# Patient Record
Sex: Male | Born: 1955
Health system: Southern US, Community
[De-identification: ages and names within clinical notes are randomized; demographics above are authoritative.]

## PROBLEM LIST (undated history)

## (undated) ENCOUNTER — Ambulatory Visit: Discharge status: Absent without leave | Payer: 59

## (undated) DIAGNOSIS — G8929 Other chronic pain: Secondary | ICD-10-CM

## (undated) DIAGNOSIS — M5030 Other cervical disc degeneration, unspecified cervical region: Secondary | ICD-10-CM

## (undated) DIAGNOSIS — E119 Type 2 diabetes mellitus without complications: Secondary | ICD-10-CM

## (undated) DIAGNOSIS — F17200 Nicotine dependence, unspecified, uncomplicated: Secondary | ICD-10-CM

## (undated) DIAGNOSIS — Z6831 Body mass index (BMI) 31.0-31.9, adult: Secondary | ICD-10-CM

## (undated) DIAGNOSIS — I509 Heart failure, unspecified: Secondary | ICD-10-CM

## (undated) DIAGNOSIS — I739 Peripheral vascular disease, unspecified: Secondary | ICD-10-CM

## (undated) DIAGNOSIS — I2510 Atherosclerotic heart disease of native coronary artery without angina pectoris: Secondary | ICD-10-CM

## (undated) DIAGNOSIS — E6609 Other obesity due to excess calories: Secondary | ICD-10-CM

## (undated) DIAGNOSIS — E785 Hyperlipidemia, unspecified: Secondary | ICD-10-CM

## (undated) DIAGNOSIS — I10 Essential (primary) hypertension: Secondary | ICD-10-CM

## (undated) DIAGNOSIS — C801 Malignant (primary) neoplasm, unspecified: Secondary | ICD-10-CM

## (undated) HISTORY — PX: BREAST SURGERY: SHX581

## (undated) HISTORY — PX: KNEE SURGERY: SHX244

## (undated) HISTORY — PX: CARDIAC CATHETERIZATION: SHX172

## (undated) HISTORY — DX: Heart failure, unspecified: I50.9

## (undated) HISTORY — DX: Atherosclerotic heart disease of native coronary artery without angina pectoris: I25.10

## (undated) MED FILL — KCl 20 MEQ/L (0.15%) in NaCl 0.9% Inj: INTRAVENOUS | Qty: 1000 | Status: AC

---

## 2004-03-07 ENCOUNTER — Ambulatory Visit (HOSPITAL_COMMUNITY): Admission: RE | Admit: 2004-03-07 | Discharge: 2004-03-07 | Payer: Self-pay | Admitting: Family Medicine

## 2014-11-12 ENCOUNTER — Other Ambulatory Visit (HOSPITAL_COMMUNITY): Payer: Self-pay | Admitting: Physician Assistant

## 2014-11-12 ENCOUNTER — Ambulatory Visit (HOSPITAL_COMMUNITY)
Admission: RE | Admit: 2014-11-12 | Discharge: 2014-11-12 | Disposition: A | Discharge status: Home / Self Care | Payer: 59 | Source: Ambulatory Visit | Attending: Physician Assistant | Admitting: Physician Assistant

## 2014-11-12 DIAGNOSIS — M940 Chondrocostal junction syndrome [Tietze]: Secondary | ICD-10-CM

## 2014-11-12 DIAGNOSIS — Z0001 Encounter for general adult medical examination with abnormal findings: Secondary | ICD-10-CM

## 2015-05-30 ENCOUNTER — Encounter (INDEPENDENT_AMBULATORY_CARE_PROVIDER_SITE_OTHER): Payer: Self-pay | Admitting: *Deleted

## 2016-09-07 DIAGNOSIS — Z0001 Encounter for general adult medical examination with abnormal findings: Secondary | ICD-10-CM | POA: Diagnosis not present

## 2016-09-07 DIAGNOSIS — Z1389 Encounter for screening for other disorder: Secondary | ICD-10-CM | POA: Diagnosis not present

## 2016-09-07 DIAGNOSIS — E782 Mixed hyperlipidemia: Secondary | ICD-10-CM | POA: Diagnosis not present

## 2016-09-07 DIAGNOSIS — Z23 Encounter for immunization: Secondary | ICD-10-CM | POA: Diagnosis not present

## 2016-09-08 DIAGNOSIS — Z0001 Encounter for general adult medical examination with abnormal findings: Secondary | ICD-10-CM | POA: Diagnosis not present

## 2016-09-08 DIAGNOSIS — Z1389 Encounter for screening for other disorder: Secondary | ICD-10-CM | POA: Diagnosis not present

## 2016-09-08 DIAGNOSIS — Z23 Encounter for immunization: Secondary | ICD-10-CM | POA: Diagnosis not present

## 2018-05-12 DIAGNOSIS — J019 Acute sinusitis, unspecified: Secondary | ICD-10-CM | POA: Diagnosis not present

## 2018-05-12 DIAGNOSIS — Z1389 Encounter for screening for other disorder: Secondary | ICD-10-CM | POA: Diagnosis not present

## 2018-05-12 DIAGNOSIS — E663 Overweight: Secondary | ICD-10-CM | POA: Diagnosis not present

## 2018-05-12 DIAGNOSIS — Z6828 Body mass index (BMI) 28.0-28.9, adult: Secondary | ICD-10-CM | POA: Diagnosis not present

## 2018-05-12 DIAGNOSIS — B349 Viral infection, unspecified: Secondary | ICD-10-CM | POA: Diagnosis not present

## 2018-06-09 DIAGNOSIS — E663 Overweight: Secondary | ICD-10-CM | POA: Diagnosis not present

## 2018-06-09 DIAGNOSIS — Z0000 Encounter for general adult medical examination without abnormal findings: Secondary | ICD-10-CM | POA: Diagnosis not present

## 2018-06-09 DIAGNOSIS — R7309 Other abnormal glucose: Secondary | ICD-10-CM | POA: Diagnosis not present

## 2018-06-10 DIAGNOSIS — R7309 Other abnormal glucose: Secondary | ICD-10-CM | POA: Diagnosis not present

## 2018-06-10 DIAGNOSIS — Z6829 Body mass index (BMI) 29.0-29.9, adult: Secondary | ICD-10-CM | POA: Diagnosis not present

## 2018-06-10 DIAGNOSIS — Z0000 Encounter for general adult medical examination without abnormal findings: Secondary | ICD-10-CM | POA: Diagnosis not present

## 2019-09-08 ENCOUNTER — Other Ambulatory Visit: Payer: Self-pay

## 2019-09-08 ENCOUNTER — Ambulatory Visit
Admission: EM | Admit: 2019-09-08 | Discharge: 2019-09-08 | Disposition: A | Discharge status: Home / Self Care | Payer: Commercial Managed Care - PPO | Attending: Emergency Medicine | Admitting: Emergency Medicine

## 2019-09-08 DIAGNOSIS — Z20822 Contact with and (suspected) exposure to covid-19: Secondary | ICD-10-CM

## 2019-09-08 LAB — POC SARS CORONAVIRUS 2 AG -  ED: SARS Coronavirus 2 Ag: NEGATIVE

## 2019-09-08 NOTE — ED Triage Notes (Signed)
Pt presents with headache and body aches that began yesterday  After positive exposure

## 2019-09-08 NOTE — ED Provider Notes (Signed)
[facility]    CSN: [id number] Arrival date & time: 09/08/19  1522      History   Chief Complaint Chief Complaint  Patient presents with  . Nasal Congestion    HPI REMER STANGO is a 64 y.o. male.    presents to the urgent care with a complaint of headache and body ache for the past 2 days.  Reported positive Covid exposure.  Denies sick exposure to COVID, flu or strep.  Denies recent travel.  Denies aggravating or alleviating symptoms.  Denies previous COVID infection.   Denies fever, chills, fatigue, nasal congestion, rhinorrhea, sore throat, cough, SOB, wheezing, chest pain, nausea, vomiting, changes in bowel or bladder habits.      History reviewed. No pertinent past medical history.  There are no problems to display for this patient.   History reviewed. No pertinent surgical history.     Home Medications    Prior to Admission medications   Not on File    Family History Family History  Problem Relation Age of Onset  . CAD Mother     Social History Social History   Tobacco Use  . Smoking status: Current Every Day Smoker    Packs/day: 1.00    Types: Cigarettes  . Smokeless tobacco: Never Used  Substance Use Topics  . Alcohol use: Not Currently  . Drug use: Not Currently     Allergies   Patient has no known allergies.   Review of Systems Review of Systems  Constitutional: Negative.   HENT: Negative.   Respiratory: Negative.   Cardiovascular: Negative.   Gastrointestinal: Negative.   Musculoskeletal:       Body ache  Neurological: Positive for headaches.  All other systems reviewed and are negative.    Physical Exam Triage Vital Signs ED Triage Vitals  Enc Vitals Group     BP      Pulse      Resp      Temp      Temp src      SpO2      Weight      Height      Head Circumference      Peak Flow      Pain Score      Pain Loc      Pain Edu?      Excl. in [place]?    No data found.  Updated Vital Signs BP (!) 164/90    Pulse 90   Temp 98.1 F (36.7 C)   Resp 20   SpO2 93%   Visual Acuity Right Eye Distance:   Left Eye Distance:   Bilateral Distance:    Right Eye Near:   Left Eye Near:    Bilateral Near:     Physical Exam Vitals and nursing note reviewed.  Constitutional:      General: He is not in acute distress.    Appearance: Normal appearance. He is normal weight. He is not ill-appearing or toxic-appearing.  HENT:     Head: Normocephalic.     Right Ear: Tympanic membrane, ear canal and external ear normal. There is no impacted cerumen.     Left Ear: Tympanic membrane, ear canal and external ear normal. There is no impacted cerumen.     Nose: Nose normal. No congestion.     Mouth/Throat:     Mouth: Mucous membranes are moist.     Pharynx: Oropharynx is clear. No oropharyngeal exudate or posterior oropharyngeal erythema.  Cardiovascular:  Rate and Rhythm: Normal rate and regular rhythm.     Pulses: Normal pulses.     Heart sounds: Normal heart sounds. No murmur.  Pulmonary:     Effort: Pulmonary effort is normal. No respiratory distress.     Breath sounds: Normal breath sounds. No wheezing or rhonchi.  Chest:     Chest wall: No tenderness.  Abdominal:     General: Abdomen is flat. Bowel sounds are normal. There is no distension.     Palpations: There is no mass.     Tenderness: There is no abdominal tenderness.  Skin:    Capillary Refill: Capillary refill takes less than 2 seconds.  Neurological:     General: No focal deficit present.     Mental Status: He is alert and oriented to person, place, and time.      UC Treatments / Results  Labs (all labs ordered are listed, but only abnormal results are displayed) Labs Reviewed  POC SARS CORONAVIRUS 2 AG -  ED    EKG   Radiology No results found.  Procedures Procedures (including critical care time)  Medications Ordered in UC Medications - No data to display  Initial Impression / Assessment and Plan / UC Course   I have reviewed the triage vital signs and the nursing notes.  Pertinent labs & imaging results that were available during my care of the patient were reviewed by me and considered in my medical decision making (see chart for details).    POC COVID-19 test was ordered.  Result was negative. [facility] COVID-19 test was ordered Advised patient to quarantine.   To go to ED for worsening of symptoms. Patient verbalized understanding plan of care.  Final Clinical Impressions(s) / UC Diagnoses   Final diagnoses:  Suspected COVID-19 virus infection     Discharge Instructions     COVID testing ordered.  It will take between 2-7 days for test results.  Someone will contact you regarding abnormal results.    In the meantime: You should remain isolated in your home for 10 days from symptom onset AND greater than 72 hours after symptoms resolution (absence of fever without the use of fever-reducing medication and improvement in respiratory symptoms), whichever is longer Get plenty of rest and push fluids Use medications daily for symptom relief Use OTC medications like ibuprofen or tylenol as needed fever or pain Call or go to the ED if you have any new or worsening symptoms such as fever, worsening cough, shortness of breath, chest tightness, chest pain, turning blue, changes in mental status, etc...     ED Prescriptions    None     PDMP not reviewed this encounter.   Emerson Monte, [place] 09/08/19 [phone]

## 2019-09-08 NOTE — Discharge Instructions (Addendum)
POC COVID-19 test was negative COVID testing ordered.  It will take between 2-7 days for test results.  Someone will contact you regarding abnormal results.    In the meantime: You should remain isolated in your home for 10 days from symptom onset AND greater than 72 hours after symptoms resolution (absence of fever without the use of fever-reducing medication and improvement in respiratory symptoms), whichever is longer Get plenty of rest and push fluids Use medications daily for symptom relief Use OTC medications like ibuprofen or tylenol as needed fever or pain Call or go to the ED if you have any new or worsening symptoms such as fever, worsening cough, shortness of breath, chest tightness, chest pain, turning blue, changes in mental status, etc..Marland Kitchen

## 2019-09-09 LAB — NOVEL CORONAVIRUS, NAA: SARS-CoV-2, NAA: NOT DETECTED

## 2019-09-12 ENCOUNTER — Telehealth (HOSPITAL_COMMUNITY): Payer: Self-pay | Admitting: Emergency Medicine

## 2019-09-12 NOTE — Telephone Encounter (Signed)
Pt called requesting covid test results. Results reported as negative.

## 2020-07-08 ENCOUNTER — Ambulatory Visit
Admission: EM | Admit: 2020-07-08 | Discharge: 2020-07-08 | Disposition: A | Discharge status: Home / Self Care | Payer: Commercial Managed Care - PPO

## 2020-07-08 DIAGNOSIS — Z1152 Encounter for screening for COVID-19: Secondary | ICD-10-CM

## 2020-07-08 DIAGNOSIS — J069 Acute upper respiratory infection, unspecified: Secondary | ICD-10-CM

## 2020-07-08 MED ORDER — BENZONATATE 100 MG PO CAPS: 100.0000 mg | ORAL_CAPSULE | Freq: Three times a day (TID) | ORAL | 0 refills | Status: DC | PRN

## 2020-07-08 MED ORDER — CETIRIZINE HCL 10 MG PO TABS: 10.0000 mg | ORAL_TABLET | Freq: Every day | ORAL | 0 refills | Status: DC

## 2020-07-08 MED ORDER — FLUTICASONE PROPIONATE 50 MCG/ACT NA SUSP: 1.0000 | Freq: Every day | NASAL | 0 refills | Status: DC

## 2020-07-08 MED ORDER — DEXAMETHASONE 4 MG PO TABS: 4.0000 mg | ORAL_TABLET | Freq: Every day | ORAL | 0 refills | Status: AC

## 2020-07-08 NOTE — ED Triage Notes (Signed)
Pt presents with cough and nasal congestion , pt also has c/o body aches

## 2020-07-08 NOTE — Discharge Instructions (Addendum)
COVID-19, RSV, flu A/B testing ordered.  It will take between 2-7 days for test results.  Someone will contact you regarding abnormal results.    In the meantime: You should remain isolated in your home for 10 days from symptom onset AND greater than 24 hours after symptoms resolution (absence of fever without the use of fever-reducing medication and improvement in respiratory symptoms), whichever is longer Get plenty of rest and push fluids Tessalon Perles prescribed for cough Zyrtec for nasal congestion, runny nose, and/or sore throat Flonase for nasal congestion and runny nose Use medications daily for symptom relief Use OTC medications like ibuprofen or tylenol as needed fever or pain Call or go to the ED if you have any new or worsening symptoms such as fever, worsening cough, shortness of breath, chest tightness, chest pain, turning blue, changes in mental status, etc..Marland Kitchen

## 2020-07-08 NOTE — ED Provider Notes (Signed)
[place]   [id number] 07/08/20 Arrival Time: 0712   CC: COVID symptoms  SUBJECTIVE: History from: patient and family.  Jonathan Keith is a 64 y.o. male who presents to the urgent care for complaint of cough, nasal congestion and body ache for the past few days.  Denies sick exposure to COVID, flu or strep.  Denies recent travel.  Has tried OTC medication without relief.  Denies aggravating factors.  Denies  previous symptoms in the past.   Denies fever, chills, fatigue, sinus pain, rhinorrhea, sore throat, SOB, wheezing, chest pain, nausea, changes in bowel or bladder habits.    ROS: As per HPI.  All other pertinent ROS negative.     History reviewed. No pertinent past medical history. History reviewed. No pertinent surgical history. No Known Allergies No current facility-administered medications on file prior to encounter.   Current Outpatient Medications on File Prior to Encounter  Medication Sig Dispense Refill   metFORMIN (GLUCOPHAGE-XR) 500 MG 24 hr tablet Take 500 mg by mouth daily.     pravastatin (PRAVACHOL) 20 MG tablet Take 20 mg by mouth daily.     Social History   Socioeconomic History   Marital status: Married    Spouse name: Not on file   Number of children: Not on file   Years of education: Not on file   Highest education level: Not on file  Occupational History   Not on file  Tobacco Use   Smoking status: Current Every Day Smoker    Packs/day: 1.00    Types: Cigarettes   Smokeless tobacco: Never Used  Substance and Sexual Activity   Alcohol use: Not Currently   Drug use: Not Currently   Sexual activity: Not on file  Other Topics Concern   Not on file  Social History Narrative   Not on file   Social Determinants of Health   Financial Resource Strain:    Difficulty of Paying Living Expenses: Not on file  Food Insecurity:    Worried About [place] in the Last Year: Not on file   Ran Out of Food in the  Last Year: Not on file  Transportation Needs:    Lack of Transportation (Medical): Not on file   Lack of Transportation (Non-Medical): Not on file  Physical Activity:    Days of Exercise per Week: Not on file   Minutes of Exercise per Session: Not on file  Stress:    Feeling of Stress : Not on file  Social Connections:    Frequency of Communication with Friends and Family: Not on file   Frequency of Social Gatherings with Friends and Family: Not on file   Attends Religious Services: Not on file   Active Member of Clubs or Organizations: Not on file   Attends Archivist Meetings: Not on file   Marital Status: Not on file  Intimate Partner Violence:    Fear of Current or Ex-Partner: Not on file   Emotionally Abused: Not on file   Physically Abused: Not on file   Sexually Abused: Not on file   Family History  Problem Relation Age of Onset   CAD Mother     OBJECTIVE:  Vitals:   07/08/20 0828  BP: (!) 146/83  Pulse: 84  Resp: 16  Temp: 97.8 F (36.6 C)  SpO2: 90%     General appearance: alert; appears fatigued, but nontoxic; speaking in full sentences and tolerating own secretions HEENT: NCAT; Ears: EACs clear, TMs pearly  gray; Eyes: PERRL.  EOM grossly intact. Sinuses: nontender; Nose: nares patent without rhinorrhea, Throat: oropharynx clear, tonsils non erythematous or enlarged, uvula midline  Neck: supple without LAD Lungs: unlabored respirations, symmetrical air entry; cough: moderate; no respiratory distress; CTAB Heart: regular rate and rhythm.  Radial pulses 2+ symmetrical bilaterally Skin: warm and dry Psychological: alert and cooperative; normal mood and affect  LABS:  No results found for this or any previous visit (from the past 24 hour(s)).   ASSESSMENT & PLAN:  1. Encounter for screening for COVID-19   2. URI with cough and congestion     Meds ordered this encounter  Medications   benzonatate (TESSALON) 100 MG capsule     Sig: Take 1 capsule (100 mg total) by mouth 3 (three) times daily as needed for cough.    Dispense:  30 capsule    Refill:  0   fluticasone (FLONASE) 50 MCG/ACT nasal spray    Sig: Place 1 spray into both nostrils daily for 14 days.    Dispense:  16 g    Refill:  0   cetirizine (ZYRTEC ALLERGY) 10 MG tablet    Sig: Take 1 tablet (10 mg total) by mouth daily.    Dispense:  30 tablet    Refill:  0   dexamethasone (DECADRON) 4 MG tablet    Sig: Take 1 tablet (4 mg total) by mouth daily for 7 days.    Dispense:  7 tablet    Refill:  0    Discharge Instructions    COVID-19, RSV, flu A/B testing ordered.  It will take between 2-7 days for test results.  Someone will contact you regarding abnormal results.    In the meantime: You should remain isolated in your home for 10 days from symptom onset AND greater than 24 hours after symptoms resolution (absence of fever without the use of fever-reducing medication and improvement in respiratory symptoms), whichever is longer Get plenty of rest and push fluids Tessalon Perles prescribed for cough Zyrtec for nasal congestion, runny nose, and/or sore throat Flonase for nasal congestion and runny nose Use medications daily for symptom relief Use OTC medications like ibuprofen or tylenol as needed fever or pain Call or go to the ED if you have any new or worsening symptoms such as fever, worsening cough, shortness of breath, chest tightness, chest pain, turning blue, changes in mental status, etc...   Reviewed expectations re: course of current medical issues. Questions answered. Outlined signs and symptoms indicating need for more acute intervention. Patient verbalized understanding. After Visit Summary given.         Emerson Monte, [place] 07/08/20 [phone]

## 2020-07-10 LAB — COVID-19, FLU A+B AND RSV
Influenza A, NAA: NOT DETECTED
Influenza B, NAA: NOT DETECTED
RSV, NAA: NOT DETECTED
SARS-CoV-2, NAA: NOT DETECTED

## 2020-09-30 ENCOUNTER — Other Ambulatory Visit: Payer: Self-pay | Admitting: Physician Assistant

## 2020-09-30 ENCOUNTER — Other Ambulatory Visit (HOSPITAL_COMMUNITY): Payer: Self-pay | Admitting: Physician Assistant

## 2020-09-30 DIAGNOSIS — M79604 Pain in right leg: Secondary | ICD-10-CM

## 2020-09-30 DIAGNOSIS — M79605 Pain in left leg: Secondary | ICD-10-CM

## 2020-10-10 ENCOUNTER — Ambulatory Visit (HOSPITAL_COMMUNITY)
Admission: RE | Admit: 2020-10-10 | Discharge: 2020-10-10 | Disposition: A | Discharge status: Home / Self Care | Payer: Commercial Managed Care - PPO | Source: Ambulatory Visit | Attending: Physician Assistant | Admitting: Physician Assistant

## 2020-10-10 DIAGNOSIS — M79604 Pain in right leg: Secondary | ICD-10-CM

## 2020-10-10 DIAGNOSIS — M79605 Pain in left leg: Secondary | ICD-10-CM | POA: Diagnosis present

## 2020-10-11 ENCOUNTER — Ambulatory Visit (HOSPITAL_COMMUNITY)
Admission: RE | Admit: 2020-10-11 | Discharge: 2020-10-11 | Disposition: A | Discharge status: Home / Self Care | Payer: Commercial Managed Care - PPO | Source: Ambulatory Visit | Attending: Physician Assistant | Admitting: Physician Assistant

## 2020-10-11 ENCOUNTER — Other Ambulatory Visit: Payer: Self-pay

## 2020-10-11 DIAGNOSIS — M79605 Pain in left leg: Secondary | ICD-10-CM | POA: Diagnosis present

## 2020-10-11 DIAGNOSIS — M79604 Pain in right leg: Secondary | ICD-10-CM | POA: Diagnosis not present

## 2020-10-21 ENCOUNTER — Other Ambulatory Visit: Payer: Self-pay

## 2020-10-21 ENCOUNTER — Ambulatory Visit: Payer: Commercial Managed Care - PPO | Admitting: Vascular Surgery

## 2020-10-21 ENCOUNTER — Encounter: Payer: Self-pay | Admitting: Vascular Surgery

## 2020-10-21 VITALS — BP 173/97 | HR 99 | Temp 97.9°F | Resp 16 | Ht 73.0 in | Wt 206.0 lb

## 2020-10-21 DIAGNOSIS — I70213 Atherosclerosis of native arteries of extremities with intermittent claudication, bilateral legs: Secondary | ICD-10-CM

## 2020-10-21 NOTE — Progress Notes (Signed)
[facility] [place]  Patient name: Jonathan Keith MRN: [id number] DOB: [birth year] Sex: male  REASON FOR CONSULT: Evaluation of limiting intermittent claudication both lower extremities  HPI: Jonathan Keith is a 65 y.o. male, who is here today for evaluation.  He is here today with his wife.  He reports a progressively severe intermittent claudication over the past year.  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  He reports this is become very difficult for him to do his job related to this.  He reports that his legs "lock up" with prolonged walking and he has to stop to get relief and then walk again.  He does not have any lower extremity tissue loss.  He reports that the pain is mainly in his calves but also can extend into his thighs and buttocks if he progressively pushes his walking.  He does report some nocturnal cramping as well and explained that this is not related arterial insufficiency.  He is a long-term cigarette smoker.  He reports that he is attempted to stop on many occasions with multiple modalities but has been unsuccessful  History reviewed. No pertinent past medical history.  Family History  Problem Relation Age of Onset  . CAD Mother     SOCIAL HISTORY: Social History   Socioeconomic History  . Marital status: Married    Spouse name: Not on file  . Number of children: Not on file  . Years of education: Not on file  . Highest education level: Not on file  Occupational History  . Not on file  Tobacco Use  . Smoking status: Current Every Day Smoker    Packs/day: 1.00    Types: Cigarettes  . Smokeless tobacco: Never Used  Substance and Sexual Activity  . Alcohol use: Not Currently  . Drug use: Not Currently  . Sexual activity: Not on file  Other Topics Concern  . Not on file  Social History Narrative  . Not on file   Social Determinants of Health   Financial Resource  Strain: Not on file  Food Insecurity: Not on file  Transportation Needs: Not on file  Physical Activity: Not on file  Stress: Not on file  Social Connections: Not on file  Intimate Partner Violence: Not on file    No Known Allergies  Current Outpatient Medications  Medication Sig Dispense Refill  . Calcium Carb-Cholecalciferol (SUPER CALCIUM 600 + D 400 PO) Take by mouth.    . dextromethorphan (TUSSIN COUGH) 15 MG/5ML syrup Take 10 mLs by mouth daily.    . metFORMIN (GLUCOPHAGE-XR) 500 MG 24 hr tablet Take 500 mg by mouth daily.    . Omega-3 Fatty Acids (FISH OIL) 1000 MG CPDR Take 2 g by mouth in the morning and at bedtime.    . pravastatin (PRAVACHOL) 20 MG tablet Take 20 mg by mouth daily.     No current facility-administered medications for this visit.    REVIEW OF SYSTEMS:  [X]  denotes positive finding, [ ]  denotes negative finding Cardiac  Comments:  Chest pain or chest pressure:    Shortness of breath upon exertion:    Short of breath when lying flat:    Irregular heart rhythm:        Vascular    Pain in calf, thigh, or hip brought on by ambulation: x   Pain in feet at night that wakes you up from your sleep:  x   Blood  clot in your veins:    Leg swelling:         Pulmonary    Oxygen at home:    Productive cough:     Wheezing:         Neurologic    Sudden weakness in arms or legs:     Sudden numbness in arms or legs:     Sudden onset of difficulty speaking or slurred speech:    Temporary loss of vision in one eye:     Problems with dizziness:         Gastrointestinal    Blood in stool:     Vomited blood:         Genitourinary    Burning when urinating:     Blood in urine:        Psychiatric    Major depression:         Hematologic    Bleeding problems:    Problems with blood clotting too easily:        Skin    Rashes or ulcers:        Constitutional    Fever or chills:      PHYSICAL EXAM: Vitals:   10/21/20 0831  BP: (!) 173/97  Pulse:  99  Resp: 16  Temp: 97.9 F (36.6 C)  TempSrc: Other (Comment)  SpO2: 95%  Weight: 206 lb (93.4 kg)  Height: 6\' 1"  (1.854 m)    GENERAL: The patient is a well-nourished male, in no acute distress. The vital signs are documented above. CARDIOVASCULAR: 2+ radial pulses bilaterally.  1+ femoral pulses bilaterally.  Absent popliteal and distal pulses bilaterally. PULMONARY: There is good air exchange  MUSCULOSKELETAL: There are no major deformities or cyanosis. NEUROLOGIC: No focal weakness or paresthesias are detected. SKIN: There are no ulcers or rashes noted. PSYCHIATRIC: The patient has a normal affect.  DATA:  Noninvasive studies from 10/10/2020 revealed the lower extremity duplex showing no evidence of DVT  Studies from 10/11/2020 were reviewed showing ankle arm index of 0.85 on the right and 0.70 on the left  MEDICAL ISSUES I discussed these findings and his history with the patient.  Explained that he does have a very straightforward claudication type symptoms related to his arterial insufficiency.  Explained that his symptoms are worse than we would typically see at his level of ankle arm indices but this is quite variable.  I explained that this is not limb threatening to him.  He is very limited to this both in his work and regular outside of work walking ability.  Explained the neck step would be arteriography for further evaluation.  Explained that if he had aortoiliac occlusive disease we would recommend stenting.  I explained the potential use of stenting for focal disease in his superficial femoral artery but explained the very limited long-term durability for extensive stenting in the superficial femoral artery level.  I did discuss femoral to popliteal bypass but certainly would reserve this for severe claudication or tissue loss.  I explained that blockages are related to his cigarette smoking and that if he was able to stop he would be able to walk further and also reduce his risk  for progressive disease.  He wishes to proceed with outpatient arteriography at [facility] and treatment if appropriate.  If endovascular treatment is not recommended, will discuss bypass surgery but in all likelihood would defer this.  Rosetta Posner, MD FACS [facility] Tel [phone]  [phone] Pager [phone]  Note: Portions of this report may have been transcribed using voice recognition software.  Every effort has been made to ensure accuracy; however, inadvertent computerized transcription errors may still be present.

## 2020-11-19 ENCOUNTER — Telehealth: Payer: Self-pay

## 2020-11-19 NOTE — Telephone Encounter (Signed)
Multiple unsuccessful attempts made to reach pt to schedule for an aortogram with bilateral runoff, possible intervention. Will mail pt an UTR letter to contact office to schedule.

## 2021-02-14 NOTE — Telephone Encounter (Signed)
Left message for patient to return call to inquire on intentions for scheduling procedure.

## 2021-02-24 ENCOUNTER — Encounter: Payer: Self-pay | Admitting: Internal Medicine

## 2021-02-24 ENCOUNTER — Other Ambulatory Visit: Payer: Self-pay

## 2021-02-24 ENCOUNTER — Encounter (HOSPITAL_COMMUNITY): Payer: Self-pay | Admitting: *Deleted

## 2021-02-24 ENCOUNTER — Emergency Department (HOSPITAL_COMMUNITY): Payer: Commercial Managed Care - PPO

## 2021-02-24 ENCOUNTER — Inpatient Hospital Stay (HOSPITAL_COMMUNITY)
Admission: EM | Admit: 2021-02-24 | Discharge: 2021-02-28 | DRG: 435 | Disposition: A | Discharge status: Home / Self Care | Payer: Commercial Managed Care - PPO | Source: Ambulatory Visit | Attending: Internal Medicine | Admitting: Internal Medicine

## 2021-02-24 DIAGNOSIS — E1165 Type 2 diabetes mellitus with hyperglycemia: Secondary | ICD-10-CM | POA: Diagnosis present

## 2021-02-24 DIAGNOSIS — E119 Type 2 diabetes mellitus without complications: Secondary | ICD-10-CM

## 2021-02-24 DIAGNOSIS — K8050 Calculus of bile duct without cholangitis or cholecystitis without obstruction: Secondary | ICD-10-CM

## 2021-02-24 DIAGNOSIS — Z20822 Contact with and (suspected) exposure to covid-19: Secondary | ICD-10-CM | POA: Diagnosis present

## 2021-02-24 DIAGNOSIS — K831 Obstruction of bile duct: Secondary | ICD-10-CM | POA: Diagnosis present

## 2021-02-24 DIAGNOSIS — C221 Intrahepatic bile duct carcinoma: Principal | ICD-10-CM | POA: Diagnosis present

## 2021-02-24 DIAGNOSIS — Z79899 Other long term (current) drug therapy: Secondary | ICD-10-CM | POA: Diagnosis not present

## 2021-02-24 DIAGNOSIS — I10 Essential (primary) hypertension: Secondary | ICD-10-CM | POA: Diagnosis present

## 2021-02-24 DIAGNOSIS — E118 Type 2 diabetes mellitus with unspecified complications: Secondary | ICD-10-CM | POA: Insufficient documentation

## 2021-02-24 DIAGNOSIS — R7989 Other specified abnormal findings of blood chemistry: Secondary | ICD-10-CM | POA: Diagnosis not present

## 2021-02-24 DIAGNOSIS — R17 Unspecified jaundice: Secondary | ICD-10-CM | POA: Diagnosis not present

## 2021-02-24 DIAGNOSIS — Z8249 Family history of ischemic heart disease and other diseases of the circulatory system: Secondary | ICD-10-CM

## 2021-02-24 DIAGNOSIS — K2960 Other gastritis without bleeding: Secondary | ICD-10-CM | POA: Diagnosis present

## 2021-02-24 DIAGNOSIS — F17200 Nicotine dependence, unspecified, uncomplicated: Secondary | ICD-10-CM | POA: Diagnosis present

## 2021-02-24 DIAGNOSIS — F17210 Nicotine dependence, cigarettes, uncomplicated: Secondary | ICD-10-CM | POA: Diagnosis present

## 2021-02-24 DIAGNOSIS — Z7984 Long term (current) use of oral hypoglycemic drugs: Secondary | ICD-10-CM | POA: Diagnosis not present

## 2021-02-24 DIAGNOSIS — E6609 Other obesity due to excess calories: Secondary | ICD-10-CM | POA: Insufficient documentation

## 2021-02-24 DIAGNOSIS — E782 Mixed hyperlipidemia: Secondary | ICD-10-CM | POA: Insufficient documentation

## 2021-02-24 DIAGNOSIS — E785 Hyperlipidemia, unspecified: Secondary | ICD-10-CM | POA: Diagnosis present

## 2021-02-24 DIAGNOSIS — R030 Elevated blood-pressure reading, without diagnosis of hypertension: Secondary | ICD-10-CM | POA: Insufficient documentation

## 2021-02-24 HISTORY — DX: Nicotine dependence, unspecified, uncomplicated: F17.200

## 2021-02-24 HISTORY — DX: Other obesity due to excess calories: Z68.31

## 2021-02-24 HISTORY — DX: Essential (primary) hypertension: I10

## 2021-02-24 HISTORY — DX: Other cervical disc degeneration, unspecified cervical region: M50.30

## 2021-02-24 HISTORY — DX: Hyperlipidemia, unspecified: E78.5

## 2021-02-24 HISTORY — DX: Peripheral vascular disease, unspecified: I73.9

## 2021-02-24 HISTORY — DX: Type 2 diabetes mellitus without complications: E11.9

## 2021-02-24 HISTORY — DX: Other obesity due to excess calories: E66.09

## 2021-02-24 HISTORY — DX: Other chronic pain: G89.29

## 2021-02-24 LAB — COMPREHENSIVE METABOLIC PANEL
ALT: 230 U/L — ABNORMAL HIGH (ref 0–44)
AST: 111 U/L — ABNORMAL HIGH (ref 15–41)
Albumin: 3.2 g/dL — ABNORMAL LOW (ref 3.5–5.0)
Alkaline Phosphatase: 355 U/L — ABNORMAL HIGH (ref 38–126)
Anion gap: 8 (ref 5–15)
BUN: 20 mg/dL (ref 8–23)
CO2: 22 mmol/L (ref 22–32)
Calcium: 9.4 mg/dL (ref 8.9–10.3)
Chloride: 100 mmol/L (ref 98–111)
Creatinine, Ser: 0.57 mg/dL — ABNORMAL LOW (ref 0.61–1.24)
GFR, Estimated: >60 mL/min (ref >60)
Glucose, Bld: 194 mg/dL — ABNORMAL HIGH (ref 70–99)
Potassium: 4.1 mmol/L (ref 3.5–5.1)
Sodium: 130 mmol/L — ABNORMAL LOW (ref 135–145)
Total Bilirubin: 18 mg/dL — ABNORMAL HIGH (ref 0.3–1.2)
Total Protein: 7.1 g/dL (ref 6.5–8.1)

## 2021-02-24 LAB — CBC WITH DIFFERENTIAL/PLATELET
Abs Immature Granulocytes: 0.03 10*3/uL (ref 0.00–0.07)
Basophils Absolute: 0.1 10*3/uL (ref 0.0–0.1)
Basophils Relative: 1 %
Eosinophils Absolute: 0.4 10*3/uL (ref 0.0–0.5)
Eosinophils Relative: 6 %
HCT: 39.5 % (ref 39.0–52.0)
Hemoglobin: 13.3 g/dL (ref 13.0–17.0)
Immature Granulocytes: 0 %
Lymphocytes Relative: 27 %
Lymphs Abs: 2.1 10*3/uL (ref 0.7–4.0)
MCH: 31.6 pg (ref 26.0–34.0)
MCHC: 33.7 g/dL (ref 30.0–36.0)
MCV: 93.8 fL (ref 80.0–100.0)
Monocytes Absolute: 0.8 10*3/uL (ref 0.1–1.0)
Monocytes Relative: 11 %
Neutro Abs: 4.3 10*3/uL (ref 1.7–7.7)
Neutrophils Relative %: 55 %
Platelets: 183 10*3/uL (ref 150–400)
RBC: 4.21 MIL/uL — ABNORMAL LOW (ref 4.22–5.81)
RDW: 14.2 % (ref 11.5–15.5)
WBC: 7.7 10*3/uL (ref 4.0–10.5)
nRBC: 0 % (ref 0.0–0.2)

## 2021-02-24 LAB — I-STAT CHEM 8, ED
BUN: 19 mg/dL (ref 8–23)
Calcium, Ion: 1.22 mmol/L (ref 1.15–1.40)
Chloride: 105 mmol/L (ref 98–111)
Creatinine, Ser: 0.9 mg/dL (ref 0.61–1.24)
Glucose, Bld: 196 mg/dL — ABNORMAL HIGH (ref 70–99)
HCT: 44 % (ref 39.0–52.0)
Hemoglobin: 15 g/dL (ref 13.0–17.0)
Potassium: 4.2 mmol/L (ref 3.5–5.1)
Sodium: 135 mmol/L (ref 135–145)
TCO2: 22 mmol/L (ref 22–32)

## 2021-02-24 LAB — HIV ANTIBODY (ROUTINE TESTING W REFLEX): HIV Screen 4th Generation wRfx: NONREACTIVE

## 2021-02-24 LAB — RESP PANEL BY RT-PCR (FLU A&B, COVID) ARPGX2
Influenza A by PCR: NEGATIVE
Influenza B by PCR: NEGATIVE
SARS Coronavirus 2 by RT PCR: NEGATIVE

## 2021-02-24 LAB — GLUCOSE, CAPILLARY: Glucose-Capillary: 258 mg/dL — ABNORMAL HIGH (ref 70–99)

## 2021-02-24 LAB — LIPASE, BLOOD: Lipase: 34 U/L (ref 11–51)

## 2021-02-24 LAB — HEMOGLOBIN A1C
Hgb A1c MFr Bld: 8.7 % — ABNORMAL HIGH (ref 4.8–5.6)
Mean Plasma Glucose: 202.99 mg/dL

## 2021-02-24 MED ORDER — PIPERACILLIN-TAZOBACTAM 3.375 G IVPB
3.3750 g | Freq: Three times a day (TID) | INTRAVENOUS | Status: DC
  Administered 2021-02-24 – 2021-02-28 (×11): 3.375 g via INTRAVENOUS
  Filled 2021-02-24 (×11): qty 50

## 2021-02-24 MED ORDER — INSULIN ASPART 100 UNIT/ML IJ SOLN
0.0000 [IU] | Freq: Every day | INTRAMUSCULAR | Status: DC
  Administered 2021-02-24 – 2021-02-25 (×2): 3 [IU] via SUBCUTANEOUS
  Administered 2021-02-27: 2 [IU] via SUBCUTANEOUS

## 2021-02-24 MED ORDER — ONDANSETRON HCL 4 MG/2ML IJ SOLN: 4.0000 mg | Freq: Four times a day (QID) | INTRAMUSCULAR | Status: DC | PRN

## 2021-02-24 MED ORDER — INSULIN ASPART 100 UNIT/ML IJ SOLN
0.0000 [IU] | Freq: Three times a day (TID) | INTRAMUSCULAR | Status: DC
  Administered 2021-02-25 – 2021-02-26 (×5): 3 [IU] via SUBCUTANEOUS
  Administered 2021-02-27: 2 [IU] via SUBCUTANEOUS
  Administered 2021-02-27: 4 [IU] via SUBCUTANEOUS
  Administered 2021-02-28: 5 [IU] via SUBCUTANEOUS

## 2021-02-24 MED ORDER — ACETAMINOPHEN 650 MG RE SUPP: 650.0000 mg | Freq: Four times a day (QID) | RECTAL | Status: DC | PRN

## 2021-02-24 MED ORDER — OXYCODONE HCL 5 MG PO TABS: 5.0000 mg | ORAL_TABLET | ORAL | Status: DC | PRN

## 2021-02-24 MED ORDER — SODIUM CHLORIDE 0.9 % IV SOLN
1.0000 g | Freq: Once | INTRAVENOUS | Status: AC
  Administered 2021-02-24: 1 g via INTRAVENOUS
  Filled 2021-02-24: qty 10

## 2021-02-24 MED ORDER — PIPERACILLIN-TAZOBACTAM 3.375 G IVPB: 3.3750 g | Freq: Three times a day (TID) | INTRAVENOUS | Status: DC

## 2021-02-24 MED ORDER — MORPHINE SULFATE (PF) 2 MG/ML IV SOLN: 2.0000 mg | INTRAVENOUS | Status: DC | PRN

## 2021-02-24 MED ORDER — ACETAMINOPHEN 325 MG PO TABS
650.0000 mg | ORAL_TABLET | Freq: Four times a day (QID) | ORAL | Status: DC | PRN
  Administered 2021-02-24: 650 mg via ORAL
  Filled 2021-02-24: qty 2

## 2021-02-24 MED ORDER — LACTATED RINGERS IV SOLN: INTRAVENOUS | Status: DC

## 2021-02-24 MED ORDER — HYDRALAZINE HCL 20 MG/ML IJ SOLN: 10.0000 mg | INTRAMUSCULAR | Status: DC | PRN

## 2021-02-24 MED ORDER — ONDANSETRON 4 MG PO TBDP: 4.0000 mg | ORAL_TABLET | Freq: Four times a day (QID) | ORAL | Status: DC | PRN

## 2021-02-24 MED ORDER — IOHEXOL 300 MG/ML  SOLN
100.0000 mL | Freq: Once | INTRAMUSCULAR | Status: AC | PRN
  Administered 2021-02-24: 100 mL via INTRAVENOUS

## 2021-02-24 NOTE — ED Provider Notes (Signed)
5:35 PM-received call back from GI, Dr. Abbey Chatters.  He states that his service will be able to do an ERCP, tomorrow morning.  He recommends admit to hospitalist.  He recommends starting Rocephin for possible cholecystitis.  5:36 PM-Consult complete with hospitalist. Patient case explained and discussed.  She agrees to admit patient for further evaluation and treatment. Call ended at 6:05 PM   Daleen Bo, MD 02/24/21 1815

## 2021-02-24 NOTE — ED Provider Notes (Signed)
[facility] EMERGENCY DEPARTMENT Provider Note   CSN: [id number] Arrival date & time: 02/24/21  1128     History Chief Complaint  Patient presents with   Jaundice    Jonathan Keith is a 65 y.o. male.  Patient complains of weakness for 2 weeks and   he saw his doctor today who sent him to the emergency department because of jaundice called his family doctor today who stated that he is got jaundice in his eyes  The history is provided by the patient and medical records. No language interpreter was used.  Weakness Severity:  Moderate Onset quality:  Sudden Timing:  Constant Progression:  Worsening Chronicity:  New Context: not alcohol use   Relieved by:  Nothing Worsened by:  Nothing Associated symptoms: no abdominal pain, no chest pain, no cough, no diarrhea, no frequency, no headaches and no seizures       History reviewed. No pertinent past medical history.  There are no problems to display for this patient.   Past Surgical History:  Procedure Laterality Date   BREAST SURGERY Left    KNEE SURGERY Left    x2       Family History  Problem Relation Age of Onset   CAD Mother     Social History   Tobacco Use   Smoking status: Every Day    Packs/day: 1.00    Types: Cigarettes   Smokeless tobacco: Never  Vaping Use   Vaping Use: Never used  Substance Use Topics   Alcohol use: Not Currently    Comment: occasionally   Drug use: Not Currently    Home Medications Prior to Admission medications   Medication Sig Start Date End Date Taking? Authorizing Provider  Calcium Carb-Cholecalciferol (SUPER CALCIUM 600 + D 400 PO) Take by mouth.    [provider]  dextromethorphan (TUSSIN COUGH) 15 MG/5ML syrup Take 10 mLs by mouth daily.    [provider]  metFORMIN (GLUCOPHAGE-XR) 500 MG 24 hr tablet Take 500 mg by mouth daily. 06/16/20   [provider]  Omega-3 Fatty Acids (FISH OIL) 1000 MG CPDR Take 2 g by mouth in the morning and at  bedtime.    [provider]  pravastatin (PRAVACHOL) 20 MG tablet Take 20 mg by mouth daily. 06/28/20   [provider]    Allergies    Patient has no known allergies.  Review of Systems   Review of Systems  Constitutional:  Negative for appetite change and fatigue.  HENT:  Negative for congestion, ear discharge and sinus pressure.   Eyes:  Negative for discharge.  Respiratory:  Negative for cough.   Cardiovascular:  Negative for chest pain.  Gastrointestinal:  Negative for abdominal pain and diarrhea.  Genitourinary:  Negative for frequency and hematuria.  Musculoskeletal:  Negative for back pain.  Skin:  Negative for rash.  Neurological:  Positive for weakness. Negative for seizures and headaches.  Psychiatric/Behavioral:  Negative for hallucinations.    Physical Exam Updated Vital Signs BP 139/85   Pulse 86   Temp 98.3 F (36.8 C) (Oral)   Resp 18   Ht 5' 11.5" (1.816 m)   Wt 104.8 kg   SpO2 100%   BMI 31.77 kg/m   Physical Exam Vitals and nursing note reviewed.  Constitutional:      Appearance: He is well-developed.  HENT:     Head: Normocephalic.     Nose: Nose normal.  Eyes:     General: No scleral icterus.  Comments: Sclera icteric  Neck:     Thyroid: No thyromegaly.  Cardiovascular:     Rate and Rhythm: Normal rate and regular rhythm.     Heart sounds: No murmur heard.   No friction rub. No gallop.  Pulmonary:     Breath sounds: No stridor. No wheezing or rales.  Chest:     Chest wall: No tenderness.  Abdominal:     General: There is no distension.     Tenderness: There is no abdominal tenderness. There is no rebound.  Musculoskeletal:        General: Normal range of motion.     Cervical back: Neck supple.  Lymphadenopathy:     Cervical: No cervical adenopathy.  Skin:    Coloration: Skin is jaundiced.     Findings: No erythema or rash.  Neurological:     Mental Status: He is alert and oriented to person, place, and time.      Motor: No abnormal muscle tone.     Coordination: Coordination normal.  Psychiatric:        Behavior: Behavior normal.    ED Results / Procedures / Treatments   Labs (all labs ordered are listed, but only abnormal results are displayed) Labs Reviewed  CBC WITH DIFFERENTIAL/PLATELET - Abnormal; Notable for the following components:      Result Value   RBC 4.21 (*)    All other components within normal limits  COMPREHENSIVE METABOLIC PANEL - Abnormal; Notable for the following components:   Sodium 130 (*)    Glucose, Bld 194 (*)    Creatinine, Ser 0.57 (*)    Albumin 3.2 (*)    AST 111 (*)    ALT 230 (*)    Alkaline Phosphatase 355 (*)    Total Bilirubin 18.0 (*)    All other components within normal limits  I-STAT CHEM 8, ED - Abnormal; Notable for the following components:   Glucose, Bld 196 (*)    All other components within normal limits  LIPASE, BLOOD    EKG None  Radiology CT ABDOMEN PELVIS W CONTRAST  Result Date: 02/24/2021 CLINICAL DATA:  Abdominal distension, weakness, jaundice EXAM: CT ABDOMEN AND PELVIS WITH CONTRAST TECHNIQUE: Multidetector CT imaging of the abdomen and pelvis was performed using the standard protocol following bolus administration of intravenous contrast. CONTRAST:  147mL OMNIPAQUE IOHEXOL 300 MG/ML  SOLN COMPARISON:  None. FINDINGS: Lower chest: No acute pleural or parenchymal lung disease. Minimal bilateral lower lobe bronchiectasis. Hepatobiliary: There is marked intrahepatic and extrahepatic biliary duct dilation, with common bile duct measuring up to 10 mm in size. There are multiple rounded areas of high attenuation within the common hepatic duct and common bile duct, reference images 22, 23, 26. Largest area measures 6 mm. The gallbladder is moderately distended. Circumferential wall thickening and enhancement is seen within the mid aspect of the gallbladder. This irregular area of gallbladder wall thickening measures up to 7 mm. Differential  includes localized inflammatory change versus neoplasm. Pancreas: The pancreas enhances normally. Mild fatty atrophy of the pancreatic body and tail. No surrounding inflammatory changes. Spleen: Normal in size without focal abnormality. Adrenals/Urinary Tract: Adrenal glands are unremarkable. Kidneys are normal, without renal calculi, focal lesion, or hydronephrosis. Bladder is unremarkable. Stomach/Bowel: No bowel obstruction or ileus. Normal appendix right lower quadrant. Scattered diverticulosis throughout the colon. There is nonspecific wall thickening of the mid sigmoid colon without surrounding inflammation. This may reflect wall thickening due to under distension versus scarring from previous bouts of diverticulitis.  Vascular/Lymphatic: Aortic atherosclerosis. No enlarged abdominal or pelvic lymph nodes. Reproductive: Prostate is unremarkable. Other: No free fluid or free gas. Small fat containing umbilical hernia. No bowel herniation. Musculoskeletal: No acute or destructive bony lesions. Reconstructed images demonstrate no additional findings. IMPRESSION: 1. Intrahepatic and extrahepatic biliary duct dilation, with multiple small high attenuation obstructing foci within the common hepatic and common bile duct as above. The appearance would favor multiple obstructing common bile duct calculi. ERCP may be useful for further evaluation and decompression of the biliary system. 2. Moderate gallbladder distension, with focal circumferential wall thickening and enhancement within the body of the gallbladder. Differential diagnosis includes focal cholecystitis versus neoplasm. If further imaging is desired, dedicated liver MRI with and without contrast could be considered. 3. Diverticulosis of the sigmoid colon without evidence of acute diverticulitis. Nonspecific sigmoid colon wall thickening most consistent with scarring from previous bouts of diverticulitis. 4.  Aortic Atherosclerosis (ICD10-I70.0).  Electronically Signed   By: Randa Ngo M.D.   On: 02/24/2021 15:17    Procedures Procedures   Medications Ordered in ED Medications  iohexol (OMNIPAQUE) 300 MG/ML solution 100 mL (100 mLs Intravenous Contrast Given 02/24/21 1434)    ED Course  I have reviewed the triage vital signs and the nursing notes.  Pertinent labs & imaging results that were available during my care of the patient were reviewed by me and considered in my medical decision making (see chart for details).  Patient with jaundice.  CT scan shows intrahepatic and extrahepatic biliary dilatation.  Patient has multiple common bile duct calculi.  Patient also has distended gallbladder MDM Rules/Calculators/A&P                         Care left with my colleague Dr. Eulis Foster who is going to speak with GI  Final Clinical Impression(s) / ED Diagnoses Final diagnoses:  None    Rx / DC Orders ED Discharge Orders     None        Milton Ferguson, MD 02/27/21 1046

## 2021-02-24 NOTE — ED Notes (Signed)
Patient transported to CT 

## 2021-02-24 NOTE — Plan of Care (Signed)
  Problem: Education: Goal: Knowledge of General Education information will improve Description: Including pain rating scale, medication(s)/side effects and non-pharmacologic comfort measures Outcome: Progressing   Problem: Health Behavior/Discharge Planning: Goal: Ability to manage health-related needs will improve Outcome: Progressing   Problem: Clinical Measurements: Goal: Ability to maintain clinical measurements within normal limits will improve Outcome: Progressing Goal: Will remain free from infection Outcome: Progressing Goal: Diagnostic test results will improve Outcome: Progressing Goal: Respiratory complications will improve Outcome: Progressing Goal: Cardiovascular complication will be avoided Outcome: Progressing   Problem: Activity: Goal: Risk for activity intolerance will decrease Outcome: Progressing   Problem: Nutrition: Goal: Adequate nutrition will be maintained Outcome: Progressing   Problem: Pain Managment: Goal: General experience of comfort will improve Outcome: Progressing   Problem: Skin Integrity: Goal: Risk for impaired skin integrity will decrease Outcome: Progressing

## 2021-02-24 NOTE — Progress Notes (Signed)
Pharmacy Antibiotic Note  Jonathan Keith is a 65 y.o. male admitted on 02/24/2021 with  Intra-abdominal .  Pharmacy has been consulted for Zosyn dosing.  Plan: Zosyn 3.375g IV q8h (4 hour infusion). Monitor clinical progress, cultures/sensitivities, renal function, abx plan  Height: 5\' 11"  (180.3 cm) Weight: 85.9 kg (189 lb 6 oz) IBW/kg (Calculated) : 75.3  Temp (24hrs), Avg:98.2 F (36.8 C), Min:98.1 F (36.7 C), Max:98.3 F (36.8 C)  Recent Labs  Lab 02/24/21 1223 02/24/21 1233  WBC 7.7  --   CREATININE 0.57* 0.90    Estimated Creatinine Clearance: 87.2 mL/min (by C-G formula based on SCr of 0.9 mg/dL).    No Known Allergies  Antimicrobials this admission: 7/18 Rocephin x 1 7/18 Zosyn >>   Dose adjustments this admission:   Microbiology results:    Thank you for allowing [place] to participate in this patients care. Jens Som, PharmD 02/24/2021 8:05 PM  Please check [email or web address] for unit-specific pharmacy phone numbers.

## 2021-02-24 NOTE — H&P (Signed)
History and Physical    [name] BDZ:[id number] DOB: 1956-03-22 DOA: 02/24/2021  PCP: [name], PA-C Consultants:  Early - vascular Patient coming from:  Home - lives with significant other; NOK: Wife, [phone]  Chief Complaint: Jaundice  HPI: Jonathan Keith is a 65 y.o. male with medical history significant of claudication presenting with jaundice.  He started feeling bad and turning yellow.  They made an appointment with PCP on 7/27 and he couldn't wait.  They went to see Dr. Armandina Gemma - concerned about liver failure and sent him to the ER.  His urine didn't look right.  He had abdominal pain Saturday AM and hasn't wanted to eat/drink.  No vomiting.  Diarrhea x 1.    He was supposed to have arteriogram but was waiting for Medicare to start - it recently did but he hasn't followed up.     ED Course: Painless jaundice, ?weakness.  PCP sent him to the ER.  CT with probable gallstones causing biliary obstruction with abnormal-appearing GB.  Will have ERCP with Dr. Laural Golden tomorrow.  Given Rocephin x 1 for possible cholecystitis.  Review of Systems: As per HPI; otherwise review of systems reviewed and negative.   Ambulatory Status:  Ambulates without assistance  COVID Vaccine Status:  None  Past Medical History:  Diagnosis Date   Chronic pain    neck, back, knees   Class 1 obesity due to excess calories with body mass index (BMI) of 31.0 to 31.9 in adult    Claudication (HCC)    Diabetes mellitus (HCC)    Dyslipidemia    Hypertension    Tobacco dependence     Past Surgical History:  Procedure Laterality Date   BREAST SURGERY Left    KNEE SURGERY Left    x2    Social History   Socioeconomic History   Marital status: Married    Spouse name: Not on file   Number of children: Not on file   Years of education: Not on file   Highest education level: Not on file  Occupational History   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  Tobacco Use   Smoking status: Every  Day    Packs/day: 2.00    Years: 56.00    Pack years: 112.00    Types: Cigarettes   Smokeless tobacco: Never  Vaping Use   Vaping Use: Never used  Substance and Sexual Activity   Alcohol use: Yes    Comment: rare - 2 this last week but that was the first time in maybe 18 months   Drug use: Never   Sexual activity: Not on file  Other Topics Concern   Not on file  Social History Narrative   Not on file   Social Determinants of Health   Financial Resource Strain: Not on file  Food Insecurity: Not on file  Transportation Needs: Not on file  Physical Activity: Not on file  Stress: Not on file  Social Connections: Not on file  Intimate Partner Violence: Not on file    No Known Allergies  Family History  Problem Relation Age of Onset   CAD Mother    Cancer Neg Hx     Prior to Admission medications   Medication Sig Start Date End Date Taking? Authorizing Provider  Calcium Carb-Cholecalciferol (SUPER CALCIUM 600 + D 400 PO) Take by mouth.   Yes [provider]  dextromethorphan (TUSSIN COUGH) 15 MG/5ML syrup Take 10 mLs by mouth daily.   Yes [provider]  metFORMIN (GLUCOPHAGE-XR) 500 MG 24 hr tablet Take 500 mg by mouth daily. 06/16/20  Yes [provider]  Omega-3 Fatty Acids (FISH OIL) 1000 MG CPDR Take 2 g by mouth in the morning and at bedtime.   Yes [provider]  pravastatin (PRAVACHOL) 20 MG tablet Take 20 mg by mouth daily. 06/28/20  Yes [provider]    Physical Exam: Vitals:   02/24/21 1730 02/24/21 1800 02/24/21 1830 02/24/21 1905  BP: (!) 144/89 (!) 156/89 (!) 145/75 (!) 152/84  Pulse: 83 84 91 79  Resp: 18 16 16 19   Temp:    98.1 F (36.7 C)  TempSrc:    Oral  SpO2: 95% 95% 94% 98%  Weight:    85.9 kg  Height:    5\' 11"  (1.803 m)     General:  Appears calm and comfortable and is in NAD; he is quite jaundiced Eyes:  PERRL, EOMI, normal lids, iris; marked scleral icterus ENT:  grossly normal hearing,  lips & tongue, mmm; poor/absent dentition Neck:  no LAD, masses or thyromegaly Cardiovascular:  RRR, no m/r/g. 1+ LE edema.  Respiratory:   CTA bilaterally with no wheezes/rales/rhonchi.  Normal respiratory effort. Abdomen:  soft, NT, ND, protuberant with umbilical hernia but patient/family report that this is unchanged Skin:  diffuse and impressive jaundice Musculoskeletal:  grossly normal tone BUE/BLE, good ROM, no bony abnormality Psychiatric:  grossly normal mood and affect, speech fluent and appropriate, AOx3 Neurologic:  CN 2-12 grossly intact, moves all extremities in coordinated fashion    Radiological Exams on Admission: Independently reviewed - see discussion in A/P where applicable  CT ABDOMEN PELVIS W CONTRAST  Result Date: 02/24/2021 CLINICAL DATA:  Abdominal distension, weakness, jaundice EXAM: CT ABDOMEN AND PELVIS WITH CONTRAST TECHNIQUE: Multidetector CT imaging of the abdomen and pelvis was performed using the standard protocol following bolus administration of intravenous contrast. CONTRAST:  15mL OMNIPAQUE IOHEXOL 300 MG/ML  SOLN COMPARISON:  None. FINDINGS: Lower chest: No acute pleural or parenchymal lung disease. Minimal bilateral lower lobe bronchiectasis. Hepatobiliary: There is marked intrahepatic and extrahepatic biliary duct dilation, with common bile duct measuring up to 10 mm in size. There are multiple rounded areas of high attenuation within the common hepatic duct and common bile duct, reference images 22, 23, 26. Largest area measures 6 mm. The gallbladder is moderately distended. Circumferential wall thickening and enhancement is seen within the mid aspect of the gallbladder. This irregular area of gallbladder wall thickening measures up to 7 mm. Differential includes localized inflammatory change versus neoplasm. Pancreas: The pancreas enhances normally. Mild fatty atrophy of the pancreatic body and tail. No surrounding inflammatory changes. Spleen: Normal in  size without focal abnormality. Adrenals/Urinary Tract: Adrenal glands are unremarkable. Kidneys are normal, without renal calculi, focal lesion, or hydronephrosis. Bladder is unremarkable. Stomach/Bowel: No bowel obstruction or ileus. Normal appendix right lower quadrant. Scattered diverticulosis throughout the colon. There is nonspecific wall thickening of the mid sigmoid colon without surrounding inflammation. This may reflect wall thickening due to under distension versus scarring from previous bouts of diverticulitis. Vascular/Lymphatic: Aortic atherosclerosis. No enlarged abdominal or pelvic lymph nodes. Reproductive: Prostate is unremarkable. Other: No free fluid or free gas. Small fat containing umbilical hernia. No bowel herniation. Musculoskeletal: No acute or destructive bony lesions. Reconstructed images demonstrate no additional findings. IMPRESSION: 1. Intrahepatic and extrahepatic biliary duct dilation, with multiple small high attenuation obstructing foci within the common hepatic and common bile duct as above. The appearance would favor multiple obstructing  common bile duct calculi. ERCP may be useful for further evaluation and decompression of the biliary system. 2. Moderate gallbladder distension, with focal circumferential wall thickening and enhancement within the body of the gallbladder. Differential diagnosis includes focal cholecystitis versus neoplasm. If further imaging is desired, dedicated liver MRI with and without contrast could be considered. 3. Diverticulosis of the sigmoid colon without evidence of acute diverticulitis. Nonspecific sigmoid colon wall thickening most consistent with scarring from previous bouts of diverticulitis. 4.  Aortic Atherosclerosis (ICD10-I70.0). Electronically Signed   By: Randa Ngo M.D.   On: 02/24/2021 15:17    EKG: not done   Labs on Admission: I have personally reviewed the available labs and imaging studies at the time of the  admission.  Pertinent labs:   Na++ 130 Glucose 194 AP 355 AST 111/ALT 230/Bili 18 Unremarkable CBC   Assessment/Plan Principal Problem:   Obstructive jaundice Active Problems:   Diabetes mellitus (HCC)   Dyslipidemia   Hypertension   Tobacco dependence   Obstructive jaundice -Patient presenting with relatively acute onset of mostly painless jaundice -Bili is 18 -CT with intra- and extrahepatic ductal dilation which appears to be due to multiple obstructing common bile duct calculi -Also with apparent gallbladder disease - cholecystitis vs. Neoplasm -First step will be ERCP; GI has been notified of the patient and patient will be NPO after MN in anticipation of procedure by Dr. Laural Golden tomorrow -Will admit, as he is likely to remain in the hospital long enough to allow the inflammation to cool prior to surgery/ERCP -IVF hydration  -Pain control with morphine 2 mg q2h prn -Nausea control with Zofran -Will also consult surgery, as he is likely to need cholecystectomy prior to d/c -Empiric coverage with Zosyn for now, in case there is a component of acute cholecystitis contributing  DM -Will check A1c -hold Glucophage -Cover with moderate-scale SSI   HTN -He does not appear to be taking medications for this issue at this time   HLD  -Hold Pravachol in the setting of hepatic dysfunction  Tobacco dependence -Encourage cessation.   -This was discussed with the patient and should be reviewed on an ongoing basis.   -Patch ordered at patient request.     Note: This patient has been tested and is negative for the novel coronavirus COVID-19. The patient has NOT been vaccinated against COVID-19.   Level of care: Med-Surg DVT prophylaxis: SCDs Code Status:  Full - confirmed with patient/family (this was a source of discussion, as the patient felt strongly that he wanted to be DNR but his wife convinced him otherwise) Family Communication: Wife was present throughout  evaluation Disposition Plan:  The patient is from: home  Anticipated d/c is to: home without [facility] services  Anticipated d/c date will depend on clinical response to treatment, likely later this week or early next week  Patient is currently: acutely ill Consults called: GI; Surgery  Admission status:  Admit - It is my clinical opinion that admission to INPATIENT is reasonable and necessary because of the expectation that this patient will require hospital care that crosses at least 2 midnights to treat this condition based on the medical complexity of the problems presented.  Given the aforementioned information, the predictability of an adverse outcome is felt to be significant.    Karmen Bongo MD [facility]   How to contact the [facility] Attending or Consulting provider [place] or covering provider during after hours [place], for this patient?  Check the care  team in [facility] and look for a) attending/consulting [place] provider listed and b) the [facility] team listed Log into [email or web address] and use [place]'s universal password to access. If you do not have the password, please contact the hospital operator. Locate the [facility] provider you are looking for under [facility] and page to a number that you can be directly reached. If you still have difficulty reaching the provider, please page the [facility] (Director on Call) for the Hospitalists listed on amion for assistance.   02/24/2021, 7:55 PM

## 2021-02-24 NOTE — ED Triage Notes (Signed)
Pt states he started feeling weak and noticed his eyes were yellow and bright colored urine x 4 days ago; pt has never had this happen before

## 2021-02-25 ENCOUNTER — Inpatient Hospital Stay (HOSPITAL_COMMUNITY): Payer: Commercial Managed Care - PPO | Admitting: Certified Registered"

## 2021-02-25 ENCOUNTER — Other Ambulatory Visit: Payer: Self-pay

## 2021-02-25 ENCOUNTER — Encounter (HOSPITAL_COMMUNITY): Payer: Self-pay | Admitting: Internal Medicine

## 2021-02-25 ENCOUNTER — Inpatient Hospital Stay (HOSPITAL_COMMUNITY): Payer: Commercial Managed Care - PPO

## 2021-02-25 ENCOUNTER — Encounter (HOSPITAL_COMMUNITY)
Admission: EM | Disposition: A | Discharge status: Home / Self Care | Payer: Self-pay | Source: Home / Self Care | Attending: Internal Medicine

## 2021-02-25 DIAGNOSIS — K8050 Calculus of bile duct without cholangitis or cholecystitis without obstruction: Secondary | ICD-10-CM | POA: Diagnosis not present

## 2021-02-25 DIAGNOSIS — R7989 Other specified abnormal findings of blood chemistry: Secondary | ICD-10-CM

## 2021-02-25 DIAGNOSIS — K831 Obstruction of bile duct: Secondary | ICD-10-CM

## 2021-02-25 DIAGNOSIS — R17 Unspecified jaundice: Secondary | ICD-10-CM

## 2021-02-25 HISTORY — PX: BILIARY BRUSHING: SHX6843

## 2021-02-25 HISTORY — PX: BILIARY STENT PLACEMENT: SHX5538

## 2021-02-25 HISTORY — PX: ERCP: SHX5425

## 2021-02-25 HISTORY — PX: SPHINCTEROTOMY: SHX5544

## 2021-02-25 LAB — COMPREHENSIVE METABOLIC PANEL
ALT: 208 U/L — ABNORMAL HIGH (ref 0–44)
AST: 115 U/L — ABNORMAL HIGH (ref 15–41)
Albumin: 2.8 g/dL — ABNORMAL LOW (ref 3.5–5.0)
Alkaline Phosphatase: 318 U/L — ABNORMAL HIGH (ref 38–126)
Anion gap: 7 (ref 5–15)
BUN: 17 mg/dL (ref 8–23)
CO2: 23 mmol/L (ref 22–32)
Calcium: 9 mg/dL (ref 8.9–10.3)
Chloride: 99 mmol/L (ref 98–111)
Creatinine, Ser: 0.54 mg/dL — ABNORMAL LOW (ref 0.61–1.24)
GFR, Estimated: >60 mL/min (ref >60)
Glucose, Bld: 179 mg/dL — ABNORMAL HIGH (ref 70–99)
Potassium: 3.9 mmol/L (ref 3.5–5.1)
Sodium: 129 mmol/L — ABNORMAL LOW (ref 135–145)
Total Bilirubin: 17.6 mg/dL — ABNORMAL HIGH (ref 0.3–1.2)
Total Protein: 6.3 g/dL — ABNORMAL LOW (ref 6.5–8.1)

## 2021-02-25 LAB — CBC
HCT: 36.5 % — ABNORMAL LOW (ref 39.0–52.0)
Hemoglobin: 12.5 g/dL — ABNORMAL LOW (ref 13.0–17.0)
MCH: 32.6 pg (ref 26.0–34.0)
MCHC: 34.2 g/dL (ref 30.0–36.0)
MCV: 95.3 fL (ref 80.0–100.0)
Platelets: 194 10*3/uL (ref 150–400)
RBC: 3.83 MIL/uL — ABNORMAL LOW (ref 4.22–5.81)
RDW: 14.3 % (ref 11.5–15.5)
WBC: 6.4 10*3/uL (ref 4.0–10.5)
nRBC: 0 % (ref 0.0–0.2)

## 2021-02-25 LAB — GLUCOSE, CAPILLARY
Glucose-Capillary: 140 mg/dL — ABNORMAL HIGH (ref 70–99)
Glucose-Capillary: 195 mg/dL — ABNORMAL HIGH (ref 70–99)
Glucose-Capillary: 274 mg/dL — ABNORMAL HIGH (ref 70–99)

## 2021-02-25 SURGERY — ERCP, WITH INTERVENTION IF INDICATED
Anesthesia: General

## 2021-02-25 MED ORDER — PHENYLEPHRINE 40 MCG/ML (10ML) SYRINGE FOR IV PUSH (FOR BLOOD PRESSURE SUPPORT)
PREFILLED_SYRINGE | INTRAVENOUS | Status: AC
  Filled 2021-02-25: qty 10

## 2021-02-25 MED ORDER — LIDOCAINE HCL (PF) 2 % IJ SOLN
INTRAMUSCULAR | Status: AC
  Filled 2021-02-25: qty 5

## 2021-02-25 MED ORDER — FENTANYL CITRATE (PF) 100 MCG/2ML IJ SOLN
INTRAMUSCULAR | Status: AC
  Filled 2021-02-25: qty 2

## 2021-02-25 MED ORDER — GLUCAGON HCL RDNA (DIAGNOSTIC) 1 MG IJ SOLR
INTRAMUSCULAR | Status: AC
  Filled 2021-02-25: qty 2

## 2021-02-25 MED ORDER — FENTANYL CITRATE (PF) 100 MCG/2ML IJ SOLN
INTRAMUSCULAR | Status: DC | PRN
  Administered 2021-02-25 (×2): 50 ug via INTRAVENOUS

## 2021-02-25 MED ORDER — GLUCAGON HCL RDNA (DIAGNOSTIC) 1 MG IJ SOLR
INTRAMUSCULAR | Status: DC | PRN
  Administered 2021-02-25: .25 mg via INTRAVENOUS

## 2021-02-25 MED ORDER — HYDROMORPHONE HCL 1 MG/ML IJ SOLN: 0.2500 mg | INTRAMUSCULAR | Status: DC | PRN

## 2021-02-25 MED ORDER — MIDAZOLAM HCL 5 MG/5ML IJ SOLN
INTRAMUSCULAR | Status: DC | PRN
  Administered 2021-02-25: 2 mg via INTRAVENOUS

## 2021-02-25 MED ORDER — DEXAMETHASONE SODIUM PHOSPHATE 10 MG/ML IJ SOLN
INTRAMUSCULAR | Status: DC | PRN
  Administered 2021-02-25: 4 mg via INTRAVENOUS

## 2021-02-25 MED ORDER — ONDANSETRON HCL 4 MG/2ML IJ SOLN
INTRAMUSCULAR | Status: AC
  Filled 2021-02-25: qty 2

## 2021-02-25 MED ORDER — SODIUM CHLORIDE 0.9 % IV SOLN
INTRAVENOUS | Status: DC | PRN
  Administered 2021-02-25: 20 mL

## 2021-02-25 MED ORDER — SODIUM CHLORIDE 0.9 % IV SOLN: INTRAVENOUS | Status: DC

## 2021-02-25 MED ORDER — LIDOCAINE 2% (20 MG/ML) 5 ML SYRINGE
INTRAMUSCULAR | Status: DC | PRN
Start: 2021-02-25 — End: 2021-02-25
  Administered 2021-02-25: 80 mg via INTRAVENOUS

## 2021-02-25 MED ORDER — DEXAMETHASONE SODIUM PHOSPHATE 10 MG/ML IJ SOLN
INTRAMUSCULAR | Status: AC
  Filled 2021-02-25: qty 1

## 2021-02-25 MED ORDER — LACTATED RINGERS IV SOLN: INTRAVENOUS | Status: DC

## 2021-02-25 MED ORDER — GLYCOPYRROLATE PF 0.2 MG/ML IJ SOSY
PREFILLED_SYRINGE | INTRAMUSCULAR | Status: AC
  Filled 2021-02-25: qty 1

## 2021-02-25 MED ORDER — ONDANSETRON HCL 4 MG/2ML IJ SOLN
INTRAMUSCULAR | Status: DC | PRN
  Administered 2021-02-25: 4 mg via INTRAVENOUS

## 2021-02-25 MED ORDER — STERILE WATER FOR IRRIGATION IR SOLN
Status: DC | PRN
  Administered 2021-02-25: 1000 mL

## 2021-02-25 MED ORDER — ROCURONIUM BROMIDE 10 MG/ML (PF) SYRINGE
PREFILLED_SYRINGE | INTRAVENOUS | Status: AC
  Filled 2021-02-25: qty 10

## 2021-02-25 MED ORDER — SEVOFLURANE IN SOLN
RESPIRATORY_TRACT | Status: AC
  Filled 2021-02-25: qty 250

## 2021-02-25 MED ORDER — ROCURONIUM BROMIDE 10 MG/ML (PF) SYRINGE
PREFILLED_SYRINGE | INTRAVENOUS | Status: DC | PRN
  Administered 2021-02-25: 10 mg via INTRAVENOUS
  Administered 2021-02-25: 50 mg via INTRAVENOUS

## 2021-02-25 MED ORDER — GADOBUTROL 1 MMOL/ML IV SOLN
8.0000 mL | Freq: Once | INTRAVENOUS | Status: AC | PRN
  Administered 2021-02-25: 8 mL via INTRAVENOUS

## 2021-02-25 MED ORDER — GLYCOPYRROLATE PF 0.2 MG/ML IJ SOSY
PREFILLED_SYRINGE | INTRAMUSCULAR | Status: DC | PRN
  Administered 2021-02-25: .1 mg via INTRAVENOUS

## 2021-02-25 MED ORDER — ORAL CARE MOUTH RINSE: 15.0000 mL | Freq: Once | OROMUCOSAL | Status: DC

## 2021-02-25 MED ORDER — CHLORHEXIDINE GLUCONATE 0.12 % MT SOLN: 15.0000 mL | Freq: Once | OROMUCOSAL | Status: DC

## 2021-02-25 MED ORDER — PHENYLEPHRINE 40 MCG/ML (10ML) SYRINGE FOR IV PUSH (FOR BLOOD PRESSURE SUPPORT)
PREFILLED_SYRINGE | INTRAVENOUS | Status: DC | PRN
  Administered 2021-02-25: 120 ug via INTRAVENOUS
  Administered 2021-02-25 (×3): 80 ug via INTRAVENOUS

## 2021-02-25 MED ORDER — ONDANSETRON HCL 4 MG/2ML IJ SOLN: 4.0000 mg | Freq: Once | INTRAMUSCULAR | Status: DC | PRN

## 2021-02-25 MED ORDER — MIDAZOLAM HCL 2 MG/2ML IJ SOLN
INTRAMUSCULAR | Status: AC
  Filled 2021-02-25: qty 2

## 2021-02-25 MED ORDER — PROPOFOL 10 MG/ML IV BOLUS
INTRAVENOUS | Status: DC | PRN
  Administered 2021-02-25: 150 mg via INTRAVENOUS
  Administered 2021-02-25: 50 mg via INTRAVENOUS

## 2021-02-25 MED ORDER — PROPOFOL 10 MG/ML IV BOLUS
INTRAVENOUS | Status: AC
  Filled 2021-02-25: qty 20

## 2021-02-25 MED ORDER — SODIUM CHLORIDE 0.9 % IV SOLN
INTRAVENOUS | Status: AC
  Filled 2021-02-25: qty 100

## 2021-02-25 MED ORDER — SUGAMMADEX SODIUM 200 MG/2ML IV SOLN
INTRAVENOUS | Status: DC | PRN
  Administered 2021-02-25: 200 mg via INTRAVENOUS

## 2021-02-25 SURGICAL SUPPLY — 22 items
BALLN RETRIEVAL 12X15 (BALLOONS) IMPLANT
BASKET TRAPEZOID 3X6 (MISCELLANEOUS) IMPLANT
BASKET TRAPEZOID LITHO 2.0X5 (MISCELLANEOUS) IMPLANT
DEVICE INFLATION ENCORE 26 (MISCELLANEOUS) IMPLANT
DEVICE LOCKING W-BIOPSY CAP (MISCELLANEOUS) IMPLANT
GUIDEWIRE HYDRA JAGWIRE .35 (WIRE) IMPLANT
GUIDEWIRE JAG HINI 025X260CM (WIRE) IMPLANT
KIT ENDO PROCEDURE PEN (KITS) ×2 IMPLANT
KIT TURNOVER KIT A (KITS) ×2 IMPLANT
LUBRICANT JELLY 4.5OZ STERILE (MISCELLANEOUS) IMPLANT
PAD ARMBOARD 7.5X6 YLW CONV (MISCELLANEOUS) ×2 IMPLANT
POSITIONER HEAD 8X9X4 ADT (SOFTGOODS) IMPLANT
SCOPE SPY DS DISPOSABLE (MISCELLANEOUS) IMPLANT
SNARE ROTATE MED OVAL 20MM (MISCELLANEOUS) IMPLANT
SNARE SHORT THROW 13M SML OVAL (MISCELLANEOUS) IMPLANT
SPHINCTEROTOME AUTOTOME .25 (MISCELLANEOUS) IMPLANT
SPHINCTEROTOME HYDRATOME 44 (MISCELLANEOUS) IMPLANT
SYSTEM CONTINUOUS INJECTION (MISCELLANEOUS) IMPLANT
TUBING INSUFFLATOR CO2MPACT (TUBING) ×2 IMPLANT
WALLSTENT METAL COVERED 10X60 (STENTS) IMPLANT
WALLSTENT METAL COVERED 10X80 (STENTS) IMPLANT
WATER STERILE IRR 1000ML POUR (IV SOLUTION) ×2 IMPLANT

## 2021-02-25 NOTE — Anesthesia Preprocedure Evaluation (Signed)
Anesthesia Evaluation  Patient identified by MRN, date of birth, ID band Patient awake    Reviewed: Allergy & Precautions, NPO status , Patient's Chart, lab work & pertinent test results  History of Anesthesia Complications Negative for: history of anesthetic complications  Airway Mallampati: II  TM Distance: >3 FB Neck ROM: Full   Comment: Neck pain  Dental  (+) Dental Advisory Given, Missing   Pulmonary Current Smoker and Patient abstained from smoking.,    Pulmonary exam normal breath sounds clear to auscultation       Cardiovascular Exercise Tolerance: Good hypertension, Pt. on medications + Peripheral Vascular Disease  Normal cardiovascular exam Rhythm:Regular Rate:Normal     Neuro/Psych negative neurological ROS  negative psych ROS   GI/Hepatic negative GI ROS, (+) Cirrhosis  (obstructive jaundice)    substance abuse  alcohol use,   Endo/Other  diabetes, Well Controlled, Type 2, Oral Hypoglycemic Agents  Renal/GU negative Renal ROS     Musculoskeletal  (+) Arthritis  (chronic pain),   Abdominal   Peds  Hematology negative hematology ROS (+)   Anesthesia Other Findings Hyponatremia   Reproductive/Obstetrics negative OB ROS                           Anesthesia Physical Anesthesia Plan  ASA: 4  Anesthesia Plan: General   Post-op Pain Management:    Induction: Intravenous  PONV Risk Score and Plan: 3 and Ondansetron  Airway Management Planned: Oral ETT  Additional Equipment:   Intra-op Plan:   Post-operative Plan: Extubation in OR  Informed Consent: I have reviewed the patients History and Physical, chart, labs and discussed the procedure including the risks, benefits and alternatives for the proposed anesthesia with the patient or authorized representative who has indicated his/her understanding and acceptance.     Dental advisory given  Plan Discussed with:  CRNA and Surgeon  Anesthesia Plan Comments:         Anesthesia Quick Evaluation

## 2021-02-25 NOTE — Anesthesia Procedure Notes (Signed)
Procedure Name: Intubation Date/Time: 02/25/2021 1:21 PM Performed by: Orlie Dakin, CRNA Pre-anesthesia Checklist: Patient identified, Emergency Drugs available, Suction available and Patient being monitored Patient Re-evaluated:Patient Re-evaluated prior to induction Oxygen Delivery Method: Circle system utilized Preoxygenation: Pre-oxygenation with 100% oxygen Induction Type: IV induction Ventilation: Mask ventilation without difficulty and Oral airway inserted - appropriate to patient size Laryngoscope Size: Sabra Heck and 3 Grade View: Grade II Tube type: Oral Tube size: 7.5 mm Number of attempts: 1 Airway Equipment and Method: Stylet Placement Confirmation: ETT inserted through vocal cords under direct vision, positive ETCO2 and breath sounds checked- equal and bilateral Secured at: 24 cm Tube secured with: Tape Dental Injury: Teeth and Oropharynx as per pre-operative assessment  Comments: Large front upper 2 teeth and many missing, none loose per patient report.

## 2021-02-25 NOTE — Progress Notes (Signed)
Brief ERCP note  Prepyloric gastritis. Normal ampulla of Vater. Normal pancreatogram. Normal distal CBD. 2 strictures noted in proximal bile duct with upstream dilation. No filling defects noted. Biliary sphincterotomy performed. Brushing cytology obtained. Unable to place 10 French 9 cm long plastic stent.  Therefore 8.5 French 9 cm long biliary stent placed for decompression. Proximal margin just above the proximal stricture. Patient tolerated the procedure well.

## 2021-02-25 NOTE — Progress Notes (Signed)
PROGRESS NOTE    Jonathan Keith  CXK:[id number] DOB: 1956/05/23 DOA: 02/24/2021 PCP: [name], PA-C     Brief Narrative:  Jonathan Keith is a 65 y.o. male with medical history significant of claudication presenting with jaundice.  He states that for about a week, patient has continued to feel fatigue and weakness.  Most concerned about the color of his urine, then his coworker started to comment that his skin was turning yellow.  He went to go see his primary care physician, and was instructed to seek care in the emergency department.  Work-up with CT revealed biliary obstruction with abnormal appearing gallbladder.  GI as well as general surgery were consulted.  New events last 24 hours / Subjective: Had a short episode of periumbilical abdominal pain which has now resolved.  Eager to get ERCP completed and to eat afterward.  Assessment & Plan:   Principal Problem:   Obstructive jaundice Active Problems:   Diabetes mellitus (HCC)   Dyslipidemia   Hypertension   Tobacco dependence   Elevated LFTs   Choledocholithiasis   Obstructive jaundice with elevated liver function test and hyperbilirubinemia -CT A/P: Intrahepatic and extrahepatic biliary duct dilation, with multiple small high attenuation obstructing foci within the common hepatic and common bile duct. Moderate gallbladder distension, with focal circumferential wall thickening and enhancement within the body of the gallbladder. Differential diagnosis includes focal cholecystitis versus neoplasm. -GI planning for ERCP today -Empiric IV Zosyn  Diabetes mellitus with hyperglycemia -Hemoglobin A1c 8.7 -Holding metformin -SSI   Hyperlipidemia -Holding Pravachol in setting of elevated liver function  DVT prophylaxis:  SCDs Start: 02/24/21 1907  Code Status:     Code Status Orders  (From admission, onward)           Start     Ordered   02/24/21 1907  Full code  Continuous        02/24/21 1907            Code Status History     This patient has a current code status but no historical code status.      Advance Directive Documentation    Flowsheet Row Most Recent Value  Type of Advance Directive Healthcare Power of Attorney  Pre-existing out of facility DNR order (yellow form or pink MOST form) --  "MOST" Form in Place? --      Family Communication: Spouse at bedside Disposition Plan:  Status is: Inpatient  Remains inpatient appropriate because:Inpatient level of care appropriate due to severity of illness  Dispo: The patient is from: Home              Anticipated d/c is to: Home              Patient currently is not medically stable to d/c.   Difficult to place patient No      Consultants:  GI  Procedures:  ERCP 7/19  Antimicrobials:  Anti-infectives (From admission, onward)    Start     Dose/Rate Route Frequency Ordered Stop   02/24/21 2200  piperacillin-tazobactam (ZOSYN) IVPB 3.375 g  Status:  Discontinued        3.375 g 12.5 mL/hr over 240 Minutes Intravenous Every 8 hours 02/24/21 2003 02/24/21 2004   02/24/21 2100  [MAR Hold]  piperacillin-tazobactam (ZOSYN) IVPB 3.375 g        (MAR Hold since Tue 02/25/2021 at 1130.Hold Reason: Transfer to a Procedural area)   3.375 g 12.5 mL/hr over 240 Minutes Intravenous Every  8 hours 02/24/21 2004     02/24/21 1745  cefTRIAXone (ROCEPHIN) 1 g in sodium chloride 0.9 % 100 mL IVPB        1 g 200 mL/hr over 30 Minutes Intravenous  Once 02/24/21 1736 02/24/21 1815        Objective: Vitals:   02/24/21 2259 02/25/21 0302 02/25/21 0636 02/25/21 1143  BP: 118/78 128/76 133/82 (!) 163/98  Pulse: 78 72 72 82  Resp: 18 16 18 14   Temp: 97.9 F (36.6 C) 98 F (36.7 C) 97.6 F (36.4 C) (!) 97.5 F (36.4 C)  TempSrc: Oral   Oral  SpO2: 100% 96% 98% 95%  Weight:      Height:        Intake/Output Summary (Last 24 hours) at 02/25/2021 1213 Last data filed at 02/25/2021 0500 Gross per 24 hour  Intake 586.36 ml   Output --  Net 586.36 ml   Filed Weights   02/24/21 1211 02/24/21 1905  Weight: 104.8 kg 85.9 kg    Examination:  General exam: Appears calm and comfortable, +jaundiced  Respiratory system: Clear to auscultation. Respiratory effort normal. No respiratory distress. No conversational dyspnea.  Cardiovascular system: S1 & S2 heard, RRR. No murmurs. No pedal edema. Gastrointestinal system: Abdomen is nondistended, soft and nontender. Normal bowel sounds heard. +periumbilical hernia  Central nervous system: Alert and oriented. No focal neurological deficits. Speech clear.  Extremities: Symmetric in appearance  Skin: No rashes, lesions or ulcers on exposed skin  Psychiatry: Judgement and insight appear normal. Mood & affect appropriate.   Data Reviewed: I have personally reviewed following labs and imaging studies  CBC: Recent Labs  Lab 02/24/21 1223 02/24/21 1233 02/25/21 0346  WBC 7.7  --  6.4  NEUTROABS 4.3  --   --   HGB 13.3 15.0 12.5*  HCT 39.5 44.0 36.5*  MCV 93.8  --  95.3  PLT 183  --  194   Basic Metabolic Panel: Recent Labs  Lab 02/24/21 1223 02/24/21 1233 02/25/21 0346  NA 130* 135 129*  K 4.1 4.2 3.9  CL 100 105 99  CO2 22  --  23  GLUCOSE 194* 196* 179*  BUN 20 19 17   CREATININE 0.57* 0.90 0.54*  CALCIUM 9.4  --  9.0   GFR: Estimated Creatinine Clearance: 98 mL/min (A) (by C-G formula based on SCr of 0.54 mg/dL (L)). Liver Function Tests: Recent Labs  Lab 02/24/21 1223 02/25/21 0346  AST 111* 115*  ALT 230* 208*  ALKPHOS 355* 318*  BILITOT 18.0* 17.6*  PROT 7.1 6.3*  ALBUMIN 3.2* 2.8*   Recent Labs  Lab 02/24/21 1223  LIPASE 34   No results for input(s): AMMONIA in the last 168 hours. Coagulation Profile: No results for input(s): INR, PROTIME in the last 168 hours. Cardiac Enzymes: No results for input(s): CKTOTAL, CKMB, CKMBINDEX, TROPONINI in the last 168 hours. BNP (last 3 results) No results for input(s): PROBNP in the last 8760  hours. HbA1C: Recent Labs    02/24/21 1223  HGBA1C 8.7*   CBG: Recent Labs  Lab 02/24/21 2047 02/25/21 1157  GLUCAP 258* 140*   Lipid Profile: No results for input(s): CHOL, HDL, LDLCALC, TRIG, CHOLHDL, LDLDIRECT in the last 72 hours. Thyroid Function Tests: No results for input(s): TSH, T4TOTAL, FREET4, T3FREE, THYROIDAB in the last 72 hours. Anemia Panel: No results for input(s): VITAMINB12, FOLATE, FERRITIN, TIBC, IRON, RETICCTPCT in the last 72 hours. Sepsis Labs: No results for input(s): PROCALCITON, LATICACIDVEN in the  last 168 hours.  Recent Results (from the past 240 hour(s))  Resp Panel by RT-PCR (Flu A&B, Covid) Nasopharyngeal Swab     Status: None   Collection Time: 02/24/21  5:36 PM   Specimen: Nasopharyngeal Swab; Nasopharyngeal(NP) swabs in vial transport medium  Result Value Ref Range Status   SARS Coronavirus 2 by RT PCR NEGATIVE NEGATIVE Final    Comment: (NOTE) SARS-CoV-2 target nucleic acids are NOT DETECTED.  The SARS-CoV-2 RNA is generally detectable in upper respiratory specimens during the acute phase of infection. The lowest concentration of SARS-CoV-2 viral copies this assay can detect is 138 copies/mL. A negative result does not preclude SARS-Cov-2 infection and should not be used as the sole basis for treatment or other patient management decisions. A negative result may occur with  improper specimen collection/handling, submission of specimen other than nasopharyngeal swab, presence of viral mutation(s) within the areas targeted by this assay, and inadequate number of viral copies(<138 copies/mL). A negative result must be combined with clinical observations, patient history, and epidemiological information. The expected result is Negative.  Fact Sheet for Patients:  EntrepreneurPulse.com.au  Fact Sheet for Healthcare Providers:  IncredibleEmployment.be  This test is no t yet approved or cleared by the  [place] FDA and  has been authorized for detection and/or diagnosis of SARS-CoV-2 by FDA under an Emergency Use Authorization (EUA). This EUA will remain  in effect (meaning this test can be used) for the duration of the COVID-19 declaration under Section 564(b)(1) of the Act, 21 U.S.C.section 360bbb-3(b)(1), unless the authorization is terminated  or revoked sooner.       Influenza A by PCR NEGATIVE NEGATIVE Final   Influenza B by PCR NEGATIVE NEGATIVE Final    Comment: (NOTE) The Xpert Xpress SARS-CoV-2/FLU/RSV plus assay is intended as an aid in the diagnosis of influenza from Nasopharyngeal swab specimens and should not be used as a sole basis for treatment. Nasal washings and aspirates are unacceptable for Xpert Xpress SARS-CoV-2/FLU/RSV testing.  Fact Sheet for Patients: EntrepreneurPulse.com.au  Fact Sheet for Healthcare Providers: IncredibleEmployment.be  This test is not yet approved or cleared by the [place] FDA and has been authorized for detection and/or diagnosis of SARS-CoV-2 by FDA under an Emergency Use Authorization (EUA). This EUA will remain in effect (meaning this test can be used) for the duration of the COVID-19 declaration under Section 564(b)(1) of the Act, 21 U.S.C. section 360bbb-3(b)(1), unless the authorization is terminated or revoked.  Performed at [facility], [address], [place], [place] [address]       Radiology Studies: CT ABDOMEN PELVIS W CONTRAST  Result Date: 02/24/2021 CLINICAL DATA:  Abdominal distension, weakness, jaundice EXAM: CT ABDOMEN AND PELVIS WITH CONTRAST TECHNIQUE: Multidetector CT imaging of the abdomen and pelvis was performed using the standard protocol following bolus administration of intravenous contrast. CONTRAST:  131mL OMNIPAQUE IOHEXOL 300 MG/ML  SOLN COMPARISON:  None. FINDINGS: Lower chest: No acute pleural or parenchymal lung disease. Minimal bilateral lower lobe  bronchiectasis. Hepatobiliary: There is marked intrahepatic and extrahepatic biliary duct dilation, with common bile duct measuring up to 10 mm in size. There are multiple rounded areas of high attenuation within the common hepatic duct and common bile duct, reference images 22, 23, 26. Largest area measures 6 mm. The gallbladder is moderately distended. Circumferential wall thickening and enhancement is seen within the mid aspect of the gallbladder. This irregular area of gallbladder wall thickening measures up to 7 mm. Differential includes localized inflammatory change versus neoplasm.  Pancreas: The pancreas enhances normally. Mild fatty atrophy of the pancreatic body and tail. No surrounding inflammatory changes. Spleen: Normal in size without focal abnormality. Adrenals/Urinary Tract: Adrenal glands are unremarkable. Kidneys are normal, without renal calculi, focal lesion, or hydronephrosis. Bladder is unremarkable. Stomach/Bowel: No bowel obstruction or ileus. Normal appendix right lower quadrant. Scattered diverticulosis throughout the colon. There is nonspecific wall thickening of the mid sigmoid colon without surrounding inflammation. This may reflect wall thickening due to under distension versus scarring from previous bouts of diverticulitis. Vascular/Lymphatic: Aortic atherosclerosis. No enlarged abdominal or pelvic lymph nodes. Reproductive: Prostate is unremarkable. Other: No free fluid or free gas. Small fat containing umbilical hernia. No bowel herniation. Musculoskeletal: No acute or destructive bony lesions. Reconstructed images demonstrate no additional findings. IMPRESSION: 1. Intrahepatic and extrahepatic biliary duct dilation, with multiple small high attenuation obstructing foci within the common hepatic and common bile duct as above. The appearance would favor multiple obstructing common bile duct calculi. ERCP may be useful for further evaluation and decompression of the biliary system. 2.  Moderate gallbladder distension, with focal circumferential wall thickening and enhancement within the body of the gallbladder. Differential diagnosis includes focal cholecystitis versus neoplasm. If further imaging is desired, dedicated liver MRI with and without contrast could be considered. 3. Diverticulosis of the sigmoid colon without evidence of acute diverticulitis. Nonspecific sigmoid colon wall thickening most consistent with scarring from previous bouts of diverticulitis. 4.  Aortic Atherosclerosis (ICD10-I70.0). Electronically Signed   By: Randa Ngo M.D.   On: 02/24/2021 15:17      Scheduled Meds:  chlorhexidine  15 mL Mouth/Throat Once   Or   mouth rinse  15 mL Mouth Rinse Once   glucagon (human recombinant)       [MAR Hold] insulin aspart  0-15 Units Subcutaneous TID WC   [MAR Hold] insulin aspart  0-5 Units Subcutaneous QHS   Continuous Infusions:  sodium chloride     lactated ringers 75 mL/hr at 02/24/21 1953   lactated ringers 50 mL/hr at 02/25/21 1156   [MAR Hold] piperacillin-tazobactam (ZOSYN)  IV 3.375 g (02/25/21 0458)   sodium chloride       LOS: 1 day      Time spent: 30 minutes   Dessa Phi, DO [facility] 02/25/2021, 12:13 PM   Available via Epic secure chat 7am-7pm After these hours, please refer to coverage provider listed on [email or web address]

## 2021-02-25 NOTE — Anesthesia Postprocedure Evaluation (Signed)
Anesthesia Post Note  Patient: Jonathan Keith  Procedure(s) Performed: ENDOSCOPIC RETROGRADE CHOLANGIOPANCREATOGRAPHY (ERCP) SPHINCTEROTOMY BILIARY STENT PLACEMENT BILIARY BRUSHING  Patient location during evaluation: PACU Anesthesia Type: General Level of consciousness: awake and alert and oriented Pain management: pain level controlled Vital Signs Assessment: post-procedure vital signs reviewed and stable Respiratory status: spontaneous breathing and respiratory function stable Cardiovascular status: blood pressure returned to baseline and stable Postop Assessment: no apparent nausea or vomiting Anesthetic complications: no   No notable events documented.   Last Vitals:  Vitals:   02/25/21 1434 02/25/21 1445  BP:  113/79  Pulse:  83  Resp:  14  Temp: 36.5 C   SpO2:  98%    Last Pain:  Vitals:   02/25/21 1445  TempSrc:   PainSc: 0-No pain                 Donatello Kleve C Jelena Malicoat

## 2021-02-25 NOTE — Progress Notes (Signed)
Reviewed brief ERCP note with report of 2 strictures in the proximal bile duct with upstream dilation, no filling defects s/p placement of plastic stent for decompression.  Reviewed Dr. Constance Haw note today.  Recommended MRCP for further evaluation due to concerns for underlying neoplasm. They are holding on consult.   I will place orders for MRI/MRCP.   Aliene Altes, PA-C [facility]

## 2021-02-25 NOTE — Transfer of Care (Signed)
Immediate Anesthesia Transfer of Care Note  Patient: Jonathan Keith  Procedure(s) Performed: ENDOSCOPIC RETROGRADE CHOLANGIOPANCREATOGRAPHY (ERCP) SPHINCTEROTOMY BILIARY STENT PLACEMENT BILIARY BRUSHING  Patient Location: PACU  Anesthesia Type:General  Level of Consciousness: awake, alert , oriented and patient cooperative  Airway & Oxygen Therapy: Patient Spontanous Breathing  Post-op Assessment: Report given to RN, Post -op Vital signs reviewed and stable and Patient moving all extremities X 4  Post vital signs: Reviewed and stable  Last Vitals:  Vitals Value Taken Time  BP    Temp    Pulse 91 02/25/21 1435  Resp 23 02/25/21 1435  SpO2 96 % 02/25/21 1435  Vitals shown include unvalidated device data.  Last Pain:  Vitals:   02/25/21 1143  TempSrc: Oral  PainSc:       Patients Stated Pain Goal: 5 (16/10/96 0454)  Complications: No notable events documented.

## 2021-02-25 NOTE — Progress Notes (Signed)
Subjective:  Patient states his symptoms started about a week 2 weeks ago when he noted yellowish discoloration to skin of his legs.  One of his colleagues at work told him his eyes were yellow.  He has felt tired.  He has not experienced fever or chills abdominal or chest pain.  He is not sure if he has lost any weight.  He did have self-limiting bout of diarrhea.  He has had dark yellow urine but stools have not been clay colored.  He states he smokes about a pack of cigarettes daily.  He has not had any alcohol in 2-1/2 years.  Current Medications:  Current Facility-Administered Medications:    0.9 %  sodium chloride infusion, , Intravenous, Continuous, Jodi Mourning Kristen S, PA-C   [MAR Hold] acetaminophen (TYLENOL) tablet 650 mg, 650 mg, Oral, Q6H PRN, 650 mg at 02/24/21 2135 **OR** [MAR Hold] acetaminophen (TYLENOL) suppository 650 mg, 650 mg, Rectal, Q6H PRN, Karmen Bongo, MD   chlorhexidine (PERIDEX) 0.12 % solution 15 mL, 15 mL, Mouth/Throat, Once **OR** MEDLINE mouth rinse, 15 mL, Mouth Rinse, Once, Battula, Rajamani C, MD   glucagon (human recombinant) (GLUCAGEN) 1 MG injection, , , ,    [MAR Hold] hydrALAZINE (APRESOLINE) injection 10 mg, 10 mg, Intravenous, Q2H PRN, Karmen Bongo, MD   [facility] Hold] insulin aspart (novoLOG) injection 0-15 Units, 0-15 Units, Subcutaneous, TID WC, Karmen Bongo, MD, 3 Units at 02/25/21 0853   [MAR Hold] insulin aspart (novoLOG) injection 0-5 Units, 0-5 Units, Subcutaneous, QHS, Karmen Bongo, MD, 3 Units at 02/24/21 2135   lactated ringers infusion, , Intravenous, Continuous, Karmen Bongo, MD, Last Rate: 75 mL/hr at 02/24/21 1953, New Bag at 02/24/21 1953   lactated ringers infusion, , Intravenous, Continuous, Battula, Rajamani C, MD, Last Rate: 50 mL/hr at 02/25/21 1156, Continued from Pre-op at 02/25/21 1156   [MAR Hold] morphine 2 MG/ML injection 2 mg, 2 mg, Intravenous, Q2H PRN, Karmen Bongo, MD   [facility] Hold] ondansetron (ZOFRAN-ODT)  disintegrating tablet 4 mg, 4 mg, Oral, Q6H PRN **OR** [MAR Hold] ondansetron (ZOFRAN) injection 4 mg, 4 mg, Intravenous, Q6H PRN, Karmen Bongo, MD   [facility] Hold] oxyCODONE (Oxy IR/ROXICODONE) immediate release tablet 5-10 mg, 5-10 mg, Oral, Q4H PRN, Karmen Bongo, MD   [facility] Hold] piperacillin-tazobactam (ZOSYN) IVPB 3.375 g, 3.375 g, Intravenous, Q8H, Karmen Bongo, MD, Last Rate: 12.5 mL/hr at 02/25/21 0458, 3.375 g at 02/25/21 0458   sodium chloride 0.9 % infusion, , , ,    Objective: Blood pressure (!) 163/98, pulse 82, temperature (!) 97.5 F (36.4 C), temperature source Oral, resp. rate 14, height 5' 11"  (1.803 m), weight 85.9 kg, SpO2 95 %. Patient is alert and in no acute distress. He is jaundiced Conjunctiva is pink. Sclera is icteric Oropharyngeal mucosa is normal. No neck masses or thyromegaly noted. Cardiac exam with regular rhythm normal S1 and S2. No murmur or gallop noted. Lungs are clear to auscultation. Abdomen he has small umbilical hernia which is soft but not completely reducible.  Abdomen is soft and nontender with organomegaly or masses. No LE edema or clubbing noted.  Labs/studies Results:   CBC Latest Ref Rng & Units 02/25/2021 02/24/2021 02/24/2021  WBC 4.0 - 10.5 K/uL 6.4 - 7.7  Hemoglobin 13.0 - 17.0 g/dL 12.5(L) 15.0 13.3  Hematocrit 39.0 - 52.0 % 36.5(L) 44.0 39.5  Platelets 150 - 400 K/uL 194 - 183    CMP Latest Ref Rng & Units 02/25/2021 02/24/2021 02/24/2021  Glucose 70 - 99 mg/dL 179(H) 196(H)  194(H)  BUN 8 - 23 mg/dL 17 19 20   Creatinine 0.61 - 1.24 mg/dL 0.54(L) 0.90 0.57(L)  Sodium 135 - 145 mmol/L 129(L) 135 130(L)  Potassium 3.5 - 5.1 mmol/L 3.9 4.2 4.1  Chloride 98 - 111 mmol/L 99 105 100  CO2 22 - 32 mmol/L 23 - 22  Calcium 8.9 - 10.3 mg/dL 9.0 - 9.4  Total Protein 6.5 - 8.1 g/dL 6.3(L) - 7.1  Total Bilirubin 0.3 - 1.2 mg/dL 17.6(H) - 18.0(H)  Alkaline Phos 38 - 126 U/L 318(H) - 355(H)  AST 15 - 41 U/L 115(H) - 111(H)  ALT 0 - 44 U/L  208(H) - 230(H)    Hepatic Function Latest Ref Rng & Units 02/25/2021 02/24/2021  Total Protein 6.5 - 8.1 g/dL 6.3(L) 7.1  Albumin 3.5 - 5.0 g/dL 2.8(L) 3.2(L)  AST 15 - 41 U/L 115(H) 111(H)  ALT 0 - 44 U/L 208(H) 230(H)  Alk Phosphatase 38 - 126 U/L 318(H) 355(H)  Total Bilirubin 0.3 - 1.2 mg/dL 17.6(H) 18.0(H)    Abdominal pelvic CT images reviewed with Dr. Thornton Papas. Bile duct is not very prominent measuring 8 mm with filling defect.  Dilated intrahepatic biliary radicles.  Distended gallbladder with with focal circumferential wall thickening within the body.  Pancreas is unremarkable. Sigmoid colon diverticulosis and focal wall thickening to sigmoid colon  Assessment:  #1.  Patient is 65 year old Caucasian male who presents with painless jaundice.  CT suggests choledocholithiasis.  I am concerned that it could be additional process such as Mirizzi syndrome or even a neoplasm.  Gallbladder findings are also concerning. Patient is agreeable to proceed with ERCP with sphincterotomy and stone extraction.  I would also consider placing biliary stent even if process is benign given severe cholestasis. Patient is on IV Zosyn. Procedure and risks reviewed with patient and he is agreeable.  #2. focal wall thickening to sigmoid colon to be evaluated at a later date.

## 2021-02-25 NOTE — Consult Note (Addendum)
@LOGO @   Referring Provider: Daleen Bo, MD Primary Care Physician:  [name], PA-C Primary Gastroenterologist:  Dr. Jenetta Downer (previously unassigned)  Date of Admission: 02/24/21 Date of Consultation: 02/25/21  Reason for Consultation: Elevated LFTs, elevated bilirubin, biliary obstruction/choledocholithiasis   HPI:  Jonathan Keith is a 65 y.o. year old male with history of diabetes, HTN who presented to the emergency room on 7/18 due to new onset jaundice with associated weakness, anorexia,.  ED course:  Hemodynamically stable with hypertension on presentation, BP 164/92.  Otherwise, vitals within normal limits. Labs with total bilirubin 18, alk phos 355, AST 111, ALT 230.  Sodium 130, potassium 4.1, creatinine 0.57.  WBC 7.7, hemoglobin 13.3.  Lipase 34.  Respiratory panel negative for COVID-19 and influenza A/B. CT A/P with contrast with marked intrahepatic and exxtrahepatic biliary duct dilation, CBD measuring up to 10 mm.  Multiple rounded areas of high attenuation within the CBD and common hepatic duct favoring multiple obstructing common bile duct calculi.  Moderate gallbladder distention with focal circumferential wall thickening and enhancement within the body of the gallbladder with differential including focal cholecystitis versus neoplasm.  Also with sigmoid diverticulosis, nonspecific sigmoid wall thickening most consistent with scarring from previous bouts of diverticulitis.  He was given a single dose of ceftriaxone and started on Zosyn.    Today:  Over the last couple of weeks, he noticed progressive fatigue/lack of energy. Noticed pee was turning dark and eyes were turning yellow. Noticed his abdomen wasn't feeling right. Saturday, he had increased bowel frequency and some abdominal pain. Most of the stools were formed. 1 episode of watery stool. Pain was mid to lower abdomen. Sharp. Had a lot of gas. Yesterday, he saw PCP who came to see him in the car and told him  to come top the ED due to jaundice. No nausea or vomiting. Over the weekend, he had lack of appetite. No worsening of abdominal pain with eating. No similar symptoms. Denies confusion.   Little heartburn last week with tomatoes. Otherwise, no history of GERD, dysphagia, brbpr, melena.   No prior EGD or colonoscopy. No known history of diverticulitis.  No known history of liver disease.  No prior issues with his gallbladder.  Tylenol and or Advil 1-2 times a day for chronic knee pain.   Drank 2 beer last week. This is the first time he has drank in years.  No illicit drug use.   No family history of cancer.   Past Medical History:  Diagnosis Date   Chronic pain    neck, back, knees   Class 1 obesity due to excess calories with body mass index (BMI) of 31.0 to 31.9 in adult    Claudication [facility])    DDD (degenerative disc disease), cervical    Diabetes mellitus ([place])    Dyslipidemia    Hypertension    Tobacco dependence     Past Surgical History:  Procedure Laterality Date   BREAST SURGERY Left    benign lump- in his 40s.   KNEE SURGERY Left    x2    Prior to Admission medications   Medication Sig Start Date End Date Taking? Authorizing Provider  Calcium Carb-Cholecalciferol (SUPER CALCIUM 600 + D 400 PO) Take by mouth.   Yes [provider]  dextromethorphan (TUSSIN COUGH) 15 MG/5ML syrup Take 10 mLs by mouth daily.   Yes [provider]  metFORMIN (GLUCOPHAGE-XR) 500 MG 24 hr tablet Take 500 mg by mouth daily. 06/16/20  Yes  [provider]  Omega-3 Fatty Acids (FISH OIL) 1000 MG CPDR Take 2 g by mouth in the morning and at bedtime.   Yes [provider]  pravastatin (PRAVACHOL) 20 MG tablet Take 20 mg by mouth daily. 06/28/20  Yes [provider]    Current Facility-Administered Medications  Medication Dose Route Frequency Provider Last Rate Last Admin   acetaminophen (TYLENOL) tablet 650 mg  650 mg Oral Q6H PRN Karmen Bongo,  MD   650 mg at 02/24/21 2135   Or   acetaminophen (TYLENOL) suppository 650 mg  650 mg Rectal Q6H PRN Karmen Bongo, MD       hydrALAZINE (APRESOLINE) injection 10 mg  10 mg Intravenous Q2H PRN Karmen Bongo, MD       insulin aspart (novoLOG) injection 0-15 Units  0-15 Units Subcutaneous TID WC Karmen Bongo, MD       insulin aspart (novoLOG) injection 0-5 Units  0-5 Units Subcutaneous QHS Karmen Bongo, MD   3 Units at 02/24/21 2135   lactated ringers infusion   Intravenous Continuous Karmen Bongo, MD 75 mL/hr at 02/24/21 1953 New Bag at 02/24/21 1953   morphine 2 MG/ML injection 2 mg  2 mg Intravenous Q2H PRN Karmen Bongo, MD       ondansetron (ZOFRAN-ODT) disintegrating tablet 4 mg  4 mg Oral Q6H PRN Karmen Bongo, MD       Or   ondansetron [facility]) injection 4 mg  4 mg Intravenous Q6H PRN Karmen Bongo, MD       oxyCODONE (Oxy IR/ROXICODONE) immediate release tablet 5-10 mg  5-10 mg Oral Q4H PRN Karmen Bongo, MD       piperacillin-tazobactam (ZOSYN) IVPB 3.375 g  3.375 g Intravenous Lynne Logan, MD 12.5 mL/hr at 02/25/21 0458 3.375 g at 02/25/21 0458    Allergies as of 02/24/2021   (No Known Allergies)    Family History  Problem Relation Age of Onset   CAD Mother    Cancer Neg Hx     Social History   Socioeconomic History   Marital status: Married    Spouse name: Not on file   Number of children: Not on file   Years of education: Not on file   Highest education level: Not on file  Occupational History   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  Tobacco Use   Smoking status: Every Day    Packs/day: 2.00    Years: 56.00    Pack years: 112.00    Types: Cigarettes   Smokeless tobacco: Never  Vaping Use   Vaping Use: Never used  Substance and Sexual Activity   Alcohol use: Yes    Comment: rare - 2 this last week but that was the first time in maybe 18 months   Drug use: Never   Sexual activity: Not on file  Other Topics Concern   Not on file   Social History Narrative   Not on file   Social Determinants of Health   Financial Resource Strain: Not on file  Food Insecurity: Not on file  Transportation Needs: Not on file  Physical Activity: Not on file  Stress: Not on file  Social Connections: Not on file  Intimate Partner Violence: Not on file    Review of Systems: Gen: Denies fever, chills.  Admits to recent sinus congestion.  Denies unintentional weight loss, presyncope, syncope. CV: Denies chest pain, heart palpitations. Resp: Denies shortness of breath.  Intermittent cough. GI: See HPI GU : Denies urinary burning, urinary frequency,  urinary incontinence.  Admits to dark urine. MS: Chronic left knee pain. Derm: Denies rash Psych: Denies depression, anxiety Heme: See HPI  Physical Exam: Vital signs in last 24 hours: Temp:  [97.6 F (36.4 C)-98.3 F (36.8 C)] 97.6 F (36.4 C) (07/19 0636) Pulse Rate:  [72-91] 72 (07/19 0636) Resp:  [16-19] 18 (07/19 0636) BP: (118-164)/(75-93) 133/82 (07/19 0636) SpO2:  [94 %-100 %] 98 % (07/19 0636) Weight:  [85.9 kg-104.8 kg] 85.9 kg (07/18 1905) Last BM Date: 02/24/21 General:   Alert,  Well-developed, well-nourished, pleasant and cooperative in NAD, jaundiced  Head:  Normocephalic and atraumatic. Eyes:  + Scleral icterus.  Ears:  Normal auditory acuity. Lungs:  Clear throughout to auscultation.   No wheezes, crackles, or rhonchi. No acute distress. Heart:  Regular rate and rhythm; no murmurs, clicks, rubs,  or gallops. Abdomen:   Normal bowel sounds. Abdomen is full but non-tense. Minimal soreness in RUQ and LUQ without guarding, and without rebound. Umbilical hernia present.   Rectal:  Deferred  Msk:  Symmetrical without gross deformities. Normal posture. Extremities:  Without edema. Neurologic:  Alert and  oriented x4;  grossly normal neurologically. Skin:  Intact without significant lesions or rashes. Jaundiced.  Psych:  Normal mood and affect.  Intake/Output from  previous day: 07/18 0701 - 07/19 0700 In: 586.4 [P.O.:240; I.V.:286.1; IV Piggyback:60.3] Out: -  Intake/Output this shift: No intake/output data recorded.  Lab Results: Recent Labs    02/24/21 1223 02/24/21 1233 02/25/21 0346  WBC 7.7  --  6.4  HGB 13.3 15.0 12.5*  HCT 39.5 44.0 36.5*  PLT 183  --  194   BMET Recent Labs    02/24/21 1223 02/24/21 1233 02/25/21 0346  NA 130* 135 129*  K 4.1 4.2 3.9  CL 100 105 99  CO2 22  --  23  GLUCOSE 194* 196* 179*  BUN 20 19 17   CREATININE 0.57* 0.90 0.54*  CALCIUM 9.4  --  9.0   LFT Recent Labs    02/24/21 1223 02/25/21 0346  PROT 7.1 6.3*  ALBUMIN 3.2* 2.8*  AST 111* 115*  ALT 230* 208*  ALKPHOS 355* 318*  BILITOT 18.0* 17.6*   Studies/Results: CT ABDOMEN PELVIS W CONTRAST  Result Date: 02/24/2021 CLINICAL DATA:  Abdominal distension, weakness, jaundice EXAM: CT ABDOMEN AND PELVIS WITH CONTRAST TECHNIQUE: Multidetector CT imaging of the abdomen and pelvis was performed using the standard protocol following bolus administration of intravenous contrast. CONTRAST:  174m OMNIPAQUE IOHEXOL 300 MG/ML  SOLN COMPARISON:  None. FINDINGS: Lower chest: No acute pleural or parenchymal lung disease. Minimal bilateral lower lobe bronchiectasis. Hepatobiliary: There is marked intrahepatic and extrahepatic biliary duct dilation, with common bile duct measuring up to 10 mm in size. There are multiple rounded areas of high attenuation within the common hepatic duct and common bile duct, reference images 22, 23, 26. Largest area measures 6 mm. The gallbladder is moderately distended. Circumferential wall thickening and enhancement is seen within the mid aspect of the gallbladder. This irregular area of gallbladder wall thickening measures up to 7 mm. Differential includes localized inflammatory change versus neoplasm. Pancreas: The pancreas enhances normally. Mild fatty atrophy of the pancreatic body and tail. No surrounding inflammatory changes.  Spleen: Normal in size without focal abnormality. Adrenals/Urinary Tract: Adrenal glands are unremarkable. Kidneys are normal, without renal calculi, focal lesion, or hydronephrosis. Bladder is unremarkable. Stomach/Bowel: No bowel obstruction or ileus. Normal appendix right lower quadrant. Scattered diverticulosis throughout the colon. There is nonspecific wall thickening  of the mid sigmoid colon without surrounding inflammation. This may reflect wall thickening due to under distension versus scarring from previous bouts of diverticulitis. Vascular/Lymphatic: Aortic atherosclerosis. No enlarged abdominal or pelvic lymph nodes. Reproductive: Prostate is unremarkable. Other: No free fluid or free gas. Small fat containing umbilical hernia. No bowel herniation. Musculoskeletal: No acute or destructive bony lesions. Reconstructed images demonstrate no additional findings. IMPRESSION: 1. Intrahepatic and extrahepatic biliary duct dilation, with multiple small high attenuation obstructing foci within the common hepatic and common bile duct as above. The appearance would favor multiple obstructing common bile duct calculi. ERCP may be useful for further evaluation and decompression of the biliary system. 2. Moderate gallbladder distension, with focal circumferential wall thickening and enhancement within the body of the gallbladder. Differential diagnosis includes focal cholecystitis versus neoplasm. If further imaging is desired, dedicated liver MRI with and without contrast could be considered. 3. Diverticulosis of the sigmoid colon without evidence of acute diverticulitis. Nonspecific sigmoid colon wall thickening most consistent with scarring from previous bouts of diverticulitis. 4.  Aortic Atherosclerosis (ICD10-I70.0). Electronically Signed   By: Randa Ngo M.D.   On: 02/24/2021 15:17    Impression: 65 year old male with history of diabetes, HTN, HLD who presented to the emergency room due to new onset  jaundice with associated weakness and anorexia.   Patient reports he first started noticing jaundice/weakness/fatigue about 2 weeks ago which has been progressive.  Over the weekend, he developed mid to lower abdominal pain and increase stool frequency without significant diarrhea, nausea, or vomiting.  Pain was short-lived and resolved with bowel movements and passing gas.  Since that time, he has had lack of appetite though denies postprandial abdominal pain.  Denies any significant GI history. No unintentional weight loss. No prior EGD or colonoscopy.  Denies any significant alcohol use.  No history of drug use.  No known family history of cancer.  In the ED, he was found to have total bilirubin 18, alk phos 355, AST 111, ALT 230, lipase 34, WBC 7.7.  CT A/P with contrast with marked intra and extrahepatic biliary dilation, CBD measuring up to 10 mm with multiple rounded areas of high attenuation within the CBD and common hepatic duct favoring choledocholithiasis.  Also with moderate gallbladder distention with focal circumferential wall thickening and enhancement within the body of the gallbladder concerning for focal cholecystitis versus neoplasm.  Also with sigmoid diverticulosis, nonspecific sigmoid wall thickening most consistent with scarring from previous bouts of diverticulitis versus underdistention. Today, WBC remains wnl, LFTs/bilirubin largely stable/slightly improved. No signs/symptoms of ascending cholangitis, currently on IV antibiotics. Minimal TTP in RUQ and LUQ on exam today.   Patient is going to need ERCP for further evaluation and decompression of biliary tree. I have discussed this with patient today, and he is agreeable to proceed. Subsequently, he will also need cholecystectomy. Timing to be determined per surgical evaluation. Doubt need for MRI to further characterize focal gallbladder abnormality, but will await general surgery recommendations.    Sigmoid wall thickening: Nothing  to suggest colitis at this time. No prior colonoscopy. He will need first ever colonoscopy outpatient for further evaluation.   Plan: ERCP with Dr. Laural Golden today. The risks, benefits, and alternatives have been discussed with the patient in detail. The patient states understanding and desires to proceed.  Appreciate general surgery input.  Continue antibiotics.  Continue supportive measures.  Will need first ever colonoscopy outpatient.    LOS: 1 day    02/25/2021, 8:51 AM  Aliene Altes, PA-C [facility]     Gastroenterology attending attestation note: Agree with the below findings as written. The patient was presented to me by the APP (Advanced Practice Provider) I personally reviewed the medical chart and evaluated the patient. I personally evaluated and examined the patient by myself.  I agree with Mrs. Tivis Ringer Harper's H/P and assessment.  Briefly, this is a 65 year old male with past medical history of diabetes, hypertension, who came to the hospital after presenting new onset jaundice, anorexia and an acute episode of abdominal pain in the right upper quadrant 2 weeks ago.  He also noticed he had some choluria and his stools were paler than usual.  Labs in the ER showed presence of marked derangement of his LFTs with total bilirubin of 15, alkaline phosphatase 355, AST 111, ALT 230.  All of a sudden was 7.7.  Lipase was normal 34.  A CT of the abdomen and pelvis with contrast showed marked intrahepatic and exxtrahepatic biliary duct dilation, CBD measuring up to 10 mm.  Multiple rounded areas of high attenuation within the CBD and common hepatic duct favoring multiple obstructing common bile duct calculi.  Moderate gallbladder distention with focal circumferential wall thickening and enhancement within the body of the gallbladder with differential including focal cholecystitis versus neoplasm  Never had an EGD or colonoscopy in the past.  The patient is  presenting with symptomatic choledocholithiasis.  There is no presence of cholangitis at the moment clinically already his blood work-up.  I discussed with the patient the need to proceed with an ERCP to remove the stones from his biliary tree.  Patient understood and agreed.  This will be performed by Dr. Laural Golden today.  He will need an outpatient colonoscopy for screening purposes.  Will also need general surgery evaluation for cholecystectomy to prevent more episodes of cholelithiasis and for management of gallbladder lesion.  Maylon Peppers, MD Gastroenterology and Hepatology [facility]

## 2021-02-25 NOTE — Progress Notes (Signed)
[facility]  Obstructive jaundice, T bili 18+, thickened gallbladder, dilated biliary system. This is cancer until proven otherwise. Needs MRCP and ERCP.  No role for surgery at this time. Will hold on any consult.   Curlene Labrum, MD [facility] [address] [place], [place] [address] [phone] (office)

## 2021-02-25 NOTE — Op Note (Signed)
[facility] Patient Name: Jonathan Keith Procedure Date: 02/25/2021 11:06 AM MRN: [id number] Date of Birth: 04/11/1956 Attending MD: Hildred Laser , MD CSN: [id number] Age: 65 Admit Type: Inpatient Procedure:                ERCP Indications:              Jaundice Providers:                Hildred Laser, MD, Lambert Mody, Randa Spike, Technician Referring MD:             Dessa Phi, DO Medicines:                General Anesthesia Complications:            No immediate complications. Estimated Blood Loss:     Estimated blood loss: none. Procedure:                Pre-Anesthesia Assessment:                           - Prior to the procedure, a History and Physical                            was performed, and patient medications and                            allergies were reviewed. The patient's tolerance of                            previous anesthesia was also reviewed. The risks                            and benefits of the procedure and the sedation                            options and risks were discussed with the patient.                            All questions were answered, and informed consent                            was obtained. Prior Anticoagulants: The patient has                            taken no previous anticoagulant or antiplatelet                            agents. ASA Grade Assessment: II - A patient with                            mild systemic disease. After reviewing the risks                            and benefits, the  patient was deemed in                            satisfactory condition to undergo the procedure.                           After obtaining informed consent, the scope was                            passed under direct vision. Throughout the                            procedure, the patient's blood pressure, pulse, and                            oxygen saturations were monitored continuously. The                             [employer] D single use                            duodenoscope was introduced through the mouth, and                            used to inject contrast into and used to inject                            contrast into the bile duct. The ERCP was                            accomplished without difficulty. The patient                            tolerated the procedure well. Scope In: 1:33:29 PM Scope Out: 2:21:32 PM Total Procedure Duration: 0 hours 48 minutes 3 seconds  Findings:      The scout film was normal. The esophagus was successfully intubated       under direct vision. The scope was advanced to a normal major papilla in       the descending duodenum without detailed examination of the pharynx,       larynx and associated structures, and upper GI tract. The upper GI tract       was grossly normal. The major papilla was normal. The ventral pancreatic       duct was deeply cannulated with the Hydratome sphincterotome. Contrast       was injected. I personally interpreted the pancreatic duct images. There       was brisk flow of contrast through the ducts. Image quality was       excellent. Contrast extended to the pancreatic duct. The entire       pancreatic duct was normal. The bile duct was deeply cannulated with the       Hydratome sphincterotome. Contrast was injected. The lower third of the       main bile duct and middle third of the main bile duct were normal. The       upper  third of the main bile duct and common hepatic duct contained two       severe stenoses. Distal stricture was about 1 cm long and the proximal       stricture was doubled at length and extended to the level below the       bifurcation. Biliary system dilated upstream. A 6 mm biliary       sphincterotomy was made with a braided Hydratome sphincterotome using       ERBE electrocautery. There was no post-sphincterotomy bleeding. Cells       for cytology were  obtained by brushing in the upper third of the main       bile duct. One 10 Fr by 9 cm plastic stent with a single external flap       and a single internal flap was placed 8 cm into the common bile duct.       Fluid flowed through the stent. Position upstream was not satisfactory.       Therefore the stent was removed and 8.5 French 9 cm plastic stent was       placed across both the strictures. As the stent was deployed contrast       and bile was noted to flow into the duodenum. Impression:               - The major papilla appeared normal.                           - Two high-grade biliary strictures were found in                            the upper third of the main bile duct and common                            hepatic duct. Proximal stricture was about 2 cm                            long and the distal stricture was passed at length.                           - The pancreatogram was normal.                           - A biliary sphincterotomy was performed.                           - Cells for cytology obtained from the bile duct                            strictures..                           - One plastic stent was placed into the common bile                            duct.                           Comment;  given CT findings of focal wall thickening                            gallbladder and the strictures are concerning for                            malignancy Moderate Sedation:      Per Anesthesia Care Recommendation:           - Return patient to hospital ward for ongoing care.                           - Clear liquid diet today.                           - LFTs in am.                           - Avoid aspirin and anticoagulants for 3 days.                           - MRCP in am. Procedure Code(s):        --- Professional ---                           [phone], Endoscopic retrograde                            cholangiopancreatography (ERCP); with placement of                             endoscopic stent into biliary or pancreatic duct,                            including pre- and post-dilation and guide wire                            passage, when performed, including sphincterotomy,                            when performed, each stent Diagnosis Code(s):        --- Professional ---                           R17, Unspecified jaundice CPT copyright 2019 American Medical Association. All rights reserved. The codes documented in this report are preliminary and upon coder review may  be revised to meet current compliance requirements. Hildred Laser, MD Hildred Laser, MD 02/25/2021 5:04:08 PM This report has been signed electronically. Number of Addenda: 0

## 2021-02-26 ENCOUNTER — Encounter (HOSPITAL_COMMUNITY): Payer: Self-pay | Admitting: Internal Medicine

## 2021-02-26 DIAGNOSIS — E785 Hyperlipidemia, unspecified: Secondary | ICD-10-CM | POA: Diagnosis not present

## 2021-02-26 DIAGNOSIS — R17 Unspecified jaundice: Secondary | ICD-10-CM | POA: Diagnosis not present

## 2021-02-26 DIAGNOSIS — K831 Obstruction of bile duct: Secondary | ICD-10-CM | POA: Diagnosis not present

## 2021-02-26 DIAGNOSIS — E1165 Type 2 diabetes mellitus with hyperglycemia: Secondary | ICD-10-CM | POA: Diagnosis not present

## 2021-02-26 LAB — COMPREHENSIVE METABOLIC PANEL
ALT: 209 U/L — ABNORMAL HIGH (ref 0–44)
AST: 120 U/L — ABNORMAL HIGH (ref 15–41)
Albumin: 2.8 g/dL — ABNORMAL LOW (ref 3.5–5.0)
Alkaline Phosphatase: 344 U/L — ABNORMAL HIGH (ref 38–126)
Anion gap: 6 (ref 5–15)
BUN: 14 mg/dL (ref 8–23)
CO2: 27 mmol/L (ref 22–32)
Calcium: 9.2 mg/dL (ref 8.9–10.3)
Chloride: 97 mmol/L — ABNORMAL LOW (ref 98–111)
Creatinine, Ser: 0.61 mg/dL (ref 0.61–1.24)
GFR, Estimated: >60 mL/min (ref >60)
Glucose, Bld: 200 mg/dL — ABNORMAL HIGH (ref 70–99)
Potassium: 4.3 mmol/L (ref 3.5–5.1)
Sodium: 130 mmol/L — ABNORMAL LOW (ref 135–145)
Total Bilirubin: 20.9 mg/dL (ref 0.3–1.2)
Total Protein: 6.5 g/dL (ref 6.5–8.1)

## 2021-02-26 LAB — CBC
HCT: 36.9 % — ABNORMAL LOW (ref 39.0–52.0)
Hemoglobin: 12.4 g/dL — ABNORMAL LOW (ref 13.0–17.0)
MCH: 31.9 pg (ref 26.0–34.0)
MCHC: 33.6 g/dL (ref 30.0–36.0)
MCV: 94.9 fL (ref 80.0–100.0)
Platelets: 227 10*3/uL (ref 150–400)
RBC: 3.89 MIL/uL — ABNORMAL LOW (ref 4.22–5.81)
RDW: 14.4 % (ref 11.5–15.5)
WBC: 6 10*3/uL (ref 4.0–10.5)
nRBC: 0 % (ref 0.0–0.2)

## 2021-02-26 LAB — GLUCOSE, CAPILLARY
Glucose-Capillary: 181 mg/dL — ABNORMAL HIGH (ref 70–99)
Glucose-Capillary: 183 mg/dL — ABNORMAL HIGH (ref 70–99)
Glucose-Capillary: 194 mg/dL — ABNORMAL HIGH (ref 70–99)
Glucose-Capillary: 192 mg/dL — ABNORMAL HIGH (ref 70–99)
Glucose-Capillary: 142 mg/dL — ABNORMAL HIGH (ref 70–99)

## 2021-02-26 LAB — CYTOLOGY - NON PAP

## 2021-02-26 NOTE — Progress Notes (Signed)
Inpatient Diabetes Program Recommendations  AACE/ADA: New Consensus Statement on Inpatient Glycemic Control (2015)  Target Ranges:  Prepandial:   less than 140 mg/dL      Peak postprandial:   less than 180 mg/dL (1-2 hours)      Critically ill patients:  140 - 180 mg/dL   Lab Results  Component Value Date   GLUCAP 183 (H) 02/26/2021   HGBA1C 8.7 (H) 02/24/2021    Review of Glycemic Control Results for IVAR, DOMANGUE (MRN [id number]) as of 02/26/2021 09:51  Ref. Range 02/24/2021 20:47 02/25/2021 11:57 02/25/2021 17:05 02/25/2021 21:25 02/26/2021 07:23  Glucose-Capillary Latest Ref Range: 70 - 99 mg/dL 258 (H) 140 (H) 195 (H) 274 (H) 183 (H)   Diabetes history: DM 2 Outpatient Diabetes medications:  Metformin XR 500 mg daily Current orders for Inpatient glycemic control:  Novolog moderate tid with meals and HS  Inpatient Diabetes Program Recommendations:    If appropriate, may consider adding Lantus 5 units daily.    Thanks,  Adah Perl, RN, BC-ADM Inpatient Diabetes Coordinator Pager [phone]  (8a-5p)

## 2021-02-26 NOTE — Progress Notes (Signed)
Subjective:  Patient has no complaints.  He denies nausea vomiting abdominal pain or pruritus.  Current Medications:  Current Facility-Administered Medications:    acetaminophen (TYLENOL) tablet 650 mg, 650 mg, Oral, Q6H PRN, 650 mg at 02/24/21 2135 **OR** acetaminophen (TYLENOL) suppository 650 mg, 650 mg, Rectal, Q6H PRN, Angelee Bahr U, MD   hydrALAZINE (APRESOLINE) injection 10 mg, 10 mg, Intravenous, Q2H PRN, Nashea Chumney U, MD   insulin aspart (novoLOG) injection 0-15 Units, 0-15 Units, Subcutaneous, TID WC, Trudy Kory U, MD, 3 Units at 02/26/21 1319   insulin aspart (novoLOG) injection 0-5 Units, 0-5 Units, Subcutaneous, QHS, Chelse Matas U, MD, 3 Units at 02/25/21 2139   lactated ringers infusion, , Intravenous, Continuous, Barton Dubois, MD, Last Rate: 100 mL/hr at 02/26/21 0911, Rate Change at 02/26/21 0911   morphine 2 MG/ML injection 2 mg, 2 mg, Intravenous, Q2H PRN, Messiah Ahr U, MD   ondansetron (ZOFRAN-ODT) disintegrating tablet 4 mg, 4 mg, Oral, Q6H PRN **OR** ondansetron (ZOFRAN) injection 4 mg, 4 mg, Intravenous, Q6H PRN, Lenea Bywater U, MD   oxyCODONE (Oxy IR/ROXICODONE) immediate release tablet 5-10 mg, 5-10 mg, Oral, Q4H PRN, Correen Bubolz U, MD   piperacillin-tazobactam (ZOSYN) IVPB 3.375 g, 3.375 g, Intravenous, Q8H, Duvid Smalls U, MD, Last Rate: 12.5 mL/hr at 02/26/21 1318, 3.375 g at 02/26/21 1318   Objective: Blood pressure 130/74, pulse 71, temperature 98 F (36.7 C), temperature source Oral, resp. rate 18, height _0  (1.803 m), weight 85.9 kg, SpO2 98 %. Patient is alert and in no acute distress. Patient remains deeply jaundiced. Abdomen.  Umbilical hernia as before.  Abdomen is soft and nontender with organomegaly or masses. No LE edema or clubbing noted.  Labs/studies Results:   CBC Latest Ref Rng & Units 02/26/2021 02/25/2021 02/24/2021  WBC 4.0 - 10.5 K/uL 6.0 6.4 -  Hemoglobin 13.0 - 17.0 g/dL 12.4(L) 12.5(L) 15.0  Hematocrit  39.0 - 52.0 % 36.9(L) 36.5(L) 44.0  Platelets 150 - 400 K/uL 227 194 -    CMP Latest Ref Rng & Units 02/26/2021 02/25/2021 02/24/2021  Glucose 70 - 99 mg/dL 200(H) 179(H) 196(H)  BUN 8 - 23 mg/dL _1 Creatinine 0.61 - 1.24 mg/dL 0.61 0.54(L) 0.90  Sodium 135 - 145 mmol/L 130(L) 129(L) 135  Potassium 3.5 - 5.1 mmol/L 4.3 3.9 4.2  Chloride 98 - 111 mmol/L 97(L) 99 105  CO2 22 - 32 mmol/L 27 23 -  Calcium 8.9 - 10.3 mg/dL 9.2 9.0 -  Total Protein 6.5 - 8.1 g/dL 6.5 6.3(L) -  Total Bilirubin 0.3 - 1.2 mg/dL 20.9(HH) 17.6(H) -  Alkaline Phos 38 - 126 U/L 344(H) 318(H) -  AST 15 - 41 U/L 120(H) 115(H) -  ALT 0 - 44 U/L 209(H) 208(H) -    Hepatic Function Latest Ref Rng & Units 02/26/2021 02/25/2021 02/24/2021  Total Protein 6.5 - 8.1 g/dL 6.5 6.3(L) 7.1  Albumin 3.5 - 5.0 g/dL 2.8(L) 2.8(L) 3.2(L)  AST 15 - 41 U/L 120(H) 115(H) 111(H)  ALT 0 - 44 U/L 209(H) 208(H) 230(H)  Alk Phosphatase 38 - 126 U/L 344(H) 318(H) 355(H)  Total Bilirubin 0.3 - 1.2 mg/dL 20.9(HH) 17.6(H) 18.0(H)    MRCP images reviewed with Dr. Lavonia Dana. 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  Moderate intrahepatic ductal dilation.  Left and right intrahepatic ducts do not communicate. 3.5 cm gallstone with adjacent wall thickening and mid gallbladder and surrounding enhancement felt to be due to  infection rather than neoplasm. CBD 6 mm without stones.  Bile duct stricture brushing cytology  Malignant cells consistent with adenocarcinoma.   Assessment:  #1.  Obstructive jaundice secondary to what appears to be cholangiocarcinoma.  MRCP reveals soft tissue mass.  Brushing cytology obtained at time of ERCP yesterday reveals malignant cells consistent with adenocarcinoma.  Patient's bilirubin has gone up.  Unfortunately plastic stent is not working.  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  He will therefore need stenting to both left and the right system.  Will need  uncovered stents. Diagnosis discussed in detail with patient, his wife and their son Dian Situ who is at bedside.  I have informed them that access to left system may be difficult.  If both sides cannot be stenting he will need percutaneous approach. Once biliary system is decompressed we will arrange for oncologic and surgical consultation.   Plan:  Check CEA and CA 19-9. Repeat ERCP on 02/27/2021 with removal of plastic stent and placement of wallstents in left as well as right hepatic system.

## 2021-02-26 NOTE — Progress Notes (Signed)
Critical Value total bilirubin= 20.9, MD paged.

## 2021-02-26 NOTE — Progress Notes (Signed)
PROGRESS NOTE    [name]  YIR:[id number] DOB: [birth year] DOA: 02/24/2021 PCP: [name], PA-C     Brief Narrative:  Jonathan Keith is a 65 y.o. male with medical history significant of claudication presenting with jaundice.  He states that for about a week, patient has continued to feel fatigue and weakness.  Most concerned about the color of his urine, then his coworker started to comment that his skin was turning yellow.  He went to go see his primary care physician, and was instructed to seek care in the emergency department.  Work-up with CT revealed biliary obstruction with abnormal appearing gallbladder.  GI as well as general surgery were consulted.  New events last 24 hours / Subjective: No new events overnight.  Denies chest pain, no nausea, no vomiting, no abdominal pain.  Positive jaundice and elevated bilirubin appreciated.  Assessment & Plan:   Principal Problem:   Obstructive jaundice Active Problems:   Diabetes mellitus (HCC)   Dyslipidemia   Hypertension   Tobacco dependence   Elevated LFTs   Choledocholithiasis   Obstructive jaundice with elevated liver function test and hyperbilirubinemia -CT A/P: Intrahepatic and extrahepatic biliary duct dilation, with multiple small high attenuation obstructing foci within the common hepatic and common bile duct. Moderate gallbladder distension, with focal circumferential wall thickening and enhancement within the body of the gallbladder. Differential diagnosis includes focal cholecystitis versus neoplasm. -Status post ERCP by GI; cytology brushes suggesting presence of adenocarcinoma. -Plan is for double stenting to be placed 02/27/2021 -Continue to follow GI service recommendations. -Continue the use of empiric IV Zosyn  Diabetes mellitus with hyperglycemia -Hemoglobin A1c 8.7 -Continue holding oral hypoglycemic agents -Continue sliding scale insulin.  Hyperlipidemia -Continue holding Pravachol in setting  of elevated liver function  DVT prophylaxis:  SCDs Start: 02/24/21 1907  Code Status:     Code Status Orders  (From admission, onward)           Start     Ordered   02/24/21 1907  Full code  Continuous        02/24/21 1907           Code Status History     This patient has a current code status but no historical code status.      Advance Directive Documentation    Flowsheet Row Most Recent Value  Type of Advance Directive Healthcare Power of Attorney  Pre-existing out of facility DNR order (yellow form or pink MOST form) --  "MOST" Form in Place? --      Family Communication: Spouse and son at bedside  Disposition Plan:  Status is: Inpatient  Remains inpatient appropriate because:Inpatient level of care appropriate due to severity of illness  Dispo: The patient is from: Home              Anticipated d/c is to: Home              Patient currently is not medically stable to d/c.   Difficult to place patient No   Consultants:  GI  Procedures:  ERCP 7/19  Antimicrobials:  Anti-infectives (From admission, onward)    Start     Dose/Rate Route Frequency Ordered Stop   02/24/21 2200  piperacillin-tazobactam (ZOSYN) IVPB 3.375 g  Status:  Discontinued        3.375 g 12.5 mL/hr over 240 Minutes Intravenous Every 8 hours 02/24/21 2003 02/24/21 2004   02/24/21 2100  piperacillin-tazobactam (ZOSYN) IVPB 3.375 g  3.375 g 12.5 mL/hr over 240 Minutes Intravenous Every 8 hours 02/24/21 2004     02/24/21 1745  cefTRIAXone (ROCEPHIN) 1 g in sodium chloride 0.9 % 100 mL IVPB        1 g 200 mL/hr over 30 Minutes Intravenous  Once 02/24/21 1736 02/24/21 1815        Objective: Vitals:   02/25/21 1959 02/25/21 2121 02/26/21 0535 02/26/21 1300  BP:  118/72 (!) 153/88 130/74  Pulse:  88 67 71  Resp:  19 20 18   Temp:  98.2 F (36.8 C) 97.6 F (36.4 C) 98 F (36.7 C)  TempSrc:  Oral Oral Oral  SpO2: 93% 95% 96% 98%  Weight:      Height:         Intake/Output Summary (Last 24 hours) at 02/26/2021 1529 Last data filed at 02/26/2021 0700 Gross per 24 hour  Intake 1168 ml  Output --  Net 1168 ml   Filed Weights   02/24/21 1211 02/24/21 1905  Weight: 104.8 kg 85.9 kg    Examination:  General exam: Alert, awake, oriented x 3; positive jaundice, no abdominal pain, no nausea, no vomiting. Respiratory system: Clear to auscultation. Respiratory effort normal.  No using accessory muscle.  Good saturation on room air. Cardiovascular system:RRR.  No rubs, no gallops, no JVD. Gastrointestinal system: Abdomen is nondistended, soft, nontender, positive bowel sounds. Central nervous system: Alert and oriented. No focal neurological deficits. Extremities: No cyanosis or clubbing. Skin: No rashes, lesions or ulcers Psychiatry: Judgement and insight appear normal. Mood & affect appropriate.   Data Reviewed: I have personally reviewed following labs and imaging studies  CBC: Recent Labs  Lab 02/24/21 1223 02/24/21 1233 02/25/21 0346 02/26/21 0356  WBC 7.7  --  6.4 6.0  NEUTROABS 4.3  --   --   --   HGB 13.3 15.0 12.5* 12.4*  HCT 39.5 44.0 36.5* 36.9*  MCV 93.8  --  95.3 94.9  PLT 183  --  194 546   Basic Metabolic Panel: Recent Labs  Lab 02/24/21 1223 02/24/21 1233 02/25/21 0346 02/26/21 0356  NA 130* 135 129* 130*  K 4.1 4.2 3.9 4.3  CL 100 105 99 97*  CO2 22  --  23 27  GLUCOSE 194* 196* 179* 200*  BUN 20 19 17 14   CREATININE 0.57* 0.90 0.54* 0.61  CALCIUM 9.4  --  9.0 9.2   GFR: Estimated Creatinine Clearance: 98 mL/min (by C-G formula based on SCr of 0.61 mg/dL).  Liver Function Tests: Recent Labs  Lab 02/24/21 1223 02/25/21 0346 02/26/21 0356  AST 111* 115* 120*  ALT 230* 208* 209*  ALKPHOS 355* 318* 344*  BILITOT 18.0* 17.6* 20.9*  PROT 7.1 6.3* 6.5  ALBUMIN 3.2* 2.8* 2.8*   Recent Labs  Lab 02/24/21 1223  LIPASE 34    HbA1C: Recent Labs    02/24/21 1223  HGBA1C 8.7*   CBG: Recent Labs   Lab 02/25/21 1157 02/25/21 1705 02/25/21 2125 02/26/21 0723 02/26/21 1122  GLUCAP 140* 195* 274* 183* 194*    Recent Results (from the past 240 hour(s))  Resp Panel by RT-PCR (Flu A&B, Covid) Nasopharyngeal Swab     Status: None   Collection Time: 02/24/21  5:36 PM   Specimen: Nasopharyngeal Swab; Nasopharyngeal(NP) swabs in vial transport medium  Result Value Ref Range Status   SARS Coronavirus 2 by RT PCR NEGATIVE NEGATIVE Final    Comment: (NOTE) SARS-CoV-2 target nucleic acids are NOT DETECTED.  The  SARS-CoV-2 RNA is generally detectable in upper respiratory specimens during the acute phase of infection. The lowest concentration of SARS-CoV-2 viral copies this assay can detect is 138 copies/mL. A negative result does not preclude SARS-Cov-2 infection and should not be used as the sole basis for treatment or other patient management decisions. A negative result may occur with  improper specimen collection/handling, submission of specimen other than nasopharyngeal swab, presence of viral mutation(s) within the areas targeted by this assay, and inadequate number of viral copies(<138 copies/mL). A negative result must be combined with clinical observations, patient history, and epidemiological information. The expected result is Negative.  Fact Sheet for Patients:  EntrepreneurPulse.com.au  Fact Sheet for Healthcare Providers:  IncredibleEmployment.be  This test is no t yet approved or cleared by the [place] FDA and  has been authorized for detection and/or diagnosis of SARS-CoV-2 by FDA under an Emergency Use Authorization (EUA). This EUA will remain  in effect (meaning this test can be used) for the duration of the COVID-19 declaration under Section 564(b)(1) of the Act, 21 U.S.C.section 360bbb-3(b)(1), unless the authorization is terminated  or revoked sooner.       Influenza A by PCR NEGATIVE NEGATIVE Final   Influenza B  by PCR NEGATIVE NEGATIVE Final    Comment: (NOTE) The Xpert Xpress SARS-CoV-2/FLU/RSV plus assay is intended as an aid in the diagnosis of influenza from Nasopharyngeal swab specimens and should not be used as a sole basis for treatment. Nasal washings and aspirates are unacceptable for Xpert Xpress SARS-CoV-2/FLU/RSV testing.  Fact Sheet for Patients: EntrepreneurPulse.com.au  Fact Sheet for Healthcare Providers: IncredibleEmployment.be  This test is not yet approved or cleared by the [place] FDA and has been authorized for detection and/or diagnosis of SARS-CoV-2 by FDA under an Emergency Use Authorization (EUA). This EUA will remain in effect (meaning this test can be used) for the duration of the COVID-19 declaration under Section 564(b)(1) of the Act, 21 U.S.C. section 360bbb-3(b)(1), unless the authorization is terminated or revoked.  Performed at [facility], [address], [place], [place] [address]      Radiology Studies: MR 3D Recon At Scanner  Result Date: 02/25/2021 CLINICAL DATA:  Jaundice, focal gallbladder wall thickening on CT EXAM: MRI ABDOMEN WITHOUT AND WITH CONTRAST (INCLUDING MRCP) TECHNIQUE: Multiplanar multisequence MR imaging of the abdomen was performed both before and after the administration of intravenous contrast. Heavily T2-weighted images of the biliary and pancreatic ducts were obtained, and three-dimensional MRCP images were rendered by post processing. CONTRAST:  39mL GADAVIST GADOBUTROL 1 MMOL/ML IV SOLN COMPARISON:  CT abdomen/pelvis dated 02/24/2021 FINDINGS: Lower chest: Lung bases are clear. Hepatobiliary: Liver is within normal limits. No suspicious/enhancing hepatic lesions. However, there is associated moderate intrahepatic ductal dilatation with suspected abnormal enhancing soft tissue at the hilar confluence measuring 12 mm (series 23/image 43), raising the concern for a small hilar  cholangiocarcinoma/Klatskin tumor. Gallbladder is notable for a 3.5 cm gallstone (series 4/image 20). There is associated focal wall thickening with narrowing/stenosis (series 4/image 19) in the mid gallbladder, with enhancement (series 18/image 40) in this region and proximally along the gallstone. While neoplasm remains possible, this overall appearance favors reactive inflammation secondary to the large gallstone. Distal CBD is nondilated, measuring 6 mm. No choledocholithiasis is seen. Pancreas:  Within normal limits. Spleen:  Within normal limits. Adrenals/Urinary Tract:  Adrenal glands are within normal limits. Kidneys are within normal limits.  No hydronephrosis. Stomach/Bowel: Stomach is within normal limits. Visualized bowel is unremarkable. Vascular/Lymphatic:  No evidence of abdominal aortic aneurysm. No suspicious abdominal lymphadenopathy. Other:  No abdominal ascites. Musculoskeletal: No focal osseous lesions. IMPRESSION: Suspected 12 mm enhancing soft tissue lesion at the hilar confluence, raise concern for hilar cholangiocarcinoma/Klatskin tumor. Associated moderate intrahepatic ductal dilatation. 3.5 cm gallstone. Adjacent wall thickening/narrowing of the mid gallbladder, with surrounding enhancement, favored to reflect reactive inflammation. Gallbladder neoplasm remains possible but is considered less likely. Distal CBD is nondilated, measuring 6 mm. No choledocholithiasis is seen. Electronically Signed   By: Julian Hy M.D.   On: 02/25/2021 20:09   DG ERCP  Result Date: 02/25/2021 CLINICAL DATA:  Jaundice EXAM: ERCP TECHNIQUE: Multiple spot images obtained with the fluoroscopic device and submitted for interpretation post-procedure. COMPARISON:  CT 02/24/2021 FINDINGS: Series of fluoroscopic spot images document endoscopic cannulation and opacification of the pancreatic duct and subsequently the biliary tree. There is segmental narrowing of proximal and mid CBD on limited images.  Subsequent images document placement of plastic biliary stent. The intrahepatic biliary tree is incompletely opacified, appearing dilated centrally. IMPRESSION: 1. Segmental CBD narrowing, with endoscopic stent placement. These images were submitted for radiologic interpretation only. Please see the procedural report for the amount of contrast and the fluoroscopy time utilized. Electronically Signed   By: Lucrezia Europe M.D.   On: 02/25/2021 15:02   MR ABDOMEN MRCP W WO CONTAST  Result Date: 02/25/2021 CLINICAL DATA:  Jaundice, focal gallbladder wall thickening on CT EXAM: MRI ABDOMEN WITHOUT AND WITH CONTRAST (INCLUDING MRCP) TECHNIQUE: Multiplanar multisequence MR imaging of the abdomen was performed both before and after the administration of intravenous contrast. Heavily T2-weighted images of the biliary and pancreatic ducts were obtained, and three-dimensional MRCP images were rendered by post processing. CONTRAST:  36mL GADAVIST GADOBUTROL 1 MMOL/ML IV SOLN COMPARISON:  CT abdomen/pelvis dated 02/24/2021 FINDINGS: Lower chest: Lung bases are clear. Hepatobiliary: Liver is within normal limits. No suspicious/enhancing hepatic lesions. However, there is associated moderate intrahepatic ductal dilatation with suspected abnormal enhancing soft tissue at the hilar confluence measuring 12 mm (series 23/image 43), raising the concern for a small hilar cholangiocarcinoma/Klatskin tumor. Gallbladder is notable for a 3.5 cm gallstone (series 4/image 20). There is associated focal wall thickening with narrowing/stenosis (series 4/image 19) in the mid gallbladder, with enhancement (series 18/image 40) in this region and proximally along the gallstone. While neoplasm remains possible, this overall appearance favors reactive inflammation secondary to the large gallstone. Distal CBD is nondilated, measuring 6 mm. No choledocholithiasis is seen. Pancreas:  Within normal limits. Spleen:  Within normal limits. Adrenals/Urinary  Tract:  Adrenal glands are within normal limits. Kidneys are within normal limits.  No hydronephrosis. Stomach/Bowel: Stomach is within normal limits. Visualized bowel is unremarkable. Vascular/Lymphatic:  No evidence of abdominal aortic aneurysm. No suspicious abdominal lymphadenopathy. Other:  No abdominal ascites. Musculoskeletal: No focal osseous lesions. IMPRESSION: Suspected 12 mm enhancing soft tissue lesion at the hilar confluence, raise concern for hilar cholangiocarcinoma/Klatskin tumor. Associated moderate intrahepatic ductal dilatation. 3.5 cm gallstone. Adjacent wall thickening/narrowing of the mid gallbladder, with surrounding enhancement, favored to reflect reactive inflammation. Gallbladder neoplasm remains possible but is considered less likely. Distal CBD is nondilated, measuring 6 mm. No choledocholithiasis is seen. Electronically Signed   By: Julian Hy M.D.   On: 02/25/2021 20:09      Scheduled Meds:  insulin aspart  0-15 Units Subcutaneous TID WC   insulin aspart  0-5 Units Subcutaneous QHS   Continuous Infusions:  lactated ringers 100 mL/hr at 02/26/21 0911   piperacillin-tazobactam (ZOSYN)  IV 3.375 g (02/26/21 1318)     LOS: 2 days    Time spent: 30 minutes   Barton Dubois, MD [facility] 02/26/2021, 3:29 PM   Available via Epic secure chat 7am-7pm After these hours, please refer to coverage provider listed on [email or web address]

## 2021-02-27 ENCOUNTER — Inpatient Hospital Stay (HOSPITAL_COMMUNITY): Payer: Commercial Managed Care - PPO

## 2021-02-27 ENCOUNTER — Encounter (HOSPITAL_COMMUNITY)
Admission: EM | Disposition: A | Discharge status: Home / Self Care | Payer: Self-pay | Source: Home / Self Care | Attending: Internal Medicine

## 2021-02-27 ENCOUNTER — Inpatient Hospital Stay (HOSPITAL_COMMUNITY): Payer: Commercial Managed Care - PPO | Admitting: Anesthesiology

## 2021-02-27 ENCOUNTER — Other Ambulatory Visit: Payer: Self-pay

## 2021-02-27 DIAGNOSIS — E785 Hyperlipidemia, unspecified: Secondary | ICD-10-CM | POA: Diagnosis not present

## 2021-02-27 DIAGNOSIS — K831 Obstruction of bile duct: Secondary | ICD-10-CM | POA: Diagnosis not present

## 2021-02-27 DIAGNOSIS — E1165 Type 2 diabetes mellitus with hyperglycemia: Secondary | ICD-10-CM | POA: Diagnosis not present

## 2021-02-27 DIAGNOSIS — R17 Unspecified jaundice: Secondary | ICD-10-CM | POA: Diagnosis not present

## 2021-02-27 HISTORY — PX: STENT REMOVAL: SHX6421

## 2021-02-27 HISTORY — PX: ERCP: SHX5425

## 2021-02-27 HISTORY — PX: BILIARY STENT PLACEMENT: SHX5538

## 2021-02-27 LAB — COMPREHENSIVE METABOLIC PANEL
ALT: 245 U/L — ABNORMAL HIGH (ref 0–44)
AST: 204 U/L — ABNORMAL HIGH (ref 15–41)
Albumin: 2.5 g/dL — ABNORMAL LOW (ref 3.5–5.0)
Alkaline Phosphatase: 302 U/L — ABNORMAL HIGH (ref 38–126)
Anion gap: 7 (ref 5–15)
BUN: 20 mg/dL (ref 8–23)
CO2: 22 mmol/L (ref 22–32)
Calcium: 8.4 mg/dL — ABNORMAL LOW (ref 8.9–10.3)
Chloride: 100 mmol/L (ref 98–111)
Creatinine, Ser: 0.73 mg/dL (ref 0.61–1.24)
GFR, Estimated: >60 mL/min (ref >60)
Glucose, Bld: 149 mg/dL — ABNORMAL HIGH (ref 70–99)
Potassium: 3.9 mmol/L (ref 3.5–5.1)
Sodium: 129 mmol/L — ABNORMAL LOW (ref 135–145)
Total Bilirubin: 19.3 mg/dL (ref 0.3–1.2)
Total Protein: 5.8 g/dL — ABNORMAL LOW (ref 6.5–8.1)

## 2021-02-27 LAB — PROTIME-INR
INR: 1 (ref 0.8–1.2)
Prothrombin Time: 12.8 seconds (ref 11.4–15.2)

## 2021-02-27 LAB — GLUCOSE, CAPILLARY
Glucose-Capillary: 129 mg/dL — ABNORMAL HIGH (ref 70–99)
Glucose-Capillary: 110 mg/dL — ABNORMAL HIGH (ref 70–99)
Glucose-Capillary: 109 mg/dL — ABNORMAL HIGH (ref 70–99)
Glucose-Capillary: 201 mg/dL — ABNORMAL HIGH (ref 70–99)
Glucose-Capillary: 247 mg/dL — ABNORMAL HIGH (ref 70–99)

## 2021-02-27 SURGERY — ERCP, WITH INTERVENTION IF INDICATED
Anesthesia: General

## 2021-02-27 MED ORDER — ROCURONIUM BROMIDE 10 MG/ML (PF) SYRINGE
PREFILLED_SYRINGE | INTRAVENOUS | Status: DC | PRN
  Administered 2021-02-27: 50 mg via INTRAVENOUS

## 2021-02-27 MED ORDER — ONDANSETRON HCL 4 MG/2ML IJ SOLN: 4.0000 mg | Freq: Once | INTRAMUSCULAR | Status: DC | PRN

## 2021-02-27 MED ORDER — LIDOCAINE HCL (PF) 2 % IJ SOLN
INTRAMUSCULAR | Status: AC
  Filled 2021-02-27: qty 5

## 2021-02-27 MED ORDER — DEXAMETHASONE SODIUM PHOSPHATE 10 MG/ML IJ SOLN
INTRAMUSCULAR | Status: AC
  Filled 2021-02-27: qty 1

## 2021-02-27 MED ORDER — GLUCAGON HCL RDNA (DIAGNOSTIC) 1 MG IJ SOLR
INTRAMUSCULAR | Status: AC
  Filled 2021-02-27: qty 2

## 2021-02-27 MED ORDER — LIDOCAINE HCL (CARDIAC) PF 100 MG/5ML IV SOSY
PREFILLED_SYRINGE | INTRAVENOUS | Status: DC | PRN
  Administered 2021-02-27: 80 mg via INTRATRACHEAL

## 2021-02-27 MED ORDER — SODIUM CHLORIDE 0.9 % IV SOLN
INTRAVENOUS | Status: DC | PRN
  Administered 2021-02-27: 50 mL

## 2021-02-27 MED ORDER — LACTATED RINGERS IV SOLN: INTRAVENOUS | Status: DC

## 2021-02-27 MED ORDER — MEPERIDINE HCL 50 MG/ML IJ SOLN: 6.2500 mg | INTRAMUSCULAR | Status: DC | PRN

## 2021-02-27 MED ORDER — DEXAMETHASONE SODIUM PHOSPHATE 10 MG/ML IJ SOLN
INTRAMUSCULAR | Status: DC | PRN
  Administered 2021-02-27: 5 mg via INTRAVENOUS

## 2021-02-27 MED ORDER — MIDAZOLAM HCL 2 MG/2ML IJ SOLN
INTRAMUSCULAR | Status: AC
  Filled 2021-02-27: qty 2

## 2021-02-27 MED ORDER — CHLORHEXIDINE GLUCONATE 0.12 % MT SOLN: 15.0000 mL | Freq: Once | OROMUCOSAL | Status: DC

## 2021-02-27 MED ORDER — STERILE WATER FOR IRRIGATION IR SOLN
Status: DC | PRN
  Administered 2021-02-27: 1000 mL

## 2021-02-27 MED ORDER — SODIUM CHLORIDE 0.9 % IV SOLN: INTRAVENOUS | Status: DC

## 2021-02-27 MED ORDER — ONDANSETRON HCL 4 MG/2ML IJ SOLN
INTRAMUSCULAR | Status: DC | PRN
  Administered 2021-02-27: 4 mg via INTRAVENOUS

## 2021-02-27 MED ORDER — SIMETHICONE 40 MG/0.6ML PO SUSP
ORAL | Status: AC
  Filled 2021-02-27: qty 0.6

## 2021-02-27 MED ORDER — SODIUM CHLORIDE 0.9 % IV SOLN
INTRAVENOUS | Status: AC
  Administered 2021-02-27: 250 mL
  Filled 2021-02-27: qty 50

## 2021-02-27 MED ORDER — SUGAMMADEX SODIUM 500 MG/5ML IV SOLN
INTRAVENOUS | Status: DC | PRN
  Administered 2021-02-27: 200 mg via INTRAVENOUS

## 2021-02-27 MED ORDER — ROCURONIUM BROMIDE 10 MG/ML (PF) SYRINGE
PREFILLED_SYRINGE | INTRAVENOUS | Status: AC
  Filled 2021-02-27: qty 10

## 2021-02-27 MED ORDER — FENTANYL CITRATE (PF) 100 MCG/2ML IJ SOLN
INTRAMUSCULAR | Status: AC
  Filled 2021-02-27: qty 2

## 2021-02-27 MED ORDER — ORAL CARE MOUTH RINSE: 15.0000 mL | Freq: Once | OROMUCOSAL | Status: DC

## 2021-02-27 MED ORDER — FENTANYL CITRATE (PF) 100 MCG/2ML IJ SOLN
INTRAMUSCULAR | Status: DC | PRN
  Administered 2021-02-27: 50 ug via INTRAVENOUS

## 2021-02-27 MED ORDER — PHENYLEPHRINE HCL (PRESSORS) 10 MG/ML IV SOLN
INTRAVENOUS | Status: DC | PRN
  Administered 2021-02-27 (×8): 80 ug via INTRAVENOUS

## 2021-02-27 MED ORDER — HYDROMORPHONE HCL 1 MG/ML IJ SOLN
0.2500 mg | INTRAMUSCULAR | Status: DC | PRN
  Administered 2021-02-27: 0.5 mg via INTRAVENOUS
  Administered 2021-02-27: 0.25 mg via INTRAVENOUS
  Filled 2021-02-27: qty 0.5

## 2021-02-27 MED ORDER — MIDAZOLAM HCL 2 MG/2ML IJ SOLN
INTRAMUSCULAR | Status: DC | PRN
  Administered 2021-02-27: 2 mg via INTRAVENOUS

## 2021-02-27 MED ORDER — PROPOFOL 10 MG/ML IV BOLUS
INTRAVENOUS | Status: DC | PRN
  Administered 2021-02-27: 200 mg via INTRAVENOUS

## 2021-02-27 MED ORDER — ONDANSETRON HCL 4 MG/2ML IJ SOLN
INTRAMUSCULAR | Status: AC
  Filled 2021-02-27: qty 2

## 2021-02-27 SURGICAL SUPPLY — 22 items
BALLN RETRIEVAL 12X15 (BALLOONS) IMPLANT
BASKET TRAPEZOID 3X6 (MISCELLANEOUS) IMPLANT
BASKET TRAPEZOID LITHO 2.0X5 (MISCELLANEOUS) IMPLANT
DEVICE INFLATION ENCORE 26 (MISCELLANEOUS) IMPLANT
DEVICE LOCKING W-BIOPSY CAP (MISCELLANEOUS) IMPLANT
GUIDEWIRE HYDRA JAGWIRE .35 (WIRE) IMPLANT
GUIDEWIRE JAG HINI 025X260CM (WIRE) IMPLANT
KIT ENDO PROCEDURE PEN (KITS) ×3 IMPLANT
KIT TURNOVER KIT A (KITS) ×3 IMPLANT
LUBRICANT JELLY 4.5OZ STERILE (MISCELLANEOUS) IMPLANT
PAD ARMBOARD 7.5X6 YLW CONV (MISCELLANEOUS) ×3 IMPLANT
POSITIONER HEAD 8X9X4 ADT (SOFTGOODS) IMPLANT
SCOPE SPY DS DISPOSABLE (MISCELLANEOUS) IMPLANT
SNARE ROTATE MED OVAL 20MM (MISCELLANEOUS) IMPLANT
SNARE SHORT THROW 13M SML OVAL (MISCELLANEOUS) IMPLANT
SPHINCTEROTOME AUTOTOME .25 (MISCELLANEOUS) IMPLANT
SPHINCTEROTOME HYDRATOME 44 (MISCELLANEOUS) IMPLANT
SYSTEM CONTINUOUS INJECTION (MISCELLANEOUS) IMPLANT
TUBING INSUFFLATOR CO2MPACT (TUBING) ×3 IMPLANT
WALLSTENT METAL COVERED 10X60 (STENTS) IMPLANT
WALLSTENT METAL COVERED 10X80 (STENTS) IMPLANT
WATER STERILE IRR 1000ML POUR (IV SOLUTION) ×3 IMPLANT

## 2021-02-27 NOTE — Progress Notes (Signed)
Brief ERCP note  Prepyloric gastritis with single erosion Biliary stent in place.  Stent was removed with help of polypectomy snare. Cholangiogram obtained to outline bile duct stricture and intrahepatic system.  Only right side felt pain was dilated.  035 guidewire left in place. Sphincterotome gree loaded with revolution guidewire but unable to access left hepatic duct which is completely occluded. Hilar stricture dilated with HurriCaine balloon to 6 mm. 10 French 9 cm long plastic stent placed across the stricture. Proximal margin of the stent well above the stricture. As stent was deployed and there was free flow of contrast into the duodenum.  Patient tolerated the procedure well.

## 2021-02-27 NOTE — Anesthesia Postprocedure Evaluation (Signed)
Anesthesia Post Note  Patient: DUDLEY MAGES  Procedure(s) Performed: ENDOSCOPIC RETROGRADE CHOLANGIOPANCREATOGRAPHY (ERCP) BILIARY STENT PLACEMENT (10FR x 9cm) IN THE RIGHT SYSTEM STENT REMOVAL (8.5Fr x 9cm) Balloon dilation wire-guided  Patient location during evaluation: PACU Anesthesia Type: General Level of consciousness: awake and alert and oriented Pain management: pain level controlled Vital Signs Assessment: post-procedure vital signs reviewed and stable Respiratory status: spontaneous breathing and respiratory function stable Cardiovascular status: blood pressure returned to baseline and stable Postop Assessment: no apparent nausea or vomiting Anesthetic complications: no Comments: C/O chest tightness, as per patient he had tightness before the procedure, 12 lead ekg done, no acute changes.   No notable events documented.   Last Vitals:  Vitals:   02/27/21 1430 02/27/21 1458  BP: (!) 171/91 (!) 155/81  Pulse: 70 71  Resp: 15   Temp:  36.5 C  SpO2: 99% 99%    Last Pain:  Vitals:   02/27/21 1458  TempSrc: Oral  PainSc:                  Dakhari Zuver C Dalicia Kisner

## 2021-02-27 NOTE — Op Note (Signed)
[facility] Patient Name: Jonathan Keith Procedure Date: 02/27/2021 12:13 PM MRN: [id number] Date of Birth: 09-13-1955 Attending MD: Hildred Laser , MD CSN: [id number] Age: 65 Admit Type: Inpatient Procedure:                ERCP Indications:              Jaundice secondary to cholangiocarcinoma Providers:                Hildred Laser, MD, Janeece Riggers, RN, Lambert Mody, Randa Spike, Technician Referring MD:             Barton Dubois Medicines:                General Anesthesia Complications:            No immediate complications. Estimated Blood Loss:     Estimated blood loss: none. Procedure:                Pre-Anesthesia Assessment:                           - Prior to the procedure, a History and Physical                            was performed, and patient medications and                            allergies were reviewed. The patient's tolerance of                            previous anesthesia was also reviewed. The risks                            and benefits of the procedure and the sedation                            options and risks were discussed with the patient.                            All questions were answered, and informed consent                            was obtained. Prior Anticoagulants: The patient has                            taken no previous anticoagulant or antiplatelet                            agents. ASA Grade Assessment: III - A patient with                            severe systemic disease. After reviewing the risks  and benefits, the patient was deemed in                            satisfactory condition to undergo the procedure.                           After obtaining informed consent, the scope was                            passed under direct vision. Throughout the                            procedure, the patient's blood pressure, pulse, and                             oxygen saturations were monitored continuously. The                            [employer] single use                            duodenoscope was introduced through the mouth, and                            used to inject contrast into and used to inject                            contrast into the bile duct. The ERCP was                            accomplished without difficulty. The patient                            tolerated the procedure well. Scope In: 12:47:09 PM Scope Out: 1:28:59 PM Total Procedure Duration: 0 hours 41 minutes 50 seconds  Findings:      A biliary stent was visible on the scout film. The esophagus was       successfully intubated under direct vision. The scope was advanced to a       normal major papilla in the descending duodenum without detailed       examination of the pharynx, larynx and associated structures, and upper       GI tract. Focal antral gastritis noted with single erosion otherwise       normal limited view of upper GI tract. One plastic stent originating in       the biliary tree was emerging from the major papilla. The major papilla       was congested. One stent was removed from the biliary tree using a       snare. The bile duct was deeply cannulated with the Autotome       sphincterotome. Contrast was injected. I personally interpreted the bile       duct images. There was brisk flow of contrast through the ducts. Image       quality was adequate. Contrast extended to the main bile duct. Contrast  extended to the bifurcation. Contrast extended to the hepatic ducts. 2       strictures were noted. Distal stricture was short and proximal CBD and       second stricture was at hilum. The stricture was estimated to be 20 mm       in length and high-grade.. The right main hepatic duct and right       intrahepatic branches were markedly dilated. Left hepatic system did not       fill with contrast. Site of obstruction was  probed with Hydra Jagwire as       well as revolution wire changing the direction with sphincterotome but       unable to access the left hepatic system.      Hilar stricture was then dilated with HurriCaine dilated to 6 mm. Was       able to advance 10 French 9 cm long plastic stent without any       difficulty. Proximal margin of the stent was well above the proximal       margin of the stricture. As the stent was deployed free flow of contrast       was noted into the duodenum. Impression:               - Biliary stent was removed.                           -Hilar stricture with dilated right hepatic system                            but left system not filled with contrast or                            cannulated with guidewires.                           -Follow stricture was dilated with a balloon to 6                            mm.                           -10 French 9 cm long plastic stent placed across                            the stricture into right hepatic system. Moderate Sedation:      Per Anesthesia Care Recommendation:           - Return patient to hospital ward for ongoing care.                           - Clear liquids today.                           - Continue present medications.                           - Patient will need stenting to decompress left  hepatic system via percutaneous approach.                           - Would recommend patient be transferred to [facility] to be seen by IR and he should also be                            evaluated by oncologist and oncologic surgeon.                           - Findings and recommendations discussed with                            patient's wife and their daughter. Procedure Code(s):        --- Professional ---                           [phone], Endoscopic retrograde                            cholangiopancreatography (ERCP); with removal of                             foreign body(s) or stent(s) from biliary/pancreatic                            duct(s) Diagnosis Code(s):        --- Professional ---                           Z96.89, Presence of other specified functional                            implants                           K83.8, Other specified diseases of biliary tract                           K83.1, Obstruction of bile duct                           Z46.59, Encounter for fitting and adjustment of                            other gastrointestinal appliance and device                           R17, Unspecified jaundice                           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 All rights reserved. The codes documented in this report are preliminary and upon coder review may  be revised to meet current compliance requirements.  Hildred Laser, MD Hildred Laser, MD 02/27/2021 5:02:15 PM This report has been signed electronically. Number of Addenda: 0

## 2021-02-27 NOTE — Progress Notes (Signed)
Pharmacy Antibiotic Note  Jonathan Keith is a 65 y.o. male admitted on 02/24/2021 with  Intra-abdominal .  Pharmacy has been consulted for Zosyn dosing.  Plan: Continue Zosyn 3.375g IV q8h (4 hour infusion). Monitor clinical progress, cultures/sensitivities, renal function, abx plan  Height: 5\' 11"  (180.3 cm) Weight: 85.9 kg (189 lb 6 oz) IBW/kg (Calculated) : 75.3  Temp (24hrs), Avg:98.1 F (36.7 C), Min:98 F (36.7 C), Max:98.2 F (36.8 C)  Recent Labs  Lab 02/24/21 1223 02/24/21 1233 02/25/21 0346 02/26/21 0356 02/27/21 0355  WBC 7.7  --  6.4 6.0  --   CREATININE 0.57* 0.90 0.54* 0.61 0.73     Estimated Creatinine Clearance: 98 mL/min (by C-G formula based on SCr of 0.73 mg/dL).    No Known Allergies  Antimicrobials this admission: 7/18 Rocephin x 1 7/18 Zosyn >>     Thank you for allowing [place] to participate in this patients care.  Margot Ables, PharmD Clinical Pharmacist 02/27/2021 8:52 AM

## 2021-02-27 NOTE — Progress Notes (Signed)
Patient returned from PACU in stable condition . No c/o pain or discomfort. VSS. Able to ambulate to bathroom with supervision to void. Clear Liquid diet ordered. Call bell within reach,. Will continue to monitor\   02/27/21 1458  Vitals  Temp 97.7 F (36.5 C)  Temp Source Oral  BP (!) 155/81  MAP (mmHg) 105  BP Method Automatic  Pulse Rate 71  Pulse Rate Source Monitor  MEWS COLOR  MEWS Score Color Green  Oxygen Therapy  SpO2 99 %  O2 Device Nasal Cannula  O2 Flow Rate (L/min) 3 L/min  MEWS Score  MEWS Temp 0  MEWS Systolic 0  MEWS Pulse 0  MEWS RR 0  MEWS LOC 0  MEWS Score 0

## 2021-02-27 NOTE — Plan of Care (Signed)
  Problem: Education: Goal: Knowledge of General Education information will improve Description: Including pain rating scale, medication(s)/side effects and non-pharmacologic comfort measures Outcome: Progressing   Problem: Health Behavior/Discharge Planning: Goal: Ability to manage health-related needs will improve Outcome: Progressing   Problem: Clinical Measurements: Goal: Ability to maintain clinical measurements within normal limits will improve Outcome: Progressing Goal: Will remain free from infection Outcome: Progressing Goal: Diagnostic test results will improve Outcome: Progressing Goal: Respiratory complications will improve Outcome: Progressing Goal: Cardiovascular complication will be avoided Outcome: Progressing   Problem: Activity: Goal: Risk for activity intolerance will decrease Outcome: Progressing   Problem: Nutrition: Goal: Adequate nutrition will be maintained Outcome: Progressing   Problem: Pain Managment: Goal: General experience of comfort will improve Outcome: Progressing   Problem: Skin Integrity: Goal: Risk for impaired skin integrity will decrease Outcome: Progressing

## 2021-02-27 NOTE — Progress Notes (Signed)
PROGRESS NOTE    Jonathan Keith  JHE:[id number] DOB: 03-11-1956 DOA: 02/24/2021 PCP: [name], PA-C     Brief Narrative:  Jonathan Keith is a 65 y.o. male with medical history significant of claudication presenting with jaundice.  He states that for about a week, patient has continued to feel fatigue and weakness.  Most concerned about the color of his urine, then his coworker started to comment that his skin was turning yellow.  He went to go see his primary care physician, and was instructed to seek care in the emergency department.  Work-up with CT revealed biliary obstruction with abnormal appearing gallbladder.  GI as well as general surgery were consulted.  New events last 24 hours / Subjective: No abdominal pain, no nausea vomiting currently.  Patient is afebrile.  Assessment & Plan:   Principal Problem:   Obstructive jaundice Active Problems:   Diabetes mellitus (HCC)   Dyslipidemia   Hypertension   Tobacco dependence   Elevated LFTs   Choledocholithiasis   Obstructive jaundice with elevated liver function test and hyperbilirubinemia -CT A/P: Intrahepatic and extrahepatic biliary duct dilation, with multiple small high attenuation obstructing foci within the common hepatic and common bile duct. Moderate gallbladder distension, with focal circumferential wall thickening and enhancement within the body of the gallbladder. Differential diagnosis includes focal cholecystitis versus neoplasm. -Status post ERCP by GI; cytology brushes suggesting presence of adenocarcinoma. -S/p repeat ERCP with plan for double stent placement in his hepatic ducts; only right sided able to be place; patient with completely occluded left hepatic duct. -following GI recommendations will transfer to [facility] for IR intervention. -Continue the use of empiric IV Zosyn, for prevention of cholangitis. Patient is afebrile and with normal WBC's. -will need outpatient follow up with oncology service and  dedicated surgery team to further determine treatment options and complete staging.  Diabetes mellitus with hyperglycemia -Hemoglobin A1c 8.7 -Continue holding oral hypoglycemic agents -Continue sliding scale insulin. -Continue to follow CBGs and further adjust hypoglycemic regimen as required.  Hyperlipidemia -Continue holding Pravachol in setting of elevated liver function -Follow LFTs  DVT prophylaxis:  SCDs Start: 02/24/21 1907  Code Status:     Code Status Orders  (From admission, onward)           Start     Ordered   02/24/21 1907  Full code  Continuous        02/24/21 1907           Code Status History     This patient has a current code status but no historical code status.      Advance Directive Documentation    Flowsheet Row Most Recent Value  Type of Advance Directive Healthcare Power of Attorney  Pre-existing out of facility DNR order (yellow form or pink MOST form) --  "MOST" Form in Place? --      Family Communication: Spouse and son at bedside  Disposition Plan:  Status is: Inpatient  Remains inpatient appropriate because:Inpatient level of care appropriate due to severity of illness  Dispo: The patient is from: Home              Anticipated d/c is to: Home              Patient currently is not medically stable to d/c.   Difficult to place patient No   Consultants:  GI IR  Procedures:  ERCP 7/19 and repeated 7/21  Antimicrobials:  Anti-infectives (From admission, onward)  Start     Dose/Rate Route Frequency Ordered Stop   02/24/21 2200  piperacillin-tazobactam (ZOSYN) IVPB 3.375 g  Status:  Discontinued        3.375 g 12.5 mL/hr over 240 Minutes Intravenous Every 8 hours 02/24/21 2003 02/24/21 2004   02/24/21 2100  piperacillin-tazobactam (ZOSYN) IVPB 3.375 g        3.375 g 12.5 mL/hr over 240 Minutes Intravenous Every 8 hours 02/24/21 2004     02/24/21 1745  cefTRIAXone (ROCEPHIN) 1 g in sodium chloride 0.9 % 100 mL IVPB         1 g 200 mL/hr over 30 Minutes Intravenous  Once 02/24/21 1736 02/24/21 1815        Objective: Vitals:   02/27/21 1400 02/27/21 1415 02/27/21 1430 02/27/21 1458  BP: (!) 148/86 (!) 175/91 (!) 171/91 (!) 155/81  Pulse: 74 69 70 71  Resp: 14 12 15    Temp:    97.7 F (36.5 C)  TempSrc:    Oral  SpO2: 95% 100% 99% 99%  Weight:      Height:        Intake/Output Summary (Last 24 hours) at 02/27/2021 1702 Last data filed at 02/27/2021 1340 Gross per 24 hour  Intake 1250 ml  Output 0 ml  Net 1250 ml   Filed Weights   02/24/21 1211 02/24/21 1905  Weight: 104.8 kg 85.9 kg    Examination:  General exam: Alert, awake, oriented x 3; positive jaundice, no nausea, no vomiting.  Expressed overnight having small vomiting event after overeating.  Denies abdominal discomfort at this time. Respiratory system: Clear to auscultation. Respiratory effort normal.  No wheezing or crackles, no using accessory muscle.  Good saturation on room air. Cardiovascular system: No chest pain, RRR. No murmurs, rubs, gallops.  No JVD Gastrointestinal system: Abdomen is nondistended, soft and nontender. No organomegaly or masses felt. Normal bowel sounds heard. Central nervous system: Alert and oriented. No focal neurological deficits. Extremities: No cyanosis clubbing or edema. Skin: No petechiae. Psychiatry: Judgement and insight appear normal. Mood & affect appropriate.    Data Reviewed: I have personally reviewed following labs and imaging studies  CBC: Recent Labs  Lab 02/24/21 1223 02/24/21 1233 02/25/21 0346 02/26/21 0356  WBC 7.7  --  6.4 6.0  NEUTROABS 4.3  --   --   --   HGB 13.3 15.0 12.5* 12.4*  HCT 39.5 44.0 36.5* 36.9*  MCV 93.8  --  95.3 94.9  PLT 183  --  194 397   Basic Metabolic Panel: Recent Labs  Lab 02/24/21 1223 02/24/21 1233 02/25/21 0346 02/26/21 0356 02/27/21 0355  NA 130* 135 129* 130* 129*  K 4.1 4.2 3.9 4.3 3.9  CL 100 105 99 97* 100  CO2 22  --  23 27  22   GLUCOSE 194* 196* 179* 200* 149*  BUN 20 19 17 14 20   CREATININE 0.57* 0.90 0.54* 0.61 0.73  CALCIUM 9.4  --  9.0 9.2 8.4*   GFR: Estimated Creatinine Clearance: 98 mL/min (by C-G formula based on SCr of 0.73 mg/dL).  Liver Function Tests: Recent Labs  Lab 02/24/21 1223 02/25/21 0346 02/26/21 0356 02/27/21 0355  AST 111* 115* 120* 204*  ALT 230* 208* 209* 245*  ALKPHOS 355* 318* 344* 302*  BILITOT 18.0* 17.6* 20.9* 19.3*  PROT 7.1 6.3* 6.5 5.8*  ALBUMIN 3.2* 2.8* 2.8* 2.5*   Recent Labs  Lab 02/24/21 1223  LIPASE 34    HbA1C: No results for input(s):  HGBA1C in the last 72 hours.  CBG: Recent Labs  Lab 02/26/21 2022 02/27/21 0723 02/27/21 1104 02/27/21 1412 02/27/21 1616  GLUCAP 142* 129* 110* 109* 201*    Recent Results (from the past 240 hour(s))  Resp Panel by RT-PCR (Flu A&B, Covid) Nasopharyngeal Swab     Status: None   Collection Time: 02/24/21  5:36 PM   Specimen: Nasopharyngeal Swab; Nasopharyngeal(NP) swabs in vial transport medium  Result Value Ref Range Status   SARS Coronavirus 2 by RT PCR NEGATIVE NEGATIVE Final    Comment: (NOTE) SARS-CoV-2 target nucleic acids are NOT DETECTED.  The SARS-CoV-2 RNA is generally detectable in upper respiratory specimens during the acute phase of infection. The lowest concentration of SARS-CoV-2 viral copies this assay can detect is 138 copies/mL. A negative result does not preclude SARS-Cov-2 infection and should not be used as the sole basis for treatment or other patient management decisions. A negative result may occur with  improper specimen collection/handling, submission of specimen other than nasopharyngeal swab, presence of viral mutation(s) within the areas targeted by this assay, and inadequate number of viral copies(<138 copies/mL). A negative result must be combined with clinical observations, patient history, and epidemiological information. The expected result is Negative.  Fact Sheet for  Patients:  EntrepreneurPulse.com.au  Fact Sheet for Healthcare Providers:  IncredibleEmployment.be  This test is no t yet approved or cleared by the [place] FDA and  has been authorized for detection and/or diagnosis of SARS-CoV-2 by FDA under an Emergency Use Authorization (EUA). This EUA will remain  in effect (meaning this test can be used) for the duration of the COVID-19 declaration under Section 564(b)(1) of the Act, 21 U.S.C.section 360bbb-3(b)(1), unless the authorization is terminated  or revoked sooner.       Influenza A by PCR NEGATIVE NEGATIVE Final   Influenza B by PCR NEGATIVE NEGATIVE Final    Comment: (NOTE) The Xpert Xpress SARS-CoV-2/FLU/RSV plus assay is intended as an aid in the diagnosis of influenza from Nasopharyngeal swab specimens and should not be used as a sole basis for treatment. Nasal washings and aspirates are unacceptable for Xpert Xpress SARS-CoV-2/FLU/RSV testing.  Fact Sheet for Patients: EntrepreneurPulse.com.au  Fact Sheet for Healthcare Providers: IncredibleEmployment.be  This test is not yet approved or cleared by the [place] FDA and has been authorized for detection and/or diagnosis of SARS-CoV-2 by FDA under an Emergency Use Authorization (EUA). This EUA will remain in effect (meaning this test can be used) for the duration of the COVID-19 declaration under Section 564(b)(1) of the Act, 21 U.S.C. section 360bbb-3(b)(1), unless the authorization is terminated or revoked.  Performed at [facility], [address], [place], [place] [address]      Radiology Studies: MR 3D Recon At Scanner  Result Date: 02/25/2021 CLINICAL DATA:  Jaundice, focal gallbladder wall thickening on CT EXAM: MRI ABDOMEN WITHOUT AND WITH CONTRAST (INCLUDING MRCP) TECHNIQUE: Multiplanar multisequence MR imaging of the abdomen was performed both before and after the administration  of intravenous contrast. Heavily T2-weighted images of the biliary and pancreatic ducts were obtained, and three-dimensional MRCP images were rendered by post processing. CONTRAST:  14mL GADAVIST GADOBUTROL 1 MMOL/ML IV SOLN COMPARISON:  CT abdomen/pelvis dated 02/24/2021 FINDINGS: Lower chest: Lung bases are clear. Hepatobiliary: Liver is within normal limits. No suspicious/enhancing hepatic lesions. However, there is associated moderate intrahepatic ductal dilatation with suspected abnormal enhancing soft tissue at the hilar confluence measuring 12 mm (series 23/image 43), raising the concern for a small hilar  cholangiocarcinoma/Klatskin tumor. Gallbladder is notable for a 3.5 cm gallstone (series 4/image 20). There is associated focal wall thickening with narrowing/stenosis (series 4/image 19) in the mid gallbladder, with enhancement (series 18/image 40) in this region and proximally along the gallstone. While neoplasm remains possible, this overall appearance favors reactive inflammation secondary to the large gallstone. Distal CBD is nondilated, measuring 6 mm. No choledocholithiasis is seen. Pancreas:  Within normal limits. Spleen:  Within normal limits. Adrenals/Urinary Tract:  Adrenal glands are within normal limits. Kidneys are within normal limits.  No hydronephrosis. Stomach/Bowel: Stomach is within normal limits. Visualized bowel is unremarkable. Vascular/Lymphatic:  No evidence of abdominal aortic aneurysm. No suspicious abdominal lymphadenopathy. Other:  No abdominal ascites. Musculoskeletal: No focal osseous lesions. IMPRESSION: Suspected 12 mm enhancing soft tissue lesion at the hilar confluence, raise concern for hilar cholangiocarcinoma/Klatskin tumor. Associated moderate intrahepatic ductal dilatation. 3.5 cm gallstone. Adjacent wall thickening/narrowing of the mid gallbladder, with surrounding enhancement, favored to reflect reactive inflammation. Gallbladder neoplasm remains possible but is  considered less likely. Distal CBD is nondilated, measuring 6 mm. No choledocholithiasis is seen. Electronically Signed   By: Julian Hy M.D.   On: 02/25/2021 20:09   DG ERCP  Result Date: 02/27/2021 CLINICAL DATA:  History of cholangio carcinoma with obstructive jaundice, post ERCP. EXAM: ERCP TECHNIQUE: Multiple spot images obtained with the fluoroscopic device and submitted for interpretation post-procedure. COMPARISON:  ERCP-02/25/2021 CT abdomen pelvis-02/24/2021 MRCP-02/25/2021 FLUOROSCOPY TIME:  6 minutes, 9 seconds (141.5 mGy) FINDINGS: Multiple spot fluoroscopic images of the right upper abdominal quadrant during ERCP are provided for review. Initial image demonstrates an ERCP probe overlying the right upper abdominal quadrant. A biliary stent overlies expected location of the CBD. Subsequent images demonstrate removal of the biliary stent with subsequent cannulation and opacification of the CBD which demonstrates tapered irregularity at its central aspect. There is moderate to marked dilatation of the opacified central aspect of the intrahepatic biliary tree. Subsequent images demonstrate apparent biliary plasty at the level the hilum. Completion images demonstrate placement of a new internal biliary stent. IMPRESSION: ERCP with biliary plasty and stent exchange above. These images were submitted for radiologic interpretation only. Please see the procedural report for the amount of contrast and the fluoroscopy time utilized. Electronically Signed   By: Sandi Mariscal M.D.   On: 02/27/2021 14:08   MR ABDOMEN MRCP W WO CONTAST  Result Date: 02/25/2021 CLINICAL DATA:  Jaundice, focal gallbladder wall thickening on CT EXAM: MRI ABDOMEN WITHOUT AND WITH CONTRAST (INCLUDING MRCP) TECHNIQUE: Multiplanar multisequence MR imaging of the abdomen was performed both before and after the administration of intravenous contrast. Heavily T2-weighted images of the biliary and pancreatic ducts were obtained, and  three-dimensional MRCP images were rendered by post processing. CONTRAST:  18mL GADAVIST GADOBUTROL 1 MMOL/ML IV SOLN COMPARISON:  CT abdomen/pelvis dated 02/24/2021 FINDINGS: Lower chest: Lung bases are clear. Hepatobiliary: Liver is within normal limits. No suspicious/enhancing hepatic lesions. However, there is associated moderate intrahepatic ductal dilatation with suspected abnormal enhancing soft tissue at the hilar confluence measuring 12 mm (series 23/image 43), raising the concern for a small hilar cholangiocarcinoma/Klatskin tumor. Gallbladder is notable for a 3.5 cm gallstone (series 4/image 20). There is associated focal wall thickening with narrowing/stenosis (series 4/image 19) in the mid gallbladder, with enhancement (series 18/image 40) in this region and proximally along the gallstone. While neoplasm remains possible, this overall appearance favors reactive inflammation secondary to the large gallstone. Distal CBD is nondilated, measuring 6 mm. No choledocholithiasis is seen.  Pancreas:  Within normal limits. Spleen:  Within normal limits. Adrenals/Urinary Tract:  Adrenal glands are within normal limits. Kidneys are within normal limits.  No hydronephrosis. Stomach/Bowel: Stomach is within normal limits. Visualized bowel is unremarkable. Vascular/Lymphatic:  No evidence of abdominal aortic aneurysm. No suspicious abdominal lymphadenopathy. Other:  No abdominal ascites. Musculoskeletal: No focal osseous lesions. IMPRESSION: Suspected 12 mm enhancing soft tissue lesion at the hilar confluence, raise concern for hilar cholangiocarcinoma/Klatskin tumor. Associated moderate intrahepatic ductal dilatation. 3.5 cm gallstone. Adjacent wall thickening/narrowing of the mid gallbladder, with surrounding enhancement, favored to reflect reactive inflammation. Gallbladder neoplasm remains possible but is considered less likely. Distal CBD is nondilated, measuring 6 mm. No choledocholithiasis is seen. Electronically  Signed   By: Julian Hy M.D.   On: 02/25/2021 20:09      Scheduled Meds:  glucagon (human recombinant)       insulin aspart  0-15 Units Subcutaneous TID WC   insulin aspart  0-5 Units Subcutaneous QHS   simethicone       Continuous Infusions:  lactated ringers 100 mL/hr at 02/27/21 0523   piperacillin-tazobactam (ZOSYN)  IV 3.375 g (02/27/21 0449)   sodium chloride       LOS: 3 days    Time spent: 30 minutes   Barton Dubois, MD [facility] 02/27/2021, 5:02 PM   Available via Epic secure chat 7am-7pm After these hours, please refer to coverage provider listed on [email or web address]

## 2021-02-27 NOTE — Anesthesia Preprocedure Evaluation (Signed)
Anesthesia Evaluation  Patient identified by MRN, date of birth, ID band Patient awake    Reviewed: Allergy & Precautions, NPO status , Patient's Chart, lab work & pertinent test results  History of Anesthesia Complications Negative for: history of anesthetic complications  Airway Mallampati: II  TM Distance: >3 FB Neck ROM: Full   Comment: Neck pain  Dental  (+) Dental Advisory Given, Missing   Pulmonary Current Smoker and Patient abstained from smoking.,    Pulmonary exam normal breath sounds clear to auscultation       Cardiovascular Exercise Tolerance: Good hypertension, Pt. on medications + Peripheral Vascular Disease  Normal cardiovascular exam Rhythm:Regular Rate:Normal     Neuro/Psych negative neurological ROS  negative psych ROS   GI/Hepatic negative GI ROS, (+) Cirrhosis  (obstructive jaundice)    substance abuse  alcohol use,   Endo/Other  diabetes, Well Controlled, Type 2, Oral Hypoglycemic Agents  Renal/GU negative Renal ROS     Musculoskeletal  (+) Arthritis  (chronic pain),   Abdominal   Peds  Hematology negative hematology ROS (+)   Anesthesia Other Findings Hyponatremia   Reproductive/Obstetrics negative OB ROS                             Anesthesia Physical  Anesthesia Plan  ASA: 4  Anesthesia Plan: General   Post-op Pain Management:    Induction: Intravenous  PONV Risk Score and Plan: 3 and Ondansetron  Airway Management Planned: Oral ETT  Additional Equipment:   Intra-op Plan:   Post-operative Plan: Extubation in OR  Informed Consent: I have reviewed the patients History and Physical, chart, labs and discussed the procedure including the risks, benefits and alternatives for the proposed anesthesia with the patient or authorized representative who has indicated his/her understanding and acceptance.     Dental advisory given  Plan Discussed  with: CRNA and Surgeon  Anesthesia Plan Comments:         Anesthesia Quick Evaluation

## 2021-02-27 NOTE — Progress Notes (Signed)
Subjective:  Patient has no complaints.  He denies chest pain shortness of breath or abdominal pain.  His urine remains dark.  Current Medications:  Current Facility-Administered Medications:    0.9 %  sodium chloride infusion, , Intravenous, Continuous, Kire Ferg, Mechele Dawley, MD   [MAR Hold] acetaminophen (TYLENOL) tablet 650 mg, 650 mg, Oral, Q6H PRN, 650 mg at 02/24/21 2135 **OR** [MAR Hold] acetaminophen (TYLENOL) suppository 650 mg, 650 mg, Rectal, Q6H PRN, Merlin Ege U, MD   chlorhexidine (PERIDEX) 0.12 % solution 15 mL, 15 mL, Mouth/Throat, Once **OR** MEDLINE mouth rinse, 15 mL, Mouth Rinse, Once, Battula, Rajamani C, MD   glucagon (human recombinant) (GLUCAGEN) 1 MG injection, , , ,    [MAR Hold] hydrALAZINE (APRESOLINE) injection 10 mg, 10 mg, Intravenous, Q2H PRN, Verna Desrocher, Mechele Dawley, MD   [MAR Hold] insulin aspart (novoLOG) injection 0-15 Units, 0-15 Units, Subcutaneous, TID WC, Shaquala Broeker U, MD, 2 Units at 02/27/21 0812   [facility] Hold] insulin aspart (novoLOG) injection 0-5 Units, 0-5 Units, Subcutaneous, QHS, Kristy Schomburg U, MD, 3 Units at 02/25/21 2139   lactated ringers infusion, , Intravenous, Continuous, Barton Dubois, MD, Last Rate: 100 mL/hr at 02/27/21 0523, New Bag at 02/27/21 0523   lactated ringers infusion, , Intravenous, Continuous, Battula, Rajamani C, MD, Last Rate: 50 mL/hr at 02/27/21 1207, New Bag at 02/27/21 1207   [facility] Hold] morphine 2 MG/ML injection 2 mg, 2 mg, Intravenous, Q2H PRN, Maryori Weide, Mechele Dawley, MD   [MAR Hold] ondansetron (ZOFRAN-ODT) disintegrating tablet 4 mg, 4 mg, Oral, Q6H PRN **OR** [MAR Hold] ondansetron (ZOFRAN) injection 4 mg, 4 mg, Intravenous, Q6H PRN, Sirus Labrie, Mechele Dawley, MD   [MAR Hold] oxyCODONE (Oxy IR/ROXICODONE) immediate release tablet 5-10 mg, 5-10 mg, Oral, Q4H PRN, Izayah Miner U, MD   [MAR Hold] piperacillin-tazobactam (ZOSYN) IVPB 3.375 g, 3.375 g, Intravenous, Q8H, Brandye Inthavong U, MD, Last Rate: 12.5 mL/hr at 02/27/21 0449, 3.375  g at 02/27/21 0449   sodium chloride 0.9 % infusion, , , ,    Objective: Blood pressure (!) 155/76, pulse 72, temperature 97.6 F (36.4 C), temperature source Oral, resp. rate 18, height 5' 11"  (1.803 m), weight 85.9 kg, SpO2 96 %. Patient is alert and in no acute distress. Skin color is yellow. Conjunctiva is pink. Sclera is icteric Oropharyngeal mucosa is normal. No neck masses or thyromegaly noted. Cardiac exam with regular rhythm normal S1 and S2. No murmur or gallop noted. Lungs are clear to auscultation. Abdomen.  He has small umbilical hernia which is soft and partially reducible.  Abdomen is soft and nontender with organomegaly or masses. No LE edema or clubbing noted.  Labs/studies Results:   CBC Latest Ref Rng & Units 02/26/2021 02/25/2021 02/24/2021  WBC 4.0 - 10.5 K/uL 6.0 6.4 -  Hemoglobin 13.0 - 17.0 g/dL 12.4(L) 12.5(L) 15.0  Hematocrit 39.0 - 52.0 % 36.9(L) 36.5(L) 44.0  Platelets 150 - 400 K/uL 227 194 -    CMP Latest Ref Rng & Units 02/27/2021 02/26/2021 02/25/2021  Glucose 70 - 99 mg/dL 149(H) 200(H) 179(H)  BUN 8 - 23 mg/dL 20 14 17   Creatinine 0.61 - 1.24 mg/dL 0.73 0.61 0.54(L)  Sodium 135 - 145 mmol/L 129(L) 130(L) 129(L)  Potassium 3.5 - 5.1 mmol/L 3.9 4.3 3.9  Chloride 98 - 111 mmol/L 100 97(L) 99  CO2 22 - 32 mmol/L 22 27 23   Calcium 8.9 - 10.3 mg/dL 8.4(L) 9.2 9.0  Total Protein 6.5 - 8.1 g/dL 5.8(L) 6.5 6.3(L)  Total Bilirubin 0.3 -  1.2 mg/dL 19.3(HH) 20.9(HH) 17.6(H)  Alkaline Phos 38 - 126 U/L 302(H) 344(H) 318(H)  AST 15 - 41 U/L 204(H) 120(H) 115(H)  ALT 0 - 44 U/L 245(H) 209(H) 208(H)    Hepatic Function Latest Ref Rng & Units 02/27/2021 02/26/2021 02/25/2021  Total Protein 6.5 - 8.1 g/dL 5.8(L) 6.5 6.3(L)  Albumin 3.5 - 5.0 g/dL 2.5(L) 2.8(L) 2.8(L)  AST 15 - 41 U/L 204(H) 120(H) 115(H)  ALT 0 - 44 U/L 245(H) 209(H) 208(H)  Alk Phosphatase 38 - 126 U/L 302(H) 344(H) 318(H)  Total Bilirubin 0.3 - 1.2 mg/dL 19.3(HH) 20.9(HH) 17.6(H)    CEA and  CA 19-9 are pending  Assessment:  #1.  Obstructive jaundice secondary to malignant biliary obstruction.  He underwent ERCP 2 days ago with placement of 8.5 [place] plastic biliary stent with it is not working.  In the meantime cytology has come back positive for adenocarcinoma.  Based on MRCP he has bismuth type II obstruction.  He will need stenting to left as well as right system.  Since patient has not been evaluated by oncology as well as surgery I would plan to put a plastic stents and hold off using metal stents. Plan discussed with patient and he is agreeable. Patient remains on IV Zosyn.  #2.  Diabetes mellitus.  Plan:  ERCP with removal of biliary stent and goal is to place to plastic stents 1 on each side.  He may require stricture dilation.

## 2021-02-27 NOTE — Transfer of Care (Signed)
Immediate Anesthesia Transfer of Care Note  Patient: Jonathan Keith  Procedure(s) Performed: ENDOSCOPIC RETROGRADE CHOLANGIOPANCREATOGRAPHY (ERCP) BILIARY STENT PLACEMENT (10FR x 9cm) IN THE RIGHT SYSTEM STENT REMOVAL (8.5Fr x 9cm) Balloon dilation wire-guided  Patient Location: PACU  Anesthesia Type:General  Level of Consciousness: awake, alert  and oriented  Airway & Oxygen Therapy: Patient Spontanous Breathing and Patient connected to nasal cannula oxygen  Post-op Assessment: Report given to RN and Post -op Vital signs reviewed and stable  Post vital signs: Reviewed and stable  Last Vitals:  Vitals Value Taken Time  BP 131/94   Temp    Pulse 86   Resp    SpO2 98%     Last Pain:  Vitals:   02/27/21 1144  TempSrc: Oral  PainSc: 0-No pain      Patients Stated Pain Goal: 5 (69/50/72 2575)  Complications: No notable events documented.

## 2021-02-27 NOTE — Anesthesia Procedure Notes (Signed)
Procedure Name: Intubation Date/Time: 02/27/2021 12:35 PM Performed by: Karna Dupes, CRNA Pre-anesthesia Checklist: Patient identified, Emergency Drugs available, Suction available and Patient being monitored Patient Re-evaluated:Patient Re-evaluated prior to induction Oxygen Delivery Method: Circle system utilized Preoxygenation: Pre-oxygenation with 100% oxygen Induction Type: IV induction Ventilation: Mask ventilation without difficulty Laryngoscope Size: Mac and 4 Grade View: Grade II Tube type: Oral Tube size: 7.5 mm Number of attempts: 1 Airway Equipment and Method: Stylet Placement Confirmation: ETT inserted through vocal cords under direct vision, positive ETCO2 and breath sounds checked- equal and bilateral Secured at: 22 cm Tube secured with: Tape Dental Injury: Teeth and Oropharynx as per pre-operative assessment

## 2021-02-28 ENCOUNTER — Ambulatory Visit (HOSPITAL_COMMUNITY): Payer: Commercial Managed Care - PPO | Attending: Internal Medicine

## 2021-02-28 ENCOUNTER — Encounter (HOSPITAL_COMMUNITY): Payer: Self-pay | Admitting: Internal Medicine

## 2021-02-28 DIAGNOSIS — F17200 Nicotine dependence, unspecified, uncomplicated: Secondary | ICD-10-CM

## 2021-02-28 DIAGNOSIS — K831 Obstruction of bile duct: Secondary | ICD-10-CM | POA: Diagnosis not present

## 2021-02-28 DIAGNOSIS — E1165 Type 2 diabetes mellitus with hyperglycemia: Secondary | ICD-10-CM | POA: Diagnosis not present

## 2021-02-28 DIAGNOSIS — I10 Essential (primary) hypertension: Secondary | ICD-10-CM

## 2021-02-28 DIAGNOSIS — C221 Intrahepatic bile duct carcinoma: Secondary | ICD-10-CM | POA: Diagnosis not present

## 2021-02-28 DIAGNOSIS — E785 Hyperlipidemia, unspecified: Secondary | ICD-10-CM | POA: Diagnosis not present

## 2021-02-28 LAB — HEPATIC FUNCTION PANEL
ALT: 226 U/L — ABNORMAL HIGH (ref 0–44)
AST: 117 U/L — ABNORMAL HIGH (ref 15–41)
Albumin: 2.6 g/dL — ABNORMAL LOW (ref 3.5–5.0)
Alkaline Phosphatase: 295 U/L — ABNORMAL HIGH (ref 38–126)
Bilirubin, Direct: 6.7 mg/dL — ABNORMAL HIGH (ref 0.0–0.2)
Indirect Bilirubin: 5.7 mg/dL — ABNORMAL HIGH (ref 0.3–0.9)
Total Bilirubin: 12.4 mg/dL — ABNORMAL HIGH (ref 0.3–1.2)
Total Protein: 6 g/dL — ABNORMAL LOW (ref 6.5–8.1)

## 2021-02-28 LAB — CEA: CEA: 4.9 ng/mL — ABNORMAL HIGH (ref 0.0–4.7)

## 2021-02-28 LAB — CANCER ANTIGEN 19-9: CA 19-9: 429 U/mL — ABNORMAL HIGH (ref 0–35)

## 2021-02-28 LAB — GLUCOSE, CAPILLARY
Glucose-Capillary: 179 mg/dL — ABNORMAL HIGH (ref 70–99)
Glucose-Capillary: 226 mg/dL — ABNORMAL HIGH (ref 70–99)
Glucose-Capillary: 165 mg/dL — ABNORMAL HIGH (ref 70–99)

## 2021-02-28 MED ORDER — PANTOPRAZOLE SODIUM 40 MG PO TBEC: 40.0000 mg | DELAYED_RELEASE_TABLET | Freq: Every day | ORAL | 1 refills | Status: DC

## 2021-02-28 MED ORDER — ONDANSETRON 8 MG PO TBDP: 8.0000 mg | ORAL_TABLET | Freq: Three times a day (TID) | ORAL | 0 refills | Status: DC | PRN

## 2021-02-28 MED ORDER — OXYCODONE HCL 5 MG PO TABS: 5.0000 mg | ORAL_TABLET | ORAL | 0 refills | Status: DC | PRN

## 2021-02-28 MED ORDER — AMOXICILLIN-POT CLAVULANATE 875-125 MG PO TABS: 1.0000 | ORAL_TABLET | Freq: Two times a day (BID) | ORAL | 0 refills | Status: DC

## 2021-02-28 MED ORDER — AMOXICILLIN-POT CLAVULANATE 875-125 MG PO TABS
1.0000 | ORAL_TABLET | Freq: Two times a day (BID) | ORAL | Status: DC
  Administered 2021-02-28: 1 via ORAL
  Filled 2021-02-28 (×3): qty 1

## 2021-02-28 NOTE — Sedation Documentation (Signed)
See progress note re procedure cancelled.

## 2021-02-28 NOTE — Sedation Documentation (Signed)
Patient transferred back to [facility] with [facility]

## 2021-02-28 NOTE — Progress Notes (Signed)
Patient procedure cancelled as his Dr wants to do liver transplant evaluation before this procedure is done. Patient was spoken to by A. [place] PA re this and also Dr Anselm Pancoast. A Han PA spoke to his wife who will meet her husband back at [facility]. Forestine Na nurse aware of this, P Dhers RN spoke to her. [facility] is on their way to take him back to [facility]. Patient is upset re this.

## 2021-02-28 NOTE — Sedation Documentation (Signed)
Patient denies pain and is resting comfortably.  

## 2021-02-28 NOTE — Progress Notes (Signed)
Jonathan Keith was brought to [facility] from [facility] via [facility] this morning for image guided biliary drain placement.   Contacted by Miss Neil Crouch, the procedure to be on hold as patient will be evaluated for possible liver transplant at [facility].   Informed Jonathan Keith, both verbalized understanding of the situation.  Patient to be transferred back to [facility].    Armando Gang Aiman Noe PA-C 02/28/2021 9:41 AM

## 2021-02-28 NOTE — Discharge Summary (Signed)
Physician Discharge Summary  Jonathan Keith [id number] DOB: 10-22-1955 DOA: 02/24/2021  PCP: [name], PA-C  Admit date: 02/24/2021 Discharge date: 02/28/2021  Time spent: 35 minutes  Recommendations for Outpatient Follow-up:  Repeat complete metabolic panel to follow electrolytes, renal function and LFTs. Reassess patient's CBGs/A1c and further adjust hypoglycemic regimen as required. Check patient cooperation and determine the need of initiation of antihypertensive regimen. Goals of care discussion and advance directive planning recommended.   Discharge Diagnoses:  Principal Problem:   Obstructive jaundice Active Problems:   Diabetes mellitus ([facility])   Dyslipidemia   Hypertension   Tobacco dependence   Elevated LFTs   Choledocholithiasis   Cholangiocarcinoma [facility])   Discharge Condition: Stable and improved; discharged home with instruction to follow-up with PCP and gastroenterology service.  CODE STATUS: Full code.  Diet recommendation: Heart healthy/modified carbohydrate diet.  Filed Weights   02/24/21 1211 02/24/21 1905  Weight: 104.8 kg 85.9 kg    History of present illness:  Jonathan Keith is a 65 y.o. male with medical history significant of claudication presenting with jaundice.  He states that for about a week, patient has continued to feel fatigue and weakness.  Most concerned about the color of his urine, then his coworker started to comment that his skin was turning yellow.  He went to go see his primary care physician, and was instructed to seek care in the emergency department.  Work-up with CT revealed biliary obstruction with abnormal appearing gallbladder.  GI as well as general surgery were consulted.  Hospital Course:  Obstructive jaundice with elevated liver function test and hyperbilirubinemia -CT A/P: Intrahepatic and extrahepatic biliary duct dilation, with multiple small high attenuation obstructing foci within the common hepatic and common  bile duct. Moderate gallbladder distension, with focal circumferential wall thickening and enhancement within the body of the gallbladder.  -Status post ERCP by GI; cytology brushes suggesting presence of adenocarcinoma/cholangiocarcinoma. -S/p repeat ERCP with plan for double stent placement in his hepatic ducts; only right sided able to be place; patient with completely occluded left hepatic duct. -Patient is less jaundice and his bilirubin trending down appropriately (around 12 at time of discharge). -Outpatient follow-up with gastroenterology service after discharge expected/anticipated. -Patient will complete treatment with oral antibiotic for empiric coverage of cholangitis.  5 days of Augmentin recommended. -will need outpatient follow up with oncology service and dedicated surgery team to further determine treatment options and complete staging.  After discussing with GI service today they will assist securing appointment at the [facility] for a special treatment/available intervention for his condition.   Diabetes mellitus with hyperglycemia -Hemoglobin A1c 8.7 -Resume home oral hypoglycemic regimen. -Continue to follow CBGs with further adjustment to diabetes medications as needed.   Hyperlipidemia -Continue holding Pravachol in setting of elevated liver function -Okay to resume fish oil. -Follow LFTs at follow-up visit.  Tobacco abuse -Cessation counseling provided.  Gastroesophageal flux disease -Patient has been started on PPI.  Procedures: ERCP 7/19 and repeated 7/21   Consultations: Gastroenterology service General surgery  Discharge Exam: Vitals:   02/28/21 1050 02/28/21 1455  BP: (!) 155/84 132/71  Pulse: (!) 58 60  Resp: 19 19  Temp: 98.1 F (36.7 C) 98.5 F (36.9 C)  SpO2: 100% 96%   General exam: Alert, awake, oriented x 3; positive but improved jaundice appreciated; no nausea, no vomiting.  Tolerating diet and reporting no abdominal pain  currently. Respiratory system: Clear to auscultation. Respiratory effort normal.  No wheezing or crackles, no  using accessory muscle.  Good saturation on room air. Cardiovascular system: No chest pain, RRR. No murmurs, rubs, gallops.  No JVD Gastrointestinal system: Abdomen is nondistended, soft and nontender. No organomegaly or masses felt. Normal bowel sounds heard. Central nervous system: Alert and oriented. No focal neurological deficits. Extremities: No cyanosis clubbing or edema. Skin: No petechiae. Psychiatry: Judgement and insight appear normal. Mood & affect appropriate.   Discharge Instructions   Discharge Instructions     Diet - low sodium heart healthy   Complete by: As directed    Discharge instructions   Complete by: As directed    Take medications as prescribed Maintain adequate hydration as discussed Follow heart healthy/modified carbohydrate diet (easy to digest) Follow-up with gastroenterology service      Allergies as of 02/28/2021   No Known Allergies      Medication List     STOP taking these medications    pravastatin 20 MG tablet Commonly known as: PRAVACHOL   Tussin Cough 15 MG/5ML syrup Generic drug: dextromethorphan       TAKE these medications    amoxicillin-clavulanate 875-125 MG tablet Commonly known as: AUGMENTIN Take 1 tablet by mouth every 12 (twelve) hours.   Fish Oil 1000 MG Cpdr Take 2 g by mouth in the morning and at bedtime.   metFORMIN 500 MG 24 hr tablet Commonly known as: GLUCOPHAGE-XR Take 500 mg by mouth daily.   ondansetron 8 MG disintegrating tablet Commonly known as: Zofran ODT Take 1 tablet (8 mg total) by mouth every 8 (eight) hours as needed for nausea or vomiting.   oxyCODONE 5 MG immediate release tablet Commonly known as: Oxy IR/ROXICODONE Take 1 tablet (5 mg total) by mouth every 4 (four) hours as needed for severe pain.   pantoprazole 40 MG tablet Commonly known as: Protonix Take 1 tablet (40 mg  total) by mouth daily.   SUPER CALCIUM 600 + D 400 PO Take by mouth.       No Known Allergies  Follow-up Information     Rehman, Mechele Dawley, MD. Schedule an appointment as soon as possible for a visit in 1 week(s).   Specialty: Gastroenterology Contact information: [address], [address] [place] [address] [phone]         [name], PA-C. Schedule an appointment as soon as possible for a visit in 2 week(s).   Specialties: Physician Assistant, Internal Medicine Contact information: [place] [address] [phone]                 The results of significant diagnostics from this hospitalization (including imaging, microbiology, ancillary and laboratory) are listed below for reference.    Significant Diagnostic Studies: CT ABDOMEN PELVIS W CONTRAST  Result Date: 02/24/2021 CLINICAL DATA:  Abdominal distension, weakness, jaundice EXAM: CT ABDOMEN AND PELVIS WITH CONTRAST TECHNIQUE: Multidetector CT imaging of the abdomen and pelvis was performed using the standard protocol following bolus administration of intravenous contrast. CONTRAST:  185m OMNIPAQUE IOHEXOL 300 MG/ML  SOLN COMPARISON:  None. FINDINGS: Lower chest: No acute pleural or parenchymal lung disease. Minimal bilateral lower lobe bronchiectasis. Hepatobiliary: There is marked intrahepatic and extrahepatic biliary duct dilation, with common bile duct measuring up to 10 mm in size. There are multiple rounded areas of high attenuation within the common hepatic duct and common bile duct, reference images 22, 23, 26. Largest area measures 6 mm. The gallbladder is moderately distended. Circumferential wall thickening and enhancement is seen within the  mid aspect of the gallbladder. This irregular area of gallbladder wall thickening measures up to 7 mm. Differential includes localized inflammatory change versus neoplasm. Pancreas: The pancreas enhances normally. Mild fatty  atrophy of the pancreatic body and tail. No surrounding inflammatory changes. Spleen: Normal in size without focal abnormality. Adrenals/Urinary Tract: Adrenal glands are unremarkable. Kidneys are normal, without renal calculi, focal lesion, or hydronephrosis. Bladder is unremarkable. Stomach/Bowel: No bowel obstruction or ileus. Normal appendix right lower quadrant. Scattered diverticulosis throughout the colon. There is nonspecific wall thickening of the mid sigmoid colon without surrounding inflammation. This may reflect wall thickening due to under distension versus scarring from previous bouts of diverticulitis. Vascular/Lymphatic: Aortic atherosclerosis. No enlarged abdominal or pelvic lymph nodes. Reproductive: Prostate is unremarkable. Other: No free fluid or free gas. Small fat containing umbilical hernia. No bowel herniation. Musculoskeletal: No acute or destructive bony lesions. Reconstructed images demonstrate no additional findings. IMPRESSION: 1. Intrahepatic and extrahepatic biliary duct dilation, with multiple small high attenuation obstructing foci within the common hepatic and common bile duct as above. The appearance would favor multiple obstructing common bile duct calculi. ERCP may be useful for further evaluation and decompression of the biliary system. 2. Moderate gallbladder distension, with focal circumferential wall thickening and enhancement within the body of the gallbladder. Differential diagnosis includes focal cholecystitis versus neoplasm. If further imaging is desired, dedicated liver MRI with and without contrast could be considered. 3. Diverticulosis of the sigmoid colon without evidence of acute diverticulitis. Nonspecific sigmoid colon wall thickening most consistent with scarring from previous bouts of diverticulitis. 4.  Aortic Atherosclerosis (ICD10-I70.0). Electronically Signed   By: Randa Ngo M.D.   On: 02/24/2021 15:17   MR 3D Recon At Scanner  Result Date:  02/25/2021 CLINICAL DATA:  Jaundice, focal gallbladder wall thickening on CT EXAM: MRI ABDOMEN WITHOUT AND WITH CONTRAST (INCLUDING MRCP) TECHNIQUE: Multiplanar multisequence MR imaging of the abdomen was performed both before and after the administration of intravenous contrast. Heavily T2-weighted images of the biliary and pancreatic ducts were obtained, and three-dimensional MRCP images were rendered by post processing. CONTRAST:  54m GADAVIST GADOBUTROL 1 MMOL/ML IV SOLN COMPARISON:  CT abdomen/pelvis dated 02/24/2021 FINDINGS: Lower chest: Lung bases are clear. Hepatobiliary: Liver is within normal limits. No suspicious/enhancing hepatic lesions. However, there is associated moderate intrahepatic ductal dilatation with suspected abnormal enhancing soft tissue at the hilar confluence measuring 12 mm (series 23/image 43), raising the concern for a small hilar cholangiocarcinoma/Klatskin tumor. Gallbladder is notable for a 3.5 cm gallstone (series 4/image 20). There is associated focal wall thickening with narrowing/stenosis (series 4/image 19) in the mid gallbladder, with enhancement (series 18/image 40) in this region and proximally along the gallstone. While neoplasm remains possible, this overall appearance favors reactive inflammation secondary to the large gallstone. Distal CBD is nondilated, measuring 6 mm. No choledocholithiasis is seen. Pancreas:  Within normal limits. Spleen:  Within normal limits. Adrenals/Urinary Tract:  Adrenal glands are within normal limits. Kidneys are within normal limits.  No hydronephrosis. Stomach/Bowel: Stomach is within normal limits. Visualized bowel is unremarkable. Vascular/Lymphatic:  No evidence of abdominal aortic aneurysm. No suspicious abdominal lymphadenopathy. Other:  No abdominal ascites. Musculoskeletal: No focal osseous lesions. IMPRESSION: Suspected 12 mm enhancing soft tissue lesion at the hilar confluence, raise concern for hilar cholangiocarcinoma/Klatskin  tumor. Associated moderate intrahepatic ductal dilatation. 3.5 cm gallstone. Adjacent wall thickening/narrowing of the mid gallbladder, with surrounding enhancement, favored to reflect reactive inflammation. Gallbladder neoplasm remains possible but is considered less likely. Distal CBD is  nondilated, measuring 6 mm. No choledocholithiasis is seen. Electronically Signed   By: Julian Hy M.D.   On: 02/25/2021 20:09   DG ERCP  Result Date: 02/27/2021 CLINICAL DATA:  History of cholangio carcinoma with obstructive jaundice, post ERCP. EXAM: ERCP TECHNIQUE: Multiple spot images obtained with the fluoroscopic device and submitted for interpretation post-procedure. COMPARISON:  ERCP-02/25/2021 CT abdomen pelvis-02/24/2021 MRCP-02/25/2021 FLUOROSCOPY TIME:  6 minutes, 9 seconds (141.5 mGy) FINDINGS: Multiple spot fluoroscopic images of the right upper abdominal quadrant during ERCP are provided for review. Initial image demonstrates an ERCP probe overlying the right upper abdominal quadrant. A biliary stent overlies expected location of the CBD. Subsequent images demonstrate removal of the biliary stent with subsequent cannulation and opacification of the CBD which demonstrates tapered irregularity at its central aspect. There is moderate to marked dilatation of the opacified central aspect of the intrahepatic biliary tree. Subsequent images demonstrate apparent biliary plasty at the level the hilum. Completion images demonstrate placement of a new internal biliary stent. IMPRESSION: ERCP with biliary plasty and stent exchange above. These images were submitted for radiologic interpretation only. Please see the procedural report for the amount of contrast and the fluoroscopy time utilized. Electronically Signed   By: Sandi Mariscal M.D.   On: 02/27/2021 14:08   DG ERCP  Result Date: 02/25/2021 CLINICAL DATA:  Jaundice EXAM: ERCP TECHNIQUE: Multiple spot images obtained with the fluoroscopic device and submitted  for interpretation post-procedure. COMPARISON:  CT 02/24/2021 FINDINGS: Series of fluoroscopic spot images document endoscopic cannulation and opacification of the pancreatic duct and subsequently the biliary tree. There is segmental narrowing of proximal and mid CBD on limited images. Subsequent images document placement of plastic biliary stent. The intrahepatic biliary tree is incompletely opacified, appearing dilated centrally. IMPRESSION: 1. Segmental CBD narrowing, with endoscopic stent placement. These images were submitted for radiologic interpretation only. Please see the procedural report for the amount of contrast and the fluoroscopy time utilized. Electronically Signed   By: Lucrezia Europe M.D.   On: 02/25/2021 15:02   MR ABDOMEN MRCP W WO CONTAST  Result Date: 02/25/2021 CLINICAL DATA:  Jaundice, focal gallbladder wall thickening on CT EXAM: MRI ABDOMEN WITHOUT AND WITH CONTRAST (INCLUDING MRCP) TECHNIQUE: Multiplanar multisequence MR imaging of the abdomen was performed both before and after the administration of intravenous contrast. Heavily T2-weighted images of the biliary and pancreatic ducts were obtained, and three-dimensional MRCP images were rendered by post processing. CONTRAST:  44m GADAVIST GADOBUTROL 1 MMOL/ML IV SOLN COMPARISON:  CT abdomen/pelvis dated 02/24/2021 FINDINGS: Lower chest: Lung bases are clear. Hepatobiliary: Liver is within normal limits. No suspicious/enhancing hepatic lesions. However, there is associated moderate intrahepatic ductal dilatation with suspected abnormal enhancing soft tissue at the hilar confluence measuring 12 mm (series 23/image 43), raising the concern for a small hilar cholangiocarcinoma/Klatskin tumor. Gallbladder is notable for a 3.5 cm gallstone (series 4/image 20). There is associated focal wall thickening with narrowing/stenosis (series 4/image 19) in the mid gallbladder, with enhancement (series 18/image 40) in this region and proximally along the  gallstone. While neoplasm remains possible, this overall appearance favors reactive inflammation secondary to the large gallstone. Distal CBD is nondilated, measuring 6 mm. No choledocholithiasis is seen. Pancreas:  Within normal limits. Spleen:  Within normal limits. Adrenals/Urinary Tract:  Adrenal glands are within normal limits. Kidneys are within normal limits.  No hydronephrosis. Stomach/Bowel: Stomach is within normal limits. Visualized bowel is unremarkable. Vascular/Lymphatic:  No evidence of abdominal aortic aneurysm. No suspicious abdominal lymphadenopathy. Other:  No abdominal ascites. Musculoskeletal: No focal osseous lesions. IMPRESSION: Suspected 12 mm enhancing soft tissue lesion at the hilar confluence, raise concern for hilar cholangiocarcinoma/Klatskin tumor. Associated moderate intrahepatic ductal dilatation. 3.5 cm gallstone. Adjacent wall thickening/narrowing of the mid gallbladder, with surrounding enhancement, favored to reflect reactive inflammation. Gallbladder neoplasm remains possible but is considered less likely. Distal CBD is nondilated, measuring 6 mm. No choledocholithiasis is seen. Electronically Signed   By: Julian Hy M.D.   On: 02/25/2021 20:09    Microbiology: Recent Results (from the past 240 hour(s))  Resp Panel by RT-PCR (Flu A&B, Covid) Nasopharyngeal Swab     Status: None   Collection Time: 02/24/21  5:36 PM   Specimen: Nasopharyngeal Swab; Nasopharyngeal(NP) swabs in vial transport medium  Result Value Ref Range Status   SARS Coronavirus 2 by RT PCR NEGATIVE NEGATIVE Final    Comment: (NOTE) SARS-CoV-2 target nucleic acids are NOT DETECTED.  The SARS-CoV-2 RNA is generally detectable in upper respiratory specimens during the acute phase of infection. The lowest concentration of SARS-CoV-2 viral copies this assay can detect is 138 copies/mL. A negative result does not preclude SARS-Cov-2 infection and should not be used as the sole basis for treatment  or other patient management decisions. A negative result may occur with  improper specimen collection/handling, submission of specimen other than nasopharyngeal swab, presence of viral mutation(s) within the areas targeted by this assay, and inadequate number of viral copies(<138 copies/mL). A negative result must be combined with clinical observations, patient history, and epidemiological information. The expected result is Negative.  Fact Sheet for Patients:  EntrepreneurPulse.com.au  Fact Sheet for Healthcare Providers:  IncredibleEmployment.be  This test is no t yet approved or cleared by the [place] FDA and  has been authorized for detection and/or diagnosis of SARS-CoV-2 by FDA under an Emergency Use Authorization (EUA). This EUA will remain  in effect (meaning this test can be used) for the duration of the COVID-19 declaration under Section 564(b)(1) of the Act, 21 U.S.C.section 360bbb-3(b)(1), unless the authorization is terminated  or revoked sooner.       Influenza A by PCR NEGATIVE NEGATIVE Final   Influenza B by PCR NEGATIVE NEGATIVE Final    Comment: (NOTE) The Xpert Xpress SARS-CoV-2/FLU/RSV plus assay is intended as an aid in the diagnosis of influenza from Nasopharyngeal swab specimens and should not be used as a sole basis for treatment. Nasal washings and aspirates are unacceptable for Xpert Xpress SARS-CoV-2/FLU/RSV testing.  Fact Sheet for Patients: EntrepreneurPulse.com.au  Fact Sheet for Healthcare Providers: IncredibleEmployment.be  This test is not yet approved or cleared by the [place] FDA and has been authorized for detection and/or diagnosis of SARS-CoV-2 by FDA under an Emergency Use Authorization (EUA). This EUA will remain in effect (meaning this test can be used) for the duration of the COVID-19 declaration under Section 564(b)(1) of the Act, 21 U.S.C. section  360bbb-3(b)(1), unless the authorization is terminated or revoked.  Performed at [facility], [address] [place], [place] [address]      Labs: Basic Metabolic Panel: Recent Labs  Lab 02/24/21 1223 02/24/21 1233 02/25/21 0346 02/26/21 0356 02/27/21 0355  NA 130* 135 129* 130* 129*  K 4.1 4.2 3.9 4.3 3.9  CL 100 105 99 97* 100  CO2 22  --  '23 27 22  '$ GLUCOSE 194* 196* 179* 200* 149*  BUN '20 19 17 14 20  '$ CREATININE 0.57* 0.90 0.54* 0.61 0.73  CALCIUM 9.4  --  9.0 9.2  8.4*   Liver Function Tests: Recent Labs  Lab 02/24/21 1223 02/25/21 0346 02/26/21 0356 02/27/21 0355 02/28/21 0355  AST 111* 115* 120* 204* 117*  ALT 230* 208* 209* 245* 226*  ALKPHOS 355* 318* 344* 302* 295*  BILITOT 18.0* 17.6* 20.9* 19.3* 12.4*  PROT 7.1 6.3* 6.5 5.8* 6.0*  ALBUMIN 3.2* 2.8* 2.8* 2.5* 2.6*   Recent Labs  Lab 02/24/21 1223  LIPASE 34   CBC: Recent Labs  Lab 02/24/21 1223 02/24/21 1233 02/25/21 0346 02/26/21 0356  WBC 7.7  --  6.4 6.0  NEUTROABS 4.3  --   --   --   HGB 13.3 15.0 12.5* 12.4*  HCT 39.5 44.0 36.5* 36.9*  MCV 93.8  --  95.3 94.9  PLT 183  --  194 227   CBG: Recent Labs  Lab 02/27/21 1412 02/27/21 1616 02/27/21 2105 02/28/21 0802 02/28/21 1309  GLUCAP 109* 201* 247* 179* 226*    Signed:  Barton Dubois MD.  [facility] 02/28/2021, 5:02 PM

## 2021-02-28 NOTE — Progress Notes (Signed)
Subjective:  Jonathan Keith denies abdominal pain. Feels hungry. No n/v. Understands why procedure for percutaneous drainage of the left hepatic system was cancelled today.   Objective: Vital signs in last 24 hours: Temp:  [97.7 F (36.5 C)-98.3 F (36.8 C)] 98.1 F (36.7 C) (07/22 1050) Pulse Rate:  [58-83] 58 (07/22 1050) Resp:  [12-19] 19 (07/22 1050) BP: (123-175)/(67-91) 155/84 (07/22 1050) SpO2:  [91 %-100 %] 100 % (07/22 1050) FiO2 (%):  [32 %] 32 % (07/21 1359) Last BM Date: 02/24/21 General:   Alert,  Well-developed, well-nourished, pleasant and cooperative in NAD Head:  Normocephalic and atraumatic. Eyes:  Sclera clear, + icterus.  Abdomen:  Soft, nontender and nondistended.  Normal bowel sounds, without guarding, and without rebound.   Extremities:  Without clubbing, deformity or edema. Neurologic:  Alert and  oriented x4;  grossly normal neurologically. Skin:  Intact without significant lesions or rashes.+jaundice Psych:  Alert and cooperative. Normal mood and affect.  Intake/Output from previous day: 07/21 0701 - 07/22 0700 In: 1250 [I.V.:1200; IV Piggyback:50] Out: 0  Intake/Output this shift: No intake/output data recorded.  Lab Results: CBC Recent Labs    02/26/21 0356  WBC 6.0  HGB 12.4*  HCT 36.9*  MCV 94.9  PLT 227   BMET Recent Labs    02/26/21 0356 02/27/21 0355  NA 130* 129*  K 4.3 3.9  CL 97* 100  CO2 27 22  GLUCOSE 200* 149*  BUN 14 20  CREATININE 0.61 0.73  CALCIUM 9.2 8.4*   LFTs Recent Labs    02/26/21 0356 02/27/21 0355 02/28/21 0355  BILITOT 20.9* 19.3* 12.4*  BILIDIR  --   --  6.7*  IBILI  --   --  5.7*  ALKPHOS 344* 302* 295*  AST 120* 204* 117*  ALT 209* 245* 226*  PROT 6.5 5.8* 6.0*  ALBUMIN 2.8* 2.5* 2.6*   No results for input(s): LIPASE in the last 72 hours. PT/INR Recent Labs    02/27/21 1642  LABPROT 12.8  INR 1.0      Imaging Studies: CT ABDOMEN PELVIS W CONTRAST  Result Date: 02/24/2021 CLINICAL  DATA:  Abdominal distension, weakness, jaundice EXAM: CT ABDOMEN AND PELVIS WITH CONTRAST TECHNIQUE: Multidetector CT imaging of the abdomen and pelvis was performed using the standard protocol following bolus administration of intravenous contrast. CONTRAST:  140m OMNIPAQUE IOHEXOL 300 MG/ML  SOLN COMPARISON:  None. FINDINGS: Lower chest: No acute pleural or parenchymal lung disease. Minimal bilateral lower lobe bronchiectasis. Hepatobiliary: There is marked intrahepatic and extrahepatic biliary duct dilation, with common bile duct measuring up to 10 mm in size. There are multiple rounded areas of high attenuation within the common hepatic duct and common bile duct, reference images 22, 23, 26. Largest area measures 6 mm. The gallbladder is moderately distended. Circumferential wall thickening and enhancement is seen within the mid aspect of the gallbladder. This irregular area of gallbladder wall thickening measures up to 7 mm. Differential includes localized inflammatory change versus neoplasm. Pancreas: The pancreas enhances normally. Mild fatty atrophy of the pancreatic body and tail. No surrounding inflammatory changes. Spleen: Normal in size without focal abnormality. Adrenals/Urinary Tract: Adrenal glands are unremarkable. Kidneys are normal, without renal calculi, focal lesion, or hydronephrosis. Bladder is unremarkable. Stomach/Bowel: No bowel obstruction or ileus. Normal appendix right lower quadrant. Scattered diverticulosis throughout the colon. There is nonspecific wall thickening of the mid sigmoid colon without surrounding inflammation. This may reflect wall thickening due to under distension versus scarring from previous bouts of  diverticulitis. Vascular/Lymphatic: Aortic atherosclerosis. No enlarged abdominal or pelvic lymph nodes. Reproductive: Prostate is unremarkable. Other: No free fluid or free gas. Small fat containing umbilical hernia. No bowel herniation. Musculoskeletal: No acute or  destructive bony lesions. Reconstructed images demonstrate no additional findings. IMPRESSION: 1. Intrahepatic and extrahepatic biliary duct dilation, with multiple small high attenuation obstructing foci within the common hepatic and common bile duct as above. The appearance would favor multiple obstructing common bile duct calculi. ERCP may be useful for further evaluation and decompression of the biliary system. 2. Moderate gallbladder distension, with focal circumferential wall thickening and enhancement within the body of the gallbladder. Differential diagnosis includes focal cholecystitis versus neoplasm. If further imaging is desired, dedicated liver MRI with and without contrast could be considered. 3. Diverticulosis of the sigmoid colon without evidence of acute diverticulitis. Nonspecific sigmoid colon wall thickening most consistent with scarring from previous bouts of diverticulitis. 4.  Aortic Atherosclerosis (ICD10-I70.0). Electronically Signed   By: Randa Ngo M.D.   On: 02/24/2021 15:17   MR 3D Recon At Scanner  Result Date: 02/25/2021 CLINICAL DATA:  Jaundice, focal gallbladder wall thickening on CT EXAM: MRI ABDOMEN WITHOUT AND WITH CONTRAST (INCLUDING MRCP) TECHNIQUE: Multiplanar multisequence MR imaging of the abdomen was performed both before and after the administration of intravenous contrast. Heavily T2-weighted images of the biliary and pancreatic ducts were obtained, and three-dimensional MRCP images were rendered by post processing. CONTRAST:  4m GADAVIST GADOBUTROL 1 MMOL/ML IV SOLN COMPARISON:  CT abdomen/pelvis dated 02/24/2021 FINDINGS: Lower chest: Lung bases are clear. Hepatobiliary: Liver is within normal limits. No suspicious/enhancing hepatic lesions. However, there is associated moderate intrahepatic ductal dilatation with suspected abnormal enhancing soft tissue at the hilar confluence measuring 12 mm (series 23/image 43), raising the concern for a small hilar  cholangiocarcinoma/Klatskin tumor. Gallbladder is notable for a 3.5 cm gallstone (series 4/image 20). There is associated focal wall thickening with narrowing/stenosis (series 4/image 19) in the mid gallbladder, with enhancement (series 18/image 40) in this region and proximally along the gallstone. While neoplasm remains possible, this overall appearance favors reactive inflammation secondary to the large gallstone. Distal CBD is nondilated, measuring 6 mm. No choledocholithiasis is seen. Pancreas:  Within normal limits. Spleen:  Within normal limits. Adrenals/Urinary Tract:  Adrenal glands are within normal limits. Kidneys are within normal limits.  No hydronephrosis. Stomach/Bowel: Stomach is within normal limits. Visualized bowel is unremarkable. Vascular/Lymphatic:  No evidence of abdominal aortic aneurysm. No suspicious abdominal lymphadenopathy. Other:  No abdominal ascites. Musculoskeletal: No focal osseous lesions. IMPRESSION: Suspected 12 mm enhancing soft tissue lesion at the hilar confluence, raise concern for hilar cholangiocarcinoma/Klatskin tumor. Associated moderate intrahepatic ductal dilatation. 3.5 cm gallstone. Adjacent wall thickening/narrowing of the mid gallbladder, with surrounding enhancement, favored to reflect reactive inflammation. Gallbladder neoplasm remains possible but is considered less likely. Distal CBD is nondilated, measuring 6 mm. No choledocholithiasis is seen. Electronically Signed   By: SJulian HyM.D.   On: 02/25/2021 20:09   DG ERCP  Result Date: 02/27/2021 CLINICAL DATA:  History of cholangio carcinoma with obstructive jaundice, post ERCP. EXAM: ERCP TECHNIQUE: Multiple spot images obtained with the fluoroscopic device and submitted for interpretation post-procedure. COMPARISON:  ERCP-02/25/2021 CT abdomen pelvis-02/24/2021 MRCP-02/25/2021 FLUOROSCOPY TIME:  6 minutes, 9 seconds (141.5 mGy) FINDINGS: Multiple spot fluoroscopic images of the right upper abdominal  quadrant during ERCP are provided for review. Initial image demonstrates an ERCP probe overlying the right upper abdominal quadrant. A biliary stent overlies expected location of the CBD.  Subsequent images demonstrate removal of the biliary stent with subsequent cannulation and opacification of the CBD which demonstrates tapered irregularity at its central aspect. There is moderate to marked dilatation of the opacified central aspect of the intrahepatic biliary tree. Subsequent images demonstrate apparent biliary plasty at the level the hilum. Completion images demonstrate placement of a new internal biliary stent. IMPRESSION: ERCP with biliary plasty and stent exchange above. These images were submitted for radiologic interpretation only. Please see the procedural report for the amount of contrast and the fluoroscopy time utilized. Electronically Signed   By: Sandi Mariscal M.D.   On: 02/27/2021 14:08   DG ERCP  Result Date: 02/25/2021 CLINICAL DATA:  Jaundice EXAM: ERCP TECHNIQUE: Multiple spot images obtained with the fluoroscopic device and submitted for interpretation post-procedure. COMPARISON:  CT 02/24/2021 FINDINGS: Series of fluoroscopic spot images document endoscopic cannulation and opacification of the pancreatic duct and subsequently the biliary tree. There is segmental narrowing of proximal and mid CBD on limited images. Subsequent images document placement of plastic biliary stent. The intrahepatic biliary tree is incompletely opacified, appearing dilated centrally. IMPRESSION: 1. Segmental CBD narrowing, with endoscopic stent placement. These images were submitted for radiologic interpretation only. Please see the procedural report for the amount of contrast and the fluoroscopy time utilized. Electronically Signed   By: Lucrezia Europe M.D.   On: 02/25/2021 15:02   MR ABDOMEN MRCP W WO CONTAST  Result Date: 02/25/2021 CLINICAL DATA:  Jaundice, focal gallbladder wall thickening on CT EXAM: MRI  ABDOMEN WITHOUT AND WITH CONTRAST (INCLUDING MRCP) TECHNIQUE: Multiplanar multisequence MR imaging of the abdomen was performed both before and after the administration of intravenous contrast. Heavily T2-weighted images of the biliary and pancreatic ducts were obtained, and three-dimensional MRCP images were rendered by post processing. CONTRAST:  52m GADAVIST GADOBUTROL 1 MMOL/ML IV SOLN COMPARISON:  CT abdomen/pelvis dated 02/24/2021 FINDINGS: Lower chest: Lung bases are clear. Hepatobiliary: Liver is within normal limits. No suspicious/enhancing hepatic lesions. However, there is associated moderate intrahepatic ductal dilatation with suspected abnormal enhancing soft tissue at the hilar confluence measuring 12 mm (series 23/image 43), raising the concern for a small hilar cholangiocarcinoma/Klatskin tumor. Gallbladder is notable for a 3.5 cm gallstone (series 4/image 20). There is associated focal wall thickening with narrowing/stenosis (series 4/image 19) in the mid gallbladder, with enhancement (series 18/image 40) in this region and proximally along the gallstone. While neoplasm remains possible, this overall appearance favors reactive inflammation secondary to the large gallstone. Distal CBD is nondilated, measuring 6 mm. No choledocholithiasis is seen. Pancreas:  Within normal limits. Spleen:  Within normal limits. Adrenals/Urinary Tract:  Adrenal glands are within normal limits. Kidneys are within normal limits.  No hydronephrosis. Stomach/Bowel: Stomach is within normal limits. Visualized bowel is unremarkable. Vascular/Lymphatic:  No evidence of abdominal aortic aneurysm. No suspicious abdominal lymphadenopathy. Other:  No abdominal ascites. Musculoskeletal: No focal osseous lesions. IMPRESSION: Suspected 12 mm enhancing soft tissue lesion at the hilar confluence, raise concern for hilar cholangiocarcinoma/Klatskin tumor. Associated moderate intrahepatic ductal dilatation. 3.5 cm gallstone. Adjacent  wall thickening/narrowing of the mid gallbladder, with surrounding enhancement, favored to reflect reactive inflammation. Gallbladder neoplasm remains possible but is considered less likely. Distal CBD is nondilated, measuring 6 mm. No choledocholithiasis is seen. Electronically Signed   By: SJulian HyM.D.   On: 02/25/2021 20:09  [2 weeks]   Assessment:  Obstructive painless jaundice: Initial total bilirubin 18, alk phos 355, AST 111, ALT 230.  CT with intrahepatic and extrahepatic biliary duct  dilation with multiple small hypoattenuation obstructing foci within the common hepatic and common bile duct favor calculi.  Moderate gallbladder distention with focal circumferential wall thickening and enhancement within the gallbladder body.    Initial ERCP July 19, showed 2 high-grade biliary strictures found in the upper third of the main bile duct and common hepatic duct. Proximal stricture was about 2 cm long and the distal stricture was passed at length.The pancreatogram was normal. A biliary sphincterotomy was performed.  Plastic stent placed into the common bile duct.  Cytology returned with cells consistent with adenocarcinoma.    MRCP showed 12 mm enhancing soft tissue lesion at the hilar confluence, raising concern for hilar cholangiocarcinoma/Klatskin tumor.  Associated moderate intrahepatic duct dilatation noted.  Adjacent wall thickening/narrowing at the mid gallbladder with surrounding enhancement, favored to reflect reactive inflammation.  Distal common bile duct nondilated, measuring 6 mm.  No choledocholithiasis.Total bili rose to 20.9 indicating poor drainage through biliary stent.   Second ERCP performed 7/21, Biliary stent was removed.Hilar stricture with dilated right hepatic system but left system not filled with contrast or cannulated with guidewires. Follow stricture was dilated with a balloon to 6 mm.10 French 9 cm long plastic stent placed across the stricture into right  hepatic system.   Total bili down to 12.4 today. Alk phos 295, AST 117, ALT 226. CA 19-9 429, CEA 4.9.   Jonathan Keith went to [facility] today with intentions of percutaneous biliary drain placement, however after discussions between Dr. Laural Golden and Dr. Gayleen Orem regarding Jonathan Keith's case, it was felt that he should be considered for liver transplant as curative method for Jonathan Keith. It was advised to hold off on percutaneous drainage as his bilirubin has improved and such procedures could negatively impact his candidacy for liver transplantation. Jonathan Keith and family aware. We are in the process of arranging outpatient consultation with Dr. Whitman Hero [facility], [place]) for evaluation for transplant for cholangiocarcinoma. Call has been placed, waiting for call back from Dr. Annamary Rummage.    Sigmoid wall thickening: Nothing to suggest colitis at this time. No prior colonoscopy. He will need first ever colonoscopy outpatient for further evaluation.   Plan: Soft diet.  Dr. Jenetta Downer to discuss further with Jonathan Keith and family. Jonathan Keith could be potentially discharged later today after seen by Dr. Jenetta Downer.   Laureen Ochs. Bernarda Caffey [facility] [phone] 7/22/20221:13 PM    LOS: 4 days

## 2021-03-03 ENCOUNTER — Telehealth (INDEPENDENT_AMBULATORY_CARE_PROVIDER_SITE_OTHER): Payer: Self-pay | Admitting: *Deleted

## 2021-03-03 NOTE — Telephone Encounter (Signed)
  Wife Thayer Headings ( on dpr ) calling to see if pt can have extra strength tylenol for pain. Dr Laural Golden did procedure on 7/21. Wife states he is needing a liver transplant and he has had a headache off and on since yesterday and does not like the way the oxycodone '5mg'$  makes him feel.  [phone]

## 2021-03-03 NOTE — Telephone Encounter (Signed)
Per Dr. Laural Golden. Ok to take tylenol ES. No more than 2 g on a given day. Discussed with pt's wife and she verbalized understanding.

## 2021-03-04 NOTE — Telephone Encounter (Signed)
error 

## 2021-03-06 ENCOUNTER — Telehealth (INDEPENDENT_AMBULATORY_CARE_PROVIDER_SITE_OTHER): Payer: Self-pay | Admitting: Internal Medicine

## 2021-03-06 ENCOUNTER — Encounter (INDEPENDENT_AMBULATORY_CARE_PROVIDER_SITE_OTHER): Payer: Self-pay | Admitting: Internal Medicine

## 2021-03-06 NOTE — Telephone Encounter (Signed)
Dr. Whitman Hero return my call from [facility]. I discussed Mr. Rhet with his condition with him. He will contact patient and or his wife and arrange for office visit. In the meantime I will ask imaging studies powerchair to him. Patient called and message left for them to call me in the office.

## 2021-03-07 ENCOUNTER — Inpatient Hospital Stay (HOSPITAL_COMMUNITY): Payer: Commercial Managed Care - PPO | Attending: Hematology and Oncology | Admitting: Hematology and Oncology

## 2021-03-07 ENCOUNTER — Encounter (HOSPITAL_COMMUNITY): Payer: Self-pay | Admitting: Hematology and Oncology

## 2021-03-07 ENCOUNTER — Inpatient Hospital Stay (HOSPITAL_COMMUNITY): Payer: Commercial Managed Care - PPO

## 2021-03-07 ENCOUNTER — Other Ambulatory Visit: Payer: Self-pay

## 2021-03-07 VITALS — BP 140/73 | HR 87 | Temp 98.3°F | Resp 16 | Ht 71.0 in | Wt 191.4 lb

## 2021-03-07 DIAGNOSIS — R7989 Other specified abnormal findings of blood chemistry: Secondary | ICD-10-CM

## 2021-03-07 DIAGNOSIS — F17210 Nicotine dependence, cigarettes, uncomplicated: Secondary | ICD-10-CM | POA: Diagnosis not present

## 2021-03-07 DIAGNOSIS — K5730 Diverticulosis of large intestine without perforation or abscess without bleeding: Secondary | ICD-10-CM

## 2021-03-07 DIAGNOSIS — K429 Umbilical hernia without obstruction or gangrene: Secondary | ICD-10-CM | POA: Insufficient documentation

## 2021-03-07 DIAGNOSIS — E785 Hyperlipidemia, unspecified: Secondary | ICD-10-CM | POA: Diagnosis not present

## 2021-03-07 DIAGNOSIS — J479 Bronchiectasis, uncomplicated: Secondary | ICD-10-CM | POA: Insufficient documentation

## 2021-03-07 DIAGNOSIS — Z79899 Other long term (current) drug therapy: Secondary | ICD-10-CM | POA: Diagnosis not present

## 2021-03-07 DIAGNOSIS — E1165 Type 2 diabetes mellitus with hyperglycemia: Secondary | ICD-10-CM | POA: Insufficient documentation

## 2021-03-07 DIAGNOSIS — C221 Intrahepatic bile duct carcinoma: Secondary | ICD-10-CM

## 2021-03-07 DIAGNOSIS — R531 Weakness: Secondary | ICD-10-CM

## 2021-03-07 DIAGNOSIS — K8021 Calculus of gallbladder without cholecystitis with obstruction: Secondary | ICD-10-CM | POA: Diagnosis not present

## 2021-03-07 DIAGNOSIS — Z8249 Family history of ischemic heart disease and other diseases of the circulatory system: Secondary | ICD-10-CM | POA: Diagnosis not present

## 2021-03-07 DIAGNOSIS — I700 Atherosclerosis of aorta: Secondary | ICD-10-CM | POA: Insufficient documentation

## 2021-03-07 DIAGNOSIS — I10 Essential (primary) hypertension: Secondary | ICD-10-CM | POA: Diagnosis not present

## 2021-03-07 LAB — COMPREHENSIVE METABOLIC PANEL
ALT: 171 U/L — ABNORMAL HIGH (ref 0–44)
AST: 95 U/L — ABNORMAL HIGH (ref 15–41)
Albumin: 3 g/dL — ABNORMAL LOW (ref 3.5–5.0)
Alkaline Phosphatase: 241 U/L — ABNORMAL HIGH (ref 38–126)
Anion gap: 5 (ref 5–15)
BUN: 16 mg/dL (ref 8–23)
CO2: 26 mmol/L (ref 22–32)
Calcium: 9.1 mg/dL (ref 8.9–10.3)
Chloride: 100 mmol/L (ref 98–111)
Creatinine, Ser: 0.82 mg/dL (ref 0.61–1.24)
GFR, Estimated: >60 mL/min (ref >60)
Glucose, Bld: 185 mg/dL — ABNORMAL HIGH (ref 70–99)
Potassium: 4.4 mmol/L (ref 3.5–5.1)
Sodium: 131 mmol/L — ABNORMAL LOW (ref 135–145)
Total Bilirubin: 5.2 mg/dL — ABNORMAL HIGH (ref 0.3–1.2)
Total Protein: 6.5 g/dL (ref 6.5–8.1)

## 2021-03-07 LAB — CBC WITH DIFFERENTIAL/PLATELET
Abs Immature Granulocytes: 0.03 10*3/uL (ref 0.00–0.07)
Basophils Absolute: 0.1 10*3/uL (ref 0.0–0.1)
Basophils Relative: 1 %
Eosinophils Absolute: 0.5 10*3/uL (ref 0.0–0.5)
Eosinophils Relative: 6 %
HCT: 33.7 % — ABNORMAL LOW (ref 39.0–52.0)
Hemoglobin: 11.6 g/dL — ABNORMAL LOW (ref 13.0–17.0)
Immature Granulocytes: 0 %
Lymphocytes Relative: 27 %
Lymphs Abs: 2.6 10*3/uL (ref 0.7–4.0)
MCH: 33.5 pg (ref 26.0–34.0)
MCHC: 34.4 g/dL (ref 30.0–36.0)
MCV: 97.4 fL (ref 80.0–100.0)
Monocytes Absolute: 1.1 10*3/uL — ABNORMAL HIGH (ref 0.1–1.0)
Monocytes Relative: 12 %
Neutro Abs: 5.1 10*3/uL (ref 1.7–7.7)
Neutrophils Relative %: 54 %
Platelets: 256 10*3/uL (ref 150–400)
RBC: 3.46 MIL/uL — ABNORMAL LOW (ref 4.22–5.81)
RDW: 13.6 % (ref 11.5–15.5)
WBC: 9.3 10*3/uL (ref 4.0–10.5)
nRBC: 0 % (ref 0.0–0.2)

## 2021-03-07 NOTE — Progress Notes (Signed)
[place] CONSULT NOTE  Patient Care Team: Ginger Organ as PCP - General (Physician Assistant)  CHIEF COMPLAINTS/PURPOSE OF CONSULTATION:  Intrahepatic cholangiocarcinoma, initial consultation.  ASSESSMENT & PLAN:  This is a very pleasant 65 year old male patient with past medical history significant for diabetes mellitus, dyslipidemia, active smoker newly diagnosed with intrahepatic cholangiocarcinoma establishing with medical oncology.  Baseline CEA of 4.9 and CA 19-9 of 429.  He was recently discharged from the hospital after presenting with obstructive jaundice, he had biliary stent placed in the right side, patient was unable to have left hepatic duct stent placement, completely occluded and pathology suggesting adenocarcinoma.  Apparently he was referred to transplant team at [facility] for consideration of surgical evaluation since he has no clear evidence of metastatic disease.  However he does not have CT chest to complete staging hence this was ordered.  If his CT chest does indeed show no evidence of metastatic disease and he has adenocarcinoma restricted to intrahepatic biliary system, 1 could consider laparoscopy to confirm absence of metastatic disease and if no evidence of metastatic disease, then I agree he should be considered for discussion about surgery. He has appointments at [facility] on August 11 and August 12.  In the interim we will try to complete CT chest staging and we will also try to present him in the GI tumor board however message will be sent to Dr. Delton Coombes if he can present him. He is interested in proceeding with a surgical evaluation in [place] if possible however at this time since he has an appointment with [facility], I encouraged him to keep it.  He should follow-up in about a week or so with medical oncology after imaging for further recommendations. We have also discussed about genetic testing outpatient given his  diagnosis, referral placed. Thank you for consulting [place] in the care of this patient.  Please not hesitate to contact us with any additional questions or concerns.  HISTORY OF PRESENTING ILLNESS:   Jonathan Keith 65 y.o. male is here because of new diagnosis of cholangiocarcinoma  Chronology This is a very pleasant 65 year old healthy male patient with past medical history significant for diabetes mellitus, hypertension and dyslipidemia who presented initially to the ER with chief complaint of fatigue, generalized weakness and jaundice.  He went to see his primary care physician with above-mentioned complaints and was sent to the emergency department for further work-up.  He had CT abdomen pelvis which showed intrahepatic and extrahepatic biliary duct dilatation with multiple small high attenuation obstructing foci within the common hepatic and common bile duct.  There was moderate gallbladder distention with circumferential wall thickening and enhancement within the body of the gallbladder.  He had endoscopic retrograde cholangiopancreatography by gastroenterology and cytology brushings suggested presence of adenocarcinoma consistent with a possible cholangiocarcinoma he had ERCP with plans for double stent placement in his hepatic ducts, only right-sided was able to be placed, patient with completely occluded left hepatic duct.  He is discharged and is now here for follow-up with medical oncology. He was found to have uncontrolled diabetes with hyperglycemia while he was hospitalized with hemoglobin A1c of 8.7.  His pravastatin was held in the setting of elevated liver function.  Apparently he was referred to [facility] for transplant evaluation given no clear evidence of metastatic disease.  He is here with his wife for an initial visit, continues to appear  jaundiced and tired, denies any pain.  No underlying alcohol issues  or hepatic issues.  He is an active smoker, smokes about 1-1 and half pack per  day.  He did not have any chest imaging for complete staging.  While he was in the visit, he got a call from [facility] about a scheduled appointment on August 11 and 12 for a transplant evaluation. 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  Besides the fatigue and jaundice, she does not report any overt weight loss, he may have lost weight during the hospitalization.  No family history of pancreatic cancer or cholangiocarcinoma or ovarian cancer or breast cancer.  Rest of the pertinent 10 point ROS reviewed and negative.   MEDICAL HISTORY:  Past Medical History:  Diagnosis Date   Chronic pain    neck, back, knees   Class 1 obesity due to excess calories with body mass index (BMI) of 31.0 to 31.9 in adult    Claudication [facility])    DDD (degenerative disc disease), cervical    Diabetes mellitus ([place])    Dyslipidemia    Hypertension    Tobacco dependence     SURGICAL HISTORY: Past Surgical History:  Procedure Laterality Date   BILIARY BRUSHING N/A 02/25/2021   Procedure: BILIARY BRUSHING;  Surgeon: Rogene Houston, MD;  Location: [facility];  Service: Gastroenterology;  Laterality: N/A;   BILIARY STENT PLACEMENT N/A 02/25/2021   Procedure: BILIARY STENT PLACEMENT;  Surgeon: Rogene Houston, MD;  Location: [facility];  Service: Gastroenterology;  Laterality: N/A;   BILIARY STENT PLACEMENT  02/27/2021   Procedure: BILIARY STENT PLACEMENT (10FR x 9cm) IN THE RIGHT SYSTEM;  Surgeon: Rogene Houston, MD;  Location: [facility];  Service: Gastroenterology;;   BREAST SURGERY Left    benign lump- in his 48s.   ERCP N/A 02/25/2021   Procedure: ENDOSCOPIC RETROGRADE CHOLANGIOPANCREATOGRAPHY (ERCP);  Surgeon: Rogene Houston, MD;  Location: [facility];  Service: Gastroenterology;  Laterality: N/A;   ERCP N/A 02/27/2021   Procedure: ENDOSCOPIC RETROGRADE  CHOLANGIOPANCREATOGRAPHY (ERCP);  Surgeon: Rogene Houston, MD;  Location: [facility];  Service: Gastroenterology;  Laterality: N/A;   KNEE SURGERY Left    x2   SPHINCTEROTOMY N/A 02/25/2021   Procedure: SPHINCTEROTOMY;  Surgeon: Rogene Houston, MD;  Location: [facility];  Service: Gastroenterology;  Laterality: N/A;   STENT REMOVAL  02/27/2021   Procedure: STENT REMOVAL (8.5Fr x 9cm);  Surgeon: Rogene Houston, MD;  Location: [facility];  Service: Gastroenterology;;    SOCIAL HISTORY: Social History   Socioeconomic History   Marital status: Married    Spouse name: Not on file   Number of children: Not on file   Years of education: Not on file   Highest education level: Not on file  Occupational History   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  Tobacco Use   Smoking status: Every Day    Packs/day: 2.00    Years: 56.00    Pack years: 112.00    Types: Cigarettes   Smokeless tobacco: Never  Vaping Use   Vaping Use: Never used  Substance and Sexual Activity   Alcohol use: Not Currently    Comment: rare - 2 this last week but that was the first time in maybe 18 months   Drug use: Never   Sexual activity: Not on file  Other Topics Concern   Not on file  Social History Narrative  Not on file   Social Determinants of Health   Financial Resource Strain: Medium Risk   Difficulty of Paying Living Expenses: Somewhat hard  Food Insecurity: No Food Insecurity   Worried About Running Out of Food in the Last Year: Never true   Ran Out of Food in the Last Year: Never true  Transportation Needs: No Transportation Needs   Lack of Transportation (Medical): No   Lack of Transportation (Non-Medical): No  Physical Activity: Insufficiently Active   Days of Exercise per Week: 2 days   Minutes of Exercise per Session: 20 min  Stress: No Stress Concern Present   Feeling of Stress : Only a little  Social Connections: Moderately Integrated   Frequency of Communication with Friends and Family: More  than three times a week   Frequency of Social Gatherings with Friends and Family: More than three times a week   Attends Religious Services: More than 4 times per year   Active Member of [employer] or Organizations: No   Attends Music therapist: Never   Marital Status: Married  Human resources officer Violence: Not At Risk   Fear of Current or Ex-Partner: No   Emotionally Abused: No   Physically Abused: No   Sexually Abused: No    FAMILY HISTORY: Family History  Problem Relation Age of Onset   CAD Mother    Cancer Neg Hx     ALLERGIES:  has No Known Allergies.  MEDICATIONS:  Current Outpatient Medications  Medication Sig Dispense Refill   amoxicillin-clavulanate (AUGMENTIN) 875-125 MG tablet Take 1 tablet by mouth every 12 (twelve) hours. 10 tablet 0   Calcium Carb-Cholecalciferol (SUPER CALCIUM 600 + D 400 PO) Take by mouth.     metFORMIN (GLUCOPHAGE-XR) 500 MG 24 hr tablet Take 500 mg by mouth daily.     Omega-3 Fatty Acids (FISH OIL) 1000 MG CPDR Take 2 g by mouth in the morning and at bedtime.     pantoprazole (PROTONIX) 40 MG tablet Take 1 tablet (40 mg total) by mouth daily. 30 tablet 1   ondansetron (ZOFRAN ODT) 8 MG disintegrating tablet Take 1 tablet (8 mg total) by mouth every 8 (eight) hours as needed for nausea or vomiting. (Patient not taking: Reported on 03/07/2021) 20 tablet 0   oxyCODONE (OXY IR/ROXICODONE) 5 MG immediate release tablet Take 1 tablet (5 mg total) by mouth every 4 (four) hours as needed for severe pain. (Patient not taking: Reported on 03/07/2021) 20 tablet 0   No current facility-administered medications for this visit.     PHYSICAL EXAMINATION: ECOG PERFORMANCE STATUS: 0 - Asymptomatic  Vitals:   03/07/21 1313  BP: 140/73  Pulse: 87  Resp: 16  Temp: 98.3 F (36.8 C)  SpO2: 97%   Filed Weights   03/07/21 1313  Weight: 191 lb 6.4 oz (86.8 kg)    GENERAL:alert, no distress and comfortable, appears icteric SKIN: skin color, texture,  turgor are normal, no rashes or significant lesions EYES: Sclera appears icteric. OROPHARYNX:no exudate, no erythema and lips, buccal mucosa, and tongue normal  NECK: supple, thyroid normal size, non-tender, without nodularity LYMPH:  no palpable lymphadenopathy in the cervical, axillary LUNGS: clear to auscultation and percussion with normal breathing effort HEART: regular rate & rhythm and no murmurs and no lower extremity edema ABDOMEN:abdomen soft, non-tender and normal bowel sounds, no palpable mass.  No hepatosplenomegaly Musculoskeletal:no cyanosis of digits and no clubbing  PSYCH: alert & oriented x 3 with fluent speech NEURO: no focal motor/sensory deficits  LABORATORY DATA:  I have reviewed the data as listed Lab Results  Component Value Date   WBC 6.0 02/26/2021   HGB 12.4 (L) 02/26/2021   HCT 36.9 (L) 02/26/2021   MCV 94.9 02/26/2021   PLT 227 02/26/2021     Chemistry      Component Value Date/Time   NA 129 (L) 02/27/2021 0355   K 3.9 02/27/2021 0355   CL 100 02/27/2021 0355   CO2 22 02/27/2021 0355   BUN 20 02/27/2021 0355   CREATININE 0.73 02/27/2021 0355      Component Value Date/Time   CALCIUM 8.4 (L) 02/27/2021 0355   ALKPHOS 295 (H) 02/28/2021 0355   AST 117 (H) 02/28/2021 0355   ALT 226 (H) 02/28/2021 0355   BILITOT 12.4 (H) 02/28/2021 0355       RADIOGRAPHIC STUDIES: I have personally reviewed the radiological images as listed and agreed with the findings in the report. CT ABDOMEN PELVIS W CONTRAST  Result Date: 02/24/2021 CLINICAL DATA:  Abdominal distension, weakness, jaundice EXAM: CT ABDOMEN AND PELVIS WITH CONTRAST TECHNIQUE: Multidetector CT imaging of the abdomen and pelvis was performed using the standard protocol following bolus administration of intravenous contrast. CONTRAST:  166m OMNIPAQUE IOHEXOL 300 MG/ML  SOLN COMPARISON:  None. FINDINGS: Lower chest: No acute pleural or parenchymal lung disease. Minimal bilateral lower lobe  bronchiectasis. Hepatobiliary: There is marked intrahepatic and extrahepatic biliary duct dilation, with common bile duct measuring up to 10 mm in size. There are multiple rounded areas of high attenuation within the common hepatic duct and common bile duct, reference images 22, 23, 26. Largest area measures 6 mm. The gallbladder is moderately distended. Circumferential wall thickening and enhancement is seen within the mid aspect of the gallbladder. This irregular area of gallbladder wall thickening measures up to 7 mm. Differential includes localized inflammatory change versus neoplasm. Pancreas: The pancreas enhances normally. Mild fatty atrophy of the pancreatic body and tail. No surrounding inflammatory changes. Spleen: Normal in size without focal abnormality. Adrenals/Urinary Tract: Adrenal glands are unremarkable. Kidneys are normal, without renal calculi, focal lesion, or hydronephrosis. Bladder is unremarkable. Stomach/Bowel: No bowel obstruction or ileus. Normal appendix right lower quadrant. Scattered diverticulosis throughout the colon. There is nonspecific wall thickening of the mid sigmoid colon without surrounding inflammation. This may reflect wall thickening due to under distension versus scarring from previous bouts of diverticulitis. Vascular/Lymphatic: Aortic atherosclerosis. No enlarged abdominal or pelvic lymph nodes. Reproductive: Prostate is unremarkable. Other: No free fluid or free gas. Small fat containing umbilical hernia. No bowel herniation. Musculoskeletal: No acute or destructive bony lesions. Reconstructed images demonstrate no additional findings. IMPRESSION: 1. Intrahepatic and extrahepatic biliary duct dilation, with multiple small high attenuation obstructing foci within the common hepatic and common bile duct as above. The appearance would favor multiple obstructing common bile duct calculi. ERCP may be useful for further evaluation and decompression of the biliary system. 2.  Moderate gallbladder distension, with focal circumferential wall thickening and enhancement within the body of the gallbladder. Differential diagnosis includes focal cholecystitis versus neoplasm. If further imaging is desired, dedicated liver MRI with and without contrast could be considered. 3. Diverticulosis of the sigmoid colon without evidence of acute diverticulitis. Nonspecific sigmoid colon wall thickening most consistent with scarring from previous bouts of diverticulitis. 4.  Aortic Atherosclerosis (ICD10-I70.0). Electronically Signed   By: MRanda NgoM.D.   On: 02/24/2021 15:17   MR 3D Recon At Scanner  Result Date: 02/25/2021 CLINICAL DATA:  Jaundice, focal gallbladder wall  thickening on CT EXAM: MRI ABDOMEN WITHOUT AND WITH CONTRAST (INCLUDING MRCP) TECHNIQUE: Multiplanar multisequence MR imaging of the abdomen was performed both before and after the administration of intravenous contrast. Heavily T2-weighted images of the biliary and pancreatic ducts were obtained, and three-dimensional MRCP images were rendered by post processing. CONTRAST:  61m GADAVIST GADOBUTROL 1 MMOL/ML IV SOLN COMPARISON:  CT abdomen/pelvis dated 02/24/2021 FINDINGS: Lower chest: Lung bases are clear. Hepatobiliary: Liver is within normal limits. No suspicious/enhancing hepatic lesions. However, there is associated moderate intrahepatic ductal dilatation with suspected abnormal enhancing soft tissue at the hilar confluence measuring 12 mm (series 23/image 43), raising the concern for a small hilar cholangiocarcinoma/Klatskin tumor. Gallbladder is notable for a 3.5 cm gallstone (series 4/image 20). There is associated focal wall thickening with narrowing/stenosis (series 4/image 19) in the mid gallbladder, with enhancement (series 18/image 40) in this region and proximally along the gallstone. While neoplasm remains possible, this overall appearance favors reactive inflammation secondary to the large gallstone. Distal CBD  is nondilated, measuring 6 mm. No choledocholithiasis is seen. Pancreas:  Within normal limits. Spleen:  Within normal limits. Adrenals/Urinary Tract:  Adrenal glands are within normal limits. Kidneys are within normal limits.  No hydronephrosis. Stomach/Bowel: Stomach is within normal limits. Visualized bowel is unremarkable. Vascular/Lymphatic:  No evidence of abdominal aortic aneurysm. No suspicious abdominal lymphadenopathy. Other:  No abdominal ascites. Musculoskeletal: No focal osseous lesions. IMPRESSION: Suspected 12 mm enhancing soft tissue lesion at the hilar confluence, raise concern for hilar cholangiocarcinoma/Klatskin tumor. Associated moderate intrahepatic ductal dilatation. 3.5 cm gallstone. Adjacent wall thickening/narrowing of the mid gallbladder, with surrounding enhancement, favored to reflect reactive inflammation. Gallbladder neoplasm remains possible but is considered less likely. Distal CBD is nondilated, measuring 6 mm. No choledocholithiasis is seen. Electronically Signed   By: SJulian HyM.D.   On: 02/25/2021 20:09   DG ERCP  Result Date: 02/27/2021 CLINICAL DATA:  History of cholangio carcinoma with obstructive jaundice, post ERCP. EXAM: ERCP TECHNIQUE: Multiple spot images obtained with the fluoroscopic device and submitted for interpretation post-procedure. COMPARISON:  ERCP-02/25/2021 CT abdomen pelvis-02/24/2021 MRCP-02/25/2021 FLUOROSCOPY TIME:  6 minutes, 9 seconds (141.5 mGy) FINDINGS: Multiple spot fluoroscopic images of the right upper abdominal quadrant during ERCP are provided for review. Initial image demonstrates an ERCP probe overlying the right upper abdominal quadrant. A biliary stent overlies expected location of the CBD. Subsequent images demonstrate removal of the biliary stent with subsequent cannulation and opacification of the CBD which demonstrates tapered irregularity at its central aspect. There is moderate to marked dilatation of the opacified central  aspect of the intrahepatic biliary tree. Subsequent images demonstrate apparent biliary plasty at the level the hilum. Completion images demonstrate placement of a new internal biliary stent. IMPRESSION: ERCP with biliary plasty and stent exchange above. These images were submitted for radiologic interpretation only. Please see the procedural report for the amount of contrast and the fluoroscopy time utilized. Electronically Signed   By: JSandi MariscalM.D.   On: 02/27/2021 14:08   DG ERCP  Result Date: 02/25/2021 CLINICAL DATA:  Jaundice EXAM: ERCP TECHNIQUE: Multiple spot images obtained with the fluoroscopic device and submitted for interpretation post-procedure. COMPARISON:  CT 02/24/2021 FINDINGS: Series of fluoroscopic spot images document endoscopic cannulation and opacification of the pancreatic duct and subsequently the biliary tree. There is segmental narrowing of proximal and mid CBD on limited images. Subsequent images document placement of plastic biliary stent. The intrahepatic biliary tree is incompletely opacified, appearing dilated centrally. IMPRESSION: 1. Segmental CBD  narrowing, with endoscopic stent placement. These images were submitted for radiologic interpretation only. Please see the procedural report for the amount of contrast and the fluoroscopy time utilized. Electronically Signed   By: Lucrezia Europe M.D.   On: 02/25/2021 15:02   MR ABDOMEN MRCP W WO CONTAST  Result Date: 02/25/2021 CLINICAL DATA:  Jaundice, focal gallbladder wall thickening on CT EXAM: MRI ABDOMEN WITHOUT AND WITH CONTRAST (INCLUDING MRCP) TECHNIQUE: Multiplanar multisequence MR imaging of the abdomen was performed both before and after the administration of intravenous contrast. Heavily T2-weighted images of the biliary and pancreatic ducts were obtained, and three-dimensional MRCP images were rendered by post processing. CONTRAST:  62m GADAVIST GADOBUTROL 1 MMOL/ML IV SOLN COMPARISON:  CT abdomen/pelvis dated  02/24/2021 FINDINGS: Lower chest: Lung bases are clear. Hepatobiliary: Liver is within normal limits. No suspicious/enhancing hepatic lesions. However, there is associated moderate intrahepatic ductal dilatation with suspected abnormal enhancing soft tissue at the hilar confluence measuring 12 mm (series 23/image 43), raising the concern for a small hilar cholangiocarcinoma/Klatskin tumor. Gallbladder is notable for a 3.5 cm gallstone (series 4/image 20). There is associated focal wall thickening with narrowing/stenosis (series 4/image 19) in the mid gallbladder, with enhancement (series 18/image 40) in this region and proximally along the gallstone. While neoplasm remains possible, this overall appearance favors reactive inflammation secondary to the large gallstone. Distal CBD is nondilated, measuring 6 mm. No choledocholithiasis is seen. Pancreas:  Within normal limits. Spleen:  Within normal limits. Adrenals/Urinary Tract:  Adrenal glands are within normal limits. Kidneys are within normal limits.  No hydronephrosis. Stomach/Bowel: Stomach is within normal limits. Visualized bowel is unremarkable. Vascular/Lymphatic:  No evidence of abdominal aortic aneurysm. No suspicious abdominal lymphadenopathy. Other:  No abdominal ascites. Musculoskeletal: No focal osseous lesions. IMPRESSION: Suspected 12 mm enhancing soft tissue lesion at the hilar confluence, raise concern for hilar cholangiocarcinoma/Klatskin tumor. Associated moderate intrahepatic ductal dilatation. 3.5 cm gallstone. Adjacent wall thickening/narrowing of the mid gallbladder, with surrounding enhancement, favored to reflect reactive inflammation. Gallbladder neoplasm remains possible but is considered less likely. Distal CBD is nondilated, measuring 6 mm. No choledocholithiasis is seen. Electronically Signed   By: SJulian HyM.D.   On: 02/25/2021 20:09   I have reviewed pertinent imaging and pathology results. All questions were answered. The  patient knows to call the clinic with any problems, questions or concerns. I spent 60 minutes in the care of this patient including H and P, review of records, counseling and coordination of care.     PBenay Pike MD 03/07/2021 1:56 PM

## 2021-03-07 NOTE — Progress Notes (Signed)
I met with the patient and his wife today prior to the appt with Dr. Chryl Heck. I introduced myself and explained my role in the patient's care. I provided my contact information and encouraged the patient and wife to call with any question or concerns.

## 2021-03-10 ENCOUNTER — Telehealth: Payer: Self-pay | Admitting: Licensed Clinical Social Worker

## 2021-03-10 NOTE — Telephone Encounter (Signed)
Scheduled appt per 8/1 sch msg. Called pt, no answer and no vm available. Will try to call pt again tomorrow.

## 2021-03-11 ENCOUNTER — Emergency Department (HOSPITAL_COMMUNITY): Payer: Commercial Managed Care - PPO

## 2021-03-11 ENCOUNTER — Telehealth (HOSPITAL_COMMUNITY): Payer: Self-pay

## 2021-03-11 ENCOUNTER — Inpatient Hospital Stay (HOSPITAL_COMMUNITY)
Admission: EM | Admit: 2021-03-11 | Discharge: 2021-03-14 | DRG: 392 | Disposition: A | Discharge status: Home / Self Care | Payer: Commercial Managed Care - PPO | Attending: Internal Medicine | Admitting: Internal Medicine

## 2021-03-11 ENCOUNTER — Telehealth (INDEPENDENT_AMBULATORY_CARE_PROVIDER_SITE_OTHER): Payer: Self-pay | Admitting: Internal Medicine

## 2021-03-11 ENCOUNTER — Encounter (HOSPITAL_COMMUNITY): Payer: Self-pay

## 2021-03-11 ENCOUNTER — Telehealth: Payer: Self-pay | Admitting: Hematology and Oncology

## 2021-03-11 ENCOUNTER — Other Ambulatory Visit: Payer: Self-pay

## 2021-03-11 DIAGNOSIS — R61 Generalized hyperhidrosis: Secondary | ICD-10-CM | POA: Diagnosis present

## 2021-03-11 DIAGNOSIS — D649 Anemia, unspecified: Secondary | ICD-10-CM | POA: Diagnosis present

## 2021-03-11 DIAGNOSIS — E871 Hypo-osmolality and hyponatremia: Secondary | ICD-10-CM

## 2021-03-11 DIAGNOSIS — R531 Weakness: Secondary | ICD-10-CM | POA: Diagnosis present

## 2021-03-11 DIAGNOSIS — E1165 Type 2 diabetes mellitus with hyperglycemia: Secondary | ICD-10-CM

## 2021-03-11 DIAGNOSIS — Z8249 Family history of ischemic heart disease and other diseases of the circulatory system: Secondary | ICD-10-CM

## 2021-03-11 DIAGNOSIS — K5732 Diverticulitis of large intestine without perforation or abscess without bleeding: Secondary | ICD-10-CM | POA: Diagnosis present

## 2021-03-11 DIAGNOSIS — K828 Other specified diseases of gallbladder: Secondary | ICD-10-CM | POA: Diagnosis present

## 2021-03-11 DIAGNOSIS — K810 Acute cholecystitis: Secondary | ICD-10-CM

## 2021-03-11 DIAGNOSIS — R509 Fever, unspecified: Secondary | ICD-10-CM | POA: Diagnosis present

## 2021-03-11 DIAGNOSIS — K529 Noninfective gastroenteritis and colitis, unspecified: Principal | ICD-10-CM | POA: Diagnosis present

## 2021-03-11 DIAGNOSIS — C221 Intrahepatic bile duct carcinoma: Secondary | ICD-10-CM | POA: Diagnosis present

## 2021-03-11 DIAGNOSIS — K219 Gastro-esophageal reflux disease without esophagitis: Secondary | ICD-10-CM

## 2021-03-11 DIAGNOSIS — K5792 Diverticulitis of intestine, part unspecified, without perforation or abscess without bleeding: Secondary | ICD-10-CM

## 2021-03-11 DIAGNOSIS — Z20822 Contact with and (suspected) exposure to covid-19: Secondary | ICD-10-CM | POA: Diagnosis present

## 2021-03-11 DIAGNOSIS — K8001 Calculus of gallbladder with acute cholecystitis with obstruction: Secondary | ICD-10-CM | POA: Diagnosis present

## 2021-03-11 DIAGNOSIS — Z79899 Other long term (current) drug therapy: Secondary | ICD-10-CM

## 2021-03-11 DIAGNOSIS — Z6831 Body mass index (BMI) 31.0-31.9, adult: Secondary | ICD-10-CM

## 2021-03-11 DIAGNOSIS — F17210 Nicotine dependence, cigarettes, uncomplicated: Secondary | ICD-10-CM | POA: Diagnosis present

## 2021-03-11 DIAGNOSIS — K819 Cholecystitis, unspecified: Secondary | ICD-10-CM | POA: Diagnosis present

## 2021-03-11 DIAGNOSIS — E6609 Other obesity due to excess calories: Secondary | ICD-10-CM | POA: Diagnosis present

## 2021-03-11 DIAGNOSIS — I10 Essential (primary) hypertension: Secondary | ICD-10-CM | POA: Diagnosis present

## 2021-03-11 DIAGNOSIS — R7401 Elevation of levels of liver transaminase levels: Secondary | ICD-10-CM

## 2021-03-11 DIAGNOSIS — E785 Hyperlipidemia, unspecified: Secondary | ICD-10-CM | POA: Diagnosis present

## 2021-03-11 DIAGNOSIS — I2510 Atherosclerotic heart disease of native coronary artery without angina pectoris: Secondary | ICD-10-CM | POA: Diagnosis present

## 2021-03-11 DIAGNOSIS — E861 Hypovolemia: Secondary | ICD-10-CM | POA: Diagnosis present

## 2021-03-11 DIAGNOSIS — E46 Unspecified protein-calorie malnutrition: Secondary | ICD-10-CM | POA: Diagnosis present

## 2021-03-11 DIAGNOSIS — K8309 Other cholangitis: Secondary | ICD-10-CM

## 2021-03-11 DIAGNOSIS — E1151 Type 2 diabetes mellitus with diabetic peripheral angiopathy without gangrene: Secondary | ICD-10-CM | POA: Diagnosis present

## 2021-03-11 DIAGNOSIS — K429 Umbilical hernia without obstruction or gangrene: Secondary | ICD-10-CM | POA: Diagnosis present

## 2021-03-11 DIAGNOSIS — G8929 Other chronic pain: Secondary | ICD-10-CM | POA: Diagnosis present

## 2021-03-11 DIAGNOSIS — D72829 Elevated white blood cell count, unspecified: Secondary | ICD-10-CM

## 2021-03-11 DIAGNOSIS — Z7984 Long term (current) use of oral hypoglycemic drugs: Secondary | ICD-10-CM

## 2021-03-11 DIAGNOSIS — E8809 Other disorders of plasma-protein metabolism, not elsewhere classified: Secondary | ICD-10-CM

## 2021-03-11 LAB — COMPREHENSIVE METABOLIC PANEL
ALT: 105 U/L — ABNORMAL HIGH (ref 0–44)
AST: 54 U/L — ABNORMAL HIGH (ref 15–41)
Albumin: 2.8 g/dL — ABNORMAL LOW (ref 3.5–5.0)
Alkaline Phosphatase: 209 U/L — ABNORMAL HIGH (ref 38–126)
Anion gap: 8 (ref 5–15)
BUN: 18 mg/dL (ref 8–23)
CO2: 25 mmol/L (ref 22–32)
Calcium: 8.9 mg/dL (ref 8.9–10.3)
Chloride: 97 mmol/L — ABNORMAL LOW (ref 98–111)
Creatinine, Ser: 1.01 mg/dL (ref 0.61–1.24)
GFR, Estimated: >60 mL/min (ref >60)
Glucose, Bld: 144 mg/dL — ABNORMAL HIGH (ref 70–99)
Potassium: 3.8 mmol/L (ref 3.5–5.1)
Sodium: 130 mmol/L — ABNORMAL LOW (ref 135–145)
Total Bilirubin: 4.8 mg/dL — ABNORMAL HIGH (ref 0.3–1.2)
Total Protein: 6.5 g/dL (ref 6.5–8.1)

## 2021-03-11 LAB — CBC WITH DIFFERENTIAL/PLATELET
Abs Immature Granulocytes: 0.07 10*3/uL (ref 0.00–0.07)
Basophils Absolute: 0 10*3/uL (ref 0.0–0.1)
Basophils Relative: 0 %
Eosinophils Absolute: 0.2 10*3/uL (ref 0.0–0.5)
Eosinophils Relative: 2 %
HCT: 29.9 % — ABNORMAL LOW (ref 39.0–52.0)
Hemoglobin: 10.4 g/dL — ABNORMAL LOW (ref 13.0–17.0)
Immature Granulocytes: 1 %
Lymphocytes Relative: 15 %
Lymphs Abs: 1.6 10*3/uL (ref 0.7–4.0)
MCH: 32.5 pg (ref 26.0–34.0)
MCHC: 34.8 g/dL (ref 30.0–36.0)
MCV: 93.4 fL (ref 80.0–100.0)
Monocytes Absolute: 1.5 10*3/uL — ABNORMAL HIGH (ref 0.1–1.0)
Monocytes Relative: 14 %
Neutro Abs: 7.5 10*3/uL (ref 1.7–7.7)
Neutrophils Relative %: 68 %
Platelets: 191 10*3/uL (ref 150–400)
RBC: 3.2 MIL/uL — ABNORMAL LOW (ref 4.22–5.81)
RDW: 13.3 % (ref 11.5–15.5)
WBC: 11 10*3/uL — ABNORMAL HIGH (ref 4.0–10.5)
nRBC: 0 % (ref 0.0–0.2)

## 2021-03-11 LAB — LACTIC ACID, PLASMA
Lactic Acid, Venous: 1 mmol/L (ref 0.5–1.9)
Lactic Acid, Venous: 0.9 mmol/L (ref 0.5–1.9)

## 2021-03-11 MED ORDER — PIPERACILLIN-TAZOBACTAM 3.375 G IVPB
3.3750 g | Freq: Three times a day (TID) | INTRAVENOUS | Status: DC
  Administered 2021-03-12 – 2021-03-14 (×7): 3.375 g via INTRAVENOUS
  Filled 2021-03-11 (×8): qty 50

## 2021-03-11 MED ORDER — SODIUM CHLORIDE 0.9 % IV BOLUS
1000.0000 mL | Freq: Once | INTRAVENOUS | Status: AC
  Administered 2021-03-11: 1000 mL via INTRAVENOUS

## 2021-03-11 MED ORDER — PIPERACILLIN-TAZOBACTAM 3.375 G IVPB 30 MIN
3.3750 g | Freq: Once | INTRAVENOUS | Status: AC
  Administered 2021-03-12: 3.375 g via INTRAVENOUS
  Filled 2021-03-11: qty 50

## 2021-03-11 NOTE — ED Triage Notes (Signed)
Pov from home with cc of fever and chills the last couple days. Had ERCP with Dr Laural Golden on 7/21--- following up on 8/4. States that the last  couple days he has been having a fever mainly at night and has been getting chills. Takes tylenol and it brings it down. 98.8 in triage. Denies any of the symptoms now. They recommended him come here.  Has liver cancer.

## 2021-03-11 NOTE — Telephone Encounter (Signed)
Scheduled appt per 8/1 sch msg. Spoke to pt who said that he would call back to sch if he felt it was necessary.

## 2021-03-11 NOTE — Telephone Encounter (Signed)
Spouse left voice mail stating she wanted to let Dr Laural Golden know patient had a low grade fever and some night sweats last night - she gave him tylenol -

## 2021-03-11 NOTE — Progress Notes (Signed)
Pharmacy Antibiotic Note  Jonathan Keith is a 65 y.o. male admitted on 03/11/2021 with  ?intra-abdominal infection .  Pharmacy has been consulted for Zosyn dosing. WBC mildly elevated. Renal function ok. Recent ERCP. Tachycardic. Reported fever at home, Tmax here is 99.1.   Plan: Zosyn 3.375G IV q8h to be infused over 4 hours Trend WBC, temp, renal function  F/U infectious work-up   Height: '5\' 11"'$  (180.3 cm) Weight: 86.8 kg (191 lb 6.4 oz) IBW/kg (Calculated) : 75.3  Temp (24hrs), Avg:98.7 F (37.1 C), Min:98.1 F (36.7 C), Max:99.1 F (37.3 C)  Recent Labs  Lab 03/07/21 1458 03/11/21 1936 03/11/21 2133  WBC 9.3 11.0*  --   CREATININE 0.82 1.01  --   LATICACIDVEN  --  1.0 0.9    Estimated Creatinine Clearance: 77.7 mL/min (by C-G formula based on SCr of 1.01 mg/dL).    No Known Allergies  Narda Bonds, PharmD, BCPS Clinical Pharmacist Phone: [phone]

## 2021-03-11 NOTE — ED Provider Notes (Signed)
[facility] EMERGENCY DEPARTMENT Provider Note   CSN: [id number] Arrival date & time: 03/11/21  [place]     History Chief Complaint  Patient presents with   Fever    Jonathan Keith is a 65 y.o. male.    Patient with a history of recently diagnosed cholangiocarcinoma here with a 3-day history of generalized weakness, chills, diaphoresis and fever at night.  States during the day he feels okay but for the past 3 nights he has had generalized weakness with chills, fever, sweating and fever at home up to 101.3 tonight.  He was recently discharged from the hospital after presenting with obstructive jaundice, he had biliary stent placed in the right side, patient was unable to have left hepatic duct stent placement, completely occluded and pathology suggesting adenocarcinoma. He called both his oncologist and his GI doctor was told to come to the hospital with these recurrent episodes.  States no fevers during the day.  He has a cough which is not significantly changed.  He is a smoker.  Is nonproductive.  No chest pain or shortness of breath.  No vomiting or diarrhea.  No pain with urination or blood in the urine. Denies any significant abdominal pain. States fever improved with Tylenol at home. He was discharged with several days of Augmentin for cholangitis prophylaxis which she has finished. Currently not receiving any chemotherapy. States he has a decent appetite and has not had any vomiting or diarrhea.  The history is provided by the patient and the spouse.  Fever Associated symptoms: chills   Associated symptoms: no chest pain, no congestion, no diarrhea, no dysuria, no headaches, no myalgias, no rash, no rhinorrhea and no vomiting       Past Medical History:  Diagnosis Date   Chronic pain    neck, back, knees   Class 1 obesity due to excess calories with body mass index (BMI) of 31.0 to 31.9 in adult    Claudication [facility])    DDD (degenerative disc disease), cervical    Diabetes  mellitus ([place])    Dyslipidemia    Hypertension    Tobacco dependence     Patient Active Problem List   Diagnosis Date Noted   Cholangiocarcinoma ([place])    Elevated LFTs    Choledocholithiasis    Obstructive jaundice 02/24/2021   Class 1 obesity due to excess calories with body mass index (BMI) of 31.0 to 31.9 in adult 02/24/2021   Diabetes mellitus ([place]) 02/24/2021   Dyslipidemia 02/24/2021   Hypertension 02/24/2021   Tobacco dependence 02/24/2021    Past Surgical History:  Procedure Laterality Date   BILIARY BRUSHING N/A 02/25/2021   Procedure: BILIARY BRUSHING;  Surgeon: Rogene Houston, MD;  Location: [facility];  Service: Gastroenterology;  Laterality: N/A;   BILIARY STENT PLACEMENT N/A 02/25/2021   Procedure: BILIARY STENT PLACEMENT;  Surgeon: Rogene Houston, MD;  Location: [facility];  Service: Gastroenterology;  Laterality: N/A;   BILIARY STENT PLACEMENT  02/27/2021   Procedure: BILIARY STENT PLACEMENT (10FR x 9cm) IN THE RIGHT SYSTEM;  Surgeon: Rogene Houston, MD;  Location: [facility];  Service: Gastroenterology;;   BREAST SURGERY Left    benign lump- in his 48s.   ERCP N/A 02/25/2021   Procedure: ENDOSCOPIC RETROGRADE CHOLANGIOPANCREATOGRAPHY (ERCP);  Surgeon: Rogene Houston, MD;  Location: [facility];  Service: Gastroenterology;  Laterality: N/A;   ERCP N/A 02/27/2021   Procedure: ENDOSCOPIC RETROGRADE CHOLANGIOPANCREATOGRAPHY (ERCP);  Surgeon: Rogene Houston, MD;  Location: [facility];  Service: Gastroenterology;  Laterality: N/A;   KNEE SURGERY Left    x2   SPHINCTEROTOMY N/A 02/25/2021   Procedure: SPHINCTEROTOMY;  Surgeon: Rogene Houston, MD;  Location: [facility];  Service: Gastroenterology;  Laterality: N/A;   STENT REMOVAL  02/27/2021   Procedure: STENT REMOVAL (8.5Fr x 9cm);  Surgeon: Rogene Houston, MD;  Location: [facility];  Service: Gastroenterology;;       Family History  Problem Relation Age of Onset   CAD Mother    Cancer Neg Hx     Social History   Tobacco Use    Smoking status: Every Day    Packs/day: 0.50    Years: 56.00    Pack years: 28.00    Types: Cigarettes   Smokeless tobacco: Never  Vaping Use   Vaping Use: Never used  Substance Use Topics   Alcohol use: Not Currently    Comment: stopped 2.5 years ago 03/11/21   Drug use: Never    Home Medications Prior to Admission medications   Medication Sig Start Date End Date Taking? Authorizing Provider  amoxicillin-clavulanate (AUGMENTIN) 875-125 MG tablet Take 1 tablet by mouth every 12 (twelve) hours. 02/28/21   Barton Dubois, MD  Calcium Carb-Cholecalciferol (SUPER CALCIUM 600 + D 400 PO) Take by mouth.    [provider]  metFORMIN (GLUCOPHAGE-XR) 500 MG 24 hr tablet Take 500 mg by mouth daily. 06/16/20   [provider]  Omega-3 Fatty Acids (FISH OIL) 1000 MG CPDR Take 2 g by mouth in the morning and at bedtime.    [provider]  ondansetron (ZOFRAN ODT) 8 MG disintegrating tablet Take 1 tablet (8 mg total) by mouth every 8 (eight) hours as needed for nausea or vomiting. Patient not taking: Reported on 03/07/2021 02/28/21   Barton Dubois, MD  oxyCODONE (OXY IR/ROXICODONE) 5 MG immediate release tablet Take 1 tablet (5 mg total) by mouth every 4 (four) hours as needed for severe pain. Patient not taking: Reported on 03/07/2021 02/28/21   Barton Dubois, MD  pantoprazole (PROTONIX) 40 MG tablet Take 1 tablet (40 mg total) by mouth daily. 02/28/21 02/28/22  Barton Dubois, MD    Allergies    Patient has no known allergies.  Review of Systems   Review of Systems  Constitutional:  Positive for activity change, appetite change, chills, diaphoresis, fatigue and fever.  HENT:  Negative for congestion and rhinorrhea.   Respiratory:  Negative for chest tightness.   Cardiovascular:  Negative for chest pain.  Gastrointestinal:  Positive for abdominal pain. Negative for diarrhea and vomiting.  Genitourinary:  Negative for dysuria and hematuria.  Musculoskeletal:  Negative  for arthralgias and myalgias.  Skin:  Negative for rash.  Neurological:  Positive for weakness. Negative for dizziness and headaches.   all other systems are negative except as noted in the HPI and PMH.    Physical Exam Updated Vital Signs BP (!) 154/81   Pulse (!) 108   Temp 99.1 F (37.3 C) (Oral)   Resp 20   Ht '5\' 11"'$  (1.803 m)   Wt 86.8 kg   SpO2 98%   BMI 26.69 kg/m   Physical Exam Vitals and nursing note reviewed.  Constitutional:      General: He is not in acute distress.    Appearance: He is well-developed.     Comments: Jaundice, scleral icterus  HENT:     Head: Normocephalic and atraumatic.     Mouth/Throat:     Pharynx: No oropharyngeal exudate.  Eyes:     Conjunctiva/sclera: Conjunctivae normal.     Pupils: Pupils are equal, round, and reactive to light.  Neck:     Comments: No meningismus. Cardiovascular:     Rate and Rhythm: Normal rate and regular rhythm.     Heart sounds: Normal heart sounds. No murmur heard. Pulmonary:     Effort: Pulmonary effort is normal. No respiratory distress.     Breath sounds: Normal breath sounds.  Abdominal:     General: There is distension.     Palpations: Abdomen is soft.     Tenderness: There is no abdominal tenderness. There is no guarding or rebound.  Musculoskeletal:        General: No tenderness. Normal range of motion.     Cervical back: Normal range of motion and neck supple.  Skin:    General: Skin is warm.     Coloration: Skin is jaundiced.  Neurological:     Mental Status: He is alert and oriented to person, place, and time.     Cranial Nerves: No cranial nerve deficit.     Motor: No abnormal muscle tone.     Coordination: Coordination normal.     Comments:  5/5 strength throughout. CN 2-12 intact.Equal grip strength.   Psychiatric:        Behavior: Behavior normal.    ED Results / Procedures / Treatments   Labs (all labs ordered are listed, but only abnormal results are displayed) Labs Reviewed   COMPREHENSIVE METABOLIC PANEL - Abnormal; Notable for the following components:      Result Value   Sodium 130 (*)    Chloride 97 (*)    Glucose, Bld 144 (*)    Albumin 2.8 (*)    AST 54 (*)    ALT 105 (*)    Alkaline Phosphatase 209 (*)    Total Bilirubin 4.8 (*)    All other components within normal limits  CBC WITH DIFFERENTIAL/PLATELET - Abnormal; Notable for the following components:   WBC 11.0 (*)    RBC 3.20 (*)    Hemoglobin 10.4 (*)    HCT 29.9 (*)    Monocytes Absolute 1.5 (*)    All other components within normal limits  CULTURE, BLOOD (ROUTINE X 2)  CULTURE, BLOOD (ROUTINE X 2)  RESP PANEL BY RT-PCR (FLU A&B, COVID) ARPGX2  LACTIC ACID, PLASMA  LACTIC ACID, PLASMA  URINALYSIS, ROUTINE W REFLEX MICROSCOPIC    EKG None  Radiology DG Chest 2 View  Result Date: 03/11/2021 CLINICAL DATA:  Fever and chills for several days EXAM: CHEST - 2 VIEW COMPARISON:  11/22/2014 FINDINGS: Frontal and lateral views of the chest demonstrate an unremarkable cardiac silhouette. No airspace disease, effusion, or pneumothorax. There are no acute bony abnormalities. IMPRESSION: 1. No acute intrathoracic process. Electronically Signed   By: Randa Ngo M.D.   On: 03/11/2021 19:36   CT ABDOMEN PELVIS W CONTRAST  Result Date: 03/12/2021 CLINICAL DATA:  Fever and chills. EXAM: CT ABDOMEN AND PELVIS WITH CONTRAST TECHNIQUE: Multidetector CT imaging of the abdomen and pelvis was performed using the standard protocol following bolus administration of intravenous contrast. CONTRAST:  192m OMNIPAQUE IOHEXOL 300 MG/ML  SOLN COMPARISON:  February 24, 2021 FINDINGS: Lower chest: No acute abnormality. Hepatobiliary: No focal liver abnormality is seen. There is stable moderate severity intrahepatic and extra hepatic biliary dilatation. Mild pneumobilia is noted which represents a new finding. A common bile duct stent is in place. This represents a new finding when compared  to the prior study. No gallstones  are seen within a moderately distended gallbladder lumen. Stable focal gallbladder wall thickening is noted. Pancreas: Unremarkable. No pancreatic ductal dilatation or surrounding inflammatory changes. Spleen: Normal in size without focal abnormality. Adrenals/Urinary Tract: Adrenal glands are unremarkable. Kidneys are normal, without renal calculi, focal lesion, or hydronephrosis. Bladder is unremarkable. Stomach/Bowel: Stomach is within normal limits. Appendix appears normal. No evidence of dilatation. Multiple diverticula are seen throughout the sigmoid colon. Mild thickening of the proximal to mid sigmoid colon is also noted. Vascular/Lymphatic: Aortic atherosclerosis. No enlarged abdominal or pelvic lymph nodes. Reproductive: The prostate gland is mildly enlarged. Other: A 1.9 cm x 1.6 cm fat containing umbilical hernia is noted. No abdominopelvic ascites. Musculoskeletal: Multilevel degenerative changes are seen throughout the lumbar spine. IMPRESSION: 1. Interval common bile duct stent placement since the prior study, with stable intrahepatic and extrahepatic biliary dilatation. 2. Persistent moderate severity gallbladder distension with stable focal gallbladder wall thickening. Focal cholecystitis versus neoplasm cannot be excluded. 3. Findings suggestive of mild colitis versus diverticulitis within the proximal to mid sigmoid colon. Electronically Signed   By: Virgina Norfolk M.D.   On: 03/12/2021 00:46    Procedures Procedures   Medications Ordered in ED Medications - No data to display  ED Course  I have reviewed the triage vital signs and the nursing notes.  Pertinent labs & imaging results that were available during my care of the patient were reviewed by me and considered in my medical decision making (see chart for details).    MDM Rules/Calculators/A&P                          Patient with cholangiocarcinoma and obstructive jaundice with recent stent placement here with 3 days of  fever at night with chills, diaphoresis and weakness.  Afebrile well currently.  No significant leukocytosis.  LFTs appear to be stable.  Concern for possible cholangitis.  Discussed with Dr. Jenetta Downer of gastroenterology.  He agrees with initiation of antibiotics after cultures are obtained.  Recommends CT scan to assess for patency of the biliary stents. They will consult in the morning.   LFTs are stable, bilirubin is trending down.  Patient given IV Zosyn after cultures were obtained. Did spike temperature here to 100.6  CT scan with stable common bile duct stent placement.  Stable gallbladder inflammation Possible colitis or diverticulitis of sigmoid.  Vitals remained stable.  Plan admission for IV antibiotics and IV fluids.  GI consult in the morning with possible surgery consult as well for his gallbladder though this appears to be unchanged.  Admission discussed with Dr. Josephine Cables.   Jonathan Keith was evaluated in Emergency Department on 03/11/2021 for the symptoms described in the history of present illness. He was evaluated in the context of the global COVID-19 pandemic, which necessitated consideration that the patient might be at risk for infection with the SARS-CoV-2 virus that causes COVID-19. Institutional protocols and algorithms that pertain to the evaluation of patients at risk for COVID-19 are in a state of rapid change based on information released by regulatory bodies including the CDC and federal and state organizations. These policies and algorithms were followed during the patient's care in the ED.  Final Clinical Impression(s) / ED Diagnoses Final diagnoses:  Cholangitis  Fever, unspecified fever cause    Rx / DC Orders ED Discharge Orders     None        Woodruff Skirvin, Annie Main, MD 03/12/21  0214  

## 2021-03-11 NOTE — Telephone Encounter (Signed)
Patients wife called stating patient was having night sweats, fevers, and chills. The wife also stated that there was a change in patients medication for diabetes. Physician has patient taking Metformin 2 times daily instead of just a.m. Relayed message to Dr. Raliegh Ip and recommended that patient check blood sugars at night and offer a bed time snack to ensure sugars didn't drop throughout the night. Also, if the patient's symptoms continue at night to go to ED to be evaluated. Patients wife agreed and understood directions.

## 2021-03-11 NOTE — Telephone Encounter (Signed)
Pt had ERCP on 7/21 and has a follow up with Dr. Laural Golden on 8/4. Wife states for past two nights he has had fever 101 and gave tylenol and it does bring it down to 98. States he has chills and sweats and soaks the bed. She had to change bed twice last night. Saw pcp yesterday and told about the sweats and fever. Called oncologist and was told the sweats may be from sugar dropping but that would not cause fever. Trying to get blood sugar monitor to check sugar today from pcp. Does not currently have one. States it only happens at night and states today he says he feels great and wanted to go mow the yard but oncologist said no he should not be mowing.

## 2021-03-12 ENCOUNTER — Telehealth (INDEPENDENT_AMBULATORY_CARE_PROVIDER_SITE_OTHER): Payer: Self-pay | Admitting: *Deleted

## 2021-03-12 ENCOUNTER — Inpatient Hospital Stay (HOSPITAL_COMMUNITY): Payer: Commercial Managed Care - PPO

## 2021-03-12 DIAGNOSIS — R531 Weakness: Secondary | ICD-10-CM | POA: Diagnosis present

## 2021-03-12 DIAGNOSIS — E46 Unspecified protein-calorie malnutrition: Secondary | ICD-10-CM

## 2021-03-12 DIAGNOSIS — Z20822 Contact with and (suspected) exposure to covid-19: Secondary | ICD-10-CM | POA: Diagnosis present

## 2021-03-12 DIAGNOSIS — K810 Acute cholecystitis: Secondary | ICD-10-CM

## 2021-03-12 DIAGNOSIS — F17210 Nicotine dependence, cigarettes, uncomplicated: Secondary | ICD-10-CM | POA: Diagnosis present

## 2021-03-12 DIAGNOSIS — E1165 Type 2 diabetes mellitus with hyperglycemia: Secondary | ICD-10-CM

## 2021-03-12 DIAGNOSIS — R509 Fever, unspecified: Secondary | ICD-10-CM | POA: Diagnosis present

## 2021-03-12 DIAGNOSIS — K219 Gastro-esophageal reflux disease without esophagitis: Secondary | ICD-10-CM | POA: Diagnosis present

## 2021-03-12 DIAGNOSIS — E1151 Type 2 diabetes mellitus with diabetic peripheral angiopathy without gangrene: Secondary | ICD-10-CM | POA: Diagnosis present

## 2021-03-12 DIAGNOSIS — G8929 Other chronic pain: Secondary | ICD-10-CM | POA: Diagnosis present

## 2021-03-12 DIAGNOSIS — E871 Hypo-osmolality and hyponatremia: Secondary | ICD-10-CM

## 2021-03-12 DIAGNOSIS — Z79899 Other long term (current) drug therapy: Secondary | ICD-10-CM | POA: Diagnosis not present

## 2021-03-12 DIAGNOSIS — D72829 Elevated white blood cell count, unspecified: Secondary | ICD-10-CM

## 2021-03-12 DIAGNOSIS — C221 Intrahepatic bile duct carcinoma: Secondary | ICD-10-CM

## 2021-03-12 DIAGNOSIS — K819 Cholecystitis, unspecified: Secondary | ICD-10-CM | POA: Diagnosis present

## 2021-03-12 DIAGNOSIS — Z6831 Body mass index (BMI) 31.0-31.9, adult: Secondary | ICD-10-CM | POA: Diagnosis not present

## 2021-03-12 DIAGNOSIS — E119 Type 2 diabetes mellitus without complications: Secondary | ICD-10-CM

## 2021-03-12 DIAGNOSIS — Z8249 Family history of ischemic heart disease and other diseases of the circulatory system: Secondary | ICD-10-CM | POA: Diagnosis not present

## 2021-03-12 DIAGNOSIS — K529 Noninfective gastroenteritis and colitis, unspecified: Secondary | ICD-10-CM | POA: Diagnosis present

## 2021-03-12 DIAGNOSIS — K8309 Other cholangitis: Secondary | ICD-10-CM | POA: Diagnosis present

## 2021-03-12 DIAGNOSIS — R61 Generalized hyperhidrosis: Secondary | ICD-10-CM | POA: Diagnosis present

## 2021-03-12 DIAGNOSIS — K5792 Diverticulitis of intestine, part unspecified, without perforation or abscess without bleeding: Secondary | ICD-10-CM | POA: Diagnosis not present

## 2021-03-12 DIAGNOSIS — E785 Hyperlipidemia, unspecified: Secondary | ICD-10-CM | POA: Diagnosis present

## 2021-03-12 DIAGNOSIS — I10 Essential (primary) hypertension: Secondary | ICD-10-CM | POA: Diagnosis present

## 2021-03-12 DIAGNOSIS — K8001 Calculus of gallbladder with acute cholecystitis with obstruction: Secondary | ICD-10-CM | POA: Diagnosis present

## 2021-03-12 DIAGNOSIS — R7401 Elevation of levels of liver transaminase levels: Secondary | ICD-10-CM

## 2021-03-12 DIAGNOSIS — E6609 Other obesity due to excess calories: Secondary | ICD-10-CM | POA: Diagnosis present

## 2021-03-12 DIAGNOSIS — E8809 Other disorders of plasma-protein metabolism, not elsewhere classified: Secondary | ICD-10-CM

## 2021-03-12 DIAGNOSIS — D649 Anemia, unspecified: Secondary | ICD-10-CM | POA: Diagnosis present

## 2021-03-12 DIAGNOSIS — K5732 Diverticulitis of large intestine without perforation or abscess without bleeding: Secondary | ICD-10-CM | POA: Diagnosis present

## 2021-03-12 LAB — COMPREHENSIVE METABOLIC PANEL
ALT: 82 U/L — ABNORMAL HIGH (ref 0–44)
AST: 41 U/L (ref 15–41)
Albumin: 2.4 g/dL — ABNORMAL LOW (ref 3.5–5.0)
Alkaline Phosphatase: 190 U/L — ABNORMAL HIGH (ref 38–126)
Anion gap: 7 (ref 5–15)
BUN: 17 mg/dL (ref 8–23)
CO2: 21 mmol/L — ABNORMAL LOW (ref 22–32)
Calcium: 8 mg/dL — ABNORMAL LOW (ref 8.9–10.3)
Chloride: 101 mmol/L (ref 98–111)
Creatinine, Ser: 0.81 mg/dL (ref 0.61–1.24)
GFR, Estimated: >60 mL/min (ref >60)
Glucose, Bld: 144 mg/dL — ABNORMAL HIGH (ref 70–99)
Potassium: 3.5 mmol/L (ref 3.5–5.1)
Sodium: 129 mmol/L — ABNORMAL LOW (ref 135–145)
Total Bilirubin: 4.7 mg/dL — ABNORMAL HIGH (ref 0.3–1.2)
Total Protein: 5.7 g/dL — ABNORMAL LOW (ref 6.5–8.1)

## 2021-03-12 LAB — PROTIME-INR
INR: 1.2 (ref 0.8–1.2)
Prothrombin Time: 14.7 seconds (ref 11.4–15.2)

## 2021-03-12 LAB — CBC
HCT: 24.9 % — ABNORMAL LOW (ref 39.0–52.0)
Hemoglobin: 8.9 g/dL — ABNORMAL LOW (ref 13.0–17.0)
MCH: 33.2 pg (ref 26.0–34.0)
MCHC: 35.7 g/dL (ref 30.0–36.0)
MCV: 92.9 fL (ref 80.0–100.0)
Platelets: 172 10*3/uL (ref 150–400)
RBC: 2.68 MIL/uL — ABNORMAL LOW (ref 4.22–5.81)
RDW: 13.7 % (ref 11.5–15.5)
WBC: 9.2 10*3/uL (ref 4.0–10.5)
nRBC: 0 % (ref 0.0–0.2)

## 2021-03-12 LAB — URINALYSIS, ROUTINE W REFLEX MICROSCOPIC
Bilirubin Urine: NEGATIVE
Glucose, UA: NEGATIVE mg/dL
Hgb urine dipstick: NEGATIVE
Ketones, ur: 5 mg/dL — AB
Leukocytes,Ua: NEGATIVE
Nitrite: NEGATIVE
Protein, ur: NEGATIVE mg/dL
Specific Gravity, Urine: 1.025 (ref 1.005–1.030)
pH: 5 (ref 5.0–8.0)

## 2021-03-12 LAB — MAGNESIUM: Magnesium: 1.9 mg/dL (ref 1.7–2.4)

## 2021-03-12 LAB — RESP PANEL BY RT-PCR (FLU A&B, COVID) ARPGX2
Influenza A by PCR: NEGATIVE
Influenza B by PCR: NEGATIVE
SARS Coronavirus 2 by RT PCR: NEGATIVE

## 2021-03-12 LAB — GLUCOSE, CAPILLARY
Glucose-Capillary: 149 mg/dL — ABNORMAL HIGH (ref 70–99)
Glucose-Capillary: 129 mg/dL — ABNORMAL HIGH (ref 70–99)

## 2021-03-12 LAB — PROCALCITONIN: Procalcitonin: 2.99 ng/mL

## 2021-03-12 LAB — APTT: aPTT: 33 seconds (ref 24–36)

## 2021-03-12 LAB — PHOSPHORUS: Phosphorus: 2.9 mg/dL (ref 2.5–4.6)

## 2021-03-12 MED ORDER — PANTOPRAZOLE SODIUM 40 MG IV SOLR
40.0000 mg | Freq: Every day | INTRAVENOUS | Status: DC
  Administered 2021-03-12 – 2021-03-14 (×3): 40 mg via INTRAVENOUS
  Filled 2021-03-12 (×4): qty 40

## 2021-03-12 MED ORDER — IOHEXOL 300 MG/ML  SOLN
75.0000 mL | Freq: Once | INTRAMUSCULAR | Status: AC | PRN
  Administered 2021-03-12: 75 mL via INTRAVENOUS

## 2021-03-12 MED ORDER — SODIUM CHLORIDE 0.9 % IV SOLN: Freq: Once | INTRAVENOUS | Status: AC

## 2021-03-12 MED ORDER — IOHEXOL 300 MG/ML  SOLN
100.0000 mL | Freq: Once | INTRAMUSCULAR | Status: AC | PRN
  Administered 2021-03-12: 100 mL via INTRAVENOUS

## 2021-03-12 MED ORDER — SODIUM CHLORIDE 0.9 % IV SOLN: INTRAVENOUS | Status: DC

## 2021-03-12 NOTE — Telephone Encounter (Signed)
Spoke with Dr. Laural Golden yesterday afternoon about this pt and he states he spoke with pt since this call was taken and advised ER if having fevers.

## 2021-03-12 NOTE — Telephone Encounter (Signed)
Pt wife Thayer Headings states a lady from [place] states she needs to know when Dr. Laural Golden thinks pt can return to work so she can fill out temp disability form. Pt is currently in the hospital.   [phone]

## 2021-03-12 NOTE — Plan of Care (Signed)
  Problem: Education: Goal: Knowledge of General Education information will improve Description: Including pain rating scale, medication(s)/side effects and non-pharmacologic comfort measures Outcome: Progressing   Problem: Clinical Measurements: Goal: Respiratory complications will improve Outcome: Not Applicable Goal: Cardiovascular complication will be avoided Outcome: Not Applicable   Problem: Activity: Goal: Risk for activity intolerance will decrease Outcome: Progressing   Problem: Coping: Goal: Level of anxiety will decrease Outcome: Not Applicable

## 2021-03-12 NOTE — H&P (Signed)
History and Physical  Jonathan Keith [id number] DOB: [birth year] DOA: 03/11/2021  Referring physician: Ezequiel Essex, MD PCP: [name], PA-C  Patient coming from: Home  Chief Complaint: Fever  HPI: Jonathan Keith is a 65 y.o. male with medical history significant for cholangiocarcinoma, type II DM, GERD who presents to the emergency department due to 3-day onset of fever, diaphoresis and chills at night.  Patient states that he usually feels okay during the day, he reported a fever of 101.3F tonight.  Patient states that he called his gastroenterologist and oncologist and was asked to go to the ED.  He denied chest pain, shortness of breath, abdominal pain, vomiting or diarrhea.  He was recently admitted from 7/18 to 07/22 due to obstructive jaundice with elevated LFTs and hyperbilirubinemia.  During which ERCP was done with cytology brushings suggesting adenocarcinoma/cholangiocarcinoma.  ED Course:  In the emergency department, he was febrile with a temperature of 100.41F, he was tachycardic, BP 154/81, other vital signs are within normal range.  Work-up in the ED showed mild leukocytosis and normocytic anemia.  Hyponatremia and hyperglycemia.  T bili 4.8, transaminitis, hypoalbuminemia..  Urinalysis was unimpressive for UTI, lactic acid was negative.  Influenza A, B and SARS coronavirus 2 was negative. CT abdomen and pelvis with contrast showed Interval common bile duct stent placement since the prior study, with stable intrahepatic and extrahepatic biliary dilatation. 2. Persistent moderate severity gallbladder distension with stable focal gallbladder wall thickening. Focal cholecystitis versus neoplasm cannot be excluded. 3. Findings suggestive of mild colitis versus diverticulitis within the proximal to mid sigmoid colon. Patient was empirically started on IV Zosyn, IV hydration was provided. Gastroenterology was consulted and will consult with patient in the morning.   Hospitalist was asked to admit patient for further evaluation and management.  Review of Systems: Constitutional: Positive for diaphoresis, chills and fever.  HENT: Negative for ear pain and sore throat.   Eyes: Negative for pain and visual disturbance.  Respiratory: Negative for cough, chest tightness and shortness of breath.   Cardiovascular: Negative for chest pain and palpitations.  Gastrointestinal: Negative for abdominal pain and vomiting.  Endocrine: Negative for polyphagia and polyuria.  Genitourinary: Negative for decreased urine volume, dysuria, enuresis Musculoskeletal: Negative for arthralgias and back pain.  Skin: Negative for color change and rash.  Allergic/Immunologic: Negative for immunocompromised state.  Neurological: Positive for weakness.  Negative for tremors, syncope, speech difficulty, Hematological: Does not bruise/bleed easily.  All other systems reviewed and are negative  Past Medical History:  Diagnosis Date   Chronic pain    neck, back, knees   Class 1 obesity due to excess calories with body mass index (BMI) of 31.0 to 31.9 in adult    Claudication [facility])    DDD (degenerative disc disease), cervical    Diabetes mellitus ([place])    Dyslipidemia    Hypertension    Tobacco dependence    Past Surgical History:  Procedure Laterality Date   BILIARY BRUSHING N/A 02/25/2021   Procedure: BILIARY BRUSHING;  Surgeon: Rogene Houston, MD;  Location: [facility];  Service: Gastroenterology;  Laterality: N/A;   BILIARY STENT PLACEMENT N/A 02/25/2021   Procedure: BILIARY STENT PLACEMENT;  Surgeon: Rogene Houston, MD;  Location: [facility];  Service: Gastroenterology;  Laterality: N/A;   BILIARY STENT PLACEMENT  02/27/2021   Procedure: BILIARY STENT PLACEMENT (10FR x 9cm) IN THE RIGHT SYSTEM;  Surgeon: Rogene Houston, MD;  Location: [facility];  Service: Gastroenterology;;   BREAST SURGERY  Left    benign lump- in his 46s.   ERCP N/A 02/25/2021   Procedure: ENDOSCOPIC  RETROGRADE CHOLANGIOPANCREATOGRAPHY (ERCP);  Surgeon: Rogene Houston, MD;  Location: [facility];  Service: Gastroenterology;  Laterality: N/A;   ERCP N/A 02/27/2021   Procedure: ENDOSCOPIC RETROGRADE CHOLANGIOPANCREATOGRAPHY (ERCP);  Surgeon: Rogene Houston, MD;  Location: [facility];  Service: Gastroenterology;  Laterality: N/A;   KNEE SURGERY Left    x2   SPHINCTEROTOMY N/A 02/25/2021   Procedure: SPHINCTEROTOMY;  Surgeon: Rogene Houston, MD;  Location: [facility];  Service: Gastroenterology;  Laterality: N/A;   STENT REMOVAL  02/27/2021   Procedure: STENT REMOVAL (8.5Fr x 9cm);  Surgeon: Rogene Houston, MD;  Location: [facility];  Service: Gastroenterology;;    Social History:  reports that he has been smoking cigarettes. He has a 28.00 pack-year smoking history. He has never used smokeless tobacco. He reports previous alcohol use. He reports that he does not use drugs.   No Known Allergies  Family History  Problem Relation Age of Onset   CAD Mother    Cancer Neg Hx      Prior to Admission medications   Medication Sig Start Date End Date Taking? Authorizing Provider  amoxicillin-clavulanate (AUGMENTIN) 875-125 MG tablet Take 1 tablet by mouth every 12 (twelve) hours. 02/28/21   Barton Dubois, MD  Calcium Carb-Cholecalciferol (SUPER CALCIUM 600 + D 400 PO) Take by mouth.    [provider]  metFORMIN (GLUCOPHAGE-XR) 500 MG 24 hr tablet Take 500 mg by mouth daily. 06/16/20   [provider]  Omega-3 Fatty Acids (FISH OIL) 1000 MG CPDR Take 2 g by mouth in the morning and at bedtime.    [provider]  ondansetron (ZOFRAN ODT) 8 MG disintegrating tablet Take 1 tablet (8 mg total) by mouth every 8 (eight) hours as needed for nausea or vomiting. Patient not taking: Reported on 03/07/2021 02/28/21   Barton Dubois, MD  oxyCODONE (OXY IR/ROXICODONE) 5 MG immediate release tablet Take 1 tablet (5 mg total) by mouth every 4 (four) hours as needed for severe pain. Patient not  taking: Reported on 03/07/2021 02/28/21   Barton Dubois, MD  pantoprazole (PROTONIX) 40 MG tablet Take 1 tablet (40 mg total) by mouth daily. 02/28/21 02/28/22  Barton Dubois, MD    Physical Exam: BP 120/62 (BP Location: Left Arm)   Pulse (!) 104   Temp 100 F (37.8 C) (Oral)   Resp 18   Ht '5\' 11"'$  (1.803 m)   Wt 84.7 kg   SpO2 96%   BMI 26.04 kg/m   General: 65 y.o. year-old male well developed well nourished in no acute distress.  Alert and oriented x3. HEENT: NCAT, scleral icterus, jaundice Neck: Supple, trachea medial Cardiovascular: Regular rate and rhythm with no rubs or gallops.  No thyromegaly or JVD noted.  No lower extremity edema. 2/4 pulses in all 4 extremities. Respiratory: Clear to auscultation with no wheezes or rales. Good inspiratory effort. Abdomen: Soft, nontender nondistended with normal bowel sounds x4 quadrants. Muskuloskeletal: No cyanosis, clubbing or edema noted bilaterally Neuro: CN II-XII intact, strength 5/5 x 4, sensation, reflexes intact Skin: Jaundiced.  No ulcerative lesions noted or rashes Psychiatry: Judgement and insight appear normal. Mood is appropriate for condition and setting          Labs on Admission:  Basic Metabolic Panel: Recent Labs  Lab 03/07/21 1458 03/11/21 1936  NA 131* 130*  K 4.4 3.8  CL 100 97*  CO2  26 25  GLUCOSE 185* 144*  BUN 16 18  CREATININE 0.82 1.01  CALCIUM 9.1 8.9   Liver Function Tests: Recent Labs  Lab 03/07/21 1458 03/11/21 1936  AST 95* 54*  ALT 171* 105*  ALKPHOS 241* 209*  BILITOT 5.2* 4.8*  PROT 6.5 6.5  ALBUMIN 3.0* 2.8*   No results for input(s): LIPASE, AMYLASE in the last 168 hours. No results for input(s): AMMONIA in the last 168 hours. CBC: Recent Labs  Lab 03/07/21 1458 03/11/21 1936  WBC 9.3 11.0*  NEUTROABS 5.1 7.5  HGB 11.6* 10.4*  HCT 33.7* 29.9*  MCV 97.4 93.4  PLT 256 191   Cardiac Enzymes: No results for input(s): CKTOTAL, CKMB, CKMBINDEX, TROPONINI in the last 168  hours.  BNP (last 3 results) No results for input(s): BNP in the last 8760 hours.  ProBNP (last 3 results) No results for input(s): PROBNP in the last 8760 hours.  CBG: Recent Labs  Lab 03/12/21 0407  GLUCAP 149*    Radiological Exams on Admission: DG Chest 2 View  Result Date: 03/11/2021 CLINICAL DATA:  Fever and chills for several days EXAM: CHEST - 2 VIEW COMPARISON:  11/22/2014 FINDINGS: Frontal and lateral views of the chest demonstrate an unremarkable cardiac silhouette. No airspace disease, effusion, or pneumothorax. There are no acute bony abnormalities. IMPRESSION: 1. No acute intrathoracic process. Electronically Signed   By: Randa Ngo M.D.   On: 03/11/2021 19:36   CT ABDOMEN PELVIS W CONTRAST  Result Date: 03/12/2021 CLINICAL DATA:  Fever and chills. EXAM: CT ABDOMEN AND PELVIS WITH CONTRAST TECHNIQUE: Multidetector CT imaging of the abdomen and pelvis was performed using the standard protocol following bolus administration of intravenous contrast. CONTRAST:  132m OMNIPAQUE IOHEXOL 300 MG/ML  SOLN COMPARISON:  February 24, 2021 FINDINGS: Lower chest: No acute abnormality. Hepatobiliary: No focal liver abnormality is seen. There is stable moderate severity intrahepatic and extra hepatic biliary dilatation. Mild pneumobilia is noted which represents a new finding. A common bile duct stent is in place. This represents a new finding when compared to the prior study. No gallstones are seen within a moderately distended gallbladder lumen. Stable focal gallbladder wall thickening is noted. Pancreas: Unremarkable. No pancreatic ductal dilatation or surrounding inflammatory changes. Spleen: Normal in size without focal abnormality. Adrenals/Urinary Tract: Adrenal glands are unremarkable. Kidneys are normal, without renal calculi, focal lesion, or hydronephrosis. Bladder is unremarkable. Stomach/Bowel: Stomach is within normal limits. Appendix appears normal. No evidence of dilatation.  Multiple diverticula are seen throughout the sigmoid colon. Mild thickening of the proximal to mid sigmoid colon is also noted. Vascular/Lymphatic: Aortic atherosclerosis. No enlarged abdominal or pelvic lymph nodes. Reproductive: The prostate gland is mildly enlarged. Other: A 1.9 cm x 1.6 cm fat containing umbilical hernia is noted. No abdominopelvic ascites. Musculoskeletal: Multilevel degenerative changes are seen throughout the lumbar spine. IMPRESSION: 1. Interval common bile duct stent placement since the prior study, with stable intrahepatic and extrahepatic biliary dilatation. 2. Persistent moderate severity gallbladder distension with stable focal gallbladder wall thickening. Focal cholecystitis versus neoplasm cannot be excluded. 3. Findings suggestive of mild colitis versus diverticulitis within the proximal to mid sigmoid colon. Electronically Signed   By: TVirgina NorfolkM.D.   On: 03/12/2021 00:46    EKG: I independently viewed the EKG done and my findings are as followed: EKG was not done in the ED  Assessment/Plan Present on Admission:  Acute febrile illness  [place]  Principal Problem:   Acute febrile illness Active Problems:  Cholangiocarcinoma ([place])   Acute cholecystitis   Diverticulitis   Transaminitis   Leukocytosis   Hyponatremia   Hyperglycemia due to diabetes mellitus ([facility])   GERD (gastroesophageal reflux disease)   Hypoalbuminemia due to protein-calorie malnutrition ([facility])  Acute febrile illness secondary to multifactorial including focal cholecystitis vs neoplasm vs diverticulitis CT abdomen and pelvis was suspicious for focal cholecystitis vs neoplasm, mild colitis or diverticulitis Patient was empirically started on IV Zosyn, we shall continue with same at this time Procalcitonin will be checked, blood culture pending Patient states that his fever was only nocturnal and that he usually feels okay during the day  Leukocytosis possibly  reactive vs infectious Continue treatment as described above  Cholangiocarcinoma CT abdomen and pelvis showed Interval common bile duct stent placement since the prior study, with stable intrahepatic and extrahepatic biliary dilatation Patient has not started chemotherapy Gastroenterology was consulted by ED physician and will see patient in the morning  Transaminitis possibly due to patient's history of obstructive jaundice possibly due to cholangiocarcinoma AST 54, ALT 105, ALP 209 (these have trended down from 2 weeks ago) Continue to monitor liver enzymes  Elevated total bilirubin Total bilirubin 4.8, this has trended down from 20.9 on 02/26/2021  Pulm anemia possibly secondary to moderate protein calorie malnutrition Albumin 2.8, consider protein supplement when patient resumes oral intake  Hyponatremia Na 130, IV hydration provided Continue IV hydration  Hyperglycemia secondary to T2DM Continue ISS and hypoglycemia protocol  GERD Continue Protonix   DVT prophylaxis: SCDs (consider starting chemoprophylaxis if no intervention by gastroenterology)  Code Status: Full code  Family Communication: None at bedside  Disposition Plan:  Patient is from:                        home Anticipated DC to:                   SNF or family members home Anticipated DC date:               2-3 days Anticipated DC barriers:          Patient requires inpatient management due to acute febrile illness and cholangiocarcinoma pending gastroenterology consult  Consults called: Gastroenterology  Admission status: Inpatient  Bernadette Hoit MD [facility]  03/12/2021, 5:46 AM

## 2021-03-12 NOTE — Consult Note (Signed)
Referring Provider: No ref. provider found Primary Care Physician:  Mann, Benjamin L, PA-C Primary Gastroenterologist:  Dr.Rehman  Date of Admission: 03/11/21 Date of Consultation: 03/12/21  Reason for Consultation:  Cholangiocarcinoma/diverticulitis/colitis  HPI:  Jonathan Keith is a 65 y.o. year old male with PMG significant for recent diagnosis of cholangiocarcinoma, Type II DM, and GERD, who presented to ED yesterday for fever, diaphoresis and chills x3 days. Patient reported fever as high as 101.3 at home. Was advised by his gastroenterologist and oncologist to proceed to ED, given his symptoms. Patient denied chest pain, shob, abd pain, vomiting or diarrhea. He was recently admitted from 7/18-7/22 for obstructive jaundice with elevated LFTs and hyperbilirubinemia. ERCP was performed at that time with cytology brushing that suggested adenocarcinoma/cholangiocarcinoma.   ED Course: patient was febrile in ED with temp of 100.6, tachycardic without hypotension. Mild leukocytosis (11) and normocytic anemia (hgb 10.4) as well as hyponatremia (130) and hyperglycemia (144). T bili 4.8 (also trending down from previous admission), transaminitis ( AST 54, ALT 105, trending down from previous hospital admission), elevated alk phos (209, trending down from previous admission) and hypoalbuminemia (2.8 remains steady between 2.5-3 over last 2 weeks). Lactic acid WNL.   CT abdomen/pelvis with contrast showed interval CBD stent placement with stable intrahepatic and extrahepatic biliary dilatation. Persistent moderate severity gallbladder distention w/stable focal gallbladder wall thickening (consistent with previous imaging in July). Mild colitis vs diverticulitis within proximal to mid sigmoid colon.   IV rehydration and empiric zosyn started in ED.  GI consulted for cholangiocarcinoma/diverticulitis/colitis  Consult:  Patient provided history with wife and daughter at bedside. [facility] evaluation of surgical removal of cholangiocarcinoma that was recently discovered after he was admitted to the hospital on 7/18. Patient came into ED on previous admission with c/o jaundice/weakness/fatigue and found to have elevated LFTs secondary to intra and extrehepatic biliary dilation, suspected to be related to choledocolithiasis. On further evaluation via MRCP, no choledocolithiasis were present, however, intrahepatic ductal dilatation was noted as was lesion that was concerning for cholangiocarcinoma. There was also concern for gallbladder neoplasm that time given adjacent wall thickening/narrowing of mid gallbladder, however, it was suspected this was likely related to inflammation and less likely malignancy.  Patient underwent biliary sphincterotomy and placement of 8.5 French 9cm long biliary stent for decompression. Brushing cytology obtained confirming cholangiocarcinoma.   Patient reports that he began feeling poorly 4 days ago, endorses fevers (mostly at night) as well as chills and diaphoresis. Reports that he felt fine during the day and continued to eat as he normally does. He denies nausea, vomiting, abdominal pain or constipation. He reports that there was some diarrhea, this subsided on 8/1. He denies melena or BRBPR. Patient Spoke with Dr. Rehman yesterday, regarding fever and it was recommended that he proceed to the ED for further evaluation due to concern for cholangitis given recent history.  He denies abdominal pain, nausea vomiting or diarrhea today. LFTs appear to be trending down since admission in July with biliary stent placement. He States that he had a normal bowel movement this morning. He is hungry and was asking when he would be able to eat.    Past Medical History:  Diagnosis Date   Chronic pain    neck, back, knees   Class 1 obesity due to excess calories with body mass index (BMI) of 31.0 to 31.9 in adult    Claudication (HCC)    DDD  (degenerative   disc disease), cervical    Diabetes mellitus (HCC)    Dyslipidemia    Hypertension    Tobacco dependence     Past Surgical History:  Procedure Laterality Date   BILIARY BRUSHING N/A 02/25/2021   Procedure: BILIARY BRUSHING;  Surgeon: Rehman, Najeeb U, MD;  Location: [facility];  Service: Gastroenterology;  Laterality: N/A;   BILIARY STENT PLACEMENT N/A 02/25/2021   Procedure: BILIARY STENT PLACEMENT;  Surgeon: Rehman, Najeeb U, MD;  Location: [facility];  Service: Gastroenterology;  Laterality: N/A;   BILIARY STENT PLACEMENT  02/27/2021   Procedure: BILIARY STENT PLACEMENT (10FR x 9cm) IN THE RIGHT SYSTEM;  Surgeon: Rehman, Najeeb U, MD;  Location: [facility];  Service: Gastroenterology;;   BREAST SURGERY Left    benign lump- in his 20s.   ERCP N/A 02/25/2021   Procedure: ENDOSCOPIC RETROGRADE CHOLANGIOPANCREATOGRAPHY (ERCP);  Surgeon: Rehman, Najeeb U, MD;  Location: [facility];  Service: Gastroenterology;  Laterality: N/A;   ERCP N/A 02/27/2021   Procedure: ENDOSCOPIC RETROGRADE CHOLANGIOPANCREATOGRAPHY (ERCP);  Surgeon: Rehman, Najeeb U, MD;  Location: [facility];  Service: Gastroenterology;  Laterality: N/A;   KNEE SURGERY Left    x2   SPHINCTEROTOMY N/A 02/25/2021   Procedure: SPHINCTEROTOMY;  Surgeon: Rehman, Najeeb U, MD;  Location: [facility];  Service: Gastroenterology;  Laterality: N/A;   STENT REMOVAL  02/27/2021   Procedure: STENT REMOVAL (8.5Fr x 9cm);  Surgeon: Rehman, Najeeb U, MD;  Location: [facility];  Service: Gastroenterology;;    Prior to Admission medications   Medication Sig Start Date End Date Taking? Authorizing Provider  amoxicillin-clavulanate (AUGMENTIN) 875-125 MG tablet Take 1 tablet by mouth every 12 (twelve) hours. 02/28/21   Madera, Carlos, MD  Calcium Carb-Cholecalciferol (SUPER CALCIUM 600 + D 400 PO) Take by mouth.    [provider]  metFORMIN (GLUCOPHAGE-XR) 500 MG 24 hr tablet Take 500 mg by mouth daily. 06/16/20   [provider]  Omega-3 Fatty  Acids (FISH OIL) 1000 MG CPDR Take 2 g by mouth in the morning and at bedtime.    [provider]  ondansetron (ZOFRAN ODT) 8 MG disintegrating tablet Take 1 tablet (8 mg total) by mouth every 8 (eight) hours as needed for nausea or vomiting. Patient not taking: Reported on 03/07/2021 02/28/21   Madera, Carlos, MD  oxyCODONE (OXY IR/ROXICODONE) 5 MG immediate release tablet Take 1 tablet (5 mg total) by mouth every 4 (four) hours as needed for severe pain. Patient not taking: Reported on 03/07/2021 02/28/21   Madera, Carlos, MD  pantoprazole (PROTONIX) 40 MG tablet Take 1 tablet (40 mg total) by mouth daily. 02/28/21 02/28/22  Madera, Carlos, MD    Current Facility-Administered Medications  Medication Dose Route Frequency Provider Last Rate Last Admin   pantoprazole (PROTONIX) injection 40 mg  40 mg Intravenous Daily Adefeso, Oladapo, DO   40 mg at 03/12/21 0600   piperacillin-tazobactam (ZOSYN) IVPB 3.375 g  3.375 g Intravenous Q8H Adefeso, Oladapo, DO 12.5 mL/hr at 03/12/21 0531 3.375 g at 03/12/21 0531    Allergies as of 03/11/2021   (No Known Allergies)    Family History  Problem Relation Age of Onset   CAD Mother    Cancer Neg Hx     Social History   Socioeconomic History   Marital status: Married    Spouse name: Not on file   Number of children: Not on file   Years of education: Not on file   Highest education level: Not on file  Occupational History     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  Tobacco Use   Smoking status: Every Day    Packs/day: 0.50    Years: 56.00    Pack years: 28.00    Types: Cigarettes   Smokeless tobacco: Never  Vaping Use   Vaping Use: Never used  Substance and Sexual Activity   Alcohol use: Not Currently    Comment: stopped 2.5 years ago 03/11/21   Drug use: Never   Sexual activity: Not on file  Other Topics Concern   Not on file  Social History Narrative   Not on file   Social Determinants of Health   Financial Resource Strain:  Medium Risk   Difficulty of Paying Living Expenses: Somewhat hard  Food Insecurity: No Food Insecurity   Worried About Charity fundraiser in the Last Year: Never true   Ran Out of Food in the Last Year: Never true  Transportation Needs: No Transportation Needs   Lack of Transportation (Medical): No   Lack of Transportation (Non-Medical): No  Physical Activity: Insufficiently Active   Days of Exercise per Week: 2 days   Minutes of Exercise per Session: 20 min  Stress: No Stress Concern Present   Feeling of Stress : Only a little  Social Connections: Moderately Integrated   Frequency of Communication with Friends and Family: More than three times a week   Frequency of Social Gatherings with Friends and Family: More than three times a week   Attends Religious Services: More than 4 times per year   Active Member of [employer] or Organizations: No   Attends Music therapist: Never   Marital Status: Married  Human resources officer Violence: Not At Risk   Fear of Current or Ex-Partner: No   Emotionally Abused: No   Physically Abused: No   Sexually Abused: No    Review of Systems: Gen: Denies loss of appetite, change in weight or weight loss, endorses fevers, chills CV: Denies chest pain, heart palpitations, syncope, edema  Resp: Denies shortness of breath with rest, cough, wheezing GI: Denies dysphagia or odynophagia. Denies vomiting blood, jaundice, and fecal incontinence.  GU : Denies urinary burning, urinary frequency, urinary incontinence.  MS: Denies joint pain,swelling, cramping Derm: Denies rash, itching, dry skin Psych: Denies depression, anxiety,confusion, or memory loss Heme: Denies bruising, bleeding, and enlarged lymph nodes.  Physical Exam: Vital signs in last 24 hours: Temp:  [98.1 F (36.7 C)-100.6 F (38.1 C)] 100 F (37.8 C) (08/03 0400) Pulse Rate:  [104-111] 104 (08/03 0415) Resp:  [15-22] 18 (08/03 0400) BP: (117-164)/(58-86) 120/62 (08/03 0400) SpO2:   [94 %-98 %] 96 % (08/03 0400) Weight:  [84.7 kg-86.8 kg] 84.7 kg (08/03 0400) Last BM Date: 03/11/21 General:   Alert,  Well-developed, well-nourished, pleasant and cooperative in NAD Head:  Normocephalic and atraumatic. Eyes:  icterus present Ears:  Normal auditory acuity. Lungs:  Clear throughout to auscultation.   No wheezes, crackles, or rhonchi. No acute distress. Heart:  Regular rate and rhythm; no murmurs, clicks, rubs,  or gallops. Abdomen:  Soft, nontender and nondistended. No masses, hepatosplenomegaly or hernias noted. Normal bowel sounds, without guarding, and without rebound.   Rectal:  Deferred until time of colonoscopy.   Msk:  Symmetrical without gross deformities. Normal posture. Pulses:  Normal pulses noted. Extremities:  Without clubbing or edema. Neurologic:  Alert and  oriented x4;  grossly normal neurologically. Skin:  Intact without significant lesions or rashes, mild jaundice present Psych:  Alert and cooperative. Normal mood and affect.  Intake/Output  from previous day: 08/02 0701 - 08/03 0700 In: 1050 [IV Piggyback:1050] Out: -    Lab Results: Recent Labs    03/11/21 1936 03/12/21 0518  WBC 11.0* 9.2  HGB 10.4* 8.9*  HCT 29.9* 24.9*  PLT 191 172   BMET Recent Labs    03/11/21 1936 03/12/21 0518  NA 130* 129*  K 3.8 3.5  CL 97* 101  CO2 25 21*  GLUCOSE 144* 144*  BUN 18 17  CREATININE 1.01 0.81  CALCIUM 8.9 8.0*   LFT Recent Labs    03/11/21 1936 03/12/21 0518  PROT 6.5 5.7*  ALBUMIN 2.8* 2.4*  AST 54* 41  ALT 105* 82*  ALKPHOS 209* 190*  BILITOT 4.8* 4.7*   PT/INR Recent Labs    03/12/21 0518  LABPROT 14.7  INR 1.2   Hepatitis Panel No results for input(s): HEPBSAG, HCVAB, HEPAIGM, HEPBIGM in the last 72 hours. C-Diff No results for input(s): CDIFFTOX in the last 72 hours.  Studies/Results: DG Chest 2 View  Result Date: 03/11/2021 CLINICAL DATA:  Fever and chills for several days EXAM: CHEST - 2 VIEW COMPARISON:   11/22/2014 FINDINGS: Frontal and lateral views of the chest demonstrate an unremarkable cardiac silhouette. No airspace disease, effusion, or pneumothorax. There are no acute bony abnormalities. IMPRESSION: 1. No acute intrathoracic process. Electronically Signed   By: Randa Ngo M.D.   On: 03/11/2021 19:36   CT ABDOMEN PELVIS W CONTRAST  Result Date: 03/12/2021 CLINICAL DATA:  Fever and chills. EXAM: CT ABDOMEN AND PELVIS WITH CONTRAST TECHNIQUE: Multidetector CT imaging of the abdomen and pelvis was performed using the standard protocol following bolus administration of intravenous contrast. CONTRAST:  161m OMNIPAQUE IOHEXOL 300 MG/ML  SOLN COMPARISON:  February 24, 2021 FINDINGS: Lower chest: No acute abnormality. Hepatobiliary: No focal liver abnormality is seen. There is stable moderate severity intrahepatic and extra hepatic biliary dilatation. Mild pneumobilia is noted which represents a new finding. A common bile duct stent is in place. This represents a new finding when compared to the prior study. No gallstones are seen within a moderately distended gallbladder lumen. Stable focal gallbladder wall thickening is noted. Pancreas: Unremarkable. No pancreatic ductal dilatation or surrounding inflammatory changes. Spleen: Normal in size without focal abnormality. Adrenals/Urinary Tract: Adrenal glands are unremarkable. Kidneys are normal, without renal calculi, focal lesion, or hydronephrosis. Bladder is unremarkable. Stomach/Bowel: Stomach is within normal limits. Appendix appears normal. No evidence of dilatation. Multiple diverticula are seen throughout the sigmoid colon. Mild thickening of the proximal to mid sigmoid colon is also noted. Vascular/Lymphatic: Aortic atherosclerosis. No enlarged abdominal or pelvic lymph nodes. Reproductive: The prostate gland is mildly enlarged. Other: A 1.9 cm x 1.6 cm fat containing umbilical hernia is noted. No abdominopelvic ascites. Musculoskeletal: Multilevel  degenerative changes are seen throughout the lumbar spine. IMPRESSION: 1. Interval common bile duct stent placement since the prior study, with stable intrahepatic and extrahepatic biliary dilatation. 2. Persistent moderate severity gallbladder distension with stable focal gallbladder wall thickening. Focal cholecystitis versus neoplasm cannot be excluded. 3. Findings suggestive of mild colitis versus diverticulitis within the proximal to mid sigmoid colon. Electronically Signed   By: TVirgina NorfolkM.D.   On: 03/12/2021 00:46    Impression: RJUWANN SHERKis a 65year old male with recent diagnosis of cholangiocarcinoma and recent CBD stenting who presented to ED on 8/2 with fever, chills and diaphoresis and diarrhea. Concerns for cholangitis given recent biliary stent placement/cholangiocarcinoma, incidental findings of diverticulitis vs colitis on CT  abd pelvis done in ED.  CT abd/pelvis revealed CBD stent placement that appears stable as does inflammation of gallbladder as compared to previous CT on last admission in July, however, newly noted pneumobilia is now present. T bili 4.8 (also trending down from previous admission), transaminitis ( AST 54, ALT 105, trending down from previous hospital admission), elevated alk phos (209, trending down from previous admission) and hypoalbuminemia (2.8). INR remains WNL at 1.2. mild icterus/jaundice present. Previously mild leukocytosis that has normalized to 9.2 this morning. Pt with only mildly increased temperature this morning (highest 100.3). Given rapid down trending of LFTs, fever likely related to presence of cholangiocarcinoma, however, cholangitis cannot be completely ruled out.   Diverticulitis vs Colitis: patient started on empiric zosyn in ED. Previous CT in July showed diverticulosis with possible previous episodes of diverticulitis, however, patient reports he is unaware of any previous episodes. He denies abdominal pain, nausea, vomiting or  diarrhea at this time. He is inquiring about when he will be able to eat as he is hungry. It would be appropriate to advance patient's diet as tolerated  Hemoglobin 8.9, trending down over the past 2 weeks. Likely multifactorial in setting of iv rehydration and cholangiocarcinoma. no Melena or Hematochezia reported. Should continue to trend H&H to monitor for need of transfusion.   Plan: Continue IV zosyn  Will advance diet at this time Pain medicine and anti emetics as needed per hospitalist orders Continue to trend H&H, transfuse as needed Continue to trend LFTs    LOS: 0 days    03/12/2021, 9:16 AM  Chelsea L. Carlan, MSN, APRN, AGNP-C Adult-Gerontology Nurse Practitioner [facility]  

## 2021-03-12 NOTE — Plan of Care (Signed)
  Problem: Pain Managment: Goal: General experience of comfort will improve Outcome: Progressing   Problem: Safety: Goal: Ability to remain free from injury will improve Outcome: Progressing   

## 2021-03-12 NOTE — Progress Notes (Signed)
Received report from [place], Therapist, sports. Assumed pt care at 0700. Pt resting comfortably in bed in no acute distress. VSS. Denies pain. NPO. Call bell within reach. Will continue to monitor.

## 2021-03-12 NOTE — Progress Notes (Signed)
[facility] PROGRESS NOTE   Jonathan Keith [id number] DOB: March 05, 1956 DOA: 03/11/2021  PCP: [name], PA-C  Brief History/Interval Summary: 65 y.o. male with medical history significant for cholangiocarcinoma, type II DM, GERD who presented to the emergency department due to 3-day onset of fever, diaphoresis and chills at night.  Symptoms occur mainly in the nighttime.  Patient recently diagnosed with cholangiocarcinoma.  He is supposed to go to [facility] next week for second opinion regarding treatment options.  Patient also had a biliary stent placed during previous hospitalization for obstructive jaundice.  Patient was hospitalized for further management.    Consultants: Gastroenterology  Procedures: None  Antibiotics: Anti-infectives (From admission, onward)    Start     Dose/Rate Route Frequency Ordered Stop   03/12/21 0600  piperacillin-tazobactam (ZOSYN) IVPB 3.375 g        3.375 g 12.5 mL/hr over 240 Minutes Intravenous Every 8 hours 03/11/21 2352     03/12/21 0000  piperacillin-tazobactam (ZOSYN) IVPB 3.375 g        3.375 g 100 mL/hr over 30 Minutes Intravenous  Once 03/11/21 2352 03/12/21 0255       Subjective/Interval History: Patient mentions that he does not have any abdominal pain currently.  Denies any nausea vomiting.  No diarrhea.  Concerned about the fever that occurs almost every night.     Assessment/Plan:  Febrile illness WBC was noted to be only 11.  Procalcitonin however noted to be elevated at 2.99.  Lactic acid level was normal.  LFTs were also noted to be elevated.  But they have been trending down compared to levels from previous hospitalization.  His abdomen is benign.  CT scan did not show any abscess.  Gallbladder appeared to be similar in appearance to previous imaging studies.  They did raise concern for colitis versus diverticulitis in the sigmoid colon however patient does not have any tenderness in the lower abdomen.   Follow-up on cultures.  Continue Zosyn for now. Fever could be from his malignancy as well.  UA did not suggest any infection. He did mention a dry cough but his chest x-ray did not show any acute findings.  His influenza and COVID-19 test were negative.  Abnormal LFTs with recent obstructive jaundice Underwent biliary stent placement recently for obstructive jaundice.  His bilirubin has been improving gradually.  Have requested GI input with respect to his fevers.  Cholangiocarcinoma Seen by medical oncology recently.  Plan was to do a CT scan of his chest.  Since he is experiencing dry cough and now has a fever we will proceed with CT scan while he is here in the hospital.  Patient also plans to see providers at [facility] next week regarding treatment options.  Hyponatremia Multifactorial including malignancy as well as mild hypovolemia.  Continue to monitor.  Normocytic anemia No evidence of overt bleeding.  Continue to monitor hemoglobin.  Slight drop is noted compared to last week.  Could be dilutional.  Diabetes mellitus type 2, uncontrolled with hyperglycemia Continue SSI.  History of GERD Continue Protonix.   DVT Prophylaxis: SCDs for now Code Status: Full code Family Communication: Discussed with the patient.  Discussed with his wife Disposition Plan: Hopefully home in 24 to 48 hours  Status is: Inpatient  Remains inpatient appropriate because:Ongoing diagnostic testing needed not appropriate for outpatient work up and IV treatments appropriate due to intensity of illness or inability to take PO  Dispo: The patient is from: Home  Anticipated d/c is to: Home              Patient currently is not medically stable to d/c.   Difficult to place patient No    Medications: Scheduled:  pantoprazole (PROTONIX) IV  40 mg Intravenous Daily   Continuous:  piperacillin-tazobactam (ZOSYN)  IV 3.375 g (03/12/21 0531)   PRN:   Objective:  Vital  Signs  Vitals:   03/12/21 0300 03/12/21 0307 03/12/21 0400 03/12/21 0415  BP:   120/62   Pulse:   (!) 111 (!) 104  Resp:   18   Temp: 100.3 F (37.9 C) 100.3 F (37.9 C) 100 F (37.8 C)   TempSrc: Oral Oral Oral   SpO2:   96%   Weight:   84.7 kg   Height:   '5\' 11"'$  (1.803 m)     Intake/Output Summary (Last 24 hours) at 03/12/2021 1223 Last data filed at 03/12/2021 0255 Gross per 24 hour  Intake 1050 ml  Output --  Net 1050 ml   Filed Weights   03/11/21 1918 03/12/21 0400  Weight: 86.8 kg 84.7 kg    General appearance: Awake alert.  In no distress Resp: Clear to auscultation bilaterally.  Normal effort Cardio: S1-S2 is normal regular.  No S3-S4.  No rubs murmurs or bruit GI: Abdomen is soft.  Nontender nondistended.  Bowel sounds are present normal.  No masses organomegaly Extremities: No edema.  Full range of motion of lower extremities. Neurologic: Alert and oriented x3.  No focal neurological deficits.    Lab Results:  Data Reviewed: I have personally reviewed following labs and imaging studies  CBC: Recent Labs  Lab 03/07/21 1458 03/11/21 1936 03/12/21 0518  WBC 9.3 11.0* 9.2  NEUTROABS 5.1 7.5  --   HGB 11.6* 10.4* 8.9*  HCT 33.7* 29.9* 24.9*  MCV 97.4 93.4 92.9  PLT 256 191 [id number]    Basic Metabolic Panel: Recent Labs  Lab 03/07/21 1458 03/11/21 1936 03/12/21 0518  NA 131* 130* 129*  K 4.4 3.8 3.5  CL 100 97* 101  CO2 26 25 21*  GLUCOSE 185* 144* 144*  BUN '16 18 17  '$ CREATININE 0.82 1.01 0.81  CALCIUM 9.1 8.9 8.0*  MG  --   --  1.9  PHOS  --   --  2.9    GFR: Estimated Creatinine Clearance: 96.8 mL/min (by C-G formula based on SCr of 0.81 mg/dL).  Liver Function Tests: Recent Labs  Lab 03/07/21 1458 03/11/21 1936 03/12/21 0518  AST 95* 54* 41  ALT 171* 105* 82*  ALKPHOS 241* 209* 190*  BILITOT 5.2* 4.8* 4.7*  PROT 6.5 6.5 5.7*  ALBUMIN 3.0* 2.8* 2.4*      Coagulation Profile: Recent Labs  Lab 03/12/21 0518  INR 1.2       CBG: Recent Labs  Lab 03/12/21 0407 03/12/21 1105  GLUCAP 149* 129*     Recent Results (from the past 240 hour(s))  Resp Panel by RT-PCR (Flu A&B, Covid) Nasopharyngeal Swab     Status: None   Collection Time: 03/11/21 11:11 PM   Specimen: Nasopharyngeal Swab; Nasopharyngeal(NP) swabs in vial transport medium  Result Value Ref Range Status   SARS Coronavirus 2 by RT PCR NEGATIVE NEGATIVE Final    Comment: (NOTE) SARS-CoV-2 target nucleic acids are NOT DETECTED.  The SARS-CoV-2 RNA is generally detectable in upper respiratory specimens during the acute phase of infection. The lowest concentration of SARS-CoV-2 viral copies this assay can detect is 138 copies/mL.  A negative result does not preclude SARS-Cov-2 infection and should not be used as the sole basis for treatment or other patient management decisions. A negative result may occur with  improper specimen collection/handling, submission of specimen other than nasopharyngeal swab, presence of viral mutation(s) within the areas targeted by this assay, and inadequate number of viral copies(<138 copies/mL). A negative result must be combined with clinical observations, patient history, and epidemiological information. The expected result is Negative.  Fact Sheet for Patients:  EntrepreneurPulse.com.au  Fact Sheet for Healthcare Providers:  IncredibleEmployment.be  This test is no t yet approved or cleared by the [place] FDA and  has been authorized for detection and/or diagnosis of SARS-CoV-2 by FDA under an Emergency Use Authorization (EUA). This EUA will remain  in effect (meaning this test can be used) for the duration of the COVID-19 declaration under Section 564(b)(1) of the Act, 21 U.S.C.section 360bbb-3(b)(1), unless the authorization is terminated  or revoked sooner.       Influenza A by PCR NEGATIVE NEGATIVE Final   Influenza B by PCR NEGATIVE NEGATIVE Final     Comment: (NOTE) The Xpert Xpress SARS-CoV-2/FLU/RSV plus assay is intended as an aid in the diagnosis of influenza from Nasopharyngeal swab specimens and should not be used as a sole basis for treatment. Nasal washings and aspirates are unacceptable for Xpert Xpress SARS-CoV-2/FLU/RSV testing.  Fact Sheet for Patients: EntrepreneurPulse.com.au  Fact Sheet for Healthcare Providers: IncredibleEmployment.be  This test is not yet approved or cleared by the [place] FDA and has been authorized for detection and/or diagnosis of SARS-CoV-2 by FDA under an Emergency Use Authorization (EUA). This EUA will remain in effect (meaning this test can be used) for the duration of the COVID-19 declaration under Section 564(b)(1) of the Act, 21 U.S.C. section 360bbb-3(b)(1), unless the authorization is terminated or revoked.  Performed at [facility], [address] [place], [place] [address]   Blood culture (routine x 2)     Status: None (Preliminary result)   Collection Time: 03/11/21 11:15 PM   Specimen: Right Antecubital; Blood  Result Value Ref Range Status   Specimen Description RIGHT ANTECUBITAL  Final   Special Requests   Final    BOTTLES DRAWN AEROBIC AND ANAEROBIC Blood Culture adequate volume   Culture   Final    NO GROWTH < 12 HOURS Performed at [facility], [address], [place], [place] [address]    Report Status PENDING  Incomplete  Blood culture (routine x 2)     Status: None (Preliminary result)   Collection Time: 03/11/21 11:19 PM   Specimen: Left Antecubital; Blood  Result Value Ref Range Status   Specimen Description LEFT ANTECUBITAL  Final   Special Requests   Final    BOTTLES DRAWN AEROBIC AND ANAEROBIC Blood Culture adequate volume   Culture   Final    NO GROWTH < 12 HOURS Performed at [facility], [address], [place], [place] [address]    Report Status PENDING  Incomplete      Radiology Studies: DG Chest 2  View  Result Date: 03/11/2021 CLINICAL DATA:  Fever and chills for several days EXAM: CHEST - 2 VIEW COMPARISON:  11/22/2014 FINDINGS: Frontal and lateral views of the chest demonstrate an unremarkable cardiac silhouette. No airspace disease, effusion, or pneumothorax. There are no acute bony abnormalities. IMPRESSION: 1. No acute intrathoracic process. Electronically Signed   By: Randa Ngo M.D.   On: 03/11/2021 19:36   CT ABDOMEN PELVIS W CONTRAST  Result Date: 03/12/2021 CLINICAL DATA:  Fever and chills. EXAM: CT ABDOMEN AND PELVIS WITH CONTRAST TECHNIQUE: Multidetector CT imaging of the abdomen and pelvis was performed using the standard protocol following bolus administration of intravenous contrast. CONTRAST:  139m OMNIPAQUE IOHEXOL 300 MG/ML  SOLN COMPARISON:  February 24, 2021 FINDINGS: Lower chest: No acute abnormality. Hepatobiliary: No focal liver abnormality is seen. There is stable moderate severity intrahepatic and extra hepatic biliary dilatation. Mild pneumobilia is noted which represents a new finding. A common bile duct stent is in place. This represents a new finding when compared to the prior study. No gallstones are seen within a moderately distended gallbladder lumen. Stable focal gallbladder wall thickening is noted. Pancreas: Unremarkable. No pancreatic ductal dilatation or surrounding inflammatory changes. Spleen: Normal in size without focal abnormality. Adrenals/Urinary Tract: Adrenal glands are unremarkable. Kidneys are normal, without renal calculi, focal lesion, or hydronephrosis. Bladder is unremarkable. Stomach/Bowel: Stomach is within normal limits. Appendix appears normal. No evidence of dilatation. Multiple diverticula are seen throughout the sigmoid colon. Mild thickening of the proximal to mid sigmoid colon is also noted. Vascular/Lymphatic: Aortic atherosclerosis. No enlarged abdominal or pelvic lymph nodes. Reproductive: The prostate gland is mildly enlarged. Other: A 1.9  cm x 1.6 cm fat containing umbilical hernia is noted. No abdominopelvic ascites. Musculoskeletal: Multilevel degenerative changes are seen throughout the lumbar spine. IMPRESSION: 1. Interval common bile duct stent placement since the prior study, with stable intrahepatic and extrahepatic biliary dilatation. 2. Persistent moderate severity gallbladder distension with stable focal gallbladder wall thickening. Focal cholecystitis versus neoplasm cannot be excluded. 3. Findings suggestive of mild colitis versus diverticulitis within the proximal to mid sigmoid colon. Electronically Signed   By: TVirgina NorfolkM.D.   On: 03/12/2021 00:46       LOS: 0 days   [facility] Pager on [email or web address]  03/12/2021, 12:23 PM

## 2021-03-13 ENCOUNTER — Ambulatory Visit (INDEPENDENT_AMBULATORY_CARE_PROVIDER_SITE_OTHER): Payer: Commercial Managed Care - PPO | Admitting: Internal Medicine

## 2021-03-13 DIAGNOSIS — R509 Fever, unspecified: Secondary | ICD-10-CM

## 2021-03-13 LAB — CBC WITH DIFFERENTIAL/PLATELET
Abs Immature Granulocytes: 0.05 10*3/uL (ref 0.00–0.07)
Basophils Absolute: 0 10*3/uL (ref 0.0–0.1)
Basophils Relative: 0 %
Eosinophils Absolute: 0.3 10*3/uL (ref 0.0–0.5)
Eosinophils Relative: 4 %
HCT: 24.7 % — ABNORMAL LOW (ref 39.0–52.0)
Hemoglobin: 8.5 g/dL — ABNORMAL LOW (ref 13.0–17.0)
Immature Granulocytes: 1 %
Lymphocytes Relative: 17 %
Lymphs Abs: 1.3 10*3/uL (ref 0.7–4.0)
MCH: 32.8 pg (ref 26.0–34.0)
MCHC: 34.4 g/dL (ref 30.0–36.0)
MCV: 95.4 fL (ref 80.0–100.0)
Monocytes Absolute: 1.4 10*3/uL — ABNORMAL HIGH (ref 0.1–1.0)
Monocytes Relative: 18 %
Neutro Abs: 4.9 10*3/uL (ref 1.7–7.7)
Neutrophils Relative %: 60 %
Platelets: 176 10*3/uL (ref 150–400)
RBC: 2.59 MIL/uL — ABNORMAL LOW (ref 4.22–5.81)
RDW: 13.4 % (ref 11.5–15.5)
WBC: 8 10*3/uL (ref 4.0–10.5)
nRBC: 0 % (ref 0.0–0.2)

## 2021-03-13 LAB — COMPREHENSIVE METABOLIC PANEL
ALT: 77 U/L — ABNORMAL HIGH (ref 0–44)
AST: 45 U/L — ABNORMAL HIGH (ref 15–41)
Albumin: 2.2 g/dL — ABNORMAL LOW (ref 3.5–5.0)
Alkaline Phosphatase: 190 U/L — ABNORMAL HIGH (ref 38–126)
Anion gap: 7 (ref 5–15)
BUN: 13 mg/dL (ref 8–23)
CO2: 25 mmol/L (ref 22–32)
Calcium: 8.2 mg/dL — ABNORMAL LOW (ref 8.9–10.3)
Chloride: 103 mmol/L (ref 98–111)
Creatinine, Ser: 0.79 mg/dL (ref 0.61–1.24)
GFR, Estimated: >60 mL/min (ref >60)
Glucose, Bld: 127 mg/dL — ABNORMAL HIGH (ref 70–99)
Potassium: 3.6 mmol/L (ref 3.5–5.1)
Sodium: 135 mmol/L (ref 135–145)
Total Bilirubin: 3.4 mg/dL — ABNORMAL HIGH (ref 0.3–1.2)
Total Protein: 5.5 g/dL — ABNORMAL LOW (ref 6.5–8.1)

## 2021-03-13 LAB — PROCALCITONIN: Procalcitonin: 2.36 ng/mL

## 2021-03-13 MED ORDER — POTASSIUM CHLORIDE CRYS ER 20 MEQ PO TBCR
40.0000 meq | EXTENDED_RELEASE_TABLET | Freq: Once | ORAL | Status: AC
  Administered 2021-03-13: 40 meq via ORAL
  Filled 2021-03-13: qty 2

## 2021-03-13 NOTE — Progress Notes (Addendum)
Subjective:  Patient denies abdominal pain. Eating well. Wants to go home. Has a lot to do to get ready for trip to [facility], flies out 03/19/21.   Objective: Vital signs in last 24 hours: Temp:  [98.4 F (36.9 C)-100.1 F (37.8 C)] 100.1 F (37.8 C) (08/04 0511) Pulse Rate:  [88-94] 94 (08/04 0511) Resp:  [17-19] 19 (08/04 0511) BP: (109-132)/(65-71) 121/71 (08/04 0511) SpO2:  [95 %-98 %] 95 % (08/04 0511) Last BM Date: 03/13/21 General:   Alert,  jaundiced appearing male in NAD. Daughter and wife at bedside. Head:  Normocephalic and atraumatic. Eyes:  Sclera clear, no icterus.   Abdomen:  Soft, nontender and nondistended.  Normal bowel sounds, without guarding, and without rebound.   Extremities:  Without clubbing, deformity or edema. Neurologic:  Alert and  oriented x4;  grossly normal neurologically. Psych:  Alert and cooperative. Normal mood and affect.  Intake/Output from previous day: 08/03 0701 - 08/04 0700 In: 163.2 [I.V.:2.6; IV Piggyback:160.6] Out: -  Intake/Output this shift: Total I/O In: 240 [P.O.:240] Out: -   Lab Results: CBC Recent Labs    03/11/21 1936 03/12/21 0518 03/13/21 0429  WBC 11.0* 9.2 8.0  HGB 10.4* 8.9* 8.5*  HCT 29.9* 24.9* 24.7*  MCV 93.4 92.9 95.4  PLT 191 172 176   BMET Recent Labs    03/11/21 1936 03/12/21 0518 03/13/21 0429  NA 130* 129* 135  K 3.8 3.5 3.6  CL 97* 101 103  CO2 25 21* 25  GLUCOSE 144* 144* 127*  BUN '18 17 13  '$ CREATININE 1.01 0.81 0.79  CALCIUM 8.9 8.0* 8.2*   LFTs Recent Labs    03/11/21 1936 03/12/21 0518 03/13/21 0429  BILITOT 4.8* 4.7* 3.4*  ALKPHOS 209* 190* 190*  AST 54* 41 45*  ALT 105* 82* 77*  PROT 6.5 5.7* 5.5*  ALBUMIN 2.8* 2.4* 2.2*   No results for input(s): LIPASE in the last 72 hours. PT/INR Recent Labs    03/12/21 0518  LABPROT 14.7  INR 1.2      Imaging Studies: DG Chest 2 View  Result Date: 03/11/2021 CLINICAL DATA:  Fever and chills for several days EXAM: CHEST -  2 VIEW COMPARISON:  11/22/2014 FINDINGS: Frontal and lateral views of the chest demonstrate an unremarkable cardiac silhouette. No airspace disease, effusion, or pneumothorax. There are no acute bony abnormalities. IMPRESSION: 1. No acute intrathoracic process. Electronically Signed   By: Randa Ngo M.D.   On: 03/11/2021 19:36   CT CHEST W CONTRAST  Result Date: 03/12/2021 CLINICAL DATA:  Persistent cough, fever and chills for 3 days. History of gastrointestinal cancer. EXAM: CT CHEST WITH CONTRAST TECHNIQUE: Multidetector CT imaging of the chest was performed during intravenous contrast administration. CONTRAST:  21m OMNIPAQUE IOHEXOL 300 MG/ML  SOLN COMPARISON:  CT abdomen/pelvis, same date. FINDINGS: Cardiovascular: The heart is normal in size. No pericardial effusion. The aorta is normal in caliber. No dissection. Mild atherosclerotic changes. Age advanced Three-vessel coronary artery calcifications are noted. Mediastinum/Nodes: Small scattered mediastinal and hilar lymph nodes but no mass or overt adenopathy. The esophagus is grossly normal. Lungs/Pleura: No acute pulmonary findings. No worrisome pulmonary lesions or pulmonary nodules to suggest metastatic disease. Upper Abdomen: Biliary stent in place with pneumobilia and intrahepatic biliary dilatation. No upper abdominal adenopathy. Musculoskeletal: No significant bony findings. IMPRESSION: 1. No acute pulmonary findings, worrisome pulmonary lesions or pulmonary nodules to suggest metastatic disease. 2. Age advanced Three-vessel coronary artery calcifications. 3. Biliary stent in place with pneumobilia  and intrahepatic biliary dilatation. 4. Aortic atherosclerosis. Aortic Atherosclerosis (ICD10-I70.0). Electronically Signed   By: Marijo Sanes M.D.   On: 03/12/2021 17:03   CT ABDOMEN PELVIS W CONTRAST  Result Date: 03/12/2021 CLINICAL DATA:  Fever and chills. EXAM: CT ABDOMEN AND PELVIS WITH CONTRAST TECHNIQUE: Multidetector CT imaging of the  abdomen and pelvis was performed using the standard protocol following bolus administration of intravenous contrast. CONTRAST:  181m OMNIPAQUE IOHEXOL 300 MG/ML  SOLN COMPARISON:  February 24, 2021 FINDINGS: Lower chest: No acute abnormality. Hepatobiliary: No focal liver abnormality is seen. There is stable moderate severity intrahepatic and extra hepatic biliary dilatation. Mild pneumobilia is noted which represents a new finding. A common bile duct stent is in place. This represents a new finding when compared to the prior study. No gallstones are seen within a moderately distended gallbladder lumen. Stable focal gallbladder wall thickening is noted. Pancreas: Unremarkable. No pancreatic ductal dilatation or surrounding inflammatory changes. Spleen: Normal in size without focal abnormality. Adrenals/Urinary Tract: Adrenal glands are unremarkable. Kidneys are normal, without renal calculi, focal lesion, or hydronephrosis. Bladder is unremarkable. Stomach/Bowel: Stomach is within normal limits. Appendix appears normal. No evidence of dilatation. Multiple diverticula are seen throughout the sigmoid colon. Mild thickening of the proximal to mid sigmoid colon is also noted. Vascular/Lymphatic: Aortic atherosclerosis. No enlarged abdominal or pelvic lymph nodes. Reproductive: The prostate gland is mildly enlarged. Other: A 1.9 cm x 1.6 cm fat containing umbilical hernia is noted. No abdominopelvic ascites. Musculoskeletal: Multilevel degenerative changes are seen throughout the lumbar spine. IMPRESSION: 1. Interval common bile duct stent placement since the prior study, with stable intrahepatic and extrahepatic biliary dilatation. 2. Persistent moderate severity gallbladder distension with stable focal gallbladder wall thickening. Focal cholecystitis versus neoplasm cannot be excluded. 3. Findings suggestive of mild colitis versus diverticulitis within the proximal to mid sigmoid colon. Electronically Signed   By:  TVirgina NorfolkM.D.   On: 03/12/2021 00:46   CT ABDOMEN PELVIS W CONTRAST  Result Date: 02/24/2021 CLINICAL DATA:  Abdominal distension, weakness, jaundice EXAM: CT ABDOMEN AND PELVIS WITH CONTRAST TECHNIQUE: Multidetector CT imaging of the abdomen and pelvis was performed using the standard protocol following bolus administration of intravenous contrast. CONTRAST:  1068mOMNIPAQUE IOHEXOL 300 MG/ML  SOLN COMPARISON:  None. FINDINGS: Lower chest: No acute pleural or parenchymal lung disease. Minimal bilateral lower lobe bronchiectasis. Hepatobiliary: There is marked intrahepatic and extrahepatic biliary duct dilation, with common bile duct measuring up to 10 mm in size. There are multiple rounded areas of high attenuation within the common hepatic duct and common bile duct, reference images 22, 23, 26. Largest area measures 6 mm. The gallbladder is moderately distended. Circumferential wall thickening and enhancement is seen within the mid aspect of the gallbladder. This irregular area of gallbladder wall thickening measures up to 7 mm. Differential includes localized inflammatory change versus neoplasm. Pancreas: The pancreas enhances normally. Mild fatty atrophy of the pancreatic body and tail. No surrounding inflammatory changes. Spleen: Normal in size without focal abnormality. Adrenals/Urinary Tract: Adrenal glands are unremarkable. Kidneys are normal, without renal calculi, focal lesion, or hydronephrosis. Bladder is unremarkable. Stomach/Bowel: No bowel obstruction or ileus. Normal appendix right lower quadrant. Scattered diverticulosis throughout the colon. There is nonspecific wall thickening of the mid sigmoid colon without surrounding inflammation. This may reflect wall thickening due to under distension versus scarring from previous bouts of diverticulitis. Vascular/Lymphatic: Aortic atherosclerosis. No enlarged abdominal or pelvic lymph nodes. Reproductive: Prostate is unremarkable. Other: No  free fluid  or free gas. Small fat containing umbilical hernia. No bowel herniation. Musculoskeletal: No acute or destructive bony lesions. Reconstructed images demonstrate no additional findings. IMPRESSION: 1. Intrahepatic and extrahepatic biliary duct dilation, with multiple small high attenuation obstructing foci within the common hepatic and common bile duct as above. The appearance would favor multiple obstructing common bile duct calculi. ERCP may be useful for further evaluation and decompression of the biliary system. 2. Moderate gallbladder distension, with focal circumferential wall thickening and enhancement within the body of the gallbladder. Differential diagnosis includes focal cholecystitis versus neoplasm. If further imaging is desired, dedicated liver MRI with and without contrast could be considered. 3. Diverticulosis of the sigmoid colon without evidence of acute diverticulitis. Nonspecific sigmoid colon wall thickening most consistent with scarring from previous bouts of diverticulitis. 4.  Aortic Atherosclerosis (ICD10-I70.0). Electronically Signed   By: Randa Ngo M.D.   On: 02/24/2021 15:17   MR 3D Recon At Scanner  Result Date: 02/25/2021 CLINICAL DATA:  Jaundice, focal gallbladder wall thickening on CT EXAM: MRI ABDOMEN WITHOUT AND WITH CONTRAST (INCLUDING MRCP) TECHNIQUE: Multiplanar multisequence MR imaging of the abdomen was performed both before and after the administration of intravenous contrast. Heavily T2-weighted images of the biliary and pancreatic ducts were obtained, and three-dimensional MRCP images were rendered by post processing. CONTRAST:  54m GADAVIST GADOBUTROL 1 MMOL/ML IV SOLN COMPARISON:  CT abdomen/pelvis dated 02/24/2021 FINDINGS: Lower chest: Lung bases are clear. Hepatobiliary: Liver is within normal limits. No suspicious/enhancing hepatic lesions. However, there is associated moderate intrahepatic ductal dilatation with suspected abnormal enhancing soft  tissue at the hilar confluence measuring 12 mm (series 23/image 43), raising the concern for a small hilar cholangiocarcinoma/Klatskin tumor. Gallbladder is notable for a 3.5 cm gallstone (series 4/image 20). There is associated focal wall thickening with narrowing/stenosis (series 4/image 19) in the mid gallbladder, with enhancement (series 18/image 40) in this region and proximally along the gallstone. While neoplasm remains possible, this overall appearance favors reactive inflammation secondary to the large gallstone. Distal CBD is nondilated, measuring 6 mm. No choledocholithiasis is seen. Pancreas:  Within normal limits. Spleen:  Within normal limits. Adrenals/Urinary Tract:  Adrenal glands are within normal limits. Kidneys are within normal limits.  No hydronephrosis. Stomach/Bowel: Stomach is within normal limits. Visualized bowel is unremarkable. Vascular/Lymphatic:  No evidence of abdominal aortic aneurysm. No suspicious abdominal lymphadenopathy. Other:  No abdominal ascites. Musculoskeletal: No focal osseous lesions. IMPRESSION: Suspected 12 mm enhancing soft tissue lesion at the hilar confluence, raise concern for hilar cholangiocarcinoma/Klatskin tumor. Associated moderate intrahepatic ductal dilatation. 3.5 cm gallstone. Adjacent wall thickening/narrowing of the mid gallbladder, with surrounding enhancement, favored to reflect reactive inflammation. Gallbladder neoplasm remains possible but is considered less likely. Distal CBD is nondilated, measuring 6 mm. No choledocholithiasis is seen. Electronically Signed   By: SJulian HyM.D.   On: 02/25/2021 20:09   DG ERCP  Result Date: 02/27/2021 CLINICAL DATA:  History of cholangio carcinoma with obstructive jaundice, post ERCP. EXAM: ERCP TECHNIQUE: Multiple spot images obtained with the fluoroscopic device and submitted for interpretation post-procedure. COMPARISON:  ERCP-02/25/2021 CT abdomen pelvis-02/24/2021 MRCP-02/25/2021 FLUOROSCOPY TIME:   6 minutes, 9 seconds (141.5 mGy) FINDINGS: Multiple spot fluoroscopic images of the right upper abdominal quadrant during ERCP are provided for review. Initial image demonstrates an ERCP probe overlying the right upper abdominal quadrant. A biliary stent overlies expected location of the CBD. Subsequent images demonstrate removal of the biliary stent with subsequent cannulation and opacification of the CBD which demonstrates tapered irregularity  at its central aspect. There is moderate to marked dilatation of the opacified central aspect of the intrahepatic biliary tree. Subsequent images demonstrate apparent biliary plasty at the level the hilum. Completion images demonstrate placement of a new internal biliary stent. IMPRESSION: ERCP with biliary plasty and stent exchange above. These images were submitted for radiologic interpretation only. Please see the procedural report for the amount of contrast and the fluoroscopy time utilized. Electronically Signed   By: Sandi Mariscal M.D.   On: 02/27/2021 14:08   DG ERCP  Result Date: 02/25/2021 CLINICAL DATA:  Jaundice EXAM: ERCP TECHNIQUE: Multiple spot images obtained with the fluoroscopic device and submitted for interpretation post-procedure. COMPARISON:  CT 02/24/2021 FINDINGS: Series of fluoroscopic spot images document endoscopic cannulation and opacification of the pancreatic duct and subsequently the biliary tree. There is segmental narrowing of proximal and mid CBD on limited images. Subsequent images document placement of plastic biliary stent. The intrahepatic biliary tree is incompletely opacified, appearing dilated centrally. IMPRESSION: 1. Segmental CBD narrowing, with endoscopic stent placement. These images were submitted for radiologic interpretation only. Please see the procedural report for the amount of contrast and the fluoroscopy time utilized. Electronically Signed   By: Lucrezia Europe M.D.   On: 02/25/2021 15:02   MR ABDOMEN MRCP W WO  CONTAST  Result Date: 02/25/2021 CLINICAL DATA:  Jaundice, focal gallbladder wall thickening on CT EXAM: MRI ABDOMEN WITHOUT AND WITH CONTRAST (INCLUDING MRCP) TECHNIQUE: Multiplanar multisequence MR imaging of the abdomen was performed both before and after the administration of intravenous contrast. Heavily T2-weighted images of the biliary and pancreatic ducts were obtained, and three-dimensional MRCP images were rendered by post processing. CONTRAST:  49m GADAVIST GADOBUTROL 1 MMOL/ML IV SOLN COMPARISON:  CT abdomen/pelvis dated 02/24/2021 FINDINGS: Lower chest: Lung bases are clear. Hepatobiliary: Liver is within normal limits. No suspicious/enhancing hepatic lesions. However, there is associated moderate intrahepatic ductal dilatation with suspected abnormal enhancing soft tissue at the hilar confluence measuring 12 mm (series 23/image 43), raising the concern for a small hilar cholangiocarcinoma/Klatskin tumor. Gallbladder is notable for a 3.5 cm gallstone (series 4/image 20). There is associated focal wall thickening with narrowing/stenosis (series 4/image 19) in the mid gallbladder, with enhancement (series 18/image 40) in this region and proximally along the gallstone. While neoplasm remains possible, this overall appearance favors reactive inflammation secondary to the large gallstone. Distal CBD is nondilated, measuring 6 mm. No choledocholithiasis is seen. Pancreas:  Within normal limits. Spleen:  Within normal limits. Adrenals/Urinary Tract:  Adrenal glands are within normal limits. Kidneys are within normal limits.  No hydronephrosis. Stomach/Bowel: Stomach is within normal limits. Visualized bowel is unremarkable. Vascular/Lymphatic:  No evidence of abdominal aortic aneurysm. No suspicious abdominal lymphadenopathy. Other:  No abdominal ascites. Musculoskeletal: No focal osseous lesions. IMPRESSION: Suspected 12 mm enhancing soft tissue lesion at the hilar confluence, raise concern for hilar  cholangiocarcinoma/Klatskin tumor. Associated moderate intrahepatic ductal dilatation. 3.5 cm gallstone. Adjacent wall thickening/narrowing of the mid gallbladder, with surrounding enhancement, favored to reflect reactive inflammation. Gallbladder neoplasm remains possible but is considered less likely. Distal CBD is nondilated, measuring 6 mm. No choledocholithiasis is seen. Electronically Signed   By: SJulian HyM.D.   On: 02/25/2021 20:09  [2 weeks]   Assessment: Pleasant 65year old male with recent diagnosis of cholangiocarcinoma and recent CBD stenting who presented to the ED August 2 with fever, chills, diaphoresis and diarrhea.  Concern for cholangitis given recent biliary stent placement/cholangiocarcinoma, incidental findings of diverticulitis versus colitis on CT abdomen  pelvis done in the ED.  CT abdomen pelvis on presentation revealed CBD stent placement that appears stable as does inflammation of the gallbladder as compared to prior CT on last admission in July, however, newly noted pneumobilia present.  Findings suggestive of mild colitis versus diverticulitis within the proximal to mid sigmoid colon also noted.  LFTs noted to be trending downward since last admission with continued improvement today.  Total bilirubin down to 3.4, alkaline phosphatase 190, AST 45, ALT 77.  White blood cell count normal.  Fever may be secondary to presence of a cholangiocarcinoma however cholangitis cannot be completely ruled out.  Blood cultures remain negative at less than 24 hours.  T-max last 24 hours was 100.1. has completed 48 hours of Zosyn as of this evening.   Diverticulitis versus colitis: Patient started on empiric Zosyn in the ED given history of fever. Denies abdominal pain or diarrhea at this time. Likely insignificant finding.   Anemia: Hemoglobin 8.5 today, down from 10.4 on admission.  Hemoglobin 11.6 at time of recent discharge.  Likely multifactorial.  No overt bleeding noted.  We  will continue to follow.  Transfuse if needed.  Plan: Follow-up pending blood cultures. Continue Zosyn. Trend hemoglobin and LFTs. Given coronary artery calcification on chest CT, would likely need cardiac evaluation prior to any major surgical intervention. Patient anxious to be discharged due to trying to take care of short term disability papers and get ready for trip to [facility] [place] 03/19/21 [facility] appt).   Laureen Ochs. Bernarda Caffey [facility] [phone] 8/4/20221:25 PM    LOS: 1 day

## 2021-03-13 NOTE — Progress Notes (Signed)
[facility] PROGRESS NOTE   Jonathan Keith [id number] DOB: 04-29-1956 DOA: 03/11/2021  PCP: [name], PA-C  Brief History/Interval Summary: 65 y.o. male with medical history significant for cholangiocarcinoma, type II DM, GERD who presented to the emergency department due to 3-day onset of fever, diaphoresis and chills at night.  Symptoms occur mainly in the nighttime.  Patient recently diagnosed with cholangiocarcinoma.  He is supposed to go to [facility] next week for second opinion regarding treatment options.  Patient also had a biliary stent placed during previous hospitalization for obstructive jaundice.  Patient was hospitalized for further management.    Consultants: Gastroenterology  Procedures: None  Antibiotics: Anti-infectives (From admission, onward)    Start     Dose/Rate Route Frequency Ordered Stop   03/12/21 0600  piperacillin-tazobactam (ZOSYN) IVPB 3.375 g        3.375 g 12.5 mL/hr over 240 Minutes Intravenous Every 8 hours 03/11/21 2352     03/12/21 0000  piperacillin-tazobactam (ZOSYN) IVPB 3.375 g        3.375 g 100 mL/hr over 30 Minutes Intravenous  Once 03/11/21 2352 03/12/21 0255       Subjective/Interval History: Patient denies any abdominal pain nausea or vomiting.  He wants to go home.       Assessment/Plan:  Febrile illness/colitis versus sigmoid diverticulitis Continues to have low-grade fever.  Procalcitonin was elevated up to 2.99.  Improved to 2.36 today.  WBC is normal.  Patient remains on Zosyn.  The only potential source of infection that was found was an area of concern in the sigmoid colon concerning for colitis or diverticulitis.  Patient's abdomen remains benign.  CT abdomen did not show any abscess.  Gallbladder appeared to be similar in appearance to previous imaging studies. Patient seen by gastroenterology who also recommended continuing Zosyn.  From a symptom standpoint he is stable.  Await further GI  recommendations.  Fever could be from his malignancy as well.  UA did not suggest any infection. He did mention a dry cough but his chest x-ray did not show any acute findings.  His influenza and COVID-19 test were negative.  Abnormal LFTs with recent obstructive jaundice Underwent biliary stent placement recently for obstructive jaundice.  His bilirubin has been improving gradually.    Cholangiocarcinoma Patient plans to see providers at [facility] next week regarding treatment options. Seen by local medical oncology recently.  CT chest did not show any evidence for metastatic disease.  However calcifications were noted in the coronary vessels.  Per gastroenterology patient may need cardiac work-up prior to his cancer surgery.  Will defer this to gastroenterology as they are more familiar with this process.    Hyponatremia Multifactorial including malignancy as well as mild hypovolemia.  Seems to have improved.  Normocytic anemia No evidence of overt bleeding.  Continue to monitor hemoglobin.  Slight drop in hemoglobin is likely dilutional.  Stable this morning.    Diabetes mellitus type 2, uncontrolled with hyperglycemia Continue SSI.  History of GERD Continue Protonix.   DVT Prophylaxis: SCDs for now Code Status: Full code Family Communication: Discussed with patient and his wife. Disposition Plan: Hopefully home in the next 24 to 48 hours.  Status is: Inpatient  Remains inpatient appropriate because:Ongoing diagnostic testing needed not appropriate for outpatient work up and IV treatments appropriate due to intensity of illness or inability to take PO  Dispo: The patient is from: Home  Anticipated d/c is to: Home              Patient currently is not medically stable to d/c.   Difficult to place patient No    Medications: Scheduled:  pantoprazole (PROTONIX) IV  40 mg Intravenous Daily   Continuous:  sodium chloride 75 mL/hr at 03/12/21 1402    piperacillin-tazobactam (ZOSYN)  IV 3.375 g (03/13/21 0534)   PRN:   Objective:  Vital Signs  Vitals:   03/12/21 1313 03/12/21 2105 03/13/21 0511 03/13/21 1307  BP: 109/65 132/69 121/71 138/81  Pulse: 88 93 94 89  Resp: '17 19 19 18  '$ Temp: 98.4 F (36.9 C) 99.1 F (37.3 C) 100.1 F (37.8 C) 98.5 F (36.9 C)  TempSrc: Oral Oral Oral Oral  SpO2: 97% 98% 95% 97%  Weight:      Height:        Intake/Output Summary (Last 24 hours) at 03/13/2021 1321 Last data filed at 03/13/2021 0900 Gross per 24 hour  Intake 403.17 ml  Output --  Net 403.17 ml    Filed Weights   03/11/21 1918 03/12/21 0400  Weight: 86.8 kg 84.7 kg    General appearance: Awake alert.  In no distress Resp: Clear to auscultation bilaterally.  Normal effort Cardio: S1-S2 is normal regular.  No S3-S4.  No rubs murmurs or bruit GI: Abdomen is soft.  Nontender nondistended.  Bowel sounds are present normal.  No masses organomegaly Extremities: No edema.  Full range of motion of lower extremities. Neurologic: Alert and oriented x3.  No focal neurological deficits.     Lab Results:  Data Reviewed: I have personally reviewed following labs and imaging studies  CBC: Recent Labs  Lab 03/07/21 1458 03/11/21 1936 03/12/21 0518 03/13/21 0429  WBC 9.3 11.0* 9.2 8.0  NEUTROABS 5.1 7.5  --  4.9  HGB 11.6* 10.4* 8.9* 8.5*  HCT 33.7* 29.9* 24.9* 24.7*  MCV 97.4 93.4 92.9 95.4  PLT 256 191 172 176     Basic Metabolic Panel: Recent Labs  Lab 03/07/21 1458 03/11/21 1936 03/12/21 0518 03/13/21 0429  NA 131* 130* 129* 135  K 4.4 3.8 3.5 3.6  CL 100 97* 101 103  CO2 26 25 21* 25  GLUCOSE 185* 144* 144* 127*  BUN '16 18 17 13  '$ CREATININE 0.82 1.01 0.81 0.79  CALCIUM 9.1 8.9 8.0* 8.2*  MG  --   --  1.9  --   PHOS  --   --  2.9  --      GFR: Estimated Creatinine Clearance: 98 mL/min (by C-G formula based on SCr of 0.79 mg/dL).  Liver Function Tests: Recent Labs  Lab 03/07/21 1458 03/11/21 1936  03/12/21 0518 03/13/21 0429  AST 95* 54* 41 45*  ALT 171* 105* 82* 77*  ALKPHOS 241* 209* 190* 190*  BILITOT 5.2* 4.8* 4.7* 3.4*  PROT 6.5 6.5 5.7* 5.5*  ALBUMIN 3.0* 2.8* 2.4* 2.2*       Coagulation Profile: Recent Labs  Lab 03/12/21 0518  INR 1.2       CBG: Recent Labs  Lab 03/12/21 0407 03/12/21 1105  GLUCAP 149* 129*      Recent Results (from the past 240 hour(s))  Resp Panel by RT-PCR (Flu A&B, Covid) Nasopharyngeal Swab     Status: None   Collection Time: 03/11/21 11:11 PM   Specimen: Nasopharyngeal Swab; Nasopharyngeal(NP) swabs in vial transport medium  Result Value Ref Range Status   SARS Coronavirus 2 by RT PCR NEGATIVE  NEGATIVE Final    Comment: (NOTE) SARS-CoV-2 target nucleic acids are NOT DETECTED.  The SARS-CoV-2 RNA is generally detectable in upper respiratory specimens during the acute phase of infection. The lowest concentration of SARS-CoV-2 viral copies this assay can detect is 138 copies/mL. A negative result does not preclude SARS-Cov-2 infection and should not be used as the sole basis for treatment or other patient management decisions. A negative result may occur with  improper specimen collection/handling, submission of specimen other than nasopharyngeal swab, presence of viral mutation(s) within the areas targeted by this assay, and inadequate number of viral copies(<138 copies/mL). A negative result must be combined with clinical observations, patient history, and epidemiological information. The expected result is Negative.  Fact Sheet for Patients:  EntrepreneurPulse.com.au  Fact Sheet for Healthcare Providers:  IncredibleEmployment.be  This test is no t yet approved or cleared by the [place] FDA and  has been authorized for detection and/or diagnosis of SARS-CoV-2 by FDA under an Emergency Use Authorization (EUA). This EUA will remain  in effect (meaning this test can be used) for  the duration of the COVID-19 declaration under Section 564(b)(1) of the Act, 21 U.S.C.section 360bbb-3(b)(1), unless the authorization is terminated  or revoked sooner.       Influenza A by PCR NEGATIVE NEGATIVE Final   Influenza B by PCR NEGATIVE NEGATIVE Final    Comment: (NOTE) The Xpert Xpress SARS-CoV-2/FLU/RSV plus assay is intended as an aid in the diagnosis of influenza from Nasopharyngeal swab specimens and should not be used as a sole basis for treatment. Nasal washings and aspirates are unacceptable for Xpert Xpress SARS-CoV-2/FLU/RSV testing.  Fact Sheet for Patients: EntrepreneurPulse.com.au  Fact Sheet for Healthcare Providers: IncredibleEmployment.be  This test is not yet approved or cleared by the [place] FDA and has been authorized for detection and/or diagnosis of SARS-CoV-2 by FDA under an Emergency Use Authorization (EUA). This EUA will remain in effect (meaning this test can be used) for the duration of the COVID-19 declaration under Section 564(b)(1) of the Act, 21 U.S.C. section 360bbb-3(b)(1), unless the authorization is terminated or revoked.  Performed at [facility], [address], [place], [place] [address]   Blood culture (routine x 2)     Status: None (Preliminary result)   Collection Time: 03/11/21 11:15 PM   Specimen: Right Antecubital; Blood  Result Value Ref Range Status   Specimen Description RIGHT ANTECUBITAL  Final   Special Requests   Final    BOTTLES DRAWN AEROBIC AND ANAEROBIC Blood Culture adequate volume   Culture   Final    NO GROWTH < 12 HOURS Performed at [facility] [address], [place], [place] [address]    Report Status PENDING  Incomplete  Blood culture (routine x 2)     Status: None (Preliminary result)   Collection Time: 03/11/21 11:19 PM   Specimen: Left Antecubital; Blood  Result Value Ref Range Status   Specimen Description LEFT ANTECUBITAL  Final   Special Requests    Final    BOTTLES DRAWN AEROBIC AND ANAEROBIC Blood Culture adequate volume   Culture   Final    NO GROWTH < 12 HOURS Performed at [facility], [address], [place], [place] [address]    Report Status PENDING  Incomplete       Radiology Studies: DG Chest 2 View  Result Date: 03/11/2021 CLINICAL DATA:  Fever and chills for several days EXAM: CHEST - 2 VIEW COMPARISON:  11/22/2014 FINDINGS: Frontal and lateral views of  the chest demonstrate an unremarkable cardiac silhouette. No airspace disease, effusion, or pneumothorax. There are no acute bony abnormalities. IMPRESSION: 1. No acute intrathoracic process. Electronically Signed   By: Randa Ngo M.D.   On: 03/11/2021 19:36   CT CHEST W CONTRAST  Result Date: 03/12/2021 CLINICAL DATA:  Persistent cough, fever and chills for 3 days. History of gastrointestinal cancer. EXAM: CT CHEST WITH CONTRAST TECHNIQUE: Multidetector CT imaging of the chest was performed during intravenous contrast administration. CONTRAST:  72m OMNIPAQUE IOHEXOL 300 MG/ML  SOLN COMPARISON:  CT abdomen/pelvis, same date. FINDINGS: Cardiovascular: The heart is normal in size. No pericardial effusion. The aorta is normal in caliber. No dissection. Mild atherosclerotic changes. Age advanced Three-vessel coronary artery calcifications are noted. Mediastinum/Nodes: Small scattered mediastinal and hilar lymph nodes but no mass or overt adenopathy. The esophagus is grossly normal. Lungs/Pleura: No acute pulmonary findings. No worrisome pulmonary lesions or pulmonary nodules to suggest metastatic disease. Upper Abdomen: Biliary stent in place with pneumobilia and intrahepatic biliary dilatation. No upper abdominal adenopathy. Musculoskeletal: No significant bony findings. IMPRESSION: 1. No acute pulmonary findings, worrisome pulmonary lesions or pulmonary nodules to suggest metastatic disease. 2. Age advanced Three-vessel coronary artery calcifications. 3. Biliary stent in place with  pneumobilia and intrahepatic biliary dilatation. 4. Aortic atherosclerosis. Aortic Atherosclerosis (ICD10-I70.0). Electronically Signed   By: PMarijo SanesM.D.   On: 03/12/2021 17:03   CT ABDOMEN PELVIS W CONTRAST  Result Date: 03/12/2021 CLINICAL DATA:  Fever and chills. EXAM: CT ABDOMEN AND PELVIS WITH CONTRAST TECHNIQUE: Multidetector CT imaging of the abdomen and pelvis was performed using the standard protocol following bolus administration of intravenous contrast. CONTRAST:  1061mOMNIPAQUE IOHEXOL 300 MG/ML  SOLN COMPARISON:  February 24, 2021 FINDINGS: Lower chest: No acute abnormality. Hepatobiliary: No focal liver abnormality is seen. There is stable moderate severity intrahepatic and extra hepatic biliary dilatation. Mild pneumobilia is noted which represents a new finding. A common bile duct stent is in place. This represents a new finding when compared to the prior study. No gallstones are seen within a moderately distended gallbladder lumen. Stable focal gallbladder wall thickening is noted. Pancreas: Unremarkable. No pancreatic ductal dilatation or surrounding inflammatory changes. Spleen: Normal in size without focal abnormality. Adrenals/Urinary Tract: Adrenal glands are unremarkable. Kidneys are normal, without renal calculi, focal lesion, or hydronephrosis. Bladder is unremarkable. Stomach/Bowel: Stomach is within normal limits. Appendix appears normal. No evidence of dilatation. Multiple diverticula are seen throughout the sigmoid colon. Mild thickening of the proximal to mid sigmoid colon is also noted. Vascular/Lymphatic: Aortic atherosclerosis. No enlarged abdominal or pelvic lymph nodes. Reproductive: The prostate gland is mildly enlarged. Other: A 1.9 cm x 1.6 cm fat containing umbilical hernia is noted. No abdominopelvic ascites. Musculoskeletal: Multilevel degenerative changes are seen throughout the lumbar spine. IMPRESSION: 1. Interval common bile duct stent placement since the prior  study, with stable intrahepatic and extrahepatic biliary dilatation. 2. Persistent moderate severity gallbladder distension with stable focal gallbladder wall thickening. Focal cholecystitis versus neoplasm cannot be excluded. 3. Findings suggestive of mild colitis versus diverticulitis within the proximal to mid sigmoid colon. Electronically Signed   By: ThVirgina Norfolk.D.   On: 03/12/2021 00:46       LOS: 1 day   [facility] Pager on [email or web address]  03/13/2021, 1:21 PM

## 2021-03-14 ENCOUNTER — Ambulatory Visit (HOSPITAL_BASED_OUTPATIENT_CLINIC_OR_DEPARTMENT_OTHER): Admission: RE | Admit: 2021-03-14 | Payer: Commercial Managed Care - PPO | Source: Ambulatory Visit

## 2021-03-14 DIAGNOSIS — D72829 Elevated white blood cell count, unspecified: Secondary | ICD-10-CM

## 2021-03-14 DIAGNOSIS — C221 Intrahepatic bile duct carcinoma: Secondary | ICD-10-CM

## 2021-03-14 DIAGNOSIS — R7401 Elevation of levels of liver transaminase levels: Secondary | ICD-10-CM

## 2021-03-14 DIAGNOSIS — K219 Gastro-esophageal reflux disease without esophagitis: Secondary | ICD-10-CM

## 2021-03-14 DIAGNOSIS — K810 Acute cholecystitis: Secondary | ICD-10-CM

## 2021-03-14 DIAGNOSIS — K5792 Diverticulitis of intestine, part unspecified, without perforation or abscess without bleeding: Secondary | ICD-10-CM

## 2021-03-14 LAB — FERRITIN: Ferritin: 708 ng/mL — ABNORMAL HIGH (ref 24–336)

## 2021-03-14 LAB — COMPREHENSIVE METABOLIC PANEL
ALT: 70 U/L — ABNORMAL HIGH (ref 0–44)
AST: 41 U/L (ref 15–41)
Albumin: 2.2 g/dL — ABNORMAL LOW (ref 3.5–5.0)
Alkaline Phosphatase: 200 U/L — ABNORMAL HIGH (ref 38–126)
Anion gap: 5 (ref 5–15)
BUN: 11 mg/dL (ref 8–23)
CO2: 26 mmol/L (ref 22–32)
Calcium: 8.3 mg/dL — ABNORMAL LOW (ref 8.9–10.3)
Chloride: 104 mmol/L (ref 98–111)
Creatinine, Ser: 0.75 mg/dL (ref 0.61–1.24)
GFR, Estimated: >60 mL/min (ref >60)
Glucose, Bld: 122 mg/dL — ABNORMAL HIGH (ref 70–99)
Potassium: 3.8 mmol/L (ref 3.5–5.1)
Sodium: 135 mmol/L (ref 135–145)
Total Bilirubin: 3.1 mg/dL — ABNORMAL HIGH (ref 0.3–1.2)
Total Protein: 5.6 g/dL — ABNORMAL LOW (ref 6.5–8.1)

## 2021-03-14 LAB — CBC WITH DIFFERENTIAL/PLATELET
Abs Immature Granulocytes: 0.06 10*3/uL (ref 0.00–0.07)
Basophils Absolute: 0 10*3/uL (ref 0.0–0.1)
Basophils Relative: 0 %
Eosinophils Absolute: 0.5 10*3/uL (ref 0.0–0.5)
Eosinophils Relative: 5 %
HCT: 26.1 % — ABNORMAL LOW (ref 39.0–52.0)
Hemoglobin: 8.7 g/dL — ABNORMAL LOW (ref 13.0–17.0)
Immature Granulocytes: 1 %
Lymphocytes Relative: 19 %
Lymphs Abs: 1.7 10*3/uL (ref 0.7–4.0)
MCH: 32.5 pg (ref 26.0–34.0)
MCHC: 33.3 g/dL (ref 30.0–36.0)
MCV: 97.4 fL (ref 80.0–100.0)
Monocytes Absolute: 1.3 10*3/uL — ABNORMAL HIGH (ref 0.1–1.0)
Monocytes Relative: 14 %
Neutro Abs: 5.5 10*3/uL (ref 1.7–7.7)
Neutrophils Relative %: 61 %
Platelets: 200 10*3/uL (ref 150–400)
RBC: 2.68 MIL/uL — ABNORMAL LOW (ref 4.22–5.81)
RDW: 13.5 % (ref 11.5–15.5)
WBC: 9 10*3/uL (ref 4.0–10.5)
nRBC: 0 % (ref 0.0–0.2)

## 2021-03-14 LAB — PROCALCITONIN: Procalcitonin: 1.3 ng/mL

## 2021-03-14 LAB — IRON AND TIBC
Iron: 27 ug/dL — ABNORMAL LOW (ref 45–182)
Saturation Ratios: 17 % — ABNORMAL LOW (ref 17.9–39.5)
TIBC: 154 ug/dL — ABNORMAL LOW (ref 250–450)
UIBC: 127 ug/dL

## 2021-03-14 LAB — VITAMIN B12: Vitamin B-12: 576 pg/mL (ref 180–914)

## 2021-03-14 MED ORDER — AMOXICILLIN-POT CLAVULANATE 875-125 MG PO TABS: 1.0000 | ORAL_TABLET | Freq: Two times a day (BID) | ORAL | 0 refills | Status: AC

## 2021-03-14 MED ORDER — AMOXICILLIN-POT CLAVULANATE 875-125 MG PO TABS
1.0000 | ORAL_TABLET | Freq: Two times a day (BID) | ORAL | Status: DC
  Administered 2021-03-14: 1 via ORAL
  Filled 2021-03-14: qty 1

## 2021-03-14 NOTE — Telephone Encounter (Signed)
Pt has not yet been discharged.

## 2021-03-14 NOTE — Progress Notes (Signed)
Patient is eager for possible discharge today. He is alert, oriented. No complaints of pain. Wife is at bedside.

## 2021-03-14 NOTE — Progress Notes (Signed)
Subjective:  Patient has no complaints.  He denies abdominal pain nausea vomiting chest pain shortness of breath fever or chills.  His appetite is good but he does not like hospital food.  He is passing formed stool.  He says urine is almost back to normal color.  Current Medications:  Current Facility-Administered Medications:    0.9 %  sodium chloride infusion, , Intravenous, Continuous, Bonnielee Haff, MD, Last Rate: 30 mL/hr at 03/13/21 1423, Rate Change at 03/13/21 1423   pantoprazole (PROTONIX) injection 40 mg, 40 mg, Intravenous, Daily, Adefeso, Oladapo, DO, 40 mg at 03/14/21 0816   piperacillin-tazobactam (ZOSYN) IVPB 3.375 g, 3.375 g, Intravenous, Q8H, Adefeso, Oladapo, DO, Last Rate: 12.5 mL/hr at 03/14/21 0542, 3.375 g at 03/14/21 0542   Objective: Blood pressure 132/77, pulse 86, temperature 98.5 F (36.9 C), temperature source Oral, resp. rate 14, height 5' 11"  (1.803 m), weight 84.7 kg, SpO2 95 %. Patient is alert and in no acute distress. Conjunctiva is pink. Sclera is mildly icteric Oropharyngeal mucosa is normal. No neck masses or thyromegaly noted. Cardiac exam with regular rhythm normal S1 and S2. No murmur or gallop noted. Lungs are clear to auscultation. Abdomen he has small umbilical hernia which is partially reducible.  It is soft.  Abdominal exam is otherwise normal. No LE edema or clubbing noted.  Labs/studies Results:   CBC Latest Ref Rng & Units 03/14/2021 03/13/2021 03/12/2021  WBC 4.0 - 10.5 K/uL 9.0 8.0 9.2  Hemoglobin 13.0 - 17.0 g/dL 8.7(L) 8.5(L) 8.9(L)  Hematocrit 39.0 - 52.0 % 26.1(L) 24.7(L) 24.9(L)  Platelets 150 - 400 K/uL 200 176 172    CMP Latest Ref Rng & Units 03/14/2021 03/13/2021 03/12/2021  Glucose 70 - 99 mg/dL 122(H) 127(H) 144(H)  BUN 8 - 23 mg/dL 11 13 17   Creatinine 0.61 - 1.24 mg/dL 0.75 0.79 0.81  Sodium 135 - 145 mmol/L 135 135 129(L)  Potassium 3.5 - 5.1 mmol/L 3.8 3.6 3.5  Chloride 98 - 111 mmol/L 104 103 101  CO2 22 - 32 mmol/L 26  25 21(L)  Calcium 8.9 - 10.3 mg/dL 8.3(L) 8.2(L) 8.0(L)  Total Protein 6.5 - 8.1 g/dL 5.6(L) 5.5(L) 5.7(L)  Total Bilirubin 0.3 - 1.2 mg/dL 3.1(H) 3.4(H) 4.7(H)  Alkaline Phos 38 - 126 U/L 200(H) 190(H) 190(H)  AST 15 - 41 U/L 41 45(H) 41  ALT 0 - 44 U/L 70(H) 77(H) 82(H)    Hepatic Function Latest Ref Rng & Units 03/14/2021 03/13/2021 03/12/2021  Total Protein 6.5 - 8.1 g/dL 5.6(L) 5.5(L) 5.7(L)  Albumin 3.5 - 5.0 g/dL 2.2(L) 2.2(L) 2.4(L)  AST 15 - 41 U/L 41 45(H) 41  ALT 0 - 44 U/L 70(H) 77(H) 82(H)  Alk Phosphatase 38 - 126 U/L 200(H) 190(H) 190(H)  Total Bilirubin 0.3 - 1.2 mg/dL 3.1(H) 3.4(H) 4.7(H)  Bilirubin, Direct 0.0 - 0.2 mg/dL - - -    Blood cultures remain negative.  Procalcitonin 1.30.  It was 2.99 on admission.    Assessment:  #1.  Fever most likely secondary to cholangitis involving left hepatic system which is completely obstructed.  Blood cultures remain negative.  He does not have any other source of infection.  He will need 1 more week of antibiotic.  Discussed with Dr. Bonnielee Haff.  Augmentin would be appropriate.  #2.  Hilar cholangiocarcinoma obstructing both left and the right system.  Right system has been decompressed.  Cholestasis continue to improve.  Bilirubin was almost 21 and now is down to 3.1.  Patient is  to be seen at [facility] next week. Will provide him with DVD highlighting imaging studies and ERCP.  #3.  Anemia.  No evidence of GI bleed.  Will obtain iron studies B12 and folate levels before his discharge.  #4.  Focal sigmoid colon wall thickening noted on CT.  He has no GI symptoms.  He needs colonoscopy which will be arranged when he returns from [facility].   Recommendations  Agree with Dr. Pricilla Handler recommendation regarding Augmentin for 1 week. Will obtain serum iron TIBC ferritin and B12 levels. Colonoscopy would be arranged after he returns from evaluation at [facility] or if convenient he can have an exam while he is  there.

## 2021-03-14 NOTE — Discharge Summary (Signed)
[facility]  Physician Discharge Summary   Patient ID: Jonathan Keith MRN: [id number] DOB/AGE: 1955-12-14 65 y.o.  Admit date: 03/11/2021 Discharge date: 03/14/2021    PCP: [name], PA-C  DISCHARGE DIAGNOSES:  Colitis versus sigmoid diverticulitis Carcinoma with obstructive jaundice Normocytic anemia Diabetes mellitus type 2, uncontrolled with hyperglycemia Abnormal LFTs  RECOMMENDATIONS FOR OUTPATIENT FOLLOW UP: To go to [facility] next week to discuss treatment options for his cholangiocarcinoma   Home Health: None none Equipment/Devices: None  CODE STATUS: Full code  DISCHARGE CONDITION: fair  Diet recommendation: As before  INITIAL HISTORY: 65 y.o. male with medical history significant for cholangiocarcinoma, type II DM, GERD who presented to the emergency department due to 3-day onset of fever, diaphoresis and chills at night.  Symptoms occur mainly in the nighttime.  Patient recently diagnosed with cholangiocarcinoma.  He is supposed to go to [facility] next week for second opinion regarding treatment options.  Patient also had a biliary stent placed during previous hospitalization for obstructive jaundice.  Patient was hospitalized for further management.      Consultants: Gastroenterology   HOSPITAL COURSE:   Febrile illness/colitis versus sigmoid diverticulitis Patient was admitted due to low-grade fever with recent diagnosis of cancer.  CT scan suggested colitis versus sigmoid diverticulitis.  Patient was placed on Zosyn.  Fevers appear to have subsided.  Patient feels better.  Seen by gastroenterology and has cleared for discharge.  He will be discharged on 7 days of Augmentin.    Abnormal LFTs with recent obstructive jaundice Underwent biliary stent placement recently for obstructive jaundice.  His bilirubin has been improving gradually.     Cholangiocarcinoma Patient plans to see providers at [facility] next week regarding treatment  options. Seen by local medical oncology recently.  CT chest did not show any evidence for metastatic disease.  However calcifications were noted in the coronary vessels.  Per gastroenterology patient may need cardiac work-up prior to his cancer surgery.  Will defer this to gastroenterology as they are more familiar with this process.     Hyponatremia Multifactorial including malignancy as well as mild hypovolemia.  Seems to have improved.  Normocytic anemia No evidence of overt bleeding.  Continue to monitor hemoglobin.  Slight drop in hemoglobin is likely dilutional.     Diabetes mellitus type 2, uncontrolled with hyperglycemia Continue SSI.  History of GERD Continue Protonix.  Overall stable.  Okay for discharge home today.   PERTINENT LABS:  The results of significant diagnostics from this hospitalization (including imaging, microbiology, ancillary and laboratory) are listed below for reference.    Microbiology: Recent Results (from the past 240 hour(s))  Resp Panel by RT-PCR (Flu A&B, Covid) Nasopharyngeal Swab     Status: None   Collection Time: 03/11/21 11:11 PM   Specimen: Nasopharyngeal Swab; Nasopharyngeal(NP) swabs in vial transport medium  Result Value Ref Range Status   SARS Coronavirus 2 by RT PCR NEGATIVE NEGATIVE Final    Comment: (NOTE) SARS-CoV-2 target nucleic acids are NOT DETECTED.  The SARS-CoV-2 RNA is generally detectable in upper respiratory specimens during the acute phase of infection. The lowest concentration of SARS-CoV-2 viral copies this assay can detect is 138 copies/mL. A negative result does not preclude SARS-Cov-2 infection and should not be used as the sole basis for treatment or other patient management decisions. A negative result may occur with  improper specimen collection/handling, submission of specimen other than nasopharyngeal swab, presence of viral mutation(s) within the areas targeted by this assay,  and inadequate number of  viral copies(<138 copies/mL). A negative result must be combined with clinical observations, patient history, and epidemiological information. The expected result is Negative.  Fact Sheet for Patients:  EntrepreneurPulse.com.au  Fact Sheet for Healthcare Providers:  IncredibleEmployment.be  This test is no t yet approved or cleared by the [place] FDA and  has been authorized for detection and/or diagnosis of SARS-CoV-2 by FDA under an Emergency Use Authorization (EUA). This EUA will remain  in effect (meaning this test can be used) for the duration of the COVID-19 declaration under Section 564(b)(1) of the Act, 21 U.S.C.section 360bbb-3(b)(1), unless the authorization is terminated  or revoked sooner.       Influenza A by PCR NEGATIVE NEGATIVE Final   Influenza B by PCR NEGATIVE NEGATIVE Final    Comment: (NOTE) The Xpert Xpress SARS-CoV-2/FLU/RSV plus assay is intended as an aid in the diagnosis of influenza from Nasopharyngeal swab specimens and should not be used as a sole basis for treatment. Nasal washings and aspirates are unacceptable for Xpert Xpress SARS-CoV-2/FLU/RSV testing.  Fact Sheet for Patients: EntrepreneurPulse.com.au  Fact Sheet for Healthcare Providers: IncredibleEmployment.be  This test is not yet approved or cleared by the [place] FDA and has been authorized for detection and/or diagnosis of SARS-CoV-2 by FDA under an Emergency Use Authorization (EUA). This EUA will remain in effect (meaning this test can be used) for the duration of the COVID-19 declaration under Section 564(b)(1) of the Act, 21 U.S.C. section 360bbb-3(b)(1), unless the authorization is terminated or revoked.  Performed at [facility], [address], [place], [place] [address]   Blood culture (routine x 2)     Status: None (Preliminary result)   Collection Time: 03/11/21 11:15 PM   Specimen: Right  Antecubital; Blood  Result Value Ref Range Status   Specimen Description RIGHT ANTECUBITAL  Final   Special Requests   Final    BOTTLES DRAWN AEROBIC AND ANAEROBIC Blood Culture adequate volume   Culture   Final    NO GROWTH 4 DAYS Performed at [facility], [address], [place], [place] [address]    Report Status PENDING  Incomplete  Blood culture (routine x 2)     Status: None (Preliminary result)   Collection Time: 03/11/21 11:19 PM   Specimen: Left Antecubital; Blood  Result Value Ref Range Status   Specimen Description LEFT ANTECUBITAL  Final   Special Requests   Final    BOTTLES DRAWN AEROBIC AND ANAEROBIC Blood Culture adequate volume   Culture   Final    NO GROWTH 4 DAYS Performed at [facility], [address], [place], [place] [address]    Report Status PENDING  Incomplete     Labs:  COVID-19 Labs  Recent Labs    03/13/21 0429  FERRITIN 708*    Lab Results  Component Value Date   SARSCOV2NAA NEGATIVE 03/11/2021   [place] NEGATIVE 02/24/2021   [place] Not Detected 09/08/2019      Basic Metabolic Panel: Recent Labs  Lab 03/11/21 1936 03/12/21 0518 03/13/21 0429 03/14/21 0430  NA 130* 129* 135 135  K 3.8 3.5 3.6 3.8  CL 97* 101 103 104  CO2 25 21* 25 26  GLUCOSE 144* 144* 127* 122*  BUN '18 17 13 11  '$ CREATININE 1.01 0.81 0.79 0.75  CALCIUM 8.9 8.0* 8.2* 8.3*  MG  --  1.9  --   --   PHOS  --  2.9  --   --  Liver Function Tests: Recent Labs  Lab 03/11/21 1936 03/12/21 0518 03/13/21 0429 03/14/21 0430  AST 54* 41 45* 41  ALT 105* 82* 77* 70*  ALKPHOS 209* 190* 190* 200*  BILITOT 4.8* 4.7* 3.4* 3.1*  PROT 6.5 5.7* 5.5* 5.6*  ALBUMIN 2.8* 2.4* 2.2* 2.2*    CBC: Recent Labs  Lab 03/11/21 1936 03/12/21 0518 03/13/21 0429 03/14/21 0430  WBC 11.0* 9.2 8.0 9.0  NEUTROABS 7.5  --  4.9 5.5  HGB 10.4* 8.9* 8.5* 8.7*  HCT 29.9* 24.9* 24.7* 26.1*  MCV 93.4 92.9 95.4 97.4  PLT 191 172 176 200     CBG: Recent Labs  Lab  03/12/21 0407 03/12/21 1105  GLUCAP 149* 129*     IMAGING STUDIES DG Chest 2 View  Result Date: 03/11/2021 CLINICAL DATA:  Fever and chills for several days EXAM: CHEST - 2 VIEW COMPARISON:  11/22/2014 FINDINGS: Frontal and lateral views of the chest demonstrate an unremarkable cardiac silhouette. No airspace disease, effusion, or pneumothorax. There are no acute bony abnormalities. IMPRESSION: 1. No acute intrathoracic process. Electronically Signed   By: Randa Ngo M.D.   On: 03/11/2021 19:36   CT CHEST W CONTRAST  Result Date: 03/12/2021 CLINICAL DATA:  Persistent cough, fever and chills for 3 days. History of gastrointestinal cancer. EXAM: CT CHEST WITH CONTRAST TECHNIQUE: Multidetector CT imaging of the chest was performed during intravenous contrast administration. CONTRAST:  48m OMNIPAQUE IOHEXOL 300 MG/ML  SOLN COMPARISON:  CT abdomen/pelvis, same date. FINDINGS: Cardiovascular: The heart is normal in size. No pericardial effusion. The aorta is normal in caliber. No dissection. Mild atherosclerotic changes. Age advanced Three-vessel coronary artery calcifications are noted. Mediastinum/Nodes: Small scattered mediastinal and hilar lymph nodes but no mass or overt adenopathy. The esophagus is grossly normal. Lungs/Pleura: No acute pulmonary findings. No worrisome pulmonary lesions or pulmonary nodules to suggest metastatic disease. Upper Abdomen: Biliary stent in place with pneumobilia and intrahepatic biliary dilatation. No upper abdominal adenopathy. Musculoskeletal: No significant bony findings. IMPRESSION: 1. No acute pulmonary findings, worrisome pulmonary lesions or pulmonary nodules to suggest metastatic disease. 2. Age advanced Three-vessel coronary artery calcifications. 3. Biliary stent in place with pneumobilia and intrahepatic biliary dilatation. 4. Aortic atherosclerosis. Aortic Atherosclerosis (ICD10-I70.0). Electronically Signed   By: PMarijo SanesM.D.   On: 03/12/2021 17:03    CT ABDOMEN PELVIS W CONTRAST  Result Date: 03/12/2021 CLINICAL DATA:  Fever and chills. EXAM: CT ABDOMEN AND PELVIS WITH CONTRAST TECHNIQUE: Multidetector CT imaging of the abdomen and pelvis was performed using the standard protocol following bolus administration of intravenous contrast. CONTRAST:  1074mOMNIPAQUE IOHEXOL 300 MG/ML  SOLN COMPARISON:  February 24, 2021 FINDINGS: Lower chest: No acute abnormality. Hepatobiliary: No focal liver abnormality is seen. There is stable moderate severity intrahepatic and extra hepatic biliary dilatation. Mild pneumobilia is noted which represents a new finding. A common bile duct stent is in place. This represents a new finding when compared to the prior study. No gallstones are seen within a moderately distended gallbladder lumen. Stable focal gallbladder wall thickening is noted. Pancreas: Unremarkable. No pancreatic ductal dilatation or surrounding inflammatory changes. Spleen: Normal in size without focal abnormality. Adrenals/Urinary Tract: Adrenal glands are unremarkable. Kidneys are normal, without renal calculi, focal lesion, or hydronephrosis. Bladder is unremarkable. Stomach/Bowel: Stomach is within normal limits. Appendix appears normal. No evidence of dilatation. Multiple diverticula are seen throughout the sigmoid colon. Mild thickening of the proximal to mid sigmoid colon is also noted. Vascular/Lymphatic: Aortic atherosclerosis. No enlarged  abdominal or pelvic lymph nodes. Reproductive: The prostate gland is mildly enlarged. Other: A 1.9 cm x 1.6 cm fat containing umbilical hernia is noted. No abdominopelvic ascites. Musculoskeletal: Multilevel degenerative changes are seen throughout the lumbar spine. IMPRESSION: 1. Interval common bile duct stent placement since the prior study, with stable intrahepatic and extrahepatic biliary dilatation. 2. Persistent moderate severity gallbladder distension with stable focal gallbladder wall thickening. Focal  cholecystitis versus neoplasm cannot be excluded. 3. Findings suggestive of mild colitis versus diverticulitis within the proximal to mid sigmoid colon. Electronically Signed   By: Virgina Norfolk M.D.   On: 03/12/2021 00:46   CT ABDOMEN PELVIS W CONTRAST  Result Date: 02/24/2021 CLINICAL DATA:  Abdominal distension, weakness, jaundice EXAM: CT ABDOMEN AND PELVIS WITH CONTRAST TECHNIQUE: Multidetector CT imaging of the abdomen and pelvis was performed using the standard protocol following bolus administration of intravenous contrast. CONTRAST:  182m OMNIPAQUE IOHEXOL 300 MG/ML  SOLN COMPARISON:  None. FINDINGS: Lower chest: No acute pleural or parenchymal lung disease. Minimal bilateral lower lobe bronchiectasis. Hepatobiliary: There is marked intrahepatic and extrahepatic biliary duct dilation, with common bile duct measuring up to 10 mm in size. There are multiple rounded areas of high attenuation within the common hepatic duct and common bile duct, reference images 22, 23, 26. Largest area measures 6 mm. The gallbladder is moderately distended. Circumferential wall thickening and enhancement is seen within the mid aspect of the gallbladder. This irregular area of gallbladder wall thickening measures up to 7 mm. Differential includes localized inflammatory change versus neoplasm. Pancreas: The pancreas enhances normally. Mild fatty atrophy of the pancreatic body and tail. No surrounding inflammatory changes. Spleen: Normal in size without focal abnormality. Adrenals/Urinary Tract: Adrenal glands are unremarkable. Kidneys are normal, without renal calculi, focal lesion, or hydronephrosis. Bladder is unremarkable. Stomach/Bowel: No bowel obstruction or ileus. Normal appendix right lower quadrant. Scattered diverticulosis throughout the colon. There is nonspecific wall thickening of the mid sigmoid colon without surrounding inflammation. This may reflect wall thickening due to under distension versus scarring  from previous bouts of diverticulitis. Vascular/Lymphatic: Aortic atherosclerosis. No enlarged abdominal or pelvic lymph nodes. Reproductive: Prostate is unremarkable. Other: No free fluid or free gas. Small fat containing umbilical hernia. No bowel herniation. Musculoskeletal: No acute or destructive bony lesions. Reconstructed images demonstrate no additional findings. IMPRESSION: 1. Intrahepatic and extrahepatic biliary duct dilation, with multiple small high attenuation obstructing foci within the common hepatic and common bile duct as above. The appearance would favor multiple obstructing common bile duct calculi. ERCP may be useful for further evaluation and decompression of the biliary system. 2. Moderate gallbladder distension, with focal circumferential wall thickening and enhancement within the body of the gallbladder. Differential diagnosis includes focal cholecystitis versus neoplasm. If further imaging is desired, dedicated liver MRI with and without contrast could be considered. 3. Diverticulosis of the sigmoid colon without evidence of acute diverticulitis. Nonspecific sigmoid colon wall thickening most consistent with scarring from previous bouts of diverticulitis. 4.  Aortic Atherosclerosis (ICD10-I70.0). Electronically Signed   By: MRanda NgoM.D.   On: 02/24/2021 15:17   MR 3D Recon At Scanner  Result Date: 02/25/2021 CLINICAL DATA:  Jaundice, focal gallbladder wall thickening on CT EXAM: MRI ABDOMEN WITHOUT AND WITH CONTRAST (INCLUDING MRCP) TECHNIQUE: Multiplanar multisequence MR imaging of the abdomen was performed both before and after the administration of intravenous contrast. Heavily T2-weighted images of the biliary and pancreatic ducts were obtained, and three-dimensional MRCP images were rendered by post processing. CONTRAST:  824m  GADAVIST GADOBUTROL 1 MMOL/ML IV SOLN COMPARISON:  CT abdomen/pelvis dated 02/24/2021 FINDINGS: Lower chest: Lung bases are clear. Hepatobiliary: Liver  is within normal limits. No suspicious/enhancing hepatic lesions. However, there is associated moderate intrahepatic ductal dilatation with suspected abnormal enhancing soft tissue at the hilar confluence measuring 12 mm (series 23/image 43), raising the concern for a small hilar cholangiocarcinoma/Klatskin tumor. Gallbladder is notable for a 3.5 cm gallstone (series 4/image 20). There is associated focal wall thickening with narrowing/stenosis (series 4/image 19) in the mid gallbladder, with enhancement (series 18/image 40) in this region and proximally along the gallstone. While neoplasm remains possible, this overall appearance favors reactive inflammation secondary to the large gallstone. Distal CBD is nondilated, measuring 6 mm. No choledocholithiasis is seen. Pancreas:  Within normal limits. Spleen:  Within normal limits. Adrenals/Urinary Tract:  Adrenal glands are within normal limits. Kidneys are within normal limits.  No hydronephrosis. Stomach/Bowel: Stomach is within normal limits. Visualized bowel is unremarkable. Vascular/Lymphatic:  No evidence of abdominal aortic aneurysm. No suspicious abdominal lymphadenopathy. Other:  No abdominal ascites. Musculoskeletal: No focal osseous lesions. IMPRESSION: Suspected 12 mm enhancing soft tissue lesion at the hilar confluence, raise concern for hilar cholangiocarcinoma/Klatskin tumor. Associated moderate intrahepatic ductal dilatation. 3.5 cm gallstone. Adjacent wall thickening/narrowing of the mid gallbladder, with surrounding enhancement, favored to reflect reactive inflammation. Gallbladder neoplasm remains possible but is considered less likely. Distal CBD is nondilated, measuring 6 mm. No choledocholithiasis is seen. Electronically Signed   By: Julian Hy M.D.   On: 02/25/2021 20:09   DG ERCP  Result Date: 02/27/2021 CLINICAL DATA:  History of cholangio carcinoma with obstructive jaundice, post ERCP. EXAM: ERCP TECHNIQUE: Multiple spot images  obtained with the fluoroscopic device and submitted for interpretation post-procedure. COMPARISON:  ERCP-02/25/2021 CT abdomen pelvis-02/24/2021 MRCP-02/25/2021 FLUOROSCOPY TIME:  6 minutes, 9 seconds (141.5 mGy) FINDINGS: Multiple spot fluoroscopic images of the right upper abdominal quadrant during ERCP are provided for review. Initial image demonstrates an ERCP probe overlying the right upper abdominal quadrant. A biliary stent overlies expected location of the CBD. Subsequent images demonstrate removal of the biliary stent with subsequent cannulation and opacification of the CBD which demonstrates tapered irregularity at its central aspect. There is moderate to marked dilatation of the opacified central aspect of the intrahepatic biliary tree. Subsequent images demonstrate apparent biliary plasty at the level the hilum. Completion images demonstrate placement of a new internal biliary stent. IMPRESSION: ERCP with biliary plasty and stent exchange above. These images were submitted for radiologic interpretation only. Please see the procedural report for the amount of contrast and the fluoroscopy time utilized. Electronically Signed   By: Sandi Mariscal M.D.   On: 02/27/2021 14:08   DG ERCP  Result Date: 02/25/2021 CLINICAL DATA:  Jaundice EXAM: ERCP TECHNIQUE: Multiple spot images obtained with the fluoroscopic device and submitted for interpretation post-procedure. COMPARISON:  CT 02/24/2021 FINDINGS: Series of fluoroscopic spot images document endoscopic cannulation and opacification of the pancreatic duct and subsequently the biliary tree. There is segmental narrowing of proximal and mid CBD on limited images. Subsequent images document placement of plastic biliary stent. The intrahepatic biliary tree is incompletely opacified, appearing dilated centrally. IMPRESSION: 1. Segmental CBD narrowing, with endoscopic stent placement. These images were submitted for radiologic interpretation only. Please see the  procedural report for the amount of contrast and the fluoroscopy time utilized. Electronically Signed   By: Lucrezia Europe M.D.   On: 02/25/2021 15:02   MR ABDOMEN MRCP W WO CONTAST  Result Date:  02/25/2021 CLINICAL DATA:  Jaundice, focal gallbladder wall thickening on CT EXAM: MRI ABDOMEN WITHOUT AND WITH CONTRAST (INCLUDING MRCP) TECHNIQUE: Multiplanar multisequence MR imaging of the abdomen was performed both before and after the administration of intravenous contrast. Heavily T2-weighted images of the biliary and pancreatic ducts were obtained, and three-dimensional MRCP images were rendered by post processing. CONTRAST:  39m GADAVIST GADOBUTROL 1 MMOL/ML IV SOLN COMPARISON:  CT abdomen/pelvis dated 02/24/2021 FINDINGS: Lower chest: Lung bases are clear. Hepatobiliary: Liver is within normal limits. No suspicious/enhancing hepatic lesions. However, there is associated moderate intrahepatic ductal dilatation with suspected abnormal enhancing soft tissue at the hilar confluence measuring 12 mm (series 23/image 43), raising the concern for a small hilar cholangiocarcinoma/Klatskin tumor. Gallbladder is notable for a 3.5 cm gallstone (series 4/image 20). There is associated focal wall thickening with narrowing/stenosis (series 4/image 19) in the mid gallbladder, with enhancement (series 18/image 40) in this region and proximally along the gallstone. While neoplasm remains possible, this overall appearance favors reactive inflammation secondary to the large gallstone. Distal CBD is nondilated, measuring 6 mm. No choledocholithiasis is seen. Pancreas:  Within normal limits. Spleen:  Within normal limits. Adrenals/Urinary Tract:  Adrenal glands are within normal limits. Kidneys are within normal limits.  No hydronephrosis. Stomach/Bowel: Stomach is within normal limits. Visualized bowel is unremarkable. Vascular/Lymphatic:  No evidence of abdominal aortic aneurysm. No suspicious abdominal lymphadenopathy. Other:  No  abdominal ascites. Musculoskeletal: No focal osseous lesions. IMPRESSION: Suspected 12 mm enhancing soft tissue lesion at the hilar confluence, raise concern for hilar cholangiocarcinoma/Klatskin tumor. Associated moderate intrahepatic ductal dilatation. 3.5 cm gallstone. Adjacent wall thickening/narrowing of the mid gallbladder, with surrounding enhancement, favored to reflect reactive inflammation. Gallbladder neoplasm remains possible but is considered less likely. Distal CBD is nondilated, measuring 6 mm. No choledocholithiasis is seen. Electronically Signed   By: SJulian HyM.D.   On: 02/25/2021 20:09    DISCHARGE EXAMINATION: Vitals:   03/13/21 0511 03/13/21 1307 03/13/21 2233 03/14/21 0544  BP: 121/71 138/81 134/72 132/77  Pulse: 94 89 98 86  Resp: '19 18 18 14  '$ Temp: 100.1 F (37.8 C) 98.5 F (36.9 C) 98.4 F (36.9 C) 98.5 F (36.9 C)  TempSrc: Oral Oral Oral Oral  SpO2: 95% 97% 95% 95%  Weight:      Height:       General appearance: Awake alert.  In no distress Resp: Clear to auscultation bilaterally.  Normal effort Cardio: S1-S2 is normal regular.  No S3-S4.  No rubs murmurs or bruit GI: Abdomen is soft.  Nontender nondistended.  Bowel sounds are present normal.  No masses organomegaly Extremities: No edema.  Full range of motion of lower extremities. Neurologic: Alert and oriented x3.  No focal neurological deficits.    DISPOSITION: Home  Discharge Instructions     Call MD for:  difficulty breathing, headache or visual disturbances   Complete by: As directed    Call MD for:  extreme fatigue   Complete by: As directed    Call MD for:  hives   Complete by: As directed    Call MD for:  persistant dizziness or light-headedness   Complete by: As directed    Call MD for:  persistant nausea and vomiting   Complete by: As directed    Call MD for:  severe uncontrolled pain   Complete by: As directed    Call MD for:  temperature >100.4   Complete by: As directed     Diet Carb Modified  Complete by: As directed    Discharge instructions   Complete by: As directed    Please take your medications as prescribed.  Please follow instructions provided by your gastroenterologist.  You were cared for by a hospitalist during your hospital stay. If you have any questions about your discharge medications or the care you received while you were in the hospital after you are discharged, you can call the unit and asked to speak with the hospitalist on call if the hospitalist that took care of you is not available. Once you are discharged, your primary care physician will handle any further medical issues. Please note that NO REFILLS for any discharge medications will be authorized once you are discharged, as it is imperative that you return to your primary care physician (or establish a relationship with a primary care physician if you do not have one) for your aftercare needs so that they can reassess your need for medications and monitor your lab values. If you do not have a primary care physician, you can call [phone] for a physician referral.   Increase activity slowly   Complete by: As directed         Allergies as of 03/14/2021   No Known Allergies      Medication List     STOP taking these medications    ondansetron 8 MG disintegrating tablet Commonly known as: Zofran ODT   oxyCODONE 5 MG immediate release tablet Commonly known as: Oxy IR/ROXICODONE       TAKE these medications    amoxicillin-clavulanate 875-125 MG tablet Commonly known as: AUGMENTIN Take 1 tablet by mouth every 12 (twelve) hours for 7 days.   metFORMIN 500 MG 24 hr tablet Commonly known as: GLUCOPHAGE-XR Take 500 mg by mouth in the morning and at bedtime.   pantoprazole 40 MG tablet Commonly known as: Protonix Take 1 tablet (40 mg total) by mouth daily.          Follow-up Information     Rehman, Mechele Dawley, MD Follow up.   Specialty: Gastroenterology Why: As  needed Contact information: [address], [address] [place] [address] [phone]                 TOTAL DISCHARGE TIME: 51 minutes  [place]  [facility] Pager on [email or web address]  03/15/2021, 2:39 PM

## 2021-03-16 LAB — CULTURE, BLOOD (ROUTINE X 2)
Culture: NO GROWTH
Culture: NO GROWTH
Special Requests: ADEQUATE
Special Requests: ADEQUATE

## 2021-03-17 ENCOUNTER — Other Ambulatory Visit: Payer: Commercial Managed Care - PPO

## 2021-03-17 ENCOUNTER — Encounter: Payer: Commercial Managed Care - PPO | Admitting: Licensed Clinical Social Worker

## 2021-03-17 NOTE — Telephone Encounter (Signed)
Discharged on 8/5

## 2021-03-18 ENCOUNTER — Other Ambulatory Visit (INDEPENDENT_AMBULATORY_CARE_PROVIDER_SITE_OTHER): Payer: Self-pay | Admitting: Internal Medicine

## 2021-03-18 ENCOUNTER — Telehealth (INDEPENDENT_AMBULATORY_CARE_PROVIDER_SITE_OTHER): Payer: Self-pay | Admitting: Internal Medicine

## 2021-03-18 MED ORDER — FERROUS SULFATE 325 (65 FE) MG PO TABS: 325.0000 mg | ORAL_TABLET | Freq: Every day | ORAL | Status: DC

## 2021-03-18 NOTE — Telephone Encounter (Signed)
I contacted patient's wife with results of pending blood work. B12 level is normal Serum iron 27 TIBC 154 and saturation 17%. Serum ferritin 708.Serum 708  Serum ferritin is elevated because of liver injury secondary to obstruction. Low serum iron and TIBC possibly indicate chronic disease anemia but will treat him with ferrous sulfate 325 mg daily with breakfast. Patient's wife indicated that when he has constipation.  He will take polyethylene glycol 17 g by mouth daily.

## 2021-03-19 ENCOUNTER — Ambulatory Visit (HOSPITAL_COMMUNITY): Payer: Commercial Managed Care - PPO | Admitting: Hematology

## 2021-03-21 ENCOUNTER — Ambulatory Visit (HOSPITAL_COMMUNITY): Payer: Commercial Managed Care - PPO | Admitting: Hematology and Oncology

## 2021-03-21 NOTE — Telephone Encounter (Signed)
This encounter was created in error - please disregard.

## 2021-03-27 ENCOUNTER — Ambulatory Visit (HOSPITAL_COMMUNITY): Payer: Commercial Managed Care - PPO | Admitting: Hematology

## 2021-04-10 DIAGNOSIS — C221 Intrahepatic bile duct carcinoma: Secondary | ICD-10-CM

## 2021-04-10 DIAGNOSIS — K8050 Calculus of bile duct without cholangitis or cholecystitis without obstruction: Secondary | ICD-10-CM | POA: Insufficient documentation

## 2021-04-28 ENCOUNTER — Emergency Department (HOSPITAL_COMMUNITY): Payer: Commercial Managed Care - PPO

## 2021-04-28 ENCOUNTER — Encounter (HOSPITAL_COMMUNITY): Payer: Self-pay | Admitting: *Deleted

## 2021-04-28 ENCOUNTER — Other Ambulatory Visit: Payer: Self-pay

## 2021-04-28 ENCOUNTER — Inpatient Hospital Stay (HOSPITAL_COMMUNITY)
Admission: EM | Admit: 2021-04-28 | Discharge: 2021-05-01 | DRG: 872 | Disposition: A | Discharge status: Home / Self Care | Payer: Commercial Managed Care - PPO | Attending: Internal Medicine | Admitting: Internal Medicine

## 2021-04-28 DIAGNOSIS — E1165 Type 2 diabetes mellitus with hyperglycemia: Secondary | ICD-10-CM | POA: Diagnosis present

## 2021-04-28 DIAGNOSIS — I10 Essential (primary) hypertension: Secondary | ICD-10-CM | POA: Diagnosis present

## 2021-04-28 DIAGNOSIS — D649 Anemia, unspecified: Secondary | ICD-10-CM | POA: Diagnosis present

## 2021-04-28 DIAGNOSIS — E871 Hypo-osmolality and hyponatremia: Secondary | ICD-10-CM | POA: Diagnosis present

## 2021-04-28 DIAGNOSIS — R7881 Bacteremia: Secondary | ICD-10-CM | POA: Diagnosis not present

## 2021-04-28 DIAGNOSIS — F17210 Nicotine dependence, cigarettes, uncomplicated: Secondary | ICD-10-CM | POA: Diagnosis present

## 2021-04-28 DIAGNOSIS — R6510 Systemic inflammatory response syndrome (SIRS) of non-infectious origin without acute organ dysfunction: Secondary | ICD-10-CM | POA: Diagnosis present

## 2021-04-28 DIAGNOSIS — M5030 Other cervical disc degeneration, unspecified cervical region: Secondary | ICD-10-CM | POA: Diagnosis present

## 2021-04-28 DIAGNOSIS — E118 Type 2 diabetes mellitus with unspecified complications: Secondary | ICD-10-CM

## 2021-04-28 DIAGNOSIS — C221 Intrahepatic bile duct carcinoma: Secondary | ICD-10-CM | POA: Diagnosis present

## 2021-04-28 DIAGNOSIS — E785 Hyperlipidemia, unspecified: Secondary | ICD-10-CM | POA: Diagnosis present

## 2021-04-28 DIAGNOSIS — Z20822 Contact with and (suspected) exposure to covid-19: Secondary | ICD-10-CM | POA: Diagnosis present

## 2021-04-28 DIAGNOSIS — Z79899 Other long term (current) drug therapy: Secondary | ICD-10-CM

## 2021-04-28 DIAGNOSIS — B965 Pseudomonas (aeruginosa) (mallei) (pseudomallei) as the cause of diseases classified elsewhere: Secondary | ICD-10-CM | POA: Diagnosis not present

## 2021-04-28 DIAGNOSIS — A419 Sepsis, unspecified organism: Secondary | ICD-10-CM

## 2021-04-28 DIAGNOSIS — G8929 Other chronic pain: Secondary | ICD-10-CM | POA: Diagnosis present

## 2021-04-28 DIAGNOSIS — Z7984 Long term (current) use of oral hypoglycemic drugs: Secondary | ICD-10-CM | POA: Diagnosis not present

## 2021-04-28 DIAGNOSIS — A4152 Sepsis due to Pseudomonas: Secondary | ICD-10-CM | POA: Diagnosis not present

## 2021-04-28 DIAGNOSIS — E119 Type 2 diabetes mellitus without complications: Secondary | ICD-10-CM

## 2021-04-28 LAB — URINALYSIS, ROUTINE W REFLEX MICROSCOPIC
Bilirubin Urine: NEGATIVE
Glucose, UA: NEGATIVE mg/dL
Hgb urine dipstick: NEGATIVE
Ketones, ur: NEGATIVE mg/dL
Leukocytes,Ua: NEGATIVE
Nitrite: NEGATIVE
Protein, ur: NEGATIVE mg/dL
Specific Gravity, Urine: 1.045 — ABNORMAL HIGH (ref 1.005–1.030)
pH: 5 (ref 5.0–8.0)

## 2021-04-28 LAB — CBC WITH DIFFERENTIAL/PLATELET
Abs Immature Granulocytes: 0.04 10*3/uL (ref 0.00–0.07)
Basophils Absolute: 0.1 10*3/uL (ref 0.0–0.1)
Basophils Relative: 0 %
Eosinophils Absolute: 0 10*3/uL (ref 0.0–0.5)
Eosinophils Relative: 0 %
HCT: 38.5 % — ABNORMAL LOW (ref 39.0–52.0)
Hemoglobin: 12.6 g/dL — ABNORMAL LOW (ref 13.0–17.0)
Immature Granulocytes: 0 %
Lymphocytes Relative: 10 %
Lymphs Abs: 1.2 10*3/uL (ref 0.7–4.0)
MCH: 30.8 pg (ref 26.0–34.0)
MCHC: 32.7 g/dL (ref 30.0–36.0)
MCV: 94.1 fL (ref 80.0–100.0)
Monocytes Absolute: 1 10*3/uL (ref 0.1–1.0)
Monocytes Relative: 9 %
Neutro Abs: 9.7 10*3/uL — ABNORMAL HIGH (ref 1.7–7.7)
Neutrophils Relative %: 81 %
Platelets: 274 10*3/uL (ref 150–400)
RBC: 4.09 MIL/uL — ABNORMAL LOW (ref 4.22–5.81)
RDW: 13.4 % (ref 11.5–15.5)
WBC: 12.1 10*3/uL — ABNORMAL HIGH (ref 4.0–10.5)
nRBC: 0 % (ref 0.0–0.2)

## 2021-04-28 LAB — LIPASE, BLOOD: Lipase: 54 U/L — ABNORMAL HIGH (ref 11–51)

## 2021-04-28 LAB — COMPREHENSIVE METABOLIC PANEL
ALT: 42 U/L (ref 0–44)
AST: 40 U/L (ref 15–41)
Albumin: 3 g/dL — ABNORMAL LOW (ref 3.5–5.0)
Alkaline Phosphatase: 271 U/L — ABNORMAL HIGH (ref 38–126)
Anion gap: 9 (ref 5–15)
BUN: 16 mg/dL (ref 8–23)
CO2: 19 mmol/L — ABNORMAL LOW (ref 22–32)
Calcium: 8.5 mg/dL — ABNORMAL LOW (ref 8.9–10.3)
Chloride: 101 mmol/L (ref 98–111)
Creatinine, Ser: 0.94 mg/dL (ref 0.61–1.24)
GFR, Estimated: >60 mL/min (ref >60)
Glucose, Bld: 140 mg/dL — ABNORMAL HIGH (ref 70–99)
Potassium: 4.4 mmol/L (ref 3.5–5.1)
Sodium: 129 mmol/L — ABNORMAL LOW (ref 135–145)
Total Bilirubin: 1.1 mg/dL (ref 0.3–1.2)
Total Protein: 7.7 g/dL (ref 6.5–8.1)

## 2021-04-28 LAB — APTT: aPTT: 30 seconds (ref 24–36)

## 2021-04-28 LAB — PROTIME-INR
INR: 1.1 (ref 0.8–1.2)
Prothrombin Time: 13.7 seconds (ref 11.4–15.2)

## 2021-04-28 LAB — RESP PANEL BY RT-PCR (FLU A&B, COVID) ARPGX2
Influenza A by PCR: NEGATIVE
Influenza B by PCR: NEGATIVE
SARS Coronavirus 2 by RT PCR: NEGATIVE

## 2021-04-28 LAB — LACTIC ACID, PLASMA: Lactic Acid, Venous: 1.2 mmol/L (ref 0.5–1.9)

## 2021-04-28 LAB — PROCALCITONIN: Procalcitonin: 0.32 ng/mL

## 2021-04-28 LAB — GLUCOSE, CAPILLARY: Glucose-Capillary: 114 mg/dL — ABNORMAL HIGH (ref 70–99)

## 2021-04-28 MED ORDER — LACTATED RINGERS IV BOLUS (SEPSIS)
500.0000 mL | Freq: Once | INTRAVENOUS | Status: AC
  Administered 2021-04-28: 500 mL via INTRAVENOUS

## 2021-04-28 MED ORDER — INSULIN ASPART 100 UNIT/ML IJ SOLN: 0.0000 [IU] | Freq: Every day | INTRAMUSCULAR | Status: DC

## 2021-04-28 MED ORDER — METRONIDAZOLE 500 MG/100ML IV SOLN
500.0000 mg | Freq: Two times a day (BID) | INTRAVENOUS | Status: DC
  Administered 2021-04-28 – 2021-04-29 (×2): 500 mg via INTRAVENOUS
  Filled 2021-04-28 (×2): qty 100

## 2021-04-28 MED ORDER — VANCOMYCIN HCL IN DEXTROSE 1-5 GM/200ML-% IV SOLN
1000.0000 mg | Freq: Two times a day (BID) | INTRAVENOUS | Status: DC
  Administered 2021-04-29 (×2): 1000 mg via INTRAVENOUS
  Filled 2021-04-28 (×2): qty 200

## 2021-04-28 MED ORDER — SODIUM CHLORIDE 0.9 % IV SOLN
2.0000 g | Freq: Three times a day (TID) | INTRAVENOUS | Status: DC
  Administered 2021-04-28 – 2021-05-01 (×8): 2 g via INTRAVENOUS
  Filled 2021-04-28 (×8): qty 2

## 2021-04-28 MED ORDER — SODIUM CHLORIDE 0.9 % IV SOLN
2.0000 g | Freq: Once | INTRAVENOUS | Status: AC
  Administered 2021-04-28: 2 g via INTRAVENOUS
  Filled 2021-04-28: qty 2

## 2021-04-28 MED ORDER — POLYETHYLENE GLYCOL 3350 17 G PO PACK: 17.0000 g | PACK | Freq: Every day | ORAL | Status: DC | PRN

## 2021-04-28 MED ORDER — ACETAMINOPHEN 325 MG PO TABS
650.0000 mg | ORAL_TABLET | Freq: Once | ORAL | Status: AC
  Administered 2021-04-28: 650 mg via ORAL
  Filled 2021-04-28: qty 2

## 2021-04-28 MED ORDER — METRONIDAZOLE 500 MG/100ML IV SOLN
500.0000 mg | Freq: Once | INTRAVENOUS | Status: AC
  Administered 2021-04-28: 500 mg via INTRAVENOUS
  Filled 2021-04-28: qty 100

## 2021-04-28 MED ORDER — ACETAMINOPHEN 325 MG PO TABS
650.0000 mg | ORAL_TABLET | Freq: Three times a day (TID) | ORAL | Status: DC
  Administered 2021-04-28 – 2021-05-01 (×9): 650 mg via ORAL
  Filled 2021-04-28 (×10): qty 2

## 2021-04-28 MED ORDER — VANCOMYCIN HCL IN DEXTROSE 1-5 GM/200ML-% IV SOLN: 1000.0000 mg | Freq: Once | INTRAVENOUS | Status: DC

## 2021-04-28 MED ORDER — ONDANSETRON HCL 4 MG PO TABS
4.0000 mg | ORAL_TABLET | Freq: Four times a day (QID) | ORAL | Status: DC | PRN
  Administered 2021-04-30: 4 mg via ORAL
  Filled 2021-04-28: qty 1

## 2021-04-28 MED ORDER — PANTOPRAZOLE SODIUM 40 MG PO TBEC
40.0000 mg | DELAYED_RELEASE_TABLET | Freq: Every day | ORAL | Status: DC
  Administered 2021-04-28 – 2021-05-01 (×4): 40 mg via ORAL
  Filled 2021-04-28 (×4): qty 1

## 2021-04-28 MED ORDER — LACTATED RINGERS IV SOLN: INTRAVENOUS | Status: DC

## 2021-04-28 MED ORDER — ACETAMINOPHEN 650 MG RE SUPP: 650.0000 mg | Freq: Three times a day (TID) | RECTAL | Status: DC

## 2021-04-28 MED ORDER — VANCOMYCIN HCL 1500 MG/300ML IV SOLN
1500.0000 mg | Freq: Once | INTRAVENOUS | Status: AC
  Administered 2021-04-28: 1500 mg via INTRAVENOUS
  Filled 2021-04-28: qty 300

## 2021-04-28 MED ORDER — IOHEXOL 350 MG/ML SOLN
100.0000 mL | Freq: Once | INTRAVENOUS | Status: AC | PRN
  Administered 2021-04-28: 80 mL via INTRAVENOUS

## 2021-04-28 MED ORDER — LACTATED RINGERS IV BOLUS (SEPSIS)
1000.0000 mL | Freq: Once | INTRAVENOUS | Status: AC
  Administered 2021-04-28: 1000 mL via INTRAVENOUS

## 2021-04-28 MED ORDER — ONDANSETRON HCL 4 MG/2ML IJ SOLN: 4.0000 mg | Freq: Four times a day (QID) | INTRAMUSCULAR | Status: DC | PRN

## 2021-04-28 MED ORDER — ENOXAPARIN SODIUM 40 MG/0.4ML IJ SOSY
40.0000 mg | PREFILLED_SYRINGE | INTRAMUSCULAR | Status: DC
  Administered 2021-04-28 – 2021-04-30 (×3): 40 mg via SUBCUTANEOUS
  Filled 2021-04-28 (×3): qty 0.4

## 2021-04-28 MED ORDER — SODIUM CHLORIDE 0.9 % IV SOLN: INTRAVENOUS | Status: AC

## 2021-04-28 MED ORDER — ACETAMINOPHEN 500 MG PO TABS
1000.0000 mg | ORAL_TABLET | Freq: Once | ORAL | Status: AC
  Administered 2021-04-28: 1000 mg via ORAL
  Filled 2021-04-28: qty 2

## 2021-04-28 MED ORDER — INSULIN ASPART 100 UNIT/ML IJ SOLN
0.0000 [IU] | Freq: Three times a day (TID) | INTRAMUSCULAR | Status: DC
Start: 2021-04-29 — End: 2021-05-01
  Administered 2021-04-29 (×2): 2 [IU] via SUBCUTANEOUS
  Administered 2021-04-29: 1 [IU] via SUBCUTANEOUS
  Administered 2021-04-30: 2 [IU] via SUBCUTANEOUS
  Administered 2021-04-30 – 2021-05-01 (×2): 1 [IU] via SUBCUTANEOUS

## 2021-04-28 MED ORDER — SODIUM CHLORIDE 0.9 % IV SOLN: 2.0000 g | Freq: Three times a day (TID) | INTRAVENOUS | Status: DC

## 2021-04-28 NOTE — ED Provider Notes (Signed)
[facility] EMERGENCY DEPARTMENT Provider Note  CSN: [id number] Arrival date & time: 04/28/21 1118    History Chief Complaint  Patient presents with   Post-op Problem    Jonathan Keith is a 65 y.o. male with recent history of perihilar cholangiocarcinoma, [facility] clinic for management. He has had several recent procedures there. From IR office visit 9/7: "ERCP done 02/25/2021 with findings of 2 high-grade biliary strictures for which to stent were placed followed by repeat ERCP done 02/27/2021 with a 10 French 9 cm stent placed across the stricture into the right hepatic system. For pre-surgical planning, patient scheduled to undergo right portal vein embolization to be done tomorrow, 04/17/2021 in [facility] per Dr. Raul Del." He had an additional procedure done last which which is not available for review in Epic. He returned home 2 days ago, has had some cough since then but began having chills this morning and noted to have a fever and tachycardia. He reports his wife spoke with his surgeon and recommended he come to the ED for evaluation. He has not had any abdominal pain, nausea, vomiting or diarrhea. No dysuria. No CP or SOB.    Past Medical History:  Diagnosis Date   Chronic pain    neck, back, knees   Class 1 obesity due to excess calories with body mass index (BMI) of 31.0 to 31.9 in adult    Claudication [facility])    DDD (degenerative disc disease), cervical    Diabetes mellitus ([place])    Dyslipidemia    Hypertension    Tobacco dependence     Past Surgical History:  Procedure Laterality Date   BILIARY BRUSHING N/A 02/25/2021   Procedure: BILIARY BRUSHING;  Surgeon: Rogene Houston, MD;  Location: [facility];  Service: Gastroenterology;  Laterality: N/A;   BILIARY STENT PLACEMENT N/A 02/25/2021   Procedure: BILIARY STENT PLACEMENT;  Surgeon: Rogene Houston, MD;  Location: [facility];  Service: Gastroenterology;  Laterality: N/A;   BILIARY STENT PLACEMENT  02/27/2021    Procedure: BILIARY STENT PLACEMENT (10FR x 9cm) IN THE RIGHT SYSTEM;  Surgeon: Rogene Houston, MD;  Location: [facility];  Service: Gastroenterology;;   BREAST SURGERY Left    benign lump- in his 32s.   ERCP N/A 02/25/2021   Procedure: ENDOSCOPIC RETROGRADE CHOLANGIOPANCREATOGRAPHY (ERCP);  Surgeon: Rogene Houston, MD;  Location: [facility];  Service: Gastroenterology;  Laterality: N/A;   ERCP N/A 02/27/2021   Procedure: ENDOSCOPIC RETROGRADE CHOLANGIOPANCREATOGRAPHY (ERCP);  Surgeon: Rogene Houston, MD;  Location: [facility];  Service: Gastroenterology;  Laterality: N/A;   KNEE SURGERY Left    x2   SPHINCTEROTOMY N/A 02/25/2021   Procedure: SPHINCTEROTOMY;  Surgeon: Rogene Houston, MD;  Location: [facility];  Service: Gastroenterology;  Laterality: N/A;   STENT REMOVAL  02/27/2021   Procedure: STENT REMOVAL (8.5Fr x 9cm);  Surgeon: Rogene Houston, MD;  Location: [facility];  Service: Gastroenterology;;    Family History  Problem Relation Age of Onset   CAD Mother    Cancer Neg Hx     Social History   Tobacco Use   Smoking status: Every Day    Packs/day: 0.50    Years: 56.00    Pack years: 28.00    Types: Cigarettes   Smokeless tobacco: Never  Vaping Use   Vaping Use: Never used  Substance Use Topics   Alcohol use: Not Currently    Comment: stopped 2.5 years ago 03/11/21   Drug use: Never  Home Medications Prior to Admission medications   Medication Sig Start Date End Date Taking? Authorizing Provider  doxycycline (ADOXA) 100 MG tablet Take 100 mg by mouth 2 (two) times daily. 04/23/21  Yes [provider]  ferrous sulfate (FERROUSUL) 325 (65 FE) MG tablet Take 1 tablet (325 mg total) by mouth daily with breakfast. 03/18/21  Yes Rehman, Mechele Dawley, MD  metFORMIN (GLUCOPHAGE) 500 MG tablet Take 1 tablet by mouth 2 (two) times daily with a meal. 03/10/21  Yes [provider]  pantoprazole (PROTONIX) 40 MG tablet Take 1 tablet (40 mg total) by mouth daily. 02/28/21 02/28/22 Yes  Barton Dubois, MD     Allergies    Patient has no known allergies.   Review of Systems   Review of Systems A comprehensive review of systems was completed and negative except as noted in HPI.    Physical Exam BP (!) 151/81   Pulse 94   Temp (!) 104.2 F (40.1 C) (Rectal)   Resp (!) 22   Ht 6' (1.829 m)   Wt 80.6 kg   SpO2 94%   BMI 24.10 kg/m   Physical Exam Vitals and nursing note reviewed.  Constitutional:      Appearance: Normal appearance.  HENT:     Head: Normocephalic and atraumatic.     Nose: Nose normal.     Mouth/Throat:     Mouth: Mucous membranes are moist.  Eyes:     Extraocular Movements: Extraocular movements intact.     Conjunctiva/sclera: Conjunctivae normal.  Cardiovascular:     Rate and Rhythm: Tachycardia present.  Pulmonary:     Effort: Pulmonary effort is normal.     Breath sounds: Normal breath sounds.  Abdominal:     General: Abdomen is flat.     Palpations: Abdomen is soft.     Tenderness: There is no abdominal tenderness. There is no guarding.     Comments: Reducible umbilical hernia  Musculoskeletal:        General: No swelling. Normal range of motion.     Cervical back: Neck supple.  Skin:    General: Skin is warm and dry.  Neurological:     General: No focal deficit present.     Mental Status: He is alert.  Psychiatric:        Mood and Affect: Mood normal.     ED Results / Procedures / Treatments   Labs (all labs ordered are listed, but only abnormal results are displayed) Labs Reviewed  COMPREHENSIVE METABOLIC PANEL - Abnormal; Notable for the following components:      Result Value   Sodium 129 (*)    CO2 19 (*)    Glucose, Bld 140 (*)    Calcium 8.5 (*)    Albumin 3.0 (*)    Alkaline Phosphatase 271 (*)    All other components within normal limits  CBC WITH DIFFERENTIAL/PLATELET - Abnormal; Notable for the following components:   WBC 12.1 (*)    RBC 4.09 (*)    Hemoglobin 12.6 (*)    HCT 38.5 (*)    Neutro  Abs 9.7 (*)    All other components within normal limits  LIPASE, BLOOD - Abnormal; Notable for the following components:   Lipase 54 (*)    All other components within normal limits  RESP PANEL BY RT-PCR (FLU A&B, COVID) ARPGX2  CULTURE, BLOOD (ROUTINE X 2)  CULTURE, BLOOD (ROUTINE X 2)  URINE CULTURE  LACTIC ACID, PLASMA  PROTIME-INR  APTT  URINALYSIS, ROUTINE W REFLEX MICROSCOPIC    EKG EKG Interpretation  Date/Time:  Monday April 28 2021 12:46:19 EDT Ventricular Rate:  104 PR Interval:  151 QRS Duration: 86 QT Interval:  331 QTC Calculation: 436 R Axis:   -56 Text Interpretation: Sinus tachycardia LAD, consider left anterior fascicular block Since last tracing Rate faster Confirmed by Calvert Cantor [phone]) on 04/28/2021 12:59:48 PM  Radiology CT Abdomen Pelvis W Contrast  Result Date: 04/28/2021 CLINICAL DATA:  Abdominal pain, fever, post-op EXAM: CT ABDOMEN AND PELVIS WITH CONTRAST TECHNIQUE: Multidetector CT imaging of the abdomen and pelvis was performed using the standard protocol following bolus administration of intravenous contrast. CONTRAST:  64m OMNIPAQUE IOHEXOL 350 MG/ML SOLN COMPARISON:  CT March 12, 2021 FINDINGS: Inferior chest: The lung bases are well-aerated. Hepatobiliary: There is dense metallic artifact which obscures evaluation of most of the hepatic parenchyma. Scattered dilated intrahepatic biliary ducts are again seen, left-greater-than-right. Bilateral biliary stents appear appropriately positioned. Stable distention of the gallbladder without pericholecystic inflammatory changes. Spleen: Normal in size without focal abnormality. Pancreas: No pancreatic ductal dilatation or surrounding inflammatory changes. Adrenals/Urinary Tract: Adrenal glands are unremarkable. Kidneys are normal, without renal calculi, focal lesion, or hydronephrosis. Bladder is unremarkable. Stomach/Bowel: The stomach, small bowel and large bowel are normal in caliber without  abnormal wall thickening or surrounding inflammatory changes. The appendix is normal. Reproductive: Prostate is unremarkable. Lymphatic: No enlarged lymph nodes in the abdomen or pelvis. Vasculature: The abdominal aorta is normal in caliber. Scattered fibrofatty and calcific atherosclerosis. The main and left portal veins are patent. The right portal vein is obscured. Other: No abdominopelvic ascites. Musculoskeletal: Multilevel degenerative changes particularly of the lower lumbar spine. The soft tissues are unremarkable. IMPRESSION: No new acute process identified within the abdomen or pelvis. There is new metallic density material within the right hepatic lobe presumably related to recent surgery, which obscures much of its evaluation. However, no new intrahepatic focal process identified within the limits of the exam. Stable appearance of scattered dilated intrahepatic biliary ducts. Bilateral biliary stents are in place, which appear grossly well positioned. Stable distention of the gallbladder without pericholecystic inflammatory changes. Electronically Signed   By: YAlbin FellingM.D.   On: 04/28/2021 14:49   DG Chest Port 1 View  Result Date: 04/28/2021 CLINICAL DATA:  Weakness, possible sepsis EXAM: PORTABLE CHEST 1 VIEW COMPARISON:  03/11/2021 FINDINGS: The heart size and mediastinal contours are within normal limits. Both lungs are clear. The visualized skeletal structures are unremarkable. IMPRESSION: No active disease. Electronically Signed   By: NDavina PokeD.O.   On: 04/28/2021 13:23    Procedures Procedures  Medications Ordered in the ED Medications  lactated ringers infusion ( Intravenous New Bag/Given 04/28/21 1450)  vancomycin (VANCOCIN) IVPB 1000 mg/200 mL premix (has no administration in time range)  ceFEPIme (MAXIPIME) 2 g in sodium chloride 0.9 % 100 mL IVPB (has no administration in time range)  lactated ringers bolus 1,000 mL (1,000 mLs Intravenous New Bag/Given 04/28/21  1238)    And  lactated ringers bolus 1,000 mL (1,000 mLs Intravenous New Bag/Given 04/28/21 1302)    And  lactated ringers bolus 500 mL (500 mLs Intravenous New Bag/Given 04/28/21 1348)  ceFEPIme (MAXIPIME) 2 g in sodium chloride 0.9 % 100 mL IVPB (0 g Intravenous Stopped 04/28/21 1344)  metroNIDAZOLE (FLAGYL) IVPB 500 mg (0 mg Intravenous Stopped 04/28/21 1345)  acetaminophen (TYLENOL) tablet 650 mg (650 mg Oral Given 04/28/21 1309)  vancomycin (VANCOREADY) IVPB 1500 mg/300 mL (1,500  mg Intravenous New Bag/Given 04/28/21 1327)  iohexol (OMNIPAQUE) 350 MG/ML injection 100 mL (80 mLs Intravenous Contrast Given 04/28/21 1404)     MDM Rules/Calculators/A&P MDM Patient with fever and tachycardia on arrival. No definite source by exam or history but has had several recent procedures on his biliary system. Will initiate sepsis order set, including LR bolus, broad spectrum Abx and Covid swab.   ED Course  I have reviewed the triage vital signs and the nursing notes.  Pertinent labs & imaging results that were available during my care of the patient were reviewed by me and considered in my medical decision making (see chart for details).  Clinical Course as of 04/28/21 1529  Mon Apr 28, 2021  1239 Wife at bedside now clarifies recent history. He has not been back to [facility] since his IR procedure on 9/8. He actually returned home 9 days ago (not two days). He had been having some low grade temps and night sweats at home. She took him to PCP who started him on an unknown oral antibiotic without any additional testing which apparently caused him some chest pains so they switched him to doxycycline which he has been taking for 2 days.  [CS]  1251 Spoke with Dr. Bing Ree, at [facility], who recommends a CT abd/pel to evaluate for liver abscess or cholangitis.  [CS]  [id number] CBC with mildly elevated WBC and anemia. HR is improving.  [CS]  1329 Lactic acid is normal.  [CS]  [id number] CXR is clear [CS]  [id number] CMP with mild  hyponatremia, lipase only mildly elevated.  [CS]  19 Covid is neg.  [CS]  13 Spoke with Dr. Arlyce Dice, Hospitalist who will evaluate for admission. [CS]    Clinical Course User Index [CS] Truddie Hidden, MD    Final Clinical Impression(s) / ED Diagnoses Final diagnoses:  Sepsis without acute organ dysfunction, due to unspecified organism [facility])    Rx / DC Orders ED Discharge Orders     None        Truddie Hidden, MD 04/28/21 1529

## 2021-04-28 NOTE — ED Notes (Signed)
Gave pt urnial, call light, and phone.

## 2021-04-28 NOTE — Sepsis Progress Note (Signed)
Code sepsis protocol being monitored by eLink. 

## 2021-04-28 NOTE — H&P (Signed)
History and Physical    Jonathan Keith [id number] DOB: [birth year] DOA: 04/28/2021  PCP: [name], PA-C   Patient coming from: Home  I have personally briefly reviewed patient's old medical records in [place]  Chief Complaint: Fever  HPI: Jonathan Keith is a 65 y.o. male with medical history significant for cholangiocarcinoma, DM, HTN.  Patient presented to the ED with complaints of fevers and chills for about 1 week duration.  Patient went to see his primary care provider, was prescribed an antibiotic, he did not tolerate because it as it caused some chest pains, he was switched to doxycycline which he had taken for just 2 days.   Patient reports headache, he has chronic unchanged neck pain, no vomiting no loose stools no abdominal pain.  Has had a mild dry cough over the past few months that started after he had EGD done.  No difficulty breathing no wounds or redness of extremity, no pain with urination.  Recent hospitalization 8/2 - 8/5 also with SIRS- thought secondary to colitis versus sigmoid diverticulitis.  Was placed on Zosyn and discharged on Augmentin.  He has a recent diagnosis of cholangiocarcinoma, combined history from epic, Care Everywhere (full documentation of course not available for review ) and patient report-  patient had stents placed here by Dr. Laural Golden, he was subsequently referred to [facility], had another stent placed there. 9/8 patient then had portal vein embolization done to encourage hypertrophy of the left liver lobe, with plans for extended right hepatectomy, proposed with 25th-26 October.  He is supposed to be at [facility] October 28.   Spouse reports that after every procedure patient has had, now for procedures, he has subsequently developed fevers, and requiring antibiotics.  She is worried patient may be reacting to the stents as fever started after the first stent was placed.  Per spouse he has now had 4 procedures.  ED Course:  Febrile to 104.2.  Tachycardic 93-121, respiratory rate 17-28, blood pressure systolic 1 [id number].  O2 sat is good to 93% on room air.  WBC 12.1.  Sodium 129.  Lipase 54.  Elevated ALP at 271 stable, otherwise normal liver enzymes.  COVID test negative EDP talked to [place] at [facility] who recommended CT abdomen and pelvis, to evaluate for liver abscess or cholangitis and if CT unrevealing to treat possible treat any fever.   Blood and urine cultures obtained Broad-spectrum antibiotics Vanco cefepime and metronidazole started.  Review of Systems: As per HPI all other systems reviewed and negative.  Past Medical History:  Diagnosis Date   Chronic pain    neck, back, knees   Class 1 obesity due to excess calories with body mass index (BMI) of 31.0 to 31.9 in adult    Claudication [facility])    DDD (degenerative disc disease), cervical    Diabetes mellitus ([place])    Dyslipidemia    Hypertension    Tobacco dependence     Past Surgical History:  Procedure Laterality Date   BILIARY BRUSHING N/A 02/25/2021   Procedure: BILIARY BRUSHING;  Surgeon: Rogene Houston, MD;  Location: [facility];  Service: Gastroenterology;  Laterality: N/A;   BILIARY STENT PLACEMENT N/A 02/25/2021   Procedure: BILIARY STENT PLACEMENT;  Surgeon: Rogene Houston, MD;  Location: [facility];  Service: Gastroenterology;  Laterality: N/A;   BILIARY STENT PLACEMENT  02/27/2021   Procedure: BILIARY STENT PLACEMENT (10FR x 9cm) IN THE RIGHT SYSTEM;  Surgeon: Rogene Houston, MD;  Location: [facility];  Service: Gastroenterology;;   BREAST SURGERY Left    benign lump- in his 15s.   ERCP N/A 02/25/2021   Procedure: ENDOSCOPIC RETROGRADE CHOLANGIOPANCREATOGRAPHY (ERCP);  Surgeon: Rogene Houston, MD;  Location: [facility];  Service: Gastroenterology;  Laterality: N/A;   ERCP N/A 02/27/2021   Procedure: ENDOSCOPIC RETROGRADE CHOLANGIOPANCREATOGRAPHY (ERCP);  Surgeon: Rogene Houston, MD;  Location: [facility];  Service: Gastroenterology;  Laterality:  N/A;   KNEE SURGERY Left    x2   SPHINCTEROTOMY N/A 02/25/2021   Procedure: SPHINCTEROTOMY;  Surgeon: Rogene Houston, MD;  Location: [facility];  Service: Gastroenterology;  Laterality: N/A;   STENT REMOVAL  02/27/2021   Procedure: STENT REMOVAL (8.5Fr x 9cm);  Surgeon: Rogene Houston, MD;  Location: [facility];  Service: Gastroenterology;;     reports that he has been smoking cigarettes. He has a 28.00 pack-year smoking history. He has never used smokeless tobacco. He reports that he does not currently use alcohol. He reports that he does not use drugs.  No Known Allergies  Family History  Problem Relation Age of Onset   CAD Mother    Cancer Neg Hx    Prior to Admission medications   Medication Sig Start Date End Date Taking? Authorizing Provider  doxycycline (ADOXA) 100 MG tablet Take 100 mg by mouth 2 (two) times daily. 04/23/21  Yes [provider]  ferrous sulfate (FERROUSUL) 325 (65 FE) MG tablet Take 1 tablet (325 mg total) by mouth daily with breakfast. 03/18/21  Yes Rehman, Mechele Dawley, MD  metFORMIN (GLUCOPHAGE) 500 MG tablet Take 1 tablet by mouth 2 (two) times daily with a meal. 03/10/21  Yes [provider]  pantoprazole (PROTONIX) 40 MG tablet Take 1 tablet (40 mg total) by mouth daily. 02/28/21 02/28/22 Yes Barton Dubois, MD    Physical Exam: Vitals:   04/28/21 1330 04/28/21 1407 04/28/21 1430 04/28/21 1500  BP: (!) 160/79 126/75 135/72 (!) 151/81  Pulse: (!) 105 (!) 101 93 94  Resp: (!) '28 18 18 '$ (!) 22  Temp:      TempSrc:      SpO2: 94% 95% 93% 94%  Weight:      Height:        Constitutional: Acutely ill-appearing, shaking chills,  Vitals:   04/28/21 1330 04/28/21 1407 04/28/21 1430 04/28/21 1500  BP: (!) 160/79 126/75 135/72 (!) 151/81  Pulse: (!) 105 (!) 101 93 94  Resp: (!) '28 18 18 '$ (!) 22  Temp:      TempSrc:      SpO2: 94% 95% 93% 94%  Weight:      Height:       Eyes: lids and conjunctivae normal ENMT: Mucous membranes are moist.  Neck:  normal, supple, no masses, no thyromegaly Respiratory: clear to auscultation bilaterally, no wheezing, no crackles. Normal respiratory effort. No accessory muscle use.  Cardiovascular: Tachycardic, regular rate and rhythm, no murmurs / rubs / gallops. No extremity edema. 2+ pedal pulses.  Abdomen: no tenderness, no masses palpated. No hepatosplenomegaly. Bowel sounds positive.  Musculoskeletal: no clubbing / cyanosis. No joint deformity upper and lower extremities. Good ROM, no contractures. Normal muscle tone.  Skin: no rashes, lesions, ulcers. No induration Neurologic: Cranial nerve abnormality, moving extremity spontaneously Psychiatric: Normal judgment and insight. Alert and oriented x 3. Normal mood.   Labs on Admission: I have personally reviewed following labs and imaging studies  CBC: Recent Labs  Lab 04/28/21 1243  WBC 12.1*  NEUTROABS 9.7*  HGB 12.6*  HCT 38.5*  MCV 94.1  PLT [id number]   Basic Metabolic Panel: Recent Labs  Lab 04/28/21 1243  NA 129*  K 4.4  CL 101  CO2 19*  GLUCOSE 140*  BUN 16  CREATININE 0.94  CALCIUM 8.5*   GFR: Estimated Creatinine Clearance: 86 mL/min (by C-G formula based on SCr of 0.94 mg/dL). Liver Function Tests: Recent Labs  Lab 04/28/21 1243  AST 40  ALT 42  ALKPHOS 271*  BILITOT 1.1  PROT 7.7  ALBUMIN 3.0*   Recent Labs  Lab 04/28/21 1243  LIPASE 54*   No results for input(s): AMMONIA in the last 168 hours. Coagulation Profile: Recent Labs  Lab 04/28/21 1243  INR 1.1   Urine analysis:    Component Value Date/Time   COLORURINE AMBER (A) 03/12/2021 0036   APPEARANCEUR CLEAR 03/12/2021 0036   LABSPEC 1.025 03/12/2021 0036   PHURINE 5.0 03/12/2021 0036   GLUCOSEU NEGATIVE 03/12/2021 0036   HGBUR NEGATIVE 03/12/2021 0036   BILIRUBINUR NEGATIVE 03/12/2021 0036   KETONESUR 5 (A) 03/12/2021 0036   PROTEINUR NEGATIVE 03/12/2021 0036   NITRITE NEGATIVE 03/12/2021 0036   LEUKOCYTESUR NEGATIVE 03/12/2021 0036     Radiological Exams on Admission: CT Abdomen Pelvis W Contrast  Result Date: 04/28/2021 CLINICAL DATA:  Abdominal pain, fever, post-op EXAM: CT ABDOMEN AND PELVIS WITH CONTRAST TECHNIQUE: Multidetector CT imaging of the abdomen and pelvis was performed using the standard protocol following bolus administration of intravenous contrast. CONTRAST:  51m OMNIPAQUE IOHEXOL 350 MG/ML SOLN COMPARISON:  CT March 12, 2021 FINDINGS: Inferior chest: The lung bases are well-aerated. Hepatobiliary: There is dense metallic artifact which obscures evaluation of most of the hepatic parenchyma. Scattered dilated intrahepatic biliary ducts are again seen, left-greater-than-right. Bilateral biliary stents appear appropriately positioned. Stable distention of the gallbladder without pericholecystic inflammatory changes. Spleen: Normal in size without focal abnormality. Pancreas: No pancreatic ductal dilatation or surrounding inflammatory changes. Adrenals/Urinary Tract: Adrenal glands are unremarkable. Kidneys are normal, without renal calculi, focal lesion, or hydronephrosis. Bladder is unremarkable. Stomach/Bowel: The stomach, small bowel and large bowel are normal in caliber without abnormal wall thickening or surrounding inflammatory changes. The appendix is normal. Reproductive: Prostate is unremarkable. Lymphatic: No enlarged lymph nodes in the abdomen or pelvis. Vasculature: The abdominal aorta is normal in caliber. Scattered fibrofatty and calcific atherosclerosis. The main and left portal veins are patent. The right portal vein is obscured. Other: No abdominopelvic ascites. Musculoskeletal: Multilevel degenerative changes particularly of the lower lumbar spine. The soft tissues are unremarkable. IMPRESSION: No new acute process identified within the abdomen or pelvis. There is new metallic density material within the right hepatic lobe presumably related to recent surgery, which obscures much of its evaluation.  However, no new intrahepatic focal process identified within the limits of the exam. Stable appearance of scattered dilated intrahepatic biliary ducts. Bilateral biliary stents are in place, which appear grossly well positioned. Stable distention of the gallbladder without pericholecystic inflammatory changes. Electronically Signed   By: YAlbin FellingM.D.   On: 04/28/2021 14:49   DG Chest Port 1 View  Result Date: 04/28/2021 CLINICAL DATA:  Weakness, possible sepsis EXAM: PORTABLE CHEST 1 VIEW COMPARISON:  03/11/2021 FINDINGS: The heart size and mediastinal contours are within normal limits. Both lungs are clear. The visualized skeletal structures are unremarkable. IMPRESSION: No active disease. Electronically Signed   By: NDavina PokeD.O.   On: 04/28/2021 13:23    EKG: Independently reviewed.  Tachycardic, rate 104, sinus.  QTc  436.  LAFB.  No significant change from prior.    Assessment/Plan Principal Problem:   SIRS (systemic inflammatory response syndrome) (HCC) Active Problems:   Diabetes mellitus (HCC)   Hypertension   Cholangiocarcinoma (HCC)   Hyponatremia    SIRS-high fevers to 104.2, tachycardic heart rate 93-131.  Leukocytosis 12.1.  Normal lactic acid 1.2.  At this time no focus of infection identified.  Chest x-ray clear, UA not suggestive, CT abdomen and pelvis with contrast-no new acute process identified, no new intrahepatic focal process identified, biliary stents appear grossly well-positioned.  Stable pericholecystic inflammatory changes. -Hospitalization a month ago for same, thought secondary to colitis versus diverticulitis.   -Continue broad-spectrum antibiotics with Vanco, cefepime and metronidazole -Check procalcitonin -2.5 L RL bolus given, continue N/s 100cc/hr  -CBC, CMP in the morning  Cholangiocarcinoma-stable liver enzymes, CT stable.  S/p stents, and now portal vein embolization to help with left hepatic lobe hypertrophy with plans for extended right  hepatectomy proposed date, October 25/26 at [facility] in [place]. -GI consult in the morning  Hyponatremia-sodium 129, baseline 129-1 35. - Hydrate  Uncontrolled diabetes mellitus-random glucose 140.  A1c 8.7. - SSI- s -Hold Home metformin  Hypertension-stable.  Not on medication.   DVT prophylaxis: Lovenox Code Status: Full code Family Communication: SPouse at bedside Disposition Plan: >/~ 2 days Consults called: GI Admission status: Inpt, tele I certify that at the point of admission it is my clinical judgment that the patient will require inpatient hospital care spanning beyond 2 midnights from the point of admission due to high intensity of service, high risk for further deterioration and high frequency of surveillance required.  Bethena Roys MD [facility]  04/28/2021, 5:03 PM

## 2021-04-28 NOTE — Progress Notes (Signed)
Pharmacy Antibiotic Note  Jonathan Keith is a 65 y.o. male admitted on 04/28/2021 with  unknown source .  Pharmacy has been consulted for Vancomycin and cefepime dosing.  Plan: Vancomycin 1500mg  IV loading dose, then 1000mg  IV q12h for AUC 486 Cefepime 2gm IV q8h F/U cxs and clinical progress Monitor V/S, labs and levels as indicated  Height: 6' (182.9 cm) Weight: 80.6 kg (177 lb 11.1 oz) IBW/kg (Calculated) : 77.6  Temp (24hrs), Avg:101.2 F (38.4 C), Min:99 F (37.2 C), Max:104.2 F (40.1 C)  Recent Labs  Lab 04/28/21 1243  WBC 12.1*  CREATININE 0.94  LATICACIDVEN 1.2    Estimated Creatinine Clearance: 86 mL/min (by C-G formula based on SCr of 0.94 mg/dL).    No Known Allergies  Antimicrobials this admission: Vancomycin 9/19 >>  Cefepime 9/19 >>   Microbiology results: 9/19 BCx: pending 9/19 UCx:    MRSA PCR:   Thank you for allowing pharmacy to be a part of this patient's care.  Isac Sarna, BS Pharm D, BCPS Clinical Pharmacist Pager [phone] 04/28/2021 2:22 PM

## 2021-04-28 NOTE — Plan of Care (Signed)
  Problem: Education: Goal: Knowledge of General Education information will improve Description: Including pain rating scale, medication(s)/side effects and non-pharmacologic comfort measures Outcome: Progressing   Problem: Health Behavior/Discharge Planning: Goal: Ability to manage health-related needs will improve Outcome: Progressing   Problem: Clinical Measurements: Goal: Diagnostic test results will improve Outcome: Progressing   

## 2021-04-28 NOTE — ED Triage Notes (Signed)
States he had surgery 4 days ago in [place], states he started having chills last night and is here for evaluation

## 2021-04-29 DIAGNOSIS — E1165 Type 2 diabetes mellitus with hyperglycemia: Secondary | ICD-10-CM

## 2021-04-29 DIAGNOSIS — I10 Essential (primary) hypertension: Secondary | ICD-10-CM

## 2021-04-29 LAB — BLOOD CULTURE ID PANEL (REFLEXED) - BCID2

## 2021-04-29 LAB — PROCALCITONIN: Procalcitonin: 0.54 ng/mL

## 2021-04-29 LAB — COMPREHENSIVE METABOLIC PANEL
ALT: 33 U/L (ref 0–44)
AST: 30 U/L (ref 15–41)
Albumin: 2.6 g/dL — ABNORMAL LOW (ref 3.5–5.0)
Alkaline Phosphatase: 206 U/L — ABNORMAL HIGH (ref 38–126)
Anion gap: 6 (ref 5–15)
BUN: 12 mg/dL (ref 8–23)
CO2: 24 mmol/L (ref 22–32)
Calcium: 8.5 mg/dL — ABNORMAL LOW (ref 8.9–10.3)
Chloride: 102 mmol/L (ref 98–111)
Creatinine, Ser: 0.72 mg/dL (ref 0.61–1.24)
GFR, Estimated: >60 mL/min (ref >60)
Glucose, Bld: 140 mg/dL — ABNORMAL HIGH (ref 70–99)
Potassium: 3.7 mmol/L (ref 3.5–5.1)
Sodium: 132 mmol/L — ABNORMAL LOW (ref 135–145)
Total Bilirubin: 1.3 mg/dL — ABNORMAL HIGH (ref 0.3–1.2)
Total Protein: 6.7 g/dL (ref 6.5–8.1)

## 2021-04-29 LAB — CBC
HCT: 34.2 % — ABNORMAL LOW (ref 39.0–52.0)
Hemoglobin: 11.1 g/dL — ABNORMAL LOW (ref 13.0–17.0)
MCH: 30.7 pg (ref 26.0–34.0)
MCHC: 32.5 g/dL (ref 30.0–36.0)
MCV: 94.7 fL (ref 80.0–100.0)
Platelets: 213 10*3/uL (ref 150–400)
RBC: 3.61 MIL/uL — ABNORMAL LOW (ref 4.22–5.81)
RDW: 13.3 % (ref 11.5–15.5)
WBC: 8.5 10*3/uL (ref 4.0–10.5)
nRBC: 0 % (ref 0.0–0.2)

## 2021-04-29 LAB — GLUCOSE, CAPILLARY
Glucose-Capillary: 123 mg/dL — ABNORMAL HIGH (ref 70–99)
Glucose-Capillary: 159 mg/dL — ABNORMAL HIGH (ref 70–99)
Glucose-Capillary: 139 mg/dL — ABNORMAL HIGH (ref 70–99)
Glucose-Capillary: 128 mg/dL — ABNORMAL HIGH (ref 70–99)

## 2021-04-29 NOTE — Consult Note (Signed)
@LOGO @   Referring Provider: [facility]  Primary Care Physician:  Ginger Organ Primary Gastroenterologist:  Dr. Laural Golden  Date of Admission: 04/28/21 Date of Consultation: 04/29/21  Reason for Consultation:  Fevers, history of cholangiocarcinoma with biliary stenting, recent portal vein embolization.   HPI:  Jonathan Keith is a 65 y.o. year old male with medical history significant for type 2 diabetes, GERD, HTN, cholangiocarcinoma diagnosed in July 2022 who presented to the emergency room on 9/19 with chief complaint of fever and chills x1 week.   Regarding his recent diagnosis of cholangiocarcinoma, he underwent biliary stenting x2 separate occurrences of right system in July 2022, and was referred to [facility] for consideration of surgical excision of cholangiocarcinoma. He underwent re-stenting of right and left system ion 03/21/21 at [facility], and recently underwent right portal vein embolization on 9/8 to encourage left liver lobe hypertrophy prior to plans for extended right hepatectomy with proposed surgical date 10/26.  Notably, he was also recently hospitalized 8/2-8/5  with SIRS thought to cholangitis and possible component of colitis/diverticulitis.  He was treated inpatient with Zosyn and discharged on 1 week of Augmentin.  Prior to this admission, patient reported he went to see PCP who started him on an antibiotic that he did not tolerate because it caused some chest pains; he was switched to doxycycline which he had taken for just 2 days prior to coming to the ED.    ED Course:  Febrile to 104.2.  Tachycardic 93-121, respiratory rate 17-28, blood pressure systolic 1 51-7 60.  O2 sat is good to 93% on room air.   WBC 12.1.  Sodium 129.  Lipase 54.  Elevated ALP at 271 (fairly stable), otherwise normal liver enzymes/bilirubin.   COVID test negative EDP talked to Dr. Bing Ree at [facility] who recommended CT abdomen and pelvis, to evaluate for liver abscess or  cholangitis and if CT unrevealing to treat any fever.   Blood and urine cultures obtained Broad-spectrum antibiotics Vanco cefepime and metronidazole started.  CT A/P with contrast with no acute process identified within the abdomen or pelvis.  New metallic density material within the right hepatic lobe presumably related to recent surgery which obscures most of its evaluation.  However, no new intrahepatic focal process identified within the limits of the exam.  Stable appearance of scattered dilated intrahepatic biliary ducts.  Bilateral biliary stents in place which appear grossly well-positioned.  Stable distention of the gallbladder without pericholecystic inflammatory changes.  Today: Had a very mild low grade fever starting last Monday. Family doctor prescribed an antibiotic which caused chest pain, so antibiotic was changed to doxycycline.  However, fever, chills, sweats continued last week and began to worsen over the weekend.  Wife is frustrated as he seems to develop an infection/fever every time he has a procedure.  Today he is actually feeling much better and wondering when he will be able to go home.  Denies fever, chills, joint pain, abdominal pain, nausea, vomiting, change in bowel habits, urinary symptoms. No brbpr or melena. On iron which turns stool green.  On Protonix daily. Denies GERD symptoms.  Denies chest pain, heart palpitations, shortness of breath.  Reports intermittent dry cough since July.  Has appt to meet with [facility] clinic on October 21st for reevaluation for surgery tentatively scheduled on 10/26.  Past Medical History:  Diagnosis Date   Chronic pain    neck, back, knees   Class 1 obesity due to excess calories with body mass  index (BMI) of 31.0 to 31.9 in adult    Claudication [facility])    DDD (degenerative disc disease), cervical    Diabetes mellitus ([place])    Dyslipidemia    Hypertension    Tobacco dependence     Past Surgical History:  Procedure Laterality Date    BILIARY BRUSHING N/A 02/25/2021   Procedure: BILIARY BRUSHING;  Surgeon: Rogene Houston, MD;  Location: [facility];  Service: Gastroenterology;  Laterality: N/A;   BILIARY STENT PLACEMENT N/A 02/25/2021   Procedure: BILIARY STENT PLACEMENT;  Surgeon: Rogene Houston, MD;  Location: [facility];  Service: Gastroenterology;  Laterality: N/A;   BILIARY STENT PLACEMENT  02/27/2021   Procedure: BILIARY STENT PLACEMENT (10FR x 9cm) IN THE RIGHT SYSTEM;  Surgeon: Rogene Houston, MD;  Location: [facility];  Service: Gastroenterology;;   BREAST SURGERY Left    benign lump- in his 27s.   ERCP N/A 02/25/2021   Procedure: ENDOSCOPIC RETROGRADE CHOLANGIOPANCREATOGRAPHY (ERCP);  Surgeon: Rogene Houston, MD;  Location: [facility];  Service: Gastroenterology;  Laterality: N/A;   ERCP N/A 02/27/2021   Procedure: ENDOSCOPIC RETROGRADE CHOLANGIOPANCREATOGRAPHY (ERCP);  Surgeon: Rogene Houston, MD;  Location: [facility];  Service: Gastroenterology;  Laterality: N/A;   KNEE SURGERY Left    x2   SPHINCTEROTOMY N/A 02/25/2021   Procedure: SPHINCTEROTOMY;  Surgeon: Rogene Houston, MD;  Location: [facility];  Service: Gastroenterology;  Laterality: N/A;   STENT REMOVAL  02/27/2021   Procedure: STENT REMOVAL (8.5Fr x 9cm);  Surgeon: Rogene Houston, MD;  Location: [facility];  Service: Gastroenterology;;    Prior to Admission medications   Medication Sig Start Date End Date Taking? Authorizing Provider  doxycycline (ADOXA) 100 MG tablet Take 100 mg by mouth 2 (two) times daily. 04/23/21  Yes [provider]  ferrous sulfate (FERROUSUL) 325 (65 FE) MG tablet Take 1 tablet (325 mg total) by mouth daily with breakfast. 03/18/21  Yes Rehman, Mechele Dawley, MD  metFORMIN (GLUCOPHAGE) 500 MG tablet Take 1 tablet by mouth 2 (two) times daily with a meal. 03/10/21  Yes [provider]  pantoprazole (PROTONIX) 40 MG tablet Take 1 tablet (40 mg total) by mouth daily. 02/28/21 02/28/22 Yes Barton Dubois, MD    Current  Facility-Administered Medications  Medication Dose Route Frequency Provider Last Rate Last Admin   0.9 %  sodium chloride infusion   Intravenous Continuous Emokpae, Ejiroghene E, MD 100 mL/hr at 04/28/21 1723 New Bag at 04/28/21 1723   acetaminophen (TYLENOL) tablet 650 mg  650 mg Oral Q8H Emokpae, Ejiroghene E, MD   650 mg at 04/29/21 0553   Or   acetaminophen (TYLENOL) suppository 650 mg  650 mg Rectal Q8H Emokpae, Ejiroghene E, MD       ceFEPIme (MAXIPIME) 2 g in sodium chloride 0.9 % 100 mL IVPB  2 g Intravenous Q8H Emokpae, Ejiroghene E, MD 200 mL/hr at 04/29/21 0553 2 g at 04/29/21 0553   enoxaparin (LOVENOX) injection 40 mg  40 mg Subcutaneous Q24H Emokpae, Ejiroghene E, MD   40 mg at 04/28/21 1815   insulin aspart (novoLOG) injection 0-5 Units  0-5 Units Subcutaneous QHS Emokpae, Ejiroghene E, MD       insulin aspart (novoLOG) injection 0-9 Units  0-9 Units Subcutaneous TID WC Emokpae, Ejiroghene E, MD       metroNIDAZOLE (FLAGYL) IVPB 500 mg  500 mg Intravenous Q12H Emokpae, Ejiroghene E, MD   Stopped at 04/28/21 2227   ondansetron (ZOFRAN) tablet 4 mg  4 mg Oral Q6H PRN Emokpae, Ejiroghene E, MD       Or   ondansetron (ZOFRAN) injection 4 mg  4 mg Intravenous Q6H PRN Emokpae, Ejiroghene E, MD       pantoprazole (PROTONIX) EC tablet 40 mg  40 mg Oral Daily Emokpae, Ejiroghene E, MD   40 mg at 04/28/21 1814   polyethylene glycol (MIRALAX / GLYCOLAX) packet 17 g  17 g Oral Daily PRN Emokpae, Ejiroghene E, MD       vancomycin (VANCOCIN) IVPB 1000 mg/200 mL premix  1,000 mg Intravenous Q12H Truddie Hidden, MD   Stopped at 04/29/21 0256    Allergies as of 04/28/2021   (No Known Allergies)    Family History  Problem Relation Age of Onset   CAD Mother    Cancer Neg Hx     Social History   Socioeconomic History   Marital status: Married    Spouse name: Not on file   Number of children: Not on file   Years of education: Not on file   Highest education level: Not on file   Occupational History   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  Tobacco Use   Smoking status: Every Day    Packs/day: 0.50    Years: 56.00    Pack years: 28.00    Types: Cigarettes   Smokeless tobacco: Never  Vaping Use   Vaping Use: Never used  Substance and Sexual Activity   Alcohol use: Not Currently    Comment: stopped 2.5 years ago 03/11/21   Drug use: Never   Sexual activity: Not on file  Other Topics Concern   Not on file  Social History Narrative   Not on file   Social Determinants of Health   Financial Resource Strain: Medium Risk   Difficulty of Paying Living Expenses: Somewhat hard  Food Insecurity: No Food Insecurity   Worried About Charity fundraiser in the Last Year: Never true   Ran Out of Food in the Last Year: Never true  Transportation Needs: No Transportation Needs   Lack of Transportation (Medical): No   Lack of Transportation (Non-Medical): No  Physical Activity: Insufficiently Active   Days of Exercise per Week: 2 days   Minutes of Exercise per Session: 20 min  Stress: No Stress Concern Present   Feeling of Stress : Only a little  Social Connections: Moderately Integrated   Frequency of Communication with Friends and Family: More than three times a week   Frequency of Social Gatherings with Friends and Family: More than three times a week   Attends Religious Services: More than 4 times per year   Active Member of Clubs or Organizations: No   Attends Archivist Meetings: Never   Marital Status: Married  Human resources officer Violence: Not At Risk   Fear of Current or Ex-Partner: No   Emotionally Abused: No   Physically Abused: No   Sexually Abused: No    Review of Systems: Gen: See HPI CV: See HPI Resp: See HPI GI: See HPI GU : See HPI MS: See HPI Heme: See HPI  Physical Exam: Vital signs in last 24 hours: Temp:  [98 F (36.7 C)-104.2 F (40.1 C)] 98 F (36.7 C) (09/20 0344) Pulse Rate:  [88-131] 95 (09/20 0344) Resp:  [17-28]  20 (09/20 0344) BP: (117-160)/(66-82) 125/73 (09/20 0344) SpO2:  [93 %-96 %] 93 % (09/20 0344) Weight:  [80.6 kg-82.4 kg] 82.4 kg (09/19 1756) Last BM Date: 04/27/21 General:  Alert,  Well-developed, well-nourished, pleasant and cooperative in NAD Head:  Normocephalic and atraumatic. Eyes:  Sclera clear, no icterus.   Conjunctiva pink. Ears:  Normal auditory acuity. Lungs:  Clear throughout to auscultation.   No wheezes, crackles, or rhonchi. No acute distress. Heart:  Regular rate and rhythm; no murmurs, clicks, rubs,  or gallops. Abdomen:  Soft, nontender and nondistended. No masses, hepatosplenomegaly.  Umbilical hernia present.  Normal bowel sounds, without guarding, and without rebound.   Rectal:  Deferred  Msk:  Symmetrical without gross deformities. Normal posture. Extremities:  Without edema. Neurologic:  Alert and  oriented x4;  grossly normal neurologically. Skin:  Intact without significant lesions or rashes. Psych: Normal mood and affect.  Intake/Output from previous day: 09/19 0701 - 09/20 0700 In: 3805.3 [I.V.:405.2; IV Piggyback:3400.1] Out: -  Intake/Output this shift: No intake/output data recorded.  Lab Results: Recent Labs    04/28/21 1243 04/29/21 0510  WBC 12.1* 8.5  HGB 12.6* 11.1*  HCT 38.5* 34.2*  PLT 274 213   BMET Recent Labs    04/28/21 1243 04/29/21 0510  NA 129* 132*  K 4.4 3.7  CL 101 102  CO2 19* 24  GLUCOSE 140* 140*  BUN 16 12  CREATININE 0.94 0.72  CALCIUM 8.5* 8.5*   LFT Recent Labs    04/28/21 1243 04/29/21 0510  PROT 7.7 6.7  ALBUMIN 3.0* 2.6*  AST 40 30  ALT 42 33  ALKPHOS 271* 206*  BILITOT 1.1 1.3*   PT/INR Recent Labs    04/28/21 1243  LABPROT 13.7  INR 1.1   Hepatitis Panel No results for input(s): HEPBSAG, HCVAB, HEPAIGM, HEPBIGM in the last 72 hours. C-Diff No results for input(s): CDIFFTOX in the last 72 hours.  Studies/Results: CT Abdomen Pelvis W Contrast  Result Date: 04/28/2021 CLINICAL  DATA:  Abdominal pain, fever, post-op EXAM: CT ABDOMEN AND PELVIS WITH CONTRAST TECHNIQUE: Multidetector CT imaging of the abdomen and pelvis was performed using the standard protocol following bolus administration of intravenous contrast. CONTRAST:  75m OMNIPAQUE IOHEXOL 350 MG/ML SOLN COMPARISON:  CT March 12, 2021 FINDINGS: Inferior chest: The lung bases are well-aerated. Hepatobiliary: There is dense metallic artifact which obscures evaluation of most of the hepatic parenchyma. Scattered dilated intrahepatic biliary ducts are again seen, left-greater-than-right. Bilateral biliary stents appear appropriately positioned. Stable distention of the gallbladder without pericholecystic inflammatory changes. Spleen: Normal in size without focal abnormality. Pancreas: No pancreatic ductal dilatation or surrounding inflammatory changes. Adrenals/Urinary Tract: Adrenal glands are unremarkable. Kidneys are normal, without renal calculi, focal lesion, or hydronephrosis. Bladder is unremarkable. Stomach/Bowel: The stomach, small bowel and large bowel are normal in caliber without abnormal wall thickening or surrounding inflammatory changes. The appendix is normal. Reproductive: Prostate is unremarkable. Lymphatic: No enlarged lymph nodes in the abdomen or pelvis. Vasculature: The abdominal aorta is normal in caliber. Scattered fibrofatty and calcific atherosclerosis. The main and left portal veins are patent. The right portal vein is obscured. Other: No abdominopelvic ascites. Musculoskeletal: Multilevel degenerative changes particularly of the lower lumbar spine. The soft tissues are unremarkable. IMPRESSION: No new acute process identified within the abdomen or pelvis. There is new metallic density material within the right hepatic lobe presumably related to recent surgery, which obscures much of its evaluation. However, no new intrahepatic focal process identified within the limits of the exam. Stable appearance of  scattered dilated intrahepatic biliary ducts. Bilateral biliary stents are in place, which appear grossly well positioned. Stable distention of the gallbladder without pericholecystic inflammatory  changes. Electronically Signed   By: Albin Felling M.D.   On: 04/28/2021 14:49   DG Chest Port 1 View  Result Date: 04/28/2021 CLINICAL DATA:  Weakness, possible sepsis EXAM: PORTABLE CHEST 1 VIEW COMPARISON:  03/11/2021 FINDINGS: The heart size and mediastinal contours are within normal limits. Both lungs are clear. The visualized skeletal structures are unremarkable. IMPRESSION: No active disease. Electronically Signed   By: Davina Poke D.O.   On: 04/28/2021 13:23    Impression: 65 y.o. year old male with medical history significant for type 2 diabetes, GERD, HTN, cholangiocarcinoma diagnosed in July 2022, and recent hospitalization 8/2-8/5  with SIRS thought to cholangitis and possible component of colitis/diverticulitis who presented to the emergency room on 9/19 with chief complaint of fever and chills x1 week, now admitted with with fever of unknown etiology, meeting SIRS criteria. GI consulted to weigh in due to history of cholangiocarcinoma, history of biliary stenting, history of cholangitis.    Fever/SIRS: High fevers, up to 104.2 in the ED, tachycardia, leukocytosis of 12.1.  LFTs have continued to improve since prior biliary stenting with AST/ALT normal, alk phos mildly elevated at 271, T bili also normal.  UA negative.  Urine culture pending.  Blood cultures pending. CT A/P with contrast with no acute findings. Biliary stents appear well positioned.  Currently on broad-spectrum IV antibiotics and IV fluids and reports feeling improved today. He is afebrile today, WBC normalized, alk phos improved to 206, t bili increased slightly to 1.3. He has no significant GI symptoms.  Etiology unclear.  Suspect acute infection is likely related to recent portal vein embolization on 9/8 at [facility] as  patient began to develop fever just a few days after.  Clinical presentation/laboratory evaluation is not consistent with cholangitis.  No need for endoscopic intervention. Recommend continuing broad-spectrum antibiotics for now, follow-up on blood cultures.  Cholangiocarcinoma: Diagnosed July 2022. Underwent biliary stenting x2 separate occurrences of right system in July 2022, and was referred to [facility] for consideration of surgical excision of cholangiocarcinoma. He underwent re-stenting of right and left system on 03/21/21 at [facility] which seems to be working well and has significant improvement in LFTs and total bilirubin. CT suggest stents are in good position. Also recently underwent right portal vein embolization on 9/8 to encourage left liver lobe hypertrophy prior to plans for extended right hepatectomy with proposed surgical date 10/26. Patient will need to continue to follow with [facility] clinic. No need for any intervention this admission.   History of possible colitis vs diverticulitis in August 2022: CT 03/11/2021 with possible mild colitis versus diverticulitis within the proximal to mid sigmoid colon.  CT this admission with no bowel wall inflammation.  No significant GI symptoms.  He will need follow-up colonoscopy outpatient as previously recommended.  Plan: No need for endoscopic intervention. Continue to broad spectrum antibiotics.  Follow up on blood cultures.  Trend LFTs daily.  Outpatient colonoscopy to follow-up on sigmoid wall thickening noted on CT in August 2022.  Keep appointments with [facility] clinic.     LOS: 1 day    04/29/2021, 7:44 AM   Aliene Altes, PA-C [facility]

## 2021-04-29 NOTE — Progress Notes (Addendum)
PROGRESS NOTE    Jonathan Keith  VPX:[id number] DOB: 1955-08-29 DOA: 04/28/2021 PCP: Ginger Organ   Brief Narrative:  65 year old with history of cholangiocarcinoma, DM2, HTN comes to the hospital with complaints of fever and chills for 1 week.  He was prescribed antibiotics by PCP but did not tolerate this and was switched to doxycycline.  About 6 weeks ago he was admitted for similar reason diagnosed with colitis versus diverticulitis treated with Zosyn and eventually discharged on Augmentin.  For his cholangiocarcinoma, stents were placed by Dr. Melony Overly and referred to [facility], also had portal vein embolization with plans to extended right hepatectomy.  EDP discussed case with Sudhindran who recommended obtaining CT which did not show any acute new process.  Procalcitonin 0.54, UA is negative.  Chest x-ray is negative.  Empiric vancomycin, Flagyl and cefepime started, GI team consulted.   Assessment & Plan:   Principal Problem:   SIRS (systemic inflammatory response syndrome) (HCC) Active Problems:   Diabetes mellitus (HCC)   Hypertension   Cholangiocarcinoma (HCC)   Hyponatremia   SIRS -Fever, tachycardia and mild leukocytosis.  No obvious evidence of infection.  Chest x-ray, UA is negative.  CT of the A/P-negative for acute pathology, biliary stent well-positioned.  Procalcitonin 0.54 - Empiric antibiotics-vancomycin, cefepime and Flagyl - Continue IV fluids - GI consulted -Urine cultures, blood cultures-drawn - COVID & flu-negative  Addendum at 505pm BCID positive for psuedomonas, Susceptibility pending. Likely RUQ is the source. Will dc vanc and flagyl, cont Cefepime for now.   Cholangiocarcinoma - Status post stent and portal vein embolization.  He has been referred to [facility] for further stent placement and hepatectomy.  He has upcoming appointment in October  Uncontrolled diabetes mellitus type 2, hyperglycemia - A1c 8.7.  Insulin sliding scale and  Accu-Chek - Metformin on hold  Essential hypertension - Not on any home meds   DVT prophylaxis: Lovenox Code Status: Full Family Communication:  Spouse Updated.   Status is: Inpatient  Remains inpatient appropriate because:Inpatient level of care appropriate due to severity of illness  Dispo: The patient is from: Home              Anticipated d/c is to: Home              Patient currently is not medically stable to d/c.   Difficult to place patient No   Subjective: Feels ok no complaints.   Review of Systems Otherwise negative except as per HPI, including: General: Denies fever, chills, night sweats or unintended weight loss. Resp: Denies cough, wheezing, shortness of breath. Cardiac: Denies chest pain, palpitations, orthopnea, paroxysmal nocturnal dyspnea. GI: Denies abdominal pain, nausea, vomiting, diarrhea or constipation GU: Denies dysuria, frequency, hesitancy or incontinence MS: Denies muscle aches, joint pain or swelling Neuro: Denies headache, neurologic deficits (focal weakness, numbness, tingling), abnormal gait Psych: Denies anxiety, depression, SI/HI/AVH Skin: Denies new rashes or lesions ID: Denies sick contacts, exotic exposures, travel  Examination:  General exam: Appears calm and comfortable  Respiratory system: Clear to auscultation. Respiratory effort normal. Cardiovascular system: S1 & S2 heard, RRR. No JVD, murmurs, rubs, gallops or clicks. No pedal edema. Gastrointestinal system: Abdomen is nondistended, soft and nontender. No organomegaly or masses felt. Normal bowel sounds heard. Central nervous system: Alert and oriented. No focal neurological deficits. Extremities: Symmetric 5 x 5 power. Skin: No rashes, lesions or ulcers Psychiatry: Judgement and insight appear normal. Mood & affect appropriate.     Objective: Vitals:  04/28/21 1735 04/28/21 1756 04/28/21 2009 04/29/21 0344  BP:  132/67 129/75 125/73  Pulse:  (!) 104 88 95  Resp:  18  20 20   Temp: 98.9 F (37.2 C) 98.8 F (37.1 C) 98.2 F (36.8 C) 98 F (36.7 C)  TempSrc: Oral Oral    SpO2:  93% 95% 93%  Weight:  82.4 kg    Height:  6' (1.829 m)      Intake/Output Summary (Last 24 hours) at 04/29/2021 0757 Last data filed at 04/29/2021 0401 Gross per 24 hour  Intake 3805.28 ml  Output --  Net 3805.28 ml   Filed Weights   04/28/21 1204 04/28/21 1756  Weight: 80.6 kg 82.4 kg     Data Reviewed:   CBC: Recent Labs  Lab 04/28/21 1243 04/29/21 0510  WBC 12.1* 8.5  NEUTROABS 9.7*  --   HGB 12.6* 11.1*  HCT 38.5* 34.2*  MCV 94.1 94.7  PLT 274 956   Basic Metabolic Panel: Recent Labs  Lab 04/28/21 1243 04/29/21 0510  NA 129* 132*  K 4.4 3.7  CL 101 102  CO2 19* 24  GLUCOSE 140* 140*  BUN 16 12  CREATININE 0.94 0.72  CALCIUM 8.5* 8.5*   GFR: Estimated Creatinine Clearance: 101 mL/min (by C-G formula based on SCr of 0.72 mg/dL). Liver Function Tests: Recent Labs  Lab 04/28/21 1243 04/29/21 0510  AST 40 30  ALT 42 33  ALKPHOS 271* 206*  BILITOT 1.1 1.3*  PROT 7.7 6.7  ALBUMIN 3.0* 2.6*   Recent Labs  Lab 04/28/21 1243  LIPASE 54*   No results for input(s): AMMONIA in the last 168 hours. Coagulation Profile: Recent Labs  Lab 04/28/21 1243  INR 1.1   Cardiac Enzymes: No results for input(s): CKTOTAL, CKMB, CKMBINDEX, TROPONINI in the last 168 hours. BNP (last 3 results) No results for input(s): PROBNP in the last 8760 hours. HbA1C: No results for input(s): HGBA1C in the last 72 hours. CBG: Recent Labs  Lab 04/28/21 1837 04/29/21 0742  GLUCAP 114* 123*   Lipid Profile: No results for input(s): CHOL, HDL, LDLCALC, TRIG, CHOLHDL, LDLDIRECT in the last 72 hours. Thyroid Function Tests: No results for input(s): TSH, T4TOTAL, FREET4, T3FREE, THYROIDAB in the last 72 hours. Anemia Panel: No results for input(s): VITAMINB12, FOLATE, FERRITIN, TIBC, IRON, RETICCTPCT in the last 72 hours. Sepsis Labs: Recent Labs  Lab  04/28/21 1243 04/29/21 0510  PROCALCITON 0.32 0.54  LATICACIDVEN 1.2  --     Recent Results (from the past 240 hour(s))  Resp Panel by RT-PCR (Flu A&B, Covid) Nasopharyngeal Swab     Status: None   Collection Time: 04/28/21 12:43 PM   Specimen: Nasopharyngeal Swab; Nasopharyngeal(NP) swabs in vial transport medium  Result Value Ref Range Status   SARS Coronavirus 2 by RT PCR NEGATIVE NEGATIVE Final    Comment: (NOTE) SARS-CoV-2 target nucleic acids are NOT DETECTED.  The SARS-CoV-2 RNA is generally detectable in upper respiratory specimens during the acute phase of infection. The lowest concentration of SARS-CoV-2 viral copies this assay can detect is 138 copies/mL. A negative result does not preclude SARS-Cov-2 infection and should not be used as the sole basis for treatment or other patient management decisions. A negative result may occur with  improper specimen collection/handling, submission of specimen other than nasopharyngeal swab, presence of viral mutation(s) within the areas targeted by this assay, and inadequate number of viral copies(<138 copies/mL). A negative result must be combined with clinical observations, patient history, and epidemiological  information. The expected result is Negative.  Fact Sheet for Patients:  EntrepreneurPulse.com.au  Fact Sheet for Healthcare Providers:  IncredibleEmployment.be  This test is no t yet approved or cleared by the [place] FDA and  has been authorized for detection and/or diagnosis of SARS-CoV-2 by FDA under an Emergency Use Authorization (EUA). This EUA will remain  in effect (meaning this test can be used) for the duration of the COVID-19 declaration under Section 564(b)(1) of the Act, 21 U.S.C.section 360bbb-3(b)(1), unless the authorization is terminated  or revoked sooner.       Influenza A by PCR NEGATIVE NEGATIVE Final   Influenza B by PCR NEGATIVE NEGATIVE Final     Comment: (NOTE) The Xpert Xpress SARS-CoV-2/FLU/RSV plus assay is intended as an aid in the diagnosis of influenza from Nasopharyngeal swab specimens and should not be used as a sole basis for treatment. Nasal washings and aspirates are unacceptable for Xpert Xpress SARS-CoV-2/FLU/RSV testing.  Fact Sheet for Patients: EntrepreneurPulse.com.au  Fact Sheet for Healthcare Providers: IncredibleEmployment.be  This test is not yet approved or cleared by the [place] FDA and has been authorized for detection and/or diagnosis of SARS-CoV-2 by FDA under an Emergency Use Authorization (EUA). This EUA will remain in effect (meaning this test can be used) for the duration of the COVID-19 declaration under Section 564(b)(1) of the Act, 21 U.S.C. section 360bbb-3(b)(1), unless the authorization is terminated or revoked.  Performed at [facility], [address] [place], [place] [address]   Blood Culture (routine x 2)     Status: None (Preliminary result)   Collection Time: 04/28/21 12:43 PM   Specimen: BLOOD RIGHT HAND  Result Value Ref Range Status   Specimen Description BLOOD RIGHT HAND  Final   Special Requests   Final    Blood Culture adequate volume BOTTLES DRAWN AEROBIC AND ANAEROBIC Performed at [facility], [address], [place], [place] [address]    Culture PENDING  Incomplete   Report Status PENDING  Incomplete  Blood Culture (routine x 2)     Status: None (Preliminary result)   Collection Time: 04/28/21 12:43 PM   Specimen: BLOOD LEFT ARM  Result Value Ref Range Status   Specimen Description BLOOD LEFT ARM  Final   Special Requests   Final    Blood Culture results may not be optimal due to an excessive volume of blood received in culture bottles BOTTLES DRAWN AEROBIC AND ANAEROBIC Performed at [facility], [address], [place], [place] [address]    Culture PENDING  Incomplete   Report Status PENDING  Incomplete          Radiology Studies: CT Abdomen Pelvis W Contrast  Result Date: 04/28/2021 CLINICAL DATA:  Abdominal pain, fever, post-op EXAM: CT ABDOMEN AND PELVIS WITH CONTRAST TECHNIQUE: Multidetector CT imaging of the abdomen and pelvis was performed using the standard protocol following bolus administration of intravenous contrast. CONTRAST:  87mL OMNIPAQUE IOHEXOL 350 MG/ML SOLN COMPARISON:  CT March 12, 2021 FINDINGS: Inferior chest: The lung bases are well-aerated. Hepatobiliary: There is dense metallic artifact which obscures evaluation of most of the hepatic parenchyma. Scattered dilated intrahepatic biliary ducts are again seen, left-greater-than-right. Bilateral biliary stents appear appropriately positioned. Stable distention of the gallbladder without pericholecystic inflammatory changes. Spleen: Normal in size without focal abnormality. Pancreas: No pancreatic ductal dilatation or surrounding inflammatory changes. Adrenals/Urinary Tract: Adrenal glands are unremarkable. Kidneys are normal, without renal calculi, focal lesion, or hydronephrosis. Bladder is unremarkable. Stomach/Bowel: The stomach, small bowel and  large bowel are normal in caliber without abnormal wall thickening or surrounding inflammatory changes. The appendix is normal. Reproductive: Prostate is unremarkable. Lymphatic: No enlarged lymph nodes in the abdomen or pelvis. Vasculature: The abdominal aorta is normal in caliber. Scattered fibrofatty and calcific atherosclerosis. The main and left portal veins are patent. The right portal vein is obscured. Other: No abdominopelvic ascites. Musculoskeletal: Multilevel degenerative changes particularly of the lower lumbar spine. The soft tissues are unremarkable. IMPRESSION: No new acute process identified within the abdomen or pelvis. There is new metallic density material within the right hepatic lobe presumably related to recent surgery, which obscures much of its evaluation. However, no new  intrahepatic focal process identified within the limits of the exam. Stable appearance of scattered dilated intrahepatic biliary ducts. Bilateral biliary stents are in place, which appear grossly well positioned. Stable distention of the gallbladder without pericholecystic inflammatory changes. Electronically Signed   By: Albin Felling M.D.   On: 04/28/2021 14:49   DG Chest Port 1 View  Result Date: 04/28/2021 CLINICAL DATA:  Weakness, possible sepsis EXAM: PORTABLE CHEST 1 VIEW COMPARISON:  03/11/2021 FINDINGS: The heart size and mediastinal contours are within normal limits. Both lungs are clear. The visualized skeletal structures are unremarkable. IMPRESSION: No active disease. Electronically Signed   By: Davina Poke D.O.   On: 04/28/2021 13:23        Scheduled Meds:  acetaminophen  650 mg Oral Q8H   Or   acetaminophen  650 mg Rectal Q8H   enoxaparin (LOVENOX) injection  40 mg Subcutaneous Q24H   insulin aspart  0-5 Units Subcutaneous QHS   insulin aspart  0-9 Units Subcutaneous TID WC   pantoprazole  40 mg Oral Daily   Continuous Infusions:  sodium chloride 100 mL/hr at 04/28/21 1723   ceFEPime (MAXIPIME) IV 2 g (04/29/21 0553)   metronidazole Stopped (04/28/21 2227)   vancomycin Stopped (04/29/21 0256)     LOS: 1 day   Time spent= 35 mins    Jarquez Mestre Arsenio Loader, MD [facility]  If 7PM-7AM, please contact night-coverage  04/29/2021, 7:57 AM

## 2021-04-29 NOTE — Progress Notes (Signed)
PHARMACY - PHYSICIAN COMMUNICATION CRITICAL VALUE ALERT - BLOOD CULTURE IDENTIFICATION (BCID)  Jonathan Keith is an 65 y.o. male who presented to [facility] on 04/28/2021 with a chief complaint of fever and chills x 1 week  Assessment:  Lab reports GNR, BCID = pseudomonas aeruginosa  Name of physician (or Provider) Contacted: Dr Reesa Chew  Current antibiotics: Cefepime, Flagyl, Vanc  Changes to prescribed antibiotics recommended: d/w Dr Reesa Chew, continue Cefepime q8h and d/c Vanc and Flagyl Patient is on recommended antibiotics - No changes needed  Results for orders placed or performed during the hospital encounter of 04/28/21  Blood Culture ID Panel (Reflexed) (Collected: 04/28/2021 12:43 PM)  Result Value Ref Range   Enterococcus faecalis NOT DETECTED NOT DETECTED   Enterococcus Faecium NOT DETECTED NOT DETECTED   Listeria monocytogenes NOT DETECTED NOT DETECTED   Staphylococcus species NOT DETECTED NOT DETECTED   Staphylococcus aureus (BCID) NOT DETECTED NOT DETECTED   Staphylococcus epidermidis NOT DETECTED NOT DETECTED   Staphylococcus lugdunensis NOT DETECTED NOT DETECTED   Streptococcus species NOT DETECTED NOT DETECTED   Streptococcus agalactiae NOT DETECTED NOT DETECTED   Streptococcus pneumoniae NOT DETECTED NOT DETECTED   Streptococcus pyogenes NOT DETECTED NOT DETECTED   A.calcoaceticus-baumannii NOT DETECTED NOT DETECTED   Bacteroides fragilis NOT DETECTED NOT DETECTED   Enterobacterales NOT DETECTED NOT DETECTED   Enterobacter cloacae complex NOT DETECTED NOT DETECTED   Escherichia coli NOT DETECTED NOT DETECTED   Klebsiella aerogenes NOT DETECTED NOT DETECTED   Klebsiella oxytoca NOT DETECTED NOT DETECTED   Klebsiella pneumoniae NOT DETECTED NOT DETECTED   Proteus species NOT DETECTED NOT DETECTED   Salmonella species NOT DETECTED NOT DETECTED   Serratia marcescens NOT DETECTED NOT DETECTED   Haemophilus influenzae NOT DETECTED NOT DETECTED   Neisseria meningitidis NOT  DETECTED NOT DETECTED   Pseudomonas aeruginosa DETECTED (A) NOT DETECTED   Stenotrophomonas maltophilia NOT DETECTED NOT DETECTED   Candida albicans NOT DETECTED NOT DETECTED   Candida auris NOT DETECTED NOT DETECTED   Candida glabrata NOT DETECTED NOT DETECTED   Candida krusei NOT DETECTED NOT DETECTED   Candida parapsilosis NOT DETECTED NOT DETECTED   Candida tropicalis NOT DETECTED NOT DETECTED   Cryptococcus neoformans/gattii NOT DETECTED NOT DETECTED   CTX-M ESBL NOT DETECTED NOT DETECTED   Carbapenem resistance IMP NOT DETECTED NOT DETECTED   Carbapenem resistance KPC NOT DETECTED NOT DETECTED   Carbapenem resistance NDM NOT DETECTED NOT DETECTED   Carbapenem resistance VIM NOT DETECTED NOT DETECTED    [name] 04/29/2021  5:04 PM

## 2021-04-30 DIAGNOSIS — R7881 Bacteremia: Secondary | ICD-10-CM

## 2021-04-30 DIAGNOSIS — B965 Pseudomonas (aeruginosa) (mallei) (pseudomallei) as the cause of diseases classified elsewhere: Secondary | ICD-10-CM

## 2021-04-30 LAB — GLUCOSE, CAPILLARY
Glucose-Capillary: 115 mg/dL — ABNORMAL HIGH (ref 70–99)
Glucose-Capillary: 135 mg/dL — ABNORMAL HIGH (ref 70–99)
Glucose-Capillary: 188 mg/dL — ABNORMAL HIGH (ref 70–99)

## 2021-04-30 LAB — PROCALCITONIN: Procalcitonin: 0.59 ng/mL

## 2021-04-30 NOTE — Progress Notes (Signed)
Subjective: Patient denies any abdominal pain, reports he has had some nausea today which is new. No episodes of vomiting. He reports his appetite has also decreased since he came to the hospital due to the nausea he is experiencing. Denies fevers or chills today.   Objective: Vital signs in last 24 hours: Temp:  [98.2 F (36.8 C)-99.2 F (37.3 C)] 98.2 F (36.8 C) (09/21 0428) Pulse Rate:  [75-86] 80 (09/21 0428) Resp:  [20-21] 20 (09/21 0428) BP: (140-156)/(72-80) 140/72 (09/21 0428) SpO2:  [93 %-98 %] 94 % (09/21 0428) FiO2 (%):  [21 %] 21 % (09/20 1214) Last BM Date: 04/29/21 General:   Alert and oriented, pleasant Head:  Normocephalic and atraumatic. Eyes:  No icterus, sclera clear. Conjuctiva pink.  Mouth:  Without lesions, mucosa pink and moist.   Heart:  S1, S2 present, no murmurs noted.  Lungs: Clear to auscultation bilaterally, without wheezing, rales, or rhonchi.  Abdomen:  Bowel sounds present, soft, non-tender, non-distended. No HSM or hernias noted. No rebound or guarding. No masses appreciated  Msk:  Symmetrical without gross deformities. Normal posture. Pulses:  Normal pulses noted. Extremities:  Without clubbing or edema. Neurologic:  Alert and  oriented x4;  grossly normal neurologically. Skin:  Warm and dry, intact without significant lesions.  Psych:  Alert and cooperative. Normal mood and affect.  Intake/Output from previous day: 09/20 0701 - 09/21 0700 In: 1240 [P.O.:840; IV Piggyback:400] Out: -  Intake/Output this shift: No intake/output data recorded.  Lab Results: Recent Labs    04/28/21 1243 04/29/21 0510  WBC 12.1* 8.5  HGB 12.6* 11.1*  HCT 38.5* 34.2*  PLT 274 213   BMET Recent Labs    04/28/21 1243 04/29/21 0510  NA 129* 132*  K 4.4 3.7  CL 101 102  CO2 19* 24  GLUCOSE 140* 140*  BUN 16 12  CREATININE 0.94 0.72  CALCIUM 8.5* 8.5*   LFT Recent Labs    04/28/21 1243 04/29/21 0510  PROT 7.7 6.7  ALBUMIN 3.0* 2.6*  AST  40 30  ALT 42 33  ALKPHOS 271* 206*  BILITOT 1.1 1.3*   PT/INR Recent Labs    04/28/21 1243  LABPROT 13.7  INR 1.1   Studies/Results: CT Abdomen Pelvis W Contrast  Result Date: 04/28/2021 CLINICAL DATA:  Abdominal pain, fever, post-op EXAM: CT ABDOMEN AND PELVIS WITH CONTRAST TECHNIQUE: Multidetector CT imaging of the abdomen and pelvis was performed using the standard protocol following bolus administration of intravenous contrast. CONTRAST:  75m OMNIPAQUE IOHEXOL 350 MG/ML SOLN COMPARISON:  CT March 12, 2021 FINDINGS: Inferior chest: The lung bases are well-aerated. Hepatobiliary: There is dense metallic artifact which obscures evaluation of most of the hepatic parenchyma. Scattered dilated intrahepatic biliary ducts are again seen, left-greater-than-right. Bilateral biliary stents appear appropriately positioned. Stable distention of the gallbladder without pericholecystic inflammatory changes. Spleen: Normal in size without focal abnormality. Pancreas: No pancreatic ductal dilatation or surrounding inflammatory changes. Adrenals/Urinary Tract: Adrenal glands are unremarkable. Kidneys are normal, without renal calculi, focal lesion, or hydronephrosis. Bladder is unremarkable. Stomach/Bowel: The stomach, small bowel and large bowel are normal in caliber without abnormal wall thickening or surrounding inflammatory changes. The appendix is normal. Reproductive: Prostate is unremarkable. Lymphatic: No enlarged lymph nodes in the abdomen or pelvis. Vasculature: The abdominal aorta is normal in caliber. Scattered fibrofatty and calcific atherosclerosis. The main and left portal veins are patent. The right portal vein is obscured. Other: No abdominopelvic ascites. Musculoskeletal: Multilevel degenerative changes particularly of the  lower lumbar spine. The soft tissues are unremarkable. IMPRESSION: No new acute process identified within the abdomen or pelvis. There is new metallic density material within  the right hepatic lobe presumably related to recent surgery, which obscures much of its evaluation. However, no new intrahepatic focal process identified within the limits of the exam. Stable appearance of scattered dilated intrahepatic biliary ducts. Bilateral biliary stents are in place, which appear grossly well positioned. Stable distention of the gallbladder without pericholecystic inflammatory changes. Electronically Signed   By: Albin Felling M.D.   On: 04/28/2021 14:49   DG Chest Port 1 View  Result Date: 04/28/2021 CLINICAL DATA:  Weakness, possible sepsis EXAM: PORTABLE CHEST 1 VIEW COMPARISON:  03/11/2021 FINDINGS: The heart size and mediastinal contours are within normal limits. Both lungs are clear. The visualized skeletal structures are unremarkable. IMPRESSION: No active disease. Electronically Signed   By: Davina Poke D.O.   On: 04/28/2021 13:23    Assessment: Jonathan Keith is a 65 year old male with pmh of DM type 2, GERD, HTN, cholangiocarcinoma diagnosed July 2022 who presnted to the ER on 9/19 with c/o fever and chills x1 week. He underwent biliary stenting x2 separate occurrence of right system in July 2022, referred to [facility] clinic for consideration of surgical excision of cholangiocarcinoma. He underwent re=stenting of R and L system on 03/21/21 at [facility] and recently underwent R portal vein embolization on 9/8 to encourage left liver lobe hypertrophy in preparation of plans for extended R hepatectomy with planned surgical date of 06/04/21. Recently hospitalized 8/2-8/5 with SIRS, through to be cholangitis and possible component of colitis/diverticulitis. He was treated with zosyn inpatient and discharged with 1 week course of Augmentin, thereafter. Started course of Doxycycline from PCP which he took for only 2 days prior to presenting to ED.   Fever/SIRS: fever as high as 104.2 in ED, however, afebrile since yesterday, WBC WNL at 8.5. , LFTs continue to improve since  prior biliary stenting, AST/ALT WNL, alk phos mildly elevated at 206, trending down, T bili elevated slightly to 1.3 yesterday. CT A/P with contrast indicated no acute findings, biliary stents appear well positioned. Blood cultures positive for Pseudomonas aeruginosa, which patient is on cefepime for, suspect this is related to cholangitis secondary to biliary stent placement, given no other source of infection has been identified.   Cholangiocarcinoma: diagnosed July 2022, biliary stenting x2 occurrences of R system in July 2022, following with [facility] for possible surgical excision of cholangiocarcinoma. Re-stenting of R and L system on 03/21/21 at [facility] clinic with improvement in LFTs and T bili. CT suggests stents are in good position at this time. Continue to Follow with [facility], no further intervention of cholangiocarcinoma needed during this admission.   Plan: Antibiotic administration management by hospitalist Trend LFTs daily Outpatient colonoscopy for f/u o sigmoid wall thickening noted on CT Continue to follow with [facility]   LOS: 2 days    04/30/2021, 10:27 AM  Eshan Trupiano L. Alver Sorrow, MSN, APRN, AGNP-C Adult-Gerontology Nurse Practitioner [facility]

## 2021-04-30 NOTE — Progress Notes (Signed)
PROGRESS NOTE    Jonathan Keith  UMP:[id number] DOB: 1956/03/19 DOA: 04/28/2021 PCP: Ginger Organ   Brief Narrative:  65 year old with history of cholangiocarcinoma, DM2, HTN comes to the hospital with complaints of fever and chills for 1 week.  He was prescribed antibiotics by PCP but did not tolerate this and was switched to doxycycline.  About 6 weeks ago he was admitted for similar reason diagnosed with colitis versus diverticulitis treated with Zosyn and eventually discharged on Augmentin.  For his cholangiocarcinoma, stents were placed by Dr. Melony Overly and referred to [facility], also had portal vein embolization with plans to extended right hepatectomy.  EDP discussed case with Sudhindran who recommended obtaining CT which did not show any acute new process.  Procalcitonin 0.54, UA is negative.  Chest x-ray is negative.  Empiric vancomycin, Flagyl and cefepime started, GI team consulted.  Eventually cultures grew Pseudomonas therefore vancomycin and Flagyl was discontinued.   Assessment & Plan:   Principal Problem:   SIRS (systemic inflammatory response syndrome) (HCC) Active Problems:   Diabetes mellitus (HCC)   Hypertension   Cholangiocarcinoma (HCC)   Hyponatremia   Pseudomonas bacteremia, likely GI source -Sepsis physiology slowly improving.  Chest x-ray, UA is negative.  CT of the A/P-negative for acute pathology, biliary stent well-positioned.  Procalcitonin 0.54 -Antibiotics transition to cefepime, further adjust according to susceptibility data. -GI will eventually discuss this case with [place] [facility] once we have all the data.  Cholangiocarcinoma - Status post stent and portal vein embolization.  He has been referred to [facility] for further stent placement and hepatectomy.  He has upcoming appointment in October  Uncontrolled diabetes mellitus type 2, hyperglycemia - A1c 8.7.  Insulin sliding scale and Accu-Chek - Metformin on hold  Essential hypertension -  Not on any home meds   DVT prophylaxis: Lovenox Code Status: Full Family Communication: Family present at bedside  Status is: Inpatient  Remains inpatient appropriate because:Inpatient level of care appropriate due to severity of illness.  Maintain hospital stay until all the culture data is available.  Currently on IV cefepime  Dispo: The patient is from: Home              Anticipated d/c is to: Home              Patient currently is not medically stable to d/c.   Difficult to place patient No   Subjective: Remains afebrile, no complaints besides being uncomfortable in the current bed  Review of Systems Otherwise negative except as per HPI, including: General: Denies fever, chills, night sweats or unintended weight loss. Resp: Denies cough, wheezing, shortness of breath. Cardiac: Denies chest pain, palpitations, orthopnea, paroxysmal nocturnal dyspnea. GI: Denies abdominal pain, nausea, vomiting, diarrhea or constipation GU: Denies dysuria, frequency, hesitancy or incontinence MS: Denies muscle aches, joint pain or swelling Neuro: Denies headache, neurologic deficits (focal weakness, numbness, tingling), abnormal gait Psych: Denies anxiety, depression, SI/HI/AVH Skin: Denies new rashes or lesions ID: Denies sick contacts, exotic exposures, travel  Examination:  Constitutional: Not in acute distress Respiratory: Clear to auscultation bilaterally Cardiovascular: Normal sinus rhythm, no rubs Abdomen: Nontender nondistended good bowel sounds Musculoskeletal: No edema noted Skin: No rashes seen Neurologic: CN 2-12 grossly intact.  And nonfocal Psychiatric: Normal judgment and insight. Alert and oriented x 3. Normal mood.   Objective: Vitals:   04/29/21 0344 04/29/21 1214 04/29/21 2018 04/30/21 0428  BP: 125/73 (!) 155/74 (!) 156/80 140/72  Pulse: 95 75 86 80  Resp:  20 20 (!) 21 20  Temp: 98 F (36.7 C) 98.7 F (37.1 C) 99.2 F (37.3 C) 98.2 F (36.8 C)  TempSrc:   Oral Oral   SpO2: 93% 98% 93% 94%  Weight:      Height:        Intake/Output Summary (Last 24 hours) at 04/30/2021 1137 Last data filed at 04/30/2021 0600 Gross per 24 hour  Intake 1040 ml  Output --  Net 1040 ml   Filed Weights   04/28/21 1204 04/28/21 1756  Weight: 80.6 kg 82.4 kg     Data Reviewed:   CBC: Recent Labs  Lab 04/28/21 1243 04/29/21 0510  WBC 12.1* 8.5  NEUTROABS 9.7*  --   HGB 12.6* 11.1*  HCT 38.5* 34.2*  MCV 94.1 94.7  PLT 274 948   Basic Metabolic Panel: Recent Labs  Lab 04/28/21 1243 04/29/21 0510  NA 129* 132*  K 4.4 3.7  CL 101 102  CO2 19* 24  GLUCOSE 140* 140*  BUN 16 12  CREATININE 0.94 0.72  CALCIUM 8.5* 8.5*   GFR: Estimated Creatinine Clearance: 101 mL/min (by C-G formula based on SCr of 0.72 mg/dL). Liver Function Tests: Recent Labs  Lab 04/28/21 1243 04/29/21 0510  AST 40 30  ALT 42 33  ALKPHOS 271* 206*  BILITOT 1.1 1.3*  PROT 7.7 6.7  ALBUMIN 3.0* 2.6*   Recent Labs  Lab 04/28/21 1243  LIPASE 54*   No results for input(s): AMMONIA in the last 168 hours. Coagulation Profile: Recent Labs  Lab 04/28/21 1243  INR 1.1   Cardiac Enzymes: No results for input(s): CKTOTAL, CKMB, CKMBINDEX, TROPONINI in the last 168 hours. BNP (last 3 results) No results for input(s): PROBNP in the last 8760 hours. HbA1C: No results for input(s): HGBA1C in the last 72 hours. CBG: Recent Labs  Lab 04/29/21 1114 04/29/21 1617 04/29/21 2111 04/30/21 0723 04/30/21 1110  GLUCAP 159* 139* 128* 115* 135*   Lipid Profile: No results for input(s): CHOL, HDL, LDLCALC, TRIG, CHOLHDL, LDLDIRECT in the last 72 hours. Thyroid Function Tests: No results for input(s): TSH, T4TOTAL, FREET4, T3FREE, THYROIDAB in the last 72 hours. Anemia Panel: No results for input(s): VITAMINB12, FOLATE, FERRITIN, TIBC, IRON, RETICCTPCT in the last 72 hours. Sepsis Labs: Recent Labs  Lab 04/28/21 1243 04/29/21 0510 04/30/21 0511  PROCALCITON 0.32  0.54 0.59  LATICACIDVEN 1.2  --   --     Recent Results (from the past 240 hour(s))  Resp Panel by RT-PCR (Flu A&B, Covid) Nasopharyngeal Swab     Status: None   Collection Time: 04/28/21 12:43 PM   Specimen: Nasopharyngeal Swab; Nasopharyngeal(NP) swabs in vial transport medium  Result Value Ref Range Status   SARS Coronavirus 2 by RT PCR NEGATIVE NEGATIVE Final    Comment: (NOTE) SARS-CoV-2 target nucleic acids are NOT DETECTED.  The SARS-CoV-2 RNA is generally detectable in upper respiratory specimens during the acute phase of infection. The lowest concentration of SARS-CoV-2 viral copies this assay can detect is 138 copies/mL. A negative result does not preclude SARS-Cov-2 infection and should not be used as the sole basis for treatment or other patient management decisions. A negative result may occur with  improper specimen collection/handling, submission of specimen other than nasopharyngeal swab, presence of viral mutation(s) within the areas targeted by this assay, and inadequate number of viral copies(<138 copies/mL). A negative result must be combined with clinical observations, patient history, and epidemiological information. The expected result is Negative.  Fact Sheet for  Patients:  EntrepreneurPulse.com.au  Fact Sheet for Healthcare Providers:  IncredibleEmployment.be  This test is no t yet approved or cleared by the [place] FDA and  has been authorized for detection and/or diagnosis of SARS-CoV-2 by FDA under an Emergency Use Authorization (EUA). This EUA will remain  in effect (meaning this test can be used) for the duration of the COVID-19 declaration under Section 564(b)(1) of the Act, 21 U.S.C.section 360bbb-3(b)(1), unless the authorization is terminated  or revoked sooner.       Influenza A by PCR NEGATIVE NEGATIVE Final   Influenza B by PCR NEGATIVE NEGATIVE Final    Comment: (NOTE) The Xpert Xpress  SARS-CoV-2/FLU/RSV plus assay is intended as an aid in the diagnosis of influenza from Nasopharyngeal swab specimens and should not be used as a sole basis for treatment. Nasal washings and aspirates are unacceptable for Xpert Xpress SARS-CoV-2/FLU/RSV testing.  Fact Sheet for Patients: EntrepreneurPulse.com.au  Fact Sheet for Healthcare Providers: IncredibleEmployment.be  This test is not yet approved or cleared by the [place] FDA and has been authorized for detection and/or diagnosis of SARS-CoV-2 by FDA under an Emergency Use Authorization (EUA). This EUA will remain in effect (meaning this test can be used) for the duration of the COVID-19 declaration under Section 564(b)(1) of the Act, 21 U.S.C. section 360bbb-3(b)(1), unless the authorization is terminated or revoked.  Performed at [facility], [address] [place], [place] [address]   Blood Culture (routine x 2)     Status: Abnormal (Preliminary result)   Collection Time: 04/28/21 12:43 PM   Specimen: BLOOD RIGHT HAND  Result Value Ref Range Status   Specimen Description   Final    BLOOD RIGHT HAND Performed at [facility], [address] [place], [place] [address]    Special Requests   Final    Blood Culture adequate volume BOTTLES DRAWN AEROBIC AND ANAEROBIC Performed at [facility], [address], [place], [place] [address]    Culture  Setup Time   Final    GRAM NEGATIVE RODS ANAEROBIC BOTTLE ONLY Organism ID to follow CRITICAL RESULT CALLED TO, READ BACK BY AND VERIFIED WITH: S HALL PHARMD [id number] 04/29/21 A BROWNING    Culture (A)  Final    PSEUDOMONAS AERUGINOSA SUSCEPTIBILITIES TO FOLLOW Performed at [facility], [place] [address] [place], [place] [address]    Report Status PENDING  Incomplete  Blood Culture (routine x 2)     Status: None (Preliminary result)   Collection Time: 04/28/21 12:43 PM   Specimen: BLOOD LEFT ARM  Result Value Ref Range Status    Specimen Description BLOOD LEFT ARM  Final   Special Requests   Final    Blood Culture results may not be optimal due to an excessive volume of blood received in culture bottles BOTTLES DRAWN AEROBIC AND ANAEROBIC   Culture   Final    NO GROWTH 2 DAYS Performed at [facility], [address], [place], [place] [address]    Report Status PENDING  Incomplete  Blood Culture ID Panel (Reflexed)     Status: Abnormal   Collection Time: 04/28/21 12:43 PM  Result Value Ref Range Status   Enterococcus faecalis NOT DETECTED NOT DETECTED Final   Enterococcus Faecium NOT DETECTED NOT DETECTED Final   Listeria monocytogenes NOT DETECTED NOT DETECTED Final   Staphylococcus species NOT DETECTED NOT DETECTED Final   Staphylococcus aureus (BCID) NOT DETECTED NOT DETECTED Final   Staphylococcus epidermidis NOT DETECTED NOT DETECTED Final  Staphylococcus lugdunensis NOT DETECTED NOT DETECTED Final   Streptococcus species NOT DETECTED NOT DETECTED Final   Streptococcus agalactiae NOT DETECTED NOT DETECTED Final   Streptococcus pneumoniae NOT DETECTED NOT DETECTED Final   Streptococcus pyogenes NOT DETECTED NOT DETECTED Final   A.calcoaceticus-baumannii NOT DETECTED NOT DETECTED Final   Bacteroides fragilis NOT DETECTED NOT DETECTED Final   Enterobacterales NOT DETECTED NOT DETECTED Final   Enterobacter cloacae complex NOT DETECTED NOT DETECTED Final   Escherichia coli NOT DETECTED NOT DETECTED Final   Klebsiella aerogenes NOT DETECTED NOT DETECTED Final   Klebsiella oxytoca NOT DETECTED NOT DETECTED Final   Klebsiella pneumoniae NOT DETECTED NOT DETECTED Final   Proteus species NOT DETECTED NOT DETECTED Final   Salmonella species NOT DETECTED NOT DETECTED Final   Serratia marcescens NOT DETECTED NOT DETECTED Final   Haemophilus influenzae NOT DETECTED NOT DETECTED Final   Neisseria meningitidis NOT DETECTED NOT DETECTED Final   Pseudomonas aeruginosa DETECTED (A) NOT DETECTED Final    Comment:  CRITICAL RESULT CALLED TO, READ BACK BY AND VERIFIED WITH: S HALL PHARMD 1645 04/29/21 A BROWNING    Stenotrophomonas maltophilia NOT DETECTED NOT DETECTED Final   Candida albicans NOT DETECTED NOT DETECTED Final   Candida auris NOT DETECTED NOT DETECTED Final   Candida glabrata NOT DETECTED NOT DETECTED Final   Candida krusei NOT DETECTED NOT DETECTED Final   Candida parapsilosis NOT DETECTED NOT DETECTED Final   Candida tropicalis NOT DETECTED NOT DETECTED Final   Cryptococcus neoformans/gattii NOT DETECTED NOT DETECTED Final   CTX-M ESBL NOT DETECTED NOT DETECTED Final   Carbapenem resistance IMP NOT DETECTED NOT DETECTED Final   Carbapenem resistance KPC NOT DETECTED NOT DETECTED Final   Carbapenem resistance NDM NOT DETECTED NOT DETECTED Final   Carbapenem resistance VIM NOT DETECTED NOT DETECTED Final    Comment: Performed at [facility], [address] [place], [place] [address]  Urine Culture     Status: None (Preliminary result)   Collection Time: 04/28/21  2:57 PM   Specimen: In/Out Cath Urine  Result Value Ref Range Status   Specimen Description   Final    IN/OUT CATH URINE Performed at [facility], [address], [place], [place] [address]    Special Requests   Final    NONE Performed at [facility], [address], [place], [place] [address]    Culture   Final    CULTURE REINCUBATED FOR BETTER GROWTH Performed at [facility], [place] [address] [place], [place] [address]    Report Status PENDING  Incomplete         Radiology Studies: CT Abdomen Pelvis W Contrast  Result Date: 04/28/2021 CLINICAL DATA:  Abdominal pain, fever, post-op EXAM: CT ABDOMEN AND PELVIS WITH CONTRAST TECHNIQUE: Multidetector CT imaging of the abdomen and pelvis was performed using the standard protocol following bolus administration of intravenous contrast. CONTRAST:  61mL OMNIPAQUE IOHEXOL 350 MG/ML SOLN COMPARISON:  CT March 12, 2021 FINDINGS: Inferior chest: The lung bases  are well-aerated. Hepatobiliary: There is dense metallic artifact which obscures evaluation of most of the hepatic parenchyma. Scattered dilated intrahepatic biliary ducts are again seen, left-greater-than-right. Bilateral biliary stents appear appropriately positioned. Stable distention of the gallbladder without pericholecystic inflammatory changes. Spleen: Normal in size without focal abnormality. Pancreas: No pancreatic ductal dilatation or surrounding inflammatory changes. Adrenals/Urinary Tract: Adrenal glands are unremarkable. Kidneys are normal, without renal calculi, focal lesion, or hydronephrosis. Bladder is unremarkable. Stomach/Bowel: The stomach, small bowel and  large bowel are normal in caliber without abnormal wall thickening or surrounding inflammatory changes. The appendix is normal. Reproductive: Prostate is unremarkable. Lymphatic: No enlarged lymph nodes in the abdomen or pelvis. Vasculature: The abdominal aorta is normal in caliber. Scattered fibrofatty and calcific atherosclerosis. The main and left portal veins are patent. The right portal vein is obscured. Other: No abdominopelvic ascites. Musculoskeletal: Multilevel degenerative changes particularly of the lower lumbar spine. The soft tissues are unremarkable. IMPRESSION: No new acute process identified within the abdomen or pelvis. There is new metallic density material within the right hepatic lobe presumably related to recent surgery, which obscures much of its evaluation. However, no new intrahepatic focal process identified within the limits of the exam. Stable appearance of scattered dilated intrahepatic biliary ducts. Bilateral biliary stents are in place, which appear grossly well positioned. Stable distention of the gallbladder without pericholecystic inflammatory changes. Electronically Signed   By: Albin Felling M.D.   On: 04/28/2021 14:49   DG Chest Port 1 View  Result Date: 04/28/2021 CLINICAL DATA:  Weakness, possible  sepsis EXAM: PORTABLE CHEST 1 VIEW COMPARISON:  03/11/2021 FINDINGS: The heart size and mediastinal contours are within normal limits. Both lungs are clear. The visualized skeletal structures are unremarkable. IMPRESSION: No active disease. Electronically Signed   By: Davina Poke D.O.   On: 04/28/2021 13:23        Scheduled Meds:  acetaminophen  650 mg Oral Q8H   Or   acetaminophen  650 mg Rectal Q8H   enoxaparin (LOVENOX) injection  40 mg Subcutaneous Q24H   insulin aspart  0-5 Units Subcutaneous QHS   insulin aspart  0-9 Units Subcutaneous TID WC   pantoprazole  40 mg Oral Daily   Continuous Infusions:  ceFEPime (MAXIPIME) IV 2 g (04/30/21 0521)     LOS: 2 days   Time spent= 35 mins    Lemarcus Baggerly Arsenio Loader, MD [facility]  If 7PM-7AM, please contact night-coverage  04/30/2021, 11:37 AM

## 2021-05-01 LAB — GLUCOSE, CAPILLARY
Glucose-Capillary: 121 mg/dL — ABNORMAL HIGH (ref 70–99)
Glucose-Capillary: 130 mg/dL — ABNORMAL HIGH (ref 70–99)

## 2021-05-01 LAB — CULTURE, BLOOD (ROUTINE X 2): Special Requests: ADEQUATE

## 2021-05-01 LAB — URINE CULTURE: Culture: 3000 — AB

## 2021-05-01 MED ORDER — CIPROFLOXACIN HCL 250 MG PO TABS: 750.0000 mg | ORAL_TABLET | Freq: Two times a day (BID) | ORAL | Status: DC

## 2021-05-01 MED ORDER — SACCHAROMYCES BOULARDII 250 MG PO CAPS: 250.0000 mg | ORAL_CAPSULE | Freq: Two times a day (BID) | ORAL | 1 refills | Status: AC

## 2021-05-01 MED ORDER — CIPROFLOXACIN HCL 750 MG PO TABS: 750.0000 mg | ORAL_TABLET | Freq: Two times a day (BID) | ORAL | 0 refills | Status: AC

## 2021-05-01 NOTE — Progress Notes (Signed)
Discharge instructions provided to patient and wife at bedside. Informed of prescription medications being sent over to [facility] and ambulatory referral to GI. Patient verbalized understanding of discharge instructions.

## 2021-05-01 NOTE — Discharge Summary (Signed)
Physician Discharge Summary  WALDEN STATZ MWU:[id number] DOB: May 07, 1956 DOA: 04/28/2021  PCP: [name], PA-C  Admit date: 04/28/2021 Discharge date: 05/01/2021  Admitted From: Home Disposition: Home  Recommendations for Outpatient Follow-up:  Follow up with PCP in 1-2 weeks Please obtain BMP/CBC in one week your next doctors visit.  Outpatient follow-up at [facility] gastroenterology and hepatology on June 04, 2021 Oral Cipro twice daily to be continued until followed up outpatient on 06/04/2021 Probiotics prescribed  Discharge Condition: Stable CODE STATUS: Full Diet recommendation: Diabetic  Brief/Interim Summary: 65 year old with history of cholangiocarcinoma, DM2, HTN comes to the hospital with complaints of fever and chills for 1 week.  He was prescribed antibiotics by PCP but did not tolerate this and was switched to doxycycline.  About 6 weeks ago he was admitted for similar reason diagnosed with colitis versus diverticulitis treated with Zosyn and eventually discharged on Augmentin.  For his cholangiocarcinoma, stents were placed by Dr. Melony Overly and referred to [facility], also had portal vein embolization with plans to extended right hepatectomy.  EDP discussed case with Sud[facility]ran who recommended obtaining CT which did not show any acute new process.  Procalcitonin 0.54, UA is negative.  Chest x-ray is negative.  Empiric vancomycin, Flagyl and cefepime started, GI team consulted.  Eventually cultures grew Pseudomonas therefore vancomycin and Flagyl was discontinued.  Case was discussed by GI team with [facility] who recommended continuing oral antibiotics until seen by them on June 04, 2021.  Sensitivities were reviewed and patient was discharged on oral Cipro with probiotic. Medically stable for discharge today.     Assessment & Plan:   Principal Problem:   SIRS (systemic inflammatory response syndrome) (HCC) Active Problems:   Diabetes mellitus (HCC)    Hypertension   Cholangiocarcinoma (HCC)   Hyponatremia     Pseudomonas bacteremia, likely GI source -Sepsis physiology slowly improving.  Chest x-ray, UA is negative.  CT of the A/P-negative for acute pathology, biliary stent well-positioned.  Procalcitonin 0.54 - Sensitivities reviewed, transition IV cefepime to oral Cipro until follow-up with [facility] on June 04, 2021.  Case discussed by GI team with [facility].   Cholangiocarcinoma - Status post stent and portal vein embolization.  He has been referred to [facility] for further stent placement and hepatectomy.  He has upcoming appointment in October   Uncontrolled diabetes mellitus type 2, hyperglycemia - A1c 8.7.  Insulin sliding scale and Accu-Chek - Resume home metformin   Essential hypertension - Not on any home meds    Body mass index is 24.64 kg/m.         Discharge Diagnoses:  Principal Problem:   SIRS (systemic inflammatory response syndrome) (HCC) Active Problems:   Diabetes mellitus ([place])   Hypertension   Cholangiocarcinoma ([place])   Hyponatremia      Consultations: Gastroenterology  Subjective: Feels okay no complaints.  Ambulating in the hall without any issues  Discharge Exam: Vitals:   04/30/21 2037 05/01/21 0415  BP: (!) 152/88 134/84  Pulse: 84 75  Resp: 20 20  Temp: 98.1 F (36.7 C) 98 F (36.7 C)  SpO2: 95% 93%   Vitals:   04/30/21 0428 04/30/21 1408 04/30/21 2037 05/01/21 0415  BP: 140/72 (!) 166/95 (!) 152/88 134/84  Pulse: 80 80 84 75  Resp: 20  20 20   Temp: 98.2 F (36.8 C) 98.7 F (37.1 C) 98.1 F (36.7 C) 98 F (36.7 C)  TempSrc:  Oral    SpO2: 94% 95% 95%  93%  Weight:      Height:        General: Pt is alert, awake, not in acute distress Cardiovascular: RRR, S1/S2 +, no rubs, no gallops Respiratory: CTA bilaterally, no wheezing, no rhonchi Abdominal: Soft, NT, ND, bowel sounds + Extremities: no edema, no cyanosis  Discharge Instructions   Allergies  as of 05/01/2021   No Known Allergies      Medication List     TAKE these medications    ciprofloxacin 750 MG tablet Commonly known as: CIPRO Take 1 tablet (750 mg total) by mouth 2 (two) times daily.   doxycycline 100 MG tablet Commonly known as: ADOXA Take 100 mg by mouth 2 (two) times daily.   ferrous sulfate 325 (65 FE) MG tablet Commonly known as: FerrouSul Take 1 tablet (325 mg total) by mouth daily with breakfast.   metFORMIN 500 MG tablet Commonly known as: GLUCOPHAGE Take 1 tablet by mouth 2 (two) times daily with a meal.   pantoprazole 40 MG tablet Commonly known as: Protonix Take 1 tablet (40 mg total) by mouth daily.   saccharomyces boulardii 250 MG capsule Commonly known as: Florastor Take 1 capsule (250 mg total) by mouth 2 (two) times daily.        Follow-up Information     [name], PA-C Follow up in 1 week(s).   Specialties: Physician Assistant, Internal Medicine Contact information: [place] [address] [phone]                No Known Allergies  You were cared for by a hospitalist during your hospital stay. If you have any questions about your discharge medications or the care you received while you were in the hospital after you are discharged, you can call the unit and asked to speak with the hospitalist on call if the hospitalist that took care of you is not available. Once you are discharged, your primary care physician will handle any further medical issues. Please note that no refills for any discharge medications will be authorized once you are discharged, as it is imperative that you return to your primary care physician (or establish a relationship with a primary care physician if you do not have one) for your aftercare needs so that they can reassess your need for medications and monitor your lab values.   Procedures/Studies: CT Abdomen Pelvis W Contrast  Result Date: 04/28/2021 CLINICAL  DATA:  Abdominal pain, fever, post-op EXAM: CT ABDOMEN AND PELVIS WITH CONTRAST TECHNIQUE: Multidetector CT imaging of the abdomen and pelvis was performed using the standard protocol following bolus administration of intravenous contrast. CONTRAST:  27mL OMNIPAQUE IOHEXOL 350 MG/ML SOLN COMPARISON:  CT March 12, 2021 FINDINGS: Inferior chest: The lung bases are well-aerated. Hepatobiliary: There is dense metallic artifact which obscures evaluation of most of the hepatic parenchyma. Scattered dilated intrahepatic biliary ducts are again seen, left-greater-than-right. Bilateral biliary stents appear appropriately positioned. Stable distention of the gallbladder without pericholecystic inflammatory changes. Spleen: Normal in size without focal abnormality. Pancreas: No pancreatic ductal dilatation or surrounding inflammatory changes. Adrenals/Urinary Tract: Adrenal glands are unremarkable. Kidneys are normal, without renal calculi, focal lesion, or hydronephrosis. Bladder is unremarkable. Stomach/Bowel: The stomach, small bowel and large bowel are normal in caliber without abnormal wall thickening or surrounding inflammatory changes. The appendix is normal. Reproductive: Prostate is unremarkable. Lymphatic: No enlarged lymph nodes in the abdomen or pelvis. Vasculature: The abdominal aorta is normal in caliber. Scattered fibrofatty and calcific atherosclerosis.  The main and left portal veins are patent. The right portal vein is obscured. Other: No abdominopelvic ascites. Musculoskeletal: Multilevel degenerative changes particularly of the lower lumbar spine. The soft tissues are unremarkable. IMPRESSION: No new acute process identified within the abdomen or pelvis. There is new metallic density material within the right hepatic lobe presumably related to recent surgery, which obscures much of its evaluation. However, no new intrahepatic focal process identified within the limits of the exam. Stable appearance of  scattered dilated intrahepatic biliary ducts. Bilateral biliary stents are in place, which appear grossly well positioned. Stable distention of the gallbladder without pericholecystic inflammatory changes. Electronically Signed   By: Albin Felling M.D.   On: 04/28/2021 14:49   DG Chest Port 1 View  Result Date: 04/28/2021 CLINICAL DATA:  Weakness, possible sepsis EXAM: PORTABLE CHEST 1 VIEW COMPARISON:  03/11/2021 FINDINGS: The heart size and mediastinal contours are within normal limits. Both lungs are clear. The visualized skeletal structures are unremarkable. IMPRESSION: No active disease. Electronically Signed   By: Davina Poke D.O.   On: 04/28/2021 13:23     The results of significant diagnostics from this hospitalization (including imaging, microbiology, ancillary and laboratory) are listed below for reference.     Microbiology: Recent Results (from the past 240 hour(s))  Resp Panel by RT-PCR (Flu A&B, Covid) Nasopharyngeal Swab     Status: None   Collection Time: 04/28/21 12:43 PM   Specimen: Nasopharyngeal Swab; Nasopharyngeal(NP) swabs in vial transport medium  Result Value Ref Range Status   SARS Coronavirus 2 by RT PCR NEGATIVE NEGATIVE Final    Comment: (NOTE) SARS-CoV-2 target nucleic acids are NOT DETECTED.  The SARS-CoV-2 RNA is generally detectable in upper respiratory specimens during the acute phase of infection. The lowest concentration of SARS-CoV-2 viral copies this assay can detect is 138 copies/mL. A negative result does not preclude SARS-Cov-2 infection and should not be used as the sole basis for treatment or other patient management decisions. A negative result may occur with  improper specimen collection/handling, submission of specimen other than nasopharyngeal swab, presence of viral mutation(s) within the areas targeted by this assay, and inadequate number of viral copies(<138 copies/mL). A negative result must be combined with clinical observations,  patient history, and epidemiological information. The expected result is Negative.  Fact Sheet for Patients:  EntrepreneurPulse.com.au  Fact Sheet for Healthcare Providers:  IncredibleEmployment.be  This test is no t yet approved or cleared by the [place] FDA and  has been authorized for detection and/or diagnosis of SARS-CoV-2 by FDA under an Emergency Use Authorization (EUA). This EUA will remain  in effect (meaning this test can be used) for the duration of the COVID-19 declaration under Section 564(b)(1) of the Act, 21 U.S.C.section 360bbb-3(b)(1), unless the authorization is terminated  or revoked sooner.       Influenza A by PCR NEGATIVE NEGATIVE Final   Influenza B by PCR NEGATIVE NEGATIVE Final    Comment: (NOTE) The Xpert Xpress SARS-CoV-2/FLU/RSV plus assay is intended as an aid in the diagnosis of influenza from Nasopharyngeal swab specimens and should not be used as a sole basis for treatment. Nasal washings and aspirates are unacceptable for Xpert Xpress SARS-CoV-2/FLU/RSV testing.  Fact Sheet for Patients: EntrepreneurPulse.com.au  Fact Sheet for Healthcare Providers: IncredibleEmployment.be  This test is not yet approved or cleared by the [place] FDA and has been authorized for detection and/or diagnosis of SARS-CoV-2 by FDA under an Emergency Use Authorization (EUA). This EUA will remain in  effect (meaning this test can be used) for the duration of the COVID-19 declaration under Section 564(b)(1) of the Act, 21 U.S.C. section 360bbb-3(b)(1), unless the authorization is terminated or revoked.  Performed at [facility], [address], [place], [place] [address]   Blood Culture (routine x 2)     Status: Abnormal   Collection Time: 04/28/21 12:43 PM   Specimen: BLOOD RIGHT HAND  Result Value Ref Range Status   Specimen Description   Final    BLOOD RIGHT HAND Performed at  [facility], [address], [place], [place] [id number]    Special Requests   Final    Blood Culture adequate volume BOTTLES DRAWN AEROBIC AND ANAEROBIC Performed at [facility], [address] [place], [place] [address]    Culture  Setup Time   Final    GRAM NEGATIVE RODS ANAEROBIC BOTTLE ONLY Organism ID to follow CRITICAL RESULT CALLED TO, READ BACK BY AND VERIFIED WITHJuel Burrow PHARMD 1645 04/29/21 A BROWNING Performed at [facility], [address] [place], [place] [address]    Culture PSEUDOMONAS AERUGINOSA (A)  Final   Report Status 05/01/2021 FINAL  Final   Organism ID, Bacteria PSEUDOMONAS AERUGINOSA  Final      Susceptibility   Pseudomonas aeruginosa - MIC*    CEFTAZIDIME 4 SENSITIVE Sensitive     CIPROFLOXACIN <=0.25 SENSITIVE Sensitive     GENTAMICIN <=1 SENSITIVE Sensitive     IMIPENEM 2 SENSITIVE Sensitive     PIP/TAZO 8 SENSITIVE Sensitive     CEFEPIME 2 SENSITIVE Sensitive     * PSEUDOMONAS AERUGINOSA  Blood Culture (routine x 2)     Status: None (Preliminary result)   Collection Time: 04/28/21 12:43 PM   Specimen: BLOOD LEFT ARM  Result Value Ref Range Status   Specimen Description BLOOD LEFT ARM  Final   Special Requests   Final    Blood Culture results may not be optimal due to an excessive volume of blood received in culture bottles BOTTLES DRAWN AEROBIC AND ANAEROBIC   Culture   Final    NO GROWTH 3 DAYS Performed at [facility], [address], [place],  [address]    Report Status PENDING  Incomplete  Blood Culture ID Panel (Reflexed)     Status: Abnormal   Collection Time: 04/28/21 12:43 PM  Result Value Ref Range Status   Enterococcus faecalis NOT DETECTED NOT DETECTED Final   Enterococcus Faecium NOT DETECTED NOT DETECTED Final   Listeria monocytogenes NOT DETECTED NOT DETECTED Final   Staphylococcus species NOT DETECTED NOT DETECTED Final   Staphylococcus aureus (BCID) NOT DETECTED NOT DETECTED Final   Staphylococcus epidermidis NOT  DETECTED NOT DETECTED Final   Staphylococcus lugdunensis NOT DETECTED NOT DETECTED Final   Streptococcus species NOT DETECTED NOT DETECTED Final   Streptococcus agalactiae NOT DETECTED NOT DETECTED Final   Streptococcus pneumoniae NOT DETECTED NOT DETECTED Final   Streptococcus pyogenes NOT DETECTED NOT DETECTED Final   A.calcoaceticus-baumannii NOT DETECTED NOT DETECTED Final   Bacteroides fragilis NOT DETECTED NOT DETECTED Final   Enterobacterales NOT DETECTED NOT DETECTED Final   Enterobacter cloacae complex NOT DETECTED NOT DETECTED Final   Escherichia coli NOT DETECTED NOT DETECTED Final   Klebsiella aerogenes NOT DETECTED NOT DETECTED Final   Klebsiella oxytoca NOT DETECTED NOT DETECTED Final   Klebsiella pneumoniae NOT DETECTED NOT DETECTED Final   Proteus species NOT DETECTED NOT DETECTED Final   Salmonella species NOT DETECTED NOT DETECTED Final  Serratia marcescens NOT DETECTED NOT DETECTED Final   Haemophilus influenzae NOT DETECTED NOT DETECTED Final   Neisseria meningitidis NOT DETECTED NOT DETECTED Final   Pseudomonas aeruginosa DETECTED (A) NOT DETECTED Final    Comment: CRITICAL RESULT CALLED TO, READ BACK BY AND VERIFIED WITH: S HALL PHARMD 1645 04/29/21 A BROWNING    Stenotrophomonas maltophilia NOT DETECTED NOT DETECTED Final   Candida albicans NOT DETECTED NOT DETECTED Final   Candida auris NOT DETECTED NOT DETECTED Final   Candida glabrata NOT DETECTED NOT DETECTED Final   Candida krusei NOT DETECTED NOT DETECTED Final   Candida parapsilosis NOT DETECTED NOT DETECTED Final   Candida tropicalis NOT DETECTED NOT DETECTED Final   Cryptococcus neoformans/gattii NOT DETECTED NOT DETECTED Final   CTX-M ESBL NOT DETECTED NOT DETECTED Final   Carbapenem resistance IMP NOT DETECTED NOT DETECTED Final   Carbapenem resistance KPC NOT DETECTED NOT DETECTED Final   Carbapenem resistance NDM NOT DETECTED NOT DETECTED Final   Carbapenem resistance VIM NOT DETECTED NOT  DETECTED Final    Comment: Performed at [facility], [address] [place], [place] [address]  Urine Culture     Status: Abnormal   Collection Time: 04/28/21  2:57 PM   Specimen: In/Out Cath Urine  Result Value Ref Range Status   Specimen Description   Final    IN/OUT CATH URINE Performed at [facility], [address], [place], [place] [address]    Special Requests   Final    NONE Performed at [facility], [address] [place], [place] [address]    Culture 3,000 COLONIES/mL STAPHYLOCOCCUS HAEMOLYTICUS (A)  Final   Report Status 05/01/2021 FINAL  Final   Organism ID, Bacteria STAPHYLOCOCCUS HAEMOLYTICUS (A)  Final      Susceptibility   Staphylococcus haemolyticus - MIC*    CIPROFLOXACIN >=8 RESISTANT Resistant     GENTAMICIN >=16 RESISTANT Resistant     NITROFURANTOIN <=16 SENSITIVE Sensitive     OXACILLIN >=4 RESISTANT Resistant     TETRACYCLINE 2 SENSITIVE Sensitive     VANCOMYCIN 4 SENSITIVE Sensitive     TRIMETH/SULFA >=320 RESISTANT Resistant     CLINDAMYCIN >=8 RESISTANT Resistant     RIFAMPIN >=32 RESISTANT Resistant     Inducible Clindamycin NEGATIVE Sensitive     * 3,000 COLONIES/mL STAPHYLOCOCCUS HAEMOLYTICUS     Labs: BNP (last 3 results) No results for input(s): BNP in the last 8760 hours. Basic Metabolic Panel: Recent Labs  Lab 04/28/21 1243 04/29/21 0510  NA 129* 132*  K 4.4 3.7  CL 101 102  CO2 19* 24  GLUCOSE 140* 140*  BUN 16 12  CREATININE 0.94 0.72  CALCIUM 8.5* 8.5*   Liver Function Tests: Recent Labs  Lab 04/28/21 1243 04/29/21 0510  AST 40 30  ALT 42 33  ALKPHOS 271* 206*  BILITOT 1.1 1.3*  PROT 7.7 6.7  ALBUMIN 3.0* 2.6*   Recent Labs  Lab 04/28/21 1243  LIPASE 54*   No results for input(s): AMMONIA in the last 168 hours. CBC: Recent Labs  Lab 04/28/21 1243 04/29/21 0510  WBC 12.1* 8.5  NEUTROABS 9.7*  --   HGB 12.6* 11.1*  HCT 38.5* 34.2*  MCV 94.1 94.7  PLT 274 213   Cardiac Enzymes: No results for  input(s): CKTOTAL, CKMB, CKMBINDEX, TROPONINI in the last 168 hours. BNP: Invalid input(s): POCBNP CBG: Recent Labs  Lab 04/29/21 2111 04/30/21 0723 04/30/21 1110 04/30/21 1623 05/01/21 0737  GLUCAP 128* 115* 135* 188*  121*   D-Dimer No results for input(s): DDIMER in the last 72 hours. Hgb A1c No results for input(s): HGBA1C in the last 72 hours. Lipid Profile No results for input(s): CHOL, HDL, LDLCALC, TRIG, CHOLHDL, LDLDIRECT in the last 72 hours. Thyroid function studies No results for input(s): TSH, T4TOTAL, T3FREE, THYROIDAB in the last 72 hours.  Invalid input(s): FREET3 Anemia work up No results for input(s): VITAMINB12, FOLATE, FERRITIN, TIBC, IRON, RETICCTPCT in the last 72 hours. Urinalysis    Component Value Date/Time   COLORURINE YELLOW 04/28/2021 1457   APPEARANCEUR CLEAR 04/28/2021 1457   LABSPEC 1.045 (H) 04/28/2021 1457   PHURINE 5.0 04/28/2021 1457   GLUCOSEU NEGATIVE 04/28/2021 1457   HGBUR NEGATIVE 04/28/2021 1457   BILIRUBINUR NEGATIVE 04/28/2021 1457   KETONESUR NEGATIVE 04/28/2021 1457   PROTEINUR NEGATIVE 04/28/2021 1457   NITRITE NEGATIVE 04/28/2021 1457   LEUKOCYTESUR NEGATIVE 04/28/2021 1457   Sepsis Labs Invalid input(s): PROCALCITONIN,  WBC,  LACTICIDVEN Microbiology Recent Results (from the past 240 hour(s))  Resp Panel by RT-PCR (Flu A&B, Covid) Nasopharyngeal Swab     Status: None   Collection Time: 04/28/21 12:43 PM   Specimen: Nasopharyngeal Swab; Nasopharyngeal(NP) swabs in vial transport medium  Result Value Ref Range Status   SARS Coronavirus 2 by RT PCR NEGATIVE NEGATIVE Final    Comment: (NOTE) SARS-CoV-2 target nucleic acids are NOT DETECTED.  The SARS-CoV-2 RNA is generally detectable in upper respiratory specimens during the acute phase of infection. The lowest concentration of SARS-CoV-2 viral copies this assay can detect is 138 copies/mL. A negative result does not preclude SARS-Cov-2 infection and should not be used  as the sole basis for treatment or other patient management decisions. A negative result may occur with  improper specimen collection/handling, submission of specimen other than nasopharyngeal swab, presence of viral mutation(s) within the areas targeted by this assay, and inadequate number of viral copies(<138 copies/mL). A negative result must be combined with clinical observations, patient history, and epidemiological information. The expected result is Negative.  Fact Sheet for Patients:  EntrepreneurPulse.com.au  Fact Sheet for Healthcare Providers:  IncredibleEmployment.be  This test is no t yet approved or cleared by the [place] FDA and  has been authorized for detection and/or diagnosis of SARS-CoV-2 by FDA under an Emergency Use Authorization (EUA). This EUA will remain  in effect (meaning this test can be used) for the duration of the COVID-19 declaration under Section 564(b)(1) of the Act, 21 U.S.C.section 360bbb-3(b)(1), unless the authorization is terminated  or revoked sooner.       Influenza A by PCR NEGATIVE NEGATIVE Final   Influenza B by PCR NEGATIVE NEGATIVE Final    Comment: (NOTE) The Xpert Xpress SARS-CoV-2/FLU/RSV plus assay is intended as an aid in the diagnosis of influenza from Nasopharyngeal swab specimens and should not be used as a sole basis for treatment. Nasal washings and aspirates are unacceptable for Xpert Xpress SARS-CoV-2/FLU/RSV testing.  Fact Sheet for Patients: EntrepreneurPulse.com.au  Fact Sheet for Healthcare Providers: IncredibleEmployment.be  This test is not yet approved or cleared by the [place] FDA and has been authorized for detection and/or diagnosis of SARS-CoV-2 by FDA under an Emergency Use Authorization (EUA). This EUA will remain in effect (meaning this test can be used) for the duration of the COVID-19 declaration under Section 564(b)(1)  of the Act, 21 U.S.C. section 360bbb-3(b)(1), unless the authorization is terminated or revoked.  Performed at [facility] [address], [place], [place] [address]   Blood  Culture (routine x 2)     Status: Abnormal   Collection Time: 04/28/21 12:43 PM   Specimen: BLOOD RIGHT HAND  Result Value Ref Range Status   Specimen Description   Final    BLOOD RIGHT HAND Performed at [facility], [address] [place], [place] [address]    Special Requests   Final    Blood Culture adequate volume BOTTLES DRAWN AEROBIC AND ANAEROBIC Performed at [facility], [address] [place], [place] [address]    Culture  Setup Time   Final    GRAM NEGATIVE RODS ANAEROBIC BOTTLE ONLY Organism ID to follow CRITICAL RESULT CALLED TO, READ BACK BY AND VERIFIED WITHJuel Burrow PHARMD 1645 04/29/21 A BROWNING Performed at [facility], [place] [address] [place], [place] [address]    Culture PSEUDOMONAS AERUGINOSA (A)  Final   Report Status 05/01/2021 FINAL  Final   Organism ID, Bacteria PSEUDOMONAS AERUGINOSA  Final      Susceptibility   Pseudomonas aeruginosa - MIC*    CEFTAZIDIME 4 SENSITIVE Sensitive     CIPROFLOXACIN <=0.25 SENSITIVE Sensitive     GENTAMICIN <=1 SENSITIVE Sensitive     IMIPENEM 2 SENSITIVE Sensitive     PIP/TAZO 8 SENSITIVE Sensitive     CEFEPIME 2 SENSITIVE Sensitive     * PSEUDOMONAS AERUGINOSA  Blood Culture (routine x 2)     Status: None (Preliminary result)   Collection Time: 04/28/21 12:43 PM   Specimen: BLOOD LEFT ARM  Result Value Ref Range Status   Specimen Description BLOOD LEFT ARM  Final   Special Requests   Final    Blood Culture results may not be optimal due to an excessive volume of blood received in culture bottles BOTTLES DRAWN AEROBIC AND ANAEROBIC   Culture   Final    NO GROWTH 3 DAYS Performed at [facility], [address] [place],  [address]    Report Status PENDING  Incomplete  Blood Culture ID Panel (Reflexed)     Status: Abnormal    Collection Time: 04/28/21 12:43 PM  Result Value Ref Range Status   Enterococcus faecalis NOT DETECTED NOT DETECTED Final   Enterococcus Faecium NOT DETECTED NOT DETECTED Final   Listeria monocytogenes NOT DETECTED NOT DETECTED Final   Staphylococcus species NOT DETECTED NOT DETECTED Final   Staphylococcus aureus (BCID) NOT DETECTED NOT DETECTED Final   Staphylococcus epidermidis NOT DETECTED NOT DETECTED Final   Staphylococcus lugdunensis NOT DETECTED NOT DETECTED Final   Streptococcus species NOT DETECTED NOT DETECTED Final   Streptococcus agalactiae NOT DETECTED NOT DETECTED Final   Streptococcus pneumoniae NOT DETECTED NOT DETECTED Final   Streptococcus pyogenes NOT DETECTED NOT DETECTED Final   A.calcoaceticus-baumannii NOT DETECTED NOT DETECTED Final   Bacteroides fragilis NOT DETECTED NOT DETECTED Final   Enterobacterales NOT DETECTED NOT DETECTED Final   Enterobacter cloacae complex NOT DETECTED NOT DETECTED Final   Escherichia coli NOT DETECTED NOT DETECTED Final   Klebsiella aerogenes NOT DETECTED NOT DETECTED Final   Klebsiella oxytoca NOT DETECTED NOT DETECTED Final   Klebsiella pneumoniae NOT DETECTED NOT DETECTED Final   Proteus species NOT DETECTED NOT DETECTED Final   Salmonella species NOT DETECTED NOT DETECTED Final   Serratia marcescens NOT DETECTED NOT DETECTED Final   Haemophilus influenzae NOT DETECTED NOT DETECTED Final   Neisseria meningitidis NOT DETECTED NOT DETECTED Final   Pseudomonas aeruginosa DETECTED (A) NOT DETECTED Final    Comment: CRITICAL RESULT CALLED TO, READ BACK BY AND VERIFIED  WITH: Juel Burrow PHARMD [id number] 04/29/21 A BROWNING    Stenotrophomonas maltophilia NOT DETECTED NOT DETECTED Final   Candida albicans NOT DETECTED NOT DETECTED Final   Candida auris NOT DETECTED NOT DETECTED Final   Candida glabrata NOT DETECTED NOT DETECTED Final   Candida krusei NOT DETECTED NOT DETECTED Final   Candida parapsilosis NOT DETECTED NOT DETECTED Final    Candida tropicalis NOT DETECTED NOT DETECTED Final   Cryptococcus neoformans/gattii NOT DETECTED NOT DETECTED Final   CTX-M ESBL NOT DETECTED NOT DETECTED Final   Carbapenem resistance IMP NOT DETECTED NOT DETECTED Final   Carbapenem resistance KPC NOT DETECTED NOT DETECTED Final   Carbapenem resistance NDM NOT DETECTED NOT DETECTED Final   Carbapenem resistance VIM NOT DETECTED NOT DETECTED Final    Comment: Performed at [facility] [address] [place], [place] [address]  Urine Culture     Status: Abnormal   Collection Time: 04/28/21  2:57 PM   Specimen: In/Out Cath Urine  Result Value Ref Range Status   Specimen Description   Final    IN/OUT CATH URINE Performed at [facility], [address] [place], [place] [address]    Special Requests   Final    NONE Performed at [facility], [address], [place], [place] [address]    Culture 3,000 COLONIES/mL STAPHYLOCOCCUS HAEMOLYTICUS (A)  Final   Report Status 05/01/2021 FINAL  Final   Organism ID, Bacteria STAPHYLOCOCCUS HAEMOLYTICUS (A)  Final      Susceptibility   Staphylococcus haemolyticus - MIC*    CIPROFLOXACIN >=8 RESISTANT Resistant     GENTAMICIN >=16 RESISTANT Resistant     NITROFURANTOIN <=16 SENSITIVE Sensitive     OXACILLIN >=4 RESISTANT Resistant     TETRACYCLINE 2 SENSITIVE Sensitive     VANCOMYCIN 4 SENSITIVE Sensitive     TRIMETH/SULFA >=320 RESISTANT Resistant     CLINDAMYCIN >=8 RESISTANT Resistant     RIFAMPIN >=32 RESISTANT Resistant     Inducible Clindamycin NEGATIVE Sensitive     * 3,000 COLONIES/mL STAPHYLOCOCCUS HAEMOLYTICUS     Time coordinating discharge:  I have spent 35 minutes face to face with the patient and on the ward discussing the patients care, assessment, plan and disposition with other care givers. >50% of the time was devoted counseling the patient about the risks and benefits of treatment/Discharge disposition and coordinating care.   SIGNED:   Damita Lack,  MD  [facility] 05/01/2021, 10:55 AM   If 7PM-7AM, please contact night-coverage

## 2021-05-03 LAB — CULTURE, BLOOD (ROUTINE X 2): Culture: NO GROWTH

## 2021-05-15 ENCOUNTER — Telehealth (INDEPENDENT_AMBULATORY_CARE_PROVIDER_SITE_OTHER): Payer: Self-pay | Admitting: *Deleted

## 2021-05-15 NOTE — Telephone Encounter (Signed)
Wife Jonathan Keith called states pt's doxy was changed to doxy with hyclate and since then he has nausea after eating. Last for about 1-2 hours. Also taking cipro. States he has bile duct cancer. [facility]. Per dr Laural Golden. Stop doxy and call [facility] and see what they recommend. Discussed with pt's wife and she verbalized understanding.

## 2021-05-25 ENCOUNTER — Other Ambulatory Visit: Payer: Self-pay

## 2021-05-25 ENCOUNTER — Encounter (HOSPITAL_COMMUNITY): Payer: Self-pay | Admitting: *Deleted

## 2021-05-25 ENCOUNTER — Emergency Department (HOSPITAL_COMMUNITY): Payer: Medicare Other

## 2021-05-25 ENCOUNTER — Inpatient Hospital Stay (HOSPITAL_COMMUNITY)
Admission: EM | Admit: 2021-05-25 | Discharge: 2021-05-29 | DRG: 871 | Disposition: A | Discharge status: Home / Self Care | Payer: Medicare Other | Attending: Internal Medicine | Admitting: Internal Medicine

## 2021-05-25 DIAGNOSIS — A419 Sepsis, unspecified organism: Secondary | ICD-10-CM | POA: Diagnosis not present

## 2021-05-25 DIAGNOSIS — M549 Dorsalgia, unspecified: Secondary | ICD-10-CM | POA: Diagnosis present

## 2021-05-25 DIAGNOSIS — E6609 Other obesity due to excess calories: Secondary | ICD-10-CM | POA: Diagnosis present

## 2021-05-25 DIAGNOSIS — M5030 Other cervical disc degeneration, unspecified cervical region: Secondary | ICD-10-CM | POA: Diagnosis present

## 2021-05-25 DIAGNOSIS — G8929 Other chronic pain: Secondary | ICD-10-CM | POA: Diagnosis present

## 2021-05-25 DIAGNOSIS — K219 Gastro-esophageal reflux disease without esophagitis: Secondary | ICD-10-CM | POA: Diagnosis present

## 2021-05-25 DIAGNOSIS — Z79899 Other long term (current) drug therapy: Secondary | ICD-10-CM

## 2021-05-25 DIAGNOSIS — E1151 Type 2 diabetes mellitus with diabetic peripheral angiopathy without gangrene: Secondary | ICD-10-CM | POA: Diagnosis present

## 2021-05-25 DIAGNOSIS — E871 Hypo-osmolality and hyponatremia: Secondary | ICD-10-CM | POA: Diagnosis present

## 2021-05-25 DIAGNOSIS — K429 Umbilical hernia without obstruction or gangrene: Secondary | ICD-10-CM | POA: Diagnosis present

## 2021-05-25 DIAGNOSIS — K8309 Other cholangitis: Secondary | ICD-10-CM | POA: Diagnosis present

## 2021-05-25 DIAGNOSIS — I10 Essential (primary) hypertension: Secondary | ICD-10-CM | POA: Diagnosis present

## 2021-05-25 DIAGNOSIS — Z20822 Contact with and (suspected) exposure to covid-19: Secondary | ICD-10-CM | POA: Diagnosis present

## 2021-05-25 DIAGNOSIS — C221 Intrahepatic bile duct carcinoma: Secondary | ICD-10-CM | POA: Diagnosis present

## 2021-05-25 DIAGNOSIS — A4181 Sepsis due to Enterococcus: Secondary | ICD-10-CM | POA: Diagnosis not present

## 2021-05-25 DIAGNOSIS — Z7984 Long term (current) use of oral hypoglycemic drugs: Secondary | ICD-10-CM

## 2021-05-25 DIAGNOSIS — K831 Obstruction of bile duct: Secondary | ICD-10-CM | POA: Diagnosis present

## 2021-05-25 DIAGNOSIS — R7989 Other specified abnormal findings of blood chemistry: Secondary | ICD-10-CM | POA: Diagnosis not present

## 2021-05-25 DIAGNOSIS — M25561 Pain in right knee: Secondary | ICD-10-CM | POA: Diagnosis present

## 2021-05-25 DIAGNOSIS — E785 Hyperlipidemia, unspecified: Secondary | ICD-10-CM | POA: Diagnosis present

## 2021-05-25 DIAGNOSIS — B952 Enterococcus as the cause of diseases classified elsewhere: Secondary | ICD-10-CM | POA: Diagnosis not present

## 2021-05-25 DIAGNOSIS — E119 Type 2 diabetes mellitus without complications: Secondary | ICD-10-CM

## 2021-05-25 DIAGNOSIS — E118 Type 2 diabetes mellitus with unspecified complications: Secondary | ICD-10-CM

## 2021-05-25 DIAGNOSIS — M25562 Pain in left knee: Secondary | ICD-10-CM | POA: Diagnosis present

## 2021-05-25 DIAGNOSIS — Z8249 Family history of ischemic heart disease and other diseases of the circulatory system: Secondary | ICD-10-CM

## 2021-05-25 DIAGNOSIS — Z6831 Body mass index (BMI) 31.0-31.9, adult: Secondary | ICD-10-CM | POA: Diagnosis not present

## 2021-05-25 DIAGNOSIS — R7881 Bacteremia: Secondary | ICD-10-CM | POA: Diagnosis not present

## 2021-05-25 DIAGNOSIS — F17210 Nicotine dependence, cigarettes, uncomplicated: Secondary | ICD-10-CM | POA: Diagnosis present

## 2021-05-25 DIAGNOSIS — E1165 Type 2 diabetes mellitus with hyperglycemia: Secondary | ICD-10-CM | POA: Diagnosis present

## 2021-05-25 DIAGNOSIS — R509 Fever, unspecified: Secondary | ICD-10-CM | POA: Diagnosis not present

## 2021-05-25 LAB — CBC WITH DIFFERENTIAL/PLATELET
Abs Immature Granulocytes: 0.06 10*3/uL (ref 0.00–0.07)
Basophils Absolute: 0.1 10*3/uL (ref 0.0–0.1)
Basophils Relative: 0 %
Eosinophils Absolute: 0.2 10*3/uL (ref 0.0–0.5)
Eosinophils Relative: 1 %
HCT: 38.2 % — ABNORMAL LOW (ref 39.0–52.0)
Hemoglobin: 13 g/dL (ref 13.0–17.0)
Immature Granulocytes: 1 %
Lymphocytes Relative: 5 %
Lymphs Abs: 0.6 10*3/uL — ABNORMAL LOW (ref 0.7–4.0)
MCH: 30.4 pg (ref 26.0–34.0)
MCHC: 34 g/dL (ref 30.0–36.0)
MCV: 89.5 fL (ref 80.0–100.0)
Monocytes Absolute: 0.8 10*3/uL (ref 0.1–1.0)
Monocytes Relative: 7 %
Neutro Abs: 10.2 10*3/uL — ABNORMAL HIGH (ref 1.7–7.7)
Neutrophils Relative %: 86 %
Platelets: 259 10*3/uL (ref 150–400)
RBC: 4.27 MIL/uL (ref 4.22–5.81)
RDW: 14.6 % (ref 11.5–15.5)
WBC: 11.9 10*3/uL — ABNORMAL HIGH (ref 4.0–10.5)
nRBC: 0 % (ref 0.0–0.2)

## 2021-05-25 LAB — COMPREHENSIVE METABOLIC PANEL
ALT: 109 U/L — ABNORMAL HIGH (ref 0–44)
AST: 91 U/L — ABNORMAL HIGH (ref 15–41)
Albumin: 3 g/dL — ABNORMAL LOW (ref 3.5–5.0)
Alkaline Phosphatase: 716 U/L — ABNORMAL HIGH (ref 38–126)
Anion gap: 8 (ref 5–15)
BUN: 12 mg/dL (ref 8–23)
CO2: 22 mmol/L (ref 22–32)
Calcium: 8.7 mg/dL — ABNORMAL LOW (ref 8.9–10.3)
Chloride: 99 mmol/L (ref 98–111)
Creatinine, Ser: 0.87 mg/dL (ref 0.61–1.24)
GFR, Estimated: >60 mL/min (ref >60)
Glucose, Bld: 201 mg/dL — ABNORMAL HIGH (ref 70–99)
Potassium: 4.1 mmol/L (ref 3.5–5.1)
Sodium: 129 mmol/L — ABNORMAL LOW (ref 135–145)
Total Bilirubin: 4.6 mg/dL — ABNORMAL HIGH (ref 0.3–1.2)
Total Protein: 7.4 g/dL (ref 6.5–8.1)

## 2021-05-25 LAB — URINALYSIS, ROUTINE W REFLEX MICROSCOPIC
Bacteria, UA: NONE SEEN
Glucose, UA: NEGATIVE mg/dL
Hgb urine dipstick: NEGATIVE
Ketones, ur: 5 mg/dL — AB
Leukocytes,Ua: NEGATIVE
Nitrite: NEGATIVE
Protein, ur: 100 mg/dL — AB
Specific Gravity, Urine: 1.033 — ABNORMAL HIGH (ref 1.005–1.030)
pH: 5 (ref 5.0–8.0)

## 2021-05-25 LAB — RESP PANEL BY RT-PCR (FLU A&B, COVID) ARPGX2
Influenza A by PCR: NEGATIVE
Influenza B by PCR: NEGATIVE
SARS Coronavirus 2 by RT PCR: NEGATIVE

## 2021-05-25 LAB — LACTIC ACID, PLASMA
Lactic Acid, Venous: 2 mmol/L (ref 0.5–1.9)
Lactic Acid, Venous: 1.8 mmol/L (ref 0.5–1.9)

## 2021-05-25 LAB — CBG MONITORING, ED: Glucose-Capillary: 160 mg/dL — ABNORMAL HIGH (ref 70–99)

## 2021-05-25 LAB — PROTIME-INR
INR: 1 (ref 0.8–1.2)
Prothrombin Time: 12.8 seconds (ref 11.4–15.2)

## 2021-05-25 LAB — APTT: aPTT: 29 seconds (ref 24–36)

## 2021-05-25 MED ORDER — ACETAMINOPHEN 325 MG PO TABS
650.0000 mg | ORAL_TABLET | Freq: Four times a day (QID) | ORAL | Status: DC | PRN
  Administered 2021-05-26: 650 mg via ORAL
  Filled 2021-05-25: qty 2

## 2021-05-25 MED ORDER — LACTATED RINGERS IV SOLN: INTRAVENOUS | Status: DC

## 2021-05-25 MED ORDER — INSULIN ASPART 100 UNIT/ML IJ SOLN
0.0000 [IU] | Freq: Every day | INTRAMUSCULAR | Status: DC
  Administered 2021-05-28: 2 [IU] via SUBCUTANEOUS

## 2021-05-25 MED ORDER — IOHEXOL 300 MG/ML  SOLN
100.0000 mL | Freq: Once | INTRAMUSCULAR | Status: AC | PRN
  Administered 2021-05-25: 100 mL via INTRAVENOUS

## 2021-05-25 MED ORDER — ACETAMINOPHEN 650 MG RE SUPP: 650.0000 mg | Freq: Four times a day (QID) | RECTAL | Status: DC | PRN

## 2021-05-25 MED ORDER — LACTATED RINGERS IV BOLUS (SEPSIS)
1000.0000 mL | Freq: Once | INTRAVENOUS | Status: AC
  Administered 2021-05-25: 1000 mL via INTRAVENOUS

## 2021-05-25 MED ORDER — POLYETHYLENE GLYCOL 3350 17 G PO PACK: 17.0000 g | PACK | Freq: Every day | ORAL | Status: DC | PRN

## 2021-05-25 MED ORDER — ONDANSETRON HCL 4 MG/2ML IJ SOLN: 4.0000 mg | Freq: Four times a day (QID) | INTRAMUSCULAR | Status: DC | PRN

## 2021-05-25 MED ORDER — SODIUM CHLORIDE 0.9 % IV SOLN: INTRAVENOUS | Status: AC

## 2021-05-25 MED ORDER — SODIUM CHLORIDE 0.9 % IV SOLN
2.0000 g | Freq: Once | INTRAVENOUS | Status: AC
  Administered 2021-05-25: 2 g via INTRAVENOUS
  Filled 2021-05-25: qty 2

## 2021-05-25 MED ORDER — INSULIN ASPART 100 UNIT/ML IJ SOLN
0.0000 [IU] | Freq: Three times a day (TID) | INTRAMUSCULAR | Status: DC
Start: 2021-05-26 — End: 2021-05-29
  Administered 2021-05-26 – 2021-05-29 (×4): 1 [IU] via SUBCUTANEOUS

## 2021-05-25 MED ORDER — ONDANSETRON HCL 4 MG PO TABS: 4.0000 mg | ORAL_TABLET | Freq: Four times a day (QID) | ORAL | Status: DC | PRN

## 2021-05-25 MED ORDER — VANCOMYCIN HCL 1500 MG/300ML IV SOLN
1500.0000 mg | Freq: Once | INTRAVENOUS | Status: AC
  Administered 2021-05-25: 1500 mg via INTRAVENOUS
  Filled 2021-05-25: qty 300

## 2021-05-25 MED ORDER — VANCOMYCIN HCL IN DEXTROSE 1-5 GM/200ML-% IV SOLN
1000.0000 mg | Freq: Two times a day (BID) | INTRAVENOUS | Status: DC
  Administered 2021-05-26: 1000 mg via INTRAVENOUS
  Filled 2021-05-25: qty 200

## 2021-05-25 MED ORDER — SODIUM CHLORIDE 0.9 % IV SOLN
2.0000 g | Freq: Three times a day (TID) | INTRAVENOUS | Status: DC
  Administered 2021-05-25 – 2021-05-26 (×3): 2 g via INTRAVENOUS
  Filled 2021-05-25 (×3): qty 2

## 2021-05-25 MED ORDER — VANCOMYCIN HCL IN DEXTROSE 1-5 GM/200ML-% IV SOLN: 1000.0000 mg | Freq: Once | INTRAVENOUS | Status: DC

## 2021-05-25 MED ORDER — METRONIDAZOLE 500 MG/100ML IV SOLN
500.0000 mg | Freq: Once | INTRAVENOUS | Status: AC
  Administered 2021-05-25: 500 mg via INTRAVENOUS
  Filled 2021-05-25: qty 100

## 2021-05-25 MED ORDER — PANTOPRAZOLE SODIUM 40 MG PO TBEC
40.0000 mg | DELAYED_RELEASE_TABLET | Freq: Every day | ORAL | Status: DC
  Administered 2021-05-26 – 2021-05-29 (×3): 40 mg via ORAL
  Filled 2021-05-25 (×4): qty 1

## 2021-05-25 MED ORDER — ACETAMINOPHEN 500 MG PO TABS
500.0000 mg | ORAL_TABLET | Freq: Once | ORAL | Status: AC
  Administered 2021-05-25: 500 mg via ORAL
  Filled 2021-05-25: qty 1

## 2021-05-25 MED ORDER — METRONIDAZOLE 500 MG/100ML IV SOLN
500.0000 mg | Freq: Two times a day (BID) | INTRAVENOUS | Status: DC
  Administered 2021-05-26 (×2): 500 mg via INTRAVENOUS
  Filled 2021-05-25 (×2): qty 100

## 2021-05-25 NOTE — H&P (Addendum)
History and Physical    [name] DPO:[id number] DOB: 05/06/1956 DOA: 05/25/2021  PCP: [name], PA-C   Patient coming from: Home  I have personally briefly reviewed patient's old medical records in [place]  Chief Complaint: Fever  HPI: Jonathan Keith is a 65 y.o. male with medical history significant for cholangiocarcinoma, diabetes mellitus, hypertension.  Patient presented to the ED again today with complaints of fevers that started today.  Patient has had a complicated course over the past few months related to his cholangiocarcinoma and stent placements.  With recent hospitalizations for fever.    Most recent hospitalization 9/19-9/22-for sepsis due to Pseudomonas bacteremia.  Thought to be of GI source.  Per Dr. Olevia Perches note, patient's last hospitalization, this is not a common organism seen with cholangitis, but has been reported with repeated instrumentation. Was treated with IV cefepime while hospitalized and transition to oral ciprofloxacin on discharge.  Patient tells me he was compliant with the medication and took the medication for 10 days after discharge, but I see that 68 tablets were prescribed.  He denies any other symptoms, no abdominal pain no vomiting no loose stools no cough no difficulty breathing, no pain with urination.  Patient was diagnosed with cholangiocarcinoma July 2022, with stents placed involving the right system here, and then he was referred to [facility], where he had repeat ERCP with biliary stenting to the left and right system 03/21/2021.  He underwent right portal vein embolization to accelerate left liver lobe hypertrophy, prior to surgery planned for this October. Patient is supposed to travel back to [place] on the 20th of this month, in 4 days time.  Per last documentation 9/19 per [facility], right extended hepatectomy is planned for 10/26.  ED Course: Febrile to 100.5.  Tachycardic heart rate 102-125.  Respiratory  rate 17-18.  Blood pressure systolic 431-540.  O2 sats greater than 93% on room air.  WBC 11.9.  Lactic acid 2 > 1.8.  Sodium 129.  Elevated liver enzymes AST 91, ALT 109, ALP 716, total bilirubin 4.6. Abdominal CT with contrast without new acute process, colonic diverticulosis without acute diverticulitis, stable bilobar biliary stents, with scattered dilated intrahepatic bile ducts. Broad-spectrum antibiotics Vanco cefepime and metronidazole started.  Hospitalist to admit.   Review of Systems: As per HPI all other systems reviewed and negative.  Past Medical History:  Diagnosis Date   Chronic pain    neck, back, knees   Class 1 obesity due to excess calories with body mass index (BMI) of 31.0 to 31.9 in adult    Claudication [facility])    DDD (degenerative disc disease), cervical    Diabetes mellitus ([place])    Dyslipidemia    Hypertension    Tobacco dependence     Past Surgical History:  Procedure Laterality Date   BILIARY BRUSHING N/A 02/25/2021   Procedure: BILIARY BRUSHING;  Surgeon: Rogene Houston, MD;  Location: [facility];  Service: Gastroenterology;  Laterality: N/A;   BILIARY STENT PLACEMENT N/A 02/25/2021   Procedure: BILIARY STENT PLACEMENT;  Surgeon: Rogene Houston, MD;  Location: [facility];  Service: Gastroenterology;  Laterality: N/A;   BILIARY STENT PLACEMENT  02/27/2021   Procedure: BILIARY STENT PLACEMENT (10FR x 9cm) IN THE RIGHT SYSTEM;  Surgeon: Rogene Houston, MD;  Location: [facility];  Service: Gastroenterology;;   BREAST SURGERY Left    benign lump- in his 36s.   ERCP N/A 02/25/2021   Procedure: ENDOSCOPIC RETROGRADE CHOLANGIOPANCREATOGRAPHY (ERCP);  Surgeon: Rogene Houston, MD;  Location: [facility];  Service: Gastroenterology;  Laterality: N/A;   ERCP N/A 02/27/2021   Procedure: ENDOSCOPIC RETROGRADE CHOLANGIOPANCREATOGRAPHY (ERCP);  Surgeon: Rogene Houston, MD;  Location: [facility];  Service: Gastroenterology;  Laterality: N/A;   KNEE SURGERY Left    x2   SPHINCTEROTOMY  N/A 02/25/2021   Procedure: SPHINCTEROTOMY;  Surgeon: Rogene Houston, MD;  Location: [facility];  Service: Gastroenterology;  Laterality: N/A;   STENT REMOVAL  02/27/2021   Procedure: STENT REMOVAL (8.5Fr x 9cm);  Surgeon: Rogene Houston, MD;  Location: [facility];  Service: Gastroenterology;;     reports that he has been smoking cigarettes. He has a 28.00 pack-year smoking history. He has never used smokeless tobacco. He reports that he does not currently use alcohol. He reports that he does not use drugs.  No Known Allergies  Family History  Problem Relation Age of Onset   CAD Mother    Cancer Neg Hx     Prior to Admission medications   Medication Sig Start Date End Date Taking? Authorizing Provider  ciprofloxacin (CIPRO) 750 MG tablet Take 1 tablet (750 mg total) by mouth 2 (two) times daily. 05/01/21 06/04/21 Yes Amin, Jeanella Flattery, MD  metFORMIN (GLUCOPHAGE) 500 MG tablet Take 1 tablet by mouth daily with breakfast. 03/10/21  Yes [provider]  pantoprazole (PROTONIX) 40 MG tablet Take 1 tablet (40 mg total) by mouth daily. 02/28/21 02/28/22 Yes Barton Dubois, MD  doxycycline (ADOXA) 100 MG tablet Take 100 mg by mouth 2 (two) times daily. Patient not taking: Reported on 05/25/2021 04/23/21   [provider]  ferrous sulfate (FERROUSUL) 325 (65 FE) MG tablet Take 1 tablet (325 mg total) by mouth daily with breakfast. Patient not taking: Reported on 05/25/2021 03/18/21   Rogene Houston, MD  saccharomyces boulardii (FLORASTOR) 250 MG capsule Take 1 capsule (250 mg total) by mouth 2 (two) times daily. Patient not taking: No sig reported 05/01/21 06/30/21  Damita Lack, MD    Physical Exam: Vitals:   05/25/21 1452 05/25/21 1529 05/25/21 1651 05/25/21 1757  BP:  (!) 154/79 (!) 149/79 113/72  Pulse:  (!) 107 (!) 102 (!) 107  Resp:  18 17 17   Temp:   (!) 100.4 F (38 C) 99.9 F (37.7 C)  TempSrc:   Oral Oral  SpO2:  96% 95% 93%  Weight:      Height: 5\' 11"  (1.803  m)       Constitutional: NAD, calm, comfortable Vitals:   05/25/21 1452 05/25/21 1529 05/25/21 1651 05/25/21 1757  BP:  (!) 154/79 (!) 149/79 113/72  Pulse:  (!) 107 (!) 102 (!) 107  Resp:  18 17 17   Temp:   (!) 100.4 F (38 C) 99.9 F (37.7 C)  TempSrc:   Oral Oral  SpO2:  96% 95% 93%  Weight:      Height: 5\' 11"  (1.803 m)      Eyes: PERRL, lids and conjunctivae normal ENMT: Mucous membranes are moist.  Neck: normal, supple, no masses, no thyromegaly Respiratory: clear to auscultation bilaterally, no wheezing, no crackles. Normal respiratory effort. No accessory muscle use.  Cardiovascular: Regular rate and rhythm, no murmurs / rubs / gallops. No extremity edema. 2+ pedal pulses.   Abdomen: no tenderness, no masses palpated. No hepatosplenomegaly. Bowel sounds positive.  Musculoskeletal: no clubbing / cyanosis. No joint deformity upper and lower extremities. Good ROM, no contractures. Normal muscle tone.  Skin: no rashes, lesions,  ulcers. No induration Neurologic: No apparent cranial formality, moving extremities spontaneously. Psychiatric: Normal judgment and insight. Alert and oriented x 3. Normal mood.   Labs on Admission: I have personally reviewed following labs and imaging studies  CBC: Recent Labs  Lab 05/25/21 1412  WBC 11.9*  NEUTROABS 10.2*  HGB 13.0  HCT 38.2*  MCV 89.5  PLT 967   Basic Metabolic Panel: Recent Labs  Lab 05/25/21 1412  NA 129*  K 4.1  CL 99  CO2 22  GLUCOSE 201*  BUN 12  CREATININE 0.87  CALCIUM 8.7*   Liver Function Tests: Recent Labs  Lab 05/25/21 1412  AST 91*  ALT 109*  ALKPHOS 716*  BILITOT 4.6*  PROT 7.4  ALBUMIN 3.0*   Coagulation Profile: Recent Labs  Lab 05/25/21 1412  INR 1.0   Urine analysis:    Component Value Date/Time   COLORURINE AMBER (A) 05/25/2021 1412   APPEARANCEUR HAZY (A) 05/25/2021 1412   LABSPEC 1.033 (H) 05/25/2021 1412   PHURINE 5.0 05/25/2021 1412   GLUCOSEU NEGATIVE 05/25/2021 1412    HGBUR NEGATIVE 05/25/2021 1412   BILIRUBINUR MODERATE (A) 05/25/2021 1412   KETONESUR 5 (A) 05/25/2021 1412   PROTEINUR 100 (A) 05/25/2021 1412   NITRITE NEGATIVE 05/25/2021 1412   LEUKOCYTESUR NEGATIVE 05/25/2021 1412    Radiological Exams on Admission: CT Abdomen Pelvis W Contrast  Result Date: 05/25/2021 CLINICAL DATA:  Abdominal abscess/infection suspected EXAM: CT ABDOMEN AND PELVIS WITH CONTRAST TECHNIQUE: Multidetector CT imaging of the abdomen and pelvis was performed using the standard protocol following bolus administration of intravenous contrast. CONTRAST:  151mL OMNIPAQUE IOHEXOL 300 MG/ML  SOLN COMPARISON:  CT April 28, 2021 FINDINGS: Lower chest: No acute abnormality. Hepatobiliary: Streak artifact from multifocal hypertense metallic material in the right hepatic lobe obscures evaluation. Bilobar biliary stents appear appropriate in positioning. Scattered dilated intrahepatic bile ducts are again seen. Stable distension of the gallbladder without pericholecystic fluid or adjacent inflammatory change. Pancreas: No pancreatic ductal dilation or evidence of acute inflammation. Spleen: Within normal limits. Adrenals/Urinary Tract: Adrenal glands are unremarkable. Kidneys are normal, without renal calculi, solid enhancing lesion, or hydronephrosis. Bladder is unremarkable for degree of distension. Stomach/Bowel: Stomach is predominantly decompressed. No pathologic dilation of small or large bowel. The appendix and terminal ileum appear normal. Colonic diverticulosis without findings of acute diverticulitis. Vascular/Lymphatic: Aortic and branch vessel atherosclerosis without abdominal aortic aneurysm. No pathologically enlarged abdominal or pelvic lymph nodes. Reproductive: Prostate is unremarkable. Other: Small fat containing paraumbilical hernia. No abdominopelvic ascites. No walled off fluid collections. No pneumoperitoneum. Musculoskeletal: Multilevel degenerative changes spine. No  acute osseous abnormality. IMPRESSION: 1. No new acute process in the abdomen or pelvis identified. 2. Streak artifact from hyperdense metallic material in the right hepatic lobes obscures evaluation. Within this context there is no new intrahepatic focal process identified. Colonic diverticulosis without findings of acute diverticulitis. 3. Stable bilobar biliary stents with scattered dilated intrahepatic bile ducts. 4. Aortic Atherosclerosis (ICD10-I70.0). Electronically Signed   By: Dahlia Bailiff M.D.   On: 05/25/2021 15:23   DG Chest Port 1 View  Result Date: 05/25/2021 CLINICAL DATA:  Questionable sepsis EXAM: PORTABLE CHEST 1 VIEW COMPARISON:  04/28/2021 FINDINGS: The heart size and mediastinal contours are within normal limits. Both lungs are clear. The visualized skeletal structures are unremarkable. IMPRESSION: No acute abnormality of the lungs in AP portable projection. Electronically Signed   By: Delanna Ahmadi M.D.   On: 05/25/2021 15:24    EKG: Independently reviewed.  Sinus  tachycardia rate 104.  QTc 444.  No significant change from prior.  Assessment/Plan Principal Problem:   Sepsis ([place]) Active Problems:   Diabetes mellitus ([place])   Hypertension   Elevated LFTs   Cholangiocarcinoma ([place])   Acute febrile illness   Hyponatremia   SIRS-meeting SIRS criteria again without obvious focus of infection identified at this time.  Recent hospitalization with similar presentation, diagnosed with Pseudomonas bacteremia, discharged on ciprofloxacin, he tells me he took this 2ce daily for 10 days, but per chart review 68 tablets were prescribed. -Meeting SIRS criteria with fever to 100.5, tachycardic 102 225, leukocytosis 11.9.  Lactic acid 2 > 1.8.  -  Chest x-ray clear, UA not suggestive, CT abdomen and pelvis with contrast-no new acute process identified, stable biliary stents with scattered dilated intrahepatic bile ducts.. -Continue broad-spectrum antibiotics with Vanco, cefepime and  metronidazole - 3L RL bolus given, continue N/s 100cc/hr x 15hrs -CBC, CMP in the morning -GI consult in the morning -Follow up Blood and urine cultures  Elevated liver enzymes, AST 91, ALT 109, ALP 7116.  Total bilirubin 4.6.  CT findings as above. - GI consult   Cholangiocarcinoma-  S/p stents, and now portal vein embolization to help with left hepatic lobe hypertrophy with plans for extended right hepatectomy proposed date, October 26 at [facility] in [place].  Patient was supposed to travel 10/20 to [place]. -GI consult in the morning -Pending GI eval, patient may need to cancel flight plans   Hyponatremia-sodium 129, baseline 129-135. - Hydrate   Uncontrolled diabetes mellitus-random glucose 201.  A1c 8.7. - SSI- s -Hold Home metformin   Hypertension-stable.  Not on medication.   DVT prophylaxis: SCDS for now pending GI eval Code Status: Full code Family Communication: None at bedside Disposition Plan:  > 2 days Consults called: GI Admission status: Inpt, tele  I certify that at the point of admission it is my clinical judgment that the patient will require inpatient hospital care spanning beyond 2 midnights from the point of admission due to high intensity of service, high risk for further deterioration and high frequency of surveillance required.    Bethena Roys MD [facility]  05/25/2021, 8:56 PM

## 2021-05-25 NOTE — Progress Notes (Signed)
Pharmacy Antibiotic Note  Jonathan Keith is a 65 y.o. male admitted on 05/25/2021 with  unknown source .  Pharmacy has been consulted for Vancomycin and cefepime dosing.  Plan: Cefepime 2gm IV q8h Vancomycin 1500mg  IV loading dose, then 1000mg  IV q12h for AUC of 437 (goal AUC 400-550) F/Ucxs and clinical progress Monitor V/S, labs and levels as indicated  Height: 5\' 11"  (180.3 cm) Weight: 82.1 kg (181 lb) IBW/kg (Calculated) : 75.3  Temp (24hrs), Avg:100.5 F (38.1 C), Min:100.5 F (38.1 C), Max:100.5 F (38.1 C)  Recent Labs  Lab 05/25/21 1412  WBC 11.9*  CREATININE 0.87  LATICACIDVEN 2.0*    Estimated Creatinine Clearance: 90.2 mL/min (by C-G formula based on SCr of 0.87 mg/dL).    No Known Allergies  Antimicrobials this admission: Vancomycin 10/16 >>  cefepime 10/16 >>  Flagyl IV x 1 in ED  Microbiology results: 10/16 BCx: pending 10/16 UCx: pending   MRSA PCR:   Thank you for allowing pharmacy to be a part of this patient's care.  Isac Sarna, BS [place], [place] Clinical Pharmacist Pager [phone] 05/25/2021 2:55 PM

## 2021-05-25 NOTE — ED Notes (Signed)
Patient transported to CT 

## 2021-05-25 NOTE — ED Triage Notes (Signed)
Fever onset today, patient at the [facility] clinic for bile duct cancer

## 2021-05-25 NOTE — ED Provider Notes (Signed)
Care transferred to me.  CT and chest x-ray are overall unremarkable.  Will admit for sepsis and presumed bacteremia like last admission.   Sherwood Gambler, MD 05/25/21 [phone]

## 2021-05-25 NOTE — ED Notes (Signed)
Admitting at bedside 

## 2021-05-25 NOTE — ED Provider Notes (Signed)
[facility] EMERGENCY DEPARTMENT Provider Note   CSN: [id number] Arrival date & time: 05/25/21  1307     History Chief Complaint  Patient presents with   Fever    Jonathan Keith is a 65 y.o. male.  Patient with a history of cholangiocarcinoma with stent.  Originally followed by Dr. Dereck Leep.  But now followed by [facility].  Patient had an admission September 19 through September 22 for very similar presentation that was ongoing today.  It was a sepsis presentation.  Patient's blood cultures ended up growing Pseudomonas.  Everything inside his abdomen at that time from CT scans was stable.  But they did feel that it was probably a GI source.  Patient was feeling fine yesterday.  Patient with onset of fever and chills today.  Arrives here with a temp of 100.5 heart rate 127 blood pressure 112/64 respirations 18 oxygen saturations 95%.  Past medical history also significant for diabetes and hypertension and tobacco dependence.  Patient has follow-up again with the [facility] on October 26.      Past Medical History:  Diagnosis Date   Chronic pain    neck, back, knees   Class 1 obesity due to excess calories with body mass index (BMI) of 31.0 to 31.9 in adult    Claudication [facility])    DDD (degenerative disc disease), cervical    Diabetes mellitus ([place])    Dyslipidemia    Hypertension    Tobacco dependence     Patient Active Problem List   Diagnosis Date Noted   SIRS (systemic inflammatory response syndrome) ([place]) 04/28/2021   Acute febrile illness 03/12/2021   Acute cholecystitis 03/12/2021   Diverticulitis 03/12/2021   Transaminitis 03/12/2021   Leukocytosis 03/12/2021   Hyponatremia 03/12/2021   Hyperglycemia due to diabetes mellitus ([place]) 03/12/2021   GERD (gastroesophageal reflux disease) 03/12/2021   Hypoalbuminemia due to protein-calorie malnutrition ([place]) 03/12/2021   Cholangiocarcinoma ([place])    Elevated LFTs    Choledocholithiasis    Obstructive jaundice  02/24/2021   Class 1 obesity due to excess calories with body mass index (BMI) of 31.0 to 31.9 in adult 02/24/2021   Diabetes mellitus ([place]) 02/24/2021   Dyslipidemia 02/24/2021   Hypertension 02/24/2021   Tobacco dependence 02/24/2021    Past Surgical History:  Procedure Laterality Date   BILIARY BRUSHING N/A 02/25/2021   Procedure: BILIARY BRUSHING;  Surgeon: Rogene Houston, MD;  Location: [facility] ORS;  Service: Gastroenterology;  Laterality: N/A;   BILIARY STENT PLACEMENT N/A 02/25/2021   Procedure: BILIARY STENT PLACEMENT;  Surgeon: Rogene Houston, MD;  Location: [facility] ORS;  Service: Gastroenterology;  Laterality: N/A;   BILIARY STENT PLACEMENT  02/27/2021   Procedure: BILIARY STENT PLACEMENT (10FR x 9cm) IN THE RIGHT SYSTEM;  Surgeon: Rogene Houston, MD;  Location: [facility] ORS;  Service: Gastroenterology;;   BREAST SURGERY Left    benign lump- in his 45s.   ERCP N/A 02/25/2021   Procedure: ENDOSCOPIC RETROGRADE CHOLANGIOPANCREATOGRAPHY (ERCP);  Surgeon: Rogene Houston, MD;  Location: [facility] ORS;  Service: Gastroenterology;  Laterality: N/A;   ERCP N/A 02/27/2021   Procedure: ENDOSCOPIC RETROGRADE CHOLANGIOPANCREATOGRAPHY (ERCP);  Surgeon: Rogene Houston, MD;  Location: [facility] ORS;  Service: Gastroenterology;  Laterality: N/A;   KNEE SURGERY Left    x2   SPHINCTEROTOMY N/A 02/25/2021   Procedure: SPHINCTEROTOMY;  Surgeon: Rogene Houston, MD;  Location: [facility] ORS;  Service: Gastroenterology;  Laterality: N/A;   STENT REMOVAL  02/27/2021   Procedure: STENT REMOVAL (  8.5Fr x 9cm);  Surgeon: Rogene Houston, MD;  Location: [facility] ORS;  Service: Gastroenterology;;       Family History  Problem Relation Age of Onset   CAD Mother    Cancer Neg Hx     Social History   Tobacco Use   Smoking status: Every Day    Packs/day: 0.50    Years: 56.00    Pack years: 28.00    Types: Cigarettes   Smokeless tobacco: Never  Vaping Use   Vaping Use: Never used  Substance Use Topics   Alcohol use: Not  Currently    Comment: stopped 2.5 years ago 03/11/21   Drug use: Never    Home Medications Prior to Admission medications   Medication Sig Start Date End Date Taking? Authorizing Provider  ciprofloxacin (CIPRO) 750 MG tablet Take 1 tablet (750 mg total) by mouth 2 (two) times daily. 05/01/21 06/04/21  Amin, Jeanella Flattery, MD  doxycycline (ADOXA) 100 MG tablet Take 100 mg by mouth 2 (two) times daily. 04/23/21   [provider]  ferrous sulfate (FERROUSUL) 325 (65 FE) MG tablet Take 1 tablet (325 mg total) by mouth daily with breakfast. 03/18/21   Rehman, Mechele Dawley, MD  metFORMIN (GLUCOPHAGE) 500 MG tablet Take 1 tablet by mouth 2 (two) times daily with a meal. 03/10/21   [provider]  pantoprazole (PROTONIX) 40 MG tablet Take 1 tablet (40 mg total) by mouth daily. 02/28/21 02/28/22  Barton Dubois, MD  saccharomyces boulardii (FLORASTOR) 250 MG capsule Take 1 capsule (250 mg total) by mouth 2 (two) times daily. 05/01/21 06/30/21  Damita Lack, MD    Allergies    Patient has no known allergies.  Review of Systems   Review of Systems  Constitutional:  Positive for chills and fever.  HENT:  Negative for ear pain and sore throat.   Eyes:  Negative for pain and visual disturbance.  Respiratory:  Negative for cough and shortness of breath.   Cardiovascular:  Negative for chest pain and palpitations.  Gastrointestinal:  Negative for abdominal distention, abdominal pain, nausea and vomiting.  Genitourinary:  Negative for dysuria and hematuria.  Musculoskeletal:  Negative for arthralgias and back pain.  Skin:  Negative for color change and rash.  Neurological:  Negative for seizures and syncope.  All other systems reviewed and are negative.  Physical Exam Updated Vital Signs BP 112/64 (BP Location: Right Arm)   Pulse (!) 125   Temp (!) 100.5 F (38.1 C) (Oral)   Resp 18   Ht 1.803 m (5\' 11" )   Wt 82.1 kg   SpO2 95%   BMI 25.24 kg/m   Physical Exam Vitals and  nursing note reviewed.  Constitutional:      General: He is not in acute distress.    Appearance: Normal appearance. He is well-developed.  HENT:     Head: Normocephalic and atraumatic.  Eyes:     Extraocular Movements: Extraocular movements intact.     Conjunctiva/sclera: Conjunctivae normal.     Pupils: Pupils are equal, round, and reactive to light.  Cardiovascular:     Rate and Rhythm: Regular rhythm. Tachycardia present.     Heart sounds: No murmur heard. Pulmonary:     Effort: Pulmonary effort is normal. No respiratory distress.     Breath sounds: Normal breath sounds.  Abdominal:     General: There is no distension.     Palpations: Abdomen is soft.     Tenderness: There is no abdominal  tenderness. There is no guarding.  Musculoskeletal:        General: No swelling. Normal range of motion.     Cervical back: Neck supple.  Skin:    General: Skin is warm and dry.  Neurological:     General: No focal deficit present.     Mental Status: He is alert and oriented to person, place, and time.    ED Results / Procedures / Treatments   Labs (all labs ordered are listed, but only abnormal results are displayed) Labs Reviewed  LACTIC ACID, PLASMA - Abnormal; Notable for the following components:      Result Value   Lactic Acid, Venous 2.0 (*)    All other components within normal limits  COMPREHENSIVE METABOLIC PANEL - Abnormal; Notable for the following components:   Sodium 129 (*)    Glucose, Bld 201 (*)    Calcium 8.7 (*)    Albumin 3.0 (*)    AST 91 (*)    ALT 109 (*)    Alkaline Phosphatase 716 (*)    Total Bilirubin 4.6 (*)    All other components within normal limits  CBC WITH DIFFERENTIAL/PLATELET - Abnormal; Notable for the following components:   WBC 11.9 (*)    HCT 38.2 (*)    Neutro Abs 10.2 (*)    Lymphs Abs 0.6 (*)    All other components within normal limits  URINALYSIS, ROUTINE W REFLEX MICROSCOPIC - Abnormal; Notable for the following components:    Color, Urine AMBER (*)    APPearance HAZY (*)    Specific Gravity, Urine 1.033 (*)    Bilirubin Urine MODERATE (*)    Ketones, ur 5 (*)    Protein, ur 100 (*)    Non Squamous Epithelial 0-5 (*)    All other components within normal limits  RESP PANEL BY RT-PCR (FLU A&B, COVID) ARPGX2  CULTURE, BLOOD (ROUTINE X 2)  CULTURE, BLOOD (ROUTINE X 2)  URINE CULTURE  PROTIME-INR  APTT  LACTIC ACID, PLASMA    EKG None  Radiology No results found.  Procedures Procedures   Medications Ordered in ED Medications  lactated ringers infusion (has no administration in time range)  lactated ringers bolus 1,000 mL (1,000 mLs Intravenous New Bag/Given 05/25/21 1449)    And  lactated ringers bolus 1,000 mL (1,000 mLs Intravenous New Bag/Given 05/25/21 1515)    And  lactated ringers bolus 1,000 mL (has no administration in time range)  metroNIDAZOLE (FLAGYL) IVPB 500 mg (500 mg Intravenous New Bag/Given 05/25/21 1451)  vancomycin (VANCOREADY) IVPB 1500 mg/300 mL (1,500 mg Intravenous New Bag/Given 05/25/21 1518)  ceFEPIme (MAXIPIME) 2 g in sodium chloride 0.9 % 100 mL IVPB (has no administration in time range)  vancomycin (VANCOCIN) IVPB 1000 mg/200 mL premix (has no administration in time range)  ceFEPIme (MAXIPIME) 2 g in sodium chloride 0.9 % 100 mL IVPB (2 g Intravenous New Bag/Given 05/25/21 1448)  iohexol (OMNIPAQUE) 300 MG/ML solution 100 mL (100 mLs Intravenous Contrast Given 05/25/21 1501)    ED Course  I have reviewed the triage vital signs and the nursing notes.  Pertinent labs & imaging results that were available during my care of the patient were reviewed by me and considered in my medical decision making (see chart for details).    MDM Rules/Calculators/A&P                         CRITICAL CARE Performed by: Fredia Sorrow Total critical  care time: 45 minutes Critical care time was exclusive of separately billable procedures and treating other patients. Critical care  was necessary to treat or prevent imminent or life-threatening deterioration. Critical care was time spent personally by me on the following activities: development of treatment plan with patient and/or surrogate as well as nursing, discussions with consultants, evaluation of patient's response to treatment, examination of patient, obtaining history from patient or surrogate, ordering and performing treatments and interventions, ordering and review of laboratory studies, ordering and review of radiographic studies, pulse oximetry and re-evaluation of patient's condition.     Patient presenting with vital signs consistent with sepsis.  Fever tachycardic we will go and start the sepsis protocol.  Patient's only symptoms are fever and chills.  With a similar to his presentation back in September when he did grow Pseudomonas from his blood.  We will start sepsis protocol.  We will give 30 cc/kg fluids.  And will start on broad-spectrum antibiotics.  Will get liver function test labs.  And will also get CT of abdomen to evaluate.  Patient will require admission.  Patient's initial lactic acid slightly elevated at 2.  Liver function test with significant abnormalities.  Sodium is slightly low at 129.  Glucose 201 CO2 normal.  Renal function normal.  White blood cell count elevated 11,000.  Hemoglobin 13.  Urinalysis not consistent with urinary tract infection.  X-ray and CT abdomen pelvis pending.  Final Clinical Impression(s) / ED Diagnoses Final diagnoses:  Sepsis, due to unspecified organism, unspecified whether acute organ dysfunction present [facility])  Cholangiocarcinoma [facility])    Rx / DC Orders ED Discharge Orders     None        Fredia Sorrow, MD 05/25/21 1525

## 2021-05-26 ENCOUNTER — Telehealth: Payer: Self-pay | Admitting: Gastroenterology

## 2021-05-26 DIAGNOSIS — A419 Sepsis, unspecified organism: Secondary | ICD-10-CM

## 2021-05-26 DIAGNOSIS — R7989 Other specified abnormal findings of blood chemistry: Secondary | ICD-10-CM | POA: Diagnosis not present

## 2021-05-26 DIAGNOSIS — C221 Intrahepatic bile duct carcinoma: Secondary | ICD-10-CM

## 2021-05-26 DIAGNOSIS — E119 Type 2 diabetes mellitus without complications: Secondary | ICD-10-CM | POA: Diagnosis not present

## 2021-05-26 LAB — URINE CULTURE: Culture: NO GROWTH

## 2021-05-26 LAB — BLOOD CULTURE ID PANEL (REFLEXED) - BCID2

## 2021-05-26 LAB — COMPREHENSIVE METABOLIC PANEL
ALT: 74 U/L — ABNORMAL HIGH (ref 0–44)
AST: 55 U/L — ABNORMAL HIGH (ref 15–41)
Albumin: 2.4 g/dL — ABNORMAL LOW (ref 3.5–5.0)
Alkaline Phosphatase: 525 U/L — ABNORMAL HIGH (ref 38–126)
Anion gap: 4 — ABNORMAL LOW (ref 5–15)
BUN: 9 mg/dL (ref 8–23)
CO2: 27 mmol/L (ref 22–32)
Calcium: 8.4 mg/dL — ABNORMAL LOW (ref 8.9–10.3)
Chloride: 102 mmol/L (ref 98–111)
Creatinine, Ser: 0.65 mg/dL (ref 0.61–1.24)
GFR, Estimated: >60 mL/min (ref >60)
Glucose, Bld: 154 mg/dL — ABNORMAL HIGH (ref 70–99)
Potassium: 3.7 mmol/L (ref 3.5–5.1)
Sodium: 133 mmol/L — ABNORMAL LOW (ref 135–145)
Total Bilirubin: 6.2 mg/dL — ABNORMAL HIGH (ref 0.3–1.2)
Total Protein: 6.3 g/dL — ABNORMAL LOW (ref 6.5–8.1)

## 2021-05-26 LAB — GLUCOSE, CAPILLARY
Glucose-Capillary: 86 mg/dL (ref 70–99)
Glucose-Capillary: 141 mg/dL — ABNORMAL HIGH (ref 70–99)
Glucose-Capillary: 127 mg/dL — ABNORMAL HIGH (ref 70–99)
Glucose-Capillary: 111 mg/dL — ABNORMAL HIGH (ref 70–99)

## 2021-05-26 LAB — CBC
HCT: 33.9 % — ABNORMAL LOW (ref 39.0–52.0)
Hemoglobin: 11.2 g/dL — ABNORMAL LOW (ref 13.0–17.0)
MCH: 30.2 pg (ref 26.0–34.0)
MCHC: 33 g/dL (ref 30.0–36.0)
MCV: 91.4 fL (ref 80.0–100.0)
Platelets: 180 10*3/uL (ref 150–400)
RBC: 3.71 MIL/uL — ABNORMAL LOW (ref 4.22–5.81)
RDW: 15.1 % (ref 11.5–15.5)
WBC: 10 10*3/uL (ref 4.0–10.5)
nRBC: 0 % (ref 0.0–0.2)

## 2021-05-26 MED ORDER — PIPERACILLIN-TAZOBACTAM 3.375 G IVPB
3.3750 g | Freq: Three times a day (TID) | INTRAVENOUS | Status: DC
  Administered 2021-05-26 – 2021-05-29 (×7): 3.375 g via INTRAVENOUS
  Filled 2021-05-26 (×7): qty 50

## 2021-05-26 MED ORDER — ENSURE ENLIVE PO LIQD
237.0000 mL | Freq: Two times a day (BID) | ORAL | Status: DC
  Administered 2021-05-26: 237 mL via ORAL

## 2021-05-26 MED ORDER — SODIUM CHLORIDE 0.9 % IV SOLN
2.0000 g | INTRAVENOUS | Status: DC
  Filled 2021-05-26 (×6): qty 2000

## 2021-05-26 NOTE — Progress Notes (Signed)
Spoke with the [facility]. Surgery has been postponed due to active infection. Tentative date is 11/23. Patient and family are aware per [facility], who has already spoken with them.  Spoke with Dr. Laural Golden. ERCP not feasible here due to stent length needed. Recommend referral to [facility], Dr. Lysle Rubens.   I am sending a referral request that can be started as outpatient to have this completed.   Annitta Needs, PhD, ANP-[facility]

## 2021-05-26 NOTE — Progress Notes (Signed)
  Progress Note    Jonathan Keith   XNA:[id number]  DOB: 1956/01/17  DOA: 05/25/2021     1 Date of Service: 05/26/2021     Assessment and Plan Hyponatremia -improved with IVF -trend  Acute febrile illness meeting SIRS criteria again without obvious focus of infection identified at this time.   Temp to 103 at home- no temperature in hospital, no sick contacts Recent hospitalization with similar presentation, diagnosed with Pseudomonas bacteremia, discharged on ciprofloxacin, he tells me he took this 2 daily for 10 days -Meeting SIRS criteria with fever to 100.5, tachycardic 102 225, leukocytosis 11.9.  Lactic acid 2 > 1.8.  -  Chest x-ray clear, UA not suggestive, CT abdomen and pelvis with contrast-no new acute process identified, stable biliary stents with scattered dilated intrahepatic bile ducts.. -Continue broad-spectrum antibiotics with Vanco, cefepime and metronidazole - 3L RL bolus given, continue N/s 100cc/hr x 15hrs -CBC, CMP in the morning -GI consult in the morning -Follow up Blood and urine cultures  Cholangiocarcinoma (HCC) S/p stents, and now portal vein embolization to help with left hepatic lobe hypertrophy with plans for extended right hepatectomy proposed date, October 26 at [facility] in [place].  Patient was supposed to travel 10/20 to [place]. -GI consult in the morning -Pending GI eval, patient may need to cancel flight plans  Elevated LFTs AST 91, ALT 109, ALP 7116.  Total bilirubin 4.6. - GI consult   Diabetes mellitus (HCC)  A1c 8.7. - SSI- s -Hold Home metformin    Subjective:  No current fever and never had pain  Objective Vitals:   05/25/21 1757 05/25/21 2115 05/26/21 0110 05/26/21 0507  BP: 113/72 (!) 156/78 (!) 147/82 (!) 151/84  Pulse: (!) 107 (!) 106 95 88  Resp: _0 Temp: 99.9 F (37.7 C)  98.6 F (37 C) 98.7 F (37.1 C)  TempSrc: Oral  Oral Oral  SpO2: 93% 95% 94% 95%  Weight:      Height:       82.1  kg   Exam  General: Appearance:     Well developed, well nourished male in no acute distress     Lungs:     respirations unlabored  Heart:    Normal heart rate.   MS:   All extremities are intact.   Neurologic:   Awake, alert, oriented x 3. No apparent focal neurological           defect.      Labs / Other Information Na trending up Liver enzymes elevated esp alk phos     Eulogio Bear DO [facility] 05/26/2021, 8:22 AM

## 2021-05-26 NOTE — Progress Notes (Signed)
Pt arrived to room #202 via [place] from ED @ 0840. Wife at bedside. Pt and wife oriented to room and safety procedures, both state understanding. Pt ambulatory without assist to BR to void. Only c/o is hungry, advised current diet order is NPO. Pt upset, states, "I've been able to eat and drink since I got here." Current blood sugar is 86mg /dl. MD Noemi Chapel for clarification of diet order. IVF infusing without s/s infiltration. Tele applied, NSR. No current c/o pain voiced.

## 2021-05-26 NOTE — Telephone Encounter (Signed)
Jonathan Keith:  Patient of Dr. Olevia Perches here locally. Patient needs referral to Dr. Lysle Rubens with [facility] for ERCP. History of cholangiocarcinoma with stents placed in July and Aug 2022. Now with elevated LFTs, admitted with fever and bacteremia. Currently being treated with IV antibiotics. As long as remains stable, we could potentially pursue this as outpatient. If he becomes unstable, will have to regroup.  Thanks!

## 2021-05-26 NOTE — Assessment & Plan Note (Signed)
-  improved with IVF -trend

## 2021-05-26 NOTE — Consult Note (Signed)
PHARMACY - PHYSICIAN COMMUNICATION CRITICAL VALUE ALERT - BLOOD CULTURE IDENTIFICATION (BCID)  Jonathan Keith is an 65 y.o. male who presented to [facility] on 05/25/2021 with a chief complaint of  fevers that started today.  Assessment:  cholangiocarcinoma and stent placements  ABX's of choice for entercoccus faecalis w/o resistance=Ampicillin 2mg  q4hrs  Name of physician (or Provider) Contacted: Eulogio Bear  Current antibiotics: Cefepime 2gm q8hrs  Vancomycin 1000mg  q12hrs  Metronidazole 500mg  q12hrs  Changes to prescribed antibiotics recommended:  Recommendations accepted by provider  Changed to Ampicillin 2gms q4hrs  Results for orders placed or performed during the hospital encounter of 05/25/21  Blood Culture ID Panel (Reflexed) (Collected: 05/25/2021  2:39 PM)  Result Value Ref Range   Enterococcus faecalis DETECTED (A) NOT DETECTED   Enterococcus Faecium NOT DETECTED NOT DETECTED   Listeria monocytogenes NOT DETECTED NOT DETECTED   Staphylococcus species NOT DETECTED NOT DETECTED   Staphylococcus aureus (BCID) NOT DETECTED NOT DETECTED   Staphylococcus epidermidis NOT DETECTED NOT DETECTED   Staphylococcus lugdunensis NOT DETECTED NOT DETECTED   Streptococcus species NOT DETECTED NOT DETECTED   Streptococcus agalactiae NOT DETECTED NOT DETECTED   Streptococcus pneumoniae NOT DETECTED NOT DETECTED   Streptococcus pyogenes NOT DETECTED NOT DETECTED   A.calcoaceticus-baumannii NOT DETECTED NOT DETECTED   Bacteroides fragilis NOT DETECTED NOT DETECTED   Enterobacterales NOT DETECTED NOT DETECTED   Enterobacter cloacae complex NOT DETECTED NOT DETECTED   Escherichia coli NOT DETECTED NOT DETECTED   Klebsiella aerogenes NOT DETECTED NOT DETECTED   Klebsiella oxytoca NOT DETECTED NOT DETECTED   Klebsiella pneumoniae NOT DETECTED NOT DETECTED   Proteus species NOT DETECTED NOT DETECTED   Salmonella species NOT DETECTED NOT DETECTED   Serratia marcescens NOT DETECTED  NOT DETECTED   Haemophilus influenzae NOT DETECTED NOT DETECTED   Neisseria meningitidis NOT DETECTED NOT DETECTED   Pseudomonas aeruginosa NOT DETECTED NOT DETECTED   Stenotrophomonas maltophilia NOT DETECTED NOT DETECTED   Candida albicans NOT DETECTED NOT DETECTED   Candida auris NOT DETECTED NOT DETECTED   Candida glabrata NOT DETECTED NOT DETECTED   Candida krusei NOT DETECTED NOT DETECTED   Candida parapsilosis NOT DETECTED NOT DETECTED   Candida tropicalis NOT DETECTED NOT DETECTED   Cryptococcus neoformans/gattii NOT DETECTED NOT DETECTED   Vancomycin resistance NOT DETECTED NOT DETECTED    Berta Minor 05/26/2021  2:46 PM

## 2021-05-26 NOTE — Consult Note (Signed)
[facility] for Infectious Disease   Date of Admission:  05/25/2021     Reason for visit: Auto consult Enterococcus bacteremia  Abx this admit: Cefepime 10/16-present Metronidazole 10/16-present Vancomycin 10/16-present  HPI: 65 year old gentleman with a history of cholangiocarcinoma diagnosed in July 2022.  He is status post ERCP with stent placement in July as well as an additional ERCP with biliary stenting to the left and right system on 03/21/2021.  Furthermore, he underwent right portal vein embolization in September 2022 to allow for left liver lobe hypertrophy in anticipation of planned right hepatectomy at the [facility] in [place].  He was admitted in late September with sepsis likely secondary to recent portal vein embolization and blood cultures that were positive for Pseudomonas aeruginosa.  He did not have endoscopic intervention at that time.  He was discharged home with ciprofloxacin which he was supposed to continue taking until his follow-up with the [facility] scheduled for later this month on 06/04/2021.  Per the HPI, he was compliant with this medication for approximately 10 days after discharge but apparently subsequently stopped taking after that.  In the emergency department on admission, he was febrile and tachycardic.  His WBC was 11.9 and liver enzymes were elevated with AST 91, ALT 109, alk phos 716, and T bili 4.6.  He underwent abdominal CT with contrast that did not show any new acute processes.  It showed stable by lobar biliary stents with scattered dilated intrahepatic bile ducts.  He was started on broad-spectrum antibiotics as noted above.  Blood cultures were obtained which are positive in 2 out of 4 bottles for Enterococcus faecalis.  Assessment:  Enterococcus faecalis bacteremia (no resistance detected) Recent PsA bacteremia.  Recommended by [facility] to be maintained on Ciprofloxacin up until his surgery scheduled 06/04/21 Cholangiocarcinoma s/p  biliary stenting and portal vein embolization Cholangitis due to biliary obstruction   Recommendations: --Tailor antibiotics.  Due to biliary source of infection and prior PsA bacteremia will include GNR/anaerobic coverage with Zosyn.  Can stop Vanc, Cefepime, Flagyl. --TTE --Repeat blood cultures in the morning --GI consult pending --Will follow   Raynelle Highland for Infectious [place] [phone] pager 05/26/2021, 3:37 PM

## 2021-05-26 NOTE — Consult Note (Addendum)
Referring Provider: Dr. Denton Brick Primary Care Physician:  [name], PA-C Primary Gastroenterologist:  Dr. Laural Golden  Hepatologist at [facility]: Dr. Whitman Hero Hepatobiliary and Pancreatic Surgeon: Dr. Robby Sermon [phone])  Date of Admission: 05/25/21 Date of Consultation: 05/26/21  Reason for Consultation:  history of cholangiocarcinoma, s/p stent placement in July and Aug 2022. Elevated LFTs.   HPI:  Jonathan Keith is a 65 y.o. year old male with history of cholangiocarcinoma diagnosed in July 2022. He completed an ERCP in July 2022 with stent placement and had additional ERCP with biliary stenting to left and right system 03/21/21. He underwent right portal vein embolization in September 2022 to allow for left liver lobe hypertrophy prior to planned right hepatectomy by Dr. Ronelle Nigh [facility], [place], [place]). He was inpatient late September with SIRS and felt acute infection likely related to recent portal vein embolization. Blood cultures at that time with Pseudomonas aeruginosa. No endoscopic intervention necessary. He presented this admission with fever and elevated LFTs that had previously normalized (with exception of alk phos previously). Admitting Tbili 4.6, Alk Phos 716, AST 91, ALT 109. This morning, Tbili has increased to 6.2, alk phos slightly declined, and transaminases downward trending. CT yesterday with biliary stents appearing in position. Scattered dilated intrahepatic bile ducts seen. WBC count 11.9 on admission, now 10.0. Afebrile today. Empiric antibiotics have been started.  Patient was scheduled to leave this Thursday for [place]. He has a virtual visit tomorrow planned with Dr. Melody Comas at 11 am their time. He states at home, his fever was max 103. Denies any pain, N/V. States he has a good appetite. Surgery is planned for 10/26. Both he and his wife are very concerned regarding timing of flights and plans for this admission.   Blood cultures in  process this admission. Gram positive cocci thus far.   Past Medical History:  Diagnosis Date   Chronic pain    neck, back, knees   Class 1 obesity due to excess calories with body mass index (BMI) of 31.0 to 31.9 in adult    Claudication [facility])    DDD (degenerative disc disease), cervical    Diabetes mellitus ([place])    Dyslipidemia    Hypertension    Tobacco dependence     Past Surgical History:  Procedure Laterality Date   BILIARY BRUSHING N/A 02/25/2021   Procedure: BILIARY BRUSHING;  Surgeon: Rogene Houston, MD;  Location: [facility];  Service: Gastroenterology;  Laterality: N/A;   BILIARY STENT PLACEMENT N/A 02/25/2021   Procedure: BILIARY STENT PLACEMENT;  Surgeon: Rogene Houston, MD;  Location: [facility];  Service: Gastroenterology;  Laterality: N/A;   BILIARY STENT PLACEMENT  02/27/2021   Procedure: BILIARY STENT PLACEMENT (10FR x 9cm) IN THE RIGHT SYSTEM;  Surgeon: Rogene Houston, MD;  Location: [facility];  Service: Gastroenterology;;   BREAST SURGERY Left    benign lump- in his 60s.   ERCP N/A 02/25/2021   Procedure: ENDOSCOPIC RETROGRADE CHOLANGIOPANCREATOGRAPHY (ERCP);  Surgeon: Rogene Houston, MD;  Location: [facility];  Service: Gastroenterology;  Laterality: N/A;   ERCP N/A 02/27/2021   Procedure: ENDOSCOPIC RETROGRADE CHOLANGIOPANCREATOGRAPHY (ERCP);  Surgeon: Rogene Houston, MD;  Location: [facility];  Service: Gastroenterology;  Laterality: N/A;   KNEE SURGERY Left    x2   SPHINCTEROTOMY N/A 02/25/2021   Procedure: SPHINCTEROTOMY;  Surgeon: Rogene Houston, MD;  Location: [facility];  Service: Gastroenterology;  Laterality: N/A;   STENT REMOVAL  02/27/2021   Procedure: Lavell Islam  REMOVAL (8.5Fr x 9cm);  Surgeon: Rogene Houston, MD;  Location: [facility];  Service: Gastroenterology;;    Prior to Admission medications   Medication Sig Start Date End Date Taking? Authorizing Provider  ciprofloxacin (CIPRO) 750 MG tablet Take 1 tablet (750 mg total) by mouth 2 (two) times daily. 05/01/21  06/04/21 Yes Amin, Jeanella Flattery, MD  metFORMIN (GLUCOPHAGE) 500 MG tablet Take 1 tablet by mouth daily with breakfast. 03/10/21  Yes [provider]  pantoprazole (PROTONIX) 40 MG tablet Take 1 tablet (40 mg total) by mouth daily. 02/28/21 02/28/22 Yes Barton Dubois, MD  doxycycline (ADOXA) 100 MG tablet Take 100 mg by mouth 2 (two) times daily. Patient not taking: Reported on 05/25/2021 04/23/21   [provider]  ferrous sulfate (FERROUSUL) 325 (65 FE) MG tablet Take 1 tablet (325 mg total) by mouth daily with breakfast. Patient not taking: Reported on 05/25/2021 03/18/21   Rogene Houston, MD  saccharomyces boulardii (FLORASTOR) 250 MG capsule Take 1 capsule (250 mg total) by mouth 2 (two) times daily. Patient not taking: No sig reported 05/01/21 06/30/21  Damita Lack, MD    Current Facility-Administered Medications  Medication Dose Route Frequency Provider Last Rate Last Admin   acetaminophen (TYLENOL) tablet 650 mg  650 mg Oral Q6H PRN Emokpae, Ejiroghene E, MD       Or   acetaminophen (TYLENOL) suppository 650 mg  650 mg Rectal Q6H PRN Emokpae, Ejiroghene E, MD       ceFEPIme (MAXIPIME) 2 g in sodium chloride 0.9 % 100 mL IVPB  2 g Intravenous Q8H Emokpae, Ejiroghene E, MD   Stopped at 05/26/21 0711   feeding supplement (ENSURE ENLIVE / ENSURE PLUS) liquid 237 mL  237 mL Oral BID BM Vann, Jessica U, DO   237 mL at 05/26/21 1102   insulin aspart (novoLOG) injection 0-5 Units  0-5 Units Subcutaneous QHS Emokpae, Ejiroghene E, MD       insulin aspart (novoLOG) injection 0-9 Units  0-9 Units Subcutaneous TID WC Emokpae, Ejiroghene E, MD       metroNIDAZOLE (FLAGYL) IVPB 500 mg  500 mg Intravenous Q12H Emokpae, Ejiroghene E, MD   Stopped at 05/26/21 0608   ondansetron (ZOFRAN) tablet 4 mg  4 mg Oral Q6H PRN Emokpae, Ejiroghene E, MD       Or   ondansetron (ZOFRAN) injection 4 mg  4 mg Intravenous Q6H PRN Emokpae, Ejiroghene E, MD       pantoprazole (PROTONIX) EC tablet 40  mg  40 mg Oral Daily Emokpae, Ejiroghene E, MD   40 mg at 05/26/21 1101   polyethylene glycol (MIRALAX / GLYCOLAX) packet 17 g  17 g Oral Daily PRN Emokpae, Ejiroghene E, MD       vancomycin (VANCOCIN) IVPB 1000 mg/200 mL premix  1,000 mg Intravenous Q12H Emokpae, Ejiroghene E, MD   Stopped at 05/26/21 0608    Allergies as of 05/25/2021   (No Known Allergies)    Family History  Problem Relation Age of Onset   CAD Mother    Cancer Neg Hx     Social History   Socioeconomic History   Marital status: Married    Spouse name: Not on file   Number of children: Not on file   Years of education: Not on file   Highest education level: Not on file  Occupational History   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  Tobacco Use   Smoking status: Every Day    Packs/day: 0.50  Years: 56.00    Pack years: 28.00    Types: Cigarettes   Smokeless tobacco: Never  Vaping Use   Vaping Use: Never used  Substance and Sexual Activity   Alcohol use: Not Currently    Comment: stopped 2.5 years ago 03/11/21   Drug use: Never   Sexual activity: Not on file  Other Topics Concern   Not on file  Social History Narrative   Not on file   Social Determinants of Health   Financial Resource Strain: Medium Risk   Difficulty of Paying Living Expenses: Somewhat hard  Food Insecurity: No Food Insecurity   Worried About Charity fundraiser in the Last Year: Never true   Ran Out of Food in the Last Year: Never true  Transportation Needs: No Transportation Needs   Lack of Transportation (Medical): No   Lack of Transportation (Non-Medical): No  Physical Activity: Insufficiently Active   Days of Exercise per Week: 2 days   Minutes of Exercise per Session: 20 min  Stress: No Stress Concern Present   Feeling of Stress : Only a little  Social Connections: Moderately Integrated   Frequency of Communication with Friends and Family: More than three times a week   Frequency of Social Gatherings with Friends and  Family: More than three times a week   Attends Religious Services: More than 4 times per year   Active Member of [employer] or Organizations: No   Attends Music therapist: Never   Marital Status: Married  Human resources officer Violence: Not At Risk   Fear of Current or Ex-Partner: No   Emotionally Abused: No   Physically Abused: No   Sexually Abused: No    Review of Systems: Gen: see HPI CV: Denies chest pain, heart palpitations, syncope, edema  Resp: Denies shortness of breath with rest, cough, wheezing GI:see HPI GU : Denies urinary burning, urinary frequency, urinary incontinence.  MS: Denies joint pain,swelling, cramping Derm: Denies rash, itching, dry skin Psych: Denies depression, anxiety,confusion, or memory loss Heme: Denies bruising, bleeding, and enlarged lymph nodes.  Physical Exam: Vital signs in last 24 hours: Temp:  [98.5 F (36.9 C)-100.5 F (38.1 C)] 98.5 F (36.9 C) (10/17 0832) Pulse Rate:  [78-125] 87 (10/17 0838) Resp:  [14-20] 18 (10/17 0838) BP: (112-157)/(61-84) 157/61 (10/17 0838) SpO2:  [93 %-97 %] 97 % (10/17 0838) Weight:  [79.7 kg-82.1 kg] 79.7 kg (10/17 0838) Last BM Date: 05/25/21 General:   Alert,  Well-developed, well-nourished, pleasant and cooperative,jaundiced Head:  Normocephalic and atraumatic. Eyes:  +scleral icterus Ears:  Normal auditory acuity. Lungs:  Clear throughout to auscultation.    Heart:  S1 S2 present without murmurs Abdomen:  Soft, nontender and nondistended. No masses, hepatosplenomegaly or hernias noted. Normal bowel sounds, without guarding, and without rebound.   Rectal:  Deferred  Msk:  Symmetrical without gross deformities. Normal posture. Extremities:  Without edema. Neurologic:  Alert and  oriented x4 Psych:  Alert and cooperative. Normal mood and affect.  Intake/Output from previous day: 10/16 0701 - 10/17 0700 In: 974.3 [I.V.:592.4; IV Piggyback:381.9] Out: -  Intake/Output this shift: Total I/O In:  820 [P.O.:720; IV Piggyback:100] Out: -   Lab Results: Recent Labs    05/25/21 1412 05/26/21 0444  WBC 11.9* 10.0  HGB 13.0 11.2*  HCT 38.2* 33.9*  PLT 259 180   BMET Recent Labs    05/25/21 1412 05/26/21 0444  NA 129* 133*  K 4.1 3.7  CL 99 102  CO2 22 27  GLUCOSE 201* 154*  BUN 12 9  CREATININE 0.87 0.65  CALCIUM 8.7* 8.4*   LFT Recent Labs    05/25/21 1412 05/26/21 0444  PROT 7.4 6.3*  ALBUMIN 3.0* 2.4*  AST 91* 55*  ALT 109* 74*  ALKPHOS 716* 525*  BILITOT 4.6* 6.2*   PT/INR Recent Labs    05/25/21 1412  LABPROT 12.8  INR 1.0     Studies/Results: CT Abdomen Pelvis W Contrast  Result Date: 05/25/2021 CLINICAL DATA:  Abdominal abscess/infection suspected EXAM: CT ABDOMEN AND PELVIS WITH CONTRAST TECHNIQUE: Multidetector CT imaging of the abdomen and pelvis was performed using the standard protocol following bolus administration of intravenous contrast. CONTRAST:  131m OMNIPAQUE IOHEXOL 300 MG/ML  SOLN COMPARISON:  CT April 28, 2021 FINDINGS: Lower chest: No acute abnormality. Hepatobiliary: Streak artifact from multifocal hypertense metallic material in the right hepatic lobe obscures evaluation. Bilobar biliary stents appear appropriate in positioning. Scattered dilated intrahepatic bile ducts are again seen. Stable distension of the gallbladder without pericholecystic fluid or adjacent inflammatory change. Pancreas: No pancreatic ductal dilation or evidence of acute inflammation. Spleen: Within normal limits. Adrenals/Urinary Tract: Adrenal glands are unremarkable. Kidneys are normal, without renal calculi, solid enhancing lesion, or hydronephrosis. Bladder is unremarkable for degree of distension. Stomach/Bowel: Stomach is predominantly decompressed. No pathologic dilation of small or large bowel. The appendix and terminal ileum appear normal. Colonic diverticulosis without findings of acute diverticulitis. Vascular/Lymphatic: Aortic and branch vessel  atherosclerosis without abdominal aortic aneurysm. No pathologically enlarged abdominal or pelvic lymph nodes. Reproductive: Prostate is unremarkable. Other: Small fat containing paraumbilical hernia. No abdominopelvic ascites. No walled off fluid collections. No pneumoperitoneum. Musculoskeletal: Multilevel degenerative changes spine. No acute osseous abnormality. IMPRESSION: 1. No new acute process in the abdomen or pelvis identified. 2. Streak artifact from hyperdense metallic material in the right hepatic lobes obscures evaluation. Within this context there is no new intrahepatic focal process identified. Colonic diverticulosis without findings of acute diverticulitis. 3. Stable bilobar biliary stents with scattered dilated intrahepatic bile ducts. 4. Aortic Atherosclerosis (ICD10-I70.0). Electronically Signed   By: JDahlia BailiffM.D.   On: 05/25/2021 15:23   DG Chest Port 1 View  Result Date: 05/25/2021 CLINICAL DATA:  Questionable sepsis EXAM: PORTABLE CHEST 1 VIEW COMPARISON:  04/28/2021 FINDINGS: The heart size and mediastinal contours are within normal limits. Both lungs are clear. The visualized skeletal structures are unremarkable. IMPRESSION: No acute abnormality of the lungs in AP portable projection. Electronically Signed   By: ADelanna AhmadiM.D.   On: 05/25/2021 15:24    Impression:  65y.o. year old male with history of cholangiocarcinoma diagnosed in July 2022. He completed an ERCP in July 2022 with stent placement and had additional ERCP with biliary stenting to left and right system 03/21/21. He underwent right portal vein embolization in September 2022 to allow for left liver lobe hypertrophy prior to planned [place] [facility] [facility] [place] [place] [place] Inpatient late September with SIRS and felt acute infection likely related to recent portal vein embolization. Blood cultures at that time with Pseudomonas aeruginosa. No endoscopic intervention necessary.   Now  presenting with recurrent fever and elevated LFTs concerning for obstruction; notably these had previously normalized (with exception of alk phos). Admitting Tbili 4.6, Alk Phos 716, AST 91, ALT 109. This morning, Tbili has increased to 6.2, alk phos slightly declined, and transaminases downward trending. CT yesterday with biliary stents appearing in position. Scattered dilated intrahepatic bile ducts seen. WBC count 11.9 on admission, now  10.0. Afebrile today. Empiric antibiotics have been started.  He is without abdominal pain, N/V. I have reached out to Dr. Laural Golden regarding role of ERCP. Once I have spoken with him, will also need to reach out to Dr. Ronelle Nigh at the [facility] in [place]. Patient and wife had planned on flying on Thursday to [place], with right hepatectomy planned for 10/26.   Blood cultures with gram positive cocci thus far. Continue with antibiotic therapy.   Plan: May have soft diet NPO after midnight Discussing with Dr. Laural Golden Will also be reaching out to the [facility] Further recommendations to follow   Annitta Needs, PhD, ANP-BC [facility] Gastroenterology     LOS: 1 day    05/26/2021, 12:29 PM   ADDENDUM: spoke with Dr. Laural Golden. We do not have appropriate length stents to perform what is needed here via ERCP. If ERCP needed, would recommend at [facility] with Dr. Lysle Rubens. I have placed a call to the [facility] to update on patient's status. Once adequately treated with IV antibiotics, could send home on continued oral antibiotics if stable and follow-up either at [facility] or [facility] depending on best course of action per surgical team. Awaiting call back.  Annitta Needs, PhD, ANP-BC [facility] Gastroenterology

## 2021-05-26 NOTE — Assessment & Plan Note (Signed)
A1c 8.7. - SSI- s -HoldHome metformin

## 2021-05-26 NOTE — Assessment & Plan Note (Signed)
AST 91, ALT 109, ALP 7116.  Total bilirubin 4.6. - GI consult

## 2021-05-26 NOTE — Assessment & Plan Note (Signed)
meeting SIRS criteria again without obvious focus of infection identified at this time.   Temp to 103 at home- no temperature in hospital, no sick contacts Recent hospitalization with similar presentation, diagnosed with Pseudomonas bacteremia, discharged on ciprofloxacin, he tells me he took this 2 daily for 10 days -Meeting SIRS criteria with fever to 100.5, tachycardic 102 225, leukocytosis 11.9.  Lactic acid 2 > 1.8.  - Chest x-ray clear, UA not suggestive, CT abdomen and pelvis with contrast-no new acute process identified, stable biliary stents with scattered dilated intrahepatic bile ducts.. -Continue broad-spectrum antibiotics with Vanco,cefepime and metronidazole - 3LRLbolus given, continueN/s 100cc/hr x 15hrs -CBC, CMP in the morning -GI consult in the morning -Follow up Blood and urine cultures

## 2021-05-26 NOTE — Assessment & Plan Note (Signed)
S/pstents,and now portal vein embolization to help with left hepatic lobe hypertrophy with plans for extended right hepatectomy proposeddate,October 26 at [facility] in [place].  Patient was supposed to travel 10/20 to [place]. -GI consult in the morning -Pending GI eval, patient may need to cancel flight plans

## 2021-05-27 ENCOUNTER — Inpatient Hospital Stay (HOSPITAL_COMMUNITY): Payer: Medicare Other

## 2021-05-27 DIAGNOSIS — R7881 Bacteremia: Secondary | ICD-10-CM

## 2021-05-27 DIAGNOSIS — B952 Enterococcus as the cause of diseases classified elsewhere: Secondary | ICD-10-CM

## 2021-05-27 DIAGNOSIS — A419 Sepsis, unspecified organism: Secondary | ICD-10-CM | POA: Diagnosis not present

## 2021-05-27 DIAGNOSIS — C221 Intrahepatic bile duct carcinoma: Secondary | ICD-10-CM | POA: Diagnosis not present

## 2021-05-27 DIAGNOSIS — R7989 Other specified abnormal findings of blood chemistry: Secondary | ICD-10-CM | POA: Diagnosis not present

## 2021-05-27 LAB — CBC WITH DIFFERENTIAL/PLATELET
Abs Immature Granulocytes: 0.02 10*3/uL (ref 0.00–0.07)
Basophils Absolute: 0 10*3/uL (ref 0.0–0.1)
Basophils Relative: 0 %
Eosinophils Absolute: 0.6 10*3/uL — ABNORMAL HIGH (ref 0.0–0.5)
Eosinophils Relative: 9 %
HCT: 34.1 % — ABNORMAL LOW (ref 39.0–52.0)
Hemoglobin: 11.3 g/dL — ABNORMAL LOW (ref 13.0–17.0)
Immature Granulocytes: 0 %
Lymphocytes Relative: 14 %
Lymphs Abs: 1 10*3/uL (ref 0.7–4.0)
MCH: 30.3 pg (ref 26.0–34.0)
MCHC: 33.1 g/dL (ref 30.0–36.0)
MCV: 91.4 fL (ref 80.0–100.0)
Monocytes Absolute: 0.8 10*3/uL (ref 0.1–1.0)
Monocytes Relative: 11 %
Neutro Abs: 4.7 10*3/uL (ref 1.7–7.7)
Neutrophils Relative %: 66 %
Platelets: 203 10*3/uL (ref 150–400)
RBC: 3.73 MIL/uL — ABNORMAL LOW (ref 4.22–5.81)
RDW: 15.3 % (ref 11.5–15.5)
WBC: 7.2 10*3/uL (ref 4.0–10.5)
nRBC: 0 % (ref 0.0–0.2)

## 2021-05-27 LAB — COMPREHENSIVE METABOLIC PANEL
ALT: 68 U/L — ABNORMAL HIGH (ref 0–44)
AST: 65 U/L — ABNORMAL HIGH (ref 15–41)
Albumin: 2.4 g/dL — ABNORMAL LOW (ref 3.5–5.0)
Alkaline Phosphatase: 476 U/L — ABNORMAL HIGH (ref 38–126)
Anion gap: 6 (ref 5–15)
BUN: 11 mg/dL (ref 8–23)
CO2: 24 mmol/L (ref 22–32)
Calcium: 8.4 mg/dL — ABNORMAL LOW (ref 8.9–10.3)
Chloride: 103 mmol/L (ref 98–111)
Creatinine, Ser: 0.68 mg/dL (ref 0.61–1.24)
GFR, Estimated: >60 mL/min (ref >60)
Glucose, Bld: 103 mg/dL — ABNORMAL HIGH (ref 70–99)
Potassium: 3.7 mmol/L (ref 3.5–5.1)
Sodium: 133 mmol/L — ABNORMAL LOW (ref 135–145)
Total Bilirubin: 6.4 mg/dL — ABNORMAL HIGH (ref 0.3–1.2)
Total Protein: 6.4 g/dL — ABNORMAL LOW (ref 6.5–8.1)

## 2021-05-27 LAB — ECHOCARDIOGRAM COMPLETE
Area-P 1/2: 1.65 cm2
Height: 71 in
S' Lateral: 2.7 cm
Weight: 2811.31 oz

## 2021-05-27 LAB — GLUCOSE, CAPILLARY
Glucose-Capillary: 106 mg/dL — ABNORMAL HIGH (ref 70–99)
Glucose-Capillary: 91 mg/dL (ref 70–99)
Glucose-Capillary: 137 mg/dL — ABNORMAL HIGH (ref 70–99)
Glucose-Capillary: 153 mg/dL — ABNORMAL HIGH (ref 70–99)

## 2021-05-27 MED ORDER — BOOST / RESOURCE BREEZE PO LIQD CUSTOM
1.0000 | Freq: Three times a day (TID) | ORAL | Status: DC
  Administered 2021-05-27: 1 via ORAL

## 2021-05-27 MED ORDER — ADULT MULTIVITAMIN W/MINERALS CH
1.0000 | ORAL_TABLET | Freq: Every day | ORAL | Status: DC
  Administered 2021-05-29: 1 via ORAL
  Filled 2021-05-27 (×2): qty 1

## 2021-05-27 NOTE — Progress Notes (Addendum)
Subjective: Feeling well this morning. No complaints Denies abdominal pain, nausea, vomiting, fever, chills, brbpr, melena, change in bowel habits. Wants to eat, has been NPO since midnight.   Objective: Vital signs in last 24 hours: Temp:  [98.2 F (36.8 C)-98.6 F (37 C)] 98.3 F (36.8 C) (10/17 2030) Pulse Rate:  [78-83] 83 (10/17 1500) Resp:  [16-20] 18 (10/17 2030) BP: (112-161)/(65-71) 161/71 (10/17 2030) SpO2:  [95 %-97 %] 97 % (10/17 2030) Last BM Date: 05/25/21 General:   Alert and oriented, pleasant Head:  Normocephalic and atraumatic. Eyes: Scleral icterus present. Abdomen:  Bowel sounds present, soft, non-tender, non-distended. No HSM. Soft, non-tender umbilical hernia present. No rebound or guarding. No masses appreciated  Msk:  Symmetrical without gross deformities. Normal posture. Extremities:  Without edema. Neurologic:  Alert and  oriented x4;  grossly normal neurologically. Skin:  Warm and dry, intact without significant lesions.  Psych:  Normal mood and affect.  Intake/Output from previous day: 10/17 0701 - 10/18 0700 In: 2106.9 [P.O.:720; I.V.:1061.7; IV Piggyback:325.1] Out: -  Intake/Output this shift: No intake/output data recorded.  Lab Results: Recent Labs    05/25/21 1412 05/26/21 0444 05/27/21 0432  WBC 11.9* 10.0 7.2  HGB 13.0 11.2* 11.3*  HCT 38.2* 33.9* 34.1*  PLT 259 180 203   BMET Recent Labs    05/25/21 1412 05/26/21 0444 05/27/21 0432  NA 129* 133* 133*  K 4.1 3.7 3.7  CL 99 102 103  CO2 22 27 24   GLUCOSE 201* 154* 103*  BUN 12 9 11   CREATININE 0.87 0.65 0.68  CALCIUM 8.7* 8.4* 8.4*   LFT Recent Labs    05/25/21 1412 05/26/21 0444 05/27/21 0432  PROT 7.4 6.3* 6.4*  ALBUMIN 3.0* 2.4* 2.4*  AST 91* 55* 65*  ALT 109* 74* 68*  ALKPHOS 716* 525* 476*  BILITOT 4.6* 6.2* 6.4*   PT/INR Recent Labs    05/25/21 1412  LABPROT 12.8  INR 1.0    Studies/Results: CT Abdomen Pelvis W Contrast  Result Date:  05/25/2021 CLINICAL DATA:  Abdominal abscess/infection suspected EXAM: CT ABDOMEN AND PELVIS WITH CONTRAST TECHNIQUE: Multidetector CT imaging of the abdomen and pelvis was performed using the standard protocol following bolus administration of intravenous contrast. CONTRAST:  182m OMNIPAQUE IOHEXOL 300 MG/ML  SOLN COMPARISON:  CT April 28, 2021 FINDINGS: Lower chest: No acute abnormality. Hepatobiliary: Streak artifact from multifocal hypertense metallic material in the right hepatic lobe obscures evaluation. Bilobar biliary stents appear appropriate in positioning. Scattered dilated intrahepatic bile ducts are again seen. Stable distension of the gallbladder without pericholecystic fluid or adjacent inflammatory change. Pancreas: No pancreatic ductal dilation or evidence of acute inflammation. Spleen: Within normal limits. Adrenals/Urinary Tract: Adrenal glands are unremarkable. Kidneys are normal, without renal calculi, solid enhancing lesion, or hydronephrosis. Bladder is unremarkable for degree of distension. Stomach/Bowel: Stomach is predominantly decompressed. No pathologic dilation of small or large bowel. The appendix and terminal ileum appear normal. Colonic diverticulosis without findings of acute diverticulitis. Vascular/Lymphatic: Aortic and branch vessel atherosclerosis without abdominal aortic aneurysm. No pathologically enlarged abdominal or pelvic lymph nodes. Reproductive: Prostate is unremarkable. Other: Small fat containing paraumbilical hernia. No abdominopelvic ascites. No walled off fluid collections. No pneumoperitoneum. Musculoskeletal: Multilevel degenerative changes spine. No acute osseous abnormality. IMPRESSION: 1. No new acute process in the abdomen or pelvis identified. 2. Streak artifact from hyperdense metallic material in the right hepatic lobes obscures evaluation. Within this context there is no new intrahepatic focal process identified. Colonic diverticulosis without  findings of acute diverticulitis. 3. Stable bilobar biliary stents with scattered dilated intrahepatic bile ducts. 4. Aortic Atherosclerosis (ICD10-I70.0). Electronically Signed   By: Dahlia Bailiff M.D.   On: 05/25/2021 15:23   DG Chest Port 1 View  Result Date: 05/25/2021 CLINICAL DATA:  Questionable sepsis EXAM: PORTABLE CHEST 1 VIEW COMPARISON:  04/28/2021 FINDINGS: The heart size and mediastinal contours are within normal limits. Both lungs are clear. The visualized skeletal structures are unremarkable. IMPRESSION: No acute abnormality of the lungs in AP portable projection. Electronically Signed   By: Delanna Ahmadi M.D.   On: 05/25/2021 15:24    Assessment: 65 y.o. year old male with history of cholangiocarcinoma diagnosed in July 2022. He completed an ERCP in July 2022 with stent placement and had additional ERCP with biliary stenting to left and right system 03/21/21. He underwent right portal vein embolization in September 2022 to allow for left liver lobe hypertrophy prior to planned right hepatectomy by Dr. Ronelle Nigh [facility], [place], [place]). Inpatient late September with SIRS and felt acute infection likely related to recent portal vein embolization. Blood cultures at that time with Pseudomonas aeruginosa. No endoscopic intervention necessary.    Now presenting with recurrent fever, bacteremia with blood cultures positive for Enterococcus faecalis, elevated LFTs concerning for obstruction; notably these had previously normalized (with exception of alk phos). Admitting Tbili 4.6, Alk Phos 716, AST 91, ALT 109. This morning, T bili 6.4, AST 65, ALT 68, alk phos 476. CT on admission with biliary stents appearing in position. Scattered dilated intrahepatic bile ducts seen. WBC count 11.9 on admission, now 7.2. Afebrile today. ID on board and patient's empiric antibiotics were transitioned to IV Zosyn with plans to repeat blood cultures this morning.  TTE was also ordered, scheduled for  today.  Clinically, patient is doing well without abdominal pain, nausea, vomiting, mental status changes.  Case was discussed with Dr. Laural Golden.  ERCP not feasible here due to stent length needed.  Recommend referral to [facility], Dr. Lysle Rubens for ERCP with stent exchange while hospitalized.  Dr. Melony Overly spoke with Dr. Lysle Rubens today who stated possible ERCP tomorrow, but would let us know for sure. Waiting on final decision. Case was also discussed with [facility].  Patient's surgery has been postponed due to active infection.  Tentative date is 11/23.  Patient and family have already spoken with [facility] and are aware of plans.  Plan: Continue IV antibiotics per ID. Would best be served by transferring to tertiary care center for ERCP with stent exchange, Dr. Laural Golden suggested [facility], Dr. Lysle Rubens. Dr. Laural Golden spoke with Dr. Lysle Rubens. Waiting on final decision about timing, possible ERCP tomorrow.  Soft diet today.  NPO midnight.   Addendum: Dr. Laural Golden spoke with Dr. Lysle Rubens. Plans were made for ERCP with stent exchange tomorrow at [facility] with the stipulation of patient returning to [facility] after procedure as they have no beds available.  Patient has been updated on this.  We are trying to arrange transportation for him.  This has been quite difficult and is still being worked on by [facility] who is in communication with Dr. Jenetta Downer.    LOS: 2 days    05/27/2021, 10:01 AM   Aliene Altes, [facility]

## 2021-05-27 NOTE — Progress Notes (Signed)
*  PRELIMINARY RESULTS* Echocardiogram 2D Echocardiogram has been performed.  Jonathan Keith 05/27/2021, 4:24 PM

## 2021-05-27 NOTE — Progress Notes (Signed)
Spoke with Dr. Laural Golden. Patient would be best served as having ERCP prior to discharge. Recommending Dr. Gayleen Orem at [facility]. I have updated Aliene Altes, PA-C, who is in hospital today.

## 2021-05-27 NOTE — Telephone Encounter (Signed)
We are holding off on referring as outpatient and working on inpatient. Thanks, everyone!

## 2021-05-27 NOTE — Progress Notes (Signed)
Initial Nutrition Assessment  DOCUMENTATION CODES:   Not applicable  INTERVENTION:   Boost Breeze po TID, each supplement provides 250 kcal and 9 grams of protein -- adjust to Ensure Enlive when able.  Encourage PO intake    NUTRITION DIAGNOSIS:   Increased nutrient needs related to  (sepsis) as evidenced by estimated needs.  GOAL:   Patient will meet greater than or equal to 90% of their needs  MONITOR:   PO intake, Supplement acceptance, Diet advancement  REASON FOR ASSESSMENT:   Malnutrition Screening Tool    ASSESSMENT:   Pt with PMH of HTN, DM, cholangiocarcinoma s/p biliary stenting and R portal vein embolization with surgical intervention planned as outpatient at [facility], recent hospitalization 9/19 - 05/01/2021 for sepsis due to Pseudomonas bacteremia admitted with fever and dx with Enterococcus faecalis bacteremia.    Per GI notes, recommend inpatient ERCP prior to d/c. DM uncontrolled PTA.   Per chart review pt has continued to have a decrease in his weight from 93.4 kg (205 lb) in 3/22 down to 79.7 kg (175 lb) which would be a 15% weight loss x 7 months. Unable to confirm weight hx at this time.   Suspect pt has malnutrition, will need an exam to confirm.  Medications reviewed and include: SSI, protonix  Labs reviewed: Na 133, Alk phos: 476, AST 65, ALT 68 Lab Results  Component Value Date   HGBA1C 8.7 (H) 02/24/2021       NUTRITION - FOCUSED PHYSICAL EXAM:  RD working remotely   Diet Order:   Diet Order             Diet NPO time specified  Diet effective midnight           Diet clear liquid Room service appropriate? Yes; Fluid consistency: Thin  Diet effective now                   EDUCATION NEEDS:   Not appropriate for education at this time  Skin:  Skin Assessment: Reviewed RN Assessment  Last BM:  10/16  Height:   Ht Readings from Last 1 Encounters:  05/26/21 5' 11"  (1.803 m)    Weight:   Wt Readings from Last 1  Encounters:  05/26/21 79.7 kg    Ideal Body Weight:     BMI:  Body mass index is 24.51 kg/m.  Estimated Nutritional Needs:   Kcal:  2200-2500  Protein:  115-130 grams  Fluid:  > 2 L/day  Lockie Pares., RD, LDN, CNSC See AMiON for contact information

## 2021-05-27 NOTE — Progress Notes (Signed)
Patient ID: Jonathan Keith, male   DOB: 15-Jul-1956, 65 y.o.   MRN: [id number]  PROGRESS NOTE    Jonathan Keith  GHW:[id number] DOB: 12-Mar-1956 DOA: 05/25/2021 PCP: Ginger Organ   Brief Narrative:  65 year old male with history of hypertension, diabetes mellitus type 2, cholangiocarcinoma status post biliary stenting and right portal vein embolization with surgical intervention planned as an outpatient in [facility] in [place], recent hospitalization from 04/28/2021-05/01/2021 for sepsis due to Pseudomonas bacteremia treated with IV cefepime followed by oral ciprofloxacin on discharge presented with fever.  On presentation, temperature was 100.5 along with tachycardia and WBC of 11.9; lactic acid of 2 and then 1.8 with sodium 129.  AST 91, ALT 109, ALP 716 and total bili of 4.6.  Abdominal CT with contrast showed no acute process; showed colonic diverticulosis without diverticulitis, stable biliary stents with scattered dilated intrahepatic ducts.  He was started on IV antibiotics.  GI was consulted.  Subsequently ID was also consulted for Enterococcus bacteremia.  Assessment & Plan:   Enterococcus faecalis bacteremia: Present on admission Elevated LFTs concerning for obstruction in a patient with cholangiocarcinoma status post stenting and right portal vein embolization Sepsis: Present on admission -Presented with fever, leukocytosis, elevated lactic acid, elevated LFTs and bacteremia most likely due to biliary obstruction -GI and ID following.  Antibiotics have been changed to Zosyn alone by ID for Enterococcus faecalis bacteremia.  Follow sensitivities.  Follow repeat blood cultures. -Leukocytosis has resolved.  Currently not spiking temperatures and abdominal exam is benign. -Follow GI recommendations: Need for outpatient versus inpatient ERCP -Plans for outpatient surgical procedure at [facility] in [place] -LFTs improving but total bili still significantly  elevated  Hyponatremia -Improving.  Monitor.  Encourage oral intake  Diabetes mellitus type 2 -A1c 8.7.  Metformin on hold.  Continue CBGs with SSI.  Hypertension -Stable.  Not on medication  DVT prophylaxis: SCDs Code Status: Full Family Communication: Wife at bedside Disposition Plan: Status is: Inpatient  Remains inpatient appropriate because: Requiring IV antibiotics for bacteremia.  Awaiting further ID/GI recommendations and final sensitivities.   Consultants: GI/ID  Procedures: None.  2D echo pending  Antimicrobials:  Anti-infectives (From admission, onward)    Start     Dose/Rate Route Frequency Ordered Stop   05/26/21 1800  piperacillin-tazobactam (ZOSYN) IVPB 3.375 g        3.375 g 12.5 mL/hr over 240 Minutes Intravenous Every 8 hours 05/26/21 1621     05/26/21 1645  ampicillin (OMNIPEN) 2 g in sodium chloride 0.9 % 100 mL IVPB  Status:  Discontinued        2 g 300 mL/hr over 20 Minutes Intravenous Every 4 hours 05/26/21 1549 05/26/21 1621   05/26/21 0400  vancomycin (VANCOCIN) IVPB 1000 mg/200 mL premix  Status:  Discontinued        1,000 mg 200 mL/hr over 60 Minutes Intravenous Every 12 hours 05/25/21 1502 05/26/21 1547   05/26/21 0300  metroNIDAZOLE (FLAGYL) IVPB 500 mg  Status:  Discontinued        500 mg 100 mL/hr over 60 Minutes Intravenous Every 12 hours 05/25/21 2057 05/26/21 1546   05/25/21 2200  ceFEPIme (MAXIPIME) 2 g in sodium chloride 0.9 % 100 mL IVPB  Status:  Discontinued        2 g 200 mL/hr over 30 Minutes Intravenous Every 8 hours 05/25/21 1502 05/26/21 1545   05/25/21 1530  vancomycin (VANCOREADY) IVPB 1500 mg/300 mL        1,500  mg 150 mL/hr over 120 Minutes Intravenous  Once 05/25/21 1451 05/25/21 1716   05/25/21 1430  ceFEPIme (MAXIPIME) 2 g in sodium chloride 0.9 % 100 mL IVPB        2 g 200 mL/hr over 30 Minutes Intravenous  Once 05/25/21 1426 05/25/21 1518   05/25/21 1430  metroNIDAZOLE (FLAGYL) IVPB 500 mg        500 mg 100 mL/hr  over 60 Minutes Intravenous  Once 05/25/21 1426 05/25/21 1551   05/25/21 1430  vancomycin (VANCOCIN) IVPB 1000 mg/200 mL premix  Status:  Discontinued        1,000 mg 200 mL/hr over 60 Minutes Intravenous  Once 05/25/21 1426 05/25/21 1451        Subjective: Patient seen and examined at bedside.  States that he is hungry.  Denies worsening abdominal pain or vomiting.  No overnight fever or worsening shortness of breath reported.  Objective: Vitals:   05/26/21 0838 05/26/21 1245 05/26/21 1500 05/26/21 2030  BP: (!) 157/61 (!) 154/65 112/65 (!) 161/71  Pulse: 87 78 83   Resp: 18 20 16 18   Temp:  98.2 F (36.8 C) 98.6 F (37 C) 98.3 F (36.8 C)  TempSrc:  Oral Oral Oral  SpO2: 97% 96% 95% 97%  Weight: 79.7 kg     Height: 5\' 11"  (1.803 m)       Intake/Output Summary (Last 24 hours) at 05/27/2021 1144 Last data filed at 05/27/2021 0900 Gross per 24 hour  Intake 1286.87 ml  Output --  Net 1286.87 ml   Filed Weights   05/25/21 1400 05/26/21 0838  Weight: 82.1 kg 79.7 kg    Examination:  General exam: Appears calm and comfortable.  Currently on room air. Respiratory system: Bilateral decreased breath sounds at bases with some scattered crackles Cardiovascular system: S1 & S2 heard, Rate controlled Gastrointestinal system: Abdomen is slightly distended, soft and nontender. Normal bowel sounds heard. Extremities: No cyanosis, clubbing, edema  Central nervous system: Alert and oriented. No focal neurological deficits. Moving extremities Skin: No rashes, lesions or ulcers Psychiatry: Judgement and insight appear normal. Mood & affect appropriate.     Data Reviewed: I have personally reviewed following labs and imaging studies  CBC: Recent Labs  Lab 05/25/21 1412 05/26/21 0444 05/27/21 0432  WBC 11.9* 10.0 7.2  NEUTROABS 10.2*  --  4.7  HGB 13.0 11.2* 11.3*  HCT 38.2* 33.9* 34.1*  MCV 89.5 91.4 91.4  PLT 259 180 259   Basic Metabolic Panel: Recent Labs  Lab  05/25/21 1412 05/26/21 0444 05/27/21 0432  NA 129* 133* 133*  K 4.1 3.7 3.7  CL 99 102 103  CO2 22 27 24   GLUCOSE 201* 154* 103*  BUN 12 9 11   CREATININE 0.87 0.65 0.68  CALCIUM 8.7* 8.4* 8.4*   GFR: Estimated Creatinine Clearance: 98 mL/min (by C-G formula based on SCr of 0.68 mg/dL). Liver Function Tests: Recent Labs  Lab 05/25/21 1412 05/26/21 0444 05/27/21 0432  AST 91* 55* 65*  ALT 109* 74* 68*  ALKPHOS 716* 525* 476*  BILITOT 4.6* 6.2* 6.4*  PROT 7.4 6.3* 6.4*  ALBUMIN 3.0* 2.4* 2.4*   No results for input(s): LIPASE, AMYLASE in the last 168 hours. No results for input(s): AMMONIA in the last 168 hours. Coagulation Profile: Recent Labs  Lab 05/25/21 1412  INR 1.0   Cardiac Enzymes: No results for input(s): CKTOTAL, CKMB, CKMBINDEX, TROPONINI in the last 168 hours. BNP (last 3 results) No results for input(s): PROBNP  in the last 8760 hours. HbA1C: No results for input(s): HGBA1C in the last 72 hours. CBG: Recent Labs  Lab 05/26/21 0853 05/26/21 1134 05/26/21 1524 05/26/21 1630 05/27/21 0748  GLUCAP 86 141* 127* 111* 91   Lipid Profile: No results for input(s): CHOL, HDL, LDLCALC, TRIG, CHOLHDL, LDLDIRECT in the last 72 hours. Thyroid Function Tests: No results for input(s): TSH, T4TOTAL, FREET4, T3FREE, THYROIDAB in the last 72 hours. Anemia Panel: No results for input(s): VITAMINB12, FOLATE, FERRITIN, TIBC, IRON, RETICCTPCT in the last 72 hours. Sepsis Labs: Recent Labs  Lab 05/25/21 1412 05/25/21 1554  LATICACIDVEN 2.0* 1.8    Recent Results (from the past 240 hour(s))  Blood Culture (routine x 2)     Status: None (Preliminary result)   Collection Time: 05/25/21  2:12 PM   Specimen: BLOOD LEFT FOREARM  Result Value Ref Range Status   Specimen Description   Final    BLOOD LEFT FOREARM BOTTLES DRAWN AEROBIC AND ANAEROBIC   Special Requests Blood Culture adequate volume  Final   Culture   Final    NO GROWTH 2 DAYS Performed at [facility], [address], [place], [place] [address]    Report Status PENDING  Incomplete  Resp Panel by RT-PCR (Flu A&B, Covid) Nasopharyngeal Swab     Status: None   Collection Time: 05/25/21  2:25 PM   Specimen: Nasopharyngeal Swab; Nasopharyngeal(NP) swabs in vial transport medium  Result Value Ref Range Status   SARS Coronavirus 2 by RT PCR NEGATIVE NEGATIVE Final    Comment: (NOTE) SARS-CoV-2 target nucleic acids are NOT DETECTED.  The SARS-CoV-2 RNA is generally detectable in upper respiratory specimens during the acute phase of infection. The lowest concentration of SARS-CoV-2 viral copies this assay can detect is 138 copies/mL. A negative result does not preclude SARS-Cov-2 infection and should not be used as the sole basis for treatment or other patient management decisions. A negative result may occur with  improper specimen collection/handling, submission of specimen other than nasopharyngeal swab, presence of viral mutation(s) within the areas targeted by this assay, and inadequate number of viral copies(<138 copies/mL). A negative result must be combined with clinical observations, patient history, and epidemiological information. The expected result is Negative.  Fact Sheet for Patients:  EntrepreneurPulse.com.au  Fact Sheet for Healthcare Providers:  IncredibleEmployment.be  This test is no t yet approved or cleared by the [place] FDA and  has been authorized for detection and/or diagnosis of SARS-CoV-2 by FDA under an Emergency Use Authorization (EUA). This EUA will remain  in effect (meaning this test can be used) for the duration of the COVID-19 declaration under Section 564(b)(1) of the Act, 21 U.S.C.section 360bbb-3(b)(1), unless the authorization is terminated  or revoked sooner.       Influenza A by PCR NEGATIVE NEGATIVE Final   Influenza B by PCR NEGATIVE NEGATIVE Final    Comment: (NOTE) The Xpert Xpress  SARS-CoV-2/FLU/RSV plus assay is intended as an aid in the diagnosis of influenza from Nasopharyngeal swab specimens and should not be used as a sole basis for treatment. Nasal washings and aspirates are unacceptable for Xpert Xpress SARS-CoV-2/FLU/RSV testing.  Fact Sheet for Patients: EntrepreneurPulse.com.au  Fact Sheet for Healthcare Providers: IncredibleEmployment.be  This test is not yet approved or cleared by the [place] FDA and has been authorized for detection and/or diagnosis of SARS-CoV-2 by FDA under an Emergency Use Authorization (EUA). This EUA will remain in effect (meaning this test can be used) for the duration  of the COVID-19 declaration under Section 564(b)(1) of the Act, 21 U.S.C. section 360bbb-3(b)(1), unless the authorization is terminated or revoked.  Performed at [facility], [address] [place], [place] [address]   Urine Culture     Status: None   Collection Time: 05/25/21  2:25 PM   Specimen: Urine, Clean Catch  Result Value Ref Range Status   Specimen Description   Final    URINE, CLEAN CATCH Performed at [facility], [address], [place], [place] [address]    Special Requests   Final    NONE Performed at [facility], [address], [place], [place] [address]    Culture   Final    NO GROWTH Performed at [facility], [place] [address] [place], [place] [address]    Report Status 05/26/2021 FINAL  Final  Blood Culture (routine x 2)     Status: Abnormal (Preliminary result)   Collection Time: 05/25/21  2:39 PM   Specimen: Left Antecubital; Blood  Result Value Ref Range Status   Specimen Description   Final    LEFT ANTECUBITAL BOTTLES DRAWN AEROBIC AND ANAEROBIC Performed at [facility], [address] [place], [place] [address]    Special Requests   Final    Blood Culture adequate volume Performed at [facility], [address] [place], [place] [address]    Culture  Setup Time   Final     GRAM POSITIVE COCCI IN BOTH AEROBIC AND ANAEROBIC BOTTLES Gram Stain Report Called to,Read Back By and Verified With: BLACKWELL,S AT 0921 ON 10.17.22 BY RUCINSKI,B Organism ID to follow CRITICAL RESULT CALLED TO, READ BACK BY AND VERIFIED WITHPhilip Aspen PHARMD 1444 05/26/21 A BROWNING    Culture (A)  Final    ENTEROCOCCUS FAECALIS SUSCEPTIBILITIES TO FOLLOW Performed at [facility], [place] [address], [place], [place] [address]    Report Status PENDING  Incomplete  Blood Culture ID Panel (Reflexed)     Status: Abnormal   Collection Time: 05/25/21  2:39 PM  Result Value Ref Range Status   Enterococcus faecalis DETECTED (A) NOT DETECTED Final    Comment: CRITICAL RESULT CALLED TO, READ BACK BY AND VERIFIED WITH: Philip Aspen PHARMD 1444 05/26/21 A BROWNING    Enterococcus Faecium NOT DETECTED NOT DETECTED Final   Listeria monocytogenes NOT DETECTED NOT DETECTED Final   Staphylococcus species NOT DETECTED NOT DETECTED Final   Staphylococcus aureus (BCID) NOT DETECTED NOT DETECTED Final   Staphylococcus epidermidis NOT DETECTED NOT DETECTED Final   Staphylococcus lugdunensis NOT DETECTED NOT DETECTED Final   Streptococcus species NOT DETECTED NOT DETECTED Final   Streptococcus agalactiae NOT DETECTED NOT DETECTED Final   Streptococcus pneumoniae NOT DETECTED NOT DETECTED Final   Streptococcus pyogenes NOT DETECTED NOT DETECTED Final   A.calcoaceticus-baumannii NOT DETECTED NOT DETECTED Final   Bacteroides fragilis NOT DETECTED NOT DETECTED Final   Enterobacterales NOT DETECTED NOT DETECTED Final   Enterobacter cloacae complex NOT DETECTED NOT DETECTED Final   Escherichia coli NOT DETECTED NOT DETECTED Final   Klebsiella aerogenes NOT DETECTED NOT DETECTED Final   Klebsiella oxytoca NOT DETECTED NOT DETECTED Final   Klebsiella pneumoniae NOT DETECTED NOT DETECTED Final   Proteus species NOT DETECTED NOT DETECTED Final   Salmonella species NOT DETECTED NOT DETECTED Final   Serratia  marcescens NOT DETECTED NOT DETECTED Final   Haemophilus influenzae NOT DETECTED NOT DETECTED Final   Neisseria meningitidis NOT DETECTED NOT DETECTED Final   Pseudomonas aeruginosa NOT DETECTED  NOT DETECTED Final   Stenotrophomonas maltophilia NOT DETECTED NOT DETECTED Final   Candida albicans NOT DETECTED NOT DETECTED Final   Candida auris NOT DETECTED NOT DETECTED Final   Candida glabrata NOT DETECTED NOT DETECTED Final   Candida krusei NOT DETECTED NOT DETECTED Final   Candida parapsilosis NOT DETECTED NOT DETECTED Final   Candida tropicalis NOT DETECTED NOT DETECTED Final   Cryptococcus neoformans/gattii NOT DETECTED NOT DETECTED Final   Vancomycin resistance NOT DETECTED NOT DETECTED Final    Comment: Performed at [facility], [place] [address], [place], [place] [address]  Culture, blood (routine x 2)     Status: None (Preliminary result)   Collection Time: 05/27/21  4:32 AM   Specimen: BLOOD  Result Value Ref Range Status   Specimen Description BLOOD RIGHT ANTECUBITAL  Final   Special Requests   Final    BOTTLES DRAWN AEROBIC AND ANAEROBIC Blood Culture results may not be optimal due to an excessive volume of blood received in culture bottles   Culture   Final    NO GROWTH < 12 HOURS Performed at [facility], [address], [place],  [place] [address]    Report Status PENDING  Incomplete  Culture, blood (routine x 2)     Status: None (Preliminary result)   Collection Time: 05/27/21  4:36 AM   Specimen: BLOOD  Result Value Ref Range Status   Specimen Description BLOOD BLOOD RIGHT HAND  Final   Special Requests   Final    BOTTLES DRAWN AEROBIC AND ANAEROBIC Blood Culture results may not be optimal due to an excessive volume of blood received in culture bottles   Culture   Final    NO GROWTH < 12 HOURS Performed at [facility], [address] [place], [place] [address]    Report Status PENDING  Incomplete         Radiology Studies: CT Abdomen Pelvis W  Contrast  Result Date: 05/25/2021 CLINICAL DATA:  Abdominal abscess/infection suspected EXAM: CT ABDOMEN AND PELVIS WITH CONTRAST TECHNIQUE: Multidetector CT imaging of the abdomen and pelvis was performed using the standard protocol following bolus administration of intravenous contrast. CONTRAST:  168mL OMNIPAQUE IOHEXOL 300 MG/ML  SOLN COMPARISON:  CT April 28, 2021 FINDINGS: Lower chest: No acute abnormality. Hepatobiliary: Streak artifact from multifocal hypertense metallic material in the right hepatic lobe obscures evaluation. Bilobar biliary stents appear appropriate in positioning. Scattered dilated intrahepatic bile ducts are again seen. Stable distension of the gallbladder without pericholecystic fluid or adjacent inflammatory change. Pancreas: No pancreatic ductal dilation or evidence of acute inflammation. Spleen: Within normal limits. Adrenals/Urinary Tract: Adrenal glands are unremarkable. Kidneys are normal, without renal calculi, solid enhancing lesion, or hydronephrosis. Bladder is unremarkable for degree of distension. Stomach/Bowel: Stomach is predominantly decompressed. No pathologic dilation of small or large bowel. The appendix and terminal ileum appear normal. Colonic diverticulosis without findings of acute diverticulitis. Vascular/Lymphatic: Aortic and branch vessel atherosclerosis without abdominal aortic aneurysm. No pathologically enlarged abdominal or pelvic lymph nodes. Reproductive: Prostate is unremarkable. Other: Small fat containing paraumbilical hernia. No abdominopelvic ascites. No walled off fluid collections. No pneumoperitoneum. Musculoskeletal: Multilevel degenerative changes spine. No acute osseous abnormality. IMPRESSION: 1. No new acute process in the abdomen or pelvis identified. 2. Streak artifact from hyperdense metallic material in the right hepatic lobes obscures evaluation. Within this context there is no new intrahepatic focal process identified. Colonic  diverticulosis without findings of acute diverticulitis. 3. Stable bilobar biliary stents with scattered dilated intrahepatic bile  ducts. 4. Aortic Atherosclerosis (ICD10-I70.0). Electronically Signed   By: Dahlia Bailiff M.D.   On: 05/25/2021 15:23   DG Chest Port 1 View  Result Date: 05/25/2021 CLINICAL DATA:  Questionable sepsis EXAM: PORTABLE CHEST 1 VIEW COMPARISON:  04/28/2021 FINDINGS: The heart size and mediastinal contours are within normal limits. Both lungs are clear. The visualized skeletal structures are unremarkable. IMPRESSION: No acute abnormality of the lungs in AP portable projection. Electronically Signed   By: Delanna Ahmadi M.D.   On: 05/25/2021 15:24        Scheduled Meds:  feeding supplement  237 mL Oral BID BM   insulin aspart  0-5 Units Subcutaneous QHS   insulin aspart  0-9 Units Subcutaneous TID WC   pantoprazole  40 mg Oral Daily   Continuous Infusions:  piperacillin-tazobactam (ZOSYN)  IV 3.375 g (05/27/21 0515)          Aline August, MD [facility] 05/27/2021, 11:44 AM

## 2021-05-27 NOTE — TOC Initial Note (Signed)
Transition of Care [facility]) - Initial/Assessment Note    Patient Details  Name: Jonathan Keith MRN: [id number] Date of Birth: 1955-12-19  Transition of [facility] CM/SW Contact:    Iona Beard, Painter Phone Number: 05/27/2021, 12:07 PM  Clinical Narrative:                 Pt is high risk for readmission. CSW spoke with pt to complete assessment. Pt states that he lives with his wife. Pt is normally independent in completing his ADLs but his wife will assist if needed. Pt states that he is able to provide his own transportation when needed. Pt has not had [place] services and he does not have or use any DME. TOC to follow for possible d/c needs.   Expected Discharge Plan: Home/Self Care Barriers to Discharge: Continued Medical Work up   Patient Goals and CMS Choice Patient states their goals for this hospitalization and ongoing recovery are:: Return home CMS Medicare.gov Compare Post Acute Care list provided to:: Patient Choice offered to / list presented to : Patient  Expected Discharge Plan and Services Expected Discharge Plan: Home/Self Care In-house Referral: Clinical Social Work Discharge Planning Services: CM Consult   Living arrangements for the past 2 months: Single Family Home                                      Prior Living Arrangements/Services Living arrangements for the past 2 months: Single Family Home Lives with:: Self Patient language and need for interpreter reviewed:: Yes Do you feel safe going back to the place where you live?: Yes      Need for Family Participation in Patient Care: Yes (Comment) Care giver support system in place?: Yes (comment)   Criminal Activity/Legal Involvement Pertinent to Current Situation/Hospitalization: No - Comment as needed  Activities of Daily Living Home Assistive Devices/Equipment: None ADL Screening (condition at time of admission) Patient's cognitive ability adequate to safely complete daily activities?: Yes Is  the patient deaf or have difficulty hearing?: No Does the patient have difficulty seeing, even when wearing glasses/contacts?: No Does the patient have difficulty concentrating, remembering, or making decisions?: No Patient able to express need for assistance with ADLs?: Yes Does the patient have difficulty dressing or bathing?: No Independently performs ADLs?: Yes (appropriate for developmental age) Does the patient have difficulty walking or climbing stairs?: No Weakness of Legs: Both (at times) Weakness of Arms/Hands: Both (at times)  Permission Sought/Granted                  Emotional Assessment Appearance:: Appears stated age Attitude/Demeanor/Rapport: Engaged Affect (typically observed): Accepting Orientation: : Oriented to Self, Oriented to Place, Oriented to  Time, Oriented to Situation Alcohol / Substance Use: Not Applicable Psych Involvement: No (comment)  Admission diagnosis:  Cholangiocarcinoma ([place]) [C22.1] Sepsis ([place]) [A41.9] Sepsis, due to unspecified organism, unspecified whether acute organ dysfunction present [facility]) [A41.9] Patient Active Problem List   Diagnosis Date Noted   Sepsis ([place]) 05/25/2021   SIRS (systemic inflammatory response syndrome) ([place]) 04/28/2021   Acute febrile illness 03/12/2021   Acute cholecystitis 03/12/2021   Diverticulitis 03/12/2021   Transaminitis 03/12/2021   Leukocytosis 03/12/2021   Hyponatremia 03/12/2021   Hyperglycemia due to diabetes mellitus ([place]) 03/12/2021   GERD (gastroesophageal reflux disease) 03/12/2021   Hypoalbuminemia due to protein-calorie malnutrition ([place]) 03/12/2021   Cholangiocarcinoma (HCC)    Elevated LFTs  Choledocholithiasis    Obstructive jaundice 02/24/2021   Class 1 obesity due to excess calories with body mass index (BMI) of 31.0 to 31.9 in adult 02/24/2021   Diabetes mellitus ([place]) 02/24/2021   Dyslipidemia 02/24/2021   Hypertension 02/24/2021   Tobacco dependence 02/24/2021   PCP:   [name], PA-C Pharmacy:   [id number] [place], [place] AT [address] [address] Phone: [phone] Fax: [phone]     Social Determinants of Health (SDOH) Interventions    Readmission Risk Interventions Readmission Risk Prevention Plan 05/27/2021  Transportation Screening Complete  Home Care Screening Complete  Medication Review (RN CM) Complete  Some recent data might be hidden

## 2021-05-28 DIAGNOSIS — C221 Intrahepatic bile duct carcinoma: Secondary | ICD-10-CM

## 2021-05-28 DIAGNOSIS — A419 Sepsis, unspecified organism: Secondary | ICD-10-CM

## 2021-05-28 LAB — CBC WITH DIFFERENTIAL/PLATELET
Abs Immature Granulocytes: 0.02 10*3/uL (ref 0.00–0.07)
Basophils Absolute: 0 10*3/uL (ref 0.0–0.1)
Basophils Relative: 1 %
Eosinophils Absolute: 0.7 10*3/uL — ABNORMAL HIGH (ref 0.0–0.5)
Eosinophils Relative: 9 %
HCT: 33.4 % — ABNORMAL LOW (ref 39.0–52.0)
Hemoglobin: 11.2 g/dL — ABNORMAL LOW (ref 13.0–17.0)
Immature Granulocytes: 0 %
Lymphocytes Relative: 18 %
Lymphs Abs: 1.3 10*3/uL (ref 0.7–4.0)
MCH: 29.8 pg (ref 26.0–34.0)
MCHC: 33.5 g/dL (ref 30.0–36.0)
MCV: 88.8 fL (ref 80.0–100.0)
Monocytes Absolute: 0.8 10*3/uL (ref 0.1–1.0)
Monocytes Relative: 11 %
Neutro Abs: 4.4 10*3/uL (ref 1.7–7.7)
Neutrophils Relative %: 61 %
Platelets: 217 10*3/uL (ref 150–400)
RBC: 3.76 MIL/uL — ABNORMAL LOW (ref 4.22–5.81)
RDW: 15.3 % (ref 11.5–15.5)
WBC: 7.3 10*3/uL (ref 4.0–10.5)
nRBC: 0 % (ref 0.0–0.2)

## 2021-05-28 LAB — COMPREHENSIVE METABOLIC PANEL
ALT: 85 U/L — ABNORMAL HIGH (ref 0–44)
AST: 103 U/L — ABNORMAL HIGH (ref 15–41)
Albumin: 2.3 g/dL — ABNORMAL LOW (ref 3.5–5.0)
Alkaline Phosphatase: 524 U/L — ABNORMAL HIGH (ref 38–126)
Anion gap: 6 (ref 5–15)
BUN: 10 mg/dL (ref 8–23)
CO2: 24 mmol/L (ref 22–32)
Calcium: 8.6 mg/dL — ABNORMAL LOW (ref 8.9–10.3)
Chloride: 101 mmol/L (ref 98–111)
Creatinine, Ser: 0.65 mg/dL (ref 0.61–1.24)
GFR, Estimated: >60 mL/min (ref >60)
Glucose, Bld: 110 mg/dL — ABNORMAL HIGH (ref 70–99)
Potassium: 3.7 mmol/L (ref 3.5–5.1)
Sodium: 131 mmol/L — ABNORMAL LOW (ref 135–145)
Total Bilirubin: 6.7 mg/dL — ABNORMAL HIGH (ref 0.3–1.2)
Total Protein: 6.5 g/dL (ref 6.5–8.1)

## 2021-05-28 LAB — CULTURE, BLOOD (ROUTINE X 2): Special Requests: ADEQUATE

## 2021-05-28 LAB — MAGNESIUM: Magnesium: 2.1 mg/dL (ref 1.7–2.4)

## 2021-05-28 LAB — GLUCOSE, CAPILLARY
Glucose-Capillary: 93 mg/dL (ref 70–99)
Glucose-Capillary: 117 mg/dL — ABNORMAL HIGH (ref 70–99)
Glucose-Capillary: 126 mg/dL — ABNORMAL HIGH (ref 70–99)
Glucose-Capillary: 250 mg/dL — ABNORMAL HIGH (ref 70–99)

## 2021-05-28 NOTE — Progress Notes (Signed)
Pt transported by [facility] via stretcher to [facility] for procedure.

## 2021-05-28 NOTE — Progress Notes (Signed)
Patient being transferred to [place] this morning for ERCP with stent exchange by Dr. Gayleen Orem. States he is feeling okay today, denies any pain, chills, BRBPR, melena,  nausea or vomiting. No fevers over night, continuing to receive IV zosyn 3.375g Q8H for Enterococcus faecalis bacteremia per ID's recommendation. He remains NPO.  WBC 7.2 this morning, LFTs continue to trend up, VSS with T max of 99.2 today.  Patient will be transferred back to [facility] s/p ERCP with biliary stent exchange, with plans for partial hepatectomy at [facility] clinic 07/02/21, GI will continue to follow.

## 2021-05-28 NOTE — Progress Notes (Signed)
PROGRESS NOTE    DAION GINSBERG  ZHG:[id number] DOB: 10/16/1955 DOA: 05/25/2021 PCP: [name], PA-C     Brief Narrative:  DASHTON Keith is 65 year old male with history of hypertension, diabetes mellitus type 2, cholangiocarcinoma status post biliary stenting and right portal vein embolization with surgical intervention planned as an outpatient in [facility] in [place], recent hospitalization from 04/28/2021-05/01/2021 for sepsis due to Pseudomonas bacteremia treated with IV cefepime followed by oral ciprofloxacin on discharge presented with fever.  On presentation, temperature was 100.5 along with tachycardia and WBC of 11.9; lactic acid of 2 and then 1.8 with sodium 129.  AST 91, ALT 109, ALP 716 and total bili of 4.6.  Abdominal CT with contrast showed no acute process; showed colonic diverticulosis without diverticulitis, stable biliary stents with scattered dilated intrahepatic ducts.  He was started on IV antibiotics.  GI was consulted.  Subsequently ID was also consulted for Enterococcus bacteremia.   New events last 24 hours / Subjective: Patient laying in bed without complaints of abdominal pain or nausea or vomiting.  He is awaiting transfer to [facility] for ERCP today.  Assessment & Plan:   Principal Problem:   Sepsis ([place]) Active Problems:   Diabetes mellitus ([place])   Hypertension   Elevated LFTs   Cholangiocarcinoma ([place])   Acute febrile illness   Hyponatremia   Hyperbilirubinemia   Sepsis secondary to Enterococcus faecalis bacteremia -Sepsis present on admission with concern for obstruction with history of cholangiocarcinoma status post stenting and right portal vein embolization -Appreciate GI, infectious disease -Planning for ERCP at [facility] today -Remains on IV Zosyn -Repeat blood cultures are negative today -Has plans for partial hepatectomy at [facility] 07/02/2021  Diabetes mellitus type 2 -A1c 8.7 -SSI   Elevated LFT -Continue to trend   DVT  prophylaxis:  SCDs Start: 05/25/21 2058  Code Status:     Code Status Orders  (From admission, onward)           Start     Ordered   05/25/21 2058  Full code  Continuous        05/25/21 2057           Code Status History     Date Active Date Inactive Code Status Order ID Comments User Context   04/28/2021 1715 05/01/2021 2037 Full Code [id number]  Bethena Roys, MD ED   03/12/2021 0348 03/14/2021 1753 Full Code [id number]  Bernadette Hoit, DO ED   02/24/2021 1907 03/01/2021 0005 Full Code [id number]  Karmen Bongo, MD Inpatient      Family Communication: At bedside  Disposition Plan:  Status is: Inpatient  Remains inpatient appropriate because: ERCP today and rising LFTs      Consultants:  GI  ID   Procedures:  ERCP at [facility] 10/19  Antimicrobials:  Anti-infectives (From admission, onward)    Start     Dose/Rate Route Frequency Ordered Stop   05/26/21 1800  piperacillin-tazobactam (ZOSYN) IVPB 3.375 g        3.375 g 12.5 mL/hr over 240 Minutes Intravenous Every 8 hours 05/26/21 1621     05/26/21 1645  ampicillin (OMNIPEN) 2 g in sodium chloride 0.9 % 100 mL IVPB  Status:  Discontinued        2 g 300 mL/hr over 20 Minutes Intravenous Every 4 hours 05/26/21 1549 05/26/21 1621   05/26/21 0400  vancomycin (VANCOCIN) IVPB 1000 mg/200 mL premix  Status:  Discontinued        1,000 mg  200 mL/hr over 60 Minutes Intravenous Every 12 hours 05/25/21 1502 05/26/21 1547   05/26/21 0300  metroNIDAZOLE (FLAGYL) IVPB 500 mg  Status:  Discontinued        500 mg 100 mL/hr over 60 Minutes Intravenous Every 12 hours 05/25/21 2057 05/26/21 1546   05/25/21 2200  ceFEPIme (MAXIPIME) 2 g in sodium chloride 0.9 % 100 mL IVPB  Status:  Discontinued        2 g 200 mL/hr over 30 Minutes Intravenous Every 8 hours 05/25/21 1502 05/26/21 1545   05/25/21 1530  vancomycin (VANCOREADY) IVPB 1500 mg/300 mL        1,500 mg 150 mL/hr over 120 Minutes Intravenous  Once 05/25/21 1451  05/25/21 1716   05/25/21 1430  ceFEPIme (MAXIPIME) 2 g in sodium chloride 0.9 % 100 mL IVPB        2 g 200 mL/hr over 30 Minutes Intravenous  Once 05/25/21 1426 05/25/21 1518   05/25/21 1430  metroNIDAZOLE (FLAGYL) IVPB 500 mg        500 mg 100 mL/hr over 60 Minutes Intravenous  Once 05/25/21 1426 05/25/21 1551   05/25/21 1430  vancomycin (VANCOCIN) IVPB 1000 mg/200 mL premix  Status:  Discontinued        1,000 mg 200 mL/hr over 60 Minutes Intravenous  Once 05/25/21 1426 05/25/21 1451        Objective: Vitals:   05/27/21 2300 05/28/21 0631 05/28/21 0920 05/28/21 0932  BP: (!) 158/82 (!) 159/91 (!) 147/87 (!) 147/87  Pulse: 83 77 79 79  Resp:  19 18 18   Temp: 98.5 F (36.9 C) 97.6 F (36.4 C) 99.2 F (37.3 C) 99.2 F (37.3 C)  TempSrc: Oral   Oral  SpO2: 93% 96% 96% 96%  Weight:      Height:        Intake/Output Summary (Last 24 hours) at 05/28/2021 1145 Last data filed at 05/28/2021 0700 Gross per 24 hour  Intake 369.17 ml  Output --  Net 369.17 ml   Filed Weights   05/25/21 1400 05/26/21 0838  Weight: 82.1 kg 79.7 kg    Examination:  General exam: Appears calm and comfortable  Respiratory system: Clear to auscultation. Respiratory effort normal. No respiratory distress. No conversational dyspnea.  Cardiovascular system: S1 & S2 heard, RRR. No murmurs. No pedal edema. Gastrointestinal system: Abdomen is nondistended, soft and nontender. Normal bowel sounds heard. Central nervous system: Alert and oriented. No focal neurological deficits. Speech clear.  Extremities: Symmetric in appearance  Skin: No rashes, lesions or ulcers on exposed skin  Psychiatry: Judgement and insight appear normal. Mood & affect appropriate.   Data Reviewed: I have personally reviewed following labs and imaging studies  CBC: Recent Labs  Lab 05/25/21 1412 05/26/21 0444 05/27/21 0432 05/28/21 0427  WBC 11.9* 10.0 7.2 7.3  NEUTROABS 10.2*  --  4.7 4.4  HGB 13.0 11.2* 11.3* 11.2*   HCT 38.2* 33.9* 34.1* 33.4*  MCV 89.5 91.4 91.4 88.8  PLT 259 180 203 468   Basic Metabolic Panel: Recent Labs  Lab 05/25/21 1412 05/26/21 0444 05/27/21 0432 05/28/21 0427  NA 129* 133* 133* 131*  K 4.1 3.7 3.7 3.7  CL 99 102 103 101  CO2 22 27 24 24   GLUCOSE 201* 154* 103* 110*  BUN 12 9 11 10   CREATININE 0.87 0.65 0.68 0.65  CALCIUM 8.7* 8.4* 8.4* 8.6*  MG  --   --   --  2.1   GFR: Estimated Creatinine Clearance:  98 mL/min (by C-G formula based on SCr of 0.65 mg/dL). Liver Function Tests: Recent Labs  Lab 05/25/21 1412 05/26/21 0444 05/27/21 0432 05/28/21 0427  AST 91* 55* 65* 103*  ALT 109* 74* 68* 85*  ALKPHOS 716* 525* 476* 524*  BILITOT 4.6* 6.2* 6.4* 6.7*  PROT 7.4 6.3* 6.4* 6.5  ALBUMIN 3.0* 2.4* 2.4* 2.3*   No results for input(s): LIPASE, AMYLASE in the last 168 hours. No results for input(s): AMMONIA in the last 168 hours. Coagulation Profile: Recent Labs  Lab 05/25/21 1412  INR 1.0   Cardiac Enzymes: No results for input(s): CKTOTAL, CKMB, CKMBINDEX, TROPONINI in the last 168 hours. BNP (last 3 results) No results for input(s): PROBNP in the last 8760 hours. HbA1C: No results for input(s): HGBA1C in the last 72 hours. CBG: Recent Labs  Lab 05/26/21 2117 05/27/21 0748 05/27/21 1324 05/27/21 1653 05/28/21 0909  GLUCAP 106* 91 137* 153* 117*   Lipid Profile: No results for input(s): CHOL, HDL, LDLCALC, TRIG, CHOLHDL, LDLDIRECT in the last 72 hours. Thyroid Function Tests: No results for input(s): TSH, T4TOTAL, FREET4, T3FREE, THYROIDAB in the last 72 hours. Anemia Panel: No results for input(s): VITAMINB12, FOLATE, FERRITIN, TIBC, IRON, RETICCTPCT in the last 72 hours. Sepsis Labs: Recent Labs  Lab 05/25/21 1412 05/25/21 1554  LATICACIDVEN 2.0* 1.8    Recent Results (from the past 240 hour(s))  Blood Culture (routine x 2)     Status: None (Preliminary result)   Collection Time: 05/25/21  2:12 PM   Specimen: BLOOD LEFT FOREARM   Result Value Ref Range Status   Specimen Description   Final    BLOOD LEFT FOREARM BOTTLES DRAWN AEROBIC AND ANAEROBIC   Special Requests Blood Culture adequate volume  Final   Culture   Final    NO GROWTH 3 DAYS Performed at [facility], [address] [place], [place] [address]    Report Status PENDING  Incomplete  Resp Panel by RT-PCR (Flu A&B, Covid) Nasopharyngeal Swab     Status: None   Collection Time: 05/25/21  2:25 PM   Specimen: Nasopharyngeal Swab; Nasopharyngeal(NP) swabs in vial transport medium  Result Value Ref Range Status   SARS Coronavirus 2 by RT PCR NEGATIVE NEGATIVE Final    Comment: (NOTE) SARS-CoV-2 target nucleic acids are NOT DETECTED.  The SARS-CoV-2 RNA is generally detectable in upper respiratory specimens during the acute phase of infection. The lowest concentration of SARS-CoV-2 viral copies this assay can detect is 138 copies/mL. A negative result does not preclude SARS-Cov-2 infection and should not be used as the sole basis for treatment or other patient management decisions. A negative result may occur with  improper specimen collection/handling, submission of specimen other than nasopharyngeal swab, presence of viral mutation(s) within the areas targeted by this assay, and inadequate number of viral copies(<138 copies/mL). A negative result must be combined with clinical observations, patient history, and epidemiological information. The expected result is Negative.  Fact Sheet for Patients:  EntrepreneurPulse.com.au  Fact Sheet for Healthcare Providers:  IncredibleEmployment.be  This test is no t yet approved or cleared by the [place] FDA and  has been authorized for detection and/or diagnosis of SARS-CoV-2 by FDA under an Emergency Use Authorization (EUA). This EUA will remain  in effect (meaning this test can be used) for the duration of the COVID-19 declaration under Section 564(b)(1) of the  Act, 21 U.S.C.section 360bbb-3(b)(1), unless the authorization is terminated  or revoked sooner.       Influenza  A by PCR NEGATIVE NEGATIVE Final   Influenza B by PCR NEGATIVE NEGATIVE Final    Comment: (NOTE) The Xpert Xpress SARS-CoV-2/FLU/RSV plus assay is intended as an aid in the diagnosis of influenza from Nasopharyngeal swab specimens and should not be used as a sole basis for treatment. Nasal washings and aspirates are unacceptable for Xpert Xpress SARS-CoV-2/FLU/RSV testing.  Fact Sheet for Patients: EntrepreneurPulse.com.au  Fact Sheet for Healthcare Providers: IncredibleEmployment.be  This test is not yet approved or cleared by the [place] FDA and has been authorized for detection and/or diagnosis of SARS-CoV-2 by FDA under an Emergency Use Authorization (EUA). This EUA will remain in effect (meaning this test can be used) for the duration of the COVID-19 declaration under Section 564(b)(1) of the Act, 21 U.S.C. section 360bbb-3(b)(1), unless the authorization is terminated or revoked.  Performed at [facility], [address] [place], [place] [address]   Urine Culture     Status: None   Collection Time: 05/25/21  2:25 PM   Specimen: Urine, Clean Catch  Result Value Ref Range Status   Specimen Description   Final    URINE, CLEAN CATCH Performed at [facility], [address] [place], [place] [address]    Special Requests   Final    NONE Performed at [facility], [address] [place], [place] [address]    Culture   Final    NO GROWTH Performed at [facility], [place] [address], [place], [place] [address]    Report Status 05/26/2021 FINAL  Final  Blood Culture (routine x 2)     Status: Abnormal   Collection Time: 05/25/21  2:39 PM   Specimen: Left Antecubital; Blood  Result Value Ref Range Status   Specimen Description   Final    LEFT ANTECUBITAL BOTTLES DRAWN AEROBIC AND ANAEROBIC Performed at [facility], [address], [place], [place] [address]    Special Requests   Final    Blood Culture adequate volume Performed at [facility], [address], [place], [place] [address]    Culture  Setup Time   Final    GRAM POSITIVE COCCI IN BOTH AEROBIC AND ANAEROBIC BOTTLES Gram Stain Report Called to,Read Back By and Verified With: BLACKWELL,S AT 4163 ON 10.17.22 BY RUCINSKI,B CRITICAL RESULT CALLED TO, READ BACK BY AND VERIFIED WITH: [place] 8453 05/26/21 A BROWNING Performed at [facility], [place] [address] [place], [place] [address]    Culture ENTEROCOCCUS FAECALIS (A)  Final   Report Status 05/28/2021 FINAL  Final   Organism ID, Bacteria ENTEROCOCCUS FAECALIS  Final      Susceptibility   Enterococcus faecalis - MIC*    AMPICILLIN <=2 SENSITIVE Sensitive     VANCOMYCIN 1 SENSITIVE Sensitive     GENTAMICIN SYNERGY SENSITIVE Sensitive     * ENTEROCOCCUS FAECALIS  Blood Culture ID Panel (Reflexed)     Status: Abnormal   Collection Time: 05/25/21  2:39 PM  Result Value Ref Range Status   Enterococcus faecalis DETECTED (A) NOT DETECTED Final    Comment: CRITICAL RESULT CALLED TO, READ BACK BY AND VERIFIED WITH: Philip Aspen PHARMD [id number] 05/26/21 A BROWNING    Enterococcus Faecium NOT DETECTED NOT DETECTED Final   Listeria monocytogenes NOT DETECTED NOT DETECTED Final   Staphylococcus species NOT DETECTED NOT DETECTED Final   Staphylococcus aureus (BCID) NOT DETECTED NOT DETECTED Final   Staphylococcus epidermidis NOT DETECTED NOT DETECTED Final   Staphylococcus lugdunensis NOT DETECTED NOT  DETECTED Final   Streptococcus species NOT DETECTED NOT DETECTED Final   Streptococcus agalactiae NOT DETECTED NOT DETECTED Final   Streptococcus pneumoniae NOT DETECTED NOT DETECTED Final   Streptococcus pyogenes NOT DETECTED NOT DETECTED Final   A.calcoaceticus-baumannii NOT DETECTED NOT DETECTED Final   Bacteroides fragilis NOT DETECTED NOT DETECTED Final   Enterobacterales NOT DETECTED  NOT DETECTED Final   Enterobacter cloacae complex NOT DETECTED NOT DETECTED Final   Escherichia coli NOT DETECTED NOT DETECTED Final   Klebsiella aerogenes NOT DETECTED NOT DETECTED Final   Klebsiella oxytoca NOT DETECTED NOT DETECTED Final   Klebsiella pneumoniae NOT DETECTED NOT DETECTED Final   Proteus species NOT DETECTED NOT DETECTED Final   Salmonella species NOT DETECTED NOT DETECTED Final   Serratia marcescens NOT DETECTED NOT DETECTED Final   Haemophilus influenzae NOT DETECTED NOT DETECTED Final   Neisseria meningitidis NOT DETECTED NOT DETECTED Final   Pseudomonas aeruginosa NOT DETECTED NOT DETECTED Final   Stenotrophomonas maltophilia NOT DETECTED NOT DETECTED Final   Candida albicans NOT DETECTED NOT DETECTED Final   Candida auris NOT DETECTED NOT DETECTED Final   Candida glabrata NOT DETECTED NOT DETECTED Final   Candida krusei NOT DETECTED NOT DETECTED Final   Candida parapsilosis NOT DETECTED NOT DETECTED Final   Candida tropicalis NOT DETECTED NOT DETECTED Final   Cryptococcus neoformans/gattii NOT DETECTED NOT DETECTED Final   Vancomycin resistance NOT DETECTED NOT DETECTED Final    Comment: Performed at [facility], [address] [place], [place] [address]  Culture, blood (routine x 2)     Status: None (Preliminary result)   Collection Time: 05/27/21  4:32 AM   Specimen: BLOOD  Result Value Ref Range Status   Specimen Description BLOOD RIGHT ANTECUBITAL  Final   Special Requests   Final    BOTTLES DRAWN AEROBIC AND ANAEROBIC Blood Culture results may not be optimal due to an excessive volume of blood received in culture bottles   Culture   Final    NO GROWTH 1 DAY Performed at [facility], [address] [place], [place] [address]    Report Status PENDING  Incomplete  Culture, blood (routine x 2)     Status: None (Preliminary result)   Collection Time: 05/27/21  4:36 AM   Specimen: BLOOD  Result Value Ref Range Status   Specimen Description BLOOD  BLOOD RIGHT HAND  Final   Special Requests   Final    BOTTLES DRAWN AEROBIC AND ANAEROBIC Blood Culture results may not be optimal due to an excessive volume of blood received in culture bottles   Culture   Final    NO GROWTH 1 DAY Performed at [facility], [address], [place], [place] [address]    Report Status PENDING  Incomplete      Radiology Studies: ECHOCARDIOGRAM COMPLETE  Result Date: 05/27/2021    ECHOCARDIOGRAM REPORT   Patient Name:   [name] Date of Exam: 05/27/2021 Medical Rec #:  [id number]       Height:       71.0 in Accession #:    [id number]      Weight:       175.7 lb Date of Birth:  [birth year]       BSA:          1.996 m Patient Age:    56 years        BP:           161/71 mmHg Patient Gender: M  HR:           83 bpm. Exam Location:  [facility] Procedure: 2D Echo, Cardiac Doppler and Color Doppler Indications:    Bacteremia R78.81  History:        Patient has no prior history of Echocardiogram examinations.                 Risk Factors:Hypertension, Diabetes, Dyslipidemia and Current                 Smoker.  Sonographer:    Alvino Chapel RCS Referring Phys: [id number] [place]  1. Left ventricular ejection fraction, by estimation, is 55 to 60%. The left ventricle has normal function. The left ventricle has no regional wall motion abnormalities. Left ventricular diastolic parameters were normal.  2. Right ventricular systolic function is normal. The right ventricular size is normal. Tricuspid regurgitation signal is inadequate for assessing PA pressure.  3. The mitral valve is grossly normal. Trivial mitral valve regurgitation.  4. The aortic valve is tricuspid. There is mild calcification of the aortic valve. Aortic valve regurgitation is not visualized.  5. The inferior vena cava is normal in size with greater than 50% respiratory variability, suggesting right atrial pressure of 3 mmHg.  6. No obvious valvular vegetations. Comparison(s): No  prior Echocardiogram. FINDINGS  Left Ventricle: Left ventricular ejection fraction, by estimation, is 55 to 60%. The left ventricle has normal function. The left ventricle has no regional wall motion abnormalities. The left ventricular internal cavity size was normal in size. There is  no left ventricular hypertrophy. Left ventricular diastolic parameters were normal. Right Ventricle: The right ventricular size is normal. No increase in right ventricular wall thickness. Right ventricular systolic function is normal. Tricuspid regurgitation signal is inadequate for assessing PA pressure. Left Atrium: Left atrial size was normal in size. Right Atrium: Right atrial size was normal in size. Pericardium: There is no evidence of pericardial effusion. Mitral Valve: The mitral valve is grossly normal. Trivial mitral valve regurgitation. Tricuspid Valve: The tricuspid valve is grossly normal. Tricuspid valve regurgitation is trivial. Aortic Valve: The aortic valve is tricuspid. There is mild calcification of the aortic valve. There is mild aortic valve annular calcification. Aortic valve regurgitation is not visualized. Pulmonic Valve: The pulmonic valve was grossly normal. Pulmonic valve regurgitation is trivial. Aorta: The aortic root is normal in size and structure. Venous: The inferior vena cava is normal in size with greater than 50% respiratory variability, suggesting right atrial pressure of 3 mmHg. IAS/Shunts: No atrial level shunt detected by color flow Doppler.  LEFT VENTRICLE PLAX 2D LVIDd:         3.90 cm   Diastology LVIDs:         2.70 cm   LV e' medial:    10.40 cm/s LV PW:         0.90 cm   LV E/e' medial:  4.7 LV IVS:        1.00 cm   LV e' lateral:   9.79 cm/s LVOT diam:     2.00 cm   LV E/e' lateral: 5.0 LV SV:         45 LV SV Index:   23 LVOT Area:     3.14 cm  RIGHT VENTRICLE RV S prime:     11.30 cm/s TAPSE (M-mode): 1.9 cm LEFT ATRIUM           Index        RIGHT ATRIUM  Index LA diam:       3.20 cm 1.60 cm/m   RA Area:     16.20 cm LA Vol (A2C): 37.5 ml 18.79 ml/m  RA Volume:   42.50 ml  21.30 ml/m LA Vol (A4C): 32.5 ml 16.29 ml/m  AORTIC VALVE LVOT Vmax:   88.80 cm/s LVOT Vmean:  58.900 cm/s LVOT VTI:    0.144 m  AORTA Ao Root diam: 3.80 cm MITRAL VALVE MV Area (PHT): 1.65 cm    SHUNTS MV Decel Time: 461 msec    Systemic VTI:  0.14 m MV E velocity: 48.70 cm/s  Systemic Diam: 2.00 cm MV A velocity: 62.40 cm/s MV E/A ratio:  0.78 Rozann Lesches MD Electronically signed by Rozann Lesches MD Signature Date/Time: 05/27/2021/8:15:12 PM    Final       Scheduled Meds:  feeding supplement  1 Container Oral TID BM   insulin aspart  0-5 Units Subcutaneous QHS   insulin aspart  0-9 Units Subcutaneous TID WC   multivitamin with minerals  1 tablet Oral Daily   pantoprazole  40 mg Oral Daily   Continuous Infusions:  piperacillin-tazobactam (ZOSYN)  IV 3.375 g (05/28/21 0644)     LOS: 3 days      Time spent: 30 minutes   Dessa Phi, DO [facility] 05/28/2021, 11:45 AM   Available via Epic secure chat 7am-7pm After these hours, please refer to coverage provider listed on [email or web address]

## 2021-05-28 NOTE — Progress Notes (Signed)
Patient report called to Emmit Pomfret, RN at [facility]. [phone]

## 2021-05-28 NOTE — Progress Notes (Signed)
Patient arrived back from procedure. VSS and patient resting comfortably in bed.

## 2021-05-29 DIAGNOSIS — A419 Sepsis, unspecified organism: Secondary | ICD-10-CM | POA: Diagnosis not present

## 2021-05-29 LAB — CBC
HCT: 35.6 % — ABNORMAL LOW (ref 39.0–52.0)
Hemoglobin: 11.7 g/dL — ABNORMAL LOW (ref 13.0–17.0)
MCH: 29.3 pg (ref 26.0–34.0)
MCHC: 32.9 g/dL (ref 30.0–36.0)
MCV: 89 fL (ref 80.0–100.0)
Platelets: 278 10*3/uL (ref 150–400)
RBC: 4 MIL/uL — ABNORMAL LOW (ref 4.22–5.81)
RDW: 15.5 % (ref 11.5–15.5)
WBC: 6.3 10*3/uL (ref 4.0–10.5)
nRBC: 0 % (ref 0.0–0.2)

## 2021-05-29 LAB — COMPREHENSIVE METABOLIC PANEL
ALT: 88 U/L — ABNORMAL HIGH (ref 0–44)
AST: 85 U/L — ABNORMAL HIGH (ref 15–41)
Albumin: 2.3 g/dL — ABNORMAL LOW (ref 3.5–5.0)
Alkaline Phosphatase: 607 U/L — ABNORMAL HIGH (ref 38–126)
Anion gap: 7 (ref 5–15)
BUN: 15 mg/dL (ref 8–23)
CO2: 23 mmol/L (ref 22–32)
Calcium: 8.9 mg/dL (ref 8.9–10.3)
Chloride: 103 mmol/L (ref 98–111)
Creatinine, Ser: 0.63 mg/dL (ref 0.61–1.24)
GFR, Estimated: >60 mL/min (ref >60)
Glucose, Bld: 148 mg/dL — ABNORMAL HIGH (ref 70–99)
Potassium: 4.4 mmol/L (ref 3.5–5.1)
Sodium: 133 mmol/L — ABNORMAL LOW (ref 135–145)
Total Bilirubin: 5.4 mg/dL — ABNORMAL HIGH (ref 0.3–1.2)
Total Protein: 6.7 g/dL (ref 6.5–8.1)

## 2021-05-29 LAB — GLUCOSE, CAPILLARY: Glucose-Capillary: 121 mg/dL — ABNORMAL HIGH (ref 70–99)

## 2021-05-29 MED ORDER — METFORMIN HCL 500 MG PO TABS: 500.0000 mg | ORAL_TABLET | Freq: Two times a day (BID) | ORAL | 0 refills | Status: DC

## 2021-05-29 MED ORDER — PANTOPRAZOLE SODIUM 40 MG PO TBEC: 40.0000 mg | DELAYED_RELEASE_TABLET | Freq: Every day | ORAL | 2 refills | Status: DC

## 2021-05-29 MED ORDER — AMOXICILLIN-POT CLAVULANATE 875-125 MG PO TABS: 1.0000 | ORAL_TABLET | Freq: Two times a day (BID) | ORAL | 0 refills | Status: AC

## 2021-05-29 NOTE — Progress Notes (Signed)
Brief ID Note:  Patient s/p ERCP yesterday at [facility] with stent exchange.  CUrrently on zosyn for E faecalis bacteremia and was recommended by team at [facility] clinic to be on cipro suppression due to PsA bacteremia last month until his upcoming surgery.    Repeat blood cultures are NGTD and Echo was negative for vegetations.  Denova score is < 3 making endocarditis unlikely.  LFTs and T bili this morning are improved after procedure.   Recommend discharge home on continued ciprofloxacin as previously recommended by his team at [facility] clinic until his upcoming surgery.  Would also transition to Augmentin and continue this as well until his upcoming surgery due to high risk of recurrence.  Please call as needed.    Raynelle Highland for [facility]ious [facility] 05/29/2021, 9:16 AM

## 2021-05-29 NOTE — Discharge Summary (Addendum)
Physician Discharge Summary  Jonathan Keith YNW:[id number] DOB: 01/09/1956 DOA: 05/25/2021  PCP: [name], PA-C  Admit date: 05/25/2021 Discharge date: 05/29/2021  Admitted From: Home Disposition:  Home  Recommendations for Outpatient Follow-up:  Follow up with PCP in 1 week to obtain repeat LFT, and for diabetes Follow-up with [facility] for surgery as scheduled  Discharge Condition: Stable CODE STATUS: Full  Diet recommendation: Carb modified   Brief/Interim Summary: Jonathan Keith is 65 year old male with history of hypertension, diabetes mellitus type 2, cholangiocarcinoma status post biliary stenting and right portal vein embolization with surgical intervention planned as an outpatient in [facility] in [place], recent hospitalization from 04/28/2021-05/01/2021 for sepsis due to Pseudomonas bacteremia treated with IV cefepime followed by oral ciprofloxacin on discharge presented with fever.  On presentation, temperature was 100.5 along with tachycardia and WBC of 11.9; lactic acid of 2 and then 1.8 with sodium 129.  AST 91, ALT 109, ALP 716 and total bili of 4.6.  Abdominal CT with contrast showed no acute process; showed colonic diverticulosis without diverticulitis, stable biliary stents with scattered dilated intrahepatic ducts.  He was started on IV antibiotics.  GI was consulted.  Subsequently ID was also consulted for Enterococcus bacteremia.  Patient ultimately underwent ERCP at [facility] with stent exchange.  LFT showed improvement.  Discharge Diagnoses:  Principal Problem:   Sepsis ([place]) Active Problems:   Diabetes mellitus ([place])   Hypertension   Elevated LFTs   Cholangiocarcinoma ([place])   Acute febrile illness   Hyponatremia   Hyperbilirubinemia   Sepsis secondary to Enterococcus faecalis bacteremia -Sepsis present on admission with concern for obstruction with history of cholangiocarcinoma status post stenting and right portal vein embolization -Appreciate GI,  infectious disease -Status post ERCP at [facility] 10/19 -Repeat blood cultures remain negative  -Has plans for partial hepatectomy at [facility] 07/02/2021 -Recommend to continue Augmentin and Cipro until surgery next month due to high risk of recurrence   Diabetes mellitus type 2 -A1c 8.7 -Dose of metformin increased. Blood sugar has been well controlled during hospitalization. Recommend outpatient follow up    Elevated LFT -Improved overnight, follow-up with repeat lab work as outpatient     Discharge Instructions  Discharge Instructions     Call MD for:  difficulty breathing, headache or visual disturbances   Complete by: As directed    Call MD for:  extreme fatigue   Complete by: As directed    Call MD for:  hives   Complete by: As directed    Call MD for:  persistant dizziness or light-headedness   Complete by: As directed    Call MD for:  persistant nausea and vomiting   Complete by: As directed    Call MD for:  severe uncontrolled pain   Complete by: As directed    Call MD for:  temperature >100.4   Complete by: As directed    Diet Carb Modified   Complete by: As directed    Discharge instructions   Complete by: As directed    Take cipro and augmentin until follow up with [facility] for surgery.   Follow up with PCP for repeat liver function lab work and to follow up regarding blood sugar/diabetes.   You were cared for by a hospitalist during your hospital stay. If you have any questions about your discharge medications or the care you received while you were in the hospital after you are discharged, you can call the unit and ask to speak with  the hospitalist on call if the hospitalist that took care of you is not available. Once you are discharged, your primary care physician will handle any further medical issues. Please note that NO REFILLS for any discharge medications will be authorized once you are discharged, as it is imperative that you return to your primary care  physician (or establish a relationship with a primary care physician if you do not have one) for your aftercare needs so that they can reassess your need for medications and monitor your lab values.   Increase activity slowly   Complete by: As directed       Allergies as of 05/29/2021   No Known Allergies      Medication List     STOP taking these medications    doxycycline 100 MG tablet Commonly known as: ADOXA       TAKE these medications    amoxicillin-clavulanate 875-125 MG tablet Commonly known as: Augmentin Take 1 tablet by mouth 2 (two) times daily.   ciprofloxacin 750 MG tablet Commonly known as: CIPRO Take 1 tablet (750 mg total) by mouth 2 (two) times daily.   ferrous sulfate 325 (65 FE) MG tablet Commonly known as: FerrouSul Take 1 tablet (325 mg total) by mouth daily with breakfast.   metFORMIN 500 MG tablet Commonly known as: GLUCOPHAGE Take 1 tablet (500 mg total) by mouth 2 (two) times daily with a meal. What changed: when to take this   pantoprazole 40 MG tablet Commonly known as: Protonix Take 1 tablet (40 mg total) by mouth daily.   saccharomyces boulardii 250 MG capsule Commonly known as: Florastor Take 1 capsule (250 mg total) by mouth 2 (two) times daily.        Follow-up Information     [name], PA-C. Schedule an appointment as soon as possible for a visit in 1 week(s).   Specialties: Physician Assistant, Internal Medicine Why: Follow up for repeat lab work and diabetes Contact information: [place] [address] [phone]                No Known Allergies  Consultations: GI ID    Procedures/Studies: CT Abdomen Pelvis W Contrast  Result Date: 05/25/2021 CLINICAL DATA:  Abdominal abscess/infection suspected EXAM: CT ABDOMEN AND PELVIS WITH CONTRAST TECHNIQUE: Multidetector CT imaging of the abdomen and pelvis was performed using the standard protocol following bolus  administration of intravenous contrast. CONTRAST:  130mL OMNIPAQUE IOHEXOL 300 MG/ML  SOLN COMPARISON:  CT April 28, 2021 FINDINGS: Lower chest: No acute abnormality. Hepatobiliary: Streak artifact from multifocal hypertense metallic material in the right hepatic lobe obscures evaluation. Bilobar biliary stents appear appropriate in positioning. Scattered dilated intrahepatic bile ducts are again seen. Stable distension of the gallbladder without pericholecystic fluid or adjacent inflammatory change. Pancreas: No pancreatic ductal dilation or evidence of acute inflammation. Spleen: Within normal limits. Adrenals/Urinary Tract: Adrenal glands are unremarkable. Kidneys are normal, without renal calculi, solid enhancing lesion, or hydronephrosis. Bladder is unremarkable for degree of distension. Stomach/Bowel: Stomach is predominantly decompressed. No pathologic dilation of small or large bowel. The appendix and terminal ileum appear normal. Colonic diverticulosis without findings of acute diverticulitis. Vascular/Lymphatic: Aortic and branch vessel atherosclerosis without abdominal aortic aneurysm. No pathologically enlarged abdominal or pelvic lymph nodes. Reproductive: Prostate is unremarkable. Other: Small fat containing paraumbilical hernia. No abdominopelvic ascites. No walled off fluid collections. No pneumoperitoneum. Musculoskeletal: Multilevel degenerative changes spine. No acute osseous abnormality. IMPRESSION: 1. No new acute process in  the abdomen or pelvis identified. 2. Streak artifact from hyperdense metallic material in the right hepatic lobes obscures evaluation. Within this context there is no new intrahepatic focal process identified. Colonic diverticulosis without findings of acute diverticulitis. 3. Stable bilobar biliary stents with scattered dilated intrahepatic bile ducts. 4. Aortic Atherosclerosis (ICD10-I70.0). Electronically Signed   By: Dahlia Bailiff M.D.   On: 05/25/2021 15:23   DG  Chest Port 1 View  Result Date: 05/25/2021 CLINICAL DATA:  Questionable sepsis EXAM: PORTABLE CHEST 1 VIEW COMPARISON:  04/28/2021 FINDINGS: The heart size and mediastinal contours are within normal limits. Both lungs are clear. The visualized skeletal structures are unremarkable. IMPRESSION: No acute abnormality of the lungs in AP portable projection. Electronically Signed   By: Delanna Ahmadi M.D.   On: 05/25/2021 15:24   ECHOCARDIOGRAM COMPLETE  Result Date: 05/27/2021    ECHOCARDIOGRAM REPORT   Patient Name:   Jonathan Keith Date of Exam: 05/27/2021 Medical Rec #:  [id number]       Height:       71.0 in Accession #:    [id number]      Weight:       175.7 lb Date of Birth:  11-06-1955       BSA:          1.996 m Patient Age:    65 years        BP:           161/71 mmHg Patient Gender: M               HR:           83 bpm. Exam Location:  [facility] Procedure: 2D Echo, Cardiac Doppler and Color Doppler Indications:    Bacteremia R78.81  History:        Patient has no prior history of Echocardiogram examinations.                 Risk Factors:Hypertension, Diabetes, Dyslipidemia and Current                 Smoker.  Sonographer:    Alvino Chapel RCS Referring Phys: [id number] [place]  1. Left ventricular ejection fraction, by estimation, is 55 to 60%. The left ventricle has normal function. The left ventricle has no regional wall motion abnormalities. Left ventricular diastolic parameters were normal.  2. Right ventricular systolic function is normal. The right ventricular size is normal. Tricuspid regurgitation signal is inadequate for assessing PA pressure.  3. The mitral valve is grossly normal. Trivial mitral valve regurgitation.  4. The aortic valve is tricuspid. There is mild calcification of the aortic valve. Aortic valve regurgitation is not visualized.  5. The inferior vena cava is normal in size with greater than 50% respiratory variability, suggesting right atrial pressure of 3  mmHg.  6. No obvious valvular vegetations. Comparison(s): No prior Echocardiogram. FINDINGS  Left Ventricle: Left ventricular ejection fraction, by estimation, is 55 to 60%. The left ventricle has normal function. The left ventricle has no regional wall motion abnormalities. The left ventricular internal cavity size was normal in size. There is  no left ventricular hypertrophy. Left ventricular diastolic parameters were normal. Right Ventricle: The right ventricular size is normal. No increase in right ventricular wall thickness. Right ventricular systolic function is normal. Tricuspid regurgitation signal is inadequate for assessing PA pressure. Left Atrium: Left atrial size was normal in size. Right Atrium: Right atrial size was normal in size. Pericardium: There is no  evidence of pericardial effusion. Mitral Valve: The mitral valve is grossly normal. Trivial mitral valve regurgitation. Tricuspid Valve: The tricuspid valve is grossly normal. Tricuspid valve regurgitation is trivial. Aortic Valve: The aortic valve is tricuspid. There is mild calcification of the aortic valve. There is mild aortic valve annular calcification. Aortic valve regurgitation is not visualized. Pulmonic Valve: The pulmonic valve was grossly normal. Pulmonic valve regurgitation is trivial. Aorta: The aortic root is normal in size and structure. Venous: The inferior vena cava is normal in size with greater than 50% respiratory variability, suggesting right atrial pressure of 3 mmHg. IAS/Shunts: No atrial level shunt detected by color flow Doppler.  LEFT VENTRICLE PLAX 2D LVIDd:         3.90 cm   Diastology LVIDs:         2.70 cm   LV e' medial:    10.40 cm/s LV PW:         0.90 cm   LV E/e' medial:  4.7 LV IVS:        1.00 cm   LV e' lateral:   9.79 cm/s LVOT diam:     2.00 cm   LV E/e' lateral: 5.0 LV SV:         45 LV SV Index:   23 LVOT Area:     3.14 cm  RIGHT VENTRICLE RV S prime:     11.30 cm/s TAPSE (M-mode): 1.9 cm LEFT ATRIUM            Index        RIGHT ATRIUM           Index LA diam:      3.20 cm 1.60 cm/m   RA Area:     16.20 cm LA Vol (A2C): 37.5 ml 18.79 ml/m  RA Volume:   42.50 ml  21.30 ml/m LA Vol (A4C): 32.5 ml 16.29 ml/m  AORTIC VALVE LVOT Vmax:   88.80 cm/s LVOT Vmean:  58.900 cm/s LVOT VTI:    0.144 m  AORTA Ao Root diam: 3.80 cm MITRAL VALVE MV Area (PHT): 1.65 cm    SHUNTS MV Decel Time: 461 msec    Systemic VTI:  0.14 m MV E velocity: 48.70 cm/s  Systemic Diam: 2.00 cm MV A velocity: 62.40 cm/s MV E/A ratio:  0.78 Rozann Lesches MD Electronically signed by Rozann Lesches MD Signature Date/Time: 05/27/2021/8:15:12 PM    Final     ERCP at [facility] 10/19    Discharge Exam: Vitals:   05/28/21 2044 05/29/21 0440  BP: 135/83 (!) 141/85  Pulse: 79 67  Resp: 18 18  Temp: 97.9 F (36.6 C) (!) 97.5 F (36.4 C)  SpO2: 95% 98%    General: Pt is alert, awake, not in acute distress Cardiovascular: RRR, S1/S2 +, no edema Respiratory: CTA bilaterally, no wheezing, no rhonchi, no respiratory distress, no conversational dyspnea  Abdominal: Soft, NT, ND, bowel sounds + Extremities: no edema, no cyanosis Psych: Normal mood and affect, stable judgement and insight     The results of significant diagnostics from this hospitalization (including imaging, microbiology, ancillary and laboratory) are listed below for reference.     Microbiology: Recent Results (from the past 240 hour(s))  Blood Culture (routine x 2)     Status: None (Preliminary result)   Collection Time: 05/25/21  2:12 PM   Specimen: BLOOD LEFT FOREARM  Result Value Ref Range Status   Specimen Description   Final    BLOOD LEFT FOREARM BOTTLES DRAWN AEROBIC  AND ANAEROBIC   Special Requests Blood Culture adequate volume  Final   Culture   Final    NO GROWTH 4 DAYS Performed at [facility], [address] [place], [place] [address]    Report Status PENDING  Incomplete  Resp Panel by RT-PCR (Flu A&B, Covid) Nasopharyngeal Swab     Status: None    Collection Time: 05/25/21  2:25 PM   Specimen: Nasopharyngeal Swab; Nasopharyngeal(NP) swabs in vial transport medium  Result Value Ref Range Status   SARS Coronavirus 2 by RT PCR NEGATIVE NEGATIVE Final    Comment: (NOTE) SARS-CoV-2 target nucleic acids are NOT DETECTED.  The SARS-CoV-2 RNA is generally detectable in upper respiratory specimens during the acute phase of infection. The lowest concentration of SARS-CoV-2 viral copies this assay can detect is 138 copies/mL. A negative result does not preclude SARS-Cov-2 infection and should not be used as the sole basis for treatment or other patient management decisions. A negative result may occur with  improper specimen collection/handling, submission of specimen other than nasopharyngeal swab, presence of viral mutation(s) within the areas targeted by this assay, and inadequate number of viral copies(<138 copies/mL). A negative result must be combined with clinical observations, patient history, and epidemiological information. The expected result is Negative.  Fact Sheet for Patients:  EntrepreneurPulse.com.au  Fact Sheet for Healthcare Providers:  IncredibleEmployment.be  This test is no t yet approved or cleared by the [place] FDA and  has been authorized for detection and/or diagnosis of SARS-CoV-2 by FDA under an Emergency Use Authorization (EUA). This EUA will remain  in effect (meaning this test can be used) for the duration of the COVID-19 declaration under Section 564(b)(1) of the Act, 21 U.S.C.section 360bbb-3(b)(1), unless the authorization is terminated  or revoked sooner.       Influenza A by PCR NEGATIVE NEGATIVE Final   Influenza B by PCR NEGATIVE NEGATIVE Final    Comment: (NOTE) The Xpert Xpress SARS-CoV-2/FLU/RSV plus assay is intended as an aid in the diagnosis of influenza from Nasopharyngeal swab specimens and should not be used as a sole basis for treatment.  Nasal washings and aspirates are unacceptable for Xpert Xpress SARS-CoV-2/FLU/RSV testing.  Fact Sheet for Patients: EntrepreneurPulse.com.au  Fact Sheet for Healthcare Providers: IncredibleEmployment.be  This test is not yet approved or cleared by the [place] FDA and has been authorized for detection and/or diagnosis of SARS-CoV-2 by FDA under an Emergency Use Authorization (EUA). This EUA will remain in effect (meaning this test can be used) for the duration of the COVID-19 declaration under Section 564(b)(1) of the Act, 21 U.S.C. section 360bbb-3(b)(1), unless the authorization is terminated or revoked.  Performed at [facility], [address] [place], [place] [address]   Urine Culture     Status: None   Collection Time: 05/25/21  2:25 PM   Specimen: Urine, Clean Catch  Result Value Ref Range Status   Specimen Description   Final    URINE, CLEAN CATCH Performed at [facility], [address], [place], [place] [address]    Special Requests   Final    NONE Performed at [facility], [address], [place],  [address]    Culture   Final    NO GROWTH Performed at [facility], [place] [address] [place],  [address]    Report Status 05/26/2021 FINAL  Final  Blood Culture (routine x 2)     Status: Abnormal   Collection Time: 05/25/21  2:39 PM  Specimen: Left Antecubital; Blood  Result Value Ref Range Status   Specimen Description   Final    LEFT ANTECUBITAL BOTTLES DRAWN AEROBIC AND ANAEROBIC Performed at [facility], [address], [place], [place] [address]    Special Requests   Final    Blood Culture adequate volume Performed at [facility], [address], [place], [place] [address]    Culture  Setup Time   Final    GRAM POSITIVE COCCI IN BOTH AEROBIC AND ANAEROBIC BOTTLES Gram Stain Report Called to,Read Back By and Verified With: BLACKWELL,S AT 9622 ON 10.17.22 BY RUCINSKI,B CRITICAL RESULT CALLED  TO, READ BACK BY AND VERIFIED WITHPhilip Aspen PHARMD 1444 05/26/21 A BROWNING Performed at [facility], [place] [address] [place], [place] [address]    Culture ENTEROCOCCUS FAECALIS (A)  Final   Report Status 05/28/2021 FINAL  Final   Organism ID, Bacteria ENTEROCOCCUS FAECALIS  Final      Susceptibility   Enterococcus faecalis - MIC*    AMPICILLIN <=2 SENSITIVE Sensitive     VANCOMYCIN 1 SENSITIVE Sensitive     GENTAMICIN SYNERGY SENSITIVE Sensitive     * ENTEROCOCCUS FAECALIS  Blood Culture ID Panel (Reflexed)     Status: Abnormal   Collection Time: 05/25/21  2:39 PM  Result Value Ref Range Status   Enterococcus faecalis DETECTED (A) NOT DETECTED Final    Comment: CRITICAL RESULT CALLED TO, READ BACK BY AND VERIFIED WITH: Philip Aspen PHARMD 1444 05/26/21 A BROWNING    Enterococcus Faecium NOT DETECTED NOT DETECTED Final   Listeria monocytogenes NOT DETECTED NOT DETECTED Final   Staphylococcus species NOT DETECTED NOT DETECTED Final   Staphylococcus aureus (BCID) NOT DETECTED NOT DETECTED Final   Staphylococcus epidermidis NOT DETECTED NOT DETECTED Final   Staphylococcus lugdunensis NOT DETECTED NOT DETECTED Final   Streptococcus species NOT DETECTED NOT DETECTED Final   Streptococcus agalactiae NOT DETECTED NOT DETECTED Final   Streptococcus pneumoniae NOT DETECTED NOT DETECTED Final   Streptococcus pyogenes NOT DETECTED NOT DETECTED Final   A.calcoaceticus-baumannii NOT DETECTED NOT DETECTED Final   Bacteroides fragilis NOT DETECTED NOT DETECTED Final   Enterobacterales NOT DETECTED NOT DETECTED Final   Enterobacter cloacae complex NOT DETECTED NOT DETECTED Final   Escherichia coli NOT DETECTED NOT DETECTED Final   Klebsiella aerogenes NOT DETECTED NOT DETECTED Final   Klebsiella oxytoca NOT DETECTED NOT DETECTED Final   Klebsiella pneumoniae NOT DETECTED NOT DETECTED Final   Proteus species NOT DETECTED NOT DETECTED Final   Salmonella species NOT DETECTED NOT DETECTED Final    Serratia marcescens NOT DETECTED NOT DETECTED Final   Haemophilus influenzae NOT DETECTED NOT DETECTED Final   Neisseria meningitidis NOT DETECTED NOT DETECTED Final   Pseudomonas aeruginosa NOT DETECTED NOT DETECTED Final   Stenotrophomonas maltophilia NOT DETECTED NOT DETECTED Final   Candida albicans NOT DETECTED NOT DETECTED Final   Candida auris NOT DETECTED NOT DETECTED Final   Candida glabrata NOT DETECTED NOT DETECTED Final   Candida krusei NOT DETECTED NOT DETECTED Final   Candida parapsilosis NOT DETECTED NOT DETECTED Final   Candida tropicalis NOT DETECTED NOT DETECTED Final   Cryptococcus neoformans/gattii NOT DETECTED NOT DETECTED Final   Vancomycin resistance NOT DETECTED NOT DETECTED Final    Comment: Performed at [facility], [place]. [address] [place],  [address]  Culture, blood (routine x 2)     Status: None (Preliminary result)   Collection Time: 05/27/21  4:32 AM   Specimen: BLOOD  Result Value Ref Range Status   Specimen Description BLOOD RIGHT ANTECUBITAL  Final   Special Requests   Final    BOTTLES DRAWN AEROBIC AND ANAEROBIC Blood Culture results may not be optimal due to an excessive volume of blood received in culture bottles   Culture   Final    NO GROWTH 2 DAYS Performed at [facility], [address] [place], [place] [address]    Report Status PENDING  Incomplete  Culture, blood (routine x 2)     Status: None (Preliminary result)   Collection Time: 05/27/21  4:36 AM   Specimen: BLOOD  Result Value Ref Range Status   Specimen Description BLOOD BLOOD RIGHT HAND  Final   Special Requests   Final    BOTTLES DRAWN AEROBIC AND ANAEROBIC Blood Culture results may not be optimal due to an excessive volume of blood received in culture bottles   Culture   Final    NO GROWTH 2 DAYS Performed at [facility], [address], [place], [place] [address]    Report Status PENDING  Incomplete     Labs: BNP (last 3 results) No results for input(s):  BNP in the last 8760 hours. Basic Metabolic Panel: Recent Labs  Lab 05/25/21 1412 05/26/21 0444 05/27/21 0432 05/28/21 0427 05/29/21 0518  NA 129* 133* 133* 131* 133*  K 4.1 3.7 3.7 3.7 4.4  CL 99 102 103 101 103  CO2 22 27 24 24 23   GLUCOSE 201* 154* 103* 110* 148*  BUN 12 9 11 10 15   CREATININE 0.87 0.65 0.68 0.65 0.63  CALCIUM 8.7* 8.4* 8.4* 8.6* 8.9  MG  --   --   --  2.1  --    Liver Function Tests: Recent Labs  Lab 05/25/21 1412 05/26/21 0444 05/27/21 0432 05/28/21 0427 05/29/21 0518  AST 91* 55* 65* 103* 85*  ALT 109* 74* 68* 85* 88*  ALKPHOS 716* 525* 476* 524* 607*  BILITOT 4.6* 6.2* 6.4* 6.7* 5.4*  PROT 7.4 6.3* 6.4* 6.5 6.7  ALBUMIN 3.0* 2.4* 2.4* 2.3* 2.3*   No results for input(s): LIPASE, AMYLASE in the last 168 hours. No results for input(s): AMMONIA in the last 168 hours. CBC: Recent Labs  Lab 05/25/21 1412 05/26/21 0444 05/27/21 0432 05/28/21 0427 05/29/21 0518  WBC 11.9* 10.0 7.2 7.3 6.3  NEUTROABS 10.2*  --  4.7 4.4  --   HGB 13.0 11.2* 11.3* 11.2* 11.7*  HCT 38.2* 33.9* 34.1* 33.4* 35.6*  MCV 89.5 91.4 91.4 88.8 89.0  PLT 259 180 203 217 278   Cardiac Enzymes: No results for input(s): CKTOTAL, CKMB, CKMBINDEX, TROPONINI in the last 168 hours. BNP: Invalid input(s): POCBNP CBG: Recent Labs  Lab 05/27/21 2213 05/28/21 0909 05/28/21 1614 05/28/21 2047 05/29/21 0807  GLUCAP 93 117* 126* 250* 121*   D-Dimer No results for input(s): DDIMER in the last 72 hours. Hgb A1c No results for input(s): HGBA1C in the last 72 hours. Lipid Profile No results for input(s): CHOL, HDL, LDLCALC, TRIG, CHOLHDL, LDLDIRECT in the last 72 hours. Thyroid function studies No results for input(s): TSH, T4TOTAL, T3FREE, THYROIDAB in the last 72 hours.  Invalid input(s): FREET3 Anemia work up No results for input(s): VITAMINB12, FOLATE, FERRITIN, TIBC, IRON, RETICCTPCT in the last 72 hours. Urinalysis    Component Value Date/Time   COLORURINE AMBER  (A) 05/25/2021 1412   APPEARANCEUR HAZY (A) 05/25/2021 1412   LABSPEC 1.033 (H) 05/25/2021 1412   PHURINE 5.0 05/25/2021 1412   GLUCOSEU  NEGATIVE 05/25/2021 [place] 05/25/2021 1412   [place] (A) 05/25/2021 1412   KETONESUR 5 (A) 05/25/2021 1412   PROTEINUR 100 (A) 05/25/2021 1412   NITRITE NEGATIVE 05/25/2021 1412   LEUKOCYTESUR NEGATIVE 05/25/2021 1412   Sepsis Labs Invalid input(s): PROCALCITONIN,  WBC,  LACTICIDVEN Microbiology Recent Results (from the past 240 hour(s))  Blood Culture (routine x 2)     Status: None (Preliminary result)   Collection Time: 05/25/21  2:12 PM   Specimen: BLOOD LEFT FOREARM  Result Value Ref Range Status   Specimen Description   Final    BLOOD LEFT FOREARM BOTTLES DRAWN AEROBIC AND ANAEROBIC   Special Requests Blood Culture adequate volume  Final   Culture   Final    NO GROWTH 4 DAYS Performed at [facility], [address] [place], [place] [address]    Report Status PENDING  Incomplete  Resp Panel by RT-PCR (Flu A&B, Covid) Nasopharyngeal Swab     Status: None   Collection Time: 05/25/21  2:25 PM   Specimen: Nasopharyngeal Swab; Nasopharyngeal(NP) swabs in vial transport medium  Result Value Ref Range Status   SARS Coronavirus 2 by RT PCR NEGATIVE NEGATIVE Final    Comment: (NOTE) SARS-CoV-2 target nucleic acids are NOT DETECTED.  The SARS-CoV-2 RNA is generally detectable in upper respiratory specimens during the acute phase of infection. The lowest concentration of SARS-CoV-2 viral copies this assay can detect is 138 copies/mL. A negative result does not preclude SARS-Cov-2 infection and should not be used as the sole basis for treatment or other patient management decisions. A negative result may occur with  improper specimen collection/handling, submission of specimen other than nasopharyngeal swab, presence of viral mutation(s) within the areas targeted by this assay, and inadequate number of  viral copies(<138 copies/mL). A negative result must be combined with clinical observations, patient history, and epidemiological information. The expected result is Negative.  Fact Sheet for Patients:  EntrepreneurPulse.com.au  Fact Sheet for Healthcare Providers:  IncredibleEmployment.be  This test is no t yet approved or cleared by the [place] FDA and  has been authorized for detection and/or diagnosis of SARS-CoV-2 by FDA under an Emergency Use Authorization (EUA). This EUA will remain  in effect (meaning this test can be used) for the duration of the COVID-19 declaration under Section 564(b)(1) of the Act, 21 U.S.C.section 360bbb-3(b)(1), unless the authorization is terminated  or revoked sooner.       Influenza A by PCR NEGATIVE NEGATIVE Final   Influenza B by PCR NEGATIVE NEGATIVE Final    Comment: (NOTE) The Xpert Xpress SARS-CoV-2/FLU/RSV plus assay is intended as an aid in the diagnosis of influenza from Nasopharyngeal swab specimens and should not be used as a sole basis for treatment. Nasal washings and aspirates are unacceptable for Xpert Xpress SARS-CoV-2/FLU/RSV testing.  Fact Sheet for Patients: EntrepreneurPulse.com.au  Fact Sheet for Healthcare Providers: IncredibleEmployment.be  This test is not yet approved or cleared by the [place] FDA and has been authorized for detection and/or diagnosis of SARS-CoV-2 by FDA under an Emergency Use Authorization (EUA). This EUA will remain in effect (meaning this test can be used) for the duration of the COVID-19 declaration under Section 564(b)(1) of the Act, 21 U.S.C. section 360bbb-3(b)(1), unless the authorization is terminated or revoked.  Performed at [facility], [address], [place], [place] [address]   Urine Culture     Status: None   Collection Time: 05/25/21  2:25 PM   Specimen: Urine,  Clean Catch  Result Value Ref  Range Status   Specimen Description   Final    URINE, CLEAN CATCH Performed at [facility], [address], [place], [place] [address]    Special Requests   Final    NONE Performed at [facility], [address] [place], [place] [address]    Culture   Final    NO GROWTH Performed at [facility], [place] [address], [place], [place] [address]    Report Status 05/26/2021 FINAL  Final  Blood Culture (routine x 2)     Status: Abnormal   Collection Time: 05/25/21  2:39 PM   Specimen: Left Antecubital; Blood  Result Value Ref Range Status   Specimen Description   Final    LEFT ANTECUBITAL BOTTLES DRAWN AEROBIC AND ANAEROBIC Performed at [facility], [address], [place], [place] [address]    Special Requests   Final    Blood Culture adequate volume Performed at [facility], [address] [place], [place] [address]    Culture  Setup Time   Final    GRAM POSITIVE COCCI IN BOTH AEROBIC AND ANAEROBIC BOTTLES Gram Stain Report Called to,Read Back By and Verified With: BLACKWELL,S AT 3149 ON 10.17.22 BY RUCINSKI,B CRITICAL RESULT CALLED TO, READ BACK BY AND VERIFIED WITH: [place] 7026 05/26/21 A BROWNING Performed at [facility], [place] [address], [place], [place] [address]    Culture ENTEROCOCCUS FAECALIS (A)  Final   Report Status 05/28/2021 FINAL  Final   Organism ID, Bacteria ENTEROCOCCUS FAECALIS  Final      Susceptibility   Enterococcus faecalis - MIC*    AMPICILLIN <=2 SENSITIVE Sensitive     VANCOMYCIN 1 SENSITIVE Sensitive     GENTAMICIN SYNERGY SENSITIVE Sensitive     * ENTEROCOCCUS FAECALIS  Blood Culture ID Panel (Reflexed)     Status: Abnormal   Collection Time: 05/25/21  2:39 PM  Result Value Ref Range Status   Enterococcus faecalis DETECTED (A) NOT DETECTED Final    Comment: CRITICAL RESULT CALLED TO, READ BACK BY AND VERIFIED WITH: Philip Aspen PHARMD [id number] 05/26/21 A BROWNING    Enterococcus Faecium NOT DETECTED NOT DETECTED Final   Listeria  monocytogenes NOT DETECTED NOT DETECTED Final   Staphylococcus species NOT DETECTED NOT DETECTED Final   Staphylococcus aureus (BCID) NOT DETECTED NOT DETECTED Final   Staphylococcus epidermidis NOT DETECTED NOT DETECTED Final   Staphylococcus lugdunensis NOT DETECTED NOT DETECTED Final   Streptococcus species NOT DETECTED NOT DETECTED Final   Streptococcus agalactiae NOT DETECTED NOT DETECTED Final   Streptococcus pneumoniae NOT DETECTED NOT DETECTED Final   Streptococcus pyogenes NOT DETECTED NOT DETECTED Final   A.calcoaceticus-baumannii NOT DETECTED NOT DETECTED Final   Bacteroides fragilis NOT DETECTED NOT DETECTED Final   Enterobacterales NOT DETECTED NOT DETECTED Final   Enterobacter cloacae complex NOT DETECTED NOT DETECTED Final   Escherichia coli NOT DETECTED NOT DETECTED Final   Klebsiella aerogenes NOT DETECTED NOT DETECTED Final   Klebsiella oxytoca NOT DETECTED NOT DETECTED Final   Klebsiella pneumoniae NOT DETECTED NOT DETECTED Final   Proteus species NOT DETECTED NOT DETECTED Final   Salmonella species NOT DETECTED NOT DETECTED Final   Serratia marcescens NOT DETECTED NOT DETECTED Final   Haemophilus influenzae NOT DETECTED NOT DETECTED Final   Neisseria meningitidis NOT DETECTED NOT DETECTED Final   Pseudomonas aeruginosa NOT DETECTED NOT DETECTED Final   Stenotrophomonas maltophilia NOT DETECTED NOT DETECTED Final   Candida albicans  NOT DETECTED NOT DETECTED Final   Candida auris NOT DETECTED NOT DETECTED Final   Candida glabrata NOT DETECTED NOT DETECTED Final   Candida krusei NOT DETECTED NOT DETECTED Final   Candida parapsilosis NOT DETECTED NOT DETECTED Final   Candida tropicalis NOT DETECTED NOT DETECTED Final   Cryptococcus neoformans/gattii NOT DETECTED NOT DETECTED Final   Vancomycin resistance NOT DETECTED NOT DETECTED Final    Comment: Performed at [facility] [address], [place], [place] [address]  Culture, blood (routine x 2)     Status: None  (Preliminary result)   Collection Time: 05/27/21  4:32 AM   Specimen: BLOOD  Result Value Ref Range Status   Specimen Description BLOOD RIGHT ANTECUBITAL  Final   Special Requests   Final    BOTTLES DRAWN AEROBIC AND ANAEROBIC Blood Culture results may not be optimal due to an excessive volume of blood received in culture bottles   Culture   Final    NO GROWTH 2 DAYS Performed at [facility], [address] [place], [place] [address]    Report Status PENDING  Incomplete  Culture, blood (routine x 2)     Status: None (Preliminary result)   Collection Time: 05/27/21  4:36 AM   Specimen: BLOOD  Result Value Ref Range Status   Specimen Description BLOOD BLOOD RIGHT HAND  Final   Special Requests   Final    BOTTLES DRAWN AEROBIC AND ANAEROBIC Blood Culture results may not be optimal due to an excessive volume of blood received in culture bottles   Culture   Final    NO GROWTH 2 DAYS Performed at [facility], [address], [place], [place] [address]    Report Status PENDING  Incomplete     Patient was seen and examined on the day of discharge and was found to be in stable condition. Time coordinating discharge: 25 minutes including assessment and coordination of care, as well as examination of the patient.   SIGNED:  Dessa Phi, DO [facility] 05/29/2021, 10:31 AM

## 2021-05-29 NOTE — Progress Notes (Signed)
Patient being D/C'd. Appreciate ID input for ongoing outpatient discharge. Patient aware to reach out to providers at [facility] after discharge and make aware of change in meds. He will need updated LFTs in one week. I spoke to Dr. Laural Golden who will make arrangements for next week at [place]. Patient aware.   Laureen Ochs. Bernarda Caffey [facility] [phone] 10/20/202212:00 PM

## 2021-05-30 LAB — CULTURE, BLOOD (ROUTINE X 2)
Culture: NO GROWTH
Special Requests: ADEQUATE

## 2021-06-01 LAB — CULTURE, BLOOD (ROUTINE X 2)
Culture: NO GROWTH
Culture: NO GROWTH

## 2021-06-02 ENCOUNTER — Other Ambulatory Visit (INDEPENDENT_AMBULATORY_CARE_PROVIDER_SITE_OTHER): Payer: Self-pay | Admitting: *Deleted

## 2021-06-02 ENCOUNTER — Telehealth (INDEPENDENT_AMBULATORY_CARE_PROVIDER_SITE_OTHER): Payer: Self-pay | Admitting: *Deleted

## 2021-06-02 DIAGNOSIS — R7989 Other specified abnormal findings of blood chemistry: Secondary | ICD-10-CM

## 2021-06-02 NOTE — Telephone Encounter (Signed)
Received voicemail from [place] that pt was in office with no lab order. Asked for it to be faxed to [phone]. No order was put in so I looked through notes and saw message I copied below from [place] on 05/29/21.  He will need updated LFTs in one week. I spoke to Dr. Laural Golden who will make arrangements for next week at [place]. Patient aware.  I put in order for LFT and faxed to [facility]. I tried to call lab to let them know and no answer.

## 2021-06-04 NOTE — Telephone Encounter (Signed)
error 

## 2021-06-05 LAB — HEPATIC FUNCTION PANEL
ALT: 101 IU/L — ABNORMAL HIGH (ref 0–44)
AST: 66 IU/L — ABNORMAL HIGH (ref 0–40)
Albumin: 3.7 g/dL — ABNORMAL LOW (ref 3.8–4.8)
Alkaline Phosphatase: 764 IU/L — ABNORMAL HIGH (ref 44–121)
Bilirubin Total: 3.3 mg/dL — ABNORMAL HIGH (ref 0.0–1.2)
Bilirubin, Direct: 2.29 mg/dL — ABNORMAL HIGH (ref 0.00–0.40)
Total Protein: 6.9 g/dL (ref 6.0–8.5)

## 2021-06-09 ENCOUNTER — Telehealth (INDEPENDENT_AMBULATORY_CARE_PROVIDER_SITE_OTHER): Payer: Self-pay | Admitting: *Deleted

## 2021-06-09 NOTE — Telephone Encounter (Signed)
Wife called and wanted to see if dr Laural Golden had heard back from [facility] - dr Ronelle Nigh to see if he needed bw.  Also states she stopped giving protonix because it seemed to make his stomach hurt worse. He takes it around 9 am int he mornings 2 hours after taking his other meds and eating breaksfast. States he has not tried anything else for reflux. States he is still losing weight and he is eating well. Weight on 10/31 was 165. Last weight I could find in epic was 03/07/21 191lbs. States he eats a bowl of cereal for breaksfast then a few hours later he will eat an egg sandwich, then sandwich for lunch then a snack and ice cream, then dinner and another ice cream after dinner.   [facility] [address]  [phone] - pt's wife Thayer Headings.

## 2021-06-11 ENCOUNTER — Other Ambulatory Visit (INDEPENDENT_AMBULATORY_CARE_PROVIDER_SITE_OTHER): Payer: Self-pay | Admitting: *Deleted

## 2021-06-11 NOTE — Telephone Encounter (Signed)
Per dr Laural Golden - stop protonix and may take pepcid otc one bid prn. Has not heard back from [facility] clinic about bw but order LFT's. I called and discussed with pt's wife. She states he is having right side pain off and on and sweats at night off and on. He has been this way. Wanted to know if he also needed bw to see if infection is cleared up. Was prescribed cipro 750 mg bid and augmentin bid in hospital ( discharged 10/20) and was told to take til surgery. Will run out of both meds in 6 days. Asking for refills. Uses [facility]. Next visit with [facility] is nov 21st and surgery is set for nov 23rd.

## 2021-06-12 ENCOUNTER — Ambulatory Visit (INDEPENDENT_AMBULATORY_CARE_PROVIDER_SITE_OTHER): Payer: Medicare Other | Admitting: Gastroenterology

## 2021-06-12 ENCOUNTER — Encounter (INDEPENDENT_AMBULATORY_CARE_PROVIDER_SITE_OTHER): Payer: Self-pay | Admitting: Gastroenterology

## 2021-06-12 ENCOUNTER — Other Ambulatory Visit: Payer: Self-pay

## 2021-06-12 VITALS — BP 131/83 | HR 112 | Temp 98.1°F | Ht 70.0 in | Wt 170.0 lb

## 2021-06-12 DIAGNOSIS — R1011 Right upper quadrant pain: Secondary | ICD-10-CM | POA: Diagnosis not present

## 2021-06-12 DIAGNOSIS — G8929 Other chronic pain: Secondary | ICD-10-CM

## 2021-06-12 DIAGNOSIS — C249 Malignant neoplasm of biliary tract, unspecified: Secondary | ICD-10-CM | POA: Diagnosis not present

## 2021-06-12 DIAGNOSIS — C221 Intrahepatic bile duct carcinoma: Secondary | ICD-10-CM

## 2021-06-12 MED ORDER — CIPROFLOXACIN HCL 750 MG PO TABS: 750.0000 mg | ORAL_TABLET | Freq: Two times a day (BID) | ORAL | 0 refills | Status: AC

## 2021-06-12 MED ORDER — HYOSCYAMINE SULFATE 0.125 MG PO TABS: 0.1250 mg | ORAL_TABLET | Freq: Four times a day (QID) | ORAL | 0 refills | Status: DC | PRN

## 2021-06-12 NOTE — Progress Notes (Signed)
Maylon Peppers, M.D. Gastroenterology & Hepatology [facility] [address] [place], [place] [address]  Primary Care Physician: [name], [place] [address]  I will communicate my assessment and recommendations to the referring MD via EMR.  Problems: Cholangiocarcinoma status post biliary stenting and portal vein embolization with surgical intervention planned as an outpatient in [facility] in [place] in November 2423, complicated by recurrent Pseudomonas bacteremia and Enterococcus bacteremia  History of Present Illness: Jonathan Keith is 65 year old male with a complicated history of hypertension, diabetes mellitus type 2, cholangiocarcinoma status post biliary stenting and portal vein embolization with surgical intervention planned as an outpatient in [facility] in [place] in November 5361, complicated by recurrent Pseudomonas bacteremia and Enterococcus bacteremia requiring IV antibiotic and recent exchange of the stents at [facility] on 05/27/2021, who comes for follow-up after recent hospitalization.  The patient was last seen on the patient was recently hospitalized on 05/25/2021 after presenting recurrent fevers presenting abdominal pain.  He stopped taking his antibiotics as requested by the [facility] staff.  The patient presented with findings concerning for cholangitis as he had elevated white blood cell count of 11.9 and lactic acid of 2 with elevation of his liver enzymes with AST of 91, ALT of 109, alkaline phosphatase of 716 and total bilirubin of 4.6.  CT of the abdomen and pelvis with IV contrast did not show any acute abnormalities or any new intraparenchymal alterations with stable dilated intrahepatic biliary ducts.  He was started on IV Zosyn and he was transferred for an ERCP by Dr. Gayleen Orem at [facility] where he underwent exchange of his biliary stents and was  transferred back to [facility].  He was subsequently discharged on Augmentin and ciprofloxacin 750 mg every day.  The patient has presented persistent pain in his right flank after he underwent the portal vein embolization.  Both his wife and the patient report that he started having the symptoms after he underwent this procedure.  States that the pain fluctuates during the day but some days he gets very severe, especially during the night.  He is not taking too many medications but sometimes he takes 1 extra strength Tylenol when the pain is very severe and causes significant diaphoresis.  He may take 1 pill every week as he does not want his liver to get affected by the medication.  He denies having any other concomitant symptoms such as nausea, vomiting, fever, chills, hematochezia, melena, hematemesis, diarrhea, jaundice, pruritus.  Notably, he has been losing weight progressively for the last few months in though he is eating as usual.  He did not tolerate taking protein shakes in the past.  Past Medical History: Past Medical History:  Diagnosis Date   Chronic pain    neck, back, knees   Class 1 obesity due to excess calories with body mass index (BMI) of 31.0 to 31.9 in adult    Claudication [facility])    DDD (degenerative disc disease), cervical    Diabetes mellitus ([place])    Dyslipidemia    Hypertension    Tobacco dependence     Past Surgical History: Past Surgical History:  Procedure Laterality Date   BILIARY BRUSHING N/A 02/25/2021   Procedure: BILIARY BRUSHING;  Surgeon: Rogene Houston, MD;  Location: [facility];  Service: Gastroenterology;  Laterality: N/A;   BILIARY STENT PLACEMENT N/A 02/25/2021   Procedure: BILIARY STENT PLACEMENT;  Surgeon: Rogene Houston, MD;  Location: [facility];  Service: Gastroenterology;  Laterality: N/A;   BILIARY STENT PLACEMENT  02/27/2021   Procedure: BILIARY STENT PLACEMENT (10FR x 9cm) IN THE RIGHT SYSTEM;  Surgeon: Rogene Houston, MD;  Location:  [facility];  Service: Gastroenterology;;   BREAST SURGERY Left    benign lump- in his 6s.   ERCP N/A 02/25/2021   Procedure: ENDOSCOPIC RETROGRADE CHOLANGIOPANCREATOGRAPHY (ERCP);  Surgeon: Rogene Houston, MD;  Location: [facility];  Service: Gastroenterology;  Laterality: N/A;   ERCP N/A 02/27/2021   Procedure: ENDOSCOPIC RETROGRADE CHOLANGIOPANCREATOGRAPHY (ERCP);  Surgeon: Rogene Houston, MD;  Location: [facility];  Service: Gastroenterology;  Laterality: N/A;   KNEE SURGERY Left    x2   SPHINCTEROTOMY N/A 02/25/2021   Procedure: SPHINCTEROTOMY;  Surgeon: Rogene Houston, MD;  Location: [facility];  Service: Gastroenterology;  Laterality: N/A;   STENT REMOVAL  02/27/2021   Procedure: STENT REMOVAL (8.5Fr x 9cm);  Surgeon: Rogene Houston, MD;  Location: [facility];  Service: Gastroenterology;;    Family History: Family History  Problem Relation Age of Onset   CAD Mother    Cancer Neg Hx     Social History: Social History   Tobacco Use  Smoking Status Every Day   Packs/day: 0.50   Years: 56.00   Pack years: 28.00   Types: Cigarettes  Smokeless Tobacco Never   Social History   Substance and Sexual Activity  Alcohol Use Not Currently   Comment: stopped 2.5 years ago 03/11/21   Social History   Substance and Sexual Activity  Drug Use Never    Allergies: No Known Allergies  Medications: Current Outpatient Medications  Medication Sig Dispense Refill   acetaminophen (TYLENOL) 500 MG tablet Take 500 mg by mouth every 6 (six) hours as needed.     amoxicillin-clavulanate (AUGMENTIN) 875-125 MG tablet Take 1 tablet by mouth 2 (two) times daily. 68 tablet 0   famotidine (PEPCID) 20 MG tablet Take 20 mg by mouth 2 (two) times daily.     ferrous sulfate (FERROUSUL) 325 (65 FE) MG tablet Take 1 tablet (325 mg total) by mouth daily with breakfast.     metFORMIN (GLUCOPHAGE) 500 MG tablet Take 1 tablet (500 mg total) by mouth 2 (two) times daily with a meal. 60 tablet 0   pantoprazole  (PROTONIX) 40 MG tablet Take 1 tablet (40 mg total) by mouth daily. (Patient not taking: Reported on 06/12/2021) 30 tablet 2   saccharomyces boulardii (FLORASTOR) 250 MG capsule Take 1 capsule (250 mg total) by mouth 2 (two) times daily. (Patient not taking: No sig reported) 60 capsule 1   No current facility-administered medications for this visit.    Review of Systems: GENERAL: negative for malaise, night sweats HEENT: No changes in hearing or vision, no nose bleeds or other nasal problems. NECK: Negative for lumps, goiter, pain and significant neck swelling RESPIRATORY: Negative for cough, wheezing CARDIOVASCULAR: Negative for chest pain, leg swelling, palpitations, orthopnea GI: SEE HPI MUSCULOSKELETAL: Negative for joint pain or swelling, back pain, and muscle pain. SKIN: Negative for lesions, rash PSYCH: Negative for sleep disturbance, mood disorder and recent psychosocial stressors. HEMATOLOGY Negative for prolonged bleeding, bruising easily, and swollen nodes. ENDOCRINE: Negative for cold or heat intolerance, polyuria, polydipsia and goiter. NEURO: negative for tremor, gait imbalance, syncope and seizures. The remainder of the review of systems is noncontributory.   Physical Exam: BP 131/83 (BP Location: Left Arm, Patient Position: Sitting, Cuff Size: Small)  Pulse (!) 112   Temp 98.1 F (36.7 C) (Oral)   Ht 5\' 10"  (1.778 m)   Wt 170 lb (77.1 kg)   BMI 24.39 kg/m  GENERAL: The patient is AO x3, in no acute distress. HEENT: Head is normocephalic and atraumatic. EOMI are intact. Mouth is well hydrated and without lesions. NECK: Supple. No masses LUNGS: Clear to auscultation. No presence of rhonchi/wheezing/rales. Adequate chest expansion HEART: RRR, normal s1 and s2. ABDOMEN: tender upon palpation of the right flank, no guarding, no peritoneal signs, and nondistended. BS +. No masses. EXTREMITIES: Without any cyanosis, clubbing, rash, lesions or edema. NEUROLOGIC: AOx3, no  focal motor deficit. SKIN: no jaundice, no rashes  Imaging/Labs: as above  I personally reviewed and interpreted the available labs, imaging and endoscopic files.  Impression and Plan: Jonathan Keith is 65 year old male with a complicated history of hypertension, diabetes mellitus type 2, cholangiocarcinoma status post biliary stenting and portal vein embolization with surgical intervention planned as an outpatient in [facility] in [place] in November 3202, complicated by recurrent Pseudomonas bacteremia and Enterococcus bacteremia requiring IV antibiotic and recent exchange of the stents at [facility] on 05/27/2021, who comes for follow-up after recent hospitalization.  The patient had presence of recurrent cholangitis in the setting of recurrent obstruction due to his known biliary malignancy.  He fortunately underwent repeat ERCP at [facility], his stents were changed and his liver enzymes improved afterwards.  He responded to the treatment with antibiotics, and is currently on continues oral antibiotics until he undergoes the right hepatectomy at [facility].  I agree that he should continue with these medications until he is seen for his surgical procedure.  His most recent LFTs performed on the 26th showed improvement of his total bilirubin but mild increase of his alkaline phosphatase.  I do not consider that he is presenting any alterations that are concerning for recurrent obstruction at this point, but it is likely his abdominal pain is related to decreased blood flow to his liver after his embolization.  I explained to them that he can take 1 extra strength Tylenol every day at night when his symptoms are worse but he can also take an antispasmodic for now which I will prescribe to decrease the severity of his symptoms.  I will recheck a repeat CBC and CMP next week to make sure his liver enzymes and he has no presence of worsening leukocytosis.  The patien and his wife will like  to follow-up with Dr. Laural Golden in January after he undergoes his surgery.  - Repeat CBC and CMP on Monday - Continue oral antibiotics (ciprofloxacin and Augmentin) until seen at [facility] for right hepatectomy - Start Levsin 1 tablet q6h as needed for abdominal pain - Can take one extra strength Tylenol at night for pain  All questions were answered.      Harvel Quale, MD Gastroenterology and Hepatology [facility]

## 2021-06-12 NOTE — Patient Instructions (Addendum)
Perform blood workup On Monday Continue oral antibiotics until seen at [facility] 1 tablet q6h as needed for abdominal pain Can take one extra strength Tylenol at night for pain

## 2021-06-12 NOTE — Telephone Encounter (Signed)
Pt is being seen today by Dr. Jenetta Downer.

## 2021-06-17 LAB — CBC WITH DIFFERENTIAL/PLATELET
Basophils Absolute: 0.1 10*3/uL (ref 0.0–0.2)
Basos: 1 %
EOS (ABSOLUTE): 0.5 10*3/uL — ABNORMAL HIGH (ref 0.0–0.4)
Eos: 6 %
Hematocrit: 36.6 % — ABNORMAL LOW (ref 37.5–51.0)
Hemoglobin: 12.1 g/dL — ABNORMAL LOW (ref 13.0–17.7)
Immature Grans (Abs): 0 10*3/uL (ref 0.0–0.1)
Immature Granulocytes: 0 %
Lymphocytes Absolute: 2 10*3/uL (ref 0.7–3.1)
Lymphs: 24 %
MCH: 29.2 pg (ref 26.6–33.0)
MCHC: 33.1 g/dL (ref 31.5–35.7)
MCV: 88 fL (ref 79–97)
Monocytes Absolute: 0.9 10*3/uL (ref 0.1–0.9)
Monocytes: 11 %
Neutrophils Absolute: 4.8 10*3/uL (ref 1.4–7.0)
Neutrophils: 58 %
Platelets: 261 10*3/uL (ref 150–450)
RBC: 4.14 x10E6/uL (ref 4.14–5.80)
RDW: 14.2 % (ref 11.6–15.4)
WBC: 8.3 10*3/uL (ref 3.4–10.8)

## 2021-06-17 LAB — COMPREHENSIVE METABOLIC PANEL
ALT: 61 IU/L — ABNORMAL HIGH (ref 0–44)
AST: 68 IU/L — ABNORMAL HIGH (ref 0–40)
Albumin/Globulin Ratio: 1.2 (ref 1.2–2.2)
Albumin: 3.7 g/dL — ABNORMAL LOW (ref 3.8–4.8)
Alkaline Phosphatase: 454 IU/L — ABNORMAL HIGH (ref 44–121)
BUN/Creatinine Ratio: 17 (ref 10–24)
BUN: 12 mg/dL (ref 8–27)
Bilirubin Total: 1.7 mg/dL — ABNORMAL HIGH (ref 0.0–1.2)
CO2: 24 mmol/L (ref 20–29)
Calcium: 9.9 mg/dL (ref 8.6–10.2)
Chloride: 98 mmol/L (ref 96–106)
Creatinine, Ser: 0.69 mg/dL — ABNORMAL LOW (ref 0.76–1.27)
Globulin, Total: 3.1 g/dL (ref 1.5–4.5)
Glucose: 136 mg/dL — ABNORMAL HIGH (ref 70–99)
Potassium: 4.4 mmol/L (ref 3.5–5.2)
Sodium: 136 mmol/L (ref 134–144)
Total Protein: 6.8 g/dL (ref 6.0–8.5)
eGFR: 103 mL/min/{1.73_m2} (ref >59)

## 2021-06-30 DIAGNOSIS — F17210 Nicotine dependence, cigarettes, uncomplicated: Secondary | ICD-10-CM | POA: Insufficient documentation

## 2021-06-30 DIAGNOSIS — I70213 Atherosclerosis of native arteries of extremities with intermittent claudication, bilateral legs: Secondary | ICD-10-CM | POA: Diagnosis present

## 2021-06-30 DIAGNOSIS — R748 Abnormal levels of other serum enzymes: Secondary | ICD-10-CM | POA: Insufficient documentation

## 2021-07-16 NOTE — Progress Notes (Signed)
Jonathan Keith, [place] [address]   CLINIC:  Medical Oncology/Hematology  PCP:  [name], [place] / [place] [address] [phone]   REASON FOR VISIT:  Follow-up for intrahepatic cholangiocarcinoma  PRIOR THERAPY: none  NGS Results: not done  CURRENT THERAPY: surveillance  BRIEF ONCOLOGIC HISTORY:  Oncology History  Cholangiocarcinoma ([place])  02/28/2021 Initial Diagnosis   Cholangiocarcinoma ([place])   07/17/2021 Cancer Staging   Staging form: Perihilar Bile Ducts, AJCC 8th Edition - Clinical stage from 07/17/2021: Stage IVB (cTX, cNX, pM1) - Signed by Derek Jack, MD on 07/17/2021 Stage prefix: Initial diagnosis      CANCER STAGING:  Cancer Staging  Cholangiocarcinoma ([place]) Staging form: Perihilar Bile Ducts, AJCC 8th Edition - Clinical stage from 07/17/2021: Stage IVB (cTX, cNX, pM1) - Signed by Derek Jack, MD on 07/17/2021  INTERVAL HISTORY:  [name], a 65 y.o. male, returns for routine follow-up of his intrahepatic cholangiocarcinoma. Jonathan Keith was last seen on 03/07/2021 by Dr. Benay Pike.   Today he reports feeling fair, and he is accompanied by his wife. His wife reports he has lost 35 pounds in the past 5 months. His has no appetite as he reports everything tastes bad to him although he still will eat grapes. He reports headaches for which he takes tylenol. His energy is low. He denies SOB and tingling/numbness. He reports chronic reduced hearing bilaterally due to his history of working with loud machinery. He denies nausea. He denies history of autoimmune problems.    Prior to retirement, he worked with heavy machinery. He smokes 1-1.5 ppd, and has smoked for 52 years. His maternal grandmother had unspecified type of cancer, and his maternal uncle had brain cancer.   REVIEW OF SYSTEMS:  Review of Systems  Constitutional:  Positive for appetite change (25%) and fatigue  (depleted).  HENT:   Positive for hearing loss.   Respiratory:  Negative for shortness of breath.   Genitourinary:         Dark urine  Neurological:  Positive for dizziness and headaches. Negative for numbness.  Hematological:  Bruises/bleeds easily.  Psychiatric/Behavioral:  Positive for sleep disturbance.   All other systems reviewed and are negative.  PAST MEDICAL/SURGICAL HISTORY:  Past Medical History:  Diagnosis Date   Chronic pain    neck, back, knees   Class 1 obesity due to excess calories with body mass index (BMI) of 31.0 to 31.9 in adult    Claudication [facility])    DDD (degenerative disc disease), cervical    Diabetes mellitus ([place])    Dyslipidemia    Hypertension    Tobacco dependence    Past Surgical History:  Procedure Laterality Date   BILIARY BRUSHING N/A 02/25/2021   Procedure: BILIARY BRUSHING;  Surgeon: Rogene Houston, MD;  Location: [facility];  Service: Gastroenterology;  Laterality: N/A;   BILIARY STENT PLACEMENT N/A 02/25/2021   Procedure: BILIARY STENT PLACEMENT;  Surgeon: Rogene Houston, MD;  Location: [facility];  Service: Gastroenterology;  Laterality: N/A;   BILIARY STENT PLACEMENT  02/27/2021   Procedure: BILIARY STENT PLACEMENT (10FR x 9cm) IN THE RIGHT SYSTEM;  Surgeon: Rogene Houston, MD;  Location: [facility];  Service: Gastroenterology;;   BREAST SURGERY Left    benign lump- in his 50s.   ERCP N/A 02/25/2021   Procedure: ENDOSCOPIC RETROGRADE CHOLANGIOPANCREATOGRAPHY (ERCP);  Surgeon: Rogene Houston, MD;  Location: [facility];  Service: Gastroenterology;  Laterality: N/A;  ERCP N/A 02/27/2021   Procedure: ENDOSCOPIC RETROGRADE CHOLANGIOPANCREATOGRAPHY (ERCP);  Surgeon: Rogene Houston, MD;  Location: [facility];  Service: Gastroenterology;  Laterality: N/A;   KNEE SURGERY Left    x2   SPHINCTEROTOMY N/A 02/25/2021   Procedure: SPHINCTEROTOMY;  Surgeon: Rogene Houston, MD;  Location: [facility];  Service: Gastroenterology;  Laterality: N/A;   STENT REMOVAL   02/27/2021   Procedure: STENT REMOVAL (8.5Fr x 9cm);  Surgeon: Rogene Houston, MD;  Location: [facility];  Service: Gastroenterology;;    SOCIAL HISTORY:  Social History   Socioeconomic History   Marital status: Married    Spouse name: Not on file   Number of children: Not on file   Years of education: Not on file   Highest education level: Not on file  Occupational History   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  Tobacco Use   Smoking status: Every Day    Packs/day: 0.50    Years: 56.00    Pack years: 28.00    Types: Cigarettes   Smokeless tobacco: Never  Vaping Use   Vaping Use: Never used  Substance and Sexual Activity   Alcohol use: Not Currently    Comment: stopped 2.5 years ago 03/11/21   Drug use: Never   Sexual activity: Not on file  Other Topics Concern   Not on file  Social History Narrative   Not on file   Social Determinants of Health   Financial Resource Strain: Medium Risk   Difficulty of Paying Living Expenses: Somewhat hard  Food Insecurity: No Food Insecurity   Worried About Charity fundraiser in the Last Year: Never true   [place] in the Last Year: Never true  Transportation Needs: No Transportation Needs   Lack of Transportation (Medical): No   Lack of Transportation (Non-Medical): No  Physical Activity: Insufficiently Active   Days of Exercise per Week: 2 days   Minutes of Exercise per Session: 20 min  Stress: No Stress Concern Present   Feeling of Stress : Only a little  Social Connections: Moderately Integrated   Frequency of Communication with Friends and Family: More than three times a week   Frequency of Social Gatherings with Friends and Family: More than three times a week   Attends Religious Services: More than 4 times per year   Active Member of [employer] or Organizations: No   Attends Music therapist: Never   Marital Status: Married  Human resources officer Violence: Not At Risk   Fear of Current or Ex-Partner: No    Emotionally Abused: No   Physically Abused: No   Sexually Abused: No    FAMILY HISTORY:  Family History  Problem Relation Age of Onset   CAD Mother    Cancer Neg Hx     CURRENT MEDICATIONS:  Current Outpatient Medications  Medication Sig Dispense Refill   acetaminophen (TYLENOL) 500 MG tablet Take 1,000 mg by mouth every 6 (six) hours as needed.     enoxaparin (LOVENOX) 40 MG/0.4ML injection Inject 40 mg into the skin at bedtime.     famotidine (PEPCID) 20 MG tablet Take 20 mg by mouth 2 (two) times daily.     ferrous sulfate (FERROUSUL) 325 (65 FE) MG tablet Take 1 tablet (325 mg total) by mouth daily with breakfast.     metFORMIN (GLUCOPHAGE) 500 MG tablet Take 1 tablet (500 mg total) by mouth 2 (two) times daily with a meal. 60 tablet 0   oxyCODONE (OXY IR/ROXICODONE) 5  MG immediate release tablet Take 5 mg by mouth every 4 (four) hours while awake.     amoxicillin-clavulanate (AUGMENTIN) 875-125 MG tablet Take 1 tablet by mouth 2 (two) times daily. (Patient not taking: Reported on 07/17/2021)     No current facility-administered medications for this visit.    ALLERGIES:  No Known Allergies  PHYSICAL EXAM:  Performance status (ECOG): 0 - Asymptomatic  Vitals:   07/17/21 1302  BP: (!) 138/94  Pulse: (!) 112  Resp: 18  Temp: 98.4 F (36.9 C)  SpO2: 100%   Wt Readings from Last 3 Encounters:  07/17/21 162 lb (73.5 kg)  06/12/21 170 lb (77.1 kg)  05/26/21 175 lb 11.3 oz (79.7 kg)   Physical Exam Vitals reviewed.  Constitutional:      Appearance: Normal appearance.  Eyes:     General: Scleral icterus present.  Cardiovascular:     Rate and Rhythm: Normal rate and regular rhythm.     Pulses: Normal pulses.     Heart sounds: Normal heart sounds.  Pulmonary:     Effort: Pulmonary effort is normal.     Breath sounds: Normal breath sounds.  Abdominal:     General: A surgical scar is present.     Palpations: Abdomen is soft. There is hepatomegaly (R liver margin  palpable 2 fingerbreadths below costal margin). There is no splenomegaly or mass.     Tenderness: There is no abdominal tenderness.     Comments: Midline scar healed well  Musculoskeletal:     Right lower leg: No edema.     Left lower leg: No edema.  Neurological:     General: No focal deficit present.     Mental Status: He is alert and oriented to person, place, and time.  Psychiatric:        Mood and Affect: Mood normal.        Behavior: Behavior normal.     LABORATORY DATA:  I have reviewed the labs as listed.  CBC Latest Ref Rng & Units 06/16/2021 05/29/2021 05/28/2021  WBC 3.4 - 10.8 x10E3/uL 8.3 6.3 7.3  Hemoglobin 13.0 - 17.7 g/dL 12.1(L) 11.7(L) 11.2(L)  Hematocrit 37.5 - 51.0 % 36.6(L) 35.6(L) 33.4(L)  Platelets 150 - 450 x10E3/uL 261 278 217   CMP Latest Ref Rng & Units 06/16/2021 06/04/2021 05/29/2021  Glucose 70 - 99 mg/dL 136(H) - 148(H)  BUN 8 - 27 mg/dL 12 - 15  Creatinine 0.76 - 1.27 mg/dL 0.69(L) - 0.63  Sodium 134 - 144 mmol/L 136 - 133(L)  Potassium 3.5 - 5.2 mmol/L 4.4 - 4.4  Chloride 96 - 106 mmol/L 98 - 103  CO2 20 - 29 mmol/L 24 - 23  Calcium 8.6 - 10.2 mg/dL 9.9 - 8.9  Total Protein 6.0 - 8.5 g/dL 6.8 6.9 6.7  Total Bilirubin 0.0 - 1.2 mg/dL 1.7(H) 3.3(H) 5.4(H)  Alkaline Phos 44 - 121 IU/L 454(H) 764(H) 607(H)  AST 0 - 40 IU/L 68(H) 66(H) 85(H)  ALT 0 - 44 IU/L 61(H) 101(H) 88(H)    DIAGNOSTIC IMAGING:  I have independently reviewed the scans and discussed with the patient. No results found.   ASSESSMENT:  Metastatic cholangiocarcinoma: - 02/2021 initial diagnosis presentation with painless jaundice - ERCP with sphincterotomy, subsequent ERCP with plastic biliary stent placement. - MRI-2 cm mass involving the common hepatic duct with upstream biliary dilatation. - 03/2021 consult at [facility] with GI and surgery-confirm diagnosis of perihilar cholangiocarcinoma - 03/21/2021-biopsy/pathology, ERCP-common hepatic duct cytology and brushings performed.  EUS-1 enlarged lymph node versus tumor implant in the gastrohepatic ligament-negative for malignancy.  Single biliary plastic stent removed and replaced with the plastic stent in the right and left ducts. - 04/17/2021-right portal vein embolization to promote left hepatic hypertrophy. - 07/02/2021-diagnostic laparoscopy with multiple areas concerning for metastatic blocks identified in the abdomen and peritoneal surfaces.  6 separate biopsies obtained. - Pathology consistent with metastatic carcinoma. - 37 pound weight loss in the last 5 months.  2.  Social history/family: - Lives at home with his wife.  He worked as a Development worker, community. - Current active smoker, 1 pack/day since age 4. - Maternal grandmother had cancer.  Maternal uncle had brain tumor.    PLAN:  Metastatic cholangiocarcinoma: - We have discussed natural history, prognosis of metastatic cholangiocarcinoma. - We have discussed palliative chemotherapy with gemcitabine, cisplatin and durvalumab. - We discussed side effects in detail. - We will follow-up on [facility] results sent at [facility]. - I have recommended port placement. - He appears jaundiced today.  We will check LFTs, and tumor markers including CA 19-9, CEA as baseline. - He will start his first cycle around 07/22/2021 provided his labs are adequate. - I will see him back on day 8 of cycle 1.  2.  Weight loss: - He lost about 37 pounds in the last 5 months. - Reports decreased appetite and loss of taste. - We will start him on Megace 400 mg twice daily.     Orders placed this encounter:  Orders Placed This Encounter  Procedures   CBC with Differential   Comprehensive metabolic panel   Cancer antigen 19-9   CEA   Bilirubin, direct   AFP tumor marker      Derek Jack, MD [place] [phone]   I, Thana Ates, am acting as a scribe for Dr. Derek Jack.  I, Derek Jack MD, have reviewed the above  documentation for accuracy and completeness, and I agree with the above.

## 2021-07-17 ENCOUNTER — Other Ambulatory Visit (HOSPITAL_COMMUNITY): Payer: Self-pay | Admitting: Hematology

## 2021-07-17 ENCOUNTER — Other Ambulatory Visit: Payer: Self-pay | Admitting: Radiology

## 2021-07-17 ENCOUNTER — Inpatient Hospital Stay (HOSPITAL_COMMUNITY): Payer: Medicare Other | Attending: Hematology | Admitting: Hematology

## 2021-07-17 ENCOUNTER — Encounter (HOSPITAL_COMMUNITY): Payer: Self-pay | Admitting: Hematology

## 2021-07-17 ENCOUNTER — Encounter (HOSPITAL_COMMUNITY): Payer: Self-pay

## 2021-07-17 ENCOUNTER — Inpatient Hospital Stay (HOSPITAL_COMMUNITY): Payer: Medicare Other

## 2021-07-17 ENCOUNTER — Other Ambulatory Visit: Payer: Self-pay

## 2021-07-17 VITALS — BP 138/94 | HR 112 | Temp 98.4°F | Resp 18 | Ht 70.0 in | Wt 162.0 lb

## 2021-07-17 DIAGNOSIS — R978 Other abnormal tumor markers: Secondary | ICD-10-CM

## 2021-07-17 DIAGNOSIS — C799 Secondary malignant neoplasm of unspecified site: Secondary | ICD-10-CM | POA: Diagnosis not present

## 2021-07-17 DIAGNOSIS — Z5112 Encounter for antineoplastic immunotherapy: Secondary | ICD-10-CM | POA: Diagnosis not present

## 2021-07-17 DIAGNOSIS — E785 Hyperlipidemia, unspecified: Secondary | ICD-10-CM | POA: Diagnosis not present

## 2021-07-17 DIAGNOSIS — R519 Headache, unspecified: Secondary | ICD-10-CM | POA: Diagnosis not present

## 2021-07-17 DIAGNOSIS — Z79899 Other long term (current) drug therapy: Secondary | ICD-10-CM | POA: Diagnosis not present

## 2021-07-17 DIAGNOSIS — G479 Sleep disorder, unspecified: Secondary | ICD-10-CM | POA: Diagnosis not present

## 2021-07-17 DIAGNOSIS — H9190 Unspecified hearing loss, unspecified ear: Secondary | ICD-10-CM

## 2021-07-17 DIAGNOSIS — R5383 Other fatigue: Secondary | ICD-10-CM | POA: Diagnosis not present

## 2021-07-17 DIAGNOSIS — E119 Type 2 diabetes mellitus without complications: Secondary | ICD-10-CM | POA: Diagnosis not present

## 2021-07-17 DIAGNOSIS — I10 Essential (primary) hypertension: Secondary | ICD-10-CM

## 2021-07-17 DIAGNOSIS — C221 Intrahepatic bile duct carcinoma: Secondary | ICD-10-CM

## 2021-07-17 DIAGNOSIS — F17210 Nicotine dependence, cigarettes, uncomplicated: Secondary | ICD-10-CM

## 2021-07-17 DIAGNOSIS — Z5189 Encounter for other specified aftercare: Secondary | ICD-10-CM | POA: Diagnosis not present

## 2021-07-17 DIAGNOSIS — Z5111 Encounter for antineoplastic chemotherapy: Secondary | ICD-10-CM | POA: Insufficient documentation

## 2021-07-17 DIAGNOSIS — Z8249 Family history of ischemic heart disease and other diseases of the circulatory system: Secondary | ICD-10-CM | POA: Diagnosis not present

## 2021-07-17 DIAGNOSIS — R109 Unspecified abdominal pain: Secondary | ICD-10-CM | POA: Insufficient documentation

## 2021-07-17 DIAGNOSIS — R634 Abnormal weight loss: Secondary | ICD-10-CM | POA: Diagnosis not present

## 2021-07-17 LAB — COMPREHENSIVE METABOLIC PANEL
ALT: 112 U/L — ABNORMAL HIGH (ref 0–44)
AST: 74 U/L — ABNORMAL HIGH (ref 15–41)
Albumin: 3.2 g/dL — ABNORMAL LOW (ref 3.5–5.0)
Alkaline Phosphatase: 651 U/L — ABNORMAL HIGH (ref 38–126)
Anion gap: 9 (ref 5–15)
BUN: 22 mg/dL (ref 8–23)
CO2: 23 mmol/L (ref 22–32)
Calcium: 9.9 mg/dL (ref 8.9–10.3)
Chloride: 102 mmol/L (ref 98–111)
Creatinine, Ser: 0.99 mg/dL (ref 0.61–1.24)
GFR, Estimated: >60 mL/min (ref >60)
Glucose, Bld: 139 mg/dL — ABNORMAL HIGH (ref 70–99)
Potassium: 4.9 mmol/L (ref 3.5–5.1)
Sodium: 134 mmol/L — ABNORMAL LOW (ref 135–145)
Total Bilirubin: 4.3 mg/dL — ABNORMAL HIGH (ref 0.3–1.2)
Total Protein: 8 g/dL (ref 6.5–8.1)

## 2021-07-17 LAB — CBC WITH DIFFERENTIAL/PLATELET
Abs Immature Granulocytes: 0.06 10*3/uL (ref 0.00–0.07)
Basophils Absolute: 0.1 10*3/uL (ref 0.0–0.1)
Basophils Relative: 1 %
Eosinophils Absolute: 0.3 10*3/uL (ref 0.0–0.5)
Eosinophils Relative: 3 %
HCT: 39 % (ref 39.0–52.0)
Hemoglobin: 12.8 g/dL — ABNORMAL LOW (ref 13.0–17.0)
Immature Granulocytes: 1 %
Lymphocytes Relative: 18 %
Lymphs Abs: 2.2 10*3/uL (ref 0.7–4.0)
MCH: 31.8 pg (ref 26.0–34.0)
MCHC: 32.8 g/dL (ref 30.0–36.0)
MCV: 97 fL (ref 80.0–100.0)
Monocytes Absolute: 0.9 10*3/uL (ref 0.1–1.0)
Monocytes Relative: 8 %
Neutro Abs: 8.7 10*3/uL — ABNORMAL HIGH (ref 1.7–7.7)
Neutrophils Relative %: 69 %
Platelets: 390 10*3/uL (ref 150–400)
RBC: 4.02 MIL/uL — ABNORMAL LOW (ref 4.22–5.81)
RDW: 14.9 % (ref 11.5–15.5)
WBC: 12.2 10*3/uL — ABNORMAL HIGH (ref 4.0–10.5)
nRBC: 0 % (ref 0.0–0.2)

## 2021-07-17 LAB — BILIRUBIN, DIRECT: Bilirubin, Direct: 2.6 mg/dL — ABNORMAL HIGH (ref 0.0–0.2)

## 2021-07-17 MED ORDER — MEGESTROL ACETATE 400 MG/10ML PO SUSP: 400.0000 mg | Freq: Two times a day (BID) | ORAL | 0 refills | Status: DC

## 2021-07-17 NOTE — Patient Instructions (Addendum)
[place] at [facility] Discharge Instructions  You were seen and examined today by Dr. Delton Coombes. Dr. Delton Coombes discussed your cancer diagnosis.  You have been diagnosed with Stage IV cholangiocarcinoma. This means that the cancer cannot be cured but it can be controlled with chemotherapy and immunotherapy treatments. This is not a very common cancer and it can be difficult to treat, Dr. Delton Coombes has recommended starting with the standard of care.  The safest way to administer chemotherapy administration is via a Port-A-Cath placement. A Port-A-Cath can be placed in interventional radiology in [place].  During treatment, you will need to maintain adequate nutrition of at least 1800 calories today. You will need to drink at least 80 to 100oz of fluids daily.  Follow-up as scheduled.   Thank you for choosing [place] at [facility] to provide your oncology and hematology care.  To afford each patient quality time with our provider, please arrive at least 15 minutes before your scheduled appointment time.   If you have a lab appointment with the [facility] please come in thru the Main Entrance and check in at the main information desk.  You need to re-schedule your appointment should you arrive 10 or more minutes late.  We strive to give you quality time with our providers, and arriving late affects you and other patients whose appointments are after yours.  Also, if you no show three or more times for appointments you may be dismissed from the clinic at the providers discretion.     Again, thank you for choosing [facility].  Our hope is that these requests will decrease the amount of time that you wait before being seen by our physicians.       _____________________________________________________________  Should you have questions after your visit to [facility], please contact our office at [phone]  and follow the prompts.  Our office hours are 8:00 a.m. and 4:30 p.m. Monday - Friday.  Please note that voicemails left after 4:00 p.m. may not be returned until the following business day.  We are closed weekends and major holidays.  You do have access to a nurse 24-7, just call the main number to the clinic [phone] and do not press any options, hold on the line and a nurse will answer the phone.    For prescription refill requests, have your pharmacy contact our office and allow 72 hours.    Due to Covid, you will need to wear a mask upon entering the hospital. If you do not have a mask, a mask will be given to you at the Main Entrance upon arrival. For doctor visits, patients may have 1 support person age 20 or older with them. For treatment visits, patients can not have anyone with them due to social distancing guidelines and our immunocompromised population.

## 2021-07-17 NOTE — Progress Notes (Signed)
START OFF PATHWAY REGIMEN - Other ° ° °[id number]:Cisplatin IV D1,8 + Durvalumab 1,500 mg IV D1 + Gemcitabine IV D1,8 q21 Days for up to 8 Cycles Followed by Durvalumab 1,500 mg IV D1 q28 Days: °  Cycles 1 through up to 8: A cycle is every 21 days: °    Durvalumab  °    Gemcitabine  °    Cisplatin  °  Cycles 9 and beyond: A cycle is every 28 days: °    Durvalumab  ° °**Always confirm dose/schedule in your pharmacy ordering system** ° °Patient Characteristics: °Intent of Therapy: °Non-Curative / Palliative Intent, Discussed with Patient °

## 2021-07-17 NOTE — Progress Notes (Signed)
I met with the patient and his wife today during and after visit with Dr. Delton Coombes. I provided written information on Gemcitabine, Cisplatin and Imfinzi as discussed by Dr. Delton Coombes. I provided my contact information and encouraged the patient and wife to call with questions or concerns.

## 2021-07-18 ENCOUNTER — Encounter (HOSPITAL_COMMUNITY): Payer: Self-pay | Admitting: Hematology

## 2021-07-18 ENCOUNTER — Other Ambulatory Visit: Payer: Self-pay | Admitting: Radiology

## 2021-07-19 LAB — CANCER ANTIGEN 19-9: CA 19-9: 752 U/mL — ABNORMAL HIGH (ref 0–35)

## 2021-07-19 LAB — CEA: CEA: 3.9 ng/mL (ref 0.0–4.7)

## 2021-07-19 LAB — AFP TUMOR MARKER: AFP, Serum, Tumor Marker: 2 ng/mL (ref 0.0–8.4)

## 2021-07-20 ENCOUNTER — Encounter (HOSPITAL_COMMUNITY): Payer: Self-pay

## 2021-07-20 ENCOUNTER — Encounter (HOSPITAL_COMMUNITY): Payer: Self-pay | Admitting: Hematology

## 2021-07-20 DIAGNOSIS — Z95828 Presence of other vascular implants and grafts: Secondary | ICD-10-CM | POA: Insufficient documentation

## 2021-07-20 HISTORY — DX: Presence of other vascular implants and grafts: Z95.828

## 2021-07-20 MED ORDER — PROCHLORPERAZINE MALEATE 10 MG PO TABS: 10.0000 mg | ORAL_TABLET | Freq: Four times a day (QID) | ORAL | 3 refills | Status: DC | PRN

## 2021-07-20 MED ORDER — LIDOCAINE-PRILOCAINE 2.5-2.5 % EX CREA: TOPICAL_CREAM | CUTANEOUS | 3 refills | Status: DC

## 2021-07-20 NOTE — Patient Instructions (Addendum)
[facility] Chemotherapy Teaching   You are diagnosed with metastatic (Stage IV) cholangiocarcinoma.  You will receive treatment in the clinic with a combination of chemotherapy drugs and an immunotherapy drug.  Those drugs are cisplatin, gemcitabine (Gemzar), and durvalumab (Imfinzi).  You will come to the clinic weekly x 2 weeks for your infusions, then you will have a week off before you return for your next cycle of treatment, at which time the process will repeat (2 weekly treatments with one week off prior to starting the next cycle of treatment).  On the first weekly treatment, you will receive all three drugs.  On the second week of treatment, you will receive only cisplatin and Gemzar.  After the second weekly treatment, we will have you return to the clinic 1 to 2 days after receiving chemotherapy to receive a white blood cell booster shot as well.  The intent of treatment is to control this cancer, prevent it from spreading further, and to alleviate any symptoms you may be having related to this disease.  You will see the doctor regularly throughout treatment. We will obtain blood work from you prior to every treatment and monitor your results to make sure it is safe to give your treatment. The doctor monitors your response to treatment by the way you are feeling, your blood work, and by obtaining scans periodically.  There will be wait times while you are here for treatment. It will take about 30 minutes to 1 hour for your lab work to result.  Then there will be wait times while pharmacy mixes your medications.    Medications you will receive in the clinic prior to your chemotherapy medications:  Aloxi:  ALOXI is used in adults to help prevent nausea and vomiting that happens with certain chemotherapy drugs.  Aloxi is a long acting medication, and will remain in your system for about two days.   Emend:  This is an anti-nausea medication that is used with Aloxi to help prevent  nausea and vomiting caused by chemotherapy.  Dexamethasone:  This is a steroid given prior to chemotherapy to help prevent allergic reactions; it may also help prevent and control nausea and diarrhea.   Medication you will receive after treatment:  Neulasta (or similar) - this medication is not chemotherapy but is being given because you have had chemo. It is usually given 24-48 hours after the completion of chemotherapy. This medication works by boosting your bone marrow's supply of white blood cells. White blood cells are what protect our bodies against infection. The medication is given in the form of a subcutaneous injection. It is given in the fatty tissue of your abdomen, or in the skin on the back of your arm . It is a short needle. The major side effect of this medication is bone or muscle pain. The drug of choice to relieve or lessen the pain is Aleve or Ibuprofen. If a physician has ever told you not to take Aleve or Ibuprofen - then don't take it. You should then take Tylenol/acetaminophen. Take either medication as the bottle directs you to.  The level of pain you experience as a result of this injection can range from none, to mild or moderate, or severe. Please let us know if you develop moderate or severe bone pain.   You can take Claritin 10 mg over the counter for a few days before and after receiving Neulasta to help with the bone aches and pains.  CISPLATIN  About This Drug Cisplatin is a drug used to treat cancer. This drug is given in the vein (IV).  This will take 1 hour to infuse.  With this drug you will receive 2 hours of IV fluid hydration prior to administration, and 1 hour of IV hydration after administration.  This is to help protect your kidneys.  You will have to urinate 200 mL prior to receiving this medication.  We will give you something to measure your urine in.   Possible Side Effects (More Common)  This drug may affect how your kidneys work. Your kidney function  will be checked as needed.   Electrolyte changes. Your blood will be checked for electrolyte changes as needed.   High-frequency hearing loss may occur. You will get IV fluids before and during the Cisplatin infusion to help prevent this. You may also get ringing in the ears.   Bone marrow depression. This is a decrease in the number of white blood cells, red blood cells, and platelets. This may raise your risk of infection, make you tired and weak (fatigue), and raise your risk of bleeding.   Nausea and throwing up (vomiting). These symptoms may happen within a few hours after your treatment and may last for a few days to a week. Medicines are available to stop or lessen these side effects.  Possible Side Effects (Less Common)  Effects on the nerves are called peripheral neuropathy. You may feel numbness or pain in your hands and feet. It may be hard for you to button your clothes, open jars, or walk as usual. The effect on the nerves may get worse with more doses of the drug. These effects get better in some people after the drug is stopped, but it does not get better in all people.   Blurred vision or other changes in eyesight.   Soreness of the mouth and throat. You may have red areas, white patches, or sores that hurt.   Hair loss. You may notice your hair getting thin. Some patients lose their hair. Your hair often grows back when treatment is done.  Allergic Reactions Allergic reactions to this drug are rare, but may happen in some patients. Signs of allergic reactions to this drug may be a rash, fever, chills, feeling dizzy, trouble breathing, and/or feeling that your heart is beating in a fast or not normal way.  Treating Side Effects  Drink 6-8 cups of fluids each day unless your doctor has told you to limit your fluid intake due to some other health problem. A cup is 8 ounces of fluid. If you throw up or have loose bowel movements you should drink more fluids so that you do not  become dehydrated (lack water in the body due to losing too much fluid).   If you have numbness and tingling in your hands and feet, be careful when cooking, walking, and handling sharp objects and hot liquids.   Mouth care is very important. Your mouth care should consist of routine, gentle cleaning of your teeth or dentures and rinsing your mouth with a mixture of 1/2 teaspoon of salt in 8 ounces of water or  teaspoon of baking soda in 8 ounces of water. This should be done at least after each meal and at bedtime.   If you have mouth sores, avoid mouthwash that has alcohol. Also avoid alcohol and smoking because they can bother your mouth and throat.   Talk with your nurse about getting a wig before  you lose your hair. Also, call the [employer] at 800-ACS-2345 to find out information about the "Look Good, Feel Better" program close to where you live. It is a free program where women getting chemotherapy can learn about wigs, turbans and scarves as well as makeup techniques and skin and nail care.  Food and Drug Interactions  There are no known interactions of Cisplatin with food. This drug may interact with other medicines. Tell your doctor and pharmacist about all the medicines and dietary supplements (vitamins, minerals, herbs and others) that you are taking at this time. The safety and use of dietary supplements and alternative diets are often not known. Using these might affect your cancer or interfere with your treatment. Until more is known, you should not use dietary supplements or alternative diets without your cancer doctor's help.  When to Call the Doctor  Call your doctor or nurse right away if you have any of these symptoms:  Rash or itching   Feeling dizzy or lightheaded   Wheezing or trouble breathing   Swelling of the face   Fever of 100.4 F (38 C) or above   Chills   Easy bleeding or bruising   Decreased urine   Weight gain of 5 pounds in one week  (fluid retention)   Nausea that stops you from eating or drinking   Throwing up more than 3 times a day  Call your doctor or nurse as soon as possible if you have these symptoms:  Numbness, tingling, decreased feeling or weakness in fingers, toes, arms, or legs   Trouble walking or changes in the way you walk, feeling clumsy when buttoning clothes, opening jars, or other routine hand motions   Blurred vision or other changes in eyesight   Changes in hearing, ringing in the ears   Pain in your mouth or throat that makes it hard to eat or drink   Fatigue that interferes with your daily activities  Sexual Problems and Reproductive Concerns   Infertility warning: Sexual problems and reproduction concerns may occur. In both men and women, this drug may affect your ability to have children. This cannot be determined before your treatment. Speak with your doctor or nurse if you plan to have children. Ask for information on sperm or egg banking.   In men, this drug may interfere with your ability to make sperm, but it should not change your ability to have sexual relations.   In women, menstrual bleeding may become irregular or stop while you are receiving this drug. Do not assume that you cannot become pregnant if you do not have a menstrual period.   Women may experience signs of menopause like vaginal dryness, itching, and pain during sexual relations   Genetic counseling is available for you to talk about the effects of this drug therapy on future pregnancies. Also, a genetic counselor can look at the possible risk of problems in the unborn baby due to this medicine if an exposure happens during pregnancy.   Gemcitabine (Gemzar)  About This Drug Gemcitabine is used to treat cancer. It is given in the vein (IV).  It will take 30 minutes to infuse.  Possible Side Effects  Bone marrow suppression. This is a decrease in the number of white blood cells, red blood cells, and platelets.  This may raise your risk of infection, make you tired and weak (fatigue), and raise your risk of bleeding.   Fever   Trouble breathing   Nausea and throwing  up (vomiting)   Changes in your liver function   Increased protein in your urine, which can affect how your kidneys work   Blood in your urine   Rash   Swelling of your legs, ankles and/or feet  Note: Each of the side effects above was reported in 20% or greater of patients treated with Gemcitabine. Not all possible side effects are included above.  Warnings and Precautions  Severe bone marrow suppression   Inflammation (swelling) of the lungs and/or thickening of the lung tissues, which may be lifethreatening. You may have a dry cough or trouble breathing.   Changes in your kidney function, which can cause kidney failure   Changes in your liver function, which can cause liver failure and may be life-threatening   If you have received radiation treatments, your skin may become red and/or you may develop soreness of the mouth and throat after gemcitabine. This reaction is called "recall." Your body is recalling, or remembering, that it had radiation therapy.   A syndrome where fluid from your veins can leak into your tissues and cause a decrease in your blood pressure and fluid to accumulate in your tissues and/or lungs.   A syndrome can occur that causes changes to kidney and liver function in combination with a decrease in red blood cells. Kidney failure may result which may be life-threatening.   Changes in your central nervous system can happen. The central nervous system is made up of your brain and spinal cord. You could feel extreme tiredness, agitation, confusion, hallucinations (see or hear things that are not there), have trouble understanding or speaking, loss of control of your bowels or bladder, eyesight changes, numbness or lack of strength to your arms, legs, face, or body, seizures or coma. If you start to have  any of these symptoms let your doctor know right away.  Note: Some of the side effects above are very rare. If you have concerns and/or questions, please discuss them with your medical team.  Important Information  This drug may be present in the saliva, tears, sweat, urine, stool, vomit, semen, and vaginal secretions. Talk to your doctor and/or your nurse about the necessary precautions to take during this time.  Treating Side Effects  Manage tiredness by pacing your activities for the day.   Be sure to include periods of rest between energy-draining activities.   To decrease the risk of infection, wash your hands regularly.   Avoid close contact with people who have a cold, the flu, or other infections.   Take your temperature as your doctor or nurse tells you, and whenever you feel like you may have a fever.   To help decrease bleeding, use a soft toothbrush. Check with your nurse before using dental floss.   Be very careful when using knives or tools.   Use an electric shaver instead of a razor.   Drink plenty of fluids (a minimum of eight glasses per day is recommended).   If you throw up or have loose bowel movements, you should drink more fluids so that you do not become dehydrated (lack of water in the body from losing too much fluid).   To help with nausea and vomiting, eat small, frequent meals instead of three large meals a day. Choose foods and drinks that are at room temperature. Ask your nurse or doctor about other helpful tips and medicine that is available to help stop or lessen these symptoms.   If you get a rash do  not put anything on it unless your doctor or nurse says you may. Keep the area around the rash clean and dry. Ask your doctor for medicine if your rash bothers you.   If you received radiation, and your skin becomes red or irritated again, or you develop soreness of the mouth and throat, follow the same care instructions you did during radiation treatment.  Be sure to tell the nurse or doctor administering your chemotherapy about your skin changes.  Food and Drug Interactions  There are no known interactions of gemcitabine with food.   This drug may interact with other medicines. Tell your doctor and pharmacist about all the prescription and over-the-counter medicines and dietary supplements (vitamins, minerals, herbs and others) that you are taking at this time. Also, check with your doctor or pharmacist before starting any new prescription or over-the-counter medicines, or dietary supplements to make sure that there are no interactions.  When to Call the Doctor Call your doctor or nurse if you have any of these symptoms and/or any new or unusual symptoms:   Fever of 100.4 F (38 C) or higher   Chills   Tiredness that interferes with your daily activities   Feeling dizzy or lightheaded   Pain in your chest   Dry cough   Wheezing and/or trouble breathing   Confusion and/or agitation   Symptoms of a seizure such as confusion, blacking out, passing out, loss of hearing or vision, blurred vision, unusual smells or tastes (such as burning rubber), trouble talking, tremors or shaking in parts or all of the body, repeated body movements, tense muscles that do not relax, and loss of control of urine and bowels. If you or your family member suspects you are having a seizure, call 911 right away.   Hallucinations   Trouble understanding or speaking   Blurry vision or changes in your eyesight   Numbness or lack of strength to your arms, legs, face, or body   Easy bleeding or bruising   Nausea that stops you from eating or drinking and/or is not relieved by prescribed medicines   Throwing up    Swelling of legs, ankles, or feet   Weight gain of 5 pounds in one week (fluid retention)   Blood in urine   Decreased urine or very dark urine   Foamy or bubbly-looking urine   A new rash/itching or a rash that is not relieved by  prescribed medicines   Signs of possible liver problems: dark urine, pale bowel movements, bad stomach pain, feeling very tired and weak, unusual itching, or yellowing of the eyes or skin   If you think you may be pregnant or may have impregnated your partner  Reproduction Warnings   Pregnancy warning: This drug can have harmful effects on the unborn baby. Women of childbearing potential should use effective methods of birth control during your cancer treatment and for 6 months after treatment. Men with male partners of childbearing potential should use effective methods of birth control during your cancer treatment and for 3 months after your cancer treatment. Let your doctor know right away if you think you may be pregnant or may have impregnated your partner.   Breastfeeding warning: Women should not breastfeed during treatment and for 1 week after treatment because this drug could enter the breast milk and cause harm to a breastfeeding baby.   Fertility warning: In men, this drug may affect your ability to have children in the future. Talk with your doctor or  nurse if you plan to have children. Ask for information on sperm banking.   Durvalumab (Imfinzi)    About This Drug  Durvalumab is used to treat cancer. This drug is given in the vein (IV). It will take 1 hour to infuse.    Possible Side Effects   Nausea   Tiredness   Weakness   Inflammation (swelling) of the lungs. You may have a dry cough or trouble breathing.   Cough   Trouble breathing   Upper respiratory tract infection   Rash   Hair loss. Hair loss is often temporary, although with certain medicine, hair loss can sometimes be permanent. Hair loss may happen suddenly or gradually. If you lose hair, you may lose it from your head, face, armpits, pubic area, chest, and/or legs. You may also notice your hair getting thin.   Note: Each of the side effects above was reported in 20% or greater of patients treated with  durvalumab. Your side effects may be different depending on your specific condition. Not all possible side effects are included above.   Warnings and Precautions   This drug works with your immune system and can cause inflammation (swelling) in any of your organs and tissues and can change how they work. This may put you at risk for developing serious medical problems, which can be life-threatening. These side effects may require treatment with steroids at the discretion of your doctor.    Severe inflammation of the lungs, which can be life-threatening. You may have a dry cough or trouble breathing.    Colitis, which is swelling (inflammation) in the colon. The symptoms are diarrhea (loose bowel movements), stomach cramping, and sometimes blood in the bowel movements.    Changes in your central nervous system can happen. The central nervous system is made up of your brain and spinal cord. You could feel extreme tiredness, agitation, confusion, hallucinations (see or hear things that are not there), trouble understanding or speaking, loss of control of your bowels or bladder, eyesight changes, numbness, or lack of strength to your arms, legs, face, or body, and coma. If you start to have any of these symptoms let your doctor know right away.    Severe changes in your liver function, which can cause liver failure and be life-threatening.    This drug may affect some of your hormone glands (especially the thyroid, adrenals, pituitary, and pancreas).    Blood sugar levels may change, and you may develop diabetes. If you already have diabetes, changes may need to be made to your diabetes medication.    Changes in your kidney function    Allergic skin reaction, which can be life-threatening. You may develop blisters on your skin that are filled with fluid or a severe red rash all over your body that may be painful.    While you are getting this drug in your vein (IV), you may have a reaction to the  drug which can be life-threatening. Sometimes you may be given medication to stop or lessen these side effects. Your nurse will check you closely for these signs: fever or shaking chills, flushing, facial swelling, feeling dizzy, headache, trouble breathing, rash, itching, chest tightness, or chest pain. If this happens, call 911 for emergency care.    Increased risk of serious complications which can be life-threatening such as graft versus host disease (GVHD) in patients who undergo a stem cell transplant before or after receiving durvalumab.    Increased risk of organ rejection in patients  who have received donor organs. Note: Some of the side effects above are very rare. If you have concerns and/or questions, please discuss them with your medical team. Important Information    This drug may be present in the saliva, tears, sweat, urine, stool, vomit, semen, and vaginal secretions. Talk to your doctor and/or your nurse about the necessary precautions to take during this time.   Treating Side Effects   Manage tiredness by pacing your activities for the day.    Be sure to include periods of rest between energy-draining activities.    To help decrease the risk of bleeding, use a soft toothbrush. Check with your nurse before using dental floss.  Be very careful when using knives or tools.    Use an electric shaver instead of a razor.    Drink plenty of fluids (a minimum of eight glasses per day is recommended).    To help with nausea, eat small, frequent meals instead of three large meals a day. Choose foods and drinks that are at room temperature.    If you have diarrhea, eat low-fiber foods that are high in protein and calories, and avoid foods that can irritate your digestive tracts or lead to cramping. You should also drink more fluids so that you do not become dehydrated (lack of water in the body from losing too much fluid).    Ask your doctor or nurse about medicine that is available to  help stop or lessen diarrhea, and/or nausea.    If you have diabetes, keep good control of your blood sugar level. Tell your nurse or your doctor if your glucose levels are higher or lower than normal.    Keeping your pain under control is important to your well-being. Please tell your doctor or nurse if you are experiencing pain.    If you get a rash do not put anything on it unless your doctor or nurse says you may. Keep the area around the rash clean and dry. Ask your doctor for medicine if your rash bothers you.    To help with hair loss, wash with a mild shampoo and avoid washing your hair every day. Avoid coloring your hair.    Avoid rubbing your scalp, pat your hair or scalp dry.    Limit your use of hair spray, electric curlers, blow dryers, and curling irons.    If you are interested in getting a wig, talk to your nurse and they can help you get in touch with programs in your local area.    If you have numbness and tingling in your hands and feet, be careful when cooking, walking, and handling sharp objects and hot liquids.    Infusion reactions may happen after your infusion. If this happens, call 911 for emergency care.   Food and Drug Interactions   There are no known interactions of durvalumab with food.    This drug may interact with other medicines. Tell your doctor and pharmacist about all the prescription and over-the-counter medicines and dietary supplements (vitamins, minerals, herbs, and others) that you are taking at this time. Also, check with your doctor or pharmacist before starting any new prescription or over-the-counter medicines, or dietary supplements to make sure that there are no interactions.   When to Call the Doctor  Not all possible side effects are included. Some of these side effects, although rare, can be lifethreatening.   Lung problems:   Inflammation of the lungs   Cough   Trouble breathing  Upper respiratory tract infection Call your doctor  or nurse if you have any of these symptoms:   Wheezing and/or trouble breathing   New or worsening cough   Coughing up yellow, green, or bloody mucus   Chest pain   Stomach problems:   Decreased appetite (decreased hunger)   Nausea and vomiting (throwing up)   Diarrhea (loose bowel movements)   Constipation (unable to move bowels)   Pain in your abdomen   Inflammation of your colon   Blood in your stool   Call your doctor or nurse if you have any of these symptoms:   Nausea that stops you from eating or drinking or is not relieved by prescribed medicine    Throwing up more than 3 times a day    Lasting loss of appetite or rapid weight loss of five pounds in a week    Diarrhea, 4 times in one day or diarrhea with lack of strength or a feeling of being dizzy    No bowel movement for 3 days or when you feel uncomfortable    Pain in your abdomen that does not go away    Blood in your stool (bright red, or black/tarry)   Liver problems:   Changes in your liver function   Call your doctor or nurse if you have any of these symptoms:   Yellowing of the eyes or skin    Dark urine    Pale bowel movements    Pain on the right side of your abdomen that does not go away    Feeling very tired and weak    Unusual itching     Easy bleeding or bruising   Hormone gland problems:   Changes in some of your hormone glands (especially the thyroid, adrenals, pituitary and pancreas)   Blood sugar levels may change, and you may develop diabetes   Call your doctor or nurse if you have any of these symptoms:   Headache that does not go away    Tiredness that interferes with your daily activities    Feeling dizzy or lightheaded    Changes in mood or behavior such as irritability and/or feeling forgetful    Shakiness    Weight loss or weight gain    Nausea    Abnormal blood sugar    Unusual thirst or passing urine often    Feeling cold   Kidney problems:   Changes in your  kidney function   Urinary tract infection   Call your doctor or nurse if you have any of these symptoms:   Decreased or very dark urine    Cloudy urine and/or urine that smells bad    Difficulty urinating    Pain or burning when you pass urine    Feeling like you have to pass urine often, but not much comes out when you do    Tender or heavy feeling in your lower abdomen    Pain on one side of your back under your ribs   Skin problems:   Rash and itching   Soreness of the mouth and throat   Allergic skin reaction   Call your doctor or nurse if you have any of these symptoms:   New rash and/or itching    Fluid-filled bumps/blisters    Rash that is not relieved by prescribed medicines    Red areas, white patches, or sores in your mouth that hurt   Inflammation of the brain:   Changes in your brain  and spinal cord   Headache   Effects on the nerves   Call your doctor or nurse if you have any of these symptoms:   Headache that does not go away    Extreme tiredness, agitation, or confusion    Seizures    Hallucinations    Trouble understanding or speaking    Loss of control of bowels or bladder    Numbness or lack of strength to your arms, legs, face, or body    Numbness, tingling, pins, and needles, or pain in your arms, hands, legs, or feet   Other problems:   Low red blood cells, and platelets   Fever   Inflammation of your eye and/or other changes in vision   Allergic reaction to the drug   Heart problems   Electrolyte changes   Muscle, bone, and joint pain   Call your doctor or nurse if you have any of these symptoms:   Fever of 100.4 F (38 C) or higher    Chills, flushing    Easy bleeding or bruising    Blurred vision or other changes in eyesight    Sensitivity to light    Feeling that your heart is beating fast or in a not normal way (palpitations)    Signs of infusion reaction: fever or shaking chills, flushing, facial swelling, feeling  dizzy, headache, trouble breathing, rash, itching, chest tightness, or chest pain. If this happens, call 911 for emergency care.    Pain that does not go away, or is not relieved by prescribed medicines    Extreme muscle weakness   Reproduction Warnings   Pregnancy warning: This drug can have harmful effects on the unborn baby. Women of childbearing potential should use effective methods of birth control during your cancer treatment and for at least 3 months after stopping treatment. Let your doctor know right away if you think you may be pregnant.    Breastfeeding warning: Women should not breastfeed during treatment and for at least 3 months after stopping treatment because this drug could enter the breast milk and cause harm to a breastfeeding baby.    Fertility warning: Fertility studies have not been done with this drug. Talk with your doctor or nurse if you plan to have children. Ask for information on sperm or egg banking.     SELF CARE ACTIVITIES WHILE RECEIVING CHEMOTHERAPY/IMMUNOTHERAPY:  Hydration Increase your fluid intake 48 hours prior to treatment and drink at least 8 to 12 cups (64 ounces) of water/decaffeinated beverages per day after treatment. You can still have your cup of coffee or soda but these beverages do not count as part of your 8 to 12 cups that you need to drink daily. No alcohol intake.  Medications Continue taking your normal prescription medication as prescribed.  If you start any new herbal or new supplements please let us know first to make sure it is safe.  Mouth Care Have teeth cleaned professionally before starting treatment. Keep dentures and partial plates clean. Use soft toothbrush and do not use mouthwashes that contain alcohol. Biotene is a good mouthwash that is available at most pharmacies or may be ordered by calling [phone]. Use warm salt water gargles (1 teaspoon salt per 1 quart warm water) before and after meals and at bedtime. If you  need dental work, please let the doctor know before you go for your appointment so that we can coordinate the best possible time for you in regards to your chemo regimen. You  need to also let your dentist know that you are actively taking chemo. We may need to do labs prior to your dental appointment.  Skin Care Always use sunscreen that has not expired and with SPF (Sun Protection Factor) of 50 or higher. Wear hats to protect your head from the sun. Remember to use sunscreen on your hands, ears, face, & feet.  Use good moisturizing lotions such as udder cream, eucerin, or even Vaseline. Some chemotherapies can cause dry skin, color changes in your skin and nails.    Avoid long, hot showers or baths. Use gentle, fragrance-free soaps and laundry detergent. Use moisturizers, preferably creams or ointments rather than lotions because the thicker consistency is better at preventing skin dehydration. Apply the cream or ointment within 15 minutes of showering. Reapply moisturizer at night, and moisturize your hands every time after you wash them.  Hair Loss (if your doctor says your hair will fall out)  If your doctor says that your hair is likely to fall out, decide before you begin chemo whether you want to wear a wig. You may want to shop before treatment to match your hair color. Hats, turbans, and scarves can also camouflage hair loss, although some people prefer to leave their heads uncovered. If you go bare-headed outdoors, be sure to use sunscreen on your scalp. Cut your hair short. It eases the inconvenience of shedding lots of hair, but it also can reduce the emotional impact of watching your hair fall out. Don't perm or color your hair during chemotherapy. Those chemical treatments are already damaging to hair and can enhance hair loss. Once your chemo treatments are done and your hair has grown back, it's OK to resume dyeing or perming hair.  With chemotherapy, hair loss is almost always  temporary. But when it grows back, it may be a different color or texture. In older adults who still had hair color before chemotherapy, the new growth may be completely gray.  Often, new hair is very fine and soft.  Infection Prevention Please wash your hands for at least 30 seconds using warm soapy water. Handwashing is the #1 way to prevent the spread of germs. Stay away from sick people or people who are getting over a cold. If you develop respiratory systems such as green/yellow mucus production or productive cough or persistent cough let us know and we will see if you need an antibiotic. It is a good idea to keep a pair of gloves on when going into grocery stores/[employer] to decrease your risk of coming into contact with germs on the carts, etc. Carry alcohol hand gel with you at all times and use it frequently if out in public. If your temperature reaches 100.4 or higher please call the clinic and let us know.  If it is after hours or on the weekend please go to the ER if your temperature is over 100.4.  Please have your own personal thermometer at home to use.    Sex and bodily fluids If you are going to have sex, a condom must be used to protect the person that isn't taking chemotherapy. Chemo can decrease your libido (sex drive). For a few days after chemotherapy, chemotherapy can be excreted through your bodily fluids.  When using the toilet please close the lid and flush the toilet twice.  Do this for a few day after you have had chemotherapy.   Effects of chemotherapy on your sex life Some changes are simple and won't last long.  They won't affect your sex life permanently.  Sometimes you may feel: too tired not strong enough to be very active sick or sore  not in the mood anxious or low  Your anxiety might not seem related to sex. For example, you may be worried about the cancer and how your treatment is going. Or you may be worried about money, or about how you family are coping with  your illness.  These things can cause stress, which can affect your interest in sex. It's important to talk to your partner about how you feel.  Remember - the changes to your sex life don't usually last long. There's usually no medical reason to stop having sex during chemo. The drugs won't have any long term physical effects on your performance or enjoyment of sex. Cancer can't be passed on to your partner during sex  Contraception It's important to use reliable contraception during treatment. Avoid getting pregnant while you or your partner are having chemotherapy. This is because the drugs may harm the baby. Sometimes chemotherapy drugs can leave a man or woman infertile.  This means you would not be able to have children in the future. You might want to talk to someone about permanent infertility. It can be very difficult to learn that you may no longer be able to have children. Some people find counselling helpful. There might be ways to preserve your fertility, although this is easier for men than for women. You may want to speak to a fertility expert. You can talk about sperm banking or harvesting your eggs. You can also ask about other fertility options, such as donor eggs. If you have or have had breast cancer, your doctor might advise you not to take the contraceptive pill. This is because the hormones in it might affect the cancer. It is not known for sure whether or not chemotherapy drugs can be passed on through semen or secretions from the vagina. Because of this some doctors advise people to use a barrier method if you have sex during treatment. This applies to vaginal, anal or oral sex. Generally, doctors advise a barrier method only for the time you are actually having the treatment and for about a week after your treatment. Advice like this can be worrying, but this does not mean that you have to avoid being intimate with your partner. You can still have close contact with your partner and  continue to enjoy sex.  Animals If you have cats or birds we just ask that you not change the litter or change the cage.  Please have someone else do this for you while you are on chemotherapy.   Food Safety During and After Cancer Treatment Food safety is important for people both during and after cancer treatment. Cancer and cancer treatments, such as chemotherapy, radiation therapy, and stem cell/bone marrow transplantation, often weaken the immune system. This makes it harder for your body to protect itself from foodborne illness, also called food poisoning. Foodborne illness is caused by eating food that contains harmful bacteria, parasites, or viruses.  Foods to avoid Some foods have a higher risk of becoming tainted with bacteria. These include: Unwashed fresh fruit and vegetables, especially leafy vegetables that can hide dirt and other contaminants Raw sprouts, such as alfalfa sprouts Raw or undercooked beef, especially ground beef, or other raw or undercooked meat and poultry Fatty, fried, or spicy foods immediately before or after treatment.  These can sit heavy on your stomach and make you  feel nauseous. Raw or undercooked shellfish, such as oysters. Sushi and sashimi, which often contain raw fish.  Unpasteurized beverages, such as unpasteurized fruit juices, raw milk, raw yogurt, or cider Undercooked eggs, such as soft boiled, over easy, and poached; raw, unpasteurized eggs; or foods made with raw egg, such as homemade raw cookie dough and homemade mayonnaise  Simple steps for food safety  Shop smart. Do not buy food stored or displayed in an unclean area. Do not buy bruised or damaged fruits or vegetables. Do not buy cans that have cracks, dents, or bulges. Pick up foods that can spoil at the end of your shopping trip and store them in a cooler on the way home.  Prepare and clean up foods carefully. Rinse all fresh fruits and vegetables under running water, and dry them with  a clean towel or paper towel. Clean the top of cans before opening them. After preparing food, wash your hands for 20 seconds with hot water and soap. Pay special attention to areas between fingers and under nails. Clean your utensils and dishes with hot water and soap. Disinfect your kitchen and cutting boards using 1 teaspoon of liquid, unscented bleach mixed into 1 quart of water.    Dispose of old food. Eat canned and packaged food before its expiration date (the "use by" or "best before" date). Consume refrigerated leftovers within 3 to 4 days. After that time, throw out the food. Even if the food does not smell or look spoiled, it still may be unsafe. Some bacteria, such as Listeria, can grow even on foods stored in the refrigerator if they are kept for too long.  Take precautions when eating out. At restaurants, avoid buffets and salad bars where food sits out for a long time and comes in contact with many people. Food can become contaminated when someone with a virus, often a norovirus, or another "bug" handles it. Put any leftover food in a "to-go" container yourself, rather than having the server do it. And, refrigerate leftovers as soon as you get home. Choose restaurants that are clean and that are willing to prepare your food as you order it cooked.   AT HOME MEDICATIONS:                                                                                                                                                                Compazine/Prochlorperazine 10mg  tablet. Take 1 tablet every 6 hours as needed for nausea/vomiting. (This can make you sleepy)   EMLA cream. Apply a quarter size amount to port site 1 hour prior to chemo. Do not rub in. Cover with plastic wrap.    Diarrhea Sheet   If you are having loose stools/diarrhea, please purchase Imodium and begin taking as outlined:  At the first sign of poorly formed or loose stools you should begin taking Imodium (loperamide)  2 mg capsules.  Take two tablets (4mg ) followed by one tablet (2mg ) every 2 hours - DO NOT EXCEED 8 tablets in 24 hours.  If it is bedtime and you are having loose stools, take 2 tablets at bedtime, then 2 tablets every 4 hours until morning.   Always call the [facility] if you are having loose stools/diarrhea that you can't get under control.  Loose stools/diarrhea leads to dehydration (loss of water) in your body.  We have other options of trying to get the loose stools/diarrhea to stop but you must let us know!   Constipation Sheet  Colace - 100 mg capsules - take 2 capsules daily.  If this doesn't help then you can increase to 2 capsules twice daily.  Please call if the above does not work for you. Do not go more than 2 days without a bowel movement.  It is very important that you do not become constipated.  It will make you feel sick to your stomach (nausea) and can cause abdominal pain and vomiting.  Nausea Sheet   Compazine/Prochlorperazine 10mg  tablet. Take 1 tablet every 6 hours as needed for nausea/vomiting (This can make you drowsy).  If you are having persistent nausea (nausea that does not stop) please call the [facility] and let us know the amount of nausea that you are experiencing.  If you begin to vomit, you need to call the [facility] and if it is the weekend and you have vomited more than one time and can't get it to stop-go to the Emergency Room.  Persistent nausea/vomiting can lead to dehydration (loss of fluid in your body) and will make you feel very weak and unwell. Ice chips, sips of clear liquids, foods that are at room temperature, crackers, and toast tend to be better tolerated.   SYMPTOMS TO REPORT AS SOON AS POSSIBLE AFTER TREATMENT:  FEVER GREATER THAN 100.4 F  CHILLS WITH OR WITHOUT FEVER  NAUSEA AND VOMITING THAT IS NOT CONTROLLED WITH YOUR NAUSEA MEDICATION  UNUSUAL SHORTNESS OF BREATH  UNUSUAL BRUISING OR BLEEDING  TENDERNESS IN MOUTH AND THROAT  WITH OR WITHOUT PRESENCE OF ULCERS  URINARY PROBLEMS  BOWEL PROBLEMS  UNUSUAL RASH      Wear comfortable clothing and clothing appropriate for easy access to any Portacath or PICC line. Let us know if there is anything that we can do to make your therapy better!    What to do if you need assistance after hours or on the weekends: CALL [phone].  HOLD on the line, do not hang up.  You will hear multiple messages but at the end you will be connected with a nurse triage line.  They will contact the doctor if necessary.  Most of the time they will be able to assist you.  Do not call the hospital operator.      I have been informed and understand all of the instructions given to me and have received a copy. I have been instructed to call the clinic [phone] or my family physician as soon as possible for continued medical care, if indicated. I do not have any more questions at this time but understand that I may call the [facility] or the Patient Navigator at [phone] during office hours should I have questions or need assistance in obtaining follow-up care.

## 2021-07-21 ENCOUNTER — Ambulatory Visit (HOSPITAL_COMMUNITY)
Admission: RE | Admit: 2021-07-21 | Discharge: 2021-07-21 | Disposition: A | Discharge status: Home / Self Care | Payer: Medicare Other | Source: Ambulatory Visit | Attending: Hematology | Admitting: Hematology

## 2021-07-21 ENCOUNTER — Other Ambulatory Visit: Payer: Self-pay

## 2021-07-21 ENCOUNTER — Inpatient Hospital Stay (HOSPITAL_COMMUNITY): Payer: Medicare Other

## 2021-07-21 ENCOUNTER — Other Ambulatory Visit: Payer: Self-pay | Admitting: Radiology

## 2021-07-21 ENCOUNTER — Encounter (HOSPITAL_COMMUNITY): Payer: Self-pay

## 2021-07-21 DIAGNOSIS — Z08 Encounter for follow-up examination after completed treatment for malignant neoplasm: Secondary | ICD-10-CM

## 2021-07-21 DIAGNOSIS — C221 Intrahepatic bile duct carcinoma: Secondary | ICD-10-CM | POA: Diagnosis not present

## 2021-07-21 DIAGNOSIS — E119 Type 2 diabetes mellitus without complications: Secondary | ICD-10-CM | POA: Insufficient documentation

## 2021-07-21 DIAGNOSIS — F17210 Nicotine dependence, cigarettes, uncomplicated: Secondary | ICD-10-CM | POA: Insufficient documentation

## 2021-07-21 DIAGNOSIS — Z95828 Presence of other vascular implants and grafts: Secondary | ICD-10-CM

## 2021-07-21 HISTORY — PX: IR IMAGING GUIDED PORT INSERTION: IMG5740

## 2021-07-21 LAB — GLUCOSE, CAPILLARY: Glucose-Capillary: 125 mg/dL — ABNORMAL HIGH (ref 70–99)

## 2021-07-21 MED ORDER — PROCHLORPERAZINE MALEATE 10 MG PO TABS: 10.0000 mg | ORAL_TABLET | Freq: Four times a day (QID) | ORAL | 3 refills | Status: DC | PRN

## 2021-07-21 MED ORDER — MIDAZOLAM HCL 2 MG/2ML IJ SOLN
INTRAMUSCULAR | Status: AC
  Filled 2021-07-21: qty 2

## 2021-07-21 MED ORDER — LIDOCAINE-EPINEPHRINE 1 %-1:100000 IJ SOLN
INTRAMUSCULAR | Status: AC
  Filled 2021-07-21: qty 1

## 2021-07-21 MED ORDER — LIDOCAINE-PRILOCAINE 2.5-2.5 % EX CREA: TOPICAL_CREAM | CUTANEOUS | 3 refills | Status: DC

## 2021-07-21 MED ORDER — SODIUM CHLORIDE 0.9 % IV SOLN: INTRAVENOUS | Status: DC

## 2021-07-21 MED ORDER — FENTANYL CITRATE (PF) 100 MCG/2ML IJ SOLN
INTRAMUSCULAR | Status: DC | PRN
  Administered 2021-07-21 (×2): 50 ug via INTRAVENOUS

## 2021-07-21 MED ORDER — LIDOCAINE-EPINEPHRINE 1 %-1:100000 IJ SOLN
INTRAMUSCULAR | Status: DC | PRN
  Administered 2021-07-21: 10 mL
  Administered 2021-07-21: 15 mL

## 2021-07-21 MED ORDER — MIDAZOLAM HCL 2 MG/2ML IJ SOLN
INTRAMUSCULAR | Status: DC | PRN
  Administered 2021-07-21 (×2): 1 mg via INTRAVENOUS

## 2021-07-21 MED ORDER — HEPARIN SOD (PORK) LOCK FLUSH 100 UNIT/ML IV SOLN
INTRAVENOUS | Status: AC
  Filled 2021-07-21: qty 5

## 2021-07-21 MED ORDER — HEPARIN SOD (PORK) LOCK FLUSH 100 UNIT/ML IV SOLN
INTRAVENOUS | Status: DC | PRN
  Administered 2021-07-21: 500 [IU] via INTRAVENOUS

## 2021-07-21 MED ORDER — FENTANYL CITRATE (PF) 100 MCG/2ML IJ SOLN
INTRAMUSCULAR | Status: AC
  Filled 2021-07-21: qty 2

## 2021-07-21 NOTE — Procedures (Signed)
Interventional Radiology Procedure Note  Procedure: Chest port  Indication: Cholangiocarcinoma  Findings: Please refer to procedural dictation for full description.  Complications: None  EBL: < 10 mL  Miachel Roux, MD [phone]

## 2021-07-21 NOTE — Progress Notes (Signed)
Per Wynelle Link, PA she stated that patient can resume Lovenox tonight. Patient and family made aware.

## 2021-07-21 NOTE — Progress Notes (Signed)

## 2021-07-21 NOTE — H&P (Signed)
Chief Complaint: Patient was seen in consultation today for port a cath placement at the request of Katragadda,Sreedhar  Referring Physician(s): Katragadda,Sreedhar  Supervising Physician: Mir, Biochemist, clinical  [facility] Status: [facility] - [facility]  History of Present Illness: [facility] is a 65 y.o. male   New dx cholangiocarcinoma 02/2021 Follows with Dr Wayne Sever loss Appetite is low; energy failing Smoker  Dr Raliegh Ip note: Metastatic cholangiocarcinoma: - 02/2021 initial diagnosis presentation with painless jaundice - ERCP with sphincterotomy, subsequent ERCP with plastic biliary stent placement. - MRI-2 cm mass involving the common hepatic duct with upstream biliary dilatation. - 03/2021 consult at [facility] with GI and surgery-confirm diagnosis of perihilar cholangiocarcinoma - 03/21/2021-biopsy/pathology, ERCP-common hepatic duct cytology and brushings performed.  EUS-1 enlarged lymph node versus tumor implant in the gastrohepatic ligament-negative for malignancy.  Single biliary plastic stent removed and replaced with the plastic stent in the right and left ducts. - 04/17/2021-right portal vein embolization to promote left hepatic hypertrophy. - 07/02/2021-diagnostic laparoscopy with multiple areas concerning for metastatic blocks identified in the abdomen and peritoneal surfaces.  6 separate biopsies obtained. - Pathology consistent with metastatic carcinoma. - 37 pound weight loss in the last 5 months.  Scheduled today for PAC placement To begin chemo tomorrow   Past Medical History:  Diagnosis Date   Chronic pain    neck, back, knees   Class 1 obesity due to excess calories with body mass index (BMI) of 31.0 to 31.9 in adult    Claudication [facility])    DDD (degenerative disc disease), cervical    Diabetes mellitus ([place])    Dyslipidemia    Hypertension    Port-A-Cath in place 07/20/2021   Tobacco dependence     Past Surgical History:  Procedure Laterality Date   BILIARY  BRUSHING N/A 02/25/2021   Procedure: BILIARY BRUSHING;  Surgeon: Rogene Houston, MD;  Location: [facility];  Service: Gastroenterology;  Laterality: N/A;   BILIARY STENT PLACEMENT N/A 02/25/2021   Procedure: BILIARY STENT PLACEMENT;  Surgeon: Rogene Houston, MD;  Location: [facility];  Service: Gastroenterology;  Laterality: N/A;   BILIARY STENT PLACEMENT  02/27/2021   Procedure: BILIARY STENT PLACEMENT (10FR x 9cm) IN THE RIGHT SYSTEM;  Surgeon: Rogene Houston, MD;  Location: [facility];  Service: Gastroenterology;;   BREAST SURGERY Left    benign lump- in his 46s.   ERCP N/A 02/25/2021   Procedure: ENDOSCOPIC RETROGRADE CHOLANGIOPANCREATOGRAPHY (ERCP);  Surgeon: Rogene Houston, MD;  Location: [facility];  Service: Gastroenterology;  Laterality: N/A;   ERCP N/A 02/27/2021   Procedure: ENDOSCOPIC RETROGRADE CHOLANGIOPANCREATOGRAPHY (ERCP);  Surgeon: Rogene Houston, MD;  Location: [facility];  Service: Gastroenterology;  Laterality: N/A;   KNEE SURGERY Left    x2   SPHINCTEROTOMY N/A 02/25/2021   Procedure: SPHINCTEROTOMY;  Surgeon: Rogene Houston, MD;  Location: [facility];  Service: Gastroenterology;  Laterality: N/A;   STENT REMOVAL  02/27/2021   Procedure: STENT REMOVAL (8.5Fr x 9cm);  Surgeon: Rogene Houston, MD;  Location: [facility];  Service: Gastroenterology;;    Allergies: Patient has no known allergies.  Medications: Prior to Admission medications   Medication Sig Start Date End Date Taking? Authorizing Provider  acetaminophen (TYLENOL) 500 MG tablet Take 1,000 mg by mouth every 6 (six) hours as needed for moderate pain or headache.   Yes [provider]  enoxaparin (LOVENOX) 40 MG/0.4ML injection Inject 40 mg into the skin at bedtime. 07/05/21 08/02/21 Yes [provider]  famotidine (PEPCID) 20  MG tablet Take 20 mg by mouth daily.   Yes [provider]  ferrous sulfate (FERROUSUL) 325 (65 FE) MG tablet Take 1 tablet (325 mg total) by mouth daily with breakfast. 03/18/21   Yes Rehman, Mechele Dawley, MD  metFORMIN (GLUCOPHAGE) 500 MG tablet Take 1 tablet (500 mg total) by mouth 2 (two) times daily with a meal. 05/29/21 07/21/21 Yes Dessa Phi, DO  oxyCODONE (OXY IR/ROXICODONE) 5 MG immediate release tablet Take 5 mg by mouth every 4 (four) hours as needed for severe pain. 07/05/21  Yes [provider]  CISPLATIN IV Inject into the vein once a week. Days 1, 8 every 21 days 07/22/21   [provider]  Durvalumab (IMFINZI IV) Inject into the vein every 21 ( twenty-one) days. 07/22/21   [provider]  Gemcitabine HCl (GEMZAR IV) Inject into the vein once a week. Days 1, 8 every 21 days 07/22/21   [provider]  lidocaine-prilocaine (EMLA) cream Apply a small amount to port a cath site and cover with plastic wrap 1 hour prior to chemotherapy appointments 07/21/21   Derek Jack, MD  megestrol (MEGACE) 400 MG/10ML suspension Take 10 mLs (400 mg total) by mouth 2 (two) times daily. 07/17/21   Derek Jack, MD  prochlorperazine (COMPAZINE) 10 MG tablet Take 1 tablet (10 mg total) by mouth every 6 (six) hours as needed (Nausea or vomiting). 07/21/21   Derek Jack, MD     Family History  Problem Relation Age of Onset   CAD Mother    Cancer Neg Hx     Social History   Socioeconomic History   Marital status: Married    Spouse name: Not on file   Number of children: Not on file   Years of education: Not on file   Highest education level: Not on file  Occupational History   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  Tobacco Use   Smoking status: Every Day    Packs/day: 0.50    Years: 56.00    Pack years: 28.00    Types: Cigarettes   Smokeless tobacco: Never  Vaping Use   Vaping Use: Never used  Substance and Sexual Activity   Alcohol use: Not Currently    Comment: stopped 2.5 years ago 03/11/21   Drug use: Never   Sexual activity: Not on file  Other Topics Concern   Not on file  Social History  Narrative   Not on file   Social Determinants of Health   Financial Resource Strain: Medium Risk   Difficulty of Paying Living Expenses: Somewhat hard  Food Insecurity: No Food Insecurity   Worried About Charity fundraiser in the Last Year: Never true   Ran Out of Food in the Last Year: Never true  Transportation Needs: No Transportation Needs   Lack of Transportation (Medical): No   Lack of Transportation (Non-Medical): No  Physical Activity: Insufficiently Active   Days of Exercise per Week: 2 days   Minutes of Exercise per Session: 20 min  Stress: No Stress Concern Present   Feeling of Stress : Only a little  Social Connections: Moderately Integrated   Frequency of Communication with Friends and Family: More than three times a week   Frequency of Social Gatherings with Friends and Family: More than three times a week   Attends Religious Services: More than 4 times per year   Active Member of [employer] or Organizations: No   Attends Archivist Meetings: Never   Marital Status: Married  Review of Systems: A 12 point ROS discussed and pertinent positives are indicated in the HPI above.  All other systems are negative.  Review of Systems  Constitutional:  Positive for activity change, fatigue and unexpected weight change.  Respiratory:  Negative for cough and shortness of breath.   Cardiovascular:  Negative for chest pain.  Gastrointestinal:  Positive for abdominal pain.  Musculoskeletal:  Positive for back pain.  Neurological:  Positive for weakness.  Psychiatric/Behavioral:  Negative for behavioral problems and confusion.    Vital Signs: BP 140/87 (BP Location: Right Arm)   Pulse (!) 108   Temp 98.1 F (36.7 C) (Oral)   Ht 5\' 11"  (1.803 m)   Wt 162 lb (73.5 kg)   SpO2 100%   BMI 22.59 kg/m   Physical Exam Constitutional:      Appearance: He is ill-appearing.  HENT:     Mouth/Throat:     Mouth: Mucous membranes are moist.  Cardiovascular:     Rate and  Rhythm: Normal rate and regular rhythm.     Heart sounds: Normal heart sounds.  Pulmonary:     Effort: Pulmonary effort is normal.     Breath sounds: Normal breath sounds.  Abdominal:     Tenderness: There is no abdominal tenderness.  Musculoskeletal:        General: Normal range of motion.  Skin:    General: Skin is warm.  Neurological:     Mental Status: He is alert and oriented to person, place, and time.  Psychiatric:        Behavior: Behavior normal.    Imaging: No results found.  Labs:  CBC: Recent Labs    05/28/21 0427 05/29/21 0518 06/16/21 0925 07/17/21 1432  WBC 7.3 6.3 8.3 12.2*  HGB 11.2* 11.7* 12.1* 12.8*  HCT 33.4* 35.6* 36.6* 39.0  PLT 217 278 261 390    COAGS: Recent Labs    02/27/21 1642 03/12/21 0518 04/28/21 1243 05/25/21 1412  INR 1.0 1.2 1.1 1.0  APTT  --  33 30 29    BMP: Recent Labs    05/27/21 0432 05/28/21 0427 05/29/21 0518 06/16/21 0925 07/17/21 1432  NA 133* 131* 133* 136 134*  K 3.7 3.7 4.4 4.4 4.9  CL 103 101 103 98 102  CO2 24 24 23 24 23   GLUCOSE 103* 110* 148* 136* 139*  BUN 11 10 15 12 22   CALCIUM 8.4* 8.6* 8.9 9.9 9.9  CREATININE 0.68 0.65 0.63 0.69* 0.99  GFRNONAA >60 >60 >60  --  >60    LIVER FUNCTION TESTS: Recent Labs    05/29/21 0518 06/04/21 1000 06/16/21 0925 07/17/21 1432  BILITOT 5.4* 3.3* 1.7* 4.3*  AST 85* 66* 68* 74*  ALT 88* 101* 61* 112*  ALKPHOS 607* 764* 454* 651*  PROT 6.7 6.9 6.8 8.0  ALBUMIN 2.3* 3.7* 3.7* 3.2*    TUMOR MARKERS: No results for input(s): AFPTM, CEA, CA199, CHROMGRNA in the last 8760 hours.  Assessment and Plan:  Cholangiocarcinoma For chemo to start tomorrow PAC placement today Risks and benefits of image guided port-a-catheter placement was discussed with the patient including, but not limited to bleeding, infection, pneumothorax, or fibrin sheath development and need for additional procedures.  All of the patient's questions were answered, patient is  agreeable to proceed. Consent signed and in chart.   Thank you for this interesting consult.  I greatly enjoyed meeting BRAXEN DOBEK and look forward to participating in their care.  A copy of this  report was sent to the requesting provider on this date.  Electronically Signed: Lavonia Drafts, PA-C 07/21/2021, 1:29 PM   I spent a total of  30 Minutes   in face to face in clinical consultation, greater than 50% of which was counseling/coordinating care for [facility]

## 2021-07-22 ENCOUNTER — Inpatient Hospital Stay (HOSPITAL_COMMUNITY): Payer: Medicare Other

## 2021-07-22 ENCOUNTER — Inpatient Hospital Stay (HOSPITAL_COMMUNITY): Payer: Medicare Other | Admitting: General Practice

## 2021-07-22 ENCOUNTER — Encounter (HOSPITAL_COMMUNITY): Payer: Self-pay | Admitting: Hematology

## 2021-07-22 VITALS — BP 139/81 | HR 100 | Temp 96.8°F | Resp 18 | Ht 71.0 in | Wt 158.7 lb

## 2021-07-22 DIAGNOSIS — Z5112 Encounter for antineoplastic immunotherapy: Secondary | ICD-10-CM | POA: Diagnosis not present

## 2021-07-22 DIAGNOSIS — C221 Intrahepatic bile duct carcinoma: Secondary | ICD-10-CM

## 2021-07-22 DIAGNOSIS — Z95828 Presence of other vascular implants and grafts: Secondary | ICD-10-CM

## 2021-07-22 LAB — COMPREHENSIVE METABOLIC PANEL
ALT: 98 U/L — ABNORMAL HIGH (ref 0–44)
AST: 81 U/L — ABNORMAL HIGH (ref 15–41)
Albumin: 3.1 g/dL — ABNORMAL LOW (ref 3.5–5.0)
Alkaline Phosphatase: 623 U/L — ABNORMAL HIGH (ref 38–126)
Anion gap: 10 (ref 5–15)
BUN: 21 mg/dL (ref 8–23)
CO2: 24 mmol/L (ref 22–32)
Calcium: 9.6 mg/dL (ref 8.9–10.3)
Chloride: 98 mmol/L (ref 98–111)
Creatinine, Ser: 1.05 mg/dL (ref 0.61–1.24)
GFR, Estimated: >60 mL/min (ref >60)
Glucose, Bld: 152 mg/dL — ABNORMAL HIGH (ref 70–99)
Potassium: 4.3 mmol/L (ref 3.5–5.1)
Sodium: 132 mmol/L — ABNORMAL LOW (ref 135–145)
Total Bilirubin: 3.2 mg/dL — ABNORMAL HIGH (ref 0.3–1.2)
Total Protein: 7.6 g/dL (ref 6.5–8.1)

## 2021-07-22 LAB — CBC WITH DIFFERENTIAL/PLATELET
Abs Immature Granulocytes: 0.02 10*3/uL (ref 0.00–0.07)
Basophils Absolute: 0.1 10*3/uL (ref 0.0–0.1)
Basophils Relative: 1 %
Eosinophils Absolute: 0.2 10*3/uL (ref 0.0–0.5)
Eosinophils Relative: 3 %
HCT: 36.6 % — ABNORMAL LOW (ref 39.0–52.0)
Hemoglobin: 12.3 g/dL — ABNORMAL LOW (ref 13.0–17.0)
Immature Granulocytes: 0 %
Lymphocytes Relative: 21 %
Lymphs Abs: 1.7 10*3/uL (ref 0.7–4.0)
MCH: 32 pg (ref 26.0–34.0)
MCHC: 33.6 g/dL (ref 30.0–36.0)
MCV: 95.3 fL (ref 80.0–100.0)
Monocytes Absolute: 0.8 10*3/uL (ref 0.1–1.0)
Monocytes Relative: 10 %
Neutro Abs: 5.4 10*3/uL (ref 1.7–7.7)
Neutrophils Relative %: 65 %
Platelets: 277 10*3/uL (ref 150–400)
RBC: 3.84 MIL/uL — ABNORMAL LOW (ref 4.22–5.81)
RDW: 14.4 % (ref 11.5–15.5)
WBC: 8.3 10*3/uL (ref 4.0–10.5)
nRBC: 0 % (ref 0.0–0.2)

## 2021-07-22 LAB — MAGNESIUM: Magnesium: 2.1 mg/dL (ref 1.7–2.4)

## 2021-07-22 MED ORDER — MAGNESIUM SULFATE 2 GM/50ML IV SOLN
2.0000 g | Freq: Once | INTRAVENOUS | Status: AC
  Administered 2021-07-22: 2 g via INTRAVENOUS
  Filled 2021-07-22: qty 50

## 2021-07-22 MED ORDER — SODIUM CHLORIDE 0.9 % IV SOLN
500.0000 mg/m2 | Freq: Once | INTRAVENOUS | Status: AC
  Administered 2021-07-22: 950 mg via INTRAVENOUS
  Filled 2021-07-22: qty 24.98

## 2021-07-22 MED ORDER — SODIUM CHLORIDE 0.9% FLUSH
10.0000 mL | INTRAVENOUS | Status: DC | PRN
  Administered 2021-07-22: 10 mL

## 2021-07-22 MED ORDER — HEPARIN SOD (PORK) LOCK FLUSH 100 UNIT/ML IV SOLN
500.0000 [IU] | Freq: Once | INTRAVENOUS | Status: AC | PRN
  Administered 2021-07-22: 500 [IU]

## 2021-07-22 MED ORDER — SODIUM CHLORIDE 0.9 % IV SOLN
1500.0000 mg | Freq: Once | INTRAVENOUS | Status: AC
  Administered 2021-07-22: 1500 mg via INTRAVENOUS
  Filled 2021-07-22: qty 30

## 2021-07-22 MED ORDER — SODIUM CHLORIDE 0.9 % IV SOLN
25.0000 mg/m2 | Freq: Once | INTRAVENOUS | Status: AC
  Administered 2021-07-22: 48 mg via INTRAVENOUS
  Filled 2021-07-22: qty 48

## 2021-07-22 MED ORDER — SODIUM CHLORIDE 0.9 % IV SOLN
10.0000 mg | Freq: Once | INTRAVENOUS | Status: AC
  Administered 2021-07-22: 10 mg via INTRAVENOUS
  Filled 2021-07-22: qty 10

## 2021-07-22 MED ORDER — POTASSIUM CHLORIDE IN NACL 20-0.9 MEQ/L-% IV SOLN
Freq: Once | INTRAVENOUS | Status: AC
  Filled 2021-07-22: qty 1000

## 2021-07-22 MED ORDER — SODIUM CHLORIDE 0.9 % IV SOLN: Freq: Once | INTRAVENOUS | Status: AC

## 2021-07-22 MED ORDER — OXYCODONE HCL 5 MG PO TABS
5.0000 mg | ORAL_TABLET | Freq: Once | ORAL | Status: AC
  Administered 2021-07-22: 5 mg via ORAL
  Filled 2021-07-22: qty 1

## 2021-07-22 MED ORDER — SODIUM CHLORIDE 0.9 % IV SOLN
150.0000 mg | Freq: Once | INTRAVENOUS | Status: AC
  Administered 2021-07-22: 150 mg via INTRAVENOUS
  Filled 2021-07-22: qty 150

## 2021-07-22 MED ORDER — PALONOSETRON HCL INJECTION 0.25 MG/5ML
0.2500 mg | Freq: Once | INTRAVENOUS | Status: AC
  Administered 2021-07-22: 0.25 mg via INTRAVENOUS
  Filled 2021-07-22: qty 5

## 2021-07-22 NOTE — Progress Notes (Signed)
Patients port flushed without difficulty.  Good blood return noted with no bruising or swelling noted at site.  PORT was placed 07/21/21 and patient is having a lot of pain around this site.  MD notified and pain medication ordered and given.  Stable during access and blood draw.  Patient to remain accessed for treatment.

## 2021-07-22 NOTE — Progress Notes (Signed)
[facility]  Initial Assessment   Jonathan Keith is a 65 y.o. year old male presenting alone. Clinical Social Keith was referred by medical oncology for assessment of psychosocial needs.   SDOH (Social Determinants of Health) assessments performed: Yes   Distress Screen completed: No No flowsheet data found.    Family/Social Information:  Housing Arrangement: patient lives with wife in own home Family members/support persons in your life? Family -wife is primary caregiver, also has support from son and daughter and grandchildren.   Transportation concerns: no  Employment: Retired and Disabled. Had no plans to retire - became unable to Keith due to fatigue and cancer diagnosis.  Loved his Keith, forced retirement due to illness has been challenging Income source: Paediatric nurse concerns: No Type of concern: None; wife handles all financial matters, he is not aware of any issues related to finances.   Food access concerns: no Religious or spiritual practice: did not assess, did not mention Medication Concerns: no  Services Currently in place:  none  Coping/ Adjustment to diagnosis: Patient understands treatment plan and what happens next? yes, diagnosed with Stage IV intrahepatic cholangiocarcinoma.  Has undergone surgery, first chemotherapy today.  Major concern is fatigue/lack of energy to do  much of anything.  Hopes chemotherapy will alleviate some of this.  Concerns about diagnosis and/or treatment:  fatigue, inability to do things he likes to do (house projects and similar) Patient reported stressors: Physical issues Hopes and priorities: increase in energy Patient enjoys time with family/ friends and house projects.   Current coping skills/ strengths: Supportive family/friends     SUMMARY: Current SDOH Barriers:  None noted  Interventions: Discussed common feeling and emotions when being diagnosed with cancer, and the importance of  support during treatment Informed patient of the support team roles and support services at [facility] Provided CSW contact information and encouraged patient to call with any questions or concerns Provided patient with information about Patient and [facility], encouraged him/wife to reach out as needed.     Follow Up Plan: Patient will contact CSW with any support or resource needs Patient verbalizes understanding of plan: Yes    Hobucken Keith, [place], [place] Social Worker Phone:  [phone]

## 2021-07-22 NOTE — Patient Instructions (Signed)
[place]  Discharge Instructions: Thank you for choosing [facility] to provide your oncology and hematology care.  If you have a lab appointment with the [facility], please come in thru the Main Entrance and check in at the main information desk.  Wear comfortable clothing and clothing appropriate for easy access to any Portacath or PICC line.   We strive to give you quality time with your provider. You may need to reschedule your appointment if you arrive late (15 or more minutes).  Arriving late affects you and other patients whose appointments are after yours.  Also, if you miss three or more appointments without notifying the office, you may be dismissed from the clinic at the providers discretion.      For prescription refill requests, have your pharmacy contact our office and allow 72 hours for refills to be completed.    Today you received the following chemotherapy and/or immunotherapy agents Imfinzi, Gemzar, and Cisplatin      To help prevent nausea and vomiting after your treatment, we encourage you to take your nausea medication as directed.  BELOW ARE SYMPTOMS THAT SHOULD BE REPORTED IMMEDIATELY: *FEVER GREATER THAN 100.4 F (38 C) OR HIGHER *CHILLS OR SWEATING *NAUSEA AND VOMITING THAT IS NOT CONTROLLED WITH YOUR NAUSEA MEDICATION *UNUSUAL SHORTNESS OF BREATH *UNUSUAL BRUISING OR BLEEDING *URINARY PROBLEMS (pain or burning when urinating, or frequent urination) *BOWEL PROBLEMS (unusual diarrhea, constipation, pain near the anus) TENDERNESS IN MOUTH AND THROAT WITH OR WITHOUT PRESENCE OF ULCERS (sore throat, sores in mouth, or a toothache) UNUSUAL RASH, SWELLING OR PAIN  UNUSUAL VAGINAL DISCHARGE OR ITCHING   Items with * indicate a potential emergency and should be followed up as soon as possible or go to the Emergency Department if any problems should occur.  Please show the CHEMOTHERAPY ALERT CARD or IMMUNOTHERAPY ALERT CARD at check-in to  the Emergency Department and triage nurse.  Should you have questions after your visit or need to cancel or reschedule your appointment, please contact [facility] [phone]  and follow the prompts.  Office hours are 8:00 a.m. to 4:30 p.m. Monday - Friday. Please note that voicemails left after 4:00 p.m. may not be returned until the following business day.  We are closed weekends and major holidays. You have access to a nurse at all times for urgent questions. Please call the main number to the clinic [phone] and follow the prompts.  For any non-urgent questions, you may also contact your provider using MyChart. We now offer e-Visits for anyone 34 and older to request care online for non-urgent symptoms. For details visit [email or web address].   Also download the MyChart app! Go to the app store, search "MyChart", open the app, select [facility], and log in with your MyChart username and password.  Due to Covid, a mask is required upon entering the hospital/clinic. If you do not have a mask, one will be given to you upon arrival. For doctor visits, patients may have 1 support person aged 55 or older with them. For treatment visits, patients cannot have anyone with them due to current Covid guidelines and our immunocompromised population.

## 2021-07-22 NOTE — Progress Notes (Signed)
Patient presents today for Imfinzi Cisplatin and Gemzar infusions per providers order.  Vital signs within parameters for treatment.  Labs pending.  Patients only complaint is pain at the PORT site where it was placed 07/21/21.    AST 81, ALT 98, and total bili 3.2, notified Katragadda, okay to proceed with treatment, Gemzar will be dose reduced.  Patient has voided 300 cc of urine.  Patient will return tomorrow for one liter of house fluids over over two hours.  Imfinzi, Cisplatin, Gemzar infusions given today per MD orders.  Stable during infusion without adverse affects.  Vital signs stable.  No complaints at this time.  Discharge from clinic via wheelchair in stable condition.  Alert and oriented X 3.  Follow up with [facility] as scheduled.

## 2021-07-22 NOTE — Addendum Note (Signed)
Addended by: Derek Jack on: 07/22/2021 09:26 AM   Modules accepted: Orders

## 2021-07-23 ENCOUNTER — Encounter (HOSPITAL_COMMUNITY): Payer: Self-pay

## 2021-07-23 ENCOUNTER — Inpatient Hospital Stay (HOSPITAL_COMMUNITY): Payer: Medicare Other

## 2021-07-23 ENCOUNTER — Other Ambulatory Visit (HOSPITAL_COMMUNITY): Payer: Self-pay | Admitting: *Deleted

## 2021-07-23 VITALS — BP 143/78 | HR 75 | Temp 97.1°F | Resp 18

## 2021-07-23 DIAGNOSIS — Z5112 Encounter for antineoplastic immunotherapy: Secondary | ICD-10-CM | POA: Diagnosis not present

## 2021-07-23 DIAGNOSIS — C249 Malignant neoplasm of biliary tract, unspecified: Secondary | ICD-10-CM

## 2021-07-23 DIAGNOSIS — E860 Dehydration: Secondary | ICD-10-CM

## 2021-07-23 MED ORDER — HEPARIN SOD (PORK) LOCK FLUSH 100 UNIT/ML IV SOLN
500.0000 [IU] | Freq: Once | INTRAVENOUS | Status: AC | PRN
  Administered 2021-07-23: 11:00:00 500 [IU]

## 2021-07-23 MED ORDER — POTASSIUM CHLORIDE IN NACL 20-0.9 MEQ/L-% IV SOLN
Freq: Once | INTRAVENOUS | Status: AC
  Filled 2021-07-23: qty 1000

## 2021-07-23 MED ORDER — SODIUM CHLORIDE 0.9% FLUSH
10.0000 mL | Freq: Once | INTRAVENOUS | Status: AC | PRN
  Administered 2021-07-23: 11:00:00 10 mL

## 2021-07-23 MED ORDER — METHYLPHENIDATE HCL 5 MG PO TABS: 5.0000 mg | ORAL_TABLET | Freq: Every day | ORAL | 0 refills | Status: DC

## 2021-07-23 MED ORDER — MAGNESIUM SULFATE 2 GM/50ML IV SOLN
2.0000 g | Freq: Once | INTRAVENOUS | Status: AC
  Administered 2021-07-23: 09:00:00 2 g via INTRAVENOUS

## 2021-07-23 NOTE — Progress Notes (Signed)
Patient presents today for house fluids. Vital signs stable. 24 hour follow up after D1C1 . Patient denies any side effects from his treatment yesterday. Patient has complaints of port site being sore. No abnormal bruising, redness, or drainage noted. Patient informed that Ritalin script was sent per A Anderson RN/ Dr. Delton Coombes for energy. Understanding verbalized and patient teaching performed to take one pill daily in the morning.   IV fluids given today per MD orders. Tolerated infusion without adverse affects. Vital signs stable. No complaints at this time. Discharged from clinic ambulatory in stable condition. Alert and oriented x 3. F/U with [facility] as scheduled.

## 2021-07-23 NOTE — Patient Instructions (Signed)
[facility]  Discharge Instructions: Thank you for choosing [facility] to provide your oncology and hematology care.  If you have a lab appointment with the [facility], please come in thru the Main Entrance and check in at the main information desk.  Wear comfortable clothing and clothing appropriate for easy access to any Portacath or PICC line.   We strive to give you quality time with your provider. You may need to reschedule your appointment if you arrive late (15 or more minutes).  Arriving late affects you and other patients whose appointments are after yours.  Also, if you miss three or more appointments without notifying the office, you may be dismissed from the clinic at the providers discretion.      For prescription refill requests, have your pharmacy contact our office and allow 72 hours for refills to be completed.    Today you received the following : House fluids.       To help prevent nausea and vomiting after your treatment, we encourage you to take your nausea medication as directed.  BELOW ARE SYMPTOMS THAT SHOULD BE REPORTED IMMEDIATELY: *FEVER GREATER THAN 100.4 F (38 C) OR HIGHER *CHILLS OR SWEATING *NAUSEA AND VOMITING THAT IS NOT CONTROLLED WITH YOUR NAUSEA MEDICATION *UNUSUAL SHORTNESS OF BREATH *UNUSUAL BRUISING OR BLEEDING *URINARY PROBLEMS (pain or burning when urinating, or frequent urination) *BOWEL PROBLEMS (unusual diarrhea, constipation, pain near the anus) TENDERNESS IN MOUTH AND THROAT WITH OR WITHOUT PRESENCE OF ULCERS (sore throat, sores in mouth, or a toothache) UNUSUAL RASH, SWELLING OR PAIN  UNUSUAL VAGINAL DISCHARGE OR ITCHING   Items with * indicate a potential emergency and should be followed up as soon as possible or go to the Emergency Department if any problems should occur.  Please show the CHEMOTHERAPY ALERT CARD or IMMUNOTHERAPY ALERT CARD at check-in to the Emergency Department and triage nurse.  Should  you have questions after your visit or need to cancel or reschedule your appointment, please contact [facility] [phone]  and follow the prompts.  Office hours are 8:00 a.m. to 4:30 p.m. Monday - Friday. Please note that voicemails left after 4:00 p.m. may not be returned until the following business day.  We are closed weekends and major holidays. You have access to a nurse at all times for urgent questions. Please call the main number to the clinic [phone] and follow the prompts.  For any non-urgent questions, you may also contact your provider using MyChart. We now offer e-Visits for anyone 16 and older to request care online for non-urgent symptoms. For details visit [email or web address].   Also download the MyChart app! Go to the app store, search "MyChart", open the app, select [facility], and log in with your MyChart username and password.  Due to Covid, a mask is required upon entering the hospital/clinic. If you do not have a mask, one will be given to you upon arrival. For doctor visits, patients may have 1 support person aged 69 or older with them. For treatment visits, patients cannot have anyone with them due to current Covid guidelines and our immunocompromised population.

## 2021-07-28 NOTE — Progress Notes (Signed)
[place] [place], [place] [address]   CLINIC:  Medical Oncology/Hematology  PCP:  [name], [place] / [place] [address] [phone]   REASON FOR VISIT:  Follow-up for intrahepatic cholangiocarcinoma  PRIOR THERAPY: none  NGS Results: not done  CURRENT THERAPY: surveillance  BRIEF ONCOLOGIC HISTORY:  Oncology History  Cholangiocarcinoma ([place])  02/28/2021 Initial Diagnosis   Cholangiocarcinoma ([place])   07/17/2021 Cancer Staging   Staging form: Perihilar Bile Ducts, AJCC 8th Edition - Clinical stage from 07/17/2021: Stage IVB (cTX, cNX, pM1) - Signed by Derek Jack, MD on 07/17/2021 Stage prefix: Initial diagnosis    07/22/2021 -  Chemotherapy   Patient is on Treatment Plan : BILIARY TRACT Cisplatin + Gemcitabine D1,8 q21d       CANCER STAGING:  Cancer Staging  Cholangiocarcinoma ([place]) Staging form: Perihilar Bile Ducts, AJCC 8th Edition - Clinical stage from 07/17/2021: Stage IVB (cTX, cNX, pM1) - Signed by Derek Jack, MD on 07/17/2021   INTERVAL HISTORY:  [name], a 65 y.o. male, returns for routine follow-up and consideration for next cycle of chemotherapy. [name] was last seen on 07/17/2021.  Due for day #8 cycle #1 of Cisplatin + Gemcitabine today.   Overall, he tells me he has been feeling pretty well. He reports cough, rhinorrhea, and fatigue. He drinks 48 ounces of water daily. His appetite has improved. He denies vomiting, diarrhea, and he reports weakness in his legs. He reports slight nausea when eating.   Overall, he feels ready for next cycle of chemo today.    REVIEW OF SYSTEMS:  Review of Systems  Constitutional:  Positive for fatigue (depleted). Negative for appetite change (80%).  Respiratory:  Positive for cough.   Gastrointestinal:  Positive for abdominal pain. Negative for diarrhea, nausea and vomiting.  Genitourinary:  Positive for difficulty  urinating (decreased flow).   Neurological:  Positive for extremity weakness (legs).  All other systems reviewed and are negative.  PAST MEDICAL/SURGICAL HISTORY:  Past Medical History:  Diagnosis Date   Chronic pain    neck, back, knees   Class 1 obesity due to excess calories with body mass index (BMI) of 31.0 to 31.9 in adult    Claudication [facility])    DDD (degenerative disc disease), cervical    Diabetes mellitus ([place])    Dyslipidemia    Hypertension    Port-A-Cath in place 07/20/2021   Tobacco dependence    Past Surgical History:  Procedure Laterality Date   BILIARY BRUSHING N/A 02/25/2021   Procedure: BILIARY BRUSHING;  Surgeon: Rogene Houston, MD;  Location: [facility];  Service: Gastroenterology;  Laterality: N/A;   BILIARY STENT PLACEMENT N/A 02/25/2021   Procedure: BILIARY STENT PLACEMENT;  Surgeon: Rogene Houston, MD;  Location: [facility];  Service: Gastroenterology;  Laterality: N/A;   BILIARY STENT PLACEMENT  02/27/2021   Procedure: BILIARY STENT PLACEMENT (10FR x 9cm) IN THE RIGHT SYSTEM;  Surgeon: Rogene Houston, MD;  Location: [facility];  Service: Gastroenterology;;   BREAST SURGERY Left    benign lump- in his 67s.   ERCP N/A 02/25/2021   Procedure: ENDOSCOPIC RETROGRADE CHOLANGIOPANCREATOGRAPHY (ERCP);  Surgeon: Rogene Houston, MD;  Location: [facility];  Service: Gastroenterology;  Laterality: N/A;   ERCP N/A 02/27/2021   Procedure: ENDOSCOPIC RETROGRADE CHOLANGIOPANCREATOGRAPHY (ERCP);  Surgeon: Rogene Houston, MD;  Location: [facility];  Service: Gastroenterology;  Laterality: N/A;   IR IMAGING GUIDED PORT INSERTION  07/21/2021  KNEE SURGERY Left    x2   SPHINCTEROTOMY N/A 02/25/2021   Procedure: SPHINCTEROTOMY;  Surgeon: Rogene Houston, MD;  Location: [facility];  Service: Gastroenterology;  Laterality: N/A;   STENT REMOVAL  02/27/2021   Procedure: STENT REMOVAL (8.5Fr x 9cm);  Surgeon: Rogene Houston, MD;  Location: [facility];  Service: Gastroenterology;;    SOCIAL  HISTORY:  Social History   Socioeconomic History   Marital status: Married    Spouse name: Not on file   Number of children: Not on file   Years of education: Not on file   Highest education level: Not on file  Occupational History   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  Tobacco Use   Smoking status: Every Day    Packs/day: 0.50    Years: 56.00    Pack years: 28.00    Types: Cigarettes   Smokeless tobacco: Never  Vaping Use   Vaping Use: Never used  Substance and Sexual Activity   Alcohol use: Not Currently    Comment: stopped 2.5 years ago 03/11/21   Drug use: Never   Sexual activity: Not on file  Other Topics Concern   Not on file  Social History Narrative   Not on file   Social Determinants of Health   Financial Resource Strain: Medium Risk   Difficulty of Paying Living Expenses: Somewhat hard  Food Insecurity: No Food Insecurity   Worried About Charity fundraiser in the Last Year: Never true   [place] in the Last Year: Never true  Transportation Needs: No Transportation Needs   Lack of Transportation (Medical): No   Lack of Transportation (Non-Medical): No  Physical Activity: Insufficiently Active   Days of Exercise per Week: 2 days   Minutes of Exercise per Session: 20 min  Stress: No Stress Concern Present   Feeling of Stress : Only a little  Social Connections: Moderately Integrated   Frequency of Communication with Friends and Family: More than three times a week   Frequency of Social Gatherings with Friends and Family: More than three times a week   Attends Religious Services: More than 4 times per year   Active Member of [employer] or Organizations: No   Attends Music therapist: Never   Marital Status: Married  Human resources officer Violence: Not At Risk   Fear of Current or Ex-Partner: No   Emotionally Abused: No   Physically Abused: No   Sexually Abused: No    FAMILY HISTORY:  Family History  Problem Relation Age of Onset   CAD  Mother    Cancer Neg Hx     CURRENT MEDICATIONS:  Current Outpatient Medications  Medication Sig Dispense Refill   CISPLATIN IV Inject into the vein once a week. Days 1, 8 every 21 days     Durvalumab (IMFINZI IV) Inject into the vein every 21 ( twenty-one) days.     enoxaparin (LOVENOX) 40 MG/0.4ML injection Inject 40 mg into the skin at bedtime.     ferrous sulfate (FERROUSUL) 325 (65 FE) MG tablet Take 1 tablet (325 mg total) by mouth daily with breakfast.     Gemcitabine HCl (GEMZAR IV) Inject into the vein once a week. Days 1, 8 every 21 days     megestrol (MEGACE) 400 MG/10ML suspension Take 10 mLs (400 mg total) by mouth 2 (two) times daily. 480 mL 0   oxyCODONE (OXY IR/ROXICODONE) 5 MG immediate release tablet Take 5 mg by mouth every 4 (four) hours  as needed for severe pain.     acetaminophen (TYLENOL) 500 MG tablet Take 1,000 mg by mouth every 6 (six) hours as needed for moderate pain or headache. (Patient not taking: Reported on 07/29/2021)     famotidine (PEPCID) 20 MG tablet Take 20 mg by mouth daily. (Patient not taking: Reported on 07/29/2021)     lidocaine-prilocaine (EMLA) cream Apply a small amount to port a cath site and cover with plastic wrap 1 hour prior to chemotherapy appointments (Patient not taking: Reported on 07/29/2021) 30 g 3   metFORMIN (GLUCOPHAGE) 500 MG tablet Take 1 tablet (500 mg total) by mouth 2 (two) times daily with a meal. 60 tablet 0   methylphenidate (RITALIN) 5 MG tablet Take 1 tablet (5 mg total) by mouth daily as needed. 30 tablet 0   prochlorperazine (COMPAZINE) 10 MG tablet Take 1 tablet (10 mg total) by mouth every 6 (six) hours as needed (Nausea or vomiting). (Patient not taking: Reported on 07/29/2021) 60 tablet 3   No current facility-administered medications for this visit.    ALLERGIES:  No Known Allergies  PHYSICAL EXAM:  Performance status (ECOG): 0 - Asymptomatic  Vitals:   07/29/21 0959  BP: 121/87  Pulse: (!) 115  Resp: 19   Temp: (!) 96.7 F (35.9 C)  SpO2: 100%   Wt Readings from Last 3 Encounters:  07/29/21 157 lb 6.4 oz (71.4 kg)  07/22/21 158 lb 11.7 oz (72 kg)  07/21/21 162 lb (73.5 kg)   Physical Exam Vitals reviewed.  Constitutional:      Appearance: Normal appearance.  Cardiovascular:     Rate and Rhythm: Normal rate and regular rhythm.     Pulses: Normal pulses.     Heart sounds: Normal heart sounds.  Pulmonary:     Effort: Pulmonary effort is normal.     Breath sounds: Normal breath sounds.  Abdominal:     Palpations: Abdomen is soft. There is no hepatomegaly, splenomegaly or mass.     Tenderness: There is no abdominal tenderness.  Musculoskeletal:     Right lower leg: No edema.     Left lower leg: No edema.  Neurological:     General: No focal deficit present.     Mental Status: He is alert and oriented to person, place, and time.  Psychiatric:        Mood and Affect: Mood normal.        Behavior: Behavior normal.    LABORATORY DATA:  I have reviewed the labs as listed.  CBC Latest Ref Rng & Units 07/29/2021 07/22/2021 07/17/2021  WBC 4.0 - 10.5 K/uL 5.8 8.3 12.2(H)  Hemoglobin 13.0 - 17.0 g/dL 12.1(L) 12.3(L) 12.8(L)  Hematocrit 39.0 - 52.0 % 36.6(L) 36.6(L) 39.0  Platelets 150 - 400 K/uL 143(L) 277 390   CMP Latest Ref Rng & Units 07/29/2021 07/22/2021 07/17/2021  Glucose 70 - 99 mg/dL 141(H) 152(H) 139(H)  BUN 8 - 23 mg/dL _0 Creatinine 0.61 - 1.24 mg/dL 0.90 1.05 0.99  Sodium 135 - 145 mmol/L 131(L) 132(L) 134(L)  Potassium 3.5 - 5.1 mmol/L 4.4 4.3 4.9  Chloride 98 - 111 mmol/L 97(L) 98 102  CO2 22 - 32 mmol/L _1 Calcium 8.9 - 10.3 mg/dL 9.3 9.6 9.9  Total Protein 6.5 - 8.1 g/dL 7.3 7.6 8.0  Total Bilirubin 0.3 - 1.2 mg/dL 2.7(H) 3.2(H) 4.3(H)  Alkaline Phos 38 - 126 U/L 630(H) 623(H) 651(H)  AST 15 - 41 U/L 85(H) 81(H)  74(H)  ALT 0 - 44 U/L 147(H) 98(H) 112(H)    DIAGNOSTIC IMAGING:  I have independently reviewed the scans and discussed with the  patient. IR IMAGING GUIDED PORT INSERTION  Result Date: 07/21/2021 INDICATION: Cholangiocarcinoma EXAM: IMPLANTED PORT A CATH PLACEMENT WITH ULTRASOUND AND FLUOROSCOPIC GUIDANCE MEDICATIONS: None ANESTHESIA/SEDATION: Moderate (conscious) sedation was employed during this procedure. A total of Versed 2 mg and Fentanyl 100 mcg was administered intravenously. Moderate Sedation Time: 15 minutes. The patient's level of consciousness and vital signs were monitored continuously by radiology nursing throughout the procedure under my direct supervision. FLUOROSCOPY TIME:  0.4 minutes (1 mGy) COMPLICATIONS: None immediate. PROCEDURE: The procedure, risks, benefits, and alternatives were explained to the patient. Questions regarding the procedure were encouraged and answered. The patient understands and consents to the procedure. A timeout was performed prior to the initiation of the procedure. Patient positioned supine on the angiography table. Right neck and anterior upper chest prepped and draped in the usual sterile fashion. All elements of maximal sterile barrier were utilized including, cap, mask, sterile gown, sterile gloves, large sterile drape, hand scrubbing and 2% Chlorhexidine for skin cleaning. The right internal jugular vein was evaluated with ultrasound and shown to be patent. A permanent ultrasound image was obtained and placed in the patient's medical record. Local anesthesia was provided with 1% lidocaine with epinephrine. Using sterile gel and a sterile probe cover, the right internal jugular vein was entered with a 21 ga needle during real time ultrasound guidance. 0.018 inch guidewire placed and 21 ga needle exchanged for transitional dilator set. Utilizing fluoroscopy, 0.035 inch guidewire advanced through the needle without difficulty. Attention then turned to the right anterior upper chest. Following local lidocaine administration, a port pocket was created. The catheter was connected to the port  and brought from the pocket to the venotomy site through a subcutaneous tunnel. The catheter was cut to size and inserted through the peel-away sheath. The catheter tip was positioned at the cavoatrial junction using fluoroscopic guidance. The port aspirated and flushed well. The port pocket was closed with deep and superficial absorbable suture. The port pocket incision and venotomy sites were also sealed with Dermabond. IMPRESSION: Successful placement of a right internal jugular approach power injectable Port-A-Cath. The catheter is ready for immediate use. Electronically Signed   By: Miachel Roux M.D.   On: 07/21/2021 16:34     ASSESSMENT:  Metastatic cholangiocarcinoma: - 02/2021 initial diagnosis presentation with painless jaundice - ERCP with sphincterotomy, subsequent ERCP with plastic biliary stent placement. - MRI-2 cm mass involving the common hepatic duct with upstream biliary dilatation. - 03/2021 consult at [facility] with GI and surgery-confirm diagnosis of perihilar cholangiocarcinoma - 03/21/2021-biopsy/pathology, ERCP-common hepatic duct cytology and brushings performed.  EUS-1 enlarged lymph node versus tumor implant in the gastrohepatic ligament-negative for malignancy.  Single biliary plastic stent removed and replaced with the plastic stent in the right and left ducts. - 04/17/2021-right portal vein embolization to promote left hepatic hypertrophy. - 07/02/2021-diagnostic laparoscopy with multiple areas concerning for metastatic blocks identified in the abdomen and peritoneal surfaces.  6 separate biopsies obtained. - Pathology consistent with metastatic carcinoma. - 37 pound weight loss in the last 5 months.  2.  Social history/family: - Lives at home with his wife.  He worked as a Development worker, community. - Current active smoker, 1 pack/day since age 19. - Maternal grandmother had cancer.  Maternal uncle had brain tumor.   PLAN:  Metastatic cholangiocarcinoma: - First cycle of  cisplatin,  gemcitabine and durvalumab started on 07/22/2021. - He had nausea for 1 day after chemo but denied any vomiting. - He complained of tiredness and legs feeling weak. - We will start him on Ritalin 5 mg in the mornings as needed for severe fatigue. - He was told to increase his water intake. - We reviewed his labs today.  AST and ALT are elevated at 85 and 140 781 alk phos 630.  Total bilirubin improved to 2.7 from 3.2. - We will continue dose reduced gemcitabine at 500 mg/m2 for elevated liver enzymes. - RTC 2 weeks with labs and treatment.  We will check CA 19-9 at that time.  2.  Weight loss: - Continue Megace 400 mg twice daily.  His appetite has improved. - Weight stable since last week.   Orders placed this encounter:  No orders of the defined types were placed in this encounter.    Derek Jack, MD [place] [phone]   I, Thana Ates, am acting as a scribe for Dr. Derek Jack.  I, Derek Jack MD, have reviewed the above documentation for accuracy and completeness, and I agree with the above.

## 2021-07-29 ENCOUNTER — Other Ambulatory Visit: Payer: Self-pay

## 2021-07-29 ENCOUNTER — Inpatient Hospital Stay (HOSPITAL_BASED_OUTPATIENT_CLINIC_OR_DEPARTMENT_OTHER): Payer: Medicare Other | Admitting: Hematology

## 2021-07-29 ENCOUNTER — Inpatient Hospital Stay (HOSPITAL_COMMUNITY): Payer: Medicare Other

## 2021-07-29 VITALS — BP 136/82 | HR 89 | Temp 98.3°F | Resp 18

## 2021-07-29 VITALS — BP 121/87 | HR 115 | Temp 96.7°F | Resp 19 | Ht 71.0 in | Wt 157.4 lb

## 2021-07-29 DIAGNOSIS — C221 Intrahepatic bile duct carcinoma: Secondary | ICD-10-CM

## 2021-07-29 DIAGNOSIS — Z5112 Encounter for antineoplastic immunotherapy: Secondary | ICD-10-CM | POA: Diagnosis not present

## 2021-07-29 DIAGNOSIS — R978 Other abnormal tumor markers: Secondary | ICD-10-CM

## 2021-07-29 DIAGNOSIS — Z95828 Presence of other vascular implants and grafts: Secondary | ICD-10-CM

## 2021-07-29 LAB — COMPREHENSIVE METABOLIC PANEL
ALT: 147 U/L — ABNORMAL HIGH (ref 0–44)
AST: 85 U/L — ABNORMAL HIGH (ref 15–41)
Albumin: 3.2 g/dL — ABNORMAL LOW (ref 3.5–5.0)
Alkaline Phosphatase: 630 U/L — ABNORMAL HIGH (ref 38–126)
Anion gap: 9 (ref 5–15)
BUN: 18 mg/dL (ref 8–23)
CO2: 25 mmol/L (ref 22–32)
Calcium: 9.3 mg/dL (ref 8.9–10.3)
Chloride: 97 mmol/L — ABNORMAL LOW (ref 98–111)
Creatinine, Ser: 0.9 mg/dL (ref 0.61–1.24)
GFR, Estimated: >60 mL/min (ref >60)
Glucose, Bld: 141 mg/dL — ABNORMAL HIGH (ref 70–99)
Potassium: 4.4 mmol/L (ref 3.5–5.1)
Sodium: 131 mmol/L — ABNORMAL LOW (ref 135–145)
Total Bilirubin: 2.7 mg/dL — ABNORMAL HIGH (ref 0.3–1.2)
Total Protein: 7.3 g/dL (ref 6.5–8.1)

## 2021-07-29 LAB — CBC WITH DIFFERENTIAL/PLATELET
Abs Immature Granulocytes: 0.02 10*3/uL (ref 0.00–0.07)
Basophils Absolute: 0.1 10*3/uL (ref 0.0–0.1)
Basophils Relative: 1 %
Eosinophils Absolute: 0.1 10*3/uL (ref 0.0–0.5)
Eosinophils Relative: 2 %
HCT: 36.6 % — ABNORMAL LOW (ref 39.0–52.0)
Hemoglobin: 12.1 g/dL — ABNORMAL LOW (ref 13.0–17.0)
Immature Granulocytes: 0 %
Lymphocytes Relative: 27 %
Lymphs Abs: 1.6 10*3/uL (ref 0.7–4.0)
MCH: 31.3 pg (ref 26.0–34.0)
MCHC: 33.1 g/dL (ref 30.0–36.0)
MCV: 94.6 fL (ref 80.0–100.0)
Monocytes Absolute: 0.7 10*3/uL (ref 0.1–1.0)
Monocytes Relative: 12 %
Neutro Abs: 3.3 10*3/uL (ref 1.7–7.7)
Neutrophils Relative %: 58 %
Platelets: 143 10*3/uL — ABNORMAL LOW (ref 150–400)
RBC: 3.87 MIL/uL — ABNORMAL LOW (ref 4.22–5.81)
RDW: 13.7 % (ref 11.5–15.5)
WBC: 5.8 10*3/uL (ref 4.0–10.5)
nRBC: 0 % (ref 0.0–0.2)

## 2021-07-29 LAB — MAGNESIUM: Magnesium: 2 mg/dL (ref 1.7–2.4)

## 2021-07-29 MED ORDER — MAGNESIUM SULFATE 2 GM/50ML IV SOLN
2.0000 g | Freq: Once | INTRAVENOUS | Status: AC
  Administered 2021-07-29: 12:00:00 2 g via INTRAVENOUS
  Filled 2021-07-29: qty 50

## 2021-07-29 MED ORDER — POTASSIUM CHLORIDE IN NACL 20-0.9 MEQ/L-% IV SOLN
Freq: Once | INTRAVENOUS | Status: AC
  Filled 2021-07-29: qty 1000

## 2021-07-29 MED ORDER — SODIUM CHLORIDE 0.9 % IV SOLN
25.0000 mg/m2 | Freq: Once | INTRAVENOUS | Status: AC
  Administered 2021-07-29: 13:00:00 48 mg via INTRAVENOUS
  Filled 2021-07-29: qty 48

## 2021-07-29 MED ORDER — SODIUM CHLORIDE 0.9 % IV SOLN
150.0000 mg | Freq: Once | INTRAVENOUS | Status: AC
  Administered 2021-07-29: 11:00:00 150 mg via INTRAVENOUS
  Filled 2021-07-29: qty 150

## 2021-07-29 MED ORDER — METHYLPHENIDATE HCL 5 MG PO TABS: 5.0000 mg | ORAL_TABLET | Freq: Every day | ORAL | 0 refills | Status: DC | PRN

## 2021-07-29 MED ORDER — SODIUM CHLORIDE 0.9% FLUSH
10.0000 mL | INTRAVENOUS | Status: DC | PRN
  Administered 2021-07-29: 16:00:00 10 mL

## 2021-07-29 MED ORDER — HEPARIN SOD (PORK) LOCK FLUSH 100 UNIT/ML IV SOLN
500.0000 [IU] | Freq: Once | INTRAVENOUS | Status: AC | PRN
  Administered 2021-07-29: 16:00:00 500 [IU]

## 2021-07-29 MED ORDER — SODIUM CHLORIDE 0.9 % IV SOLN: INTRAVENOUS | Status: AC

## 2021-07-29 MED ORDER — SODIUM CHLORIDE 0.9 % IV SOLN
10.0000 mg | Freq: Once | INTRAVENOUS | Status: AC
  Administered 2021-07-29: 11:00:00 10 mg via INTRAVENOUS
  Filled 2021-07-29: qty 10

## 2021-07-29 MED ORDER — SODIUM CHLORIDE 0.9 % IV SOLN: Freq: Once | INTRAVENOUS | Status: AC

## 2021-07-29 MED ORDER — SODIUM CHLORIDE 0.9 % IV SOLN
500.0000 mg/m2 | Freq: Once | INTRAVENOUS | Status: AC
  Administered 2021-07-29: 12:00:00 950 mg via INTRAVENOUS
  Filled 2021-07-29: qty 24.98

## 2021-07-29 MED ORDER — PALONOSETRON HCL INJECTION 0.25 MG/5ML
0.2500 mg | Freq: Once | INTRAVENOUS | Status: AC
  Administered 2021-07-29: 11:00:00 0.25 mg via INTRAVENOUS
  Filled 2021-07-29: qty 5

## 2021-07-29 NOTE — Progress Notes (Signed)
Patients port flushed without difficulty.  Good blood return noted with no bruising or swelling noted at site.  Stable during access and blood draw.  Patient to remain accessed for treatment. 

## 2021-07-29 NOTE — Progress Notes (Signed)
Pt presents today for treatment per provider's order. Vital signs and labs WNL for treatment today. Okay for treatment today per Dr.K. Pt voiced no new complaints at this time.  Cisplatin and Gemzar given today per MD orders. Tolerated infusion without adverse affects. Vital signs stable. No complaints at this time. Discharged from clinic via wheelchair in stable condition. Alert and oriented x 3. F/U with [facility] as scheduled.

## 2021-07-29 NOTE — Patient Instructions (Signed)
[place] at [facility] Discharge Instructions   You were seen and examined today by Dr. Delton Coombes. We will proceed with you treatment today. Try supplementing your diet with Premier Protein drinks. We will send prescription for Ritalin to help with energy.  Return as scheduled in 2 weeks.    Thank you for choosing [place] at [facility] to provide your oncology and hematology care.  To afford each patient quality time with our provider, please arrive at least 15 minutes before your scheduled appointment time.   If you have a lab appointment with the [facility] please come in thru the Main Entrance and check in at the main information desk.  You need to re-schedule your appointment should you arrive 10 or more minutes late.  We strive to give you quality time with our providers, and arriving late affects you and other patients whose appointments are after yours.  Also, if you no show three or more times for appointments you may be dismissed from the clinic at the providers discretion.     Again, thank you for choosing [facility].  Our hope is that these requests will decrease the amount of time that you wait before being seen by our physicians.       _____________________________________________________________  Should you have questions after your visit to [facility], please contact our office at [phone] and follow the prompts.  Our office hours are 8:00 a.m. and 4:30 p.m. Monday - Friday.  Please note that voicemails left after 4:00 p.m. may not be returned until the following business day.  We are closed weekends and major holidays.  You do have access to a nurse 24-7, just call the main number to the clinic [phone] and do not press any options, hold on the line and a nurse will answer the phone.    For prescription refill requests, have your pharmacy contact our office and allow 72 hours.    Due  to Covid, you will need to wear a mask upon entering the hospital. If you do not have a mask, a mask will be given to you at the Main Entrance upon arrival. For doctor visits, patients may have 1 support person age 2 or older with them. For treatment visits, patients can not have anyone with them due to social distancing guidelines and our immunocompromised population.

## 2021-07-29 NOTE — Patient Instructions (Signed)
[place]  Discharge Instructions: Thank you for choosing [place] to provide your oncology and hematology care.  If you have a lab appointment with the [facility], please come in thru the Main Entrance and check in at the main information desk.  Wear comfortable clothing and clothing appropriate for easy access to any Portacath or PICC line.   We strive to give you quality time with your provider. You may need to reschedule your appointment if you arrive late (15 or more minutes).  Arriving late affects you and other patients whose appointments are after yours.  Also, if you miss three or more appointments without notifying the office, you may be dismissed from the clinic at the providers discretion.      For prescription refill requests, have your pharmacy contact our office and allow 72 hours for refills to be completed.    Today you received the following chemotherapy and/or immunotherapy agents Cisplatin and Gemzar.   To help prevent nausea and vomiting after your treatment, we encourage you to take your nausea medication as directed.  BELOW ARE SYMPTOMS THAT SHOULD BE REPORTED IMMEDIATELY: *FEVER GREATER THAN 100.4 F (38 C) OR HIGHER *CHILLS OR SWEATING *NAUSEA AND VOMITING THAT IS NOT CONTROLLED WITH YOUR NAUSEA MEDICATION *UNUSUAL SHORTNESS OF BREATH *UNUSUAL BRUISING OR BLEEDING *URINARY PROBLEMS (pain or burning when urinating, or frequent urination) *BOWEL PROBLEMS (unusual diarrhea, constipation, pain near the anus) TENDERNESS IN MOUTH AND THROAT WITH OR WITHOUT PRESENCE OF ULCERS (sore throat, sores in mouth, or a toothache) UNUSUAL RASH, SWELLING OR PAIN  UNUSUAL VAGINAL DISCHARGE OR ITCHING   Items with * indicate a potential emergency and should be followed up as soon as possible or go to the Emergency Department if any problems should occur.  Please show the CHEMOTHERAPY ALERT CARD or IMMUNOTHERAPY ALERT CARD at check-in to the  Emergency Department and triage nurse.  Should you have questions after your visit or need to cancel or reschedule your appointment, please contact [facility] [phone]  and follow the prompts.  Office hours are 8:00 a.m. to 4:30 p.m. Monday - Friday. Please note that voicemails left after 4:00 p.m. may not be returned until the following business day.  We are closed weekends and major holidays. You have access to a nurse at all times for urgent questions. Please call the main number to the clinic [phone] and follow the prompts.  For any non-urgent questions, you may also contact your provider using MyChart. We now offer e-Visits for anyone 27 and older to request care online for non-urgent symptoms. For details visit [email or web address].   Also download the MyChart app! Go to the app store, search "MyChart", open the app, select [place], and log in with your MyChart username and password.  Due to Covid, a mask is required upon entering the hospital/clinic. If you do not have a mask, one will be given to you upon arrival. For doctor visits, patients may have 1 support person aged 11 or older with them. For treatment visits, patients cannot have anyone with them due to current Covid guidelines and our immunocompromised population.

## 2021-07-30 ENCOUNTER — Inpatient Hospital Stay (HOSPITAL_COMMUNITY): Payer: Medicare Other

## 2021-07-30 ENCOUNTER — Encounter (HOSPITAL_COMMUNITY): Payer: Self-pay

## 2021-07-30 VITALS — BP 135/90 | HR 99 | Temp 97.2°F | Resp 20

## 2021-07-30 DIAGNOSIS — Z95828 Presence of other vascular implants and grafts: Secondary | ICD-10-CM

## 2021-07-30 DIAGNOSIS — C221 Intrahepatic bile duct carcinoma: Secondary | ICD-10-CM

## 2021-07-30 DIAGNOSIS — Z5112 Encounter for antineoplastic immunotherapy: Secondary | ICD-10-CM | POA: Diagnosis not present

## 2021-07-30 MED ORDER — PEGFILGRASTIM-CBQV 6 MG/0.6ML ~~LOC~~ SOSY
6.0000 mg | PREFILLED_SYRINGE | Freq: Once | SUBCUTANEOUS | Status: AC
  Administered 2021-07-30: 13:00:00 6 mg via SUBCUTANEOUS
  Filled 2021-07-30: qty 0.6

## 2021-07-30 NOTE — Patient Instructions (Signed)
[place]  Discharge Instructions: Thank you for choosing [facility] to provide your oncology and hematology care.  If you have a lab appointment with the [facility], please come in thru the Main Entrance and check in at the main information desk.  Wear comfortable clothing and clothing appropriate for easy access to any Portacath or PICC line.   We strive to give you quality time with your provider. You may need to reschedule your appointment if you arrive late (15 or more minutes).  Arriving late affects you and other patients whose appointments are after yours.  Also, if you miss three or more appointments without notifying the office, you may be dismissed from the clinic at the providers discretion.      For prescription refill requests, have your pharmacy contact our office and allow 72 hours for refills to be completed.       To help prevent nausea and vomiting after your treatment, we encourage you to take your nausea medication as directed.  BELOW ARE SYMPTOMS THAT SHOULD BE REPORTED IMMEDIATELY: *FEVER GREATER THAN 100.4 F (38 C) OR HIGHER *CHILLS OR SWEATING *NAUSEA AND VOMITING THAT IS NOT CONTROLLED WITH YOUR NAUSEA MEDICATION *UNUSUAL SHORTNESS OF BREATH *UNUSUAL BRUISING OR BLEEDING *URINARY PROBLEMS (pain or burning when urinating, or frequent urination) *BOWEL PROBLEMS (unusual diarrhea, constipation, pain near the anus) TENDERNESS IN MOUTH AND THROAT WITH OR WITHOUT PRESENCE OF ULCERS (sore throat, sores in mouth, or a toothache) UNUSUAL RASH, SWELLING OR PAIN  UNUSUAL VAGINAL DISCHARGE OR ITCHING   Items with * indicate a potential emergency and should be followed up as soon as possible or go to the Emergency Department if any problems should occur.  Please show the CHEMOTHERAPY ALERT CARD or IMMUNOTHERAPY ALERT CARD at check-in to the Emergency Department and triage nurse.  Should you have questions after your visit or need to cancel  or reschedule your appointment, please contact [facility] [phone]  and follow the prompts.  Office hours are 8:00 a.m. to 4:30 p.m. Monday - Friday. Please note that voicemails left after 4:00 p.m. may not be returned until the following business day.  We are closed weekends and major holidays. You have access to a nurse at all times for urgent questions. Please call the main number to the clinic [phone] and follow the prompts.  For any non-urgent questions, you may also contact your provider using MyChart. We now offer e-Visits for anyone 40 and older to request care online for non-urgent symptoms. For details visit [email or web address].   Also download the MyChart app! Go to the app store, search "MyChart", open the app, select [facility], and log in with your MyChart username and password.  Due to Covid, a mask is required upon entering the hospital/clinic. If you do not have a mask, one will be given to you upon arrival. For doctor visits, patients may have 1 support person aged 69 or older with them. For treatment visits, patients cannot have anyone with them due to current Covid guidelines and our immunocompromised population.   Pegfilgrastim Injection What is this medication? PEGFILGRASTIM (PEG fil gra stim) lowers the risk of infection in people who are receiving chemotherapy. It works by Building control surveyor make more white blood cells, which protects your body from infection. It may also be used to help people who have been exposed to high doses of radiation. This medicine may be used for other purposes; ask your health care provider or  pharmacist if you have questions. COMMON BRAND NAME(S): Rexene Edison, Ziextenzo What should I tell my care team before I take this medication? They need to know if you have any of these conditions: Kidney disease Latex allergy Ongoing radiation therapy Sickle cell disease Skin reactions to acrylic adhesives  (On-Body Injector only) An unusual or allergic reaction to pegfilgrastim, filgrastim, other medications, foods, dyes, or preservatives Pregnant or trying to get pregnant Breast-feeding How should I use this medication? This medication is for injection under the skin. If you get this medication at home, you will be taught how to prepare and give the pre-filled syringe or how to use the On-body Injector. Refer to the patient Instructions for Use for detailed instructions. Use exactly as directed. Tell your care team immediately if you suspect that the On-body Injector may not have performed as intended or if you suspect the use of the On-body Injector resulted in a missed or partial dose. It is important that you put your used needles and syringes in a special sharps container. Do not put them in a trash can. If you do not have a sharps container, call your pharmacist or care team to get one. Talk to your care team about the use of this medication in children. While this medication may be prescribed for selected conditions, precautions do apply. Overdosage: If you think you have taken too much of this medicine contact a poison control center or emergency room at once. NOTE: This medicine is only for you. Do not share this medicine with others. What if I miss a dose? It is important not to miss your dose. Call your care team if you miss your dose. If you miss a dose due to an On-body Injector failure or leakage, a new dose should be administered as soon as possible using a single prefilled syringe for manual use. What may interact with this medication? Interactions have not been studied. This list may not describe all possible interactions. Give your health care provider a list of all the medicines, herbs, non-prescription drugs, or dietary supplements you use. Also tell them if you smoke, drink alcohol, or use illegal drugs. Some items may interact with your medicine. What should I watch for while using  this medication? Your condition will be monitored carefully while you are receiving this medication. You may need blood work done while you are taking this medication. Talk to your care team about your risk of cancer. You may be more at risk for certain types of cancer if you take this medication. If you are going to need a MRI, CT scan, or other procedure, tell your care team that you are using this medication (On-Body Injector only). What side effects may I notice from receiving this medication? Side effects that you should report to your care team as soon as possible: Allergic reactions--skin rash, itching, hives, swelling of the face, lips, tongue, or throat Capillary leak syndrome--stomach or muscle pain, unusual weakness or fatigue, feeling faint or lightheaded, decrease in the amount of urine, swelling of the ankles, hands, or feet, trouble breathing High white blood cell level--fever, fatigue, trouble breathing, night sweats, change in vision, weight loss Inflammation of the aorta--fever, fatigue, back, chest, or stomach pain, severe headache Kidney injury (glomerulonephritis)--decrease in the amount of urine, red or dark brown urine, foamy or bubbly urine, swelling of the ankles, hands, or feet Shortness of breath or trouble breathing Spleen injury--pain in upper left stomach or shoulder Unusual bruising or bleeding Side  effects that usually do not require medical attention (report to your care team if they continue or are bothersome): Bone pain Pain in the hands or feet This list may not describe all possible side effects. Call your doctor for medical advice about side effects. You may report side effects to FDA at 1-800-FDA-1088. Where should I keep my medication? Keep out of the reach of children. If you are using this medication at home, you will be instructed on how to store it. Throw away any unused medication after the expiration date on the label. NOTE: This sheet is a summary.  It may not cover all possible information. If you have questions about this medicine, talk to your doctor, pharmacist, or health care provider.  2022 Elsevier/Gold Standard (2021-04-15 00:00:00)

## 2021-07-30 NOTE — Progress Notes (Signed)
Patient tolerated injection with no complaints voiced.  Site clean and dry with no bruising or swelling noted at site.  See MAR for details.  Band aid applied.  Patient stable during and after injection.  Vss with discharge and left in satisfactory condition with no s/s of distress noted.  

## 2021-08-06 ENCOUNTER — Ambulatory Visit (HOSPITAL_COMMUNITY): Payer: Medicare Other

## 2021-08-06 ENCOUNTER — Other Ambulatory Visit (HOSPITAL_COMMUNITY): Payer: Medicare Other

## 2021-08-11 NOTE — Progress Notes (Signed)
[name], [place] [address]   CLINIC:  Medical Oncology/Hematology  PCP:  [name], PA-C [place] / [place] [address] [phone]   REASON FOR VISIT:  Follow-up for intrahepatic cholangiocarcinoma  PRIOR THERAPY: none  NGS Results: not done  CURRENT THERAPY: Cisplatin + Gemcitabine D1,8 q21d  BRIEF ONCOLOGIC HISTORY:  Oncology History  Cholangiocarcinoma ([place])  02/28/2021 Initial Diagnosis   Cholangiocarcinoma ([place])   07/17/2021 Cancer Staging   Staging form: Perihilar Bile Ducts, AJCC 8th Edition - Clinical stage from 07/17/2021: Stage IVB (cTX, cNX, pM1) - Signed by Derek Jack, MD on 07/17/2021 Stage prefix: Initial diagnosis    07/22/2021 -  Chemotherapy   Patient is on Treatment Plan : BILIARY TRACT Cisplatin + Gemcitabine D1,8 q21d       CANCER STAGING:  Cancer Staging  Cholangiocarcinoma ([place]) Staging form: Perihilar Bile Ducts, AJCC 8th Edition - Clinical stage from 07/17/2021: Stage IVB (cTX, cNX, pM1) - Signed by Derek Jack, MD on 07/17/2021   INTERVAL HISTORY:  [name], a 66 y.o. male, returns for routine follow-up and consideration for next cycle of chemotherapy. Jonathan Keith was last seen on 07/29/2021.  Due for cycle #2 of Cisplatin + Gemcitabine today.   Overall, he tells me he has been feeling pretty well. He reports fatigue. He takes 1 Compazine tablet every morning. He denies n/v/d. He has gained 3 lbs. He reports increased frequency of urination at night. He denies skin rash, tinnitus, and tingling/numbness. He started drinking 1 protein shake mixed with ice cream starting yesterday. He is not currently taking Megace. His energy has improved with Ritalin. He reports occasional abdominal pain at his surgical site.  Overall, he feels ready for next cycle of chemo today.   REVIEW OF SYSTEMS:  Review of Systems  Constitutional:  Negative for appetite change  (70%), fatigue (60%) and unexpected weight change (+3 lbs).  HENT:   Negative for tinnitus.   Gastrointestinal:  Positive for abdominal pain (@ surgical site). Negative for diarrhea, nausea and vomiting.  Genitourinary:  Positive for frequency and nocturia.   Skin:  Negative for rash.  Neurological:  Negative for numbness.  All other systems reviewed and are negative.  PAST MEDICAL/SURGICAL HISTORY:  Past Medical History:  Diagnosis Date   Chronic pain    neck, back, knees   Class 1 obesity due to excess calories with body mass index (BMI) of 31.0 to 31.9 in adult    Claudication [facility])    DDD (degenerative disc disease), cervical    Diabetes mellitus ([place])    Dyslipidemia    Hypertension    Port-A-Cath in place 07/20/2021   Tobacco dependence    Past Surgical History:  Procedure Laterality Date   BILIARY BRUSHING N/A 02/25/2021   Procedure: BILIARY BRUSHING;  Surgeon: Rogene [place], MD;  Location: [facility];  Service: Gastroenterology;  Laterality: N/A;   BILIARY STENT PLACEMENT N/A 02/25/2021   Procedure: BILIARY STENT PLACEMENT;  Surgeon: Rogene [place], MD;  Location: [facility];  Service: Gastroenterology;  Laterality: N/A;   BILIARY STENT PLACEMENT  02/27/2021   Procedure: BILIARY STENT PLACEMENT (10FR x 9cm) IN THE RIGHT SYSTEM;  Surgeon: Rogene [place], MD;  Location: [facility];  Service: Gastroenterology;;   BREAST SURGERY Left    benign lump- in his 44s.   ERCP N/A 02/25/2021   Procedure: ENDOSCOPIC RETROGRADE CHOLANGIOPANCREATOGRAPHY (ERCP);  Surgeon: Rogene [place], MD;  Location: [facility];  Service: Gastroenterology;  Laterality: N/A;   ERCP N/A 02/27/2021   Procedure: ENDOSCOPIC RETROGRADE CHOLANGIOPANCREATOGRAPHY (ERCP);  Surgeon: Rogene [place], MD;  Location: [facility];  Service: Gastroenterology;  Laterality: N/A;   IR IMAGING GUIDED PORT INSERTION  07/21/2021   KNEE SURGERY Left    x2   SPHINCTEROTOMY N/A 02/25/2021   Procedure: SPHINCTEROTOMY;  Surgeon: Rogene [place], MD;  Location: [facility];  Service: Gastroenterology;  Laterality: N/A;   STENT REMOVAL  02/27/2021   Procedure: STENT REMOVAL (8.5Fr x 9cm);  Surgeon: Rogene [place], MD;  Location: [facility];  Service: Gastroenterology;;    SOCIAL HISTORY:  Social History   Socioeconomic History   Marital status: Married    Spouse name: Not on file   Number of children: Not on file   Years of education: Not on file   Highest education level: Not on file  Occupational History   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  Tobacco Use   Smoking status: Every Day    Packs/day: 0.50    Years: 56.00    Pack years: 28.00    Types: Cigarettes   Smokeless tobacco: Never  Vaping Use   Vaping Use: Never used  Substance and Sexual Activity   Alcohol use: Not Currently    Comment: stopped 2.5 years ago 03/11/21   Drug use: Never   Sexual activity: Not on file  Other Topics Concern   Not on file  Social History Narrative   Not on file   Social Determinants of Health   Financial Resource Strain: Medium Risk   Difficulty of Paying Living Expenses: Somewhat hard  Food Insecurity: No Food Insecurity   Worried About Charity fundraiser in the Last Year: Never true   [place] in the Last Year: Never true  Transportation Needs: No Transportation Needs   Lack of Transportation (Medical): No   Lack of Transportation (Non-Medical): No  Physical Activity: Insufficiently Active   Days of Exercise per Week: 2 days   Minutes of Exercise per Session: 20 min  Stress: No Stress Concern Present   Feeling of Stress : Only a little  Social Connections: Moderately Integrated   Frequency of Communication with Friends and Family: More than three times a week   Frequency of Social Gatherings with Friends and Family: More than three times a week   Attends Religious Services: More than 4 times per year   Active Member of [employer] or Organizations: No   Attends Music therapist: Never   Marital Status:  Married  Human resources officer Violence: Not At Risk   Fear of Current or Ex-Partner: No   Emotionally Abused: No   Physically Abused: No   Sexually Abused: No    FAMILY HISTORY:  Family History  Problem Relation Age of Onset   CAD Mother    Cancer Neg Hx     CURRENT MEDICATIONS:  Current Outpatient Medications  Medication Sig Dispense Refill   CISPLATIN IV Inject into the vein once a week. Days 1, 8 every 21 days     Durvalumab (IMFINZI IV) Inject into the vein every 21 ( twenty-one) days.     famotidine (PEPCID) 20 MG tablet Take 20 mg by mouth daily.     ferrous sulfate (FERROUSUL) 325 (65 FE) MG tablet Take 1 tablet (325 mg total) by mouth daily with breakfast.     Gemcitabine HCl (GEMZAR IV) Inject into the vein once a week. Days 1, 8 every 21 days  lidocaine-prilocaine (EMLA) cream Apply a small amount to port a cath site and cover with plastic wrap 1 hour prior to chemotherapy appointments 30 g 3   megestrol (MEGACE) 400 MG/10ML suspension Take 10 mLs (400 mg total) by mouth 2 (two) times daily. 480 mL 0   methylphenidate (RITALIN) 5 MG tablet Take 1 tablet (5 mg total) by mouth daily as needed. 30 tablet 0   acetaminophen (TYLENOL) 500 MG tablet Take 1,000 mg by mouth every 6 (six) hours as needed for moderate pain or headache. (Patient not taking: Reported on 08/12/2021)     metFORMIN (GLUCOPHAGE) 500 MG tablet Take 1 tablet (500 mg total) by mouth 2 (two) times daily with a meal. 60 tablet 0   oxyCODONE (OXY IR/ROXICODONE) 5 MG immediate release tablet Take 5 mg by mouth every 4 (four) hours as needed for severe pain. (Patient not taking: Reported on 08/12/2021)     prochlorperazine (COMPAZINE) 10 MG tablet Take 1 tablet (10 mg total) by mouth every 6 (six) hours as needed (Nausea or vomiting). (Patient not taking: Reported on 08/12/2021) 60 tablet 3   No current facility-administered medications for this visit.    ALLERGIES:  No Known Allergies  PHYSICAL EXAM:  Performance  status (ECOG): 0 - Asymptomatic  Vitals:   08/12/21 0805  BP: (!) 151/79  Pulse: (!) 106  Resp: 18  Temp: 98.1 F (36.7 C)  SpO2: 100%   Wt Readings from Last 3 Encounters:  08/12/21 160 lb 12.8 oz (72.9 kg)  07/29/21 157 lb 6.4 oz (71.4 kg)  07/22/21 158 lb 11.7 oz (72 kg)   Physical Exam Vitals reviewed.  Constitutional:      Appearance: Normal appearance.  Cardiovascular:     Rate and Rhythm: Normal rate and regular rhythm.     Pulses: Normal pulses.     Heart sounds: Normal heart sounds.  Pulmonary:     Effort: Pulmonary effort is normal.     Breath sounds: Normal breath sounds.  Musculoskeletal:     Right lower leg: No edema.     Left lower leg: No edema.  Neurological:     General: No focal deficit present.     Mental Status: He is alert and oriented to person, place, and time.  Psychiatric:        Mood and Affect: Mood normal.        Behavior: Behavior normal.    LABORATORY DATA:  I have reviewed the labs as listed.  CBC Latest Ref Rng & Units 08/12/2021 07/29/2021 07/22/2021  WBC 4.0 - 10.5 K/uL 16.0(H) 5.8 8.3  Hemoglobin 13.0 - 17.0 g/dL 10.8(L) 12.1(L) 12.3(L)  Hematocrit 39.0 - 52.0 % 32.3(L) 36.6(L) 36.6(L)  Platelets 150 - 400 K/uL 330 143(L) 277   CMP Latest Ref Rng & Units 08/12/2021 07/29/2021 07/22/2021  Glucose 70 - 99 mg/dL 201(H) 141(H) 152(H)  BUN 8 - 23 mg/dL 10 18 21   Creatinine 0.61 - 1.24 mg/dL 0.78 0.90 1.05  Sodium 135 - 145 mmol/L 135 131(L) 132(L)  Potassium 3.5 - 5.1 mmol/L 4.0 4.4 4.3  Chloride 98 - 111 mmol/L 102 97(L) 98  CO2 22 - 32 mmol/L 25 25 24   Calcium 8.9 - 10.3 mg/dL 8.7(L) 9.3 9.6  Total Protein 6.5 - 8.1 g/dL 6.5 7.3 7.6  Total Bilirubin 0.3 - 1.2 mg/dL 1.3(H) 2.7(H) 3.2(H)  Alkaline Phos 38 - 126 U/L 613(H) 630(H) 623(H)  AST 15 - 41 U/L 51(H) 85(H) 81(H)  ALT 0 - 44 U/L  64(H) 147(H) 98(H)    DIAGNOSTIC IMAGING:  I have independently reviewed the scans and discussed with the patient. IR IMAGING GUIDED PORT  INSERTION  Result Date: 07/21/2021 INDICATION: Cholangiocarcinoma EXAM: IMPLANTED PORT A CATH PLACEMENT WITH ULTRASOUND AND FLUOROSCOPIC GUIDANCE MEDICATIONS: None ANESTHESIA/SEDATION: Moderate (conscious) sedation was employed during this procedure. A total of Versed 2 mg and Fentanyl 100 mcg was administered intravenously. Moderate Sedation Time: 15 minutes. The patient's level of consciousness and vital signs were monitored continuously by radiology nursing throughout the procedure under my direct supervision. FLUOROSCOPY TIME:  0.4 minutes (1 mGy) COMPLICATIONS: None immediate. PROCEDURE: The procedure, risks, benefits, and alternatives were explained to the patient. Questions regarding the procedure were encouraged and answered. The patient understands and consents to the procedure. A timeout was performed prior to the initiation of the procedure. Patient positioned supine on the angiography table. Right neck and anterior upper chest prepped and draped in the usual sterile fashion. All elements of maximal sterile barrier were utilized including, cap, mask, sterile gown, sterile gloves, large sterile drape, hand scrubbing and 2% Chlorhexidine for skin cleaning. The right internal jugular vein was evaluated with ultrasound and shown to be patent. A permanent ultrasound image was obtained and placed in the patient's medical record. Local anesthesia was provided with 1% lidocaine with epinephrine. Using sterile gel and a sterile probe cover, the right internal jugular vein was entered with a 21 ga needle during real time ultrasound guidance. 0.018 inch guidewire placed and 21 ga needle exchanged for transitional dilator set. Utilizing fluoroscopy, 0.035 inch guidewire advanced through the needle without difficulty. Attention then turned to the right anterior upper chest. Following local lidocaine administration, a port pocket was created. The catheter was connected to the port and brought from the pocket to the  venotomy site through a subcutaneous tunnel. The catheter was cut to size and inserted through the peel-away sheath. The catheter tip was positioned at the cavoatrial junction using fluoroscopic guidance. The port aspirated and flushed well. The port pocket was closed with deep and superficial absorbable suture. The port pocket incision and venotomy sites were also sealed with Dermabond. IMPRESSION: Successful placement of a right internal jugular approach power injectable Port-A-Cath. The catheter is ready for immediate use. Electronically Signed   By: Miachel Roux M.D.   On: 07/21/2021 16:34     ASSESSMENT:  Metastatic cholangiocarcinoma: - 02/2021 initial diagnosis presentation with painless jaundice - ERCP with sphincterotomy, subsequent ERCP with plastic biliary stent placement. - MRI-2 cm mass involving the common hepatic duct with upstream biliary dilatation. - 03/2021 consult at [facility] with GI and surgery-confirm diagnosis of perihilar cholangiocarcinoma - 03/21/2021-biopsy/pathology, ERCP-common hepatic duct cytology and brushings performed.  EUS-1 enlarged lymph node versus tumor implant in the gastrohepatic ligament-negative for malignancy.  Single biliary plastic stent removed and replaced with the plastic stent in the right and left ducts. - 04/17/2021-right portal vein embolization to promote left hepatic hypertrophy. - 07/02/2021-diagnostic laparoscopy with multiple areas concerning for metastatic blocks identified in the abdomen and peritoneal surfaces.  6 separate biopsies obtained. - Pathology consistent with metastatic carcinoma. - 37 pound weight loss in the last 5 months. - Cycle 1 of gemcitabine, cisplatin, durvalumab on 07/22/2021.  2.  Social history/family: - Lives at home with his wife.  He worked as a Development worker, community. - Current active smoker, 1 pack/day since age 41. - Maternal grandmother had cancer.  Maternal uncle had brain tumor.   PLAN:  Metastatic  cholangiocarcinoma: - He has  tolerated first cycle reasonably well. - We have reviewed his labs.  His AST and ALT have improved.  Bilirubin also improved to 1.3 today.  CBC shows elevated white count consistent with growth factor.  Anemia 10.8 with normal MCV consistent with myelosuppression. - He will proceed with cycle 2 today.  We will increase gemcitabine dose to 700 mg per metered square. - RTC 3 weeks for follow-up.  2.  Weight loss: - He did not start taking Megace.  However he is eating better. - He gained about 3 pounds.  3.  Cancer fatigue: - Continue Ritalin 5 mg in the morning which is helping with energy.  4.  Urinary problems: - He reports incomplete evacuation.  We will start him on Flomax 0.4 mg at bedtime.   Orders placed this encounter:  No orders of the defined types were placed in this encounter.    Derek Jack, MD [place] [phone]   I, Thana Ates, am acting as a scribe for Dr. Derek Jack.  I, Derek Jack MD, have reviewed the above documentation for accuracy and completeness, and I agree with the above.

## 2021-08-12 ENCOUNTER — Inpatient Hospital Stay (HOSPITAL_COMMUNITY): Payer: Medicare Other | Attending: Hematology

## 2021-08-12 ENCOUNTER — Inpatient Hospital Stay (HOSPITAL_BASED_OUTPATIENT_CLINIC_OR_DEPARTMENT_OTHER): Payer: Medicare Other | Admitting: Hematology

## 2021-08-12 ENCOUNTER — Inpatient Hospital Stay (HOSPITAL_COMMUNITY): Payer: Medicare Other

## 2021-08-12 ENCOUNTER — Other Ambulatory Visit: Payer: Self-pay

## 2021-08-12 VITALS — BP 133/75 | HR 88 | Temp 97.5°F | Resp 18

## 2021-08-12 VITALS — BP 151/79 | HR 106 | Temp 98.1°F | Resp 18 | Ht 71.0 in | Wt 160.8 lb

## 2021-08-12 DIAGNOSIS — R5383 Other fatigue: Secondary | ICD-10-CM | POA: Diagnosis not present

## 2021-08-12 DIAGNOSIS — C221 Intrahepatic bile duct carcinoma: Secondary | ICD-10-CM

## 2021-08-12 DIAGNOSIS — R350 Frequency of micturition: Secondary | ICD-10-CM | POA: Diagnosis not present

## 2021-08-12 DIAGNOSIS — C778 Secondary and unspecified malignant neoplasm of lymph nodes of multiple regions: Secondary | ICD-10-CM | POA: Diagnosis not present

## 2021-08-12 DIAGNOSIS — E785 Hyperlipidemia, unspecified: Secondary | ICD-10-CM | POA: Diagnosis not present

## 2021-08-12 DIAGNOSIS — R634 Abnormal weight loss: Secondary | ICD-10-CM | POA: Diagnosis not present

## 2021-08-12 DIAGNOSIS — R530 Neoplastic (malignant) related fatigue: Secondary | ICD-10-CM | POA: Diagnosis not present

## 2021-08-12 DIAGNOSIS — Z5189 Encounter for other specified aftercare: Secondary | ICD-10-CM | POA: Insufficient documentation

## 2021-08-12 DIAGNOSIS — F17210 Nicotine dependence, cigarettes, uncomplicated: Secondary | ICD-10-CM | POA: Diagnosis not present

## 2021-08-12 DIAGNOSIS — I10 Essential (primary) hypertension: Secondary | ICD-10-CM | POA: Insufficient documentation

## 2021-08-12 DIAGNOSIS — Z5112 Encounter for antineoplastic immunotherapy: Secondary | ICD-10-CM | POA: Insufficient documentation

## 2021-08-12 DIAGNOSIS — E119 Type 2 diabetes mellitus without complications: Secondary | ICD-10-CM | POA: Insufficient documentation

## 2021-08-12 DIAGNOSIS — R42 Dizziness and giddiness: Secondary | ICD-10-CM | POA: Insufficient documentation

## 2021-08-12 DIAGNOSIS — R109 Unspecified abdominal pain: Secondary | ICD-10-CM | POA: Diagnosis not present

## 2021-08-12 DIAGNOSIS — Z5111 Encounter for antineoplastic chemotherapy: Secondary | ICD-10-CM | POA: Diagnosis present

## 2021-08-12 DIAGNOSIS — Z79899 Other long term (current) drug therapy: Secondary | ICD-10-CM | POA: Insufficient documentation

## 2021-08-12 DIAGNOSIS — Z8249 Family history of ischemic heart disease and other diseases of the circulatory system: Secondary | ICD-10-CM | POA: Diagnosis not present

## 2021-08-12 DIAGNOSIS — R351 Nocturia: Secondary | ICD-10-CM | POA: Insufficient documentation

## 2021-08-12 DIAGNOSIS — R978 Other abnormal tumor markers: Secondary | ICD-10-CM

## 2021-08-12 DIAGNOSIS — G479 Sleep disorder, unspecified: Secondary | ICD-10-CM | POA: Insufficient documentation

## 2021-08-12 DIAGNOSIS — Z95828 Presence of other vascular implants and grafts: Secondary | ICD-10-CM

## 2021-08-12 LAB — COMPREHENSIVE METABOLIC PANEL
ALT: 64 U/L — ABNORMAL HIGH (ref 0–44)
AST: 51 U/L — ABNORMAL HIGH (ref 15–41)
Albumin: 2.9 g/dL — ABNORMAL LOW (ref 3.5–5.0)
Alkaline Phosphatase: 613 U/L — ABNORMAL HIGH (ref 38–126)
Anion gap: 8 (ref 5–15)
BUN: 10 mg/dL (ref 8–23)
CO2: 25 mmol/L (ref 22–32)
Calcium: 8.7 mg/dL — ABNORMAL LOW (ref 8.9–10.3)
Chloride: 102 mmol/L (ref 98–111)
Creatinine, Ser: 0.78 mg/dL (ref 0.61–1.24)
GFR, Estimated: >60 mL/min (ref >60)
Glucose, Bld: 201 mg/dL — ABNORMAL HIGH (ref 70–99)
Potassium: 4 mmol/L (ref 3.5–5.1)
Sodium: 135 mmol/L (ref 135–145)
Total Bilirubin: 1.3 mg/dL — ABNORMAL HIGH (ref 0.3–1.2)
Total Protein: 6.5 g/dL (ref 6.5–8.1)

## 2021-08-12 LAB — CBC WITH DIFFERENTIAL/PLATELET
Abs Immature Granulocytes: 0.32 10*3/uL — ABNORMAL HIGH (ref 0.00–0.07)
Basophils Absolute: 0.1 10*3/uL (ref 0.0–0.1)
Basophils Relative: 1 %
Eosinophils Absolute: 0.2 10*3/uL (ref 0.0–0.5)
Eosinophils Relative: 1 %
HCT: 32.3 % — ABNORMAL LOW (ref 39.0–52.0)
Hemoglobin: 10.8 g/dL — ABNORMAL LOW (ref 13.0–17.0)
Immature Granulocytes: 2 %
Lymphocytes Relative: 13 %
Lymphs Abs: 2.1 10*3/uL (ref 0.7–4.0)
MCH: 32.2 pg (ref 26.0–34.0)
MCHC: 33.4 g/dL (ref 30.0–36.0)
MCV: 96.4 fL (ref 80.0–100.0)
Monocytes Absolute: 1.6 10*3/uL — ABNORMAL HIGH (ref 0.1–1.0)
Monocytes Relative: 10 %
Neutro Abs: 11.7 10*3/uL — ABNORMAL HIGH (ref 1.7–7.7)
Neutrophils Relative %: 73 %
Platelets: 330 10*3/uL (ref 150–400)
RBC: 3.35 MIL/uL — ABNORMAL LOW (ref 4.22–5.81)
RDW: 15.6 % — ABNORMAL HIGH (ref 11.5–15.5)
WBC: 16 10*3/uL — ABNORMAL HIGH (ref 4.0–10.5)
nRBC: 0 % (ref 0.0–0.2)

## 2021-08-12 LAB — MAGNESIUM: Magnesium: 1.8 mg/dL (ref 1.7–2.4)

## 2021-08-12 MED ORDER — HEPARIN SOD (PORK) LOCK FLUSH 100 UNIT/ML IV SOLN
500.0000 [IU] | Freq: Once | INTRAVENOUS | Status: AC | PRN
  Administered 2021-08-12: 500 [IU]

## 2021-08-12 MED ORDER — MAGNESIUM SULFATE 2 GM/50ML IV SOLN
2.0000 g | Freq: Once | INTRAVENOUS | Status: AC
  Administered 2021-08-12: 2 g via INTRAVENOUS
  Filled 2021-08-12: qty 50

## 2021-08-12 MED ORDER — SODIUM CHLORIDE 0.9 % IV SOLN
150.0000 mg | Freq: Once | INTRAVENOUS | Status: AC
  Administered 2021-08-12: 150 mg via INTRAVENOUS
  Filled 2021-08-12: qty 150

## 2021-08-12 MED ORDER — SODIUM CHLORIDE 0.9 % IV SOLN
700.0000 mg/m2 | Freq: Once | INTRAVENOUS | Status: AC
  Administered 2021-08-12: 1330 mg via INTRAVENOUS
  Filled 2021-08-12: qty 10.52

## 2021-08-12 MED ORDER — PALONOSETRON HCL INJECTION 0.25 MG/5ML
0.2500 mg | Freq: Once | INTRAVENOUS | Status: AC
  Administered 2021-08-12: 0.25 mg via INTRAVENOUS
  Filled 2021-08-12: qty 5

## 2021-08-12 MED ORDER — TAMSULOSIN HCL 0.4 MG PO CAPS: 0.4000 mg | ORAL_CAPSULE | Freq: Every day | ORAL | 3 refills | Status: DC

## 2021-08-12 MED ORDER — SODIUM CHLORIDE 0.9 % IV SOLN
10.0000 mg | Freq: Once | INTRAVENOUS | Status: AC
  Administered 2021-08-12: 10 mg via INTRAVENOUS
  Filled 2021-08-12: qty 10

## 2021-08-12 MED ORDER — SODIUM CHLORIDE 0.9 % IV SOLN: Freq: Once | INTRAVENOUS | Status: AC

## 2021-08-12 MED ORDER — SODIUM CHLORIDE 0.9% FLUSH
10.0000 mL | INTRAVENOUS | Status: DC | PRN
  Administered 2021-08-12: 10 mL

## 2021-08-12 MED ORDER — POTASSIUM CHLORIDE IN NACL 20-0.9 MEQ/L-% IV SOLN
Freq: Once | INTRAVENOUS | Status: AC
  Filled 2021-08-12: qty 1000

## 2021-08-12 MED ORDER — SODIUM CHLORIDE 0.9 % IV SOLN
1500.0000 mg | Freq: Once | INTRAVENOUS | Status: AC
  Administered 2021-08-12: 1500 mg via INTRAVENOUS
  Filled 2021-08-12: qty 30

## 2021-08-12 MED ORDER — SODIUM CHLORIDE 0.9 % IV SOLN
25.0000 mg/m2 | Freq: Once | INTRAVENOUS | Status: AC
  Administered 2021-08-12: 48 mg via INTRAVENOUS
  Filled 2021-08-12: qty 48

## 2021-08-12 NOTE — Progress Notes (Signed)
Patients port flushed without difficulty.  Good blood return noted with no bruising or swelling noted at site.  Stable during access and blood draw.  Patient to remain accessed for treatment. 

## 2021-08-12 NOTE — Progress Notes (Signed)
Patient presents today for Cisplatin/Imfinzi/Gemzar infusion per providers order.  Message received from Anastasio Champion RN/Dr. Delton Coombes patient okay for treatment.  Patient has voided 200 cc urine.  Cisplatin/Imfinzi/Gemzar infusion given today per MD orders.  Stable during infusion without adverse affects.  Vital signs stable.  No complaints at this time.  Discharge from clinic ambulatory in stable condition.  Alert and oriented X 3.  Follow up with [facility] as scheduled.

## 2021-08-12 NOTE — Patient Instructions (Signed)
[place] at [facility] Discharge Instructions   You were seen and examined today by Dr. Delton Coombes. He reviewed your lab work which is stable/improved. We will proceed with your treatment today. Return as scheduled for lab work, office visit, and treatment.    Thank you for choosing [place] at [facility] to provide your oncology and hematology care.  To afford each patient quality time with our provider, please arrive at least 15 minutes before your scheduled appointment time.   If you have a lab appointment with the [facility] please come in thru the Main Entrance and check in at the main information desk.  You need to re-schedule your appointment should you arrive 10 or more minutes late.  We strive to give you quality time with our providers, and arriving late affects you and other patients whose appointments are after yours.  Also, if you no show three or more times for appointments you may be dismissed from the clinic at the providers discretion.     Again, thank you for choosing [facility].  Our hope is that these requests will decrease the amount of time that you wait before being seen by our physicians.       _____________________________________________________________  Should you have questions after your visit to [facility], please contact our office at [phone] and follow the prompts.  Our office hours are 8:00 a.m. and 4:30 p.m. Monday - Friday.  Please note that voicemails left after 4:00 p.m. may not be returned until the following business day.  We are closed weekends and major holidays.  You do have access to a nurse 24-7, just call the main number to the clinic [phone] and do not press any options, hold on the line and a nurse will answer the phone.    For prescription refill requests, have your pharmacy contact our office and allow 72 hours.    Due to Covid, you will need to wear a  mask upon entering the hospital. If you do not have a mask, a mask will be given to you at the Main Entrance upon arrival. For doctor visits, patients may have 1 support person age 43 or older with them. For treatment visits, patients can not have anyone with them due to social distancing guidelines and our immunocompromised population.

## 2021-08-12 NOTE — Patient Instructions (Addendum)
[place]  Discharge Instructions: Thank you for choosing [place] to provide your oncology and hematology care.  If you have a lab appointment with the [facility], please come in thru the Main Entrance and check in at the main information desk.  Wear comfortable clothing and clothing appropriate for easy access to any Portacath or PICC line.   We strive to give you quality time with your provider. You may need to reschedule your appointment if you arrive late (15 or more minutes).  Arriving late affects you and other patients whose appointments are after yours.  Also, if you miss three or more appointments without notifying the office, you may be dismissed from the clinic at the providers discretion.      For prescription refill requests, have your pharmacy contact our office and allow 72 hours for refills to be completed.    Today you received the following chemotherapy and/or immunotherapy agents Imfinzi/Gemzar/Cisplatin      To help prevent nausea and vomiting after your treatment, we encourage you to take your nausea medication as directed.  BELOW ARE SYMPTOMS THAT SHOULD BE REPORTED IMMEDIATELY: *FEVER GREATER THAN 100.4 F (38 C) OR HIGHER *CHILLS OR SWEATING *NAUSEA AND VOMITING THAT IS NOT CONTROLLED WITH YOUR NAUSEA MEDICATION *UNUSUAL SHORTNESS OF BREATH *UNUSUAL BRUISING OR BLEEDING *URINARY PROBLEMS (pain or burning when urinating, or frequent urination) *BOWEL PROBLEMS (unusual diarrhea, constipation, pain near the anus) TENDERNESS IN MOUTH AND THROAT WITH OR WITHOUT PRESENCE OF ULCERS (sore throat, sores in mouth, or a toothache) UNUSUAL RASH, SWELLING OR PAIN  UNUSUAL VAGINAL DISCHARGE OR ITCHING   Items with * indicate a potential emergency and should be followed up as soon as possible or go to the Emergency Department if any problems should occur.  Please show the CHEMOTHERAPY ALERT CARD or IMMUNOTHERAPY ALERT CARD at check-in to the  Emergency Department and triage nurse.  Should you have questions after your visit or need to cancel or reschedule your appointment, please contact [facility] [phone]  and follow the prompts.  Office hours are 8:00 a.m. to 4:30 p.m. Monday - Friday. Please note that voicemails left after 4:00 p.m. may not be returned until the following business day.  We are closed weekends and major holidays. You have access to a nurse at all times for urgent questions. Please call the main number to the clinic [phone] and follow the prompts.  For any non-urgent questions, you may also contact your provider using MyChart. We now offer e-Visits for anyone 18 and older to request care online for non-urgent symptoms. For details visit [email or web address].   Also download the MyChart app! Go to the app store, search "MyChart", open the app, select [place], and log in with your MyChart username and password.  Due to Covid, a mask is required upon entering the hospital/clinic. If you do not have a mask, one will be given to you upon arrival. For doctor visits, patients may have 1 support person aged 62 or older with them. For treatment visits, patients cannot have anyone with them due to current Covid guidelines and our immunocompromised population. [place]  Discharge Instructions: Thank you for choosing [place] to provide your oncology and hematology care.  If you have a lab appointment with the [facility], please come in thru the Main Entrance and check in at the main information desk.  Wear comfortable clothing and clothing appropriate for easy access to any  Portacath or PICC line.   We strive to give you quality time with your provider. You may need to reschedule your appointment if you arrive late (15 or more minutes).  Arriving late affects you and other patients whose appointments are after yours.  Also, if you miss three or more appointments  without notifying the office, you may be dismissed from the clinic at the providers discretion.      For prescription refill requests, have your pharmacy contact our office and allow 72 hours for refills to be completed.    Today you received the following chemotherapy and/or immunotherapy agents cisplatin/imfinzi/gemzar      To help prevent nausea and vomiting after your treatment, we encourage you to take your nausea medication as directed.  BELOW ARE SYMPTOMS THAT SHOULD BE REPORTED IMMEDIATELY: *FEVER GREATER THAN 100.4 F (38 C) OR HIGHER *CHILLS OR SWEATING *NAUSEA AND VOMITING THAT IS NOT CONTROLLED WITH YOUR NAUSEA MEDICATION *UNUSUAL SHORTNESS OF BREATH *UNUSUAL BRUISING OR BLEEDING *URINARY PROBLEMS (pain or burning when urinating, or frequent urination) *BOWEL PROBLEMS (unusual diarrhea, constipation, pain near the anus) TENDERNESS IN MOUTH AND THROAT WITH OR WITHOUT PRESENCE OF ULCERS (sore throat, sores in mouth, or a toothache) UNUSUAL RASH, SWELLING OR PAIN  UNUSUAL VAGINAL DISCHARGE OR ITCHING   Items with * indicate a potential emergency and should be followed up as soon as possible or go to the Emergency Department if any problems should occur.  Please show the CHEMOTHERAPY ALERT CARD or IMMUNOTHERAPY ALERT CARD at check-in to the Emergency Department and triage nurse.  Should you have questions after your visit or need to cancel or reschedule your appointment, please contact [facility] [phone]  and follow the prompts.  Office hours are 8:00 a.m. to 4:30 p.m. Monday - Friday. Please note that voicemails left after 4:00 p.m. may not be returned until the following business day.  We are closed weekends and major holidays. You have access to a nurse at all times for urgent questions. Please call the main number to the clinic [phone] and follow the prompts.  For any non-urgent questions, you may also contact your provider using MyChart. We now offer  e-Visits for anyone 53 and older to request care online for non-urgent symptoms. For details visit [email or web address].   Also download the MyChart app! Go to the app store, search "MyChart", open the app, select [place], and log in with your MyChart username and password.  Due to Covid, a mask is required upon entering the hospital/clinic. If you do not have a mask, one will be given to you upon arrival. For doctor visits, patients may have 1 support person aged 75 or older with them. For treatment visits, patients cannot have anyone with them due to current Covid guidelines and our immunocompromised population.

## 2021-08-12 NOTE — Progress Notes (Signed)
Patient has been examined by Dr. Katragadda, and vital signs and labs have been reviewed. ANC, Creatinine, LFTs, hemoglobin, and platelets are within treatment parameters per M.D. - pt may proceed with treatment.    °

## 2021-08-13 LAB — CANCER ANTIGEN 19-9: CA 19-9: 444 U/mL — ABNORMAL HIGH (ref 0–35)

## 2021-08-19 ENCOUNTER — Inpatient Hospital Stay (HOSPITAL_COMMUNITY): Payer: Medicare Other

## 2021-08-19 ENCOUNTER — Other Ambulatory Visit: Payer: Self-pay

## 2021-08-19 ENCOUNTER — Encounter (HOSPITAL_COMMUNITY): Payer: Self-pay

## 2021-08-19 VITALS — BP 138/76 | HR 94 | Temp 98.0°F | Resp 18

## 2021-08-19 DIAGNOSIS — C221 Intrahepatic bile duct carcinoma: Secondary | ICD-10-CM

## 2021-08-19 DIAGNOSIS — Z5112 Encounter for antineoplastic immunotherapy: Secondary | ICD-10-CM | POA: Diagnosis not present

## 2021-08-19 DIAGNOSIS — Z95828 Presence of other vascular implants and grafts: Secondary | ICD-10-CM

## 2021-08-19 LAB — COMPREHENSIVE METABOLIC PANEL
ALT: 94 U/L — ABNORMAL HIGH (ref 0–44)
AST: 68 U/L — ABNORMAL HIGH (ref 15–41)
Albumin: 3.1 g/dL — ABNORMAL LOW (ref 3.5–5.0)
Alkaline Phosphatase: 668 U/L — ABNORMAL HIGH (ref 38–126)
Anion gap: 8 (ref 5–15)
BUN: 16 mg/dL (ref 8–23)
CO2: 24 mmol/L (ref 22–32)
Calcium: 9 mg/dL (ref 8.9–10.3)
Chloride: 99 mmol/L (ref 98–111)
Creatinine, Ser: 0.87 mg/dL (ref 0.61–1.24)
GFR, Estimated: >60 mL/min (ref >60)
Glucose, Bld: 200 mg/dL — ABNORMAL HIGH (ref 70–99)
Potassium: 4.3 mmol/L (ref 3.5–5.1)
Sodium: 131 mmol/L — ABNORMAL LOW (ref 135–145)
Total Bilirubin: 1.8 mg/dL — ABNORMAL HIGH (ref 0.3–1.2)
Total Protein: 6.9 g/dL (ref 6.5–8.1)

## 2021-08-19 LAB — CBC WITH DIFFERENTIAL/PLATELET
Abs Immature Granulocytes: 0.07 10*3/uL (ref 0.00–0.07)
Basophils Absolute: 0.1 10*3/uL (ref 0.0–0.1)
Basophils Relative: 1 %
Eosinophils Absolute: 0.1 10*3/uL (ref 0.0–0.5)
Eosinophils Relative: 1 %
HCT: 32.9 % — ABNORMAL LOW (ref 39.0–52.0)
Hemoglobin: 11 g/dL — ABNORMAL LOW (ref 13.0–17.0)
Immature Granulocytes: 1 %
Lymphocytes Relative: 16 %
Lymphs Abs: 1.6 10*3/uL (ref 0.7–4.0)
MCH: 31.9 pg (ref 26.0–34.0)
MCHC: 33.4 g/dL (ref 30.0–36.0)
MCV: 95.4 fL (ref 80.0–100.0)
Monocytes Absolute: 1.3 10*3/uL — ABNORMAL HIGH (ref 0.1–1.0)
Monocytes Relative: 13 %
Neutro Abs: 6.9 10*3/uL (ref 1.7–7.7)
Neutrophils Relative %: 68 %
Platelets: 218 10*3/uL (ref 150–400)
RBC: 3.45 MIL/uL — ABNORMAL LOW (ref 4.22–5.81)
RDW: 14.9 % (ref 11.5–15.5)
WBC: 10 10*3/uL (ref 4.0–10.5)
nRBC: 0 % (ref 0.0–0.2)

## 2021-08-19 LAB — MAGNESIUM: Magnesium: 2 mg/dL (ref 1.7–2.4)

## 2021-08-19 MED ORDER — SODIUM CHLORIDE 0.9 % IV SOLN
150.0000 mg | Freq: Once | INTRAVENOUS | Status: AC
  Administered 2021-08-19: 150 mg via INTRAVENOUS
  Filled 2021-08-19: qty 150

## 2021-08-19 MED ORDER — SODIUM CHLORIDE 0.9 % IV SOLN
500.0000 mg/m2 | Freq: Once | INTRAVENOUS | Status: AC
  Administered 2021-08-19: 950 mg via INTRAVENOUS
  Filled 2021-08-19: qty 24.98

## 2021-08-19 MED ORDER — PALONOSETRON HCL INJECTION 0.25 MG/5ML
0.2500 mg | Freq: Once | INTRAVENOUS | Status: AC
  Administered 2021-08-19: 0.25 mg via INTRAVENOUS
  Filled 2021-08-19: qty 5

## 2021-08-19 MED ORDER — SODIUM CHLORIDE 0.9 % IV SOLN
10.0000 mg | Freq: Once | INTRAVENOUS | Status: AC
  Administered 2021-08-19: 10 mg via INTRAVENOUS
  Filled 2021-08-19: qty 10

## 2021-08-19 MED ORDER — MAGNESIUM SULFATE 2 GM/50ML IV SOLN
2.0000 g | Freq: Once | INTRAVENOUS | Status: AC
  Administered 2021-08-19: 2 g via INTRAVENOUS
  Filled 2021-08-19: qty 50

## 2021-08-19 MED ORDER — HEPARIN SOD (PORK) LOCK FLUSH 100 UNIT/ML IV SOLN
500.0000 [IU] | Freq: Once | INTRAVENOUS | Status: AC | PRN
  Administered 2021-08-19: 500 [IU]

## 2021-08-19 MED ORDER — SODIUM CHLORIDE 0.9 % IV SOLN: Freq: Once | INTRAVENOUS | Status: AC

## 2021-08-19 MED ORDER — SODIUM CHLORIDE 0.9% FLUSH
10.0000 mL | INTRAVENOUS | Status: DC | PRN
  Administered 2021-08-19: 10 mL

## 2021-08-19 MED ORDER — POTASSIUM CHLORIDE IN NACL 20-0.9 MEQ/L-% IV SOLN
Freq: Once | INTRAVENOUS | Status: AC
  Filled 2021-08-19: qty 1000

## 2021-08-19 MED ORDER — SODIUM CHLORIDE 0.9 % IV SOLN
25.0000 mg/m2 | Freq: Once | INTRAVENOUS | Status: AC
  Administered 2021-08-19: 48 mg via INTRAVENOUS
  Filled 2021-08-19: qty 48

## 2021-08-19 NOTE — Progress Notes (Signed)
Patient presents today for Gemzar/Cisplatin, Bilirubin is 1.8. Patient has lost 4 lbs since last visit, fatigue is about the same and he says appetite is "so, so" , patient has had runny nose and sinus congestion since Sunday is taking Robitussin, denies  fever or chills. Patient okay for treatment today per Dr. Delton Coombes with a reduced dose of Gemzar.  Patient tolerated chemotherapy with no complaints voiced. Side effects with management reviewed understanding verbalized. Port site clean and dry with no bruising or swelling noted at site. Good blood return noted before and after administration of chemotherapy. Band aid applied. Patient left in satisfactory condition with VSS and no s/s of distress noted.

## 2021-08-19 NOTE — Progress Notes (Signed)
Patient voided 275 mls .

## 2021-08-19 NOTE — Patient Instructions (Signed)
[place]  Discharge Instructions: Thank you for choosing [facility] to provide your oncology and hematology care.  If you have a lab appointment with the [facility], please come in thru the Main Entrance and check in at the main information desk.  Wear comfortable clothing and clothing appropriate for easy access to any Portacath or PICC line.   We strive to give you quality time with your provider. You may need to reschedule your appointment if you arrive late (15 or more minutes).  Arriving late affects you and other patients whose appointments are after yours.  Also, if you miss three or more appointments without notifying the office, you may be dismissed from the clinic at the providers discretion.      For prescription refill requests, have your pharmacy contact our office and allow 72 hours for refills to be completed.    Today you received the following chemotherapy and/or immunotherapy agents Cisplatin and Gemcitabine, return as scheduled.   To help prevent nausea and vomiting after your treatment, we encourage you to take your nausea medication as directed.  BELOW ARE SYMPTOMS THAT SHOULD BE REPORTED IMMEDIATELY: *FEVER GREATER THAN 100.4 F (38 C) OR HIGHER *CHILLS OR SWEATING *NAUSEA AND VOMITING THAT IS NOT CONTROLLED WITH YOUR NAUSEA MEDICATION *UNUSUAL SHORTNESS OF BREATH *UNUSUAL BRUISING OR BLEEDING *URINARY PROBLEMS (pain or burning when urinating, or frequent urination) *BOWEL PROBLEMS (unusual diarrhea, constipation, pain near the anus) TENDERNESS IN MOUTH AND THROAT WITH OR WITHOUT PRESENCE OF ULCERS (sore throat, sores in mouth, or a toothache) UNUSUAL RASH, SWELLING OR PAIN  UNUSUAL VAGINAL DISCHARGE OR ITCHING   Items with * indicate a potential emergency and should be followed up as soon as possible or go to the Emergency Department if any problems should occur.  Please show the CHEMOTHERAPY ALERT CARD or IMMUNOTHERAPY ALERT CARD  at check-in to the Emergency Department and triage nurse.  Should you have questions after your visit or need to cancel or reschedule your appointment, please contact [facility] [phone]  and follow the prompts.  Office hours are 8:00 a.m. to 4:30 p.m. Monday - Friday. Please note that voicemails left after 4:00 p.m. may not be returned until the following business day.  We are closed weekends and major holidays. You have access to a nurse at all times for urgent questions. Please call the main number to the clinic [phone] and follow the prompts.  For any non-urgent questions, you may also contact your provider using MyChart. We now offer e-Visits for anyone 45 and older to request care online for non-urgent symptoms. For details visit [email or web address].   Also download the MyChart app! Go to the app store, search "MyChart", open the app, select [place], and log in with your MyChart username and password.  Due to Covid, a mask is required upon entering the hospital/clinic. If you do not have a mask, one will be given to you upon arrival. For doctor visits, patients may have 1 support person aged 90 or older with them. For treatment visits, patients cannot have anyone with them due to current Covid guidelines and our immunocompromised population.

## 2021-08-20 ENCOUNTER — Inpatient Hospital Stay (HOSPITAL_COMMUNITY): Payer: Medicare Other

## 2021-08-20 ENCOUNTER — Encounter (HOSPITAL_COMMUNITY): Payer: Self-pay

## 2021-08-20 VITALS — BP 152/81 | HR 92 | Temp 97.4°F | Resp 20 | Wt 159.2 lb

## 2021-08-20 DIAGNOSIS — C221 Intrahepatic bile duct carcinoma: Secondary | ICD-10-CM

## 2021-08-20 DIAGNOSIS — Z95828 Presence of other vascular implants and grafts: Secondary | ICD-10-CM

## 2021-08-20 DIAGNOSIS — Z5112 Encounter for antineoplastic immunotherapy: Secondary | ICD-10-CM | POA: Diagnosis not present

## 2021-08-20 MED ORDER — PEGFILGRASTIM-CBQV 6 MG/0.6ML ~~LOC~~ SOSY
6.0000 mg | PREFILLED_SYRINGE | Freq: Once | SUBCUTANEOUS | Status: AC
  Administered 2021-08-20: 6 mg via SUBCUTANEOUS
  Filled 2021-08-20: qty 0.6

## 2021-08-20 NOTE — Progress Notes (Signed)
[name] presents today for injection per the provider's orders.  Udenyca administration without incident; injection site WNL; see MAR for injection details.  Patient tolerated procedure well and without incident.  No questions or complaints noted at this time.Tolerated without adverse affects. Vital signs stable. No complaints at this time. Discharged from clinic ambulatory in stable condition. Alert and oriented x 3. F/U with [facility] as scheduled.

## 2021-08-20 NOTE — Patient Instructions (Signed)
[facility]  Discharge Instructions: Thank you for choosing [facility] to provide your oncology and hematology care.  If you have a lab appointment with the Cancer Center, please come in thru the Main Entrance and check in at the main information desk.  Wear comfortable clothing and clothing appropriate for easy access to any Portacath or PICC line.   We strive to give you quality time with your provider. You may need to reschedule your appointment if you arrive late (15 or more minutes).  Arriving late affects you and other patients whose appointments are after yours.  Also, if you miss three or more appointments without notifying the office, you may be dismissed from the clinic at the provider's discretion.      For prescription refill requests, have your pharmacy contact our office and allow 72 hours for refills to be completed.    Today you received the following:  Udenyca injection      To help prevent nausea and vomiting after your treatment, we encourage you to take your nausea medication as directed.  BELOW ARE SYMPTOMS THAT SHOULD BE REPORTED IMMEDIATELY: *FEVER GREATER THAN 100.4 F (38 C) OR HIGHER *CHILLS OR SWEATING *NAUSEA AND VOMITING THAT IS NOT CONTROLLED WITH YOUR NAUSEA MEDICATION *UNUSUAL SHORTNESS OF BREATH *UNUSUAL BRUISING OR BLEEDING *URINARY PROBLEMS (pain or burning when urinating, or frequent urination) *BOWEL PROBLEMS (unusual diarrhea, constipation, pain near the anus) TENDERNESS IN MOUTH AND THROAT WITH OR WITHOUT PRESENCE OF ULCERS (sore throat, sores in mouth, or a toothache) UNUSUAL RASH, SWELLING OR PAIN  UNUSUAL VAGINAL DISCHARGE OR ITCHING   Items with * indicate a potential emergency and should be followed up as soon as possible or go to the Emergency Department if any problems should occur.  Please show the CHEMOTHERAPY ALERT CARD or IMMUNOTHERAPY ALERT CARD at check-in to the Emergency Department and triage  nurse.  Should you have questions after your visit or need to cancel or reschedule your appointment, please contact [facility] [phone]  and follow the prompts.  Office hours are 8:00 a.m. to 4:30 p.m. Monday - Friday. Please note that voicemails left after 4:00 p.m. may not be returned until the following business day.  We are closed weekends and major holidays. You have access to a nurse at all times for urgent questions. Please call the main number to the clinic [phone] and follow the prompts.  For any non-urgent questions, you may also contact your provider using MyChart. We now offer e-Visits for anyone 18 and older to request care online for non-urgent symptoms. For details visit [email or web address].   Also download the MyChart app! Go to the app store, search "MyChart", open the app, select [place], and log in with your MyChart username and password.  Due to Covid, a mask is required upon entering the hospital/clinic. If you do not have a mask, one will be given to you upon arrival. For doctor visits, patients may have 1 support person aged 18 or older with them. For treatment visits, patients cannot have anyone with them due to current Covid guidelines and our immunocompromised population.  

## 2021-08-26 ENCOUNTER — Other Ambulatory Visit (HOSPITAL_COMMUNITY): Payer: Medicare Other

## 2021-08-26 ENCOUNTER — Ambulatory Visit (HOSPITAL_COMMUNITY): Payer: Medicare Other

## 2021-09-01 ENCOUNTER — Encounter (INDEPENDENT_AMBULATORY_CARE_PROVIDER_SITE_OTHER): Payer: Self-pay | Admitting: Gastroenterology

## 2021-09-01 ENCOUNTER — Other Ambulatory Visit: Payer: Self-pay

## 2021-09-01 ENCOUNTER — Ambulatory Visit (INDEPENDENT_AMBULATORY_CARE_PROVIDER_SITE_OTHER): Payer: Medicare Other | Admitting: Gastroenterology

## 2021-09-01 VITALS — Ht 71.0 in | Wt 159.0 lb

## 2021-09-01 DIAGNOSIS — C221 Intrahepatic bile duct carcinoma: Secondary | ICD-10-CM | POA: Diagnosis not present

## 2021-09-01 DIAGNOSIS — R110 Nausea: Secondary | ICD-10-CM | POA: Insufficient documentation

## 2021-09-01 DIAGNOSIS — R112 Nausea with vomiting, unspecified: Secondary | ICD-10-CM | POA: Insufficient documentation

## 2021-09-01 NOTE — Patient Instructions (Addendum)
I am glad that you seem to be doing fairly well considering all you are going through. Please discuss continued nausea with Dr. Raliegh Ip as this is likely a side effect of your chemo. As for you a1c, your blood sugars appear to have been in the mid to high 100s-200 range recently which is not terrible considering you are on steroids, I would reach out to PCP and see if they can order the a1c for you to get drawn when you have labs done at the cancer center next time.  Please continue to stay well hydrated and eat what you can tolerate.  At this time, we will plan to follow up with you on as needed basis, if something changes or you develop any new GI symptoms, you can give [place] a call.

## 2021-09-01 NOTE — Progress Notes (Signed)
° °Primary Care Physician:  Mann, Benjamin L, PA-C  °Primary GI: Dr. Castaneda ° °Patient Location: Home °  °Provider Location: [facility] °  °Reason for Visit: follow up of cholangiocarcinoma °  °Persons present on the virtual encounter, with roles: Jonathan Keith, patient, Jonathan Keith, patient's wife, Jonathan Keith, provider °  °Total time (minutes) spent on medical discussion: 15 minutes °  °visit was conducted using virtual method.  Visit was requested by patient. ° °Virtual Visit via telephone ° visit is conducted virtually and was requested by patient.  ° °I connected with Jonathan Keith on 09/01/21 at  3:30 PM EST by telephone and verified that I am speaking with the correct person using two identifiers. °  °I discussed the limitations, risks, security and privacy concerns of performing an evaluation and management service by telephone and the availability of in person appointments. I also discussed with the patient that there may be a patient responsible charge related to this service. The patient expressed understanding and agreed to proceed. ° °Chief Complaint  °Patient presents with  ° Nausea  °  Patient doing a phone visit for a 6 month followup. States she has some nausea and no appetite.   ° °History of Present Illness: °Jonathan Keith is a 65 year old male with complicated history of HTN, DM, Cholangiocarcinoma s/p biliary stenting and portal vein embolization with surgical intervention planned as an outpatient in [facility] in [place] in Nov 2022, complicated by recurrent Pseudomonas bacteremia and Enterococcus bacteremia requiring IV abx and recent exchange of biliary stents at [facility] on 05/27/21, who comes in for follow up.  ° °Most recent hospitalization 05/25/21 for fevers and abd pain, had stopped taking abx as [facility], findings at that time concerning for cholangitis as he had elevated WBC of 11.9, lactic acid of 2 with elevation of LFTs with AST 91, ALT  109, Alk Phos of 716 and T bili 4.6. CT A/P with contrast did not show any acute abnormalities. Started on iv zosyn and transferred for an ERCP with Dr. Todd Baron at [facility], he underwent biliary stent exchange and was transferred back to [facility], discharged on Augmenting and Cipro.  ° °Last clinic visit was Jun 12, 2021, CBC and CMP checked thereafter ( T bili 1.7, ALk Phos 454, AST 68, ALT 61, albumin 3.7 and WBC 8.3) with advisement to continue on cipro and augmentin until he was seen back at [facility] for R hepatectomy. He was also prescribed levins Q6H for abdominal pain. He began palliative chemo with gemcitabine, cisplastin and duvalumab on 07/22/21 and is currently following with Dr. Katragadda at the cancer center, last visit was 08/12/21 ° °Today,  he states that he went back to [facility] in November and was told that they could not proceed with hepatectomy as his cholangiocarcinoma had progressed too much for surgical intervention, wife reports they did change out his biliary stent to a metal stent and he has not had any issues with stent since then, states that he was discontinued off of antibiotics thereafter. He started following with oncology where he does two weeks of chemo and then has one week off then repeats. He is also receiving injections to boost his WBC. Has follow up appt with Dr K tomorrow. Last labs on 08/19/21 with T bili 1.8, AST 68, ALT 94, Alk phos 668, albumin 3.1, WBC 10, Plt 218k, CA19-9 444 (down from 752 one month ago).  ° °  Wife states that he continues to have a lot of ongoing nausea which he takes compazine for, he is unsure if this helps. Though he has been able to tolerate more foods with recent weight gain of about 3 pounds, previously lost about 37 pounds over the past 6 months prior to starting chemo. He was previously prescribed Megace 400mg BID for his appetite but did not start taking it as he started having spontaneous improvement in his appetite.   He reports that he feels that foods taste/smell different since starting chemo. Wife states that he seems to have worsening nausea during times of injection for WBC. He is eating better than previously, wife states that she gets him whatever food he feels like eating. He has not experienced any episodes of emesis. Denies any abdominal pain or fevers, no chills. He denies any rectal bleeding, does have some green stools related to his iron pills, no constipation. Overall he is doing relatively well. Still continues to have fatigue since starting chemo, is on ritalin 5mg daily for this which has helped some. His wife does report they are concerned about his blood sugars as he is diabetic and also on steroids with his chemo, blood sugars have been in the high 100s/low 200s, she inquired about checking A1C.  ° °Past Medical History:  °Diagnosis Date  ° Chronic pain   ° neck, back, knees  ° Class 1 obesity due to excess calories with body mass index (BMI) of 31.0 to 31.9 in adult   ° Claudication (HCC)   ° DDD (degenerative disc disease), cervical   ° Diabetes mellitus (HCC)   ° Dyslipidemia   ° Hypertension   ° Port-A-Cath in place 07/20/2021  ° Tobacco dependence   ° ° ° °Past Surgical History:  °Procedure Laterality Date  ° BILIARY BRUSHING N/A 02/25/2021  ° Procedure: BILIARY BRUSHING;  Surgeon: Rehman, Najeeb U, MD;  Location: [facility];  Service: Gastroenterology;  Laterality: N/A;  ° BILIARY STENT PLACEMENT N/A 02/25/2021  ° Procedure: BILIARY STENT PLACEMENT;  Surgeon: Rehman, Najeeb U, MD;  Location: [facility];  Service: Gastroenterology;  Laterality: N/A;  ° BILIARY STENT PLACEMENT  02/27/2021  ° Procedure: BILIARY STENT PLACEMENT (10FR x 9cm) IN THE RIGHT SYSTEM;  Surgeon: Rehman, Najeeb U, MD;  Location: [facility];  Service: Gastroenterology;;  ° BREAST SURGERY Left   ° benign lump- in his 20s.  ° ERCP N/A 02/25/2021  ° Procedure: ENDOSCOPIC RETROGRADE CHOLANGIOPANCREATOGRAPHY (ERCP);  Surgeon: Rehman, Najeeb U, MD;   Location: [facility];  Service: Gastroenterology;  Laterality: N/A;  ° ERCP N/A 02/27/2021  ° Procedure: ENDOSCOPIC RETROGRADE CHOLANGIOPANCREATOGRAPHY (ERCP);  Surgeon: Rehman, Najeeb U, MD;  Location: [facility];  Service: Gastroenterology;  Laterality: N/A;  ° IR IMAGING GUIDED PORT INSERTION  07/21/2021  ° KNEE SURGERY Left   ° x2  ° SPHINCTEROTOMY N/A 02/25/2021  ° Procedure: SPHINCTEROTOMY;  Surgeon: Rehman, Najeeb U, MD;  Location: [facility];  Service: Gastroenterology;  Laterality: N/A;  ° STENT REMOVAL  02/27/2021  ° Procedure: STENT REMOVAL (8.5Fr x 9cm);  Surgeon: Rehman, Najeeb U, MD;  Location: [facility];  Service: Gastroenterology;;  ° ° °Current Meds  °Medication Sig  ° acetaminophen (TYLENOL) 500 MG tablet Take 1,000 mg by mouth every 6 (six) hours as needed for moderate pain or headache.  ° Dextromethorphan-guaiFENesin (ROBITUSSIN DM PO) Take by mouth. 2 tsp at night as needed  ° famotidine (PEPCID) 20 MG tablet Take 20 mg by mouth daily.  ° ferrous sulfate (  FERROUSUL) 325 (65 FE) MG tablet Take 1 tablet (325 mg total) by mouth daily with breakfast.   lidocaine-prilocaine (EMLA) cream Apply a small amount to port a cath site and cover with plastic wrap 1 hour prior to chemotherapy appointments   loratadine (CLARITIN) 10 MG tablet Take 10 mg by mouth daily. Takes for a few days after shot to boost WBC. About every 2nd treatment   metFORMIN (GLUCOPHAGE) 500 MG tablet Take 1 tablet (500 mg total) by mouth 2 (two) times daily with a meal.   methylphenidate (RITALIN) 5 MG tablet Take 1 tablet (5 mg total) by mouth daily as needed.   prochlorperazine (COMPAZINE) 10 MG tablet Take 1 tablet (10 mg total) by mouth every 6 (six) hours as needed (Nausea or vomiting).   tamsulosin (FLOMAX) 0.4 MG CAPS capsule Take 1 capsule (0.4 mg total) by mouth at bedtime.     Family History  Problem Relation Age of Onset   CAD Mother    Cancer Neg Hx     Social History   Socioeconomic History   Marital status: Married     Spouse name: Not on file   Number of children: Not on file   Years of education: Not on file   Highest education level: Not on file  Occupational History   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  Tobacco Use   Smoking status: Every Day    Packs/day: 0.50    Years: 56.00    Pack years: 28.00    Types: Cigarettes   Smokeless tobacco: Never  Vaping Use   Vaping Use: Never used  Substance and Sexual Activity   Alcohol use: Not Currently    Comment: stopped 2.5 years ago 03/11/21   Drug use: Never   Sexual activity: Not on file  Other Topics Concern   Not on file  Social History Narrative   Not on file   Social Determinants of Health   Financial Resource Strain: Medium Risk   Difficulty of Paying Living Expenses: Somewhat hard  Food Insecurity: No Food Insecurity   Worried About Charity fundraiser in the Last Year: Never true   Ran Out of Food in the Last Year: Never true  Transportation Needs: No Transportation Needs   Lack of Transportation (Medical): No   Lack of Transportation (Non-Medical): No  Physical Activity: Insufficiently Active   Days of Exercise per Week: 2 days   Minutes of Exercise per Session: 20 min  Stress: No Stress Concern Present   Feeling of Stress : Only a little  Social Connections: Moderately Integrated   Frequency of Communication with Friends and Family: More than three times a week   Frequency of Social Gatherings with Friends and Family: More than three times a week   Attends Religious Services: More than 4 times per year   Active Member of [employer] or Organizations: No   Attends Music therapist: Never   Marital Status: Married   Review of Systems: Gen: Denies fever, chills, anorexia. +fatigue  CV: Denies chest pain, palpitations, syncope, peripheral edema, and claudication. Resp: Denies dyspnea at rest, cough, wheezing, coughing up blood, and pleurisy. GI: see HPI Derm: Denies rash, itching, dry skin Psych: Denies depression,  anxiety, memory loss, confusion. No homicidal or suicidal ideation.  Heme: Denies bruising, bleeding, and enlarged lymph nodes.  Observations/Objective: No distress. Unable to perform physical exam due to telephone encounter. No video available.   Assessment and Plan: SHANTA DORVIL is a 66 year old with  past medical history complicated by cholangiocarcinoma with multiple biliary stent replacements r/t cholangitis with Psuedomonoas and Enterococcus, currently on palliative Chemotherapy, followed by Dr Katragadda as hepatectomy with [facility] could not be performed as initially planned, due to progression of disease.  ° °Today, patient states that he is doing relatively well considering, LFTs continue to fluctuate but are being routinely checked by Heme/Onc. He has had no recent abdominal pain, diarrhea, constipation, jaundice, vomiting, or fevers. Appetite is decent and he has gained 3 pounds back recently. He does still experience nausea and is unsure if compazine is working for him, though he feels that chemo has changed his smell/taste as foods tend to smell and taste different than they previously did. I have advised him to discuss ongoing nausea with Hematology as this is likely a side effect of his chemo and they may have further recommendations for which anti emetics would be best in this regard.  ° °Patient and wife are concerned about patient's blood sugars as he is diabetic, on metformin and also on steroids with his chemo. Blood sugars appear to be in the mid to high 100s and low 200s over the past few months, though no recent A1C available for review. I discussed with patient and wife that I am happy to check his A1C, however, I cannot guarantee that insurance will cover me checking it and if it were elevated, I would need to defer to his PCP for further management. I recommended that they see if PCP would be willing to order a1c and see if he could take requisition for lab test with him to the  cancer center and possibly have this drawn with routine labs prior to chemo.  ° °As patient is follow with hematology for further management of cholangiocarcinoma, we will plan to see him on as needed basis at this time.  ° °Follow Up Instructions: °Follow up as needed ° °I discussed the assessment and treatment plan with the patient. The patient was provided an opportunity to ask questions and all were answered. The patient agreed with the plan and demonstrated an understanding of the instructions. °  °The patient was advised to call back or seek an in-person evaluation if the symptoms worsen or if the condition fails to improve as anticipated. ° °I provided 15 minutes of telephone time ° °Jonathan L. Carlan, MSN, APRN, AGNP-C °Adult-Gerontology Nurse Practitioner °[facility] ° °

## 2021-09-02 ENCOUNTER — Inpatient Hospital Stay (HOSPITAL_COMMUNITY): Payer: Medicare Other

## 2021-09-02 ENCOUNTER — Inpatient Hospital Stay (HOSPITAL_BASED_OUTPATIENT_CLINIC_OR_DEPARTMENT_OTHER): Payer: Medicare Other | Admitting: Hematology

## 2021-09-02 VITALS — BP 135/75 | HR 82 | Temp 97.2°F | Resp 18 | Wt 154.8 lb

## 2021-09-02 VITALS — BP 130/80 | HR 102 | Temp 96.5°F | Resp 18

## 2021-09-02 DIAGNOSIS — R978 Other abnormal tumor markers: Secondary | ICD-10-CM

## 2021-09-02 DIAGNOSIS — Z95828 Presence of other vascular implants and grafts: Secondary | ICD-10-CM

## 2021-09-02 DIAGNOSIS — C221 Intrahepatic bile duct carcinoma: Secondary | ICD-10-CM

## 2021-09-02 DIAGNOSIS — Z5112 Encounter for antineoplastic immunotherapy: Secondary | ICD-10-CM | POA: Diagnosis not present

## 2021-09-02 LAB — CBC WITH DIFFERENTIAL/PLATELET
Abs Immature Granulocytes: 0.19 10*3/uL — ABNORMAL HIGH (ref 0.00–0.07)
Basophils Absolute: 0.1 10*3/uL (ref 0.0–0.1)
Basophils Relative: 1 %
Eosinophils Absolute: 0.2 10*3/uL (ref 0.0–0.5)
Eosinophils Relative: 1 %
HCT: 32.8 % — ABNORMAL LOW (ref 39.0–52.0)
Hemoglobin: 11.1 g/dL — ABNORMAL LOW (ref 13.0–17.0)
Immature Granulocytes: 1 %
Lymphocytes Relative: 13 %
Lymphs Abs: 1.8 10*3/uL (ref 0.7–4.0)
MCH: 32.6 pg (ref 26.0–34.0)
MCHC: 33.8 g/dL (ref 30.0–36.0)
MCV: 96.5 fL (ref 80.0–100.0)
Monocytes Absolute: 1.3 10*3/uL — ABNORMAL HIGH (ref 0.1–1.0)
Monocytes Relative: 9 %
Neutro Abs: 10.2 10*3/uL — ABNORMAL HIGH (ref 1.7–7.7)
Neutrophils Relative %: 75 %
Platelets: 290 10*3/uL (ref 150–400)
RBC: 3.4 MIL/uL — ABNORMAL LOW (ref 4.22–5.81)
RDW: 17 % — ABNORMAL HIGH (ref 11.5–15.5)
WBC: 13.7 10*3/uL — ABNORMAL HIGH (ref 4.0–10.5)
nRBC: 0 % (ref 0.0–0.2)

## 2021-09-02 LAB — COMPREHENSIVE METABOLIC PANEL
ALT: 58 U/L — ABNORMAL HIGH (ref 0–44)
AST: 52 U/L — ABNORMAL HIGH (ref 15–41)
Albumin: 3.1 g/dL — ABNORMAL LOW (ref 3.5–5.0)
Alkaline Phosphatase: 623 U/L — ABNORMAL HIGH (ref 38–126)
Anion gap: 6 (ref 5–15)
BUN: 10 mg/dL (ref 8–23)
CO2: 24 mmol/L (ref 22–32)
Calcium: 8.8 mg/dL — ABNORMAL LOW (ref 8.9–10.3)
Chloride: 101 mmol/L (ref 98–111)
Creatinine, Ser: 0.84 mg/dL (ref 0.61–1.24)
GFR, Estimated: >60 mL/min (ref >60)
Glucose, Bld: 203 mg/dL — ABNORMAL HIGH (ref 70–99)
Potassium: 3.8 mmol/L (ref 3.5–5.1)
Sodium: 131 mmol/L — ABNORMAL LOW (ref 135–145)
Total Bilirubin: 1.1 mg/dL (ref 0.3–1.2)
Total Protein: 6.7 g/dL (ref 6.5–8.1)

## 2021-09-02 LAB — MAGNESIUM: Magnesium: 1.8 mg/dL (ref 1.7–2.4)

## 2021-09-02 MED ORDER — SODIUM CHLORIDE 0.9 % IV SOLN
1500.0000 mg | Freq: Once | INTRAVENOUS | Status: AC
  Administered 2021-09-02: 12:00:00 1500 mg via INTRAVENOUS
  Filled 2021-09-02: qty 30

## 2021-09-02 MED ORDER — SODIUM CHLORIDE 0.9 % IV SOLN
150.0000 mg | Freq: Once | INTRAVENOUS | Status: AC
  Administered 2021-09-02: 10:00:00 150 mg via INTRAVENOUS
  Filled 2021-09-02: qty 150

## 2021-09-02 MED ORDER — HEPARIN SOD (PORK) LOCK FLUSH 100 UNIT/ML IV SOLN
500.0000 [IU] | Freq: Once | INTRAVENOUS | Status: AC | PRN
  Administered 2021-09-02: 16:00:00 500 [IU]

## 2021-09-02 MED ORDER — SODIUM CHLORIDE 0.9 % IV SOLN
25.0000 mg/m2 | Freq: Once | INTRAVENOUS | Status: AC
  Administered 2021-09-02: 14:00:00 48 mg via INTRAVENOUS
  Filled 2021-09-02: qty 48

## 2021-09-02 MED ORDER — MAGNESIUM SULFATE 2 GM/50ML IV SOLN
2.0000 g | Freq: Once | INTRAVENOUS | Status: AC
  Administered 2021-09-02: 11:00:00 2 g via INTRAVENOUS
  Filled 2021-09-02: qty 50

## 2021-09-02 MED ORDER — SODIUM CHLORIDE 0.9 % IV SOLN
700.0000 mg/m2 | Freq: Once | INTRAVENOUS | Status: AC
  Administered 2021-09-02: 13:00:00 1330 mg via INTRAVENOUS
  Filled 2021-09-02: qty 10.52

## 2021-09-02 MED ORDER — SODIUM CHLORIDE 0.9 % IV SOLN: Freq: Once | INTRAVENOUS | Status: AC

## 2021-09-02 MED ORDER — SODIUM CHLORIDE 0.9 % IV SOLN
10.0000 mg | Freq: Once | INTRAVENOUS | Status: AC
  Administered 2021-09-02: 10:00:00 10 mg via INTRAVENOUS
  Filled 2021-09-02: qty 1

## 2021-09-02 MED ORDER — PALONOSETRON HCL INJECTION 0.25 MG/5ML
0.2500 mg | Freq: Once | INTRAVENOUS | Status: AC
  Administered 2021-09-02: 10:00:00 0.25 mg via INTRAVENOUS
  Filled 2021-09-02: qty 5

## 2021-09-02 MED ORDER — SODIUM CHLORIDE 0.9% FLUSH
10.0000 mL | INTRAVENOUS | Status: DC | PRN
  Administered 2021-09-02 (×2): 10 mL

## 2021-09-02 MED ORDER — POTASSIUM CHLORIDE IN NACL 20-0.9 MEQ/L-% IV SOLN
Freq: Once | INTRAVENOUS | Status: AC
  Filled 2021-09-02: qty 1000

## 2021-09-02 NOTE — Patient Instructions (Signed)
[facility]  Discharge Instructions: Thank you for choosing [facility] to provide your oncology and hematology care.  If you have a lab appointment with the Cancer Center, please come in thru the Main Entrance and check in at the main information desk.  Wear comfortable clothing and clothing appropriate for easy access to any Portacath or PICC line.   We strive to give you quality time with your provider. You may need to reschedule your appointment if you arrive late (15 or more minutes).  Arriving late affects you and other patients whose appointments are after yours.  Also, if you miss three or more appointments without notifying the office, you may be dismissed from the clinic at the provider's discretion.      For prescription refill requests, have your pharmacy contact our office and allow 72 hours for refills to be completed.        To help prevent nausea and vomiting after your treatment, we encourage you to take your nausea medication as directed.  BELOW ARE SYMPTOMS THAT SHOULD BE REPORTED IMMEDIATELY: *FEVER GREATER THAN 100.4 F (38 C) OR HIGHER *CHILLS OR SWEATING *NAUSEA AND VOMITING THAT IS NOT CONTROLLED WITH YOUR NAUSEA MEDICATION *UNUSUAL SHORTNESS OF BREATH *UNUSUAL BRUISING OR BLEEDING *URINARY PROBLEMS (pain or burning when urinating, or frequent urination) *BOWEL PROBLEMS (unusual diarrhea, constipation, pain near the anus) TENDERNESS IN MOUTH AND THROAT WITH OR WITHOUT PRESENCE OF ULCERS (sore throat, sores in mouth, or a toothache) UNUSUAL RASH, SWELLING OR PAIN  UNUSUAL VAGINAL DISCHARGE OR ITCHING   Items with * indicate a potential emergency and should be followed up as soon as possible or go to the Emergency Department if any problems should occur.  Please show the CHEMOTHERAPY ALERT CARD or IMMUNOTHERAPY ALERT CARD at check-in to the Emergency Department and triage nurse.  Should you have questions after your visit or need to cancel  or reschedule your appointment, please contact [facility] [phone]  and follow the prompts.  Office hours are 8:00 a.m. to 4:30 p.m. Monday - Friday. Please note that voicemails left after 4:00 p.m. may not be returned until the following business day.  We are closed weekends and major holidays. You have access to a nurse at all times for urgent questions. Please call the main number to the clinic [phone] and follow the prompts.  For any non-urgent questions, you may also contact your provider using MyChart. We now offer e-Visits for anyone 18 and older to request care online for non-urgent symptoms. For details visit [email or web address].   Also download the MyChart app! Go to the app store, search "MyChart", open the app, select [facility], and log in with your MyChart username and password.  Due to Covid, a mask is required upon entering the hospital/clinic. If you do not have a mask, one will be given to you upon arrival. For doctor visits, patients may have 1 support person aged 18 or older with them. For treatment visits, patients cannot have anyone with them due to current Covid guidelines and our immunocompromised population.  

## 2021-09-02 NOTE — Progress Notes (Signed)
Patient tolerated chemotherapy with no complaints voiced.  Side effects with management reviewed with understanding verbalized.  Port site clean and dry with no bruising or swelling noted at site.  Good blood return noted before and after administration of chemotherapy.  Band aid applied.  Patient left in satisfactory condition with VSS and no s/s of distress noted.   

## 2021-09-02 NOTE — Progress Notes (Signed)
Jonathan Keith, [place] [id number]   CLINIC:  Medical Oncology/Hematology  PCP:  [name], PA-C [place] / [place] [address] [phone]   REASON FOR VISIT:  Follow-up for intrahepatic cholangiocarcinoma  PRIOR THERAPY: none  NGS Results: not done  CURRENT THERAPY: Cisplatin + Gemcitabine D1,8 q21d  BRIEF ONCOLOGIC HISTORY:  Oncology History  Cholangiocarcinoma ([facility])  02/28/2021 Initial Diagnosis   Cholangiocarcinoma ([facility])   07/17/2021 Cancer Staging   Staging form: Perihilar Bile Ducts, AJCC 8th Edition - Clinical stage from 07/17/2021: Stage IVB (cTX, cNX, pM1) - Signed by Derek Jack, MD on 07/17/2021 Stage prefix: Initial diagnosis    07/22/2021 -  Chemotherapy   Patient is on Treatment Plan : BILIARY TRACT Cisplatin + Gemcitabine D1,8 q21d       CANCER STAGING:  Cancer Staging  Cholangiocarcinoma ([facility]) Staging form: Perihilar Bile Ducts, AJCC 8th Edition - Clinical stage from 07/17/2021: Stage IVB (cTX, cNX, pM1) - Signed by Derek Jack, MD on 07/17/2021   INTERVAL HISTORY:  Jonathan Keith, a 66 y.o. male, returns for routine follow-up and consideration for next cycle of chemotherapy. [name] was last seen on 08/12/2021.  Due for cycle #3 of Cisplatin + Gemcitabine today.   Overall, he tells me he has been feeling pretty well. He reports intermittent nausea and denies vomiting. He reports his smell has changed and "nothing smells right" which has caused a decrease in appetite. He denies tingling/numbness and tinnitus. He denies skin rash, SOB, abdominal pain, new pain, and diarrhea. He has not been drinking Boost as he reports he does not like the taste. He has lost 5 lbs since his last visit.   Overall, he feels ready for next cycle of chemo today.    REVIEW OF SYSTEMS:  Review of Systems  Constitutional:  Positive for appetite change, fatigue and unexpected weight change (-5  lbs).  HENT:   Negative for tinnitus.   Respiratory:  Negative for shortness of breath.   Gastrointestinal:  Positive for nausea. Negative for abdominal pain and diarrhea.  Genitourinary:  Positive for frequency.   Musculoskeletal:  Negative for arthralgias and myalgias.  Skin:  Negative for rash.  Neurological:  Positive for dizziness. Negative for numbness.  Psychiatric/Behavioral:  Positive for sleep disturbance.   All other systems reviewed and are negative.  PAST MEDICAL/SURGICAL HISTORY:  Past Medical History:  Diagnosis Date   Chronic pain    neck, back, knees   Class 1 obesity due to excess calories with body mass index (BMI) of 31.0 to 31.9 in adult    Claudication [facility])    DDD (degenerative disc disease), cervical    Diabetes mellitus ([place])    Dyslipidemia    Hypertension    Port-A-Cath in place 07/20/2021   Tobacco dependence    Past Surgical History:  Procedure Laterality Date   BILIARY BRUSHING N/A 02/25/2021   Procedure: BILIARY BRUSHING;  Surgeon: Rogene Houston, MD;  Location: [facility];  Service: Gastroenterology;  Laterality: N/A;   BILIARY STENT PLACEMENT N/A 02/25/2021   Procedure: BILIARY STENT PLACEMENT;  Surgeon: Rogene Houston, MD;  Location: [facility];  Service: Gastroenterology;  Laterality: N/A;   BILIARY STENT PLACEMENT  02/27/2021   Procedure: BILIARY STENT PLACEMENT (10FR x 9cm) IN THE RIGHT SYSTEM;  Surgeon: Rogene Houston, MD;  Location: [facility];  Service: Gastroenterology;;   BREAST SURGERY Left    benign lump- in his 74s.  ERCP N/A 02/25/2021   Procedure: ENDOSCOPIC RETROGRADE CHOLANGIOPANCREATOGRAPHY (ERCP);  Surgeon: Rogene Houston, MD;  Location: [facility];  Service: Gastroenterology;  Laterality: N/A;   ERCP N/A 02/27/2021   Procedure: ENDOSCOPIC RETROGRADE CHOLANGIOPANCREATOGRAPHY (ERCP);  Surgeon: Rogene Houston, MD;  Location: [facility];  Service: Gastroenterology;  Laterality: N/A;   IR IMAGING GUIDED PORT INSERTION  07/21/2021   KNEE SURGERY  Left    x2   SPHINCTEROTOMY N/A 02/25/2021   Procedure: SPHINCTEROTOMY;  Surgeon: Rogene Houston, MD;  Location: [facility];  Service: Gastroenterology;  Laterality: N/A;   STENT REMOVAL  02/27/2021   Procedure: STENT REMOVAL (8.5Fr x 9cm);  Surgeon: Rogene Houston, MD;  Location: [facility];  Service: Gastroenterology;;    SOCIAL HISTORY:  Social History   Socioeconomic History   Marital status: Married    Spouse name: Not on file   Number of children: Not on file   Years of education: Not on file   Highest education level: Not on file  Occupational History   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  Tobacco Use   Smoking status: Every Day    Packs/day: 0.50    Years: 56.00    Pack years: 28.00    Types: Cigarettes   Smokeless tobacco: Never  Vaping Use   Vaping Use: Never used  Substance and Sexual Activity   Alcohol use: Not Currently    Comment: stopped 2.5 years ago 03/11/21   Drug use: Never   Sexual activity: Not on file  Other Topics Concern   Not on file  Social History Narrative   Not on file   Social Determinants of Health   Financial Resource Strain: Medium Risk   Difficulty of Paying Living Expenses: Somewhat hard  Food Insecurity: No Food Insecurity   Worried About Charity fundraiser in the Last Year: Never true   [place] in the Last Year: Never true  Transportation Needs: No Transportation Needs   Lack of Transportation (Medical): No   Lack of Transportation (Non-Medical): No  Physical Activity: Insufficiently Active   Days of Exercise per Week: 2 days   Minutes of Exercise per Session: 20 min  Stress: No Stress Concern Present   Feeling of Stress : Only a little  Social Connections: Moderately Integrated   Frequency of Communication with Friends and Family: More than three times a week   Frequency of Social Gatherings with Friends and Family: More than three times a week   Attends Religious Services: More than 4 times per year   Active Member of  [employer] or Organizations: No   Attends Music therapist: Never   Marital Status: Married  Human resources officer Violence: Not At Risk   Fear of Current or Ex-Partner: No   Emotionally Abused: No   Physically Abused: No   Sexually Abused: No    FAMILY HISTORY:  Family History  Problem Relation Age of Onset   CAD Mother    Cancer Neg Hx     CURRENT MEDICATIONS:  Current Outpatient Medications  Medication Sig Dispense Refill   CISPLATIN IV Inject into the vein once a week. Days 1, 8 every 21 days     Dextromethorphan-guaiFENesin (ROBITUSSIN DM PO) Take by mouth. 2 tsp at night as needed     Durvalumab (IMFINZI IV) Inject into the vein every 21 ( twenty-one) days.     famotidine (PEPCID) 20 MG tablet Take 20 mg by mouth daily.     ferrous sulfate (FERROUSUL)  325 (65 FE) MG tablet Take 1 tablet (325 mg total) by mouth daily with breakfast.     Gemcitabine HCl (GEMZAR IV) Inject into the vein once a week. Days 1, 8 every 21 days     lidocaine-prilocaine (EMLA) cream Apply a small amount to port a cath site and cover with plastic wrap 1 hour prior to chemotherapy appointments 30 g 3   loratadine (CLARITIN) 10 MG tablet Take 10 mg by mouth daily. Takes for a few days after shot to boost WBC. About every 2nd treatment     megestrol (MEGACE) 400 MG/10ML suspension Take 10 mLs (400 mg total) by mouth 2 (two) times daily. 480 mL 0   tamsulosin (FLOMAX) 0.4 MG CAPS capsule Take 1 capsule (0.4 mg total) by mouth at bedtime. 30 capsule 3   acetaminophen (TYLENOL) 500 MG tablet Take 1,000 mg by mouth every 6 (six) hours as needed for moderate pain or headache. (Patient not taking: Reported on 09/02/2021)     metFORMIN (GLUCOPHAGE) 500 MG tablet Take 1 tablet (500 mg total) by mouth 2 (two) times daily with a meal. 60 tablet 0   methylphenidate (RITALIN) 5 MG tablet Take 1 tablet (5 mg total) by mouth daily as needed. (Patient not taking: Reported on 09/02/2021) 30 tablet 0   oxyCODONE (OXY  IR/ROXICODONE) 5 MG immediate release tablet Take 5 mg by mouth every 4 (four) hours as needed for severe pain. (Patient not taking: Reported on 09/02/2021)     prochlorperazine (COMPAZINE) 10 MG tablet Take 1 tablet (10 mg total) by mouth every 6 (six) hours as needed (Nausea or vomiting). (Patient not taking: Reported on 09/02/2021) 60 tablet 3   No current facility-administered medications for this visit.    ALLERGIES:  No Known Allergies  PHYSICAL EXAM:  Performance status (ECOG): 0 - Asymptomatic  Vitals:   09/02/21 0815  BP: 130/80  Pulse: (!) 102  Resp: 18  Temp: (!) 96.5 F (35.8 C)  SpO2: 98%   Wt Readings from Last 3 Encounters:  09/01/21 159 lb (72.1 kg)  08/20/21 159 lb 3.2 oz (72.2 kg)  08/19/21 156 lb (70.8 kg)   Physical Exam Vitals reviewed.  Constitutional:      Appearance: Normal appearance.  Cardiovascular:     Rate and Rhythm: Normal rate and regular rhythm.     Pulses: Normal pulses.     Heart sounds: Normal heart sounds.  Pulmonary:     Effort: Pulmonary effort is normal.     Breath sounds: Normal breath sounds.  Abdominal:     Palpations: Abdomen is soft. There is no mass.     Tenderness: There is no abdominal tenderness.  Musculoskeletal:     Right lower leg: No edema.     Left lower leg: No edema.  Neurological:     General: No focal deficit present.     Mental Status: He is alert and oriented to person, place, and time.  Psychiatric:        Mood and Affect: Mood normal.        Behavior: Behavior normal.    LABORATORY DATA:  I have reviewed the labs as listed.  CBC Latest Ref Rng & Units 09/02/2021 08/19/2021 08/12/2021  WBC 4.0 - 10.5 K/uL 13.7(H) 10.0 16.0(H)  Hemoglobin 13.0 - 17.0 g/dL 11.1(L) 11.0(L) 10.8(L)  Hematocrit 39.0 - 52.0 % 32.8(L) 32.9(L) 32.3(L)  Platelets 150 - 400 K/uL 290 218 330   CMP Latest Ref Rng & Units 09/02/2021 08/19/2021 08/12/2021  Glucose 70 - 99 mg/dL 203(H) 200(H) 201(H)  BUN 8 - 23 mg/dL 10 16 10    Creatinine 0.61 - 1.24 mg/dL 0.84 0.87 0.78  Sodium 135 - 145 mmol/L 131(L) 131(L) 135  Potassium 3.5 - 5.1 mmol/L 3.8 4.3 4.0  Chloride 98 - 111 mmol/L 101 99 102  CO2 22 - 32 mmol/L 24 24 25   Calcium 8.9 - 10.3 mg/dL 8.8(L) 9.0 8.7(L)  Total Protein 6.5 - 8.1 g/dL 6.7 6.9 6.5  Total Bilirubin 0.3 - 1.2 mg/dL 1.1 1.8(H) 1.3(H)  Alkaline Phos 38 - 126 U/L 623(H) 668(H) 613(H)  AST 15 - 41 U/L 52(H) 68(H) 51(H)  ALT 0 - 44 U/L 58(H) 94(H) 64(H)    DIAGNOSTIC IMAGING:  I have independently reviewed the scans and discussed with the patient. No results found.   ASSESSMENT:  Metastatic cholangiocarcinoma: - 02/2021 initial diagnosis presentation with painless jaundice - ERCP with sphincterotomy, subsequent ERCP with plastic biliary stent placement. - MRI-2 cm mass involving the common hepatic duct with upstream biliary dilatation. - 03/2021 consult at [facility] with GI and surgery-confirm diagnosis of perihilar cholangiocarcinoma - 03/21/2021-biopsy/pathology, ERCP-common hepatic duct cytology and brushings performed.  EUS-1 enlarged lymph node versus tumor implant in the gastrohepatic ligament-negative for malignancy.  Single biliary plastic stent removed and replaced with the plastic stent in the right and left ducts. - 04/17/2021-right portal vein embolization to promote left hepatic hypertrophy. - 07/02/2021-diagnostic laparoscopy with multiple areas concerning for metastatic blocks identified in the abdomen and peritoneal surfaces.  6 separate biopsies obtained. - Pathology consistent with metastatic carcinoma. - 37 pound weight loss in the last 5 months. - Cycle 1 of gemcitabine, cisplatin, durvalumab on 07/22/2021.  2.  Social history/family: - Lives at home with his wife.  He worked as a Development worker, community. - Current active smoker, 1 pack/day since age 65. - Maternal grandmother had cancer.  Maternal uncle had brain tumor.   PLAN:  Metastatic cholangiocarcinoma: -He has  tolerated last cycle very well. - He did have nausea which happens in bouts.  Denied any vomiting.  No diarrhea reported. - Reviewed labs today which showed AST and ALT improved to 52 and 58 respectively.  Total bilirubin is 1.1.  CBC was grossly normal.  Last CA 19-9 has improved to 444 from 700 range. - Proceed with cycle 3 today.  We will use gemcitabine 700 mg per metered squared dose today.  RTC 3 weeks for follow-up.  We will consider scans after cycle 4.  2.  Weight loss: -He lost 5 pounds since last visit.  He reports that he has appetite. - However the smells of foods turn him off and cause nausea. - I have recommended him to take Compazine 10 mg 3 times daily as a standing order.  If it does not help, will consider either Zofran or scopolamine patch.  3.  Cancer fatigue: -He is taking Ritalin 5 mg in the mornings but did not notice any improvement in energy.  I have told him not to take it every day.   4.  Urinary problems: -Continue Flomax 0.4 mg at bedtime.   Orders placed this encounter:  No orders of the defined types were placed in this encounter.    Derek Jack, MD [place] [phone]   I, Thana Ates, am acting as a scribe for Dr. Derek Jack.  I, Derek Jack MD, have reviewed the above documentation for accuracy and completeness, and I agree with the above.

## 2021-09-02 NOTE — Patient Instructions (Addendum)
[facility] at [facility] Discharge Instructions   You were seen and examined today by Dr. Delton Coombes.  He reviewed your labs which are normal/stable.  Start taking Compazine (nausea medication) three times a day, regardless of whether or not you feel nauseated.   Start taking Megace to help stimulate your appetite - you have lost 5 lbs since your last visit.   Return as scheduled for lab work, office visit, and treatment.    Thank you for choosing [place] at [facility] to provide your oncology and hematology care.  To afford each patient quality time with our provider, please arrive at least 15 minutes before your scheduled appointment time.   If you have a lab appointment with the [facility] please come in thru the Main Entrance and check in at the main information desk.  You need to re-schedule your appointment should you arrive 10 or more minutes late.  We strive to give you quality time with our providers, and arriving late affects you and other patients whose appointments are after yours.  Also, if you no show three or more times for appointments you may be dismissed from the clinic at the providers discretion.     Again, thank you for choosing [facility].  Our hope is that these requests will decrease the amount of time that you wait before being seen by our physicians.       _____________________________________________________________  Should you have questions after your visit to [facility], please contact our office at [phone] and follow the prompts.  Our office hours are 8:00 a.m. and 4:30 p.m. Monday - Friday.  Please note that voicemails left after 4:00 p.m. may not be returned until the following business day.  We are closed weekends and major holidays.  You do have access to a nurse 24-7, just call the main number to the clinic [phone] and do not press any options, hold on the line and  a nurse will answer the phone.    For prescription refill requests, have your pharmacy contact our office and allow 72 hours.    Due to Covid, you will need to wear a mask upon entering the hospital. If you do not have a mask, a mask will be given to you at the Main Entrance upon arrival. For doctor visits, patients may have 1 support person age 109 or older with them. For treatment visits, patients can not have anyone with them due to social distancing guidelines and our immunocompromised population.

## 2021-09-03 LAB — CANCER ANTIGEN 19-9: CA 19-9: 426 U/mL — ABNORMAL HIGH (ref 0–35)

## 2021-09-04 ENCOUNTER — Ambulatory Visit (INDEPENDENT_AMBULATORY_CARE_PROVIDER_SITE_OTHER): Payer: Medicare Other | Admitting: Internal Medicine

## 2021-09-04 ENCOUNTER — Ambulatory Visit (INDEPENDENT_AMBULATORY_CARE_PROVIDER_SITE_OTHER): Payer: Commercial Managed Care - PPO | Admitting: Gastroenterology

## 2021-09-08 ENCOUNTER — Emergency Department (HOSPITAL_COMMUNITY): Payer: Medicare Other

## 2021-09-08 ENCOUNTER — Telehealth (HOSPITAL_COMMUNITY): Payer: Self-pay | Admitting: *Deleted

## 2021-09-08 ENCOUNTER — Encounter (HOSPITAL_COMMUNITY): Payer: Self-pay | Admitting: *Deleted

## 2021-09-08 ENCOUNTER — Inpatient Hospital Stay (HOSPITAL_COMMUNITY)
Admission: EM | Admit: 2021-09-08 | Discharge: 2021-09-12 | DRG: 281 | Disposition: A | Discharge status: Home / Self Care | Payer: Medicare Other | Attending: Internal Medicine | Admitting: Internal Medicine

## 2021-09-08 ENCOUNTER — Other Ambulatory Visit: Payer: Self-pay

## 2021-09-08 ENCOUNTER — Encounter (HOSPITAL_COMMUNITY)
Admission: EM | Disposition: A | Discharge status: Home / Self Care | Payer: Self-pay | Source: Home / Self Care | Attending: Internal Medicine

## 2021-09-08 DIAGNOSIS — Z7982 Long term (current) use of aspirin: Secondary | ICD-10-CM

## 2021-09-08 DIAGNOSIS — Z8249 Family history of ischemic heart disease and other diseases of the circulatory system: Secondary | ICD-10-CM

## 2021-09-08 DIAGNOSIS — I214 Non-ST elevation (NSTEMI) myocardial infarction: Principal | ICD-10-CM

## 2021-09-08 DIAGNOSIS — E871 Hypo-osmolality and hyponatremia: Secondary | ICD-10-CM | POA: Diagnosis present

## 2021-09-08 DIAGNOSIS — I70213 Atherosclerosis of native arteries of extremities with intermittent claudication, bilateral legs: Secondary | ICD-10-CM | POA: Diagnosis present

## 2021-09-08 DIAGNOSIS — K5900 Constipation, unspecified: Secondary | ICD-10-CM | POA: Diagnosis not present

## 2021-09-08 DIAGNOSIS — Z720 Tobacco use: Secondary | ICD-10-CM

## 2021-09-08 DIAGNOSIS — I951 Orthostatic hypotension: Secondary | ICD-10-CM | POA: Diagnosis not present

## 2021-09-08 DIAGNOSIS — I249 Acute ischemic heart disease, unspecified: Secondary | ICD-10-CM | POA: Diagnosis present

## 2021-09-08 DIAGNOSIS — E119 Type 2 diabetes mellitus without complications: Secondary | ICD-10-CM

## 2021-09-08 DIAGNOSIS — I2510 Atherosclerotic heart disease of native coronary artery without angina pectoris: Secondary | ICD-10-CM | POA: Diagnosis not present

## 2021-09-08 DIAGNOSIS — C799 Secondary malignant neoplasm of unspecified site: Secondary | ICD-10-CM | POA: Diagnosis present

## 2021-09-08 DIAGNOSIS — I441 Atrioventricular block, second degree: Secondary | ICD-10-CM | POA: Diagnosis present

## 2021-09-08 DIAGNOSIS — G8929 Other chronic pain: Secondary | ICD-10-CM | POA: Diagnosis present

## 2021-09-08 DIAGNOSIS — J449 Chronic obstructive pulmonary disease, unspecified: Secondary | ICD-10-CM | POA: Diagnosis present

## 2021-09-08 DIAGNOSIS — I480 Paroxysmal atrial fibrillation: Secondary | ICD-10-CM | POA: Diagnosis present

## 2021-09-08 DIAGNOSIS — R001 Bradycardia, unspecified: Secondary | ICD-10-CM | POA: Diagnosis not present

## 2021-09-08 DIAGNOSIS — F17210 Nicotine dependence, cigarettes, uncomplicated: Secondary | ICD-10-CM | POA: Diagnosis present

## 2021-09-08 DIAGNOSIS — E1151 Type 2 diabetes mellitus with diabetic peripheral angiopathy without gangrene: Secondary | ICD-10-CM | POA: Diagnosis present

## 2021-09-08 DIAGNOSIS — Z79899 Other long term (current) drug therapy: Secondary | ICD-10-CM

## 2021-09-08 DIAGNOSIS — E1165 Type 2 diabetes mellitus with hyperglycemia: Secondary | ICD-10-CM | POA: Diagnosis present

## 2021-09-08 DIAGNOSIS — E118 Type 2 diabetes mellitus with unspecified complications: Secondary | ICD-10-CM

## 2021-09-08 DIAGNOSIS — E785 Hyperlipidemia, unspecified: Secondary | ICD-10-CM | POA: Diagnosis present

## 2021-09-08 DIAGNOSIS — Z6831 Body mass index (BMI) 31.0-31.9, adult: Secondary | ICD-10-CM

## 2021-09-08 DIAGNOSIS — C221 Intrahepatic bile duct carcinoma: Secondary | ICD-10-CM | POA: Diagnosis present

## 2021-09-08 DIAGNOSIS — K831 Obstruction of bile duct: Secondary | ICD-10-CM | POA: Diagnosis present

## 2021-09-08 DIAGNOSIS — D649 Anemia, unspecified: Secondary | ICD-10-CM | POA: Diagnosis present

## 2021-09-08 DIAGNOSIS — I110 Hypertensive heart disease with heart failure: Secondary | ICD-10-CM | POA: Diagnosis present

## 2021-09-08 DIAGNOSIS — I5022 Chronic systolic (congestive) heart failure: Secondary | ICD-10-CM | POA: Diagnosis present

## 2021-09-08 DIAGNOSIS — E669 Obesity, unspecified: Secondary | ICD-10-CM | POA: Diagnosis present

## 2021-09-08 DIAGNOSIS — M5030 Other cervical disc degeneration, unspecified cervical region: Secondary | ICD-10-CM | POA: Diagnosis present

## 2021-09-08 DIAGNOSIS — Z95828 Presence of other vascular implants and grafts: Secondary | ICD-10-CM

## 2021-09-08 DIAGNOSIS — F17200 Nicotine dependence, unspecified, uncomplicated: Secondary | ICD-10-CM | POA: Diagnosis not present

## 2021-09-08 DIAGNOSIS — I4892 Unspecified atrial flutter: Secondary | ICD-10-CM | POA: Diagnosis present

## 2021-09-08 DIAGNOSIS — Z7984 Long term (current) use of oral hypoglycemic drugs: Secondary | ICD-10-CM

## 2021-09-08 DIAGNOSIS — I10 Essential (primary) hypertension: Secondary | ICD-10-CM | POA: Diagnosis present

## 2021-09-08 DIAGNOSIS — Z20822 Contact with and (suspected) exposure to covid-19: Secondary | ICD-10-CM | POA: Diagnosis present

## 2021-09-08 DIAGNOSIS — C249 Malignant neoplasm of biliary tract, unspecified: Secondary | ICD-10-CM | POA: Diagnosis present

## 2021-09-08 HISTORY — PX: LEFT HEART CATH AND CORONARY ANGIOGRAPHY: CATH118249

## 2021-09-08 HISTORY — DX: Malignant (primary) neoplasm, unspecified: C80.1

## 2021-09-08 LAB — COMPREHENSIVE METABOLIC PANEL
ALT: 83 U/L — ABNORMAL HIGH (ref 0–44)
AST: 101 U/L — ABNORMAL HIGH (ref 15–41)
Albumin: 2.9 g/dL — ABNORMAL LOW (ref 3.5–5.0)
Alkaline Phosphatase: 381 U/L — ABNORMAL HIGH (ref 38–126)
Anion gap: 6 (ref 5–15)
BUN: 18 mg/dL (ref 8–23)
CO2: 23 mmol/L (ref 22–32)
Calcium: 8.6 mg/dL — ABNORMAL LOW (ref 8.9–10.3)
Chloride: 102 mmol/L (ref 98–111)
Creatinine, Ser: 0.85 mg/dL (ref 0.61–1.24)
GFR, Estimated: >60 mL/min (ref >60)
Glucose, Bld: 130 mg/dL — ABNORMAL HIGH (ref 70–99)
Potassium: 4.6 mmol/L (ref 3.5–5.1)
Sodium: 131 mmol/L — ABNORMAL LOW (ref 135–145)
Total Bilirubin: 2.2 mg/dL — ABNORMAL HIGH (ref 0.3–1.2)
Total Protein: 7 g/dL (ref 6.5–8.1)

## 2021-09-08 LAB — CBC WITH DIFFERENTIAL/PLATELET
Abs Immature Granulocytes: 0.04 10*3/uL (ref 0.00–0.07)
Basophils Absolute: 0 10*3/uL (ref 0.0–0.1)
Basophils Relative: 0 %
Eosinophils Absolute: 0 10*3/uL (ref 0.0–0.5)
Eosinophils Relative: 0 %
HCT: 29.3 % — ABNORMAL LOW (ref 39.0–52.0)
Hemoglobin: 10 g/dL — ABNORMAL LOW (ref 13.0–17.0)
Immature Granulocytes: 0 %
Lymphocytes Relative: 7 %
Lymphs Abs: 0.9 10*3/uL (ref 0.7–4.0)
MCH: 33.9 pg (ref 26.0–34.0)
MCHC: 34.1 g/dL (ref 30.0–36.0)
MCV: 99.3 fL (ref 80.0–100.0)
Monocytes Absolute: 1.4 10*3/uL — ABNORMAL HIGH (ref 0.1–1.0)
Monocytes Relative: 12 %
Neutro Abs: 10 10*3/uL — ABNORMAL HIGH (ref 1.7–7.7)
Neutrophils Relative %: 81 %
Platelets: 182 10*3/uL (ref 150–400)
RBC: 2.95 MIL/uL — ABNORMAL LOW (ref 4.22–5.81)
RDW: 16.7 % — ABNORMAL HIGH (ref 11.5–15.5)
WBC: 12.5 10*3/uL — ABNORMAL HIGH (ref 4.0–10.5)
nRBC: 0 % (ref 0.0–0.2)

## 2021-09-08 LAB — GLUCOSE, CAPILLARY
Glucose-Capillary: 107 mg/dL — ABNORMAL HIGH (ref 70–99)
Glucose-Capillary: 191 mg/dL — ABNORMAL HIGH (ref 70–99)

## 2021-09-08 LAB — RESP PANEL BY RT-PCR (FLU A&B, COVID) ARPGX2
Influenza A by PCR: NEGATIVE
Influenza B by PCR: NEGATIVE
SARS Coronavirus 2 by RT PCR: NEGATIVE

## 2021-09-08 LAB — D-DIMER, QUANTITATIVE: D-Dimer, Quant: 1.77 ug/mL-FEU — ABNORMAL HIGH (ref 0.00–0.50)

## 2021-09-08 LAB — HEMOGLOBIN A1C
Hgb A1c MFr Bld: 5.8 % — ABNORMAL HIGH (ref 4.8–5.6)
Mean Plasma Glucose: 119.76 mg/dL

## 2021-09-08 LAB — TROPONIN I (HIGH SENSITIVITY)
Troponin I (High Sensitivity): 17119 ng/L (ref <18)
Troponin I (High Sensitivity): 14964 ng/L (ref <18)

## 2021-09-08 LAB — LIPASE, BLOOD: Lipase: 32 U/L (ref 11–51)

## 2021-09-08 SURGERY — LEFT HEART CATH AND CORONARY ANGIOGRAPHY
Anesthesia: LOCAL

## 2021-09-08 MED ORDER — ONDANSETRON HCL 4 MG/2ML IJ SOLN
4.0000 mg | Freq: Four times a day (QID) | INTRAMUSCULAR | Status: DC | PRN
  Administered 2021-09-10: 4 mg via INTRAVENOUS
  Filled 2021-09-08: qty 2

## 2021-09-08 MED ORDER — SODIUM CHLORIDE 0.9 % IV SOLN: 250.0000 mL | INTRAVENOUS | Status: DC | PRN

## 2021-09-08 MED ORDER — IOHEXOL 350 MG/ML SOLN
INTRAVENOUS | Status: DC | PRN
  Administered 2021-09-08: 70 mL via INTRA_ARTERIAL

## 2021-09-08 MED ORDER — MORPHINE SULFATE (PF) 2 MG/ML IV SOLN
INTRAVENOUS | Status: AC
  Filled 2021-09-08: qty 1

## 2021-09-08 MED ORDER — ADULT MULTIVITAMIN W/MINERALS CH
1.0000 | ORAL_TABLET | Freq: Every day | ORAL | Status: DC
  Administered 2021-09-08 – 2021-09-12 (×5): 1 via ORAL
  Filled 2021-09-08 (×5): qty 1

## 2021-09-08 MED ORDER — BISACODYL 10 MG RE SUPP: 10.0000 mg | Freq: Every day | RECTAL | Status: DC | PRN

## 2021-09-08 MED ORDER — INSULIN ASPART 100 UNIT/ML IJ SOLN
0.0000 [IU] | Freq: Three times a day (TID) | INTRAMUSCULAR | Status: DC
  Administered 2021-09-09 – 2021-09-11 (×4): 1 [IU] via SUBCUTANEOUS

## 2021-09-08 MED ORDER — PANTOPRAZOLE SODIUM 40 MG PO TBEC
40.0000 mg | DELAYED_RELEASE_TABLET | Freq: Every day | ORAL | Status: DC
  Administered 2021-09-08 – 2021-09-12 (×5): 40 mg via ORAL
  Filled 2021-09-08 (×5): qty 1

## 2021-09-08 MED ORDER — ATORVASTATIN CALCIUM 40 MG PO TABS
40.0000 mg | ORAL_TABLET | Freq: Every day | ORAL | Status: DC
  Administered 2021-09-08 – 2021-09-11 (×4): 40 mg via ORAL
  Filled 2021-09-08 (×4): qty 1

## 2021-09-08 MED ORDER — ACETAMINOPHEN 325 MG PO TABS
650.0000 mg | ORAL_TABLET | Freq: Four times a day (QID) | ORAL | Status: DC | PRN
  Administered 2021-09-10 – 2021-09-11 (×3): 650 mg via ORAL
  Filled 2021-09-08 (×3): qty 2

## 2021-09-08 MED ORDER — ASPIRIN 81 MG PO CHEW
243.0000 mg | CHEWABLE_TABLET | Freq: Once | ORAL | Status: AC
  Administered 2021-09-08: 243 mg via ORAL
  Filled 2021-09-08: qty 3

## 2021-09-08 MED ORDER — CLOPIDOGREL BISULFATE 300 MG PO TABS
ORAL_TABLET | ORAL | Status: DC | PRN
  Administered 2021-09-08: 600 mg via ORAL

## 2021-09-08 MED ORDER — NITROGLYCERIN IN D5W 200-5 MCG/ML-% IV SOLN
5.0000 ug/min | INTRAVENOUS | Status: DC
  Administered 2021-09-08: 10 ug/min via INTRAVENOUS
  Administered 2021-09-08: 5 ug/min via INTRAVENOUS
  Filled 2021-09-08 (×2): qty 250

## 2021-09-08 MED ORDER — HEPARIN (PORCINE) 25000 UT/250ML-% IV SOLN
850.0000 [IU]/h | INTRAVENOUS | Status: DC
  Administered 2021-09-08: 850 [IU]/h via INTRAVENOUS
  Filled 2021-09-08: qty 250

## 2021-09-08 MED ORDER — ONDANSETRON HCL 4 MG PO TABS
4.0000 mg | ORAL_TABLET | Freq: Four times a day (QID) | ORAL | Status: DC | PRN
  Administered 2021-09-11: 4 mg via ORAL
  Filled 2021-09-08: qty 1

## 2021-09-08 MED ORDER — ACETAMINOPHEN 650 MG RE SUPP: 650.0000 mg | Freq: Four times a day (QID) | RECTAL | Status: DC | PRN

## 2021-09-08 MED ORDER — TAMSULOSIN HCL 0.4 MG PO CAPS
0.4000 mg | ORAL_CAPSULE | Freq: Every day | ORAL | Status: DC
  Administered 2021-09-08 – 2021-09-10 (×3): 0.4 mg via ORAL
  Filled 2021-09-08 (×3): qty 1

## 2021-09-08 MED ORDER — SODIUM CHLORIDE 0.9% FLUSH
3.0000 mL | INTRAVENOUS | Status: DC | PRN
Start: 2021-09-08 — End: 2021-09-12

## 2021-09-08 MED ORDER — FENTANYL CITRATE (PF) 100 MCG/2ML IJ SOLN
INTRAMUSCULAR | Status: AC
  Filled 2021-09-08: qty 2

## 2021-09-08 MED ORDER — ASPIRIN 81 MG PO CHEW
81.0000 mg | CHEWABLE_TABLET | ORAL | Status: AC
  Filled 2021-09-08: qty 1

## 2021-09-08 MED ORDER — INSULIN ASPART 100 UNIT/ML IJ SOLN: 0.0000 [IU] | Freq: Every day | INTRAMUSCULAR | Status: DC

## 2021-09-08 MED ORDER — SODIUM CHLORIDE 0.9% FLUSH: 3.0000 mL | INTRAVENOUS | Status: DC | PRN

## 2021-09-08 MED ORDER — SODIUM CHLORIDE 0.9 % WEIGHT BASED INFUSION: 3.0000 mL/kg/h | INTRAVENOUS | Status: AC

## 2021-09-08 MED ORDER — HEPARIN (PORCINE) IN NACL 1000-0.9 UT/500ML-% IV SOLN
INTRAVENOUS | Status: AC
  Filled 2021-09-08: qty 1000

## 2021-09-08 MED ORDER — HYDRALAZINE HCL 20 MG/ML IJ SOLN: 10.0000 mg | INTRAMUSCULAR | Status: AC | PRN

## 2021-09-08 MED ORDER — CLOPIDOGREL BISULFATE 75 MG PO TABS
75.0000 mg | ORAL_TABLET | Freq: Every day | ORAL | Status: DC
  Administered 2021-09-09 – 2021-09-12 (×4): 75 mg via ORAL
  Filled 2021-09-08 (×4): qty 1

## 2021-09-08 MED ORDER — ASPIRIN 81 MG PO CHEW
81.0000 mg | CHEWABLE_TABLET | Freq: Every day | ORAL | Status: DC
  Administered 2021-09-10 – 2021-09-12 (×3): 81 mg via ORAL
  Filled 2021-09-08 (×3): qty 1

## 2021-09-08 MED ORDER — FENTANYL CITRATE (PF) 100 MCG/2ML IJ SOLN
INTRAMUSCULAR | Status: DC | PRN
  Administered 2021-09-08: 25 ug via INTRAVENOUS

## 2021-09-08 MED ORDER — HEPARIN BOLUS VIA INFUSION
4000.0000 [IU] | Freq: Once | INTRAVENOUS | Status: AC
  Administered 2021-09-08: 4000 [IU] via INTRAVENOUS

## 2021-09-08 MED ORDER — MORPHINE SULFATE (PF) 4 MG/ML IV SOLN
4.0000 mg | Freq: Once | INTRAVENOUS | Status: AC
  Administered 2021-09-08: 4 mg via INTRAVENOUS
  Filled 2021-09-08: qty 1

## 2021-09-08 MED ORDER — HEPARIN SODIUM (PORCINE) 1000 UNIT/ML IJ SOLN
INTRAMUSCULAR | Status: AC
  Filled 2021-09-08: qty 10

## 2021-09-08 MED ORDER — SODIUM CHLORIDE 0.9% FLUSH
3.0000 mL | Freq: Two times a day (BID) | INTRAVENOUS | Status: DC
  Administered 2021-09-08 – 2021-09-10 (×5): 3 mL via INTRAVENOUS

## 2021-09-08 MED ORDER — ONDANSETRON HCL 4 MG/2ML IJ SOLN: 4.0000 mg | Freq: Four times a day (QID) | INTRAMUSCULAR | Status: DC | PRN

## 2021-09-08 MED ORDER — SODIUM CHLORIDE 0.9% FLUSH
3.0000 mL | Freq: Two times a day (BID) | INTRAVENOUS | Status: DC
  Administered 2021-09-09: 3 mL via INTRAVENOUS

## 2021-09-08 MED ORDER — POLYETHYLENE GLYCOL 3350 17 G PO PACK: 17.0000 g | PACK | Freq: Every day | ORAL | Status: DC | PRN

## 2021-09-08 MED ORDER — LIDOCAINE HCL (PF) 1 % IJ SOLN
INTRAMUSCULAR | Status: DC | PRN
  Administered 2021-09-08: 2 mL

## 2021-09-08 MED ORDER — VERAPAMIL HCL 2.5 MG/ML IV SOLN
INTRAVENOUS | Status: AC
  Filled 2021-09-08: qty 2

## 2021-09-08 MED ORDER — ACETAMINOPHEN 325 MG PO TABS: 650.0000 mg | ORAL_TABLET | ORAL | Status: DC | PRN

## 2021-09-08 MED ORDER — SODIUM CHLORIDE 0.9 % IV SOLN
INTRAVENOUS | Status: AC | PRN
  Administered 2021-09-08: 50 mL/h via INTRAVENOUS
  Administered 2021-09-08: 250 mL via INTRAVENOUS

## 2021-09-08 MED ORDER — VERAPAMIL HCL 2.5 MG/ML IV SOLN: INTRAVENOUS | Status: DC | PRN

## 2021-09-08 MED ORDER — IOHEXOL 350 MG/ML SOLN
100.0000 mL | Freq: Once | INTRAVENOUS | Status: AC | PRN
  Administered 2021-09-08: 80 mL via INTRAVENOUS

## 2021-09-08 MED ORDER — TRAZODONE HCL 50 MG PO TABS: 50.0000 mg | ORAL_TABLET | Freq: Every evening | ORAL | Status: DC | PRN

## 2021-09-08 MED ORDER — HEPARIN (PORCINE) IN NACL 1000-0.9 UT/500ML-% IV SOLN
INTRAVENOUS | Status: DC | PRN
  Administered 2021-09-08 (×2): 500 mL

## 2021-09-08 MED ORDER — HYDRALAZINE HCL 20 MG/ML IJ SOLN: 10.0000 mg | Freq: Four times a day (QID) | INTRAMUSCULAR | Status: DC | PRN

## 2021-09-08 MED ORDER — HEPARIN SODIUM (PORCINE) 1000 UNIT/ML IJ SOLN
INTRAMUSCULAR | Status: DC | PRN
  Administered 2021-09-08: 5000 [IU] via INTRAVENOUS

## 2021-09-08 MED ORDER — FERROUS SULFATE 325 (65 FE) MG PO TABS
325.0000 mg | ORAL_TABLET | Freq: Every day | ORAL | Status: DC
  Administered 2021-09-09 – 2021-09-12 (×4): 325 mg via ORAL
  Filled 2021-09-08 (×4): qty 1

## 2021-09-08 MED ORDER — SODIUM CHLORIDE 0.9 % IV SOLN: INTRAVENOUS | Status: AC

## 2021-09-08 MED ORDER — LIDOCAINE HCL (PF) 1 % IJ SOLN
INTRAMUSCULAR | Status: AC
  Filled 2021-09-08: qty 30

## 2021-09-08 MED ORDER — SODIUM CHLORIDE 0.9% FLUSH
3.0000 mL | Freq: Two times a day (BID) | INTRAVENOUS | Status: DC
  Administered 2021-09-09 – 2021-09-12 (×2): 3 mL via INTRAVENOUS

## 2021-09-08 MED ORDER — SODIUM CHLORIDE 0.9% FLUSH
3.0000 mL | Freq: Two times a day (BID) | INTRAVENOUS | Status: DC
  Administered 2021-09-08 – 2021-09-12 (×4): 3 mL via INTRAVENOUS

## 2021-09-08 MED ORDER — ALUM & MAG HYDROXIDE-SIMETH 200-200-20 MG/5ML PO SUSP
30.0000 mL | Freq: Once | ORAL | Status: AC
  Administered 2021-09-08: 30 mL via ORAL
  Filled 2021-09-08: qty 30

## 2021-09-08 MED ORDER — CLOPIDOGREL BISULFATE 300 MG PO TABS
ORAL_TABLET | ORAL | Status: AC
  Filled 2021-09-08: qty 2

## 2021-09-08 MED ORDER — SODIUM CHLORIDE 0.9 % WEIGHT BASED INFUSION
1.0000 mL/kg/h | INTRAVENOUS | Status: DC
  Administered 2021-09-09: 1 mL/kg/h via INTRAVENOUS

## 2021-09-08 MED ORDER — MORPHINE SULFATE (PF) 2 MG/ML IV SOLN
INTRAVENOUS | Status: DC | PRN
  Administered 2021-09-08: 1 mg via INTRAVENOUS

## 2021-09-08 MED ORDER — LABETALOL HCL 5 MG/ML IV SOLN: 10.0000 mg | INTRAVENOUS | Status: AC | PRN

## 2021-09-08 SURGICAL SUPPLY — 12 items
CATH INFINITI 6F ANG MULTIPACK (CATHETERS) ×1 IMPLANT
CATH INFINITI 6F FL3.5 (CATHETERS) ×1 IMPLANT
DEVICE RAD TR BAND REGULAR (VASCULAR PRODUCTS) ×1 IMPLANT
ELECT DEFIB PAD ADLT CADENCE (PAD) ×1 IMPLANT
GLIDESHEATH SLEND SS 6F .021 (SHEATH) ×1 IMPLANT
GUIDEWIRE INQWIRE 1.5J.035X260 (WIRE) IMPLANT
INQWIRE 1.5J .035X260CM (WIRE) ×2
KIT HEART LEFT (KITS) ×2 IMPLANT
PACK CARDIAC CATHETERIZATION (CUSTOM PROCEDURE TRAY) ×2 IMPLANT
SHEATH PROBE COVER 6X72 (BAG) ×1 IMPLANT
TRANSDUCER W/STOPCOCK (MISCELLANEOUS) ×2 IMPLANT
TUBING CIL FLEX 10 FLL-RA (TUBING) ×2 IMPLANT

## 2021-09-08 NOTE — ED Notes (Signed)
Wife taking all pt belongings. [facility] at bedside.

## 2021-09-08 NOTE — Telephone Encounter (Signed)
Patient's wife called to advise that patient has been complaining of 7/10 mid sternal chest pain since yesterday.  States it seems to be worse with inspiration, at times.  Denies radiation to arms or jaw.  No nausea or fever.  Received chemo on 1/24 last week and has had no symptoms until yesterday.  States lightheadedness upon standing.  Advised to seek medical attention in the ER.  Verbalized understanding and will do so.

## 2021-09-08 NOTE — H&P (View-Only) (Signed)
Cardiology Consultation:   Patient ID: Jonathan Keith MRN: [id number]; DOB: 20-Aug-1955  Admit date: 09/08/2021 Date of Consult: 09/08/2021  PCP:  [name], PA-C   [facility] Providers Cardiologist:  None   {   Patient Profile:   Jonathan Keith is a 66 y.o. male with a hx of intrahepatic metastatic cholangiocarcinoma status post biliary stenting and right portal vein embolization, HLD, tobacco abuse, HTN, PAD, and DMII who is being seen 09/08/2021 for the evaluation of NSTEMI at the request of Dr. Denton Brick.  History of Present Illness:   Mr. Telfair 66 year old male with history as detailed above with no known cardiac history who presented to [facility] with substernal chest pressure that began on the evening of admission found to have troponin [place] [address] for which Cardiology has been consulted.  The patient states that he was doing overall okay until yesterday evening when he developed substernal chest pressure that awoke him from sleep. He tried to just rest and see if it would go away, however, his symptoms persisted prompting him to go to the [facility]. Prior to the onset of this episode, the patient was feeling progressively fatigued and weak which he attributed to his chemotherapy, but no exertional chest pain or prior episodes of chest pain. He does have chronic dyspnea on exertion from underlying COPD which was not worsened from baseline.  In the ER, trop 17,000. ECG with very subtle STE in inferior leads. Initially STEMI doc was paged, but was deemed not to be a STEMI. CTA negative for PE but with coronary calcification. He was placed on heparin gtt and nitro gtt.   On my interview, the patient was continuing to have 6/10 chest pain. Given symptoms and troponin, the decision was made to transfer to [facility] for urgent coronary angiography.   Past Medical History:  Diagnosis Date   Cancer ([place])    Chronic pain    neck, back, knees   Class 1 obesity due to excess calories with body mass  index (BMI) of 31.0 to 31.9 in adult    Claudication [facility])    DDD (degenerative disc disease), cervical    Diabetes mellitus ([place])    Dyslipidemia    Hypertension    Port-A-Cath in place 07/20/2021   Tobacco dependence     Past Surgical History:  Procedure Laterality Date   BILIARY BRUSHING N/A 02/25/2021   Procedure: BILIARY BRUSHING;  Surgeon: Rogene Houston, MD;  Location: [facility];  Service: Gastroenterology;  Laterality: N/A;   BILIARY STENT PLACEMENT N/A 02/25/2021   Procedure: BILIARY STENT PLACEMENT;  Surgeon: Rogene Houston, MD;  Location: [facility];  Service: Gastroenterology;  Laterality: N/A;   BILIARY STENT PLACEMENT  02/27/2021   Procedure: BILIARY STENT PLACEMENT (10FR x 9cm) IN THE RIGHT SYSTEM;  Surgeon: Rogene Houston, MD;  Location: [facility];  Service: Gastroenterology;;   BREAST SURGERY Left    benign lump- in his 65s.   ERCP N/A 02/25/2021   Procedure: ENDOSCOPIC RETROGRADE CHOLANGIOPANCREATOGRAPHY (ERCP);  Surgeon: Rogene Houston, MD;  Location: [facility];  Service: Gastroenterology;  Laterality: N/A;   ERCP N/A 02/27/2021   Procedure: ENDOSCOPIC RETROGRADE CHOLANGIOPANCREATOGRAPHY (ERCP);  Surgeon: Rogene Houston, MD;  Location: [facility];  Service: Gastroenterology;  Laterality: N/A;   IR IMAGING GUIDED PORT INSERTION  07/21/2021   KNEE SURGERY Left    x2   SPHINCTEROTOMY N/A 02/25/2021   Procedure: SPHINCTEROTOMY;  Surgeon: Rogene Houston, MD;  Location: [facility];  Service: Gastroenterology;  Laterality: N/A;   STENT REMOVAL  02/27/2021   Procedure: STENT REMOVAL (8.5Fr x 9cm);  Surgeon: Rogene Houston, MD;  Location: [facility];  Service: Gastroenterology;;     Home Medications:  Prior to Admission medications   Medication Sig Start Date End Date Taking? Authorizing Provider  acetaminophen (TYLENOL) 500 MG tablet Take 1,000 mg by mouth every 6 (six) hours as needed for moderate pain or headache.   Yes [provider]  aspirin EC 81 MG tablet Take 81 mg by  mouth as needed (chest pain). Swallow whole.   Yes [provider]  CISPLATIN IV Inject into the vein once a week. Days 1, 8 every 21 days 07/22/21  Yes [provider]  Dextromethorphan-guaiFENesin (ROBITUSSIN DM PO) Take 1 Dose by mouth at bedtime as needed (allergy flare up). 2 tsp at night as needed for allergy flare ups   Yes [provider]  Durvalumab (IMFINZI IV) Inject into the vein every 21 ( twenty-one) days. 07/22/21  Yes [provider]  famotidine (PEPCID) 20 MG tablet Take 20 mg by mouth daily.   Yes [provider]  ferrous sulfate (FERROUSUL) 325 (65 FE) MG tablet Take 1 tablet (325 mg total) by mouth daily with breakfast. 03/18/21  Yes Rehman, Mechele Dawley, MD  Gemcitabine HCl (GEMZAR IV) Inject into the vein once a week. Days 1, 8 every 21 days 07/22/21  Yes [provider]  lidocaine-prilocaine (EMLA) cream Apply a small amount to port a cath site and cover with plastic wrap 1 hour prior to chemotherapy appointments 07/21/21  Yes Derek Jack, MD  loratadine (CLARITIN) 10 MG tablet Take 10 mg by mouth daily. Takes for a few days after shot to boost WBC. About every 2nd treatment   Yes [provider]  metFORMIN (GLUCOPHAGE) 500 MG tablet Take 1 tablet (500 mg total) by mouth 2 (two) times daily with a meal. 05/29/21 09/08/21 Yes Dessa Phi, DO  prochlorperazine (COMPAZINE) 10 MG tablet Take 1 tablet (10 mg total) by mouth every 6 (six) hours as needed (Nausea or vomiting). Patient taking differently: Take 10 mg by mouth every 8 (eight) hours as needed for nausea or vomiting (Nausea or vomiting). 07/21/21  Yes Derek Jack, MD  tamsulosin (FLOMAX) 0.4 MG CAPS capsule Take 1 capsule (0.4 mg total) by mouth at bedtime. 08/12/21  Yes Derek Jack, MD  megestrol (MEGACE) 400 MG/10ML suspension Take 10 mLs (400 mg total) by mouth 2 (two) times daily. Patient not taking: Reported on 09/08/2021 07/17/21    Derek Jack, MD  methylphenidate (RITALIN) 5 MG tablet Take 1 tablet (5 mg total) by mouth daily as needed. Patient not taking: Reported on 09/02/2021 07/29/21   Derek Jack, MD    Inpatient Medications: Scheduled Meds:  [MAR Hold] atorvastatin  40 mg Oral q1800   [MAR Hold] insulin aspart  0-5 Units Subcutaneous QHS   [MAR Hold] insulin aspart  0-6 Units Subcutaneous TID WC   Continuous Infusions:  heparin 850 Units/hr (09/08/21 1320)   nitroGLYCERIN 10 mcg/min (09/08/21 1432)   PRN Meds:   Allergies:   No Known Allergies  Social History:   Social History   Socioeconomic History   Marital status: Married    Spouse name: Not on file   Number of children: Not on file   Years of education: Not on file   Highest education level: Not on file  Occupational History   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  Tobacco Use  Smoking status: Every Day    Packs/day: 0.50    Years: 56.00    Pack years: 28.00    Types: Cigarettes   Smokeless tobacco: Never  Vaping Use   Vaping Use: Never used  Substance and Sexual Activity   Alcohol use: Not Currently    Comment: stopped 2.5 years ago 03/11/21   Drug use: Never   Sexual activity: Not on file  Other Topics Concern   Not on file  Social History Narrative   Not on file   Social Determinants of Health   Financial Resource Strain: Medium Risk   Difficulty of Paying Living Expenses: Somewhat hard  Food Insecurity: No Food Insecurity   Worried About Charity fundraiser in the Last Year: Never true   Ran Out of Food in the Last Year: Never true  Transportation Needs: No Transportation Needs   Lack of Transportation (Medical): No   Lack of Transportation (Non-Medical): No  Physical Activity: Insufficiently Active   Days of Exercise per Week: 2 days   Minutes of Exercise per Session: 20 min  Stress: No Stress Concern Present   Feeling of Stress : Only a little  Social Connections: Moderately Integrated    Frequency of Communication with Friends and Family: More than three times a week   Frequency of Social Gatherings with Friends and Family: More than three times a week   Attends Religious Services: More than 4 times per year   Active Member of [employer] or Organizations: No   Attends Music therapist: Never   Marital Status: Married  Human resources officer Violence: Not At Risk   Fear of Current or Ex-Partner: No   Emotionally Abused: No   Physically Abused: No   Sexually Abused: No    Family History:    Family History  Problem Relation Age of Onset   CAD Mother    Cancer Neg Hx      ROS:  Please see the history of present illness.  Review of Systems  Constitutional:  Positive for malaise/fatigue and weight loss.  Respiratory:  Positive for shortness of breath.   Cardiovascular:  Positive for chest pain. Negative for palpitations, orthopnea, claudication, leg swelling and PND.  Gastrointestinal:  Negative for blood in stool and melena.  Genitourinary:  Negative for hematuria.  Musculoskeletal:  Positive for joint pain.  Neurological:  Negative for dizziness and loss of consciousness.    Physical Exam/Data:   Vitals:   09/08/21 1300 09/08/21 1315 09/08/21 1432 09/08/21 1529  BP: 108/60 109/67 (!) 91/59   Pulse: 95 88 85   Resp: 19 17 17    Temp:   99.6 F (37.6 C)   TempSrc:   Oral   SpO2: 97% 100% 100% 97%  Weight:      Height:       No intake or output data in the 24 hours ending 09/08/21 1529 Last 3 Weights 09/08/2021 09/02/2021 09/01/2021  Weight (lbs) 154 lb 154 lb 12.8 oz 159 lb  Weight (kg) 69.854 kg 70.217 kg 72.122 kg     Body mass index is 21.48 kg/m.  General:  Mildly uncomfortable appearing HEENT: normal Neck: no JVD Vascular: No carotid bruits; Distal pulses 2+ bilaterally Cardiac:  RR, no murmurs Lungs:  Diminished throughout. Faint expiratory wheezing Abd: soft, nontender, no hepatomegaly  Ext: no edema Musculoskeletal:  No deformities, BUE and  BLE strength normal and equal Skin: warm and dry  Neuro:  CNs 2-12 intact, no focal abnormalities noted Psych:  Normal affect   EKG:  The EKG was personally reviewed and demonstrates:  NSR, <4mm STE in inferior leads Telemetry:  Telemetry was personally reviewed and demonstrates:  NSR  Relevant CV Studies: TTE 09/08/21: IMPRESSIONS     1. Left ventricular ejection fraction, by estimation, is 55 to 60%. The  left ventricle has normal function. The left ventricle has no regional  wall motion abnormalities. Left ventricular diastolic parameters were  normal.   2. Right ventricular systolic function is normal. The right ventricular  size is normal. Tricuspid regurgitation signal is inadequate for assessing  PA pressure.   3. The mitral valve is grossly normal. Trivial mitral valve  regurgitation.   4. The aortic valve is tricuspid. There is mild calcification of the  aortic valve. Aortic valve regurgitation is not visualized.   5. The inferior vena cava is normal in size with greater than 50%  respiratory variability, suggesting right atrial pressure of 3 mmHg.   6. No obvious valvular vegetations.   Comparison(s): No prior Echocardiogram.  Laboratory Data:  High Sensitivity Troponin:   Recent Labs  Lab 09/08/21 1219 09/08/21 1419  TROPONINIHS 17,119* 14,964*     Chemistry Recent Labs  Lab 09/02/21 0805 09/08/21 1219  NA 131* 131*  K 3.8 4.6  CL 101 102  CO2 24 23  GLUCOSE 203* 130*  BUN 10 18  CREATININE 0.84 0.85  CALCIUM 8.8* 8.6*  MG 1.8  --   GFRNONAA >60 >60  ANIONGAP 6 6    Recent Labs  Lab 09/02/21 0805 09/08/21 1219  PROT 6.7 7.0  ALBUMIN 3.1* 2.9*  AST 52* 101*  ALT 58* 83*  ALKPHOS 623* 381*  BILITOT 1.1 2.2*   Lipids No results for input(s): CHOL, TRIG, HDL, LABVLDL, LDLCALC, CHOLHDL in the last 168 hours.  Hematology Recent Labs  Lab 09/02/21 0805 09/08/21 1219  WBC 13.7* 12.5*  RBC 3.40* 2.95*  HGB 11.1* 10.0*  HCT 32.8* 29.3*  MCV  96.5 99.3  MCH 32.6 33.9  MCHC 33.8 34.1  RDW 17.0* 16.7*  PLT 290 182   Thyroid No results for input(s): TSH, FREET4 in the last 168 hours.  BNPNo results for input(s): BNP, PROBNP in the last 168 hours.  DDimer  Recent Labs  Lab 09/08/21 1219  DDIMER 1.77*     Radiology/Studies:  DG Chest 2 View  Result Date: 09/08/2021 CLINICAL DATA:  Provided history: Chest pain. Additional history provided: Patient reports history of bile duct cancer currently on chemotherapy. EXAM: CHEST - 2 VIEW COMPARISON:  Prior chest radiographs 05/25/2021 and earlier. Chest CT 03/12/2021. FINDINGS: A right chest infusion port catheter is present with tip projecting at the level of the lower SVC. Heart size within normal limits. No appreciable airspace consolidation or pulmonary edema. No evidence of pleural effusion or pneumothorax. No acute bony abnormality identified. Degenerative changes of the spine. Redemonstrated metallic coils within the right upper abdomen. IMPRESSION: No evidence of acute cardiopulmonary abnormality. Electronically Signed   By: Kellie Simmering D.O.   On: 09/08/2021 12:21   CT Angio Chest PE W and/or Wo Contrast  Result Date: 09/08/2021 CLINICAL DATA:  Chest pain last night with elevated D-dimer levels. Concern for pulmonary embolism. History of metastatic [place] carcinoma, hypertension and diabetes. EXAM: CT ANGIOGRAPHY CHEST WITH CONTRAST TECHNIQUE: Multidetector CT imaging of the chest was performed using the standard protocol during bolus administration of intravenous contrast. Multiplanar CT image reconstructions and MIPs were obtained to evaluate the vascular anatomy. RADIATION DOSE REDUCTION:  This exam was performed according to the departmental dose-optimization program which includes automated exposure control, adjustment of the mA and/or kV according to patient size and/or use of iterative reconstruction technique. CONTRAST:  65mL OMNIPAQUE IOHEXOL 350 MG/ML SOLN COMPARISON:  Chest CT  03/12/2021. Abdominal CT 05/25/2021. FINDINGS: Cardiovascular: The pulmonary arteries are well opacified with contrast to the level of the subsegmental branches. There is no evidence of acute pulmonary embolism. Right IJ Port-A-Cath extends to the upper right atrium. Extensive coronary artery atherosclerosis and intimal irregularity of the thoracic aorta are noted. No acute systemic vascular findings are seen. The heart size is normal. There is no pericardial effusion. Mediastinum/Nodes: There are no enlarged mediastinal, hilar or axillary lymph nodes.Small mediastinal lymph nodes are unchanged. The thyroid gland, trachea and esophagus demonstrate no significant findings. Lungs/Pleura: No pleural effusion or pneumothorax. Mild diffuse central airway thickening with scattered linear scarring in the right lower lobe and lingula. No suspicious pulmonary nodules. Probable dependent secretions within the right mainstem bronchus. Upper abdomen: The visualized upper abdomen demonstrates no acute findings. There is intrahepatic biliary dilatation which appears similar to the prior study. No visualized pneumobilia within the superior aspect of the liver. Musculoskeletal/Chest wall: There is no chest wall mass or suspicious osseous finding. Thoracic spondylosis. Review of the MIP images confirms the above findings. IMPRESSION: 1. No evidence of acute pulmonary embolism or other acute chest findings. 2. Coronary and Aortic Atherosclerosis (ICD10-I70.0). 3. Central airway thickening with probable dependent secretions in the right mainstem bronchus. 4. No evidence of thoracic metastatic disease. 5. Grossly stable intrahepatic biliary dilatation, incompletely visualized. Electronically Signed   By: Richardean Sale M.D.   On: 09/08/2021 13:56     Assessment and Plan:   #NSTEMI: Patient presents with substernal chest pressure that awoke him from sleep found to have trop 17000 and subtle ST changes in the inferior leads  consistent with NSTEMI. Last TTE in 05/2021 obtained for bacteremia showed normal EF, normal RV, and no significant valve disease. Given ongoing chest pain and significantly elevated trop, will transfer to [facility] for urgent catheterization. -Transfer to [facility] for urgent cath -S/p ASA 325mg ; will start ASA 81mg  daily tomorrow -Lipitor 40mg  daily -Heparin gtt -Nitro gtt -Will start BB, ARB at [facility] -Check TTE   #Intrahepatic Metastatic Cholangiocarcinoma:  S/p biliary stenting and right portal vein embolization with surgical intervention. Has port in place and currently undergoing chemo. -Follow-up with oncologist Dr. Delton Coombes currently on Cisplatin + Gemcitabine D1,8 q21d  #Tobacco Abuse: Continues to smoke.  -Cessation recommended  #PAD: Saw Dr. Sherren Mocha and was recommended for peripheral angiography. Will need follow-up as out-patient. -ASA and statin as above  #DMII: -Management per primary  #Chronic HypoNa: Stable. -Management per primary  #HTN: -Start ARB/BB as able  INFORMED CONSENT: I have reviewed the risks, indications, and alternatives to cardiac catheterization, possible angioplasty, and stenting with the patient. Risks include but are not limited to bleeding, infection, vascular injury, stroke, myocardial infection, arrhythmia, kidney injury, radiation-related injury in the case of prolonged fluoroscopy use, emergency cardiac surgery, and death. The patient understands the risks of serious complication is 1-2 in 1093 with diagnostic cardiac cath and 1-2% or less with angioplasty/stenting.     Risk Assessment/Risk Scores:     TIMI Risk Score for Unstable Angina or Non-ST Elevation MI:   The patient's TIMI risk score is 7, which indicates a 41% risk of all cause mortality, new or recurrent myocardial infarction or need for urgent revascularization in the next 14  days.{           For questions or updates, please contact [place] Please consult [email or web address] for contact  info under    Signed, Freada Bergeron, MD  09/08/2021 3:29 PM

## 2021-09-08 NOTE — Progress Notes (Signed)
ANTICOAGULATION CONSULT NOTE - Follow Up Consult  Pharmacy Consult for IV Heparin Indication: chest pain/ACS  No Known Allergies  Patient Measurements: Height: 5\' 11"  (180.3 cm) Weight: 69.9 kg (154 lb) IBW/kg (Calculated) : 75.3 HEPARIN DW (KG): 69.9   Vital Signs: Temp: 99.6 F (37.6 C) (01/30 1432) Temp Source: Oral (01/30 1432) BP: 105/59 (01/30 1655) Pulse Rate: 83 (01/30 1655)  Labs: Recent Labs    09/08/21 1219 09/08/21 1419  HGB 10.0*  --   HCT 29.3*  --   PLT 182  --   CREATININE 0.85  --   TROPONINIHS 17,119* 14,964*    Estimated Creatinine Clearance: 85.7 mL/min (by C-G formula based on SCr of 0.85 mg/dL).  Medical History: Past Medical History:  Diagnosis Date   Cancer ([place])    Chronic pain    neck, back, knees   Class 1 obesity due to excess calories with body mass index (BMI) of 31.0 to 31.9 in adult    Claudication [facility])    DDD (degenerative disc disease), cervical    Diabetes mellitus ([place])    Dyslipidemia    Hypertension    Port-A-Cath in place 07/20/2021   Tobacco dependence     Medications:  See med rec  Assessment: 66 yr old man with hx of intrahepatic metastatic cholangiocarcinoma (S/P biliary stenting and R portal vein embolization), HLD, tobacco use, HTN, PAD, DMII presented to [facility] with CP, elevated troponins (17,119 >14,964). Pharmacy was consulted to dose IV heparin for ACS; per med rec, pt was not on anticoagulant PTA.   Pt was transferred to [facility] for cardiac cath. Prior to transfer, pt rec'd heparin 4000 units IV bolus X 1, followed by heparin infusion at 850 units/hr. Pharmacy was consulted to resume heparin infusion 8 hrs after sheath removal (per cardiac cath procedure log, sheath was removed at 1601 PM this afternoon). H/H 10.0/29.3, plt 182.  Goal of Therapy:  Heparin level 0.3-0.7 units/ml Monitor platelets by anticoagulation protocol: Yes   Plan:  Resume heparin infusion at 850 units/hr at  midnight tonight (~8 hrs after sheath removal) Check heparin level 6 hrs after resuming heparin Monitor daily heparin level, CBC Monitor for bleeding  Gillermina Hu, PharmD, BCPS, [facility] Clinical Pharmacist 09/08/2021,4:58 PM

## 2021-09-08 NOTE — Progress Notes (Signed)
ANTICOAGULATION CONSULT NOTE - Initial Consult  Pharmacy Consult for heparin Indication: chest pain/ACS  No Known Allergies  Patient Measurements: Height: 5\' 11"  (180.3 cm) Weight: 69.9 kg (154 lb) IBW/kg (Calculated) : 75.3 HEPARIN DW (KG): 69.9   Vital Signs: Temp: 98.9 F (37.2 C) (01/30 1057) Temp Source: Oral (01/30 1057) BP: 120/70 (01/30 1230) Pulse Rate: 78 (01/30 1230)  Labs: Recent Labs    09/08/21 1219  HGB 10.0*  HCT 29.3*  PLT 182  CREATININE 0.85  TROPONINIHS 17,119*    Estimated Creatinine Clearance: 85.7 mL/min (by C-G formula based on SCr of 0.85 mg/dL).   Medical History: Past Medical History:  Diagnosis Date   Cancer ([place])    Chronic pain    neck, back, knees   Class 1 obesity due to excess calories with body mass index (BMI) of 31.0 to 31.9 in adult    Claudication [facility])    DDD (degenerative disc disease), cervical    Diabetes mellitus ([place])    Dyslipidemia    Hypertension    Port-A-Cath in place 07/20/2021   Tobacco dependence     Medications:  See med rec  Assessment: Patient with chest pain last night. No nausea and no SOB. Patient is not on oral anticoagulants. Troponins are elevated. Pharmacy asked to start heparin.  Goal of Therapy:  Heparin level 0.3-0.7 units/ml Monitor platelets by anticoagulation protocol: Yes   Plan:  Give 4000 units bolus x 1 Start heparin infusion at 850 units/hr Check anti-Xa level in ~6 hours and daily while on heparin Continue to monitor H&H and platelets  Isac Sarna, BS Vena [place], BCPS Clinical Pharmacist Pager [phone] 09/08/2021,12:57 PM

## 2021-09-08 NOTE — Consult Note (Signed)
Cardiology Consultation:   Patient ID: MATYAS BAISLEY MRN: [id number]; DOB: March 06, 1956  Admit date: 09/08/2021 Date of Consult: 09/08/2021  PCP:  [name], PA-C   [facility] Providers Cardiologist:  None   {   Patient Profile:   EBERARDO DEMELLO is a 66 y.o. male with a hx of intrahepatic metastatic cholangiocarcinoma status post biliary stenting and right portal vein embolization, HLD, tobacco abuse, HTN, PAD, and DMII who is being seen 09/08/2021 for the evaluation of NSTEMI at the request of Dr. Denton Brick.  History of Present Illness:   Mr. Noon 66 year old male with history as detailed above with no known cardiac history who presented to [facility] with substernal chest pressure that began on the evening of admission found to have troponin of [address] for which Cardiology has been consulted.  The patient states that he was doing overall okay until yesterday evening when he developed substernal chest pressure that awoke him from sleep. He tried to just rest and see if it would go away, however, his symptoms persisted prompting him to go to the [facility]. Prior to the onset of this episode, the patient was feeling progressively fatigued and weak which he attributed to his chemotherapy, but no exertional chest pain or prior episodes of chest pain. He does have chronic dyspnea on exertion from underlying COPD which was not worsened from baseline.  In the ER, trop 17,000. ECG with very subtle STE in inferior leads. Initially STEMI doc was paged, but was deemed not to be a STEMI. CTA negative for PE but with coronary calcification. He was placed on heparin gtt and nitro gtt.   On my interview, the patient was continuing to have 6/10 chest pain. Given symptoms and troponin, the decision was made to transfer to [facility] for urgent coronary angiography.   Past Medical History:  Diagnosis Date   Cancer ([place])    Chronic pain    neck, back, knees   Class 1 obesity due to excess calories with body mass  index (BMI) of 31.0 to 31.9 in adult    Claudication [facility])    DDD (degenerative disc disease), cervical    Diabetes mellitus ([place])    Dyslipidemia    Hypertension    Port-A-Cath in place 07/20/2021   Tobacco dependence     Past Surgical History:  Procedure Laterality Date   BILIARY BRUSHING N/A 02/25/2021   Procedure: BILIARY BRUSHING;  Surgeon: Rogene Houston, MD;  Location: [facility];  Service: Gastroenterology;  Laterality: N/A;   BILIARY STENT PLACEMENT N/A 02/25/2021   Procedure: BILIARY STENT PLACEMENT;  Surgeon: Rogene Houston, MD;  Location: [facility];  Service: Gastroenterology;  Laterality: N/A;   BILIARY STENT PLACEMENT  02/27/2021   Procedure: BILIARY STENT PLACEMENT (10FR x 9cm) IN THE RIGHT SYSTEM;  Surgeon: Rogene Houston, MD;  Location: [facility];  Service: Gastroenterology;;   BREAST SURGERY Left    benign lump- in his 66s.   ERCP N/A 02/25/2021   Procedure: ENDOSCOPIC RETROGRADE CHOLANGIOPANCREATOGRAPHY (ERCP);  Surgeon: Rogene Houston, MD;  Location: [facility];  Service: Gastroenterology;  Laterality: N/A;   ERCP N/A 02/27/2021   Procedure: ENDOSCOPIC RETROGRADE CHOLANGIOPANCREATOGRAPHY (ERCP);  Surgeon: Rogene Houston, MD;  Location: [facility];  Service: Gastroenterology;  Laterality: N/A;   IR IMAGING GUIDED PORT INSERTION  07/21/2021   KNEE SURGERY Left    x2   SPHINCTEROTOMY N/A 02/25/2021   Procedure: SPHINCTEROTOMY;  Surgeon: Rogene Houston, MD;  Location: [facility];  Service: Gastroenterology;  Laterality: N/A;   STENT REMOVAL  02/27/2021   Procedure: STENT REMOVAL (8.5Fr x 9cm);  Surgeon: Rogene Houston, MD;  Location: [facility];  Service: Gastroenterology;;     Home Medications:  Prior to Admission medications   Medication Sig Start Date End Date Taking? Authorizing Provider  acetaminophen (TYLENOL) 500 MG tablet Take 1,000 mg by mouth every 6 (six) hours as needed for moderate pain or headache.   Yes [provider]  aspirin EC 81 MG tablet Take 81 mg by  mouth as needed (chest pain). Swallow whole.   Yes [provider]  CISPLATIN IV Inject into the vein once a week. Days 1, 8 every 21 days 07/22/21  Yes [provider]  Dextromethorphan-guaiFENesin (ROBITUSSIN DM PO) Take 1 Dose by mouth at bedtime as needed (allergy flare up). 2 tsp at night as needed for allergy flare ups   Yes [provider]  Durvalumab (IMFINZI IV) Inject into the vein every 21 ( twenty-one) days. 07/22/21  Yes [provider]  famotidine (PEPCID) 20 MG tablet Take 20 mg by mouth daily.   Yes [provider]  ferrous sulfate (FERROUSUL) 325 (65 FE) MG tablet Take 1 tablet (325 mg total) by mouth daily with breakfast. 03/18/21  Yes Rehman, Mechele Dawley, MD  Gemcitabine HCl (GEMZAR IV) Inject into the vein once a week. Days 1, 8 every 21 days 07/22/21  Yes [provider]  lidocaine-prilocaine (EMLA) cream Apply a small amount to port a cath site and cover with plastic wrap 1 hour prior to chemotherapy appointments 07/21/21  Yes Derek Jack, MD  loratadine (CLARITIN) 10 MG tablet Take 10 mg by mouth daily. Takes for a few days after shot to boost WBC. About every 2nd treatment   Yes [provider]  metFORMIN (GLUCOPHAGE) 500 MG tablet Take 1 tablet (500 mg total) by mouth 2 (two) times daily with a meal. 05/29/21 09/08/21 Yes Dessa Phi, DO  prochlorperazine (COMPAZINE) 10 MG tablet Take 1 tablet (10 mg total) by mouth every 6 (six) hours as needed (Nausea or vomiting). Patient taking differently: Take 10 mg by mouth every 8 (eight) hours as needed for nausea or vomiting (Nausea or vomiting). 07/21/21  Yes Derek Jack, MD  tamsulosin (FLOMAX) 0.4 MG CAPS capsule Take 1 capsule (0.4 mg total) by mouth at bedtime. 08/12/21  Yes Derek Jack, MD  megestrol (MEGACE) 400 MG/10ML suspension Take 10 mLs (400 mg total) by mouth 2 (two) times daily. Patient not taking: Reported on 09/08/2021 07/17/21    Derek Jack, MD  methylphenidate (RITALIN) 5 MG tablet Take 1 tablet (5 mg total) by mouth daily as needed. Patient not taking: Reported on 09/02/2021 07/29/21   Derek Jack, MD    Inpatient Medications: Scheduled Meds:  [MAR Hold] atorvastatin  40 mg Oral q1800   [MAR Hold] insulin aspart  0-5 Units Subcutaneous QHS   [MAR Hold] insulin aspart  0-6 Units Subcutaneous TID WC   Continuous Infusions:  heparin 850 Units/hr (09/08/21 1320)   nitroGLYCERIN 10 mcg/min (09/08/21 1432)   PRN Meds:   Allergies:   No Known Allergies  Social History:   Social History   Socioeconomic History   Marital status: Married    Spouse name: Not on file   Number of children: Not on file   Years of education: Not on file   Highest education level: Not on file  Occupational History   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  Tobacco Use  Smoking status: Every Day    Packs/day: 0.50    Years: 56.00    Pack years: 28.00    Types: Cigarettes   Smokeless tobacco: Never  Vaping Use   Vaping Use: Never used  Substance and Sexual Activity   Alcohol use: Not Currently    Comment: stopped 2.5 years ago 03/11/21   Drug use: Never   Sexual activity: Not on file  Other Topics Concern   Not on file  Social History Narrative   Not on file   Social Determinants of Health   Financial Resource Strain: Medium Risk   Difficulty of Paying Living Expenses: Somewhat hard  Food Insecurity: No Food Insecurity   Worried About Charity fundraiser in the Last Year: Never true   Ran Out of Food in the Last Year: Never true  Transportation Needs: No Transportation Needs   Lack of Transportation (Medical): No   Lack of Transportation (Non-Medical): No  Physical Activity: Insufficiently Active   Days of Exercise per Week: 2 days   Minutes of Exercise per Session: 20 min  Stress: No Stress Concern Present   Feeling of Stress : Only a little  Social Connections: Moderately Integrated    Frequency of Communication with Friends and Family: More than three times a week   Frequency of Social Gatherings with Friends and Family: More than three times a week   Attends Religious Services: More than 4 times per year   Active Member of [employer] or Organizations: No   Attends Music therapist: Never   Marital Status: Married  Human resources officer Violence: Not At Risk   Fear of Current or Ex-Partner: No   Emotionally Abused: No   Physically Abused: No   Sexually Abused: No    Family History:    Family History  Problem Relation Age of Onset   CAD Mother    Cancer Neg Hx      ROS:  Please see the history of present illness.  Review of Systems  Constitutional:  Positive for malaise/fatigue and weight loss.  Respiratory:  Positive for shortness of breath.   Cardiovascular:  Positive for chest pain. Negative for palpitations, orthopnea, claudication, leg swelling and PND.  Gastrointestinal:  Negative for blood in stool and melena.  Genitourinary:  Negative for hematuria.  Musculoskeletal:  Positive for joint pain.  Neurological:  Negative for dizziness and loss of consciousness.    Physical Exam/Data:   Vitals:   09/08/21 1300 09/08/21 1315 09/08/21 1432 09/08/21 1529  BP: 108/60 109/67 (!) 91/59   Pulse: 95 88 85   Resp: 19 17 17    Temp:   99.6 F (37.6 C)   TempSrc:   Oral   SpO2: 97% 100% 100% 97%  Weight:      Height:       No intake or output data in the 24 hours ending 09/08/21 1529 Last 3 Weights 09/08/2021 09/02/2021 09/01/2021  Weight (lbs) 154 lb 154 lb 12.8 oz 159 lb  Weight (kg) 69.854 kg 70.217 kg 72.122 kg     Body mass index is 21.48 kg/m.  General:  Mildly uncomfortable appearing HEENT: normal Neck: no JVD Vascular: No carotid bruits; Distal pulses 2+ bilaterally Cardiac:  RR, no murmurs Lungs:  Diminished throughout. Faint expiratory wheezing Abd: soft, nontender, no hepatomegaly  Ext: no edema Musculoskeletal:  No deformities, BUE and  BLE strength normal and equal Skin: warm and dry  Neuro:  CNs 2-12 intact, no focal abnormalities noted Psych:  Normal affect   EKG:  The EKG was personally reviewed and demonstrates:  NSR, <3mm STE in inferior leads Telemetry:  Telemetry was personally reviewed and demonstrates:  NSR  Relevant CV Studies: TTE 09/08/21: IMPRESSIONS     1. Left ventricular ejection fraction, by estimation, is 55 to 60%. The  left ventricle has normal function. The left ventricle has no regional  wall motion abnormalities. Left ventricular diastolic parameters were  normal.   2. Right ventricular systolic function is normal. The right ventricular  size is normal. Tricuspid regurgitation signal is inadequate for assessing  PA pressure.   3. The mitral valve is grossly normal. Trivial mitral valve  regurgitation.   4. The aortic valve is tricuspid. There is mild calcification of the  aortic valve. Aortic valve regurgitation is not visualized.   5. The inferior vena cava is normal in size with greater than 50%  respiratory variability, suggesting right atrial pressure of 3 mmHg.   6. No obvious valvular vegetations.   Comparison(s): No prior Echocardiogram.  Laboratory Data:  High Sensitivity Troponin:   Recent Labs  Lab 09/08/21 1219 09/08/21 1419  TROPONINIHS 17,119* 14,964*     Chemistry Recent Labs  Lab 09/02/21 0805 09/08/21 1219  NA 131* 131*  K 3.8 4.6  CL 101 102  CO2 24 23  GLUCOSE 203* 130*  BUN 10 18  CREATININE 0.84 0.85  CALCIUM 8.8* 8.6*  MG 1.8  --   GFRNONAA >60 >60  ANIONGAP 6 6    Recent Labs  Lab 09/02/21 0805 09/08/21 1219  PROT 6.7 7.0  ALBUMIN 3.1* 2.9*  AST 52* 101*  ALT 58* 83*  ALKPHOS 623* 381*  BILITOT 1.1 2.2*   Lipids No results for input(s): CHOL, TRIG, HDL, LABVLDL, LDLCALC, CHOLHDL in the last 168 hours.  Hematology Recent Labs  Lab 09/02/21 0805 09/08/21 1219  WBC 13.7* 12.5*  RBC 3.40* 2.95*  HGB 11.1* 10.0*  HCT 32.8* 29.3*  MCV  96.5 99.3  MCH 32.6 33.9  MCHC 33.8 34.1  RDW 17.0* 16.7*  PLT 290 182   Thyroid No results for input(s): TSH, FREET4 in the last 168 hours.  BNPNo results for input(s): BNP, PROBNP in the last 168 hours.  DDimer  Recent Labs  Lab 09/08/21 1219  DDIMER 1.77*     Radiology/Studies:  DG Chest 2 View  Result Date: 09/08/2021 CLINICAL DATA:  Provided history: Chest pain. Additional history provided: Patient reports history of bile duct cancer currently on chemotherapy. EXAM: CHEST - 2 VIEW COMPARISON:  Prior chest radiographs 05/25/2021 and earlier. Chest CT 03/12/2021. FINDINGS: A right chest infusion port catheter is present with tip projecting at the level of the lower SVC. Heart size within normal limits. No appreciable airspace consolidation or pulmonary edema. No evidence of pleural effusion or pneumothorax. No acute bony abnormality identified. Degenerative changes of the spine. Redemonstrated metallic coils within the right upper abdomen. IMPRESSION: No evidence of acute cardiopulmonary abnormality. Electronically Signed   By: Kellie Simmering D.O.   On: 09/08/2021 12:21   CT Angio Chest PE W and/or Wo Contrast  Result Date: 09/08/2021 CLINICAL DATA:  Chest pain last night with elevated D-dimer levels. Concern for pulmonary embolism. History of metastatic Linda carcinoma, hypertension and diabetes. EXAM: CT ANGIOGRAPHY CHEST WITH CONTRAST TECHNIQUE: Multidetector CT imaging of the chest was performed using the standard protocol during bolus administration of intravenous contrast. Multiplanar CT image reconstructions and MIPs were obtained to evaluate the vascular anatomy. RADIATION DOSE REDUCTION:  This exam was performed according to the departmental dose-optimization program which includes automated exposure control, adjustment of the mA and/or kV according to patient size and/or use of iterative reconstruction technique. CONTRAST:  54mL OMNIPAQUE IOHEXOL 350 MG/ML SOLN COMPARISON:  Chest CT  03/12/2021. Abdominal CT 05/25/2021. FINDINGS: Cardiovascular: The pulmonary arteries are well opacified with contrast to the level of the subsegmental branches. There is no evidence of acute pulmonary embolism. Right IJ Port-A-Cath extends to the upper right atrium. Extensive coronary artery atherosclerosis and intimal irregularity of the thoracic aorta are noted. No acute systemic vascular findings are seen. The heart size is normal. There is no pericardial effusion. Mediastinum/Nodes: There are no enlarged mediastinal, hilar or axillary lymph nodes.Small mediastinal lymph nodes are unchanged. The thyroid gland, trachea and esophagus demonstrate no significant findings. Lungs/Pleura: No pleural effusion or pneumothorax. Mild diffuse central airway thickening with scattered linear scarring in the right lower lobe and lingula. No suspicious pulmonary nodules. Probable dependent secretions within the right mainstem bronchus. Upper abdomen: The visualized upper abdomen demonstrates no acute findings. There is intrahepatic biliary dilatation which appears similar to the prior study. No visualized pneumobilia within the superior aspect of the liver. Musculoskeletal/Chest wall: There is no chest wall mass or suspicious osseous finding. Thoracic spondylosis. Review of the MIP images confirms the above findings. IMPRESSION: 1. No evidence of acute pulmonary embolism or other acute chest findings. 2. Coronary and Aortic Atherosclerosis (ICD10-I70.0). 3. Central airway thickening with probable dependent secretions in the right mainstem bronchus. 4. No evidence of thoracic metastatic disease. 5. Grossly stable intrahepatic biliary dilatation, incompletely visualized. Electronically Signed   By: Richardean Sale M.D.   On: 09/08/2021 13:56     Assessment and Plan:   #NSTEMI: Patient presents with substernal chest pressure that awoke him from sleep found to have trop 17000 and subtle ST changes in the inferior leads  consistent with NSTEMI. Last TTE in 05/2021 obtained for bacteremia showed normal EF, normal RV, and no significant valve disease. Given ongoing chest pain and significantly elevated trop, will transfer to [facility] for urgent catheterization. -Transfer to [facility] for urgent cath -S/p ASA 325mg ; will start ASA 81mg  daily tomorrow -Lipitor 40mg  daily -Heparin gtt -Nitro gtt -Will start BB, ARB at [facility] -Check TTE   #Intrahepatic Metastatic Cholangiocarcinoma:  S/p biliary stenting and right portal vein embolization with surgical intervention. Has port in place and currently undergoing chemo. -Follow-up with oncologist Dr. Delton Coombes currently on Cisplatin + Gemcitabine D1,8 q21d  #Tobacco Abuse: Continues to smoke.  -Cessation recommended  #PAD: Saw Dr. Sherren Mocha and was recommended for peripheral angiography. Will need follow-up as out-patient. -ASA and statin as above  #DMII: -Management per primary  #Chronic HypoNa: Stable. -Management per primary  #HTN: -Start ARB/BB as able  INFORMED CONSENT: I have reviewed the risks, indications, and alternatives to cardiac catheterization, possible angioplasty, and stenting with the patient. Risks include but are not limited to bleeding, infection, vascular injury, stroke, myocardial infection, arrhythmia, kidney injury, radiation-related injury in the case of prolonged fluoroscopy use, emergency cardiac surgery, and death. The patient understands the risks of serious complication is 1-2 in 3500 with diagnostic cardiac cath and 1-2% or less with angioplasty/stenting.     Risk Assessment/Risk Scores:     TIMI Risk Score for Unstable Angina or Non-ST Elevation MI:   The patient's TIMI risk score is 7, which indicates a 41% risk of all cause mortality, new or recurrent myocardial infarction or need for urgent revascularization in the next 14  days.{           For questions or updates, please contact [place] Please consult [email or web address] for contact  info under    Signed, Freada Bergeron, MD  09/08/2021 3:29 PM

## 2021-09-08 NOTE — H&P (Signed)
Patient Demographics:    Jonathan Keith, is a 66 y.o. male  MRN: [id number]   DOB - 1956/02/16  Admit Date - 09/08/2021  Outpatient Primary MD for the patient is [name], PA-C   Assessment & Plan:    Principal Problem:   NSTEMI (non-ST elevated myocardial infarction) [facility]) Active Problems:   Obstructive jaundice   Diabetes mellitus ([place])   Hypertension   Tobacco dependence   Cholangiocarcinoma ([place])   Biliary tract cancer ([place])   Port-A-Cath in place   Atherosclerosis of native arteries of extremities with intermittent claudication, bilateral legs (HCC)   Type 2 diabetes mellitus without complications (HCC)  1)NSTEMI-chest pains with elevated troponins and nonspecific EKG changes -D-dimer 1.77, CTA chest without PE, or other acute cardiopulmonary findings -Patient was seen by cardiology in the ED at [facility], IV nitro and IV heparin initiated with plans for [facility] at [facility] on 09/08/2021 -EKG reviewed -Recent echo from 05/29/2021 with EF of 55 to 60% without regional wall motion abnormalities or diastolic dysfunction--we will defer to cardiology service regarding timing of repeat echocardiogram Troponin 17,119, repeat troponin pending -Patient received aspirin, -Fasting lipid profile and Lipitor as ordered -Defer decision on beta-blocker to cardiology service  2)intrahepatic metastatic cholangiocarcinoma status post biliary stenting and right portal vein embolization with surgical intervention --- status post prior ERCP with enterotomy with subsequent plastic biliary stent placement- -elevated LFTs noted with AST of 101 ALT of 83 alk phos of 381 and T bili of 2.2 -Patient sees oncologist Dr. Delton Coombes currently on Cisplatin + Gemcitabine D1,8 q21d   3)Tobacco abuse--- smoking cessation  advised  4) chronic hyponatremia--- sodium is 131 suspect due to underlying malignancy -sodium is at baseline -Avoid dehydration  5) chronic anemia--- suspect related to underlying malignancy and chemotherapy treatments -Continue PTA iron tablet -Hgb 10.0 which is close to patient's baseline -No evidence of bleeding at this time, platelets WNL - 6)DM2-A1c was previously 8.7 reflecting uncontrolled DM with hyperglycemia PTA -Repeat A1c pending -Hold metformin due to contrast exposure Use Novolog/Humalog Sliding scale insulin with Accu-Cheks/Fingersticks as ordered   7)HTN--currently on IV nitro drip for chest pain/ACS - may use IV Hydralazine 10 mg  Every 4 hours Prn for systolic blood pressure over 170 mmhg   Disposition/Need for in-Hospital Stay- patient unable to be discharged at this time due to NSTEMI requiring IV heparin/nitro drip-- LHC at [facility] on 09/08/2021  Status is: Inpatient  Remains inpatient appropriate because:   Dispo: The patient is from: Home              Anticipated d/c is to: Home              Anticipated d/c date is: 2 days              Patient currently is not medically stable to d/c. Barriers: Not Clinically Stable-    With History of - Reviewed by me  Past  Medical History:  Diagnosis Date   Cancer [facility])    Chronic pain    neck, back, knees   Class 1 obesity due to excess calories with body mass index (BMI) of 31.0 to 31.9 in adult    Claudication [facility])    DDD (degenerative disc disease), cervical    Diabetes mellitus ([place])    Dyslipidemia    Hypertension    Port-A-Cath in place 07/20/2021   Tobacco dependence       Past Surgical History:  Procedure Laterality Date   BILIARY BRUSHING N/A 02/25/2021   Procedure: BILIARY BRUSHING;  Surgeon: Rogene Houston, MD;  Location: [facility];  Service: Gastroenterology;  Laterality: N/A;   BILIARY STENT PLACEMENT N/A 02/25/2021   Procedure: BILIARY STENT PLACEMENT;  Surgeon: Rogene Houston, MD;   Location: [facility] ORS;  Service: Gastroenterology;  Laterality: N/A;   BILIARY STENT PLACEMENT  02/27/2021   Procedure: BILIARY STENT PLACEMENT (10FR x 9cm) IN THE RIGHT SYSTEM;  Surgeon: Rogene Houston, MD;  Location: [facility] ORS;  Service: Gastroenterology;;   BREAST SURGERY Left    benign lump- in his 92s.   ERCP N/A 02/25/2021   Procedure: ENDOSCOPIC RETROGRADE CHOLANGIOPANCREATOGRAPHY (ERCP);  Surgeon: Rogene Houston, MD;  Location: [facility] ORS;  Service: Gastroenterology;  Laterality: N/A;   ERCP N/A 02/27/2021   Procedure: ENDOSCOPIC RETROGRADE CHOLANGIOPANCREATOGRAPHY (ERCP);  Surgeon: Rogene Houston, MD;  Location: [facility];  Service: Gastroenterology;  Laterality: N/A;   IR IMAGING GUIDED PORT INSERTION  07/21/2021   KNEE SURGERY Left    x2   SPHINCTEROTOMY N/A 02/25/2021   Procedure: SPHINCTEROTOMY;  Surgeon: Rogene Houston, MD;  Location: [facility];  Service: Gastroenterology;  Laterality: N/A;   STENT REMOVAL  02/27/2021   Procedure: STENT REMOVAL (8.5Fr x 9cm);  Surgeon: Rogene Houston, MD;  Location: [facility];  Service: Gastroenterology;;      Chief Complaint  Patient presents with   Chest Pain    dizziness      HPI:    Jonathan Keith  is a 66 y.o. male  with history of hypertension, diabetes mellitus type 2, intrahepatic metastatic cholangiocarcinoma status post biliary stenting and right portal vein embolization with surgical intervention and tobacco abuse presents to the ED with complaints of 7 out of 10 chest pain since 09/07/2020, associated with dizziness especially orthostatic dizziness and fatigue -Chest pains at times exertional, also worse with deep inspiration, chest pain was pretty persistent and only improved after IV nitro in the ED -No fever  Or chills   No Nausea, Vomiting or Diarrhea - Additional history obtained from patient's wife at bedside -Patient was seen by cardiology in the ED at [facility], IV nitro and IV heparin initiated with plans for [facility] on  09/08/2021 -EKG reviewed -Recent echo from 05/29/2021 with EF of 55 to 60% without regional wall motion abnormalities or diastolic dysfunction--we will defer to cardiology service regarding timing of repeat echocardiogram Troponin 17,119 -- COVID and flu negative -D-dimer 1.77, CTA chest without PE, or other acute cardiopulmonary findings -WBC 12.5, hemoglobin 10.0, platelets 182 -Sodium is 131, creatinine 0.85, elevated LFTs noted with AST of 101 ALT of 83 alk phos of 381 and T bili of 2.2 - Patient has been transferred to [facility] for [facility] and further cardiology evaluation   Review of systems:    In addition to the HPI above,   A full Review of  Systems was done, all other systems reviewed are negative  except as noted above in HPI , .    Social History:  Reviewed by me    Social History   Tobacco Use   Smoking status: Every Day    Packs/day: 0.50    Years: 56.00    Pack years: 28.00    Types: Cigarettes   Smokeless tobacco: Never  Substance Use Topics   Alcohol use: Not Currently    Comment: stopped 2.5 years ago 03/11/21       Family History :  Reviewed by me    Family History  Problem Relation Age of Onset   CAD Mother    Cancer Neg Hx      Home Medications:   Prior to Admission medications   Medication Sig Start Date End Date Taking? Authorizing Provider  acetaminophen (TYLENOL) 500 MG tablet Take 1,000 mg by mouth every 6 (six) hours as needed for moderate pain or headache.   Yes [provider]  aspirin EC 81 MG tablet Take 81 mg by mouth as needed (chest pain). Swallow whole.   Yes [provider]  CISPLATIN IV Inject into the vein once a week. Days 1, 8 every 21 days 07/22/21  Yes [provider]  Dextromethorphan-guaiFENesin (ROBITUSSIN DM PO) Take 1 Dose by mouth at bedtime as needed (allergy flare up). 2 tsp at night as needed for allergy flare ups   Yes [provider]  Durvalumab (IMFINZI IV) Inject into the vein  every 21 ( twenty-one) days. 07/22/21  Yes [provider]  famotidine (PEPCID) 20 MG tablet Take 20 mg by mouth daily.   Yes [provider]  ferrous sulfate (FERROUSUL) 325 (65 FE) MG tablet Take 1 tablet (325 mg total) by mouth daily with breakfast. 03/18/21  Yes Rehman, Mechele Dawley, MD  Gemcitabine HCl (GEMZAR IV) Inject into the vein once a week. Days 1, 8 every 21 days 07/22/21  Yes [provider]  lidocaine-prilocaine (EMLA) cream Apply a small amount to port a cath site and cover with plastic wrap 1 hour prior to chemotherapy appointments 07/21/21  Yes Derek Jack, MD  loratadine (CLARITIN) 10 MG tablet Take 10 mg by mouth daily. Takes for a few days after shot to boost WBC. About every 2nd treatment   Yes [provider]  metFORMIN (GLUCOPHAGE) 500 MG tablet Take 1 tablet (500 mg total) by mouth 2 (two) times daily with a meal. 05/29/21 09/08/21 Yes Dessa Phi, DO  prochlorperazine (COMPAZINE) 10 MG tablet Take 1 tablet (10 mg total) by mouth every 6 (six) hours as needed (Nausea or vomiting). Patient taking differently: Take 10 mg by mouth every 8 (eight) hours as needed for nausea or vomiting (Nausea or vomiting). 07/21/21  Yes Derek Jack, MD  tamsulosin (FLOMAX) 0.4 MG CAPS capsule Take 1 capsule (0.4 mg total) by mouth at bedtime. 08/12/21  Yes Derek Jack, MD  megestrol (MEGACE) 400 MG/10ML suspension Take 10 mLs (400 mg total) by mouth 2 (two) times daily. Patient not taking: Reported on 09/08/2021 07/17/21   Derek Jack, MD  methylphenidate (RITALIN) 5 MG tablet Take 1 tablet (5 mg total) by mouth daily as needed. Patient not taking: Reported on 09/02/2021 07/29/21   Derek Jack, MD     Allergies:    No Known Allergies   Physical Exam:   Vitals  Blood pressure (!) 91/59, pulse 85, temperature 99.6 F (37.6 C), temperature source Oral, resp. rate 17, height _0  (1.803 m), weight 69.9 kg, SpO2 100  %.  Physical Examination: General appearance - alert, and in no distress  Mental status - alert, oriented to person, place, and time,  Eyes - sclera anicteric Neck - supple, no JVD elevation , Chest - clear  to auscultation bilaterally, symmetrical air movement,  Heart - S1 and S2 normal, regular, right-sided Port-A-Cath in situ Abdomen - soft, nontender, nondistended, no masses or organomegaly Neurological - screening mental status exam normal, neck supple without rigidity, cranial nerves II through XII intact, DTR's normal and symmetric Extremities - no pedal edema noted, intact peripheral pulses  Skin - warm, dry   Data Review:    CBC Recent Labs  Lab 09/02/21 0805 09/08/21 1219  WBC 13.7* 12.5*  HGB 11.1* 10.0*  HCT 32.8* 29.3*  PLT 290 182  MCV 96.5 99.3  MCH 32.6 33.9  MCHC 33.8 34.1  RDW 17.0* 16.7*  LYMPHSABS 1.8 0.9  MONOABS 1.3* 1.4*  EOSABS 0.2 0.0  BASOSABS 0.1 0.0    Chemistries  Recent Labs  Lab 09/02/21 0805 09/08/21 1219  NA 131* 131*  K 3.8 4.6  CL 101 102  CO2 24 23  GLUCOSE 203* 130*  BUN 10 18  CREATININE 0.84 0.85  CALCIUM 8.8* 8.6*  MG 1.8  --   AST 52* 101*  ALT 58* 83*  ALKPHOS 623* 381*  BILITOT 1.1 2.2*   ------------------------------------------------------------------------------------------------------------------ estimated creatinine clearance is 85.7 mL/min (by C-G formula based on SCr of 0.85 mg/dL). ------------------------------------------------------------------------------------------------------------------ No results for input(s): TSH, T4TOTAL, T3FREE, THYROIDAB in the last 72 hours.  Invalid input(s): FREET3   Coagulation profile No results for input(s): INR, PROTIME in the last 168 hours. ------------------------------------------------------------------------------------------------------------------- Recent Labs    09/08/21 1219  DDIMER 1.77*   Urinalysis    Component Value Date/Time   COLORURINE AMBER  (A) 05/25/2021 1412   APPEARANCEUR HAZY (A) 05/25/2021 1412   LABSPEC 1.033 (H) 05/25/2021 1412   PHURINE 5.0 05/25/2021 1412   GLUCOSEU NEGATIVE 05/25/2021 1412   HGBUR NEGATIVE 05/25/2021 1412   BILIRUBINUR MODERATE (A) 05/25/2021 1412   KETONESUR 5 (A) 05/25/2021 1412   PROTEINUR 100 (A) 05/25/2021 1412   NITRITE NEGATIVE 05/25/2021 1412   LEUKOCYTESUR NEGATIVE 05/25/2021 1412     Imaging Results:    DG Chest 2 View  Result Date: 09/08/2021 CLINICAL DATA:  Provided history: Chest pain. Additional history provided: Patient reports history of bile duct cancer currently on chemotherapy. EXAM: CHEST - 2 VIEW COMPARISON:  Prior chest radiographs 05/25/2021 and earlier. Chest CT 03/12/2021. FINDINGS: A right chest infusion port catheter is present with tip projecting at the level of the lower SVC. Heart size within normal limits. No appreciable airspace consolidation or pulmonary edema. No evidence of pleural effusion or pneumothorax. No acute bony abnormality identified. Degenerative changes of the spine. Redemonstrated metallic coils within the right upper abdomen. IMPRESSION: No evidence of acute cardiopulmonary abnormality. Electronically Signed   By: Kellie Simmering D.O.   On: 09/08/2021 12:21   CT Angio Chest PE W and/or Wo Contrast  Result Date: 09/08/2021 CLINICAL DATA:  Chest pain last night with elevated D-dimer levels. Concern for pulmonary embolism. History of metastatic Linda carcinoma, hypertension and diabetes. EXAM: CT ANGIOGRAPHY CHEST WITH CONTRAST TECHNIQUE: Multidetector CT imaging of the chest was performed using the standard protocol during bolus administration of intravenous contrast. Multiplanar CT image reconstructions and MIPs were obtained to evaluate the vascular anatomy. RADIATION DOSE REDUCTION: This exam was performed according to the departmental dose-optimization program which includes automated exposure control, adjustment of the  mA and/or kV according to patient size  and/or use of iterative reconstruction technique. CONTRAST:  54m OMNIPAQUE IOHEXOL 350 MG/ML SOLN COMPARISON:  Chest CT 03/12/2021. Abdominal CT 05/25/2021. FINDINGS: Cardiovascular: The pulmonary arteries are well opacified with contrast to the level of the subsegmental branches. There is no evidence of acute pulmonary embolism. Right IJ Port-A-Cath extends to the upper right atrium. Extensive coronary artery atherosclerosis and intimal irregularity of the thoracic aorta are noted. No acute systemic vascular findings are seen. The heart size is normal. There is no pericardial effusion. Mediastinum/Nodes: There are no enlarged mediastinal, hilar or axillary lymph nodes.Small mediastinal lymph nodes are unchanged. The thyroid gland, trachea and esophagus demonstrate no significant findings. Lungs/Pleura: No pleural effusion or pneumothorax. Mild diffuse central airway thickening with scattered linear scarring in the right lower lobe and lingula. No suspicious pulmonary nodules. Probable dependent secretions within the right mainstem bronchus. Upper abdomen: The visualized upper abdomen demonstrates no acute findings. There is intrahepatic biliary dilatation which appears similar to the prior study. No visualized pneumobilia within the superior aspect of the liver. Musculoskeletal/Chest wall: There is no chest wall mass or suspicious osseous finding. Thoracic spondylosis. Review of the MIP images confirms the above findings. IMPRESSION: 1. No evidence of acute pulmonary embolism or other acute chest findings. 2. Coronary and Aortic Atherosclerosis (ICD10-I70.0). 3. Central airway thickening with probable dependent secretions in the right mainstem bronchus. 4. No evidence of thoracic metastatic disease. 5. Grossly stable intrahepatic biliary dilatation, incompletely visualized. Electronically Signed   By: WRichardean SaleM.D.   On: 09/08/2021 13:56    Radiological Exams on Admission: DG Chest 2 View  Result Date:  09/08/2021 CLINICAL DATA:  Provided history: Chest pain. Additional history provided: Patient reports history of bile duct cancer currently on chemotherapy. EXAM: CHEST - 2 VIEW COMPARISON:  Prior chest radiographs 05/25/2021 and earlier. Chest CT 03/12/2021. FINDINGS: A right chest infusion port catheter is present with tip projecting at the level of the lower SVC. Heart size within normal limits. No appreciable airspace consolidation or pulmonary edema. No evidence of pleural effusion or pneumothorax. No acute bony abnormality identified. Degenerative changes of the spine. Redemonstrated metallic coils within the right upper abdomen. IMPRESSION: No evidence of acute cardiopulmonary abnormality. Electronically Signed   By: KKellie SimmeringD.O.   On: 09/08/2021 12:21   CT Angio Chest PE W and/or Wo Contrast  Result Date: 09/08/2021 CLINICAL DATA:  Chest pain last night with elevated D-dimer levels. Concern for pulmonary embolism. History of metastatic Linda carcinoma, hypertension and diabetes. EXAM: CT ANGIOGRAPHY CHEST WITH CONTRAST TECHNIQUE: Multidetector CT imaging of the chest was performed using the standard protocol during bolus administration of intravenous contrast. Multiplanar CT image reconstructions and MIPs were obtained to evaluate the vascular anatomy. RADIATION DOSE REDUCTION: This exam was performed according to the departmental dose-optimization program which includes automated exposure control, adjustment of the mA and/or kV according to patient size and/or use of iterative reconstruction technique. CONTRAST:  83mOMNIPAQUE IOHEXOL 350 MG/ML SOLN COMPARISON:  Chest CT 03/12/2021. Abdominal CT 05/25/2021. FINDINGS: Cardiovascular: The pulmonary arteries are well opacified with contrast to the level of the subsegmental branches. There is no evidence of acute pulmonary embolism. Right IJ Port-A-Cath extends to the upper right atrium. Extensive coronary artery atherosclerosis and intimal  irregularity of the thoracic aorta are noted. No acute systemic vascular findings are seen. The heart size is normal. There is no pericardial effusion. Mediastinum/Nodes: There are no enlarged mediastinal, hilar or axillary lymph nodes.Small  mediastinal lymph nodes are unchanged. The thyroid gland, trachea and esophagus demonstrate no significant findings. Lungs/Pleura: No pleural effusion or pneumothorax. Mild diffuse central airway thickening with scattered linear scarring in the right lower lobe and lingula. No suspicious pulmonary nodules. Probable dependent secretions within the right mainstem bronchus. Upper abdomen: The visualized upper abdomen demonstrates no acute findings. There is intrahepatic biliary dilatation which appears similar to the prior study. No visualized pneumobilia within the superior aspect of the liver. Musculoskeletal/Chest wall: There is no chest wall mass or suspicious osseous finding. Thoracic spondylosis. Review of the MIP images confirms the above findings. IMPRESSION: 1. No evidence of acute pulmonary embolism or other acute chest findings. 2. Coronary and Aortic Atherosclerosis (ICD10-I70.0). 3. Central airway thickening with probable dependent secretions in the right mainstem bronchus. 4. No evidence of thoracic metastatic disease. 5. Grossly stable intrahepatic biliary dilatation, incompletely visualized. Electronically Signed   By: Richardean Sale M.D.   On: 09/08/2021 13:56    DVT Prophylaxis -SCD/iv heparin AM Labs Ordered, also please review Full Orders  Family Communication: Admission, patients condition and plan of care including tests being ordered have been discussed with the patient and wife at bedside who indicate understanding and agree with the plan   Code Status - Full Code  Likely DC to  home   Condition   stable  Roxan Hockey M.D on 09/08/2021 at 2:56 PM Go to [email or web address] -  for contact info  [facility] - Office  [phone]

## 2021-09-08 NOTE — ED Triage Notes (Signed)
Pt with mid CP since last night.  Denies any nausea or SOB Pepcid Sunday night and again today at 0530 C/o dizziness off and on since November per family member.  Pt has bile duct CA, currently on chemo-last chemo was last week.

## 2021-09-08 NOTE — ED Notes (Signed)
CRITICAL VALUE STICKER  CRITICAL VALUE: Troponin 17,119  RECEIVER (on-site recipient of call): Meli Faley RN  DATE & TIME NOTIFIED: 09/08/2021  MD NOTIFIED: Godfrey Pick, MD  TIME OF NOTIFICATION: 1250  RESPONSE:  N/A

## 2021-09-08 NOTE — ED Provider Notes (Signed)
[facility] EMERGENCY DEPARTMENT Provider Note   CSN: [id number] Arrival date & time: 09/08/21  1031     History  Chief Complaint  Patient presents with   Chest Pain    dizziness    Jonathan Keith is a 66 y.o. male.  HPI  Patient with medical history including diabetes, hypertension, metastatic Choleanginrcinoma receiving active chemotherapy presents to the ED with complaint of chest pain, chest pain started last night, came on while he was laying down pain is in the middle of his chest does not radiate describes as a pressure-like sensation worsened when he moves admits to pleuritic chest pain but denies shortness of breath denies him diaphoretic, nausea vomiting lightheaded or dizziness.  Denies any orthopnea or worsening peripheral edema, no significant cardiac history, no history of PEs or DVTs currently not on hormone therapy, admits to smoking, has hypertension and diabetes well controlled.  Reviewed patient's chart followed by Dr. Delton Coombes of oncology has received 3-4 cycles of chemotherapy scheduled to receive his next cycle tomorrow.  Home Medications Prior to Admission medications   Medication Sig Start Date End Date Taking? Authorizing Provider  acetaminophen (TYLENOL) 500 MG tablet Take 1,000 mg by mouth every 6 (six) hours as needed for moderate pain or headache.   Yes [provider]  aspirin EC 81 MG tablet Take 81 mg by mouth as needed (chest pain). Swallow whole.   Yes [provider]  CISPLATIN IV Inject into the vein once a week. Days 1, 8 every 21 days 07/22/21  Yes [provider]  Dextromethorphan-guaiFENesin (ROBITUSSIN DM PO) Take 1 Dose by mouth at bedtime as needed (allergy flare up). 2 tsp at night as needed for allergy flare ups   Yes [provider]  Durvalumab (IMFINZI IV) Inject into the vein every 21 ( twenty-one) days. 07/22/21  Yes [provider]  famotidine (PEPCID) 20 MG tablet Take 20 mg by mouth  daily.   Yes [provider]  ferrous sulfate (FERROUSUL) 325 (65 FE) MG tablet Take 1 tablet (325 mg total) by mouth daily with breakfast. 03/18/21  Yes Rehman, Mechele Dawley, MD  Gemcitabine HCl (GEMZAR IV) Inject into the vein once a week. Days 1, 8 every 21 days 07/22/21  Yes [provider]  lidocaine-prilocaine (EMLA) cream Apply a small amount to port a cath site and cover with plastic wrap 1 hour prior to chemotherapy appointments 07/21/21  Yes Derek Jack, MD  loratadine (CLARITIN) 10 MG tablet Take 10 mg by mouth daily. Takes for a few days after shot to boost WBC. About every 2nd treatment   Yes [provider]  metFORMIN (GLUCOPHAGE) 500 MG tablet Take 1 tablet (500 mg total) by mouth 2 (two) times daily with a meal. 05/29/21 09/08/21 Yes Dessa Phi, DO  prochlorperazine (COMPAZINE) 10 MG tablet Take 1 tablet (10 mg total) by mouth every 6 (six) hours as needed (Nausea or vomiting). Patient taking differently: Take 10 mg by mouth every 8 (eight) hours as needed for nausea or vomiting (Nausea or vomiting). 07/21/21  Yes Derek Jack, MD  tamsulosin (FLOMAX) 0.4 MG CAPS capsule Take 1 capsule (0.4 mg total) by mouth at bedtime. 08/12/21  Yes Derek Jack, MD  megestrol (MEGACE) 400 MG/10ML suspension Take 10 mLs (400 mg total) by mouth 2 (two) times daily. Patient not taking: Reported on 09/08/2021 07/17/21   Derek Jack, MD  methylphenidate (RITALIN) 5 MG tablet Take 1 tablet (5 mg total) by mouth daily as needed. Patient  not taking: Reported on 09/02/2021 07/29/21   Derek Jack, MD      Allergies    Patient has no known allergies.    Review of Systems   Review of Systems  Constitutional:  Negative for chills and fever.  Respiratory:  Positive for chest tightness. Negative for shortness of breath.   Cardiovascular:  Positive for chest pain. Negative for palpitations and leg swelling.  Gastrointestinal:  Negative for  abdominal pain, diarrhea, nausea and vomiting.  Neurological:  Negative for headaches.   Physical Exam Updated Vital Signs BP 109/67    Pulse 88    Temp 98.9 F (37.2 C) (Oral)    Resp 17    Ht _0  (1.803 m)    Wt 69.9 kg    SpO2 100%    BMI 21.48 kg/m  Physical Exam Vitals and nursing note reviewed.  Constitutional:      General: He is not in acute distress.    Appearance: He is not ill-appearing.  HENT:     Head: Normocephalic and atraumatic.     Nose: No congestion.  Eyes:     Conjunctiva/sclera: Conjunctivae normal.  Cardiovascular:     Rate and Rhythm: Normal rate and regular rhythm.     Pulses: Normal pulses.     Heart sounds: No murmur heard.   No friction rub. No gallop.  Pulmonary:     Effort: No respiratory distress.     Breath sounds: No wheezing, rhonchi or rales.  Abdominal:     Palpations: Abdomen is soft.     Tenderness: There is abdominal tenderness. There is no right CVA tenderness or left CVA tenderness.     Comments: Surgical scar present on abdomen, nondistended normal bowel sounds, dull to percussion, has slight tenderness to palpation epigastric region no guarding rebound tenderness or peritoneal sign.  Musculoskeletal:     Right lower leg: No edema.     Left lower leg: No edema.  Skin:    General: Skin is warm and dry.  Neurological:     Mental Status: He is alert.  Psychiatric:        Mood and Affect: Mood normal.    ED Results / Procedures / Treatments   Labs (all labs ordered are listed, but only abnormal results are displayed) Labs Reviewed  CBC WITH DIFFERENTIAL/PLATELET - Abnormal; Notable for the following components:      Result Value   WBC 12.5 (*)    RBC 2.95 (*)    Hemoglobin 10.0 (*)    HCT 29.3 (*)    RDW 16.7 (*)    Neutro Abs 10.0 (*)    Monocytes Absolute 1.4 (*)    All other components within normal limits  D-DIMER, QUANTITATIVE - Abnormal; Notable for the following components:   D-Dimer, Quant 1.77 (*)    All other  components within normal limits  COMPREHENSIVE METABOLIC PANEL - Abnormal; Notable for the following components:   Sodium 131 (*)    Glucose, Bld 130 (*)    Calcium 8.6 (*)    Albumin 2.9 (*)    AST 101 (*)    ALT 83 (*)    Alkaline Phosphatase 381 (*)    Total Bilirubin 2.2 (*)    All other components within normal limits  TROPONIN I (HIGH SENSITIVITY) - Abnormal; Notable for the following components:   Troponin I (High Sensitivity) 17,119 (*)    All other components within normal limits  RESP PANEL BY RT-PCR (FLU A&B, COVID) ARPGX2  LIPASE, BLOOD  HEPARIN LEVEL (UNFRACTIONATED)  TROPONIN I (HIGH SENSITIVITY)    EKG EKG Interpretation  Date/Time:  Monday September 08 2021 12:10:12 EST Ventricular Rate:  80 PR Interval:  165 QRS Duration: 84 QT Interval:  354 QTC Calculation: 409 R Axis:   -41 Text Interpretation: Sinus rhythm Confirmed by Godfrey Pick [id number] on 09/08/2021 12:45:11 PM  Radiology DG Chest 2 View  Result Date: 09/08/2021 CLINICAL DATA:  Provided history: Chest pain. Additional history provided: Patient reports history of bile duct cancer currently on chemotherapy. EXAM: CHEST - 2 VIEW COMPARISON:  Prior chest radiographs 05/25/2021 and earlier. Chest CT 03/12/2021. FINDINGS: A right chest infusion port catheter is present with tip projecting at the level of the lower SVC. Heart size within normal limits. No appreciable airspace consolidation or pulmonary edema. No evidence of pleural effusion or pneumothorax. No acute bony abnormality identified. Degenerative changes of the spine. Redemonstrated metallic coils within the right upper abdomen. IMPRESSION: No evidence of acute cardiopulmonary abnormality. Electronically Signed   By: Kellie Simmering D.O.   On: 09/08/2021 12:21    Procedures .Critical Care Performed by: Marcello Fennel, PA-C Authorized by: Marcello Fennel, PA-C   Critical care provider statement:    Critical care time (minutes):  30   Critical  care time was exclusive of:  Separately billable procedures and treating other patients   Critical care was time spent personally by me on the following activities:  Discussions with consultants, evaluation of patient's response to treatment, examination of patient, ordering and review of laboratory studies, ordering and review of radiographic studies, ordering and performing treatments and interventions, pulse oximetry, re-evaluation of patient's condition and review of old charts   I assumed direction of critical care for this patient from another provider in my specialty: no     Care discussed with: admitting provider      Medications Ordered in ED Medications  nitroGLYCERIN 50 mg in dextrose 5 % 250 mL (0.2 mg/mL) infusion (10 mcg/min Intravenous Rate/Dose Change 09/08/21 1326)  heparin bolus via infusion 4,000 Units (4,000 Units Intravenous Bolus from Bag 09/08/21 1320)    Followed by  heparin ADULT infusion 100 units/mL (25000 units/274mL) (850 Units/hr Intravenous New Bag/Given 09/08/21 1320)  aspirin chewable tablet 243 mg (243 mg Oral Given 09/08/21 1213)  alum & mag hydroxide-simeth (MAALOX/MYLANTA) 200-200-20 MG/5ML suspension 30 mL (30 mLs Oral Given 09/08/21 1213)  morphine 4 MG/ML injection 4 mg (4 mg Intravenous Given 09/08/21 1304)    ED Course/ Medical Decision Making/ A&P                           Medical Decision Making Amount and/or Complexity of Data Reviewed Labs: ordered. Radiology: ordered.  Risk OTC drugs. Prescription drug management.   This patient presents to the ED for concern of chest pain, this involves an extensive number of treatment options, and is a complaint that carries with it a high risk of complications and morbidity.  The differential diagnosis includes dissection, PE, ACS, GERD, pancreatitis    Additional history obtained:  Additional history obtained from electronic medical record, patient's wife at bedside External records from outside source  obtained and reviewed including previous oncology note please see HPI   Co morbidities that complicate the patient evaluation  Diabetes, hypertension, metastatic disease, immunocompromise  Social Determinants of Health:  N/A    Lab Tests:  I Ordered, and personally interpreted labs.  The pertinent results include: CBC  shows slight leukocytosis of 12.5 normocytic anemia hemoglobin of 10 appears to be his baseline CMP shows sodium 131 glucose 130 calcium 8.6 albumin 2.9 liver enzymes elevated 101 AST 83 ALT alk phos 381T bili 2.2 lipase is 32 D-dimer 1.77, first troponin is 17,000   Imaging Studies ordered:  I ordered imaging studies including chest x-ray I independently visualized and interpreted imaging which showed unremarkable I agree with the radiologist interpretation   Cardiac Monitoring:  The patient was maintained on a cardiac monitor.  I personally viewed and interpreted the cardiac monitored which showed an underlying rhythm of: EKG sinus rhythm does show deepening Q waves   Medicines ordered and prescription drug management:  I ordered medication including GI cocktail, aspirin for chest pain I have reviewed the patients home medicines and have made adjustments as needed  Critical Interventions:  Heparin, nitro drip, morphine treat for likely NSTEMI   Reevaluation:  It was noted patient had an abnormal EKG spoke with Dr. Doren Custard the attending recommends speaking with the STEMI doctor on-call  Noted that patient had a troponin of 17,000 and still has active chest pain 6 out of 10 despite GI cocktail, will start him on a nitro drip, heparin, provide him with morphine and consult with cardiology  Patient is reassessed resting comfortably vital signs remained stable he is in agreement with cardiology's plan.  Consultations Obtained:  Dr. Doren Custard spoke with Dr. Curly [place] STEMI doctor on-call who reviewed patient's EKG,  unclear etiology of chest pain recommend continue   work-up Spoke with Dr. Johney Frame who recommends hospital admission manage patient's active cancer and transfer to [facility] for catheterization Spoke with Dr. Joesph Fillers of the hospitalist team he will admit the patient will be transferred to [facility] via [place].    Rule out I have low suspicion for ACS, EKG is negative for signs of ischemia, he does have an elevated troponin likely patient suffering from an NSTEMI.  Low suspicion for PE presentation is a atypical of etiology he has an extremely elevated troponin which is likely cause of his pain, he does have an elevated D-dimer but this is possibly due to NSTEMI, active cancer, if the patient continues to have chest pain would recommend further evaluation.  Low suspicion for AAA or aortic dissection as history is atypical, patient has low risk factors.  Low suspicion for systemic infection as patient is nontoxic-appearing, vital signs reassuring, no obvious source infection noted on exam.     Dispostion and problem list  After consideration of the diagnostic results and the patients response to treatment, I feel that the patent would benefit from transfer to [facility] for catheterization  Chest pain-has elevated troponins likely suffering from an NSTEMI, patient will be admitted to the hospitalist team manage his active chemotherapy and will proceed to the Cath Lab.             Final Clinical Impression(s) / ED Diagnoses Final diagnoses:  NSTEMI (non-ST elevated myocardial infarction) [facility])    Rx / [place] Orders ED Discharge Orders     None         Marcello Fennel, PA-C 09/08/21 1333    Godfrey Pick, MD 09/10/21 [phone]

## 2021-09-08 NOTE — Interval H&P Note (Signed)
History and Physical Interval Note:  09/08/2021 3:30 PM  WYNN ALLDREDGE  has presented today for surgery, with the diagnosis of nstemi.  The various methods of treatment have been discussed with the patient and family. After consideration of risks, benefits and other options for treatment, the patient has consented to  Procedure(s): LEFT HEART CATH AND CORONARY ANGIOGRAPHY (N/A) as a surgical intervention.  The patient's history has been reviewed, patient examined, no change in status, stable for surgery.  I have reviewed the patient's chart and labs.  Questions were answered to the patient's satisfaction.    Cath Lab Visit (complete for each Cath Lab visit)  Clinical Evaluation Leading to the Procedure:   ACS: Yes.    Non-ACS:    Anginal Classification: CCS IV  Anti-ischemic medical therapy: Minimal Therapy (1 class of medications)  Non-Invasive Test Results: No non-invasive testing performed  Prior CABG: No previous CABG        Early [place]

## 2021-09-09 ENCOUNTER — Other Ambulatory Visit: Payer: Self-pay

## 2021-09-09 ENCOUNTER — Ambulatory Visit (HOSPITAL_COMMUNITY): Payer: Medicare Other

## 2021-09-09 ENCOUNTER — Other Ambulatory Visit (HOSPITAL_COMMUNITY): Payer: Medicare Other

## 2021-09-09 ENCOUNTER — Encounter (HOSPITAL_COMMUNITY): Payer: Self-pay | Admitting: Internal Medicine

## 2021-09-09 DIAGNOSIS — C221 Intrahepatic bile duct carcinoma: Secondary | ICD-10-CM

## 2021-09-09 DIAGNOSIS — I70213 Atherosclerosis of native arteries of extremities with intermittent claudication, bilateral legs: Secondary | ICD-10-CM

## 2021-09-09 DIAGNOSIS — F17200 Nicotine dependence, unspecified, uncomplicated: Secondary | ICD-10-CM | POA: Diagnosis not present

## 2021-09-09 DIAGNOSIS — I214 Non-ST elevation (NSTEMI) myocardial infarction: Secondary | ICD-10-CM | POA: Diagnosis not present

## 2021-09-09 LAB — LIPID PANEL
Cholesterol: 167 mg/dL (ref 0–200)
HDL: 27 mg/dL — ABNORMAL LOW (ref >40)
LDL Cholesterol: 123 mg/dL — ABNORMAL HIGH (ref 0–99)
Total CHOL/HDL Ratio: 6.2 RATIO
Triglycerides: 86 mg/dL (ref <150)
VLDL: 17 mg/dL (ref 0–40)

## 2021-09-09 LAB — CBC
HCT: 21.6 % — ABNORMAL LOW (ref 39.0–52.0)
HCT: 21.8 % — ABNORMAL LOW (ref 39.0–52.0)
HCT: 25.3 % — ABNORMAL LOW (ref 39.0–52.0)
Hemoglobin: 7.7 g/dL — ABNORMAL LOW (ref 13.0–17.0)
Hemoglobin: 7.5 g/dL — ABNORMAL LOW (ref 13.0–17.0)
Hemoglobin: 8.6 g/dL — ABNORMAL LOW (ref 13.0–17.0)
MCH: 33.6 pg (ref 26.0–34.0)
MCH: 32.9 pg (ref 26.0–34.0)
MCH: 32.1 pg (ref 26.0–34.0)
MCHC: 35.6 g/dL (ref 30.0–36.0)
MCHC: 34.4 g/dL (ref 30.0–36.0)
MCHC: 34 g/dL (ref 30.0–36.0)
MCV: 94.3 fL (ref 80.0–100.0)
MCV: 95.6 fL (ref 80.0–100.0)
MCV: 94.4 fL (ref 80.0–100.0)
Platelets: 138 10*3/uL — ABNORMAL LOW (ref 150–400)
Platelets: 123 10*3/uL — ABNORMAL LOW (ref 150–400)
Platelets: 116 10*3/uL — ABNORMAL LOW (ref 150–400)
RBC: 2.29 MIL/uL — ABNORMAL LOW (ref 4.22–5.81)
RBC: 2.28 MIL/uL — ABNORMAL LOW (ref 4.22–5.81)
RBC: 2.68 MIL/uL — ABNORMAL LOW (ref 4.22–5.81)
RDW: 16.3 % — ABNORMAL HIGH (ref 11.5–15.5)
RDW: 16.4 % — ABNORMAL HIGH (ref 11.5–15.5)
RDW: 18.6 % — ABNORMAL HIGH (ref 11.5–15.5)
WBC: 10.6 10*3/uL — ABNORMAL HIGH (ref 4.0–10.5)
WBC: 11.3 10*3/uL — ABNORMAL HIGH (ref 4.0–10.5)
WBC: 13.5 10*3/uL — ABNORMAL HIGH (ref 4.0–10.5)
nRBC: 0 % (ref 0.0–0.2)
nRBC: 0 % (ref 0.0–0.2)
nRBC: 0 % (ref 0.0–0.2)

## 2021-09-09 LAB — GLUCOSE, CAPILLARY
Glucose-Capillary: 109 mg/dL — ABNORMAL HIGH (ref 70–99)
Glucose-Capillary: 200 mg/dL — ABNORMAL HIGH (ref 70–99)
Glucose-Capillary: 119 mg/dL — ABNORMAL HIGH (ref 70–99)
Glucose-Capillary: 117 mg/dL — ABNORMAL HIGH (ref 70–99)
Glucose-Capillary: 106 mg/dL — ABNORMAL HIGH (ref 70–99)
Glucose-Capillary: 98 mg/dL (ref 70–99)

## 2021-09-09 LAB — ABO/RH: ABO/RH(D): O POS

## 2021-09-09 LAB — BASIC METABOLIC PANEL
Anion gap: 8 (ref 5–15)
BUN: 18 mg/dL (ref 8–23)
CO2: 20 mmol/L — ABNORMAL LOW (ref 22–32)
Calcium: 8.4 mg/dL — ABNORMAL LOW (ref 8.9–10.3)
Chloride: 105 mmol/L (ref 98–111)
Creatinine, Ser: 0.97 mg/dL (ref 0.61–1.24)
GFR, Estimated: >60 mL/min (ref >60)
Glucose, Bld: 98 mg/dL (ref 70–99)
Potassium: 4.2 mmol/L (ref 3.5–5.1)
Sodium: 133 mmol/L — ABNORMAL LOW (ref 135–145)

## 2021-09-09 LAB — PREPARE RBC (CROSSMATCH)

## 2021-09-09 LAB — MRSA NEXT GEN BY PCR, NASAL: MRSA by PCR Next Gen: NOT DETECTED

## 2021-09-09 LAB — HEPARIN LEVEL (UNFRACTIONATED): Heparin Unfractionated: <0.1 IU/mL — ABNORMAL LOW (ref 0.30–0.70)

## 2021-09-09 LAB — TSH: TSH: 0.317 u[IU]/mL — ABNORMAL LOW (ref 0.350–4.500)

## 2021-09-09 MED ORDER — SODIUM CHLORIDE 0.9% IV SOLUTION: Freq: Once | INTRAVENOUS | Status: AC

## 2021-09-09 MED ORDER — METOPROLOL SUCCINATE ER 25 MG PO TB24
25.0000 mg | ORAL_TABLET | Freq: Every day | ORAL | Status: DC
  Administered 2021-09-09: 25 mg via ORAL
  Filled 2021-09-09: qty 1

## 2021-09-09 MED ORDER — ATROPINE SULFATE 1 MG/10ML IJ SOSY
PREFILLED_SYRINGE | INTRAMUSCULAR | Status: AC
  Administered 2021-09-09: 1 mg
  Filled 2021-09-09: qty 10

## 2021-09-09 MED ORDER — CHLORHEXIDINE GLUCONATE CLOTH 2 % EX PADS
6.0000 | MEDICATED_PAD | Freq: Every day | CUTANEOUS | Status: DC
  Administered 2021-09-09 – 2021-09-11 (×3): 6 via TOPICAL

## 2021-09-09 NOTE — Progress Notes (Signed)
Patient was assisted to bathroom by RN and wife. Wife stayed in the bathroom with the patient. After patient turned from the toilet to the sink, he felt lightheaded. Wife called for assistance and nearby nurses assisted patient to a seated position on the bedside commode. After lightheadedness resolved, patient was assisted back to bed by RN, wife, and son. Night shift RN made aware.

## 2021-09-09 NOTE — Progress Notes (Signed)
Patients blood pressures have been systolic 11-46'W, MAP in the 60's. Nitroglycerin has been titrated down from 20 to 10, and Heparin gtt is at 850 units/hr. Hemoglobin has dropped from 10 to 7.7. Patient states chest pain is a 1/10. Paged Cardiology fellow at [place]. MD at bedside to assess patient. Ordered CBC to recheck. Continuing to monitor.

## 2021-09-09 NOTE — Progress Notes (Signed)
Progress Note  Patient Name: Jonathan Keith Date of Encounter: 09/09/2021  [facility] Cardiologist: None new. Dr Johney Frame  Subjective   Reports chest pain resolved completely at 5:30 am. Feels fine now. No chest pain or dyspnea. Denies any bleeding. Last BM the day before yesterday.   Inpatient Medications    Scheduled Meds:  aspirin  81 mg Oral Pre-Cath   [START ON 09/10/2021] aspirin  81 mg Oral Daily   atorvastatin  40 mg Oral q1800   Chlorhexidine Gluconate Cloth  6 each Topical Daily   clopidogrel  75 mg Oral Q breakfast   ferrous sulfate  325 mg Oral Q breakfast   insulin aspart  0-5 Units Subcutaneous QHS   insulin aspart  0-6 Units Subcutaneous TID WC   multivitamin with minerals  1 tablet Oral Daily   pantoprazole  40 mg Oral Daily   sodium chloride flush  3 mL Intravenous Q12H   sodium chloride flush  3 mL Intravenous Q12H   sodium chloride flush  3 mL Intravenous Q12H   sodium chloride flush  3 mL Intravenous Q12H   tamsulosin  0.4 mg Oral QHS   Continuous Infusions:  sodium chloride     sodium chloride     sodium chloride     sodium chloride 1 mL/kg/hr (09/09/21 0015)   heparin 850 Units/hr (09/09/21 0600)   nitroGLYCERIN 10 mcg/min (09/09/21 0600)   PRN Meds: sodium chloride, sodium chloride, sodium chloride, acetaminophen **OR** acetaminophen, bisacodyl, hydrALAZINE, ondansetron **OR** ondansetron (ZOFRAN) IV, ondansetron (ZOFRAN) IV, polyethylene glycol, sodium chloride flush, sodium chloride flush, sodium chloride flush, traZODone   Vital Signs    Vitals:   09/09/21 0645 09/09/21 0700 09/09/21 0759 09/09/21 0800  BP: (!) 93/55 (!) 104/57  96/62  Pulse: 74 98  79  Resp: 16 18  19   Temp:   97.6 F (36.4 C)   TempSrc:   Oral   SpO2: 96% 96%  95%  Weight:      Height:        Intake/Output Summary (Last 24 hours) at 09/09/2021 0932 Last data filed at 09/09/2021 0600 Gross per 24 hour  Intake 288.75 ml  Output --  Net 288.75 ml   Last 3  Weights 09/09/2021 09/08/2021 09/08/2021  Weight (lbs) 157 lb 10.1 oz 159 lb 2.8 oz 154 lb  Weight (kg) 71.5 kg 72.2 kg 69.854 kg      Telemetry    NSR - Personally Reviewed  ECG    NSR with mild ST elevation inferior and anterior - Personally Reviewed  Physical Exam   GEN: pale chronically ill appearing WM in No acute distress.   Neck: No JVD Cardiac: RRR, no murmurs, rubs, or gallops. Port a cath in place Respiratory: Clear to auscultation bilaterally. GI: Soft, nontender, non-distended  MS: No edema; No deformity. No radial site hematoma. Neuro:  Nonfocal  Psych: Normal affect   Labs    High Sensitivity Troponin:   Recent Labs  Lab 09/08/21 1219 09/08/21 1419  TROPONINIHS 17,119* 14,964*     Chemistry Recent Labs  Lab 09/08/21 1219 09/09/21 0111  NA 131* 133*  K 4.6 4.2  CL 102 105  CO2 23 20*  GLUCOSE 130* 98  BUN 18 18  CREATININE 0.85 0.97  CALCIUM 8.6* 8.4*  PROT 7.0  --   ALBUMIN 2.9*  --   AST 101*  --   ALT 83*  --   ALKPHOS 381*  --   BILITOT 2.2*  --  GFRNONAA >60 >60  ANIONGAP 6 8    Lipids  Recent Labs  Lab 09/09/21 0111  CHOL 167  TRIG 86  HDL 27*  LDLCALC 123*  CHOLHDL 6.2    Hematology Recent Labs  Lab 09/08/21 1219 09/09/21 0111 09/09/21 0550  WBC 12.5* 10.6* 11.3*  RBC 2.95* 2.29* 2.28*  HGB 10.0* 7.7* 7.5*  HCT 29.3* 21.6* 21.8*  MCV 99.3 94.3 95.6  MCH 33.9 33.6 32.9  MCHC 34.1 35.6 34.4  RDW 16.7* 16.3* 16.4*  PLT 182 138* 123*   Thyroid No results for input(s): TSH, FREET4 in the last 168 hours.  BNPNo results for input(s): BNP, PROBNP in the last 168 hours.  DDimer  Recent Labs  Lab 09/08/21 1219  DDIMER 1.77*     Radiology    DG Chest 2 View  Result Date: 09/08/2021 CLINICAL DATA:  Provided history: Chest pain. Additional history provided: Patient reports history of bile duct cancer currently on chemotherapy. EXAM: CHEST - 2 VIEW COMPARISON:  Prior chest radiographs 05/25/2021 and earlier. Chest CT  03/12/2021. FINDINGS: A right chest infusion port catheter is present with tip projecting at the level of the lower SVC. Heart size within normal limits. No appreciable airspace consolidation or pulmonary edema. No evidence of pleural effusion or pneumothorax. No acute bony abnormality identified. Degenerative changes of the spine. Redemonstrated metallic coils within the right upper abdomen. IMPRESSION: No evidence of acute cardiopulmonary abnormality. Electronically Signed   By: Kellie Simmering D.O.   On: 09/08/2021 12:21   CT Angio Chest PE W and/or Wo Contrast  Result Date: 09/08/2021 CLINICAL DATA:  Chest pain last night with elevated D-dimer levels. Concern for pulmonary embolism. History of metastatic Linda carcinoma, hypertension and diabetes. EXAM: CT ANGIOGRAPHY CHEST WITH CONTRAST TECHNIQUE: Multidetector CT imaging of the chest was performed using the standard protocol during bolus administration of intravenous contrast. Multiplanar CT image reconstructions and MIPs were obtained to evaluate the vascular anatomy. RADIATION DOSE REDUCTION: This exam was performed according to the departmental dose-optimization program which includes automated exposure control, adjustment of the mA and/or kV according to patient size and/or use of iterative reconstruction technique. CONTRAST:  1mL OMNIPAQUE IOHEXOL 350 MG/ML SOLN COMPARISON:  Chest CT 03/12/2021. Abdominal CT 05/25/2021. FINDINGS: Cardiovascular: The pulmonary arteries are well opacified with contrast to the level of the subsegmental branches. There is no evidence of acute pulmonary embolism. Right IJ Port-A-Cath extends to the upper right atrium. Extensive coronary artery atherosclerosis and intimal irregularity of the thoracic aorta are noted. No acute systemic vascular findings are seen. The heart size is normal. There is no pericardial effusion. Mediastinum/Nodes: There are no enlarged mediastinal, hilar or axillary lymph nodes.Small mediastinal  lymph nodes are unchanged. The thyroid gland, trachea and esophagus demonstrate no significant findings. Lungs/Pleura: No pleural effusion or pneumothorax. Mild diffuse central airway thickening with scattered linear scarring in the right lower lobe and lingula. No suspicious pulmonary nodules. Probable dependent secretions within the right mainstem bronchus. Upper abdomen: The visualized upper abdomen demonstrates no acute findings. There is intrahepatic biliary dilatation which appears similar to the prior study. No visualized pneumobilia within the superior aspect of the liver. Musculoskeletal/Chest wall: There is no chest wall mass or suspicious osseous finding. Thoracic spondylosis. Review of the MIP images confirms the above findings. IMPRESSION: 1. No evidence of acute pulmonary embolism or other acute chest findings. 2. Coronary and Aortic Atherosclerosis (ICD10-I70.0). 3. Central airway thickening with probable dependent secretions in the right mainstem bronchus. 4. No evidence  of thoracic metastatic disease. 5. Grossly stable intrahepatic biliary dilatation, incompletely visualized. Electronically Signed   By: Richardean Sale M.D.   On: 09/08/2021 13:56   CARDIAC CATHETERIZATION  Result Date: 09/08/2021   Prox RCA lesion is 80% stenosed.   Mid LAD lesion is 60% stenosed.   Dist LAD-1 lesion is 99% stenosed.   Dist LAD-2 lesion is 99% stenosed.   Ramus lesion is 90% stenosed.   2nd Mrg lesion is 80% stenosed.   LV end diastolic pressure is normal.   The left ventricular ejection fraction is 45-50% by visual estimate. 1.  Moderate to severe diffuse disease with subtotally occluded serial distal LAD lesions.  Given the burden of disease and the patient's comorbidities medical management will be pursued.  The results were reviewed with the team C attending Dr. Martinique. 2.  Ejection fraction approximately 40 to 45% with LVEDP of 14 mmHg. Recommendation: Medical management for acute coronary syndrome.     Cardiac Studies   LEFT HEART CATH AND CORONARY ANGIOGRAPHY   Conclusion      Prox RCA lesion is 80% stenosed.   Mid LAD lesion is 60% stenosed.   Dist LAD-1 lesion is 99% stenosed.   Dist LAD-2 lesion is 99% stenosed.   Ramus lesion is 90% stenosed.   2nd Mrg lesion is 80% stenosed.   LV end diastolic pressure is normal.   The left ventricular ejection fraction is 45-50% by visual estimate.   1.  Moderate to severe diffuse disease with subtotally occluded serial distal LAD lesions.  Given the burden of disease and the patient's comorbidities medical management will be pursued.  The results were reviewed with the team C attending Dr. Martinique. 2.  Ejection fraction approximately 40 to 45% with LVEDP of 14 mmHg.  Recommendation: Medical management for acute coronary syndrome. Diagnostic Dominance: Right Intervention   Echo 05/27/21: IMPRESSIONS     1. Left ventricular ejection fraction, by estimation, is 55 to 60%. The  left ventricle has normal function. The left ventricle has no regional  wall motion abnormalities. Left ventricular diastolic parameters were  normal.   2. Right ventricular systolic function is normal. The right ventricular  size is normal. Tricuspid regurgitation signal is inadequate for assessing  PA pressure.   3. The mitral valve is grossly normal. Trivial mitral valve  regurgitation.   4. The aortic valve is tricuspid. There is mild calcification of the  aortic valve. Aortic valve regurgitation is not visualized.   5. The inferior vena cava is normal in size with greater than 50%  respiratory variability, suggesting right atrial pressure of 3 mmHg.   6. No obvious valvular vegetations.   Comparison(s): No prior Echocardiogram.   Patient Profile     66 y.o. male with a history of metastatic cholangiocarcinoma with a life expectancy of 12 to 18 months who presented to [facility] with 24 hours worth of chest pain.  EKG was not diagnostic for  ST elevation myocardial infarction however his troponin was elevated consistent with an acute coronary syndrome.   Assessment & Plan    NSTEMI. Severe 3 vessel disease on cardiac cath. Culprit lesion is distal LAD occlusion. Anatomy is not suitable for PCI. Not a candidate for CABG due to poor prognosis from cholangiocarcinoma. Will manage medically. Try and wean off IV Ntg. Will DC IV heparin given concern for bleeding with low Hgb. Start low dose beta blocker. On ASA and Plavix as well as statin Metastatic cholangiocarcinoma. Poor prognosis. On  chemo. HLD LDL 123. Now on statin Severe anemia. Etiology unclear. No clear source of bleeding. Will hold IV heparin. Transfuse 1 unit PRBCs given severe ischemic heart disease.  Tobacco abuse. Encouraged cessation. DM metformin on hold.      For questions or updates, please contact [place] Please consult [email or web address] for contact info under        Signed, Aljean Horiuchi [place], MD  09/09/2021, 9:32 AM

## 2021-09-10 ENCOUNTER — Ambulatory Visit (HOSPITAL_COMMUNITY): Payer: Medicare Other

## 2021-09-10 DIAGNOSIS — F17200 Nicotine dependence, unspecified, uncomplicated: Secondary | ICD-10-CM | POA: Diagnosis not present

## 2021-09-10 DIAGNOSIS — C221 Intrahepatic bile duct carcinoma: Secondary | ICD-10-CM | POA: Diagnosis not present

## 2021-09-10 DIAGNOSIS — I214 Non-ST elevation (NSTEMI) myocardial infarction: Secondary | ICD-10-CM | POA: Diagnosis not present

## 2021-09-10 DIAGNOSIS — I70213 Atherosclerosis of native arteries of extremities with intermittent claudication, bilateral legs: Secondary | ICD-10-CM | POA: Diagnosis not present

## 2021-09-10 LAB — BASIC METABOLIC PANEL
Anion gap: 5 (ref 5–15)
BUN: 18 mg/dL (ref 8–23)
CO2: 20 mmol/L — ABNORMAL LOW (ref 22–32)
Calcium: 7.8 mg/dL — ABNORMAL LOW (ref 8.9–10.3)
Chloride: 108 mmol/L (ref 98–111)
Creatinine, Ser: 1 mg/dL (ref 0.61–1.24)
GFR, Estimated: >60 mL/min (ref >60)
Glucose, Bld: 94 mg/dL (ref 70–99)
Potassium: 3.8 mmol/L (ref 3.5–5.1)
Sodium: 133 mmol/L — ABNORMAL LOW (ref 135–145)

## 2021-09-10 LAB — CBC
HCT: 27.7 % — ABNORMAL LOW (ref 39.0–52.0)
Hemoglobin: 9.1 g/dL — ABNORMAL LOW (ref 13.0–17.0)
MCH: 31 pg (ref 26.0–34.0)
MCHC: 32.9 g/dL (ref 30.0–36.0)
MCV: 94.2 fL (ref 80.0–100.0)
Platelets: 123 10*3/uL — ABNORMAL LOW (ref 150–400)
RBC: 2.94 MIL/uL — ABNORMAL LOW (ref 4.22–5.81)
RDW: 18.7 % — ABNORMAL HIGH (ref 11.5–15.5)
WBC: 16.4 10*3/uL — ABNORMAL HIGH (ref 4.0–10.5)
nRBC: 0 % (ref 0.0–0.2)

## 2021-09-10 LAB — TYPE AND SCREEN
ABO/RH(D): O POS
Antibody Screen: NEGATIVE
Unit division: 0

## 2021-09-10 LAB — GLUCOSE, CAPILLARY
Glucose-Capillary: 145 mg/dL — ABNORMAL HIGH (ref 70–99)
Glucose-Capillary: 154 mg/dL — ABNORMAL HIGH (ref 70–99)
Glucose-Capillary: 158 mg/dL — ABNORMAL HIGH (ref 70–99)
Glucose-Capillary: 166 mg/dL — ABNORMAL HIGH (ref 70–99)

## 2021-09-10 LAB — BPAM RBC
Blood Product Expiration Date: 202302252359
ISSUE DATE / TIME: 202301311238
Unit Type and Rh: 5100

## 2021-09-10 LAB — MAGNESIUM: Magnesium: 1.7 mg/dL (ref 1.7–2.4)

## 2021-09-10 MED ORDER — SODIUM CHLORIDE 0.9 % IV BOLUS
300.0000 mL | Freq: Once | INTRAVENOUS | Status: AC
  Administered 2021-09-10: 300 mL via INTRAVENOUS

## 2021-09-10 MED ORDER — DOPAMINE-DEXTROSE 3.2-5 MG/ML-% IV SOLN: 0.0000 ug/kg/min | INTRAVENOUS | Status: DC

## 2021-09-10 MED ORDER — DOPAMINE-DEXTROSE 3.2-5 MG/ML-% IV SOLN
0.0000 ug/kg/min | INTRAVENOUS | Status: DC
  Administered 2021-09-10: 4 ug/kg/min via INTRAVENOUS

## 2021-09-10 MED ORDER — PROCHLORPERAZINE MALEATE 10 MG PO TABS
10.0000 mg | ORAL_TABLET | Freq: Three times a day (TID) | ORAL | Status: DC
  Administered 2021-09-10 (×2): 10 mg via ORAL
  Filled 2021-09-10 (×5): qty 1

## 2021-09-10 MED ORDER — MAGNESIUM OXIDE -MG SUPPLEMENT 400 (240 MG) MG PO TABS
400.0000 mg | ORAL_TABLET | Freq: Once | ORAL | Status: AC
  Administered 2021-09-10: 400 mg via ORAL
  Filled 2021-09-10: qty 1

## 2021-09-10 MED ORDER — MAGNESIUM SULFATE 2 GM/50ML IV SOLN
2.0000 g | Freq: Once | INTRAVENOUS | Status: AC
  Administered 2021-09-10: 2 g via INTRAVENOUS
  Filled 2021-09-10: qty 50

## 2021-09-10 MED ORDER — DOCUSATE SODIUM 100 MG PO CAPS
100.0000 mg | ORAL_CAPSULE | Freq: Every day | ORAL | Status: DC
  Administered 2021-09-10 – 2021-09-11 (×2): 100 mg via ORAL
  Filled 2021-09-10 (×2): qty 1

## 2021-09-10 MED ORDER — ENOXAPARIN SODIUM 40 MG/0.4ML IJ SOSY
40.0000 mg | PREFILLED_SYRINGE | INTRAMUSCULAR | Status: DC
  Administered 2021-09-10 – 2021-09-12 (×3): 40 mg via SUBCUTANEOUS
  Filled 2021-09-10 (×3): qty 0.4

## 2021-09-10 MED ORDER — SODIUM CHLORIDE 0.9 % IV SOLN: INTRAVENOUS | Status: DC

## 2021-09-10 MED ORDER — SODIUM CHLORIDE 0.9 % IV SOLN
500.0000 mL | Freq: Once | INTRAVENOUS | Status: AC
  Administered 2021-09-10: 500 mL via INTRAVENOUS

## 2021-09-10 MED ORDER — DOPAMINE-DEXTROSE 3.2-5 MG/ML-% IV SOLN
INTRAVENOUS | Status: AC
  Administered 2021-09-10: 5 ug/kg/min via INTRAVENOUS
  Filled 2021-09-10: qty 250

## 2021-09-10 NOTE — Progress Notes (Signed)
Pt had a pause in heart rhythm, lasting 4.6 secs at 2326. RN paged Cards fellow and pulled zoll pads from Pyxis to place on patient. Pt went from NSR to A. Fib with slow rate (40s bpm) and hypotensive (MAP 50s) at 2341. Pt symptomatic including confusion and diaphoretic. RN on the phone with Cards fellow as it happened. Zoll pads were placed. 1 amp of Atropine was given. Cards fellow came to bedside. Verbal order to start Dopamine gtt with a MAP goal >89 and systolic BP >84. BMP/Mag labs sent, now pending. Pt on zoll pacing mode at backup rate of 40 bpm.    Since then, patient has been in and out of A. Fib, went into A. Flutter at 0139 for 1-2 mins then back to A. Fib. Pt now in NSR with HR 70s-100).

## 2021-09-10 NOTE — Significant Event (Addendum)
Notified by Apolonio Schneiders, RN for symptomatic bradycardia.   Mr. Basaldua has a history of metastatic cholangiocarcinoma on chemo, admitted for NSTEMI with severe multivessel disease, being medically managed. Received first dose of metoprolol this AM.   Tonight he began having PACs followed by brief AF associated with bradycardia/pauses up to about 4 seconds. In sinus rhythm rates normal. This progressed to more persistent AF associated with symptomatic bradycardia with ventricular rates in the 40s and significant hypotension. He was given atropine by RN with no response. On my arrival he appeared relatively well despite persistent hypotension with MAP in low 50s. Rapid fluid bolus infusing.   Started dopamine 79mcg/kg/min with good effect. HR improved to 60s and BP normalized - made titratable with BP goal. BMP/Mg sent - Mg 1.7, will replete. NPO for potential DCCV if needed. He can discuss anticoagulation with primary team in the morning given metastatic cancer diagnosis. Given poor tolerance of AF with bradycardic response and significant CAD, dofetilide may be a reasonable option for rhythm control long-term, but will defer to EP.

## 2021-09-10 NOTE — Progress Notes (Addendum)
Progress Note  Patient Name: HAMED DEBELLA Date of Encounter: 09/10/2021  [facility] Cardiologist: None new. Dr Johney Frame  Subjective   Patient reports poor appetite. Was taking compazine prior to meals at home with improvement. No BM yet. Arrhythmia noted last  night  with Afib/flutter with slow response and 4.6 second pause. Converted to NSR. On Dopamine now.   Inpatient Medications    Scheduled Meds:  aspirin  81 mg Oral Daily   atorvastatin  40 mg Oral q1800   Chlorhexidine Gluconate Cloth  6 each Topical Daily   clopidogrel  75 mg Oral Q breakfast   ferrous sulfate  325 mg Oral Q breakfast   insulin aspart  0-5 Units Subcutaneous QHS   insulin aspart  0-6 Units Subcutaneous TID WC   multivitamin with minerals  1 tablet Oral Daily   pantoprazole  40 mg Oral Daily   sodium chloride flush  3 mL Intravenous Q12H   sodium chloride flush  3 mL Intravenous Q12H   sodium chloride flush  3 mL Intravenous Q12H   sodium chloride flush  3 mL Intravenous Q12H   tamsulosin  0.4 mg Oral QHS   Continuous Infusions:  sodium chloride     sodium chloride     sodium chloride     sodium chloride 10 mL/hr at 09/10/21 0631   sodium chloride 1 mL/kg/hr (09/09/21 0015)   DOPamine 5 mcg/kg/min (09/10/21 0600)   PRN Meds: sodium chloride, sodium chloride, sodium chloride, acetaminophen **OR** acetaminophen, bisacodyl, ondansetron **OR** ondansetron (ZOFRAN) IV, ondansetron (ZOFRAN) IV, polyethylene glycol, sodium chloride flush, sodium chloride flush, sodium chloride flush, traZODone   Vital Signs    Vitals:   09/10/21 0610 09/10/21 0620 09/10/21 0630 09/10/21 0753  BP: 125/64 115/65 109/71   Pulse: 66 64 62   Resp: 18 15 17    Temp:    97.9 F (36.6 C)  TempSrc:    Oral  SpO2: 94% 95% 95%   Weight:      Height:        Intake/Output Summary (Last 24 hours) at 09/10/2021 0840 Last data filed at 09/10/2021 0600 Gross per 24 hour  Intake 1324.07 ml  Output --  Net 1324.07 ml     Last 3 Weights 09/10/2021 09/09/2021 09/08/2021  Weight (lbs) 158 lb 11.7 oz 157 lb 10.1 oz 159 lb 2.8 oz  Weight (kg) 72 kg 71.5 kg 72.2 kg      Telemetry    NSR - Personally Reviewed  ECG    NSR with mild ST elevation inferior and anterior - Personally Reviewed  Physical Exam   GEN: pale chronically ill appearing WM in No acute distress.   Neck: No JVD Cardiac: RRR, no murmurs, rubs, or gallops. Port a cath in place Respiratory: Clear to auscultation bilaterally. GI: Soft, nontender, non-distended  MS: No edema; No deformity. No radial site hematoma. Neuro:  Nonfocal  Psych: Normal affect   Labs    High Sensitivity Troponin:   Recent Labs  Lab 09/08/21 1219 09/08/21 1419  TROPONINIHS 17,119* 14,964*      Chemistry Recent Labs  Lab 09/08/21 1219 09/09/21 0111 09/10/21 0004  NA 131* 133* 133*  K 4.6 4.2 3.8  CL 102 105 108  CO2 23 20* 20*  GLUCOSE 130* 98 94  BUN 18 18 18   CREATININE 0.85 0.97 1.00  CALCIUM 8.6* 8.4* 7.8*  MG  --   --  1.7  PROT 7.0  --   --  ALBUMIN 2.9*  --   --   AST 101*  --   --   ALT 83*  --   --   ALKPHOS 381*  --   --   BILITOT 2.2*  --   --   GFRNONAA >60 >60 >60  ANIONGAP 6 8 5      Lipids  Recent Labs  Lab 09/09/21 0111  CHOL 167  TRIG 86  HDL 27*  LDLCALC 123*  CHOLHDL 6.2     Hematology Recent Labs  Lab 09/09/21 0550 09/09/21 1630 09/10/21 0210  WBC 11.3* 13.5* 16.4*  RBC 2.28* 2.68* 2.94*  HGB 7.5* 8.6* 9.1*  HCT 21.8* 25.3* 27.7*  MCV 95.6 94.4 94.2  MCH 32.9 32.1 31.0  MCHC 34.4 34.0 32.9  RDW 16.4* 18.6* 18.7*  PLT 123* 116* 123*    Thyroid  Recent Labs  Lab 09/09/21 0111  TSH 0.317*     BNPNo results for input(s): BNP, PROBNP in the last 168 hours.  DDimer  Recent Labs  Lab 09/08/21 1219  DDIMER 1.77*      Radiology    DG Chest 2 View  Result Date: 09/08/2021 CLINICAL DATA:  Provided history: Chest pain. Additional history provided: Patient reports history of bile duct  cancer currently on chemotherapy. EXAM: CHEST - 2 VIEW COMPARISON:  Prior chest radiographs 05/25/2021 and earlier. Chest CT 03/12/2021. FINDINGS: A right chest infusion port catheter is present with tip projecting at the level of the lower SVC. Heart size within normal limits. No appreciable airspace consolidation or pulmonary edema. No evidence of pleural effusion or pneumothorax. No acute bony abnormality identified. Degenerative changes of the spine. Redemonstrated metallic coils within the right upper abdomen. IMPRESSION: No evidence of acute cardiopulmonary abnormality. Electronically Signed   By: Kellie Simmering D.O.   On: 09/08/2021 12:21   CT Angio Chest PE W and/or Wo Contrast  Result Date: 09/08/2021 CLINICAL DATA:  Chest pain last night with elevated D-dimer levels. Concern for pulmonary embolism. History of metastatic Linda carcinoma, hypertension and diabetes. EXAM: CT ANGIOGRAPHY CHEST WITH CONTRAST TECHNIQUE: Multidetector CT imaging of the chest was performed using the standard protocol during bolus administration of intravenous contrast. Multiplanar CT image reconstructions and MIPs were obtained to evaluate the vascular anatomy. RADIATION DOSE REDUCTION: This exam was performed according to the departmental dose-optimization program which includes automated exposure control, adjustment of the mA and/or kV according to patient size and/or use of iterative reconstruction technique. CONTRAST:  19mL OMNIPAQUE IOHEXOL 350 MG/ML SOLN COMPARISON:  Chest CT 03/12/2021. Abdominal CT 05/25/2021. FINDINGS: Cardiovascular: The pulmonary arteries are well opacified with contrast to the level of the subsegmental branches. There is no evidence of acute pulmonary embolism. Right IJ Port-A-Cath extends to the upper right atrium. Extensive coronary artery atherosclerosis and intimal irregularity of the thoracic aorta are noted. No acute systemic vascular findings are seen. The heart size is normal. There is no  pericardial effusion. Mediastinum/Nodes: There are no enlarged mediastinal, hilar or axillary lymph nodes.Small mediastinal lymph nodes are unchanged. The thyroid gland, trachea and esophagus demonstrate no significant findings. Lungs/Pleura: No pleural effusion or pneumothorax. Mild diffuse central airway thickening with scattered linear scarring in the right lower lobe and lingula. No suspicious pulmonary nodules. Probable dependent secretions within the right mainstem bronchus. Upper abdomen: The visualized upper abdomen demonstrates no acute findings. There is intrahepatic biliary dilatation which appears similar to the prior study. No visualized pneumobilia within the superior aspect of the liver. Musculoskeletal/Chest wall: There is no  chest wall mass or suspicious osseous finding. Thoracic spondylosis. Review of the MIP images confirms the above findings. IMPRESSION: 1. No evidence of acute pulmonary embolism or other acute chest findings. 2. Coronary and Aortic Atherosclerosis (ICD10-I70.0). 3. Central airway thickening with probable dependent secretions in the right mainstem bronchus. 4. No evidence of thoracic metastatic disease. 5. Grossly stable intrahepatic biliary dilatation, incompletely visualized. Electronically Signed   By: Richardean Sale M.D.   On: 09/08/2021 13:56   CARDIAC CATHETERIZATION  Result Date: 09/08/2021   Prox RCA lesion is 80% stenosed.   Mid LAD lesion is 60% stenosed.   Dist LAD-1 lesion is 99% stenosed.   Dist LAD-2 lesion is 99% stenosed.   Ramus lesion is 90% stenosed.   2nd Mrg lesion is 80% stenosed.   LV end diastolic pressure is normal.   The left ventricular ejection fraction is 45-50% by visual estimate. 1.  Moderate to severe diffuse disease with subtotally occluded serial distal LAD lesions.  Given the burden of disease and the patient's comorbidities medical management will be pursued.  The results were reviewed with the team C attending Dr. Martinique. 2.  Ejection  fraction approximately 40 to 45% with LVEDP of 14 mmHg. Recommendation: Medical management for acute coronary syndrome.    Cardiac Studies   LEFT HEART CATH AND CORONARY ANGIOGRAPHY   Conclusion      Prox RCA lesion is 80% stenosed.   Mid LAD lesion is 60% stenosed.   Dist LAD-1 lesion is 99% stenosed.   Dist LAD-2 lesion is 99% stenosed.   Ramus lesion is 90% stenosed.   2nd Mrg lesion is 80% stenosed.   LV end diastolic pressure is normal.   The left ventricular ejection fraction is 45-50% by visual estimate.   1.  Moderate to severe diffuse disease with subtotally occluded serial distal LAD lesions.  Given the burden of disease and the patient's comorbidities medical management will be pursued.  The results were reviewed with the team C attending Dr. Martinique. 2.  Ejection fraction approximately 40 to 45% with LVEDP of 14 mmHg.  Recommendation: Medical management for acute coronary syndrome. Diagnostic Dominance: Right Intervention   Echo 05/27/21: IMPRESSIONS     1. Left ventricular ejection fraction, by estimation, is 55 to 60%. The  left ventricle has normal function. The left ventricle has no regional  wall motion abnormalities. Left ventricular diastolic parameters were  normal.   2. Right ventricular systolic function is normal. The right ventricular  size is normal. Tricuspid regurgitation signal is inadequate for assessing  PA pressure.   3. The mitral valve is grossly normal. Trivial mitral valve  regurgitation.   4. The aortic valve is tricuspid. There is mild calcification of the  aortic valve. Aortic valve regurgitation is not visualized.   5. The inferior vena cava is normal in size with greater than 50%  respiratory variability, suggesting right atrial pressure of 3 mmHg.   6. No obvious valvular vegetations.   Comparison(s): No prior Echocardiogram.   Patient Profile     66 y.o. male with a history of metastatic cholangiocarcinoma with a life expectancy  of 12 to 5 months who presented to [facility] with 24 hours worth of chest pain.  EKG was not diagnostic for ST elevation myocardial infarction however his troponin was elevated consistent with an acute coronary syndrome.   Assessment & Plan    NSTEMI. Severe 3 vessel disease on cardiac cath. Culprit lesion is distal LAD occlusion. Anatomy is  not suitable for PCI. Not a candidate for CABG due to poor prognosis from cholangiocarcinoma. Will manage medically. No recurrent angina. Unable to tolerate beta blocker with hypotension and bradycardia. On DAPT with ASA and Plavix.   Afib/flutter with slow VR. Later converted to NSR with Wenkebach block. Now in NSR. Will hold beta blocker. DC dopamine today and monitor.  Metastatic cholangiocarcinoma. Poor prognosis. On chemo. HLD LDL 123. Now on statin Severe anemia. Etiology unclear. No clear source of bleeding. Heparin stopped. Transfuse 1 unit PRBCs yesterday with improvement in Hgb from 7.5 to 9.1.   Tobacco abuse. Encouraged cessation. DM metformin on hold.  Constipation. Add stool softener.      For questions or updates, please contact [place] Please consult [email or web address] for contact info under    Addendum: after patient seen on rounds nursing got him up to bathroom. Patient became unresponsive. BP dropped to 77 systolic. Helped back to bed and became responsive again. BP improved. Only after episode he had second degree AV block- Wenkebach but this was not the precipitating issue. Will give IV fluid bolus and infusion. Continue Dopamine for now- may wean later if improves.     Signed, Janell Keeling [place], MD  09/10/2021, 8:40 AM

## 2021-09-10 NOTE — Progress Notes (Signed)
Pt went into Afib with slow ventricular rate (40s bpm) at 2341 while RN at bedside. Pt became acutely hypotensive (MAP 50s) and symptomatic (pale, diaphoretic, HA, lethargic). Given 1 amp atropine, no response. Zoll pads applied, NS bolus started. Pt's s/sx improved, more responsive as BP improved. HR still in 59s. Cards fellow notified, came to bedside. Per MD, started pt on Dopamine gtt to keep MAP>65. BMet/Mag sent, pending.  MD said he'd return to bedside within the hour to see how pt is tolerating interventions.

## 2021-09-11 ENCOUNTER — Ambulatory Visit (HOSPITAL_COMMUNITY): Payer: Medicare Other

## 2021-09-11 LAB — CBC
HCT: 25.3 % — ABNORMAL LOW (ref 39.0–52.0)
Hemoglobin: 8.5 g/dL — ABNORMAL LOW (ref 13.0–17.0)
MCH: 31.4 pg (ref 26.0–34.0)
MCHC: 33.6 g/dL (ref 30.0–36.0)
MCV: 93.4 fL (ref 80.0–100.0)
Platelets: 124 10*3/uL — ABNORMAL LOW (ref 150–400)
RBC: 2.71 MIL/uL — ABNORMAL LOW (ref 4.22–5.81)
RDW: 18.6 % — ABNORMAL HIGH (ref 11.5–15.5)
WBC: 19.1 10*3/uL — ABNORMAL HIGH (ref 4.0–10.5)
nRBC: 0 % (ref 0.0–0.2)

## 2021-09-11 LAB — GLUCOSE, CAPILLARY
Glucose-Capillary: 120 mg/dL — ABNORMAL HIGH (ref 70–99)
Glucose-Capillary: 174 mg/dL — ABNORMAL HIGH (ref 70–99)
Glucose-Capillary: 90 mg/dL (ref 70–99)
Glucose-Capillary: 104 mg/dL — ABNORMAL HIGH (ref 70–99)

## 2021-09-11 MED ORDER — PROCHLORPERAZINE MALEATE 10 MG PO TABS
10.0000 mg | ORAL_TABLET | Freq: Three times a day (TID) | ORAL | Status: DC | PRN
  Filled 2021-09-11: qty 1

## 2021-09-11 MED ORDER — MIDODRINE HCL 5 MG PO TABS
5.0000 mg | ORAL_TABLET | Freq: Three times a day (TID) | ORAL | Status: DC
  Administered 2021-09-11 – 2021-09-12 (×3): 5 mg via ORAL
  Filled 2021-09-11 (×3): qty 1

## 2021-09-11 NOTE — Progress Notes (Signed)
Progress Note  Patient Name: Jonathan Keith Date of Encounter: 09/11/2021  [facility] Cardiologist: None new. Dr Johney Frame  Subjective   Doing better today. Still on low dose Dopamine. Up in chair more without syncope.   Inpatient Medications    Scheduled Meds:  aspirin  81 mg Oral Daily   atorvastatin  40 mg Oral q1800   Chlorhexidine Gluconate Cloth  6 each Topical Daily   clopidogrel  75 mg Oral Q breakfast   docusate sodium  100 mg Oral Daily   enoxaparin (LOVENOX) injection  40 mg Subcutaneous Q24H   ferrous sulfate  325 mg Oral Q breakfast   insulin aspart  0-5 Units Subcutaneous QHS   insulin aspart  0-6 Units Subcutaneous TID WC   multivitamin with minerals  1 tablet Oral Daily   pantoprazole  40 mg Oral Daily   sodium chloride flush  3 mL Intravenous Q12H   sodium chloride flush  3 mL Intravenous Q12H   sodium chloride flush  3 mL Intravenous Q12H   sodium chloride flush  3 mL Intravenous Q12H   Continuous Infusions:  sodium chloride     sodium chloride     sodium chloride     sodium chloride 300 mL/hr at 09/10/21 0900   sodium chloride 1 mL/kg/hr (09/09/21 0015)   DOPamine 4 mcg/kg/min (09/11/21 0500)   PRN Meds: sodium chloride, sodium chloride, sodium chloride, acetaminophen **OR** acetaminophen, bisacodyl, ondansetron **OR** ondansetron (ZOFRAN) IV, ondansetron (ZOFRAN) IV, polyethylene glycol, prochlorperazine, sodium chloride flush, sodium chloride flush, sodium chloride flush, traZODone   Vital Signs    Vitals:   09/11/21 0440 09/11/21 0445 09/11/21 0450 09/11/21 0500  BP: 115/62  115/60 (!) 131/58  Pulse: (!) 56 60 (!) 52 (!) 54  Resp: 17 20 15 17   Temp:      TempSrc:      SpO2: 90% 96% 97% 94%  Weight:      Height:        Intake/Output Summary (Last 24 hours) at 09/11/2021 0832 Last data filed at 09/11/2021 0500 Gross per 24 hour  Intake 2226.24 ml  Output 550 ml  Net 1676.24 ml    Last 3 Weights 09/10/2021 09/09/2021 09/08/2021  Weight  (lbs) 158 lb 11.7 oz 157 lb 10.1 oz 159 lb 2.8 oz  Weight (kg) 72 kg 71.5 kg 72.2 kg      Telemetry    NSR with second degree Mobitz type 1 AV block. HR OK- Personally Reviewed  ECG    NSR with mild ST elevation inferior and anterior - Personally Reviewed  Physical Exam   GEN: pale chronically ill appearing WM in No acute distress.   Neck: No JVD Cardiac: RRR, no murmurs, rubs, or gallops. Port a cath in place Respiratory: Clear to auscultation bilaterally. GI: Soft, nontender, non-distended  MS: No edema; No deformity. No radial site hematoma. Neuro:  Nonfocal  Psych: Normal affect   Labs    High Sensitivity Troponin:   Recent Labs  Lab 09/08/21 1219 09/08/21 1419  TROPONINIHS 17,119* 14,964*      Chemistry Recent Labs  Lab 09/08/21 1219 09/09/21 0111 09/10/21 0004  NA 131* 133* 133*  K 4.6 4.2 3.8  CL 102 105 108  CO2 23 20* 20*  GLUCOSE 130* 98 94  BUN 18 18 18   CREATININE 0.85 0.97 1.00  CALCIUM 8.6* 8.4* 7.8*  MG  --   --  1.7  PROT 7.0  --   --   ALBUMIN  2.9*  --   --   AST 101*  --   --   ALT 83*  --   --   ALKPHOS 381*  --   --   BILITOT 2.2*  --   --   GFRNONAA >60 >60 >60  ANIONGAP 6 8 5      Lipids  Recent Labs  Lab 09/09/21 0111  CHOL 167  TRIG 86  HDL 27*  LDLCALC 123*  CHOLHDL 6.2     Hematology Recent Labs  Lab 09/09/21 1630 09/10/21 0210 09/11/21 0113  WBC 13.5* 16.4* 19.1*  RBC 2.68* 2.94* 2.71*  HGB 8.6* 9.1* 8.5*  HCT 25.3* 27.7* 25.3*  MCV 94.4 94.2 93.4  MCH 32.1 31.0 31.4  MCHC 34.0 32.9 33.6  RDW 18.6* 18.7* 18.6*  PLT 116* 123* 124*    Thyroid  Recent Labs  Lab 09/09/21 0111  TSH 0.317*     BNPNo results for input(s): BNP, PROBNP in the last 168 hours.  DDimer  Recent Labs  Lab 09/08/21 1219  DDIMER 1.77*      Radiology    No results found.  Cardiac Studies   LEFT HEART CATH AND CORONARY ANGIOGRAPHY   Conclusion      Prox RCA lesion is 80% stenosed.   Mid LAD lesion is 60%  stenosed.   Dist LAD-1 lesion is 99% stenosed.   Dist LAD-2 lesion is 99% stenosed.   Ramus lesion is 90% stenosed.   2nd Mrg lesion is 80% stenosed.   LV end diastolic pressure is normal.   The left ventricular ejection fraction is 45-50% by visual estimate.   1.  Moderate to severe diffuse disease with subtotally occluded serial distal LAD lesions.  Given the burden of disease and the patient's comorbidities medical management will be pursued.  The results were reviewed with the team C attending Dr. Martinique. 2.  Ejection fraction approximately 40 to 45% with LVEDP of 14 mmHg.  Recommendation: Medical management for acute coronary syndrome. Diagnostic Dominance: Right Intervention   Echo 05/27/21: IMPRESSIONS     1. Left ventricular ejection fraction, by estimation, is 55 to 60%. The  left ventricle has normal function. The left ventricle has no regional  wall motion abnormalities. Left ventricular diastolic parameters were  normal.   2. Right ventricular systolic function is normal. The right ventricular  size is normal. Tricuspid regurgitation signal is inadequate for assessing  PA pressure.   3. The mitral valve is grossly normal. Trivial mitral valve  regurgitation.   4. The aortic valve is tricuspid. There is mild calcification of the  aortic valve. Aortic valve regurgitation is not visualized.   5. The inferior vena cava is normal in size with greater than 50%  respiratory variability, suggesting right atrial pressure of 3 mmHg.   6. No obvious valvular vegetations.   Comparison(s): No prior Echocardiogram.   Patient Profile     66 y.o. male with a history of metastatic cholangiocarcinoma with a life expectancy of 12 to 74 months who presented to [facility] with 24 hours worth of chest pain.  EKG was not diagnostic for ST elevation myocardial infarction however his troponin was elevated consistent with an acute coronary syndrome.   Assessment & Plan    NSTEMI.  Severe 3 vessel disease on cardiac cath. Culprit lesion is distal LAD occlusion. Anatomy is not suitable for PCI. Not a candidate for CABG due to poor prognosis from cholangiocarcinoma. Will manage medically. No recurrent angina. Unable to tolerate  beta blocker with hypotension and bradycardia. On DAPT with ASA and Plavix.   Afib/flutter with slow VR. Later converted to NSR with Wenkebach block. Now in NSR with Wenkebach- HR OK. No beta blocker.  Metastatic cholangiocarcinoma. Poor prognosis. On chemo. HLD LDL 123. Now on statin Severe anemia. Etiology unclear. No clear source of bleeding. Heparin stopped. Transfuse 1 unit PRBCs yesterday with improvement in Hgb from 7.5 to 9.1.  now back to 8.5 Tobacco abuse. Encouraged cessation. DM metformin on hold.  Constipation. Add stool softener.  Orthostatic hypotension. Hydrated last 24 hours. Will DC Flomax. Switch compazine to Zofran. Compression hose. Will try and get up to chair more today and wean Dopamine. Could consider midodrine if no improvement     For questions or updates, please contact [place] Please consult [email or web address] for contact info under       Signed, Noam Franzen [place], MD  09/11/2021, 8:32 AM

## 2021-09-12 ENCOUNTER — Other Ambulatory Visit (HOSPITAL_COMMUNITY): Payer: Self-pay

## 2021-09-12 ENCOUNTER — Encounter (HOSPITAL_COMMUNITY): Payer: Self-pay | Admitting: Hematology

## 2021-09-12 LAB — IRON AND TIBC
Iron: 22 ug/dL — ABNORMAL LOW (ref 45–182)
Saturation Ratios: 16 % — ABNORMAL LOW (ref 17.9–39.5)
TIBC: 139 ug/dL — ABNORMAL LOW (ref 250–450)
UIBC: 117 ug/dL

## 2021-09-12 LAB — RETICULOCYTES
Immature Retic Fract: 12.9 % (ref 2.3–15.9)
RBC.: 2.68 MIL/uL — ABNORMAL LOW (ref 4.22–5.81)
Retic Count, Absolute: 62.7 10*3/uL (ref 19.0–186.0)
Retic Ct Pct: 2.3 % (ref 0.4–3.1)

## 2021-09-12 LAB — CBC
HCT: 24.3 % — ABNORMAL LOW (ref 39.0–52.0)
Hemoglobin: 8.1 g/dL — ABNORMAL LOW (ref 13.0–17.0)
MCH: 31.3 pg (ref 26.0–34.0)
MCHC: 33.3 g/dL (ref 30.0–36.0)
MCV: 93.8 fL (ref 80.0–100.0)
Platelets: 118 10*3/uL — ABNORMAL LOW (ref 150–400)
RBC: 2.59 MIL/uL — ABNORMAL LOW (ref 4.22–5.81)
RDW: 18.6 % — ABNORMAL HIGH (ref 11.5–15.5)
WBC: 12 10*3/uL — ABNORMAL HIGH (ref 4.0–10.5)
nRBC: 0 % (ref 0.0–0.2)

## 2021-09-12 LAB — FOLATE: Folate: 8.3 ng/mL (ref >5.9)

## 2021-09-12 LAB — GLUCOSE, CAPILLARY: Glucose-Capillary: 89 mg/dL (ref 70–99)

## 2021-09-12 LAB — FERRITIN: Ferritin: 1132 ng/mL — ABNORMAL HIGH (ref 24–336)

## 2021-09-12 LAB — VITAMIN B12: Vitamin B-12: 973 pg/mL — ABNORMAL HIGH (ref 180–914)

## 2021-09-12 MED ORDER — MIDODRINE HCL 5 MG PO TABS
5.0000 mg | ORAL_TABLET | Freq: Three times a day (TID) | ORAL | 3 refills | Status: DC
  Filled 2021-09-12: qty 270, 90d supply, fill #0

## 2021-09-12 MED ORDER — HEPARIN SOD (PORK) LOCK FLUSH 100 UNIT/ML IV SOLN
500.0000 [IU] | INTRAVENOUS | Status: AC | PRN
  Administered 2021-09-12: 500 [IU]
  Filled 2021-09-12: qty 5

## 2021-09-12 MED ORDER — CLOPIDOGREL BISULFATE 75 MG PO TABS
75.0000 mg | ORAL_TABLET | Freq: Every day | ORAL | 3 refills | Status: DC
  Filled 2021-09-12: qty 90, 90d supply, fill #0

## 2021-09-12 MED ORDER — NITROGLYCERIN 0.4 MG SL SUBL
0.4000 mg | SUBLINGUAL_TABLET | SUBLINGUAL | 3 refills | Status: DC | PRN
  Filled 2021-09-12: qty 25, 7d supply, fill #0

## 2021-09-12 MED ORDER — PANTOPRAZOLE SODIUM 40 MG PO TBEC
40.0000 mg | DELAYED_RELEASE_TABLET | Freq: Every day | ORAL | 3 refills | Status: DC
  Filled 2021-09-12: qty 90, 90d supply, fill #0

## 2021-09-12 MED ORDER — ATORVASTATIN CALCIUM 40 MG PO TABS
40.0000 mg | ORAL_TABLET | Freq: Every day | ORAL | 3 refills | Status: DC
  Filled 2021-09-12: qty 90, 90d supply, fill #0

## 2021-09-12 NOTE — TOC Initial Note (Signed)
Transition of Care [facility]) - Initial/Assessment Note    Patient Details  Name: Jonathan Keith MRN: [id number] Date of Birth: June 27, 1956  [facility] CM/SW Contact:    Verdell Carmine, RN Phone Number: 09/12/2021, 8:53 AM  Clinical Narrative:                  The Transition of Care Department [facility]) has reviewed patient and no TOC needs have been identified at this time. We will continue to monitor patient advancement through interdisciplinary progression rounds. If new patient transition needs arise, please place a TOC consult        Patient Goals and CMS Choice        Expected Discharge Plan and Services                                                Prior Living Arrangements/Services                       Activities of Daily Living Home Assistive Devices/Equipment: None ADL Screening (condition at time of admission) Patient's cognitive ability adequate to safely complete daily activities?: Yes Is the patient deaf or have difficulty hearing?: No Does the patient have difficulty seeing, even when wearing glasses/contacts?: No Does the patient have difficulty concentrating, remembering, or making decisions?: No Patient able to express need for assistance with ADLs?: Yes Does the patient have difficulty dressing or bathing?: No Independently performs ADLs?: Yes (appropriate for developmental age) Does the patient have difficulty walking or climbing stairs?: No Weakness of Legs: None Weakness of Arms/Hands: None  Permission Sought/Granted                  Emotional Assessment              Admission diagnosis:  NSTEMI (non-ST elevated myocardial infarction) ([place]) [I21.4] ACS (acute coronary syndrome) ([place]) [I24.9] Patient Active Problem List   Diagnosis Date Noted   NSTEMI (non-ST elevated myocardial infarction) ([place]) 09/08/2021   ACS (acute coronary syndrome) ([place]) 09/08/2021   Nausea without vomiting 09/01/2021    Dehydration 07/23/2021   Port-A-Cath in place 07/20/2021   Atherosclerosis of native arteries of extremities with intermittent claudication, bilateral legs ([place]) 06/30/2021   Abnormal levels of other serum enzymes 06/30/2021   Nicotine dependence, cigarettes, uncomplicated 56/38/9373   Biliary tract cancer ([place]) 06/12/2021   Hyperbilirubinemia    Sepsis ([place]) 05/25/2021   SIRS (systemic inflammatory response syndrome) ([place]) 04/28/2021   Cholangiocarcinoma ([place]) 04/10/2021   Calculus of bile duct without cholangitis or cholecystitis without obstruction 04/10/2021   Acute febrile illness 03/12/2021   Acute cholecystitis 03/12/2021   Diverticulitis 03/12/2021   Transaminitis 03/12/2021   Leukocytosis 03/12/2021   Hyponatremia 03/12/2021   Hyperglycemia due to diabetes mellitus ([place]) 03/12/2021   GERD (gastroesophageal reflux disease) 03/12/2021   Hypoalbuminemia due to protein-calorie malnutrition ([place]) 03/12/2021   Type 2 diabetes mellitus without complications ([place]) 42/87/6811   Gastroesophageal reflux disease 03/12/2021   Elevated LFTs    Choledocholithiasis    Obstructive jaundice 02/24/2021   Class 1 obesity due to excess calories with body mass index (BMI) of 31.0 to 31.9 in adult 02/24/2021   Diabetes mellitus ([place]) 02/24/2021   Dyslipidemia 02/24/2021   Hypertension 02/24/2021   Tobacco dependence 02/24/2021   Elevated blood-pressure reading, without diagnosis of hypertension 02/24/2021  PCP:  [name], PA-C Pharmacy:   [id number] - [place], [place] [address] Phone: [phone] Fax: [phone]  [facility] [phone] - [place], [place] [address] [place] [address] Phone: [phone] Fax: [place], [place] [address] Phone: [phone] Fax:  [phone]  [facility] [address] [place] [address] Phone: [phone] Fax: [phone]     Social Determinants of Health (SDOH) Interventions    Readmission Risk Interventions Readmission Risk Prevention Plan 05/27/2021  Transportation Screening Complete  Home Care Screening Complete  Medication Review (RN CM) Complete  Some recent data might be hidden

## 2021-09-12 NOTE — Discharge Summary (Signed)
Discharge Summary    Patient ID: Jonathan Keith MRN: [id number]; DOB: February 26, 1956  Admit date: 09/08/2021 Discharge date: 09/12/2021  PCP:  [name], PA-C   [facility] Providers Cardiologist:  Freada Bergeron, MD :1}     Discharge Diagnoses    Principal Problem:   NSTEMI (non-ST elevated myocardial infarction) [facility]) Active Problems:   Obstructive jaundice   Diabetes mellitus ([place])   Hypertension   Tobacco dependence   Cholangiocarcinoma ([place])   Biliary tract cancer ([place])   Port-A-Cath in place   Atherosclerosis of native arteries of extremities with intermittent claudication, bilateral legs ([place])   Type 2 diabetes mellitus without complications ([place])   ACS (acute coronary syndrome) ([place])    Diagnostic Studies/Procedures    Left heart cath 09/08/21:   Prox RCA lesion is 80% stenosed.   Mid LAD lesion is 60% stenosed.   Dist LAD-1 lesion is 99% stenosed.   Dist LAD-2 lesion is 99% stenosed.   Ramus lesion is 90% stenosed.   2nd Mrg lesion is 80% stenosed.   LV end diastolic pressure is normal.   The left ventricular ejection fraction is 45-50% by visual estimate.   1.  Moderate to severe diffuse disease with subtotally occluded serial distal LAD lesions.  Given the burden of disease and the patient's comorbidities medical management will be pursued.  The results were reviewed with the team C attending Dr. Martinique. 2.  Ejection fraction approximately 40 to 45% with LVEDP of 14 mmHg.  Recommendation: Medical management for acute coronary syndrome.  Diagnostic Dominance: Right  _____________   Echo 05/27/21:  1. Left ventricular ejection fraction, by estimation, is 55 to 60%. The  left ventricle has normal function. The left ventricle has no regional  wall motion abnormalities. Left ventricular diastolic parameters were  normal.   2. Right ventricular systolic function is normal. The right ventricular  size is normal. Tricuspid regurgitation signal  is inadequate for assessing  PA pressure.   3. The mitral valve is grossly normal. Trivial mitral valve  regurgitation.   4. The aortic valve is tricuspid. There is mild calcification of the  aortic valve. Aortic valve regurgitation is not visualized.   5. The inferior vena cava is normal in size with greater than 50%  respiratory variability, suggesting right atrial pressure of 3 mmHg.   6. No obvious valvular vegetations.    History of Present Illness     Jonathan Keith is a 66 y.o. male with a hx of intrahepatic metastatic cholangiocarcinoma status post biliary stenting and right portal vein embolization, HLD, tobacco abuse, HTN, PAD, and DMII who is being seen 09/08/2021 for the evaluation of NSTEMI.  Jonathan Keith 66 year old male with history as detailed above with no known cardiac history who presented to [facility] with substernal chest pressure that began on the evening of admission found to have troponin of [id number] for which Cardiology has been consulted.  The patient states that he was doing overall okay until yesterday evening when he developed substernal chest pressure that awoke him from sleep. He tried to just rest and see if it would go away, however, his symptoms persisted prompting him to go to the [facility]. Prior to the onset of this episode, the patient was feeling progressively fatigued and weak which he attributed to his chemotherapy, but no exertional chest pain or prior episodes of chest pain. He does have chronic dyspnea on exertion from underlying COPD which was not worsened from baseline.  In the ER, trop 17,000. ECG with very subtle STE in inferior leads. Initially STEMI doc was paged, but was deemed not to be a STEMI. CTA negative for PE but with coronary calcification. He was placed on heparin gtt and nitro gtt.    On my interview, the patient was continuing to have 6/10 chest pain. Given symptoms and troponin, the decision was made to transfer to [facility] for urgent coronary  angiography.  Hospital Course     Consultants: none  NSTEMI CAD Hyperlipidemia Initial EKG with minimal ST elevation in inferior leads. STEMI MD was consulted and deemed not a STEMI. HST trended to >17000. Heart cath showed severe three vessel disease with subtotally occluded serial distal LAD lesions. Anatomy was not suitable for PCI and he is is not a CABG candidate. Given burden of disease and comorbidities, medical management was recommended. No BB due to bradycardia and hypotension. Continue DAPT with ASA and plavix. Started on statin.   Systolic heart failure LVEF estimated at 40-45% with LVEDP 14 mmHg during cath - down from 55-60% on echo in 05/2021. Does not appear volume up. GDMT limited by BP.    PAF On 09/10/21, he converted to Afib associated with bradycardia and pauses up to about 4 sec. Eh also had hypotension treated with atropine without response. She was given IVF bolus and dopamine 10 mcg/kg/min started. Converted to sinus with Wenckebach. Hold BB for bradycardia and hypotension. No anticoagulation given cancer.    Orthostatic hypotension Doing well off DA and on midodrine.    DM Restart metformin   Anemia Question if this is related to cancer/chemotherapy Transfused 1U PRBC, Hb now back down to 8.1. Iron studies pending. No active bleeding.    Metastatic cholangiocarcinoma Chemo per oncology Poor prognosis   Pt seen and examined by Dr. Martinique and deemed stable for discharge. Follow up has been arranged.    Did the patient have an acute coronary syndrome (MI, NSTEMI, STEMI, etc) this admission?:  Yes                               AHA/ACC Clinical Performance & Quality Measures: Aspirin prescribed? - Yes ADP Receptor Inhibitor (Plavix/Clopidogrel, Brilinta/Ticagrelor or Effient/Prasugrel) prescribed (includes medically managed patients)? - Yes Beta Blocker prescribed? - No - bradycardia High Intensity Statin (Lipitor 40-80mg  or Crestor 20-40mg )  prescribed? - Yes EF assessed during THIS hospitalization? - Yes For EF <40%, was ACEI/ARB prescribed? - No - Reason:  hypotension For EF <40%, Aldosterone Antagonist (Spironolactone or Eplerenone) prescribed? - No - Reason:  hypotension Cardiac Rehab Phase II ordered (including medically managed patients)? - Yes       The patient will be scheduled for a TOC follow up appointment in 7-14 days.  A message has been sent to the [facility] and Scheduling Pool at the office where the patient should be seen for follow up.  _____________  Discharge Vitals Blood pressure 126/75, pulse 92, temperature 98.5 F (36.9 C), temperature source Oral, resp. rate 18, height 5\' 11"  (1.803 m), weight 71.8 kg, SpO2 95 %.  Filed Weights   09/09/21 0600 09/10/21 0300 09/12/21 0500  Weight: 71.5 kg 72 kg 71.8 kg    Labs & Radiologic Studies    CBC Recent Labs    09/11/21 0113 09/12/21 0420  WBC 19.1* 12.0*  HGB 8.5* 8.1*  HCT 25.3* 24.3*  MCV 93.4 93.8  PLT 124* 542*   Basic Metabolic Panel  Recent Labs    09/10/21 0004  NA 133*  K 3.8  CL 108  CO2 20*  GLUCOSE 94  BUN 18  CREATININE 1.00  CALCIUM 7.8*  MG 1.7   Liver Function Tests No results for input(s): AST, ALT, ALKPHOS, BILITOT, PROT, ALBUMIN in the last 72 hours. No results for input(s): LIPASE, AMYLASE in the last 72 hours. High Sensitivity Troponin:   Recent Labs  Lab 09/08/21 1219 09/08/21 1419  TROPONINIHS 17,119* 14,964*    BNP Invalid input(s): POCBNP D-Dimer No results for input(s): DDIMER in the last 72 hours. Hemoglobin A1C No results for input(s): HGBA1C in the last 72 hours. Fasting Lipid Panel No results for input(s): CHOL, HDL, LDLCALC, TRIG, CHOLHDL, LDLDIRECT in the last 72 hours. Thyroid Function Tests No results for input(s): TSH, T4TOTAL, T3FREE, THYROIDAB in the last 72 hours.  Invalid input(s): FREET3 _____________  DG Chest 2 View  Result Date: 09/08/2021 CLINICAL DATA:  Provided history: Chest  pain. Additional history provided: Patient reports history of bile duct cancer currently on chemotherapy. EXAM: CHEST - 2 VIEW COMPARISON:  Prior chest radiographs 05/25/2021 and earlier. Chest CT 03/12/2021. FINDINGS: A right chest infusion port catheter is present with tip projecting at the level of the lower SVC. Heart size within normal limits. No appreciable airspace consolidation or pulmonary edema. No evidence of pleural effusion or pneumothorax. No acute bony abnormality identified. Degenerative changes of the spine. Redemonstrated metallic coils within the right upper abdomen. IMPRESSION: No evidence of acute cardiopulmonary abnormality. Electronically Signed   By: Kellie Simmering D.O.   On: 09/08/2021 12:21   CT Angio Chest PE W and/or Wo Contrast  Result Date: 09/08/2021 CLINICAL DATA:  Chest pain last night with elevated D-dimer levels. Concern for pulmonary embolism. History of metastatic Linda carcinoma, hypertension and diabetes. EXAM: CT ANGIOGRAPHY CHEST WITH CONTRAST TECHNIQUE: Multidetector CT imaging of the chest was performed using the standard protocol during bolus administration of intravenous contrast. Multiplanar CT image reconstructions and MIPs were obtained to evaluate the vascular anatomy. RADIATION DOSE REDUCTION: This exam was performed according to the departmental dose-optimization program which includes automated exposure control, adjustment of the mA and/or kV according to patient size and/or use of iterative reconstruction technique. CONTRAST:  6mL OMNIPAQUE IOHEXOL 350 MG/ML SOLN COMPARISON:  Chest CT 03/12/2021. Abdominal CT 05/25/2021. FINDINGS: Cardiovascular: The pulmonary arteries are well opacified with contrast to the level of the subsegmental branches. There is no evidence of acute pulmonary embolism. Right IJ Port-A-Cath extends to the upper right atrium. Extensive coronary artery atherosclerosis and intimal irregularity of the thoracic aorta are noted. No acute  systemic vascular findings are seen. The heart size is normal. There is no pericardial effusion. Mediastinum/Nodes: There are no enlarged mediastinal, hilar or axillary lymph nodes.Small mediastinal lymph nodes are unchanged. The thyroid gland, trachea and esophagus demonstrate no significant findings. Lungs/Pleura: No pleural effusion or pneumothorax. Mild diffuse central airway thickening with scattered linear scarring in the right lower lobe and lingula. No suspicious pulmonary nodules. Probable dependent secretions within the right mainstem bronchus. Upper abdomen: The visualized upper abdomen demonstrates no acute findings. There is intrahepatic biliary dilatation which appears similar to the prior study. No visualized pneumobilia within the superior aspect of the liver. Musculoskeletal/Chest wall: There is no chest wall mass or suspicious osseous finding. Thoracic spondylosis. Review of the MIP images confirms the above findings. IMPRESSION: 1. No evidence of acute pulmonary embolism or other acute chest findings. 2. Coronary and Aortic Atherosclerosis (ICD10-I70.0). 3. Central  airway thickening with probable dependent secretions in the right mainstem bronchus. 4. No evidence of thoracic metastatic disease. 5. Grossly stable intrahepatic biliary dilatation, incompletely visualized. Electronically Signed   By: Richardean Sale M.D.   On: 09/08/2021 13:56   CARDIAC CATHETERIZATION  Result Date: 09/08/2021   Prox RCA lesion is 80% stenosed.   Mid LAD lesion is 60% stenosed.   Dist LAD-1 lesion is 99% stenosed.   Dist LAD-2 lesion is 99% stenosed.   Ramus lesion is 90% stenosed.   2nd Mrg lesion is 80% stenosed.   LV end diastolic pressure is normal.   The left ventricular ejection fraction is 45-50% by visual estimate. 1.  Moderate to severe diffuse disease with subtotally occluded serial distal LAD lesions.  Given the burden of disease and the patient's comorbidities medical management will be pursued.  The  results were reviewed with the team C attending Dr. Martinique. 2.  Ejection fraction approximately 40 to 45% with LVEDP of 14 mmHg. Recommendation: Medical management for acute coronary syndrome.   Disposition   Pt is being discharged home today in good condition.  Follow-up Plans & Appointments     Follow-up Information     Freada Bergeron, MD Follow up on 10/08/2021.   Specialties: Cardiology, Radiology Why: 11:40 am Contact information: [address] [place] [address] [phone]                Discharge Instructions     Diet - low sodium heart healthy   Complete by: As directed    Discharge instructions   Complete by: As directed    No driving for 2 days. No lifting over 5 lbs for 1 week. No sexual activity for 1 week. Keep procedure site clean & dry. If you notice increased pain, swelling, bleeding or pus, call/return!  You may shower, but no soaking baths/hot tubs/pools for 1 week.   Increase activity slowly   Complete by: As directed        Discharge Medications   Allergies as of 09/12/2021   No Known Allergies      Medication List     STOP taking these medications    methylphenidate 5 MG tablet Commonly known as: Ritalin       TAKE these medications    acetaminophen 500 MG tablet Commonly known as: TYLENOL Take 1,000 mg by mouth every 6 (six) hours as needed for moderate pain or headache.   aspirin EC 81 MG tablet Take 81 mg by mouth as needed (chest pain). Swallow whole.   atorvastatin 40 MG tablet Commonly known as: LIPITOR Take 1 tablet (40 mg total) by mouth daily at 6 PM.   CISPLATIN IV Inject into the vein once a week. Days 1, 8 every 21 days   clopidogrel 75 MG tablet Commonly known as: PLAVIX Take 1 tablet (75 mg total) by mouth daily with breakfast. Start taking on: September 13, 2021   famotidine 20 MG tablet Commonly known as: PEPCID Take 20 mg by mouth daily.   ferrous sulfate 325 (65 FE) MG  tablet Commonly known as: FerrouSul Take 1 tablet (325 mg total) by mouth daily with breakfast.   GEMZAR IV Inject into the vein once a week. Days 1, 8 every 21 days   IMFINZI IV Inject into the vein every 21 ( twenty-one) days.   lidocaine-prilocaine cream Commonly known as: EMLA Apply a small amount to port a cath site and cover with plastic wrap 1 hour  prior to chemotherapy appointments   loratadine 10 MG tablet Commonly known as: CLARITIN Take 10 mg by mouth daily. Takes for a few days after shot to boost WBC. About every 2nd treatment   megestrol 400 MG/10ML suspension Commonly known as: MEGACE Take 10 mLs (400 mg total) by mouth 2 (two) times daily.   metFORMIN 500 MG tablet Commonly known as: GLUCOPHAGE Take 1 tablet (500 mg total) by mouth 2 (two) times daily with a meal.   midodrine 5 MG tablet Commonly known as: PROAMATINE Take 1 tablet (5 mg total) by mouth 3 (three) times daily with meals.   nitroGLYCERIN 0.4 MG SL tablet Commonly known as: Nitrostat Place 1 tablet (0.4 mg total) under the tongue every 5 (five) minutes as needed for chest pain.   pantoprazole 40 MG tablet Commonly known as: PROTONIX Take 1 tablet (40 mg total) by mouth daily.   prochlorperazine 10 MG tablet Commonly known as: COMPAZINE Take 1 tablet (10 mg total) by mouth every 6 (six) hours as needed (Nausea or vomiting). What changed:  when to take this reasons to take this   ROBITUSSIN DM PO Take 1 Dose by mouth at bedtime as needed (allergy flare up). 2 tsp at night as needed for allergy flare ups   tamsulosin 0.4 MG Caps capsule Commonly known as: FLOMAX Take 1 capsule (0.4 mg total) by mouth at bedtime.           Outstanding Labs/Studies     Duration of Discharge Encounter   Greater than 30 minutes including physician time.  Signed, Ledora Bottcher, PA 09/12/2021, 9:36 AM

## 2021-09-12 NOTE — Progress Notes (Signed)
Progress Note  Patient Name: Jonathan Keith Date of Encounter: 09/12/2021  [facility] Cardiologist: None new. Dr Johney Frame  Subjective   Doing better today. Off dopamine. BP good on midodrine. Ambulated in halls.   Inpatient Medications    Scheduled Meds:  aspirin  81 mg Oral Daily   atorvastatin  40 mg Oral q1800   Chlorhexidine Gluconate Cloth  6 each Topical Daily   clopidogrel  75 mg Oral Q breakfast   docusate sodium  100 mg Oral Daily   enoxaparin (LOVENOX) injection  40 mg Subcutaneous Q24H   ferrous sulfate  325 mg Oral Q breakfast   insulin aspart  0-5 Units Subcutaneous QHS   insulin aspart  0-6 Units Subcutaneous TID WC   midodrine  5 mg Oral TID WC   multivitamin with minerals  1 tablet Oral Daily   pantoprazole  40 mg Oral Daily   sodium chloride flush  3 mL Intravenous Q12H   sodium chloride flush  3 mL Intravenous Q12H   Continuous Infusions:  sodium chloride     DOPamine Stopped (09/11/21 1726)   PRN Meds: sodium chloride, acetaminophen **OR** acetaminophen, bisacodyl, ondansetron **OR** ondansetron (ZOFRAN) IV, ondansetron (ZOFRAN) IV, polyethylene glycol, prochlorperazine, sodium chloride flush, traZODone   Vital Signs    Vitals:   09/12/21 0400 09/12/21 0500 09/12/21 0600 09/12/21 0700  BP: 126/73 123/75 137/72 126/75  Pulse: 76 72 73 92  Resp: (!) 21 19 (!) 21 18  Temp:    98.5 F (36.9 C)  TempSrc:    Oral  SpO2: 96% 93% 96% 95%  Weight:  71.8 kg    Height:        Intake/Output Summary (Last 24 hours) at 09/12/2021 0811 Last data filed at 09/12/2021 0700 Gross per 24 hour  Intake 769.99 ml  Output 1 ml  Net 768.99 ml    Last 3 Weights 09/12/2021 09/10/2021 09/09/2021  Weight (lbs) 158 lb 4.6 oz 158 lb 11.7 oz 157 lb 10.1 oz  Weight (kg) 71.8 kg 72 kg 71.5 kg      Telemetry    NSR with intermittent second degree Mobitz type 1 AV block. HR OK- much less than before. Personally Reviewed  ECG    NSR with mild ST elevation inferior  and anterior - Personally Reviewed  Physical Exam   GEN: pale chronically ill appearing WM in No acute distress.   Neck: No JVD Cardiac: RRR, no murmurs, rubs, or gallops. Port a cath in place Respiratory: Clear to auscultation bilaterally. GI: Soft, nontender, non-distended  MS: No edema; No deformity. No radial site hematoma. Neuro:  Nonfocal  Psych: Normal affect   Labs    High Sensitivity Troponin:   Recent Labs  Lab 09/08/21 1219 09/08/21 1419  TROPONINIHS 17,119* 14,964*      Chemistry Recent Labs  Lab 09/08/21 1219 09/09/21 0111 09/10/21 0004  NA 131* 133* 133*  K 4.6 4.2 3.8  CL 102 105 108  CO2 23 20* 20*  GLUCOSE 130* 98 94  BUN 18 18 18   CREATININE 0.85 0.97 1.00  CALCIUM 8.6* 8.4* 7.8*  MG  --   --  1.7  PROT 7.0  --   --   ALBUMIN 2.9*  --   --   AST 101*  --   --   ALT 83*  --   --   ALKPHOS 381*  --   --   BILITOT 2.2*  --   --   GFRNONAA >60 >  60 >60  ANIONGAP 6 8 5      Lipids  Recent Labs  Lab 09/09/21 0111  CHOL 167  TRIG 86  HDL 27*  LDLCALC 123*  CHOLHDL 6.2     Hematology Recent Labs  Lab 09/10/21 0210 09/11/21 0113 09/12/21 0420  WBC 16.4* 19.1* 12.0*  RBC 2.94* 2.71* 2.59*  HGB 9.1* 8.5* 8.1*  HCT 27.7* 25.3* 24.3*  MCV 94.2 93.4 93.8  MCH 31.0 31.4 31.3  MCHC 32.9 33.6 33.3  RDW 18.7* 18.6* 18.6*  PLT 123* 124* 118*    Thyroid  Recent Labs  Lab 09/09/21 0111  TSH 0.317*     BNPNo results for input(s): BNP, PROBNP in the last 168 hours.  DDimer  Recent Labs  Lab 09/08/21 1219  DDIMER 1.77*      Radiology    No results found.  Cardiac Studies   LEFT HEART CATH AND CORONARY ANGIOGRAPHY   Conclusion      Prox RCA lesion is 80% stenosed.   Mid LAD lesion is 60% stenosed.   Dist LAD-1 lesion is 99% stenosed.   Dist LAD-2 lesion is 99% stenosed.   Ramus lesion is 90% stenosed.   2nd Mrg lesion is 80% stenosed.   LV end diastolic pressure is normal.   The left ventricular ejection fraction is  45-50% by visual estimate.   1.  Moderate to severe diffuse disease with subtotally occluded serial distal LAD lesions.  Given the burden of disease and the patient's comorbidities medical management will be pursued.  The results were reviewed with the team C attending Dr. Martinique. 2.  Ejection fraction approximately 40 to 45% with LVEDP of 14 mmHg.  Recommendation: Medical management for acute coronary syndrome. Diagnostic Dominance: Right Intervention   Echo 05/27/21: IMPRESSIONS     1. Left ventricular ejection fraction, by estimation, is 55 to 60%. The  left ventricle has normal function. The left ventricle has no regional  wall motion abnormalities. Left ventricular diastolic parameters were  normal.   2. Right ventricular systolic function is normal. The right ventricular  size is normal. Tricuspid regurgitation signal is inadequate for assessing  PA pressure.   3. The mitral valve is grossly normal. Trivial mitral valve  regurgitation.   4. The aortic valve is tricuspid. There is mild calcification of the  aortic valve. Aortic valve regurgitation is not visualized.   5. The inferior vena cava is normal in size with greater than 50%  respiratory variability, suggesting right atrial pressure of 3 mmHg.   6. No obvious valvular vegetations.   Comparison(s): No prior Echocardiogram.   Patient Profile     66 y.o. male with a history of metastatic cholangiocarcinoma with a life expectancy of 12 to 86 months who presented to [facility] with 24 hours worth of chest pain.  EKG was not diagnostic for ST elevation myocardial infarction however his troponin was elevated consistent with an acute coronary syndrome.   Assessment & Plan    NSTEMI. Severe 3 vessel disease on cardiac cath. Culprit lesion is distal LAD occlusion. Anatomy is not suitable for PCI. Not a candidate for CABG due to poor prognosis from cholangiocarcinoma. Will manage medically. No recurrent angina. Unable  to tolerate beta blocker with hypotension and bradycardia. On DAPT with ASA and Plavix.   Afib/flutter with slow VR. Later converted to NSR with Wenkebach block. Now in NSR with Wenkebach- HR OK. No beta blocker. Not a good candidate for anticoagulation. Metastatic cholangiocarcinoma. Poor prognosis. On  chemo. HLD LDL 123. Now on statin Severe anemia. Etiology unclear. No clear source of bleeding. ? Chemo related.  Transfuse 1 unit PRBCswith improvement in Hgb from 7.5 to 9.1.  now back to 8.1. had iron stores checked last August. Will repeat. Has follow up with Heme/onc on 2/14. On iron supplement. Tobacco abuse. Encouraged cessation. DM metformin on hold.  Constipation. Add stool softener.  Orthostatic hypotension. Hydrated last 24 hours. Will DC Flomax. Switch compazine to Zofran. Compression hose. BP much better today on midodrine. Off Dopamine  Plan: patient is stable for DC home today.      For questions or updates, please contact [place] Please consult [email or web address] for contact info under       Signed, Aundria Bitterman Martinique, MD  09/12/2021, 8:11 AM

## 2021-09-15 ENCOUNTER — Telehealth: Payer: Self-pay | Admitting: Cardiology

## 2021-09-15 ENCOUNTER — Encounter (HOSPITAL_COMMUNITY): Payer: Self-pay | Admitting: *Deleted

## 2021-09-15 NOTE — Progress Notes (Signed)
Gavan Mano's wife Thayer Headings was contacted by telephone to verify understanding of discharge instructions status post their most recent discharge from the hospital on the date:  09/12/21.  Inpatient discharge AVS was re-reviewed with patient, along with cancer center appointments.  Verification of understanding for oncology specific follow-up was validated using the Teach Back method.  Confirmed with Dr. Delton Coombes that he would like for him to keep appointments on 2/14 and not sooner, due to recent illness.  Transportation to appointments were confirmed for the patient as being self/caregiver.  Julio Alm wife's questions were addressed to their satisfaction upon completion of this post discharge follow-up call for outpatient oncology.

## 2021-09-15 NOTE — Telephone Encounter (Signed)
Okay with me 

## 2021-09-15 NOTE — Telephone Encounter (Signed)
Patient requesting to switch from Dr. Johney Frame to Dr. Johnsie Cancel in [place].

## 2021-09-22 ENCOUNTER — Telehealth: Payer: Self-pay | Admitting: Physician Assistant

## 2021-09-22 ENCOUNTER — Telehealth: Payer: Self-pay | Admitting: Cardiology

## 2021-09-22 NOTE — Telephone Encounter (Signed)
error 

## 2021-09-22 NOTE — Telephone Encounter (Signed)
Pt c/o medication issue:   1. Name of Medication: midodrine (PROAMATINE) 5 MG tablet   2. How are you currently taking this medication (dosage and times per day)?  5 mg 3 times daily   3. Are you having a reaction (difficulty breathing--STAT)?    4. What is your medication issue? Wife of patient wanted to know if it would be OK for the patient to be propped up to sleep. The patient gets tired and sleeps frequently as a result of the cancer but the medication instructions state that the patient needs to sit up for 4 hours after each dose

## 2021-09-22 NOTE — Telephone Encounter (Signed)
Pt c/o medication issue:  1. Name of Medication: midodrine (PROAMATINE) 5 MG tablet  2. How are you currently taking this medication (dosage and times per day)?  5 mg 3 times daily  3. Are you having a reaction (difficulty breathing--STAT)?   4. What is your medication issue? Wife of patient wanted to know if it would be OK for the patient to be propped up to sleep. The patient gets tired and sleeps frequently as a result of the cancer but the medication instructions state that the patient needs to sit up for 4 hours after each dose

## 2021-09-23 ENCOUNTER — Encounter (HOSPITAL_COMMUNITY): Payer: Self-pay | Admitting: Hematology

## 2021-09-23 ENCOUNTER — Inpatient Hospital Stay (HOSPITAL_COMMUNITY): Payer: Medicare Other | Attending: Hematology | Admitting: Hematology

## 2021-09-23 ENCOUNTER — Inpatient Hospital Stay (HOSPITAL_COMMUNITY): Payer: Medicare Other

## 2021-09-23 ENCOUNTER — Other Ambulatory Visit: Payer: Self-pay

## 2021-09-23 VITALS — BP 137/82 | HR 83 | Temp 97.6°F | Resp 18

## 2021-09-23 DIAGNOSIS — M549 Dorsalgia, unspecified: Secondary | ICD-10-CM | POA: Diagnosis not present

## 2021-09-23 DIAGNOSIS — C221 Intrahepatic bile duct carcinoma: Secondary | ICD-10-CM | POA: Diagnosis present

## 2021-09-23 DIAGNOSIS — E119 Type 2 diabetes mellitus without complications: Secondary | ICD-10-CM | POA: Diagnosis not present

## 2021-09-23 DIAGNOSIS — R109 Unspecified abdominal pain: Secondary | ICD-10-CM | POA: Insufficient documentation

## 2021-09-23 DIAGNOSIS — Z596 Low income: Secondary | ICD-10-CM | POA: Diagnosis not present

## 2021-09-23 DIAGNOSIS — Z95828 Presence of other vascular implants and grafts: Secondary | ICD-10-CM

## 2021-09-23 DIAGNOSIS — R978 Other abnormal tumor markers: Secondary | ICD-10-CM

## 2021-09-23 DIAGNOSIS — F17210 Nicotine dependence, cigarettes, uncomplicated: Secondary | ICD-10-CM | POA: Insufficient documentation

## 2021-09-23 DIAGNOSIS — Z8249 Family history of ischemic heart disease and other diseases of the circulatory system: Secondary | ICD-10-CM | POA: Insufficient documentation

## 2021-09-23 DIAGNOSIS — R5383 Other fatigue: Secondary | ICD-10-CM | POA: Insufficient documentation

## 2021-09-23 DIAGNOSIS — Z5189 Encounter for other specified aftercare: Secondary | ICD-10-CM | POA: Insufficient documentation

## 2021-09-23 DIAGNOSIS — R0602 Shortness of breath: Secondary | ICD-10-CM | POA: Diagnosis not present

## 2021-09-23 DIAGNOSIS — Z5111 Encounter for antineoplastic chemotherapy: Secondary | ICD-10-CM | POA: Insufficient documentation

## 2021-09-23 DIAGNOSIS — I10 Essential (primary) hypertension: Secondary | ICD-10-CM | POA: Diagnosis not present

## 2021-09-23 DIAGNOSIS — Z5112 Encounter for antineoplastic immunotherapy: Secondary | ICD-10-CM | POA: Diagnosis present

## 2021-09-23 DIAGNOSIS — C772 Secondary and unspecified malignant neoplasm of intra-abdominal lymph nodes: Secondary | ICD-10-CM | POA: Insufficient documentation

## 2021-09-23 DIAGNOSIS — Z79899 Other long term (current) drug therapy: Secondary | ICD-10-CM | POA: Diagnosis not present

## 2021-09-23 LAB — COMPREHENSIVE METABOLIC PANEL
ALT: 53 U/L — ABNORMAL HIGH (ref 0–44)
AST: 46 U/L — ABNORMAL HIGH (ref 15–41)
Albumin: 2.6 g/dL — ABNORMAL LOW (ref 3.5–5.0)
Alkaline Phosphatase: 450 U/L — ABNORMAL HIGH (ref 38–126)
Anion gap: 12 (ref 5–15)
BUN: 16 mg/dL (ref 8–23)
CO2: 20 mmol/L — ABNORMAL LOW (ref 22–32)
Calcium: 9 mg/dL (ref 8.9–10.3)
Chloride: 100 mmol/L (ref 98–111)
Creatinine, Ser: 0.89 mg/dL (ref 0.61–1.24)
GFR, Estimated: >60 mL/min (ref >60)
Glucose, Bld: 156 mg/dL — ABNORMAL HIGH (ref 70–99)
Potassium: 4 mmol/L (ref 3.5–5.1)
Sodium: 132 mmol/L — ABNORMAL LOW (ref 135–145)
Total Bilirubin: 1 mg/dL (ref 0.3–1.2)
Total Protein: 7 g/dL (ref 6.5–8.1)

## 2021-09-23 LAB — CBC WITH DIFFERENTIAL/PLATELET
Abs Immature Granulocytes: 0.04 10*3/uL (ref 0.00–0.07)
Basophils Absolute: 0.1 10*3/uL (ref 0.0–0.1)
Basophils Relative: 1 %
Eosinophils Absolute: 0.6 10*3/uL — ABNORMAL HIGH (ref 0.0–0.5)
Eosinophils Relative: 6 %
HCT: 31.6 % — ABNORMAL LOW (ref 39.0–52.0)
Hemoglobin: 10.5 g/dL — ABNORMAL LOW (ref 13.0–17.0)
Immature Granulocytes: 0 %
Lymphocytes Relative: 19 %
Lymphs Abs: 1.8 10*3/uL (ref 0.7–4.0)
MCH: 31.8 pg (ref 26.0–34.0)
MCHC: 33.2 g/dL (ref 30.0–36.0)
MCV: 95.8 fL (ref 80.0–100.0)
Monocytes Absolute: 1.3 10*3/uL — ABNORMAL HIGH (ref 0.1–1.0)
Monocytes Relative: 13 %
Neutro Abs: 5.7 10*3/uL (ref 1.7–7.7)
Neutrophils Relative %: 61 %
Platelets: 341 10*3/uL (ref 150–400)
RBC: 3.3 MIL/uL — ABNORMAL LOW (ref 4.22–5.81)
RDW: 17.2 % — ABNORMAL HIGH (ref 11.5–15.5)
WBC: 9.5 10*3/uL (ref 4.0–10.5)
nRBC: 0 % (ref 0.0–0.2)

## 2021-09-23 LAB — MAGNESIUM: Magnesium: 1.6 mg/dL — ABNORMAL LOW (ref 1.7–2.4)

## 2021-09-23 MED ORDER — HEPARIN SOD (PORK) LOCK FLUSH 100 UNIT/ML IV SOLN
500.0000 [IU] | Freq: Once | INTRAVENOUS | Status: AC | PRN
  Administered 2021-09-23: 500 [IU]

## 2021-09-23 MED ORDER — SODIUM CHLORIDE 0.9 % IV SOLN
150.0000 mg | Freq: Once | INTRAVENOUS | Status: AC
  Administered 2021-09-23: 150 mg via INTRAVENOUS
  Filled 2021-09-23: qty 150

## 2021-09-23 MED ORDER — PALONOSETRON HCL INJECTION 0.25 MG/5ML
0.2500 mg | Freq: Once | INTRAVENOUS | Status: AC
  Administered 2021-09-23: 0.25 mg via INTRAVENOUS
  Filled 2021-09-23: qty 5

## 2021-09-23 MED ORDER — SODIUM CHLORIDE 0.9 % IV SOLN
1500.0000 mg | Freq: Once | INTRAVENOUS | Status: AC
  Administered 2021-09-23: 1500 mg via INTRAVENOUS
  Filled 2021-09-23: qty 30

## 2021-09-23 MED ORDER — SODIUM CHLORIDE 0.9 % IV SOLN: Freq: Once | INTRAVENOUS | Status: AC

## 2021-09-23 MED ORDER — MAGNESIUM SULFATE 2 GM/50ML IV SOLN
2.0000 g | Freq: Once | INTRAVENOUS | Status: AC
  Administered 2021-09-23: 2 g via INTRAVENOUS
  Filled 2021-09-23: qty 50

## 2021-09-23 MED ORDER — SODIUM CHLORIDE 0.9 % IV SOLN
10.0000 mg | Freq: Once | INTRAVENOUS | Status: AC
  Administered 2021-09-23: 10 mg via INTRAVENOUS
  Filled 2021-09-23: qty 10

## 2021-09-23 MED ORDER — SODIUM CHLORIDE 0.9 % IV SOLN
25.0000 mg/m2 | Freq: Once | INTRAVENOUS | Status: AC
  Administered 2021-09-23: 48 mg via INTRAVENOUS
  Filled 2021-09-23: qty 48

## 2021-09-23 MED ORDER — POTASSIUM CHLORIDE IN NACL 20-0.9 MEQ/L-% IV SOLN
Freq: Once | INTRAVENOUS | Status: AC
  Filled 2021-09-23: qty 1000

## 2021-09-23 MED ORDER — SODIUM CHLORIDE 0.9 % IV SOLN
500.0000 mg/m2 | Freq: Once | INTRAVENOUS | Status: AC
  Administered 2021-09-23: 950 mg via INTRAVENOUS
  Filled 2021-09-23: qty 24.98

## 2021-09-23 MED ORDER — SODIUM CHLORIDE 0.9% FLUSH
10.0000 mL | INTRAVENOUS | Status: DC | PRN
  Administered 2021-09-23: 10 mL

## 2021-09-23 NOTE — Patient Instructions (Addendum)
[place] at [facility] Discharge Instructions   You were seen and examined today by Dr. Delton Coombes.  He reviewed the results of your lab work which is normal/stable.  We will proceed with your treatment today.  We will reduce the dose of one of the drugs you receive (Gemzar) to help prevent anemia in the setting of his recent heart attack.   Return as scheduled for lab work, office visit, and treatments.    Thank you for choosing [place] at [facility] to provide your oncology and hematology care.  To afford each patient quality time with our provider, please arrive at least 15 minutes before your scheduled appointment time.   If you have a lab appointment with the [facility] please come in thru the Main Entrance and check in at the main information desk.  You need to re-schedule your appointment should you arrive 10 or more minutes late.  We strive to give you quality time with our providers, and arriving late affects you and other patients whose appointments are after yours.  Also, if you no show three or more times for appointments you may be dismissed from the clinic at the providers discretion.     Again, thank you for choosing [facility].  Our hope is that these requests will decrease the amount of time that you wait before being seen by our physicians.       _____________________________________________________________  Should you have questions after your visit to [facility], please contact our office at [phone] and follow the prompts.  Our office hours are 8:00 a.m. and 4:30 p.m. Monday - Friday.  Please note that voicemails left after 4:00 p.m. may not be returned until the following business day.  We are closed weekends and major holidays.  You do have access to a nurse 24-7, just call the main number to the clinic [phone] and do not press any options, hold on the line and a nurse will  answer the phone.    For prescription refill requests, have your pharmacy contact our office and allow 72 hours.    Due to Covid, you will need to wear a mask upon entering the hospital. If you do not have a mask, a mask will be given to you at the Main Entrance upon arrival. For doctor visits, patients may have 1 support person age 50 or older with them. For treatment visits, patients can not have anyone with them due to social distancing guidelines and our immunocompromised population.

## 2021-09-23 NOTE — Progress Notes (Signed)
Pt presents today for Imfinzi/Gemzar/Cisplatin per provider's order. Vital signs and other labs WNL for treatment.  Dr. Raliegh Ip is going to dose reduce Gemzar to 500 mg/m2. Okay to proceed treatment with treatment per Dr.K  Pt urinated 359mL's prior to Cisplatin administration.  Imfinzi/Gemzar/Cisplatin given today per MD orders. Tolerated infusion without adverse affects. Vital signs stable. No complaints at this time. Discharged from clinic via wheelchair in stable condition. Alert and oriented x 3. F/U with [facility] as scheduled.

## 2021-09-23 NOTE — Progress Notes (Signed)
Patients port flushed without difficulty.  Good blood return noted with no bruising or swelling noted at site. Stable during access and blood draw.  Patient to remain accessed for possible treatment.  

## 2021-09-23 NOTE — Progress Notes (Signed)
Patient has been examined by Dr. Delton Coombes, and vital signs and labs have been reviewed. ANC, Creatinine, LFTs, hemoglobin, and platelets are within treatment parameters per M.D. - pt may proceed with treatment.   Gemzar will be dose reduced to 500 mg/m2 per MD.

## 2021-09-23 NOTE — Patient Instructions (Signed)
[place]  Discharge Instructions: Thank you for choosing [place] to provide your oncology and hematology care.  If you have a lab appointment with the [facility], please come in thru the Main Entrance and check in at the main information desk.  Wear comfortable clothing and clothing appropriate for easy access to any Portacath or PICC line.   We strive to give you quality time with your provider. You may need to reschedule your appointment if you arrive late (15 or more minutes).  Arriving late affects you and other patients whose appointments are after yours.  Also, if you miss three or more appointments without notifying the office, you may be dismissed from the clinic at the providers discretion.      For prescription refill requests, have your pharmacy contact our office and allow 72 hours for refills to be completed.    Today you received the following chemotherapy and/or immunotherapy agents Imfinzi,Gemzar,Cisplatin   To help prevent nausea and vomiting after your treatment, we encourage you to take your nausea medication as directed.  BELOW ARE SYMPTOMS THAT SHOULD BE REPORTED IMMEDIATELY: *FEVER GREATER THAN 100.4 F (38 C) OR HIGHER *CHILLS OR SWEATING *NAUSEA AND VOMITING THAT IS NOT CONTROLLED WITH YOUR NAUSEA MEDICATION *UNUSUAL SHORTNESS OF BREATH *UNUSUAL BRUISING OR BLEEDING *URINARY PROBLEMS (pain or burning when urinating, or frequent urination) *BOWEL PROBLEMS (unusual diarrhea, constipation, pain near the anus) TENDERNESS IN MOUTH AND THROAT WITH OR WITHOUT PRESENCE OF ULCERS (sore throat, sores in mouth, or a toothache) UNUSUAL RASH, SWELLING OR PAIN  UNUSUAL VAGINAL DISCHARGE OR ITCHING   Items with * indicate a potential emergency and should be followed up as soon as possible or go to the Emergency Department if any problems should occur.  Please show the CHEMOTHERAPY ALERT CARD or IMMUNOTHERAPY ALERT CARD at check-in to the  Emergency Department and triage nurse.  Should you have questions after your visit or need to cancel or reschedule your appointment, please contact [facility] [phone]  and follow the prompts.  Office hours are 8:00 a.m. to 4:30 p.m. Monday - Friday. Please note that voicemails left after 4:00 p.m. may not be returned until the following business day.  We are closed weekends and major holidays. You have access to a nurse at all times for urgent questions. Please call the main number to the clinic [phone] and follow the prompts.  For any non-urgent questions, you may also contact your provider using MyChart. We now offer e-Visits for anyone 48 and older to request care online for non-urgent symptoms. For details visit [email or web address].   Also download the MyChart app! Go to the app store, search "MyChart", open the app, select [place], and log in with your MyChart username and password.  Due to Covid, a mask is required upon entering the hospital/clinic. If you do not have a mask, one will be given to you upon arrival. For doctor visits, patients may have 1 support person aged 61 or older with them. For treatment visits, patients cannot have anyone with them due to current Covid guidelines and our immunocompromised population.

## 2021-09-23 NOTE — Progress Notes (Signed)
[place], [name] [id number]   CLINIC:  Medical Oncology/Hematology  PCP:  [name], PA-C [place] / [place] [address] [phone]   REASON FOR VISIT:  Follow-up for intrahepatic cholangiocarcinoma  PRIOR THERAPY: none  NGS Results: not done  CURRENT THERAPY: Cisplatin + Gemcitabine D1,8 q21d  BRIEF ONCOLOGIC HISTORY:  Oncology History  Cholangiocarcinoma ([place])  02/28/2021 Initial Diagnosis   Cholangiocarcinoma ([place])   07/17/2021 Cancer Staging   Staging form: Perihilar Bile Ducts, AJCC 8th Edition - Clinical stage from 07/17/2021: Stage IVB (cTX, cNX, pM1) - Signed by Derek Jack, MD on 07/17/2021 Stage prefix: Initial diagnosis    07/22/2021 -  Chemotherapy   Patient is on Treatment Plan : BILIARY TRACT Cisplatin + Gemcitabine D1,8 q21d       CANCER STAGING:  Cancer Staging  Cholangiocarcinoma ([place]) Staging form: Perihilar Bile Ducts, AJCC 8th Edition - Clinical stage from 07/17/2021: Stage IVB (cTX, cNX, pM1) - Signed by Derek Jack, MD on 07/17/2021   INTERVAL HISTORY:  [name], a 66 y.o. male, returns for routine follow-up and consideration for next cycle of chemotherapy. Jaizon was last seen on 09/02/2021.  Due for cycle #4 of Cisplatin + Gemcitabine today.   Overall, he tells me he has been feeling pretty well. He reports SOB with exertion and back pain. He has lost 6 lbs since his last visit. He denies n/v/d and good appetite following his last treatment. He has not required Megace. He reports mild abdominal pain at his surgical site. He denies leg swellings. He reports fatigue.   Overall, he feels ready for next cycle of chemo today.    REVIEW OF SYSTEMS:  Review of Systems  Constitutional:  Positive for fatigue and unexpected weight change (-6 lbs). Negative for appetite change.  Respiratory:  Positive for shortness of breath.   Gastrointestinal:  Positive  for abdominal pain (@ surgical site). Negative for diarrhea, nausea and vomiting.  Musculoskeletal:  Positive for back pain (4/10).  All other systems reviewed and are negative.  PAST MEDICAL/SURGICAL HISTORY:  Past Medical History:  Diagnosis Date   Cancer ([place])    Chronic pain    neck, back, knees   Class 1 obesity due to excess calories with body mass index (BMI) of 31.0 to 31.9 in adult    Claudication [facility])    DDD (degenerative disc disease), cervical    Diabetes mellitus ([place])    Dyslipidemia    Hypertension    Port-A-Cath in place 07/20/2021   Tobacco dependence    Past Surgical History:  Procedure Laterality Date   BILIARY BRUSHING N/A 02/25/2021   Procedure: BILIARY BRUSHING;  Surgeon: Rogene Houston, MD;  Location: [facility];  Service: Gastroenterology;  Laterality: N/A;   BILIARY STENT PLACEMENT N/A 02/25/2021   Procedure: BILIARY STENT PLACEMENT;  Surgeon: Rogene Houston, MD;  Location: [facility];  Service: Gastroenterology;  Laterality: N/A;   BILIARY STENT PLACEMENT  02/27/2021   Procedure: BILIARY STENT PLACEMENT (10FR x 9cm) IN THE RIGHT SYSTEM;  Surgeon: Rogene Houston, MD;  Location: [facility];  Service: Gastroenterology;;   BREAST SURGERY Left    benign lump- in his 52s.   ERCP N/A 02/25/2021   Procedure: ENDOSCOPIC RETROGRADE CHOLANGIOPANCREATOGRAPHY (ERCP);  Surgeon: Rogene Houston, MD;  Location: [facility];  Service: Gastroenterology;  Laterality: N/A;   ERCP N/A 02/27/2021   Procedure: ENDOSCOPIC RETROGRADE CHOLANGIOPANCREATOGRAPHY (ERCP);  Surgeon: Rogene Houston,  MD;  Location: [facility];  Service: Gastroenterology;  Laterality: N/A;   IR IMAGING GUIDED PORT INSERTION  07/21/2021   KNEE SURGERY Left    x2   LEFT HEART CATH AND CORONARY ANGIOGRAPHY N/A 09/08/2021   Procedure: LEFT HEART CATH AND CORONARY ANGIOGRAPHY;  Surgeon: Early Osmond, MD;  Location: [facility];  Service: Cardiovascular;  Laterality: N/A;   SPHINCTEROTOMY N/A 02/25/2021   Procedure:  SPHINCTEROTOMY;  Surgeon: Rogene Houston, MD;  Location: [facility];  Service: Gastroenterology;  Laterality: N/A;   STENT REMOVAL  02/27/2021   Procedure: STENT REMOVAL (8.5Fr x 9cm);  Surgeon: Rogene Houston, MD;  Location: [facility];  Service: Gastroenterology;;    SOCIAL HISTORY:  Social History   Socioeconomic History   Marital status: Married    Spouse name: Not on file   Number of children: Not on file   Years of education: Not on file   Highest education level: Not on file  Occupational History   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  Tobacco Use   Smoking status: Every Day    Packs/day: 0.50    Years: 56.00    Pack years: 28.00    Types: Cigarettes   Smokeless tobacco: Never  Vaping Use   Vaping Use: Never used  Substance and Sexual Activity   Alcohol use: Not Currently    Comment: stopped 2.5 years ago 03/11/21   Drug use: Never   Sexual activity: Not on file  Other Topics Concern   Not on file  Social History Narrative   Not on file   Social Determinants of Health   Financial Resource Strain: Medium Risk   Difficulty of Paying Living Expenses: Somewhat hard  Food Insecurity: No Food Insecurity   Worried About Charity fundraiser in the Last Year: Never true   [place] in the Last Year: Never true  Transportation Needs: No Transportation Needs   Lack of Transportation (Medical): No   Lack of Transportation (Non-Medical): No  Physical Activity: Insufficiently Active   Days of Exercise per Week: 2 days   Minutes of Exercise per Session: 20 min  Stress: No Stress Concern Present   Feeling of Stress : Only a little  Social Connections: Moderately Integrated   Frequency of Communication with Friends and Family: More than three times a week   Frequency of Social Gatherings with Friends and Family: More than three times a week   Attends Religious Services: More than 4 times per year   Active Member of [employer] or Organizations: No   Attends Arts administrator: Never   Marital Status: Married  Human resources officer Violence: Not At Risk   Fear of Current or Ex-Partner: No   Emotionally Abused: No   Physically Abused: No   Sexually Abused: No    FAMILY HISTORY:  Family History  Problem Relation Age of Onset   CAD Mother    Cancer Neg Hx     CURRENT MEDICATIONS:  Current Outpatient Medications  Medication Sig Dispense Refill   acetaminophen (TYLENOL) 500 MG tablet Take 1,000 mg by mouth every 6 (six) hours as needed for moderate pain or headache.     aspirin EC 81 MG tablet Take 81 mg by mouth as needed (chest pain). Swallow whole.     atorvastatin (LIPITOR) 40 MG tablet Take 1 tablet (40 mg total) by mouth daily at 6 PM. 90 tablet 3   CISPLATIN IV Inject into the vein once a week.  Days 1, 8 every 21 days     clopidogrel (PLAVIX) 75 MG tablet Take 1 tablet (75 mg total) by mouth daily with breakfast. 90 tablet 3   Dextromethorphan-guaiFENesin (ROBITUSSIN DM PO) Take 1 Dose by mouth at bedtime as needed (allergy flare up). 2 tsp at night as needed for allergy flare ups     Durvalumab (IMFINZI IV) Inject into the vein every 21 ( twenty-one) days.     famotidine (PEPCID) 20 MG tablet Take 20 mg by mouth daily.     ferrous sulfate (FERROUSUL) 325 (65 FE) MG tablet Take 1 tablet (325 mg total) by mouth daily with breakfast.     Gemcitabine HCl (GEMZAR IV) Inject into the vein once a week. Days 1, 8 every 21 days     lidocaine-prilocaine (EMLA) cream Apply a small amount to port a cath site and cover with plastic wrap 1 hour prior to chemotherapy appointments 30 g 3   loratadine (CLARITIN) 10 MG tablet Take 10 mg by mouth daily. Takes for a few days after shot to boost WBC. About every 2nd treatment     megestrol (MEGACE) 400 MG/10ML suspension Take 10 mLs (400 mg total) by mouth 2 (two) times daily. (Patient not taking: Reported on 09/08/2021) 480 mL 0   metFORMIN (GLUCOPHAGE) 500 MG tablet Take 1 tablet (500 mg total) by mouth 2 (two) times  daily with a meal. 60 tablet 0   midodrine (PROAMATINE) 5 MG tablet Take 1 tablet (5 mg total) by mouth 3 (three) times daily with meals. 270 tablet 3   nitroGLYCERIN (NITROSTAT) 0.4 MG SL tablet Place 1 tablet (0.4 mg total) under the tongue every 5 (five) minutes as needed for chest pain. 25 tablet 3   pantoprazole (PROTONIX) 40 MG tablet Take 1 tablet (40 mg total) by mouth daily. 90 tablet 3   prochlorperazine (COMPAZINE) 10 MG tablet Take 1 tablet (10 mg total) by mouth every 6 (six) hours as needed (Nausea or vomiting). (Patient taking differently: Take 10 mg by mouth every 8 (eight) hours as needed for nausea or vomiting (Nausea or vomiting).) 60 tablet 3   tamsulosin (FLOMAX) 0.4 MG CAPS capsule Take 1 capsule (0.4 mg total) by mouth at bedtime. 30 capsule 3   No current facility-administered medications for this visit.    ALLERGIES:  No Known Allergies  PHYSICAL EXAM:  Performance status (ECOG): 0 - Asymptomatic  There were no vitals filed for this visit. Wt Readings from Last 3 Encounters:  09/23/21 153 lb (69.4 kg)  09/12/21 158 lb 4.6 oz (71.8 kg)  09/02/21 154 lb 12.8 oz (70.2 kg)   Physical Exam Vitals reviewed.  Constitutional:      Appearance: Normal appearance.  Cardiovascular:     Rate and Rhythm: Normal rate and regular rhythm.     Pulses: Normal pulses.     Heart sounds: Normal heart sounds.  Pulmonary:     Effort: Pulmonary effort is normal.     Breath sounds: Normal breath sounds.  Musculoskeletal:     Right lower leg: No edema.     Left lower leg: No edema.  Neurological:     General: No focal deficit present.     Mental Status: He is alert and oriented to person, place, and time.  Psychiatric:        Mood and Affect: Mood normal.        Behavior: Behavior normal.    LABORATORY DATA:  I have reviewed the labs as listed.  CBC Latest Ref Rng & Units 09/12/2021 09/11/2021 09/10/2021  WBC 4.0 - 10.5 K/uL 12.0(H) 19.1(H) 16.4(H)  Hemoglobin 13.0 - 17.0 g/dL  8.1(L) 8.5(L) 9.1(L)  Hematocrit 39.0 - 52.0 % 24.3(L) 25.3(L) 27.7(L)  Platelets 150 - 400 K/uL 118(L) 124(L) 123(L)   CMP Latest Ref Rng & Units 09/10/2021 09/09/2021 09/08/2021  Glucose 70 - 99 mg/dL 94 98 130(H)  BUN 8 - 23 mg/dL _0 Creatinine 0.61 - 1.24 mg/dL 1.00 0.97 0.85  Sodium 135 - 145 mmol/L 133(L) 133(L) 131(L)  Potassium 3.5 - 5.1 mmol/L 3.8 4.2 4.6  Chloride 98 - 111 mmol/L 108 105 102  CO2 22 - 32 mmol/L 20(L) 20(L) 23  Calcium 8.9 - 10.3 mg/dL 7.8(L) 8.4(L) 8.6(L)  Total Protein 6.5 - 8.1 g/dL - - 7.0  Total Bilirubin 0.3 - 1.2 mg/dL - - 2.2(H)  Alkaline Phos 38 - 126 U/L - - 381(H)  AST 15 - 41 U/L - - 101(H)  ALT 0 - 44 U/L - - 83(H)    DIAGNOSTIC IMAGING:  I have independently reviewed the scans and discussed with the patient. DG Chest 2 View  Result Date: 09/08/2021 CLINICAL DATA:  Provided history: Chest pain. Additional history provided: Patient reports history of bile duct cancer currently on chemotherapy. EXAM: CHEST - 2 VIEW COMPARISON:  Prior chest radiographs 05/25/2021 and earlier. Chest CT 03/12/2021. FINDINGS: A right chest infusion port catheter is present with tip projecting at the level of the lower SVC. Heart size within normal limits. No appreciable airspace consolidation or pulmonary edema. No evidence of pleural effusion or pneumothorax. No acute bony abnormality identified. Degenerative changes of the spine. Redemonstrated metallic coils within the right upper abdomen. IMPRESSION: No evidence of acute cardiopulmonary abnormality. Electronically Signed   By: Kellie Simmering D.O.   On: 09/08/2021 12:21   CT Angio Chest PE W and/or Wo Contrast  Result Date: 09/08/2021 CLINICAL DATA:  Chest pain last night with elevated D-dimer levels. Concern for pulmonary embolism. History of metastatic Linda carcinoma, hypertension and diabetes. EXAM: CT ANGIOGRAPHY CHEST WITH CONTRAST TECHNIQUE: Multidetector CT imaging of the chest was performed using the standard  protocol during bolus administration of intravenous contrast. Multiplanar CT image reconstructions and MIPs were obtained to evaluate the vascular anatomy. RADIATION DOSE REDUCTION: This exam was performed according to the departmental dose-optimization program which includes automated exposure control, adjustment of the mA and/or kV according to patient size and/or use of iterative reconstruction technique. CONTRAST:  4m OMNIPAQUE IOHEXOL 350 MG/ML SOLN COMPARISON:  Chest CT 03/12/2021. Abdominal CT 05/25/2021. FINDINGS: Cardiovascular: The pulmonary arteries are well opacified with contrast to the level of the subsegmental branches. There is no evidence of acute pulmonary embolism. Right IJ Port-A-Cath extends to the upper right atrium. Extensive coronary artery atherosclerosis and intimal irregularity of the thoracic aorta are noted. No acute systemic vascular findings are seen. The heart size is normal. There is no pericardial effusion. Mediastinum/Nodes: There are no enlarged mediastinal, hilar or axillary lymph nodes.Small mediastinal lymph nodes are unchanged. The thyroid gland, trachea and esophagus demonstrate no significant findings. Lungs/Pleura: No pleural effusion or pneumothorax. Mild diffuse central airway thickening with scattered linear scarring in the right lower lobe and lingula. No suspicious pulmonary nodules. Probable dependent secretions within the right mainstem bronchus. Upper abdomen: The visualized upper abdomen demonstrates no acute findings. There is intrahepatic biliary dilatation which appears similar to the prior study. No visualized pneumobilia within the superior aspect of the liver. Musculoskeletal/Chest wall: There  is no chest wall mass or suspicious osseous finding. Thoracic spondylosis. Review of the MIP images confirms the above findings. IMPRESSION: 1. No evidence of acute pulmonary embolism or other acute chest findings. 2. Coronary and Aortic Atherosclerosis (ICD10-I70.0).  3. Central airway thickening with probable dependent secretions in the right mainstem bronchus. 4. No evidence of thoracic metastatic disease. 5. Grossly stable intrahepatic biliary dilatation, incompletely visualized. Electronically Signed   By: Richardean Sale M.D.   On: 09/08/2021 13:56   CARDIAC CATHETERIZATION  Result Date: 09/08/2021   Prox RCA lesion is 80% stenosed.   Mid LAD lesion is 60% stenosed.   Dist LAD-1 lesion is 99% stenosed.   Dist LAD-2 lesion is 99% stenosed.   Ramus lesion is 90% stenosed.   2nd Mrg lesion is 80% stenosed.   LV end diastolic pressure is normal.   The left ventricular ejection fraction is 45-50% by visual estimate. 1.  Moderate to severe diffuse disease with subtotally occluded serial distal LAD lesions.  Given the burden of disease and the patient's comorbidities medical management will be pursued.  The results were reviewed with the team C attending Dr. Martinique. 2.  Ejection fraction approximately 40 to 45% with LVEDP of 14 mmHg. Recommendation: Medical management for acute coronary syndrome.     ASSESSMENT:  Metastatic cholangiocarcinoma: - 02/2021 initial diagnosis presentation with painless jaundice - ERCP with sphincterotomy, subsequent ERCP with plastic biliary stent placement. - MRI-2 cm mass involving the common hepatic duct with upstream biliary dilatation. - 03/2021 consult at [facility] with GI and surgery-confirm diagnosis of perihilar cholangiocarcinoma - 03/21/2021-biopsy/pathology, ERCP-common hepatic duct cytology and brushings performed.  EUS-1 enlarged lymph node versus tumor implant in the gastrohepatic ligament-negative for malignancy.  Single biliary plastic stent removed and replaced with the plastic stent in the right and left ducts. - 04/17/2021-right portal vein embolization to promote left hepatic hypertrophy. - 07/02/2021-diagnostic laparoscopy with multiple areas concerning for metastatic blocks identified in the abdomen and peritoneal surfaces.   6 separate biopsies obtained. - Pathology consistent with metastatic carcinoma. - 37 pound weight loss in the last 5 months. - Cycle 1 of gemcitabine, cisplatin, durvalumab on 07/22/2021.  2.  Social history/family: - Lives at home with his wife.  He worked as a Development worker, community. - Current active smoker, 1 pack/day since age 46. - Maternal grandmother had cancer.  Maternal uncle had brain tumor.   PLAN:  Metastatic cholangiocarcinoma: - He had NSTEMI recently.  Stents could not be placed.  He was not considered a candidate for CABG due to his terminal cancer.  I have reviewed his hospitalization records. - He does not report any chest pains at this time. - He is on midodrine 5 mg 3 times daily.  Blood pressure systolic is 093. - He tolerated last cycle very well.  He missed day 8 due to MI. - Reviewed labs today which showed elevated AST and ALT and alk phos.  CBC was grossly normal with hemoglobin improved to 10.5.  Recent ferritin, A35 and folic acid were normal. - We have discussed continuing treatment can worsen his anemia and may cause chest pain.  I have recommended decreasing dose of gemcitabine to 500 mg per metered square and watch his CBC closely. - He will proceed with cycle 4-day 1 with dose reduced gemcitabine. - RTC 3 weeks for follow-up.  I plan to repeat CT CAP and CA 19-9.  2.  Weight loss: - Continue Compazine 3 times daily.  It is controlling the  nausea.  Weight is stable.  3.  Cancer fatigue: - Ritalin was discontinued after his heart attack.  4.  Urinary problems: - Continue Flomax 0.4 mg at bedtime.   Orders placed this encounter:  No orders of the defined types were placed in this encounter.    Derek Jack, MD [facility] [phone]   I, Thana Ates, am acting as a scribe for Dr. Derek Jack.  I, Derek Jack MD, have reviewed the above documentation for accuracy and completeness, and I agree with the  above.

## 2021-09-24 ENCOUNTER — Other Ambulatory Visit (HOSPITAL_COMMUNITY): Payer: Self-pay

## 2021-09-24 ENCOUNTER — Telehealth (HOSPITAL_COMMUNITY): Payer: Self-pay

## 2021-09-24 LAB — CANCER ANTIGEN 19-9: CA 19-9: 218 U/mL — ABNORMAL HIGH (ref 0–35)

## 2021-09-24 NOTE — Telephone Encounter (Signed)
LVM informing patient.

## 2021-09-24 NOTE — Telephone Encounter (Signed)
Transitions of Care Pharmacy   Call attempted for a pharmacy transitions of care follow-up. HIPAA appropriate voicemail was left with call back information provided.   Call attempt #1 09/24/21. Will follow-up in 2-3 days.

## 2021-09-24 NOTE — Telephone Encounter (Signed)
Spoke with wife.  Explained how midodrine works and that patient can safely lie down after taking medication.  Reviewed that he may not need evening dose if he is not moving around much after dinner.  Wife voiced understanding and appreciated call.

## 2021-09-25 ENCOUNTER — Telehealth (HOSPITAL_COMMUNITY): Payer: Self-pay | Admitting: Pharmacist

## 2021-09-25 ENCOUNTER — Other Ambulatory Visit (HOSPITAL_COMMUNITY): Payer: Self-pay

## 2021-09-25 NOTE — Telephone Encounter (Signed)
Pharmacy Transitions of Care Follow-up Telephone Call  Date of discharge: 09/12/21  Discharge Diagnosis: NSTEMI  Spoke with Thayer Headings, pts wife.  Medication changes made at discharge:  - START:  atorvastatin (LIPITOR)  clopidogrel (PLAVIX)  midodrine (PROAMATINE)  nitroGLYCERIN (Nitrostat)  pantoprazole (PROTONIX)   - STOPPED: methylphenidate  - CHANGED: n/a  Medication changes verified by the patient? yes    Medication Accessibility:  Home Pharmacy: [facility]   Was the patient provided with refills on discharged medications? yes   Have all prescriptions been transferred from [facility] to home pharmacy? yes   Is the patient able to afford medications? PartD plan Notable copays: all generic Eligible patient assistance: n/a    Medication Review:   CLOPIDOGREL (PLAVIX) Clopidogrel 75 mg once daily.  - Educated patient on expected duration of therapy of clopidogrel.  - Pts wife is under the impression that he should take ASA only as needed for chest pain.  I have asked her to reach out to cardiology office to confirm if the ASA should be daily or prn.  She was comfortable doing this. - Reviewed potential DDIs with patient  - Advised patient of medications to avoid (NSAIDs, ASA)  - Educated that Tylenol (acetaminophen) will be the preferred analgesic to prevent risk of bleeding  - Emphasized importance of monitoring for signs and symptoms of bleeding (abnormal bruising, prolonged bleeding, nose bleeds, bleeding from gums, discolored urine, black tarry stools)  - Advised patient to alert all providers of anticoagulation therapy prior to starting a new medication or having a procedure    Follow-up Appointments:  PCP Hospital f/u appt confirmed? none   [facility] f/u appt confirmed? Was scheduled on 3/1 with Gwyndolyn Kaufman, cardiology.  Pt needs to reschedule and will do so today.    If their condition worsens, is the pt aware to call PCP or go to the Emergency  Dept.? yes  Final Patient Assessment: Pts wife cares for his medications.  She reports compliance.  We reviewed each medication to clarify what the medications are being used for. Denies any issues with bleeding or bruising.

## 2021-09-30 ENCOUNTER — Encounter (HOSPITAL_COMMUNITY): Payer: Self-pay

## 2021-09-30 ENCOUNTER — Inpatient Hospital Stay (HOSPITAL_COMMUNITY): Payer: Medicare Other

## 2021-09-30 ENCOUNTER — Other Ambulatory Visit: Payer: Self-pay

## 2021-09-30 VITALS — BP 122/76 | HR 85 | Temp 97.9°F | Resp 18

## 2021-09-30 DIAGNOSIS — Z5112 Encounter for antineoplastic immunotherapy: Secondary | ICD-10-CM | POA: Diagnosis not present

## 2021-09-30 DIAGNOSIS — C221 Intrahepatic bile duct carcinoma: Secondary | ICD-10-CM

## 2021-09-30 DIAGNOSIS — Z95828 Presence of other vascular implants and grafts: Secondary | ICD-10-CM

## 2021-09-30 LAB — COMPREHENSIVE METABOLIC PANEL
ALT: 59 U/L — ABNORMAL HIGH (ref 0–44)
AST: 48 U/L — ABNORMAL HIGH (ref 15–41)
Albumin: 2.6 g/dL — ABNORMAL LOW (ref 3.5–5.0)
Alkaline Phosphatase: 428 U/L — ABNORMAL HIGH (ref 38–126)
Anion gap: 7 (ref 5–15)
BUN: 17 mg/dL (ref 8–23)
CO2: 25 mmol/L (ref 22–32)
Calcium: 8.9 mg/dL (ref 8.9–10.3)
Chloride: 98 mmol/L (ref 98–111)
Creatinine, Ser: 0.89 mg/dL (ref 0.61–1.24)
GFR, Estimated: >60 mL/min (ref >60)
Glucose, Bld: 162 mg/dL — ABNORMAL HIGH (ref 70–99)
Potassium: 4.2 mmol/L (ref 3.5–5.1)
Sodium: 130 mmol/L — ABNORMAL LOW (ref 135–145)
Total Bilirubin: 1 mg/dL (ref 0.3–1.2)
Total Protein: 6.7 g/dL (ref 6.5–8.1)

## 2021-09-30 LAB — CBC WITH DIFFERENTIAL/PLATELET
Abs Immature Granulocytes: 0.01 10*3/uL (ref 0.00–0.07)
Basophils Absolute: 0.1 10*3/uL (ref 0.0–0.1)
Basophils Relative: 1 %
Eosinophils Absolute: 0.1 10*3/uL (ref 0.0–0.5)
Eosinophils Relative: 2 %
HCT: 29.7 % — ABNORMAL LOW (ref 39.0–52.0)
Hemoglobin: 9.9 g/dL — ABNORMAL LOW (ref 13.0–17.0)
Immature Granulocytes: 0 %
Lymphocytes Relative: 25 %
Lymphs Abs: 1.4 10*3/uL (ref 0.7–4.0)
MCH: 31.9 pg (ref 26.0–34.0)
MCHC: 33.3 g/dL (ref 30.0–36.0)
MCV: 95.8 fL (ref 80.0–100.0)
Monocytes Absolute: 0.9 10*3/uL (ref 0.1–1.0)
Monocytes Relative: 15 %
Neutro Abs: 3.1 10*3/uL (ref 1.7–7.7)
Neutrophils Relative %: 57 %
Platelets: 141 10*3/uL — ABNORMAL LOW (ref 150–400)
RBC: 3.1 MIL/uL — ABNORMAL LOW (ref 4.22–5.81)
RDW: 16.6 % — ABNORMAL HIGH (ref 11.5–15.5)
WBC: 5.6 10*3/uL (ref 4.0–10.5)
nRBC: 0 % (ref 0.0–0.2)

## 2021-09-30 LAB — MAGNESIUM: Magnesium: 1.7 mg/dL (ref 1.7–2.4)

## 2021-09-30 MED ORDER — SODIUM CHLORIDE 0.9 % IV SOLN: Freq: Once | INTRAVENOUS | Status: AC

## 2021-09-30 MED ORDER — SODIUM CHLORIDE 0.9 % IV SOLN
10.0000 mg | Freq: Once | INTRAVENOUS | Status: AC
  Administered 2021-09-30: 10 mg via INTRAVENOUS
  Filled 2021-09-30: qty 10

## 2021-09-30 MED ORDER — HEPARIN SOD (PORK) LOCK FLUSH 100 UNIT/ML IV SOLN
500.0000 [IU] | Freq: Once | INTRAVENOUS | Status: AC | PRN
  Administered 2021-09-30: 500 [IU]

## 2021-09-30 MED ORDER — PALONOSETRON HCL INJECTION 0.25 MG/5ML
0.2500 mg | Freq: Once | INTRAVENOUS | Status: AC
  Administered 2021-09-30: 0.25 mg via INTRAVENOUS
  Filled 2021-09-30: qty 5

## 2021-09-30 MED ORDER — SODIUM CHLORIDE 0.9 % IV SOLN
26.0000 mg/m2 | Freq: Once | INTRAVENOUS | Status: AC
  Administered 2021-09-30: 50 mg via INTRAVENOUS
  Filled 2021-09-30: qty 50

## 2021-09-30 MED ORDER — POTASSIUM CHLORIDE IN NACL 20-0.9 MEQ/L-% IV SOLN
Freq: Once | INTRAVENOUS | Status: AC
  Filled 2021-09-30: qty 1000

## 2021-09-30 MED ORDER — MAGNESIUM SULFATE 2 GM/50ML IV SOLN
2.0000 g | Freq: Once | INTRAVENOUS | Status: AC
  Administered 2021-09-30: 2 g via INTRAVENOUS
  Filled 2021-09-30: qty 50

## 2021-09-30 MED ORDER — SODIUM CHLORIDE 0.9 % IV SOLN
150.0000 mg | Freq: Once | INTRAVENOUS | Status: AC
  Administered 2021-09-30: 150 mg via INTRAVENOUS
  Filled 2021-09-30: qty 150

## 2021-09-30 MED ORDER — SODIUM CHLORIDE 0.9 % IV SOLN
500.0000 mg/m2 | Freq: Once | INTRAVENOUS | Status: AC
  Administered 2021-09-30: 950 mg via INTRAVENOUS
  Filled 2021-09-30: qty 24.98

## 2021-09-30 MED ORDER — SODIUM CHLORIDE 0.9% FLUSH
10.0000 mL | INTRAVENOUS | Status: DC | PRN
  Administered 2021-09-30: 10 mL

## 2021-09-30 NOTE — Patient Instructions (Signed)
[facility]  Discharge Instructions: Thank you for choosing [facility] to provide your oncology and hematology care.  If you have a lab appointment with the [facility], please come in thru the Main Entrance and check in at the main information desk.  Wear comfortable clothing and clothing appropriate for easy access to any Portacath or PICC line.   We strive to give you quality time with your provider. You may need to reschedule your appointment if you arrive late (15 or more minutes).  Arriving late affects you and other patients whose appointments are after yours.  Also, if you miss three or more appointments without notifying the office, you may be dismissed from the clinic at the providers discretion.      For prescription refill requests, have your pharmacy contact our office and allow 72 hours for refills to be completed.    Today you received the following chemotherapy and/or immunotherapy agents Cisplatin/Gemzar, return as scheduled.   To help prevent nausea and vomiting after your treatment, we encourage you to take your nausea medication as directed.  BELOW ARE SYMPTOMS THAT SHOULD BE REPORTED IMMEDIATELY: *FEVER GREATER THAN 100.4 F (38 C) OR HIGHER *CHILLS OR SWEATING *NAUSEA AND VOMITING THAT IS NOT CONTROLLED WITH YOUR NAUSEA MEDICATION *UNUSUAL SHORTNESS OF BREATH *UNUSUAL BRUISING OR BLEEDING *URINARY PROBLEMS (pain or burning when urinating, or frequent urination) *BOWEL PROBLEMS (unusual diarrhea, constipation, pain near the anus) TENDERNESS IN MOUTH AND THROAT WITH OR WITHOUT PRESENCE OF ULCERS (sore throat, sores in mouth, or a toothache) UNUSUAL RASH, SWELLING OR PAIN  UNUSUAL VAGINAL DISCHARGE OR ITCHING   Items with * indicate a potential emergency and should be followed up as soon as possible or go to the Emergency Department if any problems should occur.  Please show the CHEMOTHERAPY ALERT CARD or IMMUNOTHERAPY ALERT CARD at  check-in to the Emergency Department and triage nurse.  Should you have questions after your visit or need to cancel or reschedule your appointment, please contact [facility] [phone]  and follow the prompts.  Office hours are 8:00 a.m. to 4:30 p.m. Monday - Friday. Please note that voicemails left after 4:00 p.m. may not be returned until the following business day.  We are closed weekends and major holidays. You have access to a nurse at all times for urgent questions. Please call the main number to the clinic [phone] and follow the prompts.  For any non-urgent questions, you may also contact your provider using MyChart. We now offer e-Visits for anyone 91 and older to request care online for non-urgent symptoms. For details visit [email or web address].   Also download the MyChart app! Go to the app store, search "MyChart", open the app, select [facility], and log in with your MyChart username and password.  Due to Covid, a mask is required upon entering the hospital/clinic. If you do not have a mask, one will be given to you upon arrival. For doctor visits, patients may have 1 support person aged 33 or older with them. For treatment visits, patients cannot have anyone with them due to current Covid guidelines and our immunocompromised population.

## 2021-09-30 NOTE — Progress Notes (Signed)
Patient presents today for Gemzar/Cisplatin. Labs within treatment parameters. Patient tolerated chemotherapy with no complaints voiced. Side effects with management reviewed understanding verbalized. Port site clean and dry with no bruising or swelling noted at site. Good blood return noted before and after administration of chemotherapy. Band aid applied. Patient left in satisfactory condition with VSS and no s/s of distress noted.

## 2021-09-30 NOTE — Progress Notes (Signed)
Patients port flushed without difficulty.  Good blood return noted with no bruising or swelling noted at site.  Stable during access and blood draw.  Patient to remain accessed for treatment. 

## 2021-10-01 ENCOUNTER — Ambulatory Visit (HOSPITAL_COMMUNITY): Payer: Medicare Other

## 2021-10-02 ENCOUNTER — Encounter (HOSPITAL_COMMUNITY): Payer: Self-pay

## 2021-10-02 ENCOUNTER — Inpatient Hospital Stay (HOSPITAL_BASED_OUTPATIENT_CLINIC_OR_DEPARTMENT_OTHER): Payer: Medicare Other

## 2021-10-02 ENCOUNTER — Other Ambulatory Visit: Payer: Self-pay

## 2021-10-02 VITALS — BP 109/70 | HR 95 | Temp 97.5°F | Resp 18

## 2021-10-02 DIAGNOSIS — Z95828 Presence of other vascular implants and grafts: Secondary | ICD-10-CM

## 2021-10-02 DIAGNOSIS — C221 Intrahepatic bile duct carcinoma: Secondary | ICD-10-CM

## 2021-10-02 DIAGNOSIS — Z5112 Encounter for antineoplastic immunotherapy: Secondary | ICD-10-CM | POA: Diagnosis not present

## 2021-10-02 MED ORDER — PEGFILGRASTIM-CBQV 6 MG/0.6ML ~~LOC~~ SOSY
6.0000 mg | PREFILLED_SYRINGE | Freq: Once | SUBCUTANEOUS | Status: AC
  Administered 2021-10-02: 6 mg via SUBCUTANEOUS
  Filled 2021-10-02: qty 0.6

## 2021-10-02 NOTE — Progress Notes (Signed)
Patient tolerated injection with no complaints voiced.  Site clean and dry with no bruising or swelling noted at site.  See MAR for details.  Band aid applied.  Patient stable during and after injection.  Vss with discharge and left in satisfactory condition with no s/s of distress noted.  

## 2021-10-02 NOTE — Patient Instructions (Signed)
[facility]  Discharge Instructions: Thank you for choosing [facility] to provide your oncology and hematology care.  If you have a lab appointment with the Cancer Center, please come in thru the Main Entrance and check in at the main information desk.  Wear comfortable clothing and clothing appropriate for easy access to any Portacath or PICC line.   We strive to give you quality time with your provider. You may need to reschedule your appointment if you arrive late (15 or more minutes).  Arriving late affects you and other patients whose appointments are after yours.  Also, if you miss three or more appointments without notifying the office, you may be dismissed from the clinic at the provider's discretion.      For prescription refill requests, have your pharmacy contact our office and allow 72 hours for refills to be completed.        To help prevent nausea and vomiting after your treatment, we encourage you to take your nausea medication as directed.  BELOW ARE SYMPTOMS THAT SHOULD BE REPORTED IMMEDIATELY: *FEVER GREATER THAN 100.4 F (38 C) OR HIGHER *CHILLS OR SWEATING *NAUSEA AND VOMITING THAT IS NOT CONTROLLED WITH YOUR NAUSEA MEDICATION *UNUSUAL SHORTNESS OF BREATH *UNUSUAL BRUISING OR BLEEDING *URINARY PROBLEMS (pain or burning when urinating, or frequent urination) *BOWEL PROBLEMS (unusual diarrhea, constipation, pain near the anus) TENDERNESS IN MOUTH AND THROAT WITH OR WITHOUT PRESENCE OF ULCERS (sore throat, sores in mouth, or a toothache) UNUSUAL RASH, SWELLING OR PAIN  UNUSUAL VAGINAL DISCHARGE OR ITCHING   Items with * indicate a potential emergency and should be followed up as soon as possible or go to the Emergency Department if any problems should occur.  Please show the CHEMOTHERAPY ALERT CARD or IMMUNOTHERAPY ALERT CARD at check-in to the Emergency Department and triage nurse.  Should you have questions after your visit or need to cancel  or reschedule your appointment, please contact [facility] [phone]  and follow the prompts.  Office hours are 8:00 a.m. to 4:30 p.m. Monday - Friday. Please note that voicemails left after 4:00 p.m. may not be returned until the following business day.  We are closed weekends and major holidays. You have access to a nurse at all times for urgent questions. Please call the main number to the clinic [phone] and follow the prompts.  For any non-urgent questions, you may also contact your provider using MyChart. We now offer e-Visits for anyone 18 and older to request care online for non-urgent symptoms. For details visit [email or web address].   Also download the MyChart app! Go to the app store, search "MyChart", open the app, select [facility], and log in with your MyChart username and password.  Due to Covid, a mask is required upon entering the hospital/clinic. If you do not have a mask, one will be given to you upon arrival. For doctor visits, patients may have 1 support person aged 18 or older with them. For treatment visits, patients cannot have anyone with them due to current Covid guidelines and our immunocompromised population.  

## 2021-10-06 ENCOUNTER — Inpatient Hospital Stay (HOSPITAL_COMMUNITY): Payer: Medicare Other | Admitting: Dietician

## 2021-10-06 NOTE — Progress Notes (Signed)
Patient did not show for scheduled nutrition appointment. Will plan to see patient during Udenyca injection on 3/16.

## 2021-10-08 ENCOUNTER — Ambulatory Visit: Payer: Medicare Other | Admitting: Cardiology

## 2021-10-09 ENCOUNTER — Ambulatory Visit (HOSPITAL_COMMUNITY)
Admission: RE | Admit: 2021-10-09 | Discharge: 2021-10-09 | Disposition: A | Discharge status: Home / Self Care | Payer: Medicare Other | Source: Ambulatory Visit | Attending: Hematology | Admitting: Hematology

## 2021-10-09 ENCOUNTER — Other Ambulatory Visit: Payer: Self-pay

## 2021-10-09 DIAGNOSIS — C221 Intrahepatic bile duct carcinoma: Secondary | ICD-10-CM | POA: Diagnosis present

## 2021-10-09 MED ORDER — IOHEXOL 300 MG/ML  SOLN
100.0000 mL | Freq: Once | INTRAMUSCULAR | Status: AC | PRN
  Administered 2021-10-09: 100 mL via INTRAVENOUS

## 2021-10-13 ENCOUNTER — Encounter (HOSPITAL_COMMUNITY): Payer: Medicare Other | Admitting: Dietician

## 2021-10-14 ENCOUNTER — Inpatient Hospital Stay (HOSPITAL_COMMUNITY): Payer: Medicare Other | Attending: Hematology

## 2021-10-14 ENCOUNTER — Inpatient Hospital Stay (HOSPITAL_BASED_OUTPATIENT_CLINIC_OR_DEPARTMENT_OTHER): Payer: Medicare Other | Admitting: Hematology

## 2021-10-14 ENCOUNTER — Inpatient Hospital Stay (HOSPITAL_COMMUNITY): Payer: Medicare Other

## 2021-10-14 ENCOUNTER — Other Ambulatory Visit: Payer: Self-pay

## 2021-10-14 VITALS — BP 124/74 | HR 88 | Temp 97.0°F | Resp 18

## 2021-10-14 VITALS — BP 121/68 | HR 110 | Temp 98.9°F | Resp 18 | Ht 69.0 in | Wt 150.4 lb

## 2021-10-14 DIAGNOSIS — Z95828 Presence of other vascular implants and grafts: Secondary | ICD-10-CM

## 2021-10-14 DIAGNOSIS — C221 Intrahepatic bile duct carcinoma: Secondary | ICD-10-CM | POA: Diagnosis present

## 2021-10-14 DIAGNOSIS — C779 Secondary and unspecified malignant neoplasm of lymph node, unspecified: Secondary | ICD-10-CM | POA: Insufficient documentation

## 2021-10-14 DIAGNOSIS — I700 Atherosclerosis of aorta: Secondary | ICD-10-CM | POA: Diagnosis not present

## 2021-10-14 DIAGNOSIS — Z5189 Encounter for other specified aftercare: Secondary | ICD-10-CM | POA: Insufficient documentation

## 2021-10-14 DIAGNOSIS — R978 Other abnormal tumor markers: Secondary | ICD-10-CM | POA: Diagnosis not present

## 2021-10-14 DIAGNOSIS — R0602 Shortness of breath: Secondary | ICD-10-CM | POA: Insufficient documentation

## 2021-10-14 DIAGNOSIS — R5383 Other fatigue: Secondary | ICD-10-CM | POA: Diagnosis not present

## 2021-10-14 DIAGNOSIS — I10 Essential (primary) hypertension: Secondary | ICD-10-CM | POA: Diagnosis not present

## 2021-10-14 DIAGNOSIS — E119 Type 2 diabetes mellitus without complications: Secondary | ICD-10-CM | POA: Insufficient documentation

## 2021-10-14 DIAGNOSIS — F17210 Nicotine dependence, cigarettes, uncomplicated: Secondary | ICD-10-CM | POA: Insufficient documentation

## 2021-10-14 DIAGNOSIS — J439 Emphysema, unspecified: Secondary | ICD-10-CM | POA: Diagnosis not present

## 2021-10-14 DIAGNOSIS — Z79899 Other long term (current) drug therapy: Secondary | ICD-10-CM | POA: Insufficient documentation

## 2021-10-14 DIAGNOSIS — Z8249 Family history of ischemic heart disease and other diseases of the circulatory system: Secondary | ICD-10-CM | POA: Insufficient documentation

## 2021-10-14 DIAGNOSIS — Z5111 Encounter for antineoplastic chemotherapy: Secondary | ICD-10-CM | POA: Diagnosis present

## 2021-10-14 DIAGNOSIS — Z5112 Encounter for antineoplastic immunotherapy: Secondary | ICD-10-CM | POA: Insufficient documentation

## 2021-10-14 DIAGNOSIS — E785 Hyperlipidemia, unspecified: Secondary | ICD-10-CM | POA: Diagnosis not present

## 2021-10-14 LAB — COMPREHENSIVE METABOLIC PANEL
ALT: 42 U/L (ref 0–44)
AST: 47 U/L — ABNORMAL HIGH (ref 15–41)
Albumin: 2.8 g/dL — ABNORMAL LOW (ref 3.5–5.0)
Alkaline Phosphatase: 499 U/L — ABNORMAL HIGH (ref 38–126)
Anion gap: 8 (ref 5–15)
BUN: 16 mg/dL (ref 8–23)
CO2: 24 mmol/L (ref 22–32)
Calcium: 8.7 mg/dL — ABNORMAL LOW (ref 8.9–10.3)
Chloride: 100 mmol/L (ref 98–111)
Creatinine, Ser: 0.97 mg/dL (ref 0.61–1.24)
GFR, Estimated: >60 mL/min (ref >60)
Glucose, Bld: 190 mg/dL — ABNORMAL HIGH (ref 70–99)
Potassium: 4.3 mmol/L (ref 3.5–5.1)
Sodium: 132 mmol/L — ABNORMAL LOW (ref 135–145)
Total Bilirubin: 1.1 mg/dL (ref 0.3–1.2)
Total Protein: 6.4 g/dL — ABNORMAL LOW (ref 6.5–8.1)

## 2021-10-14 LAB — CBC WITH DIFFERENTIAL/PLATELET
Abs Immature Granulocytes: 0.18 10*3/uL — ABNORMAL HIGH (ref 0.00–0.07)
Basophils Absolute: 0.1 10*3/uL (ref 0.0–0.1)
Basophils Relative: 1 %
Eosinophils Absolute: 0.1 10*3/uL (ref 0.0–0.5)
Eosinophils Relative: 1 %
HCT: 30.3 % — ABNORMAL LOW (ref 39.0–52.0)
Hemoglobin: 9.9 g/dL — ABNORMAL LOW (ref 13.0–17.0)
Immature Granulocytes: 1 %
Lymphocytes Relative: 11 %
Lymphs Abs: 1.5 10*3/uL (ref 0.7–4.0)
MCH: 32.2 pg (ref 26.0–34.0)
MCHC: 32.7 g/dL (ref 30.0–36.0)
MCV: 98.7 fL (ref 80.0–100.0)
Monocytes Absolute: 1.1 10*3/uL — ABNORMAL HIGH (ref 0.1–1.0)
Monocytes Relative: 8 %
Neutro Abs: 11.1 10*3/uL — ABNORMAL HIGH (ref 1.7–7.7)
Neutrophils Relative %: 78 %
Platelets: 212 10*3/uL (ref 150–400)
RBC: 3.07 MIL/uL — ABNORMAL LOW (ref 4.22–5.81)
RDW: 18.6 % — ABNORMAL HIGH (ref 11.5–15.5)
WBC: 14 10*3/uL — ABNORMAL HIGH (ref 4.0–10.5)
nRBC: 0 % (ref 0.0–0.2)

## 2021-10-14 LAB — MAGNESIUM: Magnesium: 1.6 mg/dL — ABNORMAL LOW (ref 1.7–2.4)

## 2021-10-14 MED ORDER — SODIUM CHLORIDE 0.9 % IV SOLN: Freq: Once | INTRAVENOUS | Status: AC

## 2021-10-14 MED ORDER — PALONOSETRON HCL INJECTION 0.25 MG/5ML
0.2500 mg | Freq: Once | INTRAVENOUS | Status: AC
  Administered 2021-10-14: 0.25 mg via INTRAVENOUS
  Filled 2021-10-14: qty 5

## 2021-10-14 MED ORDER — HEPARIN SOD (PORK) LOCK FLUSH 100 UNIT/ML IV SOLN
500.0000 [IU] | Freq: Once | INTRAVENOUS | Status: AC | PRN
  Administered 2021-10-14: 500 [IU]

## 2021-10-14 MED ORDER — SODIUM CHLORIDE 0.9% FLUSH
10.0000 mL | INTRAVENOUS | Status: DC | PRN
  Administered 2021-10-14: 10 mL

## 2021-10-14 MED ORDER — SODIUM CHLORIDE 0.9 % IV SOLN
1500.0000 mg | Freq: Once | INTRAVENOUS | Status: AC
  Administered 2021-10-14: 1500 mg via INTRAVENOUS
  Filled 2021-10-14: qty 30

## 2021-10-14 MED ORDER — SODIUM CHLORIDE 0.9 % IV SOLN
150.0000 mg | Freq: Once | INTRAVENOUS | Status: AC
  Administered 2021-10-14: 150 mg via INTRAVENOUS
  Filled 2021-10-14: qty 150

## 2021-10-14 MED ORDER — SODIUM CHLORIDE 0.9 % IV SOLN: INTRAVENOUS | Status: DC

## 2021-10-14 MED ORDER — SODIUM CHLORIDE 0.9 % IV SOLN
500.0000 mg/m2 | Freq: Once | INTRAVENOUS | Status: AC
  Administered 2021-10-14: 950 mg via INTRAVENOUS
  Filled 2021-10-14: qty 24.98

## 2021-10-14 MED ORDER — SODIUM CHLORIDE 0.9 % IV SOLN
10.0000 mg | Freq: Once | INTRAVENOUS | Status: AC
  Administered 2021-10-14: 10 mg via INTRAVENOUS
  Filled 2021-10-14: qty 10

## 2021-10-14 MED ORDER — POTASSIUM CHLORIDE IN NACL 20-0.9 MEQ/L-% IV SOLN
Freq: Once | INTRAVENOUS | Status: AC
  Filled 2021-10-14: qty 1000

## 2021-10-14 MED ORDER — MAGNESIUM SULFATE 2 GM/50ML IV SOLN
2.0000 g | Freq: Once | INTRAVENOUS | Status: AC
  Administered 2021-10-14: 2 g via INTRAVENOUS
  Filled 2021-10-14: qty 50

## 2021-10-14 MED ORDER — SODIUM CHLORIDE 0.9 % IV SOLN
25.0000 mg/m2 | Freq: Once | INTRAVENOUS | Status: AC
  Administered 2021-10-14: 48 mg via INTRAVENOUS
  Filled 2021-10-14: qty 48

## 2021-10-14 NOTE — Progress Notes (Signed)
Patient has been examined by Dr. Katragadda, and vital signs and labs have been reviewed. ANC, Creatinine, LFTs, hemoglobin, and platelets are within treatment parameters per M.D. - pt may proceed with treatment.    °

## 2021-10-14 NOTE — Patient Instructions (Signed)
[place] at [facility] ?Discharge Instructions ? ? ?You were seen and examined today by Dr. Delton Coombes. ? ?He reviewed the results of your lab work and CT scan.  All results are normal/stable. ? ?You can try Ginseng (over the counter herbal supplement) to see if it helps improve your energy. ? ?We will proceed with your treatment today. ? ?Return as scheduled.  ? ? ?Thank you for choosing [place] at [facility] to provide your oncology and hematology care.  To afford each patient quality time with our provider, please arrive at least 15 minutes before your scheduled appointment time.  ? ?If you have a lab appointment with the [facility] please come in thru the Main Entrance and check in at the main information desk. ? ?You need to re-schedule your appointment should you arrive 10 or more minutes late.  We strive to give you quality time with our providers, and arriving late affects you and other patients whose appointments are after yours.  Also, if you no show three or more times for appointments you may be dismissed from the clinic at the providers discretion.     ?Again, thank you for choosing [facility].  Our hope is that these requests will decrease the amount of time that you wait before being seen by our physicians.       ?_____________________________________________________________ ? ?Should you have questions after your visit to [facility], please contact our office at [phone] and follow the prompts.  Our office hours are 8:00 a.m. and 4:30 p.m. Monday - Friday.  Please note that voicemails left after 4:00 p.m. may not be returned until the following business day.  We are closed weekends and major holidays.  You do have access to a nurse 24-7, just call the main number to the clinic [phone] and do not press any options, hold on the line and a nurse will answer the phone.   ? ?For prescription refill requests,  have your pharmacy contact our office and allow 72 hours.   ? ?Due to Covid, you will need to wear a mask upon entering the hospital. If you do not have a mask, a mask will be given to you at the Main Entrance upon arrival. For doctor visits, patients may have 1 support person age 85 or older with them. For treatment visits, patients can not have anyone with them due to social distancing guidelines and our immunocompromised population.  ? ?   ?

## 2021-10-14 NOTE — Patient Instructions (Signed)
[facility]  Discharge Instructions: ?Thank you for choosing [facility] to provide your oncology and hematology care.  ?If you have a lab appointment with the [facility], please come in thru the Main Entrance and check in at the main information desk. ? ?Wear comfortable clothing and clothing appropriate for easy access to any Portacath or PICC line.  ? ?We strive to give you quality time with your provider. You may need to reschedule your appointment if you arrive late (15 or more minutes).  Arriving late affects you and other patients whose appointments are after yours.  Also, if you miss three or more appointments without notifying the office, you may be dismissed from the clinic at the provider?s discretion.    ?  ?For prescription refill requests, have your pharmacy contact our office and allow 72 hours for refills to be completed.   ? ?Today you received the following chemotherapy and/or immunotherapy agents Imfinzi/Gemzar/Cisplatin    ?  ?To help prevent nausea and vomiting after your treatment, we encourage you to take your nausea medication as directed. ? ?BELOW ARE SYMPTOMS THAT SHOULD BE REPORTED IMMEDIATELY: ?*FEVER GREATER THAN 100.4 F (38 ?C) OR HIGHER ?*CHILLS OR SWEATING ?*NAUSEA AND VOMITING THAT IS NOT CONTROLLED WITH YOUR NAUSEA MEDICATION ?*UNUSUAL SHORTNESS OF BREATH ?*UNUSUAL BRUISING OR BLEEDING ?*URINARY PROBLEMS (pain or burning when urinating, or frequent urination) ?*BOWEL PROBLEMS (unusual diarrhea, constipation, pain near the anus) ?TENDERNESS IN MOUTH AND THROAT WITH OR WITHOUT PRESENCE OF ULCERS (sore throat, sores in mouth, or a toothache) ?UNUSUAL RASH, SWELLING OR PAIN  ?UNUSUAL VAGINAL DISCHARGE OR ITCHING  ? ?Items with * indicate a potential emergency and should be followed up as soon as possible or go to the Emergency Department if any problems should occur. ? ?Please show the CHEMOTHERAPY ALERT CARD or IMMUNOTHERAPY ALERT CARD at check-in to the  Emergency Department and triage nurse. ? ?Should you have questions after your visit or need to cancel or reschedule your appointment, please contact [facility] [phone]  and follow the prompts.  Office hours are 8:00 a.m. to 4:30 p.m. Monday - Friday. Please note that voicemails left after 4:00 p.m. may not be returned until the following business day.  We are closed weekends and major holidays. You have access to a nurse at all times for urgent questions. Please call the main number to the clinic [phone] and follow the prompts. ? ?For any non-urgent questions, you may also contact your provider using MyChart. We now offer e-Visits for anyone 68 and older to request care online for non-urgent symptoms. For details visit [email or web address]. ?  ?Also download the MyChart app! Go to the app store, search "MyChart", open the app, select [facility], and log in with your MyChart username and password. ? ?Due to Covid, a mask is required upon entering the hospital/clinic. If you do not have a mask, one will be given to you upon arrival. For doctor visits, patients may have 1 support person aged 102 or older with them. For treatment visits, patients cannot have anyone with them due to current Covid guidelines and our immunocompromised population.  ?

## 2021-10-14 NOTE — Progress Notes (Signed)
Patient presents today for Imfinzi/Gemzar/Cisplatin per providers order.  Vital signs and labs within parameters for treatment.   ?Message received from Anastasio Champion RN/Dr. Delton Coombes patient okay for treatment. ? ?Patient ha voided 250 cc. ? ?Imfinzi/Gemzar/Cisplatin given today per MD orders.  Tolerated infusion without adverse affects.  Vital signs stable.  No complaints at this time.  Discharge from clinic via wheelchair in stable condition.  Alert and oriented X 3.  Follow up with [facility] as scheduled.  ? ?

## 2021-10-14 NOTE — Progress Notes (Signed)
[place], [place] [address]   CLINIC:  Medical Oncology/Hematology  PCP:  [name], PA-C [place] / [place] [address] [phone]   REASON FOR VISIT:  Follow-up for intrahepatic cholangiocarcinoma  PRIOR THERAPY: none  NGS Results: not done  CURRENT THERAPY: Cisplatin + Gemcitabine D1,8 q21d  BRIEF ONCOLOGIC HISTORY:  Oncology History  Cholangiocarcinoma ([place])  02/28/2021 Initial Diagnosis   Cholangiocarcinoma ([place])   07/17/2021 Cancer Staging   Staging form: Perihilar Bile Ducts, AJCC 8th Edition - Clinical stage from 07/17/2021: Stage IVB (cTX, cNX, pM1) - Signed by Jonathan Jack, MD on 07/17/2021 Stage prefix: Initial diagnosis    07/22/2021 -  Chemotherapy   Patient is on Treatment Plan : BILIARY TRACT Cisplatin + Gemcitabine D1,8 q21d       CANCER STAGING:  Cancer Staging  Cholangiocarcinoma ([place]) Staging form: Perihilar Bile Ducts, AJCC 8th Edition - Clinical stage from 07/17/2021: Stage IVB (cTX, cNX, pM1) - Signed by Jonathan Jack, MD on 07/17/2021   INTERVAL HISTORY:  [name], a 66 y.o. male, returns for routine follow-up and consideration for next cycle of chemotherapy. Jonathan Keith was last seen on 09/23/2021.  Due for cycle #5 of Cisplatin + Gemcitabine today.   Overall, he tells me he has been feeling pretty well. He reports fatigue. He is eating well. He denies CP, and he reports SOB. He has lost 3 lbs since 2/14. He denies tingling/numbness and tinnitus.   Overall, he feels ready for next cycle of chemo today.   REVIEW OF SYSTEMS:  Review of Systems  Constitutional:  Positive for fatigue and unexpected weight change (-3 lbs). Negative for appetite change.  HENT:   Negative for tinnitus.   Respiratory:  Positive for shortness of breath.   Cardiovascular:  Negative for chest pain.  Neurological:  Negative for numbness.  All other systems reviewed and are  negative.  PAST MEDICAL/SURGICAL HISTORY:  Past Medical History:  Diagnosis Date   Cancer ([place])    Chronic pain    neck, back, knees   Class 1 obesity due to excess calories with body mass index (BMI) of 31.0 to 31.9 in adult    Claudication [facility])    DDD (degenerative disc disease), cervical    Diabetes mellitus ([place])    Dyslipidemia    Hypertension    Port-A-Cath in place 07/20/2021   Tobacco dependence    Past Surgical History:  Procedure Laterality Date   BILIARY BRUSHING N/A 02/25/2021   Procedure: BILIARY BRUSHING;  Surgeon: Rogene Houston, MD;  Location: [facility];  Service: Gastroenterology;  Laterality: N/A;   BILIARY STENT PLACEMENT N/A 02/25/2021   Procedure: BILIARY STENT PLACEMENT;  Surgeon: Rogene Houston, MD;  Location: [facility];  Service: Gastroenterology;  Laterality: N/A;   BILIARY STENT PLACEMENT  02/27/2021   Procedure: BILIARY STENT PLACEMENT (10FR x 9cm) IN THE RIGHT SYSTEM;  Surgeon: Rogene Houston, MD;  Location: [facility];  Service: Gastroenterology;;   BREAST SURGERY Left    benign lump- in his 26s.   ERCP N/A 02/25/2021   Procedure: ENDOSCOPIC RETROGRADE CHOLANGIOPANCREATOGRAPHY (ERCP);  Surgeon: Rogene Houston, MD;  Location: [facility];  Service: Gastroenterology;  Laterality: N/A;   ERCP N/A 02/27/2021   Procedure: ENDOSCOPIC RETROGRADE CHOLANGIOPANCREATOGRAPHY (ERCP);  Surgeon: Rogene Houston, MD;  Location: [facility];  Service: Gastroenterology;  Laterality: N/A;   IR IMAGING GUIDED PORT INSERTION  07/21/2021   KNEE SURGERY Left  x2   LEFT HEART CATH AND CORONARY ANGIOGRAPHY N/A 09/08/2021   Procedure: LEFT HEART CATH AND CORONARY ANGIOGRAPHY;  Surgeon: Early Osmond, MD;  Location: [facility];  Service: Cardiovascular;  Laterality: N/A;   SPHINCTEROTOMY N/A 02/25/2021   Procedure: SPHINCTEROTOMY;  Surgeon: Rogene Houston, MD;  Location: [facility];  Service: Gastroenterology;  Laterality: N/A;   STENT REMOVAL  02/27/2021   Procedure: STENT REMOVAL  (8.5Fr x 9cm);  Surgeon: Rogene Houston, MD;  Location: [facility];  Service: Gastroenterology;;    SOCIAL HISTORY:  Social History   Socioeconomic History   Marital status: Married    Spouse name: Not on file   Number of children: Not on file   Years of education: Not on file   Highest education level: Not on file  Occupational History   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  Tobacco Use   Smoking status: Every Day    Packs/day: 0.50    Years: 56.00    Pack years: 28.00    Types: Cigarettes   Smokeless tobacco: Never  Vaping Use   Vaping Use: Never used  Substance and Sexual Activity   Alcohol use: Not Currently    Comment: stopped 2.5 years ago 03/11/21   Drug use: Never   Sexual activity: Not on file  Other Topics Concern   Not on file  Social History Narrative   Not on file   Social Determinants of Health   Financial Resource Strain: Medium Risk   Difficulty of Paying Living Expenses: Somewhat hard  Food Insecurity: No Food Insecurity   Worried About Charity fundraiser in the Last Year: Never true   [place] in the Last Year: Never true  Transportation Needs: No Transportation Needs   Lack of Transportation (Medical): No   Lack of Transportation (Non-Medical): No  Physical Activity: Insufficiently Active   Days of Exercise per Week: 2 days   Minutes of Exercise per Session: 20 min  Stress: No Stress Concern Present   Feeling of Stress : Only a little  Social Connections: Moderately Integrated   Frequency of Communication with Friends and Family: More than three times a week   Frequency of Social Gatherings with Friends and Family: More than three times a week   Attends Religious Services: More than 4 times per year   Active Member of [employer] or Organizations: No   Attends Music therapist: Never   Marital Status: Married  Human resources officer Violence: Not At Risk   Fear of Current or Ex-Partner: No   Emotionally Abused: No   Physically Abused:  No   Sexually Abused: No    FAMILY HISTORY:  Family History  Problem Relation Age of Onset   CAD Mother    Cancer Neg Hx     CURRENT MEDICATIONS:  Current Outpatient Medications  Medication Sig Dispense Refill   acetaminophen (TYLENOL) 500 MG tablet Take 1,000 mg by mouth every 6 (six) hours as needed for moderate pain or headache.     aspirin EC 81 MG tablet Take 81 mg by mouth as needed (chest pain). Swallow whole.     atorvastatin (LIPITOR) 40 MG tablet Take 1 tablet (40 mg total) by mouth daily at 6 PM. 90 tablet 3   CISPLATIN IV Inject into the vein once a week. Days 1, 8 every 21 days     clopidogrel (PLAVIX) 75 MG tablet Take 1 tablet (75 mg total) by mouth daily with breakfast. 90 tablet  3   Dextromethorphan-guaiFENesin (ROBITUSSIN DM PO) Take 1 Dose by mouth at bedtime as needed (allergy flare up). 2 tsp at night as needed for allergy flare ups     Durvalumab (IMFINZI IV) Inject into the vein every 21 ( twenty-one) days.     famotidine (PEPCID) 20 MG tablet Take 20 mg by mouth daily.     ferrous sulfate (FERROUSUL) 325 (65 FE) MG tablet Take 1 tablet (325 mg total) by mouth daily with breakfast.     Gemcitabine HCl (GEMZAR IV) Inject into the vein once a week. Days 1, 8 every 21 days     lidocaine-prilocaine (EMLA) cream Apply a small amount to port a cath site and cover with plastic wrap 1 hour prior to chemotherapy appointments 30 g 3   loratadine (CLARITIN) 10 MG tablet Take 10 mg by mouth daily. Takes for a few days after shot to boost WBC. About every 2nd treatment     megestrol (MEGACE) 400 MG/10ML suspension Take 10 mLs (400 mg total) by mouth 2 (two) times daily. 480 mL 0   midodrine (PROAMATINE) 5 MG tablet Take 1 tablet (5 mg total) by mouth 3 (three) times daily with meals. 270 tablet 3   nitroGLYCERIN (NITROSTAT) 0.4 MG SL tablet Place 1 tablet (0.4 mg total) under the tongue every 5 (five) minutes as needed for chest pain. 25 tablet 3   pantoprazole (PROTONIX) 40 MG  tablet Take 1 tablet (40 mg total) by mouth daily. 90 tablet 3   prochlorperazine (COMPAZINE) 10 MG tablet Take 1 tablet (10 mg total) by mouth every 6 (six) hours as needed (Nausea or vomiting). (Patient taking differently: Take 10 mg by mouth every 8 (eight) hours as needed for nausea or vomiting (Nausea or vomiting).) 60 tablet 3   tamsulosin (FLOMAX) 0.4 MG CAPS capsule Take 1 capsule (0.4 mg total) by mouth at bedtime. 30 capsule 3   metFORMIN (GLUCOPHAGE) 500 MG tablet Take 1 tablet (500 mg total) by mouth 2 (two) times daily with a meal. 60 tablet 0   No current facility-administered medications for this visit.    ALLERGIES:  No Known Allergies  PHYSICAL EXAM:  Performance status (ECOG): 0 - Asymptomatic  Vitals:   10/14/21 0806  BP: 121/68  Pulse: (!) 110  Resp: 18  Temp: 98.9 F (37.2 C)  SpO2: 100%   Wt Readings from Last 3 Encounters:  10/14/21 150 lb 6.4 oz (68.2 kg)  09/30/21 151 lb 12.8 oz (68.9 kg)  09/23/21 153 lb (69.4 kg)   Physical Exam Vitals reviewed.  Constitutional:      Appearance: Normal appearance.  Cardiovascular:     Rate and Rhythm: Normal rate and regular rhythm.     Pulses: Normal pulses.     Heart sounds: Normal heart sounds.  Pulmonary:     Effort: Pulmonary effort is normal.     Breath sounds: Normal breath sounds.  Musculoskeletal:     Right lower leg: No edema.     Left lower leg: No edema.  Neurological:     General: No focal deficit present.     Mental Status: He is alert and oriented to person, place, and time.  Psychiatric:        Mood and Affect: Mood normal.        Behavior: Behavior normal.    LABORATORY DATA:  I have reviewed the labs as listed.  CBC Latest Ref Rng & Units 10/14/2021 09/30/2021 09/23/2021  WBC 4.0 - 10.5 K/uL  14.0(H) 5.6 9.5  Hemoglobin 13.0 - 17.0 g/dL 9.9(L) 9.9(L) 10.5(L)  Hematocrit 39.0 - 52.0 % 30.3(L) 29.7(L) 31.6(L)  Platelets 150 - 400 K/uL 212 141(L) 341   CMP Latest Ref Rng & Units 10/14/2021  09/30/2021 09/23/2021  Glucose 70 - 99 mg/dL 190(H) 162(H) 156(H)  BUN 8 - 23 mg/dL _0 Creatinine 0.61 - 1.24 mg/dL 0.97 0.89 0.89  Sodium 135 - 145 mmol/L 132(L) 130(L) 132(L)  Potassium 3.5 - 5.1 mmol/L 4.3 4.2 4.0  Chloride 98 - 111 mmol/L 100 98 100  CO2 22 - 32 mmol/L 24 25 20(L)  Calcium 8.9 - 10.3 mg/dL 8.7(L) 8.9 9.0  Total Protein 6.5 - 8.1 g/dL 6.4(L) 6.7 7.0  Total Bilirubin 0.3 - 1.2 mg/dL 1.1 1.0 1.0  Alkaline Phos 38 - 126 U/L 499(H) 428(H) 450(H)  AST 15 - 41 U/L 47(H) 48(H) 46(H)  ALT 0 - 44 U/L 42 59(H) 53(H)    DIAGNOSTIC IMAGING:  I have independently reviewed the scans and discussed with the patient. CT CHEST ABDOMEN PELVIS W CONTRAST  Result Date: 10/10/2021 CLINICAL DATA:  Cholangiocarcinoma staging, chemotherapy initiated 1 month ago EXAM: CT CHEST, ABDOMEN, AND PELVIS WITH CONTRAST TECHNIQUE: Multidetector CT imaging of the chest, abdomen and pelvis was performed following the standard protocol during bolus administration of intravenous contrast. RADIATION DOSE REDUCTION: This exam was performed according to the departmental dose-optimization program which includes automated exposure control, adjustment of the mA and/or kV according to patient size and/or use of iterative reconstruction technique. CONTRAST:  152m OMNIPAQUE IOHEXOL 300 MG/ML SOLN, additional oral enteric contrast COMPARISON:  CT chest angiogram, 09/08/2021, CT abdomen pelvis, 05/27/2021 FINDINGS: CT CHEST FINDINGS Cardiovascular: Right chest port catheter. Aortic atherosclerosis. New, aneurysmal appearance of the inferior base of the left ventricle (series 2, image 48, series 4, image 69). Three-vessel coronary artery calcifications. No pericardial effusion. Mediastinum/Nodes: No enlarged mediastinal, hilar, or axillary lymph nodes. Thyroid gland, trachea, and esophagus demonstrate no significant findings. Lungs/Pleura: Mild centrilobular emphysema. Background of very fine centrilobular pulmonary  nodules, concentrated in the lung apices. Diffuse bilateral bronchial wall thickening. Unchanged clustered small pulmonary nodules of the superior segment right lower lobe measuring no greater than 0.3 cm, benign and incidental (series 3, image 90, 94). No pleural effusion or pneumothorax. Musculoskeletal: No chest wall mass or suspicious osseous lesions identified. CT ABDOMEN PELVIS FINDINGS Hepatobiliary: Redemonstrated dense metallic coil and or cement within the right lobe of the liver, streak artifact related to which obscures evaluation of the liver parenchyma. Interval placement of common bile duct stent. Unchanged intrahepatic biliary ductal dilatation, with multiple foci of segmental biliary ductal dilatation, most conspicuously in the left lobe (series 2, image 58). Unchanged configuration of the gallbladder. Pancreas: Unremarkable. No pancreatic ductal dilatation or surrounding inflammatory changes. Spleen: Normal in size without significant abnormality. Adrenals/Urinary Tract: Adrenal glands are unremarkable. Kidneys are normal, without renal calculi, solid lesion, or hydronephrosis. 0.8 cm calculus in the posterior right bladder (series 2, image 113). Stomach/Bowel: Stomach is within normal limits. Appendix appears normal. No evidence of bowel wall thickening, distention, or inflammatory changes. Sigmoid diverticula. Vascular/Lymphatic: Aortic atherosclerosis. No enlarged abdominal or pelvic lymph nodes. Reproductive: Mild prostatomegaly with median lobe hypertrophy. Other: No abdominal wall hernia or abnormality. No ascites. Musculoskeletal: No acute osseous findings. IMPRESSION: 1. Unchanged intrahepatic biliary ductal dilatation, with multiple foci of segmental biliary ductal dilatation. Patient's reported cholangiocarcinoma is not directly visualized. 2. Interval placement of common bile duct stent. 3. Redemonstrated dense metallic coil  and or cement within the right lobe of the liver, streak  artifact related to which obscures evaluation of the liver parenchyma. 4. No evidence of lymphadenopathy or metastatic disease in the chest, abdomen, or pelvis. 5. Mild emphysema and diffuse bilateral bronchial wall thickening. Background of fine centrilobular nodularity, concentrated in the lung apices, most commonly seen in smoking-related respiratory bronchiolitis. 6. Incidental note of new, aneurysmal appearance of the inferior base of the left ventricle. Correlate with echocardiographic findings. 7. Coronary artery disease. 8. Bladder calculus measuring 0.8 cm. These results will be called to the ordering clinician or representative by the Radiologist Assistant, and communication documented in the PACS or [employer]. Aortic Atherosclerosis (ICD10-I70.0) and Emphysema (ICD10-J43.9). Electronically Signed   By: Delanna Ahmadi M.D.   On: 10/10/2021 15:44     ASSESSMENT:  Metastatic cholangiocarcinoma: - 02/2021 initial diagnosis presentation with painless jaundice - ERCP with sphincterotomy, subsequent ERCP with plastic biliary stent placement. - MRI-2 cm mass involving the common hepatic duct with upstream biliary dilatation. - 03/2021 consult at [facility] with GI and surgery-confirm diagnosis of perihilar cholangiocarcinoma - 03/21/2021-biopsy/pathology, ERCP-common hepatic duct cytology and brushings performed.  EUS-1 enlarged lymph node versus tumor implant in the gastrohepatic ligament-negative for malignancy.  Single biliary plastic stent removed and replaced with the plastic stent in the right and left ducts. - 04/17/2021-right portal vein embolization to promote left hepatic hypertrophy. - 07/02/2021-diagnostic laparoscopy with multiple areas concerning for metastatic blocks identified in the abdomen and peritoneal surfaces.  6 separate biopsies obtained. - Pathology consistent with metastatic carcinoma. - 37 pound weight loss in the last 5 months. - Cycle 1 of gemcitabine, cisplatin, durvalumab  on 07/22/2021.  2.  Social history/family: - Lives at home with his wife.  He worked as a Development worker, community. - Current active smoker, 1 pack/day since age 45. - Maternal grandmother had cancer.  Maternal uncle had brain tumor.     PLAN:  Metastatic cholangiocarcinoma: - He has tolerated dose reduced cycle 4 very well. - Denies any tingling or numbness or ringing in the ears.  He is eating well.  No chest pains reported. - Reviewed CT CAP from 10/09/2021.  Unchanged intrahepatic biliary ductal dilatation.  No evidence of lymphadenopathy or metastatic disease in the chest, abdomen or pelvis. - Tumor marker CA 19-9 has improved to 218. - Overall the findings indicate he is having response. - Reviewed labs today which showed normal platelet count.  Hemoglobin is 9.9 and white count is 14.  LFTs show mildly elevated AST of 47 and alk phos of 499.  Creatinine is normal. - Proceed with cycle 5 today with dose reduction of gemcitabine to 500 mg/m2.  RTC follow-up in 3 weeks.  2.  Weight loss: - Continue Compazine 3 times daily.  Nausea is well controlled.  3.  Cancer fatigue: - Ritalin was discontinued after heart attack.  I have encouraged him to be physically active.  4.  Urinary problems: - Continue Flomax 0.4 mg at bedtime.  5.  Hypotension: - Continue midodrine 5 mg 3 times daily.  Systolic blood pressure is 120.   Orders placed this encounter:  No orders of the defined types were placed in this encounter.    Jonathan Jack, MD [place] [phone]   I, Thana Ates, am acting as a scribe for Dr. Derek Keith.  I, Jonathan Jack MD, have reviewed the above documentation for accuracy and completeness, and I agree with the above.

## 2021-10-15 LAB — CANCER ANTIGEN 19-9: CA 19-9: 382 U/mL — ABNORMAL HIGH (ref 0–35)

## 2021-10-21 ENCOUNTER — Inpatient Hospital Stay (HOSPITAL_COMMUNITY): Payer: Medicare Other

## 2021-10-21 VITALS — BP 123/75 | HR 81 | Temp 97.2°F | Resp 18

## 2021-10-21 DIAGNOSIS — C221 Intrahepatic bile duct carcinoma: Secondary | ICD-10-CM

## 2021-10-21 DIAGNOSIS — Z5112 Encounter for antineoplastic immunotherapy: Secondary | ICD-10-CM | POA: Diagnosis not present

## 2021-10-21 DIAGNOSIS — Z95828 Presence of other vascular implants and grafts: Secondary | ICD-10-CM

## 2021-10-21 LAB — MAGNESIUM: Magnesium: 1.7 mg/dL (ref 1.7–2.4)

## 2021-10-21 LAB — COMPREHENSIVE METABOLIC PANEL
ALT: 68 U/L — ABNORMAL HIGH (ref 0–44)
AST: 61 U/L — ABNORMAL HIGH (ref 15–41)
Albumin: 2.9 g/dL — ABNORMAL LOW (ref 3.5–5.0)
Alkaline Phosphatase: 461 U/L — ABNORMAL HIGH (ref 38–126)
Anion gap: 9 (ref 5–15)
BUN: 15 mg/dL (ref 8–23)
CO2: 23 mmol/L (ref 22–32)
Calcium: 8.6 mg/dL — ABNORMAL LOW (ref 8.9–10.3)
Chloride: 99 mmol/L (ref 98–111)
Creatinine, Ser: 0.88 mg/dL (ref 0.61–1.24)
GFR, Estimated: >60 mL/min (ref >60)
Glucose, Bld: 207 mg/dL — ABNORMAL HIGH (ref 70–99)
Potassium: 4.4 mmol/L (ref 3.5–5.1)
Sodium: 131 mmol/L — ABNORMAL LOW (ref 135–145)
Total Bilirubin: 1.4 mg/dL — ABNORMAL HIGH (ref 0.3–1.2)
Total Protein: 6.6 g/dL (ref 6.5–8.1)

## 2021-10-21 LAB — CBC WITH DIFFERENTIAL/PLATELET
Abs Immature Granulocytes: 0.04 10*3/uL (ref 0.00–0.07)
Basophils Absolute: 0.1 10*3/uL (ref 0.0–0.1)
Basophils Relative: 1 %
Eosinophils Absolute: 0 10*3/uL (ref 0.0–0.5)
Eosinophils Relative: 0 %
HCT: 28.4 % — ABNORMAL LOW (ref 39.0–52.0)
Hemoglobin: 9.3 g/dL — ABNORMAL LOW (ref 13.0–17.0)
Immature Granulocytes: 1 %
Lymphocytes Relative: 11 %
Lymphs Abs: 0.8 10*3/uL (ref 0.7–4.0)
MCH: 32.5 pg (ref 26.0–34.0)
MCHC: 32.7 g/dL (ref 30.0–36.0)
MCV: 99.3 fL (ref 80.0–100.0)
Monocytes Absolute: 0.8 10*3/uL (ref 0.1–1.0)
Monocytes Relative: 11 %
Neutro Abs: 6.2 10*3/uL (ref 1.7–7.7)
Neutrophils Relative %: 76 %
Platelets: 126 10*3/uL — ABNORMAL LOW (ref 150–400)
RBC: 2.86 MIL/uL — ABNORMAL LOW (ref 4.22–5.81)
RDW: 18 % — ABNORMAL HIGH (ref 11.5–15.5)
WBC: 8 10*3/uL (ref 4.0–10.5)
nRBC: 0 % (ref 0.0–0.2)

## 2021-10-21 MED ORDER — SODIUM CHLORIDE 0.9% FLUSH
10.0000 mL | INTRAVENOUS | Status: DC | PRN
  Administered 2021-10-21: 10 mL

## 2021-10-21 MED ORDER — SODIUM CHLORIDE 0.9 % IV SOLN
25.0000 mg/m2 | Freq: Once | INTRAVENOUS | Status: AC
  Administered 2021-10-21: 48 mg via INTRAVENOUS
  Filled 2021-10-21: qty 48

## 2021-10-21 MED ORDER — PALONOSETRON HCL INJECTION 0.25 MG/5ML
0.2500 mg | Freq: Once | INTRAVENOUS | Status: AC
  Administered 2021-10-21: 0.25 mg via INTRAVENOUS
  Filled 2021-10-21: qty 5

## 2021-10-21 MED ORDER — HEPARIN SOD (PORK) LOCK FLUSH 100 UNIT/ML IV SOLN
500.0000 [IU] | Freq: Once | INTRAVENOUS | Status: AC | PRN
  Administered 2021-10-21: 500 [IU]

## 2021-10-21 MED ORDER — SODIUM CHLORIDE 0.9 % IV SOLN: Freq: Once | INTRAVENOUS | Status: AC

## 2021-10-21 MED ORDER — SODIUM CHLORIDE 0.9 % IV SOLN
150.0000 mg | Freq: Once | INTRAVENOUS | Status: AC
  Administered 2021-10-21: 150 mg via INTRAVENOUS
  Filled 2021-10-21: qty 5

## 2021-10-21 MED ORDER — MAGNESIUM SULFATE 2 GM/50ML IV SOLN
2.0000 g | Freq: Once | INTRAVENOUS | Status: AC
  Administered 2021-10-21: 2 g via INTRAVENOUS
  Filled 2021-10-21: qty 50

## 2021-10-21 MED ORDER — SODIUM CHLORIDE 0.9 % IV SOLN
10.0000 mg | Freq: Once | INTRAVENOUS | Status: AC
  Administered 2021-10-21: 10 mg via INTRAVENOUS
  Filled 2021-10-21: qty 10

## 2021-10-21 MED ORDER — SODIUM CHLORIDE 0.9 % IV SOLN
500.0000 mg/m2 | Freq: Once | INTRAVENOUS | Status: AC
  Administered 2021-10-21: 950 mg via INTRAVENOUS
  Filled 2021-10-21: qty 24.98

## 2021-10-21 MED ORDER — POTASSIUM CHLORIDE IN NACL 20-0.9 MEQ/L-% IV SOLN
Freq: Once | INTRAVENOUS | Status: AC
  Filled 2021-10-21: qty 1000

## 2021-10-21 NOTE — Patient Instructions (Signed)
[facility]  Discharge Instructions: Thank you for choosing [facility] to provide your oncology and hematology care.  If you have a lab appointment with the Cancer Center, please come in thru the Main Entrance and check in at the main information desk.  Wear comfortable clothing and clothing appropriate for easy access to any Portacath or PICC line.   We strive to give you quality time with your provider. You may need to reschedule your appointment if you arrive late (15 or more minutes).  Arriving late affects you and other patients whose appointments are after yours.  Also, if you miss three or more appointments without notifying the office, you may be dismissed from the clinic at the provider's discretion.      For prescription refill requests, have your pharmacy contact our office and allow 72 hours for refills to be completed.        To help prevent nausea and vomiting after your treatment, we encourage you to take your nausea medication as directed.  BELOW ARE SYMPTOMS THAT SHOULD BE REPORTED IMMEDIATELY: *FEVER GREATER THAN 100.4 F (38 C) OR HIGHER *CHILLS OR SWEATING *NAUSEA AND VOMITING THAT IS NOT CONTROLLED WITH YOUR NAUSEA MEDICATION *UNUSUAL SHORTNESS OF BREATH *UNUSUAL BRUISING OR BLEEDING *URINARY PROBLEMS (pain or burning when urinating, or frequent urination) *BOWEL PROBLEMS (unusual diarrhea, constipation, pain near the anus) TENDERNESS IN MOUTH AND THROAT WITH OR WITHOUT PRESENCE OF ULCERS (sore throat, sores in mouth, or a toothache) UNUSUAL RASH, SWELLING OR PAIN  UNUSUAL VAGINAL DISCHARGE OR ITCHING   Items with * indicate a potential emergency and should be followed up as soon as possible or go to the Emergency Department if any problems should occur.  Please show the CHEMOTHERAPY ALERT CARD or IMMUNOTHERAPY ALERT CARD at check-in to the Emergency Department and triage nurse.  Should you have questions after your visit or need to cancel  or reschedule your appointment, please contact [facility] [phone]  and follow the prompts.  Office hours are 8:00 a.m. to 4:30 p.m. Monday - Friday. Please note that voicemails left after 4:00 p.m. may not be returned until the following business day.  We are closed weekends and major holidays. You have access to a nurse at all times for urgent questions. Please call the main number to the clinic [phone] and follow the prompts.  For any non-urgent questions, you may also contact your provider using MyChart. We now offer e-Visits for anyone 18 and older to request care online for non-urgent symptoms. For details visit [email or web address].   Also download the MyChart app! Go to the app store, search "MyChart", open the app, select [facility], and log in with your MyChart username and password.  Due to Covid, a mask is required upon entering the hospital/clinic. If you do not have a mask, one will be given to you upon arrival. For doctor visits, patients may have 1 support person aged 18 or older with them. For treatment visits, patients cannot have anyone with them due to current Covid guidelines and our immunocompromised population.  

## 2021-10-21 NOTE — Progress Notes (Signed)
Labs reviewed with MD today. Will proceed with treatment today per MD.   Treatment given per orders. Patient tolerated it well without problems. Vitals stable and discharged home from clinic ambulatory. Follow up as scheduled.  

## 2021-10-22 ENCOUNTER — Ambulatory Visit (HOSPITAL_COMMUNITY): Payer: Medicare Other

## 2021-10-23 ENCOUNTER — Inpatient Hospital Stay (HOSPITAL_COMMUNITY): Payer: Medicare Other

## 2021-10-23 ENCOUNTER — Ambulatory Visit (HOSPITAL_COMMUNITY): Payer: Medicare Other

## 2021-10-23 ENCOUNTER — Encounter (HOSPITAL_COMMUNITY): Payer: Self-pay

## 2021-10-23 ENCOUNTER — Other Ambulatory Visit: Payer: Self-pay

## 2021-10-23 ENCOUNTER — Inpatient Hospital Stay (HOSPITAL_COMMUNITY): Payer: Medicare Other | Admitting: Dietician

## 2021-10-23 VITALS — BP 111/66 | HR 100 | Temp 98.3°F | Resp 18 | Wt 158.0 lb

## 2021-10-23 DIAGNOSIS — Z5112 Encounter for antineoplastic immunotherapy: Secondary | ICD-10-CM | POA: Diagnosis not present

## 2021-10-23 MED ORDER — PEGFILGRASTIM-CBQV 6 MG/0.6ML ~~LOC~~ SOSY
6.0000 mg | PREFILLED_SYRINGE | Freq: Once | SUBCUTANEOUS | Status: AC
  Administered 2021-10-23: 6 mg via SUBCUTANEOUS
  Filled 2021-10-23: qty 0.6

## 2021-10-23 NOTE — Progress Notes (Signed)
Nutrition Assessment ? ? ?Reason for Assessment: poor appetite, altered taste ? ? ?ASSESSMENT: 66 year old male with intrahepatic cholangiocarcinoma. He is currently receiving cisplatin + gemcitabine + durvalumab q21d. Patient is under the care of Dr. Delton Coombes.  ? ?Past medical history includes HTN, DM2, ACS, NSTEMI, GERD, dyslipidemia, diverticulitis ? ?Met with patient and wife in clinic. Wife reports he has good days and bad days with appetite. He attempts 3 meals + snacks. Patient reports wife puts too much food on his plate. Yesterday was a good day. Patient ate 2 pieces of pepperoni pizza, 2 fried apple pies, bowl of cereal with 2% milk, strawberries. On a bad day patient does not want to eat at all, but usually will at least have a bowl of cereal with strawberries and snack on apples. He snacks on strawberries and apples everyday in between meals. Patient reports altered taste. He no longer cares for bread, potatoes, tomatoes, peanut butter, eggs, cheese (occasionally he will want cheese) Patient eating little amounts of meat. He is a "picky eater" at baseline. Patient does not like any pastas, oatmeal, grits, yogurt, creamy soups. Patient will not drink nutrition supplements, he does not like these. Patient denies nausea, vomiting, diarrhea, constipation. He is brushing his teeth once daily.  ? ?Nutrition Focused Physical Exam: deferred ? ? ?Medications: lipitor, ferrous sulfate, plavix, megace, metformin, protonix, compazine, proamatine ? ? ?Labs: 3/14 - Na 131, Glucose 207 ?Last HgbA1c on 09/08/21 - 5.8 ? ?Anthropometrics:  ? ?Height: 5'10" ?Weight: 158 lb  ?UBW: 189 lb 6 oz (02/24/21) ?BMI: 22.62 ? ? ? ?NUTRITION DIAGNOSIS: Unintentional weight loss related to intrahepatic cholangiocarcinoma and associated side effects of chemotherapy as evidenced by reported altered taste of foods, dietary recall low in calories and protein, 16% (31 lb) weight loss in 8 months which is significant for time frame.   ? ? ?INTERVENTION:  ?Educated on strategies for poor appetite, encouraged eating small amounts more frequently vs larger plates at meal times ?Suggested pt try having fruit on same plate with other foods vs just as a snack to encourage intake of high calorie/protein foods - handout with tips for altered taste provided ?Offered ideas on high calorie/protein snacks and provided handout with recipes ?Educated on ways to add calories and protein to foods (switching to whole milk/fortified milk, adding cheese, cooking with butter, adding gravy, using sweet sauces on meats) ?Discussed importance of oral care and recommend pt try baking soda salt water rinses several times daily - handout with recipe provided  ?Suggested pt try CIB mixed with whole milk as oral nutrition supplement - sample packets provided  ? ? ?MONITORING, EVALUATION, GOAL: Patient will tolerate increased calories and protein to minimize further weight loss  ? ? ?Next Visit: Thursday April 6 during injection ? ? ? ? ? ? ?

## 2021-10-23 NOTE — Progress Notes (Signed)
Patient tolerated injection with no complaints voiced. Site clean and dry with no bruising or swelling noted at site. See MAR for details. Band aid applied.  Patient stable during and after injection. VSS with discharge and left in satisfactory condition with no s/s of distress noted.  

## 2021-10-23 NOTE — Patient Instructions (Signed)
[place]  Discharge Instructions: ?Thank you for choosing [facility] to provide your oncology and hematology care.  ?If you have a lab appointment with the [facility], please come in thru the Main Entrance and check in at the main information desk. ? ?Wear comfortable clothing and clothing appropriate for easy access to any Portacath or PICC line.  ? ?We strive to give you quality time with your provider. You may need to reschedule your appointment if you arrive late (15 or more minutes).  Arriving late affects you and other patients whose appointments are after yours.  Also, if you miss three or more appointments without notifying the office, you may be dismissed from the clinic at the provider?s discretion.    ?  ?For prescription refill requests, have your pharmacy contact our office and allow 72 hours for refills to be completed.   ? ?Today you received the following Udenyca injection, return as scheduled. ?  ?To help prevent nausea and vomiting after your treatment, we encourage you to take your nausea medication as directed. ? ?BELOW ARE SYMPTOMS THAT SHOULD BE REPORTED IMMEDIATELY: ?*FEVER GREATER THAN 100.4 F (38 ?C) OR HIGHER ?*CHILLS OR SWEATING ?*NAUSEA AND VOMITING THAT IS NOT CONTROLLED WITH YOUR NAUSEA MEDICATION ?*UNUSUAL SHORTNESS OF BREATH ?*UNUSUAL BRUISING OR BLEEDING ?*URINARY PROBLEMS (pain or burning when urinating, or frequent urination) ?*BOWEL PROBLEMS (unusual diarrhea, constipation, pain near the anus) ?TENDERNESS IN MOUTH AND THROAT WITH OR WITHOUT PRESENCE OF ULCERS (sore throat, sores in mouth, or a toothache) ?UNUSUAL RASH, SWELLING OR PAIN  ?UNUSUAL VAGINAL DISCHARGE OR ITCHING  ? ?Items with * indicate a potential emergency and should be followed up as soon as possible or go to the Emergency Department if any problems should occur. ? ?Please show the CHEMOTHERAPY ALERT CARD or IMMUNOTHERAPY ALERT CARD at check-in to the Emergency Department and  triage nurse. ? ?Should you have questions after your visit or need to cancel or reschedule your appointment, please contact [facility] [phone]  and follow the prompts.  Office hours are 8:00 a.m. to 4:30 p.m. Monday - Friday. Please note that voicemails left after 4:00 p.m. may not be returned until the following business day.  We are closed weekends and major holidays. You have access to a nurse at all times for urgent questions. Please call the main number to the clinic [phone] and follow the prompts. ? ?For any non-urgent questions, you may also contact your provider using MyChart. We now offer e-Visits for anyone 24 and older to request care online for non-urgent symptoms. For details visit [email or web address]. ?  ?Also download the MyChart app! Go to the app store, search "MyChart", open the app, select [facility], and log in with your MyChart username and password. ? ?Due to Covid, a mask is required upon entering the hospital/clinic. If you do not have a mask, one will be given to you upon arrival. For doctor visits, patients may have 1 support person aged 66 or older with them. For treatment visits, patients cannot have anyone with them due to current Covid guidelines and our immunocompromised population.  ?

## 2021-11-03 NOTE — Progress Notes (Signed)
?Cardiology Office Note:   ?Date:  11/05/2021  ?NAME:  Jonathan Keith    ?MRN: [id number] ?DOB:  1956/04/07  ? ?PCP:  [name], PA-C  ?Cardiologist:  Freada Bergeron, MD  ?Electrophysiologist:  None  ? ?Referring MD: [name], PA-C  ? ?Chief Complaint  ?Patient presents with  ? Follow-up  ? ?History of Present Illness:   ?Jonathan Keith is a 66 y.o. male with a hx of systolic heart failure, three-vessel CAD managed medically in the setting of advanced malignancy, metastatic cholangiocarcinoma, diabetes, anemia who presents for follow-up.  Admitted to the hospital in February 2023 with non-STEMI.  Found to have severe three-vessel CAD.  Not a candidate for revascularization given advanced malignancy. ? ?He reports he is doing well since leaving the hospital.  Denies any chest pain or trouble breathing.  Only taking Plavix.  He is on a statin.  Blood pressure is improved.  Apparently when he was in the hospital he was having hypotensive episodes.  This seems to have resolved.  He has metastatic cholangiocarcinoma.  Currently on palliative chemotherapy.  Suffers from chronic anemia.  Seems to be doing well.  Returned to work.  Works 2 days/week.  Works at a [employer]. ? ?No symptoms of angina.  We discussed the results of his recent testing including heart catheterization.  I have recommended medical therapy.  They are in agreement.  I do not believe aggressive intervention is indicated.  Surprisingly he continues to do well.  He is wearing compression stockings.  Describes no weight gain or lower extremity edema. ? ?Problem List ?Systolic HF ?-EF 24-23% ?CAD ?-NSTEMI 09/2021 ?-3v CAD managed medically in setting of advanced cancer  ?3. Metastatic cholangiocarcinoma  ?4. HLD ?5. DM ?-A1c 5.8 ?6. Anemia  ? ?Past Medical History: ?Past Medical History:  ?Diagnosis Date  ? Cancer [facility])   ? CHF (congestive heart failure) ([place])   ? Chronic pain   ? neck, back, knees  ? Class 1 obesity due to excess  calories with body mass index (BMI) of 31.0 to 31.9 in adult   ? Claudication [facility])   ? Coronary artery disease   ? DDD (degenerative disc disease), cervical   ? Diabetes mellitus ([place])   ? Dyslipidemia   ? Hypertension   ? Port-A-Cath in place 07/20/2021  ? Tobacco dependence   ? ? ?Past Surgical History: ?Past Surgical History:  ?Procedure Laterality Date  ? BILIARY BRUSHING N/A 02/25/2021  ? Procedure: BILIARY BRUSHING;  Surgeon: Rogene Houston, MD;  Location: [facility];  Service: Gastroenterology;  Laterality: N/A;  ? BILIARY STENT PLACEMENT N/A 02/25/2021  ? Procedure: BILIARY STENT PLACEMENT;  Surgeon: Rogene Houston, MD;  Location: [facility]S;  Service: Gastroenterology;  Laterality: N/A;  ? BILIARY STENT PLACEMENT  02/27/2021  ? Procedure: BILIARY STENT PLACEMENT (10FR x 9cm) IN THE RIGHT SYSTEM;  Surgeon: Rogene Houston, MD;  Location: [facility]S;  Service: Gastroenterology;;  ? BREAST SURGERY Left   ? benign lump- in his 45s.  ? CARDIAC CATHETERIZATION    ? ERCP N/A 02/25/2021  ? Procedure: ENDOSCOPIC RETROGRADE CHOLANGIOPANCREATOGRAPHY (ERCP);  Surgeon: Rogene Houston, MD;  Location: [facility];  Service: Gastroenterology;  Laterality: N/A;  ? ERCP N/A 02/27/2021  ? Procedure: ENDOSCOPIC RETROGRADE CHOLANGIOPANCREATOGRAPHY (ERCP);  Surgeon: Rogene Houston, MD;  Location: [facility];  Service: Gastroenterology;  Laterality: N/A;  ? IR IMAGING GUIDED PORT INSERTION  07/21/2021  ? KNEE SURGERY Left   ?  x2  ? LEFT HEART CATH AND CORONARY ANGIOGRAPHY N/A 09/08/2021  ? Procedure: LEFT HEART CATH AND CORONARY ANGIOGRAPHY;  Surgeon: Early Osmond, MD;  Location: [facility] CV LAB;  Service: Cardiovascular;  Laterality: N/A;  ? SPHINCTEROTOMY N/A 02/25/2021  ? Procedure: SPHINCTEROTOMY;  Surgeon: Rogene Houston, MD;  Location: [facility];  Service: Gastroenterology;  Laterality: N/A;  ? STENT REMOVAL  02/27/2021  ? Procedure: STENT REMOVAL (8.5Fr x 9cm);  Surgeon: Rogene Houston, MD;  Location: [facility];  Service:  Gastroenterology;;  ? ? ?Current Medications: ?Current Meds  ?Medication Sig  ? acetaminophen (TYLENOL) 500 MG tablet Take 1,000 mg by mouth every 6 (six) hours as needed for moderate pain or headache.  ? atorvastatin (LIPITOR) 40 MG tablet Take 1 tablet (40 mg total) by mouth daily at 6 PM.  ? CISPLATIN IV Inject into the vein once a week. Days 1, 8 every 21 days  ? clopidogrel (PLAVIX) 75 MG tablet Take 1 tablet (75 mg total) by mouth daily with breakfast.  ? Durvalumab (IMFINZI IV) Inject into the vein every 21 ( twenty-one) days.  ? famotidine (PEPCID) 20 MG tablet Take 20 mg by mouth daily.  ? ferrous sulfate (FERROUSUL) 325 (65 FE) MG tablet Take 1 tablet (325 mg total) by mouth daily with breakfast.  ? furosemide (LASIX) 20 MG tablet Take 20 mg daily as needed for swelling  ? Gemcitabine HCl (GEMZAR IV) Inject into the vein once a week. Days 1, 8 every 21 days  ? lidocaine-prilocaine (EMLA) cream Apply a small amount to port a cath site and cover with plastic wrap 1 hour prior to chemotherapy appointments  ? loratadine (CLARITIN) 10 MG tablet Take 10 mg by mouth daily. Takes for a few days after shot to boost WBC. About every 2nd treatment  ? metFORMIN (GLUCOPHAGE) 500 MG tablet Take 1 tablet (500 mg total) by mouth 2 (two) times daily with a meal.  ? midodrine (PROAMATINE) 5 MG tablet Take 1 tablet (5 mg total) by mouth 3 (three) times daily with meals.  ? nitroGLYCERIN (NITROSTAT) 0.4 MG SL tablet Place 1 tablet (0.4 mg total) under the tongue every 5 (five) minutes as needed for chest pain.  ? pantoprazole (PROTONIX) 40 MG tablet Take 1 tablet (40 mg total) by mouth daily.  ? prochlorperazine (COMPAZINE) 10 MG tablet Take 1 tablet (10 mg total) by mouth every 6 (six) hours as needed (Nausea or vomiting).  ? tamsulosin (FLOMAX) 0.4 MG CAPS capsule Take 1 capsule (0.4 mg total) by mouth at bedtime.  ?  ? ?Allergies:    ?Patient has no known allergies.  ? ?Social History: ?Social History  ? ?Socioeconomic  History  ? Marital status: Married  ?  Spouse name: Not on file  ? Number of children: Not on file  ? Years of education: Not on file  ? Highest education level: Not on file  ?Occupational History  ? 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 Smoking status: Every Day  ?  Packs/day: 0.50  ?  Years: 56.00  ?  Pack years: 28.00  ?  Types: Cigarettes  ? Smokeless tobacco: Never  ?Vaping Use  ? Vaping Use: Never used  ?Substance and Sexual Activity  ? Alcohol use: Not Currently  ?  Comment: stopped 2.5 years ago 03/11/21  ? Drug use: Never  ? Sexual activity: Not on file  ?Other Topics Concern  ? Not on file  ?Social History Narrative  ?  Not on file  ? ?Social Determinants of Health  ? ?Financial Resource Strain: Medium Risk  ? Difficulty of Paying Living Expenses: Somewhat hard  ?Food Insecurity: No Food Insecurity  ? Worried About Charity fundraiser in the Last Year: Never true  ? Ran Out of Food in the Last Year: Never true  ?Transportation Needs: No Transportation Needs  ? Lack of Transportation (Medical): No  ? Lack of Transportation (Non-Medical): No  ?Physical Activity: Insufficiently Active  ? Days of Exercise per Week: 2 days  ? Minutes of Exercise per Session: 20 min  ?Stress: No Stress Concern Present  ? Feeling of Stress : Only a little  ?Social Connections: Moderately Integrated  ? Frequency of Communication with Friends and Family: More than three times a week  ? Frequency of Social Gatherings with Friends and Family: More than three times a week  ? Attends Religious Services: More than 4 times per year  ? Active Member of Clubs or Organizations: No  ? Attends Archivist Meetings: Never  ? Marital Status: Married  ?  ? ?Family History: ?The patient's family history includes CAD in his mother. There is no history of Cancer. ? ?ROS:   ?All other ROS reviewed and negative. Pertinent positives noted in the HPI.    ? ?EKGs/Labs/Other Studies Reviewed:   ?The following studies were personally  reviewed by me today: ? ?LHC 09/08/2021 ?  Prox RCA lesion is 80% stenosed. ?  Mid LAD lesion is 60% stenosed. ?  Dist LAD-1 lesion is 99% stenosed. ?  Dist LAD-2 lesion is 99% stenosed. ?  [place] lesion is 90% ste

## 2021-11-04 ENCOUNTER — Inpatient Hospital Stay (HOSPITAL_BASED_OUTPATIENT_CLINIC_OR_DEPARTMENT_OTHER): Payer: Medicare Other | Admitting: Hematology

## 2021-11-04 ENCOUNTER — Other Ambulatory Visit: Payer: Self-pay

## 2021-11-04 ENCOUNTER — Inpatient Hospital Stay (HOSPITAL_COMMUNITY): Payer: Medicare Other

## 2021-11-04 VITALS — BP 108/68 | HR 90 | Temp 96.9°F | Resp 18

## 2021-11-04 DIAGNOSIS — Z5112 Encounter for antineoplastic immunotherapy: Secondary | ICD-10-CM | POA: Diagnosis not present

## 2021-11-04 DIAGNOSIS — Z95828 Presence of other vascular implants and grafts: Secondary | ICD-10-CM

## 2021-11-04 DIAGNOSIS — C221 Intrahepatic bile duct carcinoma: Secondary | ICD-10-CM | POA: Diagnosis not present

## 2021-11-04 DIAGNOSIS — R978 Other abnormal tumor markers: Secondary | ICD-10-CM

## 2021-11-04 DIAGNOSIS — Z79899 Other long term (current) drug therapy: Secondary | ICD-10-CM | POA: Diagnosis not present

## 2021-11-04 LAB — COMPREHENSIVE METABOLIC PANEL
ALT: 47 U/L — ABNORMAL HIGH (ref 0–44)
AST: 59 U/L — ABNORMAL HIGH (ref 15–41)
Albumin: 2.9 g/dL — ABNORMAL LOW (ref 3.5–5.0)
Alkaline Phosphatase: 471 U/L — ABNORMAL HIGH (ref 38–126)
Anion gap: 6 (ref 5–15)
BUN: 14 mg/dL (ref 8–23)
CO2: 25 mmol/L (ref 22–32)
Calcium: 8.7 mg/dL — ABNORMAL LOW (ref 8.9–10.3)
Chloride: 103 mmol/L (ref 98–111)
Creatinine, Ser: 1.01 mg/dL (ref 0.61–1.24)
GFR, Estimated: >60 mL/min (ref >60)
Glucose, Bld: 158 mg/dL — ABNORMAL HIGH (ref 70–99)
Potassium: 4.2 mmol/L (ref 3.5–5.1)
Sodium: 134 mmol/L — ABNORMAL LOW (ref 135–145)
Total Bilirubin: 0.9 mg/dL (ref 0.3–1.2)
Total Protein: 6.6 g/dL (ref 6.5–8.1)

## 2021-11-04 LAB — CBC WITH DIFFERENTIAL/PLATELET
Abs Immature Granulocytes: 0.28 10*3/uL — ABNORMAL HIGH (ref 0.00–0.07)
Basophils Absolute: 0.1 10*3/uL (ref 0.0–0.1)
Basophils Relative: 1 %
Eosinophils Absolute: 0.1 10*3/uL (ref 0.0–0.5)
Eosinophils Relative: 1 %
HCT: 28.7 % — ABNORMAL LOW (ref 39.0–52.0)
Hemoglobin: 9.2 g/dL — ABNORMAL LOW (ref 13.0–17.0)
Immature Granulocytes: 2 %
Lymphocytes Relative: 10 %
Lymphs Abs: 1.4 10*3/uL (ref 0.7–4.0)
MCH: 33.1 pg (ref 26.0–34.0)
MCHC: 32.1 g/dL (ref 30.0–36.0)
MCV: 103.2 fL — ABNORMAL HIGH (ref 80.0–100.0)
Monocytes Absolute: 1.3 10*3/uL — ABNORMAL HIGH (ref 0.1–1.0)
Monocytes Relative: 9 %
Neutro Abs: 10.8 10*3/uL — ABNORMAL HIGH (ref 1.7–7.7)
Neutrophils Relative %: 77 %
Platelets: 180 10*3/uL (ref 150–400)
RBC: 2.78 MIL/uL — ABNORMAL LOW (ref 4.22–5.81)
RDW: 20.3 % — ABNORMAL HIGH (ref 11.5–15.5)
WBC: 14 10*3/uL — ABNORMAL HIGH (ref 4.0–10.5)
nRBC: 0 % (ref 0.0–0.2)

## 2021-11-04 LAB — MAGNESIUM: Magnesium: 1.7 mg/dL (ref 1.7–2.4)

## 2021-11-04 MED ORDER — SODIUM CHLORIDE 0.9 % IV SOLN: Freq: Once | INTRAVENOUS | Status: AC

## 2021-11-04 MED ORDER — SODIUM CHLORIDE 0.9 % IV SOLN
10.0000 mg | Freq: Once | INTRAVENOUS | Status: AC
  Administered 2021-11-04: 10 mg via INTRAVENOUS
  Filled 2021-11-04: qty 10

## 2021-11-04 MED ORDER — SODIUM CHLORIDE 0.9 % IV SOLN
25.0000 mg/m2 | Freq: Once | INTRAVENOUS | Status: AC
  Administered 2021-11-04: 48 mg via INTRAVENOUS
  Filled 2021-11-04: qty 48

## 2021-11-04 MED ORDER — PALONOSETRON HCL INJECTION 0.25 MG/5ML
0.2500 mg | Freq: Once | INTRAVENOUS | Status: AC
  Administered 2021-11-04: 0.25 mg via INTRAVENOUS
  Filled 2021-11-04: qty 5

## 2021-11-04 MED ORDER — MAGNESIUM SULFATE 2 GM/50ML IV SOLN
2.0000 g | Freq: Once | INTRAVENOUS | Status: AC
  Administered 2021-11-04: 2 g via INTRAVENOUS
  Filled 2021-11-04: qty 50

## 2021-11-04 MED ORDER — POTASSIUM CHLORIDE IN NACL 20-0.9 MEQ/L-% IV SOLN
Freq: Once | INTRAVENOUS | Status: AC
  Filled 2021-11-04: qty 1000

## 2021-11-04 MED ORDER — SODIUM CHLORIDE 0.9 % IV SOLN
500.0000 mg/m2 | Freq: Once | INTRAVENOUS | Status: AC
  Administered 2021-11-04: 950 mg via INTRAVENOUS
  Filled 2021-11-04: qty 24.98

## 2021-11-04 MED ORDER — HEPARIN SOD (PORK) LOCK FLUSH 100 UNIT/ML IV SOLN
500.0000 [IU] | Freq: Once | INTRAVENOUS | Status: AC | PRN
  Administered 2021-11-04: 500 [IU]

## 2021-11-04 MED ORDER — SODIUM CHLORIDE 0.9 % IV SOLN
1500.0000 mg | Freq: Once | INTRAVENOUS | Status: AC
  Administered 2021-11-04: 1500 mg via INTRAVENOUS
  Filled 2021-11-04: qty 30

## 2021-11-04 MED ORDER — SODIUM CHLORIDE 0.9% FLUSH
10.0000 mL | INTRAVENOUS | Status: DC | PRN
  Administered 2021-11-04: 10 mL

## 2021-11-04 MED ORDER — SODIUM CHLORIDE 0.9 % IV SOLN
150.0000 mg | Freq: Once | INTRAVENOUS | Status: AC
  Administered 2021-11-04: 150 mg via INTRAVENOUS
  Filled 2021-11-04: qty 150

## 2021-11-04 NOTE — Progress Notes (Signed)
? ?[facility] ?[address] ?[place], [place] [address] ? ? ?CLINIC:  ?Medical Oncology/Hematology ? ?PCP:  ?[name], PA-C ?[address] [place] [address] ?[phone] ? ? ?REASON FOR VISIT:  ?Follow-up for intrahepatic cholangiocarcinoma ? ?PRIOR THERAPY: none ? ?NGS Results: not done ? ?CURRENT THERAPY: Cisplatin + Gemcitabine D1,8 q21d ? ?BRIEF ONCOLOGIC HISTORY:  ?Oncology History  ?Cholangiocarcinoma ([place])  ?02/28/2021 Initial Diagnosis  ? Cholangiocarcinoma [facility]) ?  ?07/17/2021 Cancer Staging  ? Staging form: Perihilar Bile Ducts, AJCC 8th Edition ?- Clinical stage from 07/17/2021: Stage IVB (cTX, cNX, pM1) - Signed by Derek Jack, MD on 07/17/2021 ?Stage prefix: Initial diagnosis ? ?  ?07/22/2021 -  Chemotherapy  ? Patient is on Treatment Plan : BILIARY TRACT Cisplatin + Gemcitabine D1,8 q21d  ?   ? ? ?CANCER STAGING: ? Cancer Staging  ?Cholangiocarcinoma ([place]) ?Staging form: Perihilar Bile Ducts, AJCC 8th Edition ?- Clinical stage from 07/17/2021: Stage IVB (cTX, cNX, pM1) - Signed by Derek Jack, MD on 07/17/2021 ? ? ?INTERVAL HISTORY:  ?[name], a 66 y.o. male, returns for routine follow-up and consideration for next cycle of chemotherapy. Jonathan Keith was last seen on 10/14/2021. ? ?Due for cycle #6 of Cisplatin + Gemcitabine today.  ? ?Overall, he tells me he has been feeling pretty well. His appetite has improved, and he has gained 4 lbs since hist last visit. His activity levels have improved although he still reports fatigue with exertion. His SOB has improved. He denies abdominal pain, ankle swellings, and n/v/d.  ? ?Overall, he feels ready for next cycle of chemo today.  ? ? ?REVIEW OF SYSTEMS:  ?Review of Systems  ?Constitutional:  Positive for fatigue. Negative for appetite change and unexpected weight change (+4 lbs).  ?Respiratory:  Positive for shortness of breath (improved).   ?Cardiovascular:  Negative for leg swelling.   ?Gastrointestinal:  Negative for abdominal pain, diarrhea, nausea and vomiting.  ?Psychiatric/Behavioral:  Positive for sleep disturbance.   ?All other systems reviewed and are negative. ? ?PAST MEDICAL/SURGICAL HISTORY:  ?Past Medical History:  ?Diagnosis Date  ? Cancer [facility])   ? Chronic pain   ? neck, back, knees  ? Class 1 obesity due to excess calories with body mass index (BMI) of 31.0 to 31.9 in adult   ? Claudication [facility])   ? DDD (degenerative disc disease), cervical   ? Diabetes mellitus ([place])   ? Dyslipidemia   ? Hypertension   ? Port-A-Cath in place 07/20/2021  ? Tobacco dependence   ? ?Past Surgical History:  ?Procedure Laterality Date  ? BILIARY BRUSHING N/A 02/25/2021  ? Procedure: BILIARY BRUSHING;  Surgeon: Rogene Houston, MD;  Location: [facility];  Service: Gastroenterology;  Laterality: N/A;  ? BILIARY STENT PLACEMENT N/A 02/25/2021  ? Procedure: BILIARY STENT PLACEMENT;  Surgeon: Rogene Houston, MD;  Location: [facility];  Service: Gastroenterology;  Laterality: N/A;  ? BILIARY STENT PLACEMENT  02/27/2021  ? Procedure: BILIARY STENT PLACEMENT (10FR x 9cm) IN THE RIGHT SYSTEM;  Surgeon: Rogene Houston, MD;  Location: [facility];  Service: Gastroenterology;;  ? BREAST SURGERY Left   ? benign lump- in his 64s.  ? ERCP N/A 02/25/2021  ? Procedure: ENDOSCOPIC RETROGRADE CHOLANGIOPANCREATOGRAPHY (ERCP);  Surgeon: Rogene Houston, MD;  Location: [facility];  Service: Gastroenterology;  Laterality: N/A;  ? ERCP N/A 02/27/2021  ? Procedure: ENDOSCOPIC RETROGRADE CHOLANGIOPANCREATOGRAPHY (ERCP);  Surgeon: Rogene Houston, MD;  Location: [facility];  Service:  Gastroenterology;  Laterality: N/A;  ? IR IMAGING GUIDED PORT INSERTION  07/21/2021  ? KNEE SURGERY Left   ? x2  ? LEFT HEART CATH AND CORONARY ANGIOGRAPHY N/A 09/08/2021  ? Procedure: LEFT HEART CATH AND CORONARY ANGIOGRAPHY;  Surgeon: Early Osmond, MD;  Location: [facility] CV LAB;  Service: Cardiovascular;  Laterality: N/A;  ? SPHINCTEROTOMY N/A 02/25/2021  ?  Procedure: SPHINCTEROTOMY;  Surgeon: Rogene Houston, MD;  Location: [facility];  Service: Gastroenterology;  Laterality: N/A;  ? STENT REMOVAL  02/27/2021  ? Procedure: STENT REMOVAL (8.5Fr x 9cm);  Surgeon: Rogene Houston, MD;  Location: [facility];  Service: Gastroenterology;;  ? ? ?SOCIAL HISTORY:  ?Social History  ? ?Socioeconomic History  ? Marital status: Married  ?  Spouse name: Not on file  ? Number of children: Not on file  ? Years of education: Not on file  ? Highest education level: Not on file  ?Occupational History  ? 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 Smoking status: Every Day  ?  Packs/day: 0.50  ?  Years: 56.00  ?  Pack years: 28.00  ?  Types: Cigarettes  ? Smokeless tobacco: Never  ?Vaping Use  ? Vaping Use: Never used  ?Substance and Sexual Activity  ? Alcohol use: Not Currently  ?  Comment: stopped 2.5 years ago 03/11/21  ? Drug use: Never  ? Sexual activity: Not on file  ?Other Topics Concern  ? Not on file  ?Social History Narrative  ? Not on file  ? ?Social Determinants of Health  ? ?Financial Resource Strain: Medium Risk  ? Difficulty of Paying Living Expenses: Somewhat hard  ?Food Insecurity: No Food Insecurity  ? Worried About Charity fundraiser in the Last Year: Never true  ? Ran Out of Food in the Last Year: Never true  ?Transportation Needs: No Transportation Needs  ? Lack of Transportation (Medical): No  ? Lack of Transportation (Non-Medical): No  ?Physical Activity: Insufficiently Active  ? Days of Exercise per Week: 2 days  ? Minutes of Exercise per Session: 20 min  ?Stress: No Stress Concern Present  ? Feeling of Stress : Only a little  ?Social Connections: Moderately Integrated  ? Frequency of Communication with Friends and Family: More than three times a week  ? Frequency of Social Gatherings with Friends and Family: More than three times a week  ? Attends Religious Services: More than 4 times per year  ? Active Member of Clubs or Organizations: No  ? Attends Theatre manager Meetings: Never  ? Marital Status: Married  ?Intimate Partner Violence: Not At Risk  ? Fear of Current or Ex-Partner: No  ? Emotionally Abused: No  ? Physically Abused: No  ? Sexually Abused: No  ? ? ?FAMILY HISTORY:  ?Family History  ?Problem Relation Age of Onset  ? CAD Mother   ? Cancer Neg Hx   ? ? ?CURRENT MEDICATIONS:  ?Current Outpatient Medications  ?Medication Sig Dispense Refill  ? acetaminophen (TYLENOL) 500 MG tablet Take 1,000 mg by mouth every 6 (six) hours as needed for moderate pain or headache.    ? aspirin EC 81 MG tablet Take 81 mg by mouth as needed (chest pain). Swallow whole.    ? atorvastatin (LIPITOR) 40 MG tablet Take 1 tablet (40 mg total) by mouth daily at 6 PM. 90 tablet 3  ? CISPLATIN IV Inject into the vein once a week. Days 1, 8 every 21 days    ?  clopidogrel (PLAVIX) 75 MG tablet Take 1 tablet (75 mg total) by mouth daily with breakfast. 90 tablet 3  ? Dextromethorphan-guaiFENesin (ROBITUSSIN DM PO) Take 1 Dose by mouth at bedtime as needed (allergy flare up). 2 tsp at night as needed for allergy flare ups    ? Durvalumab (IMFINZI IV) Inject into the vein every 21 ( twenty-one) days.    ? famotidine (PEPCID) 20 MG tablet Take 20 mg by mouth daily.    ? ferrous sulfate (FERROUSUL) 325 (65 FE) MG tablet Take 1 tablet (325 mg total) by mouth daily with breakfast.    ? Gemcitabine HCl (GEMZAR IV) Inject into the vein once a week. Days 1, 8 every 21 days    ? loratadine (CLARITIN) 10 MG tablet Take 10 mg by mouth daily. Takes for a few days after shot to boost WBC. About every 2nd treatment    ? megestrol (MEGACE) 400 MG/10ML suspension Take 10 mLs (400 mg total) by mouth 2 (two) times daily. 480 mL 0  ? midodrine (PROAMATINE) 5 MG tablet Take 1 tablet (5 mg total) by mouth 3 (three) times daily with meals. 270 tablet 3  ? pantoprazole (PROTONIX) 40 MG tablet Take 1 tablet (40 mg total) by mouth daily. 90 tablet 3  ? tamsulosin (FLOMAX) 0.4 MG CAPS capsule Take 1 capsule (0.4  mg total) by mouth at bedtime. 30 capsule 3  ? lidocaine-prilocaine (EMLA) cream Apply a small amount to port a cath site and cover with plastic wrap 1 hour prior to chemotherapy appointments (Patient not taking: Report

## 2021-11-04 NOTE — Progress Notes (Signed)
Pt presents today for Imfinzi, Gemzar, and Cisplatin per provider's order. Labs and vital signs WNL for treatment. Okay to proceed with treatment today per Dr.K. ? ?Pt urinated 498m prior to Cisplatin administration. ? ?Imfinzi,Gemzar, and Cisplatin given today per MD orders. Tolerated infusion without adverse affects. Vital signs stable. No complaints at this time. Discharged from clinic via wheelchair in stable condition. Alert and oriented x 3. [facility]   ?

## 2021-11-04 NOTE — Patient Instructions (Signed)
[facility]  Discharge Instructions: ?Thank you for choosing [facility] to provide your oncology and hematology care.  ?If you have a lab appointment with the [facility], please come in thru the Main Entrance and check in at the main information desk. ? ?Wear comfortable clothing and clothing appropriate for easy access to any Portacath or PICC line.  ? ?We strive to give you quality time with your provider. You may need to reschedule your appointment if you arrive late (15 or more minutes).  Arriving late affects you and other patients whose appointments are after yours.  Also, if you miss three or more appointments without notifying the office, you may be dismissed from the clinic at the provider?s discretion.    ?  ?For prescription refill requests, have your pharmacy contact our office and allow 72 hours for refills to be completed.   ? ?Today you received the following chemotherapy and/or immunotherapy Imfinzi,Gemzar, and Cisplatin  ?  ? ? ?BELOW ARE SYMPTOMS THAT SHOULD BE REPORTED IMMEDIATELY: ?*FEVER GREATER THAN 100.4 F (38 ?C) OR HIGHER ?*CHILLS OR SWEATING ?*NAUSEA AND VOMITING THAT IS NOT CONTROLLED WITH YOUR NAUSEA MEDICATION ?*UNUSUAL SHORTNESS OF BREATH ?*UNUSUAL BRUISING OR BLEEDING ?*URINARY PROBLEMS (pain or burning when urinating, or frequent urination) ?*BOWEL PROBLEMS (unusual diarrhea, constipation, pain near the anus) ?TENDERNESS IN MOUTH AND THROAT WITH OR WITHOUT PRESENCE OF ULCERS (sore throat, sores in mouth, or a toothache) ?UNUSUAL RASH, SWELLING OR PAIN  ?UNUSUAL VAGINAL DISCHARGE OR ITCHING  ? ?Items with * indicate a potential emergency and should be followed up as soon as possible or go to the Emergency Department if any problems should occur. ? ?Please show the CHEMOTHERAPY ALERT CARD or IMMUNOTHERAPY ALERT CARD at check-in to the Emergency Department and triage nurse. ? ?Should you have questions after your visit or need to cancel or reschedule your  appointment, please contact [facility] [phone]  and follow the prompts.  Office hours are 8:00 a.m. to 4:30 p.m. Monday - Friday. Please note that voicemails left after 4:00 p.m. may not be returned until the following business day.  We are closed weekends and major holidays. You have access to a nurse at all times for urgent questions. Please call the main number to the clinic [phone] and follow the prompts. ? ?For any non-urgent questions, you may also contact your provider using MyChart. We now offer e-Visits for anyone 31 and older to request care online for non-urgent symptoms. For details visit [email or web address]. ?  ?Also download the MyChart app! Go to the app store, search "MyChart", open the app, select [facility], and log in with your MyChart username and password. ? ?Due to Covid, a mask is required upon entering the hospital/clinic. If you do not have a mask, one will be given to you upon arrival. For doctor visits, patients may have 1 support person aged 35 or older with them. For treatment visits, patients cannot have anyone with them due to current Covid guidelines and our immunocompromised population.  ?

## 2021-11-04 NOTE — Patient Instructions (Addendum)
[place] at [facility] ?Discharge Instructions ? ? ?You were seen and examined today by Dr. Delton Coombes. ? ?He reviewed the results of your lab work which is normal/stable. ? ?We will proceed with your treatment today. ? ?Continue to remain active to maintain your energy level.  ? ?Return as scheduled for lab work, treatment, and office visit.  ? ? ?Thank you for choosing [place] at [facility] to provide your oncology and hematology care.  To afford each patient quality time with our provider, please arrive at least 15 minutes before your scheduled appointment time.  ? ?If you have a lab appointment with the [place] please come in thru the Main Entrance and check in at the main information desk. ? ?You need to re-schedule your appointment should you arrive 10 or more minutes late.  We strive to give you quality time with our providers, and arriving late affects you and other patients whose appointments are after yours.  Also, if you no show three or more times for appointments you may be dismissed from the clinic at the providers discretion.     ?Again, thank you for choosing [facility].  Our hope is that these requests will decrease the amount of time that you wait before being seen by our physicians.       ?_____________________________________________________________ ? ?Should you have questions after your visit to [facility], please contact our office at [phone] and follow the prompts.  Our office hours are 8:00 a.m. and 4:30 p.m. Monday - Friday.  Please note that voicemails left after 4:00 p.m. may not be returned until the following business day.  We are closed weekends and major holidays.  You do have access to a nurse 24-7, just call the main number to the clinic [phone] and do not press any options, hold on the line and a nurse will answer the phone.   ? ?For prescription refill requests, have your pharmacy  contact our office and allow 72 hours.   ? ?Due to Covid, you will need to wear a mask upon entering the hospital. If you do not have a mask, a mask will be given to you at the Main Entrance upon arrival. For doctor visits, patients may have 1 support person age 65 or older with them. For treatment visits, patients can not have anyone with them due to social distancing guidelines and our immunocompromised population.  ? ?   ?

## 2021-11-04 NOTE — Progress Notes (Signed)
Patient has been examined by Dr. Katragadda, and vital signs and labs have been reviewed. ANC, Creatinine, LFTs, hemoglobin, and platelets are within treatment parameters per M.D. - pt may proceed with treatment.    °

## 2021-11-05 ENCOUNTER — Encounter: Payer: Self-pay | Admitting: Cardiovascular Disease

## 2021-11-05 ENCOUNTER — Ambulatory Visit (INDEPENDENT_AMBULATORY_CARE_PROVIDER_SITE_OTHER): Payer: Medicare Other | Admitting: Cardiovascular Disease

## 2021-11-05 VITALS — BP 122/68 | HR 102 | Ht 71.0 in | Wt 160.8 lb

## 2021-11-05 DIAGNOSIS — I5022 Chronic systolic (congestive) heart failure: Secondary | ICD-10-CM

## 2021-11-05 DIAGNOSIS — I2510 Atherosclerotic heart disease of native coronary artery without angina pectoris: Secondary | ICD-10-CM

## 2021-11-05 DIAGNOSIS — I10 Essential (primary) hypertension: Secondary | ICD-10-CM

## 2021-11-05 DIAGNOSIS — E782 Mixed hyperlipidemia: Secondary | ICD-10-CM | POA: Diagnosis not present

## 2021-11-05 LAB — CANCER ANTIGEN 19-9: CA 19-9: 475 U/mL — ABNORMAL HIGH (ref 0–35)

## 2021-11-05 MED ORDER — FUROSEMIDE 20 MG PO TABS: ORAL_TABLET | ORAL | 1 refills | Status: DC

## 2021-11-05 NOTE — Patient Instructions (Signed)
Medication Instructions: ?STOP Aspirin ? ? ?Take Lasix 20 mg daily as needed for swelling ? ?Labwork: ?None today ? ?Testing/Procedures: ?None today ? ?Follow-Up: ?4 months ? ?Any Other Special Instructions Will Be Listed Below (If Applicable). ? ?If you need a refill on your cardiac medications before your next appointment, please call your pharmacy. ? ?

## 2021-11-11 ENCOUNTER — Encounter (HOSPITAL_COMMUNITY): Payer: Self-pay

## 2021-11-11 ENCOUNTER — Inpatient Hospital Stay (HOSPITAL_COMMUNITY): Payer: Medicare Other | Attending: Hematology

## 2021-11-11 ENCOUNTER — Inpatient Hospital Stay (HOSPITAL_COMMUNITY): Payer: Medicare Other

## 2021-11-11 VITALS — BP 116/72 | HR 95 | Temp 97.5°F | Resp 18

## 2021-11-11 DIAGNOSIS — C221 Intrahepatic bile duct carcinoma: Secondary | ICD-10-CM

## 2021-11-11 DIAGNOSIS — R634 Abnormal weight loss: Secondary | ICD-10-CM | POA: Insufficient documentation

## 2021-11-11 DIAGNOSIS — Z79899 Other long term (current) drug therapy: Secondary | ICD-10-CM | POA: Insufficient documentation

## 2021-11-11 DIAGNOSIS — E43 Unspecified severe protein-calorie malnutrition: Secondary | ICD-10-CM | POA: Diagnosis not present

## 2021-11-11 DIAGNOSIS — Z5111 Encounter for antineoplastic chemotherapy: Secondary | ICD-10-CM | POA: Diagnosis present

## 2021-11-11 DIAGNOSIS — Z5189 Encounter for other specified aftercare: Secondary | ICD-10-CM | POA: Diagnosis not present

## 2021-11-11 DIAGNOSIS — Z5112 Encounter for antineoplastic immunotherapy: Secondary | ICD-10-CM | POA: Diagnosis present

## 2021-11-11 DIAGNOSIS — Z95828 Presence of other vascular implants and grafts: Secondary | ICD-10-CM

## 2021-11-11 LAB — COMPREHENSIVE METABOLIC PANEL
ALT: 64 U/L — ABNORMAL HIGH (ref 0–44)
AST: 65 U/L — ABNORMAL HIGH (ref 15–41)
Albumin: 3 g/dL — ABNORMAL LOW (ref 3.5–5.0)
Alkaline Phosphatase: 398 U/L — ABNORMAL HIGH (ref 38–126)
Anion gap: 8 (ref 5–15)
BUN: 18 mg/dL (ref 8–23)
CO2: 24 mmol/L (ref 22–32)
Calcium: 8.7 mg/dL — ABNORMAL LOW (ref 8.9–10.3)
Chloride: 101 mmol/L (ref 98–111)
Creatinine, Ser: 0.9 mg/dL (ref 0.61–1.24)
GFR, Estimated: >60 mL/min (ref >60)
Glucose, Bld: 177 mg/dL — ABNORMAL HIGH (ref 70–99)
Potassium: 4.4 mmol/L (ref 3.5–5.1)
Sodium: 133 mmol/L — ABNORMAL LOW (ref 135–145)
Total Bilirubin: 1.3 mg/dL — ABNORMAL HIGH (ref 0.3–1.2)
Total Protein: 6.6 g/dL (ref 6.5–8.1)

## 2021-11-11 LAB — CBC WITH DIFFERENTIAL/PLATELET
Abs Immature Granulocytes: 0.02 10*3/uL (ref 0.00–0.07)
Basophils Absolute: 0 10*3/uL (ref 0.0–0.1)
Basophils Relative: 1 %
Eosinophils Absolute: 0 10*3/uL (ref 0.0–0.5)
Eosinophils Relative: 0 %
HCT: 26.4 % — ABNORMAL LOW (ref 39.0–52.0)
Hemoglobin: 8.6 g/dL — ABNORMAL LOW (ref 13.0–17.0)
Immature Granulocytes: 0 %
Lymphocytes Relative: 14 %
Lymphs Abs: 0.9 10*3/uL (ref 0.7–4.0)
MCH: 33.1 pg (ref 26.0–34.0)
MCHC: 32.6 g/dL (ref 30.0–36.0)
MCV: 101.5 fL — ABNORMAL HIGH (ref 80.0–100.0)
Monocytes Absolute: 0.9 10*3/uL (ref 0.1–1.0)
Monocytes Relative: 14 %
Neutro Abs: 4.4 10*3/uL (ref 1.7–7.7)
Neutrophils Relative %: 71 %
Platelets: 117 10*3/uL — ABNORMAL LOW (ref 150–400)
RBC: 2.6 MIL/uL — ABNORMAL LOW (ref 4.22–5.81)
RDW: 18.8 % — ABNORMAL HIGH (ref 11.5–15.5)
WBC: 6.2 10*3/uL (ref 4.0–10.5)
nRBC: 0 % (ref 0.0–0.2)

## 2021-11-11 LAB — TSH: TSH: 0.4 u[IU]/mL (ref 0.350–4.500)

## 2021-11-11 LAB — MAGNESIUM: Magnesium: 1.7 mg/dL (ref 1.7–2.4)

## 2021-11-11 MED ORDER — SODIUM CHLORIDE 0.9% FLUSH
10.0000 mL | INTRAVENOUS | Status: DC | PRN
  Administered 2021-11-11: 10 mL

## 2021-11-11 MED ORDER — SODIUM CHLORIDE 0.9 % IV SOLN: Freq: Once | INTRAVENOUS | Status: AC

## 2021-11-11 MED ORDER — SODIUM CHLORIDE 0.9 % IV SOLN
25.0000 mg/m2 | Freq: Once | INTRAVENOUS | Status: AC
  Administered 2021-11-11: 48 mg via INTRAVENOUS
  Filled 2021-11-11: qty 48

## 2021-11-11 MED ORDER — SODIUM CHLORIDE 0.9 % IV SOLN
500.0000 mg/m2 | Freq: Once | INTRAVENOUS | Status: AC
  Administered 2021-11-11: 950 mg via INTRAVENOUS
  Filled 2021-11-11: qty 24.98

## 2021-11-11 MED ORDER — HEPARIN SOD (PORK) LOCK FLUSH 100 UNIT/ML IV SOLN
500.0000 [IU] | Freq: Once | INTRAVENOUS | Status: AC | PRN
  Administered 2021-11-11: 500 [IU]

## 2021-11-11 MED ORDER — SODIUM CHLORIDE 0.9 % IV SOLN
150.0000 mg | Freq: Once | INTRAVENOUS | Status: AC
  Administered 2021-11-11: 150 mg via INTRAVENOUS
  Filled 2021-11-11: qty 5

## 2021-11-11 MED ORDER — SODIUM CHLORIDE 0.9 % IV SOLN
10.0000 mg | Freq: Once | INTRAVENOUS | Status: AC
  Administered 2021-11-11: 10 mg via INTRAVENOUS
  Filled 2021-11-11: qty 1

## 2021-11-11 MED ORDER — MAGNESIUM SULFATE 2 GM/50ML IV SOLN
2.0000 g | Freq: Once | INTRAVENOUS | Status: AC
  Administered 2021-11-11: 2 g via INTRAVENOUS
  Filled 2021-11-11: qty 50

## 2021-11-11 MED ORDER — POTASSIUM CHLORIDE IN NACL 20-0.9 MEQ/L-% IV SOLN
Freq: Once | INTRAVENOUS | Status: AC
  Filled 2021-11-11: qty 1000

## 2021-11-11 MED ORDER — PALONOSETRON HCL INJECTION 0.25 MG/5ML
0.2500 mg | Freq: Once | INTRAVENOUS | Status: AC
  Administered 2021-11-11: 0.25 mg via INTRAVENOUS
  Filled 2021-11-11: qty 5

## 2021-11-11 NOTE — Patient Instructions (Signed)
[place]  Discharge Instructions: ?Thank you for choosing [place] to provide your oncology and hematology care.  ?If you have a lab appointment with the [place], please come in thru the Main Entrance and check in at the main information desk. ? ?Wear comfortable clothing and clothing appropriate for easy access to any Portacath or PICC line.  ? ?We strive to give you quality time with your provider. You may need to reschedule your appointment if you arrive late (15 or more minutes).  Arriving late affects you and other patients whose appointments are after yours.  Also, if you miss three or more appointments without notifying the office, you may be dismissed from the clinic at the provider?s discretion.    ?  ?For prescription refill requests, have your pharmacy contact our office and allow 72 hours for refills to be completed.   ? ?Today you received the following chemotherapy and/or immunotherapy agents Gemzar and Cisplatin, return as scheduled. ?  ?To help prevent nausea and vomiting after your treatment, we encourage you to take your nausea medication as directed. ? ?BELOW ARE SYMPTOMS THAT SHOULD BE REPORTED IMMEDIATELY: ?*FEVER GREATER THAN 100.4 F (38 ?C) OR HIGHER ?*CHILLS OR SWEATING ?*NAUSEA AND VOMITING THAT IS NOT CONTROLLED WITH YOUR NAUSEA MEDICATION ?*UNUSUAL SHORTNESS OF BREATH ?*UNUSUAL BRUISING OR BLEEDING ?*URINARY PROBLEMS (pain or burning when urinating, or frequent urination) ?*BOWEL PROBLEMS (unusual diarrhea, constipation, pain near the anus) ?TENDERNESS IN MOUTH AND THROAT WITH OR WITHOUT PRESENCE OF ULCERS (sore throat, sores in mouth, or a toothache) ?UNUSUAL RASH, SWELLING OR PAIN  ?UNUSUAL VAGINAL DISCHARGE OR ITCHING  ? ?Items with * indicate a potential emergency and should be followed up as soon as possible or go to the Emergency Department if any problems should occur. ? ?Please show the CHEMOTHERAPY ALERT CARD or IMMUNOTHERAPY ALERT CARD at  check-in to the Emergency Department and triage nurse. ? ?Should you have questions after your visit or need to cancel or reschedule your appointment, please contact [facility] [phone]  and follow the prompts.  Office hours are 8:00 a.m. to 4:30 p.m. Monday - Friday. Please note that voicemails left after 4:00 p.m. may not be returned until the following business day.  We are closed weekends and major holidays. You have access to a nurse at all times for urgent questions. Please call the main number to the clinic [phone] and follow the prompts. ? ?For any non-urgent questions, you may also contact your provider using MyChart. We now offer e-Visits for anyone 36 and older to request care online for non-urgent symptoms. For details visit [email or web address]. ?  ?Also download the MyChart app! Go to the app store, search "MyChart", open the app, select [place], and log in with your MyChart username and password. ? ?Due to Covid, a mask is required upon entering the hospital/clinic. If you do not have a mask, one will be given to you upon arrival. For doctor visits, patients may have 1 support person aged 78 or older with them. For treatment visits, patients cannot have anyone with them due to current Covid guidelines and our immunocompromised population.  ?

## 2021-11-11 NOTE — Progress Notes (Signed)
Patient tolerated chemotherapy with no complaints voiced. Side effects with management reviewed understanding verbalized. Port site clean and dry with no bruising or swelling noted at site. Good blood return noted before and after administration of chemotherapy. Band aid applied. Patient left in satisfactory condition with VSS and no s/s of distress noted. 

## 2021-11-12 ENCOUNTER — Other Ambulatory Visit (HOSPITAL_COMMUNITY): Payer: Self-pay | Admitting: Hematology

## 2021-11-12 ENCOUNTER — Ambulatory Visit (HOSPITAL_COMMUNITY): Payer: Medicare Other

## 2021-11-13 ENCOUNTER — Inpatient Hospital Stay (HOSPITAL_COMMUNITY): Payer: Medicare Other

## 2021-11-13 ENCOUNTER — Inpatient Hospital Stay (HOSPITAL_COMMUNITY): Payer: Medicare Other | Admitting: Dietician

## 2021-11-13 VITALS — BP 104/65 | HR 103 | Temp 97.9°F | Resp 18

## 2021-11-13 DIAGNOSIS — C221 Intrahepatic bile duct carcinoma: Secondary | ICD-10-CM

## 2021-11-13 DIAGNOSIS — Z95828 Presence of other vascular implants and grafts: Secondary | ICD-10-CM

## 2021-11-13 DIAGNOSIS — Z5111 Encounter for antineoplastic chemotherapy: Secondary | ICD-10-CM | POA: Diagnosis not present

## 2021-11-13 MED ORDER — PEGFILGRASTIM-CBQV 6 MG/0.6ML ~~LOC~~ SOSY
6.0000 mg | PREFILLED_SYRINGE | Freq: Once | SUBCUTANEOUS | Status: AC
  Administered 2021-11-13: 6 mg via SUBCUTANEOUS
  Filled 2021-11-13: qty 0.6

## 2021-11-13 NOTE — Progress Notes (Signed)
Nutrition Follow-up: ? ?Patient with intrahepatic cholangiocarcinoma. He is recurrently receiving cisplatin/gemcitabine + durvalumab q21d.  ? ?Met with patient and wife during Udencya injection. Patient is not feeling well today s/p chemotherapy on 4/4. Wife reports ongoing decreased appetite. They tried CIB as suggested and he likes these. Patient is drinking 1-2 packets mixed with whole milk. Patient reports smells are increasingly bothersome. Yesterday he ate bowl of cheerios with half a banana, few bites of "taco chips" (taco seasoning, hamburger meat, cheese poured on top of tortilla chips), 1/2 tar salad (lettuce, eggs, bologna), snacked on sliced apples and cantaloupe, and 2 CIB. Patient denies nausea, vomiting, diarrhea, constipation.  ? ?   ?Medications: Lasix ? ?Labs: Na 133, Glucose 177 ? ?Anthropometrics: Last weight 152 lb 6.4 on 4/04 decreased  ? ?3/28 - 154 lb 12.8 oz  ?3/14 - 151 lb 3.2 oz ?3/7 - 150 lb 6.4 oz  ? ?Nutrition Focused Physical Exam: ?Orbital Region: severe ?Buccal Region: severe ?Upper Arm Region: severe ?Thoracic and Lumbar Region: moderate ?Temple Region: moderate ?Clavicle Bone Region: moderate ?Shoulder and Acromion Bone Region: severe ?Dorsal Hand: moderate ?Eyes: reviewed ?Skin: reviewed ?Nails: reviewed ? ? ?NUTRITION DIAGNOSIS: Unintentional weight loss ongoing ? ? ?MALNUTRITION DIAGNOSIS: Patient meets criteria for severe malnutrition in the context of chronic illness (liver cancer) as evidenced by moderate/severe fat and muscle depletion, intake meeting </=75% of needs for >1 month, 19.6% wt loss in 9 months which is significant for time frame.  ? ? ?INTERVENTION:  ?Continue strategies for increasing calories and protein with small frequent meals and snacks ?Continue drinking CIB with whole milk, recommend three times daily in between meals ?Reviewed strategies for altered taste/smell, recommend placing lids on cups and choosing cold/room temperature foods as these typically  have less smell ?  ? ?MONITORING, EVALUATION, GOAL: weight trends, intake ? ? ?NEXT VISIT: To be scheduled  ? ? ? ?

## 2021-11-13 NOTE — Progress Notes (Signed)
[name] presents today for Udenyca injection per the provider's orders.  Stable during administration without incident; injection site WNL; see MAR for injection details.  Patient tolerated procedure well and without incident.  No questions or complaints noted at this time.  ?

## 2021-11-13 NOTE — Patient Instructions (Signed)
[facility]  Discharge Instructions: Thank you for choosing [facility] to provide your oncology and hematology care.  If you have a lab appointment with the Cancer Center, please come in thru the Main Entrance and check in at the main information desk.  Wear comfortable clothing and clothing appropriate for easy access to any Portacath or PICC line.   We strive to give you quality time with your provider. You may need to reschedule your appointment if you arrive late (15 or more minutes).  Arriving late affects you and other patients whose appointments are after yours.  Also, if you miss three or more appointments without notifying the office, you may be dismissed from the clinic at the provider's discretion.      For prescription refill requests, have your pharmacy contact our office and allow 72 hours for refills to be completed.    Today you received the following chemotherapy and/or immunotherapy agents Udenyca      To help prevent nausea and vomiting after your treatment, we encourage you to take your nausea medication as directed.  BELOW ARE SYMPTOMS THAT SHOULD BE REPORTED IMMEDIATELY: *FEVER GREATER THAN 100.4 F (38 C) OR HIGHER *CHILLS OR SWEATING *NAUSEA AND VOMITING THAT IS NOT CONTROLLED WITH YOUR NAUSEA MEDICATION *UNUSUAL SHORTNESS OF BREATH *UNUSUAL BRUISING OR BLEEDING *URINARY PROBLEMS (pain or burning when urinating, or frequent urination) *BOWEL PROBLEMS (unusual diarrhea, constipation, pain near the anus) TENDERNESS IN MOUTH AND THROAT WITH OR WITHOUT PRESENCE OF ULCERS (sore throat, sores in mouth, or a toothache) UNUSUAL RASH, SWELLING OR PAIN  UNUSUAL VAGINAL DISCHARGE OR ITCHING   Items with * indicate a potential emergency and should be followed up as soon as possible or go to the Emergency Department if any problems should occur.  Please show the CHEMOTHERAPY ALERT CARD or IMMUNOTHERAPY ALERT CARD at check-in to the Emergency  Department and triage nurse.  Should you have questions after your visit or need to cancel or reschedule your appointment, please contact [facility] [phone]  and follow the prompts.  Office hours are 8:00 a.m. to 4:30 p.m. Monday - Friday. Please note that voicemails left after 4:00 p.m. may not be returned until the following business day.  We are closed weekends and major holidays. You have access to a nurse at all times for urgent questions. Please call the main number to the clinic [phone] and follow the prompts.  For any non-urgent questions, you may also contact your provider using MyChart. We now offer e-Visits for anyone 18 and older to request care online for non-urgent symptoms. For details visit [email or web address].   Also download the MyChart app! Go to the app store, search "MyChart", open the app, select [place], and log in with your MyChart username and password.  Due to Covid, a mask is required upon entering the hospital/clinic. If you do not have a mask, one will be given to you upon arrival. For doctor visits, patients may have 1 support person aged 18 or older with them. For treatment visits, patients cannot have anyone with them due to current Covid guidelines and our immunocompromised population.  

## 2021-11-17 ENCOUNTER — Ambulatory Visit: Payer: Medicare Other | Admitting: Physician Assistant

## 2021-11-25 ENCOUNTER — Inpatient Hospital Stay (HOSPITAL_COMMUNITY): Payer: Medicare Other

## 2021-11-25 ENCOUNTER — Inpatient Hospital Stay (HOSPITAL_BASED_OUTPATIENT_CLINIC_OR_DEPARTMENT_OTHER): Payer: Medicare Other | Admitting: Hematology

## 2021-11-25 VITALS — BP 110/68 | HR 87 | Temp 97.2°F | Resp 18

## 2021-11-25 DIAGNOSIS — C249 Malignant neoplasm of biliary tract, unspecified: Secondary | ICD-10-CM

## 2021-11-25 DIAGNOSIS — C221 Intrahepatic bile duct carcinoma: Secondary | ICD-10-CM

## 2021-11-25 DIAGNOSIS — R0602 Shortness of breath: Secondary | ICD-10-CM | POA: Diagnosis not present

## 2021-11-25 DIAGNOSIS — Z95828 Presence of other vascular implants and grafts: Secondary | ICD-10-CM

## 2021-11-25 DIAGNOSIS — I110 Hypertensive heart disease with heart failure: Secondary | ICD-10-CM | POA: Diagnosis not present

## 2021-11-25 LAB — CBC WITH DIFFERENTIAL/PLATELET
Abs Immature Granulocytes: 0.07 10*3/uL (ref 0.00–0.07)
Basophils Absolute: 0 10*3/uL (ref 0.0–0.1)
Basophils Relative: 0 %
Eosinophils Absolute: 0.1 10*3/uL (ref 0.0–0.5)
Eosinophils Relative: 1 %
HCT: 28.5 % — ABNORMAL LOW (ref 39.0–52.0)
Hemoglobin: 9.1 g/dL — ABNORMAL LOW (ref 13.0–17.0)
Immature Granulocytes: 1 %
Lymphocytes Relative: 12 %
Lymphs Abs: 1.1 10*3/uL (ref 0.7–4.0)
MCH: 33.6 pg (ref 26.0–34.0)
MCHC: 31.9 g/dL (ref 30.0–36.0)
MCV: 105.2 fL — ABNORMAL HIGH (ref 80.0–100.0)
Monocytes Absolute: 0.9 10*3/uL (ref 0.1–1.0)
Monocytes Relative: 11 %
Neutro Abs: 6.7 10*3/uL (ref 1.7–7.7)
Neutrophils Relative %: 75 %
Platelets: 146 10*3/uL — ABNORMAL LOW (ref 150–400)
RBC: 2.71 MIL/uL — ABNORMAL LOW (ref 4.22–5.81)
RDW: 20.5 % — ABNORMAL HIGH (ref 11.5–15.5)
WBC: 8.9 10*3/uL (ref 4.0–10.5)
nRBC: 0 % (ref 0.0–0.2)

## 2021-11-25 LAB — COMPREHENSIVE METABOLIC PANEL
ALT: 42 U/L (ref 0–44)
AST: 49 U/L — ABNORMAL HIGH (ref 15–41)
Albumin: 2.9 g/dL — ABNORMAL LOW (ref 3.5–5.0)
Alkaline Phosphatase: 412 U/L — ABNORMAL HIGH (ref 38–126)
Anion gap: 7 (ref 5–15)
BUN: 16 mg/dL (ref 8–23)
CO2: 24 mmol/L (ref 22–32)
Calcium: 8.7 mg/dL — ABNORMAL LOW (ref 8.9–10.3)
Chloride: 103 mmol/L (ref 98–111)
Creatinine, Ser: 0.97 mg/dL (ref 0.61–1.24)
GFR, Estimated: >60 mL/min (ref >60)
Glucose, Bld: 184 mg/dL — ABNORMAL HIGH (ref 70–99)
Potassium: 4.4 mmol/L (ref 3.5–5.1)
Sodium: 134 mmol/L — ABNORMAL LOW (ref 135–145)
Total Bilirubin: 1.1 mg/dL (ref 0.3–1.2)
Total Protein: 6.4 g/dL — ABNORMAL LOW (ref 6.5–8.1)

## 2021-11-25 LAB — MAGNESIUM: Magnesium: 1.6 mg/dL — ABNORMAL LOW (ref 1.7–2.4)

## 2021-11-25 MED ORDER — PALONOSETRON HCL INJECTION 0.25 MG/5ML
0.2500 mg | Freq: Once | INTRAVENOUS | Status: AC
  Administered 2021-11-25: 0.25 mg via INTRAVENOUS
  Filled 2021-11-25: qty 5

## 2021-11-25 MED ORDER — TAMSULOSIN HCL 0.4 MG PO CAPS: ORAL_CAPSULE | ORAL | 2 refills | Status: DC

## 2021-11-25 MED ORDER — POTASSIUM CHLORIDE IN NACL 20-0.9 MEQ/L-% IV SOLN
Freq: Once | INTRAVENOUS | Status: AC
  Filled 2021-11-25: qty 1000

## 2021-11-25 MED ORDER — SODIUM CHLORIDE 0.9 % IV SOLN
10.0000 mg | Freq: Once | INTRAVENOUS | Status: AC
  Administered 2021-11-25: 10 mg via INTRAVENOUS
  Filled 2021-11-25: qty 10

## 2021-11-25 MED ORDER — SODIUM CHLORIDE 0.9 % IV SOLN: Freq: Once | INTRAVENOUS | Status: AC

## 2021-11-25 MED ORDER — SODIUM CHLORIDE 0.9 % IV SOLN
150.0000 mg | Freq: Once | INTRAVENOUS | Status: AC
  Administered 2021-11-25: 150 mg via INTRAVENOUS
  Filled 2021-11-25: qty 150

## 2021-11-25 MED ORDER — SODIUM CHLORIDE 0.9 % IV SOLN
1500.0000 mg | Freq: Once | INTRAVENOUS | Status: AC
  Administered 2021-11-25: 1500 mg via INTRAVENOUS
  Filled 2021-11-25: qty 30

## 2021-11-25 MED ORDER — SODIUM CHLORIDE 0.9 % IV SOLN
500.0000 mg/m2 | Freq: Once | INTRAVENOUS | Status: AC
  Administered 2021-11-25: 950 mg via INTRAVENOUS
  Filled 2021-11-25: qty 24.98

## 2021-11-25 MED ORDER — HEPARIN SOD (PORK) LOCK FLUSH 100 UNIT/ML IV SOLN
500.0000 [IU] | Freq: Once | INTRAVENOUS | Status: AC | PRN
  Administered 2021-11-25: 500 [IU]

## 2021-11-25 MED ORDER — MAGNESIUM SULFATE 2 GM/50ML IV SOLN
2.0000 g | Freq: Once | INTRAVENOUS | Status: AC
  Administered 2021-11-25: 2 g via INTRAVENOUS
  Filled 2021-11-25: qty 50

## 2021-11-25 MED ORDER — SODIUM CHLORIDE 0.9 % IV SOLN
25.0000 mg/m2 | Freq: Once | INTRAVENOUS | Status: AC
  Administered 2021-11-25: 48 mg via INTRAVENOUS
  Filled 2021-11-25: qty 48

## 2021-11-25 MED ORDER — SODIUM CHLORIDE 0.9% FLUSH
10.0000 mL | INTRAVENOUS | Status: DC | PRN
  Administered 2021-11-25 (×2): 10 mL

## 2021-11-25 NOTE — Progress Notes (Signed)
Patients port flushed without difficulty.  Good blood return noted with no bruising or swelling noted at site.  Stable during access and blood draw.  Patient to remain accessed for treatment. 

## 2021-11-25 NOTE — Patient Instructions (Signed)
[place] at [facility] ?Discharge Instructions ? ?You were seen and examined today by Dr. Delton Coombes.  ? ?Proceed with treatment today. There is no change needed based on your lab work from today. ? ?Follow-up as scheduled with a CT scan prior to your next cycle of treatment. ? ? ?Thank you for choosing [place] at [facility] to provide your oncology and hematology care.  To afford each patient quality time with our provider, please arrive at least 15 minutes before your scheduled appointment time.  ? ?If you have a lab appointment with the [place] please come in thru the Main Entrance and check in at the main information desk. ? ?You need to re-schedule your appointment should you arrive 10 or more minutes late.  We strive to give you quality time with our providers, and arriving late affects you and other patients whose appointments are after yours.  Also, if you no show three or more times for appointments you may be dismissed from the clinic at the providers discretion.     ?Again, thank you for choosing [facility].  Our hope is that these requests will decrease the amount of time that you wait before being seen by our physicians.       ?_____________________________________________________________ ? ?Should you have questions after your visit to [facility], please contact our office at [phone] and follow the prompts.  Our office hours are 8:00 a.m. and 4:30 p.m. Monday - Friday.  Please note that voicemails left after 4:00 p.m. may not be returned until the following business day.  We are closed weekends and major holidays.  You do have access to a nurse 24-7, just call the main number to the clinic [phone] and do not press any options, hold on the line and a nurse will answer the phone.   ? ?For prescription refill requests, have your pharmacy contact our office and allow 72 hours.   ? ?Due to Covid, you will need  to wear a mask upon entering the hospital. If you do not have a mask, a mask will be given to you at the Main Entrance upon arrival. For doctor visits, patients may have 1 support person age 28 or older with them. For treatment visits, patients can not have anyone with them due to social distancing guidelines and our immunocompromised population.  ? ? ? ?

## 2021-11-25 NOTE — Patient Instructions (Signed)
[place]  Discharge Instructions: ?Thank you for choosing [place] to provide your oncology and hematology care.  ?If you have a lab appointment with the [place], please come in thru the Main Entrance and check in at the main information desk. ? ?Wear comfortable clothing and clothing appropriate for easy access to any Portacath or PICC line.  ? ?We strive to give you quality time with your provider. You may need to reschedule your appointment if you arrive late (15 or more minutes).  Arriving late affects you and other patients whose appointments are after yours.  Also, if you miss three or more appointments without notifying the office, you may be dismissed from the clinic at the provider?s discretion.    ?  ?For prescription refill requests, have your pharmacy contact our office and allow 72 hours for refills to be completed.   ? ?Today you received the following chemotherapy and/or immunotherapy agents Imfinzi, Gemzar, and Cisplatin, return as scheduled. ?  ?To help prevent nausea and vomiting after your treatment, we encourage you to take your nausea medication as directed. ? ?BELOW ARE SYMPTOMS THAT SHOULD BE REPORTED IMMEDIATELY: ?*FEVER GREATER THAN 100.4 F (38 ?C) OR HIGHER ?*CHILLS OR SWEATING ?*NAUSEA AND VOMITING THAT IS NOT CONTROLLED WITH YOUR NAUSEA MEDICATION ?*UNUSUAL SHORTNESS OF BREATH ?*UNUSUAL BRUISING OR BLEEDING ?*URINARY PROBLEMS (pain or burning when urinating, or frequent urination) ?*BOWEL PROBLEMS (unusual diarrhea, constipation, pain near the anus) ?TENDERNESS IN MOUTH AND THROAT WITH OR WITHOUT PRESENCE OF ULCERS (sore throat, sores in mouth, or a toothache) ?UNUSUAL RASH, SWELLING OR PAIN  ?UNUSUAL VAGINAL DISCHARGE OR ITCHING  ? ?Items with * indicate a potential emergency and should be followed up as soon as possible or go to the Emergency Department if any problems should occur. ? ?Please show the CHEMOTHERAPY ALERT CARD or IMMUNOTHERAPY ALERT  CARD at check-in to the Emergency Department and triage nurse. ? ?Should you have questions after your visit or need to cancel or reschedule your appointment, please contact [facility] [phone]  and follow the prompts.  Office hours are 8:00 a.m. to 4:30 p.m. Monday - Friday. Please note that voicemails left after 4:00 p.m. may not be returned until the following business day.  We are closed weekends and major holidays. You have access to a nurse at all times for urgent questions. Please call the main number to the clinic [phone] and follow the prompts. ? ?For any non-urgent questions, you may also contact your provider using MyChart. We now offer e-Visits for anyone 41 and older to request care online for non-urgent symptoms. For details visit [email or web address]. ?  ?Also download the MyChart app! Go to the app store, search "MyChart", open the app, select [place], and log in with your MyChart username and password. ? ?Due to Covid, a mask is required upon entering the hospital/clinic. If you do not have a mask, one will be given to you upon arrival. For doctor visits, patients may have 1 support person aged 27 or older with them. For treatment visits, patients cannot have anyone with them due to current Covid guidelines and our immunocompromised population.  ?

## 2021-11-25 NOTE — Progress Notes (Signed)
Patient and labs assessed by Dr. Delton Coombes today, patient okay to proceed with treatment. ?Patient tolerated chemotherapy with no complaints voiced. Side effects with management reviewed understanding verbalized. Port site clean and dry with no bruising or swelling noted at site. Good blood return noted before and after administration of chemotherapy. Band aid applied. Patient left in satisfactory condition with VSS and no s/s of distress noted.  ?

## 2021-11-25 NOTE — Progress Notes (Signed)
Chaplain engaged in an initial visit with Dawit and his wife.  They shared about being married over 24 years and the ways they have learned to communicate with other.  Lavel's wife also shared about how Savoy's personality has changed significantly through his healthcare journey.  She voiced that he was once very outspoken and extroverted but is now more quiet and inward thinking.  Jashun talked about how his life has changed a lot and how it just makes him think a lot more.  Chaplain checked in on how Demarian has been feeling with taking chemo and he stated that he has no energy and no strength.  Mrs. Palma voiced that he has lost a lot of weight.   ? ?The Williard's declared that they depend on each other and their faith throughout this journey.  They recognize that life is not promised and are doing what they can while they can.  Chaplain assesses that there is a lot on Oluwaseyi's mind concerning his health, life, and death.  He has a birthday coming up next Tuesday and Chaplain believes it will be good to celebrate Demosthenes and ensure he feels special and loved on his day.  Mrs. Lesli Albee is planning a party for him on Sunday that will take place outside to less any burden of [place] becoming sick or exposed to any germs by family and friends.  She is hoping that will uplift him.  ? ?Chaplain offered reflective listening, support, and presence. ? ? ? 11/25/21 1000  ?Clinical Encounter Type  ?Visited With Patient and family together  ?Visit Type Initial;Spiritual support  ? ? ?

## 2021-11-25 NOTE — Progress Notes (Signed)
? ?[facility] ?[address] ?[place], [place] [address] ? ? ?CLINIC:  ?Medical Oncology/Hematology ? ?PCP:  ?[name], PA-C ?[address] [place] [address] ?[phone] ? ? ?REASON FOR VISIT:  ?Follow-up for intrahepatic cholangiocarcinoma ? ?PRIOR THERAPY: none ? ?NGS Results: not done ? ?CURRENT THERAPY: Cisplatin + Gemcitabine D1,8 q21d ? ?BRIEF ONCOLOGIC HISTORY:  ?Oncology History  ?Cholangiocarcinoma ([place])  ?02/28/2021 Initial Diagnosis  ? Cholangiocarcinoma ([place]) ? ?  ?07/17/2021 Cancer Staging  ? Staging form: Perihilar Bile Ducts, AJCC 8th Edition ?- Clinical stage from 07/17/2021: Stage IVB (cTX, cNX, pM1) - Signed by Derek Jack, MD on 07/17/2021 ?Stage prefix: Initial diagnosis ? ?  ?07/22/2021 -  Chemotherapy  ? Patient is on Treatment Plan : BILIARY TRACT Cisplatin + Gemcitabine D1,8 q21d  ? ?  ?  ? ? ?CANCER STAGING: ? Cancer Staging  ?Cholangiocarcinoma ([place]) ?Staging form: Perihilar Bile Ducts, AJCC 8th Edition ?- Clinical stage from 07/17/2021: Stage IVB (cTX, cNX, pM1) - Signed by Derek Jack, MD on 07/17/2021 ? ? ?INTERVAL HISTORY:  ?[name], a 66 y.o. male, returns for routine follow-up and consideration for next cycle of chemotherapy. [name] was last seen on 11/04/2021. ? ?Due for cycle #7 of Cisplatin + Gemcitabine today.  ? ?Overall, he tells me he has been feeling pretty well. He has gained 3 lbs. He reports dizziness upon standing up in the mornings. He reports slow urination for which he has been taking Flomax which has not helped. He reports fatigue. His appetite has improved. He denies n/v/d, new pain, numbness/tingling, and abdominal pain. He denies dry cough and SOB.  ? ?Overall, he feels ready for next cycle of chemo today.  ? ? ?REVIEW OF SYSTEMS:  ?Review of Systems  ?Constitutional:  Positive for fatigue. Negative for appetite change and unexpected weight change.  ?Respiratory:  Negative for cough and shortness of  breath.   ?Gastrointestinal:  Negative for abdominal pain, diarrhea, nausea and vomiting.  ?Genitourinary:  Positive for difficulty urinating (slow) and frequency.   ?Neurological:  Positive for dizziness. Negative for numbness.  ?Psychiatric/Behavioral:  Positive for sleep disturbance.   ?All other systems reviewed and are negative. ? ?PAST MEDICAL/SURGICAL HISTORY:  ?Past Medical History:  ?Diagnosis Date  ? Cancer [facility])   ? CHF (congestive heart failure) ([place])   ? Chronic pain   ? neck, back, knees  ? Class 1 obesity due to excess calories with body mass index (BMI) of 31.0 to 31.9 in adult   ? Claudication [facility])   ? Coronary artery disease   ? DDD (degenerative disc disease), cervical   ? Diabetes mellitus ([place])   ? Dyslipidemia   ? Hypertension   ? Port-A-Cath in place 07/20/2021  ? Tobacco dependence   ? ?Past Surgical History:  ?Procedure Laterality Date  ? BILIARY BRUSHING N/A 02/25/2021  ? Procedure: BILIARY BRUSHING;  Surgeon: Rogene Houston, MD;  Location: [facility];  Service: Gastroenterology;  Laterality: N/A;  ? BILIARY STENT PLACEMENT N/A 02/25/2021  ? Procedure: BILIARY STENT PLACEMENT;  Surgeon: Rogene Houston, MD;  Location: [facility];  Service: Gastroenterology;  Laterality: N/A;  ? BILIARY STENT PLACEMENT  02/27/2021  ? Procedure: BILIARY STENT PLACEMENT (10FR x 9cm) IN THE RIGHT SYSTEM;  Surgeon: Rogene Houston, MD;  Location: [facility];  Service: Gastroenterology;;  ? BREAST SURGERY Left   ? benign lump- in his 68s.  ? CARDIAC CATHETERIZATION    ? ERCP N/A  02/25/2021  ? Procedure: ENDOSCOPIC RETROGRADE CHOLANGIOPANCREATOGRAPHY (ERCP);  Surgeon: Rogene Houston, MD;  Location: [facility];  Service: Gastroenterology;  Laterality: N/A;  ? ERCP N/A 02/27/2021  ? Procedure: ENDOSCOPIC RETROGRADE CHOLANGIOPANCREATOGRAPHY (ERCP);  Surgeon: Rogene Houston, MD;  Location: [facility];  Service: Gastroenterology;  Laterality: N/A;  ? IR IMAGING GUIDED PORT INSERTION  07/21/2021  ? KNEE SURGERY Left   ? x2  ? LEFT  HEART CATH AND CORONARY ANGIOGRAPHY N/A 09/08/2021  ? Procedure: LEFT HEART CATH AND CORONARY ANGIOGRAPHY;  Surgeon: Early Osmond, MD;  Location: [facility];  Service: Cardiovascular;  Laterality: N/A;  ? SPHINCTEROTOMY N/A 02/25/2021  ? Procedure: SPHINCTEROTOMY;  Surgeon: Rogene Houston, MD;  Location: [facility];  Service: Gastroenterology;  Laterality: N/A;  ? STENT REMOVAL  02/27/2021  ? Procedure: STENT REMOVAL (8.5Fr x 9cm);  Surgeon: Rogene Houston, MD;  Location: [facility];  Service: Gastroenterology;;  ? ? ?SOCIAL HISTORY:  ?Social History  ? ?Socioeconomic History  ? Marital status: Married  ?  Spouse name: Not on file  ? Number of children: Not on file  ? Years of education: Not on file  ? Highest education level: Not on file  ?Occupational History  ? 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 Smoking status: Every Day  ?  Packs/day: 0.50  ?  Years: 56.00  ?  Pack years: 28.00  ?  Types: Cigarettes  ? Smokeless tobacco: Never  ?Vaping Use  ? Vaping Use: Never used  ?Substance and Sexual Activity  ? Alcohol use: Not Currently  ?  Comment: stopped 2.5 years ago 03/11/21  ? Drug use: Never  ? Sexual activity: Not on file  ?Other Topics Concern  ? Not on file  ?Social History Narrative  ? Not on file  ? ?Social Determinants of Health  ? ?Financial Resource Strain: Medium Risk  ? Difficulty of Paying Living Expenses: Somewhat hard  ?Food Insecurity: No Food Insecurity  ? Worried About Charity fundraiser in the Last Year: Never true  ? Ran Out of Food in the Last Year: Never true  ?Transportation Needs: No Transportation Needs  ? Lack of Transportation (Medical): No  ? Lack of Transportation (Non-Medical): No  ?Physical Activity: Insufficiently Active  ? Days of Exercise per Week: 2 days  ? Minutes of Exercise per Session: 20 min  ?Stress: No Stress Concern Present  ? Feeling of Stress : Only a little  ?Social Connections: Moderately Integrated  ? Frequency of Communication with Friends and  Family: More than three times a week  ? Frequency of Social Gatherings with Friends and Family: More than three times a week  ? Attends Religious Services: More than 4 times per year  ? Active Member of Clubs or Organizations: No  ? Attends Archivist Meetings: Never  ? Marital Status: Married  ?Intimate Partner Violence: Not At Risk  ? Fear of Current or Ex-Partner: No  ? Emotionally Abused: No  ? Physically Abused: No  ? Sexually Abused: No  ? ? ?FAMILY HISTORY:  ?Family History  ?Problem Relation Age of Onset  ? CAD Mother   ? Cancer Neg Hx   ? ? ?CURRENT MEDICATIONS:  ?Current Outpatient Medications  ?Medication Sig Dispense Refill  ? acetaminophen (TYLENOL) 500 MG tablet Take 1,000 mg by mouth every 6 (six) hours as needed for moderate pain or headache.    ? atorvastatin (LIPITOR) 40 MG tablet Take 1 tablet (40 mg  total) by mouth daily at 6 PM. 90 tablet 3  ? CISPLATIN IV Inject into the vein once a week. Days 1, 8 every 21 days    ? clopidogrel (PLAVIX) 75 MG tablet Take 1 tablet (75 mg total) by mouth daily with breakfast. 90 tablet 3  ? Dextromethorphan-guaiFENesin (ROBITUSSIN DM PO) Take 1 Dose by mouth at bedtime as needed (allergy flare up). 2 tsp at night as needed for allergy flare ups    ? Durvalumab (IMFINZI IV) Inject into the vein every 21 ( twenty-one) days.    ? famotidine (PEPCID) 20 MG tablet Take 20 mg by mouth daily.    ? ferrous sulfate (FERROUSUL) 325 (65 FE) MG tablet Take 1 tablet (325 mg total) by mouth daily with breakfast.    ? furosemide (LASIX) 20 MG tablet Take 20 mg daily as needed for swelling 90 tablet 1  ? Gemcitabine HCl (GEMZAR IV) Inject into the vein once a week. Days 1, 8 every 21 days    ? lidocaine-prilocaine (EMLA) cream Apply a small amount to port a cath site and cover with plastic wrap 1 hour prior to chemotherapy appointments 30 g 3  ? loratadine (CLARITIN) 10 MG tablet Take 10 mg by mouth daily. Takes for a few days after shot to boost WBC. About every 2nd  treatment    ? megestrol (MEGACE) 400 MG/10ML suspension Take 10 mLs (400 mg total) by mouth 2 (two) times daily. 480 mL 0  ? metFORMIN (GLUCOPHAGE) 500 MG tablet Take 1 tablet (500 mg total) by mouth 2 (

## 2021-11-26 ENCOUNTER — Other Ambulatory Visit: Payer: Self-pay

## 2021-11-26 ENCOUNTER — Emergency Department (HOSPITAL_COMMUNITY): Payer: Medicare Other

## 2021-11-26 ENCOUNTER — Encounter (HOSPITAL_COMMUNITY): Payer: Self-pay | Admitting: Emergency Medicine

## 2021-11-26 ENCOUNTER — Inpatient Hospital Stay (HOSPITAL_COMMUNITY)
Admission: EM | Admit: 2021-11-26 | Discharge: 2021-11-29 | DRG: 291 | Disposition: A | Discharge status: Home / Self Care | Payer: Medicare Other | Attending: Internal Medicine | Admitting: Internal Medicine

## 2021-11-26 DIAGNOSIS — F17210 Nicotine dependence, cigarettes, uncomplicated: Secondary | ICD-10-CM | POA: Diagnosis present

## 2021-11-26 DIAGNOSIS — R778 Other specified abnormalities of plasma proteins: Secondary | ICD-10-CM

## 2021-11-26 DIAGNOSIS — E1151 Type 2 diabetes mellitus with diabetic peripheral angiopathy without gangrene: Secondary | ICD-10-CM | POA: Diagnosis present

## 2021-11-26 DIAGNOSIS — Z7982 Long term (current) use of aspirin: Secondary | ICD-10-CM

## 2021-11-26 DIAGNOSIS — I951 Orthostatic hypotension: Secondary | ICD-10-CM | POA: Diagnosis present

## 2021-11-26 DIAGNOSIS — J181 Lobar pneumonia, unspecified organism: Secondary | ICD-10-CM

## 2021-11-26 DIAGNOSIS — E782 Mixed hyperlipidemia: Secondary | ICD-10-CM

## 2021-11-26 DIAGNOSIS — C221 Intrahepatic bile duct carcinoma: Secondary | ICD-10-CM | POA: Diagnosis present

## 2021-11-26 DIAGNOSIS — Z720 Tobacco use: Secondary | ICD-10-CM

## 2021-11-26 DIAGNOSIS — Z79899 Other long term (current) drug therapy: Secondary | ICD-10-CM

## 2021-11-26 DIAGNOSIS — C799 Secondary malignant neoplasm of unspecified site: Secondary | ICD-10-CM | POA: Diagnosis present

## 2021-11-26 DIAGNOSIS — E871 Hypo-osmolality and hyponatremia: Secondary | ICD-10-CM | POA: Diagnosis present

## 2021-11-26 DIAGNOSIS — R0602 Shortness of breath: Principal | ICD-10-CM

## 2021-11-26 DIAGNOSIS — Z7902 Long term (current) use of antithrombotics/antiplatelets: Secondary | ICD-10-CM

## 2021-11-26 DIAGNOSIS — I2510 Atherosclerotic heart disease of native coronary artery without angina pectoris: Secondary | ICD-10-CM

## 2021-11-26 DIAGNOSIS — R7989 Other specified abnormal findings of blood chemistry: Secondary | ICD-10-CM | POA: Diagnosis present

## 2021-11-26 DIAGNOSIS — I5023 Acute on chronic systolic (congestive) heart failure: Secondary | ICD-10-CM | POA: Diagnosis present

## 2021-11-26 DIAGNOSIS — I110 Hypertensive heart disease with heart failure: Principal | ICD-10-CM | POA: Diagnosis present

## 2021-11-26 DIAGNOSIS — E118 Type 2 diabetes mellitus with unspecified complications: Secondary | ICD-10-CM

## 2021-11-26 DIAGNOSIS — Z7984 Long term (current) use of oral hypoglycemic drugs: Secondary | ICD-10-CM

## 2021-11-26 DIAGNOSIS — I252 Old myocardial infarction: Secondary | ICD-10-CM

## 2021-11-26 DIAGNOSIS — J9601 Acute respiratory failure with hypoxia: Secondary | ICD-10-CM

## 2021-11-26 DIAGNOSIS — J9811 Atelectasis: Secondary | ICD-10-CM | POA: Diagnosis present

## 2021-11-26 DIAGNOSIS — E785 Hyperlipidemia, unspecified: Secondary | ICD-10-CM | POA: Diagnosis present

## 2021-11-26 DIAGNOSIS — Z20822 Contact with and (suspected) exposure to covid-19: Secondary | ICD-10-CM | POA: Diagnosis present

## 2021-11-26 DIAGNOSIS — Z8249 Family history of ischemic heart disease and other diseases of the circulatory system: Secondary | ICD-10-CM

## 2021-11-26 DIAGNOSIS — I214 Non-ST elevation (NSTEMI) myocardial infarction: Secondary | ICD-10-CM

## 2021-11-26 DIAGNOSIS — I5022 Chronic systolic (congestive) heart failure: Secondary | ICD-10-CM

## 2021-11-26 LAB — BASIC METABOLIC PANEL
Anion gap: 8 (ref 5–15)
BUN: 22 mg/dL (ref 8–23)
CO2: 21 mmol/L — ABNORMAL LOW (ref 22–32)
Calcium: 8.7 mg/dL — ABNORMAL LOW (ref 8.9–10.3)
Chloride: 104 mmol/L (ref 98–111)
Creatinine, Ser: 1 mg/dL (ref 0.61–1.24)
GFR, Estimated: >60 mL/min (ref >60)
Glucose, Bld: 145 mg/dL — ABNORMAL HIGH (ref 70–99)
Potassium: 4.3 mmol/L (ref 3.5–5.1)
Sodium: 133 mmol/L — ABNORMAL LOW (ref 135–145)

## 2021-11-26 LAB — CBC
HCT: 26.9 % — ABNORMAL LOW (ref 39.0–52.0)
Hemoglobin: 9 g/dL — ABNORMAL LOW (ref 13.0–17.0)
MCH: 35.3 pg — ABNORMAL HIGH (ref 26.0–34.0)
MCHC: 33.5 g/dL (ref 30.0–36.0)
MCV: 105.5 fL — ABNORMAL HIGH (ref 80.0–100.0)
Platelets: 167 10*3/uL (ref 150–400)
RBC: 2.55 MIL/uL — ABNORMAL LOW (ref 4.22–5.81)
RDW: 20 % — ABNORMAL HIGH (ref 11.5–15.5)
WBC: 19 10*3/uL — ABNORMAL HIGH (ref 4.0–10.5)
nRBC: 0 % (ref 0.0–0.2)

## 2021-11-26 LAB — HEPATIC FUNCTION PANEL
ALT: 55 U/L — ABNORMAL HIGH (ref 0–44)
AST: 76 U/L — ABNORMAL HIGH (ref 15–41)
Albumin: 3.1 g/dL — ABNORMAL LOW (ref 3.5–5.0)
Alkaline Phosphatase: 360 U/L — ABNORMAL HIGH (ref 38–126)
Bilirubin, Direct: 0.7 mg/dL — ABNORMAL HIGH (ref 0.0–0.2)
Indirect Bilirubin: 0.6 mg/dL (ref 0.3–0.9)
Total Bilirubin: 1.3 mg/dL — ABNORMAL HIGH (ref 0.3–1.2)
Total Protein: 6.6 g/dL (ref 6.5–8.1)

## 2021-11-26 LAB — DIFFERENTIAL
Abs Immature Granulocytes: 0.2 10*3/uL — ABNORMAL HIGH (ref 0.00–0.07)
Basophils Absolute: 0 10*3/uL (ref 0.0–0.1)
Basophils Relative: 0 %
Eosinophils Absolute: 0 10*3/uL (ref 0.0–0.5)
Eosinophils Relative: 0 %
Immature Granulocytes: 1 %
Lymphocytes Relative: 5 %
Lymphs Abs: 1 10*3/uL (ref 0.7–4.0)
Monocytes Absolute: 1 10*3/uL (ref 0.1–1.0)
Monocytes Relative: 5 %
Neutro Abs: 16.8 10*3/uL — ABNORMAL HIGH (ref 1.7–7.7)
Neutrophils Relative %: 89 %

## 2021-11-26 LAB — CANCER ANTIGEN 19-9: CA 19-9: 406 U/mL — ABNORMAL HIGH (ref 0–35)

## 2021-11-26 LAB — RESP PANEL BY RT-PCR (FLU A&B, COVID) ARPGX2
Influenza A by PCR: NEGATIVE
Influenza B by PCR: NEGATIVE
SARS Coronavirus 2 by RT PCR: NEGATIVE

## 2021-11-26 LAB — BRAIN NATRIURETIC PEPTIDE: B Natriuretic Peptide: 518 pg/mL — ABNORMAL HIGH (ref 0.0–100.0)

## 2021-11-26 LAB — LIPASE, BLOOD: Lipase: 67 U/L — ABNORMAL HIGH (ref 11–51)

## 2021-11-26 LAB — TROPONIN I (HIGH SENSITIVITY): Troponin I (High Sensitivity): 60 ng/L — ABNORMAL HIGH (ref <18)

## 2021-11-26 MED ORDER — METHYLPREDNISOLONE SODIUM SUCC 125 MG IJ SOLR
125.0000 mg | Freq: Once | INTRAMUSCULAR | Status: AC
  Administered 2021-11-26: 125 mg via INTRAVENOUS
  Filled 2021-11-26: qty 2

## 2021-11-26 MED ORDER — IPRATROPIUM-ALBUTEROL 0.5-2.5 (3) MG/3ML IN SOLN
3.0000 mL | Freq: Once | RESPIRATORY_TRACT | Status: AC
  Administered 2021-11-26: 3 mL via RESPIRATORY_TRACT

## 2021-11-26 MED ORDER — SODIUM CHLORIDE 0.9 % IV SOLN: INTRAVENOUS | Status: DC

## 2021-11-26 MED ORDER — IPRATROPIUM-ALBUTEROL 0.5-2.5 (3) MG/3ML IN SOLN
RESPIRATORY_TRACT | Status: AC
  Filled 2021-11-26: qty 3

## 2021-11-26 NOTE — ED Triage Notes (Signed)
Pt c/o sudden onset of sob and cough. Pt states he mowed today and also fell off a cooler injuring the right side.  ?

## 2021-11-26 NOTE — Progress Notes (Signed)
Rt called to assess patient to spo2 88% on 4lpm cann pt hr 137-140 rr 25-28 pt rhonchus and respiratory distress at rest. Rt order Duoneb x 1 pt spo2 94 after treatment hr still increased and still rhonchus. Pt has congested cough and significant smoking history ?

## 2021-11-26 NOTE — ED Notes (Signed)
Pt taken off oxygen for a few minutes to assess if oxygen dropped- O2 levels decreased to 87% on room air- pt placed back on 2L O2 via [place] ?

## 2021-11-26 NOTE — ED Notes (Signed)
Inquired with DR Rogene Houston that he did want IV fluids '@100ml'$ /hr on this pt as he has hx of CHF- instructed to give fluids as ordered. ?

## 2021-11-26 NOTE — ED Notes (Signed)
Pt was prescribed Lasix by heart Dr per spouse- said they should only give it "as needed when his ankles swell"   ?

## 2021-11-26 NOTE — ED Provider Notes (Signed)
?[facility] ?Provider Note ? ? ?CSN: [id number] ?Arrival date & time: 11/26/21  2037 ? ?  ? ?History ? ?Chief Complaint  ?Patient presents with  ? Shortness of Breath  ? ? ?Jonathan Keith is a 66 y.o. male. ? ?Patient with acute on sat shortness of breath cough and family says sound like he was wheezing just tonight right before he came in.  Patient was mowing in the fields today and there also was some plowing going on so there was a lot of dust.  Patient here placed on 4 L of oxygen.  Patient not normally on any oxygen.  Patient's heart rate was around 140.  Initial oxygen sats were 82%.  On 4 L he is 94%.  Respiratory rate up no fever.  EKG and cardiac monitor shows sinus tachycardia.  Patient does not have a history of COPD or asthma or emphysema but he does have a history of congestive heart failure.  No leg swelling.  States he felt fine earlier in the day.  He is also had a productive cough since this started.  Denies any chest pain does have a little bit of right-sided anterior chest pain when he coughs.  No leg swelling.  No abdominal pain no nausea or vomiting. ? ?Past medical history is significant for cholangiocarcinoma diagnosed in December 2022 being followed by Dr. Raliegh Ip up at hematology oncology here at [facility] he is on there is due for cycle #7 of cisplatin and Gemcitabine which was given yesterday.  Past medical history significant for chronic pain hypertension diabetes tobacco dependence still an everyday smoker has a Port-A-Cath in place for the treatments coronary artery disease congestive heart failure.  In addition patient's past medical history significant for a non-STEMI in January.  Had cardiac cath.  Did have significant disease but they opted for medical management because of his overall condition. ? ? ?  ? ?Home Medications ?Prior to Admission medications   ?Medication Sig Start Date End Date Taking? Authorizing Provider  ?acetaminophen (TYLENOL) 500 MG tablet Take  500 mg by mouth every 6 (six) hours as needed for moderate pain or headache.   Yes [provider]  ?aspirin EC 81 MG tablet Take 81 mg by mouth daily. Swallow whole.   Yes [provider]  ?atorvastatin (LIPITOR) 40 MG tablet Take 1 tablet (40 mg total) by mouth daily at 6 PM. 09/12/21  Yes Duke, Tami Lin, [place]  ?CISPLATIN IV Inject into the vein once a week. Days 1, 8 every 21 days 07/22/21  Yes [provider]  ?clopidogrel (PLAVIX) 75 MG tablet Take 1 tablet (75 mg total) by mouth daily with breakfast. 09/13/21  Yes Duke, Tami Lin, PA  ?famotidine (PEPCID) 20 MG tablet Take 20 mg by mouth daily.   Yes [provider]  ?ferrous sulfate (FERROUSUL) 325 (65 FE) MG tablet Take 1 tablet (325 mg total) by mouth daily with breakfast. 03/18/21  Yes Rehman, Mechele Dawley, MD  ?furosemide (LASIX) 20 MG tablet Take 20 mg daily as needed for swelling 11/05/21  Yes Pemberton, Greer Ee, MD  ?Gemcitabine HCl (GEMZAR IV) Inject into the vein once a week. Days 1, 8 every 21 days 07/22/21  Yes [provider]  ?Ginseng 100 MG CAPS Take 1 capsule by mouth daily.   Yes [provider]  ?lidocaine-prilocaine (EMLA) cream Apply a small amount to port a cath site and cover with plastic wrap 1 hour prior to chemotherapy appointments 07/21/21  Yes Derek Jack, MD  ?loratadine (CLARITIN) 10 MG tablet Take 10 mg by mouth daily. Takes for a few days after shot to boost WBC. About every 2nd treatment   Yes [provider]  ?metFORMIN (GLUCOPHAGE) 500 MG tablet Take 1 tablet (500 mg total) by mouth 2 (two) times daily with a meal. 05/29/21 11/26/21 Yes Dessa Phi, DO  ?midodrine (PROAMATINE) 5 MG tablet Take 1 tablet (5 mg total) by mouth 3 (three) times daily with meals. 09/12/21  Yes Duke, Tami Lin, PA  ?nitroGLYCERIN (NITROSTAT) 0.4 MG SL tablet Place 1 tablet (0.4 mg total) under the tongue every 5 (five) minutes as needed for chest pain. 09/12/21 09/12/22 Yes [facility],  Tami Lin, PA  ?pantoprazole (PROTONIX) 40 MG tablet Take 1 tablet (40 mg total) by mouth daily. 09/12/21  Yes [facility], Tami Lin, PA  ?prochlorperazine (COMPAZINE) 10 MG tablet Take 1 tablet (10 mg total) by mouth every 6 (six) hours as needed (Nausea or vomiting). 07/21/21  Yes Derek Jack, MD  ?tamsulosin (FLOMAX) 0.4 MG CAPS capsule TAKE (2) CAPSULES BY MOUTH AT BEDTIME. ?Patient taking differently: 0.4 mg at bedtime. If needed can take additional tablet 11/25/21  Yes Derek Jack, MD  ?Durvalumab (IMFINZI IV) Inject into the vein every 21 ( twenty-one) days. 07/22/21   [provider]  ?megestrol (MEGACE) 400 MG/10ML suspension Take 10 mLs (400 mg total) by mouth 2 (two) times daily. ?Patient not taking: Reported on 11/26/2021 07/17/21   Derek Jack, MD  ?   ? ?Allergies    ?Patient has no known allergies.   ? ?Review of Systems   ?Review of Systems  ?Constitutional:  Negative for chills and fever.  ?HENT:  Negative for ear pain and sore throat.   ?Eyes:  Negative for pain and visual disturbance.  ?Respiratory:  Positive for cough, shortness of breath and wheezing.   ?Cardiovascular:  Positive for chest pain. Negative for palpitations and leg swelling.  ?Gastrointestinal:  Negative for abdominal pain and vomiting.  ?Genitourinary:  Negative for dysuria and hematuria.  ?Musculoskeletal:  Negative for arthralgias and back pain.  ?Skin:  Negative for color change and rash.  ?Neurological:  Negative for seizures and syncope.  ?All other systems reviewed and are negative. ? ?Physical Exam ?Updated Vital Signs ?BP 106/69   Pulse (!) 112   Temp 97.7 ?F (36.5 ?C)   Resp 18   Ht 1.753 m ('5\' 9"'$ )   Wt 70 kg   SpO2 90%   BMI 22.79 kg/m?  ?Physical Exam ?Vitals and nursing note reviewed.  ?Constitutional:   ?   General: He is not in acute distress. ?   Appearance: Normal appearance. He is well-developed.  ?HENT:  ?   Head: Normocephalic and atraumatic.  ?Eyes:  ?   Extraocular  Movements: Extraocular movements intact.  ?   Conjunctiva/sclera: Conjunctivae normal.  ?   Pupils: Pupils are equal, round, and reactive to light.  ?Cardiovascular:  ?   Rate and Rhythm: Regular rhythm. Tachycardia present.  ?   Heart sounds: No murmur heard. ?Pulmonary:  ?   Effort: Respiratory distress present.  ?   Breath sounds: Rhonchi present. No wheezing or rales.  ?Abdominal:  ?   General: There is no distension.  ?   Palpations: Abdomen is soft.  ?   Tenderness: There is no abdominal tenderness.  ?   Comments: Well-healed midline abdominal incision  ?Musculoskeletal:     ?   General: No swelling.  ?  Cervical back: Normal range of motion and neck supple.  ?   Right lower leg: No edema.  ?   Left lower leg: No edema.  ?Skin: ?   General: Skin is warm and dry.  ?   Capillary Refill: Capillary refill takes less than 2 seconds.  ?Neurological:  ?   General: No focal deficit present.  ?   Mental Status: He is alert and oriented to person, place, and time.  ?   Cranial Nerves: No cranial nerve deficit.  ?   Sensory: No sensory deficit.  ?   Motor: No weakness.  ?Psychiatric:     ?   Mood and Affect: Mood normal.  ? ? ?ED Results / Procedures / Treatments   ?Labs ?(all labs ordered are listed, but only abnormal results are displayed) ?Labs Reviewed  ?CBC - Abnormal; Notable for the following components:  ?    Result Value  ? WBC 19.0 (*)   ? RBC 2.55 (*)   ? Hemoglobin 9.0 (*)   ? HCT 26.9 (*)   ? MCV 105.5 (*)   ? MCH 35.3 (*)   ? RDW 20.0 (*)   ? All other components within normal limits  ?BASIC METABOLIC PANEL - Abnormal; Notable for the following components:  ? Sodium 133 (*)   ? CO2 21 (*)   ? Glucose, Bld 145 (*)   ? Calcium 8.7 (*)   ? All other components within normal limits  ?BRAIN NATRIURETIC PEPTIDE - Abnormal; Notable for the following components:  ? B Natriuretic Peptide 518.0 (*)   ? All other components within normal limits  ?LIPASE, BLOOD - Abnormal; Notable for the following components:  ?  Lipase 67 (*)   ? All other components within normal limits  ?HEPATIC FUNCTION PANEL - Abnormal; Notable for the following components:  ? Albumin 3.1 (*)   ? AST 76 (*)   ? ALT 55 (*)   ? Alkaline Phosphatase 360 (*)

## 2021-11-27 ENCOUNTER — Encounter (HOSPITAL_COMMUNITY): Payer: Self-pay | Admitting: Internal Medicine

## 2021-11-27 ENCOUNTER — Emergency Department (HOSPITAL_COMMUNITY): Payer: Medicare Other

## 2021-11-27 ENCOUNTER — Inpatient Hospital Stay (HOSPITAL_COMMUNITY): Payer: Medicare Other

## 2021-11-27 DIAGNOSIS — I5023 Acute on chronic systolic (congestive) heart failure: Secondary | ICD-10-CM | POA: Diagnosis present

## 2021-11-27 DIAGNOSIS — J9601 Acute respiratory failure with hypoxia: Secondary | ICD-10-CM

## 2021-11-27 DIAGNOSIS — E871 Hypo-osmolality and hyponatremia: Secondary | ICD-10-CM | POA: Diagnosis present

## 2021-11-27 DIAGNOSIS — E118 Type 2 diabetes mellitus with unspecified complications: Secondary | ICD-10-CM | POA: Diagnosis not present

## 2021-11-27 DIAGNOSIS — J9811 Atelectasis: Secondary | ICD-10-CM | POA: Diagnosis present

## 2021-11-27 DIAGNOSIS — R0602 Shortness of breath: Secondary | ICD-10-CM | POA: Diagnosis present

## 2021-11-27 DIAGNOSIS — I5043 Acute on chronic combined systolic (congestive) and diastolic (congestive) heart failure: Secondary | ICD-10-CM

## 2021-11-27 DIAGNOSIS — I2510 Atherosclerotic heart disease of native coronary artery without angina pectoris: Secondary | ICD-10-CM

## 2021-11-27 DIAGNOSIS — C799 Secondary malignant neoplasm of unspecified site: Secondary | ICD-10-CM | POA: Diagnosis present

## 2021-11-27 DIAGNOSIS — Z7984 Long term (current) use of oral hypoglycemic drugs: Secondary | ICD-10-CM | POA: Diagnosis not present

## 2021-11-27 DIAGNOSIS — Z8249 Family history of ischemic heart disease and other diseases of the circulatory system: Secondary | ICD-10-CM | POA: Diagnosis not present

## 2021-11-27 DIAGNOSIS — R7989 Other specified abnormal findings of blood chemistry: Secondary | ICD-10-CM

## 2021-11-27 DIAGNOSIS — I5022 Chronic systolic (congestive) heart failure: Secondary | ICD-10-CM

## 2021-11-27 DIAGNOSIS — R778 Other specified abnormalities of plasma proteins: Secondary | ICD-10-CM | POA: Diagnosis not present

## 2021-11-27 DIAGNOSIS — Z720 Tobacco use: Secondary | ICD-10-CM

## 2021-11-27 DIAGNOSIS — J181 Lobar pneumonia, unspecified organism: Secondary | ICD-10-CM | POA: Diagnosis present

## 2021-11-27 DIAGNOSIS — Z7902 Long term (current) use of antithrombotics/antiplatelets: Secondary | ICD-10-CM | POA: Diagnosis not present

## 2021-11-27 DIAGNOSIS — I252 Old myocardial infarction: Secondary | ICD-10-CM | POA: Diagnosis not present

## 2021-11-27 DIAGNOSIS — I951 Orthostatic hypotension: Secondary | ICD-10-CM | POA: Diagnosis present

## 2021-11-27 DIAGNOSIS — Z7982 Long term (current) use of aspirin: Secondary | ICD-10-CM | POA: Diagnosis not present

## 2021-11-27 DIAGNOSIS — C221 Intrahepatic bile duct carcinoma: Secondary | ICD-10-CM

## 2021-11-27 DIAGNOSIS — E1151 Type 2 diabetes mellitus with diabetic peripheral angiopathy without gangrene: Secondary | ICD-10-CM | POA: Diagnosis present

## 2021-11-27 DIAGNOSIS — E785 Hyperlipidemia, unspecified: Secondary | ICD-10-CM | POA: Diagnosis present

## 2021-11-27 DIAGNOSIS — I110 Hypertensive heart disease with heart failure: Secondary | ICD-10-CM | POA: Diagnosis present

## 2021-11-27 DIAGNOSIS — Z79899 Other long term (current) drug therapy: Secondary | ICD-10-CM | POA: Diagnosis not present

## 2021-11-27 DIAGNOSIS — Z20822 Contact with and (suspected) exposure to covid-19: Secondary | ICD-10-CM | POA: Diagnosis present

## 2021-11-27 DIAGNOSIS — F17210 Nicotine dependence, cigarettes, uncomplicated: Secondary | ICD-10-CM | POA: Diagnosis present

## 2021-11-27 LAB — ECHOCARDIOGRAM LIMITED
Calc EF: 37.8 %
Height: 69 in
S' Lateral: 4.2 cm
Single Plane A2C EF: 22.6 %
Single Plane A4C EF: 48.2 %
Weight: 2469.15 oz

## 2021-11-27 LAB — HEPARIN LEVEL (UNFRACTIONATED): Heparin Unfractionated: 0.23 IU/mL — ABNORMAL LOW (ref 0.30–0.70)

## 2021-11-27 LAB — GLUCOSE, CAPILLARY: Glucose-Capillary: 123 mg/dL — ABNORMAL HIGH (ref 70–99)

## 2021-11-27 LAB — TROPONIN I (HIGH SENSITIVITY): Troponin I (High Sensitivity): 232 ng/L (ref <18)

## 2021-11-27 LAB — PROCALCITONIN: Procalcitonin: 0.69 ng/mL

## 2021-11-27 MED ORDER — FERROUS SULFATE 325 (65 FE) MG PO TABS
325.0000 mg | ORAL_TABLET | Freq: Every day | ORAL | Status: DC
  Administered 2021-11-28 – 2021-11-29 (×2): 325 mg via ORAL
  Filled 2021-11-27 (×2): qty 1

## 2021-11-27 MED ORDER — ACETAMINOPHEN 325 MG PO TABS: 650.0000 mg | ORAL_TABLET | ORAL | Status: DC | PRN

## 2021-11-27 MED ORDER — FUROSEMIDE 10 MG/ML IJ SOLN
40.0000 mg | Freq: Once | INTRAMUSCULAR | Status: AC
  Administered 2021-11-27: 40 mg via INTRAVENOUS
  Filled 2021-11-27: qty 4

## 2021-11-27 MED ORDER — LORATADINE 10 MG PO TABS
10.0000 mg | ORAL_TABLET | Freq: Every day | ORAL | Status: DC
  Administered 2021-11-27 – 2021-11-29 (×3): 10 mg via ORAL
  Filled 2021-11-27 (×3): qty 1

## 2021-11-27 MED ORDER — SODIUM CHLORIDE 0.9 % IV SOLN
250.0000 mL | INTRAVENOUS | Status: DC | PRN
  Administered 2021-11-27 – 2021-11-28 (×2): 250 mL via INTRAVENOUS

## 2021-11-27 MED ORDER — IOHEXOL 350 MG/ML SOLN
100.0000 mL | Freq: Once | INTRAVENOUS | Status: AC | PRN
  Administered 2021-11-27: 100 mL via INTRAVENOUS

## 2021-11-27 MED ORDER — ATORVASTATIN CALCIUM 40 MG PO TABS
40.0000 mg | ORAL_TABLET | Freq: Every day | ORAL | Status: DC
  Administered 2021-11-27 – 2021-11-28 (×2): 40 mg via ORAL
  Filled 2021-11-27 (×2): qty 1

## 2021-11-27 MED ORDER — HEPARIN (PORCINE) 25000 UT/250ML-% IV SOLN: 14.0000 [IU]/kg/h | INTRAVENOUS | Status: DC

## 2021-11-27 MED ORDER — HEPARIN (PORCINE) 25000 UT/250ML-% IV SOLN
1000.0000 [IU]/h | INTRAVENOUS | Status: DC
  Administered 2021-11-27: 850 [IU]/h via INTRAVENOUS
  Filled 2021-11-27: qty 250

## 2021-11-27 MED ORDER — SODIUM CHLORIDE 0.9% FLUSH: 3.0000 mL | INTRAVENOUS | Status: DC | PRN

## 2021-11-27 MED ORDER — SODIUM CHLORIDE 0.9 % IV SOLN
1.0000 g | INTRAVENOUS | Status: DC
  Administered 2021-11-28: 1 g via INTRAVENOUS
  Filled 2021-11-27 (×2): qty 10

## 2021-11-27 MED ORDER — HEPARIN BOLUS VIA INFUSION: 4000.0000 [IU] | Freq: Once | INTRAVENOUS | Status: DC

## 2021-11-27 MED ORDER — ONDANSETRON HCL 4 MG/2ML IJ SOLN: 4.0000 mg | Freq: Four times a day (QID) | INTRAMUSCULAR | Status: DC | PRN

## 2021-11-27 MED ORDER — HEPARIN BOLUS VIA INFUSION
1000.0000 [IU] | Freq: Once | INTRAVENOUS | Status: AC
  Administered 2021-11-27: 1000 [IU] via INTRAVENOUS

## 2021-11-27 MED ORDER — ENOXAPARIN SODIUM 80 MG/0.8ML IJ SOSY
70.0000 mg | PREFILLED_SYRINGE | Freq: Once | INTRAMUSCULAR | Status: AC
  Administered 2021-11-27: 70 mg via SUBCUTANEOUS
  Filled 2021-11-27: qty 0.8

## 2021-11-27 MED ORDER — SODIUM CHLORIDE 0.9% FLUSH
3.0000 mL | Freq: Two times a day (BID) | INTRAVENOUS | Status: DC
  Administered 2021-11-27 – 2021-11-28 (×4): 3 mL via INTRAVENOUS

## 2021-11-27 MED ORDER — TAMSULOSIN HCL 0.4 MG PO CAPS
0.8000 mg | ORAL_CAPSULE | Freq: Every day | ORAL | Status: DC
  Administered 2021-11-27 – 2021-11-28 (×2): 0.8 mg via ORAL
  Filled 2021-11-27 (×2): qty 2

## 2021-11-27 MED ORDER — TAMSULOSIN HCL 0.4 MG PO CAPS: 0.4000 mg | ORAL_CAPSULE | Freq: Every day | ORAL | Status: DC

## 2021-11-27 MED ORDER — ENOXAPARIN SODIUM 80 MG/0.8ML IJ SOSY
70.0000 mg | PREFILLED_SYRINGE | Freq: Two times a day (BID) | INTRAMUSCULAR | Status: DC
  Administered 2021-11-27 – 2021-11-28 (×3): 70 mg via SUBCUTANEOUS
  Filled 2021-11-27 (×4): qty 0.8

## 2021-11-27 MED ORDER — PANTOPRAZOLE SODIUM 40 MG PO TBEC
40.0000 mg | DELAYED_RELEASE_TABLET | Freq: Every day | ORAL | Status: DC
  Administered 2021-11-27 – 2021-11-29 (×3): 40 mg via ORAL
  Filled 2021-11-27 (×3): qty 1

## 2021-11-27 MED ORDER — HEPARIN SODIUM (PORCINE) 5000 UNIT/ML IJ SOLN
4000.0000 [IU] | Freq: Once | INTRAMUSCULAR | Status: AC
  Administered 2021-11-27: 4000 [IU] via INTRAVENOUS

## 2021-11-27 MED ORDER — FUROSEMIDE 10 MG/ML IJ SOLN
20.0000 mg | Freq: Every day | INTRAMUSCULAR | Status: DC
  Administered 2021-11-27 – 2021-11-28 (×2): 20 mg via INTRAVENOUS
  Filled 2021-11-27 (×3): qty 2

## 2021-11-27 MED ORDER — CLOPIDOGREL BISULFATE 75 MG PO TABS
75.0000 mg | ORAL_TABLET | Freq: Every day | ORAL | Status: DC
  Administered 2021-11-28 – 2021-11-29 (×2): 75 mg via ORAL
  Filled 2021-11-27 (×2): qty 1

## 2021-11-27 MED ORDER — AZITHROMYCIN 250 MG PO TABS
500.0000 mg | ORAL_TABLET | Freq: Every day | ORAL | Status: DC
  Administered 2021-11-27 – 2021-11-28 (×2): 500 mg via ORAL
  Filled 2021-11-27 (×4): qty 2

## 2021-11-27 MED ORDER — MIDODRINE HCL 5 MG PO TABS
5.0000 mg | ORAL_TABLET | Freq: Three times a day (TID) | ORAL | Status: DC
  Administered 2021-11-27 – 2021-11-29 (×6): 5 mg via ORAL
  Filled 2021-11-27 (×6): qty 1

## 2021-11-27 MED ORDER — SODIUM CHLORIDE 0.9 % IV SOLN: 1.0000 g | INTRAVENOUS | Status: DC

## 2021-11-27 NOTE — ED Notes (Signed)
Pt given urinal.

## 2021-11-27 NOTE — Assessment & Plan Note (Addendum)
Likely secondary to demand ischemia ?No chest pain present ?Finished empiric lovenox x 48 hr ?Troponin peaked 4130 ?09/08/2021 LHC--moderate to severe diffuse disease--with subtotally occluded serial distal LAD  ?Seen by cardiology>>not a candidate for invasive intervention, continue plavix ?

## 2021-11-27 NOTE — Assessment & Plan Note (Addendum)
Secondary to fluid overload ?Improved with diuresis ?

## 2021-11-27 NOTE — Progress Notes (Signed)
*  PRELIMINARY RESULTS* ?Echocardiogram ?Limited 2D Echocardiogram has been performed. ? ?Jonathan Keith ?11/27/2021, 2:36 PM ?

## 2021-11-27 NOTE — Assessment & Plan Note (Addendum)
Secondary to CHF/pulmonary edema ?Also concerned about possible underlying pneumonia with leukocytosis and GGO on CTA chest ?Stable on 2 L nasal cannula>>RA ?Presented with oxygen saturation 92% on room air with tachypnea ?Wean oxygen for saturation greater 92% ?

## 2021-11-27 NOTE — ED Notes (Signed)
Pt in CT.

## 2021-11-27 NOTE — Assessment & Plan Note (Signed)
No chest pain presently ?09/08/21 LHC --Moderate to severe diffuse disease with subtotally occluded serial distal LAD lesions. ?Given the burden of disease and the patient's comorbidities medical management pursued ?-continue plavix ?

## 2021-11-27 NOTE — H&P (Signed)
?History and Physical  ? ? ?Patient: Jonathan Keith YQI:[id number] DOB: Mar 03, 1956 ?DOA: 11/26/2021 ?DOS: the patient was seen and examined on 11/27/2021 ?PCP: [name], PA-C  ?Patient coming from: Home ? ?Chief Complaint:  ?Chief Complaint  ?Patient presents with  ? Shortness of Breath  ? ?HPI: Jonathan Keith is a 66 year old male with a history of cholangiocarcinoma, coronary artery disease with NSTEMI Feb 3875, systolic CHF, COPD, tobacco abuse, diabetes mellitus type 2, hyperlipidemia presenting with shortness of breath and coughing of 1 day duration.  The patient denies any fevers, chills, headache, neck pain, chest pain, hemoptysis, nausea, vomiting, diarrhea, abdominal pain.  He has not had any hematochezia or melena.  He continues to smoke 1 pack/day since the age of 89.  He denies any recent weight gain, worsening lower extremity edema or increasing abdominal girth.  There is no orthopnea type symptoms. ?In the ED, the patient was afebrile with soft blood pressures.  Initial oxygen saturation was 82% on room air.  He was placed on 4 L with saturation up to 100%.  WBC 19.0, hemoglobin 9.0, platelets 167,000.  WBC 133, potassium 4.3, bicarb 21, serum creatinine 1.00.  Chest x-ray showed chronic interstitial markings.  CTA chest was negative for PE but showed diffuse interstitial and interlobular septal prominence insistent with edema.  There is also patchy bilateral GGO.  There is signs of cirrhosis with perihepatic ascites.  The patient was given furosemide 40 mg IV.  Cardiology was consulted to assist with management. ? ?Review of Systems: As mentioned in the history of present illness. All other systems reviewed and are negative. ?Past Medical History:  ?Diagnosis Date  ? Cancer [facility])   ? CHF (congestive heart failure) ([place])   ? Chronic pain   ? neck, back, knees  ? Class 1 obesity due to excess calories with body mass index (BMI) of 31.0 to 31.9 in adult   ? Claudication [facility])   ? Coronary artery  disease   ? DDD (degenerative disc disease), cervical   ? Diabetes mellitus ([place])   ? Dyslipidemia   ? Hypertension   ? Port-A-Cath in place 07/20/2021  ? Tobacco dependence   ? ?Past Surgical History:  ?Procedure Laterality Date  ? BILIARY BRUSHING N/A 02/25/2021  ? Procedure: BILIARY BRUSHING;  Surgeon: Rogene Houston, MD;  Location: [facility];  Service: Gastroenterology;  Laterality: N/A;  ? BILIARY STENT PLACEMENT N/A 02/25/2021  ? Procedure: BILIARY STENT PLACEMENT;  Surgeon: Rogene Houston, MD;  Location: [facility];  Service: Gastroenterology;  Laterality: N/A;  ? BILIARY STENT PLACEMENT  02/27/2021  ? Procedure: BILIARY STENT PLACEMENT (10FR x 9cm) IN THE RIGHT SYSTEM;  Surgeon: Rogene Houston, MD;  Location: [facility] OR[facility];  Service: Gastroenterology;;  ? BREAST SURGERY Left   ? benign lump- in his 74s.  ? CARDIAC CATHETERIZATION    ? ERCP N/A 02/25/2021  ? Procedure: ENDOSCOPIC RETROGRADE CHOLANGIOPANCREATOGRAPHY (ERCP);  Surgeon: Rogene Houston, MD;  Location: [facility];  Service: Gastroenterology;  Laterality: N/A;  ? ERCP N/A 02/27/2021  ? Procedure: ENDOSCOPIC RETROGRADE CHOLANGIOPANCREATOGRAPHY (ERCP);  Surgeon: Rogene Houston, MD;  Location: [facility];  Service: Gastroenterology;  Laterality: N/A;  ? IR IMAGING GUIDED PORT INSERTION  07/21/2021  ? KNEE SURGERY Left   ? x2  ? LEFT HEART CATH AND CORONARY ANGIOGRAPHY N/A 09/08/2021  ? Procedure: LEFT HEART CATH AND CORONARY ANGIOGRAPHY;  Surgeon: Early Osmond, MD;  Location: [place] CV [facility];  Service: Cardiovascular;  Laterality: N/A;  ? SPHINCTEROTOMY N/A 02/25/2021  ? Procedure: SPHINCTEROTOMY;  Surgeon: Rogene Houston, MD;  Location: [facility];  Service: Gastroenterology;  Laterality: N/A;  ? STENT REMOVAL  02/27/2021  ? Procedure: STENT REMOVAL (8.5Fr x 9cm);  Surgeon: Rogene Houston, MD;  Location: [facility];  Service: Gastroenterology;;  ? ?Social History:  reports that he has been smoking cigarettes. He has a 28.00 pack-year smoking history. He has  never used smokeless tobacco. He reports that he does not currently use alcohol. He reports that he does not use drugs. ? ?No Known Allergies ? ?Family History  ?Problem Relation Age of Onset  ? CAD Mother   ? Cancer Neg Hx   ? ? ?Prior to Admission medications   ?Medication Sig Start Date End Date Taking? Authorizing Provider  ?acetaminophen (TYLENOL) 500 MG tablet Take 500 mg by mouth every 6 (six) hours as needed for moderate pain or headache.   Yes [provider]  ?aspirin EC 81 MG tablet Take 81 mg by mouth daily. Swallow whole.   Yes [provider]  ?atorvastatin (LIPITOR) 40 MG tablet Take 1 tablet (40 mg total) by mouth daily at 6 PM. 09/12/21  Yes Duke, Tami Lin, [place]  ?CISPLATIN IV Inject into the vein once a week. Days 1, 8 every 21 days 07/22/21  Yes [provider]  ?clopidogrel (PLAVIX) 75 MG tablet Take 1 tablet (75 mg total) by mouth daily with breakfast. 09/13/21  Yes Duke, Tami Lin, PA  ?famotidine (PEPCID) 20 MG tablet Take 20 mg by mouth daily.   Yes [provider]  ?ferrous sulfate (FERROUSUL) 325 (65 FE) MG tablet Take 1 tablet (325 mg total) by mouth daily with breakfast. 03/18/21  Yes Rehman, Mechele Dawley, MD  ?furosemide (LASIX) 20 MG tablet Take 20 mg daily as needed for swelling 11/05/21  Yes Pemberton, Greer Ee, MD  ?Gemcitabine HCl (GEMZAR IV) Inject into the vein once a week. Days 1, 8 every 21 days 07/22/21  Yes [provider]  ?Ginseng 100 MG CAPS Take 1 capsule by mouth daily.   Yes [provider]  ?lidocaine-prilocaine (EMLA) cream Apply a small amount to port a cath site and cover with plastic wrap 1 hour prior to chemotherapy appointments 07/21/21  Yes Derek Jack, MD  ?loratadine (CLARITIN) 10 MG tablet Take 10 mg by mouth daily. Takes for a few days after shot to boost WBC. About every 2nd treatment   Yes [provider]  ?metFORMIN (GLUCOPHAGE) 500 MG tablet Take 1 tablet (500 mg total) by mouth 2 (two)  times daily with a meal. 05/29/21 11/26/21 Yes Dessa Phi, DO  ?midodrine (PROAMATINE) 5 MG tablet Take 1 tablet (5 mg total) by mouth 3 (three) times daily with meals. 09/12/21  Yes Duke, Tami Lin, PA  ?nitroGLYCERIN (NITROSTAT) 0.4 MG SL tablet Place 1 tablet (0.4 mg total) under the tongue every 5 (five) minutes as needed for chest pain. 09/12/21 09/12/22 Yes Duke, Tami Lin, PA  ?pantoprazole (PROTONIX) 40 MG tablet Take 1 tablet (40 mg total) by mouth daily. 09/12/21  Yes Duke, Tami Lin, PA  ?prochlorperazine (COMPAZINE) 10 MG tablet Take 1 tablet (10 mg total) by mouth every 6 (six) hours as needed (Nausea or vomiting). 07/21/21  Yes Derek Jack, MD  ?tamsulosin (FLOMAX) 0.4 MG CAPS capsule TAKE (2) CAPSULES BY MOUTH AT BEDTIME. ?Patient taking differently: 0.4 mg at bedtime. If needed can take additional tablet 11/25/21  Yes Derek Jack, MD  ?[place] (  IMFINZI IV) Inject into the vein every 21 ( twenty-one) days. 07/22/21   [provider]  ?megestrol (MEGACE) 400 MG/10ML suspension Take 10 mLs (400 mg total) by mouth 2 (two) times daily. ?Patient not taking: Reported on 11/26/2021 07/17/21   Derek Jack, MD  ? ? ?Physical Exam: ?Vitals:  ? 11/27/21 0430 11/27/21 0600 11/27/21 0630 11/27/21 0730  ?BP: 100/77 101/70 97/64 104/72  ?Pulse: (!) 105 95 95 100  ?Resp: '20 13 13 20  '$ ?Temp:      ?SpO2: 100% 100% 100% 96%  ?Weight:      ?Height:      ? ?GENERAL:  A&O x 3, NAD, well developed, cooperative, follows commands ?HEENT: [place]/AT, No thrush, No icterus, No oral ulcers ?Neck:  No neck mass, No meningismus, soft, supple ?CV: RRR, no S3, no S4, no rub, no JVD ?Lungs: Diminished breath sounds.  Bibasilar crackles. ?Abd: soft/NT +BS, nondistended ?Ext: trace LE edema, no lymphangitis, no cyanosis, no rashes ?Neuro:  CN II-XII intact, strength 4/5 in RUE, RLE, strength 4/5 LUE, LLE; sensation intact bilateral; no dysmetria; babinski equivocal ? ?Data Reviewed: ?{Data reviewed  and the history ? ?Assessment and Plan: ?* Acute respiratory failure with hypoxia (HCC) ?Secondary to CHF/pulmonary edema ?Also concerned about possible underlying pneumonia with leukocytosis and GGO on

## 2021-11-27 NOTE — Progress Notes (Signed)
ANTICOAGULATION CONSULT NOTE - Initial Consult ? ?Pharmacy Consult for Heparin ?Indication: chest pain/ACS ? ?No Known Allergies ? ?Patient Measurements: ?Height: '5\' 9"'$  (175.3 cm) ?Weight: 70 kg (154 lb 5.2 oz) ?IBW/kg (Calculated) : 70.7 ? ?Vital Signs: ?Temp: 97.7 ?F (36.5 ?C) (04/19 2051) ?BP: 106/69 (04/20 0000) ?Pulse Rate: 112 (04/20 0000) ? ?Labs: ?Recent Labs  ?  11/25/21 ?0867 11/26/21 ?2132 11/26/21 ?2309  ?HGB 9.1* 9.0*  --   ?HCT 28.5* 26.9*  --   ?PLT 146* 167  --   ?CREATININE 0.97 1.00  --   ?TROPONINIHS  --  60* 232*  ? ? ?Estimated Creatinine Clearance: 72.9 mL/min (by C-G formula based on SCr of 1 mg/dL). ? ? ?Medical History: ?Past Medical History:  ?Diagnosis Date  ? Cancer [facility])   ? CHF (congestive heart failure) ([place])   ? Chronic pain   ? neck, back, knees  ? Class 1 obesity due to excess calories with body mass index (BMI) of 31.0 to 31.9 in adult   ? Claudication [facility])   ? Coronary artery disease   ? DDD (degenerative disc disease), cervical   ? Diabetes mellitus ([place])   ? Dyslipidemia   ? Hypertension   ? Port-A-Cath in place 07/20/2021  ? Tobacco dependence   ? ? ?Medications:  ?No current facility-administered medications on file prior to encounter.  ? ?Current Outpatient Medications on File Prior to Encounter  ?Medication Sig Dispense Refill  ? acetaminophen (TYLENOL) 500 MG tablet Take 500 mg by mouth every 6 (six) hours as needed for moderate pain or headache.    ? aspirin EC 81 MG tablet Take 81 mg by mouth daily. Swallow whole.    ? atorvastatin (LIPITOR) 40 MG tablet Take 1 tablet (40 mg total) by mouth daily at 6 PM. 90 tablet 3  ? CISPLATIN IV Inject into the vein once a week. Days 1, 8 every 21 days    ? clopidogrel (PLAVIX) 75 MG tablet Take 1 tablet (75 mg total) by mouth daily with breakfast. 90 tablet 3  ? famotidine (PEPCID) 20 MG tablet Take 20 mg by mouth daily.    ? ferrous sulfate (FERROUSUL) 325 (65 FE) MG tablet Take 1 tablet (325 mg total) by mouth daily with  breakfast.    ? furosemide (LASIX) 20 MG tablet Take 20 mg daily as needed for swelling 90 tablet 1  ? Gemcitabine HCl (GEMZAR IV) Inject into the vein once a week. Days 1, 8 every 21 days    ? Ginseng 100 MG CAPS Take 1 capsule by mouth daily.    ? lidocaine-prilocaine (EMLA) cream Apply a small amount to port a cath site and cover with plastic wrap 1 hour prior to chemotherapy appointments 30 g 3  ? loratadine (CLARITIN) 10 MG tablet Take 10 mg by mouth daily. Takes for a few days after shot to boost WBC. About every 2nd treatment    ? metFORMIN (GLUCOPHAGE) 500 MG tablet Take 1 tablet (500 mg total) by mouth 2 (two) times daily with a meal. 60 tablet 0  ? midodrine (PROAMATINE) 5 MG tablet Take 1 tablet (5 mg total) by mouth 3 (three) times daily with meals. 270 tablet 3  ? nitroGLYCERIN (NITROSTAT) 0.4 MG SL tablet Place 1 tablet (0.4 mg total) under the tongue every 5 (five) minutes as needed for chest pain. 25 tablet 3  ? pantoprazole (PROTONIX) 40 MG tablet Take 1 tablet (40 mg total) by mouth daily. 90 tablet 3  ?  prochlorperazine (COMPAZINE) 10 MG tablet Take 1 tablet (10 mg total) by mouth every 6 (six) hours as needed (Nausea or vomiting). 60 tablet 3  ? tamsulosin (FLOMAX) 0.4 MG CAPS capsule TAKE (2) CAPSULES BY MOUTH AT BEDTIME. (Patient taking differently: 0.4 mg at bedtime. If needed can take additional tablet) 60 capsule 2  ? Durvalumab (IMFINZI IV) Inject into the vein every 21 ( twenty-one) days.    ? megestrol (MEGACE) 400 MG/10ML suspension Take 10 mLs (400 mg total) by mouth 2 (two) times daily. (Patient not taking: Reported on 11/26/2021) 480 mL 0  ?  ? ?Assessment: ?66 y.o. male with elevated troponin, possible ACS, for heparin ? ?Goal of Therapy:  ?Heparin level 0.3-0.7 units/ml ?Monitor platelets by anticoagulation protocol: Yes ?  ?Plan:  ?Heparin 4000 units IV bolus, then start heparin 850 units/hr ?Check heparin level in 8 hours.  ? ?Jonathan Keith, Jonathan Keith ?11/27/2021,12:11 AM ? ? ?

## 2021-11-27 NOTE — Assessment & Plan Note (Addendum)
Tobacco cessation discussed ?Over 50-pack-year history ?Prn albuterol prescribed at time of d/c ?

## 2021-11-27 NOTE — Assessment & Plan Note (Signed)
Continue statin. 

## 2021-11-27 NOTE — ED Notes (Addendum)
Date and time results received: 11/27/21 12:04 AM   ?(use smartphrase ".now" to insert current time) ? ?Test: troponin   ?Critical Value: 232, also informed me at the same time that the first trop was 60 ? ?Name of Provider Notified: Dr Rogene Houston ? ?Orders Received? Or Actions Taken?: see chart ?

## 2021-11-27 NOTE — Hospital Course (Signed)
66 year old male with a history of cholangiocarcinoma, coronary artery disease with NSTEMI Feb 7989, systolic CHF, COPD, tobacco abuse, diabetes mellitus type 2, hyperlipidemia presenting with shortness of breath and coughing of 1 day duration.  The patient denies any fevers, chills, headache, neck pain, chest pain, hemoptysis, nausea, vomiting, diarrhea, abdominal pain.  He has not had any hematochezia or melena.  He continues to smoke 1 pack/day since the age of 51.  He denies any recent weight gain, worsening lower extremity edema or increasing abdominal girth.  There is no orthopnea type symptoms. ?In the ED, the patient was afebrile with soft blood pressures.  Initial oxygen saturation was 82% on room air.  He was placed on 4 L with saturation up to 100%.  WBC 19.0, hemoglobin 9.0, platelets 167,000.  WBC 133, potassium 4.3, bicarb 21, serum creatinine 1.00.  Chest x-ray showed chronic interstitial markings.  CTA chest was negative for PE but showed diffuse interstitial and interlobular septal prominence insistent with edema.  There is also patchy bilateral GGO.  There is signs of cirrhosis with perihepatic ascites.  The patient was given furosemide 40 mg IV.  Cardiology was consulted to assist with management. ?

## 2021-11-27 NOTE — Consult Note (Addendum)
?Cardiology Consultation:  ? ?Patient ID: Jonathan Keith ?MRN: [id number]; DOB: 1955/08/27 ? ?Admit date: 11/26/2021 ?Date of Consult: 11/27/2021 ? ?PCP:  [name], PA-C ?  ?[facility] Providers ?Cardiologist:  Evalina Field, MD      ? ? ?Patient Profile:  ? ?Jonathan Keith is a 66 y.o. male with a hx of CAD (s/p NSTEMI in 08/2021 with cath showing moderate to severe diffuse disease with subtotally occluded distal LAD and medical management was recommended given his comorbidities), intrahepatic metastatic cholangiocarcinoma (s/p biliary stenting and right portal vein embolization - currently receiving chemotherapy), HTN, HLD, Type II DM and PAD who is being seen 11/27/2021 for the evaluation of elevated troponin values at the request of Dr. Josephine Cables. ? ?History of Present Illness:  ? ?Mr. Larch was admitted to [facility] in 08/2021 for an NSTEMI with [facility] [place] [address]. He underwent cardiac catheterization which showed severe three-vessel CAD with subtotally occluded distal LAD and anatomy was not suitable for PCI and not felt to be a CABG candidate due to his poor prognosis in the setting of cholangiocarcinoma. He did have episodes of atrial fibrillation/flutter during admission and converted back to normal sinus rhythm. He was not felt to be a candidate for anticoagulation given his severe anemia as he also required transfusion during admission. Was discharged on ASA 81 mg daily, Atorvastatin 40 mg daily, Plavix 75 mg daily and Midodrine 5 mg 3 times daily. He did follow-up with Dr. Audie Box in 10/2021 and denied any recent anginal symptoms. He was continued only on Plavix 75 mg daily as it was felt to be high risk for bleeding with DAPT. BP did not allow for initiation of GDMT and he was continued on Midodrine. ? ?He presented to [facility] ED on 11/26/2021 for evaluation of worsening dyspnea and coughing. In talking with the patient and his wife today, he reports he had overall been  doing well until yesterday when he was mowing the yard. He was outside on his riding mower for over 2 and half hours and developed worsening coughing and dyspnea afterwards.  Reports that his breathing had been stable prior to this. No specific orthopnea, PND or pitting edema. They have not had to utilize Lasix recently. They do report his weight has been variable as his PO intake varies from day-to-day given his current chemotherapy treatments. They do report that he receives a significant amount of fluid on the days that he has chemotherapy. He denies any recent chest pain or palpitations. ? ?Initial labs show WBC 19.0, Hgb 9.0, platelets 167, +133, K+ 4.3 and creatinine 1.00. BNP 518. Initial Hs Troponin 60 with repeat of 232. Negative for COVID and Influenza. CXR showing chronic appearing increased lung markings with mild to moderate bibasilar atelectasis.  CTA showed no evidence of PE but was noted to have aneurysmal dilatation of the base of the left ventricle along with suggestion of CHF and a small right pleural effusion and patchy areas of groundglass opacity which may represent edema or pneumonia. EKG showing sinus tachycardia, heart rate 138 with ST depression along the lateral leads. ? ?He initially received IV fluids and these have been discontinued and he received IV Lasix 40 mg once his CT resulted.  He has been started on IV Heparin and also received a DuoNeb and IV steroids. ? ? ?Past Medical History:  ?Diagnosis Date  ? Cancer [facility])   ? CHF (congestive heart failure) ([place])   ? Chronic pain   ?  neck, back, knees  ? Class 1 obesity due to excess calories with body mass index (BMI) of 31.0 to 31.9 in adult   ? Claudication [facility])   ? Coronary artery disease   ? DDD (degenerative disc disease), cervical   ? Diabetes mellitus ([place])   ? Dyslipidemia   ? Hypertension   ? Port-A-Cath in place 07/20/2021  ? Tobacco dependence   ? ? ?Past Surgical History:  ?Procedure Laterality Date  ? BILIARY BRUSHING N/A  02/25/2021  ? Procedure: BILIARY BRUSHING;  Surgeon: Rogene Houston, MD;  Location: [facility];  Service: Gastroenterology;  Laterality: N/A;  ? BILIARY STENT PLACEMENT N/A 02/25/2021  ? Procedure: BILIARY STENT PLACEMENT;  Surgeon: Rogene Houston, MD;  Location: [facility];  Service: Gastroenterology;  Laterality: N/A;  ? BILIARY STENT PLACEMENT  02/27/2021  ? Procedure: BILIARY STENT PLACEMENT (10FR x 9cm) IN THE RIGHT SYSTEM;  Surgeon: Rogene Houston, MD;  Location: [facility];  Service: Gastroenterology;;  ? BREAST SURGERY Left   ? benign lump- in his 11s.  ? CARDIAC CATHETERIZATION    ? ERCP N/A 02/25/2021  ? Procedure: ENDOSCOPIC RETROGRADE CHOLANGIOPANCREATOGRAPHY (ERCP);  Surgeon: Rogene Houston, MD;  Location: [facility];  Service: Gastroenterology;  Laterality: N/A;  ? ERCP N/A 02/27/2021  ? Procedure: ENDOSCOPIC RETROGRADE CHOLANGIOPANCREATOGRAPHY (ERCP);  Surgeon: Rogene Houston, MD;  Location: [facility];  Service: Gastroenterology;  Laterality: N/A;  ? IR IMAGING GUIDED PORT INSERTION  07/21/2021  ? KNEE SURGERY Left   ? x2  ? LEFT HEART CATH AND CORONARY ANGIOGRAPHY N/A 09/08/2021  ? Procedure: LEFT HEART CATH AND CORONARY ANGIOGRAPHY;  Surgeon: Early Osmond, MD;  Location: [place] CV LAB;  Service: Cardiovascular;  Laterality: N/A;  ? SPHINCTEROTOMY N/A 02/25/2021  ? Procedure: SPHINCTEROTOMY;  Surgeon: Rogene Houston, MD;  Location: [facility];  Service: Gastroenterology;  Laterality: N/A;  ? STENT REMOVAL  02/27/2021  ? Procedure: STENT REMOVAL (8.5Fr x 9cm);  Surgeon: Rogene Houston, MD;  Location: [facility];  Service: Gastroenterology;;  ?  ? ?Home Medications:  ?Prior to Admission medications   ?Medication Sig Start Date End Date Taking? Authorizing Provider  ?acetaminophen (TYLENOL) 500 MG tablet Take 500 mg by mouth every 6 (six) hours as needed for moderate pain or headache.   Yes [provider]  ?aspirin EC 81 MG tablet Take 81 mg by mouth daily. Swallow whole.   Yes [provider]  ?atorvastatin (LIPITOR) 40 MG tablet Take 1 tablet (40 mg total) by mouth daily at 6 PM. 09/12/21  Yes Duke, Tami Lin, [place]  ?CISPLATIN IV Inject into the vein once a week. Days 1, 8 every 21 days 07/22/21  Yes [provider]  ?clopidogrel (PLAVIX) 75 MG tablet Take 1 tablet (75 mg total) by mouth daily with breakfast. 09/13/21  Yes Duke, Tami Lin, PA  ?famotidine (PEPCID) 20 MG tablet Take 20 mg by mouth daily.   Yes [provider]  ?ferrous sulfate (FERROUSUL) 325 (65 FE) MG tablet Take 1 tablet (325 mg total) by mouth daily with breakfast. 03/18/21  Yes Rehman, Mechele Dawley, MD  ?furosemide (LASIX) 20 MG tablet Take 20 mg daily as needed for swelling 11/05/21  Yes Pemberton, Greer Ee, MD  ?Gemcitabine HCl (GEMZAR IV) Inject into the vein once a week. Days 1, 8 every 21 days 07/22/21  Yes [provider]  ?Ginseng 100 MG CAPS Take 1 capsule by mouth daily.   Yes [provider]  ?  lidocaine-prilocaine (EMLA) cream Apply a small amount to port a cath site and cover with plastic wrap 1 hour prior to chemotherapy appointments 07/21/21  Yes Derek Jack, MD  ?loratadine (CLARITIN) 10 MG tablet Take 10 mg by mouth daily. Takes for a few days after shot to boost WBC. About every 2nd treatment   Yes [provider]  ?metFORMIN (GLUCOPHAGE) 500 MG tablet Take 1 tablet (500 mg total) by mouth 2 (two) times daily with a meal. 05/29/21 11/26/21 Yes Dessa Phi, DO  ?midodrine (PROAMATINE) 5 MG tablet Take 1 tablet (5 mg total) by mouth 3 (three) times daily with meals. 09/12/21  Yes Duke, Tami Lin, PA  ?nitroGLYCERIN (NITROSTAT) 0.4 MG SL tablet Place 1 tablet (0.4 mg total) under the tongue every 5 (five) minutes as needed for chest pain. 09/12/21 09/12/22 Yes Duke, Tami Lin, PA  ?pantoprazole (PROTONIX) 40 MG tablet Take 1 tablet (40 mg total) by mouth daily. 09/12/21  Yes Duke, Tami Lin, PA  ?prochlorperazine (COMPAZINE) 10 MG tablet Take 1  tablet (10 mg total) by mouth every 6 (six) hours as needed (Nausea or vomiting). 07/21/21  Yes Derek Jack, MD  ?tamsulosin (FLOMAX) 0.4 MG CAPS capsule TAKE (2) CAPSULES BY MOUTH AT BEDTIME. ?Patient taking

## 2021-11-27 NOTE — Assessment & Plan Note (Addendum)
Last chemotherapy 11/25/2021 with cisplatin and gemcitabine-cycle #7 ?Follow-up Dr. Delton Coombes ?

## 2021-11-27 NOTE — Assessment & Plan Note (Addendum)
Continue IV furosemide>>neg 2.9L ?Cardiology consult appreciated ?Daily weights--D/C weight 148 lbs ?Accurate I's and O's ?11/27/21 echo-EF 40-45% +WMA, mild MR ?05/28/2021 echo EF 55 to 60%, no WMA, trivial MR ?09/08/21 LHC EF 45-50% ?Pt given instructions to weigh himself daily and take prn lasix if gains >2 lbs  ?

## 2021-11-27 NOTE — Assessment & Plan Note (Addendum)
Holding metformin>>restart after d/c ?09/08/2021 hemoglobin A1c 5.8 ?Allow for liberal glycemic control at this point ?

## 2021-11-27 NOTE — TOC Progression Note (Addendum)
?  Transition of Care (TOC) Screening Note ? ? ?Patient Details  ?Name: Jonathan Keith ?Date of Birth: 06-10-1956 ? ? ?Transition of Care (TOC) CM/SW Contact:    ?Boneta Lucks, RN ?Phone Number: ?11/27/2021, 3:27 PM ? ?Patient still in ED waiting on a bed. TOC will follow to assess. ? ?4/21 multiple attempts to call patient and wife. No return calls. DC possibly on Sunday. Cardiology following, No needs per MD.  ? ?Transition of Care Department [facility]) has reviewed patient and no TOC needs have been identified at this time. We will continue to monitor patient advancement through interdisciplinary progression rounds. If new patient transition needs arise, please place a TOC consult. ? ?  ?Barriers to Discharge: Continued Medical Work up ? ?

## 2021-11-27 NOTE — Progress Notes (Addendum)
ANTICOAGULATION CONSULT NOTE - Initial Consult ? ?Pharmacy Consult for Heparin>>Lovenox ?Indication: chest pain/ACS ? ?No Known Allergies ? ?Patient Measurements: ?Height: '5\' 9"'$  (175.3 cm) ?Weight: 70 kg (154 lb 5.2 oz) ?IBW/kg (Calculated) : 70.7 ? ?Vital Signs: ?BP: 103/71 (04/20 1030) ?Pulse Rate: 95 (04/20 1030) ? ?Labs: ?Recent Labs  ?  11/25/21 ?0749 11/26/21 ?2132 11/26/21 ?2309 11/27/21 ?0750  ?HGB 9.1* 9.0*  --   --   ?HCT 28.5* 26.9*  --   --   ?PLT 146* 167  --   --   ?HEPARINUNFRC  --   --   --  0.23*  ?CREATININE 0.97 1.00  --   --   ?TROPONINIHS  --  60* 232*  --   ? ? ? ?Estimated Creatinine Clearance: 72.9 mL/min (by C-G formula based on SCr of 1 mg/dL). ? ? ?Medical History: ?Past Medical History:  ?Diagnosis Date  ? Cancer [facility])   ? CHF (congestive heart failure) ([place])   ? Chronic pain   ? neck, back, knees  ? Class 1 obesity due to excess calories with body mass index (BMI) of 31.0 to 31.9 in adult   ? [facility])   ? Coronary artery disease   ? DDD (degenerative disc disease), cervical   ? Diabetes mellitus ([place])   ? Dyslipidemia   ? Hypertension   ? Port-A-Cath in place 07/20/2021  ? Tobacco dependence   ? ? ?Medications:  ?No current facility-administered medications on file prior to encounter.  ? ?Current Outpatient Medications on File Prior to Encounter  ?Medication Sig Dispense Refill  ? acetaminophen (TYLENOL) 500 MG tablet Take 500 mg by mouth every 6 (six) hours as needed for moderate pain or headache.    ? aspirin EC 81 MG tablet Take 81 mg by mouth daily. Swallow whole.    ? atorvastatin (LIPITOR) 40 MG tablet Take 1 tablet (40 mg total) by mouth daily at 6 PM. 90 tablet 3  ? CISPLATIN IV Inject into the vein once a week. Days 1, 8 every 21 days    ? clopidogrel (PLAVIX) 75 MG tablet Take 1 tablet (75 mg total) by mouth daily with breakfast. 90 tablet 3  ? famotidine (PEPCID) 20 MG tablet Take 20 mg by mouth daily.    ? ferrous sulfate (FERROUSUL) 325 (65 FE) MG tablet Take 1  tablet (325 mg total) by mouth daily with breakfast.    ? furosemide (LASIX) 20 MG tablet Take 20 mg daily as needed for swelling 90 tablet 1  ? Gemcitabine HCl (GEMZAR IV) Inject into the vein once a week. Days 1, 8 every 21 days    ? Ginseng 100 MG CAPS Take 1 capsule by mouth daily.    ? lidocaine-prilocaine (EMLA) cream Apply a small amount to port a cath site and cover with plastic wrap 1 hour prior to chemotherapy appointments 30 g 3  ? loratadine (CLARITIN) 10 MG tablet Take 10 mg by mouth daily. Takes for a few days after shot to boost WBC. About every 2nd treatment    ? metFORMIN (GLUCOPHAGE) 500 MG tablet Take 1 tablet (500 mg total) by mouth 2 (two) times daily with a meal. 60 tablet 0  ? midodrine (PROAMATINE) 5 MG tablet Take 1 tablet (5 mg total) by mouth 3 (three) times daily with meals. 270 tablet 3  ? nitroGLYCERIN (NITROSTAT) 0.4 MG SL tablet Place 1 tablet (0.4 mg total) under the tongue every 5 (five) minutes as needed for chest pain. 25  tablet 3  ? pantoprazole (PROTONIX) 40 MG tablet Take 1 tablet (40 mg total) by mouth daily. 90 tablet 3  ? prochlorperazine (COMPAZINE) 10 MG tablet Take 1 tablet (10 mg total) by mouth every 6 (six) hours as needed (Nausea or vomiting). 60 tablet 3  ? tamsulosin (FLOMAX) 0.4 MG CAPS capsule TAKE (2) CAPSULES BY MOUTH AT BEDTIME. (Patient taking differently: 0.4 mg at bedtime. If needed can take additional tablet) 60 capsule 2  ? Durvalumab (IMFINZI IV) Inject into the vein every 21 ( twenty-one) days.    ? megestrol (MEGACE) 400 MG/10ML suspension Take 10 mLs (400 mg total) by mouth 2 (two) times daily. (Patient not taking: Reported on 11/26/2021) 480 mL 0  ?  ? ?Assessment: ?66 y.o. male with elevated troponin, possible ACS, for heparin. ?CTA negative for PE, suggested CHF. INtrahepatic metastatic cholangiocarcinoma. Issues with anemia recently, stable. ? ?HL 0.23, below goal, no issues with infusion ? ? ?Addendum ? ?Since no intervention anticipated, ok to  change heparin to Lovenox per Dr. Harl Bowie.  ? ?Goal of Therapy:  ?Anti-Xa 0.6-1 ?Monitor platelets by anticoagulation protocol: Yes ?  ?Plan:  ?Dc heparin ?Lovenox '70mg'$  SQ BID ?F/u dc lovenox ? ?Onnie Boer, PharmD, BCIDP, AAHIVP, CPP ?Infectious Disease Pharmacist ?11/27/2021 1:51 PM ? ? ? ? ?

## 2021-11-27 NOTE — Assessment & Plan Note (Signed)
Has been chronic secondary to cholangiocarcinoma ?-Monitor clinically ?

## 2021-11-27 NOTE — ED Provider Notes (Signed)
Patient signed out to me by Dr. Rogene Houston.  CT angio pending.  Patient seen with shortness of breath, hypoxia.  Patient has a history of severe CAD.  He has not been experiencing any chest pain with his shortness of breath.  He does have a history of cancer, currently being treated for cholangiocarcinoma.  PE was therefore considered.  CT scan, however, does not show evidence of PE, lung findings are more consistent with congestive heart failure. ? ?Discussed with Dr. Alfred Levins, on-call for cardiology.  Patient significant multivessel CAD with significant comorbidities preventing intervention have been reviewed.  Agrees with medical management at this time.  Patient does not require transfer to [facility] at this time.  Get [facility].  Patient had initially been started on heparin for presumed NSTEMI versus PE.  Can continue this.  No active chest pain.  Initiate diuresis. ?  ?Orpah Greek, MD ?11/27/21 [phone] ? ?

## 2021-11-28 DIAGNOSIS — R7989 Other specified abnormal findings of blood chemistry: Secondary | ICD-10-CM | POA: Diagnosis not present

## 2021-11-28 DIAGNOSIS — C221 Intrahepatic bile duct carcinoma: Secondary | ICD-10-CM | POA: Diagnosis not present

## 2021-11-28 DIAGNOSIS — J9601 Acute respiratory failure with hypoxia: Secondary | ICD-10-CM | POA: Diagnosis not present

## 2021-11-28 DIAGNOSIS — E871 Hypo-osmolality and hyponatremia: Secondary | ICD-10-CM

## 2021-11-28 DIAGNOSIS — I5023 Acute on chronic systolic (congestive) heart failure: Secondary | ICD-10-CM | POA: Diagnosis not present

## 2021-11-28 DIAGNOSIS — J181 Lobar pneumonia, unspecified organism: Secondary | ICD-10-CM

## 2021-11-28 LAB — BASIC METABOLIC PANEL
Anion gap: 7 (ref 5–15)
BUN: 31 mg/dL — ABNORMAL HIGH (ref 8–23)
CO2: 28 mmol/L (ref 22–32)
Calcium: 8.5 mg/dL — ABNORMAL LOW (ref 8.9–10.3)
Chloride: 102 mmol/L (ref 98–111)
Creatinine, Ser: 1.03 mg/dL (ref 0.61–1.24)
GFR, Estimated: >60 mL/min (ref >60)
Glucose, Bld: 86 mg/dL (ref 70–99)
Potassium: 4.1 mmol/L (ref 3.5–5.1)
Sodium: 137 mmol/L (ref 135–145)

## 2021-11-28 LAB — CBC
HCT: 24.8 % — ABNORMAL LOW (ref 39.0–52.0)
Hemoglobin: 7.9 g/dL — ABNORMAL LOW (ref 13.0–17.0)
MCH: 33.8 pg (ref 26.0–34.0)
MCHC: 31.9 g/dL (ref 30.0–36.0)
MCV: 106 fL — ABNORMAL HIGH (ref 80.0–100.0)
Platelets: 147 10*3/uL — ABNORMAL LOW (ref 150–400)
RBC: 2.34 MIL/uL — ABNORMAL LOW (ref 4.22–5.81)
RDW: 20.4 % — ABNORMAL HIGH (ref 11.5–15.5)
WBC: 7.6 10*3/uL (ref 4.0–10.5)
nRBC: 0 % (ref 0.0–0.2)

## 2021-11-28 LAB — TROPONIN I (HIGH SENSITIVITY)
Troponin I (High Sensitivity): 4130 ng/L (ref <18)
Troponin I (High Sensitivity): 3585 ng/L (ref <18)

## 2021-11-28 LAB — MAGNESIUM: Magnesium: 2.1 mg/dL (ref 1.7–2.4)

## 2021-11-28 LAB — GLUCOSE, CAPILLARY
Glucose-Capillary: 92 mg/dL (ref 70–99)
Glucose-Capillary: 136 mg/dL — ABNORMAL HIGH (ref 70–99)
Glucose-Capillary: 152 mg/dL — ABNORMAL HIGH (ref 70–99)

## 2021-11-28 NOTE — Progress Notes (Signed)
? ?Progress Note ? ?Patient Name: Jonathan Keith ?Date of Encounter: 11/28/2021 ? ?[facility] Cardiologist: Evalina Field, MD  ? ?Subjective  ? ?"I feel good   No CP   No SOB" ? ?Inpatient Medications  ?  ?Scheduled Meds: ? atorvastatin  40 mg Oral q1800  ? azithromycin  500 mg Oral Daily  ? clopidogrel  75 mg Oral Q breakfast  ? enoxaparin (LOVENOX) injection  70 mg Subcutaneous BID  ? ferrous sulfate  325 mg Oral Q breakfast  ? furosemide  20 mg Intravenous Daily  ? loratadine  10 mg Oral Daily  ? midodrine  5 mg Oral TID WC  ? pantoprazole  40 mg Oral Daily  ? sodium chloride flush  3 mL Intravenous Q12H  ? tamsulosin  0.8 mg Oral QHS  ? ?Continuous Infusions: ? sodium chloride 250 mL (11/28/21 0748)  ? cefTRIAXone (ROCEPHIN)  IV 1 g (11/28/21 0751)  ? ?PRN Meds: ?sodium chloride, acetaminophen, ondansetron (ZOFRAN) IV, sodium chloride flush  ? ?Vital Signs  ?  ?Vitals:  ? 11/28/21 0127 11/28/21 0545 11/28/21 3244 11/28/21 0948  ?BP: 92/60 98/66  99/62  ?Pulse: 94 89  94  ?Resp: 18 20    ?Temp: (!) 97.1 ?F (36.2 ?C) 98 ?F (36.7 ?C)  98.5 ?F (36.9 ?C)  ?TempSrc:    Oral  ?SpO2: 92% 92%  97%  ?Weight:   73.9 kg   ?Height:      ? ? ?Intake/Output Summary (Last 24 hours) at 11/28/2021 1158 ?Last data filed at 11/28/2021 0102 ?Gross per 24 hour  ?Intake 197.7 ml  ?Output 500 ml  ?Net -302.3 ml  ? ? ?  11/28/2021  ?  6:11 AM 11/26/2021  ?  8:45 PM 11/25/2021  ?  7:56 AM  ?Last 3 Weights  ?Weight (lbs) 163 lb 154 lb 5.2 oz 155 lb 3.2 oz  ?Weight (kg) 73.936 kg 70 kg 70.398 kg  ?   ? ?Telemetry  ?  ?SR    - Personally Reviewed ? ?ECG  ?  ?No new  - Personally Reviewed ? ?Physical Exam  ? ?GEN: No acute distress.   ?Neck: No JVD ?Cardiac: RRR, no murmurs ?Respiratory: Clear to auscultation bilaterally. ?GI: Soft, nontender, non-distended  ?MS: No edema; No deformity. ?Neuro:  Nonfocal  ?Psych: Normal affect  ? ?Labs  ?  ?High Sensitivity Troponin:   ?Recent Labs  ?Lab 11/26/21 ?2132 11/26/21 ?2309 11/28/21 ?0433   ?TROPONINIHS 60* 232* 4,130*  ?   ?Chemistry ?Recent Labs  ?Lab 11/25/21 ?0749 11/26/21 ?2132 11/28/21 ?0433  ?NA 134* 133* 137  ?K 4.4 4.3 4.1  ?CL 103 104 102  ?CO2 24 21* 28  ?GLUCOSE 184* 145* 86  ?BUN 16 22 31*  ?CREATININE 0.97 1.00 1.03  ?CALCIUM 8.7* 8.7* 8.5*  ?MG 1.6*  --  2.1  ?PROT 6.4* 6.6  --   ?ALBUMIN 2.9* 3.1*  --   ?AST 49* 76*  --   ?ALT 42 55*  --   ?ALKPHOS 412* 360*  --   ?BILITOT 1.1 1.3*  --   ?GFRNONAA >60 >60 >60  ?ANIONGAP '7 8 7  '$ ?  ?Lipids No results for input(s): CHOL, TRIG, HDL, LABVLDL, LDLCALC, CHOLHDL in the last 168 hours.  ?Hematology ?Recent Labs  ?Lab 11/25/21 ?0749 11/26/21 ?2132 11/28/21 ?0433  ?WBC 8.9 19.0* 7.6  ?RBC 2.71* 2.55* 2.34*  ?HGB 9.1* 9.0* 7.9*  ?HCT 28.5* 26.9* 24.8*  ?MCV 105.2* 105.5* 106.0*  ?MCH 33.6  35.3* 33.8  ?MCHC 31.9 33.5 31.9  ?RDW 20.5* 20.0* 20.4*  ?PLT 146* 167 147*  ? ?Thyroid No results for input(s): TSH, FREET4 in the last 168 hours.  ?BNP ?Recent Labs  ?Lab 11/26/21 ?2132  ?BNP 518.0*  ?  ?DDimer No results for input(s): DDIMER in the last 168 hours.  ? ?Radiology  ?  ?CT Angio Chest PE W/Cm &/Or Wo Cm ? ?Result Date: 11/27/2021 ?CLINICAL DATA:  Concern for pulmonary embolism. EXAM: CT ANGIOGRAPHY CHEST WITH CONTRAST TECHNIQUE: Multidetector CT imaging of the chest was performed using the standard protocol during bolus administration of intravenous contrast. Multiplanar CT image reconstructions and MIPs were obtained to evaluate the vascular anatomy. RADIATION DOSE REDUCTION: This exam was performed according to the departmental dose-optimization program which includes automated exposure control, adjustment of the mA and/or kV according to patient size and/or use of iterative reconstruction technique. CONTRAST:  121m OMNIPAQUE IOHEXOL 350 MG/ML SOLN COMPARISON:  Chest CT dated 10/09/2021. FINDINGS: Cardiovascular: No cardiomegaly. 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 Further evaluation with echocardiogram if not  previously performed is advised. No pericardial effusion. Three-vessel coronary vascular calcification. There is moderate calcified and noncalcified plaque of the thoracic aorta. No aneurysmal dilatation. No pulmonary artery embolus identified. Mediastinum/Nodes: No hilar or mediastinal adenopathy. The esophagus is grossly unremarkable. No mediastinal fluid collection. Lungs/Pleura: There is background of centrilobular emphysema. There is diffuse interstitial and interlobular septal prominence consistent with edema. Scattered clusters of ground-glass opacity throughout the lungs may represent edema or developing infiltrate. There is a small right pleural effusion. The bibasilar subsegmental consolidation may represent atelectasis or infiltrate. There is no pneumothorax. The central airways are patent. There is peribronchial thickening. Upper Abdomen: Metallic coil with associated streak artifact in the right lobe of the liver. There is morphologic changes of cirrhosis. Small perihepatic ascites. Musculoskeletal: Osteopenia with degenerative changes of the spine. No acute osseous pathology. Right-sided Port-A-Cath with tip in the central SVC. Review of the MIP images confirms the above findings. IMPRESSION: 1. No CT evidence of pulmonary artery embolus. 2. 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 Further evaluation with echocardiogram, if not previously performed, recommended. 3. Findings of CHF and small right pleural effusion. 4. Patchy areas of ground-glass opacity throughout the lungs may represent edema or pneumonia. 5. Cirrhosis with small perihepatic ascites. 6. Aortic Atherosclerosis (ICD10-I70.0) and Emphysema (ICD10-J43.9). Electronically Signed   By: AAnner CreteM.D.   On: 11/27/2021 01:48  ? ?DG Chest Portable 1 View ? ?Result Date: 11/26/2021 ?CLINICAL DATA:  Shortness of breath and cough. EXAM: PORTABLE CHEST 1 VIEW COMPARISON:  September 08, 2021 FINDINGS: There  is stable right-sided venous Port-A-Cath positioning. Chronic appearing increased lung markings are seen with mild to moderate severity areas of atelectasis noted within the bilateral lung bases. There is no evidence of a pleural effusion or pneumothorax. The heart size and mediastinal contours are within normal limits. Degenerative changes seen within the thoracic spine. IMPRESSION: Chronic appearing increased lung markings with mild to moderate severity bibasilar atelectasis. Electronically Signed   By: TVirgina NorfolkM.D.   On: 11/26/2021 21:59  ? ?ECHOCARDIOGRAM LIMITED ? ?Result Date: 11/27/2021 ?   ECHOCARDIOGRAM LIMITED REPORT   Patient Name:   Jonathan DEELEYDate of Exam: 11/27/2021 Medical Rec #:  [id number]      Height:       69.0 in Accession #:    [id number]  Weight:       154.3 lb Date of Birth:  10/19/1955       BSA:          1.850 m? Patient Age:    15 years        BP:           101/67 mmHg Patient Gender: M               HR:           94 bpm. Exam Location:  [facility] Procedure: Limited Echo and Limited Color Doppler Indications:    CHF  History:        Patient has prior history of Echocardiogram examinations, most                 recent 05/27/2021. CHF, CAD and Previous Myocardial Infarction,                 PAD; Risk Factors:Hypertension, Diabetes, Dyslipidemia and                 Current Smoker.  Sonographer:    Wenda Low Referring Phys: [id number] [place]  1. Apex is akinetic. Distal anteroseptal, anterolateral, inferoseptal walls are hypokinetic. The basal inferior wall is aneurysmal.. Left ventricular ejection fraction, by estimation, is 40 to 45%. The left ventricle has mildly decreased function. The left ventricle demonstrates regional wall motion abnormalities (see scoring diagram/findings for description).  2. Right ventricular systolic function is normal. The right ventricular size is normal.  3. The mitral valve is abnormal. Mild mitral valve  regurgitation. No evidence of mitral stenosis.  4. The aortic valve has an indeterminant number of cusps. There is moderate calcification of the aortic valve. There is moderate thickening of the aortic valve. FINDINGS  L

## 2021-11-28 NOTE — Progress Notes (Signed)
?  ?       ?PROGRESS NOTE ? ?Jonathan Keith:[id number] DOB: 07-Jun-1956 DOA: 11/26/2021 ?PCP: [name], PA-C ? ?Brief History:  ?66 year old male with a history of cholangiocarcinoma, coronary artery disease with NSTEMI Feb 6063, systolic CHF, COPD, tobacco abuse, diabetes mellitus type 2, hyperlipidemia presenting with shortness of breath and coughing of 1 day duration.  The patient denies any fevers, chills, headache, neck pain, chest pain, hemoptysis, nausea, vomiting, diarrhea, abdominal pain.  He has not had any hematochezia or melena.  He continues to smoke 1 pack/day since the age of 73.  He denies any recent weight gain, worsening lower extremity edema or increasing abdominal girth.  There is no orthopnea type symptoms. ?In the ED, the patient was afebrile with soft blood pressures.  Initial oxygen saturation was 82% on room air.  He was placed on 4 L with saturation up to 100%.  WBC 19.0, hemoglobin 9.0, platelets 167,000.  WBC 133, potassium 4.3, bicarb 21, serum creatinine 1.00.  Chest x-ray showed chronic interstitial markings.  CTA chest was negative for PE but showed diffuse interstitial and interlobular septal prominence insistent with edema.  There is also patchy bilateral GGO.  There is signs of cirrhosis with perihepatic ascites.  The patient was given furosemide 40 mg IV.  Cardiology was consulted to assist with management.  ? ? ? ?Assessment and Plan: ?* Acute respiratory failure with hypoxia (HCC) ?Secondary to CHF/pulmonary edema ?Also concerned about possible underlying pneumonia with leukocytosis and GGO on CTA chest ?Stable on 2 L nasal cannula ?Presented with oxygen saturation 92% on room air with tachypnea ?Wean oxygen for saturation greater 92% ? ?Acute on chronic systolic CHF (congestive heart failure) ([place]) ?Continue IV furosemide>>neg 2.9L ?Cardiology consult appreciated ?Daily weights ?Accurate I's and O's ?11/27/21 echo-EF 40-45% +WMA, mild MR ?05/28/2021 echo EF 55 to 60%,  no WMA, trivial MR ?09/08/21 LHC EF 45-50% ? ?Lobar pneumonia ([place]) ?PCT 0.69 ?Continue ceftriaxone and azithro ? ?Elevated troponin ?Likely secondary to demand ischemia ?No chest pain present ?Continue empiric lovenox x 48 hr ?Troponin peaked 4130 ?09/08/2021 LHC--moderate to severe diffuse disease--with subtotally occluded serial distal LAD  ? ?Tobacco abuse ?Tobacco cessation discussed ?Over 50-pack-year history ? ?Coronary artery disease ?No chest pain presently ?09/08/21 LHC --Moderate to severe diffuse disease with subtotally occluded serial distal LAD lesions.  Given the burden of disease and the patient's comorbidities medical management pursued ?-continue plavix ? ?Hyponatremia ?Secondary to fluid overload ?Improved with diuresis ? ?Cholangiocarcinoma ([place]) ?Last chemotherapy 11/25/2021 with cisplatin and gemcitabine-cycle #7 ?Follow-up Dr. Delton Coombes ? ?Elevated LFTs ?Has been chronic secondary to cholangiocarcinoma ?-Monitor clinically ? ?Hyperlipidemia ?Continue statin ? ?Controlled diabetes mellitus type 2 with complications ([place]) ?Holding metformin ?09/08/2021 hemoglobin A1c 5.8 ?Allow for liberal glycemic control at this point ? ? ? ? ? ?Family Communication:   spouse updated at bedside 4/21 ? ?Consultants:  cardiology ? ?Code Status:  FULL ? ?DVT Prophylaxis:  [place] Lovenox ? ? ?Procedures: ?As Listed in Progress Note Above ? ?Antibiotics: ?Ceftriaxone 4/20>> ?Azithro 4/20>> ? ? ? ?Subjective: ?Patient denies fevers, chills, headache, chest pain, dyspnea, nausea, vomiting, diarrhea, abdominal pain, dysuria, hematuria, hematochezia, and melena. ? ? ?Objective: ?Vitals:  ? 11/28/21 0127 11/28/21 0545 11/28/21 0160 11/28/21 0948  ?BP: 92/60 98/66  99/62  ?Pulse: 94 89  94  ?Resp: 18 20    ?Temp: (!) 97.1 ?F (36.2 ?C) 98 ?F (36.7 ?C)  98.5 ?F (36.9 ?C)  ?TempSrc:    Oral  ?  SpO2: 92% 92%  97%  ?Weight:   73.9 kg   ?Height:      ? ? ?Intake/Output Summary (Last 24 hours) at 11/28/2021 1809 ?Last data filed at  11/28/2021 6546 ?Gross per 24 hour  ?Intake 120 ml  ?Output 200 ml  ?Net -80 ml  ? ?Weight change: 3.936 kg ?Exam: ? ?General:  Pt is alert, follows commands appropriately, not in acute distress ?HEENT: No icterus, No thrush, No neck mass, [place]/AT ?Cardiovascular: RRR, S1/S2, no rubs, no gallops ?Respiratory: bibasilar rales R>L ?Abdomen: Soft/+BS, non tender, non distended, no guarding ?Extremities: No edema, No lymphangitis, No petechiae, No rashes, no synovitis ? ? ?Data Reviewed: ?I have personally reviewed following labs and imaging studies ?Basic Metabolic Panel: ?Recent Labs  ?Lab 11/25/21 ?0749 11/26/21 ?2132 11/28/21 ?0433  ?NA 134* 133* 137  ?K 4.4 4.3 4.1  ?CL 103 104 102  ?CO2 24 21* 28  ?GLUCOSE 184* 145* 86  ?BUN 16 22 31*  ?CREATININE 0.97 1.00 1.03  ?CALCIUM 8.7* 8.7* 8.5*  ?MG 1.6*  --  2.1  ? ?Liver Function Tests: ?Recent Labs  ?Lab 11/25/21 ?0749 11/26/21 ?2132  ?AST 49* 76*  ?ALT 42 55*  ?ALKPHOS 412* 360*  ?BILITOT 1.1 1.3*  ?PROT 6.4* 6.6  ?ALBUMIN 2.9* 3.1*  ? ?Recent Labs  ?Lab 11/26/21 ?2132  ?LIPASE 67*  ? ?No results for input(s): AMMONIA in the last 168 hours. ?Coagulation Profile: ?No results for input(s): INR, PROTIME in the last 168 hours. ?CBC: ?Recent Labs  ?Lab 11/25/21 ?0749 11/26/21 ?2132 11/28/21 ?0433  ?WBC 8.9 19.0* 7.6  ?NEUTROABS 6.7 16.8*  --   ?HGB 9.1* 9.0* 7.9*  ?HCT 28.5* 26.9* 24.8*  ?MCV 105.2* 105.5* 106.0*  ?PLT 146* 167 147*  ? ?Cardiac Enzymes: ?No results for input(s): CKTOTAL, CKMB, CKMBINDEX, TROPONINI in the last 168 hours. ?BNP: ?Invalid input(s): POCBNP ?CBG: ?Recent Labs  ?Lab 11/27/21 ?2143 11/28/21 ?0804 11/28/21 ?1053 11/28/21 ?1637  ?GLUCAP 123* 92 136* 152*  ? ?HbA1C: ?No results for input(s): HGBA1C in the last 72 hours. ?Urine analysis: ?   ?Component Value Date/Time  ? COLORURINE AMBER (A) 05/25/2021 1412  ? APPEARANCEUR HAZY (A) 05/25/2021 1412  ? LABSPEC 1.033 (H) 05/25/2021 1412  ? PHURINE 5.0 05/25/2021 1412  ? GLUCOSEU NEGATIVE 05/25/2021 1412  ?  HGBUR NEGATIVE 05/25/2021 1412  ? BILIRUBINUR MODERATE (A) 05/25/2021 1412  ? KETONESUR 5 (A) 05/25/2021 1412  ? PROTEINUR 100 (A) 05/25/2021 1412  ? NITRITE NEGATIVE 05/25/2021 1412  ? LEUKOCYTESUR NEGATIVE 05/25/2021 1412  ? ?Sepsis Labs: ?'@LABRCNTIP'$ (procalcitonin:4,lacticidven:4) ?) ?Recent Results (from the past 240 hour(s))  ?Resp Panel by RT-PCR (Flu A&B, Covid) Nasopharyngeal Swab     Status: None  ? Collection Time: 11/26/21 10:00 PM  ? Specimen: Nasopharyngeal Swab; Nasopharyngeal(NP) swabs in vial transport medium  ?Result Value Ref Range Status  ? SARS Coronavirus 2 by RT PCR NEGATIVE NEGATIVE Final  ?  Comment: (NOTE) ?SARS-CoV-2 target nucleic acids are NOT DETECTED. ? ?The SARS-CoV-2 RNA is generally detectable in upper respiratory ?specimens during the acute phase of infection. The lowest ?concentration of SARS-CoV-2 viral copies this assay can detect is ?138 copies/mL. A negative result does not preclude SARS-Cov-2 ?infection and should not be used as the sole basis for treatment or ?other patient management decisions. A negative result may occur with  ?improper specimen collection/handling, submission of specimen other ?than nasopharyngeal swab, presence of viral mutation(s) within the ?areas targeted by this assay, and inadequate number of viral ?copies(<138 copies/mL).  A negative result must be combined with ?clinical observations, patient history, and epidemiological ?information. The expected result is Negative. ? ?Fact Sheet for Patients:  ?EntrepreneurPulse.com.au ? ?Fact Sheet for Healthcare Providers:  ?IncredibleEmployment.be ? ?This test is no t yet approved or cleared by the [place] FDA and  ?has been authorized for detection and/or diagnosis of SARS-CoV-2 by ?FDA under an Emergency Use Authorization (EUA). This EUA will remain  ?in effect (meaning this test can be used) for the duration of the ?COVID-19 declaration under Section 564(b)(1) of the  Act, 21 ?U.S.C.section 360bbb-3(b)(1), unless the authorization is terminated  ?or revoked sooner.  ? ? ?  ? Influenza A by PCR NEGATIVE NEGATIVE Final  ? Influenza B by PCR NEGATIVE NEGATIVE Final  ?  Comm

## 2021-11-28 NOTE — TOC Progression Note (Signed)
Transition of Care (TOC) - Progression Note  ? ? ?Patient Details  ?Name: Jonathan Keith ?MRN: [id number] ?Date of Birth: January 10, 1956 ? ?Transition of Care (TOC) CM/SW Contact  ?Boneta Lucks, RN ?Phone Number: ?11/28/2021, 2:07 PM ? ?Clinical Narrative:   Wife called back, waiting on cardiology and possibly discharge home. We discussed DME and Home health. She states they have no needs at this time.  ?  ?Barriers to Discharge: Continued Medical Work up ? ?Expected Discharge Plan and Services ?   ?   ?Readmission Risk Interventions ? ?  11/28/2021  ?  2:06 PM 05/27/2021  ? 12:06 PM  ?Readmission Risk Prevention Plan  ?Transportation Screening Complete Complete  ?Home Care Screening  Complete  ?Medication Review (RN CM)  Complete  ?Medication Review Press photographer) Complete   ?PCP or Specialist appointment within 3-5 days of discharge Not Complete   ?[place] or Home Care Consult Complete   ?SW Recovery Care/Counseling Consult Complete   ?Palliative Care Screening Not Complete   ?[place] Not Complete   ? ? ?

## 2021-11-28 NOTE — Progress Notes (Signed)
This morning lab called with troponin or 4130.  Patient resting comfortably with no chest pain.  Contacted Dr. Carles Collet who consulted with cardiology and decision was made to continue with lovenox and not start heparin dosing. Patient had skin tear to right arm and dressing replaced.  Port site has bandaid and is not accessed. Wife has been at bedside all day ?

## 2021-11-28 NOTE — Care Management Important Message (Signed)
Important Message ? ?Patient Details  ?Name: Jonathan Keith ?MRN: [id number] ?Date of Birth: [birth year] ? ? ?Medicare Important Message Given:  Yes ? ? ? ? ?Tommy Medal ?11/28/2021, 11:48 AM ?

## 2021-11-28 NOTE — Assessment & Plan Note (Addendum)
PCT 0.69 ?Continue ceftriaxone and azithro ?D/c home with cefdinir/azithro x 5 more days ?

## 2021-11-28 NOTE — Progress Notes (Deleted)
ANTICOAGULATION CONSULT NOTE -  ? ?Pharmacy Consult for Lovenox>> Heparin ?Indication: chest pain/ACS ? ?No Known Allergies ? ?Patient Measurements: ?Height: '5\' 9"'$  (175.3 cm) ?Weight: 73.9 kg (163 lb) ?IBW/kg (Calculated) : 70.7 ? ?Vital Signs: ?Temp: 98 ?F (36.7 ?C) (04/21 0545) ?BP: 98/66 (04/21 0545) ?Pulse Rate: 89 (04/21 0545) ? ?Labs: ?Recent Labs  ?  11/25/21 ?0749 11/26/21 ?2132 11/26/21 ?2309 11/27/21 ?0750 11/28/21 ?0433  ?HGB 9.1* 9.0*  --   --  7.9*  ?HCT 28.5* 26.9*  --   --  24.8*  ?PLT 146* 167  --   --  147*  ?HEPARINUNFRC  --   --   --  0.23*  --   ?CREATININE 0.97 1.00  --   --  1.03  ?TROPONINIHS  --  60* 232*  --  4,130*  ? ? ? ?Estimated Creatinine Clearance: 71.5 mL/min (by C-G formula based on SCr of 1.03 mg/dL). ? ? ?Medical History: ?Past Medical History:  ?Diagnosis Date  ? Cancer [facility])   ? CHF (congestive heart failure) ([place])   ? Chronic pain   ? neck, back, knees  ? Class 1 obesity due to excess calories with body mass index (BMI) of 31.0 to 31.9 in adult   ? [facility])   ? Coronary artery disease   ? DDD (degenerative disc disease), cervical   ? Diabetes mellitus ([place])   ? Dyslipidemia   ? Hypertension   ? Port-A-Cath in place 07/20/2021  ? Tobacco dependence   ? ? ?Medications:  ?No current facility-administered medications on file prior to encounter.  ? ?Current Outpatient Medications on File Prior to Encounter  ?Medication Sig Dispense Refill  ? acetaminophen (TYLENOL) 500 MG tablet Take 500 mg by mouth every 6 (six) hours as needed for moderate pain or headache.    ? aspirin EC 81 MG tablet Take 81 mg by mouth daily. Swallow whole.    ? atorvastatin (LIPITOR) 40 MG tablet Take 1 tablet (40 mg total) by mouth daily at 6 PM. 90 tablet 3  ? CISPLATIN IV Inject into the vein once a week. Days 1, 8 every 21 days    ? clopidogrel (PLAVIX) 75 MG tablet Take 1 tablet (75 mg total) by mouth daily with breakfast. 90 tablet 3  ? famotidine (PEPCID) 20 MG tablet Take 20 mg by mouth  daily.    ? ferrous sulfate (FERROUSUL) 325 (65 FE) MG tablet Take 1 tablet (325 mg total) by mouth daily with breakfast.    ? furosemide (LASIX) 20 MG tablet Take 20 mg daily as needed for swelling 90 tablet 1  ? Gemcitabine HCl (GEMZAR IV) Inject into the vein once a week. Days 1, 8 every 21 days    ? Ginseng 100 MG CAPS Take 1 capsule by mouth daily.    ? lidocaine-prilocaine (EMLA) cream Apply a small amount to port a cath site and cover with plastic wrap 1 hour prior to chemotherapy appointments 30 g 3  ? loratadine (CLARITIN) 10 MG tablet Take 10 mg by mouth daily. Takes for a few days after shot to boost WBC. About every 2nd treatment    ? metFORMIN (GLUCOPHAGE) 500 MG tablet Take 1 tablet (500 mg total) by mouth 2 (two) times daily with a meal. 60 tablet 0  ? midodrine (PROAMATINE) 5 MG tablet Take 1 tablet (5 mg total) by mouth 3 (three) times daily with meals. 270 tablet 3  ? nitroGLYCERIN (NITROSTAT) 0.4 MG SL tablet Place 1 tablet (0.4  mg total) under the tongue every 5 (five) minutes as needed for chest pain. 25 tablet 3  ? pantoprazole (PROTONIX) 40 MG tablet Take 1 tablet (40 mg total) by mouth daily. 90 tablet 3  ? prochlorperazine (COMPAZINE) 10 MG tablet Take 1 tablet (10 mg total) by mouth every 6 (six) hours as needed (Nausea or vomiting). 60 tablet 3  ? tamsulosin (FLOMAX) 0.4 MG CAPS capsule TAKE (2) CAPSULES BY MOUTH AT BEDTIME. (Patient taking differently: 0.4 mg at bedtime. If needed can take additional tablet) 60 capsule 2  ? Durvalumab (IMFINZI IV) Inject into the vein every 21 ( twenty-one) days.    ? megestrol (MEGACE) 400 MG/10ML suspension Take 10 mLs (400 mg total) by mouth 2 (two) times daily. (Patient not taking: Reported on 11/26/2021) 480 mL 0  ?  ? ?Assessment: ?66 y.o. male with elevated troponin, possible ACS, for heparin. ?CTA negative for PE, suggested CHF. INtrahepatic metastatic cholangiocarcinoma. Issues with anemia recently, stable. no intervention anticipated. ?MD  requested transition back to heparin. Will restart heparin at 1000 units/hr ? ?Goal of Therapy:  ?Heparin level 0.3-0.7 units/ml ?Monitor platelets by anticoagulation protocol: Yes ?  ?Plan:  ?Dc lovenox ?Restart heparin at 1000 units/hr at 1100 ?Check anti-Xa level ~6 hours after restart heparin and [facility] ?Monitor for S/S of bleeding ? ?Isac Sarna, BS Pharm D, BCPS ?Clinical Pharmacist ?11/28/2021 7:32 AM ? ? ? ? ?

## 2021-11-29 DIAGNOSIS — E118 Type 2 diabetes mellitus with unspecified complications: Secondary | ICD-10-CM | POA: Diagnosis not present

## 2021-11-29 DIAGNOSIS — J9601 Acute respiratory failure with hypoxia: Secondary | ICD-10-CM | POA: Diagnosis not present

## 2021-11-29 DIAGNOSIS — Z720 Tobacco use: Secondary | ICD-10-CM

## 2021-11-29 DIAGNOSIS — C221 Intrahepatic bile duct carcinoma: Secondary | ICD-10-CM | POA: Diagnosis not present

## 2021-11-29 DIAGNOSIS — I5023 Acute on chronic systolic (congestive) heart failure: Secondary | ICD-10-CM | POA: Diagnosis not present

## 2021-11-29 LAB — CBC
HCT: 25.5 % — ABNORMAL LOW (ref 39.0–52.0)
Hemoglobin: 8.1 g/dL — ABNORMAL LOW (ref 13.0–17.0)
MCH: 33.5 pg (ref 26.0–34.0)
MCHC: 31.8 g/dL (ref 30.0–36.0)
MCV: 105.4 fL — ABNORMAL HIGH (ref 80.0–100.0)
Platelets: 133 10*3/uL — ABNORMAL LOW (ref 150–400)
RBC: 2.42 MIL/uL — ABNORMAL LOW (ref 4.22–5.81)
RDW: 19.9 % — ABNORMAL HIGH (ref 11.5–15.5)
WBC: 5.1 10*3/uL (ref 4.0–10.5)
nRBC: 0 % (ref 0.0–0.2)

## 2021-11-29 LAB — BASIC METABOLIC PANEL
Anion gap: 6 (ref 5–15)
BUN: 32 mg/dL — ABNORMAL HIGH (ref 8–23)
CO2: 27 mmol/L (ref 22–32)
Calcium: 8.6 mg/dL — ABNORMAL LOW (ref 8.9–10.3)
Chloride: 104 mmol/L (ref 98–111)
Creatinine, Ser: 0.94 mg/dL (ref 0.61–1.24)
GFR, Estimated: >60 mL/min (ref >60)
Glucose, Bld: 92 mg/dL (ref 70–99)
Potassium: 4.3 mmol/L (ref 3.5–5.1)
Sodium: 137 mmol/L (ref 135–145)

## 2021-11-29 LAB — MAGNESIUM: Magnesium: 2.1 mg/dL (ref 1.7–2.4)

## 2021-11-29 MED ORDER — ALBUTEROL SULFATE HFA 108 (90 BASE) MCG/ACT IN AERS: 2.0000 | INHALATION_SPRAY | Freq: Four times a day (QID) | RESPIRATORY_TRACT | 1 refills | Status: DC | PRN

## 2021-11-29 MED ORDER — FUROSEMIDE 20 MG PO TABS
20.0000 mg | ORAL_TABLET | Freq: Every day | ORAL | Status: DC
Start: 2021-11-29 — End: 2021-11-29
  Administered 2021-11-29: 20 mg via ORAL
  Filled 2021-11-29: qty 1

## 2021-11-29 MED ORDER — ALBUTEROL SULFATE (2.5 MG/3ML) 0.083% IN NEBU: 3.0000 mL | INHALATION_SOLUTION | Freq: Four times a day (QID) | RESPIRATORY_TRACT | Status: DC | PRN

## 2021-11-29 MED ORDER — CEFDINIR 300 MG PO CAPS: 300.0000 mg | ORAL_CAPSULE | Freq: Two times a day (BID) | ORAL | 0 refills | Status: DC

## 2021-11-29 MED ORDER — AZITHROMYCIN 500 MG PO TABS: 500.0000 mg | ORAL_TABLET | Freq: Every day | ORAL | 0 refills | Status: DC

## 2021-11-29 MED ORDER — CEFDINIR 300 MG PO CAPS
300.0000 mg | ORAL_CAPSULE | Freq: Two times a day (BID) | ORAL | Status: DC
  Administered 2021-11-29: 300 mg via ORAL
  Filled 2021-11-29: qty 1

## 2021-11-29 NOTE — Progress Notes (Signed)
Was discharged earlier this morning.  Was resting comfortably with no chest pain and on room air.  IV removed and discharge paperwork reviewed with patient and wife.  Scripts sent to pharmacy. Transported by WC to entrance and wife driving home ?

## 2021-11-29 NOTE — Discharge Summary (Signed)
?Physician Discharge Summary ?  ?Patient: Jonathan Keith MRN: [id number] DOB: September 21, 1955  ?Admit date:     11/26/2021  ?Discharge date: 11/29/21  ?Discharge Physician: Shanon Brow Caterine Mcmeans  ? ?PCP: [name], PA-C  ? ?Recommendations at discharge:  ? Please follow up with primary care provider within 1-2 weeks ? Please repeat BMP and CBC in one week ? ? ? ? ?Hospital Course: ?66 year old male with a history of cholangiocarcinoma, coronary artery disease with NSTEMI Feb 9833, systolic CHF, COPD, tobacco abuse, diabetes mellitus type 2, hyperlipidemia presenting with shortness of breath and coughing of 1 day duration.  The patient denies any fevers, chills, headache, neck pain, chest pain, hemoptysis, nausea, vomiting, diarrhea, abdominal pain.  He has not had any hematochezia or melena.  He continues to smoke 1 pack/day since the age of 58.  He denies any recent weight gain, worsening lower extremity edema or increasing abdominal girth.  There is no orthopnea type symptoms. ?In the ED, the patient was afebrile with soft blood pressures.  Initial oxygen saturation was 82% on room air.  He was placed on 4 L with saturation up to 100%.  WBC 19.0, hemoglobin 9.0, platelets 167,000.  WBC 133, potassium 4.3, bicarb 21, serum creatinine 1.00.  Chest x-ray showed chronic interstitial markings.  CTA chest was negative for PE but showed diffuse interstitial and interlobular septal prominence insistent with edema.  There is also patchy bilateral GGO.  There is signs of cirrhosis with perihepatic ascites.  The patient was given furosemide 40 mg IV.  Cardiology was consulted to assist with management. ? ?Assessment and Plan: ?* Acute respiratory failure with hypoxia ([place]) ?Secondary to CHF/pulmonary edema ?Also concerned about possible underlying pneumonia with leukocytosis and GGO on CTA chest ?Stable on 2 L nasal cannula>>RA ?Presented with oxygen saturation 92% on room air with tachypnea ?Wean oxygen for saturation greater  92% ? ?Acute on chronic systolic CHF (congestive heart failure) ([place]) ?Continue IV furosemide>>neg 2.9L ?Cardiology consult appreciated ?Daily weights--D/C weight 148 lbs ?Accurate I's and O's ?11/27/21 echo-EF 40-45% +WMA, mild MR ?05/28/2021 echo EF 55 to 60%, no WMA, trivial MR ?09/08/21 [facility] EF 45-50% ?Pt given instructions to weigh himself daily and take prn lasix if gains >2 lbs  ? ?Lobar pneumonia ([place]) ?PCT 0.69 ?Continue ceftriaxone and azithro ?D/c home with cefdinir/azithro x 5 more days ? ?Elevated troponin ?Likely secondary to demand ischemia ?No chest pain present ?Finished empiric lovenox x 48 hr ?Troponin peaked 4130 ?09/08/2021 LHC--moderate to severe diffuse disease--with subtotally occluded serial distal LAD  ?Seen by cardiology>>not a candidate for invasive intervention, continue plavix ? ?Tobacco abuse ?Tobacco cessation discussed ?Over 50-pack-year history ?Prn albuterol prescribed at time of d/c ? ?Coronary artery disease ?No chest pain presently ?09/08/21 LHC --Moderate to severe diffuse disease with subtotally occluded serial distal LAD lesions.  Given the burden of disease and the patient's comorbidities medical management pursued ?-continue plavix ? ?Hyponatremia ?Secondary to fluid overload ?Improved with diuresis ? ?Cholangiocarcinoma ([place]) ?Last chemotherapy 11/25/2021 with cisplatin and gemcitabine-cycle #7 ?Follow-up Dr. Delton Coombes ? ?Elevated LFTs ?Has been chronic secondary to cholangiocarcinoma ?-Monitor clinically ? ?Hyperlipidemia ?Continue statin ? ?Controlled diabetes mellitus type 2 with complications ([place]) ?Holding metformin>>restart after d/c ?09/08/2021 hemoglobin A1c 5.8 ?Allow for liberal glycemic control at this point ? ? ? ? ?  ? ? ?Consultants: cardiology ?Procedures performed: none  ?Disposition: Home ?Diet recommendation:  ?Cardiac diet ?DISCHARGE MEDICATION: ?Allergies as of 11/29/2021   ?No Known Allergies ?  ? ?  ?Medication  List  ?  ? ?STOP taking these medications    ? ?megestrol 400 MG/10ML suspension ?Commonly known as: MEGACE ?  ? ?  ? ?TAKE these medications   ? ?acetaminophen 500 MG tablet ?Commonly known as: TYLENOL ?Take 500 mg by mouth every 6 (six) hours as needed for moderate pain or headache. ?  ?albuterol 108 (90 Base) MCG/ACT inhaler ?Commonly known as: VENTOLIN HFA ?Inhale 2 puffs into the lungs every 6 (six) hours as needed for wheezing or shortness of breath. ?  ?aspirin EC 81 MG tablet ?Take 81 mg by mouth daily. Swallow whole. ?  ?atorvastatin 40 MG tablet ?Commonly known as: LIPITOR ?Take 1 tablet (40 mg total) by mouth daily at 6 PM. ?  ?azithromycin 500 MG tablet ?Commonly known as: ZITHROMAX ?Take 1 tablet (500 mg total) by mouth daily. ?  ?cefdinir 300 MG capsule ?Commonly known as: OMNICEF ?Take 1 capsule (300 mg total) by mouth every 12 (twelve) hours. ?  ?CISPLATIN IV ?Inject into the vein once a week. Days 1, 8 every 21 days ?  ?clopidogrel 75 MG tablet ?Commonly known as: PLAVIX ?Take 1 tablet (75 mg total) by mouth daily with breakfast. ?  ?famotidine 20 MG tablet ?Commonly known as: PEPCID ?Take 20 mg by mouth daily. ?  ?ferrous sulfate 325 (65 FE) MG tablet ?Commonly known as: FerrouSul ?Take 1 tablet (325 mg total) by mouth daily with breakfast. ?  ?furosemide 20 MG tablet ?Commonly known as: LASIX ?Take 20 mg daily as needed for swelling ?  ?GEMZAR IV ?Inject into the vein once a week. Days 1, 8 every 21 days ?  ?Ginseng 100 MG Caps ?Take 1 capsule by mouth daily. ?  ?IMFINZI IV ?Inject into the vein every 21 ( twenty-one) days. ?  ?lidocaine-prilocaine cream ?Commonly known as: EMLA ?Apply a small amount to port a cath site and cover with plastic wrap 1 hour prior to chemotherapy appointments ?  ?loratadine 10 MG tablet ?Commonly known as: CLARITIN ?Take 10 mg by mouth daily. Takes for a few days after shot to boost WBC. About every 2nd treatment ?  ?metFORMIN 500 MG tablet ?Commonly known as: GLUCOPHAGE ?Take 1 tablet (500 mg total) by mouth 2  (two) times daily with a meal. ?  ?midodrine 5 MG tablet ?Commonly known as: PROAMATINE ?Take 1 tablet (5 mg total) by mouth 3 (three) times daily with meals. ?  ?nitroGLYCERIN 0.4 MG SL tablet ?Commonly known as: Nitrostat ?Place 1 tablet (0.4 mg total) under the tongue every 5 (five) minutes as needed for chest pain. ?  ?pantoprazole 40 MG tablet ?Commonly known as: PROTONIX ?Take 1 tablet (40 mg total) by mouth daily. ?  ?prochlorperazine 10 MG tablet ?Commonly known as: COMPAZINE ?Take 1 tablet (10 mg total) by mouth every 6 (six) hours as needed (Nausea or vomiting). ?  ?tamsulosin 0.4 MG Caps capsule ?Commonly known as: FLOMAX ?TAKE (2) CAPSULES BY MOUTH AT BEDTIME. ?What changed:  ?how much to take ?when to take this ?additional instructions ?  ? ?  ? ? ?Discharge Exam: ?Filed Weights  ? 11/26/21 2045 11/28/21 8115 11/29/21 7262  ?Weight: 70 kg 73.9 kg 67.1 kg  ? ?HEENT:  [place]/AT, No thrush, no icterus ?CV:  RRR, no rub, no S3, no S4 ?Lung:  CTA, no wheeze, no rhonchi ?Abd:  soft/+BS, NT ?Ext:  No edema, no lymphangitis, no synovitis, no rash ? ? ?Condition at discharge: stable ? ?The results of significant diagnostics from this hospitalization (including  imaging, microbiology, ancillary and laboratory) are listed below for reference.  ? ?Imaging Studies: ?CT Angio Chest PE W/Cm &/Or Wo Cm ? ?Result Date: 11/27/2021 ?CLINICAL DATA:  Concern for pulmonary embolism. EXAM: CT ANGIOGRAPHY CHEST WITH CONTRAST TECHNIQUE: Multidetector CT imaging of the chest was performed using the standard protocol during bolus administration of intravenous contrast. Multiplanar CT image reconstructions and MIPs were obtained to evaluate the vascular anatomy. RADIATION DOSE REDUCTION: This exam was performed according to the departmental dose-optimization program which includes automated exposure control, adjustment of the mA and/or kV according to patient size and/or use of iterative reconstruction technique. CONTRAST:  160m  OMNIPAQUE IOHEXOL 350 MG/ML SOLN COMPARISON:  Chest CT dated 10/09/2021. FINDINGS: Cardiovascular: No cardiomegaly. 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 Further evaluation with echocar

## 2021-12-01 ENCOUNTER — Telehealth (HOSPITAL_COMMUNITY): Payer: Self-pay | Admitting: *Deleted

## 2021-12-01 NOTE — Telephone Encounter (Signed)
Received call from wife advising that he has been released from the hospital with diagnosis of pneumonia.  Treatment cancelled for this week and will resume as scheduled next week.  Made her aware and to call if they have any needs prior to follow up.  Verbalized understanding. ?

## 2021-12-02 ENCOUNTER — Other Ambulatory Visit (HOSPITAL_COMMUNITY): Payer: Medicare Other

## 2021-12-02 ENCOUNTER — Ambulatory Visit (HOSPITAL_COMMUNITY): Payer: Medicare Other

## 2021-12-04 ENCOUNTER — Ambulatory Visit (HOSPITAL_COMMUNITY): Payer: Medicare Other

## 2021-12-05 ENCOUNTER — Telehealth: Payer: Self-pay | Admitting: Cardiovascular Disease

## 2021-12-05 MED ORDER — PANTOPRAZOLE SODIUM 40 MG PO TBEC: 40.0000 mg | DELAYED_RELEASE_TABLET | Freq: Every day | ORAL | 3 refills | Status: DC

## 2021-12-05 MED ORDER — CLOPIDOGREL BISULFATE 75 MG PO TABS: 75.0000 mg | ORAL_TABLET | Freq: Every day | ORAL | 3 refills | Status: DC

## 2021-12-05 MED ORDER — ATORVASTATIN CALCIUM 40 MG PO TABS: 40.0000 mg | ORAL_TABLET | Freq: Every day | ORAL | 3 refills | Status: DC

## 2021-12-05 NOTE — Telephone Encounter (Signed)
Completed.

## 2021-12-05 NOTE — Telephone Encounter (Signed)
? ?*  STAT* If patient is at the pharmacy, call can be transferred to refill team. ? ? ?1. Which medications need to be refilled? (please list name of each medication and dose if known) atorvastatin (LIPITOR) 40 MG tablet ? clopidogrel (PLAVIX) 75 MG tablet  ? ? pantoprazole (PROTONIX) 40 MG tablet  ? ?2. Which pharmacy/location (including street and city if local pharmacy) is medication to be sent to?[place], [place] ? ?3. Do they need a 30 day or 90 day supply? 90 days ?

## 2021-12-09 ENCOUNTER — Other Ambulatory Visit (HOSPITAL_COMMUNITY): Payer: Medicare Other

## 2021-12-09 ENCOUNTER — Ambulatory Visit (HOSPITAL_COMMUNITY): Payer: Medicare Other

## 2021-12-11 ENCOUNTER — Ambulatory Visit (HOSPITAL_COMMUNITY): Payer: Medicare Other

## 2021-12-15 ENCOUNTER — Ambulatory Visit (HOSPITAL_BASED_OUTPATIENT_CLINIC_OR_DEPARTMENT_OTHER): Payer: Medicare Other

## 2021-12-15 ENCOUNTER — Telehealth: Payer: Self-pay | Admitting: Cardiovascular Disease

## 2021-12-15 MED ORDER — MIDODRINE HCL 5 MG PO TABS: 5.0000 mg | ORAL_TABLET | Freq: Three times a day (TID) | ORAL | 3 refills | Status: DC

## 2021-12-15 NOTE — Telephone Encounter (Signed)
Completed- refill sent to [facility] per pt's request. ?

## 2021-12-15 NOTE — Telephone Encounter (Signed)
? ?*  STAT* If patient is at the pharmacy, call can be transferred to refill team. ? ? ?1. Which medications need to be refilled? (please list name of each medication and dose if known) midodrine (PROAMATINE) 5 MG tablet ? ?2. Which pharmacy/location (including street and city if local pharmacy) is medication to be sent to? [place], [place] ? ?3. Do they need a 30 day or 90 day supply? 90 days ? ? ?Pt is out of meds needs refill asap ?

## 2021-12-16 ENCOUNTER — Ambulatory Visit (HOSPITAL_BASED_OUTPATIENT_CLINIC_OR_DEPARTMENT_OTHER)
Admission: RE | Admit: 2021-12-16 | Discharge: 2021-12-16 | Disposition: A | Discharge status: Home / Self Care | Payer: Medicare Other | Source: Ambulatory Visit | Attending: Hematology | Admitting: Hematology

## 2021-12-16 ENCOUNTER — Encounter (HOSPITAL_COMMUNITY): Payer: Self-pay | Admitting: *Deleted

## 2021-12-16 DIAGNOSIS — C221 Intrahepatic bile duct carcinoma: Secondary | ICD-10-CM | POA: Insufficient documentation

## 2021-12-16 MED ORDER — IOHEXOL 300 MG/ML  SOLN
75.0000 mL | Freq: Once | INTRAMUSCULAR | Status: AC | PRN
  Administered 2021-12-16: 75 mL via INTRAVENOUS

## 2021-12-16 NOTE — Progress Notes (Signed)
Patient's wife called asking if patient can resume his compazine.  He has been off his antibiotics now for 2 days.  After speaking with Tarri Abernethy, PA, patient is okay to resume compazine for nausea.  I have returned call to patients wife and she verbalized understanding.  She will call us should they have any additional questions.  ?

## 2021-12-18 ENCOUNTER — Telehealth: Payer: Self-pay | Admitting: Cardiovascular Disease

## 2021-12-18 NOTE — Telephone Encounter (Signed)
Spoke with pt's wife who states that pt has gained 1 pound per week over the last 3 weeks. She reports that pt is eating better but has decreased exercise. Informed pt's wife to call back if pt has symptoms, gains 3 pounds overnight or 5 pounds in a week.  Wife voiced understanding.  ?

## 2021-12-18 NOTE — Telephone Encounter (Signed)
Pt c/o swelling: STAT is pt has developed SOB within 24 hours ? ?If swelling, where is the swelling located? No swelling ? ?How much weight have you gained and in what time span?  Patient have gained a pound a week in the last 3 weeks ? ?Have you gained 3 pounds in a day or 5 pounds in a week?  ? ?Do you have a log of your daily weights (if so, list)?  ? ?Are you currently taking a fluid pill?  Have one, but not taking it, it said as he needs it ? ?Are you currently SOB? no ? ?Have you traveled recently? no ? ?

## 2021-12-23 ENCOUNTER — Ambulatory Visit (HOSPITAL_COMMUNITY): Payer: Medicare Other | Admitting: Hematology

## 2021-12-23 ENCOUNTER — Ambulatory Visit (HOSPITAL_COMMUNITY): Payer: Medicare Other

## 2021-12-23 ENCOUNTER — Other Ambulatory Visit (HOSPITAL_COMMUNITY): Payer: Medicare Other

## 2021-12-26 ENCOUNTER — Other Ambulatory Visit (HOSPITAL_COMMUNITY): Payer: Self-pay | Admitting: Hematology

## 2021-12-26 DIAGNOSIS — C221 Intrahepatic bile duct carcinoma: Secondary | ICD-10-CM

## 2021-12-26 DIAGNOSIS — Z95828 Presence of other vascular implants and grafts: Secondary | ICD-10-CM

## 2021-12-29 ENCOUNTER — Telehealth: Payer: Self-pay | Admitting: Cardiovascular Disease

## 2021-12-29 NOTE — Telephone Encounter (Signed)
Pt c/o medication issue:  1. Name of Medication:  clopidogrel (PLAVIX) 75 MG tablet  2. How are you currently taking this medication (dosage and times per day)?  As prescribed  3. Are you having a reaction (difficulty breathing--STAT)?   4. What is your medication issue?   Patient's wife states the patient has had a tooth ache and will need to have a tooth pulled. Per wife, the dentist office performing the extraction has not requested any clearance, but she would like to know if he needs to hold Plavix. Please advise as able.

## 2021-12-29 NOTE — Telephone Encounter (Signed)
Returned call Jonathan Keith Jonathan Keith's wife(EC) she states that Jonathan Keith needs to have a tooth pulled. Informed her that dentist office will have to fax clearance request, fax number given.

## 2021-12-30 ENCOUNTER — Inpatient Hospital Stay (HOSPITAL_COMMUNITY): Payer: Medicare Other

## 2021-12-30 ENCOUNTER — Inpatient Hospital Stay (HOSPITAL_BASED_OUTPATIENT_CLINIC_OR_DEPARTMENT_OTHER): Payer: Medicare Other | Admitting: Hematology

## 2021-12-30 ENCOUNTER — Inpatient Hospital Stay (HOSPITAL_COMMUNITY): Payer: Medicare Other | Attending: Hematology

## 2021-12-30 ENCOUNTER — Other Ambulatory Visit (HOSPITAL_COMMUNITY): Payer: Medicare Other

## 2021-12-30 ENCOUNTER — Ambulatory Visit (HOSPITAL_COMMUNITY): Payer: Medicare Other

## 2021-12-30 VITALS — BP 126/74 | HR 72 | Temp 96.7°F | Resp 18 | Wt 146.9 lb

## 2021-12-30 DIAGNOSIS — Z5111 Encounter for antineoplastic chemotherapy: Secondary | ICD-10-CM | POA: Diagnosis present

## 2021-12-30 DIAGNOSIS — R42 Dizziness and giddiness: Secondary | ICD-10-CM | POA: Diagnosis not present

## 2021-12-30 DIAGNOSIS — E785 Hyperlipidemia, unspecified: Secondary | ICD-10-CM | POA: Diagnosis not present

## 2021-12-30 DIAGNOSIS — I2510 Atherosclerotic heart disease of native coronary artery without angina pectoris: Secondary | ICD-10-CM | POA: Diagnosis not present

## 2021-12-30 DIAGNOSIS — R1110 Vomiting, unspecified: Secondary | ICD-10-CM | POA: Insufficient documentation

## 2021-12-30 DIAGNOSIS — C221 Intrahepatic bile duct carcinoma: Secondary | ICD-10-CM

## 2021-12-30 DIAGNOSIS — E119 Type 2 diabetes mellitus without complications: Secondary | ICD-10-CM | POA: Diagnosis not present

## 2021-12-30 DIAGNOSIS — Z5986 Financial insecurity: Secondary | ICD-10-CM | POA: Diagnosis not present

## 2021-12-30 DIAGNOSIS — Z5112 Encounter for antineoplastic immunotherapy: Secondary | ICD-10-CM | POA: Insufficient documentation

## 2021-12-30 DIAGNOSIS — R0602 Shortness of breath: Secondary | ICD-10-CM | POA: Diagnosis not present

## 2021-12-30 DIAGNOSIS — F17210 Nicotine dependence, cigarettes, uncomplicated: Secondary | ICD-10-CM | POA: Diagnosis not present

## 2021-12-30 DIAGNOSIS — R5383 Other fatigue: Secondary | ICD-10-CM | POA: Insufficient documentation

## 2021-12-30 DIAGNOSIS — I700 Atherosclerosis of aorta: Secondary | ICD-10-CM | POA: Insufficient documentation

## 2021-12-30 DIAGNOSIS — I509 Heart failure, unspecified: Secondary | ICD-10-CM | POA: Insufficient documentation

## 2021-12-30 DIAGNOSIS — I110 Hypertensive heart disease with heart failure: Secondary | ICD-10-CM | POA: Diagnosis not present

## 2021-12-30 DIAGNOSIS — Z79899 Other long term (current) drug therapy: Secondary | ICD-10-CM | POA: Insufficient documentation

## 2021-12-30 DIAGNOSIS — Z95828 Presence of other vascular implants and grafts: Secondary | ICD-10-CM

## 2021-12-30 DIAGNOSIS — Z8249 Family history of ischemic heart disease and other diseases of the circulatory system: Secondary | ICD-10-CM | POA: Diagnosis not present

## 2021-12-30 DIAGNOSIS — R634 Abnormal weight loss: Secondary | ICD-10-CM | POA: Diagnosis not present

## 2021-12-30 LAB — COMPREHENSIVE METABOLIC PANEL
ALT: 49 U/L — ABNORMAL HIGH (ref 0–44)
AST: 58 U/L — ABNORMAL HIGH (ref 15–41)
Albumin: 3.1 g/dL — ABNORMAL LOW (ref 3.5–5.0)
Alkaline Phosphatase: 426 U/L — ABNORMAL HIGH (ref 38–126)
Anion gap: 6 (ref 5–15)
BUN: 17 mg/dL (ref 8–23)
CO2: 23 mmol/L (ref 22–32)
Calcium: 9.1 mg/dL (ref 8.9–10.3)
Chloride: 105 mmol/L (ref 98–111)
Creatinine, Ser: 1.03 mg/dL (ref 0.61–1.24)
GFR, Estimated: >60 mL/min (ref >60)
Glucose, Bld: 170 mg/dL — ABNORMAL HIGH (ref 70–99)
Potassium: 3.8 mmol/L (ref 3.5–5.1)
Sodium: 134 mmol/L — ABNORMAL LOW (ref 135–145)
Total Bilirubin: 1.4 mg/dL — ABNORMAL HIGH (ref 0.3–1.2)
Total Protein: 6.8 g/dL (ref 6.5–8.1)

## 2021-12-30 LAB — CBC WITH DIFFERENTIAL/PLATELET
Abs Immature Granulocytes: 0.01 10*3/uL (ref 0.00–0.07)
Basophils Absolute: 0 10*3/uL (ref 0.0–0.1)
Basophils Relative: 1 %
Eosinophils Absolute: 0.2 10*3/uL (ref 0.0–0.5)
Eosinophils Relative: 3 %
HCT: 33.7 % — ABNORMAL LOW (ref 39.0–52.0)
Hemoglobin: 11.1 g/dL — ABNORMAL LOW (ref 13.0–17.0)
Immature Granulocytes: 0 %
Lymphocytes Relative: 22 %
Lymphs Abs: 1.2 10*3/uL (ref 0.7–4.0)
MCH: 34 pg (ref 26.0–34.0)
MCHC: 32.9 g/dL (ref 30.0–36.0)
MCV: 103.4 fL — ABNORMAL HIGH (ref 80.0–100.0)
Monocytes Absolute: 0.6 10*3/uL (ref 0.1–1.0)
Monocytes Relative: 12 %
Neutro Abs: 3.3 10*3/uL (ref 1.7–7.7)
Neutrophils Relative %: 62 %
Platelets: 96 10*3/uL — ABNORMAL LOW (ref 150–400)
RBC: 3.26 MIL/uL — ABNORMAL LOW (ref 4.22–5.81)
RDW: 13.7 % (ref 11.5–15.5)
WBC: 5.4 10*3/uL (ref 4.0–10.5)
nRBC: 0 % (ref 0.0–0.2)

## 2021-12-30 LAB — TSH: TSH: 0.456 u[IU]/mL (ref 0.350–4.500)

## 2021-12-30 LAB — MAGNESIUM: Magnesium: 1.8 mg/dL (ref 1.7–2.4)

## 2021-12-30 MED ORDER — HEPARIN SOD (PORK) LOCK FLUSH 100 UNIT/ML IV SOLN
500.0000 [IU] | Freq: Once | INTRAVENOUS | Status: AC | PRN
  Administered 2021-12-30: 500 [IU]

## 2021-12-30 MED ORDER — SODIUM CHLORIDE 0.9 % IV SOLN: Freq: Once | INTRAVENOUS | Status: AC

## 2021-12-30 MED ORDER — PALONOSETRON HCL INJECTION 0.25 MG/5ML
0.2500 mg | Freq: Once | INTRAVENOUS | Status: AC
  Administered 2021-12-30: 0.25 mg via INTRAVENOUS
  Filled 2021-12-30: qty 5

## 2021-12-30 MED ORDER — POTASSIUM CHLORIDE IN NACL 20-0.9 MEQ/L-% IV SOLN
Freq: Once | INTRAVENOUS | Status: AC
  Filled 2021-12-30: qty 1000

## 2021-12-30 MED ORDER — SODIUM CHLORIDE 0.9 % IV SOLN
150.0000 mg | Freq: Once | INTRAVENOUS | Status: AC
  Administered 2021-12-30: 150 mg via INTRAVENOUS
  Filled 2021-12-30: qty 5

## 2021-12-30 MED ORDER — SODIUM CHLORIDE 0.9 % IV SOLN
500.0000 mg/m2 | Freq: Once | INTRAVENOUS | Status: AC
  Administered 2021-12-30: 950 mg via INTRAVENOUS
  Filled 2021-12-30: qty 24.98

## 2021-12-30 MED ORDER — SODIUM CHLORIDE 0.9 % IV SOLN
1500.0000 mg | Freq: Once | INTRAVENOUS | Status: AC
  Administered 2021-12-30: 1500 mg via INTRAVENOUS
  Filled 2021-12-30: qty 30

## 2021-12-30 MED ORDER — SODIUM CHLORIDE 0.9 % IV SOLN
10.0000 mg | Freq: Once | INTRAVENOUS | Status: AC
  Administered 2021-12-30: 10 mg via INTRAVENOUS
  Filled 2021-12-30: qty 1

## 2021-12-30 MED ORDER — SODIUM CHLORIDE 0.9 % IV SOLN
25.0000 mg/m2 | Freq: Once | INTRAVENOUS | Status: AC
  Administered 2021-12-30: 48 mg via INTRAVENOUS
  Filled 2021-12-30: qty 48

## 2021-12-30 MED ORDER — MAGNESIUM SULFATE 2 GM/50ML IV SOLN
2.0000 g | Freq: Once | INTRAVENOUS | Status: AC
  Administered 2021-12-30: 2 g via INTRAVENOUS
  Filled 2021-12-30: qty 50

## 2021-12-30 MED ORDER — SODIUM CHLORIDE 0.9% FLUSH
10.0000 mL | INTRAVENOUS | Status: DC | PRN
  Administered 2021-12-30: 10 mL

## 2021-12-30 NOTE — Patient Instructions (Signed)
[facility]  Discharge Instructions: Thank you for choosing [facility] to provide your oncology and hematology care.  If you have a lab appointment with the [facility], please come in thru the Main Entrance and check in at the main information desk.  Wear comfortable clothing and clothing appropriate for easy access to any Portacath or PICC line.   We strive to give you quality time with your provider. You may need to reschedule your appointment if you arrive late (15 or more minutes).  Arriving late affects you and other patients whose appointments are after yours.  Also, if you miss three or more appointments without notifying the office, you may be dismissed from the clinic at the provider's discretion.      For prescription refill requests, have your pharmacy contact our office and allow 72 hours for refills to be completed.    Today you received the following chemotherapy and/or immunotherapy agents Imfinzi/Gemzar/Cisplatin      To help prevent nausea and vomiting after your treatment, we encourage you to take your nausea medication as directed.  BELOW ARE SYMPTOMS THAT SHOULD BE REPORTED IMMEDIATELY: *FEVER GREATER THAN 100.4 F (38 C) OR HIGHER *CHILLS OR SWEATING *NAUSEA AND VOMITING THAT IS NOT CONTROLLED WITH YOUR NAUSEA MEDICATION *UNUSUAL SHORTNESS OF BREATH *UNUSUAL BRUISING OR BLEEDING *URINARY PROBLEMS (pain or burning when urinating, or frequent urination) *BOWEL PROBLEMS (unusual diarrhea, constipation, pain near the anus) TENDERNESS IN MOUTH AND THROAT WITH OR WITHOUT PRESENCE OF ULCERS (sore throat, sores in mouth, or a toothache) UNUSUAL RASH, SWELLING OR PAIN  UNUSUAL VAGINAL DISCHARGE OR ITCHING   Items with * indicate a potential emergency and should be followed up as soon as possible or go to the Emergency Department if any problems should occur.  Please show the CHEMOTHERAPY ALERT CARD or IMMUNOTHERAPY ALERT CARD at check-in to the  Emergency Department and triage nurse.  Should you have questions after your visit or need to cancel or reschedule your appointment, please contact [facility] [phone]  and follow the prompts.  Office hours are 8:00 a.m. to 4:30 p.m. Monday - Friday. Please note that voicemails left after 4:00 p.m. may not be returned until the following business day.  We are closed weekends and major holidays. You have access to a nurse at all times for urgent questions. Please call the main number to the clinic [phone] and follow the prompts.  For any non-urgent questions, you may also contact your provider using MyChart. We now offer e-Visits for anyone 20 and older to request care online for non-urgent symptoms. For details visit [email or web address].   Also download the MyChart app! Go to the app store, search "MyChart", open the app, select [facility], and log in with your MyChart username and password.  Due to Covid, a mask is required upon entering the hospital/clinic. If you do not have a mask, one will be given to you upon arrival. For doctor visits, patients may have 1 support person aged 9 or older with them. For treatment visits, patients cannot have anyone with them due to current Covid guidelines and our immunocompromised population.

## 2021-12-30 NOTE — Progress Notes (Signed)
Patients port flushed without difficulty.  Good blood return noted with no bruising or swelling noted at site.  Stable during access and blood draw.  Patient to remain accessed for treatment. 

## 2021-12-30 NOTE — Progress Notes (Signed)
[place], [place] [address]   CLINIC:  Medical Oncology/Hematology  PCP:  Ginger Organ (Inactive) [address] / [place] [address] [phone]   REASON FOR VISIT:  Follow-up for intrahepatic cholangiocarcinoma  PRIOR THERAPY: none  NGS Results: not done  CURRENT THERAPY: Cisplatin + Gemcitabine D1,8 q21d  BRIEF ONCOLOGIC HISTORY:  Oncology History  Cholangiocarcinoma ([place])  02/28/2021 Initial Diagnosis   Cholangiocarcinoma ([place])    07/17/2021 Cancer Staging   Staging form: Perihilar Bile Ducts, AJCC 8th Edition - Clinical stage from 07/17/2021: Stage IVB (cTX, cNX, pM1) - Signed by Derek Jack, MD on 07/17/2021 Stage prefix: Initial diagnosis    07/22/2021 -  Chemotherapy   Patient is on Treatment Plan : BILIARY TRACT Cisplatin + Gemcitabine D1,8 q21d        CANCER STAGING: Cancer Staging  Cholangiocarcinoma ([place]) Staging form: Perihilar Bile Ducts, AJCC 8th Edition - Clinical stage from 07/17/2021: Stage IVB (cTX, cNX, pM1) - Signed by Derek Jack, MD on 07/17/2021   INTERVAL HISTORY:  [name], a 66 y.o. male, returns for routine follow-up of his intrahepatic cholangiocarcinoma. Jonathan Keith was last seen on 11/25/2021.   Today he reports feeling good. He has been monitoring and keeping a log of his weight at home, and his weight is stable. He was admitted to the hospital on 4/19 for pneumonia at which time he reports he had severe SOB, but he denies having fevers, chills, or cough at that time. He reports a toothache for which he has been taking tylenol and robitussin.   REVIEW OF SYSTEMS:  Review of Systems  Constitutional:  Positive for fatigue. Negative for appetite change, chills, fever and unexpected weight change.  Respiratory:  Negative for cough.   Gastrointestinal:  Positive for vomiting.  Neurological:  Positive for dizziness.  All other systems reviewed and are  negative.  PAST MEDICAL/SURGICAL HISTORY:  Past Medical History:  Diagnosis Date   Cancer ([place])    CHF (congestive heart failure) (HCC)    Chronic pain    neck, back, knees   Class 1 obesity due to excess calories with body mass index (BMI) of 31.0 to 31.9 in adult    Claudication [facility])    Coronary artery disease    DDD (degenerative disc disease), cervical    Diabetes mellitus ([place])    Dyslipidemia    Hypertension    Port-A-Cath in place 07/20/2021   Tobacco dependence    Past Surgical History:  Procedure Laterality Date   BILIARY BRUSHING N/A 02/25/2021   Procedure: BILIARY BRUSHING;  Surgeon: Rogene Houston, MD;  Location: [facility];  Service: Gastroenterology;  Laterality: N/A;   BILIARY STENT PLACEMENT N/A 02/25/2021   Procedure: BILIARY STENT PLACEMENT;  Surgeon: Rogene Houston, MD;  Location: [facility];  Service: Gastroenterology;  Laterality: N/A;   BILIARY STENT PLACEMENT  02/27/2021   Procedure: BILIARY STENT PLACEMENT (10FR x 9cm) IN THE RIGHT SYSTEM;  Surgeon: Rogene Houston, MD;  Location: [facility];  Service: Gastroenterology;;   BREAST SURGERY Left    benign lump- in his 87s.   CARDIAC CATHETERIZATION     ERCP N/A 02/25/2021   Procedure: ENDOSCOPIC RETROGRADE CHOLANGIOPANCREATOGRAPHY (ERCP);  Surgeon: Rogene Houston, MD;  Location: [facility];  Service: Gastroenterology;  Laterality: N/A;   ERCP N/A 02/27/2021   Procedure: ENDOSCOPIC RETROGRADE CHOLANGIOPANCREATOGRAPHY (ERCP);  Surgeon: Rogene Houston, MD;  Location: [facility];  Service: Gastroenterology;  Laterality: N/A;  IR IMAGING GUIDED PORT INSERTION  07/21/2021   KNEE SURGERY Left    x2   LEFT HEART CATH AND CORONARY ANGIOGRAPHY N/A 09/08/2021   Procedure: LEFT HEART CATH AND CORONARY ANGIOGRAPHY;  Surgeon: Early Osmond, MD;  Location: [facility];  Service: Cardiovascular;  Laterality: N/A;   SPHINCTEROTOMY N/A 02/25/2021   Procedure: SPHINCTEROTOMY;  Surgeon: Rogene Houston, MD;  Location: [facility];  Service: Gastroenterology;  Laterality: N/A;   STENT REMOVAL  02/27/2021   Procedure: STENT REMOVAL (8.5Fr x 9cm);  Surgeon: Rogene Houston, MD;  Location: [facility];  Service: Gastroenterology;;    SOCIAL HISTORY:  Social History   Socioeconomic History   Marital status: Married    Spouse name: Not on file   Number of children: Not on file   Years of education: Not on file   Highest education level: Not on file  Occupational History   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  Tobacco Use   Smoking status: Every Day    Packs/day: 0.50    Years: 56.00    Pack years: 28.00    Types: Cigarettes   Smokeless tobacco: Never  Vaping Use   Vaping Use: Never used  Substance and Sexual Activity   Alcohol use: Not Currently    Comment: stopped 2.5 years ago 03/11/21   Drug use: Never   Sexual activity: Not on file  Other Topics Concern   Not on file  Social History Narrative   Not on file   Social Determinants of Health   Financial Resource Strain: Medium Risk   Difficulty of Paying Living Expenses: Somewhat hard  Food Insecurity: No Food Insecurity   Worried About Charity fundraiser in the Last Year: Never true   [place] in the Last Year: Never true  Transportation Needs: No Transportation Needs   Lack of Transportation (Medical): No   Lack of Transportation (Non-Medical): No  Physical Activity: Insufficiently Active   Days of Exercise per Week: 2 days   Minutes of Exercise per Session: 20 min  Stress: No Stress Concern Present   Feeling of Stress : Only a little  Social Connections: Moderately Integrated   Frequency of Communication with Friends and Family: More than three times a week   Frequency of Social Gatherings with Friends and Family: More than three times a week   Attends Religious Services: More than 4 times per year   Active Member of [employer] or Organizations: No   Attends Music therapist: Never   Marital Status: Married  Human resources officer  Violence: Not At Risk   Fear of Current or Ex-Partner: No   Emotionally Abused: No   Physically Abused: No   Sexually Abused: No    FAMILY HISTORY:  Family History  Problem Relation Age of Onset   CAD Mother    Cancer Neg Hx     CURRENT MEDICATIONS:  Current Outpatient Medications  Medication Sig Dispense Refill   acetaminophen (TYLENOL) 500 MG tablet Take 500 mg by mouth every 6 (six) hours as needed for moderate pain or headache.     albuterol (VENTOLIN HFA) 108 (90 Base) MCG/ACT inhaler Inhale 2 puffs into the lungs every 6 (six) hours as needed for wheezing or shortness of breath. 6.7 g 1   aspirin EC 81 MG tablet Take 81 mg by mouth daily. Swallow whole.     atorvastatin (LIPITOR) 40 MG tablet Take 1 tablet (40 mg total) by mouth daily at 6  PM. 90 tablet 3   azithromycin (ZITHROMAX) 500 MG tablet Take 1 tablet (500 mg total) by mouth daily. 4 tablet 0   cefdinir (OMNICEF) 300 MG capsule Take 1 capsule (300 mg total) by mouth every 12 (twelve) hours. 10 capsule 0   CISPLATIN IV Inject into the vein once a week. Days 1, 8 every 21 days     clopidogrel (PLAVIX) 75 MG tablet Take 1 tablet (75 mg total) by mouth daily with breakfast. 90 tablet 3   Durvalumab (IMFINZI IV) Inject into the vein every 21 ( twenty-one) days.     famotidine (PEPCID) 20 MG tablet Take 20 mg by mouth daily.     ferrous sulfate (FERROUSUL) 325 (65 FE) MG tablet Take 1 tablet (325 mg total) by mouth daily with breakfast.     furosemide (LASIX) 20 MG tablet Take 20 mg daily as needed for swelling 90 tablet 1   Gemcitabine HCl (GEMZAR IV) Inject into the vein once a week. Days 1, 8 every 21 days     Ginseng 100 MG CAPS Take 1 capsule by mouth daily.     lidocaine-prilocaine (EMLA) cream Apply a small amount to port a cath site and cover with plastic wrap 1 hour prior to chemotherapy appointments 30 g 3   loratadine (CLARITIN) 10 MG tablet Take 10 mg by mouth daily. Takes for a few days after shot to boost WBC.  About every 2nd treatment     metFORMIN (GLUCOPHAGE) 500 MG tablet Take 1 tablet (500 mg total) by mouth 2 (two) times daily with a meal. 60 tablet 0   midodrine (PROAMATINE) 5 MG tablet Take 1 tablet (5 mg total) by mouth 3 (three) times daily with meals. 270 tablet 3   nitroGLYCERIN (NITROSTAT) 0.4 MG SL tablet Place 1 tablet (0.4 mg total) under the tongue every 5 (five) minutes as needed for chest pain. 25 tablet 3   pantoprazole (PROTONIX) 40 MG tablet Take 1 tablet (40 mg total) by mouth daily. 90 tablet 3   prochlorperazine (COMPAZINE) 10 MG tablet TAKE (1) TABLET BY MOUTH EVERY (6) HOURS AS NEEDED. 60 tablet 0   tamsulosin (FLOMAX) 0.4 MG CAPS capsule TAKE (2) CAPSULES BY MOUTH AT BEDTIME. (Patient taking differently: 0.4 mg at bedtime. If needed can take additional tablet) 60 capsule 2   No current facility-administered medications for this visit.    ALLERGIES:  No Known Allergies  PHYSICAL EXAM:  Performance status (ECOG): 0 - Asymptomatic  There were no vitals filed for this visit. Wt Readings from Last 3 Encounters:  11/29/21 147 lb 14.9 oz (67.1 kg)  11/25/21 155 lb 3.2 oz (70.4 kg)  11/11/21 152 lb 6.4 oz (69.1 kg)   Physical Exam Vitals reviewed.  Constitutional:      Appearance: Normal appearance.  HENT:     Mouth/Throat:     Mouth: No oral lesions.  Cardiovascular:     Rate and Rhythm: Normal rate and regular rhythm.     Pulses: Normal pulses.     Heart sounds: Normal heart sounds.  Pulmonary:     Effort: Pulmonary effort is normal.     Breath sounds: Normal breath sounds.  Abdominal:     Palpations: Abdomen is soft. There is no mass.     Tenderness: There is no abdominal tenderness.  Neurological:     General: No focal deficit present.     Mental Status: He is alert and oriented to person, place, and time.  Psychiatric:  Mood and Affect: Mood normal.        Behavior: Behavior normal.     LABORATORY DATA:  I have reviewed the labs as listed.      Latest Ref Rng & Units 11/29/2021    6:47 AM 11/28/2021    4:33 AM 11/26/2021    9:32 PM  CBC  WBC 4.0 - 10.5 K/uL 5.1   7.6   19.0    Hemoglobin 13.0 - 17.0 g/dL 8.1   7.9   9.0    Hematocrit 39.0 - 52.0 % 25.5   24.8   26.9    Platelets 150 - 400 K/uL 133   147   167        Latest Ref Rng & Units 11/29/2021    6:47 AM 11/28/2021    4:33 AM 11/26/2021    9:32 PM  CMP  Glucose 70 - 99 mg/dL 92   86   145    BUN 8 - 23 mg/dL 32   31   22    Creatinine 0.61 - 1.24 mg/dL 0.94   1.03   1.00    Sodium 135 - 145 mmol/L 137   137   133    Potassium 3.5 - 5.1 mmol/L 4.3   4.1   4.3    Chloride 98 - 111 mmol/L 104   102   104    CO2 22 - 32 mmol/L '27   28   21    '$ Calcium 8.9 - 10.3 mg/dL 8.6   8.5   8.7    Total Protein 6.5 - 8.1 g/dL   6.6    Total Bilirubin 0.3 - 1.2 mg/dL   1.3    Alkaline Phos 38 - 126 U/L   360    AST 15 - 41 U/L   76    ALT 0 - 44 U/L   55      DIAGNOSTIC IMAGING:  I have independently reviewed the scans and discussed with the patient. CT CHEST ABDOMEN PELVIS W CONTRAST  Result Date: 12/18/2021 CLINICAL DATA:  66 year old male with history of cholangiocarcinoma. Follow-up study. EXAM: CT CHEST, ABDOMEN, AND PELVIS WITH CONTRAST TECHNIQUE: Multidetector CT imaging of the chest, abdomen and pelvis was performed following the standard protocol during bolus administration of intravenous contrast. RADIATION DOSE REDUCTION: This exam was performed according to the departmental dose-optimization program which includes automated exposure control, adjustment of the mA and/or kV according to patient size and/or use of iterative reconstruction technique. CONTRAST:  38m OMNIPAQUE IOHEXOL 300 MG/ML  SOLN COMPARISON:  Chest CTA 11/27/2021. CT the chest, abdomen and pelvis 10/09/2021. FINDINGS: CT CHEST FINDINGS Cardiovascular: Heart size is normal. There is no significant pericardial fluid, thickening or pericardial calcification. There is aortic atherosclerosis, as well as  atherosclerosis of the great vessels of the mediastinum and the coronary arteries, including calcified atherosclerotic plaque in the left main, left anterior descending, left circumflex and right coronary arteries. Myocardial thinning and focal dilatation of the inferior and inferolateral wall segments in the base of the left ventricle, compatible with extensive scarring from prior inferior wall myocardial infarction(s). Right internal jugular single-lumen porta cath with tip terminating in the distal superior vena cava. Mediastinum/Nodes: No pathologically enlarged mediastinal or hilar lymph nodes. Esophagus is unremarkable in appearance. No axillary lymphadenopathy. Lungs/Pleura: Tiny 2-4 mm pulmonary nodules in the lungs stable dating back to 03/12/2021, favored to be benign. No larger more suspicious appearing pulmonary nodules or masses are noted. No acute consolidative airspace  disease. No pleural effusions. Scattered areas of septal thickening and thickening of the peribronchovascular interstitium are noted in the lung bases bilaterally, likely reflective of areas of chronic post infectious or inflammatory scarring. Musculoskeletal: There are no aggressive appearing lytic or blastic lesions noted in the visualized portions of the skeleton. CT ABDOMEN PELVIS FINDINGS Hepatobiliary: Numerous embolization coils are noted in the right lobe of the liver resulting in extensive beam hardening artifact which obscures portions of the adjacent hepatic parenchyma, limiting the diagnostic sensitivity and specificity of today's examination. With these limitations in mind, no discrete hepatic mass is confidently identified. There continues to be extensive intrahepatic biliary ductal dilatation which appears grossly similar to the prior examination. Gallbladder is moderately distended. Common bile duct stent again noted, stable in position. Pancreas: No pancreatic mass. No pancreatic ductal dilatation. No pancreatic or  peripancreatic fluid collections or inflammatory changes. Spleen: Unremarkable. Adrenals/Urinary Tract: Bilateral kidneys and adrenal glands are normal in appearance. No hydroureteronephrosis. 11 x 6 mm calculus in the urinary bladder on the right side lying dependently. Urinary bladder is otherwise unremarkable in appearance. Stomach/Bowel: The appearance of the stomach is normal. There is no pathologic dilatation of small bowel or colon. A few scattered colonic diverticulae are noted, particularly in the sigmoid colon, without surrounding inflammatory changes to suggest an acute diverticulitis at this time. Normal appendix. Vascular/Lymphatic: Extensive aortic atherosclerosis, without evidence of aneurysm or dissection noted in the abdominal or pelvic vasculature. No lymphadenopathy noted in the abdomen or pelvis. Reproductive: Median lobe hypertrophy in the prostate gland. Seminal vesicles are unremarkable in appearance. Other: No significant volume of ascites.  No pneumoperitoneum. Musculoskeletal: There are no aggressive appearing lytic or blastic lesions noted in the visualized portions of the skeleton. IMPRESSION: 1. Stable examination demonstrating chronic intrahepatic biliary ductal dilatation and post embolization changes throughout the right lobe of the liver. No discrete hepatic or biliary mass identified on today's examination which is limited by the presence of extensive beam hardening artifact from the embolization coils. 2. Common bile duct stent is stable in position. Degree of intrahepatic biliary ductal dilatation is also stable. 3. No definite signs of metastatic disease confidently identified outside of the liver within the chest, abdomen or pelvis. 4. 11 x 6 mm calculus in the urinary bladder. 5. Aortic atherosclerosis, in addition to left main and three-vessel coronary artery disease. Old inferior wall myocardial infarction, as detailed above. 6. Colonic diverticulosis without evidence of  acute diverticulitis at this time. 7. Additional incidental findings, as above. Electronically Signed   By: Vinnie Langton M.D.   On: 12/18/2021 09:28     ASSESSMENT:  Metastatic cholangiocarcinoma: - 02/2021 initial diagnosis presentation with painless jaundice - ERCP with sphincterotomy, subsequent ERCP with plastic biliary stent placement. - MRI-2 cm mass involving the common hepatic duct with upstream biliary dilatation. - 03/2021 consult at [facility] with GI and surgery-confirm diagnosis of perihilar cholangiocarcinoma - 03/21/2021-biopsy/pathology, ERCP-common hepatic duct cytology and brushings performed.  EUS-1 enlarged lymph node versus tumor implant in the gastrohepatic ligament-negative for malignancy.  Single biliary plastic stent removed and replaced with the plastic stent in the right and left ducts. - 04/17/2021-right portal vein embolization to promote left hepatic hypertrophy. - 07/02/2021-diagnostic laparoscopy with multiple areas concerning for metastatic blocks identified in the abdomen and peritoneal surfaces.  6 separate biopsies obtained. - Pathology consistent with metastatic carcinoma. - 37 pound weight loss in the last 5 months. - Cycle 1 of gemcitabine, cisplatin, durvalumab on 07/22/2021.  2.  Social history/family: -  Lives at home with his wife.  He worked as a Development worker, community. - Current active smoker, 1 pack/day since age 17. - Maternal grandmother had cancer.  Maternal uncle had brain tumor.   PLAN:  Metastatic cholangiocarcinoma: - He was recently hospitalized with pneumonia and was found to have elevated troponin.  He was also in CHF. - Reviewed CT CAP with contrast from 12/16/2021: Stable chronic intrahepatic biliary ductal dilatation with no new changes.  No metastatic disease outside liver.  CBD stent stable. - Reviewed labs today which showed elevated AST and ALT and total bilirubin 1.4 which are stable.  Creatinine is normal.  CBC was grossly normal with  platelet count 96. - For better tolerability, we will switch his treatment to gemcitabine and cisplatin day 1 and day 15 with Durvalumab on day 1 of every 28 days. - Proceed with treatment today.  RTC 2 weeks for follow-up.  2.  Weight loss: - His weight has been stable.  3.  Urinary problems: - Continue Flomax 0.8 mg daily.  4.  Hypotension: - Continue midodrine 5 mg 3 times daily.   Orders placed this encounter:  No orders of the defined types were placed in this encounter.    Derek Jack, MD [facility] [phone]   I, Thana Ates, am acting as a scribe for Dr. Derek Jack.  I, Derek Jack MD, have reviewed the above documentation for accuracy and completeness, and I agree with the above.

## 2021-12-30 NOTE — Progress Notes (Signed)
Patient presents today for Cisplatin/Gemzar per providers order.  Vital signs within parameters for treatment.  Labs pending.    Platelets noted to be 96,  Dr. Delton Coombes notified.  Message received from Anastasio Champion RN/Dr. Delton Coombes okay to proceed with treatment.  Patient has voided 200 cc urine.   Imfinzi/Gemzar/Cisplatin given today per MD orders.  Stable during infusion without adverse affects.  Vital signs stable.  No complaints at this time.  Discharge from clinic ambulatory in stable condition.  Alert and oriented X 3.  Follow up with [facility] as scheduled.

## 2021-12-30 NOTE — Patient Instructions (Signed)
[facility] at [facility] Discharge Instructions   You were seen and examined today by Dr. Delton Coombes.  He reviewed the results of your lab work which is normal/stable.   He reviewed the results of your scans which are stable.   We will proceed with treatment today. We are changing your treatment to every other week. You will no longer need the white blood cell booster.   Return as scheduled in 2 weeks.    Thank you for choosing [facility] to provide your oncology and hematology care.  To afford each patient quality time with our provider, please arrive at least 15 minutes before your scheduled appointment time.   If you have a lab appointment with the [facility] please come in thru the Main Entrance and check in at the main information desk.  You need to re-schedule your appointment should you arrive 10 or more minutes late.  We strive to give you quality time with our providers, and arriving late affects you and other patients whose appointments are after yours.  Also, if you no show three or more times for appointments you may be dismissed from the clinic at the providers discretion.     Again, thank you for choosing [facility].  Our hope is that these requests will decrease the amount of time that you wait before being seen by our physicians.       _____________________________________________________________  Should you have questions after your visit to [facility], please contact our office at [phone] and follow the prompts.  Our office hours are 8:00 a.m. and 4:30 p.m. Monday - Friday.  Please note that voicemails left after 4:00 p.m. may not be returned until the following business day.  We are closed weekends and major holidays.  You do have access to a nurse 24-7, just call the main number to the clinic [phone] and do not press any options, hold on the line and a nurse will answer  the phone.    For prescription refill requests, have your pharmacy contact our office and allow 72 hours.    Due to Covid, you will need to wear a mask upon entering the hospital. If you do not have a mask, a mask will be given to you at the Main Entrance upon arrival. For doctor visits, patients may have 1 support person age 36 or older with them. For treatment visits, patients can not have anyone with them due to social distancing guidelines and our immunocompromised population.

## 2021-12-30 NOTE — Progress Notes (Signed)
Patient has been examined by Dr. Katragadda, and vital signs and labs have been reviewed. ANC, Creatinine, LFTs, hemoglobin, and platelets are within treatment parameters per M.D. - pt may proceed with treatment.    °

## 2021-12-31 ENCOUNTER — Telehealth: Payer: Self-pay | Admitting: Cardiovascular Disease

## 2021-12-31 LAB — CANCER ANTIGEN 19-9: CA 19-9: 323 U/mL — ABNORMAL HIGH (ref 0–35)

## 2021-12-31 NOTE — Telephone Encounter (Signed)
Pt c/o medication issue:  1. Name of Medication: furosemide (LASIX) 20 MG tablet  2. How are you currently taking this medication (dosage and times per day)?   3. Are you having a reaction (difficulty breathing--STAT)?   4. What is your medication issue?  Pt's wife needs advice in regards to this med. States pt was told to take med if pt gained 3 lbs. States pt had gained 4 lbs this morning and she gave him 1 tab and is now requesting info on how to proceed with watching his weight and if she will have to give another pill. Please advise.

## 2021-12-31 NOTE — Telephone Encounter (Signed)
Chronic systolic heart failure (HCC) -Ischemic cardiomyopathy.  No symptoms.  Lasix 20 mg daily given as needed.  He can continue with compression stockings.     I spoke with wife and reassured her that Mr.Magar may use Lasix daily prn. She is going to weigh him tomorrow morning and also encourage him to were compression stockings.

## 2022-01-01 ENCOUNTER — Ambulatory Visit (HOSPITAL_COMMUNITY): Payer: Medicare Other

## 2022-01-06 ENCOUNTER — Encounter (HOSPITAL_COMMUNITY): Payer: Self-pay | Admitting: *Deleted

## 2022-01-06 ENCOUNTER — Telehealth: Payer: Self-pay | Admitting: Cardiovascular Disease

## 2022-01-06 ENCOUNTER — Ambulatory Visit (HOSPITAL_COMMUNITY): Payer: Medicare Other

## 2022-01-06 ENCOUNTER — Other Ambulatory Visit (HOSPITAL_COMMUNITY): Payer: Medicare Other

## 2022-01-06 NOTE — Telephone Encounter (Signed)
Dr. Audie Box to review, the upcoming dental extraction is considered a very low risk procedure, however patient does have severe triple-vessel disease, not a candidate for revascularization given her advanced malignancy.  He is on chemotherapy for metastatic cholangiocarcinoma.  Since he was last seen by Dr. Davina Poke in March, he was admitted in April 2023 with shortness of breath.  Echocardiogram obtained during this admission showed EF is down to 40 to 45% when compared to previous echocardiogram in October 2022.  Serial hs troponin peaked at 4130.   He is on aspirin and Plavix, we typically do not hold blood thinner for 1-2 teeth extraction.  He does not need SBE prophylaxis.  Dr. Audie Box, typically we automatically clears this type of low risk procedure, however given recent hospitalization and high risk comorbidities, would you be okay with him proceeding with dental procedure?  I checked with his dentist office, they plan to use local anesthesia only.

## 2022-01-06 NOTE — Telephone Encounter (Signed)
   Pre-operative Risk Assessment    Patient Name: Jonathan Keith  DOB: 07-12-1956 MRN: [id number]     Request for Surgical Clearance    Procedure:  Dental Extraction - Amount of Teeth to be Pulled:  2 ( # 16 and # 21)  Date of Surgery:  Clearance TBD                                 Surgeon:  Dr. Marcy Salvo Group or Practice Name:  [facility]  Phone number:  [phone] Fax number:  [phone]   Type of Clearance Requested:   - Medical  - Pharmacy:  Meds to be held TBD by Cardiology   Type of Anesthesia:  Not Indicated   Additional requests/questions:  Dentist office needs clearance before procedure can be scheduled   Signed, Johnna Acosta   01/06/2022, 10:30 AM

## 2022-01-07 NOTE — Telephone Encounter (Signed)
   Patient Name: Jonathan Keith  DOB: 1955-08-27 MRN: [id number]  Primary Cardiologist: Evalina Field, MD  Chart reviewed as part of pre-operative protocol coverage.   Simple dental extractions (i.e. 1-2 teeth) are considered low risk procedures per guidelines and generally do not require any specific cardiac clearance. It is also generally accepted that for simple extractions and dental cleanings, there is no need to interrupt blood thinner therapy.  Case discussed with Dr. Audie Box, okay to proceed.  We typically do not recommend holding Plavix for 1-2 teeth extraction, however per Dr. Audie Box, if bleeding risk is excessive, "can hold Plavix if needed".  Typical Plavix holding time is 5 days.  Again, unless absolutely necessary, we do not recommend holding Plavix for 2 teeth extraction given his significant cardiac history.  SBE prophylaxis is not required for the patient from a cardiac standpoint.  I will route this recommendation to the requesting party via Epic fax function and remove from pre-op pool.  Please call with questions.  Almyra Deforest, [place] 01/07/2022, 8:56 AM

## 2022-01-08 ENCOUNTER — Ambulatory Visit (HOSPITAL_COMMUNITY): Payer: Medicare Other

## 2022-01-12 MED FILL — Dexamethasone Sodium Phosphate Inj 100 MG/10ML: INTRAMUSCULAR | Qty: 1 | Status: AC

## 2022-01-12 MED FILL — Fosaprepitant Dimeglumine For IV Infusion 150 MG (Base Eq): INTRAVENOUS | Qty: 5 | Status: AC

## 2022-01-13 ENCOUNTER — Encounter (HOSPITAL_COMMUNITY): Payer: Self-pay | Admitting: Hematology

## 2022-01-13 ENCOUNTER — Inpatient Hospital Stay (HOSPITAL_BASED_OUTPATIENT_CLINIC_OR_DEPARTMENT_OTHER): Payer: Medicare Other | Admitting: Hematology

## 2022-01-13 ENCOUNTER — Inpatient Hospital Stay (HOSPITAL_COMMUNITY): Payer: Medicare Other | Attending: Hematology

## 2022-01-13 ENCOUNTER — Inpatient Hospital Stay (HOSPITAL_COMMUNITY): Payer: Medicare Other

## 2022-01-13 VITALS — BP 137/79 | HR 79 | Temp 98.1°F | Resp 18

## 2022-01-13 VITALS — BP 133/65 | HR 75 | Temp 98.3°F | Resp 18 | Ht 69.0 in | Wt 151.0 lb

## 2022-01-13 DIAGNOSIS — R42 Dizziness and giddiness: Secondary | ICD-10-CM | POA: Diagnosis not present

## 2022-01-13 DIAGNOSIS — N400 Enlarged prostate without lower urinary tract symptoms: Secondary | ICD-10-CM | POA: Insufficient documentation

## 2022-01-13 DIAGNOSIS — N210 Calculus in bladder: Secondary | ICD-10-CM | POA: Diagnosis not present

## 2022-01-13 DIAGNOSIS — R634 Abnormal weight loss: Secondary | ICD-10-CM | POA: Insufficient documentation

## 2022-01-13 DIAGNOSIS — E119 Type 2 diabetes mellitus without complications: Secondary | ICD-10-CM | POA: Insufficient documentation

## 2022-01-13 DIAGNOSIS — Z5112 Encounter for antineoplastic immunotherapy: Secondary | ICD-10-CM | POA: Insufficient documentation

## 2022-01-13 DIAGNOSIS — Z5111 Encounter for antineoplastic chemotherapy: Secondary | ICD-10-CM | POA: Insufficient documentation

## 2022-01-13 DIAGNOSIS — E785 Hyperlipidemia, unspecified: Secondary | ICD-10-CM | POA: Diagnosis not present

## 2022-01-13 DIAGNOSIS — F17210 Nicotine dependence, cigarettes, uncomplicated: Secondary | ICD-10-CM | POA: Diagnosis not present

## 2022-01-13 DIAGNOSIS — C221 Intrahepatic bile duct carcinoma: Secondary | ICD-10-CM

## 2022-01-13 DIAGNOSIS — I110 Hypertensive heart disease with heart failure: Secondary | ICD-10-CM | POA: Insufficient documentation

## 2022-01-13 DIAGNOSIS — R5383 Other fatigue: Secondary | ICD-10-CM | POA: Diagnosis not present

## 2022-01-13 DIAGNOSIS — Z95828 Presence of other vascular implants and grafts: Secondary | ICD-10-CM

## 2022-01-13 DIAGNOSIS — I700 Atherosclerosis of aorta: Secondary | ICD-10-CM | POA: Diagnosis not present

## 2022-01-13 DIAGNOSIS — I509 Heart failure, unspecified: Secondary | ICD-10-CM | POA: Insufficient documentation

## 2022-01-13 DIAGNOSIS — Z79899 Other long term (current) drug therapy: Secondary | ICD-10-CM | POA: Diagnosis not present

## 2022-01-13 DIAGNOSIS — I2510 Atherosclerotic heart disease of native coronary artery without angina pectoris: Secondary | ICD-10-CM | POA: Insufficient documentation

## 2022-01-13 DIAGNOSIS — Z5986 Financial insecurity: Secondary | ICD-10-CM | POA: Diagnosis not present

## 2022-01-13 DIAGNOSIS — G8929 Other chronic pain: Secondary | ICD-10-CM | POA: Diagnosis not present

## 2022-01-13 DIAGNOSIS — I252 Old myocardial infarction: Secondary | ICD-10-CM | POA: Insufficient documentation

## 2022-01-13 DIAGNOSIS — K5730 Diverticulosis of large intestine without perforation or abscess without bleeding: Secondary | ICD-10-CM | POA: Diagnosis not present

## 2022-01-13 LAB — CBC WITH DIFFERENTIAL/PLATELET
Abs Immature Granulocytes: 0.01 10*3/uL (ref 0.00–0.07)
Basophils Absolute: 0 10*3/uL (ref 0.0–0.1)
Basophils Relative: 0 %
Eosinophils Absolute: 0.1 10*3/uL (ref 0.0–0.5)
Eosinophils Relative: 2 %
HCT: 32.1 % — ABNORMAL LOW (ref 39.0–52.0)
Hemoglobin: 10.7 g/dL — ABNORMAL LOW (ref 13.0–17.0)
Immature Granulocytes: 0 %
Lymphocytes Relative: 24 %
Lymphs Abs: 1.1 10*3/uL (ref 0.7–4.0)
MCH: 34.2 pg — ABNORMAL HIGH (ref 26.0–34.0)
MCHC: 33.3 g/dL (ref 30.0–36.0)
MCV: 102.6 fL — ABNORMAL HIGH (ref 80.0–100.0)
Monocytes Absolute: 0.5 10*3/uL (ref 0.1–1.0)
Monocytes Relative: 11 %
Neutro Abs: 2.8 10*3/uL (ref 1.7–7.7)
Neutrophils Relative %: 63 %
Platelets: 85 10*3/uL — ABNORMAL LOW (ref 150–400)
RBC: 3.13 MIL/uL — ABNORMAL LOW (ref 4.22–5.81)
RDW: 13.7 % (ref 11.5–15.5)
WBC: 4.5 10*3/uL (ref 4.0–10.5)
nRBC: 0 % (ref 0.0–0.2)

## 2022-01-13 LAB — COMPREHENSIVE METABOLIC PANEL
ALT: 46 U/L — ABNORMAL HIGH (ref 0–44)
AST: 49 U/L — ABNORMAL HIGH (ref 15–41)
Albumin: 3.1 g/dL — ABNORMAL LOW (ref 3.5–5.0)
Alkaline Phosphatase: 371 U/L — ABNORMAL HIGH (ref 38–126)
Anion gap: 6 (ref 5–15)
BUN: 17 mg/dL (ref 8–23)
CO2: 25 mmol/L (ref 22–32)
Calcium: 9 mg/dL (ref 8.9–10.3)
Chloride: 103 mmol/L (ref 98–111)
Creatinine, Ser: 0.97 mg/dL (ref 0.61–1.24)
GFR, Estimated: >60 mL/min (ref >60)
Glucose, Bld: 163 mg/dL — ABNORMAL HIGH (ref 70–99)
Potassium: 3.9 mmol/L (ref 3.5–5.1)
Sodium: 134 mmol/L — ABNORMAL LOW (ref 135–145)
Total Bilirubin: 1.2 mg/dL (ref 0.3–1.2)
Total Protein: 6.8 g/dL (ref 6.5–8.1)

## 2022-01-13 LAB — MAGNESIUM: Magnesium: 1.8 mg/dL (ref 1.7–2.4)

## 2022-01-13 MED ORDER — SODIUM CHLORIDE 0.9 % IV SOLN: Freq: Once | INTRAVENOUS | Status: AC

## 2022-01-13 MED ORDER — SODIUM CHLORIDE 0.9 % IV SOLN
500.0000 mg/m2 | Freq: Once | INTRAVENOUS | Status: AC
  Administered 2022-01-13: 950 mg via INTRAVENOUS
  Filled 2022-01-13: qty 24.98

## 2022-01-13 MED ORDER — PALONOSETRON HCL INJECTION 0.25 MG/5ML
0.2500 mg | Freq: Once | INTRAVENOUS | Status: AC
  Administered 2022-01-13: 0.25 mg via INTRAVENOUS
  Filled 2022-01-13: qty 5

## 2022-01-13 MED ORDER — SODIUM CHLORIDE 0.9% FLUSH
10.0000 mL | INTRAVENOUS | Status: DC | PRN
  Administered 2022-01-13: 10 mL

## 2022-01-13 MED ORDER — POTASSIUM CHLORIDE IN NACL 20-0.9 MEQ/L-% IV SOLN
Freq: Once | INTRAVENOUS | Status: AC
  Filled 2022-01-13: qty 1000

## 2022-01-13 MED ORDER — MAGNESIUM SULFATE 2 GM/50ML IV SOLN
2.0000 g | Freq: Once | INTRAVENOUS | Status: AC
  Administered 2022-01-13: 2 g via INTRAVENOUS
  Filled 2022-01-13: qty 50

## 2022-01-13 MED ORDER — HEPARIN SOD (PORK) LOCK FLUSH 100 UNIT/ML IV SOLN
500.0000 [IU] | Freq: Once | INTRAVENOUS | Status: AC | PRN
  Administered 2022-01-13: 500 [IU]

## 2022-01-13 MED ORDER — SODIUM CHLORIDE 0.9 % IV SOLN
150.0000 mg | Freq: Once | INTRAVENOUS | Status: AC
  Administered 2022-01-13: 150 mg via INTRAVENOUS
  Filled 2022-01-13: qty 150

## 2022-01-13 MED ORDER — SODIUM CHLORIDE 0.9 % IV SOLN
10.0000 mg | Freq: Once | INTRAVENOUS | Status: AC
  Administered 2022-01-13: 10 mg via INTRAVENOUS
  Filled 2022-01-13: qty 10

## 2022-01-13 MED ORDER — SODIUM CHLORIDE 0.9 % IV SOLN
25.0000 mg/m2 | Freq: Once | INTRAVENOUS | Status: AC
  Administered 2022-01-13: 48 mg via INTRAVENOUS
  Filled 2022-01-13: qty 48

## 2022-01-13 NOTE — Progress Notes (Signed)
[place], [place] [address]   CLINIC:  Medical Oncology/Hematology  PCP:  [name], PA-C [address] / [place] [address] [phone]   REASON FOR VISIT:  Follow-up for intrahepatic cholangiocarcinoma  PRIOR THERAPY: none  NGS Results: not done  CURRENT THERAPY: Cisplatin + Gemcitabine D1,8 q21d  BRIEF ONCOLOGIC HISTORY:  Oncology History  Cholangiocarcinoma ([facility])  02/28/2021 Initial Diagnosis   Cholangiocarcinoma ([facility])    07/17/2021 Cancer Staging   Staging form: Perihilar Bile Ducts, AJCC 8th Edition - Clinical stage from 07/17/2021: Stage IVB (cTX, cNX, pM1) - Signed by Derek Jack, MD on 07/17/2021 Stage prefix: Initial diagnosis    07/22/2021 -  Chemotherapy   Patient is on Treatment Plan : BILIARY TRACT Cisplatin + Gemcitabine D1,8 q21d        CANCER STAGING:  Cancer Staging  Cholangiocarcinoma ([facility]) Staging form: Perihilar Bile Ducts, AJCC 8th Edition - Clinical stage from 07/17/2021: Stage IVB (cTX, cNX, pM1) - Signed by Derek Jack, MD on 07/17/2021   INTERVAL HISTORY:  Jonathan Keith, a 66 y.o. male, returns for routine follow-up and consideration for next cycle of chemotherapy. Giuliano was last seen on 12/30/2021.  Due for day #15 cycle #8 of Cisplatin + Gemcitabine today.   Overall, he tells me he has been feeling pretty well. He has a tooth extracted on 5/31. He denies SOB, and he reports dizziness upon standing. He denies tinging/numbness and tinnitus. His eating has improved. He has gained 5 lbs since his last visit. He denies diarrhea and cough. He denies skin rash, and he reports bruising on his arms.   Overall, he feels ready for next cycle of chemo today.    REVIEW OF SYSTEMS:  Review of Systems  Constitutional:  Positive for fatigue. Negative for appetite change and unexpected weight change.  HENT:   Negative for tinnitus.   Respiratory:  Negative for cough and  shortness of breath.   Gastrointestinal:  Negative for diarrhea.  Skin:  Negative for rash.  Neurological:  Positive for dizziness. Negative for numbness.  Hematological:  Bruises/bleeds easily.  All other systems reviewed and are negative.  PAST MEDICAL/SURGICAL HISTORY:  Past Medical History:  Diagnosis Date   Cancer ([facility])    CHF (congestive heart failure) ([facility])    Chronic pain    neck, back, knees   Class 1 obesity due to excess calories with body mass index (BMI) of 31.0 to 31.9 in adult    Claudication [facility])    Coronary artery disease    DDD (degenerative disc disease), cervical    Diabetes mellitus ([facility])    Dyslipidemia    Hypertension    Port-A-Cath in place 07/20/2021   Tobacco dependence    Past Surgical History:  Procedure Laterality Date   BILIARY BRUSHING N/A 02/25/2021   Procedure: BILIARY BRUSHING;  Surgeon: Rogene Houston, MD;  Location: [facility];  Service: Gastroenterology;  Laterality: N/A;   BILIARY STENT PLACEMENT N/A 02/25/2021   Procedure: BILIARY STENT PLACEMENT;  Surgeon: Rogene Houston, MD;  Location: [facility];  Service: Gastroenterology;  Laterality: N/A;   BILIARY STENT PLACEMENT  02/27/2021   Procedure: BILIARY STENT PLACEMENT (10FR x 9cm) IN THE RIGHT SYSTEM;  Surgeon: Rogene Houston, MD;  Location: [facility];  Service: Gastroenterology;;   BREAST SURGERY Left    benign lump- in his 19s.   CARDIAC CATHETERIZATION     ERCP N/A 02/25/2021   Procedure: ENDOSCOPIC RETROGRADE CHOLANGIOPANCREATOGRAPHY (  ERCP);  Surgeon: Rogene Houston, MD;  Location: [facility];  Service: Gastroenterology;  Laterality: N/A;   ERCP N/A 02/27/2021   Procedure: ENDOSCOPIC RETROGRADE CHOLANGIOPANCREATOGRAPHY (ERCP);  Surgeon: Rogene Houston, MD;  Location: [facility];  Service: Gastroenterology;  Laterality: N/A;   IR IMAGING GUIDED PORT INSERTION  07/21/2021   KNEE SURGERY Left    x2   LEFT HEART CATH AND CORONARY ANGIOGRAPHY N/A 09/08/2021   Procedure: LEFT HEART CATH AND  CORONARY ANGIOGRAPHY;  Surgeon: Early Osmond, MD;  Location: [facility];  Service: Cardiovascular;  Laterality: N/A;   SPHINCTEROTOMY N/A 02/25/2021   Procedure: SPHINCTEROTOMY;  Surgeon: Rogene Houston, MD;  Location: [facility];  Service: Gastroenterology;  Laterality: N/A;   STENT REMOVAL  02/27/2021   Procedure: STENT REMOVAL (8.5Fr x 9cm);  Surgeon: Rogene Houston, MD;  Location: [facility];  Service: Gastroenterology;;    SOCIAL HISTORY:  Social History   Socioeconomic History   Marital status: Married    Spouse name: Not on file   Number of children: Not on file   Years of education: Not on file   Highest education level: Not on file  Occupational History   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  Tobacco Use   Smoking status: Every Day    Packs/day: 0.50    Years: 56.00    Pack years: 28.00    Types: Cigarettes   Smokeless tobacco: Never  Vaping Use   Vaping Use: Never used  Substance and Sexual Activity   Alcohol use: Not Currently    Comment: stopped 2.5 years ago 03/11/21   Drug use: Never   Sexual activity: Not on file  Other Topics Concern   Not on file  Social History Narrative   Not on file   Social Determinants of Health   Financial Resource Strain: Medium Risk   Difficulty of Paying Living Expenses: Somewhat hard  Food Insecurity: No Food Insecurity   Worried About Charity fundraiser in the Last Year: Never true   [place] in the Last Year: Never true  Transportation Needs: No Transportation Needs   Lack of Transportation (Medical): No   Lack of Transportation (Non-Medical): No  Physical Activity: Insufficiently Active   Days of Exercise per Week: 2 days   Minutes of Exercise per Session: 20 min  Stress: No Stress Concern Present   Feeling of Stress : Only a little  Social Connections: Moderately Integrated   Frequency of Communication with Friends and Family: More than three times a week   Frequency of Social Gatherings with Friends  and Family: More than three times a week   Attends Religious Services: More than 4 times per year   Active Member of [employer] or Organizations: No   Attends Music therapist: Never   Marital Status: Married  Human resources officer Violence: Not At Risk   Fear of Current or Ex-Partner: No   Emotionally Abused: No   Physically Abused: No   Sexually Abused: No    FAMILY HISTORY:  Family History  Problem Relation Age of Onset   CAD Mother    Cancer Neg Hx     CURRENT MEDICATIONS:  Current Outpatient Medications  Medication Sig Dispense Refill   acetaminophen (TYLENOL) 500 MG tablet Take 500 mg by mouth every 6 (six) hours as needed for moderate pain or headache.     albuterol (VENTOLIN HFA) 108 (90 Base) MCG/ACT inhaler Inhale 2 puffs into the lungs every 6 (six)  hours as needed for wheezing or shortness of breath. 6.7 g 1   aspirin EC 81 MG tablet Take 81 mg by mouth daily. Swallow whole.     atorvastatin (LIPITOR) 40 MG tablet Take 1 tablet (40 mg total) by mouth daily at 6 PM. 90 tablet 3   CISPLATIN IV Inject into the vein once a week. Days 1, 8 every 21 days     clopidogrel (PLAVIX) 75 MG tablet Take 1 tablet (75 mg total) by mouth daily with breakfast. 90 tablet 3   Durvalumab (IMFINZI IV) Inject into the vein every 21 ( twenty-one) days.     famotidine (PEPCID) 20 MG tablet Take 20 mg by mouth daily.     ferrous sulfate (FERROUSUL) 325 (65 FE) MG tablet Take 1 tablet (325 mg total) by mouth daily with breakfast.     furosemide (LASIX) 20 MG tablet Take 20 mg daily as needed for swelling 90 tablet 1   Gemcitabine HCl (GEMZAR IV) Inject into the vein once a week. Days 1, 8 every 21 days     Ginseng 100 MG CAPS Take 1 capsule by mouth daily.     lidocaine-prilocaine (EMLA) cream Apply a small amount to port a cath site and cover with plastic wrap 1 hour prior to chemotherapy appointments 30 g 3   loratadine (CLARITIN) 10 MG tablet Take 10 mg by mouth daily. Takes for a few days  after shot to boost WBC. About every 2nd treatment     metFORMIN (GLUCOPHAGE) 500 MG tablet Take 1 tablet (500 mg total) by mouth 2 (two) times daily with a meal. 60 tablet 0   midodrine (PROAMATINE) 5 MG tablet Take 1 tablet (5 mg total) by mouth 3 (three) times daily with meals. 270 tablet 3   nitroGLYCERIN (NITROSTAT) 0.4 MG SL tablet Place 1 tablet (0.4 mg total) under the tongue every 5 (five) minutes as needed for chest pain. 25 tablet 3   pantoprazole (PROTONIX) 40 MG tablet Take 1 tablet (40 mg total) by mouth daily. 90 tablet 3   prochlorperazine (COMPAZINE) 10 MG tablet TAKE (1) TABLET BY MOUTH EVERY (6) HOURS AS NEEDED. 60 tablet 0   tamsulosin (FLOMAX) 0.4 MG CAPS capsule TAKE (2) CAPSULES BY MOUTH AT BEDTIME. (Patient taking differently: 0.4 mg at bedtime. If needed can take additional tablet) 60 capsule 2   No current facility-administered medications for this visit.    ALLERGIES:  No Known Allergies  PHYSICAL EXAM:  Performance status (ECOG): 0 - Asymptomatic  Vitals:   01/13/22 0822  BP: 133/65  Pulse: 75  Resp: 18  Temp: 98.3 F (36.8 C)  SpO2: 98%   Wt Readings from Last 3 Encounters:  01/13/22 151 lb (68.5 kg)  01/13/22 151 lb 6.4 oz (68.7 kg)  12/30/21 146 lb 14.4 oz (66.6 kg)   Physical Exam Vitals reviewed.  Constitutional:      Appearance: Normal appearance.  Cardiovascular:     Rate and Rhythm: Normal rate and regular rhythm.     Pulses: Normal pulses.     Heart sounds: Normal heart sounds.  Pulmonary:     Effort: Pulmonary effort is normal.     Breath sounds: Normal breath sounds.  Neurological:     General: No focal deficit present.     Mental Status: He is alert and oriented to person, place, and time.  Psychiatric:        Mood and Affect: Mood normal.        Behavior:  Behavior normal.    LABORATORY DATA:  I have reviewed the labs as listed.     Latest Ref Rng & Units 12/30/2021    7:21 AM 11/29/2021    6:47 AM 11/28/2021    4:33 AM   CBC  WBC 4.0 - 10.5 K/uL 5.4   5.1   7.6    Hemoglobin 13.0 - 17.0 g/dL 11.1   8.1   7.9    Hematocrit 39.0 - 52.0 % 33.7   25.5   24.8    Platelets 150 - 400 K/uL 96   133   147        Latest Ref Rng & Units 12/30/2021    7:21 AM 11/29/2021    6:47 AM 11/28/2021    4:33 AM  CMP  Glucose 70 - 99 mg/dL 170   92   86    BUN 8 - 23 mg/dL 17   32   31    Creatinine 0.61 - 1.24 mg/dL 1.03   0.94   1.03    Sodium 135 - 145 mmol/L 134   137   137    Potassium 3.5 - 5.1 mmol/L 3.8   4.3   4.1    Chloride 98 - 111 mmol/L 105   104   102    CO2 22 - 32 mmol/L '23   27   28    '$ Calcium 8.9 - 10.3 mg/dL 9.1   8.6   8.5    Total Protein 6.5 - 8.1 g/dL 6.8      Total Bilirubin 0.3 - 1.2 mg/dL 1.4      Alkaline Phos 38 - 126 U/L 426      AST 15 - 41 U/L 58      ALT 0 - 44 U/L 49        DIAGNOSTIC IMAGING:  I have independently reviewed the scans and discussed with the patient. CT CHEST ABDOMEN PELVIS W CONTRAST  Result Date: 12/18/2021 CLINICAL DATA:  66 year old male with history of cholangiocarcinoma. Follow-up study. EXAM: CT CHEST, ABDOMEN, AND PELVIS WITH CONTRAST TECHNIQUE: Multidetector CT imaging of the chest, abdomen and pelvis was performed following the standard protocol during bolus administration of intravenous contrast. RADIATION DOSE REDUCTION: This exam was performed according to the departmental dose-optimization program which includes automated exposure control, adjustment of the mA and/or kV according to patient size and/or use of iterative reconstruction technique. CONTRAST:  66m OMNIPAQUE IOHEXOL 300 MG/ML  SOLN COMPARISON:  Chest CTA 11/27/2021. CT the chest, abdomen and pelvis 10/09/2021. FINDINGS: CT CHEST FINDINGS Cardiovascular: Heart size is normal. There is no significant pericardial fluid, thickening or pericardial calcification. There is aortic atherosclerosis, as well as atherosclerosis of the great vessels of the mediastinum and the coronary arteries, including calcified  atherosclerotic plaque in the left main, left anterior descending, left circumflex and right coronary arteries. Myocardial thinning and focal dilatation of the inferior and inferolateral wall segments in the base of the left ventricle, compatible with extensive scarring from prior inferior wall myocardial infarction(s). Right internal jugular single-lumen porta cath with tip terminating in the distal superior vena cava. Mediastinum/Nodes: No pathologically enlarged mediastinal or hilar lymph nodes. Esophagus is unremarkable in appearance. No axillary lymphadenopathy. Lungs/Pleura: Tiny 2-4 mm pulmonary nodules in the lungs stable dating back to 03/12/2021, favored to be benign. No larger more suspicious appearing pulmonary nodules or masses are noted. No acute consolidative airspace disease. No pleural effusions. Scattered areas of septal thickening and thickening of the peribronchovascular  interstitium are noted in the lung bases bilaterally, likely reflective of areas of chronic post infectious or inflammatory scarring. Musculoskeletal: There are no aggressive appearing lytic or blastic lesions noted in the visualized portions of the skeleton. CT ABDOMEN PELVIS FINDINGS Hepatobiliary: Numerous embolization coils are noted in the right lobe of the liver resulting in extensive beam hardening artifact which obscures portions of the adjacent hepatic parenchyma, limiting the diagnostic sensitivity and specificity of today's examination. With these limitations in mind, no discrete hepatic mass is confidently identified. There continues to be extensive intrahepatic biliary ductal dilatation which appears grossly similar to the prior examination. Gallbladder is moderately distended. Common bile duct stent again noted, stable in position. Pancreas: No pancreatic mass. No pancreatic ductal dilatation. No pancreatic or peripancreatic fluid collections or inflammatory changes. Spleen: Unremarkable. Adrenals/Urinary Tract:  Bilateral kidneys and adrenal glands are normal in appearance. No hydroureteronephrosis. 11 x 6 mm calculus in the urinary bladder on the right side lying dependently. Urinary bladder is otherwise unremarkable in appearance. Stomach/Bowel: The appearance of the stomach is normal. There is no pathologic dilatation of small bowel or colon. A few scattered colonic diverticulae are noted, particularly in the sigmoid colon, without surrounding inflammatory changes to suggest an acute diverticulitis at this time. Normal appendix. Vascular/Lymphatic: Extensive aortic atherosclerosis, without evidence of aneurysm or dissection noted in the abdominal or pelvic vasculature. No lymphadenopathy noted in the abdomen or pelvis. Reproductive: Median lobe hypertrophy in the prostate gland. Seminal vesicles are unremarkable in appearance. Other: No significant volume of ascites.  No pneumoperitoneum. Musculoskeletal: There are no aggressive appearing lytic or blastic lesions noted in the visualized portions of the skeleton. IMPRESSION: 1. Stable examination demonstrating chronic intrahepatic biliary ductal dilatation and post embolization changes throughout the right lobe of the liver. No discrete hepatic or biliary mass identified on today's examination which is limited by the presence of extensive beam hardening artifact from the embolization coils. 2. Common bile duct stent is stable in position. Degree of intrahepatic biliary ductal dilatation is also stable. 3. No definite signs of metastatic disease confidently identified outside of the liver within the chest, abdomen or pelvis. 4. 11 x 6 mm calculus in the urinary bladder. 5. Aortic atherosclerosis, in addition to left main and three-vessel coronary artery disease. Old inferior wall myocardial infarction, as detailed above. 6. Colonic diverticulosis without evidence of acute diverticulitis at this time. 7. Additional incidental findings, as above. Electronically Signed   By:  Vinnie Langton M.D.   On: 12/18/2021 09:28     ASSESSMENT:  Metastatic cholangiocarcinoma: - 02/2021 initial diagnosis presentation with painless jaundice - ERCP with sphincterotomy, subsequent ERCP with plastic biliary stent placement. - MRI-2 cm mass involving the common hepatic duct with upstream biliary dilatation. - 03/2021 consult at [facility] with GI and surgery-confirm diagnosis of perihilar cholangiocarcinoma - 03/21/2021-biopsy/pathology, ERCP-common hepatic duct cytology and brushings performed.  EUS-1 enlarged lymph node versus tumor implant in the gastrohepatic ligament-negative for malignancy.  Single biliary plastic stent removed and replaced with the plastic stent in the right and left ducts. - 04/17/2021-right portal vein embolization to promote left hepatic hypertrophy. - 07/02/2021-diagnostic laparoscopy with multiple areas concerning for metastatic blocks identified in the abdomen and peritoneal surfaces.  6 separate biopsies obtained. - Pathology consistent with metastatic carcinoma. - 37 pound weight loss in the last 5 months. - Cycle 1 of gemcitabine, cisplatin, durvalumab on 07/22/2021.  2.  Social history/family: - Lives at home with his wife.  He worked as a Administrator, arts  operator. - Current active smoker, 1 pack/day since age 42. - Maternal grandmother had cancer.  Maternal uncle had brain tumor.   PLAN:  Metastatic cholangiocarcinoma: - CT CAP with contrast (12/16/2021): Stable chronic intrahepatic biliary ductal dilatation with no new changes.  No metastatic disease outside the liver.  CBD stent is stable. - At last visit we have switched his chemotherapy to day 1 and day 15 every 28 days. - He does not report any GI symptoms.  No tingling or numbness or ringing in the ears.  He does report occasional dizziness when he stands up.  He had recent tooth extractions. - Reviewed labs today which showed improvement in AST and ALT which are elevated at 49 and 46.  CBC shows  platelet count 85,000 with normal white count. - CA 19-9 has trended down to 323 from 406 previously. - Proceed with cycle 8-day 15 today.  Continue treatment every 2 weeks.  RTC 4 weeks with repeat tumor marker.  2.  Weight loss: - He has gained 4 pounds since last visit.  He is eating well.  3.  Urinary problems: - Continue Flomax 0.8 mg daily.  4.  Hypotension: - Continue midodrine 5 mg 3 times daily.  Blood pressure is 133/65.   Orders placed this encounter:  No orders of the defined types were placed in this encounter.    Derek Jack, MD [place] [phone]   I, Thana Ates, am acting as a scribe for Dr. Derek Jack.  I, Derek Jack MD, have reviewed the above documentation for accuracy and completeness, and I agree with the above.

## 2022-01-13 NOTE — Patient Instructions (Signed)
[facility]  Discharge Instructions: Thank you for choosing [facility] to provide your oncology and hematology care.  If you have a lab appointment with the [facility], please come in thru the Main Entrance and check in at the main information desk.  Wear comfortable clothing and clothing appropriate for easy access to any Portacath or PICC line.   We strive to give you quality time with your provider. You may need to reschedule your appointment if you arrive late (15 or more minutes).  Arriving late affects you and other patients whose appointments are after yours.  Also, if you miss three or more appointments without notifying the office, you may be dismissed from the clinic at the provider's discretion.      For prescription refill requests, have your pharmacy contact our office and allow 72 hours for refills to be completed.    Today you received the following chemotherapy and/or immunotherapy agents gemzar, cisplatin Cisplatin injection What is this medication? CISPLATIN (SIS pla tin) is a chemotherapy drug. It targets fast dividing cells, like cancer cells, and causes these cells to die. This medicine is used to treat many types of cancer like bladder, ovarian, and testicular cancers. This medicine may be used for other purposes; ask your health care provider or pharmacist if you have questions. COMMON BRAND NAME(S): Platinol, Platinol -AQ What should I tell my care team before I take this medication? They need to know if you have any of these conditions: eye disease, vision problems hearing problems kidney disease low blood counts, like white cells, platelets, or red blood cells tingling of the fingers or toes, or other nerve disorder an unusual or allergic reaction to cisplatin, carboplatin, oxaliplatin, other medicines, foods, dyes, or preservatives pregnant or trying to get pregnant breast-feeding How should I use this medication? This drug is given  as an infusion into a vein. It is administered in a hospital or clinic by a specially trained health care professional. Talk to your pediatrician regarding the use of this medicine in children. Special care may be needed. Overdosage: If you think you have taken too much of this medicine contact a poison control center or emergency room at once. NOTE: This medicine is only for you. Do not share this medicine with others. What if I miss a dose? It is important not to miss a dose. Call your doctor or health care professional if you are unable to keep an appointment. What may interact with this medication? This medicine may interact with the following medications: foscarnet certain antibiotics like amikacin, gentamicin, neomycin, polymyxin B, streptomycin, tobramycin, vancomycin This list may not describe all possible interactions. Give your health care provider a list of all the medicines, herbs, non-prescription drugs, or dietary supplements you use. Also tell them if you smoke, drink alcohol, or use illegal drugs. Some items may interact with your medicine. What should I watch for while using this medication? Your condition will be monitored carefully while you are receiving this medicine. You will need important blood work done while you are taking this medicine. This drug may make you feel generally unwell. This is not uncommon, as chemotherapy can affect healthy cells as well as cancer cells. Report any side effects. Continue your course of treatment even though you feel ill unless your doctor tells you to stop. This medicine may increase your risk of getting an infection. Call your healthcare professional for advice if you get a fever, chills, or sore throat, or other  symptoms of a cold or flu. Do not treat yourself. Try to avoid being around people who are sick. Avoid taking medicines that contain aspirin, acetaminophen, ibuprofen, naproxen, or ketoprofen unless instructed by your healthcare  professional. These medicines may hide a fever. This medicine may increase your risk to bruise or bleed. Call your doctor or health care professional if you notice any unusual bleeding. Be careful brushing and flossing your teeth or using a toothpick because you may get an infection or bleed more easily. If you have any dental work done, tell your dentist you are receiving this medicine. Do not become pregnant while taking this medicine or for 14 months after stopping it. Women should inform their healthcare professional if they wish to become pregnant or think they might be pregnant. Men should not father a child while taking this medicine and for 11 months after stopping it. There is potential for serious side effects to an unborn child. Talk to your healthcare professional for more information. Do not breast-feed an infant while taking this medicine. This medicine has caused ovarian failure in some women. This medicine may make it more difficult to get pregnant. Talk to your healthcare professional if you are concerned about your fertility. This medicine has caused decreased sperm counts in some men. This may make it more difficult to father a child. Talk to your healthcare professional if you are concerned about your fertility. Drink fluids as directed while you are taking this medicine. This will help protect your kidneys. Call your doctor or health care professional if you get diarrhea. Do not treat yourself. What side effects may I notice from receiving this medication? Side effects that you should report to your doctor or health care professional as soon as possible: allergic reactions like skin rash, itching or hives, swelling of the face, lips, or tongue blurred vision changes in vision decreased hearing or ringing of the ears nausea, vomiting pain, redness, or irritation at site where injected pain, tingling, numbness in the hands or feet signs and symptoms of bleeding such as bloody or  black, tarry stools; red or dark brown urine; spitting up blood or brown material that looks like coffee grounds; red spots on the skin; unusual bruising or bleeding from the eyes, gums, or nose signs and symptoms of infection like fever; chills; cough; sore throat; pain or trouble passing urine signs and symptoms of kidney injury like trouble passing urine or change in the amount of urine signs and symptoms of low red blood cells or anemia such as unusually weak or tired; feeling faint or lightheaded; falls; breathing problems Side effects that usually do not require medical attention (report to your doctor or health care professional if they continue or are bothersome): loss of appetite mouth sores muscle cramps This list may not describe all possible side effects. Call your doctor for medical advice about side effects. You may report side effects to FDA at 1-800-FDA-1088. Where should I keep my medication? This drug is given in a hospital or clinic and will not be stored at home. NOTE: This sheet is a summary. It may not cover all possible information. If you have questions about this medicine, talk to your doctor, pharmacist, or health care provider.  2023 Elsevier/Gold Standard (2021-06-27 00:00:00) Gemcitabine injection What is this medication? GEMCITABINE (jem SYE ta been) is a chemotherapy drug. This medicine is used to treat many types of cancer like breast cancer, lung cancer, pancreatic cancer, and ovarian cancer. This medicine may be used  for other purposes; ask your health care provider or pharmacist if you have questions. COMMON BRAND NAME(S): Gemzar, Infugem What should I tell my care team before I take this medication? They need to know if you have any of these conditions: blood disorders infection kidney disease liver disease lung or breathing disease, like asthma recent or ongoing radiation therapy an unusual or allergic reaction to gemcitabine, other chemotherapy, other  medicines, foods, dyes, or preservatives pregnant or trying to get pregnant breast-feeding How should I use this medication? This drug is given as an infusion into a vein. It is administered in a hospital or clinic by a specially trained health care professional. Talk to your pediatrician regarding the use of this medicine in children. Special care may be needed. Overdosage: If you think you have taken too much of this medicine contact a poison control center or emergency room at once. NOTE: This medicine is only for you. Do not share this medicine with others. What if I miss a dose? It is important not to miss your dose. Call your doctor or health care professional if you are unable to keep an appointment. What may interact with this medication? medicines to increase blood counts like filgrastim, pegfilgrastim, sargramostim some other chemotherapy drugs like cisplatin vaccines Talk to your doctor or health care professional before taking any of these medicines: acetaminophen aspirin ibuprofen ketoprofen naproxen This list may not describe all possible interactions. Give your health care provider a list of all the medicines, herbs, non-prescription drugs, or dietary supplements you use. Also tell them if you smoke, drink alcohol, or use illegal drugs. Some items may interact with your medicine. What should I watch for while using this medication? Visit your doctor for checks on your progress. This drug may make you feel generally unwell. This is not uncommon, as chemotherapy can affect healthy cells as well as cancer cells. Report any side effects. Continue your course of treatment even though you feel ill unless your doctor tells you to stop. In some cases, you may be given additional medicines to help with side effects. Follow all directions for their use. Call your doctor or health care professional for advice if you get a fever, chills or sore throat, or other symptoms of a cold or flu.  Do not treat yourself. This drug decreases your body's ability to fight infections. Try to avoid being around people who are sick. This medicine may increase your risk to bruise or bleed. Call your doctor or health care professional if you notice any unusual bleeding. Be careful brushing and flossing your teeth or using a toothpick because you may get an infection or bleed more easily. If you have any dental work done, tell your dentist you are receiving this medicine. Avoid taking products that contain aspirin, acetaminophen, ibuprofen, naproxen, or ketoprofen unless instructed by your doctor. These medicines may hide a fever. Do not become pregnant while taking this medicine or for 6 months after stopping it. Women should inform their doctor if they wish to become pregnant or think they might be pregnant. Men should not father a child while taking this medicine and for 3 months after stopping it. There is a potential for serious side effects to an unborn child. Talk to your health care professional or pharmacist for more information. Do not breast-feed an infant while taking this medicine or for at least 1 week after stopping it. Men should inform their doctors if they wish to father a child. This medicine may lower  sperm counts. Talk with your doctor or health care professional if you are concerned about your fertility. What side effects may I notice from receiving this medication? Side effects that you should report to your doctor or health care professional as soon as possible: allergic reactions like skin rash, itching or hives, swelling of the face, lips, or tongue breathing problems pain, redness, or irritation at site where injected signs and symptoms of a dangerous change in heartbeat or heart rhythm like chest pain; dizziness; fast or irregular heartbeat; palpitations; feeling faint or lightheaded, falls; breathing problems signs of decreased platelets or bleeding - bruising, pinpoint red  spots on the skin, black, tarry stools, blood in the urine signs of decreased red blood cells - unusually weak or tired, feeling faint or lightheaded, falls signs of infection - fever or chills, cough, sore throat, pain or difficulty passing urine signs and symptoms of kidney injury like trouble passing urine or change in the amount of urine signs and symptoms of liver injury like dark yellow or brown urine; general ill feeling or flu-like symptoms; light-colored stools; loss of appetite; nausea; right upper belly pain; unusually weak or tired; yellowing of the eyes or skin swelling of ankles, feet, hands Side effects that usually do not require medical attention (report to your doctor or health care professional if they continue or are bothersome): constipation diarrhea hair loss loss of appetite nausea rash vomiting This list may not describe all possible side effects. Call your doctor for medical advice about side effects. You may report side effects to FDA at 1-800-FDA-1088. Where should I keep my medication? This drug is given in a hospital or clinic and will not be stored at home. NOTE: This sheet is a summary. It may not cover all possible information. If you have questions about this medicine, talk to your doctor, pharmacist, or health care provider.  2023 Elsevier/Gold Standard (2017-10-20 00:00:00)       To help prevent nausea and vomiting after your treatment, we encourage you to take your nausea medication as directed.  BELOW ARE SYMPTOMS THAT SHOULD BE REPORTED IMMEDIATELY: *FEVER GREATER THAN 100.4 F (38 C) OR HIGHER *CHILLS OR SWEATING *NAUSEA AND VOMITING THAT IS NOT CONTROLLED WITH YOUR NAUSEA MEDICATION *UNUSUAL SHORTNESS OF BREATH *UNUSUAL BRUISING OR BLEEDING *URINARY PROBLEMS (pain or burning when urinating, or frequent urination) *BOWEL PROBLEMS (unusual diarrhea, constipation, pain near the anus) TENDERNESS IN MOUTH AND THROAT WITH OR WITHOUT PRESENCE OF ULCERS  (sore throat, sores in mouth, or a toothache) UNUSUAL RASH, SWELLING OR PAIN  UNUSUAL VAGINAL DISCHARGE OR ITCHING   Items with * indicate a potential emergency and should be followed up as soon as possible or go to the Emergency Department if any problems should occur.  Please show the CHEMOTHERAPY ALERT CARD or IMMUNOTHERAPY ALERT CARD at check-in to the Emergency Department and triage nurse.  Should you have questions after your visit or need to cancel or reschedule your appointment, please contact [facility] [phone]  and follow the prompts.  Office hours are 8:00 a.m. to 4:30 p.m. Monday - Friday. Please note that voicemails left after 4:00 p.m. may not be returned until the following business day.  We are closed weekends and major holidays. You have access to a nurse at all times for urgent questions. Please call the main number to the clinic [phone] and follow the prompts.  For any non-urgent questions, you may also contact your provider using MyChart. We now offer e-Visits for anyone  18 and older to request care online for non-urgent symptoms. For details visit [email or web address].   Also download the MyChart app! Go to the app store, search "MyChart", open the app, select [facility], and log in with your MyChart username and password.  Due to Covid, a mask is required upon entering the hospital/clinic. If you do not have a mask, one will be given to you upon arrival. For doctor visits, patients may have 1 support person aged 95 or older with them. For treatment visits, patients cannot have anyone with them due to current Covid guidelines and our immunocompromised population.

## 2022-01-13 NOTE — Progress Notes (Signed)
Platelets 85 today with verbal order ok to treat Dr. Delton Coombes.   Patient tolerated chemotherapy with no complaints voiced.  Side effects with management reviewed with understanding verbalized.  Port site clean and dry with no bruising or swelling noted at site.  Good blood return noted before and after administration of chemotherapy.  Band aid applied.  Patient left in satisfactory condition with VSS and no s/s of distress noted.

## 2022-01-13 NOTE — Patient Instructions (Addendum)
[facility] at [facility] Discharge Instructions   You were seen and examined today by Dr. Delton Coombes.  He reviewed your lab work which is normal/stable.  We will proceed with your treatment today and every 2 weeks.   Return as scheduled.    Thank you for choosing [facility] at [facility] to provide your oncology and hematology care.  To afford each patient quality time with our provider, please arrive at least 15 minutes before your scheduled appointment time.   If you have a lab appointment with the [facility] please come in thru the Main Entrance and check in at the main information desk.  You need to re-schedule your appointment should you arrive 10 or more minutes late.  We strive to give you quality time with our providers, and arriving late affects you and other patients whose appointments are after yours.  Also, if you no show three or more times for appointments you may be dismissed from the clinic at the providers discretion.     Again, thank you for choosing [facility].  Our hope is that these requests will decrease the amount of time that you wait before being seen by our physicians.       _____________________________________________________________  Should you have questions after your visit to [facility], please contact our office at [phone] and follow the prompts.  Our office hours are 8:00 a.m. and 4:30 p.m. Monday - Friday.  Please note that voicemails left after 4:00 p.m. may not be returned until the following business day.  We are closed weekends and major holidays.  You do have access to a nurse 24-7, just call the main number to the clinic [phone] and do not press any options, hold on the line and a nurse will answer the phone.    For prescription refill requests, have your pharmacy contact our office and allow 72 hours.    Due to Covid, you will need to wear a mask upon entering  the hospital. If you do not have a mask, a mask will be given to you at the Main Entrance upon arrival. For doctor visits, patients may have 1 support person age 66 or older with them. For treatment visits, patients can not have anyone with them due to social distancing guidelines and our immunocompromised population.

## 2022-01-13 NOTE — Progress Notes (Signed)
Chaplain engaged in a follow-up visit with Damante who voiced he was doing well.  Chaplain continues to offer presence and support.    01/13/22 1100  Clinical Encounter Type  Visited With Patient  Visit Type Follow-up

## 2022-01-21 ENCOUNTER — Other Ambulatory Visit (HOSPITAL_COMMUNITY): Payer: Self-pay | Admitting: Hematology

## 2022-01-21 DIAGNOSIS — C221 Intrahepatic bile duct carcinoma: Secondary | ICD-10-CM

## 2022-01-21 DIAGNOSIS — Z95828 Presence of other vascular implants and grafts: Secondary | ICD-10-CM

## 2022-01-27 ENCOUNTER — Encounter (HOSPITAL_COMMUNITY): Payer: Self-pay

## 2022-01-27 ENCOUNTER — Telehealth: Payer: Self-pay | Admitting: Cardiovascular Disease

## 2022-01-27 ENCOUNTER — Inpatient Hospital Stay (HOSPITAL_COMMUNITY): Payer: Medicare Other

## 2022-01-27 ENCOUNTER — Inpatient Hospital Stay (HOSPITAL_COMMUNITY): Payer: Medicare Other | Admitting: Hematology

## 2022-01-27 VITALS — BP 135/74 | HR 78 | Temp 96.7°F | Resp 18

## 2022-01-27 DIAGNOSIS — C221 Intrahepatic bile duct carcinoma: Secondary | ICD-10-CM

## 2022-01-27 DIAGNOSIS — Z95828 Presence of other vascular implants and grafts: Secondary | ICD-10-CM

## 2022-01-27 DIAGNOSIS — Z5112 Encounter for antineoplastic immunotherapy: Secondary | ICD-10-CM | POA: Diagnosis not present

## 2022-01-27 LAB — COMPREHENSIVE METABOLIC PANEL
ALT: 43 U/L (ref 0–44)
AST: 48 U/L — ABNORMAL HIGH (ref 15–41)
Albumin: 3.2 g/dL — ABNORMAL LOW (ref 3.5–5.0)
Alkaline Phosphatase: 298 U/L — ABNORMAL HIGH (ref 38–126)
Anion gap: 6 (ref 5–15)
BUN: 16 mg/dL (ref 8–23)
CO2: 24 mmol/L (ref 22–32)
Calcium: 9 mg/dL (ref 8.9–10.3)
Chloride: 105 mmol/L (ref 98–111)
Creatinine, Ser: 1.05 mg/dL (ref 0.61–1.24)
GFR, Estimated: >60 mL/min (ref >60)
Glucose, Bld: 169 mg/dL — ABNORMAL HIGH (ref 70–99)
Potassium: 4.2 mmol/L (ref 3.5–5.1)
Sodium: 135 mmol/L (ref 135–145)
Total Bilirubin: 1.1 mg/dL (ref 0.3–1.2)
Total Protein: 6.7 g/dL (ref 6.5–8.1)

## 2022-01-27 LAB — CBC WITH DIFFERENTIAL/PLATELET
Abs Immature Granulocytes: 0.01 10*3/uL (ref 0.00–0.07)
Basophils Absolute: 0 10*3/uL (ref 0.0–0.1)
Basophils Relative: 1 %
Eosinophils Absolute: 0.1 10*3/uL (ref 0.0–0.5)
Eosinophils Relative: 1 %
HCT: 33.3 % — ABNORMAL LOW (ref 39.0–52.0)
Hemoglobin: 11.2 g/dL — ABNORMAL LOW (ref 13.0–17.0)
Immature Granulocytes: 0 %
Lymphocytes Relative: 23 %
Lymphs Abs: 1 10*3/uL (ref 0.7–4.0)
MCH: 34 pg (ref 26.0–34.0)
MCHC: 33.6 g/dL (ref 30.0–36.0)
MCV: 101.2 fL — ABNORMAL HIGH (ref 80.0–100.0)
Monocytes Absolute: 0.6 10*3/uL (ref 0.1–1.0)
Monocytes Relative: 14 %
Neutro Abs: 2.6 10*3/uL (ref 1.7–7.7)
Neutrophils Relative %: 61 %
Platelets: 88 10*3/uL — ABNORMAL LOW (ref 150–400)
RBC: 3.29 MIL/uL — ABNORMAL LOW (ref 4.22–5.81)
RDW: 14.2 % (ref 11.5–15.5)
WBC: 4.3 10*3/uL (ref 4.0–10.5)
nRBC: 0 % (ref 0.0–0.2)

## 2022-01-27 LAB — MAGNESIUM: Magnesium: 1.6 mg/dL — ABNORMAL LOW (ref 1.7–2.4)

## 2022-01-27 MED ORDER — SODIUM CHLORIDE 0.9 % IV SOLN
25.0000 mg/m2 | Freq: Once | INTRAVENOUS | Status: AC
  Administered 2022-01-27: 48 mg via INTRAVENOUS
  Filled 2022-01-27: qty 48

## 2022-01-27 MED ORDER — POTASSIUM CHLORIDE IN NACL 20-0.9 MEQ/L-% IV SOLN
Freq: Once | INTRAVENOUS | Status: AC
  Filled 2022-01-27: qty 1000

## 2022-01-27 MED ORDER — SODIUM CHLORIDE 0.9 % IV SOLN
500.0000 mg/m2 | Freq: Once | INTRAVENOUS | Status: AC
  Administered 2022-01-27: 950 mg via INTRAVENOUS
  Filled 2022-01-27: qty 24.98

## 2022-01-27 MED ORDER — PALONOSETRON HCL INJECTION 0.25 MG/5ML
0.2500 mg | Freq: Once | INTRAVENOUS | Status: AC
  Administered 2022-01-27: 0.25 mg via INTRAVENOUS
  Filled 2022-01-27: qty 5

## 2022-01-27 MED ORDER — SODIUM CHLORIDE 0.9 % IV SOLN
150.0000 mg | Freq: Once | INTRAVENOUS | Status: AC
  Administered 2022-01-27: 150 mg via INTRAVENOUS
  Filled 2022-01-27: qty 150

## 2022-01-27 MED ORDER — SODIUM CHLORIDE 0.9 % IV SOLN: Freq: Once | INTRAVENOUS | Status: AC

## 2022-01-27 MED ORDER — HEPARIN SOD (PORK) LOCK FLUSH 100 UNIT/ML IV SOLN
500.0000 [IU] | Freq: Once | INTRAVENOUS | Status: AC | PRN
  Administered 2022-01-27: 500 [IU]

## 2022-01-27 MED ORDER — SODIUM CHLORIDE 0.9 % IV SOLN
1500.0000 mg | Freq: Once | INTRAVENOUS | Status: AC
  Administered 2022-01-27: 1500 mg via INTRAVENOUS
  Filled 2022-01-27: qty 30

## 2022-01-27 MED ORDER — SODIUM CHLORIDE 0.9 % IV SOLN
10.0000 mg | Freq: Once | INTRAVENOUS | Status: AC
  Administered 2022-01-27: 10 mg via INTRAVENOUS
  Filled 2022-01-27: qty 10

## 2022-01-27 MED ORDER — SODIUM CHLORIDE 0.9% FLUSH
10.0000 mL | INTRAVENOUS | Status: DC | PRN
  Administered 2022-01-27: 10 mL

## 2022-01-27 MED ORDER — MAGNESIUM SULFATE 2 GM/50ML IV SOLN
2.0000 g | Freq: Once | INTRAVENOUS | Status: AC
  Administered 2022-01-27: 2 g via INTRAVENOUS
  Filled 2022-01-27: qty 50

## 2022-01-27 NOTE — Telephone Encounter (Signed)
Pt is returning a call about medication

## 2022-01-27 NOTE — Telephone Encounter (Signed)
Please hang up and try again later.

## 2022-01-27 NOTE — Telephone Encounter (Signed)
Pt c/o medication issue:  1. Name of Medication: furosemide (LASIX) 20 MG tablet  2. How are you currently taking this medication (dosage and times per day)? Take 20 mg daily as needed for swelling  3. Are you having a reaction (difficulty breathing--STAT)? no  4. What is your medication issue? Wife calling to see if he can take the pill even though he had chemo today. She states patient has gain 7lbs. Please advise

## 2022-01-27 NOTE — Patient Instructions (Signed)
[facility]  Discharge Instructions: Thank you for choosing [facility] to provide your oncology and hematology care.  If you have a lab appointment with the [facility], please come in thru the Main Entrance and check in at the main information desk.  Wear comfortable clothing and clothing appropriate for easy access to any Portacath or PICC line.   We strive to give you quality time with your provider. You may need to reschedule your appointment if you arrive late (15 or more minutes).  Arriving late affects you and other patients whose appointments are after yours.  Also, if you miss three or more appointments without notifying the office, you may be dismissed from the clinic at the provider's discretion.      For prescription refill requests, have your pharmacy contact our office and allow 72 hours for refills to be completed.    Today you received the following chemotherapy and/or immunotherapy agents imfinzi, gemzar, cisplatin.  Cisplatin injection What is this medication? CISPLATIN (SIS pla tin) is a chemotherapy drug. It targets fast dividing cells, like cancer cells, and causes these cells to die. This medicine is used to treat many types of cancer like bladder, ovarian, and testicular cancers. This medicine may be used for other purposes; ask your health care provider or pharmacist if you have questions. COMMON BRAND NAME(S): Platinol, Platinol -AQ What should I tell my care team before I take this medication? They need to know if you have any of these conditions: eye disease, vision problems hearing problems kidney disease low blood counts, like white cells, platelets, or red blood cells tingling of the fingers or toes, or other nerve disorder an unusual or allergic reaction to cisplatin, carboplatin, oxaliplatin, other medicines, foods, dyes, or preservatives pregnant or trying to get pregnant breast-feeding How should I use this medication? This  drug is given as an infusion into a vein. It is administered in a hospital or clinic by a specially trained health care professional. Talk to your pediatrician regarding the use of this medicine in children. Special care may be needed. Overdosage: If you think you have taken too much of this medicine contact a poison control center or emergency room at once. NOTE: This medicine is only for you. Do not share this medicine with others. What if I miss a dose? It is important not to miss a dose. Call your doctor or health care professional if you are unable to keep an appointment. What may interact with this medication? This medicine may interact with the following medications: foscarnet certain antibiotics like amikacin, gentamicin, neomycin, polymyxin B, streptomycin, tobramycin, vancomycin This list may not describe all possible interactions. Give your health care provider a list of all the medicines, herbs, non-prescription drugs, or dietary supplements you use. Also tell them if you smoke, drink alcohol, or use illegal drugs. Some items may interact with your medicine. What should I watch for while using this medication? Your condition will be monitored carefully while you are receiving this medicine. You will need important blood work done while you are taking this medicine. This drug may make you feel generally unwell. This is not uncommon, as chemotherapy can affect healthy cells as well as cancer cells. Report any side effects. Continue your course of treatment even though you feel ill unless your doctor tells you to stop. This medicine may increase your risk of getting an infection. Call your healthcare professional for advice if you get a fever, chills, or sore throat,  or other symptoms of a cold or flu. Do not treat yourself. Try to avoid being around people who are sick. Avoid taking medicines that contain aspirin, acetaminophen, ibuprofen, naproxen, or ketoprofen unless instructed by your  healthcare professional. These medicines may hide a fever. This medicine may increase your risk to bruise or bleed. Call your doctor or health care professional if you notice any unusual bleeding. Be careful brushing and flossing your teeth or using a toothpick because you may get an infection or bleed more easily. If you have any dental work done, tell your dentist you are receiving this medicine. Do not become pregnant while taking this medicine or for 14 months after stopping it. Women should inform their healthcare professional if they wish to become pregnant or think they might be pregnant. Men should not father a child while taking this medicine and for 11 months after stopping it. There is potential for serious side effects to an unborn child. Talk to your healthcare professional for more information. Do not breast-feed an infant while taking this medicine. This medicine has caused ovarian failure in some women. This medicine may make it more difficult to get pregnant. Talk to your healthcare professional if you are concerned about your fertility. This medicine has caused decreased sperm counts in some men. This may make it more difficult to father a child. Talk to your healthcare professional if you are concerned about your fertility. Drink fluids as directed while you are taking this medicine. This will help protect your kidneys. Call your doctor or health care professional if you get diarrhea. Do not treat yourself. What side effects may I notice from receiving this medication? Side effects that you should report to your doctor or health care professional as soon as possible: allergic reactions like skin rash, itching or hives, swelling of the face, lips, or tongue blurred vision changes in vision decreased hearing or ringing of the ears nausea, vomiting pain, redness, or irritation at site where injected pain, tingling, numbness in the hands or feet signs and symptoms of bleeding such as  bloody or black, tarry stools; red or dark brown urine; spitting up blood or brown material that looks like coffee grounds; red spots on the skin; unusual bruising or bleeding from the eyes, gums, or nose signs and symptoms of infection like fever; chills; cough; sore throat; pain or trouble passing urine signs and symptoms of kidney injury like trouble passing urine or change in the amount of urine signs and symptoms of low red blood cells or anemia such as unusually weak or tired; feeling faint or lightheaded; falls; breathing problems Side effects that usually do not require medical attention (report to your doctor or health care professional if they continue or are bothersome): loss of appetite mouth sores muscle cramps This list may not describe all possible side effects. Call your doctor for medical advice about side effects. You may report side effects to FDA at 1-800-FDA-1088. Where should I keep my medication? This drug is given in a hospital or clinic and will not be stored at home. NOTE: This sheet is a summary. It may not cover all possible information. If you have questions about this medicine, talk to your doctor, pharmacist, or health care provider.  2023 Elsevier/Gold Standard (2021-06-27 00:00:00) Gemcitabine injection What is this medication? GEMCITABINE (jem SYE ta been) is a chemotherapy drug. This medicine is used to treat many types of cancer like breast cancer, lung cancer, pancreatic cancer, and ovarian cancer. This medicine may  be used for other purposes; ask your health care provider or pharmacist if you have questions. COMMON BRAND NAME(S): Gemzar, Infugem What should I tell my care team before I take this medication? They need to know if you have any of these conditions: blood disorders infection kidney disease liver disease lung or breathing disease, like asthma recent or ongoing radiation therapy an unusual or allergic reaction to gemcitabine, other  chemotherapy, other medicines, foods, dyes, or preservatives pregnant or trying to get pregnant breast-feeding How should I use this medication? This drug is given as an infusion into a vein. It is administered in a hospital or clinic by a specially trained health care professional. Talk to your pediatrician regarding the use of this medicine in children. Special care may be needed. Overdosage: If you think you have taken too much of this medicine contact a poison control center or emergency room at once. NOTE: This medicine is only for you. Do not share this medicine with others. What if I miss a dose? It is important not to miss your dose. Call your doctor or health care professional if you are unable to keep an appointment. What may interact with this medication? medicines to increase blood counts like filgrastim, pegfilgrastim, sargramostim some other chemotherapy drugs like cisplatin vaccines Talk to your doctor or health care professional before taking any of these medicines: acetaminophen aspirin ibuprofen ketoprofen naproxen This list may not describe all possible interactions. Give your health care provider a list of all the medicines, herbs, non-prescription drugs, or dietary supplements you use. Also tell them if you smoke, drink alcohol, or use illegal drugs. Some items may interact with your medicine. What should I watch for while using this medication? Visit your doctor for checks on your progress. This drug may make you feel generally unwell. This is not uncommon, as chemotherapy can affect healthy cells as well as cancer cells. Report any side effects. Continue your course of treatment even though you feel ill unless your doctor tells you to stop. In some cases, you may be given additional medicines to help with side effects. Follow all directions for their use. Call your doctor or health care professional for advice if you get a fever, chills or sore throat, or other symptoms  of a cold or flu. Do not treat yourself. This drug decreases your body's ability to fight infections. Try to avoid being around people who are sick. This medicine may increase your risk to bruise or bleed. Call your doctor or health care professional if you notice any unusual bleeding. Be careful brushing and flossing your teeth or using a toothpick because you may get an infection or bleed more easily. If you have any dental work done, tell your dentist you are receiving this medicine. Avoid taking products that contain aspirin, acetaminophen, ibuprofen, naproxen, or ketoprofen unless instructed by your doctor. These medicines may hide a fever. Do not become pregnant while taking this medicine or for 6 months after stopping it. Women should inform their doctor if they wish to become pregnant or think they might be pregnant. Men should not father a child while taking this medicine and for 3 months after stopping it. There is a potential for serious side effects to an unborn child. Talk to your health care professional or pharmacist for more information. Do not breast-feed an infant while taking this medicine or for at least 1 week after stopping it. Men should inform their doctors if they wish to father a child. This medicine  may lower sperm counts. Talk with your doctor or health care professional if you are concerned about your fertility. What side effects may I notice from receiving this medication? Side effects that you should report to your doctor or health care professional as soon as possible: allergic reactions like skin rash, itching or hives, swelling of the face, lips, or tongue breathing problems pain, redness, or irritation at site where injected signs and symptoms of a dangerous change in heartbeat or heart rhythm like chest pain; dizziness; fast or irregular heartbeat; palpitations; feeling faint or lightheaded, falls; breathing problems signs of decreased platelets or bleeding - bruising,  pinpoint red spots on the skin, black, tarry stools, blood in the urine signs of decreased red blood cells - unusually weak or tired, feeling faint or lightheaded, falls signs of infection - fever or chills, cough, sore throat, pain or difficulty passing urine signs and symptoms of kidney injury like trouble passing urine or change in the amount of urine signs and symptoms of liver injury like dark yellow or brown urine; general ill feeling or flu-like symptoms; light-colored stools; loss of appetite; nausea; right upper belly pain; unusually weak or tired; yellowing of the eyes or skin swelling of ankles, feet, hands Side effects that usually do not require medical attention (report to your doctor or health care professional if they continue or are bothersome): constipation diarrhea hair loss loss of appetite nausea rash vomiting This list may not describe all possible side effects. Call your doctor for medical advice about side effects. You may report side effects to FDA at 1-800-FDA-1088. Where should I keep my medication? This drug is given in a hospital or clinic and will not be stored at home. NOTE: This sheet is a summary. It may not cover all possible information. If you have questions about this medicine, talk to your doctor, pharmacist, or health care provider.  2023 Elsevier/Gold Standard (2017-10-20 00:00:00) Durvalumab injection What is this medication? DURVALUMAB (dur VAL ue mab) is a monoclonal antibody. It is used to treat lung cancer. This medicine may be used for other purposes; ask your health care provider or pharmacist if you have questions. COMMON BRAND NAME(S): IMFINZI What should I tell my care team before I take this medication? They need to know if you have any of these conditions: autoimmune diseases like Crohn's disease, ulcerative colitis, or lupus have had or planning to have an allogeneic stem cell transplant (uses someone else's stem cells) history of  organ transplant history of radiation to the chest nervous system problems like myasthenia gravis or Guillain-Barre syndrome an unusual or allergic reaction to durvalumab, other medicines, foods, dyes, or preservatives pregnant or trying to get pregnant breast-feeding How should I use this medication? This medicine is for infusion into a vein. It is given by a health care professional in a hospital or clinic setting. A special MedGuide will be given to you before each treatment. Be sure to read this information carefully each time. Talk to your pediatrician regarding the use of this medicine in children. Special care may be needed. Overdosage: If you think you have taken too much of this medicine contact a poison control center or emergency room at once. NOTE: This medicine is only for you. Do not share this medicine with others. What if I miss a dose? It is important not to miss your dose. Call your doctor or health care professional if you are unable to keep an appointment. What may interact with this medication? Interactions have  not been studied. This list may not describe all possible interactions. Give your health care provider a list of all the medicines, herbs, non-prescription drugs, or dietary supplements you use. Also tell them if you smoke, drink alcohol, or use illegal drugs. Some items may interact with your medicine. What should I watch for while using this medication? This medication may make you feel generally unwell. Continue your course of treatment even though you feel ill unless your care team tells you to stop. You may need blood work done while you are taking this medication. Do not become pregnant while taking this medication or for 3 months after stopping it. Women should inform their care team if they wish to become pregnant or think they might be pregnant. There is a potential for serious side effects to an unborn child. Talk to your care team or pharmacist for more  information. Do not breast-feed an infant while taking this medication or for 3 months after stopping it. What side effects may I notice from receiving this medication? Side effects that you should report to your care team as soon as possible: Allergic reactions--skin rash, itching, hives, swelling of the face, lips, tongue, or throat Bloody or watery diarrhea Dizziness, loss of balance or coordination, confusion or trouble speaking Dry cough, shortness of breath or trouble breathing Flushing, mostly over the face, neck, and chest, during injection High blood sugar (hyperglycemia)--increased thirst or amount of urine, unusual weakness or fatigue, blurry vision High thyroid levels (hyperthyroidism)--fast or irregular heartbeat, weight loss, excessive sweating or sensitivity to heat, tremors or shaking, anxiety, nervousness, irregular menstrual cycle or spotting Infection--fever, chills, cough, or sore throat Liver injury--right upper belly pain, loss of appetite, nausea, light-colored stool, dark yellow or brown urine, yellowing skin or eyes, unusual weakness or fatigue Low adrenal gland function--nausea, vomiting, loss of appetite, unusual weakness or fatigue, dizziness, low blood pressure Low thyroid levels (hypothyroidism)--unusual weakness or fatigue, increased sensitivity to cold, constipation, hair loss, dry skin, weight gain, feelings of depression Pancreatitis--severe stomach pain that spreads to your back or gets worse after eating or when touched, fever, nausea, vomiting Rash, fever, and swollen lymph nodes Redness, blistering, peeling or loosening of the skin, including inside the mouth Wheezing--trouble breathing with loud or whistling sounds Side effects that usually do not require medical attention (report these to your care team if they continue or are bothersome): Fatigue Hair loss This list may not describe all possible side effects. Call your doctor for medical advice about side  effects. You may report side effects to FDA at 1-800-FDA-1088. Where should I keep my medication? This medication is given in a hospital or clinic. It will not be stored at home. NOTE: This sheet is a summary. It may not cover all possible information. If you have questions about this medicine, talk to your doctor, pharmacist, or health care provider.  2023 Elsevier/Gold Standard (2021-06-27 00:00:00)       To help prevent nausea and vomiting after your treatment, we encourage you to take your nausea medication as directed.  BELOW ARE SYMPTOMS THAT SHOULD BE REPORTED IMMEDIATELY: *FEVER GREATER THAN 100.4 F (38 C) OR HIGHER *CHILLS OR SWEATING *NAUSEA AND VOMITING THAT IS NOT CONTROLLED WITH YOUR NAUSEA MEDICATION *UNUSUAL SHORTNESS OF BREATH *UNUSUAL BRUISING OR BLEEDING *URINARY PROBLEMS (pain or burning when urinating, or frequent urination) *BOWEL PROBLEMS (unusual diarrhea, constipation, pain near the anus) TENDERNESS IN MOUTH AND THROAT WITH OR WITHOUT PRESENCE OF ULCERS (sore throat, sores in mouth, or a toothache)  UNUSUAL RASH, SWELLING OR PAIN  UNUSUAL VAGINAL DISCHARGE OR ITCHING   Items with * indicate a potential emergency and should be followed up as soon as possible or go to the Emergency Department if any problems should occur.  Please show the CHEMOTHERAPY ALERT CARD or IMMUNOTHERAPY ALERT CARD at check-in to the Emergency Department and triage nurse.  Should you have questions after your visit or need to cancel or reschedule your appointment, please contact [facility] [phone]  and follow the prompts.  Office hours are 8:00 a.m. to 4:30 p.m. Monday - Friday. Please note that voicemails left after 4:00 p.m. may not be returned until the following business day.  We are closed weekends and major holidays. You have access to a nurse at all times for urgent questions. Please call the main number to the clinic [phone] and follow the prompts.  For any  non-urgent questions, you may also contact your provider using MyChart. We now offer e-Visits for anyone 89 and older to request care online for non-urgent symptoms. For details visit [email or web address].   Also download the MyChart app! Go to the app store, search "MyChart", open the app, select [facility], and log in with your MyChart username and password.  Masks are optional in the cancer centers. If you would like for your care team to wear a mask while they are taking care of you, please let them know. For doctor visits, patients may have with them one support person who is at least 66 years old. At this time, visitors are not allowed in the infusion area.

## 2022-01-27 NOTE — Progress Notes (Signed)
Platelets 88 today.  Dr. Delton Coombes notified with ok to treat. Pharmacy aware.   Patient tolerated chemotherapy with no complaints voiced.  Side effects with management reviewed with understanding verbalized.  Port site clean and dry with no bruising or swelling noted at site.  Good blood return noted before and after administration of chemotherapy.  Band aid applied.  Patient left in satisfactory condition with VSS and no s/s of distress noted.

## 2022-01-27 NOTE — Telephone Encounter (Signed)
Wife stated patient had 3 rounds of chemo today. His weight this AM was 150.4 and now 157.2. He has a productive cough of clear sputum. Wife wanted to know if patient could take his prn lasix 20 mg. I told her yes if this is the only one today. I did check with pharmacist Pavero about lasix with chemo. I recommended that if patient's cough doesn't improve within 2 hours or he doesn't void, to take patient to the ED as she had in the past. Wife voiced understanding.

## 2022-01-28 NOTE — Telephone Encounter (Signed)
Called patient, advised of note from MD.  Patient wife did state they gave him lasix last night, will give him one today, and one tomorrow.  Aware to weigh daily and let us know of any changes.   Patient wife verbalized understanding

## 2022-02-11 ENCOUNTER — Inpatient Hospital Stay (HOSPITAL_COMMUNITY): Payer: Medicare Other

## 2022-02-11 ENCOUNTER — Inpatient Hospital Stay (HOSPITAL_COMMUNITY): Payer: Medicare Other | Attending: Hematology

## 2022-02-11 ENCOUNTER — Inpatient Hospital Stay (HOSPITAL_BASED_OUTPATIENT_CLINIC_OR_DEPARTMENT_OTHER): Payer: Medicare Other | Admitting: Hematology

## 2022-02-11 VITALS — BP 130/88 | HR 85 | Temp 97.6°F

## 2022-02-11 DIAGNOSIS — F17210 Nicotine dependence, cigarettes, uncomplicated: Secondary | ICD-10-CM | POA: Diagnosis not present

## 2022-02-11 DIAGNOSIS — I2510 Atherosclerotic heart disease of native coronary artery without angina pectoris: Secondary | ICD-10-CM | POA: Diagnosis not present

## 2022-02-11 DIAGNOSIS — Z5111 Encounter for antineoplastic chemotherapy: Secondary | ICD-10-CM | POA: Diagnosis present

## 2022-02-11 DIAGNOSIS — D696 Thrombocytopenia, unspecified: Secondary | ICD-10-CM | POA: Insufficient documentation

## 2022-02-11 DIAGNOSIS — Z5986 Financial insecurity: Secondary | ICD-10-CM | POA: Diagnosis not present

## 2022-02-11 DIAGNOSIS — G8929 Other chronic pain: Secondary | ICD-10-CM | POA: Insufficient documentation

## 2022-02-11 DIAGNOSIS — R634 Abnormal weight loss: Secondary | ICD-10-CM | POA: Diagnosis not present

## 2022-02-11 DIAGNOSIS — E785 Hyperlipidemia, unspecified: Secondary | ICD-10-CM | POA: Diagnosis not present

## 2022-02-11 DIAGNOSIS — R5383 Other fatigue: Secondary | ICD-10-CM | POA: Insufficient documentation

## 2022-02-11 DIAGNOSIS — I10 Essential (primary) hypertension: Secondary | ICD-10-CM | POA: Diagnosis not present

## 2022-02-11 DIAGNOSIS — Z8249 Family history of ischemic heart disease and other diseases of the circulatory system: Secondary | ICD-10-CM | POA: Insufficient documentation

## 2022-02-11 DIAGNOSIS — Z95828 Presence of other vascular implants and grafts: Secondary | ICD-10-CM

## 2022-02-11 DIAGNOSIS — R42 Dizziness and giddiness: Secondary | ICD-10-CM | POA: Insufficient documentation

## 2022-02-11 DIAGNOSIS — C221 Intrahepatic bile duct carcinoma: Secondary | ICD-10-CM | POA: Diagnosis present

## 2022-02-11 DIAGNOSIS — Z5112 Encounter for antineoplastic immunotherapy: Secondary | ICD-10-CM | POA: Diagnosis not present

## 2022-02-11 DIAGNOSIS — Z7902 Long term (current) use of antithrombotics/antiplatelets: Secondary | ICD-10-CM | POA: Insufficient documentation

## 2022-02-11 DIAGNOSIS — Z79899 Other long term (current) drug therapy: Secondary | ICD-10-CM | POA: Diagnosis not present

## 2022-02-11 LAB — COMPREHENSIVE METABOLIC PANEL
ALT: 46 U/L — ABNORMAL HIGH (ref 0–44)
AST: 49 U/L — ABNORMAL HIGH (ref 15–41)
Albumin: 3.2 g/dL — ABNORMAL LOW (ref 3.5–5.0)
Alkaline Phosphatase: 299 U/L — ABNORMAL HIGH (ref 38–126)
Anion gap: 8 (ref 5–15)
BUN: 13 mg/dL (ref 8–23)
CO2: 24 mmol/L (ref 22–32)
Calcium: 9 mg/dL (ref 8.9–10.3)
Chloride: 101 mmol/L (ref 98–111)
Creatinine, Ser: 1.03 mg/dL (ref 0.61–1.24)
GFR, Estimated: >60 mL/min (ref >60)
Glucose, Bld: 178 mg/dL — ABNORMAL HIGH (ref 70–99)
Potassium: 4.3 mmol/L (ref 3.5–5.1)
Sodium: 133 mmol/L — ABNORMAL LOW (ref 135–145)
Total Bilirubin: 1.1 mg/dL (ref 0.3–1.2)
Total Protein: 7 g/dL (ref 6.5–8.1)

## 2022-02-11 LAB — CBC WITH DIFFERENTIAL/PLATELET
Abs Immature Granulocytes: 0.01 10*3/uL (ref 0.00–0.07)
Basophils Absolute: 0 10*3/uL (ref 0.0–0.1)
Basophils Relative: 1 %
Eosinophils Absolute: 0.1 10*3/uL (ref 0.0–0.5)
Eosinophils Relative: 1 %
HCT: 32.7 % — ABNORMAL LOW (ref 39.0–52.0)
Hemoglobin: 11.3 g/dL — ABNORMAL LOW (ref 13.0–17.0)
Immature Granulocytes: 0 %
Lymphocytes Relative: 25 %
Lymphs Abs: 1.2 10*3/uL (ref 0.7–4.0)
MCH: 34.5 pg — ABNORMAL HIGH (ref 26.0–34.0)
MCHC: 34.6 g/dL (ref 30.0–36.0)
MCV: 99.7 fL (ref 80.0–100.0)
Monocytes Absolute: 0.6 10*3/uL (ref 0.1–1.0)
Monocytes Relative: 13 %
Neutro Abs: 2.8 10*3/uL (ref 1.7–7.7)
Neutrophils Relative %: 60 %
Platelets: 110 10*3/uL — ABNORMAL LOW (ref 150–400)
RBC: 3.28 MIL/uL — ABNORMAL LOW (ref 4.22–5.81)
RDW: 15 % (ref 11.5–15.5)
WBC: 4.7 10*3/uL (ref 4.0–10.5)
nRBC: 0 % (ref 0.0–0.2)

## 2022-02-11 LAB — MAGNESIUM: Magnesium: 1.8 mg/dL (ref 1.7–2.4)

## 2022-02-11 MED ORDER — HEPARIN SOD (PORK) LOCK FLUSH 100 UNIT/ML IV SOLN
500.0000 [IU] | Freq: Once | INTRAVENOUS | Status: AC | PRN
  Administered 2022-02-11: 500 [IU]

## 2022-02-11 MED ORDER — SODIUM CHLORIDE 0.9 % IV SOLN
150.0000 mg | Freq: Once | INTRAVENOUS | Status: AC
  Administered 2022-02-11: 150 mg via INTRAVENOUS
  Filled 2022-02-11: qty 5

## 2022-02-11 MED ORDER — SODIUM CHLORIDE 0.9 % IV SOLN: Freq: Once | INTRAVENOUS | Status: AC

## 2022-02-11 MED ORDER — SODIUM CHLORIDE 0.9 % IV SOLN
10.0000 mg | Freq: Once | INTRAVENOUS | Status: AC
  Administered 2022-02-11: 10 mg via INTRAVENOUS
  Filled 2022-02-11: qty 10

## 2022-02-11 MED ORDER — MAGNESIUM SULFATE 2 GM/50ML IV SOLN
2.0000 g | Freq: Once | INTRAVENOUS | Status: AC
  Administered 2022-02-11: 2 g via INTRAVENOUS
  Filled 2022-02-11: qty 50

## 2022-02-11 MED ORDER — PROCHLORPERAZINE MALEATE 10 MG PO TABS: ORAL_TABLET | ORAL | 3 refills | Status: DC

## 2022-02-11 MED ORDER — SODIUM CHLORIDE 0.9% FLUSH
10.0000 mL | INTRAVENOUS | Status: DC | PRN
  Administered 2022-02-11: 10 mL

## 2022-02-11 MED ORDER — SODIUM CHLORIDE 0.9 % IV SOLN
25.0000 mg/m2 | Freq: Once | INTRAVENOUS | Status: AC
  Administered 2022-02-11: 48 mg via INTRAVENOUS
  Filled 2022-02-11: qty 48

## 2022-02-11 MED ORDER — PALONOSETRON HCL INJECTION 0.25 MG/5ML
0.2500 mg | Freq: Once | INTRAVENOUS | Status: AC
  Administered 2022-02-11: 0.25 mg via INTRAVENOUS
  Filled 2022-02-11: qty 5

## 2022-02-11 MED ORDER — SODIUM CHLORIDE 0.9 % IV SOLN
500.0000 mg/m2 | Freq: Once | INTRAVENOUS | Status: AC
  Administered 2022-02-11: 950 mg via INTRAVENOUS
  Filled 2022-02-11: qty 24.98

## 2022-02-11 MED ORDER — POTASSIUM CHLORIDE IN NACL 20-0.9 MEQ/L-% IV SOLN
Freq: Once | INTRAVENOUS | Status: AC
  Filled 2022-02-11: qty 1000

## 2022-02-11 NOTE — Patient Instructions (Signed)
[facility] at [facility] Discharge Instructions  You were seen and examined today by Dr. Delton Coombes.  Proceed with treatment today. You will have a CT scan prior to your next cycle of chemotherapy.   Thank you for choosing [facility] at [facility] to provide your oncology and hematology care.  To afford each patient quality time with our provider, please arrive at least 15 minutes before your scheduled appointment time.   If you have a lab appointment with the [facility] please come in thru the Main Entrance and check in at the main information desk.  You need to re-schedule your appointment should you arrive 10 or more minutes late.  We strive to give you quality time with our providers, and arriving late affects you and other patients whose appointments are after yours.  Also, if you no show three or more times for appointments you may be dismissed from the clinic at the providers discretion.     Again, thank you for choosing [facility].  Our hope is that these requests will decrease the amount of time that you wait before being seen by our physicians.       _____________________________________________________________  Should you have questions after your visit to [facility], please contact our office at [phone] and follow the prompts.  Our office hours are 8:00 a.m. and 4:30 p.m. Monday - Friday.  Please note that voicemails left after 4:00 p.m. may not be returned until the following business day.  We are closed weekends and major holidays.  You do have access to a nurse 24-7, just call the main number to the clinic [phone] and do not press any options, hold on the line and a nurse will answer the phone.    For prescription refill requests, have your pharmacy contact our office and allow 72 hours.

## 2022-02-11 NOTE — Progress Notes (Signed)
Patient presents today for Gemzar and Cisplatin per providers order.  Vital signs and Labs reviewed by MD.  Message received from Adonis Huguenin RN/Dr. Delton Coombes patient okay for treatment.  Patient has voided 500cc.  Gemzar and Cisplatin given today per MD orders.  Stable during infusion without adverse affects.  Vital signs stable.  No complaints at this time.  Discharge from clinic ambulatory in stable condition.  Alert and oriented X 3.  Follow up with [facility] as scheduled.

## 2022-02-11 NOTE — Progress Notes (Signed)
Jonathan Keith, Jonathan Keith [id number]   CLINIC:  Medical Oncology/Hematology  PCP:  [name], PA-C [address] / [name] [address] [phone]   REASON FOR VISIT:  Follow-up for intrahepatic cholangiocarcinoma  PRIOR THERAPY: none  NGS Results: not done  CURRENT THERAPY: Cisplatin + Gemcitabine D1,8 q21d  BRIEF ONCOLOGIC HISTORY:  Oncology History  Cholangiocarcinoma ([place])  02/28/2021 Initial Diagnosis   Cholangiocarcinoma ([place])   07/17/2021 Cancer Staging   Staging form: Perihilar Bile Ducts, AJCC 8th Edition - Clinical stage from 07/17/2021: Stage IVB (cTX, cNX, pM1) - Signed by Derek Jack, MD on 07/17/2021 Stage prefix: Initial diagnosis   07/22/2021 -  Chemotherapy   Patient is on Treatment Plan : BILIARY TRACT Cisplatin + Gemcitabine D1,15 + Imfinzi day 1 q28d       CANCER STAGING:  Cancer Staging  Cholangiocarcinoma ([place]) Staging form: Perihilar Bile Ducts, AJCC 8th Edition - Clinical stage from 07/17/2021: Stage IVB (cTX, cNX, pM1) - Signed by Derek Jack, MD on 07/17/2021   INTERVAL HISTORY:  [name], a 66 y.o. male, returns for routine follow-up and consideration for next cycle of chemotherapy. Jonathan Keith was last seen on 01/13/2022.  Due for day #15, cycle #9 of Cisplatin + Gemcitabine today.   Overall, he tells me he has been feeling pretty well. His appetite had improved, and he is eating 3 meals with snack in between meal. His energy continues to be low, but his activity level has improved slightly. He denies tinnitus, diarrhea, skin rash, and tingling/numbness. He denies any changes to his medications. He takes Compazine TID. He has gained 2 lbs since his last visit. He denies ankle swellings.   Overall, he feels ready for next cycle of chemo today.    REVIEW OF SYSTEMS:  Review of Systems  Constitutional:  Positive for fatigue (improved). Negative for appetite change  (improved) and unexpected weight change (+2 lbs).  HENT:   Negative for tinnitus.   Cardiovascular:  Negative for leg swelling.  Gastrointestinal:  Negative for diarrhea.  Skin:  Negative for rash.  Neurological:  Positive for dizziness. Negative for numbness.  All other systems reviewed and are negative.   PAST MEDICAL/SURGICAL HISTORY:  Past Medical History:  Diagnosis Date   Cancer ([place])    CHF (congestive heart failure) (HCC)    Chronic pain    neck, back, knees   Class 1 obesity due to excess calories with body mass index (BMI) of 31.0 to 31.9 in adult    Claudication [facility])    Coronary artery disease    DDD (degenerative disc disease), cervical    Diabetes mellitus ([place])    Dyslipidemia    Hypertension    Port-A-Cath in place 07/20/2021   Tobacco dependence    Past Surgical History:  Procedure Laterality Date   BILIARY BRUSHING N/A 02/25/2021   Procedure: BILIARY BRUSHING;  Surgeon: Rogene Houston, MD;  Location: [facility];  Service: Gastroenterology;  Laterality: N/A;   BILIARY STENT PLACEMENT N/A 02/25/2021   Procedure: BILIARY STENT PLACEMENT;  Surgeon: Rogene Houston, MD;  Location: [facility];  Service: Gastroenterology;  Laterality: N/A;   BILIARY STENT PLACEMENT  02/27/2021   Procedure: BILIARY STENT PLACEMENT (10FR x 9cm) IN THE RIGHT SYSTEM;  Surgeon: Rogene Houston, MD;  Location: [facility];  Service: Gastroenterology;;   BREAST SURGERY Left    benign lump- in his 110s.   CARDIAC CATHETERIZATION     ERCP N/A  02/25/2021   Procedure: ENDOSCOPIC RETROGRADE CHOLANGIOPANCREATOGRAPHY (ERCP);  Surgeon: Rogene Houston, MD;  Location: [facility];  Service: Gastroenterology;  Laterality: N/A;   ERCP N/A 02/27/2021   Procedure: ENDOSCOPIC RETROGRADE CHOLANGIOPANCREATOGRAPHY (ERCP);  Surgeon: Rogene Houston, MD;  Location: [facility];  Service: Gastroenterology;  Laterality: N/A;   IR IMAGING GUIDED PORT INSERTION  07/21/2021   KNEE SURGERY Left    x2   LEFT HEART CATH AND  CORONARY ANGIOGRAPHY N/A 09/08/2021   Procedure: LEFT HEART CATH AND CORONARY ANGIOGRAPHY;  Surgeon: Early Osmond, MD;  Location: [facility] CV LAB;  Service: Cardiovascular;  Laterality: N/A;   SPHINCTEROTOMY N/A 02/25/2021   Procedure: SPHINCTEROTOMY;  Surgeon: Rogene Houston, MD;  Location: [facility];  Service: Gastroenterology;  Laterality: N/A;   STENT REMOVAL  02/27/2021   Procedure: STENT REMOVAL (8.5Fr x 9cm);  Surgeon: Rogene Houston, MD;  Location: [facility];  Service: Gastroenterology;;    SOCIAL HISTORY:  Social History   Socioeconomic History   Marital status: Married    Spouse name: Not on file   Number of children: Not on file   Years of education: Not on file   Highest education level: Not on file  Occupational History   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  Tobacco Use   Smoking status: Every Day    Packs/day: 0.50    Years: 56.00    Total pack years: 28.00    Types: Cigarettes   Smokeless tobacco: Never  Vaping Use   Vaping Use: Never used  Substance and Sexual Activity   Alcohol use: Not Currently    Comment: stopped 2.5 years ago 03/11/21   Drug use: Never   Sexual activity: Not on file  Other Topics Concern   Not on file  Social History Narrative   Not on file   Social Determinants of Health   Financial Resource Strain: Medium Risk (03/07/2021)   Overall Financial Resource Strain (CARDIA)    Difficulty of Paying Living Expenses: Somewhat hard  Food Insecurity: No Food Insecurity (03/07/2021)   Hunger Vital Sign    Worried About Running Out of Food in the Last Year: Never true    [place] in the Last Year: Never true  Transportation Needs: No Transportation Needs (03/07/2021)   PRAPARE - Hydrologist (Medical): No    Lack of Transportation (Non-Medical): No  Physical Activity: Insufficiently Active (03/07/2021)   Exercise Vital Sign    Days of Exercise per Week: 2 days    Minutes of Exercise per Session: 20 min   Stress: No Stress Concern Present (03/07/2021)   [place]    Feeling of Stress : Only a little  Social Connections: Moderately Integrated (03/07/2021)   Social Connection and Isolation Panel [NHANES]    Frequency of Communication with Friends and Family: More than three times a week    Frequency of Social Gatherings with Friends and Family: More than three times a week    Attends Religious Services: More than 4 times per year    Active Member of [employer] or Organizations: No    Attends Archivist Meetings: Never    Marital Status: Married  Human resources officer Violence: Not At Risk (03/07/2021)   Humiliation, Afraid, Rape, and Kick questionnaire    Fear of Current or Ex-Partner: No    Emotionally Abused: No    Physically Abused: No    Sexually Abused: No  FAMILY HISTORY:  Family History  Problem Relation Age of Onset   CAD Mother    Cancer Neg Hx     CURRENT MEDICATIONS:  Current Outpatient Medications  Medication Sig Dispense Refill   acetaminophen (TYLENOL) 500 MG tablet Take 500 mg by mouth every 6 (six) hours as needed for moderate pain or headache.     albuterol (VENTOLIN HFA) 108 (90 Base) MCG/ACT inhaler Inhale 2 puffs into the lungs every 6 (six) hours as needed for wheezing or shortness of breath. 6.7 g 1   aspirin EC 81 MG tablet Take 81 mg by mouth daily. Swallow whole.     atorvastatin (LIPITOR) 40 MG tablet Take 1 tablet (40 mg total) by mouth daily at 6 PM. 90 tablet 3   CISPLATIN IV Inject into the vein once a week. Days 1, 8 every 21 days     clopidogrel (PLAVIX) 75 MG tablet Take 1 tablet (75 mg total) by mouth daily with breakfast. 90 tablet 3   Durvalumab (IMFINZI IV) Inject into the vein every 21 ( twenty-one) days.     famotidine (PEPCID) 20 MG tablet Take 20 mg by mouth daily.     ferrous sulfate (FERROUSUL) 325 (65 FE) MG tablet Take 1 tablet (325 mg total) by mouth daily with  breakfast.     furosemide (LASIX) 20 MG tablet Take 20 mg daily as needed for swelling 90 tablet 1   Gemcitabine HCl (GEMZAR IV) Inject into the vein once a week. Days 1, 8 every 21 days     Ginseng 100 MG CAPS Take 1 capsule by mouth daily.     lidocaine-prilocaine (EMLA) cream Apply a small amount to port a cath site and cover with plastic wrap 1 hour prior to chemotherapy appointments 30 g 3   loratadine (CLARITIN) 10 MG tablet Take 10 mg by mouth daily. Takes for a few days after shot to boost WBC. About every 2nd treatment     metFORMIN (GLUCOPHAGE) 500 MG tablet Take 1 tablet (500 mg total) by mouth 2 (two) times daily with a meal. 60 tablet 0   midodrine (PROAMATINE) 5 MG tablet Take 1 tablet (5 mg total) by mouth 3 (three) times daily with meals. 270 tablet 3   nitroGLYCERIN (NITROSTAT) 0.4 MG SL tablet Place 1 tablet (0.4 mg total) under the tongue every 5 (five) minutes as needed for chest pain. 25 tablet 3   pantoprazole (PROTONIX) 40 MG tablet Take 1 tablet (40 mg total) by mouth daily. 90 tablet 3   prochlorperazine (COMPAZINE) 10 MG tablet TAKE (1) TABLET BY MOUTH EVERY (6) HOURS AS NEEDED. 60 tablet 0   tamsulosin (FLOMAX) 0.4 MG CAPS capsule TAKE (2) CAPSULES BY MOUTH AT BEDTIME. (Patient taking differently: 0.4 mg at bedtime. If needed can take additional tablet) 60 capsule 2   No current facility-administered medications for this visit.    ALLERGIES:  No Known Allergies  PHYSICAL EXAM:  Performance status (ECOG): 0 - Asymptomatic  There were no vitals filed for this visit. Wt Readings from Last 3 Encounters:  01/27/22 153 lb 6.4 oz (69.6 kg)  01/13/22 151 lb (68.5 kg)  01/13/22 151 lb 6.4 oz (68.7 kg)   Physical Exam Vitals reviewed.  Constitutional:      Appearance: Normal appearance.  Cardiovascular:     Rate and Rhythm: Normal rate and regular rhythm.     Pulses: Normal pulses.     Heart sounds: Normal heart sounds.  Pulmonary:  Effort: Pulmonary effort is  normal.     Breath sounds: Normal breath sounds.  Abdominal:     Palpations: Abdomen is soft. There is no mass.     Tenderness: There is no abdominal tenderness.  Musculoskeletal:     Right lower leg: No edema.     Left lower leg: No edema.  Neurological:     General: No focal deficit present.     Mental Status: He is alert and oriented to person, place, and time.  Psychiatric:        Mood and Affect: Mood normal.        Behavior: Behavior normal.     LABORATORY DATA:  I have reviewed the labs as listed.     Latest Ref Rng & Units 01/27/2022    7:42 AM 01/13/2022    7:53 AM 12/30/2021    7:21 AM  CBC  WBC 4.0 - 10.5 K/uL 4.3  4.5  5.4   Hemoglobin 13.0 - 17.0 g/dL 11.2  10.7  11.1   Hematocrit 39.0 - 52.0 % 33.3  32.1  33.7   Platelets 150 - 400 K/uL 88  85  96       Latest Ref Rng & Units 01/27/2022    7:42 AM 01/13/2022    7:53 AM 12/30/2021    7:21 AM  CMP  Glucose 70 - 99 mg/dL 169  163  170   BUN 8 - 23 mg/dL '16  17  17   '$ Creatinine 0.61 - 1.24 mg/dL 1.05  0.97  1.03   Sodium 135 - 145 mmol/L 135  134  134   Potassium 3.5 - 5.1 mmol/L 4.2  3.9  3.8   Chloride 98 - 111 mmol/L 105  103  105   CO2 22 - 32 mmol/L '24  25  23   '$ Calcium 8.9 - 10.3 mg/dL 9.0  9.0  9.1   Total Protein 6.5 - 8.1 g/dL 6.7  6.8  6.8   Total Bilirubin 0.3 - 1.2 mg/dL 1.1  1.2  1.4   Alkaline Phos 38 - 126 U/L 298  371  426   AST 15 - 41 U/L 48  49  58   ALT 0 - 44 U/L 43  46  49     DIAGNOSTIC IMAGING:  I have independently reviewed the scans and discussed with the patient. No results found.   ASSESSMENT:  Metastatic cholangiocarcinoma: - 02/2021 initial diagnosis presentation with painless jaundice - ERCP with sphincterotomy, subsequent ERCP with plastic biliary stent placement. - MRI-2 cm mass involving the common hepatic duct with upstream biliary dilatation. - 03/2021 consult at [facility] with GI and surgery-confirm diagnosis of perihilar cholangiocarcinoma - 03/21/2021-biopsy/pathology,  ERCP-common hepatic duct cytology and brushings performed.  EUS-1 enlarged lymph node versus tumor implant in the gastrohepatic ligament-negative for malignancy.  Single biliary plastic stent removed and replaced with the plastic stent in the right and left ducts. - 04/17/2021-right portal vein embolization to promote left hepatic hypertrophy. - 07/02/2021-diagnostic laparoscopy with multiple areas concerning for metastatic blocks identified in the abdomen and peritoneal surfaces.  6 separate biopsies obtained. - Pathology consistent with metastatic carcinoma. - 37 pound weight loss in the last 5 months. - Cycle 1 of gemcitabine, cisplatin, durvalumab on 07/22/2021.  2.  Social history/family: - Lives at home with his wife.  He worked as a Development worker, community. - Current active smoker, 1 pack/day since age 71. - Maternal grandmother had cancer.  Maternal uncle had brain tumor.  PLAN:  Metastatic cholangiocarcinoma: - CT CAP (12/16/2021): Stable chronic intrahepatic biliary ductal dilatation with no new changes.  No metastatic disease outside the liver.  Stent is stable. - He has tolerated last cycle very well.  No ringing in the ears.  No skin rashes or other immunotherapy related side effects. - Reviewed labs today which showed mild elevation of AST and ALT which is stable.  Electrolytes and creatinine was normal.  CBC shows mild thrombocytopenia from myelosuppression.  Last CEA 19-9 was 323 and downtrending. - Recommend proceed with cycle 9-day 15 today and cycle 10-day 1 in 2 weeks.  RTC 4 weeks for follow-up with repeat tumor marker.  I plan to repeat CT CAP with contrast prior to next visit.  2.  Weight loss: - He has gained another 2.8 pounds since last visit.  He is also eating well.  3.  Urinary problems: - Continue Flomax 0.8 mg daily.  4.  Hypotension: - Continue midodrine 5 mg 3 times daily.  Blood pressure today is 136/83.   Orders placed this encounter:  No orders of the  defined types were placed in this encounter.    Derek Jack, MD [place] [phone]   I, Thana Ates, am acting as a scribe for Dr. Derek Jack.  I, Derek Jack MD, have reviewed the above documentation for accuracy and completeness, and I agree with the above.

## 2022-02-11 NOTE — Patient Instructions (Signed)
[facility]  Discharge Instructions: Thank you for choosing [facility] to provide your oncology and hematology care.  If you have a lab appointment with the [facility], please come in thru the Main Entrance and check in at the main information desk.  Wear comfortable clothing and clothing appropriate for easy access to any Portacath or PICC line.   We strive to give you quality time with your provider. You may need to reschedule your appointment if you arrive late (15 or more minutes).  Arriving late affects you and other patients whose appointments are after yours.  Also, if you miss three or more appointments without notifying the office, you may be dismissed from the clinic at the provider's discretion.      For prescription refill requests, have your pharmacy contact our office and allow 72 hours for refills to be completed.    Today you received the following chemotherapy and/or immunotherapy agents Cisplatin Gemzar      To help prevent nausea and vomiting after your treatment, we encourage you to take your nausea medication as directed.  BELOW ARE SYMPTOMS THAT SHOULD BE REPORTED IMMEDIATELY: *FEVER GREATER THAN 100.4 F (38 C) OR HIGHER *CHILLS OR SWEATING *NAUSEA AND VOMITING THAT IS NOT CONTROLLED WITH YOUR NAUSEA MEDICATION *UNUSUAL SHORTNESS OF BREATH *UNUSUAL BRUISING OR BLEEDING *URINARY PROBLEMS (pain or burning when urinating, or frequent urination) *BOWEL PROBLEMS (unusual diarrhea, constipation, pain near the anus) TENDERNESS IN MOUTH AND THROAT WITH OR WITHOUT PRESENCE OF ULCERS (sore throat, sores in mouth, or a toothache) UNUSUAL RASH, SWELLING OR PAIN  UNUSUAL VAGINAL DISCHARGE OR ITCHING   Items with * indicate a potential emergency and should be followed up as soon as possible or go to the Emergency Department if any problems should occur.  Please show the CHEMOTHERAPY ALERT CARD or IMMUNOTHERAPY ALERT CARD at check-in to the Emergency  Department and triage nurse.  Should you have questions after your visit or need to cancel or reschedule your appointment, please contact [facility] [phone]  and follow the prompts.  Office hours are 8:00 a.m. to 4:30 p.m. Monday - Friday. Please note that voicemails left after 4:00 p.m. may not be returned until the following business day.  We are closed weekends and major holidays. You have access to a nurse at all times for urgent questions. Please call the main number to the clinic [phone] and follow the prompts.  For any non-urgent questions, you may also contact your provider using MyChart. We now offer e-Visits for anyone 49 and older to request care online for non-urgent symptoms. For details visit [email or web address].   Also download the MyChart app! Go to the app store, search "MyChart", open the app, select [facility], and log in with your MyChart username and password.  Masks are optional in the cancer centers. If you would like for your care team to wear a mask while they are taking care of you, please let them know. For doctor visits, patients may have with them one support person who is at least 66 years old. At this time, visitors are not allowed in the infusion area.

## 2022-02-12 LAB — CANCER ANTIGEN 19-9: CA 19-9: 275 U/mL — ABNORMAL HIGH (ref 0–35)

## 2022-02-24 ENCOUNTER — Inpatient Hospital Stay (HOSPITAL_COMMUNITY): Payer: Medicare Other

## 2022-02-24 ENCOUNTER — Inpatient Hospital Stay (HOSPITAL_COMMUNITY): Payer: Medicare Other | Admitting: Hematology

## 2022-03-02 ENCOUNTER — Other Ambulatory Visit: Payer: Self-pay

## 2022-03-03 ENCOUNTER — Inpatient Hospital Stay (HOSPITAL_COMMUNITY): Payer: Medicare Other

## 2022-03-03 VITALS — BP 139/86 | HR 79 | Temp 97.7°F | Resp 18

## 2022-03-03 DIAGNOSIS — Z79899 Other long term (current) drug therapy: Secondary | ICD-10-CM

## 2022-03-03 DIAGNOSIS — C221 Intrahepatic bile duct carcinoma: Secondary | ICD-10-CM

## 2022-03-03 DIAGNOSIS — Z95828 Presence of other vascular implants and grafts: Secondary | ICD-10-CM

## 2022-03-03 DIAGNOSIS — Z5112 Encounter for antineoplastic immunotherapy: Secondary | ICD-10-CM | POA: Diagnosis not present

## 2022-03-03 LAB — CBC WITH DIFFERENTIAL/PLATELET
Abs Immature Granulocytes: 0.01 10*3/uL (ref 0.00–0.07)
Basophils Absolute: 0 10*3/uL (ref 0.0–0.1)
Basophils Relative: 1 %
Eosinophils Absolute: 0.1 10*3/uL (ref 0.0–0.5)
Eosinophils Relative: 1 %
HCT: 33.2 % — ABNORMAL LOW (ref 39.0–52.0)
Hemoglobin: 11.1 g/dL — ABNORMAL LOW (ref 13.0–17.0)
Immature Granulocytes: 0 %
Lymphocytes Relative: 24 %
Lymphs Abs: 1 10*3/uL (ref 0.7–4.0)
MCH: 33.2 pg (ref 26.0–34.0)
MCHC: 33.4 g/dL (ref 30.0–36.0)
MCV: 99.4 fL (ref 80.0–100.0)
Monocytes Absolute: 0.7 10*3/uL (ref 0.1–1.0)
Monocytes Relative: 17 %
Neutro Abs: 2.4 10*3/uL (ref 1.7–7.7)
Neutrophils Relative %: 57 %
Platelets: 114 10*3/uL — ABNORMAL LOW (ref 150–400)
RBC: 3.34 MIL/uL — ABNORMAL LOW (ref 4.22–5.81)
RDW: 15.8 % — ABNORMAL HIGH (ref 11.5–15.5)
WBC: 4.2 10*3/uL (ref 4.0–10.5)
nRBC: 0 % (ref 0.0–0.2)

## 2022-03-03 LAB — COMPREHENSIVE METABOLIC PANEL
ALT: 49 U/L — ABNORMAL HIGH (ref 0–44)
AST: 58 U/L — ABNORMAL HIGH (ref 15–41)
Albumin: 2.9 g/dL — ABNORMAL LOW (ref 3.5–5.0)
Alkaline Phosphatase: 274 U/L — ABNORMAL HIGH (ref 38–126)
Anion gap: 6 (ref 5–15)
BUN: 13 mg/dL (ref 8–23)
CO2: 22 mmol/L (ref 22–32)
Calcium: 8.8 mg/dL — ABNORMAL LOW (ref 8.9–10.3)
Chloride: 105 mmol/L (ref 98–111)
Creatinine, Ser: 1.07 mg/dL (ref 0.61–1.24)
GFR, Estimated: >60 mL/min (ref >60)
Glucose, Bld: 201 mg/dL — ABNORMAL HIGH (ref 70–99)
Potassium: 3.8 mmol/L (ref 3.5–5.1)
Sodium: 133 mmol/L — ABNORMAL LOW (ref 135–145)
Total Bilirubin: 1.3 mg/dL — ABNORMAL HIGH (ref 0.3–1.2)
Total Protein: 6.7 g/dL (ref 6.5–8.1)

## 2022-03-03 LAB — MAGNESIUM: Magnesium: 1.5 mg/dL — ABNORMAL LOW (ref 1.7–2.4)

## 2022-03-03 LAB — TSH: TSH: 0.616 u[IU]/mL (ref 0.350–4.500)

## 2022-03-03 MED ORDER — SODIUM CHLORIDE 0.9 % IV SOLN
150.0000 mg | Freq: Once | INTRAVENOUS | Status: AC
  Administered 2022-03-03: 150 mg via INTRAVENOUS
  Filled 2022-03-03: qty 150

## 2022-03-03 MED ORDER — POTASSIUM CHLORIDE IN NACL 20-0.9 MEQ/L-% IV SOLN
Freq: Once | INTRAVENOUS | Status: AC
  Filled 2022-03-03: qty 1000

## 2022-03-03 MED ORDER — SODIUM CHLORIDE 0.9 % IV SOLN: Freq: Once | INTRAVENOUS | Status: AC

## 2022-03-03 MED ORDER — MAGNESIUM SULFATE 2 GM/50ML IV SOLN
2.0000 g | Freq: Once | INTRAVENOUS | Status: AC
  Administered 2022-03-03: 2 g via INTRAVENOUS
  Filled 2022-03-03: qty 50

## 2022-03-03 MED ORDER — SODIUM CHLORIDE 0.9 % IV SOLN
10.0000 mg | Freq: Once | INTRAVENOUS | Status: AC
  Administered 2022-03-03: 10 mg via INTRAVENOUS
  Filled 2022-03-03: qty 10

## 2022-03-03 MED ORDER — SODIUM CHLORIDE 0.9% FLUSH
10.0000 mL | INTRAVENOUS | Status: DC | PRN
  Administered 2022-03-03: 10 mL

## 2022-03-03 MED ORDER — SODIUM CHLORIDE 0.9 % IV SOLN
500.0000 mg/m2 | Freq: Once | INTRAVENOUS | Status: AC
  Administered 2022-03-03: 950 mg via INTRAVENOUS
  Filled 2022-03-03: qty 24.98

## 2022-03-03 MED ORDER — HEPARIN SOD (PORK) LOCK FLUSH 100 UNIT/ML IV SOLN
500.0000 [IU] | Freq: Once | INTRAVENOUS | Status: AC | PRN
  Administered 2022-03-03: 500 [IU]

## 2022-03-03 MED ORDER — SODIUM CHLORIDE 0.9 % IV SOLN
25.0000 mg/m2 | Freq: Once | INTRAVENOUS | Status: AC
  Administered 2022-03-03: 48 mg via INTRAVENOUS
  Filled 2022-03-03: qty 48

## 2022-03-03 MED ORDER — PALONOSETRON HCL INJECTION 0.25 MG/5ML
0.2500 mg | Freq: Once | INTRAVENOUS | Status: AC
  Administered 2022-03-03: 0.25 mg via INTRAVENOUS
  Filled 2022-03-03: qty 5

## 2022-03-03 MED ORDER — SODIUM CHLORIDE 0.9 % IV SOLN
1500.0000 mg | Freq: Once | INTRAVENOUS | Status: AC
  Administered 2022-03-03: 1500 mg via INTRAVENOUS
  Filled 2022-03-03: qty 30

## 2022-03-03 NOTE — Patient Instructions (Signed)
[facility]  Discharge Instructions: Thank you for choosing [facility] to provide your oncology and hematology care.  If you have a lab appointment with the [facility], please come in thru the Main Entrance and check in at the main information desk.  Wear comfortable clothing and clothing appropriate for easy access to any Portacath or PICC line.   We strive to give you quality time with your provider. You may need to reschedule your appointment if you arrive late (15 or more minutes).  Arriving late affects you and other patients whose appointments are after yours.  Also, if you miss three or more appointments without notifying the office, you may be dismissed from the clinic at the provider's discretion.      For prescription refill requests, have your pharmacy contact our office and allow 72 hours for refills to be completed.    Today you received the following chemotherapy and/or immunotherapy agents Imfinzi/Gemzar/Cisplatin      To help prevent nausea and vomiting after your treatment, we encourage you to take your nausea medication as directed.  BELOW ARE SYMPTOMS THAT SHOULD BE REPORTED IMMEDIATELY: *FEVER GREATER THAN 100.4 F (38 C) OR HIGHER *CHILLS OR SWEATING *NAUSEA AND VOMITING THAT IS NOT CONTROLLED WITH YOUR NAUSEA MEDICATION *UNUSUAL SHORTNESS OF BREATH *UNUSUAL BRUISING OR BLEEDING *URINARY PROBLEMS (pain or burning when urinating, or frequent urination) *BOWEL PROBLEMS (unusual diarrhea, constipation, pain near the anus) TENDERNESS IN MOUTH AND THROAT WITH OR WITHOUT PRESENCE OF ULCERS (sore throat, sores in mouth, or a toothache) UNUSUAL RASH, SWELLING OR PAIN  UNUSUAL VAGINAL DISCHARGE OR ITCHING   Items with * indicate a potential emergency and should be followed up as soon as possible or go to the Emergency Department if any problems should occur.  Please show the CHEMOTHERAPY ALERT CARD or IMMUNOTHERAPY ALERT CARD at check-in to the  Emergency Department and triage nurse.  Should you have questions after your visit or need to cancel or reschedule your appointment, please contact [facility] [phone]  and follow the prompts.  Office hours are 8:00 a.m. to 4:30 p.m. Monday - Friday. Please note that voicemails left after 4:00 p.m. may not be returned until the following business day.  We are closed weekends and major holidays. You have access to a nurse at all times for urgent questions. Please call the main number to the clinic [phone] and follow the prompts.  For any non-urgent questions, you may also contact your provider using MyChart. We now offer e-Visits for anyone 66 and older to request care online for non-urgent symptoms. For details visit [email or web address].   Also download the MyChart app! Go to the app store, search "MyChart", open the app, select [facility], and log in with your MyChart username and password.  Masks are optional in the cancer centers. If you would like for your care team to wear a mask while they are taking care of you, please let them know. For doctor visits, patients may have with them one support person who is at least 66 years old. At this time, visitors are not allowed in the infusion area.

## 2022-03-03 NOTE — Progress Notes (Signed)
Patient presents today for Imfinzi/Gemzar/Cisplatin per providers order.  Vital signs within parameters for treatment.  Labs pending.  Patients only complaint is of fatigue.  Labs within parameters for treatment.  Imfinzi/Gemzar/Cisplatin given today per MD orders.  Stable during infusion without adverse affects.  Vital signs stable.  No complaints at this time.  Discharge from clinic ambulatory in stable condition.  Alert and oriented X 3.  Follow up with [facility] as scheduled.

## 2022-03-05 ENCOUNTER — Other Ambulatory Visit (HOSPITAL_COMMUNITY): Payer: Self-pay | Admitting: Hematology

## 2022-03-05 LAB — CANCER ANTIGEN 19-9: CA 19-9: 299 U/mL — ABNORMAL HIGH (ref 0–35)

## 2022-03-10 ENCOUNTER — Inpatient Hospital Stay (HOSPITAL_COMMUNITY): Payer: Medicare Other

## 2022-03-10 ENCOUNTER — Inpatient Hospital Stay (HOSPITAL_COMMUNITY): Payer: Medicare Other | Admitting: Hematology

## 2022-03-13 NOTE — Progress Notes (Signed)
Cardiology Office Note:   Date:  03/18/2022  NAME:  BRAHM BARBEAU    MRN: [id number] DOB:  1956-04-10   PCP:  Redmond School, MD  Cardiologist:  Evalina Field, MD  Electrophysiologist:  None   Referring MD: [name], PA-C   Chief Complaint  Patient presents with   Follow-up        History of Present Illness:   NIRAJ KUDRNA is a 67 y.o. male with a hx of three-vessel CAD, ischemic cardiomyopathy, metastatic cholangiocarcinoma who presents for follow-up.  Known history of severe three-vessel CAD with ischemic cardiomyopathy.  Being managed medically due to metastatic cancer. He reports he is doing well.  His cancer apparently is controlled.  It would never be cured but seems to be controlled per his report and review of records.  His anemia seems to be improving.  He reports no bleeding.  He is not on aspirin or Plavix.  Did not tolerate this in the past.  Denies any chest pain.  No evidence of volume overload.  He is taking Lasix as needed.  He reports weight gain after chemotherapy.  I suspect he gets fluids with this.  He has not had any recent lipids checked.  Needs to have this done.  Remains on midodrine.  Blood pressure is stable.  Has had issues with low blood pressure in the past.  No evidence of volume overload.  No chest pain reported.  Overall seems to be doing well.  He is really only on Lipitor for medical therapy of his CAD.  Problem List Systolic HF -EF 70-96% CAD -NSTEMI 09/2021 -3v CAD managed medically in setting of advanced cancer  3. Metastatic cholangiocarcinoma  4. HLD -T chol 167, HDL 27, LDL 123, triglycerides 86 5. DM -A1c 5.8 6. Anemia  7. Tobacco abuse  -1ppd x 50 years   Past Medical History: Past Medical History:  Diagnosis Date   Cancer ([place])    CHF (congestive heart failure) (HCC)    Chronic pain    neck, back, knees   Class 1 obesity due to excess calories with body mass index (BMI) of 31.0 to 31.9 in adult    Claudication [facility])     Coronary artery disease    DDD (degenerative disc disease), cervical    Diabetes mellitus ([place])    Dyslipidemia    Hypertension    Port-A-Cath in place 07/20/2021   Tobacco dependence     Past Surgical History: Past Surgical History:  Procedure Laterality Date   BILIARY BRUSHING N/A 02/25/2021   Procedure: BILIARY BRUSHING;  Surgeon: Rogene Houston, MD;  Location: [facility];  Service: Gastroenterology;  Laterality: N/A;   BILIARY STENT PLACEMENT N/A 02/25/2021   Procedure: BILIARY STENT PLACEMENT;  Surgeon: Rogene Houston, MD;  Location: [facility];  Service: Gastroenterology;  Laterality: N/A;   BILIARY STENT PLACEMENT  02/27/2021   Procedure: BILIARY STENT PLACEMENT (10FR x 9cm) IN THE RIGHT SYSTEM;  Surgeon: Rogene Houston, MD;  Location: [facility];  Service: Gastroenterology;;   BREAST SURGERY Left    benign lump- in his 33s.   CARDIAC CATHETERIZATION     ERCP N/A 02/25/2021   Procedure: ENDOSCOPIC RETROGRADE CHOLANGIOPANCREATOGRAPHY (ERCP);  Surgeon: Rogene Houston, MD;  Location: [facility];  Service: Gastroenterology;  Laterality: N/A;   ERCP N/A 02/27/2021   Procedure: ENDOSCOPIC RETROGRADE CHOLANGIOPANCREATOGRAPHY (ERCP);  Surgeon: Rogene Houston, MD;  Location: [facility];  Service: Gastroenterology;  Laterality: N/A;   IR IMAGING  GUIDED PORT INSERTION  07/21/2021   KNEE SURGERY Left    x2   LEFT HEART CATH AND CORONARY ANGIOGRAPHY N/A 09/08/2021   Procedure: LEFT HEART CATH AND CORONARY ANGIOGRAPHY;  Surgeon: Early Osmond, MD;  Location: [facility];  Service: Cardiovascular;  Laterality: N/A;   SPHINCTEROTOMY N/A 02/25/2021   Procedure: SPHINCTEROTOMY;  Surgeon: Rogene Houston, MD;  Location: [facility];  Service: Gastroenterology;  Laterality: N/A;   STENT REMOVAL  02/27/2021   Procedure: STENT REMOVAL (8.5Fr x 9cm);  Surgeon: Rogene Houston, MD;  Location: [facility];  Service: Gastroenterology;;    Current Medications: Current Meds  Medication Sig    acetaminophen (TYLENOL) 500 MG tablet Take 500 mg by mouth every 6 (six) hours as needed for moderate pain or headache.   albuterol (VENTOLIN HFA) 108 (90 Base) MCG/ACT inhaler Inhale 2 puffs into the lungs every 6 (six) hours as needed for wheezing or shortness of breath.   atorvastatin (LIPITOR) 40 MG tablet Take 1 tablet (40 mg total) by mouth daily at 6 PM.   CISPLATIN IV Inject into the vein once a week. Days 1, 8 every 21 days   Durvalumab (IMFINZI IV) Inject into the vein every 21 ( twenty-one) days.   famotidine (PEPCID) 20 MG tablet Take 20 mg by mouth daily.   ferrous sulfate (FERROUSUL) 325 (65 FE) MG tablet Take 1 tablet (325 mg total) by mouth daily with breakfast.   furosemide (LASIX) 20 MG tablet Take 20 mg daily as needed for swelling   Gemcitabine HCl (GEMZAR IV) Inject into the vein once a week. Days 1, 8 every 21 days   Ginseng 100 MG CAPS Take 1 capsule by mouth daily.   lidocaine-prilocaine (EMLA) cream Apply a small amount to port a cath site and cover with plastic wrap 1 hour prior to chemotherapy appointments   loratadine (CLARITIN) 10 MG tablet Take 10 mg by mouth daily. Takes for a few days after shot to boost WBC. About every 2nd treatment   midodrine (PROAMATINE) 5 MG tablet Take 1 tablet (5 mg total) by mouth 3 (three) times daily with meals.   nitroGLYCERIN (NITROSTAT) 0.4 MG SL tablet Place 1 tablet (0.4 mg total) under the tongue every 5 (five) minutes as needed for chest pain.   pantoprazole (PROTONIX) 40 MG tablet Take 1 tablet (40 mg total) by mouth daily.   prochlorperazine (COMPAZINE) 10 MG tablet TAKE (1) TABLET BY MOUTH EVERY (6) HOURS AS NEEDED.   tamsulosin (FLOMAX) 0.4 MG CAPS capsule TAKE (2) CAPSULES BY MOUTH AT BEDTIME.     Allergies:    Patient has no known allergies.   Social History: Social History   Socioeconomic History   Marital status: Married    Spouse name: Not on file   Number of children: Not on file   Years of education: Not on  file   Highest education level: Not on file  Occupational History   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  Tobacco Use   Smoking status: Every Day    Packs/day: 0.50    Years: 56.00    Total pack years: 28.00    Types: Cigarettes   Smokeless tobacco: Never  Vaping Use   Vaping Use: Never used  Substance and Sexual Activity   Alcohol use: Not Currently    Comment: stopped 2.5 years ago 03/11/21   Drug use: Never   Sexual activity: Not on file  Other Topics Concern   Not on file  Social  History Narrative   Not on file   Social Determinants of Health   Financial Resource Strain: Medium Risk (03/07/2021)   Overall Financial Resource Strain (CARDIA)    Difficulty of Paying Living Expenses: Somewhat hard  Food Insecurity: No Food Insecurity (03/07/2021)   Hunger Vital Sign    Worried About Running Out of Food in the Last Year: Never true    Ran Out of Food in the Last Year: Never true  Transportation Needs: No Transportation Needs (03/07/2021)   PRAPARE - Hydrologist (Medical): No    Lack of Transportation (Non-Medical): No  Physical Activity: Insufficiently Active (03/07/2021)   Exercise Vital Sign    Days of Exercise per Week: 2 days    Minutes of Exercise per Session: 20 min  Stress: No Stress Concern Present (03/07/2021)   [place]    Feeling of Stress : Only a little  Social Connections: Moderately Integrated (03/07/2021)   Social Connection and Isolation Panel [NHANES]    Frequency of Communication with Friends and Family: More than three times a week    Frequency of Social Gatherings with Friends and Family: More than three times a week    Attends Religious Services: More than 4 times per year    Active Member of [employer] or Organizations: No    Attends Archivist Meetings: Never    Marital Status: Married     Family History: The patient's family history includes CAD  in his mother. There is no history of Cancer.  ROS:   All other ROS reviewed and negative. Pertinent positives noted in the HPI.     EKGs/Labs/Other Studies Reviewed:   The following studies were personally reviewed by me today:  Recent Labs: 11/26/2021: B Natriuretic Peptide 518.0 03/03/2022: TSH 0.616 03/17/2022: ALT 47; BUN 15; Creatinine, Ser 1.22; Hemoglobin 10.9; Magnesium 1.7; Platelets 100; Potassium 4.2; Sodium 134   Recent Lipid Panel    Component Value Date/Time   CHOL 167 09/09/2021 0111   TRIG 86 09/09/2021 0111   HDL 27 (L) 09/09/2021 0111   CHOLHDL 6.2 09/09/2021 0111   VLDL 17 09/09/2021 0111   LDLCALC 123 (H) 09/09/2021 0111    Physical Exam:   VS:  BP 130/74   Pulse 84   Ht '5\' 11"'$  (1.803 m)   Wt 159 lb (72.1 kg)   SpO2 96%   BMI 22.18 kg/m    Wt Readings from Last 3 Encounters:  03/18/22 159 lb (72.1 kg)  03/17/22 155 lb (70.3 kg)  03/03/22 155 lb (70.3 kg)    General: Well nourished, well developed, in no acute distress Head: Atraumatic, normal size  Eyes: PEERLA, EOMI  Neck: Supple, no JVD Endocrine: No thryomegaly Cardiac: Normal S1, S2; RRR; no murmurs, rubs, or gallops Lungs: Clear to auscultation bilaterally, no wheezing, rhonchi or rales  Abd: Soft, nontender, no hepatomegaly  Ext: No edema, pulses 2+ Musculoskeletal: No deformities, BUE and BLE strength normal and equal Skin: Warm and dry, no rashes   Neuro: Alert and oriented to person, place, time, and situation, CNII-XII grossly intact, no focal deficits  Psych: Normal mood and affect   ASSESSMENT:   ZADOK HOLAWAY is a 66 y.o. male who presents for the following: 1. Coronary artery disease involving native coronary artery of native heart without angina pectoris   2. Chronic systolic heart failure ([place])   3. Mixed hyperlipidemia   4. Tobacco abuse  PLAN:   1. Coronary artery disease involving native coronary artery of native heart without angina pectoris 2. Chronic systolic  heart failure ([place]) 3. Mixed hyperlipidemia -History of ischemic cardiomyopathy due to severe three-vessel CAD.  This was diagnosed in February of this year.  He has been managed medically due to metastatic cholangiocarcinoma.  His cancer is not curable.  He suffers from recurrent anemia.  He is currently on palliative chemotherapy. -His approach for his cardiovascular care is palliative.  Reports cardiomyopathy he really has limited options.  He suffers from chronic hypotension on midodrine.  We will continue this for now.  Intolerant of other medical therapy for heart failure. -Due to recurrent anemia he is not on aspirin or Plavix.  Will just continue with holding these for now.  He is on Lipitor 40 mg daily.  We need to set him up for recheck lipid profile. -He is taking Lasix as needed.  No signs of volume overload.  Weights are stable. -He will see me back in 4 months.  4. Tobacco abuse -Still smoking 1 pack/day.  3 minutes of smoking cessation counseling was provided in office today.  His wife smokes as well.  They both need to quit.  Disposition: Return in about 4 months (around 07/18/2022).  Medication Adjustments/Labs and Tests Ordered: Current medicines are reviewed at length with the patient today.  Concerns regarding medicines are outlined above.  Orders Placed This Encounter  Procedures   Lipid panel   No orders of the defined types were placed in this encounter.   Patient Instructions  Medication Instructions:  Your physician recommends that you continue on your current medications as directed. Please refer to the Current Medication list given to you today.  *If you need a refill on your cardiac medications before your next appointment, please call your pharmacy*   Lab Work: Your physician recommends that you return for lab work in: Fasting   If you have labs (blood work) drawn today and your tests are completely normal, you will receive your results only by: [place] (if you have [place]) OR A paper copy in the mail If you have any lab test that is abnormal or we need to change your treatment, we will call you to review the results.   Testing/Procedures: NONE    Follow-Up: At [facility], you and your health needs are our priority.  As part of our continuing mission to provide you with exceptional heart care, we have created designated Provider Care Teams.  These Care Teams include your primary Cardiologist (physician) and Advanced Practice Providers (APPs -  Physician Assistants and Nurse Practitioners) who all work together to provide you with the care you need, when you need it.  We recommend signing up for the patient portal called "MyChart".  Sign up information is provided on this After Visit Summary.  MyChart is used to connect with patients for Virtual Visits (Telemedicine).  Patients are able to view lab/test results, encounter notes, upcoming appointments, etc.  Non-urgent messages can be sent to your provider as well.   To learn more about what you can do with MyChart, go to [email or web address].    Your next appointment:   4 month(s)  The format for your next appointment:   In Person  Provider:   Dr. Audie Box      Other Instructions Thank you for choosing [facility]!    Your physician has requested that you regularly monitor and record your blood pressure readings at home.  Please use the same machine at the same time of day to check your readings and record them to bring to your follow-up visit.   Important Information About Sugar         Time Spent with Patient: I have spent a total of 25 minutes with patient reviewing hospital notes, telemetry, EKGs, labs and examining the patient as well as establishing an assessment and plan that was discussed with the patient.  > 50% of time was spent in direct patient care.  Signed, Addison Naegeli. Audie Box, MD, [facility]  [address],  [place], [place] [address] [phone]  03/18/2022 2:26 PM

## 2022-03-16 ENCOUNTER — Ambulatory Visit (HOSPITAL_COMMUNITY)
Admission: RE | Admit: 2022-03-16 | Discharge: 2022-03-16 | Disposition: A | Discharge status: Home / Self Care | Payer: Medicare Other | Source: Ambulatory Visit | Attending: Hematology | Admitting: Hematology

## 2022-03-16 DIAGNOSIS — C221 Intrahepatic bile duct carcinoma: Secondary | ICD-10-CM | POA: Insufficient documentation

## 2022-03-16 MED ORDER — IOHEXOL 300 MG/ML  SOLN
100.0000 mL | Freq: Once | INTRAMUSCULAR | Status: AC | PRN
  Administered 2022-03-16: 100 mL via INTRAVENOUS

## 2022-03-17 ENCOUNTER — Inpatient Hospital Stay (HOSPITAL_BASED_OUTPATIENT_CLINIC_OR_DEPARTMENT_OTHER): Payer: Medicare Other | Admitting: Hematology

## 2022-03-17 ENCOUNTER — Inpatient Hospital Stay: Payer: Medicare Other | Attending: Hematology

## 2022-03-17 ENCOUNTER — Inpatient Hospital Stay: Payer: Medicare Other

## 2022-03-17 VITALS — BP 156/81 | HR 80 | Temp 97.3°F | Resp 18

## 2022-03-17 DIAGNOSIS — Z5112 Encounter for antineoplastic immunotherapy: Secondary | ICD-10-CM | POA: Insufficient documentation

## 2022-03-17 DIAGNOSIS — I110 Hypertensive heart disease with heart failure: Secondary | ICD-10-CM | POA: Diagnosis not present

## 2022-03-17 DIAGNOSIS — Z95828 Presence of other vascular implants and grafts: Secondary | ICD-10-CM

## 2022-03-17 DIAGNOSIS — E119 Type 2 diabetes mellitus without complications: Secondary | ICD-10-CM | POA: Diagnosis not present

## 2022-03-17 DIAGNOSIS — C221 Intrahepatic bile duct carcinoma: Secondary | ICD-10-CM

## 2022-03-17 DIAGNOSIS — R42 Dizziness and giddiness: Secondary | ICD-10-CM | POA: Diagnosis not present

## 2022-03-17 DIAGNOSIS — Z7902 Long term (current) use of antithrombotics/antiplatelets: Secondary | ICD-10-CM | POA: Insufficient documentation

## 2022-03-17 DIAGNOSIS — I2510 Atherosclerotic heart disease of native coronary artery without angina pectoris: Secondary | ICD-10-CM | POA: Insufficient documentation

## 2022-03-17 DIAGNOSIS — I509 Heart failure, unspecified: Secondary | ICD-10-CM | POA: Insufficient documentation

## 2022-03-17 DIAGNOSIS — F17210 Nicotine dependence, cigarettes, uncomplicated: Secondary | ICD-10-CM | POA: Diagnosis not present

## 2022-03-17 DIAGNOSIS — R3911 Hesitancy of micturition: Secondary | ICD-10-CM | POA: Diagnosis not present

## 2022-03-17 DIAGNOSIS — R350 Frequency of micturition: Secondary | ICD-10-CM | POA: Diagnosis not present

## 2022-03-17 DIAGNOSIS — Z5111 Encounter for antineoplastic chemotherapy: Secondary | ICD-10-CM | POA: Insufficient documentation

## 2022-03-17 DIAGNOSIS — E785 Hyperlipidemia, unspecified: Secondary | ICD-10-CM | POA: Insufficient documentation

## 2022-03-17 DIAGNOSIS — G8929 Other chronic pain: Secondary | ICD-10-CM | POA: Insufficient documentation

## 2022-03-17 DIAGNOSIS — Z8249 Family history of ischemic heart disease and other diseases of the circulatory system: Secondary | ICD-10-CM | POA: Insufficient documentation

## 2022-03-17 DIAGNOSIS — Z79899 Other long term (current) drug therapy: Secondary | ICD-10-CM | POA: Insufficient documentation

## 2022-03-17 LAB — CBC WITH DIFFERENTIAL/PLATELET
Abs Immature Granulocytes: 0.03 10*3/uL (ref 0.00–0.07)
Basophils Absolute: 0 10*3/uL (ref 0.0–0.1)
Basophils Relative: 1 %
Eosinophils Absolute: 0.1 10*3/uL (ref 0.0–0.5)
Eosinophils Relative: 1 %
HCT: 32.3 % — ABNORMAL LOW (ref 39.0–52.0)
Hemoglobin: 10.9 g/dL — ABNORMAL LOW (ref 13.0–17.0)
Immature Granulocytes: 1 %
Lymphocytes Relative: 17 %
Lymphs Abs: 1 10*3/uL (ref 0.7–4.0)
MCH: 33.7 pg (ref 26.0–34.0)
MCHC: 33.7 g/dL (ref 30.0–36.0)
MCV: 100 fL (ref 80.0–100.0)
Monocytes Absolute: 0.8 10*3/uL (ref 0.1–1.0)
Monocytes Relative: 13 %
Neutro Abs: 4.1 10*3/uL (ref 1.7–7.7)
Neutrophils Relative %: 67 %
Platelets: 100 10*3/uL — ABNORMAL LOW (ref 150–400)
RBC: 3.23 MIL/uL — ABNORMAL LOW (ref 4.22–5.81)
RDW: 16.5 % — ABNORMAL HIGH (ref 11.5–15.5)
WBC: 6 10*3/uL (ref 4.0–10.5)
nRBC: 0 % (ref 0.0–0.2)

## 2022-03-17 LAB — COMPREHENSIVE METABOLIC PANEL
ALT: 47 U/L — ABNORMAL HIGH (ref 0–44)
AST: 52 U/L — ABNORMAL HIGH (ref 15–41)
Albumin: 3 g/dL — ABNORMAL LOW (ref 3.5–5.0)
Alkaline Phosphatase: 270 U/L — ABNORMAL HIGH (ref 38–126)
Anion gap: 7 (ref 5–15)
BUN: 15 mg/dL (ref 8–23)
CO2: 23 mmol/L (ref 22–32)
Calcium: 8.9 mg/dL (ref 8.9–10.3)
Chloride: 104 mmol/L (ref 98–111)
Creatinine, Ser: 1.22 mg/dL (ref 0.61–1.24)
GFR, Estimated: >60 mL/min (ref >60)
Glucose, Bld: 150 mg/dL — ABNORMAL HIGH (ref 70–99)
Potassium: 4.2 mmol/L (ref 3.5–5.1)
Sodium: 134 mmol/L — ABNORMAL LOW (ref 135–145)
Total Bilirubin: 1.7 mg/dL — ABNORMAL HIGH (ref 0.3–1.2)
Total Protein: 7 g/dL (ref 6.5–8.1)

## 2022-03-17 LAB — MAGNESIUM: Magnesium: 1.7 mg/dL (ref 1.7–2.4)

## 2022-03-17 MED ORDER — SODIUM CHLORIDE 0.9 % IV SOLN: Freq: Once | INTRAVENOUS | Status: AC

## 2022-03-17 MED ORDER — PALONOSETRON HCL INJECTION 0.25 MG/5ML
0.2500 mg | Freq: Once | INTRAVENOUS | Status: AC
  Administered 2022-03-17: 0.25 mg via INTRAVENOUS
  Filled 2022-03-17: qty 5

## 2022-03-17 MED ORDER — SODIUM CHLORIDE 0.9 % IV SOLN
400.0000 mg/m2 | Freq: Once | INTRAVENOUS | Status: AC
  Administered 2022-03-17: 760 mg via INTRAVENOUS
  Filled 2022-03-17: qty 19.99

## 2022-03-17 MED ORDER — SODIUM CHLORIDE 0.9% FLUSH
10.0000 mL | INTRAVENOUS | Status: DC | PRN
  Administered 2022-03-17: 10 mL

## 2022-03-17 MED ORDER — MAGNESIUM SULFATE 2 GM/50ML IV SOLN
2.0000 g | Freq: Once | INTRAVENOUS | Status: AC
  Administered 2022-03-17: 2 g via INTRAVENOUS
  Filled 2022-03-17: qty 50

## 2022-03-17 MED ORDER — SODIUM CHLORIDE 0.9 % IV SOLN
25.0000 mg/m2 | Freq: Once | INTRAVENOUS | Status: AC
  Administered 2022-03-17: 48 mg via INTRAVENOUS
  Filled 2022-03-17: qty 48

## 2022-03-17 MED ORDER — POTASSIUM CHLORIDE IN NACL 20-0.9 MEQ/L-% IV SOLN
Freq: Once | INTRAVENOUS | Status: AC
  Filled 2022-03-17: qty 1000

## 2022-03-17 MED ORDER — HEPARIN SOD (PORK) LOCK FLUSH 100 UNIT/ML IV SOLN
500.0000 [IU] | Freq: Once | INTRAVENOUS | Status: AC | PRN
  Administered 2022-03-17: 500 [IU]

## 2022-03-17 MED ORDER — SODIUM CHLORIDE 0.9 % IV SOLN
150.0000 mg | Freq: Once | INTRAVENOUS | Status: AC
  Administered 2022-03-17: 150 mg via INTRAVENOUS
  Filled 2022-03-17: qty 150

## 2022-03-17 MED ORDER — SODIUM CHLORIDE 0.9 % IV SOLN
10.0000 mg | Freq: Once | INTRAVENOUS | Status: AC
  Administered 2022-03-17: 10 mg via INTRAVENOUS
  Filled 2022-03-17: qty 10

## 2022-03-17 NOTE — Patient Instructions (Signed)
[facility] [place]  Discharge Instructions: Thank you for choosing [facility] to provide your oncology and hematology care.  If you have a lab appointment with the [facility], please come in thru the Main Entrance and check in at the main information desk.  Wear comfortable clothing and clothing appropriate for easy access to any Portacath or PICC line.   We strive to give you quality time with your provider. You may need to reschedule your appointment if you arrive late (15 or more minutes).  Arriving late affects you and other patients whose appointments are after yours.  Also, if you miss three or more appointments without notifying the office, you may be dismissed from the clinic at the provider's discretion.      For prescription refill requests, have your pharmacy contact our office and allow 72 hours for refills to be completed.    Today you received the following chemotherapy and/or immunotherapy agents gemzar and cisplatin.  Cisplatin Injection What is this medication? CISPLATIN (SIS pla tin) treats some types of cancer. It works by slowing down the growth of cancer cells. This medicine may be used for other purposes; ask your health care provider or pharmacist if you have questions. COMMON BRAND NAME(S): Platinol, Platinol -AQ What should I tell my care team before I take this medication? They need to know if you have any of these conditions: Eye disease, vision problems Hearing problems Kidney disease Low blood counts, such as low white cells, platelets, or red blood cells Tingling of the fingers or toes, or other nerve disorder An unusual or allergic reaction to cisplatin, carboplatin, oxaliplatin, other medications, foods, dyes, or preservatives If you or your partner are pregnant or trying to get pregnant Breast-feeding How should I use this medication? This medication is injected into a vein. It is given by your care team in a hospital or  clinic setting. Talk to your care team about the use of this medication in children. Special care may be needed. Overdosage: If you think you have taken too much of this medicine contact a poison control center or emergency room at once. NOTE: This medicine is only for you. Do not share this medicine with others. What if I miss a dose? Keep appointments for follow-up doses. It is important not to miss your dose. Call your care team if you are unable to keep an appointment. What may interact with this medication? Do not take this medication with any of the following: Live virus vaccines This medication may also interact with the following: Certain antibiotics, such as amikacin, gentamicin, neomycin, polymyxin B, streptomycin, tobramycin, vancomycin Foscarnet This list may not describe all possible interactions. Give your health care provider a list of all the medicines, herbs, non-prescription drugs, or dietary supplements you use. Also tell them if you smoke, drink alcohol, or use illegal drugs. Some items may interact with your medicine. What should I watch for while using this medication? Your condition will be monitored carefully while you are receiving this medication. You may need blood work done while taking this medication. This medication may make you feel generally unwell. This is not uncommon, as chemotherapy can affect healthy cells as well as cancer cells. Report any side effects. Continue your course of treatment even though you feel ill unless your care team tells you to stop. This medication may increase your risk of getting an infection. Call your care team for advice if you get a fever, chills, sore throat, or  other symptoms of a cold or flu. Do not treat yourself. Try to avoid being around people who are sick. Avoid taking medications that contain aspirin, acetaminophen, ibuprofen, naproxen, or ketoprofen unless instructed by your care team. These medications may hide a  fever. This medication may increase your risk to bruise or bleed. Call your care team if you notice any unusual bleeding. Be careful brushing or flossing your teeth or using a toothpick because you may get an infection or bleed more easily. If you have any dental work done, tell your dentist you are receiving this medication. Drink fluids as directed while you are taking this medication. This will help protect your kidneys. Call your care team if you get diarrhea. Do not treat yourself. Talk to your care team if you or your partner wish to become pregnant or think you might be pregnant. This medication can cause serious birth defects if taken during pregnancy and for 14 months after the last dose. A negative pregnancy test is required before starting this medication. A reliable form of contraception is recommended while taking this medication and for 14 months after the last dose. Talk to your care team about effective forms of contraception. Do not father a child while taking this medication and for 11 months after the last dose. Use a condom during sex during this time period. Do not breast-feed while taking this medication. This medication may cause infertility. Talk to your care team if you are concerned about your fertility. What side effects may I notice from receiving this medication? Side effects that you should report to your care team as soon as possible: Allergic reactions--skin rash, itching, hives, swelling of the face, lips, tongue, or throat Eye pain, change in vision, vision loss Hearing loss, ringing in ears Infection--fever, chills, cough, sore throat, wounds that don't heal, pain or trouble when passing urine, general feeling of discomfort or being unwell Kidney injury--decrease in the amount of urine, swelling of the ankles, hands, or feet Low red blood cell level--unusual weakness or fatigue, dizziness, headache, trouble breathing Painful swelling, warmth, or redness of the skin,  blisters or sores at the infusion site Pain, tingling, or numbness in the hands or feet Unusual bruising or bleeding Side effects that usually do not require medical attention (report to your care team if they continue or are bothersome): Hair loss Nausea Vomiting This list may not describe all possible side effects. Call your doctor for medical advice about side effects. You may report side effects to FDA at 1-800-FDA-1088. Where should I keep my medication? This medication is given in a hospital or clinic. It will not be stored at home. NOTE: This sheet is a summary. It may not cover all possible information. If you have questions about this medicine, talk to your doctor, pharmacist, or health care provider.  2023 Elsevier/Gold Standard (2021-11-24 00:00:00) Gemcitabine Injection What is this medication? GEMCITABINE (jem SYE ta been) treats some types of cancer. It works by slowing down the growth of cancer cells. This medicine may be used for other purposes; ask your health care provider or pharmacist if you have questions. COMMON BRAND NAME(S): Gemzar, Infugem What should I tell my care team before I take this medication? They need to know if you have any of these conditions: Blood disorders Infection Kidney disease Liver disease Lung or breathing disease, such as asthma or COPD Recent or ongoing radiation therapy An unusual or allergic reaction to gemcitabine, other medications, foods, dyes, or preservatives If you or  your partner are pregnant or trying to get pregnant Breast-feeding How should I use this medication? This medication is injected into a vein. It is given by your care team in a hospital or clinic setting. Talk to your care team about the use of this medication in children. Special care may be needed. Overdosage: If you think you have taken too much of this medicine contact a poison control center or emergency room at once. NOTE: This medicine is only for you. Do  not share this medicine with others. What if I miss a dose? Keep appointments for follow-up doses. It is important not to miss your dose. Call your care team if you are unable to keep an appointment. What may interact with this medication? Interactions have not been studied. This list may not describe all possible interactions. Give your health care provider a list of all the medicines, herbs, non-prescription drugs, or dietary supplements you use. Also tell them if you smoke, drink alcohol, or use illegal drugs. Some items may interact with your medicine. What should I watch for while using this medication? Your condition will be monitored carefully while you are receiving this medication. This medication may make you feel generally unwell. This is not uncommon, as chemotherapy can affect healthy cells as well as cancer cells. Report any side effects. Continue your course of treatment even though you feel ill unless your care team tells you to stop. In some cases, you may be given additional medications to help with side effects. Follow all directions for their use. This medication may increase your risk of getting an infection. Call your care team for advice if you get a fever, chills, sore throat, or other symptoms of a cold or flu. Do not treat yourself. Try to avoid being around people who are sick. This medication may increase your risk to bruise or bleed. Call your care team if you notice any unusual bleeding. Be careful brushing or flossing your teeth or using a toothpick because you may get an infection or bleed more easily. If you have any dental work done, tell your dentist you are receiving this medication. Avoid taking medications that contain aspirin, acetaminophen, ibuprofen, naproxen, or ketoprofen unless instructed by your care team. These medications may hide a fever. Talk to your care team if you or your partner wish to become pregnant or think you might be pregnant. This medication  can cause serious birth defects if taken during pregnancy and for 6 months after the last dose. A negative pregnancy test is required before starting this medication. A reliable form of contraception is recommended while taking this medication and for 6 months after the last dose. Talk to your care team about effective forms of contraception. Do not father a child while taking this medication and for 3 months after the last dose. Use a condom while having sex during this time period. Do not breastfeed while taking this medication and for at least 1 week after the last dose. This medication may cause infertility. Talk to your care team if you are concerned about your fertility. What side effects may I notice from receiving this medication? Side effects that you should report to your care team as soon as possible: Allergic reactions--skin rash, itching, hives, swelling of the face, lips, tongue, or throat Capillary leak syndrome--stomach or muscle pain, unusual weakness or fatigue, feeling faint or lightheaded, decrease in the amount of urine, swelling of the ankles, hands, or feet, trouble breathing Infection--fever, chills, cough, sore throat,  wounds that don't heal, pain or trouble when passing urine, general feeling of discomfort or being unwell Liver injury--right upper belly pain, loss of appetite, nausea, light-colored stool, dark yellow or brown urine, yellowing skin or eyes, unusual weakness or fatigue Low red blood cell level--unusual weakness or fatigue, dizziness, headache, trouble breathing Lung injury--shortness of breath or trouble breathing, cough, spitting up blood, chest pain, fever Stomach pain, bloody diarrhea, pale skin, unusual weakness or fatigue, decrease in the amount of urine, which may be signs of hemolytic uremic syndrome Sudden and severe headache, confusion, change in vision, seizures, which may be signs of posterior reversible encephalopathy syndrome (PRES) Unusual bruising  or bleeding Side effects that usually do not require medical attention (report to your care team if they continue or are bothersome): Diarrhea Drowsiness Hair loss Nausea Pain, redness, or swelling with sores inside the mouth or throat Vomiting This list may not describe all possible side effects. Call your doctor for medical advice about side effects. You may report side effects to FDA at 1-800-FDA-1088. Where should I keep my medication? This medication is given in a hospital or clinic. It will not be stored at home. NOTE: This sheet is a summary. It may not cover all possible information. If you have questions about this medicine, talk to your doctor, pharmacist, or health care provider.  2023 Elsevier/Gold Standard (2021-11-26 00:00:00)       To help prevent nausea and vomiting after your treatment, we encourage you to take your nausea medication as directed.  BELOW ARE SYMPTOMS THAT SHOULD BE REPORTED IMMEDIATELY: *FEVER GREATER THAN 100.4 F (38 C) OR HIGHER *CHILLS OR SWEATING *NAUSEA AND VOMITING THAT IS NOT CONTROLLED WITH YOUR NAUSEA MEDICATION *UNUSUAL SHORTNESS OF BREATH *UNUSUAL BRUISING OR BLEEDING *URINARY PROBLEMS (pain or burning when urinating, or frequent urination) *BOWEL PROBLEMS (unusual diarrhea, constipation, pain near the anus) TENDERNESS IN MOUTH AND THROAT WITH OR WITHOUT PRESENCE OF ULCERS (sore throat, sores in mouth, or a toothache) UNUSUAL RASH, SWELLING OR PAIN  UNUSUAL VAGINAL DISCHARGE OR ITCHING   Items with * indicate a potential emergency and should be followed up as soon as possible or go to the Emergency Department if any problems should occur.  Please show the CHEMOTHERAPY ALERT CARD or IMMUNOTHERAPY ALERT CARD at check-in to the Emergency Department and triage nurse.  Should you have questions after your visit or need to cancel or reschedule your appointment, please contact [facility] [phone]  and follow the  prompts.  Office hours are 8:00 a.m. to 4:30 p.m. Monday - Friday. Please note that voicemails left after 4:00 p.m. may not be returned until the following business day.  We are closed weekends and major holidays. You have access to a nurse at all times for urgent questions. Please call the main number to the clinic [phone] and follow the prompts.  For any non-urgent questions, you may also contact your provider using MyChart. We now offer e-Visits for anyone 41 and older to request care online for non-urgent symptoms. For details visit [email or web address].   Also download the MyChart app! Go to the app store, search "MyChart", open the app, select [facility], and log in with your MyChart username and password.  Masks are optional in the cancer centers. If you would like for your care team to wear a mask while they are taking care of you, please let them know. For doctor visits, patients may have with them one support person who is at least 66  years old. At this time, visitors are not allowed in the infusion area.

## 2022-03-17 NOTE — Patient Instructions (Addendum)
[facility] at [facility] Discharge Instructions   You were seen and examined today by Dr. Delton Coombes.  He reviewed the results of your lab work which are normal/stable.   He reviewed the results of your CT scan which is stable.   We will proceed with your treatment today.   Return as scheduled in 2 weeks.    Thank you for choosing [facility] at [facility] to provide your oncology and hematology care.  To afford each patient quality time with our provider, please arrive at least 15 minutes before your scheduled appointment time.   If you have a lab appointment with the [facility] please come in thru the Main Entrance and check in at the main information desk.  You need to re-schedule your appointment should you arrive 10 or more minutes late.  We strive to give you quality time with our providers, and arriving late affects you and other patients whose appointments are after yours.  Also, if you no show three or more times for appointments you may be dismissed from the clinic at the providers discretion.     Again, thank you for choosing [facility].  Our hope is that these requests will decrease the amount of time that you wait before being seen by our physicians.       _____________________________________________________________  Should you have questions after your visit to [facility], please contact our office at [phone] and follow the prompts.  Our office hours are 8:00 a.m. and 4:30 p.m. Monday - Friday.  Please note that voicemails left after 4:00 p.m. may not be returned until the following business day.  We are closed weekends and major holidays.  You do have access to a nurse 24-7, just call the main number to the clinic [phone] and do not press any options, hold on the line and a nurse will answer the phone.    For prescription refill requests, have your pharmacy contact our office and allow  72 hours.    Due to Covid, you will need to wear a mask upon entering the hospital. If you do not have a mask, a mask will be given to you at the Main Entrance upon arrival. For doctor visits, patients may have 1 support person age 57 or older with them. For treatment visits, patients can not have anyone with them due to social distancing guidelines and our immunocompromised population.

## 2022-03-17 NOTE — Progress Notes (Signed)
Ok to treat with elevated bilirubin.  Gemzar dose reduced.  T.O. Dr Rhys Martini, PharmD

## 2022-03-17 NOTE — Progress Notes (Signed)
Patient tolerated chemotherapy with no complaints voiced.  Side effects with management reviewed with understanding verbalized.  Port site clean and dry with no bruising or swelling noted at site.  Good blood return noted before and after administration of chemotherapy.  Band aid applied.  Patient left in satisfactory condition with VSS and no s/s of distress noted.   

## 2022-03-17 NOTE — Progress Notes (Signed)
Jonathan Keith, [place] [address]   CLINIC:  Medical Oncology/Hematology  PCP:  [name], PA-C [address] / [place] [address] [phone]   REASON FOR VISIT:  Follow-up for intrahepatic cholangiocarcinoma  PRIOR THERAPY: none  NGS Results: not done  CURRENT THERAPY: Cisplatin + Gemcitabine D1, D15 q 28d  BRIEF ONCOLOGIC HISTORY:  Oncology History  Cholangiocarcinoma ([facility])  02/28/2021 Initial Diagnosis   Cholangiocarcinoma ([facility])   07/17/2021 Cancer Staging   Staging form: Perihilar Bile Ducts, AJCC 8th Edition - Clinical stage from 07/17/2021: Stage IVB (cTX, cNX, pM1) - Signed by Derek Jack, MD on 07/17/2021 Stage prefix: Initial diagnosis   07/22/2021 -  Chemotherapy   Patient is on Treatment Plan : BILIARY TRACT Cisplatin + Gemcitabine D1,15 + Imfinzi day 1 q28d       CANCER STAGING:  Cancer Staging  Cholangiocarcinoma ([facility]) Staging form: Perihilar Bile Ducts, AJCC 8th Edition - Clinical stage from 07/17/2021: Stage IVB (cTX, cNX, pM1) - Signed by Derek Jack, MD on 07/17/2021   INTERVAL HISTORY:  Jonathan Keith, a 66 y.o. male, is seen prior to next cycle of chemotherapy.  He reported some urinary frequency and hesitancy.  He also reported occasional dizziness when he stands up suddenly.  Appetite is 75%.  Energy levels are 25%.  No major GI side effects noted.   REVIEW OF SYSTEMS:  Review of Systems  Constitutional:  Negative for appetite change (improved), fatigue (improved) and unexpected weight change (+2 lbs).  HENT:   Negative for tinnitus.   Cardiovascular:  Negative for leg swelling.  Gastrointestinal:  Negative for diarrhea.  Genitourinary:  Positive for frequency.   Skin:  Negative for rash.  Neurological:  Positive for dizziness. Negative for numbness.  All other systems reviewed and are negative.   PAST MEDICAL/SURGICAL HISTORY:  Past Medical History:  Diagnosis Date    Cancer ([facility])    CHF (congestive heart failure) (HCC)    Chronic pain    neck, back, knees   Class 1 obesity due to excess calories with body mass index (BMI) of 31.0 to 31.9 in adult    Claudication [facility])    Coronary artery disease    DDD (degenerative disc disease), cervical    Diabetes mellitus ([facility])    Dyslipidemia    Hypertension    Port-A-Cath in place 07/20/2021   Tobacco dependence    Past Surgical History:  Procedure Laterality Date   BILIARY BRUSHING N/A 02/25/2021   Procedure: BILIARY BRUSHING;  Surgeon: Rogene Houston, MD;  Location: [facility];  Service: Gastroenterology;  Laterality: N/A;   BILIARY STENT PLACEMENT N/A 02/25/2021   Procedure: BILIARY STENT PLACEMENT;  Surgeon: Rogene Houston, MD;  Location: [facility];  Service: Gastroenterology;  Laterality: N/A;   BILIARY STENT PLACEMENT  02/27/2021   Procedure: BILIARY STENT PLACEMENT (10FR x 9cm) IN THE RIGHT SYSTEM;  Surgeon: Rogene Houston, MD;  Location: [facility];  Service: Gastroenterology;;   BREAST SURGERY Left    benign lump- in his 47s.   CARDIAC CATHETERIZATION     ERCP N/A 02/25/2021   Procedure: ENDOSCOPIC RETROGRADE CHOLANGIOPANCREATOGRAPHY (ERCP);  Surgeon: Rogene Houston, MD;  Location: [facility];  Service: Gastroenterology;  Laterality: N/A;   ERCP N/A 02/27/2021   Procedure: ENDOSCOPIC RETROGRADE CHOLANGIOPANCREATOGRAPHY (ERCP);  Surgeon: Rogene Houston, MD;  Location: [facility];  Service: Gastroenterology;  Laterality: N/A;   IR IMAGING GUIDED PORT INSERTION  07/21/2021   KNEE  SURGERY Left    x2   LEFT HEART CATH AND CORONARY ANGIOGRAPHY N/A 09/08/2021   Procedure: LEFT HEART CATH AND CORONARY ANGIOGRAPHY;  Surgeon: Early Osmond, MD;  Location: [place] [facility] CV LAB;  Service: Cardiovascular;  Laterality: N/A;   SPHINCTEROTOMY N/A 02/25/2021   Procedure: SPHINCTEROTOMY;  Surgeon: Rogene Houston, MD;  Location: [facility];  Service: Gastroenterology;  Laterality: N/A;   STENT REMOVAL  02/27/2021    Procedure: STENT REMOVAL (8.5Fr x 9cm);  Surgeon: Rogene Houston, MD;  Location: [facility];  Service: Gastroenterology;;    SOCIAL HISTORY:  Social History   Socioeconomic History   Marital status: Married    Spouse name: Not on file   Number of children: Not on file   Years of education: Not on file   Highest education level: Not on file  Occupational History   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  Tobacco Use   Smoking status: Every Day    Packs/day: 0.50    Years: 56.00    Total pack years: 28.00    Types: Cigarettes   Smokeless tobacco: Never  Vaping Use   Vaping Use: Never used  Substance and Sexual Activity   Alcohol use: Not Currently    Comment: stopped 2.5 years ago 03/11/21   Drug use: Never   Sexual activity: Not on file  Other Topics Concern   Not on file  Social History Narrative   Not on file   Social Determinants of Health   Financial Resource Strain: Medium Risk (03/07/2021)   Overall Financial Resource Strain (CARDIA)    Difficulty of Paying Living Expenses: Somewhat hard  Food Insecurity: No Food Insecurity (03/07/2021)   Hunger Vital Sign    Worried About Running Out of Food in the Last Year: Never true    [place] in the Last Year: Never true  Transportation Needs: No Transportation Needs (03/07/2021)   PRAPARE - Hydrologist (Medical): No    Lack of Transportation (Non-Medical): No  Physical Activity: Insufficiently Active (03/07/2021)   Exercise Vital Sign    Days of Exercise per Week: 2 days    Minutes of Exercise per Session: 20 min  Stress: No Stress Concern Present (03/07/2021)   [place]    Feeling of Stress : Only a little  Social Connections: Moderately Integrated (03/07/2021)   Social Connection and Isolation Panel [NHANES]    Frequency of Communication with Friends and Family: More than three times a week    Frequency of Social Gatherings  with Friends and Family: More than three times a week    Attends Religious Services: More than 4 times per year    Active Member of [employer] or Organizations: No    Attends Archivist Meetings: Never    Marital Status: Married  Human resources officer Violence: Not At Risk (03/07/2021)   Humiliation, Afraid, Rape, and Kick questionnaire    Fear of Current or Ex-Partner: No    Emotionally Abused: No    Physically Abused: No    Sexually Abused: No    FAMILY HISTORY:  Family History  Problem Relation Age of Onset   CAD Mother    Cancer Neg Hx     CURRENT MEDICATIONS:  Current Outpatient Medications  Medication Sig Dispense Refill   acetaminophen (TYLENOL) 500 MG tablet Take 500 mg by mouth every 6 (six) hours as needed for moderate pain or headache.  albuterol (VENTOLIN HFA) 108 (90 Base) MCG/ACT inhaler Inhale 2 puffs into the lungs every 6 (six) hours as needed for wheezing or shortness of breath. 6.7 g 1   aspirin EC 81 MG tablet Take 81 mg by mouth daily. Swallow whole.     atorvastatin (LIPITOR) 40 MG tablet Take 1 tablet (40 mg total) by mouth daily at 6 PM. 90 tablet 3   CISPLATIN IV Inject into the vein once a week. Days 1, 8 every 21 days     clopidogrel (PLAVIX) 75 MG tablet Take 1 tablet (75 mg total) by mouth daily with breakfast. 90 tablet 3   Durvalumab (IMFINZI IV) Inject into the vein every 21 ( twenty-one) days.     famotidine (PEPCID) 20 MG tablet Take 20 mg by mouth daily.     ferrous sulfate (FERROUSUL) 325 (65 FE) MG tablet Take 1 tablet (325 mg total) by mouth daily with breakfast.     furosemide (LASIX) 20 MG tablet Take 20 mg daily as needed for swelling 90 tablet 1   Gemcitabine HCl (GEMZAR IV) Inject into the vein once a week. Days 1, 8 every 21 days     Ginseng 100 MG CAPS Take 1 capsule by mouth daily.     lidocaine-prilocaine (EMLA) cream Apply a small amount to port a cath site and cover with plastic wrap 1 hour prior to chemotherapy appointments  (Patient not taking: Reported on 02/11/2022) 30 g 3   loratadine (CLARITIN) 10 MG tablet Take 10 mg by mouth daily. Takes for a few days after shot to boost WBC. About every 2nd treatment     metFORMIN (GLUCOPHAGE) 500 MG tablet Take 1 tablet (500 mg total) by mouth 2 (two) times daily with a meal. 60 tablet 0   midodrine (PROAMATINE) 5 MG tablet Take 1 tablet (5 mg total) by mouth 3 (three) times daily with meals. 270 tablet 3   nitroGLYCERIN (NITROSTAT) 0.4 MG SL tablet Place 1 tablet (0.4 mg total) under the tongue every 5 (five) minutes as needed for chest pain. (Patient not taking: Reported on 02/11/2022) 25 tablet 3   pantoprazole (PROTONIX) 40 MG tablet Take 1 tablet (40 mg total) by mouth daily. 90 tablet 3   prochlorperazine (COMPAZINE) 10 MG tablet TAKE (1) TABLET BY MOUTH EVERY (6) HOURS AS NEEDED. 90 tablet 3   tamsulosin (FLOMAX) 0.4 MG CAPS capsule TAKE (2) CAPSULES BY MOUTH AT BEDTIME. 60 capsule 0   No current facility-administered medications for this visit.    ALLERGIES:  No Known Allergies  PHYSICAL EXAM:  Performance status (ECOG): 0 - Asymptomatic  There were no vitals filed for this visit. Wt Readings from Last 3 Encounters:  03/17/22 155 lb (70.3 kg)  03/03/22 155 lb (70.3 kg)  02/11/22 153 lb 12.8 oz (69.8 kg)   Physical Exam Vitals reviewed.  Constitutional:      Appearance: Normal appearance.  Cardiovascular:     Rate and Rhythm: Normal rate and regular rhythm.     Pulses: Normal pulses.     Heart sounds: Normal heart sounds.  Pulmonary:     Effort: Pulmonary effort is normal.     Breath sounds: Normal breath sounds.  Abdominal:     Palpations: Abdomen is soft. There is no mass.     Tenderness: There is no abdominal tenderness.  Musculoskeletal:     Right lower leg: No edema.     Left lower leg: No edema.  Neurological:     General: No  focal deficit present.     Mental Status: He is alert and oriented to person, place, and time.  Psychiatric:         Mood and Affect: Mood normal.        Behavior: Behavior normal.     LABORATORY DATA:  I have reviewed the labs as listed.     Latest Ref Rng & Units 03/03/2022    8:06 AM 02/11/2022    7:53 AM 01/27/2022    7:42 AM  CBC  WBC 4.0 - 10.5 K/uL 4.2  4.7  4.3   Hemoglobin 13.0 - 17.0 g/dL 11.1  11.3  11.2   Hematocrit 39.0 - 52.0 % 33.2  32.7  33.3   Platelets 150 - 400 K/uL 114  110  88       Latest Ref Rng & Units 03/03/2022    8:06 AM 02/11/2022    7:53 AM 01/27/2022    7:42 AM  CMP  Glucose 70 - 99 mg/dL 201  178  169   BUN 8 - 23 mg/dL '13  13  16   '$ Creatinine 0.61 - 1.24 mg/dL 1.07  1.03  1.05   Sodium 135 - 145 mmol/L 133  133  135   Potassium 3.5 - 5.1 mmol/L 3.8  4.3  4.2   Ch[place]ide 98 - 111 mmol/L 105  101  105   CO2 22 - 32 mmol/L '22  24  24   '$ Calcium 8.9 - 10.3 mg/dL 8.8  9.0  9.0   Total Protein 6.5 - 8.1 g/dL 6.7  7.0  6.7   Total Bilirubin 0.3 - 1.2 mg/dL 1.3  1.1  1.1   Alkaline Phos 38 - 126 U/L 274  299  298   AST 15 - 41 U/L 58  49  48   ALT 0 - 44 U/L 49  46  43     DIAGNOSTIC IMAGING:  I have independently reviewed the scans and discussed with the patient. No results found.   ASSESSMENT:  Metastatic cholangiocarcinoma: - 02/2021 initial diagnosis presentation with painless jaundice - ERCP with sphincterotomy, subsequent ERCP with plastic biliary stent placement. - MRI-2 cm mass involving the common hepatic duct with upstream biliary dilatation. - 03/2021 consult at [facility] with GI and surgery-confirm diagnosis of perihilar cholangiocarcinoma - 03/21/2021-biopsy/pathology, ERCP-common hepatic duct cytology and brushings performed.  EUS-1 enlarged lymph node versus tumor implant in the gastrohepatic ligament-negative for malignancy.  Single biliary plastic stent removed and replaced with the plastic stent in the right and left ducts. - 04/17/2021-right portal vein embolization to promote left hepatic hypertrophy. - 07/02/2021-diagnostic laparoscopy with multiple areas  concerning for metastatic blocks identified in the abdomen and peritoneal surfaces.  6 separate biopsies obtained. - Pathology consistent with metastatic carcinoma. - 37 pound weight loss in the last 5 months. - Cycle 1 of gemcitabine, cisplatin, durvalumab on 07/22/2021.  2.  Social history/family: - Lives at home with his wife.  He worked as a Development worker, community. - Current active smoker, 1 pack/day since age 89. - Maternal grandmother had cancer.  Maternal uncle had brain tumor.   PLAN:  Metastatic cholangiocarcinoma: - CT CAP (03/16/2022): Stable exam with no clear evidence of metastatic disease in the chest, abdomen or pelvis.  Mild to moderate intrahepatic biliary ductal dilatation is unchanged. - Last CA 19-9 was 299.  He is tolerating chemotherapy very well. - Reviewed labs today which showed elevated AST and ALT which are stable.  Total bilirubin is 1.7.  Rest of the renal function panel was within normal limits. - He will continue maintenance gemcitabine and cisplatin every 2 weeks with Durvalumab on day 1. - We will decrease the gemcitabine to 400 mg/m2 as he is having some fatigue after each treatment.  If the fatigue does not improve, will consider decreasing cisplatin dose at next visit. - RTC 4 weeks for follow-up.  2.  Weight loss: - Weight has been stable.  He is eating well.  3.  Urinary problems: - Continue Flomax 0.8 mg daily.  I have asked him to increase his water intake.  4.  Hypotension: - Continue midodrine 5 mg 3 times daily.  Blood pressure today is 135/87.   Orders placed this encounter:  No orders of the defined types were placed in this encounter.    Derek Jack, MD [place] [phone]   I, Thana Ates, am acting as a scribe for Dr. Derek Jack.  I, Derek Jack MD, have reviewed the above documentation for accuracy and completeness, and I agree with the above.

## 2022-03-18 ENCOUNTER — Encounter: Payer: Self-pay | Admitting: Cardiovascular Disease

## 2022-03-18 ENCOUNTER — Ambulatory Visit (INDEPENDENT_AMBULATORY_CARE_PROVIDER_SITE_OTHER): Payer: Medicare Other | Admitting: Cardiovascular Disease

## 2022-03-18 ENCOUNTER — Other Ambulatory Visit: Payer: Self-pay

## 2022-03-18 VITALS — BP 130/74 | HR 84 | Ht 71.0 in | Wt 159.0 lb

## 2022-03-18 DIAGNOSIS — I2510 Atherosclerotic heart disease of native coronary artery without angina pectoris: Secondary | ICD-10-CM | POA: Diagnosis not present

## 2022-03-18 DIAGNOSIS — I5022 Chronic systolic (congestive) heart failure: Secondary | ICD-10-CM

## 2022-03-18 DIAGNOSIS — E782 Mixed hyperlipidemia: Secondary | ICD-10-CM

## 2022-03-18 DIAGNOSIS — Z720 Tobacco use: Secondary | ICD-10-CM

## 2022-03-18 DIAGNOSIS — F17210 Nicotine dependence, cigarettes, uncomplicated: Secondary | ICD-10-CM | POA: Diagnosis not present

## 2022-03-18 NOTE — Patient Instructions (Signed)
Medication Instructions:  Your physician recommends that you continue on your current medications as directed. Please refer to the Current Medication list given to you today.  *If you need a refill on your cardiac medications before your next appointment, please call your pharmacy*   Lab Work: Your physician recommends that you return for lab work in: Fasting   If you have labs (blood work) drawn today and your tests are completely normal, you will receive your results only by: [facility] (if you have [place]) OR A paper copy in the mail If you have any lab test that is abnormal or we need to change your treatment, we will call you to review the results.   Testing/Procedures: NONE    Follow-Up: At [facility], you and your health needs are our priority.  As part of our continuing mission to provide you with exceptional heart care, we have created designated Provider Care Teams.  These Care Teams include your primary Cardiologist (physician) and Advanced Practice Providers (APPs -  Physician Assistants and Nurse Practitioners) who all work together to provide you with the care you need, when you need it.  We recommend signing up for the patient portal called "MyChart".  Sign up information is provided on this After Visit Summary.  MyChart is used to connect with patients for Virtual Visits (Telemedicine).  Patients are able to view lab/test results, encounter notes, upcoming appointments, etc.  Non-urgent messages can be sent to your provider as well.   To learn more about what you can do with MyChart, go to [email or web address].    Your next appointment:   4 month(s)  The format for your next appointment:   In Person  Provider:   Dr. Audie Box      Other Instructions Thank you for choosing [facility]!    Your physician has requested that you regularly monitor and record your blood pressure readings at home. Please use the same machine at the same time of day  to check your readings and record them to bring to your follow-up visit.   Important Information About Sugar

## 2022-03-19 ENCOUNTER — Other Ambulatory Visit: Payer: Self-pay

## 2022-03-19 ENCOUNTER — Telehealth: Payer: Self-pay | Admitting: Cardiovascular Disease

## 2022-03-19 NOTE — Telephone Encounter (Signed)
Pt wife called wanting to inform Dr. Audie Box that pt has been taking clopidogrel.  She also wants to know if she should give the pt his fluid pill before or after chemo

## 2022-03-19 NOTE — Telephone Encounter (Signed)
Spouse informed of reply from Dr. Audie Box..Marland Kitchen"OK with lasix before chemo. OK to continue plavix." Spouse appreciated my call.

## 2022-03-19 NOTE — Telephone Encounter (Signed)
Wife stated patient has been taking Plavix '75mg'$  since April 2023. He has no signs of bleeding but bruises easily. Wife asked if patient could take lasix in the morning on the days of chemo, because he swells afterward and she doesn't want him up all night voiding. PharmD Pavero approved lasix before chemo.

## 2022-04-01 ENCOUNTER — Other Ambulatory Visit: Payer: Self-pay | Admitting: *Deleted

## 2022-04-01 ENCOUNTER — Inpatient Hospital Stay: Payer: Medicare Other

## 2022-04-01 VITALS — BP 149/79 | HR 82 | Temp 96.7°F | Resp 18

## 2022-04-01 DIAGNOSIS — Z79899 Other long term (current) drug therapy: Secondary | ICD-10-CM

## 2022-04-01 DIAGNOSIS — Z95828 Presence of other vascular implants and grafts: Secondary | ICD-10-CM

## 2022-04-01 DIAGNOSIS — Z5112 Encounter for antineoplastic immunotherapy: Secondary | ICD-10-CM | POA: Diagnosis not present

## 2022-04-01 DIAGNOSIS — C221 Intrahepatic bile duct carcinoma: Secondary | ICD-10-CM

## 2022-04-01 LAB — COMPREHENSIVE METABOLIC PANEL
ALT: 64 U/L — ABNORMAL HIGH (ref 0–44)
AST: 77 U/L — ABNORMAL HIGH (ref 15–41)
Albumin: 2.8 g/dL — ABNORMAL LOW (ref 3.5–5.0)
Alkaline Phosphatase: 328 U/L — ABNORMAL HIGH (ref 38–126)
Anion gap: 6 (ref 5–15)
BUN: 14 mg/dL (ref 8–23)
CO2: 23 mmol/L (ref 22–32)
Calcium: 8.7 mg/dL — ABNORMAL LOW (ref 8.9–10.3)
Chloride: 102 mmol/L (ref 98–111)
Creatinine, Ser: 1.28 mg/dL — ABNORMAL HIGH (ref 0.61–1.24)
GFR, Estimated: >60 mL/min (ref >60)
Glucose, Bld: 190 mg/dL — ABNORMAL HIGH (ref 70–99)
Potassium: 4.2 mmol/L (ref 3.5–5.1)
Sodium: 131 mmol/L — ABNORMAL LOW (ref 135–145)
Total Bilirubin: 2.5 mg/dL — ABNORMAL HIGH (ref 0.3–1.2)
Total Protein: 6.9 g/dL (ref 6.5–8.1)

## 2022-04-01 LAB — CBC WITH DIFFERENTIAL/PLATELET
Abs Immature Granulocytes: 0.02 10*3/uL (ref 0.00–0.07)
Basophils Absolute: 0 10*3/uL (ref 0.0–0.1)
Basophils Relative: 1 %
Eosinophils Absolute: 0.1 10*3/uL (ref 0.0–0.5)
Eosinophils Relative: 2 %
HCT: 31.1 % — ABNORMAL LOW (ref 39.0–52.0)
Hemoglobin: 10.3 g/dL — ABNORMAL LOW (ref 13.0–17.0)
Immature Granulocytes: 0 %
Lymphocytes Relative: 19 %
Lymphs Abs: 1 10*3/uL (ref 0.7–4.0)
MCH: 34.1 pg — ABNORMAL HIGH (ref 26.0–34.0)
MCHC: 33.1 g/dL (ref 30.0–36.0)
MCV: 103 fL — ABNORMAL HIGH (ref 80.0–100.0)
Monocytes Absolute: 0.7 10*3/uL (ref 0.1–1.0)
Monocytes Relative: 12 %
Neutro Abs: 3.5 10*3/uL (ref 1.7–7.7)
Neutrophils Relative %: 66 %
Platelets: 112 10*3/uL — ABNORMAL LOW (ref 150–400)
RBC: 3.02 MIL/uL — ABNORMAL LOW (ref 4.22–5.81)
RDW: 16.7 % — ABNORMAL HIGH (ref 11.5–15.5)
WBC: 5.3 10*3/uL (ref 4.0–10.5)
nRBC: 0 % (ref 0.0–0.2)

## 2022-04-01 LAB — TSH: TSH: 0.659 u[IU]/mL (ref 0.350–4.500)

## 2022-04-01 LAB — MAGNESIUM: Magnesium: 1.9 mg/dL (ref 1.7–2.4)

## 2022-04-01 MED ORDER — SODIUM CHLORIDE 0.9 % IV SOLN
10.0000 mg | Freq: Once | INTRAVENOUS | Status: AC
  Administered 2022-04-01: 10 mg via INTRAVENOUS
  Filled 2022-04-01: qty 10

## 2022-04-01 MED ORDER — HEPARIN SOD (PORK) LOCK FLUSH 100 UNIT/ML IV SOLN
500.0000 [IU] | Freq: Once | INTRAVENOUS | Status: AC | PRN
  Administered 2022-04-01: 500 [IU]

## 2022-04-01 MED ORDER — PALONOSETRON HCL INJECTION 0.25 MG/5ML
0.2500 mg | Freq: Once | INTRAVENOUS | Status: AC
  Administered 2022-04-01: 0.25 mg via INTRAVENOUS
  Filled 2022-04-01: qty 5

## 2022-04-01 MED ORDER — SODIUM CHLORIDE 0.9 % IV SOLN
150.0000 mg | Freq: Once | INTRAVENOUS | Status: AC
  Administered 2022-04-01: 150 mg via INTRAVENOUS
  Filled 2022-04-01: qty 150

## 2022-04-01 MED ORDER — POTASSIUM CHLORIDE IN NACL 20-0.9 MEQ/L-% IV SOLN
Freq: Once | INTRAVENOUS | Status: AC
  Filled 2022-04-01: qty 1000

## 2022-04-01 MED ORDER — SODIUM CHLORIDE 0.9% FLUSH
10.0000 mL | INTRAVENOUS | Status: DC | PRN
  Administered 2022-04-01: 10 mL

## 2022-04-01 MED ORDER — SODIUM CHLORIDE 0.9 % IV SOLN: Freq: Once | INTRAVENOUS | Status: AC

## 2022-04-01 MED ORDER — SODIUM CHLORIDE 0.9 % IV SOLN
1500.0000 mg | Freq: Once | INTRAVENOUS | Status: AC
  Administered 2022-04-01: 1500 mg via INTRAVENOUS
  Filled 2022-04-01: qty 30

## 2022-04-01 MED ORDER — SODIUM CHLORIDE 0.9 % IV SOLN
25.0000 mg/m2 | Freq: Once | INTRAVENOUS | Status: AC
  Administered 2022-04-01: 48 mg via INTRAVENOUS
  Filled 2022-04-01: qty 48

## 2022-04-01 MED ORDER — SODIUM CHLORIDE 0.9 % IV SOLN
400.0000 mg/m2 | Freq: Once | INTRAVENOUS | Status: AC
  Administered 2022-04-01: 760 mg via INTRAVENOUS
  Filled 2022-04-01: qty 19.99

## 2022-04-01 MED ORDER — MAGNESIUM SULFATE 2 GM/50ML IV SOLN
2.0000 g | Freq: Once | INTRAVENOUS | Status: AC
  Administered 2022-04-01: 2 g via INTRAVENOUS
  Filled 2022-04-01: qty 50

## 2022-04-01 NOTE — Progress Notes (Signed)
Patient presents today for Imfinzi/Gemzar/Cisplatin infusions per providers order.  Vital sign within parameters for treatment.  Labs pending.  Patient has no new complaints at this time.  Message received from Dr. Delton Coombes, okay to start pre-cisplatin fluids without CMP results.  Labs reviewed and total bilirubin 2.5, MD notified.  Message received from Anastasio Champion RN/Dr. Delton Coombes okay to proceed with treatment if patient is feeling okay.  Patient states he feels fine, proceeding with treatment per MD instruction.  Treatment given today per MD orders.  Stable during infusion without adverse affects.  Vital signs stable.  No complaints at this time.  Discharge from clinic via wheelchair in stable condition.  Alert and oriented X 3.  Follow up with [facility] as scheduled.

## 2022-04-01 NOTE — Patient Instructions (Signed)
[facility]  Discharge Instructions: Thank you for choosing [facility] to provide your oncology and hematology care.  If you have a lab appointment with the [facility], please come in thru the Main Entrance and check in at the main information desk.  Wear comfortable clothing and clothing appropriate for easy access to any Portacath or PICC line.   We strive to give you quality time with your provider. You may need to reschedule your appointment if you arrive late (15 or more minutes).  Arriving late affects you and other patients whose appointments are after yours.  Also, if you miss three or more appointments without notifying the office, you may be dismissed from the clinic at the provider's discretion.      For prescription refill requests, have your pharmacy contact our office and allow 72 hours for refills to be completed.    Today you received the following chemotherapy and/or immunotherapy agents cisplatin. Gemzar,imfinzi      To help prevent nausea and vomiting after your treatment, we encourage you to take your nausea medication as directed.  BELOW ARE SYMPTOMS THAT SHOULD BE REPORTED IMMEDIATELY: *FEVER GREATER THAN 100.4 F (38 C) OR HIGHER *CHILLS OR SWEATING *NAUSEA AND VOMITING THAT IS NOT CONTROLLED WITH YOUR NAUSEA MEDICATION *UNUSUAL SHORTNESS OF BREATH *UNUSUAL BRUISING OR BLEEDING *URINARY PROBLEMS (pain or burning when urinating, or frequent urination) *BOWEL PROBLEMS (unusual diarrhea, constipation, pain near the anus) TENDERNESS IN MOUTH AND THROAT WITH OR WITHOUT PRESENCE OF ULCERS (sore throat, sores in mouth, or a toothache) UNUSUAL RASH, SWELLING OR PAIN  UNUSUAL VAGINAL DISCHARGE OR ITCHING   Items with * indicate a potential emergency and should be followed up as soon as possible or go to the Emergency Department if any problems should occur.  Please show the CHEMOTHERAPY ALERT CARD or IMMUNOTHERAPY ALERT CARD at  check-in to the Emergency Department and triage nurse.  Should you have questions after your visit or need to cancel or reschedule your appointment, please contact [facility] [phone]  and follow the prompts.  Office hours are 8:00 a.m. to 4:30 p.m. Monday - Friday. Please note that voicemails left after 4:00 p.m. may not be returned until the following business day.  We are closed weekends and major holidays. You have access to a nurse at all times for urgent questions. Please call the main number to the clinic [phone] and follow the prompts.  For any non-urgent questions, you may also contact your provider using MyChart. We now offer e-Visits for anyone 5 and older to request care online for non-urgent symptoms. For details visit [email or web address].   Also download the MyChart app! Go to the app store, search "MyChart", open the app, select [facility], and log in with your MyChart username and password.  Masks are optional in the cancer centers. If you would like for your care team to wear a mask while they are taking care of you, please let them know. You may have one support person who is at least 66 years old accompany you for your appointments.

## 2022-04-02 LAB — CANCER ANTIGEN 19-9: CA 19-9: 427 U/mL — ABNORMAL HIGH (ref 0–35)

## 2022-04-07 ENCOUNTER — Other Ambulatory Visit: Payer: Self-pay | Admitting: Hematology

## 2022-04-09 ENCOUNTER — Other Ambulatory Visit: Payer: Self-pay

## 2022-04-09 ENCOUNTER — Telehealth: Payer: Self-pay | Admitting: Cardiovascular Disease

## 2022-04-09 ENCOUNTER — Emergency Department (HOSPITAL_COMMUNITY)
Admission: EM | Admit: 2022-04-09 | Discharge: 2022-04-09 | Disposition: A | Discharge status: Home / Self Care | Payer: Medicare Other | Attending: Emergency Medicine | Admitting: Emergency Medicine

## 2022-04-09 ENCOUNTER — Emergency Department (HOSPITAL_COMMUNITY): Payer: Medicare Other

## 2022-04-09 ENCOUNTER — Encounter (HOSPITAL_COMMUNITY): Payer: Self-pay

## 2022-04-09 DIAGNOSIS — R509 Fever, unspecified: Secondary | ICD-10-CM | POA: Diagnosis present

## 2022-04-09 DIAGNOSIS — Z7901 Long term (current) use of anticoagulants: Secondary | ICD-10-CM | POA: Diagnosis not present

## 2022-04-09 DIAGNOSIS — U071 COVID-19: Secondary | ICD-10-CM | POA: Diagnosis not present

## 2022-04-09 LAB — CBC WITH DIFFERENTIAL/PLATELET
Abs Immature Granulocytes: 0.12 10*3/uL — ABNORMAL HIGH (ref 0.00–0.07)
Basophils Absolute: 0 10*3/uL (ref 0.0–0.1)
Basophils Relative: 1 %
Eosinophils Absolute: 0 10*3/uL (ref 0.0–0.5)
Eosinophils Relative: 1 %
HCT: 27.8 % — ABNORMAL LOW (ref 39.0–52.0)
Hemoglobin: 9.5 g/dL — ABNORMAL LOW (ref 13.0–17.0)
Immature Granulocytes: 3 %
Lymphocytes Relative: 16 %
Lymphs Abs: 0.7 10*3/uL (ref 0.7–4.0)
MCH: 34.8 pg — ABNORMAL HIGH (ref 26.0–34.0)
MCHC: 34.2 g/dL (ref 30.0–36.0)
MCV: 101.8 fL — ABNORMAL HIGH (ref 80.0–100.0)
Monocytes Absolute: 1.3 10*3/uL — ABNORMAL HIGH (ref 0.1–1.0)
Monocytes Relative: 31 %
Neutro Abs: 2 10*3/uL (ref 1.7–7.7)
Neutrophils Relative %: 48 %
Platelets: 41 10*3/uL — ABNORMAL LOW (ref 150–400)
RBC: 2.73 MIL/uL — ABNORMAL LOW (ref 4.22–5.81)
RDW: 15.9 % — ABNORMAL HIGH (ref 11.5–15.5)
WBC: 4.1 10*3/uL (ref 4.0–10.5)
nRBC: 1.2 % — ABNORMAL HIGH (ref 0.0–0.2)

## 2022-04-09 LAB — COMPREHENSIVE METABOLIC PANEL
ALT: 127 U/L — ABNORMAL HIGH (ref 0–44)
AST: 192 U/L — ABNORMAL HIGH (ref 15–41)
Albumin: 2.8 g/dL — ABNORMAL LOW (ref 3.5–5.0)
Alkaline Phosphatase: 303 U/L — ABNORMAL HIGH (ref 38–126)
Anion gap: 7 (ref 5–15)
BUN: 17 mg/dL (ref 8–23)
CO2: 22 mmol/L (ref 22–32)
Calcium: 8.4 mg/dL — ABNORMAL LOW (ref 8.9–10.3)
Chloride: 99 mmol/L (ref 98–111)
Creatinine, Ser: 1.43 mg/dL — ABNORMAL HIGH (ref 0.61–1.24)
GFR, Estimated: 54 mL/min — ABNORMAL LOW (ref >60)
Glucose, Bld: 87 mg/dL (ref 70–99)
Potassium: 4 mmol/L (ref 3.5–5.1)
Sodium: 128 mmol/L — ABNORMAL LOW (ref 135–145)
Total Bilirubin: 2.4 mg/dL — ABNORMAL HIGH (ref 0.3–1.2)
Total Protein: 6.5 g/dL (ref 6.5–8.1)

## 2022-04-09 LAB — PROTIME-INR
INR: 1.1 (ref 0.8–1.2)
Prothrombin Time: 14 seconds (ref 11.4–15.2)

## 2022-04-09 LAB — SARS CORONAVIRUS 2 BY RT PCR: SARS Coronavirus 2 by RT PCR: POSITIVE — AB

## 2022-04-09 MED ORDER — SODIUM CHLORIDE 0.9 % IV BOLUS
500.0000 mL | Freq: Once | INTRAVENOUS | Status: AC
  Administered 2022-04-09: 500 mL via INTRAVENOUS

## 2022-04-09 MED ORDER — MOLNUPIRAVIR EUA 200MG CAPSULE: 4.0000 | ORAL_CAPSULE | Freq: Two times a day (BID) | ORAL | 0 refills | Status: DC

## 2022-04-09 MED ORDER — ACETAMINOPHEN 500 MG PO TABS
500.0000 mg | ORAL_TABLET | Freq: Once | ORAL | Status: AC
  Administered 2022-04-09: 500 mg via ORAL
  Filled 2022-04-09: qty 1

## 2022-04-09 NOTE — ED Notes (Signed)
Patient verbalizes understanding of discharge instructions. Opportunity for questioning and answers were provided. Armband removed by staff, pt discharged from ED. Wheeled out to lobby with wife

## 2022-04-09 NOTE — Telephone Encounter (Signed)
Wife called stating their power is off and requested she be called on her mobile - [phone].

## 2022-04-09 NOTE — Discharge Instructions (Signed)
If you develop high fever, severe cough or cough with blood, trouble breathing, severe headache, neck pain/stiffness, vomiting, or any other new/concerning symptoms then return to the ER for evaluation  

## 2022-04-09 NOTE — Telephone Encounter (Signed)
Due to patient's liver function, do not recommend giving Tylenol.  Recommend rest, fluids, and supportive measures.

## 2022-04-09 NOTE — Telephone Encounter (Signed)
Wife called stating patient has tested positive for COVID and she has been giving him Tylenol.  Wife stated she wants to know if she can continue giving him Tylenol for high temperature every 4 hours until his temperature breaks.

## 2022-04-09 NOTE — ED Triage Notes (Signed)
Pt  w/ fever of 103.1 at home, wife states pts PCP did not want him to take tylenol because his liver. C/o weakness, body aches, cough and headache. Pt took at home Covid test and states it was positive.   Pt is currently getting Chemo for bile duct cancer

## 2022-04-09 NOTE — Telephone Encounter (Signed)
Called patient wife- advised of message below.   Patient was made aware from PCP office if he continue to have any other issues he should be seen in ED. I did advise patient if fever continued to elevate (she states it was 101 this morning) but if it continued to elevate he should go to ED to be evaluated due to liver function.  Thanks!

## 2022-04-09 NOTE — ED Provider Notes (Signed)
[facility] EMERGENCY DEPARTMENT Provider Note   CSN: [id number] Arrival date & time: 04/09/22  1912     History  Chief Complaint  Patient presents with   Fever    Jonathan Keith is a 66 y.o. male.  HPI 66 year old male with a history of cancer currently on treatment presents with fever.  History is primarily from wife.  He has been having a little bit of a headache since last night and woke up and had a fever.  COVID test was positive at home.  Fever has worsened and gotten to a maximum 103.  Some cough.  Some aching and generalized weakness.  Normally does not walk with a walker but today felt like he had to.  Has not been given any meds as she is worried about given Tylenol with his chronic liver injury.  He is also taking Plavix so she was worried about ibuprofen.  Home Medications Prior to Admission medications   Medication Sig Start Date End Date Taking? Authorizing Provider  molnupiravir EUA (LAGEVRIO) 200 mg CAPS capsule Take 4 capsules (800 mg total) by mouth 2 (two) times daily for 5 days. 04/09/22 04/14/22 Yes Sherwood Gambler, MD  acetaminophen (TYLENOL) 500 MG tablet Take 500 mg by mouth every 6 (six) hours as needed for moderate pain or headache.    [provider]  albuterol (VENTOLIN HFA) 108 (90 Base) MCG/ACT inhaler Inhale 2 puffs into the lungs every 6 (six) hours as needed for wheezing or shortness of breath. 11/29/21   Tat, Shanon Brow, MD  atorvastatin (LIPITOR) 40 MG tablet Take 1 tablet (40 mg total) by mouth daily at 6 PM. 12/05/21   O'Neal, Cassie Freer, MD  CISPLATIN IV Inject into the vein once a week. Days 1, 8 every 21 days 07/22/21   [provider]  Durvalumab (IMFINZI IV) Inject into the vein every 21 ( twenty-one) days. 07/22/21   [provider]  famotidine (PEPCID) 20 MG tablet Take 20 mg by mouth daily.    [provider]  ferrous sulfate (FERROUSUL) 325 (65 FE) MG tablet Take 1 tablet (325 mg total) by mouth daily with  breakfast. 03/18/21   Rehman, Mechele Dawley, MD  furosemide (LASIX) 20 MG tablet Take 20 mg daily as needed for swelling 11/05/21   Freada Bergeron, MD  Gemcitabine HCl (GEMZAR IV) Inject into the vein once a week. Days 1, 8 every 21 days 07/22/21   [provider]  Ginseng 100 MG CAPS Take 1 capsule by mouth daily.    [provider]  lidocaine-prilocaine (EMLA) cream Apply a small amount to port a cath site and cover with plastic wrap 1 hour prior to chemotherapy appointments 07/21/21   Derek Jack, MD  loratadine (CLARITIN) 10 MG tablet Take 10 mg by mouth daily. Takes for a few days after shot to boost WBC. About every 2nd treatment    [provider]  metFORMIN (GLUCOPHAGE) 500 MG tablet Take 1 tablet (500 mg total) by mouth 2 (two) times daily with a meal. 05/29/21 01/13/22  Dessa Phi, DO  midodrine (PROAMATINE) 5 MG tablet Take 1 tablet (5 mg total) by mouth 3 (three) times daily with meals. 12/15/21   O'Neal, Cassie Freer, MD  nitroGLYCERIN (NITROSTAT) 0.4 MG SL tablet Place 1 tablet (0.4 mg total) under the tongue every 5 (five) minutes as needed for chest pain. 09/12/21 09/12/22  Ledora Bottcher, PA  pantoprazole (PROTONIX) 40 MG tablet Take 1 tablet (40 mg total) by  mouth daily. 12/05/21   O'Neal, Cassie Freer, MD  prochlorperazine (COMPAZINE) 10 MG tablet TAKE (1) TABLET BY MOUTH EVERY (6) HOURS AS NEEDED. 02/11/22   Derek Jack, MD  tamsulosin (FLOMAX) 0.4 MG CAPS capsule TAKE (2) CAPSULES BY MOUTH AT BEDTIME. 04/07/22   Derek Jack, MD      Allergies    Patient has no known allergies.    Review of Systems   Review of Systems  Constitutional:  Positive for fever.  Respiratory:  Positive for cough. Negative for shortness of breath.   Gastrointestinal:  Negative for abdominal pain.  Neurological:  Positive for headaches.    Physical Exam Updated Vital Signs BP 105/60   Pulse (!) 107   Temp (!) 102.1 F (38.9 C) (Oral)   Resp  (!) 21   Ht '5\' 11"'$  (1.803 m)   Wt 68.5 kg   SpO2 93%   BMI 21.06 kg/m  Physical Exam Vitals and nursing note reviewed.  Constitutional:      General: He is not in acute distress.    Appearance: He is well-developed. He is not ill-appearing or diaphoretic.  HENT:     Head: Normocephalic and atraumatic.  Cardiovascular:     Rate and Rhythm: Regular rhythm. Tachycardia present.     Heart sounds: Normal heart sounds.     Comments: Mild tachycardia Pulmonary:     Effort: Pulmonary effort is normal.     Breath sounds: Normal breath sounds. No wheezing or rales.  Abdominal:     General: There is no distension.     Palpations: Abdomen is soft.     Tenderness: There is no abdominal tenderness.  Skin:    General: Skin is warm and dry.  Neurological:     Mental Status: He is alert.     Comments: 5/5 strength in all 4 extremties     ED Results / Procedures / Treatments   Labs (all labs ordered are listed, but only abnormal results are displayed) Labs Reviewed  SARS CORONAVIRUS 2 BY RT PCR - Abnormal; Notable for the following components:      Result Value   SARS Coronavirus 2 by RT PCR POSITIVE (*)    All other components within normal limits  COMPREHENSIVE METABOLIC PANEL - Abnormal; Notable for the following components:   Sodium 128 (*)    Creatinine, Ser 1.43 (*)    Calcium 8.4 (*)    Albumin 2.8 (*)    AST 192 (*)    ALT 127 (*)    Alkaline Phosphatase 303 (*)    Total Bilirubin 2.4 (*)    GFR, Estimated 54 (*)    All other components within normal limits  CBC WITH DIFFERENTIAL/PLATELET - Abnormal; Notable for the following components:   RBC 2.73 (*)    Hemoglobin 9.5 (*)    HCT 27.8 (*)    MCV 101.8 (*)    MCH 34.8 (*)    RDW 15.9 (*)    Platelets 41 (*)    nRBC 1.2 (*)    Monocytes Absolute 1.3 (*)    Abs Immature Granulocytes 0.12 (*)    All other components within normal limits  PROTIME-INR    EKG None  Radiology DG Chest Portable 1 View  Result  Date: 04/09/2022 CLINICAL DATA:  Cough, COVID. EXAM: PORTABLE CHEST 1 VIEW COMPARISON:  Chest x-ray 11/25/2021 FINDINGS: Right chest port catheter tip projects over the SVC. Surgical coils overlie the right upper abdomen. There is no focal lung infiltrate, pleural  effusion or pneumothorax identified. The cardiomediastinal silhouette is within normal limits. There is some chronic appearing interstitial changes in both lower lungs. IMPRESSION: No acute cardiopulmonary process. Electronically Signed   By: Ronney Asters M.D.   On: 04/09/2022 21:35    Procedures Procedures    Medications Ordered in ED Medications  acetaminophen (TYLENOL) tablet 500 mg (500 mg Oral Given 04/09/22 2142)  sodium chloride 0.9 % bolus 500 mL (0 mLs Intravenous Stopped 04/09/22 2231)    ED Course/ Medical Decision Making/ A&P                           Medical Decision Making Amount and/or Complexity of Data Reviewed Labs: ordered. Radiology: ordered.  Risk OTC drugs.   Chest x-ray shows no pneumonia, I personally viewed/interpret these images.  Labs show normal WBC.  Chronic anemia.  Mildly worsened ALT/AST compared to baseline and mildly worse creatinine consistent with some mild dehydration.  He was given a bolus of fluids and feels a lot better.  He feels like he is stable for discharge home.  Tough as far as how to control his fever given he is got the liver dysfunction and a little bit of kidney dysfunction along with being on antiplatelets.  Discussed with him and wife he should use low doses of either ibuprofen or Tylenol.  Otherwise, I do not think CTs are needed.  Seems like his symptoms are all coming from [place].  We will treat with antivirals given he is high risk patient.  Offered further treatment in the ED versus admission versus discharge and he wants to go home.  I do not think this is unreasonable though did give return precautions.        Final Clinical Impression(s) / ED Diagnoses Final  diagnoses:  COVID-19    Rx / DC Orders ED Discharge Orders          Ordered    molnupiravir EUA (LAGEVRIO) 200 mg CAPS capsule  2 times daily        04/09/22 2238              Sherwood Gambler, MD 04/09/22 2328

## 2022-04-10 ENCOUNTER — Telehealth (HOSPITAL_COMMUNITY): Payer: Self-pay | Admitting: Emergency Medicine

## 2022-04-10 ENCOUNTER — Other Ambulatory Visit: Payer: Self-pay

## 2022-04-10 MED ORDER — MOLNUPIRAVIR 200 MG PO CAPS: 4.0000 | ORAL_CAPSULE | Freq: Two times a day (BID) | ORAL | 0 refills | Status: AC

## 2022-04-10 NOTE — Telephone Encounter (Signed)
Prescribed medication not available at initially preferred pharmacy.  Patient requested medication be sent to [facility] in [place].

## 2022-04-13 ENCOUNTER — Other Ambulatory Visit: Payer: Self-pay | Admitting: Hematology

## 2022-04-13 DIAGNOSIS — Z95828 Presence of other vascular implants and grafts: Secondary | ICD-10-CM

## 2022-04-13 DIAGNOSIS — C221 Intrahepatic bile duct carcinoma: Secondary | ICD-10-CM

## 2022-04-14 ENCOUNTER — Telehealth: Payer: Self-pay | Admitting: *Deleted

## 2022-04-14 NOTE — Telephone Encounter (Signed)
Wife called to advise that he is on day 6 of COVID.  Will reschedule treatment until the week of the 11 th.  And made them aware that they will need to wear a mask upon returning for treatment.

## 2022-04-15 ENCOUNTER — Other Ambulatory Visit: Payer: Self-pay

## 2022-04-15 ENCOUNTER — Inpatient Hospital Stay: Payer: Medicare Other

## 2022-04-15 ENCOUNTER — Inpatient Hospital Stay: Payer: Medicare Other | Admitting: Hematology

## 2022-04-17 ENCOUNTER — Other Ambulatory Visit: Payer: Self-pay

## 2022-04-19 ENCOUNTER — Other Ambulatory Visit: Payer: Self-pay

## 2022-04-21 NOTE — Progress Notes (Signed)
Gemcitabine dose confirmed at 400 mg/m2 per MD.  Entered as 1000 mg/m2 with integrated scheduling.   Modify dose to 400 mg/m2.    MD Note:  "We will decrease the gemcitabine to 400 mg/m2 as he is having some fatigue after each treatment.  If the fatigue does not improve, will consider decreasing cisplatin dose at next visit." 03/17/22  V.O. Dr Rhys Martini, PharmD

## 2022-04-23 ENCOUNTER — Encounter (HOSPITAL_COMMUNITY): Payer: Self-pay | Admitting: Hematology

## 2022-04-23 ENCOUNTER — Encounter: Payer: Self-pay | Admitting: Hematology

## 2022-04-26 NOTE — Progress Notes (Unsigned)
Jonathan Keith, [place] [address]   CLINIC:  Medical Oncology/Hematology  PCP:  [facility], [place] / [place] [address] [phone]   REASON FOR VISIT:  Follow-up for intrahepatic cholangiocarcinoma  PRIOR THERAPY: none  NGS Results: not done  CURRENT THERAPY: Cisplatin + Gemcitabine D1, D15 q 28d  BRIEF ONCOLOGIC HISTORY:  Oncology History  Cholangiocarcinoma ([place])  02/28/2021 Initial Diagnosis   Cholangiocarcinoma ([place])   07/17/2021 Cancer Staging   Staging form: Perihilar Bile Ducts, AJCC 8th Edition - Clinical stage from 07/17/2021: Stage IVB (cTX, cNX, pM1) - Signed by Derek Jack, MD on 07/17/2021 Stage prefix: Initial diagnosis   07/22/2021 - 04/01/2022 Chemotherapy   Patient is on Treatment Plan : BILIARY TRACT Cisplatin + Gemcitabine D1,15 + Imfinzi day 1 q28d     07/22/2021 -  Chemotherapy   Patient is on Treatment Plan : BILIARY TRACT Cisplatin + Gemcitabine D1,8 q21d       CANCER STAGING:  Cancer Staging  Cholangiocarcinoma ([place]) Staging form: Perihilar Bile Ducts, AJCC 8th Edition - Clinical stage from 07/17/2021: Stage IVB (cTX, cNX, pM1) - Signed by Derek Jack, MD on 07/17/2021   INTERVAL HISTORY:  Jonathan Keith, a 66 y.o. male presents for a toxicity check before Cycle 11, Day 15 of gemcitabine and cisplatin. He was last seen by Dr. Delton Coombes on 03/17/2022. In the interim, his chemotherapy was delayed due to testing positive for COVID. Mr. Schutter is accompanied by his wife for this visit.   Mr. Pisarski reports that after his last treatment, his fatigue was not as pronounced and he was able to do more activities.  However his energy levels diminished once contracted COVID.  He is slowly recovering and his energy levels are not back to his baseline.  He is struggling with his appetite due to taste changes.  He is unable to consistently eat as his food preferences change  day-to-day.  He does not enjoy drinking protein shakes but can tolerate Carnation drinks.  He reports occasional episodes of postprandial vomiting.  He takes Compazine with each meal with some improvement.  Additionally, his wife adds that after taking Pepcid his symptoms did improve.  He denies any abdominal pain and his bowel habits are regular.  He denies easy bruising or signs of active bleeding.  He has a mild residual cough from his COVID infection but denies any fevers, chills, sweats, shortness of breath or chest pain.  He has no other complaints.  REVIEW OF SYSTEMS:  Review of Systems  Constitutional:  Positive for appetite change (improved), fatigue (improved) and unexpected weight change (+2 lbs).  HENT:   Negative for tinnitus.   Respiratory:  Positive for cough. Negative for shortness of breath.   Cardiovascular:  Negative for leg swelling.  Gastrointestinal:  Positive for nausea and vomiting. Negative for constipation and diarrhea.  Skin:  Negative for rash.  Neurological:  Negative for numbness.  All other systems reviewed and are negative.   PAST MEDICAL/SURGICAL HISTORY:  Past Medical History:  Diagnosis Date   Cancer ([place])    CHF (congestive heart failure) (HCC)    Chronic pain    neck, back, knees   Class 1 obesity due to excess calories with body mass index (BMI) of 31.0 to 31.9 in adult    Claudication [facility])    Coronary artery disease    DDD (degenerative disc disease), cervical    Diabetes mellitus ([place])    Dyslipidemia  Hypertension    Port-A-Cath in place 07/20/2021   Tobacco dependence    Past Surgical History:  Procedure Laterality Date   BILIARY BRUSHING N/A 02/25/2021   Procedure: BILIARY BRUSHING;  Surgeon: Rogene Houston, MD;  Location: [facility];  Service: Gastroenterology;  Laterality: N/A;   BILIARY STENT PLACEMENT N/A 02/25/2021   Procedure: BILIARY STENT PLACEMENT;  Surgeon: Rogene Houston, MD;  Location: [facility];  Service: Gastroenterology;   Laterality: N/A;   BILIARY STENT PLACEMENT  02/27/2021   Procedure: BILIARY STENT PLACEMENT (10FR x 9cm) IN THE RIGHT SYSTEM;  Surgeon: Rogene Houston, MD;  Location: [facility];  Service: Gastroenterology;;   BREAST SURGERY Left    benign lump- in his 57s.   CARDIAC CATHETERIZATION     ERCP N/A 02/25/2021   Procedure: ENDOSCOPIC RETROGRADE CHOLANGIOPANCREATOGRAPHY (ERCP);  Surgeon: Rogene Houston, MD;  Location: [facility];  Service: Gastroenterology;  Laterality: N/A;   ERCP N/A 02/27/2021   Procedure: ENDOSCOPIC RETROGRADE CHOLANGIOPANCREATOGRAPHY (ERCP);  Surgeon: Rogene Houston, MD;  Location: [facility];  Service: Gastroenterology;  Laterality: N/A;   IR IMAGING GUIDED PORT INSERTION  07/21/2021   KNEE SURGERY Left    x2   LEFT HEART CATH AND CORONARY ANGIOGRAPHY N/A 09/08/2021   Procedure: LEFT HEART CATH AND CORONARY ANGIOGRAPHY;  Surgeon: Early Osmond, MD;  Location: [facility] CV LAB;  Service: Cardiovascular;  Laterality: N/A;   SPHINCTEROTOMY N/A 02/25/2021   Procedure: SPHINCTEROTOMY;  Surgeon: Rogene Houston, MD;  Location: [facility];  Service: Gastroenterology;  Laterality: N/A;   STENT REMOVAL  02/27/2021   Procedure: STENT REMOVAL (8.5Fr x 9cm);  Surgeon: Rogene Houston, MD;  Location: [facility];  Service: Gastroenterology;;    SOCIAL HISTORY:  Social History   Socioeconomic History   Marital status: Married    Spouse name: Not on file   Number of children: Not on file   Years of education: Not on file   Highest education level: Not on file  Occupational History   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  Tobacco Use   Smoking status: Every Day    Packs/day: 0.50    Years: 56.00    Total pack years: 28.00    Types: Cigarettes   Smokeless tobacco: Never  Vaping Use   Vaping Use: Never used  Substance and Sexual Activity   Alcohol use: Not Currently    Comment: stopped 2.5 years ago 03/11/21   Drug use: Never   Sexual activity: Not on file  Other Topics Concern    Not on file  Social History Narrative   Not on file   Social Determinants of Health   Financial Resource Strain: Medium Risk (03/07/2021)   Overall Financial Resource Strain (CARDIA)    Difficulty of Paying Living Expenses: Somewhat hard  Food Insecurity: No Food Insecurity (03/07/2021)   Hunger Vital Sign    Worried About Running Out of Food in the Last Year: Never true    [place] in the Last Year: Never true  Transportation Needs: No Transportation Needs (03/07/2021)   PRAPARE - Hydrologist (Medical): No    Lack of Transportation (Non-Medical): No  Physical Activity: Insufficiently Active (03/07/2021)   Exercise Vital Sign    Days of Exercise per Week: 2 days    Minutes of Exercise per Session: 20 min  Stress: No Stress Concern Present (03/07/2021)   [employer] [place]  of Stress : Only a little  Social Connections: Moderately Integrated (03/07/2021)   Social Connection and Isolation Panel [NHANES]    Frequency of Communication with Friends and Family: More than three times a week    Frequency of Social Gatherings with Friends and Family: More than three times a week    Attends Religious Services: More than 4 times per year    Active Member of [employer] or Organizations: No    Attends Archivist Meetings: Never    Marital Status: Married  Human resources officer Violence: Not At Risk (03/07/2021)   Humiliation, Afraid, Rape, and Kick questionnaire    Fear of Current or Ex-Partner: No    Emotionally Abused: No    Physically Abused: No    Sexually Abused: No    FAMILY HISTORY:  Family History  Problem Relation Age of Onset   CAD Mother    Cancer Neg Hx     CURRENT MEDICATIONS:  Current Outpatient Medications  Medication Sig Dispense Refill   acetaminophen (TYLENOL) 500 MG tablet Take 500 mg by mouth every 6 (six) hours as needed for moderate pain or headache.      albuterol (VENTOLIN HFA) 108 (90 Base) MCG/ACT inhaler Inhale 2 puffs into the lungs every 6 (six) hours as needed for wheezing or shortness of breath. 6.7 g 1   atorvastatin (LIPITOR) 40 MG tablet Take 1 tablet (40 mg total) by mouth daily at 6 PM. 90 tablet 3   CISPLATIN IV Inject into the vein once a week. Days 1, 8 every 21 days     Durvalumab (IMFINZI IV) Inject into the vein every 21 ( twenty-one) days.     famotidine (PEPCID) 20 MG tablet Take 20 mg by mouth daily.     ferrous sulfate (FERROUSUL) 325 (65 FE) MG tablet Take 1 tablet (325 mg total) by mouth daily with breakfast.     furosemide (LASIX) 20 MG tablet Take 20 mg daily as needed for swelling 90 tablet 1   Gemcitabine HCl (GEMZAR IV) Inject into the vein once a week. Days 1, 8 every 21 days     Ginseng 100 MG CAPS Take 1 capsule by mouth daily.     loratadine (CLARITIN) 10 MG tablet Take 10 mg by mouth daily. Takes for a few days after shot to boost WBC. About every 2nd treatment     midodrine (PROAMATINE) 5 MG tablet Take 1 tablet (5 mg total) by mouth 3 (three) times daily with meals. 270 tablet 3   nitroGLYCERIN (NITROSTAT) 0.4 MG SL tablet Place 1 tablet (0.4 mg total) under the tongue every 5 (five) minutes as needed for chest pain. 25 tablet 3   pantoprazole (PROTONIX) 40 MG tablet Take 1 tablet (40 mg total) by mouth daily. 90 tablet 3   tamsulosin (FLOMAX) 0.4 MG CAPS capsule TAKE (2) CAPSULES BY MOUTH AT BEDTIME. 60 capsule 0   metFORMIN (GLUCOPHAGE) 500 MG tablet Take 1 tablet (500 mg total) by mouth 2 (two) times daily with a meal. 60 tablet 0   prochlorperazine (COMPAZINE) 10 MG tablet TAKE (1) TABLET BY MOUTH EVERY (6) HOURS AS NEEDED. 90 tablet 3   No current facility-administered medications for this visit.   Facility-Administered Medications Ordered in Other Visits  Medication Dose Route Frequency Provider Last Rate Last Admin   0.9 %  sodium chloride infusion   Intravenous Once Derek Jack, MD       0.9  %  sodium chloride infusion   Intravenous Once  Derek Jack, MD       0.9 %  sodium chloride infusion   Intravenous Continuous Lincoln Brigham, PA-C 10 mL/hr at 04/27/22 0909 New Bag at 04/27/22 0909   CISplatin (PLATINOL) 46 mg in sodium chloride 0.9 % 250 mL chemo infusion  25 mg/m2 (Treatment Plan Recorded) Intravenous Once Derek Jack, MD       dexamethasone (DECADRON) 10 mg in sodium chloride 0.9 % 50 mL IVPB  10 mg Intravenous Once Derek Jack, MD       fosaprepitant (EMEND) 150 mg in sodium chloride 0.9 % 145 mL IVPB  150 mg Intravenous Once Derek Jack, MD       gemcitabine (GEMZAR) 760 mg in sodium chloride 0.9 % 250 mL chemo infusion  400 mg/m2 (Order-Specific) Intravenous Once Derek Jack, MD       heparin lock flush 100 unit/mL  500 Units Intracatheter Once PRN Derek Jack, MD       magnesium sulfate IVPB 2 g 50 mL  2 g Intravenous Once Derek Jack, MD       palonosetron (ALOXI) injection 0.25 mg  0.25 mg Intravenous Once Derek Jack, MD       sodium chloride flush (NS) 0.9 % injection 10 mL  10 mL Intracatheter PRN Derek Jack, MD        ALLERGIES:  No Known Allergies  PHYSICAL EXAM:  Performance status (ECOG): 0 - Asymptomatic  There were no vitals filed for this visit. Wt Readings from Last 3 Encounters:  04/27/22 150 lb 6.4 oz (68.2 kg)  04/09/22 151 lb (68.5 kg)  04/01/22 155 lb (70.3 kg)   Physical Exam Vitals reviewed.  Constitutional:      Appearance: Normal appearance.  Cardiovascular:     Rate and Rhythm: Normal rate and regular rhythm.     Pulses: Normal pulses.     Heart sounds: Normal heart sounds.  Pulmonary:     Effort: Pulmonary effort is normal.     Breath sounds: Normal breath sounds.  Abdominal:     Palpations: Abdomen is soft. There is no mass.     Tenderness: There is no abdominal tenderness.  Musculoskeletal:     Right lower leg: No edema.     Left lower leg: No  edema.  Skin:    General: Skin is warm.     Coloration: Skin is not jaundiced.  Neurological:     General: No focal deficit present.     Mental Status: He is alert and oriented to person, place, and time.  Psychiatric:        Mood and Affect: Mood normal.        Behavior: Behavior normal.     LABORATORY DATA:  I have reviewed the labs as listed.     Latest Ref Rng & Units 04/27/2022    7:54 AM 04/09/2022    9:31 PM 04/01/2022    7:49 AM  CBC  WBC 4.0 - 10.5 K/uL 5.2  4.1  5.3   Hemoglobin 13.0 - 17.0 g/dL 10.2  9.5  10.3   Hematocrit 39.0 - 52.0 % 30.4  27.8  31.1   Platelets 150 - 400 K/uL 106  41  112       Latest Ref Rng & Units 04/27/2022    7:54 AM 04/09/2022    9:31 PM 04/01/2022    7:49 AM  CMP  Glucose 70 - 99 mg/dL 174  87  190   BUN 8 - 23 mg/dL 13  17  14   Creatinine 0.61 - 1.24 mg/dL 1.20  1.43  1.28   Sodium 135 - 145 mmol/L 132  128  131   Potassium 3.5 - 5.1 mmol/L 3.8  4.0  4.2   Chloride 98 - 111 mmol/L 103  99  102   CO2 22 - 32 mmol/L '23  22  23   '$ Calcium 8.9 - 10.3 mg/dL 8.9  8.4  8.7   Total Protein 6.5 - 8.1 g/dL 6.9  6.5  6.9   Total Bilirubin 0.3 - 1.2 mg/dL 1.8  2.4  2.5   Alkaline Phos 38 - 126 U/L 301  303  328   AST 15 - 41 U/L 59  192  77   ALT 0 - 44 U/L 36  127  64     DIAGNOSTIC IMAGING:  I have independently reviewed the scans and discussed with the patient. DG Chest Portable 1 View  Result Date: 04/09/2022 CLINICAL DATA:  Cough, COVID. EXAM: PORTABLE CHEST 1 VIEW COMPARISON:  Chest x-ray 11/25/2021 FINDINGS: Right chest port catheter tip projects over the SVC. Surgical coils overlie the right upper abdomen. There is no focal lung infiltrate, pleural effusion or pneumothorax identified. The cardiomediastinal silhouette is within normal limits. There is some chronic appearing interstitial changes in both lower lungs. IMPRESSION: No acute cardiopulmonary process. Electronically Signed   By: Ronney Asters M.D.   On: 04/09/2022 21:35      ASSESSMENT:  1.Metastatic cholangiocarcinoma: - 02/2021 initial diagnosis presentation with painless jaundice - ERCP with sphincterotomy, subsequent ERCP with plastic biliary stent placement. - MRI-2 cm mass involving the common hepatic duct with upstream biliary dilatation. - 03/2021 consult at [facility] with GI and surgery-confirm diagnosis of perihilar cholangiocarcinoma - 03/21/2021-biopsy/pathology, ERCP-common hepatic duct cytology and brushings performed.  EUS-1 enlarged lymph node versus tumor implant in the gastrohepatic ligament-negative for malignancy.  Single biliary plastic stent removed and replaced with the plastic stent in the right and left ducts. - 04/17/2021-right portal vein embolization to promote left hepatic hypertrophy. - 07/02/2021-diagnostic laparoscopy with multiple areas concerning for metastatic blocks identified in the abdomen and peritoneal surfaces.  6 separate biopsies obtained. - Pathology consistent with metastatic carcinoma. - Cycle 1 of gemcitabine, cisplatin, durvalumab on 07/22/2021. - CT CAP from 03/16/2022: Stable exam with no clear evidence of metastatic disease in the chest, abdomen or pelvis.  Mild to moderate intrahepatic biliary ductal dilatation is unchanged. -- Decreased gemcitabine from 500 mg/m2 to 400 mg/m2 starting Cycle 10, Day 15 due to fatigue.  2.  Social history/family: - Lives at home with his wife.  He worked as a Development worker, community. - Current active smoker, 1 pack/day since age 65. - Maternal grandmother had cancer.  Maternal uncle had brain tumor.   PLAN:  1.Metastatic cholangiocarcinoma: -Patient presents today for Cycle 11, Day 15 of chemotherapy with gemcitabine and cisplatin.  --Labs from today were reviewed and adequate for treatment.  Anemia has improved with a hemoglobin of 10.2, thrombocytopenia has improved with a platelet count of 106K.  ANC is normal at 3.5.  Creatinine is normal at 1.20.  Liver function is improving with  AST 59, ALT 36, Tbili 1.8. --Proceed with treatment today without any dose modifications. - RTC on 10/04/20923 for labs and follow-up before Cycle 12, Day 1  2.  Weight loss: -Patient lost nearly 10 lbs since 03/18/2022. Recent COVID infection contributed to his weight loss.  --Encouraged to eat small, frequent meals and supplement with protein shakes --  Patient is under the care of dietitian who he will see later today.  3.  Urinary problems: - Continue Flomax 0.8 mg daily.   4.  Hypotension: - Continue midodrine 5 mg 3 times daily.  Blood pressure today is 134/81.  5. Post prandial nausea/vomiting: --Continue to take Compazine 10 mg every 6 hours as needed. Sent refill --Found relief with taking Pepcid so recommended to take once daily.    Orders placed this encounter:  No orders of the defined types were placed in this encounter.  I have spent a total of 30 minutes minutes of face-to-face and non-face-to-face time, preparing to see the patient, performing a medically appropriate examination, counseling and educating the patient, ordering medications, documenting clinical information in the electronic health record,  and care coordination.    Dede Query PA-C Dept of Hematology and [place] Phone: [phone]

## 2022-04-27 ENCOUNTER — Inpatient Hospital Stay: Payer: Medicare Other

## 2022-04-27 ENCOUNTER — Inpatient Hospital Stay: Payer: Medicare Other | Attending: Hematology

## 2022-04-27 ENCOUNTER — Inpatient Hospital Stay: Payer: Medicare Other | Admitting: Dietician

## 2022-04-27 ENCOUNTER — Inpatient Hospital Stay (HOSPITAL_BASED_OUTPATIENT_CLINIC_OR_DEPARTMENT_OTHER): Payer: Medicare Other | Admitting: Physician Assistant

## 2022-04-27 VITALS — BP 138/81 | HR 90 | Temp 96.7°F | Resp 18

## 2022-04-27 VITALS — BP 134/81 | HR 114 | Temp 97.4°F | Resp 18 | Ht 70.08 in | Wt 150.4 lb

## 2022-04-27 DIAGNOSIS — Z95828 Presence of other vascular implants and grafts: Secondary | ICD-10-CM | POA: Diagnosis not present

## 2022-04-27 DIAGNOSIS — E860 Dehydration: Secondary | ICD-10-CM

## 2022-04-27 DIAGNOSIS — Z8616 Personal history of COVID-19: Secondary | ICD-10-CM | POA: Insufficient documentation

## 2022-04-27 DIAGNOSIS — Z5111 Encounter for antineoplastic chemotherapy: Secondary | ICD-10-CM | POA: Insufficient documentation

## 2022-04-27 DIAGNOSIS — I110 Hypertensive heart disease with heart failure: Secondary | ICD-10-CM | POA: Diagnosis not present

## 2022-04-27 DIAGNOSIS — E785 Hyperlipidemia, unspecified: Secondary | ICD-10-CM | POA: Insufficient documentation

## 2022-04-27 DIAGNOSIS — R112 Nausea with vomiting, unspecified: Secondary | ICD-10-CM | POA: Insufficient documentation

## 2022-04-27 DIAGNOSIS — Z5986 Financial insecurity: Secondary | ICD-10-CM | POA: Diagnosis not present

## 2022-04-27 DIAGNOSIS — R5383 Other fatigue: Secondary | ICD-10-CM | POA: Diagnosis not present

## 2022-04-27 DIAGNOSIS — R059 Cough, unspecified: Secondary | ICD-10-CM | POA: Insufficient documentation

## 2022-04-27 DIAGNOSIS — I2510 Atherosclerotic heart disease of native coronary artery without angina pectoris: Secondary | ICD-10-CM | POA: Diagnosis not present

## 2022-04-27 DIAGNOSIS — Z79899 Other long term (current) drug therapy: Secondary | ICD-10-CM | POA: Insufficient documentation

## 2022-04-27 DIAGNOSIS — F17210 Nicotine dependence, cigarettes, uncomplicated: Secondary | ICD-10-CM | POA: Diagnosis not present

## 2022-04-27 DIAGNOSIS — Z8249 Family history of ischemic heart disease and other diseases of the circulatory system: Secondary | ICD-10-CM | POA: Diagnosis not present

## 2022-04-27 DIAGNOSIS — C221 Intrahepatic bile duct carcinoma: Secondary | ICD-10-CM | POA: Diagnosis present

## 2022-04-27 DIAGNOSIS — I509 Heart failure, unspecified: Secondary | ICD-10-CM | POA: Insufficient documentation

## 2022-04-27 DIAGNOSIS — C249 Malignant neoplasm of biliary tract, unspecified: Secondary | ICD-10-CM

## 2022-04-27 LAB — CBC WITH DIFFERENTIAL/PLATELET
Abs Immature Granulocytes: 0.01 10*3/uL (ref 0.00–0.07)
Basophils Absolute: 0 10*3/uL (ref 0.0–0.1)
Basophils Relative: 1 %
Eosinophils Absolute: 0 10*3/uL (ref 0.0–0.5)
Eosinophils Relative: 1 %
HCT: 30.4 % — ABNORMAL LOW (ref 39.0–52.0)
Hemoglobin: 10.2 g/dL — ABNORMAL LOW (ref 13.0–17.0)
Immature Granulocytes: 0 %
Lymphocytes Relative: 17 %
Lymphs Abs: 0.9 10*3/uL (ref 0.7–4.0)
MCH: 34.7 pg — ABNORMAL HIGH (ref 26.0–34.0)
MCHC: 33.6 g/dL (ref 30.0–36.0)
MCV: 103.4 fL — ABNORMAL HIGH (ref 80.0–100.0)
Monocytes Absolute: 0.8 10*3/uL (ref 0.1–1.0)
Monocytes Relative: 15 %
Neutro Abs: 3.5 10*3/uL (ref 1.7–7.7)
Neutrophils Relative %: 66 %
Platelets: 106 10*3/uL — ABNORMAL LOW (ref 150–400)
RBC: 2.94 MIL/uL — ABNORMAL LOW (ref 4.22–5.81)
RDW: 15 % (ref 11.5–15.5)
WBC: 5.2 10*3/uL (ref 4.0–10.5)
nRBC: 0 % (ref 0.0–0.2)

## 2022-04-27 LAB — TSH: TSH: 0.865 u[IU]/mL (ref 0.350–4.500)

## 2022-04-27 LAB — COMPREHENSIVE METABOLIC PANEL
ALT: 36 U/L (ref 0–44)
AST: 59 U/L — ABNORMAL HIGH (ref 15–41)
Albumin: 2.8 g/dL — ABNORMAL LOW (ref 3.5–5.0)
Alkaline Phosphatase: 301 U/L — ABNORMAL HIGH (ref 38–126)
Anion gap: 6 (ref 5–15)
BUN: 13 mg/dL (ref 8–23)
CO2: 23 mmol/L (ref 22–32)
Calcium: 8.9 mg/dL (ref 8.9–10.3)
Chloride: 103 mmol/L (ref 98–111)
Creatinine, Ser: 1.2 mg/dL (ref 0.61–1.24)
GFR, Estimated: >60 mL/min (ref >60)
Glucose, Bld: 174 mg/dL — ABNORMAL HIGH (ref 70–99)
Potassium: 3.8 mmol/L (ref 3.5–5.1)
Sodium: 132 mmol/L — ABNORMAL LOW (ref 135–145)
Total Bilirubin: 1.8 mg/dL — ABNORMAL HIGH (ref 0.3–1.2)
Total Protein: 6.9 g/dL (ref 6.5–8.1)

## 2022-04-27 LAB — MAGNESIUM: Magnesium: 1.8 mg/dL (ref 1.7–2.4)

## 2022-04-27 MED ORDER — MAGNESIUM SULFATE 2 GM/50ML IV SOLN
2.0000 g | Freq: Once | INTRAVENOUS | Status: AC
  Administered 2022-04-27: 2 g via INTRAVENOUS
  Filled 2022-04-27: qty 50

## 2022-04-27 MED ORDER — SODIUM CHLORIDE 0.9 % IV SOLN
25.0000 mg/m2 | Freq: Once | INTRAVENOUS | Status: AC
  Administered 2022-04-27: 46 mg via INTRAVENOUS
  Filled 2022-04-27: qty 46

## 2022-04-27 MED ORDER — SODIUM CHLORIDE 0.9 % IV SOLN
400.0000 mg/m2 | Freq: Once | INTRAVENOUS | Status: AC
  Administered 2022-04-27: 760 mg via INTRAVENOUS
  Filled 2022-04-27: qty 19.99

## 2022-04-27 MED ORDER — HEPARIN SOD (PORK) LOCK FLUSH 100 UNIT/ML IV SOLN
500.0000 [IU] | Freq: Once | INTRAVENOUS | Status: AC | PRN
  Administered 2022-04-27: 500 [IU]

## 2022-04-27 MED ORDER — SODIUM CHLORIDE 0.9 % IV SOLN: Freq: Once | INTRAVENOUS | Status: AC

## 2022-04-27 MED ORDER — POTASSIUM CHLORIDE IN NACL 20-0.9 MEQ/L-% IV SOLN
Freq: Once | INTRAVENOUS | Status: AC
  Filled 2022-04-27: qty 1000

## 2022-04-27 MED ORDER — SODIUM CHLORIDE 0.9 % IV SOLN
10.0000 mg | Freq: Once | INTRAVENOUS | Status: AC
  Administered 2022-04-27: 10 mg via INTRAVENOUS
  Filled 2022-04-27: qty 10

## 2022-04-27 MED ORDER — PROCHLORPERAZINE MALEATE 10 MG PO TABS: ORAL_TABLET | ORAL | 3 refills | Status: DC

## 2022-04-27 MED ORDER — SODIUM CHLORIDE 0.9 % IV SOLN
150.0000 mg | Freq: Once | INTRAVENOUS | Status: AC
  Administered 2022-04-27: 150 mg via INTRAVENOUS
  Filled 2022-04-27: qty 150

## 2022-04-27 MED ORDER — SODIUM CHLORIDE 0.9 % IV SOLN: INTRAVENOUS | Status: DC

## 2022-04-27 MED ORDER — SODIUM CHLORIDE 0.9% FLUSH
10.0000 mL | INTRAVENOUS | Status: DC | PRN
  Administered 2022-04-27: 10 mL

## 2022-04-27 MED ORDER — SODIUM CHLORIDE 0.9% FLUSH
10.0000 mL | Freq: Once | INTRAVENOUS | Status: AC | PRN
  Administered 2022-04-27: 10 mL

## 2022-04-27 MED ORDER — PALONOSETRON HCL INJECTION 0.25 MG/5ML
0.2500 mg | Freq: Once | INTRAVENOUS | Status: AC
  Administered 2022-04-27: 0.25 mg via INTRAVENOUS
  Filled 2022-04-27: qty 5

## 2022-04-27 NOTE — Progress Notes (Signed)
Nutrition Follow-up:  Patient with intrahepatic cholangiocarcinoma. He is recurrently receiving cisplatin + gemcitabine q21d. Last infusion received 8/23.  Noted 8/31 - pt seen in ED secondary to COVID +   Met with patient and wife during infusion. Patient reports ongoing fatigue since recovering from Covid. This is improving. Wife reports appetite is poor. He is having altered taste. This varies from day to day. Patient is drinking CIB but not consistently. Wife recalls recently pt likes peaches and frozen chicken pot pie. Patient reports frequent nausea. He is taking compazine three times daily. This works well most days.   Medications: reviewed   Labs: Na 132, glucose 174, albumin 2.8, total bilirubin 1.8  Anthropometrics: Weight 150 lb 6.4 oz today decreased   8/8 - 155 lb 8/23 - 155 lb   NUTRITION DIAGNOSIS: Unintentional weight loss continues    INTERVENTION:  Recommend 2 CIB with whole milk daily Reviewed strategies for nausea - wife has handout Reviewed strategies for altered taste - wife has handout Take nausea medication as prescribed per MD   MONITORING, EVALUATION, GOAL: weight trends, intake    NEXT VISIT: To be scheduled as needed

## 2022-04-27 NOTE — Progress Notes (Signed)
Labs reviewed today at office visit. Ok to proceed as planned per [employer].   Treatment given per orders. Patient tolerated it well without problems. Vitals stable and discharged home from clinic via wheelchair. Follow up as scheduled.

## 2022-04-27 NOTE — Patient Instructions (Signed)
[facility]  Discharge Instructions: Thank you for choosing [facility] to provide your oncology and hematology care.  If you have a lab appointment with the [facility], please come in thru the Main Entrance and check in at the main information desk.  Wear comfortable clothing and clothing appropriate for easy access to any Portacath or PICC line.   We strive to give you quality time with your provider. You may need to reschedule your appointment if you arrive late (15 or more minutes).  Arriving late affects you and other patients whose appointments are after yours.  Also, if you miss three or more appointments without notifying the office, you may be dismissed from the clinic at the provider's discretion.      For prescription refill requests, have your pharmacy contact our office and allow 72 hours for refills to be completed.    Today you received the following chemotherapy and/or immunotherapy agents gemzar, cisplatin      To help prevent nausea and vomiting after your treatment, we encourage you to take your nausea medication as directed.  BELOW ARE SYMPTOMS THAT SHOULD BE REPORTED IMMEDIATELY: *FEVER GREATER THAN 100.4 F (38 C) OR HIGHER *CHILLS OR SWEATING *NAUSEA AND VOMITING THAT IS NOT CONTROLLED WITH YOUR NAUSEA MEDICATION *UNUSUAL SHORTNESS OF BREATH *UNUSUAL BRUISING OR BLEEDING *URINARY PROBLEMS (pain or burning when urinating, or frequent urination) *BOWEL PROBLEMS (unusual diarrhea, constipation, pain near the anus) TENDERNESS IN MOUTH AND THROAT WITH OR WITHOUT PRESENCE OF ULCERS (sore throat, sores in mouth, or a toothache) UNUSUAL RASH, SWELLING OR PAIN  UNUSUAL VAGINAL DISCHARGE OR ITCHING   Items with * indicate a potential emergency and should be followed up as soon as possible or go to the Emergency Department if any problems should occur.  Please show the CHEMOTHERAPY ALERT CARD or IMMUNOTHERAPY ALERT CARD at check-in to the  Emergency Department and triage nurse.  Should you have questions after your visit or need to cancel or reschedule your appointment, please contact [facility] [phone]  and follow the prompts.  Office hours are 8:00 a.m. to 4:30 p.m. Monday - Friday. Please note that voicemails left after 4:00 p.m. may not be returned until the following business day.  We are closed weekends and major holidays. You have access to a nurse at all times for urgent questions. Please call the main number to the clinic [phone] and follow the prompts.  For any non-urgent questions, you may also contact your provider using MyChart. We now offer e-Visits for anyone 85 and older to request care online for non-urgent symptoms. For details visit [email or web address].   Also download the MyChart app! Go to the app store, search "MyChart", open the app, select [facility], and log in with your MyChart username and password.  Masks are optional in the cancer centers. If you would like for your care team to wear a mask while they are taking care of you, please let them know. You may have one support person who is at least 66 years old accompany you for your appointments.

## 2022-04-28 LAB — CANCER ANTIGEN 19-9: CA 19-9: 450 U/mL — ABNORMAL HIGH (ref 0–35)

## 2022-05-08 ENCOUNTER — Other Ambulatory Visit: Payer: Self-pay | Admitting: Hematology

## 2022-05-12 ENCOUNTER — Other Ambulatory Visit: Payer: Self-pay

## 2022-05-13 ENCOUNTER — Inpatient Hospital Stay: Payer: Medicare Other

## 2022-05-13 ENCOUNTER — Other Ambulatory Visit: Payer: Self-pay

## 2022-05-13 ENCOUNTER — Inpatient Hospital Stay (HOSPITAL_BASED_OUTPATIENT_CLINIC_OR_DEPARTMENT_OTHER): Payer: Medicare Other | Admitting: Hematology

## 2022-05-13 ENCOUNTER — Inpatient Hospital Stay: Payer: Medicare Other | Attending: Hematology

## 2022-05-13 VITALS — BP 155/81 | HR 77 | Temp 96.8°F | Resp 18

## 2022-05-13 DIAGNOSIS — Z95828 Presence of other vascular implants and grafts: Secondary | ICD-10-CM

## 2022-05-13 DIAGNOSIS — Z8249 Family history of ischemic heart disease and other diseases of the circulatory system: Secondary | ICD-10-CM | POA: Diagnosis not present

## 2022-05-13 DIAGNOSIS — I110 Hypertensive heart disease with heart failure: Secondary | ICD-10-CM | POA: Insufficient documentation

## 2022-05-13 DIAGNOSIS — R42 Dizziness and giddiness: Secondary | ICD-10-CM | POA: Diagnosis not present

## 2022-05-13 DIAGNOSIS — R350 Frequency of micturition: Secondary | ICD-10-CM | POA: Insufficient documentation

## 2022-05-13 DIAGNOSIS — Z5111 Encounter for antineoplastic chemotherapy: Secondary | ICD-10-CM | POA: Insufficient documentation

## 2022-05-13 DIAGNOSIS — E785 Hyperlipidemia, unspecified: Secondary | ICD-10-CM | POA: Insufficient documentation

## 2022-05-13 DIAGNOSIS — Z5986 Financial insecurity: Secondary | ICD-10-CM | POA: Diagnosis not present

## 2022-05-13 DIAGNOSIS — Z79899 Other long term (current) drug therapy: Secondary | ICD-10-CM | POA: Diagnosis not present

## 2022-05-13 DIAGNOSIS — D696 Thrombocytopenia, unspecified: Secondary | ICD-10-CM | POA: Diagnosis not present

## 2022-05-13 DIAGNOSIS — I2510 Atherosclerotic heart disease of native coronary artery without angina pectoris: Secondary | ICD-10-CM | POA: Insufficient documentation

## 2022-05-13 DIAGNOSIS — I509 Heart failure, unspecified: Secondary | ICD-10-CM | POA: Diagnosis not present

## 2022-05-13 DIAGNOSIS — C221 Intrahepatic bile duct carcinoma: Secondary | ICD-10-CM

## 2022-05-13 DIAGNOSIS — Z5112 Encounter for antineoplastic immunotherapy: Secondary | ICD-10-CM | POA: Insufficient documentation

## 2022-05-13 DIAGNOSIS — R634 Abnormal weight loss: Secondary | ICD-10-CM | POA: Insufficient documentation

## 2022-05-13 DIAGNOSIS — K5900 Constipation, unspecified: Secondary | ICD-10-CM | POA: Insufficient documentation

## 2022-05-13 DIAGNOSIS — R110 Nausea: Secondary | ICD-10-CM | POA: Diagnosis not present

## 2022-05-13 DIAGNOSIS — F17210 Nicotine dependence, cigarettes, uncomplicated: Secondary | ICD-10-CM | POA: Diagnosis not present

## 2022-05-13 DIAGNOSIS — E119 Type 2 diabetes mellitus without complications: Secondary | ICD-10-CM | POA: Insufficient documentation

## 2022-05-13 LAB — CBC WITH DIFFERENTIAL/PLATELET
Abs Immature Granulocytes: 0.02 10*3/uL (ref 0.00–0.07)
Basophils Absolute: 0 10*3/uL (ref 0.0–0.1)
Basophils Relative: 0 %
Eosinophils Absolute: 0.1 10*3/uL (ref 0.0–0.5)
Eosinophils Relative: 2 %
HCT: 29.8 % — ABNORMAL LOW (ref 39.0–52.0)
Hemoglobin: 9.9 g/dL — ABNORMAL LOW (ref 13.0–17.0)
Immature Granulocytes: 0 %
Lymphocytes Relative: 17 %
Lymphs Abs: 0.8 10*3/uL (ref 0.7–4.0)
MCH: 34.7 pg — ABNORMAL HIGH (ref 26.0–34.0)
MCHC: 33.2 g/dL (ref 30.0–36.0)
MCV: 104.6 fL — ABNORMAL HIGH (ref 80.0–100.0)
Monocytes Absolute: 0.7 10*3/uL (ref 0.1–1.0)
Monocytes Relative: 15 %
Neutro Abs: 3.1 10*3/uL (ref 1.7–7.7)
Neutrophils Relative %: 66 %
Platelets: 97 10*3/uL — ABNORMAL LOW (ref 150–400)
RBC: 2.85 MIL/uL — ABNORMAL LOW (ref 4.22–5.81)
RDW: 15.9 % — ABNORMAL HIGH (ref 11.5–15.5)
WBC: 4.7 10*3/uL (ref 4.0–10.5)
nRBC: 0 % (ref 0.0–0.2)

## 2022-05-13 LAB — MAGNESIUM: Magnesium: 2 mg/dL (ref 1.7–2.4)

## 2022-05-13 LAB — COMPREHENSIVE METABOLIC PANEL
ALT: 33 U/L (ref 0–44)
AST: 49 U/L — ABNORMAL HIGH (ref 15–41)
Albumin: 2.8 g/dL — ABNORMAL LOW (ref 3.5–5.0)
Alkaline Phosphatase: 219 U/L — ABNORMAL HIGH (ref 38–126)
Anion gap: 7 (ref 5–15)
BUN: 12 mg/dL (ref 8–23)
CO2: 23 mmol/L (ref 22–32)
Calcium: 8.6 mg/dL — ABNORMAL LOW (ref 8.9–10.3)
Chloride: 102 mmol/L (ref 98–111)
Creatinine, Ser: 1.08 mg/dL (ref 0.61–1.24)
GFR, Estimated: >60 mL/min (ref >60)
Glucose, Bld: 203 mg/dL — ABNORMAL HIGH (ref 70–99)
Potassium: 4.2 mmol/L (ref 3.5–5.1)
Sodium: 132 mmol/L — ABNORMAL LOW (ref 135–145)
Total Bilirubin: 1.4 mg/dL — ABNORMAL HIGH (ref 0.3–1.2)
Total Protein: 6.5 g/dL (ref 6.5–8.1)

## 2022-05-13 MED ORDER — SODIUM CHLORIDE 0.9 % IV SOLN: Freq: Once | INTRAVENOUS | Status: AC

## 2022-05-13 MED ORDER — PALONOSETRON HCL INJECTION 0.25 MG/5ML
0.2500 mg | Freq: Once | INTRAVENOUS | Status: AC
  Administered 2022-05-13: 0.25 mg via INTRAVENOUS
  Filled 2022-05-13: qty 5

## 2022-05-13 MED ORDER — SODIUM CHLORIDE 0.9 % IV SOLN
1500.0000 mg | Freq: Once | INTRAVENOUS | Status: AC
  Administered 2022-05-13: 1500 mg via INTRAVENOUS
  Filled 2022-05-13: qty 30

## 2022-05-13 MED ORDER — HEPARIN SOD (PORK) LOCK FLUSH 100 UNIT/ML IV SOLN
500.0000 [IU] | Freq: Once | INTRAVENOUS | Status: AC | PRN
  Administered 2022-05-13: 500 [IU]

## 2022-05-13 MED ORDER — SODIUM CHLORIDE 0.9 % IV SOLN
400.0000 mg/m2 | Freq: Once | INTRAVENOUS | Status: AC
  Administered 2022-05-13: 760 mg via INTRAVENOUS
  Filled 2022-05-13: qty 19.99

## 2022-05-13 MED ORDER — MAGNESIUM SULFATE 2 GM/50ML IV SOLN
2.0000 g | Freq: Once | INTRAVENOUS | Status: AC
  Administered 2022-05-13: 2 g via INTRAVENOUS
  Filled 2022-05-13: qty 50

## 2022-05-13 MED ORDER — SODIUM CHLORIDE 0.9 % IV SOLN
25.0000 mg/m2 | Freq: Once | INTRAVENOUS | Status: AC
  Administered 2022-05-13: 46 mg via INTRAVENOUS
  Filled 2022-05-13: qty 46

## 2022-05-13 MED ORDER — SODIUM CHLORIDE 0.9 % IV SOLN
10.0000 mg | Freq: Once | INTRAVENOUS | Status: AC
  Administered 2022-05-13: 10 mg via INTRAVENOUS
  Filled 2022-05-13: qty 1

## 2022-05-13 MED ORDER — POTASSIUM CHLORIDE IN NACL 20-0.9 MEQ/L-% IV SOLN
Freq: Once | INTRAVENOUS | Status: AC
  Filled 2022-05-13: qty 1000

## 2022-05-13 MED ORDER — SODIUM CHLORIDE 0.9 % IV SOLN
150.0000 mg | Freq: Once | INTRAVENOUS | Status: AC
  Administered 2022-05-13: 150 mg via INTRAVENOUS
  Filled 2022-05-13: qty 150

## 2022-05-13 MED ORDER — SODIUM CHLORIDE 0.9% FLUSH
10.0000 mL | INTRAVENOUS | Status: DC | PRN
  Administered 2022-05-13: 10 mL

## 2022-05-13 NOTE — Progress Notes (Signed)
Patient presents today for chemotherapy infusion.  Patient is in satisfactory condition with no new complaints voiced.  Vital signs are stable.  Labs reviewed by Dr. Delton Coombes during his office visit.  Platelets are 97 today.  MD made aware.  All other labs are within treatment parameters.  We will proceed with treatment per MD orders.  300 mL of urine collected prior to treatment.  Patient tolerated treatment well with no complaints voiced.  Patient left via wheelchair with wife in stable condition.  Vital signs stable at discharge.  Follow up as scheduled.

## 2022-05-13 NOTE — Patient Instructions (Addendum)
[facility] at [facility] Discharge Instructions   You were seen and examined today by Dr. Delton Coombes.  He reviewed the results of your lab work that is back, which are normal/stable. Your CBC results are pending.   We will proceed with your treatment today.  Return as scheduled.    Thank you for choosing [place] at [facility] to provide your oncology and hematology care.  To afford each patient quality time with our provider, please arrive at least 15 minutes before your scheduled appointment time.   If you have a lab appointment with the [facility] please come in thru the Main Entrance and check in at the main information desk.  You need to re-schedule your appointment should you arrive 10 or more minutes late.  We strive to give you quality time with our providers, and arriving late affects you and other patients whose appointments are after yours.  Also, if you no show three or more times for appointments you may be dismissed from the clinic at the providers discretion.     Again, thank you for choosing [facility].  Our hope is that these requests will decrease the amount of time that you wait before being seen by our physicians.       _____________________________________________________________  Should you have questions after your visit to [facility], please contact our office at [phone] and follow the prompts.  Our office hours are 8:00 a.m. and 4:30 p.m. Monday - Friday.  Please note that voicemails left after 4:00 p.m. may not be returned until the following business day.  We are closed weekends and major holidays.  You do have access to a nurse 24-7, just call the main number to the clinic [phone] and do not press any options, hold on the line and a nurse will answer the phone.    For prescription refill requests, have your pharmacy contact our office and allow 72 hours.    Due to Covid,  you will need to wear a mask upon entering the hospital. If you do not have a mask, a mask will be given to you at the Main Entrance upon arrival. For doctor visits, patients may have 1 support person age 54 or older with them. For treatment visits, patients can not have anyone with them due to social distancing guidelines and our immunocompromised population.

## 2022-05-13 NOTE — Patient Instructions (Signed)
[place]  Discharge Instructions: Thank you for choosing [facility] to provide your oncology and hematology care.  If you have a lab appointment with the [facility], please come in thru the Main Entrance and check in at the main information desk.  Wear comfortable clothing and clothing appropriate for easy access to any Portacath or PICC line.   We strive to give you quality time with your provider. You may need to reschedule your appointment if you arrive late (15 or more minutes).  Arriving late affects you and other patients whose appointments are after yours.  Also, if you miss three or more appointments without notifying the office, you may be dismissed from the clinic at the provider's discretion.      For prescription refill requests, have your pharmacy contact our office and allow 72 hours for refills to be completed.    Today you received the following chemotherapy and/or immunotherapy agents Imfinzi/Cisplatin/Gemzar.  Gemcitabine Injection What is this medication? GEMCITABINE (jem SYE ta been) treats some types of cancer. It works by slowing down the growth of cancer cells. This medicine may be used for other purposes; ask your health care provider or pharmacist if you have questions. COMMON BRAND NAME(S): Gemzar, Infugem What should I tell my care team before I take this medication? They need to know if you have any of these conditions: Blood disorders Infection Kidney disease Liver disease Lung or breathing disease, such as asthma or COPD Recent or ongoing radiation therapy An unusual or allergic reaction to gemcitabine, other medications, foods, dyes, or preservatives If you or your partner are pregnant or trying to get pregnant Breast-feeding How should I use this medication? This medication is injected into a vein. It is given by your care team in a hospital or clinic setting. Talk to your care team about the use of this  medication in children. Special care may be needed. Overdosage: If you think you have taken too much of this medicine contact a poison control center or emergency room at once. NOTE: This medicine is only for you. Do not share this medicine with others. What if I miss a dose? Keep appointments for follow-up doses. It is important not to miss your dose. Call your care team if you are unable to keep an appointment. What may interact with this medication? Interactions have not been studied. This list may not describe all possible interactions. Give your health care provider a list of all the medicines, herbs, non-prescription drugs, or dietary supplements you use. Also tell them if you smoke, drink alcohol, or use illegal drugs. Some items may interact with your medicine. What should I watch for while using this medication? Your condition will be monitored carefully while you are receiving this medication. This medication may make you feel generally unwell. This is not uncommon, as chemotherapy can affect healthy cells as well as cancer cells. Report any side effects. Continue your course of treatment even though you feel ill unless your care team tells you to stop. In some cases, you may be given additional medications to help with side effects. Follow all directions for their use. This medication may increase your risk of getting an infection. Call your care team for advice if you get a fever, chills, sore throat, or other symptoms of a cold or flu. Do not treat yourself. Try to avoid being around people who are sick. This medication may increase your risk to bruise or bleed. Call your care  team if you notice any unusual bleeding. Be careful brushing or flossing your teeth or using a toothpick because you may get an infection or bleed more easily. If you have any dental work done, tell your dentist you are receiving this medication. Avoid taking medications that contain aspirin, acetaminophen,  ibuprofen, naproxen, or ketoprofen unless instructed by your care team. These medications may hide a fever. Talk to your care team if you or your partner wish to become pregnant or think you might be pregnant. This medication can cause serious birth defects if taken during pregnancy and for 6 months after the last dose. A negative pregnancy test is required before starting this medication. A reliable form of contraception is recommended while taking this medication and for 6 months after the last dose. Talk to your care team about effective forms of contraception. Do not father a child while taking this medication and for 3 months after the last dose. Use a condom while having sex during this time period. Do not breastfeed while taking this medication and for at least 1 week after the last dose. This medication may cause infertility. Talk to your care team if you are concerned about your fertility. What side effects may I notice from receiving this medication? Side effects that you should report to your care team as soon as possible: Allergic reactions--skin rash, itching, hives, swelling of the face, lips, tongue, or throat Capillary leak syndrome--stomach or muscle pain, unusual weakness or fatigue, feeling faint or lightheaded, decrease in the amount of urine, swelling of the ankles, hands, or feet, trouble breathing Infection--fever, chills, cough, sore throat, wounds that don't heal, pain or trouble when passing urine, general feeling of discomfort or being unwell Liver injury--right upper belly pain, loss of appetite, nausea, light-colored stool, dark yellow or Kadie Balestrieri urine, yellowing skin or eyes, unusual weakness or fatigue Low red blood cell level--unusual weakness or fatigue, dizziness, headache, trouble breathing Lung injury--shortness of breath or trouble breathing, cough, spitting up blood, chest pain, fever Stomach pain, bloody diarrhea, pale skin, unusual weakness or fatigue, decrease in  the amount of urine, which may be signs of hemolytic uremic syndrome Sudden and severe headache, confusion, change in vision, seizures, which may be signs of posterior reversible encephalopathy syndrome (PRES) Unusual bruising or bleeding Side effects that usually do not require medical attention (report to your care team if they continue or are bothersome): Diarrhea Drowsiness Hair loss Nausea Pain, redness, or swelling with sores inside the mouth or throat Vomiting This list may not describe all possible side effects. Call your doctor for medical advice about side effects. You may report side effects to FDA at 1-800-FDA-1088. Where should I keep my medication? This medication is given in a hospital or clinic. It will not be stored at home. NOTE: This sheet is a summary. It may not cover all possible information. If you have questions about this medicine, talk to your doctor, pharmacist, or health care provider.  2023 Elsevier/Gold Standard (2021-11-26 00:00:00)   Cisplatin Injection What is this medication? CISPLATIN (SIS pla tin) treats some types of cancer. It works by slowing down the growth of cancer cells. This medicine may be used for other purposes; ask your health care provider or pharmacist if you have questions. COMMON BRAND NAME(S): Platinol, Platinol -AQ What should I tell my care team before I take this medication? They need to know if you have any of these conditions: Eye disease, vision problems Hearing problems Kidney disease Low blood counts, such  as low white cells, platelets, or red blood cells Tingling of the fingers or toes, or other nerve disorder An unusual or allergic reaction to cisplatin, carboplatin, oxaliplatin, other medications, foods, dyes, or preservatives If you or your partner are pregnant or trying to get pregnant Breast-feeding How should I use this medication? This medication is injected into a vein. It is given by your care team in a hospital  or clinic setting. Talk to your care team about the use of this medication in children. Special care may be needed. Overdosage: If you think you have taken too much of this medicine contact a poison control center or emergency room at once. NOTE: This medicine is only for you. Do not share this medicine with others. What if I miss a dose? Keep appointments for follow-up doses. It is important not to miss your dose. Call your care team if you are unable to keep an appointment. What may interact with this medication? Do not take this medication with any of the following: Live virus vaccines This medication may also interact with the following: Certain antibiotics, such as amikacin, gentamicin, neomycin, polymyxin B, streptomycin, tobramycin, vancomycin Foscarnet This list may not describe all possible interactions. Give your health care provider a list of all the medicines, herbs, non-prescription drugs, or dietary supplements you use. Also tell them if you smoke, drink alcohol, or use illegal drugs. Some items may interact with your medicine. What should I watch for while using this medication? Your condition will be monitored carefully while you are receiving this medication. You may need blood work done while taking this medication. This medication may make you feel generally unwell. This is not uncommon, as chemotherapy can affect healthy cells as well as cancer cells. Report any side effects. Continue your course of treatment even though you feel ill unless your care team tells you to stop. This medication may increase your risk of getting an infection. Call your care team for advice if you get a fever, chills, sore throat, or other symptoms of a cold or flu. Do not treat yourself. Try to avoid being around people who are sick. Avoid taking medications that contain aspirin, acetaminophen, ibuprofen, naproxen, or ketoprofen unless instructed by your care team. These medications may hide a  fever. This medication may increase your risk to bruise or bleed. Call your care team if you notice any unusual bleeding. Be careful brushing or flossing your teeth or using a toothpick because you may get an infection or bleed more easily. If you have any dental work done, tell your dentist you are receiving this medication. Drink fluids as directed while you are taking this medication. This will help protect your kidneys. Call your care team if you get diarrhea. Do not treat yourself. Talk to your care team if you or your partner wish to become pregnant or think you might be pregnant. This medication can cause serious birth defects if taken during pregnancy and for 14 months after the last dose. A negative pregnancy test is required before starting this medication. A reliable form of contraception is recommended while taking this medication and for 14 months after the last dose. Talk to your care team about effective forms of contraception. Do not father a child while taking this medication and for 11 months after the last dose. Use a condom during sex during this time period. Do not breast-feed while taking this medication. This medication may cause infertility. Talk to your care team if you are concerned about  your fertility. What side effects may I notice from receiving this medication? Side effects that you should report to your care team as soon as possible: Allergic reactions--skin rash, itching, hives, swelling of the face, lips, tongue, or throat Eye pain, change in vision, vision loss Hearing loss, ringing in ears Infection--fever, chills, cough, sore throat, wounds that don't heal, pain or trouble when passing urine, general feeling of discomfort or being unwell Kidney injury--decrease in the amount of urine, swelling of the ankles, hands, or feet Low red blood cell level--unusual weakness or fatigue, dizziness, headache, trouble breathing Painful swelling, warmth, or redness of the skin,  blisters or sores at the infusion site Pain, tingling, or numbness in the hands or feet Unusual bruising or bleeding Side effects that usually do not require medical attention (report to your care team if they continue or are bothersome): Hair loss Nausea Vomiting This list may not describe all possible side effects. Call your doctor for medical advice about side effects. You may report side effects to FDA at 1-800-FDA-1088. Where should I keep my medication? This medication is given in a hospital or clinic. It will not be stored at home. NOTE: This sheet is a summary. It may not cover all possible information. If you have questions about this medicine, talk to your doctor, pharmacist, or health care provider.  2023 Elsevier/Gold Standard (2021-11-24 00:00:00)   Durvalumab Injection What is this medication? DURVALUMAB (dur VAL ue mab) treats some types of cancer. It works by helping your immune system slow or stop the spread of cancer cells. It is a monoclonal antibody. This medicine may be used for other purposes; ask your health care provider or pharmacist if you have questions. COMMON BRAND NAME(S): IMFINZI What should I tell my care team before I take this medication? They need to know if you have any of these conditions: Allogeneic stem cell transplant (uses someone else's stem cells) Autoimmune diseases, such as Crohn disease, ulcerative colitis, lupus History of chest radiation Nervous system problems, such as Guillain-Barre syndrome, myasthenia gravis Organ transplant An unusual or allergic reaction to durvalumab, other medications, foods, dyes, or preservatives Pregnant or trying to get pregnant Breast-feeding How should I use this medication? This medication is infused into a vein. It is given by your care team in a hospital or clinic setting. A special MedGuide will be given to you before each treatment. Be sure to read this information carefully each time. Talk to your  care team about the use of this medication in children. Special care may be needed. Overdosage: If you think you have taken too much of this medicine contact a poison control center or emergency room at once. NOTE: This medicine is only for you. Do not share this medicine with others. What if I miss a dose? Keep appointments for follow-up doses. It is important not to miss your dose. Call your care team if you are unable to keep an appointment. What may interact with this medication? Interactions have not been studied. This list may not describe all possible interactions. Give your health care provider a list of all the medicines, herbs, non-prescription drugs, or dietary supplements you use. Also tell them if you smoke, drink alcohol, or use illegal drugs. Some items may interact with your medicine. What should I watch for while using this medication? Your condition will be monitored carefully while you are receiving this medication. You may need blood work while taking this medication. This medication may cause serious skin reactions. They  can happen weeks to months after starting the medication. Contact your care team right away if you notice fevers or flu-like symptoms with a rash. The rash may be red or purple and then turn into blisters or peeling of the skin. You may also notice a red rash with swelling of the face, lips, or lymph nodes in your neck or under your arms. Tell your care team right away if you have any change in your eyesight. Talk to your care team if you may be pregnant. Serious birth defects can occur if you take this medication during pregnancy and for 3 months after the last dose. You will need a negative pregnancy test before starting this medication. Contraception is recommended while taking this medication and for 3 months after the last dose. Your care team can help you find the option that works for you. Do not breastfeed while taking this medication and for 3 months after  the last dose. What side effects may I notice from receiving this medication? Side effects that you should report to your care team as soon as possible: Allergic reactions--skin rash, itching, hives, swelling of the face, lips, tongue, or throat Dry cough, shortness of breath or trouble breathing Eye pain, redness, irritation, or discharge with blurry or decreased vision Heart muscle inflammation--unusual weakness or fatigue, shortness of breath, chest pain, fast or irregular heartbeat, dizziness, swelling of the ankles, feet, or hands Hormone gland problems--headache, sensitivity to light, unusual weakness or fatigue, dizziness, fast or irregular heartbeat, increased sensitivity to cold or heat, excessive sweating, constipation, hair loss, increased thirst or amount of urine, tremors or shaking, irritability Infusion reactions--chest pain, shortness of breath or trouble breathing, feeling faint or lightheaded Kidney injury (glomerulonephritis)--decrease in the amount of urine, red or dark Macaulay Reicher urine, foamy or bubbly urine, swelling of the ankles, hands, or feet Liver injury--right upper belly pain, loss of appetite, nausea, light-colored stool, dark yellow or Nicholl Onstott urine, yellowing skin or eyes, unusual weakness or fatigue Pain, tingling, or numbness in the hands or feet, muscle weakness, change in vision, confusion or trouble speaking, loss of balance or coordination, trouble walking, seizures Rash, fever, and swollen lymph nodes Redness, blistering, peeling, or loosening of the skin, including inside the mouth Sudden or severe stomach pain, bloody diarrhea, fever, nausea, vomiting Side effects that usually do not require medical attention (report these to your care team if they continue or are bothersome): Bone, joint, or muscle pain Diarrhea Fatigue Loss of appetite Nausea Skin rash This list may not describe all possible side effects. Call your doctor for medical advice about side  effects. You may report side effects to FDA at 1-800-FDA-1088. Where should I keep my medication? This medication is given in a hospital or clinic. It will not be stored at home. NOTE: This sheet is a summary. It may not cover all possible information. If you have questions about this medicine, talk to your doctor, pharmacist, or health care provider.  2023 Elsevier/Gold Standard (2021-01-15 00:00:00)        To help prevent nausea and vomiting after your treatment, we encourage you to take your nausea medication as directed.  BELOW ARE SYMPTOMS THAT SHOULD BE REPORTED IMMEDIATELY: *FEVER GREATER THAN 100.4 F (38 C) OR HIGHER *CHILLS OR SWEATING *NAUSEA AND VOMITING THAT IS NOT CONTROLLED WITH YOUR NAUSEA MEDICATION *UNUSUAL SHORTNESS OF BREATH *UNUSUAL BRUISING OR BLEEDING *URINARY PROBLEMS (pain or burning when urinating, or frequent urination) *BOWEL PROBLEMS (unusual diarrhea, constipation, pain near the anus) TENDERNESS IN  MOUTH AND THROAT WITH OR WITHOUT PRESENCE OF ULCERS (sore throat, sores in mouth, or a toothache) UNUSUAL RASH, SWELLING OR PAIN  UNUSUAL VAGINAL DISCHARGE OR ITCHING   Items with * indicate a potential emergency and should be followed up as soon as possible or go to the Emergency Department if any problems should occur.  Please show the CHEMOTHERAPY ALERT CARD or IMMUNOTHERAPY ALERT CARD at check-in to the Emergency Department and triage nurse.  Should you have questions after your visit or need to cancel or reschedule your appointment, please contact [facility] [phone]  and follow the prompts.  Office hours are 8:00 a.m. to 4:30 p.m. Monday - Friday. Please note that voicemails left after 4:00 p.m. may not be returned until the following business day.  We are closed weekends and major holidays. You have access to a nurse at all times for urgent questions. Please call the main number to the clinic [phone] and follow the  prompts.  For any non-urgent questions, you may also contact your provider using MyChart. We now offer e-Visits for anyone 38 and older to request care online for non-urgent symptoms. For details visit [email or web address].   Also download the MyChart app! Go to the app store, search "MyChart", open the app, select [facility], and log in with your MyChart username and password.  Masks are optional in the cancer centers. If you would like for your care team to wear a mask while they are taking care of you, please let them know. You may have one support person who is at least 66 years old accompany you for your appointments.

## 2022-05-13 NOTE — Progress Notes (Signed)
Imfinzi dose confirmed at 1500 mg IVPB q 28 days (day 1 of each cycle).  T.O. Dr Rhys Martini, PharmD

## 2022-05-22 ENCOUNTER — Encounter: Payer: Self-pay | Admitting: Hematology

## 2022-05-22 ENCOUNTER — Encounter (HOSPITAL_COMMUNITY): Payer: Self-pay | Admitting: Hematology

## 2022-05-22 NOTE — Progress Notes (Signed)
[place], [place] [address]   CLINIC:  Medical Oncology/Hematology  PCP:  [facility], [place] / [place] [address] [phone]   REASON FOR VISIT:  Follow-up for intrahepatic cholangiocarcinoma  PRIOR THERAPY: none  NGS Results: not done  CURRENT THERAPY: Cisplatin + Gemcitabine D1, D15 q 28d  BRIEF ONCOLOGIC HISTORY:  Oncology History  Cholangiocarcinoma ([place])  02/28/2021 Initial Diagnosis   Cholangiocarcinoma ([facility])   07/17/2021 Cancer Staging   Staging form: Perihilar Bile Ducts, AJCC 8th Edition - Clinical stage from 07/17/2021: Stage IVB (cTX, cNX, pM1) - Signed by Derek Jack, MD on 07/17/2021 Stage prefix: Initial diagnosis   07/22/2021 - 04/01/2022 Chemotherapy   Patient is on Treatment Plan : BILIARY TRACT Cisplatin + Gemcitabine D1,15 + Imfinzi day 1 q28d     07/22/2021 -  Chemotherapy   Patient is on Treatment Plan : BILIARY TRACT Cisplatin + Gemcitabine D1,8 q21d       CANCER STAGING:  Cancer Staging  Cholangiocarcinoma ([facility]) Staging form: Perihilar Bile Ducts, AJCC 8th Edition - Clinical stage from 07/17/2021: Stage IVB (cTX, cNX, pM1) - Signed by Derek Jack, MD on 07/17/2021   INTERVAL HISTORY:  Mr. MATIN MATTIOLI, a 66 y.o. male, seen for follow-up of cholangiocarcinoma, toxicity assessment prior to chemotherapy.  Reports energy levels of 25%.  Complains of gas with burping and flatus.  He started Pepcid and gets relief until afternoon when he takes it in the morning.  REVIEW OF SYSTEMS:  Review of Systems  Constitutional:  Negative for appetite change (improved), fatigue (improved) and unexpected weight change (+2 lbs).  HENT:   Negative for tinnitus.   Cardiovascular:  Negative for leg swelling.  Gastrointestinal:  Positive for constipation and nausea. Negative for diarrhea.  Genitourinary:  Positive for frequency.   Skin:  Negative for rash.  Neurological:  Positive  for dizziness. Negative for numbness.  All other systems reviewed and are negative.   PAST MEDICAL/SURGICAL HISTORY:  Past Medical History:  Diagnosis Date   Cancer ([facility])    CHF (congestive heart failure) (HCC)    Chronic pain    neck, back, knees   Class 1 obesity due to excess calories with body mass index (BMI) of 31.0 to 31.9 in adult    Claudication [facility])    Coronary artery disease    DDD (degenerative disc disease), cervical    Diabetes mellitus ([facility])    Dyslipidemia    Hypertension    Port-A-Cath in place 07/20/2021   Tobacco dependence    Past Surgical History:  Procedure Laterality Date   BILIARY BRUSHING N/A 02/25/2021   Procedure: BILIARY BRUSHING;  Surgeon: Rogene Houston, MD;  Location: [facility];  Service: Gastroenterology;  Laterality: N/A;   BILIARY STENT PLACEMENT N/A 02/25/2021   Procedure: BILIARY STENT PLACEMENT;  Surgeon: Rogene Houston, MD;  Location: [facility];  Service: Gastroenterology;  Laterality: N/A;   BILIARY STENT PLACEMENT  02/27/2021   Procedure: BILIARY STENT PLACEMENT (10FR x 9cm) IN THE RIGHT SYSTEM;  Surgeon: Rogene Houston, MD;  Location: [facility];  Service: Gastroenterology;;   BREAST SURGERY Left    benign lump- in his 2s.   CARDIAC CATHETERIZATION     ERCP N/A 02/25/2021   Procedure: ENDOSCOPIC RETROGRADE CHOLANGIOPANCREATOGRAPHY (ERCP);  Surgeon: Rogene Houston, MD;  Location: [facility];  Service: Gastroenterology;  Laterality: N/A;   ERCP N/A 02/27/2021   Procedure: ENDOSCOPIC RETROGRADE CHOLANGIOPANCREATOGRAPHY (ERCP);  Surgeon: Rogene Houston,  MD;  Location: [facility];  Service: Gastroenterology;  Laterality: N/A;   IR IMAGING GUIDED PORT INSERTION  07/21/2021   KNEE SURGERY Left    x2   LEFT HEART CATH AND CORONARY ANGIOGRAPHY N/A 09/08/2021   Procedure: LEFT HEART CATH AND CORONARY ANGIOGRAPHY;  Surgeon: Early Osmond, MD;  Location: [facility];  Service: Cardiovascular;  Laterality: N/A;   SPHINCTEROTOMY N/A 02/25/2021    Procedure: SPHINCTEROTOMY;  Surgeon: Rogene Houston, MD;  Location: [facility];  Service: Gastroenterology;  Laterality: N/A;   STENT REMOVAL  02/27/2021   Procedure: STENT REMOVAL (8.5Fr x 9cm);  Surgeon: Rogene Houston, MD;  Location: [facility];  Service: Gastroenterology;;    SOCIAL HISTORY:  Social History   Socioeconomic History   Marital status: Married    Spouse name: Not on file   Number of children: Not on file   Years of education: Not on file   Highest education level: Not on file  Occupational History   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  Tobacco Use   Smoking status: Every Day    Packs/day: 0.50    Years: 56.00    Total pack years: 28.00    Types: Cigarettes   Smokeless tobacco: Never  Vaping Use   Vaping Use: Never used  Substance and Sexual Activity   Alcohol use: Not Currently    Comment: stopped 2.5 years ago 03/11/21   Drug use: Never   Sexual activity: Not on file  Other Topics Concern   Not on file  Social History Narrative   Not on file   Social Determinants of Health   Financial Resource Strain: Medium Risk (03/07/2021)   Overall Financial Resource Strain (CARDIA)    Difficulty of Paying Living Expenses: Somewhat hard  Food Insecurity: No Food Insecurity (03/07/2021)   Hunger Vital Sign    Worried About Running Out of Food in the Last Year: Never true    [place] in the Last Year: Never true  Transportation Needs: No Transportation Needs (03/07/2021)   PRAPARE - Hydrologist (Medical): No    Lack of Transportation (Non-Medical): No  Physical Activity: Insufficiently Active (03/07/2021)   Exercise Vital Sign    Days of Exercise per Week: 2 days    Minutes of Exercise per Session: 20 min  Stress: No Stress Concern Present (03/07/2021)   [place]    Feeling of Stress : Only a little  Social Connections: Moderately Integrated (03/07/2021)   Social  Connection and Isolation Panel [NHANES]    Frequency of Communication with Friends and Family: More than three times a week    Frequency of Social Gatherings with Friends and Family: More than three times a week    Attends Religious Services: More than 4 times per year    Active Member of [employer] or Organizations: No    Attends Archivist Meetings: Never    Marital Status: Married  Human resources officer Violence: Not At Risk (03/07/2021)   Humiliation, Afraid, Rape, and Kick questionnaire    Fear of Current or Ex-Partner: No    Emotionally Abused: No    Physically Abused: No    Sexually Abused: No    FAMILY HISTORY:  Family History  Problem Relation Age of Onset   CAD Mother    Cancer Neg Hx     CURRENT MEDICATIONS:  Current Outpatient Medications  Medication Sig Dispense Refill   acetaminophen (  TYLENOL) 500 MG tablet Take 500 mg by mouth every 6 (six) hours as needed for moderate pain or headache.     albuterol (VENTOLIN HFA) 108 (90 Base) MCG/ACT inhaler Inhale 2 puffs into the lungs every 6 (six) hours as needed for wheezing or shortness of breath. 6.7 g 1   atorvastatin (LIPITOR) 40 MG tablet Take 1 tablet (40 mg total) by mouth daily at 6 PM. 90 tablet 3   CISPLATIN IV Inject into the vein once a week. Days 1, 8 every 21 days     Durvalumab (IMFINZI IV) Inject into the vein every 21 ( twenty-one) days.     famotidine (PEPCID) 20 MG tablet Take 20 mg by mouth daily.     ferrous sulfate (FERROUSUL) 325 (65 FE) MG tablet Take 1 tablet (325 mg total) by mouth daily with breakfast.     furosemide (LASIX) 20 MG tablet Take 20 mg daily as needed for swelling 90 tablet 1   Gemcitabine HCl (GEMZAR IV) Inject into the vein once a week. Days 1, 8 every 21 days     Ginseng 100 MG CAPS Take 1 capsule by mouth daily.     loratadine (CLARITIN) 10 MG tablet Take 10 mg by mouth daily. Takes for a few days after shot to boost WBC. About every 2nd treatment     midodrine (PROAMATINE) 5 MG  tablet Take 1 tablet (5 mg total) by mouth 3 (three) times daily with meals. 270 tablet 3   pantoprazole (PROTONIX) 40 MG tablet Take 1 tablet (40 mg total) by mouth daily. 90 tablet 3   tamsulosin (FLOMAX) 0.4 MG CAPS capsule TAKE (2) CAPSULES BY MOUTH AT BEDTIME. 60 capsule 6   metFORMIN (GLUCOPHAGE) 500 MG tablet Take 1 tablet (500 mg total) by mouth 2 (two) times daily with a meal. 60 tablet 0   nitroGLYCERIN (NITROSTAT) 0.4 MG SL tablet Place 1 tablet (0.4 mg total) under the tongue every 5 (five) minutes as needed for chest pain. 25 tablet 3   prochlorperazine (COMPAZINE) 10 MG tablet TAKE (1) TABLET BY MOUTH EVERY (6) HOURS AS NEEDED. 90 tablet 3   No current facility-administered medications for this visit.    ALLERGIES:  No Known Allergies  PHYSICAL EXAM:  Performance status (ECOG): 0 - Asymptomatic  There were no vitals filed for this visit. Wt Readings from Last 3 Encounters:  05/13/22 150 lb 12.8 oz (68.4 kg)  04/27/22 150 lb 6.4 oz (68.2 kg)  04/09/22 151 lb (68.5 kg)   Physical Exam Vitals reviewed.  Constitutional:      Appearance: Normal appearance.  Cardiovascular:     Rate and Rhythm: Normal rate and regular rhythm.     Pulses: Normal pulses.     Heart sounds: Normal heart sounds.  Pulmonary:     Effort: Pulmonary effort is normal.     Breath sounds: Normal breath sounds.  Abdominal:     Palpations: Abdomen is soft. There is no mass.     Tenderness: There is no abdominal tenderness.  Musculoskeletal:     Right lower leg: No edema.     Left lower leg: No edema.  Neurological:     General: No focal deficit present.     Mental Status: He is alert and oriented to person, place, and time.  Psychiatric:        Mood and Affect: Mood normal.        Behavior: Behavior normal.     LABORATORY DATA:  I have  reviewed the labs as listed.     Latest Ref Rng & Units 05/13/2022    7:57 AM 04/27/2022    7:54 AM 04/09/2022    9:31 PM  CBC  WBC 4.0 - 10.5 K/uL 4.7   5.2  4.1   Hemoglobin 13.0 - 17.0 g/dL 9.9  10.2  9.5   Hematocrit 39.0 - 52.0 % 29.8  30.4  27.8   Platelets 150 - 400 K/uL 97  106  41       Latest Ref Rng & Units 05/13/2022    7:57 AM 04/27/2022    7:54 AM 04/09/2022    9:31 PM  CMP  Glucose 70 - 99 mg/dL 203  174  87   BUN 8 - 23 mg/dL 12  13  17    Creatinine 0.61 - 1.24 mg/dL 1.08  1.20  1.43   Sodium 135 - 145 mmol/L 132  132  128   Potassium 3.5 - 5.1 mmol/L 4.2  3.8  4.0   Chloride 98 - 111 mmol/L 102  103  99   CO2 22 - 32 mmol/L 23  23  22    Calcium 8.9 - 10.3 mg/dL 8.6  8.9  8.4   Total Protein 6.5 - 8.1 g/dL 6.5  6.9  6.5   Total Bilirubin 0.3 - 1.2 mg/dL 1.4  1.8  2.4   Alkaline Phos 38 - 126 U/L 219  301  303   AST 15 - 41 U/L 49  59  192   ALT 0 - 44 U/L 33  36  127     DIAGNOSTIC IMAGING:  I have independently reviewed the scans and discussed with the patient. No results found.   ASSESSMENT:  Metastatic cholangiocarcinoma: - 02/2021 initial diagnosis presentation with painless jaundice - ERCP with sphincterotomy, subsequent ERCP with plastic biliary stent placement. - MRI-2 cm mass involving the common hepatic duct with upstream biliary dilatation. - 03/2021 consult at [facility] with GI and surgery-confirm diagnosis of perihilar cholangiocarcinoma - 03/21/2021-biopsy/pathology, ERCP-common hepatic duct cytology and brushings performed.  EUS-1 enlarged lymph node versus tumor implant in the gastrohepatic ligament-negative for malignancy.  Single biliary plastic stent removed and replaced with the plastic stent in the right and left ducts. - 04/17/2021-right portal vein embolization to promote left hepatic hypertrophy. - 07/02/2021-diagnostic laparoscopy with multiple areas concerning for metastatic blocks identified in the abdomen and peritoneal surfaces.  6 separate biopsies obtained. - Pathology consistent with metastatic carcinoma. - 37 pound weight loss in the last 5 months. - Cycle 1 of gemcitabine, cisplatin,  durvalumab on 07/22/2021.  2.  Social history/family: - Lives at home with his wife.  He worked as a Development worker, community. - Current active smoker, 1 pack/day since age 87. - Maternal grandmother had cancer.  Maternal uncle had brain tumor.   PLAN:  Metastatic cholangiocarcinoma: - CT CAP on 03/16/2022 showed stable exam with no clear evidence of metastatic disease in the chest, abdomen or pelvis. - CA 19-9 has been up-and-down.  Latest value is 450. - I have reviewed his labs which showed elevated AST of 49 and alk phos 219.  Total bili is 1.4.  CBC was grossly normal with thrombocytopenia of 97.  ANC was normal. - He will proceed with maintenance gemcitabine and cisplatin today and in 2 weeks. - Gemcitabine dose was reduced to 400 mg/m2 for better tolerability and fatigue.  RTC 4 weeks for follow-up with repeat CT CAP with contrast and CA 19-9. - He is having  gas with burping and flatus.  Slight relief after Pepcid started.  Recommend increasing Pepcid to twice daily.  2.  Weight loss: - He is eating well.  Weight is stable.  3.  Urinary problems: - Continue Flomax 0.8 mg daily.  4.  Hypotension: - Continue midodrine 5 mg 3 times daily.  Blood pressure is good.   Orders placed this encounter:  Orders Placed This Encounter  Procedures   CT CHEST ABDOMEN PELVIS W CONTRAST   CBC with Differential   Comprehensive metabolic panel   Magnesium      Derek Jack, MD [facility] [phone]

## 2022-05-27 ENCOUNTER — Inpatient Hospital Stay: Payer: Medicare Other

## 2022-05-27 ENCOUNTER — Inpatient Hospital Stay: Payer: Medicare Other | Admitting: Hematology

## 2022-05-27 VITALS — BP 151/90 | HR 86 | Temp 97.5°F | Resp 18 | Ht 70.0 in | Wt 157.4 lb

## 2022-05-27 DIAGNOSIS — C221 Intrahepatic bile duct carcinoma: Secondary | ICD-10-CM

## 2022-05-27 DIAGNOSIS — Z95828 Presence of other vascular implants and grafts: Secondary | ICD-10-CM

## 2022-05-27 DIAGNOSIS — Z5112 Encounter for antineoplastic immunotherapy: Secondary | ICD-10-CM | POA: Diagnosis not present

## 2022-05-27 LAB — CBC WITH DIFFERENTIAL/PLATELET
Abs Immature Granulocytes: 0.02 10*3/uL (ref 0.00–0.07)
Basophils Absolute: 0 10*3/uL (ref 0.0–0.1)
Basophils Relative: 0 %
Eosinophils Absolute: 0 10*3/uL (ref 0.0–0.5)
Eosinophils Relative: 1 %
HCT: 30.1 % — ABNORMAL LOW (ref 39.0–52.0)
Hemoglobin: 10.1 g/dL — ABNORMAL LOW (ref 13.0–17.0)
Immature Granulocytes: 0 %
Lymphocytes Relative: 16 %
Lymphs Abs: 0.8 10*3/uL (ref 0.7–4.0)
MCH: 34.5 pg — ABNORMAL HIGH (ref 26.0–34.0)
MCHC: 33.6 g/dL (ref 30.0–36.0)
MCV: 102.7 fL — ABNORMAL HIGH (ref 80.0–100.0)
Monocytes Absolute: 0.6 10*3/uL (ref 0.1–1.0)
Monocytes Relative: 13 %
Neutro Abs: 3.2 10*3/uL (ref 1.7–7.7)
Neutrophils Relative %: 70 %
Platelets: 75 10*3/uL — ABNORMAL LOW (ref 150–400)
RBC: 2.93 MIL/uL — ABNORMAL LOW (ref 4.22–5.81)
RDW: 15.8 % — ABNORMAL HIGH (ref 11.5–15.5)
WBC: 4.6 10*3/uL (ref 4.0–10.5)
nRBC: 0 % (ref 0.0–0.2)

## 2022-05-27 LAB — COMPREHENSIVE METABOLIC PANEL
ALT: 41 U/L (ref 0–44)
AST: 54 U/L — ABNORMAL HIGH (ref 15–41)
Albumin: 2.8 g/dL — ABNORMAL LOW (ref 3.5–5.0)
Alkaline Phosphatase: 236 U/L — ABNORMAL HIGH (ref 38–126)
Anion gap: 8 (ref 5–15)
BUN: 14 mg/dL (ref 8–23)
CO2: 24 mmol/L (ref 22–32)
Calcium: 8.6 mg/dL — ABNORMAL LOW (ref 8.9–10.3)
Chloride: 99 mmol/L (ref 98–111)
Creatinine, Ser: 1.17 mg/dL (ref 0.61–1.24)
GFR, Estimated: >60 mL/min (ref >60)
Glucose, Bld: 161 mg/dL — ABNORMAL HIGH (ref 70–99)
Potassium: 4.1 mmol/L (ref 3.5–5.1)
Sodium: 131 mmol/L — ABNORMAL LOW (ref 135–145)
Total Bilirubin: 1.3 mg/dL — ABNORMAL HIGH (ref 0.3–1.2)
Total Protein: 6.3 g/dL — ABNORMAL LOW (ref 6.5–8.1)

## 2022-05-27 LAB — MAGNESIUM: Magnesium: 1.7 mg/dL (ref 1.7–2.4)

## 2022-05-27 MED ORDER — SODIUM CHLORIDE 0.9 % IV SOLN
400.0000 mg/m2 | Freq: Once | INTRAVENOUS | Status: AC
  Administered 2022-05-27: 760 mg via INTRAVENOUS
  Filled 2022-05-27: qty 19.99

## 2022-05-27 MED ORDER — SODIUM CHLORIDE 0.9 % IV SOLN
25.0000 mg/m2 | Freq: Once | INTRAVENOUS | Status: AC
  Administered 2022-05-27: 46 mg via INTRAVENOUS
  Filled 2022-05-27: qty 46

## 2022-05-27 MED ORDER — PALONOSETRON HCL INJECTION 0.25 MG/5ML
0.2500 mg | Freq: Once | INTRAVENOUS | Status: AC
  Administered 2022-05-27: 0.25 mg via INTRAVENOUS
  Filled 2022-05-27: qty 5

## 2022-05-27 MED ORDER — MAGNESIUM SULFATE 2 GM/50ML IV SOLN
2.0000 g | Freq: Once | INTRAVENOUS | Status: AC
  Administered 2022-05-27: 2 g via INTRAVENOUS
  Filled 2022-05-27: qty 50

## 2022-05-27 MED ORDER — SODIUM CHLORIDE 0.9% FLUSH
10.0000 mL | INTRAVENOUS | Status: DC | PRN
  Administered 2022-05-27: 10 mL

## 2022-05-27 MED ORDER — POTASSIUM CHLORIDE IN NACL 20-0.9 MEQ/L-% IV SOLN
Freq: Once | INTRAVENOUS | Status: AC
  Filled 2022-05-27: qty 1000

## 2022-05-27 MED ORDER — SODIUM CHLORIDE 0.9% FLUSH
10.0000 mL | INTRAVENOUS | Status: AC
  Administered 2022-05-27: 10 mL

## 2022-05-27 MED ORDER — HEPARIN SOD (PORK) LOCK FLUSH 100 UNIT/ML IV SOLN
500.0000 [IU] | Freq: Once | INTRAVENOUS | Status: AC | PRN
  Administered 2022-05-27: 500 [IU]

## 2022-05-27 MED ORDER — SODIUM CHLORIDE 0.9 % IV SOLN
150.0000 mg | Freq: Once | INTRAVENOUS | Status: AC
  Administered 2022-05-27: 150 mg via INTRAVENOUS
  Filled 2022-05-27: qty 5

## 2022-05-27 MED ORDER — SODIUM CHLORIDE 0.9 % IV SOLN
10.0000 mg | Freq: Once | INTRAVENOUS | Status: AC
  Administered 2022-05-27: 10 mg via INTRAVENOUS
  Filled 2022-05-27: qty 1

## 2022-05-27 MED ORDER — SODIUM CHLORIDE 0.9 % IV SOLN: Freq: Once | INTRAVENOUS | Status: AC

## 2022-05-27 NOTE — Progress Notes (Signed)
Patient presents today for Cisplatin/Gemzar. Vital signs are within parameters for treatment. Labs pending. MAR reviewed and updated. Patient denies any significant changes since his last treatment.

## 2022-05-27 NOTE — Patient Instructions (Signed)
[facility]  Discharge Instructions: Thank you for choosing [facility] to provide your oncology and hematology care.  If you have a lab appointment with the [facility], please come in thru the Main Entrance and check in at the main information desk.  Wear comfortable clothing and clothing appropriate for easy access to any Portacath or PICC line.   We strive to give you quality time with your provider. You may need to reschedule your appointment if you arrive late (15 or more minutes).  Arriving late affects you and other patients whose appointments are after yours.  Also, if you miss three or more appointments without notifying the office, you may be dismissed from the clinic at the provider's discretion.      For prescription refill requests, have your pharmacy contact our office and allow 72 hours for refills to be completed.    Today you received the following chemotherapy and/or immunotherapy agents Cisplatin Gemzar      To help prevent nausea and vomiting after your treatment, we encourage you to take your nausea medication as directed.  BELOW ARE SYMPTOMS THAT SHOULD BE REPORTED IMMEDIATELY: *FEVER GREATER THAN 100.4 F (38 C) OR HIGHER *CHILLS OR SWEATING *NAUSEA AND VOMITING THAT IS NOT CONTROLLED WITH YOUR NAUSEA MEDICATION *UNUSUAL SHORTNESS OF BREATH *UNUSUAL BRUISING OR BLEEDING *URINARY PROBLEMS (pain or burning when urinating, or frequent urination) *BOWEL PROBLEMS (unusual diarrhea, constipation, pain near the anus) TENDERNESS IN MOUTH AND THROAT WITH OR WITHOUT PRESENCE OF ULCERS (sore throat, sores in mouth, or a toothache) UNUSUAL RASH, SWELLING OR PAIN  UNUSUAL VAGINAL DISCHARGE OR ITCHING   Items with * indicate a potential emergency and should be followed up as soon as possible or go to the Emergency Department if any problems should occur.  Please show the CHEMOTHERAPY ALERT CARD or IMMUNOTHERAPY ALERT CARD at check-in to the  Emergency Department and triage nurse.  Should you have questions after your visit or need to cancel or reschedule your appointment, please contact [facility] [phone]  and follow the prompts.  Office hours are 8:00 a.m. to 4:30 p.m. Monday - Friday. Please note that voicemails left after 4:00 p.m. may not be returned until the following business day.  We are closed weekends and major holidays. You have access to a nurse at all times for urgent questions. Please call the main number to the clinic [phone] and follow the prompts.  For any non-urgent questions, you may also contact your provider using MyChart. We now offer e-Visits for anyone 65 and older to request care online for non-urgent symptoms. For details visit [email or web address].   Also download the MyChart app! Go to the app store, search "MyChart", open the app, select [facility], and log in with your MyChart username and password.  Masks are optional in the cancer centers. If you would like for your care team to wear a mask while they are taking care of you, please let them know. You may have one support person who is at least 66 years old accompany you for your appointments.

## 2022-05-27 NOTE — Progress Notes (Signed)
Patient voided 200 cc urine.  Labs reveiwed and Platelets noted to be 109, MD notified.  Message received from Dr. Delton Coombes okay to proceed with treatment.  Treatment given today per MD orders.  Stable during infusion without adverse affects.  Vital signs stable.  No complaints at this time.  Discharge from clinic via wheelchair in stable condition.  Alert and oriented X 3.  Follow up with [facility] as scheduled.

## 2022-05-29 LAB — CANCER ANTIGEN 19-9: CA 19-9: 426 U/mL — ABNORMAL HIGH (ref 0–35)

## 2022-05-31 ENCOUNTER — Other Ambulatory Visit: Payer: Self-pay

## 2022-06-03 ENCOUNTER — Other Ambulatory Visit: Payer: Self-pay

## 2022-06-04 ENCOUNTER — Ambulatory Visit (HOSPITAL_COMMUNITY)
Admission: RE | Admit: 2022-06-04 | Discharge: 2022-06-04 | Disposition: A | Discharge status: Home / Self Care | Payer: Medicare Other | Source: Ambulatory Visit | Attending: Hematology | Admitting: Hematology

## 2022-06-04 DIAGNOSIS — C221 Intrahepatic bile duct carcinoma: Secondary | ICD-10-CM | POA: Diagnosis present

## 2022-06-04 MED ORDER — IOHEXOL 300 MG/ML  SOLN
100.0000 mL | Freq: Once | INTRAMUSCULAR | Status: AC | PRN
  Administered 2022-06-04: 100 mL via INTRAVENOUS

## 2022-06-07 ENCOUNTER — Other Ambulatory Visit: Payer: Self-pay | Admitting: *Deleted

## 2022-06-07 DIAGNOSIS — I513 Intracardiac thrombosis, not elsewhere classified: Secondary | ICD-10-CM

## 2022-06-08 ENCOUNTER — Other Ambulatory Visit: Payer: Self-pay

## 2022-06-08 ENCOUNTER — Encounter (HOSPITAL_BASED_OUTPATIENT_CLINIC_OR_DEPARTMENT_OTHER): Payer: Self-pay

## 2022-06-08 ENCOUNTER — Ambulatory Visit (INDEPENDENT_AMBULATORY_CARE_PROVIDER_SITE_OTHER): Payer: Medicare Other

## 2022-06-08 ENCOUNTER — Emergency Department (HOSPITAL_BASED_OUTPATIENT_CLINIC_OR_DEPARTMENT_OTHER)
Admission: EM | Admit: 2022-06-08 | Discharge: 2022-06-08 | Disposition: A | Discharge status: Home / Self Care | Payer: Medicare Other | Attending: Emergency Medicine | Admitting: Emergency Medicine

## 2022-06-08 ENCOUNTER — Telehealth: Payer: Self-pay | Admitting: Cardiovascular Disease

## 2022-06-08 DIAGNOSIS — E119 Type 2 diabetes mellitus without complications: Secondary | ICD-10-CM | POA: Diagnosis not present

## 2022-06-08 DIAGNOSIS — I110 Hypertensive heart disease with heart failure: Secondary | ICD-10-CM | POA: Diagnosis not present

## 2022-06-08 DIAGNOSIS — I509 Heart failure, unspecified: Secondary | ICD-10-CM | POA: Insufficient documentation

## 2022-06-08 DIAGNOSIS — R7401 Elevation of levels of liver transaminase levels: Secondary | ICD-10-CM | POA: Diagnosis not present

## 2022-06-08 DIAGNOSIS — D696 Thrombocytopenia, unspecified: Secondary | ICD-10-CM | POA: Diagnosis not present

## 2022-06-08 DIAGNOSIS — Z8509 Personal history of malignant neoplasm of other digestive organs: Secondary | ICD-10-CM | POA: Insufficient documentation

## 2022-06-08 DIAGNOSIS — I513 Intracardiac thrombosis, not elsewhere classified: Secondary | ICD-10-CM

## 2022-06-08 DIAGNOSIS — I219 Acute myocardial infarction, unspecified: Secondary | ICD-10-CM | POA: Diagnosis not present

## 2022-06-08 LAB — CBC WITH DIFFERENTIAL/PLATELET
Abs Immature Granulocytes: 0.02 10*3/uL (ref 0.00–0.07)
Basophils Absolute: 0 10*3/uL (ref 0.0–0.1)
Basophils Relative: 1 %
Eosinophils Absolute: 0.1 10*3/uL (ref 0.0–0.5)
Eosinophils Relative: 1 %
HCT: 30.8 % — ABNORMAL LOW (ref 39.0–52.0)
Hemoglobin: 10.6 g/dL — ABNORMAL LOW (ref 13.0–17.0)
Immature Granulocytes: 0 %
Lymphocytes Relative: 19 %
Lymphs Abs: 1.1 10*3/uL (ref 0.7–4.0)
MCH: 34.8 pg — ABNORMAL HIGH (ref 26.0–34.0)
MCHC: 34.4 g/dL (ref 30.0–36.0)
MCV: 101 fL — ABNORMAL HIGH (ref 80.0–100.0)
Monocytes Absolute: 1 10*3/uL (ref 0.1–1.0)
Monocytes Relative: 18 %
Neutro Abs: 3.6 10*3/uL (ref 1.7–7.7)
Neutrophils Relative %: 61 %
Platelets: 71 10*3/uL — ABNORMAL LOW (ref 150–400)
RBC: 3.05 MIL/uL — ABNORMAL LOW (ref 4.22–5.81)
RDW: 15.2 % (ref 11.5–15.5)
Smear Review: DECREASED
WBC: 5.8 10*3/uL (ref 4.0–10.5)
nRBC: 0 % (ref 0.0–0.2)

## 2022-06-08 LAB — COMPREHENSIVE METABOLIC PANEL
ALT: 37 U/L (ref 0–44)
AST: 41 U/L (ref 15–41)
Albumin: 3.2 g/dL — ABNORMAL LOW (ref 3.5–5.0)
Alkaline Phosphatase: 263 U/L — ABNORMAL HIGH (ref 38–126)
Anion gap: 10 (ref 5–15)
BUN: 13 mg/dL (ref 8–23)
CO2: 22 mmol/L (ref 22–32)
Calcium: 8.9 mg/dL (ref 8.9–10.3)
Chloride: 99 mmol/L (ref 98–111)
Creatinine, Ser: 1.15 mg/dL (ref 0.61–1.24)
GFR, Estimated: >60 mL/min (ref >60)
Glucose, Bld: 146 mg/dL — ABNORMAL HIGH (ref 70–99)
Potassium: 3.9 mmol/L (ref 3.5–5.1)
Sodium: 131 mmol/L — ABNORMAL LOW (ref 135–145)
Total Bilirubin: 1.5 mg/dL — ABNORMAL HIGH (ref 0.3–1.2)
Total Protein: 6.5 g/dL (ref 6.5–8.1)

## 2022-06-08 LAB — PROTIME-INR
INR: 1 (ref 0.8–1.2)
Prothrombin Time: 13.5 seconds (ref 11.4–15.2)

## 2022-06-08 MED ORDER — ENOXAPARIN SODIUM 120 MG/0.8ML IJ SOSY
1.5000 mg/kg | PREFILLED_SYRINGE | Freq: Once | INTRAMUSCULAR | Status: AC
  Administered 2022-06-08: 105 mg via SUBCUTANEOUS
  Filled 2022-06-08: qty 0.8

## 2022-06-08 MED ORDER — PERFLUTREN LIPID MICROSPHERE: 1.0000 mL | INTRAVENOUS | Status: DC | PRN

## 2022-06-08 MED ORDER — PERFLUTREN LIPID MICROSPHERE: 1.0000 mL | INTRAVENOUS | Status: AC | PRN

## 2022-06-08 MED ORDER — ENOXAPARIN SODIUM 120 MG/0.8ML IJ SOSY: 1.5000 mg/kg | PREFILLED_SYRINGE | Freq: Once | INTRAMUSCULAR | Status: DC

## 2022-06-08 NOTE — ED Provider Notes (Signed)
[facility] EMERGENCY DEPT Provider Note   CSN: [id number] Arrival date & time: 06/08/22  1515     History  Chief Complaint  Patient presents with   Ventricular Thrombus    TAITEN BRAWN is a 66 y.o. male.  HPI   66 year old male with medical history significant for HTN, HLD, DM 2, obesity, claudication, cholangiocarcinoma, CHF who presents to the emergency department with concern for an LV thrombus.  The patient follows outpatient with Dr. Davina Poke of cardiology in [place].  He was sent to the [facility] lab for echocardiogram this afternoon during which time he was incidentally found to have an LV thrombus on echo.  He denies any chest pain or shortness of breath.  He denies any strokelike symptoms.  He is asymptomatic.  Home Medications Prior to Admission medications   Medication Sig Start Date End Date Taking? Authorizing Provider  acetaminophen (TYLENOL) 500 MG tablet Take 500 mg by mouth every 6 (six) hours as needed for moderate pain or headache.    [provider]  albuterol (VENTOLIN HFA) 108 (90 Base) MCG/ACT inhaler Inhale 2 puffs into the lungs every 6 (six) hours as needed for wheezing or shortness of breath. 11/29/21   Tat, Shanon Brow, MD  atorvastatin (LIPITOR) 40 MG tablet Take 1 tablet (40 mg total) by mouth daily at 6 PM. 12/05/21   O'Neal, Cassie Freer, MD  CISPLATIN IV Inject into the vein once a week. Days 1, 8 every 21 days 07/22/21   [provider]  Durvalumab (IMFINZI IV) Inject into the vein every 21 ( twenty-one) days. 07/22/21   [provider]  famotidine (PEPCID) 20 MG tablet Take 20 mg by mouth 2 (two) times daily.    [provider]  ferrous sulfate (FERROUSUL) 325 (65 FE) MG tablet Take 1 tablet (325 mg total) by mouth daily with breakfast. 03/18/21   Rehman, Mechele Dawley, MD  furosemide (LASIX) 20 MG tablet Take 20 mg daily as needed for swelling 11/05/21   Freada Bergeron, MD  Gemcitabine HCl  (GEMZAR IV) Inject into the vein once a week. Days 1, 8 every 21 days 07/22/21   [provider]  Ginseng 100 MG CAPS Take 1 capsule by mouth daily.    [provider]  loratadine (CLARITIN) 10 MG tablet Take 10 mg by mouth daily. Takes for a few days after shot to boost WBC. About every 2nd treatment    [provider]  metFORMIN (GLUCOPHAGE) 500 MG tablet Take 1 tablet (500 mg total) by mouth 2 (two) times daily with a meal. 05/29/21 01/13/22  Dessa Phi, DO  midodrine (PROAMATINE) 5 MG tablet Take 1 tablet (5 mg total) by mouth 3 (three) times daily with meals. 12/15/21   O'Neal, Cassie Freer, MD  nitroGLYCERIN (NITROSTAT) 0.4 MG SL tablet Place 1 tablet (0.4 mg total) under the tongue every 5 (five) minutes as needed for chest pain. 09/12/21 09/12/22  Ledora Bottcher, PA  pantoprazole (PROTONIX) 40 MG tablet Take 1 tablet (40 mg total) by mouth daily. 12/05/21   O'NealCassie Freer, MD  prochlorperazine (COMPAZINE) 10 MG tablet TAKE (1) TABLET BY MOUTH EVERY (6) HOURS AS NEEDED. 04/27/22   Dede Query T, PA-C  tamsulosin (FLOMAX) 0.4 MG CAPS capsule TAKE (2) CAPSULES BY MOUTH AT BEDTIME. 05/08/22   Derek Jack, MD      Allergies    Patient has no known allergies.    Review of Systems   Review of Systems  All  other systems reviewed and are negative.   Physical Exam Updated Vital Signs BP (!) 172/104   Pulse 91   Temp 98.2 F (36.8 C)   Resp 18   Ht '5\' 11"'$  (1.803 m)   Wt 69.4 kg   SpO2 98%   BMI 21.34 kg/m  Physical Exam Vitals and nursing note reviewed.  Constitutional:      General: He is not in acute distress. HENT:     Head: Normocephalic and atraumatic.  Eyes:     Conjunctiva/sclera: Conjunctivae normal.     Pupils: Pupils are equal, round, and reactive to light.  Cardiovascular:     Rate and Rhythm: Normal rate and regular rhythm.     Pulses: Normal pulses.  Pulmonary:     Effort: Pulmonary effort is normal. No respiratory  distress.  Abdominal:     General: There is no distension.     Tenderness: There is no guarding.  Musculoskeletal:        General: No deformity or signs of injury.     Cervical back: Neck supple.  Skin:    Findings: No lesion or rash.  Neurological:     General: No focal deficit present.     Mental Status: He is alert. Mental status is at baseline.     ED Results / Procedures / Treatments   Labs (all labs ordered are listed, but only abnormal results are displayed) Labs Reviewed  CBC WITH DIFFERENTIAL/PLATELET - Abnormal; Notable for the following components:      Result Value   RBC 3.05 (*)    Hemoglobin 10.6 (*)    HCT 30.8 (*)    MCV 101.0 (*)    MCH 34.8 (*)    Platelets 71 (*)    All other components within normal limits  COMPREHENSIVE METABOLIC PANEL - Abnormal; Notable for the following components:   Sodium 131 (*)    Glucose, Bld 146 (*)    Albumin 3.2 (*)    Alkaline Phosphatase 263 (*)    Total Bilirubin 1.5 (*)    All other components within normal limits  PROTIME-INR    EKG None  Radiology ECHOCARDIOGRAM COMPLETE  Result Date: 06/08/2022    ECHOCARDIOGRAM REPORT   Patient Name:   LAURA RADILLA Date of Exam: 06/08/2022 Medical Rec #:  [id number]       Height:       70.0 in Accession #:    [id number]      Weight:       157.4 lb Date of Birth:  [birth year]       BSA:          1.885 m Patient Age:    57 years        BP:           130/74 mmHg Patient Gender: M               HR:           99 bpm. Exam Location:  Outpatient Procedure: 2D Echo, Limited Echo, Intracardiac Opacification Agent, Cardiac            Doppler and Color Doppler Indications:     I51.3 (ICD-10-CM) - Left ventricular thrombus  History:         Patient has prior history of Echocardiogram examinations, most                  recent 11/27/2021. Previous Myocardial Infarction; Risk  Factors:Current Smoker, Dyslipidemia, Diabetes and                  Hypertension.  Sonographer:      Leavy Cella RDCS Referring Phys:  [place] Diagnosing Phys: Eleonore Chiquito MD IMPRESSIONS  1. A mass is present in the LV 1.6 cm x 1.2 cm. There is apical akinesis. This is likely an LV thrombus. Apical akinesis. Basal to mid inferior akinesis, and this segment appears aneurysmal. Findings consistent with ischemic cardiomyopathy. Patient instructed to go to the ER due to LV thrombus for initation of anticoagulation. Left ventricular ejection fraction, by estimation, is 45 to 50%. The left ventricle has mildly decreased function. The left ventricle demonstrates regional wall motion abnormalities (see scoring diagram/findings for description). Indeterminate diastolic filling due to E-A fusion.  2. Right ventricular systolic function is normal. The right ventricular size is normal. Tricuspid regurgitation signal is inadequate for assessing PA pressure.  3. The mitral valve is grossly normal. Trivial mitral valve regurgitation. No evidence of mitral stenosis.  4. The aortic valve was not well visualized. Aortic valve regurgitation is not visualized.  5. Aortic dilatation noted. There is mild dilatation of the aortic root, measuring 40 mm.  6. The inferior vena cava is normal in size with greater than 50% respiratory variability, suggesting right atrial pressure of 3 mmHg. FINDINGS  Left Ventricle: A mass is present in the LV 1.6 cm x 1.2 cm. There is apical akinesis. This is likely an LV thrombus. Apical akinesis. Basal to mid inferior akinesis, and this segment appears aneurysmal. Findings consistent with ischemic cardiomyopathy.  Patient instructed to go to the ER due to LV thrombus for initation of anticoagulation. Left ventricular ejection fraction, by estimation, is 45 to 50%. The left ventricle has mildly decreased function. The left ventricle demonstrates regional wall motion abnormalities. The left ventricular internal cavity size was normal in size. There is no left ventricular hypertrophy.  Indeterminate diastolic filling due to E-A fusion.  LV Wall Scoring: The inferior wall and apex are akinetic. Right Ventricle: The right ventricular size is normal. No increase in right ventricular wall thickness. Right ventricular systolic function is normal. Tricuspid regurgitation signal is inadequate for assessing PA pressure. Left Atrium: Left atrial size was normal in size. Right Atrium: Right atrial size was normal in size. Pericardium: There is no evidence of pericardial effusion. Mitral Valve: The mitral valve is grossly normal. Trivial mitral valve regurgitation. No evidence of mitral valve stenosis. Tricuspid Valve: The tricuspid valve is grossly normal. Tricuspid valve regurgitation is trivial. No evidence of tricuspid stenosis. Aortic Valve: The aortic valve was not well visualized. Aortic valve regurgitation is not visualized. Pulmonic Valve: The pulmonic valve was not well visualized. Pulmonic valve regurgitation is not visualized. Aorta: Aortic dilatation noted. There is mild dilatation of the aortic root, measuring 40 mm. Venous: The inferior vena cava is normal in size with greater than 50% respiratory variability, suggesting right atrial pressure of 3 mmHg. IAS/Shunts: The atrial septum is grossly normal.  LEFT VENTRICLE PLAX 2D LVIDd:         5.46 cm   Diastology LVIDs:         4.12 cm   LV e' medial:    9.36 cm/s LV PW:         0.92 cm   LV E/e' medial:  8.8 LV IVS:        0.95 cm   LV e' lateral:   9.57 cm/s LVOT diam:  2.10 cm   LV E/e' lateral: 8.7 LV SV:         39 LV SV Index:   21 LVOT Area:     3.46 cm                           3D Volume EF:                          3D EF:        30 %                          LV EDV:       168 ml                          LV ESV:       118 ml                          LV SV:        50 ml RIGHT VENTRICLE RV Basal diam:  2.79 cm RV Mid diam:    2.48 cm RV S prime:     14.70 cm/s TAPSE (M-mode): 2.6 cm LEFT ATRIUM             Index        RIGHT ATRIUM           Index LA diam:        3.60 cm 1.91 cm/m   RA Area:     7.78 cm LA Vol (A2C):   38.8 ml 20.58 ml/m  RA Volume:   14.70 ml 7.80 ml/m LA Vol (A4C):   41.9 ml 22.22 ml/m LA Biplane Vol: 40.5 ml 21.48 ml/m  AORTIC VALVE LVOT Vmax:   74.90 cm/s LVOT Vmean:  50.300 cm/s LVOT VTI:    0.114 m  AORTA Ao Root diam: 4.00 cm MITRAL VALVE MV Area (PHT): 5.75 cm    SHUNTS MV Decel Time: 132 msec    Systemic VTI:  0.11 m MV E velocity: 82.80 cm/s  Systemic Diam: 2.10 cm MV A velocity: 29.30 cm/s MV E/A ratio:  2.83 Eleonore Chiquito MD Electronically signed by Eleonore Chiquito MD Signature Date/Time: 06/08/2022/3:48:18 PM    Final (Updated)     Procedures Procedures    Medications Ordered in ED Medications  enoxaparin (LOVENOX) injection 105 mg (105 mg Subcutaneous Given 06/08/22 1755)    ED Course/ Medical Decision Making/ A&P                           Medical Decision Making Amount and/or Complexity of Data Reviewed Labs: ordered.  Risk Prescription drug management.     66 year old male with medical history significant for HTN, HLD, DM 2, obesity, claudication, cholangiocarcinoma, CHF who presents to the emergency department with concern for an LV thrombus.  The patient follows outpatient with Dr. Davina Poke of cardiology in [place].  He was sent to the [facility] lab for echocardiogram this afternoon during which time he was incidentally found to have an LV thrombus on echo.  He denies any chest pain or shortness of breath.  He denies any strokelike symptoms.  He is asymptomatic.  On arrival, the patient was vitally stable, hypertensive BP 172/103, otherwise generally unremarkable.  Physical exam unremarkable.  I did speak with Dr. Davina Poke of cardiology who is his cardiologist outpatient.  Plan will be for a single Lovenox injection here, check screening labs, follow-up outpatient tomorrow in clinic to initiate Coumadin.  Screening labs significant for a thrombocytopenia to 71, anemia to  10.6, elevated alkaline phosphatase 263 with an elevated T. bili to 1.5, INR 1.0.  Patient platelets are always thrombocytopenic.  Discussed plan for Lovenox injection with on-call pharmacy, will administer a 1.5 mg/kg Lovenox injection and have the patient follow-up tomorrow with Dr. Davina Poke for initiation of Coumadin.  Advised the patient that we will need laboratory testing outpatient for continued monitoring.  Patient advised of plan of care.  Return precautions provided for concern for bleeding symptoms.   Final Clinical Impression(s) / ED Diagnoses Final diagnoses:  Ventricular mural thrombus    Rx / DC Orders ED Discharge Orders     None         Regan Lemming, MD 06/08/22 1821

## 2022-06-08 NOTE — Discharge Instructions (Addendum)
Your echocardiogram showed a left ventricular thrombus.  We have given you a shot of Lovenox and you need to follow-up tomorrow for initiation of Coumadin. Call you cardiologist tomorrow morning. Your Lovenox injection will provide anticoagulation for 24 hours.

## 2022-06-08 NOTE — Progress Notes (Unsigned)
Notified by echo lab staff that [name] has an LV apical thrombus.  Images reviewed.  I do agree findings are likely consistent with an LV thrombus.  I have recommended evaluation in the emergency room for initiation of Lovenox and Coumadin.  It seems difficult to arrange this in the late afternoon.  He does have metastatic cholangiocarcinoma with an ischemic cardiomyopathy with limited options for aggressive care.  His anemia has been stable.  I think it would be okay for him to be evaluated with repeat lab work and then transition to Lovenox and Coumadin.  We will send him to the emergency room to expedite this.  [place] T. Audie Box, MD, [place]  [address], [place], [place] [address] [phone]  3:05 PM

## 2022-06-08 NOTE — ED Triage Notes (Signed)
Pt sent from cardiologist for a "blood clot in his heart". Pt needs IV thinners and admission.

## 2022-06-09 ENCOUNTER — Ambulatory Visit: Payer: Medicare Other | Attending: Internal Medicine | Admitting: *Deleted

## 2022-06-09 ENCOUNTER — Telehealth: Payer: Self-pay | Admitting: Cardiovascular Disease

## 2022-06-09 ENCOUNTER — Other Ambulatory Visit: Payer: Self-pay

## 2022-06-09 DIAGNOSIS — Z5181 Encounter for therapeutic drug level monitoring: Secondary | ICD-10-CM | POA: Insufficient documentation

## 2022-06-09 DIAGNOSIS — I513 Intracardiac thrombosis, not elsewhere classified: Secondary | ICD-10-CM | POA: Diagnosis not present

## 2022-06-09 LAB — ECHOCARDIOGRAM COMPLETE
Area-P 1/2: 5.75 cm2
S' Lateral: 4.12 cm

## 2022-06-09 LAB — POCT INR: INR: 1.1 — AB (ref 2.0–3.0)

## 2022-06-09 MED ORDER — PERFLUTREN LIPID MICROSPHERE: 1.0000 mL | INTRAVENOUS | Status: AC | PRN

## 2022-06-09 MED ORDER — ENOXAPARIN SODIUM 100 MG/ML IJ SOSY: 100.0000 mg | PREFILLED_SYRINGE | INTRAMUSCULAR | 0 refills | Status: DC

## 2022-06-09 MED ORDER — WARFARIN SODIUM 5 MG PO TABS: 5.0000 mg | ORAL_TABLET | Freq: Every day | ORAL | 3 refills | Status: DC

## 2022-06-09 NOTE — Telephone Encounter (Signed)
Pt's spouse calling in regards to a prescription for pt's Blood Thinner. Please advise

## 2022-06-09 NOTE — Telephone Encounter (Signed)
Contacted patient spoke with wife. Advised of plan from the hospital visit- she is aware coumadin clinic will contact them to help set them up for medication.   Also, advised of appointment for Friday AM- gave address for appointment.   Patient wife verbalized understanding.

## 2022-06-09 NOTE — Patient Instructions (Signed)
Start warfarin '5mg'$  (1 tablet) daily at 6pm Inject Lovenox '100mg'$  SQ daily at 6pm Recheck INR on 06/15/22

## 2022-06-10 ENCOUNTER — Inpatient Hospital Stay: Payer: Medicare Other | Attending: Hematology

## 2022-06-10 ENCOUNTER — Inpatient Hospital Stay: Payer: Medicare Other

## 2022-06-10 ENCOUNTER — Inpatient Hospital Stay (HOSPITAL_BASED_OUTPATIENT_CLINIC_OR_DEPARTMENT_OTHER): Payer: Medicare Other | Admitting: Hematology

## 2022-06-10 ENCOUNTER — Other Ambulatory Visit: Payer: Self-pay | Admitting: Pharmacist

## 2022-06-10 VITALS — BP 156/85 | HR 84 | Temp 98.0°F | Resp 18 | Ht 71.0 in | Wt 156.2 lb

## 2022-06-10 DIAGNOSIS — Z5112 Encounter for antineoplastic immunotherapy: Secondary | ICD-10-CM | POA: Insufficient documentation

## 2022-06-10 DIAGNOSIS — C221 Intrahepatic bile duct carcinoma: Secondary | ICD-10-CM | POA: Diagnosis not present

## 2022-06-10 DIAGNOSIS — Z7901 Long term (current) use of anticoagulants: Secondary | ICD-10-CM | POA: Insufficient documentation

## 2022-06-10 DIAGNOSIS — I700 Atherosclerosis of aorta: Secondary | ICD-10-CM | POA: Insufficient documentation

## 2022-06-10 DIAGNOSIS — Z5986 Financial insecurity: Secondary | ICD-10-CM | POA: Insufficient documentation

## 2022-06-10 DIAGNOSIS — R42 Dizziness and giddiness: Secondary | ICD-10-CM | POA: Diagnosis not present

## 2022-06-10 DIAGNOSIS — R634 Abnormal weight loss: Secondary | ICD-10-CM | POA: Insufficient documentation

## 2022-06-10 DIAGNOSIS — Z5111 Encounter for antineoplastic chemotherapy: Secondary | ICD-10-CM | POA: Diagnosis present

## 2022-06-10 DIAGNOSIS — Z95828 Presence of other vascular implants and grafts: Secondary | ICD-10-CM

## 2022-06-10 DIAGNOSIS — Z8249 Family history of ischemic heart disease and other diseases of the circulatory system: Secondary | ICD-10-CM | POA: Diagnosis not present

## 2022-06-10 DIAGNOSIS — E785 Hyperlipidemia, unspecified: Secondary | ICD-10-CM | POA: Diagnosis not present

## 2022-06-10 DIAGNOSIS — G479 Sleep disorder, unspecified: Secondary | ICD-10-CM | POA: Diagnosis not present

## 2022-06-10 DIAGNOSIS — F17210 Nicotine dependence, cigarettes, uncomplicated: Secondary | ICD-10-CM | POA: Diagnosis not present

## 2022-06-10 DIAGNOSIS — Z79899 Other long term (current) drug therapy: Secondary | ICD-10-CM | POA: Insufficient documentation

## 2022-06-10 DIAGNOSIS — R350 Frequency of micturition: Secondary | ICD-10-CM | POA: Diagnosis not present

## 2022-06-10 DIAGNOSIS — I10 Essential (primary) hypertension: Secondary | ICD-10-CM | POA: Insufficient documentation

## 2022-06-10 DIAGNOSIS — I2510 Atherosclerotic heart disease of native coronary artery without angina pectoris: Secondary | ICD-10-CM | POA: Insufficient documentation

## 2022-06-10 DIAGNOSIS — R059 Cough, unspecified: Secondary | ICD-10-CM | POA: Insufficient documentation

## 2022-06-10 LAB — COMPREHENSIVE METABOLIC PANEL
ALT: 40 U/L (ref 0–44)
AST: 49 U/L — ABNORMAL HIGH (ref 15–41)
Albumin: 2.9 g/dL — ABNORMAL LOW (ref 3.5–5.0)
Alkaline Phosphatase: 240 U/L — ABNORMAL HIGH (ref 38–126)
Anion gap: 7 (ref 5–15)
BUN: 13 mg/dL (ref 8–23)
CO2: 23 mmol/L (ref 22–32)
Calcium: 8.5 mg/dL — ABNORMAL LOW (ref 8.9–10.3)
Chloride: 102 mmol/L (ref 98–111)
Creatinine, Ser: 1.16 mg/dL (ref 0.61–1.24)
GFR, Estimated: >60 mL/min (ref >60)
Glucose, Bld: 186 mg/dL — ABNORMAL HIGH (ref 70–99)
Potassium: 3.9 mmol/L (ref 3.5–5.1)
Sodium: 132 mmol/L — ABNORMAL LOW (ref 135–145)
Total Bilirubin: 1.6 mg/dL — ABNORMAL HIGH (ref 0.3–1.2)
Total Protein: 6.7 g/dL (ref 6.5–8.1)

## 2022-06-10 LAB — CBC WITH DIFFERENTIAL/PLATELET
Abs Immature Granulocytes: 0.01 10*3/uL (ref 0.00–0.07)
Basophils Absolute: 0 10*3/uL (ref 0.0–0.1)
Basophils Relative: 1 %
Eosinophils Absolute: 0.1 10*3/uL (ref 0.0–0.5)
Eosinophils Relative: 1 %
HCT: 32.3 % — ABNORMAL LOW (ref 39.0–52.0)
Hemoglobin: 10.8 g/dL — ABNORMAL LOW (ref 13.0–17.0)
Immature Granulocytes: 0 %
Lymphocytes Relative: 22 %
Lymphs Abs: 0.9 10*3/uL (ref 0.7–4.0)
MCH: 35 pg — ABNORMAL HIGH (ref 26.0–34.0)
MCHC: 33.4 g/dL (ref 30.0–36.0)
MCV: 104.5 fL — ABNORMAL HIGH (ref 80.0–100.0)
Monocytes Absolute: 0.7 10*3/uL (ref 0.1–1.0)
Monocytes Relative: 16 %
Neutro Abs: 2.5 10*3/uL (ref 1.7–7.7)
Neutrophils Relative %: 60 %
Platelets: 90 10*3/uL — ABNORMAL LOW (ref 150–400)
RBC: 3.09 MIL/uL — ABNORMAL LOW (ref 4.22–5.81)
RDW: 15.4 % (ref 11.5–15.5)
WBC: 4.2 10*3/uL (ref 4.0–10.5)
nRBC: 0 % (ref 0.0–0.2)

## 2022-06-10 LAB — MAGNESIUM: Magnesium: 1.7 mg/dL (ref 1.7–2.4)

## 2022-06-10 MED ORDER — HEPARIN SOD (PORK) LOCK FLUSH 100 UNIT/ML IV SOLN
500.0000 [IU] | Freq: Once | INTRAVENOUS | Status: AC | PRN
  Administered 2022-06-10: 500 [IU]

## 2022-06-10 MED ORDER — SODIUM CHLORIDE 0.9% FLUSH
10.0000 mL | INTRAVENOUS | Status: DC | PRN
  Administered 2022-06-10: 10 mL

## 2022-06-10 MED ORDER — SODIUM CHLORIDE 0.9 % IV SOLN
1500.0000 mg | Freq: Once | INTRAVENOUS | Status: AC
  Administered 2022-06-10: 1500 mg via INTRAVENOUS
  Filled 2022-06-10: qty 30

## 2022-06-10 MED ORDER — SODIUM CHLORIDE 0.9 % IV SOLN: Freq: Once | INTRAVENOUS | Status: AC

## 2022-06-10 NOTE — Progress Notes (Signed)
[address] [place], [place] [address]   CLINIC:  Medical Oncology/Hematology  PCP:  [facility], [place] / [place] [address] [phone]   REASON FOR VISIT:  Follow-up for intrahepatic cholangiocarcinoma  PRIOR THERAPY: none  NGS Results: not done  CURRENT THERAPY: Cisplatin + Gemcitabine D1, D15 q 28d  BRIEF ONCOLOGIC HISTORY:  Oncology History  Cholangiocarcinoma ([facility])  02/28/2021 Initial Diagnosis   Cholangiocarcinoma ([facility])   07/17/2021 Cancer Staging   Staging form: Perihilar Bile Ducts, AJCC 8th Edition - Clinical stage from 07/17/2021: Stage IVB (cTX, cNX, pM1) - Signed by Derek Jack, MD on 07/17/2021 Stage prefix: Initial diagnosis   07/22/2021 - 04/01/2022 Chemotherapy   Patient is on Treatment Plan : BILIARY TRACT Cisplatin + Gemcitabine D1,15 + Imfinzi day 1 q28d     07/22/2021 -  Chemotherapy   Patient is on Treatment Plan : BILIARY TRACT Cisplatin + Gemcitabine D1,8 q21d       CANCER STAGING:  Cancer Staging  Cholangiocarcinoma ([facility]) Staging form: Perihilar Bile Ducts, AJCC 8th Edition - Clinical stage from 07/17/2021: Stage IVB (cTX, cNX, pM1) - Signed by Derek Jack, MD on 07/17/2021   INTERVAL HISTORY:  [name], a 66 y.o. male, seen for follow-up of cholangiocarcinoma and toxicity assessment prior to next cycle of therapy.  Unfortunately he has developed left ventricular thrombus and is currently on anticoagulation.  He has a decreased energy levels.  Occasional cough has been stable.  His wife reports that he developed some rash with itching on the back and she has applied Vaseline.  Now it is dried and hyperpigmented area in the mid lower back.  REVIEW OF SYSTEMS:  Review of Systems  Gastrointestinal:  Negative for diarrhea.  Skin:  Negative for rash.  Neurological:  Positive for dizziness. Negative for numbness.  Psychiatric/Behavioral:  The patient is  nervous/anxious.   All other systems reviewed and are negative.   PAST MEDICAL/SURGICAL HISTORY:  Past Medical History:  Diagnosis Date   Cancer ([facility])    CHF (congestive heart failure) (H[facility])    Chronic pain    neck, back, knees   Class 1 obesity due to excess calories with body mass index (BMI) of 31.0 to 31.9 in adult    Claudication [facility])    Coronary artery disease    DDD (degenerative disc disease), cervical    Diabetes mellitus ([facility])    Dyslipidemia    Hypertension    Port-A-Cath in place 07/20/2021   Tobacco dependence    Past Surgical History:  Procedure Laterality Date   BILIARY BRUSHING N/A 02/25/2021   Procedure: BILIARY BRUSHING;  Surgeon: Rogene Houston, MD;  Location: [facility];  Service: Gastroenterology;  Laterality: N/A;   BILIARY STENT PLACEMENT N/A 02/25/2021   Procedure: BILIARY STENT PLACEMENT;  Surgeon: Rogene Houston, MD;  Location: [facility];  Service: Gastroenterology;  Laterality: N/A;   BILIARY STENT PLACEMENT  02/27/2021   Procedure: BILIARY STENT PLACEMENT (10FR x 9cm) IN THE RIGHT SYSTEM;  Surgeon: Rogene Houston, MD;  Location: [facility];  Service: Gastroenterology;;   BREAST SURGERY Left    benign lump- in his 63s.   CARDIAC CATHETERIZATION     ERCP N/A 02/25/2021   Procedure: ENDOSCOPIC RETROGRADE CHOLANGIOPANCREATOGRAPHY (ERCP);  Surgeon: Rogene Houston, MD;  Location: [facility];  Service: Gastroenterology;  Laterality: N/A;   ERCP N/A 02/27/2021   Procedure: ENDOSCOPIC RETROGRADE CHOLANGIOPANCREATOGRAPHY (ERCP);  Surgeon: Rogene Houston, MD;  Location:  [facility];  Service: Gastroenterology;  Laterality: N/A;   IR IMAGING GUIDED PORT INSERTION  07/21/2021   KNEE SURGERY Left    x2   LEFT HEART CATH AND CORONARY ANGIOGRAPHY N/A 09/08/2021   Procedure: LEFT HEART CATH AND CORONARY ANGIOGRAPHY;  Surgeon: Early Osmond, MD;  Location: [place];  Service: Cardiovascular;  Laterality: N/A;   SPHINCTEROTOMY N/A 02/25/2021   Procedure:  SPHINCTEROTOMY;  Surgeon: Rogene Houston, MD;  Location: [facility];  Service: Gastroenterology;  Laterality: N/A;   STENT REMOVAL  02/27/2021   Procedure: STENT REMOVAL (8.5Fr x 9cm);  Surgeon: Rogene Houston, MD;  Location: [facility];  Service: Gastroenterology;;    SOCIAL HISTORY:  Social History   Socioeconomic History   Marital status: Married    Spouse name: Not on file   Number of children: Not on file   Years of education: Not on file   Highest education level: Not on file  Occupational History   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  Tobacco Use   Smoking status: Every Day    Packs/day: 0.50    Years: 56.00    Total pack years: 28.00    Types: Cigarettes   Smokeless tobacco: Never  Vaping Use   Vaping Use: Never used  Substance and Sexual Activity   Alcohol use: Not Currently    Comment: stopped 2.5 years ago 03/11/21   Drug use: Never   Sexual activity: Not on file  Other Topics Concern   Not on file  Social History Narrative   Not on file   Social Determinants of Health   Financial Resource Strain: Medium Risk (03/07/2021)   Overall Financial Resource Strain (CARDIA)    Difficulty of Paying Living Expenses: Somewhat hard  Food Insecurity: No Food Insecurity (03/07/2021)   Hunger Vital Sign    Worried About Running Out of Food in the Last Year: Never true    [place] in the Last Year: Never true  Transportation Needs: No Transportation Needs (03/07/2021)   PRAPARE - Hydrologist (Medical): No    Lack of Transportation (Non-Medical): No  Physical Activity: Insufficiently Active (03/07/2021)   Exercise Vital Sign    Days of Exercise per Week: 2 days    Minutes of Exercise per Session: 20 min  Stress: No Stress Concern Present (03/07/2021)   [place]    Feeling of Stress : Only a little  Social Connections: Moderately Integrated (03/07/2021)   Social Connection and  Isolation Panel [NHANES]    Frequency of Communication with Friends and Family: More than three times a week    Frequency of Social Gatherings with Friends and Family: More than three times a week    Attends Religious Services: More than 4 times per year    Active Member of [employer] or Organizations: No    Attends Archivist Meetings: Never    Marital Status: Married  Human resources officer Violence: Not At Risk (03/07/2021)   Humiliation, Afraid, Rape, and Kick questionnaire    Fear of Current or Ex-Partner: No    Emotionally Abused: No    Physically Abused: No    Sexually Abused: No    FAMILY HISTORY:  Family History  Problem Relation Age of Onset   CAD Mother    Cancer Neg Hx     CURRENT MEDICATIONS:  Current Outpatient Medications  Medication Sig Dispense Refill   acetaminophen (TYLENOL) 500 MG  tablet Take 500 mg by mouth every 6 (six) hours as needed for moderate pain or headache.     albuterol (VENTOLIN HFA) 108 (90 Base) MCG/ACT inhaler Inhale 2 puffs into the lungs every 6 (six) hours as needed for wheezing or shortness of breath. 6.7 g 1   atorvastatin (LIPITOR) 40 MG tablet Take 1 tablet (40 mg total) by mouth daily at 6 PM. 90 tablet 3   CISPLATIN IV Inject into the vein once a week. Days 1, 8 every 21 days     Durvalumab (IMFINZI IV) Inject into the vein every 21 ( twenty-one) days.     enoxaparin (LOVENOX) 100 MG/ML injection Inject 1 mL (100 mg total) into the skin daily for 10 days. 10 mL 0   famotidine (PEPCID) 20 MG tablet Take 20 mg by mouth 2 (two) times daily.     ferrous sulfate (FERROUSUL) 325 (65 FE) MG tablet Take 1 tablet (325 mg total) by mouth daily with breakfast.     Gemcitabine HCl (GEMZAR IV) Inject into the vein once a week. Days 1, 8 every 21 days     Ginseng 100 MG CAPS Take 1 capsule by mouth daily.     loratadine (CLARITIN) 10 MG tablet Take 10 mg by mouth daily. Takes for a few days after shot to boost WBC. About every 2nd treatment      metFORMIN (GLUCOPHAGE) 500 MG tablet Take 1 tablet (500 mg total) by mouth 2 (two) times daily with a meal. 60 tablet 0   midodrine (PROAMATINE) 5 MG tablet Take 1 tablet (5 mg total) by mouth 3 (three) times daily with meals. 270 tablet 3   nitroGLYCERIN (NITROSTAT) 0.4 MG SL tablet Place 1 tablet (0.4 mg total) under the tongue every 5 (five) minutes as needed for chest pain. 25 tablet 3   pantoprazole (PROTONIX) 40 MG tablet Take 1 tablet (40 mg total) by mouth daily. 90 tablet 3   prochlorperazine (COMPAZINE) 10 MG tablet TAKE (1) TABLET BY MOUTH EVERY (6) HOURS AS NEEDED. 90 tablet 3   tamsulosin (FLOMAX) 0.4 MG CAPS capsule TAKE (2) CAPSULES BY MOUTH AT BEDTIME. 60 capsule 6   warfarin (COUMADIN) 5 MG tablet Take 1 tablet (5 mg total) by mouth daily. 45 tablet 3   No current facility-administered medications for this visit.    ALLERGIES:  No Known Allergies  PHYSICAL EXAM:  Performance status (ECOG): 0 - Asymptomatic  There were no vitals filed for this visit. Wt Readings from Last 3 Encounters:  06/10/22 156 lb 3.2 oz (70.9 kg)  06/10/22 156 lb 3.2 oz (70.9 kg)  06/08/22 153 lb (69.4 kg)   Physical Exam Vitals reviewed.  Constitutional:      Appearance: Normal appearance.  Cardiovascular:     Rate and Rhythm: Normal rate and regular rhythm.     Pulses: Normal pulses.     Heart sounds: Normal heart sounds.  Pulmonary:     Effort: Pulmonary effort is normal.     Breath sounds: Normal breath sounds.  Abdominal:     Palpations: Abdomen is soft. There is no mass.     Tenderness: There is no abdominal tenderness.  Musculoskeletal:     Right lower leg: No edema.     Left lower leg: No edema.  Neurological:     General: No focal deficit present.     Mental Status: He is alert and oriented to person, place, and time.  Psychiatric:  Mood and Affect: Mood normal.        Behavior: Behavior normal.    LABORATORY DATA:  I have reviewed the labs as listed.     Latest  Ref Rng & Units 06/08/2022    3:51 PM 05/27/2022    8:07 AM 05/13/2022    7:57 AM  CBC  WBC 4.0 - 10.5 K/uL 5.8  4.6  4.7   Hemoglobin 13.0 - 17.0 g/dL 10.6  10.1  9.9   Hematocrit 39.0 - 52.0 % 30.8  30.1  29.8   Platelets 150 - 400 K/uL 71  75  97       Latest Ref Rng & Units 06/08/2022    3:51 PM 05/27/2022    8:07 AM 05/13/2022    7:57 AM  CMP  Glucose 70 - 99 mg/dL 146  161  203   BUN 8 - 23 mg/dL _0 Creatinine 0.61 - 1.24 mg/dL 1.15  1.17  1.08   Sodium 135 - 145 mmol/L 131  131  132   Potassium 3.5 - 5.1 mmol/L 3.9  4.1  4.2   Chloride 98 - 111 mmol/L 99  99  102   CO2 22 - 32 mmol/L _1 Calcium 8.9 - 10.3 mg/dL 8.9  8.6  8.6   Total Protein 6.5 - 8.1 g/dL 6.5  6.3  6.5   Total Bilirubin 0.3 - 1.2 mg/dL 1.5  1.3  1.4   Alkaline Phos 38 - 126 U/L 263  236  219   AST 15 - 41 U/L 41  54  49   ALT 0 - 44 U/L 37  41  33     DIAGNOSTIC IMAGING:  I have independently reviewed the scans and discussed with the patient. ECHOCARDIOGRAM COMPLETE  Result Date: 06/09/2022    ECHOCARDIOGRAM REPORT   Patient Name:   Jonathan Keith Date of Exam: 06/08/2022 Medical Rec #:  [id number]       Height:       70.0 in Accession #:    [id number]      Weight:       157.4 lb Date of Birth:  07-09-1956       BSA:          1.885 m Patient Age:    83 years        BP:           130/74 mmHg Patient Gender: M               HR:           99 bpm. Exam Location:  Outpatient Procedure: 2D Echo, Intracardiac Opacification Agent, Cardiac Doppler and Color            Doppler Indications:     I51.3 (ICD-10-CM) - Left ventricular thrombus  History:         Patient has prior history of Echocardiogram examinations, most                  recent 11/27/2021. Previous Myocardial Infarction; Risk                  Factors:Current Smoker, Dyslipidemia, Diabetes and                  Hypertension.  Sonographer:     Leavy Cella RDCS Referring Phys:  [place] Diagnosing Phys: Eleonore Chiquito  MD IMPRESSIONS  1. A mass  is present in the LV 1.6 cm x 1.2 cm. There is apical akinesis. This is likely an LV thrombus. Apical akinesis. Basal to mid inferior akinesis, and this segment appears aneurysmal. Findings consistent with ischemic cardiomyopathy. Patient instructed to go to the ER due to LV thrombus for initation of anticoagulation. Left ventricular ejection fraction, by estimation, is 45 to 50%. The left ventricle has mildly decreased function. The left ventricle demonstrates regional wall motion abnormalities (see scoring diagram/findings for description). Indeterminate diastolic filling due to E-A fusion.  2. Right ventricular systolic function is normal. The right ventricular size is normal. Tricuspid regurgitation signal is inadequate for assessing PA pressure.  3. The mitral valve is grossly normal. Trivial mitral valve regurgitation. No evidence of mitral stenosis.  4. The aortic valve was not well visualized. Aortic valve regurgitation is not visualized.  5. Aortic dilatation noted. There is mild dilatation of the aortic root, measuring 40 mm.  6. The inferior vena cava is normal in size with greater than 50% respiratory variability, suggesting right atrial pressure of 3 mmHg. FINDINGS  Left Ventricle: A mass is present in the LV 1.6 cm x 1.2 cm. There is apical akinesis. This is likely an LV thrombus. Apical akinesis. Basal to mid inferior akinesis, and this segment appears aneurysmal. Findings consistent with ischemic cardiomyopathy.  Patient instructed to go to the ER due to LV thrombus for initation of anticoagulation. Left ventricular ejection fraction, by estimation, is 45 to 50%. The left ventricle has mildly decreased function. The left ventricle demonstrates regional wall motion abnormalities. The left ventricular internal cavity size was normal in size. There is no left ventricular hypertrophy. Indeterminate diastolic filling due to E-A fusion.  LV Wall Scoring: The inferior wall and apex  are akinetic. Right Ventricle: The right ventricular size is normal. No increase in right ventricular wall thickness. Right ventricular systolic function is normal. Tricuspid regurgitation signal is inadequate for assessing PA pressure. Left Atrium: Left atrial size was normal in size. Right Atrium: Right atrial size was normal in size. Pericardium: There is no evidence of pericardial effusion. Mitral Valve: The mitral valve is grossly normal. Trivial mitral valve regurgitation. No evidence of mitral valve stenosis. Tricuspid Valve: The tricuspid valve is grossly normal. Tricuspid valve regurgitation is trivial. No evidence of tricuspid stenosis. Aortic Valve: The aortic valve was not well visualized. Aortic valve regurgitation is not visualized. Pulmonic Valve: The pulmonic valve was not well visualized. Pulmonic valve regurgitation is not visualized. Aorta: Aortic dilatation noted. There is mild dilatation of the aortic root, measuring 40 mm. Venous: The inferior vena cava is normal in size with greater than 50% respiratory variability, suggesting right atrial pressure of 3 mmHg. IAS/Shunts: The atrial septum is grossly normal.  LEFT VENTRICLE PLAX 2D LVIDd:         5.46 cm   Diastology LVIDs:         4.12 cm   LV e' medial:    9.36 cm/s LV PW:         0.92 cm   LV E/e' medial:  8.8 LV IVS:        0.95 cm   LV e' lateral:   9.57 cm/s LVOT diam:     2.10 cm   LV E/e' lateral: 8.7 LV SV:         39 LV SV Index:   21 LVOT Area:     3.46 cm  3D Volume EF:                          3D EF:        30 %                          LV EDV:       168 ml                          LV ESV:       118 ml                          LV SV:        50 ml RIGHT VENTRICLE RV Basal diam:  2.79 cm RV Mid diam:    2.48 cm RV S prime:     14.70 cm/s TAPSE (M-mode): 2.6 cm LEFT ATRIUM             Index        RIGHT ATRIUM          Index LA diam:        3.60 cm 1.91 cm/m   RA Area:     7.78 cm LA Vol (A2C):   38.8 ml  20.58 ml/m  RA Volume:   14.70 ml 7.80 ml/m LA Vol (A4C):   41.9 ml 22.22 ml/m LA Biplane Vol: 40.5 ml 21.48 ml/m  AORTIC VALVE LVOT Vmax:   74.90 cm/s LVOT Vmean:  50.300 cm/s LVOT VTI:    0.114 m  AORTA Ao Root diam: 4.00 cm MITRAL VALVE MV Area (PHT): 5.75 cm    SHUNTS MV Decel Time: 132 msec    Systemic VTI:  0.11 m MV E velocity: 82.80 cm/s  Systemic Diam: 2.10 cm MV A velocity: 29.30 cm/s MV E/A ratio:  2.83 Eleonore Chiquito MD Electronically signed by Eleonore Chiquito MD Signature Date/Time: 06/08/2022/3:48:18 PM    Final (Updated)    CT CHEST ABDOMEN PELVIS W CONTRAST  Result Date: 06/06/2022 CLINICAL DATA:  Intrahepatic cholangiocarcinoma, ongoing chemotherapy * Tracking Code: BO * EXAM: CT CHEST, ABDOMEN, AND PELVIS WITH CONTRAST TECHNIQUE: Multidetector CT imaging of the chest, abdomen and pelvis was performed following the standard protocol during bolus administration of intravenous contrast. RADIATION DOSE REDUCTION: This exam was performed according to the departmental dose-optimization program which includes automated exposure control, adjustment of the mA and/or kV according to patient size and/or use of iterative reconstruction technique. CONTRAST:  144m OMNIPAQUE IOHEXOL 300 MG/ML SOLN additional oral enteric contrast COMPARISON:  03/16/2022 FINDINGS: CT CHEST FINDINGS Cardiovascular: Right chest port catheter. Aortic atherosclerosis. Normal heart size. New hypodensity within the left ventricular apex measuring 1.5 x 1.4 cm (series 2, image 49). Three-vessel coronary artery calcifications. No pericardial effusion. Mediastinum/Nodes: No enlarged mediastinal, hilar, or axillary lymph nodes. Thyroid gland, trachea, and esophagus demonstrate no significant findings. Lungs/Pleura: Diffuse bilateral bronchial wall thickening. Background of very fine centrilobular nodularity throughout, concentrated in the lung apices. Mild, bland appearing scarring of the bilateral lung bases, with unchanged, mild  elevation of the left hemidiaphragm. Occasional small pulmonary nodules are stable and benign, for example a 0.4 cm nodule in the posterior superior segment right lower lobe (series 3, image 98). No pleural effusion or pneumothorax. Musculoskeletal: No chest wall abnormality. No acute osseous findings. CT ABDOMEN PELVIS FINDINGS Hepatobiliary: Unchanged appearance of the  liver, with dense coils and cement in the right lobe of the liver, dense metallic streak artifact from which obscures evaluation of the liver parenchyma. Unchanged appearance of segmental biliary ductal dilatation throughout the liver with a common bile duct stent. No gallstones, gallbladder wall thickening, or biliary dilatation. Pancreas: Unremarkable. No pancreatic ductal dilatation or surrounding inflammatory changes. Spleen: Normal in size without significant abnormality. Adrenals/Urinary Tract: Adrenal glands are unremarkable. Kidneys are normal, without renal calculi, solid lesion, or hydronephrosis. Unchanged calculus in the posterior right bladder measuring 0.8 cm (series 2, image 113). Stomach/Bowel: Stomach is within normal limits. Appendix is not clearly visualized. No evidence of bowel wall thickening, distention, or inflammatory changes. Sigmoid diverticula. Vascular/Lymphatic: Aortic atherosclerosis. No enlarged abdominal or pelvic lymph nodes. Reproductive: No mass or other abnormality. Other: No abdominal wall hernia or abnormality. Unchanged small volume perihepatic ascites. Musculoskeletal: No acute osseous findings. IMPRESSION: 1. Unchanged appearance of the liver, with dense metallic streak artifact from coil material which obscures evaluation of the liver parenchyma. Unchanged appearance of segmental biliary ductal dilatation throughout the liver with common bile duct stent in place. 2. No evidence of lymphadenopathy or metastatic disease in the chest, abdomen, or pelvis. 3. Unchanged small volume perihepatic ascites. 4. New  hypodensity within the left ventricular apex measuring 1.5 x 1.4 cm. This is consistent left ventricular thrombus. Recommend echocardiographic evaluation. 5. Diffuse bilateral bronchial wall thickening with a background of very fine centrilobular nodularity throughout, concentrated in the lung apices. Findings are consistent with smoking-related respiratory bronchiolitis. 6. Unchanged urinary bladder calculus. 7. Coronary artery disease. These results will be called to the ordering clinician or representative by the Radiologist Assistant, and communication documented in the PACS or [employer]. Aortic Atherosclerosis (ICD10-I70.0). Electronically Signed   By: Delanna Ahmadi M.D.   On: 06/06/2022 15:45     ASSESSMENT:  Metastatic cholangiocarcinoma: - 02/2021 initial diagnosis presentation with painless jaundice - ERCP with sphincterotomy, subsequent ERCP with plastic biliary stent placement. - MRI-2 cm mass involving the common hepatic duct with upstream biliary dilatation. - 03/2021 consult at [facility] with GI and surgery-confirm diagnosis of perihilar cholangiocarcinoma - 03/21/2021-biopsy/pathology, ERCP-common hepatic duct cytology and brushings performed.  EUS-1 enlarged lymph node versus tumor implant in the gastrohepatic ligament-negative for malignancy.  Single biliary plastic stent removed and replaced with the plastic stent in the right and left ducts. - 04/17/2021-right portal vein embolization to promote left hepatic hypertrophy. - 07/02/2021-diagnostic laparoscopy with multiple areas concerning for metastatic blocks identified in the abdomen and peritoneal surfaces.  6 separate biopsies obtained. - Pathology consistent with metastatic carcinoma. - 37 pound weight loss in the last 5 months. - Cycle 1 of gemcitabine, cisplatin, durvalumab on 07/22/2021.  2.  Social history/family: - Lives at home with his wife.  He worked as a Development worker, community. - Current active smoker, 1 pack/day  since age 28. - Maternal grandmother had cancer.  Maternal uncle had brain tumor.   PLAN:  Metastatic cholangiocarcinoma: -CT CAP (06/04/2022): Unchanged appearance of liver lesions with no evidence of lymphadenopathy or metastatic disease in the chest, abdomen or pelvis.  Unchanged small volume perihepatic ascites. - Labs today shows elevated AST and alk phos.  Mildly elevated bilirubin.  CBC shows platelet count 90.  White count is normal. - Unfortunately developed left ventricular thrombus.  He was started on Coumadin. - I would hold off on chemotherapy today.  He will proceed with Imfinzi.  I will reevaluate him in 4 weeks.  2.  Weight loss: -  He is eating well.  Weight is stable.  3.  Left ventricular thrombus: - CT CAP showed incidental finding. - 2D echo on 06/08/2022 showed 1.6 x 1.2 cm left ventricular thrombus with apical akinesis. - He is on Lovenox and Coumadin.  4.  Hypotension: - Continue midodrine 5 mg 3 times daily.   Orders placed this encounter:  No orders of the defined types were placed in this encounter.     Derek Jack, MD [place] [phone]

## 2022-06-10 NOTE — Patient Instructions (Signed)
[facility]  Discharge Instructions: Thank you for choosing [facility] to provide your oncology and hematology care.  If you have a lab appointment with the Cancer Center, please come in thru the Main Entrance and check in at the main information desk.  Wear comfortable clothing and clothing appropriate for easy access to any Portacath or PICC line.   We strive to give you quality time with your provider. You may need to reschedule your appointment if you arrive late (15 or more minutes).  Arriving late affects you and other patients whose appointments are after yours.  Also, if you miss three or more appointments without notifying the office, you may be dismissed from the clinic at the provider's discretion.      For prescription refill requests, have your pharmacy contact our office and allow 72 hours for refills to be completed.    Today you received the following chemotherapy and/or immunotherapy agents Imfinzi. Durvalumab Injection What is this medication? DURVALUMAB (dur VAL ue mab) treats some types of cancer. It works by helping your immune system slow or stop the spread of cancer cells. It is a monoclonal antibody. This medicine may be used for other purposes; ask your health care provider or pharmacist if you have questions. COMMON BRAND NAME(S): IMFINZI What should I tell my care team before I take this medication? They need to know if you have any of these conditions: Allogeneic stem cell transplant (uses someone else's stem cells) Autoimmune diseases, such as Crohn disease, ulcerative colitis, lupus History of chest radiation Nervous system problems, such as Guillain-Barre syndrome, myasthenia gravis Organ transplant An unusual or allergic reaction to durvalumab, other medications, foods, dyes, or preservatives Pregnant or trying to get pregnant Breast-feeding How should I use this medication? This medication is infused into a vein. It is  given by your care team in a hospital or clinic setting. A special MedGuide will be given to you before each treatment. Be sure to read this information carefully each time. Talk to your care team about the use of this medication in children. Special care may be needed. Overdosage: If you think you have taken too much of this medicine contact a poison control center or emergency room at once. NOTE: This medicine is only for you. Do not share this medicine with others. What if I miss a dose? Keep appointments for follow-up doses. It is important not to miss your dose. Call your care team if you are unable to keep an appointment. What may interact with this medication? Interactions have not been studied. This list may not describe all possible interactions. Give your health care provider a list of all the medicines, herbs, non-prescription drugs, or dietary supplements you use. Also tell them if you smoke, drink alcohol, or use illegal drugs. Some items may interact with your medicine. What should I watch for while using this medication? Your condition will be monitored carefully while you are receiving this medication. You may need blood work while taking this medication. This medication may cause serious skin reactions. They can happen weeks to months after starting the medication. Contact your care team right away if you notice fevers or flu-like symptoms with a rash. The rash may be red or purple and then turn into blisters or peeling of the skin. You may also notice a red rash with swelling of the face, lips, or lymph nodes in your neck or under your arms. Tell your care team right away if   you have any change in your eyesight. Talk to your care team if you may be pregnant. Serious birth defects can occur if you take this medication during pregnancy and for 3 months after the last dose. You will need a negative pregnancy test before starting this medication. Contraception is recommended while taking  this medication and for 3 months after the last dose. Your care team can help you find the option that works for you. Do not breastfeed while taking this medication and for 3 months after the last dose. What side effects may I notice from receiving this medication? Side effects that you should report to your care team as soon as possible: Allergic reactions--skin rash, itching, hives, swelling of the face, lips, tongue, or throat Dry cough, shortness of breath or trouble breathing Eye pain, redness, irritation, or discharge with blurry or decreased vision Heart muscle inflammation--unusual weakness or fatigue, shortness of breath, chest pain, fast or irregular heartbeat, dizziness, swelling of the ankles, feet, or hands Hormone gland problems--headache, sensitivity to light, unusual weakness or fatigue, dizziness, fast or irregular heartbeat, increased sensitivity to cold or heat, excessive sweating, constipation, hair loss, increased thirst or amount of urine, tremors or shaking, irritability Infusion reactions--chest pain, shortness of breath or trouble breathing, feeling faint or lightheaded Kidney injury (glomerulonephritis)--decrease in the amount of urine, red or dark Jonathan Keith urine, foamy or bubbly urine, swelling of the ankles, hands, or feet Liver injury--right upper belly pain, loss of appetite, nausea, light-colored stool, dark yellow or Jonathan Keith urine, yellowing skin or eyes, unusual weakness or fatigue Pain, tingling, or numbness in the hands or feet, muscle weakness, change in vision, confusion or trouble speaking, loss of balance or coordination, trouble walking, seizures Rash, fever, and swollen lymph nodes Redness, blistering, peeling, or loosening of the skin, including inside the mouth Sudden or severe stomach pain, bloody diarrhea, fever, nausea, vomiting Side effects that usually do not require medical attention (report these to your care team if they continue or are  bothersome): Bone, joint, or muscle pain Diarrhea Fatigue Loss of appetite Nausea Skin rash This list may not describe all possible side effects. Call your doctor for medical advice about side effects. You may report side effects to FDA at 1-800-FDA-1088. Where should I keep my medication? This medication is given in a hospital or clinic. It will not be stored at home. NOTE: This sheet is a summary. It may not cover all possible information. If you have questions about this medicine, talk to your doctor, pharmacist, or health care provider.  2023 Elsevier/Gold Standard (2021-11-17 00:00:00)       To help prevent nausea and vomiting after your treatment, we encourage you to take your nausea medication as directed.  BELOW ARE SYMPTOMS THAT SHOULD BE REPORTED IMMEDIATELY: *FEVER GREATER THAN 100.4 F (38 C) OR HIGHER *CHILLS OR SWEATING *NAUSEA AND VOMITING THAT IS NOT CONTROLLED WITH YOUR NAUSEA MEDICATION *UNUSUAL SHORTNESS OF BREATH *UNUSUAL BRUISING OR BLEEDING *URINARY PROBLEMS (pain or burning when urinating, or frequent urination) *BOWEL PROBLEMS (unusual diarrhea, constipation, pain near the anus) TENDERNESS IN MOUTH AND THROAT WITH OR WITHOUT PRESENCE OF ULCERS (sore throat, sores in mouth, or a toothache) UNUSUAL RASH, SWELLING OR PAIN  UNUSUAL VAGINAL DISCHARGE OR ITCHING   Items with * indicate a potential emergency and should be followed up as soon as possible or go to the Emergency Department if any problems should occur.  Please show the CHEMOTHERAPY ALERT CARD or IMMUNOTHERAPY ALERT CARD at check-in to the Emergency   Department and triage nurse.  Should you have questions after your visit or need to cancel or reschedule your appointment, please contact [facility] [phone]  and follow the prompts.  Office hours are 8:00 a.m. to 4:30 p.m. Monday - Friday. Please note that voicemails left after 4:00 p.m. may not be returned until the following  business day.  We are closed weekends and major holidays. You have access to a nurse at all times for urgent questions. Please call the main number to the clinic [phone] and follow the prompts.  For any non-urgent questions, you may also contact your provider using MyChart. We now offer e-Visits for anyone 18 and older to request care online for non-urgent symptoms. For details visit [email or web address].   Also download the MyChart app! Go to the app store, search "MyChart", open the app, select [facility], and log in with your MyChart username and password.  Masks are optional in the cancer centers. If you would like for your care team to wear a mask while they are taking care of you, please let them know. You may have one support person who is at least 66 years old accompany you for your appointments.  

## 2022-06-10 NOTE — Patient Instructions (Addendum)
[place] at [facility] Discharge Instructions   You were seen and examined today by Dr. Delton Coombes.  He reviewed your kidney and liver numbers, your electrolytes, and your blood sugar which are normal/stable. Your blood count results are pending.  We will hold your treatment today due to the recent diagnosis of the blood clot in your heart.   With the exception of the blood clot in your heart, your CT scan was stable as far as the cancer is concerned.   You may use hydrocortisone cream to help with the rash on your back.   Return as scheduled    Thank you for choosing [place] at [facility] to provide your oncology and hematology care.  To afford each patient quality time with our provider, please arrive at least 15 minutes before your scheduled appointment time.   If you have a lab appointment with the [facility] please come in thru the Main Entrance and check in at the main information desk.  You need to re-schedule your appointment should you arrive 10 or more minutes late.  We strive to give you quality time with our providers, and arriving late affects you and other patients whose appointments are after yours.  Also, if you no show three or more times for appointments you may be dismissed from the clinic at the providers discretion.     Again, thank you for choosing [facility].  Our hope is that these requests will decrease the amount of time that you wait before being seen by our physicians.       _____________________________________________________________  Should you have questions after your visit to [facility], please contact our office at [phone] and follow the prompts.  Our office hours are 8:00 a.m. and 4:30 p.m. Monday - Friday.  Please note that voicemails left after 4:00 p.m. may not be returned until the following business day.  We are closed weekends and major holidays.  You do have  access to a nurse 24-7, just call the main number to the clinic [phone] and do not press any options, hold on the line and a nurse will answer the phone.    For prescription refill requests, have your pharmacy contact our office and allow 72 hours.    Due to Covid, you will need to wear a mask upon entering the hospital. If you do not have a mask, a mask will be given to you at the Main Entrance upon arrival. For doctor visits, patients may have 1 support person age 49 or older with them. For treatment visits, patients can not have anyone with them due to social distancing guidelines and our immunocompromised population.

## 2022-06-10 NOTE — Telephone Encounter (Signed)
Patient seen in [facility] on 06/09/22

## 2022-06-10 NOTE — Progress Notes (Signed)
Patient presents today for chemotherapy treatment.  Patient is in satisfactory condition with no new complaints voiced.  Vital signs are stable.  Labs reviewed by Dr. Delton Coombes during his office visit.   We will hold chemotherapy today due to recent LV thrombus diagnosis.  We will proceed with Imfinzi today per Dr. Delton Coombes.    Patient tolerated treatment well with no complaints voiced.  Patient left ambulatory in stable condition.  Vital signs stable at discharge.  Follow up as scheduled.

## 2022-06-10 NOTE — Progress Notes (Signed)
Patient has been examined by Dr. Delton Coombes, and vital signs and labs have been reviewed. ANC, Creatinine, LFTs, hemoglobin, and platelets are within treatment parameters per M.D. - pt may proceed with treatment.  Imfinzi only today per MD. Primary RN and pharmacy notified.

## 2022-06-11 NOTE — Progress Notes (Signed)
Cardiology Office Note:   Date:  06/12/2022  NAME:  Jonathan Keith    MRN: [id number] DOB:  12-Jan-1956   PCP:  Redmond School, MD  Cardiologist:  Evalina Field, MD  Electrophysiologist:  None   Referring MD: Redmond School, MD   Chief Complaint  Patient presents with   Follow-up    History of Present Illness:   Jonathan Keith is a 66 y.o. male with a hx of systolic heart failure, three-vessel CAD, metastatic cholangiocarcinoma who presents for follow-up.  Echocardiogram shows LV apical thrombus.  He reports he is doing well.  Recent echo shows apical thrombus.  Started on Lovenox and Coumadin.  Ejection fraction shows EF of 45 to 50%.  He has a significant RCA and LAD infarct which is likely the reason for the stasis.  Blood pressure has improved.  I recommended he stop midodrine.  We can probably get him on a beta-blocker.  His anemia is now improving as well.  His cancer seems to be stable.  He does get short of breath with activity.  Symptoms are improving.  Denies any chest pain in office today.  We discussed holding Plavix in the setting of being on Coumadin.  Problem List Systolic HF -EF 00% 2. LV apical thrombus  -06/08/2022 3. CAD -NSTEMI 09/2021 -3v CAD managed medically in setting of advanced cancer  4. Metastatic cholangiocarcinoma  5. HLD -T chol 167, HDL 27, LDL 123, triglycerides 86 6. DM -A1c 5.8 7. Anemia  8. Tobacco abuse  -1ppd x 50 years  9. Bronchioltis  -deferred pulm eval  Past Medical History: Past Medical History:  Diagnosis Date   Cancer ([place])    CHF (congestive heart failure) (HCC)    Chronic pain    neck, back, knees   Class 1 obesity due to excess calories with body mass index (BMI) of 31.0 to 31.9 in adult    Claudication [facility])    Coronary artery disease    DDD (degenerative disc disease), cervical    Diabetes mellitus ([place])    Dyslipidemia    Hypertension    Port-A-Cath in place 07/20/2021   Tobacco dependence     Past  Surgical History: Past Surgical History:  Procedure Laterality Date   BILIARY BRUSHING N/A 02/25/2021   Procedure: BILIARY BRUSHING;  Surgeon: Rogene Houston, MD;  Location: [facility];  Service: Gastroenterology;  Laterality: N/A;   BILIARY STENT PLACEMENT N/A 02/25/2021   Procedure: BILIARY STENT PLACEMENT;  Surgeon: Rogene Houston, MD;  Location: [facility];  Service: Gastroenterology;  Laterality: N/A;   BILIARY STENT PLACEMENT  02/27/2021   Procedure: BILIARY STENT PLACEMENT (10FR x 9cm) IN THE RIGHT SYSTEM;  Surgeon: Rogene Houston, MD;  Location: [facility];  Service: Gastroenterology;;   BREAST SURGERY Left    benign lump- in his 98s.   CARDIAC CATHETERIZATION     ERCP N/A 02/25/2021   Procedure: ENDOSCOPIC RETROGRADE CHOLANGIOPANCREATOGRAPHY (ERCP);  Surgeon: Rogene Houston, MD;  Location: [facility];  Service: Gastroenterology;  Laterality: N/A;   ERCP N/A 02/27/2021   Procedure: ENDOSCOPIC RETROGRADE CHOLANGIOPANCREATOGRAPHY (ERCP);  Surgeon: Rogene Houston, MD;  Location: [facility];  Service: Gastroenterology;  Laterality: N/A;   IR IMAGING GUIDED PORT INSERTION  07/21/2021   KNEE SURGERY Left    x2   LEFT HEART CATH AND CORONARY ANGIOGRAPHY N/A 09/08/2021   Procedure: LEFT HEART CATH AND CORONARY ANGIOGRAPHY;  Surgeon: Early Osmond, MD;  Location: [place] [facility];  Service:  Cardiovascular;  Laterality: N/A;   SPHINCTEROTOMY N/A 02/25/2021   Procedure: SPHINCTEROTOMY;  Surgeon: Rogene Houston, MD;  Location: [facility];  Service: Gastroenterology;  Laterality: N/A;   STENT REMOVAL  02/27/2021   Procedure: STENT REMOVAL (8.5Fr x 9cm);  Surgeon: Rogene Houston, MD;  Location: [facility];  Service: Gastroenterology;;    Current Medications: Current Meds  Medication Sig   acetaminophen (TYLENOL) 500 MG tablet Take 500 mg by mouth every 6 (six) hours as needed for moderate pain or headache.   albuterol (VENTOLIN HFA) 108 (90 Base) MCG/ACT inhaler Inhale 2 puffs into the lungs every 6  (six) hours as needed for wheezing or shortness of breath.   atorvastatin (LIPITOR) 40 MG tablet Take 1 tablet (40 mg total) by mouth daily at 6 PM.   carvedilol (COREG) 6.25 MG tablet Take 1 tablet (6.25 mg total) by mouth 2 (two) times daily.   CISPLATIN IV Inject into the vein once a week. Days 1, 8 every 21 days   Durvalumab (IMFINZI IV) Inject into the vein every 21 ( twenty-one) days.   enoxaparin (LOVENOX) 100 MG/ML injection Inject 1 mL (100 mg total) into the skin daily for 10 days.   famotidine (PEPCID) 20 MG tablet Take 20 mg by mouth 2 (two) times daily.   ferrous sulfate (FERROUSUL) 325 (65 FE) MG tablet Take 1 tablet (325 mg total) by mouth daily with breakfast.   Gemcitabine HCl (GEMZAR IV) Inject into the vein once a week. Days 1, 8 every 21 days   Ginseng 100 MG CAPS Take 1 capsule by mouth daily.   lidocaine-prilocaine (EMLA) cream    loratadine (CLARITIN) 10 MG tablet Take 10 mg by mouth daily. Takes for a few days after shot to boost WBC. About every 2nd treatment   nitroGLYCERIN (NITROSTAT) 0.4 MG SL tablet Place 1 tablet (0.4 mg total) under the tongue every 5 (five) minutes as needed for chest pain.   pantoprazole (PROTONIX) 40 MG tablet Take 1 tablet (40 mg total) by mouth daily.   prochlorperazine (COMPAZINE) 10 MG tablet TAKE (1) TABLET BY MOUTH EVERY (6) HOURS AS NEEDED.   tamsulosin (FLOMAX) 0.4 MG CAPS capsule TAKE (2) CAPSULES BY MOUTH AT BEDTIME.   warfarin (COUMADIN) 5 MG tablet Take 1 tablet (5 mg total) by mouth daily.   [DISCONTINUED] midodrine (PROAMATINE) 5 MG tablet Take 1 tablet (5 mg total) by mouth 3 (three) times daily with meals.     Allergies:    Patient has no known allergies.   Social History: Social History   Socioeconomic History   Marital status: Married    Spouse name: Not on file   Number of children: Not on file   Years of education: Not on file   Highest education level: Not on file  Occupational History   Occupation: Heavy  Company secretary  Tobacco Use   Smoking status: Every Day    Packs/day: 0.50    Years: 56.00    Total pack years: 28.00    Types: Cigarettes   Smokeless tobacco: Never  Vaping Use   Vaping Use: Never used  Substance and Sexual Activity   Alcohol use: Not Currently    Comment: stopped 2.5 years ago 03/11/21   Drug use: Never   Sexual activity: Not on file  Other Topics Concern   Not on file  Social History Narrative   Not on file   Social Determinants of Health   Financial Resource Strain: Medium Risk (03/07/2021)  Overall Financial Resource Strain (CARDIA)    Difficulty of Paying Living Expenses: Somewhat hard  Food Insecurity: No Food Insecurity (03/07/2021)   Hunger Vital Sign    Worried About Running Out of Food in the Last Year: Never true    Ran Out of Food in the Last Year: Never true  Transportation Needs: No Transportation Needs (03/07/2021)   PRAPARE - Hydrologist (Medical): No    Lack of Transportation (Non-Medical): No  Physical Activity: Insufficiently Active (03/07/2021)   Exercise Vital Sign    Days of Exercise per Week: 2 days    Minutes of Exercise per Session: 20 min  Stress: No Stress Concern Present (03/07/2021)   [employer]    Feeling of Stress : Only a little  Social Connections: Moderately Integrated (03/07/2021)   Social Connection and Isolation Panel [NHANES]    Frequency of Communication with Friends and Family: More than three times a week    Frequency of Social Gatherings with Friends and Family: More than three times a week    Attends Religious Services: More than 4 times per year    Active Member of [employer] or Organizations: No    Attends Archivist Meetings: Never    Marital Status: Married     Family History: The patient's family history includes CAD in his mother. There is no history of Cancer.  ROS:   All other ROS reviewed and  negative. Pertinent positives noted in the HPI.     EKGs/Labs/Other Studies Reviewed:   The following studies were personally reviewed by me today:  EKG:  EKG is ordered today.  The ekg ordered today demonstrates sinus tachycardia heart rate 111, inferior infarct, and was personally reviewed by me.   TTE 06/08/2022  1. A mass is present in the LV 1.6 cm x 1.2 cm. There is apical akinesis.  This is likely an LV thrombus. Apical akinesis. Basal to mid inferior  akinesis, and this segment appears aneurysmal. Findings consistent with  ischemic cardiomyopathy. Patient  instructed to go to the ER due to LV thrombus for initation of  anticoagulation. Left ventricular ejection fraction, by estimation, is 45  to 50%. The left ventricle has mildly decreased function. The left  ventricle demonstrates regional wall motion  abnormalities (see scoring diagram/findings for description).  Indeterminate diastolic filling due to E-A fusion.   2. Right ventricular systolic function is normal. The right ventricular  size is normal. Tricuspid regurgitation signal is inadequate for assessing  PA pressure.   3. The mitral valve is grossly normal. Trivial mitral valve  regurgitation. No evidence of mitral stenosis.   4. The aortic valve was not well visualized. Aortic valve regurgitation  is not visualized.   5. Aortic dilatation noted. There is mild dilatation of the aortic root,  measuring 40 mm.   6. The inferior vena cava is normal in size with greater than 50%  respiratory variability, suggesting right atrial pressure of 3 mmHg.   Recent Labs: 11/26/2021: B Natriuretic Peptide 518.0 04/27/2022: TSH 0.865 06/10/2022: ALT 40; BUN 13; Creatinine, Ser 1.16; Hemoglobin 10.8; Magnesium 1.7; Platelets 90; Potassium 3.9; Sodium 132   Recent Lipid Panel    Component Value Date/Time   CHOL 167 09/09/2021 0111   TRIG 86 09/09/2021 0111   HDL 27 (L) 09/09/2021 0111   CHOLHDL 6.2 09/09/2021 0111   VLDL 17  09/09/2021 0111   LDLCALC 123 (H) 09/09/2021 0111  Physical Exam:   VS:  BP (!) 148/90 (BP Location: Right Arm, Patient Position: Sitting, Cuff Size: Normal)   Pulse (!) 111   Ht '5\' 11"'$  (1.803 m)   Wt 156 lb (70.8 kg)   BMI 21.76 kg/m    Wt Readings from Last 3 Encounters:  06/12/22 156 lb (70.8 kg)  06/10/22 156 lb 3.2 oz (70.9 kg)  06/10/22 156 lb 3.2 oz (70.9 kg)    General: Well nourished, well developed, in no acute distress Head: Atraumatic, normal size  Eyes: PEERLA, EOMI  Neck: Supple, no JVD Endocrine: No thryomegaly Cardiac: Normal S1, S2; RRR; no murmurs, rubs, or gallops Lungs: Clear to auscultation bilaterally, no wheezing, rhonchi or rales  Abd: Soft, nontender, no hepatomegaly  Ext: No edema, pulses 2+ Musculoskeletal: No deformities, BUE and BLE strength normal and equal Skin: Warm and dry, no rashes   Neuro: Alert and oriented to person, place, time, and situation, CNII-XII grossly intact, no focal deficits  Psych: Normal mood and affect   ASSESSMENT:   Jonathan Keith is a 66 y.o. male who presents for the following: 1. LV (left ventricular) mural thrombus following MI ([place])   2. Chronic systolic heart failure ([place])   3. Coronary artery disease involving native coronary artery of native heart without angina pectoris   4. Mixed hyperlipidemia     PLAN:   1. LV (left ventricular) mural thrombus following MI ([place]) 2. Chronic systolic heart failure ([place]) 3. Coronary artery disease involving native coronary artery of native heart without angina pectoris 4. Mixed hyperlipidemia -Known history of an ischemic cardiomyopathy with three-vessel CAD.  CABG was deferred due to metastatic cholangiocarcinoma.  His ejection fraction overall is 45%.  He does have wall motion abnormalities in the LAD and RCA distribution.  He has no symptoms of congestive heart failure.  His anemia is improving.  He is not a candidate for PCI or CABG.  He now has an LV thrombus.  I  recommended Coumadin with Lovenox bridging.  He will continue this.  We will stop Plavix.  He has had recurrent anemia given his malignancy.  Do not want to expose him to any unnecessary bleeding risk.  No symptoms of angina.  On high intensity statin.  His hypotension has improved.  I suspect chemotherapy was the culprit.  Stop midodrine.  Start Coreg 6.25 mg twice daily.  Further titration of medical therapy as we are able.  Kidney function is stable.  Plan to recheck an echo in 3 months to determine if he has resolution of LV thrombus.  Overall I believe he will end up on anticoagulation indefinitely in the setting of an ischemic cardiomyopathy and advanced malignancy.  He will see me back in 3 months for further discussion.   Chest CT with bronchiolitis.  He does not want to see a pulmonologist.  He is still smoking. -Denies any symptoms of angina.  Continue with medical therapy as we are able.     Disposition: Return in about 3 months (around 09/12/2022).  Medication Adjustments/Labs and Tests Ordered: Current medicines are reviewed at length with the patient today.  Concerns regarding medicines are outlined above.  Orders Placed This Encounter  Procedures   EKG 12-Lead   ECHOCARDIOGRAM COMPLETE   Meds ordered this encounter  Medications   carvedilol (COREG) 6.25 MG tablet    Sig: Take 1 tablet (6.25 mg total) by mouth 2 (two) times daily.    Dispense:  180 tablet    Refill:  3    Patient Instructions  Medication Instructions:  STOP Midodrine   START Carvedilol 6.25 twice daily  - take BP x1 daily-   *If you need a refill on your cardiac medications before your next appointment, please call your pharmacy*   Testing/Procedures: Echocardiogram (3 months) - Your physician has requested that you have an echocardiogram. Echocardiography is a painless test that uses sound waves to create images of your heart. It provides your doctor with information about the size and shape of your  heart and how well your heart's chambers and valves are working. This procedure takes approximately one hour. There are no restrictions for this procedure.     Follow-Up: At [facility], you and your health needs are our priority.  As part of our continuing mission to provide you with exceptional heart care, we have created designated Provider Care Teams.  These Care Teams include your primary Cardiologist (physician) and Advanced Practice Providers (APPs -  Physician Assistants and Nurse Practitioners) who all work together to provide you with the care you need, when you need it.  We recommend signing up for the patient portal called "MyChart".  Sign up information is provided on this After Visit Summary.  MyChart is used to connect with patients for Virtual Visits (Telemedicine).  Patients are able to view lab/test results, encounter notes, upcoming appointments, etc.  Non-urgent messages can be sent to your provider as well.   To learn more about what you can do with MyChart, go to [email or web address].    Your next appointment:   3 month(s)  The format for your next appointment:   In Person  Provider:   Evalina Field, MD            Time Spent with Patient: I have spent a total of 35 minutes with patient reviewing hospital notes, telemetry, EKGs, labs and examining the patient as well as establishing an assessment and plan that was discussed with the patient.  > 50% of time was spent in direct patient care.  Signed, Addison Naegeli. Audie Box, MD, [place]  [address] [place], [place] [address] [phone]  06/12/2022 9:57 AM

## 2022-06-12 ENCOUNTER — Encounter: Payer: Self-pay | Admitting: Cardiovascular Disease

## 2022-06-12 ENCOUNTER — Ambulatory Visit: Payer: Medicare Other | Attending: Cardiovascular Disease | Admitting: Cardiovascular Disease

## 2022-06-12 VITALS — BP 148/90 | HR 111 | Ht 71.0 in | Wt 156.0 lb

## 2022-06-12 DIAGNOSIS — I5022 Chronic systolic (congestive) heart failure: Secondary | ICD-10-CM | POA: Insufficient documentation

## 2022-06-12 DIAGNOSIS — I2510 Atherosclerotic heart disease of native coronary artery without angina pectoris: Secondary | ICD-10-CM | POA: Diagnosis not present

## 2022-06-12 DIAGNOSIS — I236 Thrombosis of atrium, auricular appendage, and ventricle as current complications following acute myocardial infarction: Secondary | ICD-10-CM | POA: Insufficient documentation

## 2022-06-12 DIAGNOSIS — E782 Mixed hyperlipidemia: Secondary | ICD-10-CM | POA: Diagnosis not present

## 2022-06-12 MED ORDER — CARVEDILOL 6.25 MG PO TABS: 6.2500 mg | ORAL_TABLET | Freq: Two times a day (BID) | ORAL | 3 refills | Status: DC

## 2022-06-12 MED ORDER — PERFLUTREN LIPID MICROSPHERE
1.0000 mL | INTRAVENOUS | Status: AC | PRN
  Administered 2022-06-08: 1 mL via INTRAVENOUS

## 2022-06-12 NOTE — Patient Instructions (Signed)
Medication Instructions:  STOP Midodrine   START Carvedilol 6.25 twice daily  - take BP x1 daily-   *If you need a refill on your cardiac medications before your next appointment, please call your pharmacy*   Testing/Procedures: Echocardiogram (3 months) - Your physician has requested that you have an echocardiogram. Echocardiography is a painless test that uses sound waves to create images of your heart. It provides your doctor with information about the size and shape of your heart and how well your heart's chambers and valves are working. This procedure takes approximately one hour. There are no restrictions for this procedure.     Follow-Up: At [facility], you and your health needs are our priority.  As part of our continuing mission to provide you with exceptional heart care, we have created designated Provider Care Teams.  These Care Teams include your primary Cardiologist (physician) and Advanced Practice Providers (APPs -  Physician Assistants and Nurse Practitioners) who all work together to provide you with the care you need, when you need it.  We recommend signing up for the patient portal called "MyChart".  Sign up information is provided on this After Visit Summary.  MyChart is used to connect with patients for Virtual Visits (Telemedicine).  Patients are able to view lab/test results, encounter notes, upcoming appointments, etc.  Non-urgent messages can be sent to your provider as well.   To learn more about what you can do with MyChart, go to [email or web address].    Your next appointment:   3 month(s)  The format for your next appointment:   In Person  Provider:   Evalina Field, MD

## 2022-06-13 ENCOUNTER — Other Ambulatory Visit: Payer: Self-pay

## 2022-06-15 ENCOUNTER — Ambulatory Visit: Payer: Medicare Other | Attending: Cardiovascular Disease | Admitting: *Deleted

## 2022-06-15 DIAGNOSIS — Z5181 Encounter for therapeutic drug level monitoring: Secondary | ICD-10-CM

## 2022-06-15 DIAGNOSIS — I513 Intracardiac thrombosis, not elsewhere classified: Secondary | ICD-10-CM | POA: Diagnosis not present

## 2022-06-15 LAB — POCT INR: INR: 1.8 — AB (ref 2.0–3.0)

## 2022-06-15 NOTE — Patient Instructions (Signed)
Take warfarin 1 1/2 tablets tonight and tomorrow night, take 1 tablet Wednesday night and recheck INR on Thursday Continue Lovenox '100mg'$  SQ daily at 6pm until next appt Recheck INR on 06/18/22

## 2022-06-16 ENCOUNTER — Other Ambulatory Visit: Payer: Self-pay

## 2022-06-18 ENCOUNTER — Ambulatory Visit: Payer: Medicare Other | Attending: Cardiovascular Disease | Admitting: *Deleted

## 2022-06-18 DIAGNOSIS — Z5181 Encounter for therapeutic drug level monitoring: Secondary | ICD-10-CM

## 2022-06-18 DIAGNOSIS — I513 Intracardiac thrombosis, not elsewhere classified: Secondary | ICD-10-CM

## 2022-06-18 LAB — POCT INR: INR: 3.8 — AB (ref 2.0–3.0)

## 2022-06-22 ENCOUNTER — Other Ambulatory Visit (HOSPITAL_COMMUNITY): Payer: Medicare Other

## 2022-06-23 ENCOUNTER — Ambulatory Visit: Payer: Medicare Other | Attending: Cardiovascular Disease | Admitting: *Deleted

## 2022-06-23 DIAGNOSIS — Z5181 Encounter for therapeutic drug level monitoring: Secondary | ICD-10-CM | POA: Diagnosis not present

## 2022-06-23 DIAGNOSIS — I513 Intracardiac thrombosis, not elsewhere classified: Secondary | ICD-10-CM | POA: Diagnosis not present

## 2022-06-23 LAB — POCT INR: INR: 3 (ref 2.0–3.0)

## 2022-06-23 NOTE — Patient Instructions (Signed)
Continue warfarin 1 tablet daily Recheck INR in 2 wks

## 2022-06-24 ENCOUNTER — Other Ambulatory Visit: Payer: Medicare Other

## 2022-06-24 ENCOUNTER — Ambulatory Visit: Payer: Medicare Other

## 2022-07-01 ENCOUNTER — Telehealth: Payer: Self-pay | Admitting: *Deleted

## 2022-07-01 NOTE — Telephone Encounter (Signed)
Patient's wife called to make [place] aware that he was started on an antibiotic over the weekend for Sinus infection and UTI.  Prescription is for 28 days.  He is for treatment on 11/29.  Advised that Dr. Delton Coombes will assess him at visit and determine if he can proceed with treatment that day.  Verbalized understanding.

## 2022-07-07 ENCOUNTER — Ambulatory Visit: Payer: Medicare Other | Attending: Cardiovascular Disease | Admitting: *Deleted

## 2022-07-07 DIAGNOSIS — I513 Intracardiac thrombosis, not elsewhere classified: Secondary | ICD-10-CM | POA: Diagnosis not present

## 2022-07-07 DIAGNOSIS — Z5181 Encounter for therapeutic drug level monitoring: Secondary | ICD-10-CM

## 2022-07-07 LAB — POCT INR: INR: 3.3 — AB (ref 2.0–3.0)

## 2022-07-07 NOTE — Patient Instructions (Signed)
Decrease warfarin to 1 tablet daily except 1/2 tablet every Tuesday Recheck INR in 2 wks

## 2022-07-08 ENCOUNTER — Inpatient Hospital Stay (HOSPITAL_BASED_OUTPATIENT_CLINIC_OR_DEPARTMENT_OTHER): Payer: Medicare Other | Admitting: Hematology

## 2022-07-08 ENCOUNTER — Inpatient Hospital Stay: Payer: Medicare Other

## 2022-07-08 VITALS — BP 162/78 | HR 67 | Temp 98.2°F | Resp 16

## 2022-07-08 DIAGNOSIS — C221 Intrahepatic bile duct carcinoma: Secondary | ICD-10-CM

## 2022-07-08 DIAGNOSIS — Z95828 Presence of other vascular implants and grafts: Secondary | ICD-10-CM | POA: Diagnosis not present

## 2022-07-08 DIAGNOSIS — Z5112 Encounter for antineoplastic immunotherapy: Secondary | ICD-10-CM | POA: Diagnosis not present

## 2022-07-08 DIAGNOSIS — Z79899 Other long term (current) drug therapy: Secondary | ICD-10-CM

## 2022-07-08 LAB — CBC WITH DIFFERENTIAL/PLATELET
Abs Immature Granulocytes: 0.02 10*3/uL (ref 0.00–0.07)
Basophils Absolute: 0.1 10*3/uL (ref 0.0–0.1)
Basophils Relative: 1 %
Eosinophils Absolute: 0.4 10*3/uL (ref 0.0–0.5)
Eosinophils Relative: 6 %
HCT: 33.9 % — ABNORMAL LOW (ref 39.0–52.0)
Hemoglobin: 11.4 g/dL — ABNORMAL LOW (ref 13.0–17.0)
Immature Granulocytes: 0 %
Lymphocytes Relative: 18 %
Lymphs Abs: 1 10*3/uL (ref 0.7–4.0)
MCH: 33.7 pg (ref 26.0–34.0)
MCHC: 33.6 g/dL (ref 30.0–36.0)
MCV: 100.3 fL — ABNORMAL HIGH (ref 80.0–100.0)
Monocytes Absolute: 0.8 10*3/uL (ref 0.1–1.0)
Monocytes Relative: 13 %
Neutro Abs: 3.7 10*3/uL (ref 1.7–7.7)
Neutrophils Relative %: 62 %
Platelets: 69 10*3/uL — ABNORMAL LOW (ref 150–400)
RBC: 3.38 MIL/uL — ABNORMAL LOW (ref 4.22–5.81)
RDW: 14 % (ref 11.5–15.5)
WBC: 5.9 10*3/uL (ref 4.0–10.5)
nRBC: 0 % (ref 0.0–0.2)

## 2022-07-08 LAB — COMPREHENSIVE METABOLIC PANEL
ALT: 40 U/L (ref 0–44)
AST: 53 U/L — ABNORMAL HIGH (ref 15–41)
Albumin: 2.8 g/dL — ABNORMAL LOW (ref 3.5–5.0)
Alkaline Phosphatase: 243 U/L — ABNORMAL HIGH (ref 38–126)
Anion gap: 9 (ref 5–15)
BUN: 16 mg/dL (ref 8–23)
CO2: 22 mmol/L (ref 22–32)
Calcium: 8.7 mg/dL — ABNORMAL LOW (ref 8.9–10.3)
Chloride: 101 mmol/L (ref 98–111)
Creatinine, Ser: 1.27 mg/dL — ABNORMAL HIGH (ref 0.61–1.24)
GFR, Estimated: >60 mL/min (ref >60)
Glucose, Bld: 189 mg/dL — ABNORMAL HIGH (ref 70–99)
Potassium: 4.3 mmol/L (ref 3.5–5.1)
Sodium: 132 mmol/L — ABNORMAL LOW (ref 135–145)
Total Bilirubin: 1.2 mg/dL (ref 0.3–1.2)
Total Protein: 6.8 g/dL (ref 6.5–8.1)

## 2022-07-08 LAB — TSH: TSH: 0.8 u[IU]/mL (ref 0.350–4.500)

## 2022-07-08 LAB — MAGNESIUM: Magnesium: 1.9 mg/dL (ref 1.7–2.4)

## 2022-07-08 MED ORDER — SODIUM CHLORIDE 0.9 % IV SOLN
1500.0000 mg | Freq: Once | INTRAVENOUS | Status: AC
  Administered 2022-07-08: 1500 mg via INTRAVENOUS
  Filled 2022-07-08: qty 30

## 2022-07-08 MED ORDER — SODIUM CHLORIDE 0.9 % IV SOLN: INTRAVENOUS | Status: DC

## 2022-07-08 MED ORDER — HEPARIN SOD (PORK) LOCK FLUSH 100 UNIT/ML IV SOLN
500.0000 [IU] | Freq: Once | INTRAVENOUS | Status: AC | PRN
  Administered 2022-07-08: 500 [IU]

## 2022-07-08 MED ORDER — SODIUM CHLORIDE 0.9% FLUSH
10.0000 mL | INTRAVENOUS | Status: DC | PRN
  Administered 2022-07-08: 10 mL

## 2022-07-08 NOTE — Progress Notes (Signed)
Patient has been examined by Dr. Delton Coombes, and vital signs and labs have been reviewed. ANC, Creatinine, LFTs, hemoglobin, and platelets are within treatment parameters per M.D. - pt may proceed with treatment.  Imfinzi only today. Primary RN and pharmacy notified.

## 2022-07-08 NOTE — Progress Notes (Signed)
Jonathan Keith, [place] [address]   CLINIC:  Medical Oncology/Hematology  PCP:  [facility], [place] / [place] [address] [phone]   REASON FOR VISIT:  Follow-up for intrahepatic cholangiocarcinoma  PRIOR THERAPY: none  NGS Results: not done  CURRENT THERAPY: Cisplatin + Gemcitabine D1, D15 q 28d  BRIEF ONCOLOGIC HISTORY:  Oncology History  Cholangiocarcinoma ([place])  02/28/2021 Initial Diagnosis   Cholangiocarcinoma ([facility])   07/17/2021 Cancer Staging   Staging form: Perihilar Bile Ducts, AJCC 8th Edition - Clinical stage from 07/17/2021: Stage IVB (cTX, cNX, pM1) - Signed by Derek Jack, MD on 07/17/2021 Stage prefix: Initial diagnosis   07/22/2021 - 04/01/2022 Chemotherapy   Patient is on Treatment Plan : BILIARY TRACT Cisplatin + Gemcitabine D1,15 + Imfinzi day 1 q28d     07/22/2021 -  Chemotherapy   Patient is on Treatment Plan : BILIARY TRACT Cisplatin + Gemcitabine D1,8 q21d       CANCER STAGING:  Cancer Staging  Cholangiocarcinoma ([facility]) Staging form: Perihilar Bile Ducts, AJCC 8th Edition - Clinical stage from 07/17/2021: Stage IVB (cTX, cNX, pM1) - Signed by Derek Jack, MD on 07/17/2021   INTERVAL HISTORY:  [name], a 66 y.o. male, seen for follow-up of cholangiocarcinoma and toxicity assessment prior to next cycle of therapy.  Denies any immunotherapy related side effects.  Chronic urinary frequency at nighttime is stable.  He is napping more at daytime per wife.  He reports that increased frequency at nighttime is disturbing his sleep.  REVIEW OF SYSTEMS:  Review of Systems  Gastrointestinal:  Negative for diarrhea.  Genitourinary:  Positive for frequency.   Skin:  Negative for rash.  Neurological:  Negative for numbness.  Psychiatric/Behavioral:  Positive for sleep disturbance.   All other systems reviewed and are negative.   PAST MEDICAL/SURGICAL HISTORY:  Past  Medical History:  Diagnosis Date   Cancer ([facility])    CHF (congestive heart failure) (HCC)    Chronic pain    neck, back, knees   Class 1 obesity due to excess calories with body mass index (BMI) of 31.0 to 31.9 in adult    [facility]    Coronary artery disease    DDD (degenerative disc disease), cervical    Diabetes mellitus ([place])    Dyslipidemia    Hypertension    Port-A-Cath in place 07/20/2021   Tobacco dependence    Past Surgical History:  Procedure Laterality Date   BILIARY BRUSHING N/A 02/25/2021   Procedure: BILIARY BRUSHING;  Surgeon: Rogene Houston, MD;  Location: [facility];  Service: Gastroenterology;  Laterality: N/A;   BILIARY STENT PLACEMENT N/A 02/25/2021   Procedure: BILIARY STENT PLACEMENT;  Surgeon: Rogene Houston, MD;  Location: [facility];  Service: Gastroenterology;  Laterality: N/A;   BILIARY STENT PLACEMENT  02/27/2021   Procedure: BILIARY STENT PLACEMENT (10FR x 9cm) IN THE RIGHT SYSTEM;  Surgeon: Rogene Houston, MD;  Location: [facility];  Service: Gastroenterology;;   BREAST SURGERY Left    benign lump- in his 8s.   CARDIAC CATHETERIZATION     ERCP N/A 02/25/2021   Procedure: ENDOSCOPIC RETROGRADE CHOLANGIOPANCREATOGRAPHY (ERCP);  Surgeon: Rogene Houston, MD;  Location: [facility];  Service: Gastroenterology;  Laterality: N/A;   ERCP N/A 02/27/2021   Procedure: ENDOSCOPIC RETROGRADE CHOLANGIOPANCREATOGRAPHY (ERCP);  Surgeon: Rogene Houston, MD;  Location: [facility];  Service: Gastroenterology;  Laterality: N/A;   IR IMAGING GUIDED PORT INSERTION  07/21/2021  KNEE SURGERY Left    x2   LEFT HEART CATH AND CORONARY ANGIOGRAPHY N/A 09/08/2021   Procedure: LEFT HEART CATH AND CORONARY ANGIOGRAPHY;  Surgeon: Early Osmond, MD;  Location: [facility];  Service: Cardiovascular;  Laterality: N/A;   SPHINCTEROTOMY N/A 02/25/2021   Procedure: SPHINCTEROTOMY;  Surgeon: Rogene Houston, MD;  Location: [facility];  Service: Gastroenterology;  Laterality: N/A;    STENT REMOVAL  02/27/2021   Procedure: STENT REMOVAL (8.5Fr x 9cm);  Surgeon: Rogene Houston, MD;  Location: [facility];  Service: Gastroenterology;;    SOCIAL HISTORY:  Social History   Socioeconomic History   Marital status: Married    Spouse name: Not on file   Number of children: Not on file   Years of education: Not on file   Highest education level: Not on file  Occupational History   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  Tobacco Use   Smoking status: Every Day    Packs/day: 0.50    Years: 56.00    Total pack years: 28.00    Types: Cigarettes   Smokeless tobacco: Never  Vaping Use   Vaping Use: Never used  Substance and Sexual Activity   Alcohol use: Not Currently    Comment: stopped 2.5 years ago 03/11/21   Drug use: Never   Sexual activity: Not on file  Other Topics Concern   Not on file  Social History Narrative   Not on file   Social Determinants of Health   Financial Resource Strain: Medium Risk (03/07/2021)   Overall Financial Resource Strain (CARDIA)    Difficulty of Paying Living Expenses: Somewhat hard  Food Insecurity: No Food Insecurity (03/07/2021)   Hunger Vital Sign    Worried About Running Out of Food in the Last Year: Never true    [place] in the Last Year: Never true  Transportation Needs: No Transportation Needs (03/07/2021)   PRAPARE - Hydrologist (Medical): No    Lack of Transportation (Non-Medical): No  Physical Activity: Insufficiently Active (03/07/2021)   Exercise Vital Sign    Days of Exercise per Week: 2 days    Minutes of Exercise per Session: 20 min  Stress: No Stress Concern Present (03/07/2021)   [place]    Feeling of Stress : Only a little  Social Connections: Moderately Integrated (03/07/2021)   Social Connection and Isolation Panel [NHANES]    Frequency of Communication with Friends and Family: More than three times a week     Frequency of Social Gatherings with Friends and Family: More than three times a week    Attends Religious Services: More than 4 times per year    Active Member of [employer] or Organizations: No    Attends Archivist Meetings: Never    Marital Status: Married  Human resources officer Violence: Not At Risk (03/07/2021)   Humiliation, Afraid, Rape, and Kick questionnaire    Fear of Current or Ex-Partner: No    Emotionally Abused: No    Physically Abused: No    Sexually Abused: No    FAMILY HISTORY:  Family History  Problem Relation Age of Onset   CAD Mother    Cancer Neg Hx     CURRENT MEDICATIONS:  Current Outpatient Medications  Medication Sig Dispense Refill   acetaminophen (TYLENOL) 500 MG tablet Take 500 mg by mouth every 6 (six) hours as needed for moderate pain or headache.  albuterol (VENTOLIN HFA) 108 (90 Base) MCG/ACT inhaler Inhale 2 puffs into the lungs every 6 (six) hours as needed for wheezing or shortness of breath. 6.7 g 1   atorvastatin (LIPITOR) 40 MG tablet Take 1 tablet (40 mg total) by mouth daily at 6 PM. 90 tablet 3   carvedilol (COREG) 6.25 MG tablet Take 1 tablet (6.25 mg total) by mouth 2 (two) times daily. 180 tablet 3   cephALEXin (KEFLEX) 500 MG capsule Take 500 mg by mouth every 6 (six) hours.     CISPLATIN IV Inject into the vein once a week. Days 1, 8 every 21 days     Durvalumab (IMFINZI IV) Inject into the vein every 21 ( twenty-one) days.     enoxaparin (LOVENOX) 100 MG/ML injection Inject 1 mL (100 mg total) into the skin daily for 10 days. 10 mL 0   famotidine (PEPCID) 20 MG tablet Take 20 mg by mouth 2 (two) times daily.     ferrous sulfate (FERROUSUL) 325 (65 FE) MG tablet Take 1 tablet (325 mg total) by mouth daily with breakfast.     Gemcitabine HCl (GEMZAR IV) Inject into the vein once a week. Days 1, 8 every 21 days     Ginseng 100 MG CAPS Take 1 capsule by mouth daily.     lidocaine-prilocaine (EMLA) cream      loratadine (CLARITIN) 10 MG  tablet Take 10 mg by mouth daily. Takes for a few days after shot to boost WBC. About every 2nd treatment     metFORMIN (GLUCOPHAGE) 500 MG tablet Take 1 tablet (500 mg total) by mouth 2 (two) times daily with a meal. 60 tablet 0   nitroGLYCERIN (NITROSTAT) 0.4 MG SL tablet Place 1 tablet (0.4 mg total) under the tongue every 5 (five) minutes as needed for chest pain. 25 tablet 3   pantoprazole (PROTONIX) 40 MG tablet Take 1 tablet (40 mg total) by mouth daily. 90 tablet 3   prochlorperazine (COMPAZINE) 10 MG tablet TAKE (1) TABLET BY MOUTH EVERY (6) HOURS AS NEEDED. 90 tablet 3   tamsulosin (FLOMAX) 0.4 MG CAPS capsule TAKE (2) CAPSULES BY MOUTH AT BEDTIME. 60 capsule 6   Vitamin D, Ergocalciferol, (DRISDOL) 1.25 MG (50000 UNIT) CAPS capsule Take 1 capsule by mouth once a week.     warfarin (COUMADIN) 5 MG tablet Take 1 tablet (5 mg total) by mouth daily. 45 tablet 3   No current facility-administered medications for this visit.    ALLERGIES:  No Known Allergies  PHYSICAL EXAM:  Performance status (ECOG): 0 - Asymptomatic  There were no vitals filed for this visit. Wt Readings from Last 3 Encounters:  07/08/22 155 lb 9.6 oz (70.6 kg)  06/12/22 156 lb (70.8 kg)  06/10/22 156 lb 3.2 oz (70.9 kg)   Physical Exam Vitals reviewed.  Constitutional:      Appearance: Normal appearance.  Cardiovascular:     Rate and Rhythm: Normal rate and regular rhythm.     Pulses: Normal pulses.     Heart sounds: Normal heart sounds.  Pulmonary:     Effort: Pulmonary effort is normal.     Breath sounds: Normal breath sounds.  Abdominal:     Palpations: Abdomen is soft. There is no mass.     Tenderness: There is no abdominal tenderness.  Musculoskeletal:     Right lower leg: No edema.     Left lower leg: No edema.  Neurological:     General: No focal deficit present.  Mental Status: He is alert and oriented to person, place, and time.  Psychiatric:        Mood and Affect: Mood normal.         Behavior: Behavior normal.    LABORATORY DATA:  I have reviewed the labs as listed.     Latest Ref Rng & Units 06/10/2022    7:59 AM 06/08/2022    3:51 PM 05/27/2022    8:07 AM  CBC  WBC 4.0 - 10.5 K/uL 4.2  5.8  4.6   Hemoglobin 13.0 - 17.0 g/dL 10.8  10.6  10.1   Hematocrit 39.0 - 52.0 % 32.3  30.8  30.1   Platelets 150 - 400 K/uL 90  71  75       Latest Ref Rng & Units 06/10/2022    7:59 AM 06/08/2022    3:51 PM 05/27/2022    8:07 AM  CMP  Glucose 70 - 99 mg/dL 186  146  161   BUN 8 - 23 mg/dL _0 Creatinine 0.61 - 1.24 mg/dL 1.16  1.15  1.17   Sodium 135 - 145 mmol/L 132  131  131   Potassium 3.5 - 5.1 mmol/L 3.9  3.9  4.1   Chloride 98 - 111 mmol/L 102  99  99   CO2 22 - 32 mmol/L _1 Calcium 8.9 - 10.3 mg/dL 8.5  8.9  8.6   Total Protein 6.5 - 8.1 g/dL 6.7  6.5  6.3   Total Bilirubin 0.3 - 1.2 mg/dL 1.6  1.5  1.3   Alkaline Phos 38 - 126 U/L 240  263  236   AST 15 - 41 U/L 49  41  54   ALT 0 - 44 U/L 40  37  41     DIAGNOSTIC IMAGING:  I have independently reviewed the scans and discussed with the patient. ECHOCARDIOGRAM COMPLETE  Result Date: 06/09/2022    ECHOCARDIOGRAM REPORT   Patient Name:   Jonathan Keith Date of Exam: 06/08/2022 Medical Rec #:  [id number]       Height:       70.0 in Accession #:    [id number]      Weight:       157.4 lb Date of Birth:  [birth year]       BSA:          1.885 m Patient Age:    70 years        BP:           130/74 mmHg Patient Gender: M               HR:           99 bpm. Exam Location:  Outpatient Procedure: 2D Echo, Intracardiac Opacification Agent, Cardiac Doppler and Color            Doppler Indications:     I51.3 (ICD-10-CM) - Left ventricular thrombus  History:         Patient has prior history of Echocardiogram examinations, most                  recent 11/27/2021. Previous Myocardial Infarction; Risk                  Factors:Current Smoker, Dyslipidemia, Diabetes and                  Hypertension.   Sonographer:  Leavy Cella RDCS Referring Phys:  [place] Diagnosing Phys: Eleonore Chiquito MD IMPRESSIONS  1. A mass is present in the LV 1.6 cm x 1.2 cm. There is apical akinesis. This is likely an LV thrombus. Apical akinesis. Basal to mid inferior akinesis, and this segment appears aneurysmal. Findings consistent with ischemic cardiomyopathy. Patient instructed to go to the ER due to LV thrombus for initation of anticoagulation. Left ventricular ejection fraction, by estimation, is 45 to 50%. The left ventricle has mildly decreased function. The left ventricle demonstrates regional wall motion abnormalities (see scoring diagram/findings for description). Indeterminate diastolic filling due to E-A fusion.  2. Right ventricular systolic function is normal. The right ventricular size is normal. Tricuspid regurgitation signal is inadequate for assessing PA pressure.  3. The mitral valve is grossly normal. Trivial mitral valve regurgitation. No evidence of mitral stenosis.  4. The aortic valve was not well visualized. Aortic valve regurgitation is not visualized.  5. Aortic dilatation noted. There is mild dilatation of the aortic root, measuring 40 mm.  6. The inferior vena cava is normal in size with greater than 50% respiratory variability, suggesting right atrial pressure of 3 mmHg. FINDINGS  Left Ventricle: A mass is present in the LV 1.6 cm x 1.2 cm. There is apical akinesis. This is likely an LV thrombus. Apical akinesis. Basal to mid inferior akinesis, and this segment appears aneurysmal. Findings consistent with ischemic cardiomyopathy.  Patient instructed to go to the ER due to LV thrombus for initation of anticoagulation. Left ventricular ejection fraction, by estimation, is 45 to 50%. The left ventricle has mildly decreased function. The left ventricle demonstrates regional wall motion abnormalities. The left ventricular internal cavity size was normal in size. There is no left  ventricular hypertrophy. Indeterminate diastolic filling due to E-A fusion.  LV Wall Scoring: The inferior wall and apex are akinetic. Right Ventricle: The right ventricular size is normal. No increase in right ventricular wall thickness. Right ventricular systolic function is normal. Tricuspid regurgitation signal is inadequate for assessing PA pressure. Left Atrium: Left atrial size was normal in size. Right Atrium: Right atrial size was normal in size. Pericardium: There is no evidence of pericardial effusion. Mitral Valve: The mitral valve is grossly normal. Trivial mitral valve regurgitation. No evidence of mitral valve stenosis. Tricuspid Valve: The tricuspid valve is grossly normal. Tricuspid valve regurgitation is trivial. No evidence of tricuspid stenosis. Aortic Valve: The aortic valve was not well visualized. Aortic valve regurgitation is not visualized. Pulmonic Valve: The pulmonic valve was not well visualized. Pulmonic valve regurgitation is not visualized. Aorta: Aortic dilatation noted. There is mild dilatation of the aortic root, measuring 40 mm. Venous: The inferior vena cava is normal in size with greater than 50% respiratory variability, suggesting right atrial pressure of 3 mmHg. IAS/Shunts: The atrial septum is grossly normal.  LEFT VENTRICLE PLAX 2D LVIDd:         5.46 cm   Diastology LVIDs:         4.12 cm   LV e' medial:    9.36 cm/s LV PW:         0.92 cm   LV E/e' medial:  8.8 LV IVS:        0.95 cm   LV e' lateral:   9.57 cm/s LVOT diam:     2.10 cm   LV E/e' lateral: 8.7 LV SV:         39 LV SV Index:   21 LVOT Area:  3.46 cm                           3D Volume EF:                          3D EF:        30 %                          LV EDV:       168 ml                          LV ESV:       118 ml                          LV SV:        50 ml RIGHT VENTRICLE RV Basal diam:  2.79 cm RV Mid diam:    2.48 cm RV S prime:     14.70 cm/s TAPSE (M-mode): 2.6 cm LEFT ATRIUM             Index         RIGHT ATRIUM          Index LA diam:        3.60 cm 1.91 cm/m   RA Area:     7.78 cm LA Vol (A2C):   38.8 ml 20.58 ml/m  RA Volume:   14.70 ml 7.80 ml/m LA Vol (A4C):   41.9 ml 22.22 ml/m LA Biplane Vol: 40.5 ml 21.48 ml/m  AORTIC VALVE LVOT Vmax:   74.90 cm/s LVOT Vmean:  50.300 cm/s LVOT VTI:    0.114 m  AORTA Ao Root diam: 4.00 cm MITRAL VALVE MV Area (PHT): 5.75 cm    SHUNTS MV Decel Time: 132 msec    Systemic VTI:  0.11 m MV E velocity: 82.80 cm/s  Systemic Diam: 2.10 cm MV A velocity: 29.30 cm/s MV E/A ratio:  2.83 Eleonore Chiquito MD Electronically signed by Eleonore Chiquito MD Signature Date/Time: 06/08/2022/3:48:18 PM    Final (Updated)      ASSESSMENT:  Metastatic cholangiocarcinoma: - 02/2021 initial diagnosis presentation with painless jaundice - ERCP with sphincterotomy, subsequent ERCP with plastic biliary stent placement. - MRI-2 cm mass involving the common hepatic duct with upstream biliary dilatation. - 03/2021 [facility] at [facility] with GI and surgery-confirm diagnosis of perihilar cholangiocarcinoma - 03/21/2021-biopsy/pathology, ERCP-common hepatic duct cytology and brushings performed.  EUS-1 enlarged lymph node versus tumor implant in the gastrohepatic ligament-negative for malignancy.  Single biliary plastic stent removed and replaced with the plastic stent in the right and left ducts. - 04/17/2021-right portal vein embolization to promote left hepatic hypertrophy. - 07/02/2021-diagnostic laparoscopy with multiple areas concerning for metastatic blocks identified in the abdomen and peritoneal surfaces.  6 separate biopsies obtained. - Pathology consistent with metastatic carcinoma. - 37 pound weight loss in the last 5 months. - Cycle 1 of gemcitabine, cisplatin, durvalumab on 07/22/2021.  2.  Social history/family: - Lives at home with his wife.  He worked as a Development worker, community. - Current active smoker, 1 pack/day since age 35. - Maternal grandmother had cancer.   Maternal uncle had brain tumor.   PLAN:  Metastatic cholangiocarcinoma: - CT CAP (06/04/2022): Unchanged appearance of liver lesion with no evidence of lymphadenopathy or metastatic disease  in the chest, abdomen or pelvis.  Unchanged small volume perihepatic ascites. - I have restarted back on Imfinzi only due to his LV thrombus.  He tolerated last cycle well. - Reviewed labs today which showed AST 53, alk phos 243.  Creatinine 1.27.  CBC shows platelet count 69.  White count is normal.  Hemoglobin 11.4. - Due to thrombocytopenia, I would not start chemotherapy.  Continue Imfinzi today.  RTC 4 weeks for follow-up.  2.  Weight loss: - He is eating well and weight is stable.  3.  Left ventricular thrombus (Dx 06/04/2022):: - Continue Coumadin daily.  Last INR 3.3.  4.  Hypotension: - He has been off of midodrine.  Blood pressure today is 152/83.   Orders placed this encounter:  No orders of the defined types were placed in this encounter.     Derek Jack, MD [place] [phone]

## 2022-07-08 NOTE — Patient Instructions (Signed)
[place] at [facility] Discharge Instructions   You were seen and examined today by Dr. Delton Coombes.  He reviewed the results of part of your blood work which is normal/stable. Blood count results are still pending.  We will hold chemotherapy today. We will just do the immunotherapy (Imfinzi) today.   Return as scheduled.    Thank you for choosing [place] at [facility] to provide your oncology and hematology care.  To afford each patient quality time with our provider, please arrive at least 15 minutes before your scheduled appointment time.   If you have a lab appointment with the [facility] please come in thru the Main Entrance and check in at the main information desk.  You need to re-schedule your appointment should you arrive 10 or more minutes late.  We strive to give you quality time with our providers, and arriving late affects you and other patients whose appointments are after yours.  Also, if you no show three or more times for appointments you may be dismissed from the clinic at the providers discretion.     Again, thank you for choosing [facility].  Our hope is that these requests will decrease the amount of time that you wait before being seen by our physicians.       _____________________________________________________________  Should you have questions after your visit to [facility], please contact our office at [phone] and follow the prompts.  Our office hours are 8:00 a.m. and 4:30 p.m. Monday - Friday.  Please note that voicemails left after 4:00 p.m. may not be returned until the following business day.  We are closed weekends and major holidays.  You do have access to a nurse 24-7, just call the main number to the clinic [phone] and do not press any options, hold on the line and a nurse will answer the phone.    For prescription refill requests, have your pharmacy contact our  office and allow 72 hours.    Due to Covid, you will need to wear a mask upon entering the hospital. If you do not have a mask, a mask will be given to you at the Main Entrance upon arrival. For doctor visits, patients may have 1 support person age 97 or older with them. For treatment visits, patients can not have anyone with them due to social distancing guidelines and our immunocompromised population.

## 2022-07-08 NOTE — Patient Instructions (Signed)
[facility]  Discharge Instructions: Thank you for choosing [facility] to provide your oncology and hematology care.  If you have a lab appointment with the Cancer Center, please come in thru the Main Entrance and check in at the main information desk.  Wear comfortable clothing and clothing appropriate for easy access to any Portacath or PICC line.   We strive to give you quality time with your provider. You may need to reschedule your appointment if you arrive late (15 or more minutes).  Arriving late affects you and other patients whose appointments are after yours.  Also, if you miss three or more appointments without notifying the office, you may be dismissed from the clinic at the provider's discretion.      For prescription refill requests, have your pharmacy contact our office and allow 72 hours for refills to be completed.    Today you received the following chemotherapy and/or immunotherapy agents Imfinzi   To help prevent nausea and vomiting after your treatment, we encourage you to take your nausea medication as directed.  Durvalumab Injection What is this medication? DURVALUMAB (dur VAL ue mab) treats some types of cancer. It works by helping your immune system slow or stop the spread of cancer cells. It is a monoclonal antibody. This medicine may be used for other purposes; ask your health care provider or pharmacist if you have questions. COMMON BRAND NAME(S): IMFINZI What should I tell my care team before I take this medication? They need to know if you have any of these conditions: Allogeneic stem cell transplant (uses someone else's stem cells) Autoimmune diseases, such as Crohn disease, ulcerative colitis, lupus History of chest radiation Nervous system problems, such as Guillain-Barre syndrome, myasthenia gravis Organ transplant An unusual or allergic reaction to durvalumab, other medications, foods, dyes, or preservatives Pregnant  or trying to get pregnant Breast-feeding How should I use this medication? This medication is infused into a vein. It is given by your care team in a hospital or clinic setting. A special MedGuide will be given to you before each treatment. Be sure to read this information carefully each time. Talk to your care team about the use of this medication in children. Special care may be needed. Overdosage: If you think you have taken too much of this medicine contact a poison control center or emergency room at once. NOTE: This medicine is only for you. Do not share this medicine with others. What if I miss a dose? Keep appointments for follow-up doses. It is important not to miss your dose. Call your care team if you are unable to keep an appointment. What may interact with this medication? Interactions have not been studied. This list may not describe all possible interactions. Give your health care provider a list of all the medicines, herbs, non-prescription drugs, or dietary supplements you use. Also tell them if you smoke, drink alcohol, or use illegal drugs. Some items may interact with your medicine. What should I watch for while using this medication? Your condition will be monitored carefully while you are receiving this medication. You may need blood work while taking this medication. This medication may cause serious skin reactions. They can happen weeks to months after starting the medication. Contact your care team right away if you notice fevers or flu-like symptoms with a rash. The rash may be red or purple and then turn into blisters or peeling of the skin. You may also notice a red rash with   swelling of the face, lips, or lymph nodes in your neck or under your arms. Tell your care team right away if you have any change in your eyesight. Talk to your care team if you may be pregnant. Serious birth defects can occur if you take this medication during pregnancy and for 3 months after the  last dose. You will need a negative pregnancy test before starting this medication. Contraception is recommended while taking this medication and for 3 months after the last dose. Your care team can help you find the option that works for you. Do not breastfeed while taking this medication and for 3 months after the last dose. What side effects may I notice from receiving this medication? Side effects that you should report to your care team as soon as possible: Allergic reactions--skin rash, itching, hives, swelling of the face, lips, tongue, or throat Dry cough, shortness of breath or trouble breathing Eye pain, redness, irritation, or discharge with blurry or decreased vision Heart muscle inflammation--unusual weakness or fatigue, shortness of breath, chest pain, fast or irregular heartbeat, dizziness, swelling of the ankles, feet, or hands Hormone gland problems--headache, sensitivity to light, unusual weakness or fatigue, dizziness, fast or irregular heartbeat, increased sensitivity to cold or heat, excessive sweating, constipation, hair loss, increased thirst or amount of urine, tremors or shaking, irritability Infusion reactions--chest pain, shortness of breath or trouble breathing, feeling faint or lightheaded Kidney injury (glomerulonephritis)--decrease in the amount of urine, red or dark brown urine, foamy or bubbly urine, swelling of the ankles, hands, or feet Liver injury--right upper belly pain, loss of appetite, nausea, light-colored stool, dark yellow or brown urine, yellowing skin or eyes, unusual weakness or fatigue Pain, tingling, or numbness in the hands or feet, muscle weakness, change in vision, confusion or trouble speaking, loss of balance or coordination, trouble walking, seizures Rash, fever, and swollen lymph nodes Redness, blistering, peeling, or loosening of the skin, including inside the mouth Sudden or severe stomach pain, bloody diarrhea, fever, nausea, vomiting Side  effects that usually do not require medical attention (report these to your care team if they continue or are bothersome): Bone, joint, or muscle pain Diarrhea Fatigue Loss of appetite Nausea Skin rash This list may not describe all possible side effects. Call your doctor for medical advice about side effects. You may report side effects to FDA at 1-800-FDA-1088. Where should I keep my medication? This medication is given in a hospital or clinic. It will not be stored at home. NOTE: This sheet is a summary. It may not cover all possible information. If you have questions about this medicine, talk to your doctor, pharmacist, or health care provider.  2023 Elsevier/Gold Standard (2021-11-17 00:00:00)   BELOW ARE SYMPTOMS THAT SHOULD BE REPORTED IMMEDIATELY: *FEVER GREATER THAN 100.4 F (38 C) OR HIGHER *CHILLS OR SWEATING *NAUSEA AND VOMITING THAT IS NOT CONTROLLED WITH YOUR NAUSEA MEDICATION *UNUSUAL SHORTNESS OF BREATH *UNUSUAL BRUISING OR BLEEDING *URINARY PROBLEMS (pain or burning when urinating, or frequent urination) *BOWEL PROBLEMS (unusual diarrhea, constipation, pain near the anus) TENDERNESS IN MOUTH AND THROAT WITH OR WITHOUT PRESENCE OF ULCERS (sore throat, sores in mouth, or a toothache) UNUSUAL RASH, SWELLING OR PAIN  UNUSUAL VAGINAL DISCHARGE OR ITCHING   Items with * indicate a potential emergency and should be followed up as soon as possible or go to the Emergency Department if any problems should occur.  Please show the CHEMOTHERAPY ALERT CARD or IMMUNOTHERAPY ALERT CARD at check-in to the Emergency Department and   triage nurse.  Should you have questions after your visit or need to cancel or reschedule your appointment, please contact [facility] [phone]  and follow the prompts.  Office hours are 8:00 a.m. to 4:30 p.m. Monday - Friday. Please note that voicemails left after 4:00 p.m. may not be returned until the following business day.  We  are closed weekends and major holidays. You have access to a nurse at all times for urgent questions. Please call the main number to the clinic [phone] and follow the prompts.  For any non-urgent questions, you may also contact your provider using MyChart. We now offer e-Visits for anyone 18 and older to request care online for non-urgent symptoms. For details visit [email or web address].   Also download the MyChart app! Go to the app store, search "MyChart", open the app, select [facility], and log in with your MyChart username and password.  Masks are optional in the cancer centers. If you would like for your care team to wear a mask while they are taking care of you, please let them know. You may have one support person who is at least 66 years old accompany you for your appointments.  

## 2022-07-08 NOTE — Progress Notes (Signed)
Pt presents today for Imfinzi, Gemzar, and Cisplatin per provider's order. Vital signs and labs WNL for treatment today. No Gemzar and Cisplatin will be given, pt will only receive Imfinzi today per Dr.K.  Imfinzi given today per MD orders. Tolerated infusion without adverse affects. Vital signs stable. No complaints at this time. Discharged from clinic via wheelchair in stable condition. Alert and oriented x 3. F/U with [facility] as scheduled.

## 2022-07-10 LAB — CANCER ANTIGEN 19-9: CA 19-9: 390 U/mL — ABNORMAL HIGH (ref 0–35)

## 2022-07-21 ENCOUNTER — Ambulatory Visit: Payer: Medicare Other | Attending: Cardiovascular Disease | Admitting: *Deleted

## 2022-07-21 DIAGNOSIS — Z5181 Encounter for therapeutic drug level monitoring: Secondary | ICD-10-CM | POA: Diagnosis not present

## 2022-07-21 DIAGNOSIS — I513 Intracardiac thrombosis, not elsewhere classified: Secondary | ICD-10-CM | POA: Diagnosis not present

## 2022-07-21 LAB — POCT INR: INR: 3.7 — AB (ref 2.0–3.0)

## 2022-07-21 NOTE — Patient Instructions (Signed)
Hold warfarin tonight then decrease dose 1 tablet daily except 1/2 tablet every Mondays, Wednesdays and Fridays Recheck INR in 2 wks

## 2022-07-22 ENCOUNTER — Ambulatory Visit: Payer: Medicare Other

## 2022-07-22 ENCOUNTER — Other Ambulatory Visit: Payer: Medicare Other

## 2022-08-05 ENCOUNTER — Inpatient Hospital Stay (HOSPITAL_BASED_OUTPATIENT_CLINIC_OR_DEPARTMENT_OTHER): Payer: Medicare Other | Admitting: Hematology

## 2022-08-05 ENCOUNTER — Inpatient Hospital Stay: Payer: Medicare Other

## 2022-08-05 ENCOUNTER — Inpatient Hospital Stay: Payer: Medicare Other | Attending: Hematology

## 2022-08-05 VITALS — BP 130/77 | HR 69 | Resp 16

## 2022-08-05 DIAGNOSIS — Z79899 Other long term (current) drug therapy: Secondary | ICD-10-CM | POA: Diagnosis not present

## 2022-08-05 DIAGNOSIS — C221 Intrahepatic bile duct carcinoma: Secondary | ICD-10-CM

## 2022-08-05 DIAGNOSIS — F17210 Nicotine dependence, cigarettes, uncomplicated: Secondary | ICD-10-CM | POA: Insufficient documentation

## 2022-08-05 DIAGNOSIS — Z7901 Long term (current) use of anticoagulants: Secondary | ICD-10-CM | POA: Insufficient documentation

## 2022-08-05 DIAGNOSIS — Z95828 Presence of other vascular implants and grafts: Secondary | ICD-10-CM | POA: Diagnosis not present

## 2022-08-05 DIAGNOSIS — E785 Hyperlipidemia, unspecified: Secondary | ICD-10-CM | POA: Diagnosis not present

## 2022-08-05 DIAGNOSIS — Z5112 Encounter for antineoplastic immunotherapy: Secondary | ICD-10-CM | POA: Insufficient documentation

## 2022-08-05 DIAGNOSIS — Z7984 Long term (current) use of oral hypoglycemic drugs: Secondary | ICD-10-CM | POA: Diagnosis not present

## 2022-08-05 DIAGNOSIS — E119 Type 2 diabetes mellitus without complications: Secondary | ICD-10-CM | POA: Diagnosis not present

## 2022-08-05 DIAGNOSIS — I509 Heart failure, unspecified: Secondary | ICD-10-CM | POA: Insufficient documentation

## 2022-08-05 DIAGNOSIS — I110 Hypertensive heart disease with heart failure: Secondary | ICD-10-CM | POA: Diagnosis not present

## 2022-08-05 LAB — CBC WITH DIFFERENTIAL/PLATELET
Abs Immature Granulocytes: 0.01 10*3/uL (ref 0.00–0.07)
Basophils Absolute: 0 10*3/uL (ref 0.0–0.1)
Basophils Relative: 0 %
Eosinophils Absolute: 0.2 10*3/uL (ref 0.0–0.5)
Eosinophils Relative: 5 %
HCT: 32.9 % — ABNORMAL LOW (ref 39.0–52.0)
Hemoglobin: 11.2 g/dL — ABNORMAL LOW (ref 13.0–17.0)
Immature Granulocytes: 0 %
Lymphocytes Relative: 21 %
Lymphs Abs: 1 10*3/uL (ref 0.7–4.0)
MCH: 33.5 pg (ref 26.0–34.0)
MCHC: 34 g/dL (ref 30.0–36.0)
MCV: 98.5 fL (ref 80.0–100.0)
Monocytes Absolute: 0.6 10*3/uL (ref 0.1–1.0)
Monocytes Relative: 13 %
Neutro Abs: 2.9 10*3/uL (ref 1.7–7.7)
Neutrophils Relative %: 61 %
Platelets: 76 10*3/uL — ABNORMAL LOW (ref 150–400)
RBC: 3.34 MIL/uL — ABNORMAL LOW (ref 4.22–5.81)
RDW: 13.9 % (ref 11.5–15.5)
WBC: 4.8 10*3/uL (ref 4.0–10.5)
nRBC: 0 % (ref 0.0–0.2)

## 2022-08-05 LAB — COMPREHENSIVE METABOLIC PANEL
ALT: 47 U/L — ABNORMAL HIGH (ref 0–44)
AST: 65 U/L — ABNORMAL HIGH (ref 15–41)
Albumin: 2.7 g/dL — ABNORMAL LOW (ref 3.5–5.0)
Alkaline Phosphatase: 252 U/L — ABNORMAL HIGH (ref 38–126)
Anion gap: 7 (ref 5–15)
BUN: 12 mg/dL (ref 8–23)
CO2: 23 mmol/L (ref 22–32)
Calcium: 8.3 mg/dL — ABNORMAL LOW (ref 8.9–10.3)
Chloride: 103 mmol/L (ref 98–111)
Creatinine, Ser: 1.25 mg/dL — ABNORMAL HIGH (ref 0.61–1.24)
GFR, Estimated: >60 mL/min (ref >60)
Glucose, Bld: 144 mg/dL — ABNORMAL HIGH (ref 70–99)
Potassium: 4.3 mmol/L (ref 3.5–5.1)
Sodium: 133 mmol/L — ABNORMAL LOW (ref 135–145)
Total Bilirubin: 1.3 mg/dL — ABNORMAL HIGH (ref 0.3–1.2)
Total Protein: 6.5 g/dL (ref 6.5–8.1)

## 2022-08-05 LAB — MAGNESIUM: Magnesium: 1.9 mg/dL (ref 1.7–2.4)

## 2022-08-05 MED ORDER — SODIUM CHLORIDE 0.9 % IV SOLN: Freq: Once | INTRAVENOUS | Status: AC

## 2022-08-05 MED ORDER — SODIUM CHLORIDE 0.9% FLUSH
10.0000 mL | INTRAVENOUS | Status: DC | PRN
  Administered 2022-08-05: 10 mL

## 2022-08-05 MED ORDER — SODIUM CHLORIDE 0.9% FLUSH
10.0000 mL | Freq: Once | INTRAVENOUS | Status: AC
  Administered 2022-08-05: 10 mL via INTRAVENOUS

## 2022-08-05 MED ORDER — SODIUM CHLORIDE 0.9 % IV SOLN
1500.0000 mg | Freq: Once | INTRAVENOUS | Status: AC
  Administered 2022-08-05: 1500 mg via INTRAVENOUS
  Filled 2022-08-05: qty 30

## 2022-08-05 MED ORDER — HEPARIN SOD (PORK) LOCK FLUSH 100 UNIT/ML IV SOLN
500.0000 [IU] | Freq: Once | INTRAVENOUS | Status: AC | PRN
  Administered 2022-08-05: 500 [IU]

## 2022-08-05 NOTE — Patient Instructions (Addendum)
[place] at [facility] Discharge Instructions   You were seen and examined today by Dr. Delton Coombes.  He reviewed part of your lab work which is normal/stable. Your blood counts and platelets are pending.   We will repeat a scan prior to your next visit.    Thank you for choosing [place] at [facility] to provide your oncology and hematology care.  To afford each patient quality time with our provider, please arrive at least 15 minutes before your scheduled appointment time.   If you have a lab appointment with the [facility] please come in thru the Main Entrance and check in at the main information desk.  You need to re-schedule your appointment should you arrive 10 or more minutes late.  We strive to give you quality time with our providers, and arriving late affects you and other patients whose appointments are after yours.  Also, if you no show three or more times for appointments you may be dismissed from the clinic at the providers discretion.     Again, thank you for choosing [facility].  Our hope is that these requests will decrease the amount of time that you wait before being seen by our physicians.       _____________________________________________________________  Should you have questions after your visit to [facility], please contact our office at [phone] and follow the prompts.  Our office hours are 8:00 a.m. and 4:30 p.m. Monday - Friday.  Please note that voicemails left after 4:00 p.m. may not be returned until the following business day.  We are closed weekends and major holidays.  You do have access to a nurse 24-7, just call the main number to the clinic [phone] and do not press any options, hold on the line and a nurse will answer the phone.    For prescription refill requests, have your pharmacy contact our office and allow 72 hours.    Due to Covid, you will need to wear a  mask upon entering the hospital. If you do not have a mask, a mask will be given to you at the Main Entrance upon arrival. For doctor visits, patients may have 1 support person age 66 or older with them. For treatment visits, patients can not have anyone with them due to social distancing guidelines and our immunocompromised population.

## 2022-08-05 NOTE — Patient Instructions (Signed)
[facility]  Discharge Instructions: Thank you for choosing [facility] to provide your oncology and hematology care.  If you have a lab appointment with the Cancer Center, please come in thru the Main Entrance and check in at the main information desk.  Wear comfortable clothing and clothing appropriate for easy access to any Portacath or PICC line.   We strive to give you quality time with your provider. You may need to reschedule your appointment if you arrive late (15 or more minutes).  Arriving late affects you and other patients whose appointments are after yours.  Also, if you miss three or more appointments without notifying the office, you may be dismissed from the clinic at the provider's discretion.      For prescription refill requests, have your pharmacy contact our office and allow 72 hours for refills to be completed.    Today you received the following chemotherapy and/or immunotherapy agents Imfinzi   To help prevent nausea and vomiting after your treatment, we encourage you to take your nausea medication as directed.  Durvalumab Injection What is this medication? DURVALUMAB (dur VAL ue mab) treats some types of cancer. It works by helping your immune system slow or stop the spread of cancer cells. It is a monoclonal antibody. This medicine may be used for other purposes; ask your health care provider or pharmacist if you have questions. COMMON BRAND NAME(S): IMFINZI What should I tell my care team before I take this medication? They need to know if you have any of these conditions: Allogeneic stem cell transplant (uses someone else's stem cells) Autoimmune diseases, such as Crohn disease, ulcerative colitis, lupus History of chest radiation Nervous system problems, such as Guillain-Barre syndrome, myasthenia gravis Organ transplant An unusual or allergic reaction to durvalumab, other medications, foods, dyes, or preservatives Pregnant  or trying to get pregnant Breast-feeding How should I use this medication? This medication is infused into a vein. It is given by your care team in a hospital or clinic setting. A special MedGuide will be given to you before each treatment. Be sure to read this information carefully each time. Talk to your care team about the use of this medication in children. Special care may be needed. Overdosage: If you think you have taken too much of this medicine contact a poison control center or emergency room at once. NOTE: This medicine is only for you. Do not share this medicine with others. What if I miss a dose? Keep appointments for follow-up doses. It is important not to miss your dose. Call your care team if you are unable to keep an appointment. What may interact with this medication? Interactions have not been studied. This list may not describe all possible interactions. Give your health care provider a list of all the medicines, herbs, non-prescription drugs, or dietary supplements you use. Also tell them if you smoke, drink alcohol, or use illegal drugs. Some items may interact with your medicine. What should I watch for while using this medication? Your condition will be monitored carefully while you are receiving this medication. You may need blood work while taking this medication. This medication may cause serious skin reactions. They can happen weeks to months after starting the medication. Contact your care team right away if you notice fevers or flu-like symptoms with a rash. The rash may be red or purple and then turn into blisters or peeling of the skin. You may also notice a red rash with   swelling of the face, lips, or lymph nodes in your neck or under your arms. Tell your care team right away if you have any change in your eyesight. Talk to your care team if you may be pregnant. Serious birth defects can occur if you take this medication during pregnancy and for 3 months after the  last dose. You will need a negative pregnancy test before starting this medication. Contraception is recommended while taking this medication and for 3 months after the last dose. Your care team can help you find the option that works for you. Do not breastfeed while taking this medication and for 3 months after the last dose. What side effects may I notice from receiving this medication? Side effects that you should report to your care team as soon as possible: Allergic reactions--skin rash, itching, hives, swelling of the face, lips, tongue, or throat Dry cough, shortness of breath or trouble breathing Eye pain, redness, irritation, or discharge with blurry or decreased vision Heart muscle inflammation--unusual weakness or fatigue, shortness of breath, chest pain, fast or irregular heartbeat, dizziness, swelling of the ankles, feet, or hands Hormone gland problems--headache, sensitivity to light, unusual weakness or fatigue, dizziness, fast or irregular heartbeat, increased sensitivity to cold or heat, excessive sweating, constipation, hair loss, increased thirst or amount of urine, tremors or shaking, irritability Infusion reactions--chest pain, shortness of breath or trouble breathing, feeling faint or lightheaded Kidney injury (glomerulonephritis)--decrease in the amount of urine, red or dark brown urine, foamy or bubbly urine, swelling of the ankles, hands, or feet Liver injury--right upper belly pain, loss of appetite, nausea, light-colored stool, dark yellow or brown urine, yellowing skin or eyes, unusual weakness or fatigue Pain, tingling, or numbness in the hands or feet, muscle weakness, change in vision, confusion or trouble speaking, loss of balance or coordination, trouble walking, seizures Rash, fever, and swollen lymph nodes Redness, blistering, peeling, or loosening of the skin, including inside the mouth Sudden or severe stomach pain, bloody diarrhea, fever, nausea, vomiting Side  effects that usually do not require medical attention (report these to your care team if they continue or are bothersome): Bone, joint, or muscle pain Diarrhea Fatigue Loss of appetite Nausea Skin rash This list may not describe all possible side effects. Call your doctor for medical advice about side effects. You may report side effects to FDA at 1-800-FDA-1088. Where should I keep my medication? This medication is given in a hospital or clinic. It will not be stored at home. NOTE: This sheet is a summary. It may not cover all possible information. If you have questions about this medicine, talk to your doctor, pharmacist, or health care provider.  2023 Elsevier/Gold Standard (2021-11-17 00:00:00)  BELOW ARE SYMPTOMS THAT SHOULD BE REPORTED IMMEDIATELY: *FEVER GREATER THAN 100.4 F (38 C) OR HIGHER *CHILLS OR SWEATING *NAUSEA AND VOMITING THAT IS NOT CONTROLLED WITH YOUR NAUSEA MEDICATION *UNUSUAL SHORTNESS OF BREATH *UNUSUAL BRUISING OR BLEEDING *URINARY PROBLEMS (pain or burning when urinating, or frequent urination) *BOWEL PROBLEMS (unusual diarrhea, constipation, pain near the anus) TENDERNESS IN MOUTH AND THROAT WITH OR WITHOUT PRESENCE OF ULCERS (sore throat, sores in mouth, or a toothache) UNUSUAL RASH, SWELLING OR PAIN  UNUSUAL VAGINAL DISCHARGE OR ITCHING   Items with * indicate a potential emergency and should be followed up as soon as possible or go to the Emergency Department if any problems should occur.  Please show the CHEMOTHERAPY ALERT CARD or IMMUNOTHERAPY ALERT CARD at check-in to the Emergency Department and triage   nurse.  Should you have questions after your visit or need to cancel or reschedule your appointment, please contact [facility] [phone]  and follow the prompts.  Office hours are 8:00 a.m. to 4:30 p.m. Monday - Friday. Please note that voicemails left after 4:00 p.m. may not be returned until the following business day.  We are  closed weekends and major holidays. You have access to a nurse at all times for urgent questions. Please call the main number to the clinic [phone] and follow the prompts.  For any non-urgent questions, you may also contact your provider using MyChart. We now offer e-Visits for anyone 18 and older to request care online for non-urgent symptoms. For details visit [email or web address].   Also download the MyChart app! Go to the app store, search "MyChart", open the app, select [place], and log in with your MyChart username and password.   

## 2022-08-05 NOTE — Progress Notes (Signed)
Pt presents today for Imfinzi per provider's order. Vital signs and other labs WNL for treatment. Pt's platelets are 76 today. Dr.K made aware and stated pt is okay to proceed with treatment.  Imfinzi given today per MD orders. Tolerated infusion without adverse affects. Vital signs stable. No complaints at this time. Discharged from clinic via wheelchair in stable condition. Alert and oriented x 3. F/U with [facility] as scheduled.

## 2022-08-05 NOTE — Progress Notes (Signed)
[place], [place] [id number]   CLINIC:  Medical Oncology/Hematology  PCP:  Redmond School, [place] / [place] [address] [phone]   REASON FOR VISIT:  Follow-up for intrahepatic cholangiocarcinoma  PRIOR THERAPY: none  NGS Results: not done  CURRENT THERAPY: Cisplatin + Gemcitabine D1, D15 q 28d  BRIEF ONCOLOGIC HISTORY:  Oncology History  Cholangiocarcinoma ([place])  02/28/2021 Initial Diagnosis   Cholangiocarcinoma ([place])   07/17/2021 Cancer Staging   Staging form: Perihilar Bile Ducts, AJCC 8th Edition - Clinical stage from 07/17/2021: Stage IVB (cTX, cNX, pM1) - Signed by Derek Jack, MD on 07/17/2021 Stage prefix: Initial diagnosis   07/22/2021 - 04/01/2022 Chemotherapy   Patient is on Treatment Plan : BILIARY TRACT Cisplatin + Gemcitabine D1,15 + Imfinzi day 1 q28d     07/22/2021 -  Chemotherapy   Patient is on Treatment Plan : BILIARY TRACT IMFINZI D1  +  Cisplatin + Gemcitabine  D1, 15 q28d       CANCER STAGING:  Cancer Staging  Cholangiocarcinoma ([place]) Staging form: Perihilar Bile Ducts, AJCC 8th Edition - Clinical stage from 07/17/2021: Stage IVB (cTX, cNX, pM1) - Signed by Derek Jack, MD on 07/17/2021   INTERVAL HISTORY:  Mr. LASZLO Keith, a 66 y.o. male, seen for follow-up of cholangiocarcinoma and toxicity assessment prior to next cycle of immunotherapy.  As his platelets were remaining low, we have been giving him immunotherapy as maintenance.  He is having problems with hematuria and is seeing urology on 08/21/2021.  Denied any immunotherapy related side effects.  No chest pains reported.  Energy levels are 70%.  REVIEW OF SYSTEMS:  Review of Systems  Gastrointestinal:  Negative for diarrhea.  Genitourinary:  Positive for hematuria.   Skin:  Negative for rash.  Neurological:  Negative for numbness.  Psychiatric/Behavioral:  Positive for sleep disturbance.   All other systems  reviewed and are negative.   PAST MEDICAL/SURGICAL HISTORY:  Past Medical History:  Diagnosis Date   Cancer ([place] [facility])    CHF (congestive heart failure) (HCC)    Chronic pain    neck, back, knees   Class 1 obesity due to excess calories with body mass index (BMI) of 31.0 to 31.9 in adult    Claudication [facility])    Coronary artery disease    DDD (degenerative disc disease), cervical    Diabetes mellitus ([place])    Dyslipidemia    Hypertension    Port-A-Cath in place 07/20/2021   Tobacco dependence    Past Surgical History:  Procedure Laterality Date   BILIARY BRUSHING N/A 02/25/2021   Procedure: BILIARY BRUSHING;  Surgeon: Rogene Houston, MD;  Location: [facility];  Service: Gastroenterology;  Laterality: N/A;   BILIARY STENT PLACEMENT N/A 02/25/2021   Procedure: BILIARY STENT PLACEMENT;  Surgeon: Rogene Houston, MD;  Location: [facility];  Service: Gastroenterology;  Laterality: N/A;   BILIARY STENT PLACEMENT  02/27/2021   Procedure: BILIARY STENT PLACEMENT (10FR x 9cm) IN THE RIGHT SYSTEM;  Surgeon: Rogene Houston, MD;  Location: [facility];  Service: Gastroenterology;;   BREAST SURGERY Left    benign lump- in his 45s.   CARDIAC CATHETERIZATION     ERCP N/A 02/25/2021   Procedure: ENDOSCOPIC RETROGRADE CHOLANGIOPANCREATOGRAPHY (ERCP);  Surgeon: Rogene Houston, MD;  Location: [facility];  Service: Gastroenterology;  Laterality: N/A;   ERCP N/A 02/27/2021   Procedure: ENDOSCOPIC RETROGRADE CHOLANGIOPANCREATOGRAPHY (ERCP);  Surgeon: Rogene Houston, MD;  Location: [facility];  Service: Gastroenterology;  Laterality: N/A;   IR IMAGING GUIDED PORT INSERTION  07/21/2021   KNEE SURGERY Left    x2   LEFT HEART CATH AND CORONARY ANGIOGRAPHY N/A 09/08/2021   Procedure: LEFT HEART CATH AND CORONARY ANGIOGRAPHY;  Surgeon: Early Osmond, MD;  Location: [place] CV LAB;  Service: Cardiovascular;  Laterality: N/A;   SPHINCTEROTOMY N/A 02/25/2021   Procedure: SPHINCTEROTOMY;  Surgeon: Rogene Houston,  MD;  Location: [facility];  Service: Gastroenterology;  Laterality: N/A;   STENT REMOVAL  02/27/2021   Procedure: STENT REMOVAL (8.5Fr x 9cm);  Surgeon: Rogene Houston, MD;  Location: [facility];  Service: Gastroenterology;;    SOCIAL HISTORY:  Social History   Socioeconomic History   Marital status: Married    Spouse name: Not on file   Number of children: Not on file   Years of education: Not on file   Highest education level: Not on file  Occupational History   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  Tobacco Use   Smoking status: Every Day    Packs/day: 0.50    Years: 56.00    Total pack years: 28.00    Types: Cigarettes   Smokeless tobacco: Never  Vaping Use   Vaping Use: Never used  Substance and Sexual Activity   Alcohol use: Not Currently    Comment: stopped 2.5 years ago 03/11/21   Drug use: Never   Sexual activity: Not on file  Other Topics Concern   Not on file  Social History Narrative   Not on file   Social Determinants of Health   Financial Resource Strain: Medium Risk (03/07/2021)   Overall Financial Resource Strain (CARDIA)    Difficulty of Paying Living Expenses: Somewhat hard  Food Insecurity: No Food Insecurity (03/07/2021)   Hunger Vital Sign    Worried About Running Out of Food in the Last Year: Never true    [place] in the Last Year: Never true  Transportation Needs: No Transportation Needs (03/07/2021)   PRAPARE - Hydrologist (Medical): No    Lack of Transportation (Non-Medical): No  Physical Activity: Insufficiently Active (03/07/2021)   Exercise Vital Sign    Days of Exercise per Week: 2 days    Minutes of Exercise per Session: 20 min  Stress: No Stress Concern Present (03/07/2021)   [place]    Feeling of Stress : Only a little  Social Connections: Moderately Integrated (03/07/2021)   Social Connection and Isolation Panel [NHANES]    Frequency of  Communication with Friends and Family: More than three times a week    Frequency of Social Gatherings with Friends and Family: More than three times a week    Attends Religious Services: More than 4 times per year    Active Member of [employer] or Organizations: No    Attends Archivist Meetings: Never    Marital Status: Married  Human resources officer Violence: Not At Risk (03/07/2021)   Humiliation, Afraid, Rape, and Kick questionnaire    Fear of Current or Ex-Partner: No    Emotionally Abused: No    Physically Abused: No    Sexually Abused: No    FAMILY HISTORY:  Family History  Problem Relation Age of Onset   CAD Mother    Cancer Neg Hx     CURRENT MEDICATIONS:  Current Outpatient Medications  Medication Sig Dispense Refill   acetaminophen (TYLENOL) 500 MG tablet Take 500  mg by mouth every 6 (six) hours as needed for moderate pain or headache.     albuterol (VENTOLIN HFA) 108 (90 Base) MCG/ACT inhaler Inhale 2 puffs into the lungs every 6 (six) hours as needed for wheezing or shortness of breath. 6.7 g 1   atorvastatin (LIPITOR) 40 MG tablet Take 1 tablet (40 mg total) by mouth daily at 6 PM. 90 tablet 3   carvedilol (COREG) 6.25 MG tablet Take 1 tablet (6.25 mg total) by mouth 2 (two) times daily. 180 tablet 3   cephALEXin (KEFLEX) 500 MG capsule Take 500 mg by mouth every 6 (six) hours.     CISPLATIN IV Inject into the vein once a week. Days 1, 8 every 21 days     Durvalumab (IMFINZI IV) Inject into the vein every 21 ( twenty-one) days.     enoxaparin (LOVENOX) 100 MG/ML injection Inject 1 mL (100 mg total) into the skin daily for 10 days. 10 mL 0   famotidine (PEPCID) 20 MG tablet Take 20 mg by mouth 2 (two) times daily.     ferrous sulfate (FERROUSUL) 325 (65 FE) MG tablet Take 1 tablet (325 mg total) by mouth daily with breakfast.     Gemcitabine HCl (GEMZAR IV) Inject into the vein once a week. Days 1, 8 every 21 days     Ginseng 100 MG CAPS Take 1 capsule by mouth daily.      lidocaine-prilocaine (EMLA) cream      loratadine (CLARITIN) 10 MG tablet Take 10 mg by mouth daily. Takes for a few days after shot to boost WBC. About every 2nd treatment     metFORMIN (GLUCOPHAGE) 500 MG tablet Take 1 tablet (500 mg total) by mouth 2 (two) times daily with a meal. 60 tablet 0   nitroGLYCERIN (NITROSTAT) 0.4 MG SL tablet Place 1 tablet (0.4 mg total) under the tongue every 5 (five) minutes as needed for chest pain. 25 tablet 3   pantoprazole (PROTONIX) 40 MG tablet Take 1 tablet (40 mg total) by mouth daily. 90 tablet 3   prochlorperazine (COMPAZINE) 10 MG tablet TAKE (1) TABLET BY MOUTH EVERY (6) HOURS AS NEEDED. 90 tablet 3   tamsulosin (FLOMAX) 0.4 MG CAPS capsule TAKE (2) CAPSULES BY MOUTH AT BEDTIME. 60 capsule 6   Vitamin D, Ergocalciferol, (DRISDOL) 1.25 MG (50000 UNIT) CAPS capsule Take 1 capsule by mouth once a week.     warfarin (COUMADIN) 5 MG tablet Take 1 tablet (5 mg total) by mouth daily. 45 tablet 3   No current facility-administered medications for this visit.    ALLERGIES:  No Known Allergies  PHYSICAL EXAM:  Performance status (ECOG): 0 - Asymptomatic  There were no vitals filed for this visit. Wt Readings from Last 3 Encounters:  08/05/22 158 lb 4.6 oz (71.8 kg)  07/08/22 155 lb 9.6 oz (70.6 kg)  06/12/22 156 lb (70.8 kg)   Physical Exam Vitals reviewed.  Constitutional:      Appearance: Normal appearance.  Cardiovascular:     Rate and Rhythm: Normal rate and regular rhythm.     Pulses: Normal pulses.     Heart sounds: Normal heart sounds.  Pulmonary:     Effort: Pulmonary effort is normal.     Breath sounds: Normal breath sounds.  Abdominal:     Palpations: Abdomen is soft. There is no mass.     Tenderness: There is no abdominal tenderness.  Musculoskeletal:     Right lower leg: No edema.  Left lower leg: No edema.  Neurological:     General: No focal deficit present.     Mental Status: He is alert and oriented to person,  place, and time.  Psychiatric:        Mood and Affect: Mood normal.        Behavior: Behavior normal.    LABORATORY DATA:  I have reviewed the labs as listed.     Latest Ref Rng & Units 07/08/2022    7:50 AM 06/10/2022    7:59 AM 06/08/2022    3:51 PM  CBC  WBC 4.0 - 10.5 K/uL 5.9  4.2  5.8   Hemoglobin 13.0 - 17.0 g/dL 11.4  10.8  10.6   Hematocrit 39.0 - 52.0 % 33.9  32.3  30.8   Platelets 150 - 400 K/uL 69  90  71       Latest Ref Rng & Units 07/08/2022    7:50 AM 06/10/2022    7:59 AM 06/08/2022    3:51 PM  CMP  Glucose 70 - 99 mg/dL 189  186  146   BUN 8 - 23 mg/dL _0 Creatinine 0.61 - 1.24 mg/dL 1.27  1.16  1.15   Sodium 135 - 145 mmol/L 132  132  131   Potassium 3.5 - 5.1 mmol/L 4.3  3.9  3.9   Chloride 98 - 111 mmol/L 101  102  99   CO2 22 - 32 mmol/L _1 Calcium 8.9 - 10.3 mg/dL 8.7  8.5  8.9   Total Protein 6.5 - 8.1 g/dL 6.8  6.7  6.5   Total Bilirubin 0.3 - 1.2 mg/dL 1.2  1.6  1.5   Alkaline Phos 38 - 126 U/L 243  240  263   AST 15 - 41 U/L 53  49  41   ALT 0 - 44 U/L 40  40  37     DIAGNOSTIC IMAGING:  I have independently reviewed the scans and discussed with the patient. No results found.   ASSESSMENT:  Metastatic cholangiocarcinoma: - 02/2021 initial diagnosis presentation with painless jaundice - ERCP with sphincterotomy, subsequent ERCP with plastic biliary stent placement. - MRI-2 cm mass involving the common hepatic duct with upstream biliary dilatation. - 03/2021 consult at [facility] with GI and surgery-confirm diagnosis of perihilar cholangiocarcinoma - 03/21/2021-biopsy/pathology, ERCP-common hepatic duct cytology and brushings performed.  EUS-1 enlarged lymph node versus tumor implant in the gastrohepatic ligament-negative for malignancy.  Single biliary plastic stent removed and replaced with the plastic stent in the right and left ducts. - 04/17/2021-right portal vein embolization to promote left hepatic hypertrophy. -  07/02/2021-diagnostic laparoscopy with multiple areas concerning for metastatic blocks identified in the abdomen and peritoneal surfaces.  6 separate biopsies obtained. - Pathology consistent with metastatic carcinoma. - 37 pound weight loss in the last 5 months. - Cycle 1 of gemcitabine, cisplatin, durvalumab on 07/22/2021.  2.  Social history/family: - Lives at home with his wife.  He worked as a Development worker, community. - Current active smoker, 1 pack/day since age 63. - Maternal grandmother had cancer.  Maternal uncle had brain tumor.   PLAN:  Metastatic cholangiocarcinoma: - CT CAP on 06/04/2022 showed unchanged appearance of liver lesion with no evidence of lymphadenopathy or metastatic disease in the chest, abdomen or pelvis.  Unchanged small volume perihepatic ascites. - Reviewed labs today which showed elevated alk phos, AST and ALT.  Creatinine is 1.25 and  stable.  CBC shows thrombocytopenia with platelet count 76. - He does not report any immunotherapy related adverse effects. - Proceed with durvalumab today.  RTC 4 weeks.  Plan to repeat CT CAP prior to next visit.  2.  Weight loss: - He is eating well and continuing to gain weight.  3.  Left ventricular thrombus (Dx 06/04/2022):: - Continue Coumadin daily.  Last INR 3.7.  4.  Hypotension: - He has been off of midodrine and blood pressures are staying good at 124/77.   Orders placed this encounter:  No orders of the defined types were placed in this encounter.     Derek Jack, MD [place] [phone]

## 2022-08-06 ENCOUNTER — Ambulatory Visit: Payer: Medicare Other | Attending: Cardiovascular Disease | Admitting: *Deleted

## 2022-08-06 DIAGNOSIS — Z5181 Encounter for therapeutic drug level monitoring: Secondary | ICD-10-CM

## 2022-08-06 DIAGNOSIS — I513 Intracardiac thrombosis, not elsewhere classified: Secondary | ICD-10-CM

## 2022-08-06 LAB — CANCER ANTIGEN 19-9: CA 19-9: 557 U/mL — ABNORMAL HIGH (ref 0–35)

## 2022-08-06 LAB — POCT INR: INR: 2.2 (ref 2.0–3.0)

## 2022-08-06 NOTE — Patient Instructions (Signed)
Continue 1 tablet daily except 1/2 tablet every Mondays, Wednesdays and Fridays Recheck INR in 2 wks

## 2022-08-19 ENCOUNTER — Ambulatory Visit: Payer: Medicare Other | Admitting: Hematology

## 2022-08-19 ENCOUNTER — Ambulatory Visit: Payer: Medicare Other

## 2022-08-19 ENCOUNTER — Ambulatory Visit: Payer: Medicare Other | Attending: Cardiovascular Disease | Admitting: *Deleted

## 2022-08-19 ENCOUNTER — Other Ambulatory Visit: Payer: Medicare Other

## 2022-08-19 DIAGNOSIS — Z5181 Encounter for therapeutic drug level monitoring: Secondary | ICD-10-CM

## 2022-08-19 DIAGNOSIS — I513 Intracardiac thrombosis, not elsewhere classified: Secondary | ICD-10-CM | POA: Diagnosis not present

## 2022-08-19 LAB — POCT INR: INR: 3.2 — AB (ref 2.0–3.0)

## 2022-08-19 NOTE — Patient Instructions (Signed)
Decrease warfarin to 1/2 tablet daily except 1 tablet on Sundays, Tuesdays and Thursdays Recheck INR in 2 wks

## 2022-08-21 ENCOUNTER — Encounter: Payer: Self-pay | Admitting: Urology

## 2022-08-21 ENCOUNTER — Ambulatory Visit (INDEPENDENT_AMBULATORY_CARE_PROVIDER_SITE_OTHER): Payer: Medicare Other | Admitting: Urology

## 2022-08-21 VITALS — BP 135/75 | HR 92

## 2022-08-21 DIAGNOSIS — R351 Nocturia: Secondary | ICD-10-CM | POA: Diagnosis not present

## 2022-08-21 DIAGNOSIS — N138 Other obstructive and reflux uropathy: Secondary | ICD-10-CM

## 2022-08-21 DIAGNOSIS — R310 Gross hematuria: Secondary | ICD-10-CM

## 2022-08-21 DIAGNOSIS — N401 Enlarged prostate with lower urinary tract symptoms: Secondary | ICD-10-CM | POA: Diagnosis not present

## 2022-08-21 LAB — URINALYSIS, ROUTINE W REFLEX MICROSCOPIC
Bilirubin, UA: NEGATIVE
Glucose, UA: NEGATIVE
Ketones, UA: NEGATIVE
Leukocytes,UA: NEGATIVE
Nitrite, UA: NEGATIVE
Specific Gravity, UA: 1.015 (ref 1.005–1.030)
Urobilinogen, Ur: 1 mg/dL (ref 0.2–1.0)
pH, UA: 5.5 (ref 5.0–7.5)

## 2022-08-21 LAB — MICROSCOPIC EXAMINATION
Bacteria, UA: NONE SEEN
Epithelial Cells (non renal): NONE SEEN /hpf (ref 0–10)
WBC, UA: NONE SEEN /hpf (ref 0–5)

## 2022-08-21 MED ORDER — ALFUZOSIN HCL ER 10 MG PO TB24: 10.0000 mg | ORAL_TABLET | Freq: Every evening | ORAL | 11 refills | Status: DC

## 2022-08-21 NOTE — Patient Instructions (Signed)

## 2022-08-21 NOTE — Progress Notes (Unsigned)
post void residual=124

## 2022-08-21 NOTE — Progress Notes (Unsigned)
08/21/2022 12:47 PM   Jonathan Keith [birth year] [id number]  Referring provider: Redmond School, MD [address] [place],  [place] [address]  Microhematuria and difficulty urinating   HPI: Jonathan Keith is a [phone] here for evaluation of microhemturia and difficulty urinating. IPSS 20 QOL 3. He is currently on flomax 0.'4mg'$  daily. CT 05/2022 showed a 37m bladder calculus. He has a 50pk year smoking history.    PMH: Past Medical History:  Diagnosis Date   Cancer ([place]    CHF (congestive heart failure) (HCC)    Chronic pain    neck, back, knees   Class 1 obesity due to excess calories with body mass index (BMI) of 31.0 to 31.9 in adult    Claudication ([facility]    Coronary artery disease    DDD (degenerative disc disease), cervical    Diabetes mellitus ([place]    Dyslipidemia    Hypertension    Port-A-Cath in place 07/20/2021   Tobacco dependence     Surgical History: Past Surgical History:  Procedure Laterality Date   BILIARY BRUSHING N/A 02/25/2021   Procedure: BILIARY BRUSHING;  Surgeon: RRogene Houston MD;  [facility]: [facility];  Service: Gastroenterology;  Laterality: N/A;   BILIARY STENT PLACEMENT N/A 02/25/2021   Procedure: BILIARY STENT PLACEMENT;  Surgeon: RRogene Houston MD;  Location: [facility];  Service: Gastroenterology;  Laterality: N/A;   BILIARY STENT PLACEMENT  02/27/2021   Procedure: BILIARY STENT PLACEMENT (10FR x 9cm) IN THE RIGHT SYSTEM;  Surgeon: RRogene Houston MD;  Location: [facility];  Service: Gastroenterology;;   BREAST SURGERY Left    benign lump- in his 262s   CARDIAC CATHETERIZATION     ERCP N/A 02/25/2021   Procedure: ENDOSCOPIC RETROGRADE CHOLANGIOPANCREATOGRAPHY (ERCP);  Surgeon: RRogene Houston MD;  Location: [facility];  Service: Gastroenterology;  Laterality: N/A;   ERCP N/A 02/27/2021   Procedure: ENDOSCOPIC RETROGRADE CHOLANGIOPANCREATOGRAPHY (ERCP);  Surgeon: RRogene Houston MD;  Location: [facility];  Service: Gastroenterology;  Laterality:  N/A;   IR IMAGING GUIDED PORT INSERTION  07/21/2021   KNEE SURGERY Left    x2   LEFT HEART CATH AND CORONARY ANGIOGRAPHY N/A 09/08/2021   Procedure: LEFT HEART CATH AND CORONARY ANGIOGRAPHY;  Surgeon: TEarly Osmond MD;  [place];  Service: Cardiovascular;  Laterality: N/A;   SPHINCTEROTOMY N/A 02/25/2021   Procedure: SPHINCTEROTOMY;  Surgeon: RRogene Houston MD;  Location: [facility];  Service: Gastroenterology;  Laterality: N/A;   STENT REMOVAL  02/27/2021   Procedure: STENT REMOVAL (8.5Fr x 9cm);  Surgeon: RRogene Houston MD;  Location: [facility];  Service: Gastroenterology;;    Home Medications:  Allergies as of 08/21/2022   No Known Allergies      Medication List        Accurate as of August 21, 2022 12:47 PM. If you have any questions, ask your nurse or doctor.          acetaminophen 500 MG tablet Commonly known as: TYLENOL Take 500 mg by mouth every 6 (six) hours as needed for moderate pain or headache.   albuterol 108 (90 Base) MCG/ACT inhaler Commonly known as: VENTOLIN HFA Inhale 2 puffs into the lungs every 6 (six) hours as needed for wheezing or shortness of breath.   atorvastatin 40 MG tablet Commonly known as: LIPITOR Take 1 tablet (40 mg total) by mouth daily at 6 PM.   carvedilol 6.25 MG tablet Commonly known as: COREG Take 1 tablet (6.25 mg  total) by mouth 2 (two) times daily.   cephALEXin 500 MG capsule Commonly known as: KEFLEX Take 500 mg by mouth every 6 (six) hours.   CISPLATIN IV Inject into the vein once a week. Days 1, 8 every 21 days   enoxaparin 100 MG/ML injection Commonly known as: LOVENOX Inject 1 mL (100 mg total) into the skin daily for 10 days.   famotidine 20 MG tablet Commonly known as: PEPCID Take 20 mg by mouth 2 (two) times daily.   ferrous sulfate 325 (65 FE) MG tablet Commonly known as: FerrouSul Take 1 tablet (325 mg total) by mouth daily with breakfast.   GEMZAR IV Inject into the vein once a  week. Days 1, 8 every 21 days   Ginseng 100 MG Caps Take 1 capsule by mouth daily.   IMFINZI IV Inject into the vein every 21 ( twenty-one) days.   lidocaine-prilocaine cream Commonly known as: EMLA   loratadine 10 MG tablet Commonly known as: CLARITIN Take 10 mg by mouth daily. Takes for a few days after shot to boost WBC. About every 2nd treatment   metFORMIN 500 MG tablet Commonly known as: GLUCOPHAGE Take 1 tablet (500 mg total) by mouth 2 (two) times daily with a meal.   nitroGLYCERIN 0.4 MG SL tablet Commonly known as: Nitrostat Place 1 tablet (0.4 mg total) under the tongue every 5 (five) minutes as needed for chest pain.   pantoprazole 40 MG tablet Commonly known as: PROTONIX Take 1 tablet (40 mg total) by mouth daily.   prochlorperazine 10 MG tablet Commonly known as: COMPAZINE TAKE (1) TABLET BY MOUTH EVERY (6) HOURS AS NEEDED.   tamsulosin 0.4 MG Caps capsule Commonly known as: FLOMAX TAKE (2) CAPSULES BY MOUTH AT BEDTIME.   Vitamin D (Ergocalciferol) 1.25 MG (50000 UNIT) Caps capsule Commonly known as: DRISDOL Take 1 capsule by mouth once a week.   warfarin 5 MG tablet Commonly known as: COUMADIN Take as directed by the anticoagulation clinic. If you are unsure how to take this medication, talk to your nurse or doctor. Original instructions: Take 1 tablet (5 mg total) by mouth daily.        Allergies: No Known Allergies  Family History: Family History  Problem Relation Age of Onset   CAD Mother    Cancer Neg Hx     Social History:  reports that he has been smoking cigarettes. He has a 28.00 pack-year smoking history. He has never used smokeless tobacco. He reports that he does not currently use alcohol. He reports that he does not use drugs.  ROS: All other review of systems were reviewed and are negative except what is noted above in HPI  Physical Exam: BP 135/75   Pulse 92   Constitutional:  Alert and oriented, No acute distress. HEENT:  [place] AT, moist mucus membranes.  Trachea midline, no masses. Cardiovascular: No clubbing, cyanosis, or edema. Respiratory: Normal respiratory effort, no increased work of breathing. GI: Abdomen is soft, nontender, nondistended, no abdominal masses GU: No CVA tenderness.  Lymph: No cervical or inguinal lymphadenopathy. Skin: No rashes, bruises or suspicious lesions. Neurologic: Grossly intact, no focal deficits, moving all 4 extremities. Psychiatric: Normal mood and affect.  Laboratory Data: Lab Results  Component Value Date   WBC 4.8 08/05/2022   HGB 11.2 (L) 08/05/2022   HCT 32.9 (L) 08/05/2022   MCV 98.5 08/05/2022   PLT 76 (L) 08/05/2022    Lab Results  Component Value Date   CREATININE 1.25 (  H) 08/05/2022    No results found for: "PSA"  No results found for: "TESTOSTERONE"  Lab Results  Component Value Date   HGBA1C 5.8 (H) 09/08/2021    Urinalysis    Component Value Date/Time   COLORURINE AMBER (A) 05/25/2021 1412   APPEARANCEUR HAZY (A) 05/25/2021 1412   LABSPEC 1.033 (H) 05/25/2021 1412   PHURINE 5.0 05/25/2021 1412   GLUCOSEU NEGATIVE 05/25/2021 1412   HGBUR NEGATIVE 05/25/2021 1412   BILIRUBINUR MODERATE (A) 05/25/2021 1412   KETONESUR 5 (A) 05/25/2021 1412   PROTEINUR 100 (A) 05/25/2021 1412   NITRITE NEGATIVE 05/25/2021 [place] 05/25/2021 1412    Lab Results  Component Value Date   BACTERIA NONE SEEN 05/25/2021    Pertinent Imaging: CT 05/2022: Images reviewed and discussed with the patient  No results found for this or any previous visit.  Results for orders placed during the hospital encounter of 10/10/20  US Venous Img Lower Bilateral (DVT)  Narrative CLINICAL DATA:  Bilateral lower extremity pain. History of smoking. Evaluate for DVT.  EXAM: BILATERAL LOWER EXTREMITY VENOUS DOPPLER ULTRASOUND  TECHNIQUE: Gray-scale sonography with graded compression, as well as color Doppler and duplex ultrasound were performed  to evaluate the lower extremity deep venous systems from the level of the common femoral vein and including the common femoral, femoral, profunda femoral, popliteal and calf veins including the posterior tibial, peroneal and gastrocnemius veins when visible. The superficial great saphenous vein was also interrogated. Spectral Doppler was utilized to evaluate flow at rest and with distal augmentation maneuvers in the common femoral, femoral and popliteal veins.  COMPARISON:  None.  FINDINGS: RIGHT LOWER EXTREMITY  Common Femoral Vein: No evidence of thrombus. Normal compressibility, respiratory phasicity and response to augmentation.  Saphenofemoral Junction: No evidence of thrombus. Normal compressibility and flow on color Doppler imaging.  Profunda Femoral Vein: No evidence of thrombus. Normal compressibility and flow on color Doppler imaging.  Femoral Vein: No evidence of thrombus. Normal compressibility, respiratory phasicity and response to augmentation.  Popliteal Vein: No evidence of thrombus. Normal compressibility, respiratory phasicity and response to augmentation.  Calf Veins: No evidence of thrombus. Normal compressibility and flow on color Doppler imaging.  Superficial Great Saphenous Vein: No evidence of thrombus. Normal compressibility.  Venous Reflux:  None.  Other Findings:  None.  LEFT LOWER EXTREMITY  Common Femoral Vein: No evidence of thrombus. Normal compressibility, respiratory phasicity and response to augmentation.  Saphenofemoral Junction: No evidence of thrombus. Normal compressibility and flow on color Doppler imaging.  Profunda Femoral Vein: No evidence of thrombus. Normal compressibility and flow on color Doppler imaging.  Femoral Vein: No evidence of thrombus. Normal compressibility, respiratory phasicity and response to augmentation.  Popliteal Vein: No evidence of thrombus. Normal compressibility, respiratory phasicity and response  to augmentation.  Calf Veins: No evidence of thrombus. Normal compressibility and flow on color Doppler imaging.  Superficial Great Saphenous Vein: No evidence of thrombus. Normal compressibility.  Venous Reflux:  None.  Other Findings:  None.  IMPRESSION: No evidence of DVT within either lower extremity.   Electronically Signed By: Sandi Mariscal M.D. On: 10/10/2020 10:45  No results found for this or any previous visit.  No results found for this or any previous visit.  No results found for this or any previous visit.  No valid procedures specified. No results found for this or any previous visit.  No results found for this or any previous visit.   Assessment & Plan:  1. Gross hematuria -Followup 1 month for cystoscopy -urine for cytology - Urinalysis, Routine w reflex microscopic - BLADDER SCAN AMB NON-IMAGING  2. Benign prostatic hyperplasia with urinary obstruction -uroxatral '10mg'$  daily  3. Nocturia -uroxatral '10mg'$  qhs   No follow-ups on file.  Nicolette Bang, MD  [facility] [place]

## 2022-08-24 LAB — CYTOLOGY, URINE

## 2022-08-28 ENCOUNTER — Ambulatory Visit (HOSPITAL_COMMUNITY)
Admission: RE | Admit: 2022-08-28 | Discharge: 2022-08-28 | Disposition: A | Discharge status: Home / Self Care | Payer: Medicare Other | Source: Ambulatory Visit | Attending: Hematology | Admitting: Hematology

## 2022-08-28 DIAGNOSIS — C221 Intrahepatic bile duct carcinoma: Secondary | ICD-10-CM | POA: Insufficient documentation

## 2022-08-28 MED ORDER — IOHEXOL 300 MG/ML  SOLN
100.0000 mL | Freq: Once | INTRAMUSCULAR | Status: AC | PRN
  Administered 2022-08-28: 100 mL via INTRAVENOUS

## 2022-08-31 ENCOUNTER — Encounter (HOSPITAL_COMMUNITY): Payer: Self-pay | Admitting: *Deleted

## 2022-08-31 NOTE — Progress Notes (Signed)
CTCAP call report received from [facility].  Report is visible and Dr. Delton Coombes made aware.

## 2022-09-01 ENCOUNTER — Inpatient Hospital Stay: Payer: Medicare Other | Attending: Hematology

## 2022-09-01 ENCOUNTER — Telehealth: Payer: Self-pay

## 2022-09-01 ENCOUNTER — Inpatient Hospital Stay: Payer: Medicare Other

## 2022-09-01 ENCOUNTER — Inpatient Hospital Stay (HOSPITAL_BASED_OUTPATIENT_CLINIC_OR_DEPARTMENT_OTHER): Payer: Medicare Other | Admitting: Hematology

## 2022-09-01 VITALS — BP 123/73 | HR 65 | Temp 97.0°F | Resp 18

## 2022-09-01 DIAGNOSIS — Z95828 Presence of other vascular implants and grafts: Secondary | ICD-10-CM | POA: Diagnosis not present

## 2022-09-01 DIAGNOSIS — Z8249 Family history of ischemic heart disease and other diseases of the circulatory system: Secondary | ICD-10-CM | POA: Insufficient documentation

## 2022-09-01 DIAGNOSIS — Z79899 Other long term (current) drug therapy: Secondary | ICD-10-CM | POA: Diagnosis not present

## 2022-09-01 DIAGNOSIS — F17210 Nicotine dependence, cigarettes, uncomplicated: Secondary | ICD-10-CM | POA: Diagnosis not present

## 2022-09-01 DIAGNOSIS — E785 Hyperlipidemia, unspecified: Secondary | ICD-10-CM | POA: Diagnosis not present

## 2022-09-01 DIAGNOSIS — Z5112 Encounter for antineoplastic immunotherapy: Secondary | ICD-10-CM | POA: Diagnosis present

## 2022-09-01 DIAGNOSIS — Z5986 Financial insecurity: Secondary | ICD-10-CM | POA: Insufficient documentation

## 2022-09-01 DIAGNOSIS — C221 Intrahepatic bile duct carcinoma: Secondary | ICD-10-CM

## 2022-09-01 DIAGNOSIS — I509 Heart failure, unspecified: Secondary | ICD-10-CM | POA: Diagnosis not present

## 2022-09-01 DIAGNOSIS — I110 Hypertensive heart disease with heart failure: Secondary | ICD-10-CM | POA: Insufficient documentation

## 2022-09-01 DIAGNOSIS — G479 Sleep disorder, unspecified: Secondary | ICD-10-CM | POA: Insufficient documentation

## 2022-09-01 DIAGNOSIS — Z7901 Long term (current) use of anticoagulants: Secondary | ICD-10-CM | POA: Diagnosis not present

## 2022-09-01 DIAGNOSIS — R634 Abnormal weight loss: Secondary | ICD-10-CM | POA: Diagnosis not present

## 2022-09-01 LAB — COMPREHENSIVE METABOLIC PANEL
ALT: 42 U/L (ref 0–44)
AST: 58 U/L — ABNORMAL HIGH (ref 15–41)
Albumin: 2.9 g/dL — ABNORMAL LOW (ref 3.5–5.0)
Alkaline Phosphatase: 263 U/L — ABNORMAL HIGH (ref 38–126)
Anion gap: 8 (ref 5–15)
BUN: 18 mg/dL (ref 8–23)
CO2: 23 mmol/L (ref 22–32)
Calcium: 8.6 mg/dL — ABNORMAL LOW (ref 8.9–10.3)
Chloride: 103 mmol/L (ref 98–111)
Creatinine, Ser: 1.38 mg/dL — ABNORMAL HIGH (ref 0.61–1.24)
GFR, Estimated: 56 mL/min — ABNORMAL LOW (ref >60)
Glucose, Bld: 135 mg/dL — ABNORMAL HIGH (ref 70–99)
Potassium: 4.2 mmol/L (ref 3.5–5.1)
Sodium: 134 mmol/L — ABNORMAL LOW (ref 135–145)
Total Bilirubin: 1.4 mg/dL — ABNORMAL HIGH (ref 0.3–1.2)
Total Protein: 6.9 g/dL (ref 6.5–8.1)

## 2022-09-01 LAB — CBC WITH DIFFERENTIAL/PLATELET
Abs Immature Granulocytes: 0.01 10*3/uL (ref 0.00–0.07)
Basophils Absolute: 0.1 10*3/uL (ref 0.0–0.1)
Basophils Relative: 1 %
Eosinophils Absolute: 0.3 10*3/uL (ref 0.0–0.5)
Eosinophils Relative: 6 %
HCT: 35.7 % — ABNORMAL LOW (ref 39.0–52.0)
Hemoglobin: 12 g/dL — ABNORMAL LOW (ref 13.0–17.0)
Immature Granulocytes: 0 %
Lymphocytes Relative: 19 %
Lymphs Abs: 1 10*3/uL (ref 0.7–4.0)
MCH: 33.1 pg (ref 26.0–34.0)
MCHC: 33.6 g/dL (ref 30.0–36.0)
MCV: 98.3 fL (ref 80.0–100.0)
Monocytes Absolute: 0.6 10*3/uL (ref 0.1–1.0)
Monocytes Relative: 12 %
Neutro Abs: 3.2 10*3/uL (ref 1.7–7.7)
Neutrophils Relative %: 62 %
Platelets: 83 10*3/uL — ABNORMAL LOW (ref 150–400)
RBC: 3.63 MIL/uL — ABNORMAL LOW (ref 4.22–5.81)
RDW: 14 % (ref 11.5–15.5)
WBC: 5 10*3/uL (ref 4.0–10.5)
nRBC: 0 % (ref 0.0–0.2)

## 2022-09-01 LAB — MAGNESIUM: Magnesium: 2 mg/dL (ref 1.7–2.4)

## 2022-09-01 MED ORDER — HEPARIN SOD (PORK) LOCK FLUSH 100 UNIT/ML IV SOLN
500.0000 [IU] | Freq: Once | INTRAVENOUS | Status: AC | PRN
  Administered 2022-09-01: 500 [IU]

## 2022-09-01 MED ORDER — SODIUM CHLORIDE 0.9 % IV SOLN: Freq: Once | INTRAVENOUS | Status: AC

## 2022-09-01 MED ORDER — SODIUM CHLORIDE 0.9% FLUSH
10.0000 mL | INTRAVENOUS | Status: DC | PRN
  Administered 2022-09-01: 10 mL via INTRAVENOUS

## 2022-09-01 MED ORDER — SODIUM CHLORIDE 0.9% FLUSH
10.0000 mL | INTRAVENOUS | Status: DC | PRN
  Administered 2022-09-01: 10 mL

## 2022-09-01 MED ORDER — SODIUM CHLORIDE 0.9 % IV SOLN: Freq: Once | INTRAVENOUS | Status: DC

## 2022-09-01 MED ORDER — SODIUM CHLORIDE 0.9 % IV SOLN
1500.0000 mg | Freq: Once | INTRAVENOUS | Status: AC
  Administered 2022-09-01: 1500 mg via INTRAVENOUS
  Filled 2022-09-01: qty 30

## 2022-09-01 NOTE — Patient Instructions (Signed)
[place] at [facility] Discharge Instructions   You were seen and examined today by Dr. Delton Coombes.  He reviewed the results of your CT scan which is showing the cancer is growing some in your abdomen. Dr. Raliegh Ip is hesitant to restart chemotherapy at this time due to your platelet count being low and being on Coumadin. The chemotherapy could knock your platelets down even further and put you at high risk for bleeding.  We will continue with Imfinzi only for treatment at this time to keep the cancer under control.   Return as scheduled.    Thank you for choosing [place] at [facility] to provide your oncology and hematology care.  To afford each patient quality time with our provider, please arrive at least 15 minutes before your scheduled appointment time.   If you have a lab appointment with the [facility] please come in thru the Main Entrance and check in at the main information desk.  You need to re-schedule your appointment should you arrive 10 or more minutes late.  We strive to give you quality time with our providers, and arriving late affects you and other patients whose appointments are after yours.  Also, if you no show three or more times for appointments you may be dismissed from the clinic at the providers discretion.     Again, thank you for choosing [facility].  Our hope is that these requests will decrease the amount of time that you wait before being seen by our physicians.       _____________________________________________________________  Should you have questions after your visit to [facility], please contact our office at [phone] and follow the prompts.  Our office hours are 8:00 a.m. and 4:30 p.m. Monday - Friday.  Please note that voicemails left after 4:00 p.m. may not be returned until the following business day.  We are closed weekends and major holidays.  You do have access to a  nurse 24-7, just call the main number to the clinic [phone] and do not press any options, hold on the line and a nurse will answer the phone.    For prescription refill requests, have your pharmacy contact our office and allow 72 hours.    Due to Covid, you will need to wear a mask upon entering the hospital. If you do not have a mask, a mask will be given to you at the Main Entrance upon arrival. For doctor visits, patients may have 1 support person age 48 or older with them. For treatment visits, patients can not have anyone with them due to social distancing guidelines and our immunocompromised population.

## 2022-09-01 NOTE — Progress Notes (Signed)
Patient has been examined by Dr. Delton Coombes, and vital signs and labs have been reviewed. ANC, Creatinine, LFTs, hemoglobin, and platelets are within treatment parameters per M.D. - pt may proceed with treatment (Imfinzi only).  Primary RN and pharmacy notified.

## 2022-09-01 NOTE — Telephone Encounter (Signed)
-----  Message from Cleon Gustin, MD sent at 09/01/2022  9:03 AM EST ----- Negative cytology ----- Message ----- From: Sherrilyn Rist, CMA Sent: 08/24/2022   4:30 PM EST To: Cleon Gustin, MD  Please review

## 2022-09-01 NOTE — Progress Notes (Signed)
[address], [place] [address]   CLINIC:  Medical Oncology/Hematology  PCP:  [facility] [address], [place] [address] [place] [address] [phone]   REASON FOR VISIT:  Follow-up for intrahepatic cholangiocarcinoma  PRIOR THERAPY: none  NGS Results: not done  CURRENT THERAPY: Cisplatin + Gemcitabine D1, D15 q 28d  BRIEF ONCOLOGIC HISTORY:  Oncology History  Cholangiocarcinoma ([place])  02/28/2021 Initial Diagnosis   Cholangiocarcinoma ([place])   07/17/2021 Cancer Staging   Staging form: Perihilar Bile Ducts, AJCC 8th Edition - Clinical stage from 07/17/2021: Stage IVB (cTX, cNX, pM1) - Signed by Derek Jack, MD on 07/17/2021 Stage prefix: Initial diagnosis   07/22/2021 - 04/01/2022 Chemotherapy   Patient is on Treatment Plan : BILIARY TRACT Cisplatin + Gemcitabine D1,15 + Imfinzi day 1 q28d     07/22/2021 -  Chemotherapy   Patient is on Treatment Plan : BILIARY TRACT IMFINZI D1  +  Cisplatin + Gemcitabine  D1, 15 q28d       CANCER STAGING:  Cancer Staging  Cholangiocarcinoma ([place]) Staging form: Perihilar Bile Ducts, AJCC 8th Edition - Clinical stage from 07/17/2021: Stage IVB (cTX, cNX, pM1) - Signed by Derek Jack, MD on 07/17/2021   INTERVAL HISTORY:  Jonathan Keith, a 67 y.o. male, seen for follow-up of cholangiocarcinoma and toxicity assessment prior to next cycle of treatment.  Reports energy levels 25%.  Denies any skin rashes.  Denies any diarrhea or dry cough.  REVIEW OF SYSTEMS:  Review of Systems  Gastrointestinal:  Negative for diarrhea.  Skin:  Negative for rash.  Neurological:  Negative for numbness.  Psychiatric/Behavioral:  Positive for sleep disturbance.   All other systems reviewed and are negative.   PAST MEDICAL/SURGICAL HISTORY:  Past Medical History:  Diagnosis Date   Cancer ([place])    CHF (congestive heart failure) (HCC)    Chronic pain    neck, back, knees   Class 1 obesity due  to excess calories with body mass index (BMI) of 31.0 to 31.9 in adult    Claudication [facility])    Coronary artery disease    DDD (degenerative disc disease), cervical    Diabetes mellitus ([place])    Dyslipidemia    Hypertension    Port-A-Cath in place 07/20/2021   Tobacco dependence    Past Surgical History:  Procedure Laterality Date   BILIARY BRUSHING N/A 02/25/2021   Procedure: BILIARY BRUSHING;  Surgeon: Rogene Houston, MD;  Location: [facility];  Service: Gastroenterology;  Laterality: N/A;   BILIARY STENT PLACEMENT N/A 02/25/2021   Procedure: BILIARY STENT PLACEMENT;  Surgeon: Rogene Houston, MD;  Location: [facility] ORS;  Service: Gastroenterology;  Laterality: N/A;   BILIARY STENT PLACEMENT  02/27/2021   Procedure: BILIARY STENT PLACEMENT (10FR x 9cm) IN THE RIGHT SYSTEM;  Surgeon: Rogene Houston, MD;  Location: [facility] ORS;  Service: Gastroenterology;;   BREAST SURGERY Left    benign lump- in his 81s.   CARDIAC CATHETERIZATION     ERCP N/A 02/25/2021   Procedure: ENDOSCOPIC RETROGRADE CHOLANGIOPANCREATOGRAPHY (ERCP);  Surgeon: Rogene Houston, MD;  Location: [facility];  Service: Gastroenterology;  Laterality: N/A;   ERCP N/A 02/27/2021   Procedure: ENDOSCOPIC RETROGRADE CHOLANGIOPANCREATOGRAPHY (ERCP);  Surgeon: Rogene Houston, MD;  Location: [facility] OR[facility];  Service: Gastroenterology;  Laterality: N/A;   IR IMAGING GUIDED PORT INSERTION  07/21/2021   KNEE SURGERY Left    x2   LEFT HEART CATH AND CORONARY ANGIOGRAPHY N/A 09/08/2021  Procedure: LEFT HEART CATH AND CORONARY ANGIOGRAPHY;  Surgeon: Early Osmond, MD;  [facility];  Service: Cardiovascular;  Laterality: N/A;   SPHINCTEROTOMY N/A 02/25/2021   Procedure: SPHINCTEROTOMY;  Surgeon: Rogene Houston, MD;  Location: [facility];  Service: Gastroenterology;  Laterality: N/A;   STENT REMOVAL  02/27/2021   Procedure: STENT REMOVAL (8.5Fr x 9cm);  Surgeon: Rogene Houston, MD;  Location: [facility];  Service: Gastroenterology;;     SOCIAL HISTORY:  Social History   Socioeconomic History   Marital status: Married    Spouse name: Not on file   Number of children: Not on file   Years of education: Not on file   Highest education level: Not on file  Occupational History   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  Tobacco Use   Smoking status: Every Day    Packs/day: 0.50    Years: 56.00    Total pack years: 28.00    Types: Cigarettes   Smokeless tobacco: Never  Vaping Use   Vaping Use: Never used  Substance and Sexual Activity   Alcohol use: Not Currently    Comment: stopped 2.5 years ago 03/11/21   Drug use: Never   Sexual activity: Not on file  Other Topics Concern   Not on file  Social History Narrative   Not on file   Social Determinants of Health   Financial Resource Strain: Medium Risk (03/07/2021)   Overall Financial Resource Strain (CARDIA)    Difficulty of Paying Living Expenses: Somewhat hard  Food Insecurity: No Food Insecurity (03/07/2021)   Hunger Vital Sign    Worried About Running Out of Food in the Last Year: Never true    [place] in the Last Year: Never true  Transportation Needs: No Transportation Needs (03/07/2021)   PRAPARE - Hydrologist (Medical): No    Lack of Transportation (Non-Medical): No  Physical Activity: Insufficiently Active (03/07/2021)   Exercise Vital Sign    Days of Exercise per Week: 2 days    Minutes of Exercise per Session: 20 min  Stress: No Stress Concern Present (03/07/2021)   [place]    Feeling of Stress : Only a little  Social Connections: Moderately Integrated (03/07/2021)   Social Connection and Isolation Panel [NHANES]    Frequency of Communication with Friends and Family: More than three times a week    Frequency of Social Gatherings with Friends and Family: More than three times a week    Attends Religious Services: More than 4 times per year     Active Member of [employer] or Organizations: No    Attends Archivist Meetings: Never    Marital Status: Married  Human resources officer Violence: Not At Risk (03/07/2021)   Humiliation, Afraid, Rape, and Kick questionnaire    Fear of Current or Ex-Partner: No    Emotionally Abused: No    Physically Abused: No    Sexually Abused: No    FAMILY HISTORY:  Family History  Problem Relation Age of Onset   CAD Mother    Cancer Neg Hx     CURRENT MEDICATIONS:  Current Outpatient Medications  Medication Sig Dispense Refill   acetaminophen (TYLENOL) 500 MG tablet Take 500 mg by mouth every 6 (six) hours as needed for moderate pain or headache.     albuterol (VENTOLIN HFA) 108 (90 Base) MCG/ACT inhaler Inhale 2 puffs into the lungs every 6 (six)  hours as needed for wheezing or shortness of breath. 6.7 g 1   alfuzosin (UROXATRAL) 10 MG 24 hr tablet Take 1 tablet (10 mg total) by mouth at bedtime. 30 tablet 11   atorvastatin (LIPITOR) 40 MG tablet Take 1 tablet (40 mg total) by mouth daily at 6 PM. 90 tablet 3   carvedilol (COREG) 6.25 MG tablet Take 1 tablet (6.25 mg total) by mouth 2 (two) times daily. 180 tablet 3   cephALEXin (KEFLEX) 500 MG capsule Take 500 mg by mouth every 6 (six) hours.     CISPLATIN IV Inject into the vein once a week. Days 1, 8 every 21 days     Durvalumab (IMFINZI IV) Inject into the vein every 21 ( twenty-one) days.     enoxaparin (LOVENOX) 100 MG/ML injection Inject 1 mL (100 mg total) into the skin daily for 10 days. 10 mL 0   famotidine (PEPCID) 20 MG tablet Take 20 mg by mouth 2 (two) times daily.     ferrous sulfate (FERROUSUL) 325 (65 FE) MG tablet Take 1 tablet (325 mg total) by mouth daily with breakfast.     Gemcitabine HCl (GEMZAR IV) Inject into the vein once a week. Days 1, 8 every 21 days     Ginseng 100 MG CAPS Take 1 capsule by mouth daily.     lidocaine-prilocaine (EMLA) cream      loratadine (CLARITIN) 10 MG tablet Take 10 mg by mouth daily. Takes  for a few days after shot to boost WBC. About every 2nd treatment     metFORMIN (GLUCOPHAGE) 500 MG tablet Take 1 tablet (500 mg total) by mouth 2 (two) times daily with a meal. 60 tablet 0   nitroGLYCERIN (NITROSTAT) 0.4 MG SL tablet Place 1 tablet (0.4 mg total) under the tongue every 5 (five) minutes as needed for chest pain. 25 tablet 3   pantoprazole (PROTONIX) 40 MG tablet Take 1 tablet (40 mg total) by mouth daily. 90 tablet 3   prochlorperazine (COMPAZINE) 10 MG tablet TAKE (1) TABLET BY MOUTH EVERY (6) HOURS AS NEEDED. 90 tablet 3   tamsulosin (FLOMAX) 0.4 MG CAPS capsule TAKE (2) CAPSULES BY MOUTH AT BEDTIME. 60 capsule 6   Vitamin D, Ergocalciferol, (DRISDOL) 1.25 MG (50000 UNIT) CAPS capsule Take 1 capsule by mouth once a week.     warfarin (COUMADIN) 5 MG tablet Take 1 tablet (5 mg total) by mouth daily. 45 tablet 3   No current facility-administered medications for this visit.    ALLERGIES:  No Known Allergies  PHYSICAL EXAM:  Performance status (ECOG): 0 - Asymptomatic  There were no vitals filed for this visit. Wt Readings from Last 3 Encounters:  08/05/22 158 lb 4.6 oz (71.8 kg)  07/08/22 155 lb 9.6 oz (70.6 kg)  06/12/22 156 lb (70.8 kg)   Physical Exam Vitals reviewed.  Constitutional:      Appearance: Normal appearance.  Cardiovascular:     Rate and Rhythm: Normal rate and regular rhythm.     Pulses: Normal pulses.     Heart sounds: Normal heart sounds.  Pulmonary:     Effort: Pulmonary effort is normal.     Breath sounds: Normal breath sounds.  Abdominal:     Palpations: Abdomen is soft. There is no mass.     Tenderness: There is no abdominal tenderness.  Musculoskeletal:     Right lower leg: No edema.     Left lower leg: No edema.  Neurological:  General: No focal deficit present.     Mental Status: He is alert and oriented to person, place, and time.  Psychiatric:        Mood and Affect: Mood normal.        Behavior: Behavior normal.      LABORATORY DATA:  I have reviewed the labs as listed.     Latest Ref Rng & Units 08/05/2022    8:10 AM 07/08/2022    7:50 AM 06/10/2022    7:59 AM  CBC  WBC 4.0 - 10.5 K/uL 4.8  5.9  4.2   Hemoglobin 13.0 - 17.0 g/dL 11.2  11.4  10.8   Hematocrit 39.0 - 52.0 % 32.9  33.9  32.3   Platelets 150 - 400 K/uL 76  69  90       Latest Ref Rng & Units 08/05/2022    8:10 AM 07/08/2022    7:50 AM 06/10/2022    7:59 AM  CMP  Glucose 70 - 99 mg/dL 144  189  186   BUN 8 - 23 mg/dL '12  16  13   '$ Creatinine 0.61 - 1.24 mg/dL 1.25  1.27  1.16   Sodium 135 - 145 mmol/L 133  132  132   Potassium 3.5 - 5.1 mmol/L 4.3  4.3  3.9   Chloride 98 - 111 mmol/L 103  101  102   CO2 22 - 32 mmol/L '23  22  23   '$ Calcium 8.9 - 10.3 mg/dL 8.3  8.7  8.5   Total Protein 6.5 - 8.1 g/dL 6.5  6.8  6.7   Total Bilirubin 0.3 - 1.2 mg/dL 1.3  1.2  1.6   Alkaline Phos 38 - 126 U/L 252  243  240   AST 15 - 41 U/L 65  53  49   ALT 0 - 44 U/L 47  40  40     DIAGNOSTIC IMAGING:  I have independently reviewed the scans and discussed with the patient. CT CHEST ABDOMEN PELVIS W CONTRAST  Result Date: 08/29/2022 CLINICAL DATA:  Intrahepatic cholangiocarcinoma. On chemotherapy status post sphincterotomy with stent placement. * Tracking Code: BO * EXAM: CT CHEST, ABDOMEN, AND PELVIS WITH CONTRAST TECHNIQUE: Multidetector CT imaging of the chest, abdomen and pelvis was performed following the standard protocol during bolus administration of intravenous contrast. RADIATION DOSE REDUCTION: This exam was performed according to the departmental dose-optimization program which includes automated exposure control, adjustment of the mA and/or kV according to patient size and/or use of iterative reconstruction technique. CONTRAST:  149m OMNIPAQUE IOHEXOL 300 MG/ML  SOLN COMPARISON:  CT 06/04/2022 and older FINDINGS: CT CHEST FINDINGS Cardiovascular: Right upper chest port. Coronary artery calcifications are seen. No pericardial  effusion. The thoracic aorta has scattered atherosclerotic plaque which is partially calcified. There is some areas of irregular plaque along the distal descending thoracic aorta. Diameter of the ascending aorta has dimensions of 3.6 cm. There is an atypical contour to the left ventricle with myocardial thinning proximally inferior with outward bulge, unchanged from previous. Please correlate for previous insult and possible ventricular aneurysm. In addition the previous filling defect within the apex of the left ventricle appears smaller today. This may have been a thrombus. Mediastinum/Nodes: No specific abnormal lymph node enlargement identified in the axillary region, hilum or mediastinum. There are a few small less than 1 cm in size mediastinal nodes seen, not pathologic by size criteria and are similar to the previous examination. Slightly patulous thoracic esophagus. Lungs/Pleura: No  consolidation, pneumothorax or effusion. There is some peripheral areas of interstitial septal thickening particularly in the right lower lung. Mild peribronchial thickening identified along the right lower lobe There are some small lung nodules identified. This includes right upper lobe on series 3, image 59 measuring 4 mm which was not seen on the prior. Tiny nodule posterolateral middle lobe on image 109 measuring 3 mm is also new. This could be cavitary. 2 mm nodule along the interlobar fissure left lung on image 81 of series 3 is stable. This was calcified previously. The differences today could be slice misregistration. There is also a calcified nodule subpleural left upper lobe on image 32. Evidence of old granulomatous disease. Few other calcified nodules elsewhere in the left lung. Musculoskeletal: Mild curvature of the thoracic spine. Scattered degenerative changes. Multilevel Schmorl's node changes as well along the spine. CT ABDOMEN PELVIS FINDINGS Hepatobiliary: Once again there is streak artifact related to the  embolization material within the central and right hepatic lobes. The continues to be peripheral patchy areas of moderate biliary duct dilatation scattered throughout the liver. A central mass lesion is suspected but poorly defined with the extensive streak artifact. Metallic stent along the common duct extrahepatic. Probable small area of nodal tissue in the hilum of the liver on image 65 versus separate tissue mass. What is seen of the portal vein is patent but again significant portions are obscured by the artifacts. Gallbladder is dilated. Pancreas: Mild pancreatic parenchymal atrophy without obvious mass lesion or ductal dilatation. Spleen: Normal spleen size. Adrenals/Urinary Tract: Adrenal glands are preserved. No enhancing renal mass. No renal collecting system dilatation. Areas of normal course and caliber down to the bladder. Preserved contours of the urinary bladder. There is a luminal bladder stone on the right side near the margin of the UVJ. There is also mass effect along the base of the bladder related to the prostate. Please correlate for BPH changes. Stomach/Bowel: Sigmoid colon diverticula with some circular muscle hypertrophy. The proximal colon is nondilated. Scattered stool. Normal appendix. Contrast and debris in the stomach. Slight nodular wall thickening along the duodenal the second portion uncertain etiology and significance. The small bowel more distally is nondilated but there is suggestion of some scattered fold thickening. Vascular/Lymphatic: Normal caliber aorta and IVC with scattered atherosclerotic changes which is advanced along the aorta with areas of stenosis along the distal abdominal aorta and along both common iliac arteries. Please correlate for any specific symptoms. Several small nodes identified in the retroperitoneum particularly para celiac. These are not pathologic by size criteria, are more numerous than usually seen but unchanged from previous. Reproductive: Enlarged  prostate with mass effect along the base of the bladder. Please correlate for BPH changes in the patient's PSA. Other: Mild ascites seen particularly along the margin of the liver. There is some haziness in thickening along mesenteric fat and omentum anteriorly with thickening extending along the margin of the lateral conal fascia bilaterally. Early changes of peritoneal carcinomatosis are concern. Musculoskeletal: Curvature and degenerative changes along the spine. IMPRESSION: Again unchanged appearance of the liver with peripheral areas of moderate biliary ductal dilatation. Likely central mass but obscured by the extensive streak artifact related to the metallic foci in the liver. Extrahepatic common duct metallic stent in place. Mild ascites identified. There is some haziness to the omentum and mesentery particularly in the anterior abdomen and right upper quadrant with nodular contours. These changes are worrisome for developing peritoneal carcinomatosis. No bowel obstruction or dilatation. Subtle areas  of mild fold thickening along loops of small bowel, nonspecific. Abnormal contour to the left ventricle of the heart with areas of ventricular aneurysm. The previous luminal filling defect in the left ventricle is improving. Please correlate with prior thrombus. There are 2 new tiny lung nodules of uncertain etiology and significance. Recommend follow up in 3 months. A call was made to the ordering service when the office opens by the Radiology physician assistant team as the office is closed today. Electronically Signed   By: Jill Side M.D.   On: 08/29/2022 13:42     ASSESSMENT:  Metastatic cholangiocarcinoma: - 02/2021 initial diagnosis presentation with painless jaundice - ERCP with sphincterotomy, subsequent ERCP with plastic biliary stent placement. - MRI-2 cm mass involving the common hepatic duct with upstream biliary dilatation. - 03/2021 consult at [facility] with GI and surgery-confirm diagnosis of  perihilar cholangiocarcinoma - 03/21/2021-biopsy/pathology, ERCP-common hepatic duct cytology and brushings performed.  EUS-1 enlarged lymph node versus tumor implant in the gastrohepatic ligament-negative for malignancy.  Single biliary plastic stent removed and replaced with the plastic stent in the right and left ducts. - 04/17/2021-right portal vein embolization to promote left hepatic hypertrophy. - 07/02/2021-diagnostic laparoscopy with multiple areas concerning for metastatic blocks identified in the abdomen and peritoneal surfaces.  6 separate biopsies obtained. - Pathology consistent with metastatic carcinoma. - 37 pound weight loss in the last 5 months. - Cycle 1 of gemcitabine, cisplatin, durvalumab on 07/22/2021.  2.  Social history/family: - Lives at home with his wife.  He worked as a Development worker, community. - Current active smoker, 1 pack/day since age 33. - Maternal grandmother had cancer.  Maternal uncle had brain tumor.   PLAN:  Metastatic cholangiocarcinoma: - CT CAP (08/28/2022): Reviewed by me shows some haziness to the omentum and mesentery particularly in the anterior abdomen and right upper quadrant with nodular contours concerning for developing peritoneal carcinomatosis.  Liver lesions were stable. - Reviewed labs today which showed mildly elevated AST and alk phos.  Creatinine is 1.38 and stable.  CBC was grossly normal. - Platelet count is 83 K. - We talked about reinitiating cisplatin and gemcitabine.  However he has thrombocytopenia which will likely worsen with treatments and potentially increase his risk of bleeding as he is on Coumadin for LV thrombus. - Hence have recommended to just continue Imfinzi at this time.  RTC 4 weeks for follow-up.  Will plan to repeat another scan in 2 months.  2.  Weight loss: - He is eating well and continuing to gain weight.  3.  Left ventricular thrombus (Dx 06/04/2022):: - Continue Coumadin.  He is having repeat echocardiogram  end of the month.  4.  Hypotension: - He has been off of midodrine and his blood pressure is staying normal.   Orders placed this encounter:  No orders of the defined types were placed in this encounter.     Derek Jack, MD [place] [phone]

## 2022-09-01 NOTE — Progress Notes (Signed)
Verbal orders received from Dr . Delton Coombes to use port for treatment today. Dr. Delton Coombes at the beside and port a cath site assessed. No redness, drainage or swelling noted. Patient denies any pain at the site.

## 2022-09-01 NOTE — Progress Notes (Signed)
Upon arrival, I noticed that port area was bruised and there was an open area.  Patient denied any pain, discomfort, or draining.  There was no redness or heat to touch.  MD made aware.   Dr. Delton Coombes evaluated the port site.  We will use the port site today per Dr. Delton Coombes, but he was be referred for a new port.    Patients port flushed without difficulty.  Good blood return noted with no bruising or swelling noted at site.  Patient remains accessed for chemotherapy treatment.

## 2022-09-01 NOTE — Telephone Encounter (Signed)
Made patient's wife aware that patient's urine cytology was negative. Wife voiced that patient has a bladder stone and want to know if the bladder stone is not affecting the patient to voiding, can he just leave it alone for now. Made wife aware that is a question for the MD and I will send Dr. Alyson Ingles a message and once he response someone will reach back out with the MD response.  Wife voiced that the Uroxatral is working great for the patient. Wife/Patient voiced understanding.

## 2022-09-01 NOTE — Patient Instructions (Signed)
[facility]  Discharge Instructions: Thank you for choosing [facility] to provide your oncology and hematology care.  If you have a lab appointment with the Cancer Center, please come in thru the Main Entrance and check in at the main information desk.  Wear comfortable clothing and clothing appropriate for easy access to any Portacath or PICC line.   We strive to give you quality time with your provider. You may need to reschedule your appointment if you arrive late (15 or more minutes).  Arriving late affects you and other patients whose appointments are after yours.  Also, if you miss three or more appointments without notifying the office, you may be dismissed from the clinic at the provider's discretion.      For prescription refill requests, have your pharmacy contact our office and allow 72 hours for refills to be completed.    Today you received the following chemotherapy and/or immunotherapy agents imfinzi   To help prevent nausea and vomiting after your treatment, we encourage you to take your nausea medication as directed.  BELOW ARE SYMPTOMS THAT SHOULD BE REPORTED IMMEDIATELY: *FEVER GREATER THAN 100.4 F (38 C) OR HIGHER *CHILLS OR SWEATING *NAUSEA AND VOMITING THAT IS NOT CONTROLLED WITH YOUR NAUSEA MEDICATION *UNUSUAL SHORTNESS OF BREATH *UNUSUAL BRUISING OR BLEEDING *URINARY PROBLEMS (pain or burning when urinating, or frequent urination) *BOWEL PROBLEMS (unusual diarrhea, constipation, pain near the anus) TENDERNESS IN MOUTH AND THROAT WITH OR WITHOUT PRESENCE OF ULCERS (sore throat, sores in mouth, or a toothache) UNUSUAL RASH, SWELLING OR PAIN  UNUSUAL VAGINAL DISCHARGE OR ITCHING   Items with * indicate a potential emergency and should be followed up as soon as possible or go to the Emergency Department if any problems should occur.  Please show the CHEMOTHERAPY ALERT CARD or IMMUNOTHERAPY ALERT CARD at check-in to the Emergency  Department and triage nurse.  Should you have questions after your visit or need to cancel or reschedule your appointment, please contact [facility] [phone]  and follow the prompts.  Office hours are 8:00 a.m. to 4:30 p.m. Monday - Friday. Please note that voicemails left after 4:00 p.m. may not be returned until the following business day.  We are closed weekends and major holidays. You have access to a nurse at all times for urgent questions. Please call the main number to the clinic [phone] and follow the prompts.  For any non-urgent questions, you may also contact your provider using MyChart. We now offer e-Visits for anyone 18 and older to request care online for non-urgent symptoms. For details visit [email or web address].   Also download the MyChart app! Go to the app store, search "MyChart", open the app, select [facility], and log in with your MyChart username and password.   

## 2022-09-01 NOTE — Progress Notes (Signed)
Patient will by receiving Imfinzi today per providers order.  Vital signs and labs reviewed by MD.  Message received from Anastasio Champion RN/Dr. Delton Coombes patient okay for treatment.  Treatment given today per MD orders.  Stable during infusion without adverse affects.  Vital signs stable.  No complaints at this time.  Discharge from clinic via wheelchair in stable condition.  Alert and oriented X 3.  Follow up with [facility] as scheduled.

## 2022-09-02 ENCOUNTER — Other Ambulatory Visit: Payer: Medicare Other

## 2022-09-02 ENCOUNTER — Ambulatory Visit: Payer: Medicare Other | Attending: Cardiovascular Disease | Admitting: *Deleted

## 2022-09-02 ENCOUNTER — Other Ambulatory Visit: Payer: Self-pay

## 2022-09-02 ENCOUNTER — Ambulatory Visit: Payer: Medicare Other

## 2022-09-02 ENCOUNTER — Ambulatory Visit: Payer: Medicare Other | Admitting: Hematology

## 2022-09-02 DIAGNOSIS — I513 Intracardiac thrombosis, not elsewhere classified: Secondary | ICD-10-CM | POA: Diagnosis not present

## 2022-09-02 DIAGNOSIS — Z5181 Encounter for therapeutic drug level monitoring: Secondary | ICD-10-CM | POA: Diagnosis not present

## 2022-09-02 LAB — CANCER ANTIGEN 19-9: CA 19-9: 557 U/mL — ABNORMAL HIGH (ref 0–35)

## 2022-09-02 LAB — POCT INR: INR: 3 (ref 2.0–3.0)

## 2022-09-02 NOTE — Patient Instructions (Signed)
Continue warfarin 1/2 tablet daily except 1 tablet on Sundays, Tuesdays and Thursdays Recheck INR in 3 wks

## 2022-09-02 NOTE — Telephone Encounter (Signed)
Made patient aware that If he is not having urinary symptoms then we can watch the stone. Made patient aware about the process for the cysto procedure. Patient voiced understanding

## 2022-09-09 ENCOUNTER — Ambulatory Visit (HOSPITAL_COMMUNITY)
Admission: RE | Admit: 2022-09-09 | Discharge: 2022-09-09 | Disposition: A | Discharge status: Home / Self Care | Payer: Medicare Other | Source: Ambulatory Visit | Attending: Cardiovascular Disease | Admitting: Cardiovascular Disease

## 2022-09-09 DIAGNOSIS — I5022 Chronic systolic (congestive) heart failure: Secondary | ICD-10-CM | POA: Diagnosis not present

## 2022-09-09 LAB — ECHOCARDIOGRAM COMPLETE
Area-P 1/2: 2.22 cm2
S' Lateral: 4.05 cm

## 2022-09-09 NOTE — Progress Notes (Signed)
*  PRELIMINARY RESULTS* Echocardiogram 2D Echocardiogram has been performed.  Jonathan Keith 09/09/2022, 12:33 PM

## 2022-09-11 ENCOUNTER — Telehealth: Payer: Self-pay | Admitting: Internal Medicine

## 2022-09-11 ENCOUNTER — Encounter: Payer: Self-pay | Admitting: Internal Medicine

## 2022-09-11 ENCOUNTER — Ambulatory Visit: Payer: Medicare Other | Attending: Cardiovascular Disease | Admitting: Internal Medicine

## 2022-09-11 VITALS — BP 116/72 | HR 84 | Ht 71.0 in | Wt 158.0 lb

## 2022-09-11 DIAGNOSIS — I255 Ischemic cardiomyopathy: Secondary | ICD-10-CM | POA: Diagnosis present

## 2022-09-11 DIAGNOSIS — I513 Intracardiac thrombosis, not elsewhere classified: Secondary | ICD-10-CM | POA: Insufficient documentation

## 2022-09-11 DIAGNOSIS — N179 Acute kidney failure, unspecified: Secondary | ICD-10-CM

## 2022-09-11 DIAGNOSIS — N189 Chronic kidney disease, unspecified: Secondary | ICD-10-CM | POA: Diagnosis not present

## 2022-09-11 MED ORDER — LISINOPRIL 2.5 MG PO TABS: 2.5000 mg | ORAL_TABLET | Freq: Every day | ORAL | 3 refills | Status: DC

## 2022-09-11 NOTE — Telephone Encounter (Signed)
Left a message for patient to call office back regarding testing.

## 2022-09-11 NOTE — Progress Notes (Signed)
Cardiology Office Note  Date: 09/11/2022   ID: Kao, Conry 09/22/1955, MRN [id number]  PCP:  Redmond School, MD  Cardiologist:  Chalmers Guest, MD Electrophysiologist:  None   Reason for Office Visit: CAD and LV thrombus follow-up   History of Present Illness: Jonathan Keith is a 67 y.o. male known to have moderate to severe multivessel CAD on medical management with LVEF 45 to 50% and LV apical thrombus from 06/2022 on Coumadin, metastatic cholangiocarcinoma presented to cardiology clinic for follow-up visit.  Patient underwent CT abdomen pelvis on 08/28/2022 was concerning for developing peritoneal carcinomatosis and has stable liver lesions per his oncologist. Plan was to reinitiate cisplatin and gemcitabine however due to his thrombocytopenia and Coumadin, there was concern for increased risk of bleeding. Echocardiogram obtained in 08/2022 showed LVEF 45 to 50% but no contrast study was performed to rule out LV thrombus definitively. Otherwise, patient is not much active at home, cannot do his daily activities and walks around. Does not have any chest pains, SOB, dizziness, lightness, syncope, leg swelling.  Smokes tobacco and per wife, cut down significantly compared to previous visits.   Past Medical History:  Diagnosis Date   Cancer ([place])    CHF (congestive heart failure) (HCC)    Chronic pain    neck, back, knees   Class 1 obesity due to excess calories with body mass index (BMI) of 31.0 to 31.9 in adult    Claudication [facility])    Coronary artery disease    DDD (degenerative disc disease), cervical    Diabetes mellitus ([place])    Dyslipidemia    Hypertension    Port-A-Cath in place 07/20/2021   Tobacco dependence     Past Surgical History:  Procedure Laterality Date   BILIARY BRUSHING N/A 02/25/2021   Procedure: BILIARY BRUSHING;  Surgeon: Rogene Houston, MD;  Location: [facility];  Service: Gastroenterology;  Laterality: N/A;   BILIARY STENT PLACEMENT N/A  02/25/2021   Procedure: BILIARY STENT PLACEMENT;  Surgeon: Rogene Houston, MD;  Location: [facility];  Service: Gastroenterology;  Laterality: N/A;   BILIARY STENT PLACEMENT  02/27/2021   Procedure: BILIARY STENT PLACEMENT (10FR x 9cm) IN THE RIGHT SYSTEM;  Surgeon: Rogene Houston, MD;  Location: [facility];  Service: Gastroenterology;;   BREAST SURGERY Left    benign lump- in his 39s.   CARDIAC CATHETERIZATION     ERCP N/A 02/25/2021   Procedure: ENDOSCOPIC RETROGRADE CHOLANGIOPANCREATOGRAPHY (ERCP);  Surgeon: Rogene Houston, MD;  Location: [facility];  Service: Gastroenterology;  Laterality: N/A;   ERCP N/A 02/27/2021   Procedure: ENDOSCOPIC RETROGRADE CHOLANGIOPANCREATOGRAPHY (ERCP);  Surgeon: Rogene Houston, MD;  Location: [facility];  Service: Gastroenterology;  Laterality: N/A;   IR IMAGING GUIDED PORT INSERTION  07/21/2021   KNEE SURGERY Left    x2   LEFT HEART CATH AND CORONARY ANGIOGRAPHY N/A 09/08/2021   Procedure: LEFT HEART CATH AND CORONARY ANGIOGRAPHY;  Surgeon: Early Osmond, MD;  Location: [facility];  Service: Cardiovascular;  Laterality: N/A;   SPHINCTEROTOMY N/A 02/25/2021   Procedure: SPHINCTEROTOMY;  Surgeon: Rogene Houston, MD;  Location: [facility];  Service: Gastroenterology;  Laterality: N/A;   STENT REMOVAL  02/27/2021   Procedure: STENT REMOVAL (8.5Fr x 9cm);  Surgeon: Rogene Houston, MD;  Location: [facility];  Service: Gastroenterology;;    Current Outpatient Medications  Medication Sig Dispense Refill   acetaminophen (TYLENOL) 500 MG tablet Take 500 mg by mouth every 6 (  six) hours as needed for moderate pain or headache.     albuterol (VENTOLIN HFA) 108 (90 Base) MCG/ACT inhaler Inhale 2 puffs into the lungs every 6 (six) hours as needed for wheezing or shortness of breath. 6.7 g 1   alfuzosin (UROXATRAL) 10 MG 24 hr tablet Take 1 tablet (10 mg total) by mouth at bedtime. 30 tablet 11   atorvastatin (LIPITOR) 40 MG tablet Take 1 tablet (40 mg total) by mouth  daily at 6 PM. 90 tablet 3   carvedilol (COREG) 6.25 MG tablet Take 1 tablet (6.25 mg total) by mouth 2 (two) times daily. 180 tablet 3   cephALEXin (KEFLEX) 500 MG capsule Take 500 mg by mouth every 6 (six) hours.     CISPLATIN IV Inject into the vein once a week. Days 1, 8 every 21 days     Durvalumab (IMFINZI IV) Inject into the vein every 21 ( twenty-one) days.     famotidine (PEPCID) 20 MG tablet Take 20 mg by mouth 2 (two) times daily.     ferrous sulfate (FERROUSUL) 325 (65 FE) MG tablet Take 1 tablet (325 mg total) by mouth daily with breakfast.     Gemcitabine HCl (GEMZAR IV) Inject into the vein once a week. Days 1, 8 every 21 days     Ginseng 100 MG CAPS Take 1 capsule by mouth daily.     lidocaine-prilocaine (EMLA) cream      lisinopril (ZESTRIL) 2.5 MG tablet Take 1 tablet (2.5 mg total) by mouth daily. 90 tablet 3   loratadine (CLARITIN) 10 MG tablet Take 10 mg by mouth daily. Takes for a few days after shot to boost WBC. About every 2nd treatment     nitroGLYCERIN (NITROSTAT) 0.4 MG SL tablet Place 1 tablet (0.4 mg total) under the tongue every 5 (five) minutes as needed for chest pain. 25 tablet 3   pantoprazole (PROTONIX) 40 MG tablet Take 1 tablet (40 mg total) by mouth daily. 90 tablet 3   prochlorperazine (COMPAZINE) 10 MG tablet TAKE (1) TABLET BY MOUTH EVERY (6) HOURS AS NEEDED. 90 tablet 3   tamsulosin (FLOMAX) 0.4 MG CAPS capsule TAKE (2) CAPSULES BY MOUTH AT BEDTIME. 60 capsule 6   Vitamin D, Ergocalciferol, (DRISDOL) 1.25 MG (50000 UNIT) CAPS capsule Take 1 capsule by mouth once a week.     warfarin (COUMADIN) 5 MG tablet Take 1 tablet (5 mg total) by mouth daily. 45 tablet 3   metFORMIN (GLUCOPHAGE) 500 MG tablet Take 1 tablet (500 mg total) by mouth 2 (two) times daily with a meal. 60 tablet 0   No current facility-administered medications for this visit.   Allergies:  Patient has no known allergies.   Social History: The patient  reports that he has been smoking  cigarettes. He has a 28.00 pack-year smoking history. He has never used smokeless tobacco. He reports that he does not currently use alcohol. He reports that he does not use drugs.   Family History: The patient's family history includes CAD in his mother.   ROS:  Please see the history of present illness. Otherwise, complete review of systems is positive for none.  All other systems are reviewed and negative.   Physical Exam: VS:  BP 116/72   Pulse 84   Ht '5\' 11"'$  (1.803 m)   Wt 158 lb (71.7 kg)   SpO2 96%   BMI 22.04 kg/m , BMI Body mass index is 22.04 kg/m.  Wt Readings from Last 3 Encounters:  09/11/22 158 lb (71.7 kg)  09/01/22 157 lb 6.4 oz (71.4 kg)  08/05/22 158 lb 4.6 oz (71.8 kg)    General: Patient appears comfortable at rest. HEENT: Conjunctiva and lids normal, oropharynx clear with moist mucosa. Neck: Supple, no elevated JVP or carotid bruits, no thyromegaly. Lungs: Clear to auscultation, nonlabored breathing at rest. Cardiac: Regular rate and rhythm, no S3 or significant systolic murmur, no pericardial rub. Abdomen: Soft, nontender, no hepatomegaly, bowel sounds present, no guarding or rebound. Extremities: No pitting edema, distal pulses 2+. Skin: Warm and dry. Musculoskeletal: No kyphosis. Neuropsychiatric: Alert and oriented x3, affect grossly appropriate.  ECG:  An ECG dated 06/12/2022 was personally reviewed today and demonstrated:  Sinus tach cardia  Recent Labwork: 11/26/2021: B Natriuretic Peptide 518.0 07/08/2022: TSH 0.800 09/01/2022: ALT 42; AST 58; BUN 18; Creatinine, Ser 1.38; Hemoglobin 12.0; Magnesium 2.0; Platelets 83; Potassium 4.2; Sodium 134     Component Value Date/Time   CHOL 167 09/09/2021 0111   TRIG 86 09/09/2021 0111   HDL 27 (L) 09/09/2021 0111   CHOLHDL 6.2 09/09/2021 0111   VLDL 17 09/09/2021 0111   LDLCALC 123 (H) 09/09/2021 0111    Other Studies Reviewed Today:   Assessment and Plan: Patient is a 67 year old M known to have  moderate to severe multivessel CAD on medical management with LVEF 45 to 50% and LV apical thrombus from 06/2022 on Coumadin, metastatic cholangiocarcinoma, DM 2 presented to cardiology clinic for follow-up visit.  # Moderate to severe multivessel CAD on medical management with LVEF 45 to 50%, currently angina free -Not on aspirin due to Coumadin -Continue atorvastatin 40 mg nightly -Continue carvedilol 6.25 mg twice daily -Start lisinopril 2.5 mg once daily -ER precautions for chest pain  # LV thrombus diagnosed in 06/2022 -Continue Coumadin -Echocardiogram from 08/2022 showed LVEF 45 to 50% but no contra study was performed to definitely rule out LV thrombus. Obtain limited echo with contrast to rule out LV thrombus.  Will need to stop Coumadin if there is no LV thrombus on the echo study and start aspirin 81 mg once daily. Also, will need to hold all anticoagulation and antiplatelet agents if platelet count drops to less than 50 K.  # HLD -Continue atorvastatin 40 mg nightly.  Goal LDL less than 70.  # Ischemic cardiomyopathy, LVEF 45 to 50% -Continue carvedilol 6.25 mg twice daily -Start lisinopril 2.5 mg once daily  # AKI likely secondary to cisplatin use versus CKD -Nephrology referral  # Nicotine abuse -Patient is trying to cut down smoking cigarettes and has Significantly per Wife.  I have spent a total of 33 minutes with patient reviewing chart , telemetry, EKGs, labs and examining patient as well as establishing an assessment and plan that was discussed with the patient.  > 50% of time was spent in direct patient care.     Medication Adjustments/Labs and Tests Ordered: Current medicines are reviewed at length with the patient today.  Concerns regarding medicines are outlined above.   Tests Ordered: Orders Placed This Encounter  Procedures   Ambulatory referral to Nephrology   ECHOCARDIOGRAM COMPLETE    Medication Changes: Meds ordered this encounter  Medications    lisinopril (ZESTRIL) 2.5 MG tablet    Sig: Take 1 tablet (2.5 mg total) by mouth daily.    Dispense:  90 tablet    Refill:  3    Disposition:  Follow up  6 months  Signed, Leatta Alewine Fidel Levy, MD, 09/11/2022 10:16 AM  [facility] [address], [place], [place] [address]

## 2022-09-11 NOTE — Telephone Encounter (Signed)
Spoke to patients wife and verbalized that complete echo had been canceled per provider and that patient needed limited echo. Patient's wife asked that it be scheduled either after the 19th or before. Patients wife okay with patient having Echo in [place].  Patient's wife had no further questions or concerns.

## 2022-09-11 NOTE — Patient Instructions (Signed)
Medication Instructions:  Your physician has recommended you make the following change in your medication:  -Start Lisinopril 2.5 mg tablets daily   Labwork: None  Testing/Procedures: Your physician has requested that you have an echocardiogram. Echocardiography is a painless test that uses sound waves to create images of your heart. It provides your doctor with information about the size and shape of your heart and how well your heart's chambers and valves are working. This procedure takes approximately one hour. There are no restrictions for this procedure. Please do NOT wear cologne, perfume, aftershave, or lotions (deodorant is allowed). Please arrive 15 minutes prior to your appointment time.   Follow-Up: Follow up with Dr. Dellia Cloud in 6 months.   Any Other Special Instructions Will Be Listed Below (If Applicable).  You have been referred to Nephrology. They will contact you with your first appointment.    If you need a refill on your cardiac medications before your next appointment, please call your pharmacy.

## 2022-09-11 NOTE — Addendum Note (Signed)
Addended by: Christella Scheuermann C on: 09/11/2022 12:05 PM   Modules accepted: Orders

## 2022-09-11 NOTE — Telephone Encounter (Signed)
Pt's wife is requesting return call in regards future tests.

## 2022-09-13 ENCOUNTER — Other Ambulatory Visit: Payer: Self-pay

## 2022-09-14 ENCOUNTER — Telehealth: Payer: Self-pay

## 2022-09-14 ENCOUNTER — Ambulatory Visit (HOSPITAL_COMMUNITY)
Admission: RE | Admit: 2022-09-14 | Discharge: 2022-09-14 | Disposition: A | Discharge status: Home / Self Care | Payer: Medicare Other | Source: Ambulatory Visit | Attending: Internal Medicine | Admitting: Internal Medicine

## 2022-09-14 DIAGNOSIS — I513 Intracardiac thrombosis, not elsewhere classified: Secondary | ICD-10-CM | POA: Diagnosis present

## 2022-09-14 LAB — ECHOCARDIOGRAM LIMITED: S' Lateral: 3.9 cm

## 2022-09-14 MED ORDER — PERFLUTREN LIPID MICROSPHERE
1.0000 mL | INTRAVENOUS | Status: AC | PRN
  Administered 2022-09-14: 5 mL via INTRAVENOUS

## 2022-09-14 NOTE — Telephone Encounter (Signed)
Spoke to patients wife (ok per DPR) who verbalized understanding. Patient wife had no questions or concerns at this time. Medication list updated to reflect medication changes. PCP copied.

## 2022-09-14 NOTE — Telephone Encounter (Signed)
-----   Message from Chalmers Guest, MD sent at 09/14/2022 12:04 PM EST ----- LVEF remains stable at 45-50%. No LV thrombus. Stop Coumadin.

## 2022-09-14 NOTE — Progress Notes (Signed)
  Echocardiogram 2D Echocardiogram has been performed.  Jonathan Keith 09/14/2022, 9:25 AM

## 2022-09-16 ENCOUNTER — Ambulatory Visit: Payer: Medicare Other

## 2022-09-16 ENCOUNTER — Other Ambulatory Visit: Payer: Medicare Other

## 2022-09-16 ENCOUNTER — Ambulatory Visit: Payer: Medicare Other | Admitting: Hematology

## 2022-09-21 ENCOUNTER — Ambulatory Visit (INDEPENDENT_AMBULATORY_CARE_PROVIDER_SITE_OTHER): Payer: Medicare Other | Admitting: Urology

## 2022-09-21 VITALS — BP 145/84 | HR 90

## 2022-09-21 DIAGNOSIS — N210 Calculus in bladder: Secondary | ICD-10-CM | POA: Diagnosis not present

## 2022-09-21 DIAGNOSIS — R39198 Other difficulties with micturition: Secondary | ICD-10-CM | POA: Diagnosis not present

## 2022-09-21 DIAGNOSIS — R310 Gross hematuria: Secondary | ICD-10-CM

## 2022-09-21 LAB — URINALYSIS, ROUTINE W REFLEX MICROSCOPIC
Bilirubin, UA: NEGATIVE
Glucose, UA: NEGATIVE
Ketones, UA: NEGATIVE
Leukocytes,UA: NEGATIVE
Nitrite, UA: NEGATIVE
Protein,UA: NEGATIVE
RBC, UA: NEGATIVE
Specific Gravity, UA: 1.01 (ref 1.005–1.030)
Urobilinogen, Ur: 0.2 mg/dL (ref 0.2–1.0)
pH, UA: 5 (ref 5.0–7.5)

## 2022-09-21 MED ORDER — CIPROFLOXACIN HCL 500 MG PO TABS
500.0000 mg | ORAL_TABLET | Freq: Once | ORAL | Status: AC
  Administered 2022-09-21: 500 mg via ORAL

## 2022-09-22 ENCOUNTER — Telehealth: Payer: Self-pay

## 2022-09-22 NOTE — Telephone Encounter (Signed)
I called patient to schedule surgery.   I spoke with Mrs. Jonathan Keith. We have discussed possible surgery dates and 10/22/2022 was agreed upon by all parties. Patient given information about surgery date, what to expect pre-operatively and post operatively.    We discussed that a pre-op nurse will be calling to set up the pre-op visit that will take place prior to surgery. Informed patient that our office will communicate any additional care to be provided after surgery.    Patients questions or concerns were discussed during our call. Advised to call our office should there be any additional information, questions or concerns that arise. Patient verbalized understanding.

## 2022-09-22 NOTE — Telephone Encounter (Signed)
Wife called back to reschedule surgery date for April. New date given 11/19/2022

## 2022-09-24 ENCOUNTER — Encounter: Payer: Self-pay | Admitting: Urology

## 2022-09-24 NOTE — Patient Instructions (Signed)
Bladder Stone  A bladder stone is a buildup of crystals made from the proteins and minerals found in urine. These substances build up when urine becomes too concentrated. Urine is concentrated when there is less water and more proteins and minerals in it. Bladder stones usually develop when a person has another medical condition that prevents the bladder from emptying completely. Crystals can form in the small amount of urine that is left in the bladder. Bladder stones that grow large can become painful and may block the flow of urine. What are the causes? This condition may be caused by: An enlarged prostate, which prevents the bladder from emptying well. An infection of a part of your urinary system (urinary tract infection, or UTI). This includes the: Kidneys. Bladder. Ureters. These are the tubes that carry urine to your bladder. Urethra. This is the tube that drains urine from your bladder. A weak spot in the bladder that creates a small pouch (bladder diverticulum). Nerve damage that may interfere with the signals from your brain to your bladder muscles (neurogenic bladder). This can result from conditions such as Parkinson's disease or spinal cord injuries. What increases the risk? This condition is more likely to develop in people who: Get frequent UTIs. Have another medical condition that affects the bladder. Have a history of bladder surgery. Have a spinal cord injury. Have an abnormal shape of the bladder (deformity). What are the signs or symptoms? Common symptoms of this condition include: Pain in the abdomen. A need to urinate more often. Difficulty or pain when urinating. Blood in the urine. Cloudy urine or urine that is dark in color. Pain in the penis or testicles in men. Small bladder stones do not always cause symptoms. How is this diagnosed? This condition may be diagnosed based on your symptoms, medical history, and physical exam. The physical exam will check for  tenderness in your abdomen. For men, an exam in the rectum may be done to check the prostate gland. You may have tests, such as: A urine test (urinalysis). A urine sample test to check for other infections (culture). Blood tests, including tests to look for a certain substance (creatinine). A creatinine level that is higher than normal could indicate a blockage. A procedure to check your bladder using a scope with a camera (cystoscopy). You may also have imaging studies, such as: CT scan or ultrasound of your abdomen and the area between your hip bones (pelvis or pelvic area). An X-ray of your urinary system. How is this treated? This condition may be treated with: Cystolitholapaxy. This procedure uses a laser, ultrasound, or other device to break the stone into smaller pieces. Fluids are used to flush the small pieces from the area. Surgery to remove the stone. A stent. This is a small mesh tube that is threaded into your ureter to make urine flow. Medicines to treat pain. Follow these instructions at home: Medicines Take over-the-counter and prescription medicines only as told by your health care provider. Ask your health care provider if the medicine prescribed to you: Requires you to avoid driving or using heavy machinery. Can cause constipation. You may need to take these actions to prevent or treat constipation: Take over-the-counter or prescription medicines. Eat foods that are high in fiber, such as beans, whole grains, and fresh fruits and vegetables. Limit foods that are high in fat and processed sugars, such as fried or sweet foods. Alcohol use Do not drink alcohol if: Your health care provider tells you not to  drink. You are pregnant, may be pregnant, or are planning to become pregnant. If you drink alcohol: Limit how much you drink to: 0-1 drink a day for women. 0-2 drinks a day for men. Be aware of how much alcohol is in your drink. In the [place] one drink equals one 12  oz bottle of beer (355 mL), one 5 oz glass of wine (148 mL), or one 1 oz glass of hard liquor (44 mL). Activity Rest as told by your health care provider. Return to your normal activities as told by your health care provider. Ask your health care provider what activities are safe for you. General instructions  Drink enough fluid to keep your urine pale yellow. Tell your health care provider about any unusual symptoms related to urinating. Early diagnosis of an enlarged prostate and other bladder conditions may reduce your risk of getting bladder stones. Do not use any products that contain nicotine or tobacco, such as cigarettes, e-cigarettes, or chewing tobacco. If you need help quitting, ask your health care provider. Do not use drugs. Where to find more information [facility]): www.urologyhealth.org Contact a health care provider if you: Have a fever. Feel nauseous or vomit. Are unable to urinate. Have a large amount of blood in your urine. Get help right away if you: Have severe back pain or pain in the lower part of your abdomen. Cannot eat or drink without vomiting. Vomit after taking your medicine. Summary A bladder stone is a buildup of crystals made from the proteins and minerals found in urine. These substances build up when urine becomes too concentrated. Bladder stones that grow large can become painful and may block the flow of urine. Bladder stones may be treated with a laser, a stent, surgery, or pain medicines. This information is not intended to replace advice given to you by your health care provider. Make sure you discuss any questions you have with your health care provider. Document Revised: 02/16/2019 Document Reviewed: 02/16/2019 Elsevier Patient Education  [employer].

## 2022-09-24 NOTE — Progress Notes (Signed)
   09/21/2022  CC: difficulty urinating   HPI: Mr Rog is a [phone] here for cystoscopy for difficulty urinating Blood pressure (!) 145/84, pulse 90. NED. A&Ox3.   No respiratory distress   Abd soft, NT, ND Normal phallus with bilateral descended testicles  Cystoscopy Procedure Note  Patient identification was confirmed, informed consent was obtained, and patient was prepped using Betadine solution.  Lidocaine jelly was administered per urethral meatus.     Pre-Procedure: - Inspection reveals a normal caliber ureteral meatus.  Procedure: The flexible cystoscope was introduced without difficulty - No urethral strictures/lesions are present. - Enlarged prostate  - Normal bladder neck - Bilateral ureteral orifices identified - Bladder mucosa  reveals no ulcers, tumors, or lesions - 1.5cm bladder calculus - No trabeculation     Post-Procedure: - Patient tolerated the procedure well  Assessment/ Plan: We discussed the management of bladder calculi including observation versus cystolithalopaxy and after discussing the options the patient elects for cystolithalopaxy. Risks/benefits/alternatives discussed  No follow-ups on file.  Nicolette Bang, MD

## 2022-09-28 ENCOUNTER — Inpatient Hospital Stay: Payer: Medicare Other

## 2022-09-28 ENCOUNTER — Other Ambulatory Visit (HOSPITAL_COMMUNITY): Payer: Medicare Other

## 2022-09-28 ENCOUNTER — Inpatient Hospital Stay (HOSPITAL_BASED_OUTPATIENT_CLINIC_OR_DEPARTMENT_OTHER): Payer: Medicare Other | Admitting: Hematology

## 2022-09-28 ENCOUNTER — Other Ambulatory Visit: Payer: Self-pay | Admitting: Physician Assistant

## 2022-09-28 ENCOUNTER — Inpatient Hospital Stay: Payer: Medicare Other | Attending: Hematology

## 2022-09-28 DIAGNOSIS — C221 Intrahepatic bile duct carcinoma: Secondary | ICD-10-CM | POA: Insufficient documentation

## 2022-09-28 DIAGNOSIS — N210 Calculus in bladder: Secondary | ICD-10-CM | POA: Insufficient documentation

## 2022-09-28 DIAGNOSIS — Z5112 Encounter for antineoplastic immunotherapy: Secondary | ICD-10-CM | POA: Diagnosis present

## 2022-09-28 DIAGNOSIS — Z79631 Long term (current) use of antimetabolite agent: Secondary | ICD-10-CM | POA: Insufficient documentation

## 2022-09-28 DIAGNOSIS — R634 Abnormal weight loss: Secondary | ICD-10-CM | POA: Diagnosis not present

## 2022-09-28 DIAGNOSIS — R7401 Elevation of levels of liver transaminase levels: Secondary | ICD-10-CM | POA: Insufficient documentation

## 2022-09-28 DIAGNOSIS — Z5111 Encounter for antineoplastic chemotherapy: Secondary | ICD-10-CM | POA: Insufficient documentation

## 2022-09-28 DIAGNOSIS — Z5986 Financial insecurity: Secondary | ICD-10-CM | POA: Insufficient documentation

## 2022-09-28 DIAGNOSIS — G8929 Other chronic pain: Secondary | ICD-10-CM | POA: Insufficient documentation

## 2022-09-28 DIAGNOSIS — I252 Old myocardial infarction: Secondary | ICD-10-CM | POA: Diagnosis not present

## 2022-09-28 DIAGNOSIS — Z95828 Presence of other vascular implants and grafts: Secondary | ICD-10-CM

## 2022-09-28 DIAGNOSIS — I5022 Chronic systolic (congestive) heart failure: Secondary | ICD-10-CM | POA: Diagnosis not present

## 2022-09-28 DIAGNOSIS — Z79630 Long term (current) use of alkylating agent: Secondary | ICD-10-CM | POA: Insufficient documentation

## 2022-09-28 DIAGNOSIS — E785 Hyperlipidemia, unspecified: Secondary | ICD-10-CM | POA: Insufficient documentation

## 2022-09-28 DIAGNOSIS — Z452 Encounter for adjustment and management of vascular access device: Secondary | ICD-10-CM | POA: Diagnosis not present

## 2022-09-28 DIAGNOSIS — Z79620 Long term (current) use of immunosuppressive biologic: Secondary | ICD-10-CM | POA: Insufficient documentation

## 2022-09-28 DIAGNOSIS — R102 Pelvic and perineal pain: Secondary | ICD-10-CM | POA: Insufficient documentation

## 2022-09-28 DIAGNOSIS — F17210 Nicotine dependence, cigarettes, uncomplicated: Secondary | ICD-10-CM | POA: Diagnosis not present

## 2022-09-28 DIAGNOSIS — I2510 Atherosclerotic heart disease of native coronary artery without angina pectoris: Secondary | ICD-10-CM | POA: Insufficient documentation

## 2022-09-28 DIAGNOSIS — Z7982 Long term (current) use of aspirin: Secondary | ICD-10-CM | POA: Diagnosis not present

## 2022-09-28 DIAGNOSIS — Z79899 Other long term (current) drug therapy: Secondary | ICD-10-CM | POA: Insufficient documentation

## 2022-09-28 DIAGNOSIS — Z8249 Family history of ischemic heart disease and other diseases of the circulatory system: Secondary | ICD-10-CM | POA: Insufficient documentation

## 2022-09-28 LAB — CBC WITH DIFFERENTIAL/PLATELET
Abs Immature Granulocytes: 0.02 10*3/uL (ref 0.00–0.07)
Basophils Absolute: 0 10*3/uL (ref 0.0–0.1)
Basophils Relative: 1 %
Eosinophils Absolute: 0.2 10*3/uL (ref 0.0–0.5)
Eosinophils Relative: 4 %
HCT: 33.2 % — ABNORMAL LOW (ref 39.0–52.0)
Hemoglobin: 11.4 g/dL — ABNORMAL LOW (ref 13.0–17.0)
Immature Granulocytes: 0 %
Lymphocytes Relative: 13 %
Lymphs Abs: 0.7 10*3/uL (ref 0.7–4.0)
MCH: 32.9 pg (ref 26.0–34.0)
MCHC: 34.3 g/dL (ref 30.0–36.0)
MCV: 95.7 fL (ref 80.0–100.0)
Monocytes Absolute: 0.7 10*3/uL (ref 0.1–1.0)
Monocytes Relative: 11 %
Neutro Abs: 4.2 10*3/uL (ref 1.7–7.7)
Neutrophils Relative %: 71 %
Platelets: 85 10*3/uL — ABNORMAL LOW (ref 150–400)
RBC: 3.47 MIL/uL — ABNORMAL LOW (ref 4.22–5.81)
RDW: 14.2 % (ref 11.5–15.5)
WBC: 5.9 10*3/uL (ref 4.0–10.5)
nRBC: 0 % (ref 0.0–0.2)

## 2022-09-28 LAB — COMPREHENSIVE METABOLIC PANEL
ALT: 69 U/L — ABNORMAL HIGH (ref 0–44)
AST: 91 U/L — ABNORMAL HIGH (ref 15–41)
Albumin: 2.7 g/dL — ABNORMAL LOW (ref 3.5–5.0)
Alkaline Phosphatase: 268 U/L — ABNORMAL HIGH (ref 38–126)
Anion gap: 9 (ref 5–15)
BUN: 18 mg/dL (ref 8–23)
CO2: 21 mmol/L — ABNORMAL LOW (ref 22–32)
Calcium: 8.5 mg/dL — ABNORMAL LOW (ref 8.9–10.3)
Chloride: 98 mmol/L (ref 98–111)
Creatinine, Ser: 1.39 mg/dL — ABNORMAL HIGH (ref 0.61–1.24)
GFR, Estimated: 56 mL/min — ABNORMAL LOW (ref >60)
Glucose, Bld: 149 mg/dL — ABNORMAL HIGH (ref 70–99)
Potassium: 4.4 mmol/L (ref 3.5–5.1)
Sodium: 128 mmol/L — ABNORMAL LOW (ref 135–145)
Total Bilirubin: 3.5 mg/dL — ABNORMAL HIGH (ref 0.3–1.2)
Total Protein: 6.6 g/dL (ref 6.5–8.1)

## 2022-09-28 LAB — MAGNESIUM: Magnesium: 2.1 mg/dL (ref 1.7–2.4)

## 2022-09-28 LAB — TSH: TSH: 0.843 u[IU]/mL (ref 0.350–4.500)

## 2022-09-28 MED ORDER — SODIUM CHLORIDE 0.9% FLUSH
10.0000 mL | Freq: Once | INTRAVENOUS | Status: AC
  Administered 2022-09-28: 10 mL via INTRAVENOUS

## 2022-09-28 NOTE — Progress Notes (Signed)
[facility] [address] [place] [address]    Clinic Day:  09/28/2022  Referring physician: Redmond School, MD  Patient Care Team: Redmond School, MD as PCP - General (Internal Medicine) Chalmers Guest, MD as PCP - Cardiology (Cardiology) Derek Jack, MD as Medical Oncologist (Medical Oncology)   ASSESSMENT & PLAN:   Assessment: Metastatic cholangiocarcinoma: - 02/2021 initial diagnosis presentation with painless jaundice - ERCP with sphincterotomy, subsequent ERCP with plastic biliary stent placement. - MRI-2 cm mass involving the common hepatic duct with upstream biliary dilatation. - 03/2021 consult at [facility] with GI and surgery-confirm diagnosis of perihilar cholangiocarcinoma - 03/21/2021-biopsy/pathology, ERCP-common hepatic duct cytology and brushings performed.  EUS-1 enlarged lymph node versus tumor implant in the gastrohepatic ligament-negative for malignancy.  Single biliary plastic stent removed and replaced with the plastic stent in the right and left ducts. - 04/17/2021-right portal vein embolization to promote left hepatic hypertrophy. - 07/02/2021-diagnostic laparoscopy with multiple areas concerning for metastatic blocks identified in the abdomen and peritoneal surfaces.  6 separate biopsies obtained. - Pathology consistent with metastatic carcinoma. - 37 pound weight loss in the last 5 months. - Cycle 1 of gemcitabine, cisplatin, durvalumab on 07/22/2021.  2.  Social history/family: - Lives at home with his wife.  He worked as a Development worker, community. - Current active smoker, 1 pack/day since age 56. - Maternal grandmother had cancer.  Maternal uncle had brain tumor.    Plan: Metastatic cholangiocarcinoma: - CT CAP on 08/28/2022: Haziness to the omentum and mesentery particularly in the anterior abdomen and right upper quadrant with nodular contours concerning for development of peritoneal carcinomatosis.  Liver lesions are  stable. - His port needs to be discontinued as skin has eroded on the surface.  We will request IR to place another port on the left side. - She reports some suprapubic pain.  Recent cystoscopy on 09/21/2022 showed 1.5 cm bladder stone which is likely contributing to it.  He is scheduled for lithotripsy on 11/19/2022. - I have reviewed 2D echocardiogram from 09/14/2022 which did not show LV thrombus. - I reviewed labs today which showed elevated AST and ALT of 91 and 69.  Total bilirubin is 3.5.  CBC shows platelet count 85. - Will hold treatment today.  Will likely start him back on cisplatin and gemcitabine with durvalumab after the port is placed.  Will closely monitor his total bilirubin.  If it continues to go high, consider imaging of the liver with MRCP. - Will send NGS testing.    2.  Weight loss: - He is eating well and continue to gain weight.  3.  Left ventricular thrombus (Dx 06/04/2022):: - Coumadin discontinued by cardiology on 09/14/2022.  Recommend starting aspirin 81 mg daily.  4.  Hypotension: - Blood pressure is 120/73.  He is off of midodrine.  Orders Placed This Encounter  Procedures   CT CHEST ABDOMEN PELVIS W CONTRAST    Standing Status:   Future    Standing Expiration Date:   09/28/2023    Order Specific Question:   If indicated for the ordered procedure, I authorize the administration of contrast media per Radiology protocol    Answer:   Yes    Order Specific Question:   Does the patient have a contrast media/X-ray dye allergy?    Answer:   No    Order Specific Question:   Preferred imaging location?    Answer:   [facility]    Order Specific  Question:   Is Oral Contrast requested for this exam?    Answer:   Yes, Per Radiology protocol   IR Removal Tun Access W/ Port W/O FL    Standing Status:   Future    Standing Expiration Date:   09/29/2023    Order Specific Question:   Reason for Exam (SYMPTOM  OR DIAGNOSIS REQUIRED)    Answer:   skin breakdown over  port a cath site - port hub exposed    Order Specific Question:   Preferred Imaging Location?    Answer:   [facility]   IR Port Repair Central Venous Access Device    Port is breaking down and skin is exposed. Per Dr. Serafina Royals, schedule port revision.    Standing Status:   Future    Standing Expiration Date:   09/29/2023    Order Specific Question:   Reason for Exam (SYMPTOM  OR DIAGNOSIS REQUIRED)    Answer:   chemotherapy administration    Order Specific Question:   Preferred Imaging Location?    Answer:   [facility]      Beverly Gust Oliver,acting as a scribe for Derek Jack, MD.,have documented all relevant documentation on the behalf of Derek Jack, MD,as directed by  Derek Jack, MD while in the presence of Derek Jack, MD.   I, Derek Jack MD, have reviewed the above documentation for accuracy and completeness, and I agree with the above.   Derek Jack, MD   2/19/20246:58 PM  CHIEF COMPLAINT:   Diagnosis: intrahepatic cholangiocarcinoma    Cancer Staging  Cholangiocarcinoma [facility]) Staging form: Perihilar Bile Ducts, AJCC 8th Edition - Clinical stage from 07/17/2021: Stage IVB (cTX, cNX, pM1) - Signed by Derek Jack, MD on 07/17/2021    Prior Therapy: None  Current Therapy:  Cisplatin + Gemcitabine D1, D15 q 28d    HISTORY OF PRESENT ILLNESS:   Oncology History  Cholangiocarcinoma ([place])  02/28/2021 Initial Diagnosis   Cholangiocarcinoma ([place])   07/17/2021 Cancer Staging   Staging form: Perihilar Bile Ducts, AJCC 8th Edition - Clinical stage from 07/17/2021: Stage IVB (cTX, cNX, pM1) - Signed by Derek Jack, MD on 07/17/2021 Stage prefix: Initial diagnosis   07/22/2021 - 04/01/2022 Chemotherapy   Patient is on Treatment Plan : BILIARY TRACT Cisplatin + Gemcitabine D1,15 + Imfinzi day 1 q28d     07/22/2021 -  Chemotherapy   Patient is on Treatment Plan : BILIARY TRACT IMFINZI D1  +   Cisplatin + Gemcitabine  D1, 15 q28d        INTERVAL HISTORY:   Samael is a 67 y.o. male presenting to clinic today for follow up of intrahepatic cholangiocarcinoma . He was last seen by me on 09/01/2022.  Today, he states that he is doing well overall. His appetite level is at 50%. His energy level is at 0%.he complains of suprapubic pain for the last 1 week.  He currently has a bladder stone that will be removed. He also has a purplish bruise around his port.   PAST MEDICAL HISTORY:   Past Medical History: Past Medical History:  Diagnosis Date   Cancer ([place])    CHF (congestive heart failure) (HCC)    Chronic pain    neck, back, knees   Class 1 obesity due to excess calories with body mass index (BMI) of 31.0 to 31.9 in adult    Claudication [facility])    Coronary artery disease    DDD (degenerative disc disease), cervical  Diabetes mellitus ([place])    Dyslipidemia    Hypertension    Port-A-Cath in place 07/20/2021   Tobacco dependence     Surgical History: Past Surgical History:  Procedure Laterality Date   BILIARY BRUSHING N/A 02/25/2021   Procedure: BILIARY BRUSHING;  Surgeon: Rogene Houston, MD;  Location: [facility];  Service: Gastroenterology;  Laterality: N/A;   BILIARY STENT PLACEMENT N/A 02/25/2021   Procedure: BILIARY STENT PLACEMENT;  Surgeon: Rogene Houston, MD;  Location: [facility];  Service: Gastroenterology;  Laterality: N/A;   BILIARY STENT PLACEMENT  02/27/2021   Procedure: BILIARY STENT PLACEMENT (10FR x 9cm) IN THE RIGHT SYSTEM;  Surgeon: Rogene Houston, MD;  Location: [facility];  Service: Gastroenterology;;   BREAST SURGERY Left    benign lump- in his 39s.   CARDIAC CATHETERIZATION     ERCP N/A 02/25/2021   Procedure: ENDOSCOPIC RETROGRADE CHOLANGIOPANCREATOGRAPHY (ERCP);  Surgeon: Rogene Houston, MD;  Location: [facility];  Service: Gastroenterology;  Laterality: N/A;   ERCP N/A 02/27/2021   Procedure: ENDOSCOPIC RETROGRADE CHOLANGIOPANCREATOGRAPHY (ERCP);   Surgeon: Rogene Houston, MD;  Location: [facility];  Service: Gastroenterology;  Laterality: N/A;   IR IMAGING GUIDED PORT INSERTION  07/21/2021   KNEE SURGERY Left    x2   LEFT HEART CATH AND CORONARY ANGIOGRAPHY N/A 09/08/2021   Procedure: LEFT HEART CATH AND CORONARY ANGIOGRAPHY;  Surgeon: Early Osmond, MD;  Location: [place] [facility];  Service: Cardiovascular;  Laterality: N/A;   SPHINCTEROTOMY N/A 02/25/2021   Procedure: SPHINCTEROTOMY;  Surgeon: Rogene Houston, MD;  Location: [facility];  Service: Gastroenterology;  Laterality: N/A;   STENT REMOVAL  02/27/2021   Procedure: STENT REMOVAL (8.5Fr x 9cm);  Surgeon: Rogene Houston, MD;  Location: [facility];  Service: Gastroenterology;;    Social History: Social History   Socioeconomic History   Marital status: Married    Spouse name: Not on file   Number of children: Not on file   Years of education: Not on file   Highest education level: Not on file  Occupational History   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  Tobacco Use   Smoking status: Every Day    Packs/day: 0.50    Years: 56.00    Total pack years: 28.00    Types: Cigarettes   Smokeless tobacco: Never  Vaping Use   Vaping Use: Never used  Substance and Sexual Activity   Alcohol use: Not Currently    Comment: stopped 2.5 years ago 03/11/21   Drug use: Never   Sexual activity: Not on file  Other Topics Concern   Not on file  Social History Narrative   Not on file   Social Determinants of Health   Financial Resource Strain: Medium Risk (03/07/2021)   Overall Financial Resource Strain (CARDIA)    Difficulty of Paying Living Expenses: Somewhat hard  Food Insecurity: No Food Insecurity (03/07/2021)   Hunger Vital Sign    Worried About Running Out of Food in the Last Year: Never true    [place] in the Last Year: Never true  Transportation Needs: No Transportation Needs (03/07/2021)   PRAPARE - Hydrologist (Medical): No    Lack of  Transportation (Non-Medical): No  Physical Activity: Insufficiently Active (03/07/2021)   Exercise Vital Sign    Days of Exercise per Week: 2 days    Minutes of Exercise per Session: 20 min  Stress: No Stress Concern Present (03/07/2021)   [place]  Institute of Occupational Health - Occupational Stress Questionnaire    Feeling of Stress : Only a little  Social Connections: Moderately Integrated (03/07/2021)   Social Connection and Isolation Panel [NHANES]    Frequency of Communication with Friends and Family: More than three times a week    Frequency of Social Gatherings with Friends and Family: More than three times a week    Attends Religious Services: More than 4 times per year    Active Member of [employer] or Organizations: No    Attends Archivist Meetings: Never    Marital Status: Married  Human resources officer Violence: Not At Risk (03/07/2021)   Humiliation, Afraid, Rape, and Kick questionnaire    Fear of Current or Ex-Partner: No    Emotionally Abused: No    Physically Abused: No    Sexually Abused: No    Family History: Family History  Problem Relation Age of Onset   CAD Mother    Cancer Neg Hx     Current Medications:  Current Outpatient Medications:    acetaminophen (TYLENOL) 500 MG tablet, Take 500 mg by mouth every 6 (six) hours as needed for moderate pain or headache., Disp: , Rfl:    albuterol (VENTOLIN HFA) 108 (90 Base) MCG/ACT inhaler, Inhale 2 puffs into the lungs every 6 (six) hours as needed for wheezing or shortness of breath., Disp: 6.7 g, Rfl: 1   alfuzosin (UROXATRAL) 10 MG 24 hr tablet, Take 1 tablet (10 mg total) by mouth at bedtime., Disp: 30 tablet, Rfl: 11   atorvastatin (LIPITOR) 40 MG tablet, Take 1 tablet (40 mg total) by mouth daily at 6 PM., Disp: 90 tablet, Rfl: 3   carvedilol (COREG) 6.25 MG tablet, Take 1 tablet (6.25 mg total) by mouth 2 (two) times daily., Disp: 180 tablet, Rfl: 3   cephALEXin (KEFLEX) 500 MG capsule, Take 500 mg by mouth  every 6 (six) hours., Disp: , Rfl:    CISPLATIN IV, Inject into the vein once a week. Days 1, 8 every 21 days, Disp: , Rfl:    Durvalumab (IMFINZI IV), Inject into the vein every 21 ( twenty-one) days., Disp: , Rfl:    famotidine (PEPCID) 20 MG tablet, Take 20 mg by mouth 2 (two) times daily., Disp: , Rfl:    ferrous sulfate (FERROUSUL) 325 (65 FE) MG tablet, Take 1 tablet (325 mg total) by mouth daily with breakfast., Disp: , Rfl:    Gemcitabine HCl (GEMZAR IV), Inject into the vein once a week. Days 1, 8 every 21 days, Disp: , Rfl:    Ginseng 100 MG CAPS, Take 1 capsule by mouth daily., Disp: , Rfl:    lidocaine-prilocaine (EMLA) cream, , Disp: , Rfl:    lisinopril (ZESTRIL) 2.5 MG tablet, Take 1 tablet (2.5 mg total) by mouth daily., Disp: 90 tablet, Rfl: 3   loratadine (CLARITIN) 10 MG tablet, Take 10 mg by mouth daily. Takes for a few days after shot to boost WBC. About every 2nd treatment, Disp: , Rfl:    pantoprazole (PROTONIX) 40 MG tablet, Take 1 tablet (40 mg total) by mouth daily., Disp: 90 tablet, Rfl: 3   tamsulosin (FLOMAX) 0.4 MG CAPS capsule, TAKE (2) CAPSULES BY MOUTH AT BEDTIME., Disp: 60 capsule, Rfl: 6   Vitamin D, Ergocalciferol, (DRISDOL) 1.25 MG (50000 UNIT) CAPS capsule, Take 1 capsule by mouth once a week., Disp: , Rfl:    metFORMIN (GLUCOPHAGE) 500 MG tablet, Take 1 tablet (500 mg total) by mouth 2 (two) times  daily with a meal., Disp: 60 tablet, Rfl: 0   nitroGLYCERIN (NITROSTAT) 0.4 MG SL tablet, Place 1 tablet (0.4 mg total) under the tongue every 5 (five) minutes as needed for chest pain., Disp: 25 tablet, Rfl: 3   prochlorperazine (COMPAZINE) 10 MG tablet, TAKE (1) TABLET BY MOUTH EVERY (6) HOURS AS NEEDED., Disp: 90 tablet, Rfl: 0   Allergies: No Known Allergies  REVIEW OF SYSTEMS:   Review of Systems  Constitutional:  Negative for chills, fatigue and fever.  HENT:   Negative for lump/mass, mouth sores, nosebleeds, sore throat and trouble swallowing.   Eyes:   Negative for eye problems.  Respiratory:  Negative for cough and shortness of breath.   Cardiovascular:  Negative for chest pain, leg swelling and palpitations.  Gastrointestinal:  Negative for abdominal pain, constipation, diarrhea, nausea and vomiting.  Genitourinary:  Positive for difficulty urinating (Bladder Stone). Negative for bladder incontinence, dysuria, frequency, hematuria and nocturia.   Musculoskeletal:  Negative for arthralgias, back pain, flank pain, myalgias and neck pain.  Skin:  Negative for itching and rash.  Neurological:  Negative for dizziness, headaches and numbness.  Hematological:  Does not bruise/bleed easily.  Psychiatric/Behavioral:  Positive for sleep disturbance (Falling/Staying). Negative for depression and suicidal ideas. The patient is not nervous/anxious.   All other systems reviewed and are negative.    VITALS:   There were no vitals taken for this visit.  Wt Readings from Last 3 Encounters:  09/28/22 158 lb 12.8 oz (72 kg)  09/11/22 158 lb (71.7 kg)  09/01/22 157 lb 6.4 oz (71.4 kg)    There is no height or weight on file to calculate BMI.  Performance status (ECOG): 1 - Symptomatic but completely ambulatory  PHYSICAL EXAM:   Physical Exam Vitals and nursing note reviewed. Exam conducted with a chaperone present.  Constitutional:      Appearance: Normal appearance.  Cardiovascular:     Rate and Rhythm: Normal rate and regular rhythm.     Pulses: Normal pulses.     Heart sounds: Normal heart sounds.  Pulmonary:     Effort: Pulmonary effort is normal.     Breath sounds: Normal breath sounds.  Abdominal:     Palpations: Abdomen is soft. There is no hepatomegaly, splenomegaly or mass.     Tenderness: There is no abdominal tenderness.  Musculoskeletal:     Right lower leg: No edema.     Left lower leg: No edema.  Lymphadenopathy:     Cervical: No cervical adenopathy.     Right cervical: No superficial, deep or posterior cervical  adenopathy.    Left cervical: No superficial, deep or posterior cervical adenopathy.     Upper Body:     Right upper body: No supraclavicular or axillary adenopathy.     Left upper body: No supraclavicular or axillary adenopathy.  Neurological:     General: No focal deficit present.     Mental Status: He is alert and oriented to person, place, and time.  Psychiatric:        Mood and Affect: Mood normal.        Behavior: Behavior normal.     LABS:      Latest Ref Rng & Units 09/28/2022   10:04 AM 09/01/2022    9:15 AM 08/05/2022    8:10 AM  CBC  WBC 4.0 - 10.5 K/uL 5.9  5.0  4.8   Hemoglobin 13.0 - 17.0 g/dL 11.4  12.0  11.2   Hematocrit 39.0 -  52.0 % 33.2  35.7  32.9   Platelets 150 - 400 K/uL 85  83  76       Latest Ref Rng & Units 09/28/2022   10:04 AM 09/01/2022    9:15 AM 08/05/2022    8:10 AM  CMP  Glucose 70 - 99 mg/dL 149  135  144   BUN 8 - 23 mg/dL 18  18  12   $ Creatinine 0.61 - 1.24 mg/dL 1.39  1.38  1.25   Sodium 135 - 145 mmol/L 128  134  133   Potassium 3.5 - 5.1 mmol/L 4.4  4.2  4.3   Chloride 98 - 111 mmol/L 98  103  103   CO2 22 - 32 mmol/L 21  23  23   $ Calcium 8.9 - 10.3 mg/dL 8.5  8.6  8.3   Total Protein 6.5 - 8.1 g/dL 6.6  6.9  6.5   Total Bilirubin 0.3 - 1.2 mg/dL 3.5  1.4  1.3   Alkaline Phos 38 - 126 U/L 268  263  252   AST 15 - 41 U/L 91  58  65   ALT 0 - 44 U/L 69  42  47      Lab Results  Component Value Date   CEA1 3.9 07/17/2021   /  CEA  Date Value Ref Range Status  07/17/2021 3.9 0.0 - 4.7 ng/mL Final    Comment:    (NOTE)                             Nonsmokers          <3.9                             Smokers             <5.6 Roche [employer] Electrochemiluminescence Immunoassay (ECLIA) Values obtained with different assay methods or kits cannot be used interchangeably.  Results cannot be interpreted as absolute evidence of the presence or absence of malignant disease. Performed At: [facility] [place], [place] [id number] Rush Farmer MD [id number]    No results found for: "PSA1" Lab Results  Component Value Date   [id number] (H) 09/01/2022   No results found for: "CAN125"  No results found for: "TOTALPROTELP", "ALBUMINELP", "A1GS", "A2GS", "BETS", "BETA2SER", "GAMS", "MSPIKE", "SPEI" Lab Results  Component Value Date   TIBC 139 (L) 09/12/2021   TIBC 154 (L) 03/13/2021   FERRITIN 1,132 (H) 09/12/2021   FERRITIN 708 (H) 03/13/2021   IRONPCTSAT 16 (L) 09/12/2021   IRONPCTSAT 17 (L) 03/13/2021   No results found for: "LDH"   STUDIES:   ECHOCARDIOGRAM LIMITED  Result Date: 09/14/2022    ECHOCARDIOGRAM LIMITED REPORT   Patient Name:   [name] Date of Exam: 09/14/2022 Medical Rec #:  [id number]       Height:       71.0 in Accession #:    [id number]      Weight:       158.0 lb Date of Birth:  10-Oct-1955       BSA:          1.908 m Patient Age:    43 years        BP:           118/70 mmHg Patient Gender: M  HR:           76 bpm. Exam Location:  [facility] Procedure: Limited Echo and Intracardiac Opacification Agent Indications:    Thrombus in heart chamber [id number]  History:        Patient has prior history of Echocardiogram examinations, most                 recent 09/09/2022. Cardiomyopathy, Thrombus and CHF, CAD and                 NSTEMI; Risk Factors:Hypertension, Current Smoker, Dyslipidemia                 and Diabetes.  Sonographer:    Greer Pickerel Referring Phys: [id number] VISHNU P MALLIPEDDI  Sonographer Comments: Image acquisition challenging due to respiratory motion. IMPRESSIONS  1. 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 The very distal apex is akinetic. There is dense slow moving circulating blood flow in the apex without clear apical thrombus. Left ventricular ejection fraction, by estimation, is 45 to 50%.  The left ventricle has mildly decreased function. The left ventricle demonstrates regional wall motion abnormalities (see scoring  diagram/findings for description).  2. 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 The very distal apex is akinetic. There is dense slow moving circulating blood flow in the apex without clear apical thrombus. Left ventricular ejection fraction, by estimation, is 45 to 50%. The left ventricle has mildly decreased function. The left ventricle demonstrates regional wall motion abnormalities. Definity contrast agent was given IV to delineate the left ventricular endocardial borders. LEFT VENTRICLE PLAX 2D LVIDd:         5.20 cm LVIDs:         3.90 cm LV PW:         1.00 cm LV IVS:        1.00 cm  Carlyle Dolly MD Electronically signed by Carlyle Dolly MD Signature Date/Time: 09/14/2022/10:16:14 AM    Final    ECHOCARDIOGRAM COMPLETE  Result Date: 09/09/2022    ECHOCARDIOGRAM REPORT   Patient Name:   [name] Date of Exam: 09/09/2022 Medical Rec #:  [id number]       Height:       69.0 in Accession #:    [id number]      Weight:       157.4 lb Date of Birth:  [birth year]       BSA:          1.866 m Patient Age:    30 years        BP:           114/73 mmHg Patient Gender: M               HR:           74 bpm. Exam Location:  [facility] Procedure: 2D Echo, Cardiac Doppler and Color Doppler Indications:    I50.22 (ICD-10-CM) - Chronic systolic heart failure  History:        Patient has prior history of Echocardiogram examinations, most                 recent 06/12/2022. CHF, Previous Myocardial Infarction; Risk                 Factors:Hypertension, Diabetes, Dyslipidemia and Current Smoker.  Hx of Left ventricular thrombus, Port-A-Cath in place,                 Cholangiocarcinoma.  Sonographer:    Alvino Chapel RCS Referring Phys: [id number] [place]  1. Left ventricular ejection fraction, by estimation, is 45 to 50%. The left ventricle has mildly decreased function. Left ventricular endocardial border  not optimally defined to evaluate regional wall motion. Left ventricular diastolic parameters are consistent with Grade I diastolic dysfunction (impaired relaxation).  2. Right ventricular systolic function was not well visualized. The right ventricular size is normal. Tricuspid regurgitation signal is inadequate for assessing PA pressure.  3. The mitral valve is normal in structure. Trivial mitral valve regurgitation. No evidence of mitral stenosis.  4. The aortic valve is tricuspid. There is mild calcification of the aortic valve. Aortic valve regurgitation is not visualized. No aortic stenosis is present.  5. The inferior vena cava is normal in size with greater than 50% respiratory variability, suggesting right atrial pressure of 3 mmHg. Comparison(s): No significant change from prior study. FINDINGS  Left Ventricle: Left ventricular ejection fraction, by estimation, is 45 to 50%. The left ventricle has mildly decreased function. Left ventricular endocardial border not optimally defined to evaluate regional wall motion. The left ventricular internal cavity size was normal in size. There is no left ventricular hypertrophy. Left ventricular diastolic parameters are consistent with Grade I diastolic dysfunction (impaired relaxation). Right Ventricle: The right ventricular size is normal. No increase in right ventricular wall thickness. Right ventricular systolic function was not well visualized. Tricuspid regurgitation signal is inadequate for assessing PA pressure. Left Atrium: Left atrial size was normal in size. Right Atrium: Right atrial size was normal in size. Pericardium: Trivial pericardial effusion is present. Mitral Valve: The mitral valve is normal in structure. Trivial mitral valve regurgitation. No evidence of mitral valve stenosis. Tricuspid Valve: The tricuspid valve is not well visualized. Tricuspid valve regurgitation is not demonstrated. No evidence of tricuspid stenosis. Aortic Valve: The aortic  valve is tricuspid. There is mild calcification of the aortic valve. Aortic valve regurgitation is not visualized. No aortic stenosis is present. Pulmonic Valve: The pulmonic valve was not well visualized. Pulmonic valve regurgitation is not visualized. No evidence of pulmonic stenosis. Aorta: The aortic root is normal in size and structure. Venous: The inferior vena cava is normal in size with greater than 50% respiratory variability, suggesting right atrial pressure of 3 mmHg. IAS/Shunts: No atrial level shunt detected by color flow Doppler.  LEFT VENTRICLE PLAX 2D LVIDd:         5.25 cm   Diastology LVIDs:         4.05 cm   LV e' medial:    4.46 cm/s LV PW:         1.05 cm   LV E/e' medial:  9.0 LV IVS:        0.95 cm   LV e' lateral:   8.16 cm/s LVOT diam:     2.20 cm   LV E/e' lateral: 4.9 LV SV:         63 LV SV Index:   34 LVOT Area:     3.80 cm  RIGHT VENTRICLE RV S prime:     6.96 cm/s TAPSE (M-mode): 1.5 cm LEFT ATRIUM             Index        RIGHT ATRIUM           Index LA  diam:        3.40 cm 1.82 cm/m   RA Area:     16.50 cm LA Vol (A2C):   49.4 ml 26.47 ml/m  RA Volume:   42.10 ml  22.56 ml/m LA Vol (A4C):   44.5 ml 23.85 ml/m LA Biplane Vol: 50.3 ml 26.96 ml/m  AORTIC VALVE LVOT Vmax:   72.30 cm/s LVOT Vmean:  47.300 cm/s LVOT VTI:    0.166 m  AORTA Ao Root diam: 3.60 cm MITRAL VALVE MV Area (PHT): 2.22 cm    SHUNTS MV Decel Time: 342 msec    Systemic VTI:  0.17 m MV E velocity: 40.10 cm/s  Systemic Diam: 2.20 cm MV A velocity: 64.60 cm/s MV E/A ratio:  0.62 Vishnu Priya Mallipeddi Electronically signed by Lorelee Cover Mallipeddi Signature Date/Time: 09/09/2022/5:35:23 PM    Final

## 2022-09-28 NOTE — Progress Notes (Signed)
Patient presents today for Port flush labs with possible treatment.  Port accessed, three open areas noted with exposed port and drainage.  MD notified.  Port covered with nonstick Tegaderm.    Labs drawn peripherally.  Patient tolerated well.  MD to access port during office visit.

## 2022-09-28 NOTE — Progress Notes (Signed)
Patient seen in doctor appointment today. No treatment per possible infected port per MD.

## 2022-09-28 NOTE — Patient Instructions (Signed)
[place] at [facility] Discharge Instructions   You were seen and examined today by Dr. Delton Coombes.  He reviewed the results of your lab work which are normal/stable.   We will proceed with your treatment today.   We will refer you back to interventional radiology to have your current port removed due to the skin breakdown over the site and the port being exposed. We will arrange for them to place a new port when you are there to have your current port removed.   Return as scheduled.    Thank you for choosing [place] at [facility] to provide your oncology and hematology care.  To afford each patient quality time with our provider, please arrive at least 15 minutes before your scheduled appointment time.   If you have a lab appointment with the [facility] please come in thru the Main Entrance and check in at the main information desk.  You need to re-schedule your appointment should you arrive 10 or more minutes late.  We strive to give you quality time with our providers, and arriving late affects you and other patients whose appointments are after yours.  Also, if you no show three or more times for appointments you may be dismissed from the clinic at the providers discretion.     Again, thank you for choosing [facility].  Our hope is that these requests will decrease the amount of time that you wait before being seen by our physicians.       _____________________________________________________________  Should you have questions after your visit to [facility], please contact our office at [phone] and follow the prompts.  Our office hours are 8:00 a.m. and 4:30 p.m. Monday - Friday.  Please note that voicemails left after 4:00 p.m. may not be returned until the following business day.  We are closed weekends and major holidays.  You do have access to a nurse 24-7, just call the main number to the clinic  [phone] and do not press any options, hold on the line and a nurse will answer the phone.    For prescription refill requests, have your pharmacy contact our office and allow 72 hours.    Due to Covid, you will need to wear a mask upon entering the hospital. If you do not have a mask, a mask will be given to you at the Main Entrance upon arrival. For doctor visits, patients may have 1 support person age 36 or older with them. For treatment visits, patients can not have anyone with them due to social distancing guidelines and our immunocompromised population.

## 2022-09-29 ENCOUNTER — Ambulatory Visit: Payer: Medicare Other | Admitting: Internal Medicine

## 2022-09-29 ENCOUNTER — Other Ambulatory Visit: Payer: Self-pay | Admitting: Radiology

## 2022-09-29 ENCOUNTER — Ambulatory Visit: Payer: Medicare Other | Admitting: Cardiovascular Disease

## 2022-09-30 ENCOUNTER — Ambulatory Visit (HOSPITAL_COMMUNITY)
Admission: RE | Admit: 2022-09-30 | Discharge: 2022-09-30 | Disposition: A | Discharge status: Home / Self Care | Payer: Medicare Other | Source: Ambulatory Visit | Attending: Hematology | Admitting: Hematology

## 2022-09-30 ENCOUNTER — Other Ambulatory Visit: Payer: Self-pay | Admitting: Hematology

## 2022-09-30 ENCOUNTER — Other Ambulatory Visit: Payer: Self-pay

## 2022-09-30 DIAGNOSIS — Z95828 Presence of other vascular implants and grafts: Secondary | ICD-10-CM

## 2022-09-30 DIAGNOSIS — I509 Heart failure, unspecified: Secondary | ICD-10-CM | POA: Diagnosis not present

## 2022-09-30 DIAGNOSIS — I2510 Atherosclerotic heart disease of native coronary artery without angina pectoris: Secondary | ICD-10-CM | POA: Insufficient documentation

## 2022-09-30 DIAGNOSIS — E119 Type 2 diabetes mellitus without complications: Secondary | ICD-10-CM | POA: Diagnosis not present

## 2022-09-30 DIAGNOSIS — I110 Hypertensive heart disease with heart failure: Secondary | ICD-10-CM | POA: Insufficient documentation

## 2022-09-30 DIAGNOSIS — F17210 Nicotine dependence, cigarettes, uncomplicated: Secondary | ICD-10-CM | POA: Insufficient documentation

## 2022-09-30 DIAGNOSIS — C221 Intrahepatic bile duct carcinoma: Secondary | ICD-10-CM

## 2022-09-30 HISTORY — PX: IR REMOVAL TUN ACCESS W/ PORT W/O FL MOD SED: IMG2290

## 2022-09-30 LAB — GLUCOSE, CAPILLARY: Glucose-Capillary: 96 mg/dL (ref 70–99)

## 2022-09-30 LAB — CANCER ANTIGEN 19-9: CA 19-9: 742 U/mL — ABNORMAL HIGH (ref 0–35)

## 2022-09-30 MED ORDER — FENTANYL CITRATE (PF) 100 MCG/2ML IJ SOLN
INTRAMUSCULAR | Status: AC
  Filled 2022-09-30: qty 2

## 2022-09-30 MED ORDER — MIDAZOLAM HCL 2 MG/2ML IJ SOLN
INTRAMUSCULAR | Status: AC
  Filled 2022-09-30: qty 2

## 2022-09-30 MED ORDER — SODIUM CHLORIDE 0.9 % IV SOLN: INTRAVENOUS | Status: DC

## 2022-09-30 MED ORDER — MIDAZOLAM HCL 2 MG/2ML IJ SOLN
INTRAMUSCULAR | Status: AC | PRN
  Administered 2022-09-30: 1 mg via INTRAVENOUS
  Administered 2022-09-30: .5 mg via INTRAVENOUS

## 2022-09-30 MED ORDER — LIDOCAINE-EPINEPHRINE 1 %-1:100000 IJ SOLN
INTRAMUSCULAR | Status: AC
  Filled 2022-09-30: qty 1

## 2022-09-30 MED ORDER — FENTANYL CITRATE (PF) 100 MCG/2ML IJ SOLN
INTRAMUSCULAR | Status: AC | PRN
  Administered 2022-09-30 (×3): 25 ug via INTRAVENOUS

## 2022-09-30 MED ORDER — HEPARIN SOD (PORK) LOCK FLUSH 100 UNIT/ML IV SOLN
INTRAVENOUS | Status: AC
  Administered 2022-09-30: 100 [IU] via INTRAVENOUS
  Filled 2022-09-30: qty 5

## 2022-09-30 NOTE — H&P (Signed)
Chief Complaint: Patient was seen in consultation today for metastatic cholangiocarcinoma with port-a-catheter in place  Referring Physician(s): Katragadda,Sreedhar  Supervising Physician: Daryll Brod  Patient Status: [facility] - Out-pt  History of Present Illness: Jonathan Keith is a 67 y.o. male with a medical history significant for CHF, CAD, DM, and metastatic cholangiocarcinoma. He is receiving treatment via a port-a-catheter placed in IR 07/21/21. The patient's oncology team recently noted the port is breaking down with skin erosion over the site.   Interventional Radiology has been asked to evaluate this patient's port-a-catheter. Imaging reviewed and procedure (port removal with placement of PICC line) approved by Dr. Annamaria Boots.   Past Medical History:  Diagnosis Date   Cancer ([place])    CHF (congestive heart failure) (HCC)    Chronic pain    neck, back, knees   Class 1 obesity due to excess calories with body mass index (BMI) of 31.0 to 31.9 in adult    Claudication [facility])    Coronary artery disease    DDD (degenerative disc disease), cervical    Diabetes mellitus ([place])    Dyslipidemia    Hypertension    Port-A-Cath in place 07/20/2021   Tobacco dependence     Past Surgical History:  Procedure Laterality Date   BILIARY BRUSHING N/A 02/25/2021   Procedure: BILIARY BRUSHING;  Surgeon: Rogene Houston, MD;  Location: [facility] OR[facility];  Service: Gastroenterology;  Laterality: N/A;   BILIARY STENT PLACEMENT N/A 02/25/2021   Procedure: BILIARY STENT PLACEMENT;  Surgeon: Rogene Houston, MD;  Location: [facility];  Service: Gastroenterology;  Laterality: N/A;   BILIARY STENT PLACEMENT  02/27/2021   Procedure: BILIARY STENT PLACEMENT (10FR x 9cm) IN THE RIGHT SYSTEM;  Surgeon: Rogene Houston, MD;  Location: [facility];  Service: Gastroenterology;;   BREAST SURGERY Left    benign lump- in his 45s.   CARDIAC CATHETERIZATION     ERCP N/A 02/25/2021   Procedure: ENDOSCOPIC RETROGRADE  CHOLANGIOPANCREATOGRAPHY (ERCP);  Surgeon: Rogene Houston, MD;  Location: [facility];  Service: Gastroenterology;  Laterality: N/A;   ERCP N/A 02/27/2021   Procedure: ENDOSCOPIC RETROGRADE CHOLANGIOPANCREATOGRAPHY (ERCP);  Surgeon: Rogene Houston, MD;  Location: [facility];  Service: Gastroenterology;  Laterality: N/A;   IR IMAGING GUIDED PORT INSERTION  07/21/2021   KNEE SURGERY Left    x2   LEFT HEART CATH AND CORONARY ANGIOGRAPHY N/A 09/08/2021   Procedure: LEFT HEART CATH AND CORONARY ANGIOGRAPHY;  Surgeon: Early Osmond, MD;  Location: [place] CV LAB;  Service: Cardiovascular;  Laterality: N/A;   SPHINCTEROTOMY N/A 02/25/2021   Procedure: SPHINCTEROTOMY;  Surgeon: Rogene Houston, MD;  Location: [facility];  Service: Gastroenterology;  Laterality: N/A;   STENT REMOVAL  02/27/2021   Procedure: STENT REMOVAL (8.5Fr x 9cm);  Surgeon: Rogene Houston, MD;  Location: [facility];  Service: Gastroenterology;;    Allergies: Patient has no known allergies.  Medications: Prior to Admission medications   Medication Sig Start Date End Date Taking? Authorizing Provider  acetaminophen (TYLENOL) 500 MG tablet Take 500 mg by mouth every 6 (six) hours as needed for moderate pain or headache.   Yes [provider]  albuterol (VENTOLIN HFA) 108 (90 Base) MCG/ACT inhaler Inhale 2 puffs into the lungs every 6 (six) hours as needed for wheezing or shortness of breath. 11/29/21  Yes Tat, Shanon Brow, MD  alfuzosin (UROXATRAL) 10 MG 24 hr tablet Take 1 tablet (10 mg total) by mouth at bedtime. 08/21/22  Yes McKenzie, Candee Furbish,  MD  atorvastatin (LIPITOR) 40 MG tablet Take 1 tablet (40 mg total) by mouth daily at 6 PM. 12/05/21  Yes O'Neal, Cassie Freer, MD  carvedilol (COREG) 6.25 MG tablet Take 1 tablet (6.25 mg total) by mouth 2 (two) times daily. 06/12/22  Yes O'Neal, Cassie Freer, MD  cephALEXin (KEFLEX) 500 MG capsule Take 500 mg by mouth every 6 (six) hours. 06/25/22  Yes [provider]   famotidine (PEPCID) 20 MG tablet Take 20 mg by mouth 2 (two) times daily.   Yes [provider]  ferrous sulfate (FERROUSUL) 325 (65 FE) MG tablet Take 1 tablet (325 mg total) by mouth daily with breakfast. 03/18/21  Yes Rehman, Mechele Dawley, MD  lisinopril (ZESTRIL) 2.5 MG tablet Take 1 tablet (2.5 mg total) by mouth daily. 09/11/22 09/06/23 Yes Mallipeddi, Vishnu P, MD  loratadine (CLARITIN) 10 MG tablet Take 10 mg by mouth daily. Takes for a few days after shot to boost WBC. About every 2nd treatment   Yes [provider]  pantoprazole (PROTONIX) 40 MG tablet Take 1 tablet (40 mg total) by mouth daily. 12/05/21  Yes O'Neal, Cassie Freer, MD  prochlorperazine (COMPAZINE) 10 MG tablet TAKE (1) TABLET BY MOUTH EVERY (6) HOURS AS NEEDED. 09/28/22  Yes Derek Jack, MD  tamsulosin (FLOMAX) 0.4 MG CAPS capsule TAKE (2) CAPSULES BY MOUTH AT BEDTIME. 05/08/22  Yes Derek Jack, MD  CISPLATIN IV Inject into the vein once a week. Days 1, 8 every 21 days 07/22/21   [provider]  Durvalumab (IMFINZI IV) Inject into the vein every 21 ( twenty-one) days. 07/22/21   [provider]  Gemcitabine HCl (GEMZAR IV) Inject into the vein once a week. Days 1, 8 every 21 days 07/22/21   [provider]  Ginseng 100 MG CAPS Take 1 capsule by mouth daily.    [provider]  lidocaine-prilocaine (EMLA) cream  05/27/22   [provider]  metFORMIN (GLUCOPHAGE) 500 MG tablet Take 1 tablet (500 mg total) by mouth 2 (two) times daily with a meal. 05/29/21 01/13/22  Dessa Phi, DO  nitroGLYCERIN (NITROSTAT) 0.4 MG SL tablet Place 1 tablet (0.4 mg total) under the tongue every 5 (five) minutes as needed for chest pain. 09/12/21 09/12/22  Ledora Bottcher, PA  Vitamin D, Ergocalciferol, (DRISDOL) 1.25 MG (50000 UNIT) CAPS capsule Take 1 capsule by mouth once a week. 06/29/22   [provider]     Family History  Problem Relation Age of Onset    CAD Mother    Cancer Neg Hx     Social History   Socioeconomic History   Marital status: Married    Spouse name: Not on file   Number of children: Not on file   Years of education: Not on file   Highest education level: Not on file  Occupational History   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  Tobacco Use   Smoking status: Every Day    Packs/day: 0.50    Years: 56.00    Total pack years: 28.00    Types: Cigarettes   Smokeless tobacco: Never  Vaping Use   Vaping Use: Never used  Substance and Sexual Activity   Alcohol use: Not Currently    Comment: stopped 2.5 years ago 03/11/21   Drug use: Never   Sexual activity: Not on file  Other Topics Concern   Not on file  Social History Narrative   Not on file   Social Determinants of Health   Financial Resource  Strain: Medium Risk (03/07/2021)   Overall Financial Resource Strain (CARDIA)    Difficulty of Paying Living Expenses: Somewhat hard  Food Insecurity: No Food Insecurity (03/07/2021)   Hunger Vital Sign    Worried About Running Out of Food in the Last Year: Never true    Ran Out of Food in the Last Year: Never true  Transportation Needs: No Transportation Needs (03/07/2021)   PRAPARE - Hydrologist (Medical): No    Lack of Transportation (Non-Medical): No  Physical Activity: Insufficiently Active (03/07/2021)   Exercise Vital Sign    Days of Exercise per Week: 2 days    Minutes of Exercise per Session: 20 min  Stress: No Stress Concern Present (03/07/2021)   [place]    Feeling of Stress : Only a little  Social Connections: Moderately Integrated (03/07/2021)   Social Connection and Isolation Panel [NHANES]    Frequency of Communication with Friends and Family: More than three times a week    Frequency of Social Gatherings with Friends and Family: More than three times a week    Attends Religious Services: More than 4 times per  year    Active Member of [employer] or Organizations: No    Attends Archivist Meetings: Never    Marital Status: Married    Review of Systems: A 12 point ROS discussed and pertinent positives are indicated in the HPI above.  All other systems are negative.  Review of Systems  Constitutional:  Positive for fatigue. Negative for appetite change.  Respiratory:  Negative for cough and shortness of breath.   Cardiovascular:  Negative for chest pain and leg swelling.  Gastrointestinal:  Negative for abdominal pain, diarrhea, nausea and vomiting.  Skin:  Positive for wound.       Port-a-catheter eroding through skin   Neurological:  Negative for dizziness and headaches.    Vital Signs: BP 106/69   Pulse 82   Temp 98.7 F (37.1 C) (Oral)   Resp 18   Ht 5' 11"$  (1.803 m)   Wt 157 lb (71.2 kg)   SpO2 96%   BMI 21.90 kg/m   Physical Exam Constitutional:      General: He is not in acute distress. HENT:     Mouth/Throat:     Mouth: Mucous membranes are moist.     Pharynx: Oropharynx is clear.  Cardiovascular:     Comments: Right IJ port-a-catheter site is red and tender. Port is eroding through the skin. Port is visible.  Pulmonary:     Effort: Pulmonary effort is normal.  Skin:    General: Skin is warm and dry.  Neurological:     Mental Status: He is alert and oriented to person, place, and time.     Imaging: ECHOCARDIOGRAM LIMITED  Result Date: 09/14/2022    ECHOCARDIOGRAM LIMITED REPORT   Patient Name:   Jonathan Keith Date of Exam: 09/14/2022 Medical Rec #:  [id number]       Height:       71.0 in Accession #:    [id number]      Weight:       158.0 lb Date of Birth:  04/11/1956       BSA:          1.908 m Patient Age:    6 years        BP:           118/70 mmHg  Patient Gender: M               HR:           76 bpm. Exam Location:  [facility] Procedure: Limited Echo and Intracardiac Opacification Agent Indications:    Thrombus in heart chamber [id number]  History:         Patient has prior history of Echocardiogram examinations, most                 recent 09/09/2022. Cardiomyopathy, Thrombus and CHF, CAD and                 NSTEMI; Risk Factors:Hypertension, Current Smoker, Dyslipidemia                 and Diabetes.  Sonographer:    Greer Pickerel Referring Phys: [id number] VISHNU P MALLIPEDDI  Sonographer Comments: Image acquisition challenging due to respiratory motion. IMPRESSIONS  1. 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 The very distal apex is akinetic. There is dense slow moving circulating blood flow in the apex without clear apical thrombus. Left ventricular ejection fraction, by estimation, is 45 to 50%.  The left ventricle has mildly decreased function. The left ventricle demonstrates regional wall motion abnormalities (see scoring diagram/findings for description).  2. 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 The very distal apex is akinetic. There is dense slow moving circulating blood flow in the apex without clear apical thrombus. Left ventricular ejection fraction, by estimation, is 45 to 50%. The left ventricle has mildly decreased function. The left ventricle demonstrates regional wall motion abnormalities. Definity contrast agent was given IV to delineate the left ventricular endocardial borders. LEFT VENTRICLE PLAX 2D LVIDd:         5.20 cm LVIDs:         3.90 cm LV PW:         1.00 cm LV IVS:        1.00 cm  Carlyle Dolly MD Electronically signed by Carlyle Dolly MD Signature Date/Time: 09/14/2022/10:16:14 AM    Final    ECHOCARDIOGRAM COMPLETE  Result Date: 09/09/2022    ECHOCARDIOGRAM REPORT   Patient Name:   Jonathan Keith Date of Exam: 09/09/2022 Medical Rec #:  [id number]       Height:       69.0 in Accession #:    [id number]      Weight:       157.4 lb Date of Birth:  07/15/1956       BSA:          1.866 m Patient Age:    59 years        BP:           114/73  mmHg Patient Gender: M               HR:           74 bpm. Exam Location:  [facility] Procedure: 2D Echo, Cardiac Doppler and Color Doppler Indications:    I50.22 (ICD-10-CM) - Chronic systolic heart failure  History:        Patient has prior history of Echocardiogram examinations, most                 recent 06/12/2022. CHF, Previous Myocardial Infarction; Risk                 Factors:Hypertension, Diabetes, Dyslipidemia  and Current Smoker.                 Hx of Left ventricular thrombus, Port-A-Cath in place,                 Cholangiocarcinoma.  Sonographer:    Alvino Chapel RCS Referring Phys: [id number] [place]  1. Left ventricular ejection fraction, by estimation, is 45 to 50%. The left ventricle has mildly decreased function. Left ventricular endocardial border not optimally defined to evaluate regional wall motion. Left ventricular diastolic parameters are consistent with Grade I diastolic dysfunction (impaired relaxation).  2. Right ventricular systolic function was not well visualized. The right ventricular size is normal. Tricuspid regurgitation signal is inadequate for assessing PA pressure.  3. The mitral valve is normal in structure. Trivial mitral valve regurgitation. No evidence of mitral stenosis.  4. The aortic valve is tricuspid. There is mild calcification of the aortic valve. Aortic valve regurgitation is not visualized. No aortic stenosis is present.  5. The inferior vena cava is normal in size with greater than 50% respiratory variability, suggesting right atrial pressure of 3 mmHg. Comparison(s): No significant change from prior study. FINDINGS  Left Ventricle: Left ventricular ejection fraction, by estimation, is 45 to 50%. The left ventricle has mildly decreased function. Left ventricular endocardial border not optimally defined to evaluate regional wall motion. The left ventricular internal cavity size was normal in size. There is no left ventricular hypertrophy. Left  ventricular diastolic parameters are consistent with Grade I diastolic dysfunction (impaired relaxation). Right Ventricle: The right ventricular size is normal. No increase in right ventricular wall thickness. Right ventricular systolic function was not well visualized. Tricuspid regurgitation signal is inadequate for assessing PA pressure. Left Atrium: Left atrial size was normal in size. Right Atrium: Right atrial size was normal in size. Pericardium: Trivial pericardial effusion is present. Mitral Valve: The mitral valve is normal in structure. Trivial mitral valve regurgitation. No evidence of mitral valve stenosis. Tricuspid Valve: The tricuspid valve is not well visualized. Tricuspid valve regurgitation is not demonstrated. No evidence of tricuspid stenosis. Aortic Valve: The aortic valve is tricuspid. There is mild calcification of the aortic valve. Aortic valve regurgitation is not visualized. No aortic stenosis is present. Pulmonic Valve: The pulmonic valve was not well visualized. Pulmonic valve regurgitation is not visualized. No evidence of pulmonic stenosis. Aorta: The aortic root is normal in size and structure. Venous: The inferior vena cava is normal in size with greater than 50% respiratory variability, suggesting right atrial pressure of 3 mmHg. IAS/Shunts: No atrial level shunt detected by color flow Doppler.  LEFT VENTRICLE PLAX 2D LVIDd:         5.25 cm   Diastology LVIDs:         4.05 cm   LV e' medial:    4.46 cm/s LV PW:         1.05 cm   LV E/e' medial:  9.0 LV IVS:        0.95 cm   LV e' lateral:   8.16 cm/s LVOT diam:     2.20 cm   LV E/e' lateral: 4.9 LV SV:         63 LV SV Index:   34 LVOT Area:     3.80 cm  RIGHT VENTRICLE RV S prime:     6.96 cm/s TAPSE (M-mode): 1.5 cm LEFT ATRIUM             Index  RIGHT ATRIUM           Index LA diam:        3.40 cm 1.82 cm/m   RA Area:     16.50 cm LA Vol (A2C):   49.4 ml 26.47 ml/m  RA Volume:   42.10 ml  22.56 ml/m LA Vol (A4C):    44.5 ml 23.85 ml/m LA Biplane Vol: 50.3 ml 26.96 ml/m  AORTIC VALVE LVOT Vmax:   72.30 cm/s LVOT Vmean:  47.300 cm/s LVOT VTI:    0.166 m  AORTA Ao Root diam: 3.60 cm MITRAL VALVE MV Area (PHT): 2.22 cm    SHUNTS MV Decel Time: 342 msec    Systemic VTI:  0.17 m MV E velocity: 40.10 cm/s  Systemic Diam: 2.20 cm MV A velocity: 64.60 cm/s MV E/A ratio:  0.62 Vishnu Priya Mallipeddi Electronically signed by Lorelee Cover Mallipeddi Signature Date/Time: 09/09/2022/5:35:23 PM    Final     Labs:  CBC: Recent Labs    07/08/22 0750 08/05/22 0810 09/01/22 0915 09/28/22 1004  WBC 5.9 4.8 5.0 5.9  HGB 11.4* 11.2* 12.0* 11.4*  HCT 33.9* 32.9* 35.7* 33.2*  PLT 69* 76* 83* 85*    COAGS: Recent Labs    07/21/22 1026 08/06/22 1401 08/19/22 1341 09/02/22 1359  INR 3.7* 2.2 3.2* 3.0    BMP: Recent Labs    07/08/22 0750 08/05/22 0810 09/01/22 0915 09/28/22 1004  NA 132* 133* 134* 128*  K 4.3 4.3 4.2 4.4  CL 101 103 103 98  CO2 22 23 23 $ 21*  GLUCOSE 189* 144* 135* 149*  BUN 16 12 18 18  $ CALCIUM 8.7* 8.3* 8.6* 8.5*  CREATININE 1.27* 1.25* 1.38* 1.39*  GFRNONAA >60 >60 56* 56*    LIVER FUNCTION TESTS: Recent Labs    07/08/22 0750 08/05/22 0810 09/01/22 0915 09/28/22 1004  BILITOT 1.2 1.3* 1.4* 3.5*  AST 53* 65* 58* 91*  ALT 40 47* 42 69*  ALKPHOS 243* 252* 263* 268*  PROT 6.8 6.5 6.9 6.6  ALBUMIN 2.8* 2.7* 2.9* 2.7*    TUMOR MARKERS: No results for input(s): "AFPTM", "CEA", "CA199", "CHROMGRNA" in the last 8760 hours.  Assessment and Plan:  Metastatic cholangiocarcinoma with malfunctioning port-a-catheter: Jonathan Keith, 67 year old male, presents today to the [facility] department for port removal and placement of a PICC for continued IV therapies.   Risks and benefits of port-a-catheter removal with PICC placement were discussed with the patient including, but not limited to bleeding, infection and need for additional procedures.  All of  the patient's questions were answered, patient is agreeable to proceed. He has been NPO. He is a full code.   Consent signed and in chart.  Thank you for this interesting consult.  I greatly enjoyed meeting Jonathan Keith and look forward to participating in their care.  A copy of this report was sent to the requesting provider on this date.  Electronically Signed: Soyla Dryer, AGACNP-BC [phone] 09/30/2022, 1:32 PM   I spent a total of  30 Minutes   in face to face in clinical consultation, greater than 50% of which was counseling/coordinating care for port removal with placement of PICC.

## 2022-09-30 NOTE — Procedures (Signed)
Interventional Radiology Procedure Note  Procedure: RT IJ PORT REMOVAL RUE SL PICC     Complications: None  Estimated Blood Loss:  MIN  Findings: TIP SVCRA    Tamera Punt, MD

## 2022-10-01 ENCOUNTER — Other Ambulatory Visit: Payer: Self-pay

## 2022-10-01 ENCOUNTER — Ambulatory Visit (HOSPITAL_COMMUNITY): Payer: Medicare Other | Attending: Hematology | Admitting: Physical Therapy

## 2022-10-01 DIAGNOSIS — S21101A Unspecified open wound of right front wall of thorax without penetration into thoracic cavity, initial encounter: Secondary | ICD-10-CM | POA: Diagnosis present

## 2022-10-01 NOTE — Therapy (Signed)
OUTPATIENT PHYSICAL THERAPY NEURO EVALUATION   Patient Name: Jonathan Keith MRN: [id number] DOB:1955-09-19, 67 y.o., male Today's Date: 10/01/2022   PCP: [employer] REFERRING PROVIDER: Ferd Hibbs  END OF SESSION:  PT End of Session - 10/01/22 1523     Visit Number 1    Number of Visits 3    Date for PT Re-Evaluation 10/08/22    Authorization Type medicare A    PT Start Time 1435    PT Stop Time 1505    PT Time Calculation (min) 30 min    Activity Tolerance Patient tolerated treatment well    Behavior During Therapy WFL for tasks assessed/performed             Past Medical History:  Diagnosis Date   Cancer ([place])    CHF (congestive heart failure) (HCC)    Chronic pain    neck, back, knees   Class 1 obesity due to excess calories with body mass index (BMI) of 31.0 to 31.9 in adult    Claudication [facility])    Coronary artery disease    DDD (degenerative disc disease), cervical    Diabetes mellitus ([place])    Dyslipidemia    Hypertension    Port-A-Cath in place 07/20/2021   Tobacco dependence    Past Surgical History:  Procedure Laterality Date   BILIARY BRUSHING N/A 02/25/2021   Procedure: BILIARY BRUSHING;  Surgeon: Rogene Houston, MD;  Location: [facility];  Service: Gastroenterology;  Laterality: N/A;   BILIARY STENT PLACEMENT N/A 02/25/2021   Procedure: BILIARY STENT PLACEMENT;  Surgeon: Rogene Houston, MD;  Location: [facility];  Service: Gastroenterology;  Laterality: N/A;   BILIARY STENT PLACEMENT  02/27/2021   Procedure: BILIARY STENT PLACEMENT (10FR x 9cm) IN THE RIGHT SYSTEM;  Surgeon: Rogene Houston, MD;  Location: [facility];  Service: Gastroenterology;;   BREAST SURGERY Left    benign lump- in his 37s.   CARDIAC CATHETERIZATION     ERCP N/A 02/25/2021   Procedure: ENDOSCOPIC RETROGRADE CHOLANGIOPANCREATOGRAPHY (ERCP);  Surgeon: Rogene Houston, MD;  Location: [facility];  Service: Gastroenterology;  Laterality: N/A;   ERCP N/A 02/27/2021   Procedure:  ENDOSCOPIC RETROGRADE CHOLANGIOPANCREATOGRAPHY (ERCP);  Surgeon: Rogene Houston, MD;  Location: [facility];  Service: Gastroenterology;  Laterality: N/A;   IR IMAGING GUIDED PORT INSERTION  07/21/2021   IR REMOVAL TUN ACCESS W/ PORT W/O FL MOD SED  09/30/2022   KNEE SURGERY Left    x2   LEFT HEART CATH AND CORONARY ANGIOGRAPHY N/A 09/08/2021   Procedure: LEFT HEART CATH AND CORONARY ANGIOGRAPHY;  Surgeon: Early Osmond, MD;  Location: [place] CV LAB;  Service: Cardiovascular;  Laterality: N/A;   SPHINCTEROTOMY N/A 02/25/2021   Procedure: SPHINCTEROTOMY;  Surgeon: Rogene Houston, MD;  Location: [facility];  Service: Gastroenterology;  Laterality: N/A;   STENT REMOVAL  02/27/2021   Procedure: STENT REMOVAL (8.5Fr x 9cm);  Surgeon: Rogene Houston, MD;  Location: [facility];  Service: Gastroenterology;;   Patient Active Problem List   Diagnosis Date Noted   Ischemic cardiomyopathy 09/11/2022   AKI (acute kidney injury) ([place]) 09/11/2022   Thrombus in heart chamber 06/09/2022   Encounter for therapeutic drug monitoring 06/09/2022   Lobar pneumonia ([place]) 11/28/2021   Acute on chronic systolic CHF (congestive heart failure) ([place]) 11/27/2021   Coronary artery disease 11/27/2021   Tobacco abuse 11/27/2021   Acute respiratory failure with hypoxia ([place]) 11/27/2021   Elevated troponin 11/27/2021   NSTEMI (non-ST  elevated myocardial infarction) ([place]) 09/08/2021   ACS (acute coronary syndrome) ([place]) 09/08/2021   Nausea without vomiting 09/01/2021   Dehydration 07/23/2021   Port-A-Cath in place 07/20/2021   Atherosclerosis of native arteries of extremities with intermittent claudication, bilateral legs ([place]) 06/30/2021   Abnormal levels of other serum enzymes 06/30/2021   Nicotine dependence, cigarettes, uncomplicated 0000000   Biliary tract cancer ([place]) 06/12/2021   Hyperbilirubinemia    Sepsis ([place]) 05/25/2021   SIRS (systemic inflammatory response syndrome) ([place]) 04/28/2021    Cholangiocarcinoma ([place]) 04/10/2021   Calculus of bile duct without cholangitis or cholecystitis without obstruction 04/10/2021   Acute febrile illness 03/12/2021   Acute cholecystitis 03/12/2021   Diverticulitis 03/12/2021   Transaminitis 03/12/2021   Leukocytosis 03/12/2021   Hyponatremia 03/12/2021   Hyperglycemia due to diabetes mellitus ([place]) 03/12/2021   GERD (gastroesophageal reflux disease) 03/12/2021   Hypoalbuminemia due to protein-calorie malnutrition ([place]) 03/12/2021   Type 2 diabetes mellitus without complications ([place]) [id number]   Gastroesophageal reflux disease 03/12/2021   Elevated LFTs    Choledocholithiasis    Obstructive jaundice 02/24/2021   Class 1 obesity due to excess calories with body mass index (BMI) of 31.0 to 31.9 in adult 02/24/2021   Controlled diabetes mellitus type 2 with complications ([place]) 99991111   HLD (hyperlipidemia) 02/24/2021   Hypertension 02/24/2021   Tobacco dependence 02/24/2021   Elevated blood-pressure reading, without diagnosis of hypertension 02/24/2021    ONSET DATE: 09/30/2022  REFERRING DIAG: Per Dr. Greggory Keen, "since the port had eroded thru the skin, have to leave it open and packed to heal by secondary intention. will need wet to dry dressing changes starting tomorrow, daily for 7-10days."  THERAPY DIAG:  Rt chest wound   Rationale for Evaluation and Treatment: Rehabilitation     Wound Therapy - 10/01/22 0001     Subjective Pt states no pain at this time  .    Patient and Family Stated Goals wound to heal    Date of Onset 10/01/22    Prior Treatments irrigated after port dislodged at MD office yesterday.    Pain Score 0-No pain    Evaluation and Treatment Procedures Explained to Patient/Family Yes    Evaluation and Treatment Procedures agreed to    Wound Properties Date First Assessed: 10/01/22 Time First Assessed: 1440 Wound Type: Incision - Open Location: Chest Location Orientation: Right;Anterior;Upper Present  on Admission: Yes   Wound Image Images linked: 1    Dressing Type Gauze (Comment)    Dressing Changed Changed    Dressing Status Old drainage    Dressing Change Frequency Daily    Site / Wound Assessment Clean;Pale    % Wound base Red or Granulating 100%    % Wound base Yellow/Fibrinous Exudate 0%    Peri-wound Assessment Edema    Wound Length (cm) 1.5 cm    Wound Width (cm) 2.5 cm    Wound Depth (cm) 0.5 cm    Wound Volume (cm^3) 1.88 cm^3    Wound Surface Area (cm^2) 3.75 cm^2    Drainage Amount Minimal    Drainage Description Serosanguineous    Treatment Cleansed;Irrigation;Other (Comment)   self care   Wound Therapy - Clinical Statement see below    Wound Therapy - Functional Problem List bathing/ dressing    Factors Delaying/Impairing Wound Healing Diabetes Mellitus;Infection - systemic/local;Polypharmacy;Multiple medical problems    Hydrotherapy Plan Dressing change;Patient/family education;Other (comment)   may consider wound vac   Wound Therapy - Frequency Other (comment)  Wound Therapy - Current Recommendations --   MD requests daily, PT will educate wife on dressing changes   Wound Therapy - Follow Up Recommendations Home health RN    Dressing  hydrogel impregnated 2x2 followed by dry 2x2 and tegaderm.               PATIENT EDUCATION: Education details: keep dressing dry/how to pak wound  Person educated: Patient and Spouse Education method: Explanation Education comprehension: needs further education     GOALS: Goals reviewed with patient? No  SHORT TERM GOALS: Target date: 10/05/2022  PT wife to be I in dressing changes to keep wound from becoming infected.  Baseline: Goal status: INITIAL  ASSESSMENT:  CLINICAL IMPRESSION: Patient is a 67 y.o. male who was seen today for physical therapy evaluation and treatment for wound care following the removal of an IJ port on the Rt.  The pt has metastatic cholongiocarcinoma.  His port had to be removed due  to it eroding thru the skin.  Due to decreased skin integrity the skin was unable to be closed and will have to heal from the inside out.  In order to prevent infection and promote healing the pt physician desires daily dressing change.  The therapist believes that a wound vac will be beneficial for this pt therefore the MD's office was contacted.  They will be attempting to get nursing into the pt home, however, in the meantime the patients wife will need to be educated on how to dress the wound.  The pt will benefit coming back to skilled PT for one or two visits to assure confidence in the caring of the wound.     OBJECTIVE IMPAIRMENTS:  skin integrity .   ACTIVITY LIMITATIONS: bathing and dressing   PERSONAL FACTORS: 1-2 comorbidities: DM, CA  are also affecting patient's functional outcome.   REHAB POTENTIAL: Good  CLINICAL DECISION MAKING: Stable/uncomplicated  EVALUATION COMPLEXITY: Low  PLAN: PT FREQUENCY: 2x/week  PT DURATION: 2 weeks  PLANNED INTERVENTIONS: Patient/Family education and Self Care  PLAN FOR NEXT SESSION: Teach pt wife how to dress wound. Rayetta Humphrey, [place] CLT [phone]  10/01/2022 1530

## 2022-10-02 ENCOUNTER — Ambulatory Visit (HOSPITAL_COMMUNITY): Payer: Medicare Other | Admitting: Physical Therapy

## 2022-10-02 DIAGNOSIS — S21101A Unspecified open wound of right front wall of thorax without penetration into thoracic cavity, initial encounter: Secondary | ICD-10-CM | POA: Diagnosis not present

## 2022-10-02 NOTE — Therapy (Signed)
OUTPATIENT PHYSICAL THERAPY wound treatment   Patient Name: Jonathan Keith MRN: [id number] DOB:1955-11-11, 67 y.o., male Today's Date: 10/02/2022   PCP: [employer] REFERRING PROVIDER: Ferd Hibbs  END OF SESSION:  PT End of Session - 10/02/22 1459     Visit Number 2    Number of Visits 3    Date for PT Re-Evaluation 10/08/22    Authorization Type medicare A    PT Start Time 1425    PT Stop Time 1452    PT Time Calculation (min) 27 min    Activity Tolerance Patient tolerated treatment well    Behavior During Therapy WFL for tasks assessed/performed             Past Medical History:  Diagnosis Date   Cancer ([place])    CHF (congestive heart failure) (HCC)    Chronic pain    neck, back, knees   Class 1 obesity due to excess calories with body mass index (BMI) of 31.0 to 31.9 in adult    Claudication [facility])    Coronary artery disease    DDD (degenerative disc disease), cervical    Diabetes mellitus ([place])    Dyslipidemia    Hypertension    Port-A-Cath in place 07/20/2021   Tobacco dependence    Past Surgical History:  Procedure Laterality Date   BILIARY BRUSHING N/A 02/25/2021   Procedure: BILIARY BRUSHING;  Surgeon: Rogene Houston, MD;  Location: [facility];  Service: Gastroenterology;  Laterality: N/A;   BILIARY STENT PLACEMENT N/A 02/25/2021   Procedure: BILIARY STENT PLACEMENT;  Surgeon: Rogene Houston, MD;  Location: [facility];  Service: Gastroenterology;  Laterality: N/A;   BILIARY STENT PLACEMENT  02/27/2021   Procedure: BILIARY STENT PLACEMENT (10FR x 9cm) IN THE RIGHT SYSTEM;  Surgeon: Rogene Houston, MD;  Location: [facility];  Service: Gastroenterology;;   BREAST SURGERY Left    benign lump- in his 41s.   CARDIAC CATHETERIZATION     ERCP N/A 02/25/2021   Procedure: ENDOSCOPIC RETROGRADE CHOLANGIOPANCREATOGRAPHY (ERCP);  Surgeon: Rogene Houston, MD;  Location: [facility];  Service: Gastroenterology;  Laterality: N/A;   ERCP N/A 02/27/2021   Procedure:  ENDOSCOPIC RETROGRADE CHOLANGIOPANCREATOGRAPHY (ERCP);  Surgeon: Rogene Houston, MD;  Location: [facility];  Service: Gastroenterology;  Laterality: N/A;   IR IMAGING GUIDED PORT INSERTION  07/21/2021   IR REMOVAL TUN ACCESS W/ PORT W/O FL MOD SED  09/30/2022   KNEE SURGERY Left    x2   LEFT HEART CATH AND CORONARY ANGIOGRAPHY N/A 09/08/2021   Procedure: LEFT HEART CATH AND CORONARY ANGIOGRAPHY;  Surgeon: Early Osmond, MD;  Location: [place] CV LAB;  Service: Cardiovascular;  Laterality: N/A;   SPHINCTEROTOMY N/A 02/25/2021   Procedure: SPHINCTEROTOMY;  Surgeon: Rogene Houston, MD;  Location: [facility];  Service: Gastroenterology;  Laterality: N/A;   STENT REMOVAL  02/27/2021   Procedure: STENT REMOVAL (8.5Fr x 9cm);  Surgeon: Rogene Houston, MD;  Location: [facility];  Service: Gastroenterology;;   Patient Active Problem List   Diagnosis Date Noted   Ischemic cardiomyopathy 09/11/2022   AKI (acute kidney injury) ([place]) 09/11/2022   Thrombus in heart chamber 06/09/2022   Encounter for therapeutic drug monitoring 06/09/2022   Lobar pneumonia ([place]) 11/28/2021   Acute on chronic systolic CHF (congestive heart failure) ([place]) 11/27/2021   Coronary artery disease 11/27/2021   Tobacco abuse 11/27/2021   Acute respiratory failure with hypoxia ([place]) 11/27/2021   Elevated troponin 11/27/2021   NSTEMI (non-ST  elevated myocardial infarction) ([place]) 09/08/2021   ACS (acute coronary syndrome) ([place]) 09/08/2021   Nausea without vomiting 09/01/2021   Dehydration 07/23/2021   Port-A-Cath in place 07/20/2021   Atherosclerosis of native arteries of extremities with intermittent claudication, bilateral legs ([place]) 06/30/2021   Abnormal levels of other serum enzymes 06/30/2021   Nicotine dependence, cigarettes, uncomplicated 0000000   Biliary tract cancer ([place]) 06/12/2021   Hyperbilirubinemia    Sepsis ([place]) 05/25/2021   SIRS (systemic inflammatory response syndrome) ([place]) 04/28/2021    Cholangiocarcinoma ([place]) 04/10/2021   Calculus of bile duct without cholangitis or cholecystitis without obstruction 04/10/2021   Acute febrile illness 03/12/2021   Acute cholecystitis 03/12/2021   Diverticulitis 03/12/2021   Transaminitis 03/12/2021   Leukocytosis 03/12/2021   Hyponatremia 03/12/2021   Hyperglycemia due to diabetes mellitus ([place]) 03/12/2021   GERD (gastroesophageal reflux disease) 03/12/2021   Hypoalbuminemia due to protein-calorie malnutrition ([place]) 03/12/2021   Type 2 diabetes mellitus without complications ([place]) [id number]   Gastroesophageal reflux disease 03/12/2021   Elevated LFTs    Choledocholithiasis    Obstructive jaundice 02/24/2021   Class 1 obesity due to excess calories with body mass index (BMI) of 31.0 to 31.9 in adult 02/24/2021   Controlled diabetes mellitus type 2 with complications ([place]) 99991111   HLD (hyperlipidemia) 02/24/2021   Hypertension 02/24/2021   Tobacco dependence 02/24/2021   Elevated blood-pressure reading, without diagnosis of hypertension 02/24/2021    ONSET DATE: 09/30/2022  REFERRING DIAG: Per Dr. Greggory Keen, "since the port had eroded thru the skin, have to leave it open and packed to heal by secondary intention. will need wet to dry dressing changes starting tomorrow, daily for 7-10days."  THERAPY DIAG:  Rt chest wound   Rationale for Evaluation and Treatment: Rehabilitation    10/02/22 0001  Subjective Assessment  Subjective Pt states no pain .  Patient and Family Stated Goals wound to heal  Date of Onset 10/01/22  Prior Treatments irrigated after port dislodged at MD office yesterday.  Pain Assessment  Pain Score 0  Evaluation and Treatment  Evaluation and Treatment Procedures Explained to Patient/Family Yes  Evaluation and Treatment Procedures agreed to  Wound / Incision (Open or Dehisced) 10/01/22 Incision - Open Chest Right;Anterior;Upper  Date First Assessed/Time First Assessed: 10/01/22 1440   Wound  Type: Incision - Open  Location: Chest  Location Orientation: Right;Anterior;Upper  Present on Admission: Yes  Dressing Type Gauze (Comment)  Dressing Changed Changed  Dressing Status Old drainage  Dressing Change Frequency Daily  Site / Wound Assessment Clean;Pale  % Wound base Red or Granulating 100%  % Wound base Yellow/Fibrinous Exudate 0%  Drainage Amount Minimal  Drainage Description Serous  Treatment Cleansed;Irrigation (PT's wife completing as therapist instructs and observes.)  Wound Therapy - Assess/Plan/Recommendations  Wound Therapy - Clinical Statement see below  Wound Therapy - Functional Problem List bathing/ dressing  Factors Delaying/Impairing Wound Healing Diabetes Mellitus;Infection - systemic/local;Polypharmacy;Multiple medical problems  Hydrotherapy Plan Dressing change;Patient/family education;Other (comment) (may consider wound vac)  Wound Therapy - Frequency Other (comment)  Wound Therapy - Current Recommendations  (MD requests daily, PT will educate wife on dressing changes)  Wound Therapy - Follow Up Recommendations Home health RN  Wound Therapy  Dressing  hydrogel impregnated 2x2 followed by dry 2x2 and tegaderm.    PATIENT EDUCATION: Education details: keep dressing dry/how to pak wound  Person educated: Patient and Spouse Education method: Explanation Education comprehension: needs further education     GOALS: Goals reviewed with patient? No  SHORT TERM GOALS: Target date: 10/05/2022  PT wife to be I in dressing changes to keep wound from becoming infected.  Baseline: Goal status: IN PROGRESS  ASSESSMENT:  CLINICAL IMPRESSION: Wife accompanies Pt today.  Home health was to be contacted by MD, however pt has not heard from them as yet.  Therapist took pt's wife thru irrigation and dressing change step by step having pt wife complete the activities and addressing any concerns.  Pt family will complete dressing change over the weekend.  We will  see pt one more time to ensure family has no questions or concerns.    OBJECTIVE IMPAIRMENTS:  skin integrity .   ACTIVITY LIMITATIONS: bathing and dressing   PERSONAL FACTORS: 1-2 comorbidities: DM, CA  are also affecting patient's functional outcome.   REHAB POTENTIAL: Good  CLINICAL DECISION MAKING: Stable/uncomplicated  EVALUATION COMPLEXITY: Low  PLAN: PT FREQUENCY: 2x/week  PT DURATION: 2 weeks  PLANNED INTERVENTIONS: Patient/Family education and Self Care  PLAN FOR NEXT SESSION: Observe wife completing dressing change, answer any questions and Discharge Rayetta Humphrey, [place] CLT [phone]  10/01/2022 1505

## 2022-10-05 ENCOUNTER — Inpatient Hospital Stay: Payer: Medicare Other

## 2022-10-05 ENCOUNTER — Inpatient Hospital Stay (HOSPITAL_BASED_OUTPATIENT_CLINIC_OR_DEPARTMENT_OTHER): Payer: Medicare Other | Admitting: Hematology

## 2022-10-05 VITALS — BP 122/81 | HR 94 | Temp 97.7°F | Resp 18 | Wt 160.1 lb

## 2022-10-05 DIAGNOSIS — C221 Intrahepatic bile duct carcinoma: Secondary | ICD-10-CM

## 2022-10-05 DIAGNOSIS — Z95828 Presence of other vascular implants and grafts: Secondary | ICD-10-CM

## 2022-10-05 DIAGNOSIS — Z5112 Encounter for antineoplastic immunotherapy: Secondary | ICD-10-CM | POA: Diagnosis not present

## 2022-10-05 LAB — CBC WITH DIFFERENTIAL/PLATELET
Abs Immature Granulocytes: 0.03 10*3/uL (ref 0.00–0.07)
Basophils Absolute: 0 10*3/uL (ref 0.0–0.1)
Basophils Relative: 1 %
Eosinophils Absolute: 0.2 10*3/uL (ref 0.0–0.5)
Eosinophils Relative: 4 %
HCT: 31.8 % — ABNORMAL LOW (ref 39.0–52.0)
Hemoglobin: 10.7 g/dL — ABNORMAL LOW (ref 13.0–17.0)
Immature Granulocytes: 1 %
Lymphocytes Relative: 16 %
Lymphs Abs: 1 10*3/uL (ref 0.7–4.0)
MCH: 32.6 pg (ref 26.0–34.0)
MCHC: 33.6 g/dL (ref 30.0–36.0)
MCV: 97 fL (ref 80.0–100.0)
Monocytes Absolute: 0.8 10*3/uL (ref 0.1–1.0)
Monocytes Relative: 13 %
Neutro Abs: 4.1 10*3/uL (ref 1.7–7.7)
Neutrophils Relative %: 65 %
Platelets: 105 10*3/uL — ABNORMAL LOW (ref 150–400)
RBC: 3.28 MIL/uL — ABNORMAL LOW (ref 4.22–5.81)
RDW: 14.2 % (ref 11.5–15.5)
WBC: 6.1 10*3/uL (ref 4.0–10.5)
nRBC: 0 % (ref 0.0–0.2)

## 2022-10-05 LAB — COMPREHENSIVE METABOLIC PANEL
ALT: 53 U/L — ABNORMAL HIGH (ref 0–44)
AST: 75 U/L — ABNORMAL HIGH (ref 15–41)
Albumin: 2.5 g/dL — ABNORMAL LOW (ref 3.5–5.0)
Alkaline Phosphatase: 297 U/L — ABNORMAL HIGH (ref 38–126)
Anion gap: 10 (ref 5–15)
BUN: 21 mg/dL (ref 8–23)
CO2: 21 mmol/L — ABNORMAL LOW (ref 22–32)
Calcium: 8.5 mg/dL — ABNORMAL LOW (ref 8.9–10.3)
Chloride: 99 mmol/L (ref 98–111)
Creatinine, Ser: 1.44 mg/dL — ABNORMAL HIGH (ref 0.61–1.24)
GFR, Estimated: 54 mL/min — ABNORMAL LOW (ref >60)
Glucose, Bld: 163 mg/dL — ABNORMAL HIGH (ref 70–99)
Potassium: 4.6 mmol/L (ref 3.5–5.1)
Sodium: 130 mmol/L — ABNORMAL LOW (ref 135–145)
Total Bilirubin: 2.4 mg/dL — ABNORMAL HIGH (ref 0.3–1.2)
Total Protein: 6.5 g/dL (ref 6.5–8.1)

## 2022-10-05 LAB — MAGNESIUM: Magnesium: 2 mg/dL (ref 1.7–2.4)

## 2022-10-05 MED ORDER — INTRASITE GEL APPLIPAK EX GEL: 1.0000 [IU] | Freq: Every day | CUTANEOUS | 0 refills | Status: DC

## 2022-10-05 MED ORDER — SODIUM CHLORIDE 0.9% FLUSH: 10.0000 mL | Freq: Once | INTRAVENOUS | Status: DC

## 2022-10-05 MED ORDER — PALONOSETRON HCL INJECTION 0.25 MG/5ML
0.2500 mg | Freq: Once | INTRAVENOUS | Status: AC
  Administered 2022-10-05: 0.25 mg via INTRAVENOUS
  Filled 2022-10-05: qty 5

## 2022-10-05 MED ORDER — SODIUM CHLORIDE 0.9 % IV SOLN
10.0000 mg | Freq: Once | INTRAVENOUS | Status: AC
  Administered 2022-10-05: 10 mg via INTRAVENOUS
  Filled 2022-10-05: qty 10

## 2022-10-05 MED ORDER — HEPARIN SOD (PORK) LOCK FLUSH 100 UNIT/ML IV SOLN
250.0000 [IU] | Freq: Once | INTRAVENOUS | Status: AC | PRN
  Administered 2022-10-05: 250 [IU]

## 2022-10-05 MED ORDER — SODIUM CHLORIDE 0.9 % IV SOLN
400.0000 mg/m2 | Freq: Once | INTRAVENOUS | Status: AC
  Administered 2022-10-05: 760 mg via INTRAVENOUS
  Filled 2022-10-05: qty 19.99

## 2022-10-05 MED ORDER — SODIUM CHLORIDE 0.9 % IV SOLN
150.0000 mg | Freq: Once | INTRAVENOUS | Status: AC
  Administered 2022-10-05: 150 mg via INTRAVENOUS
  Filled 2022-10-05: qty 150

## 2022-10-05 MED ORDER — POTASSIUM CHLORIDE IN NACL 20-0.9 MEQ/L-% IV SOLN
Freq: Once | INTRAVENOUS | Status: AC
  Filled 2022-10-05: qty 1000

## 2022-10-05 MED ORDER — SODIUM CHLORIDE 0.9% FLUSH
10.0000 mL | INTRAVENOUS | Status: DC | PRN
  Administered 2022-10-05: 10 mL

## 2022-10-05 MED ORDER — SODIUM CHLORIDE 0.9 % IV SOLN: Freq: Once | INTRAVENOUS | Status: AC

## 2022-10-05 MED ORDER — SODIUM CHLORIDE 0.9 % IV SOLN: INTRAVENOUS | Status: DC

## 2022-10-05 MED ORDER — SODIUM CHLORIDE 0.9 % IV SOLN
25.0000 mg/m2 | Freq: Once | INTRAVENOUS | Status: AC
  Administered 2022-10-05: 50 mg via INTRAVENOUS
  Filled 2022-10-05: qty 50

## 2022-10-05 MED ORDER — SODIUM CHLORIDE 0.9 % IV SOLN
1500.0000 mg | Freq: Once | INTRAVENOUS | Status: AC
  Administered 2022-10-05: 1500 mg via INTRAVENOUS
  Filled 2022-10-05: qty 30

## 2022-10-05 MED ORDER — MAGNESIUM SULFATE 2 GM/50ML IV SOLN
2.0000 g | Freq: Once | INTRAVENOUS | Status: AC
  Administered 2022-10-05: 2 g via INTRAVENOUS
  Filled 2022-10-05: qty 50

## 2022-10-05 NOTE — Patient Instructions (Addendum)
[place] at [facility] Discharge Instructions   You were seen and examined today by Dr. Delton Coombes.  He reviewed the results of lab work which are normal/stable. Your kidney function is slightly elevated. We will give you extra fluid in the clinic today.   We will proceed with your treatment today as long as the rest of your lab work is okay for treatment.   Return as scheduled.    Thank you for choosing [place] at [facility] to provide your oncology and hematology care.  To afford each patient quality time with our provider, please arrive at least 15 minutes before your scheduled appointment time.   If you have a lab appointment with the [facility] please come in thru the Main Entrance and check in at the main information desk.  You need to re-schedule your appointment should you arrive 10 or more minutes late.  We strive to give you quality time with our providers, and arriving late affects you and other patients whose appointments are after yours.  Also, if you no show three or more times for appointments you may be dismissed from the clinic at the providers discretion.     Again, thank you for choosing [facility].  Our hope is that these requests will decrease the amount of time that you wait before being seen by our physicians.       _____________________________________________________________  Should you have questions after your visit to [facility], please contact our office at [phone] and follow the prompts.  Our office hours are 8:00 a.m. and 4:30 p.m. Monday - Friday.  Please note that voicemails left after 4:00 p.m. may not be returned until the following business day.  We are closed weekends and major holidays.  You do have access to a nurse 24-7, just call the main number to the clinic [phone] and do not press any options, hold on the line and a nurse will answer the phone.    For  prescription refill requests, have your pharmacy contact our office and allow 72 hours.    Due to Covid, you will need to wear a mask upon entering the hospital. If you do not have a mask, a mask will be given to you at the Main Entrance upon arrival. For doctor visits, patients may have 1 support person age 110 or older with them. For treatment visits, patients can not have anyone with them due to social distancing guidelines and our immunocompromised population.

## 2022-10-05 NOTE — Progress Notes (Signed)
[place] [address], [place] [address]    Clinic Day:  10/05/2022  Referring physician: Redmond School, MD  Patient Care Team: Redmond School, MD as PCP - General (Internal Medicine) Chalmers Guest, MD as PCP - Cardiology (Cardiology) Derek Jack, MD as Medical Oncologist (Medical Oncology)   ASSESSMENT & PLAN:   Assessment: Metastatic cholangiocarcinoma: - 02/2021 initial diagnosis presentation with painless jaundice - ERCP with sphincterotomy, subsequent ERCP with plastic biliary stent placement. - MRI-2 cm mass involving the common hepatic duct with upstream biliary dilatation. - 03/2021 consult at [facility] with GI and surgery-confirm diagnosis of perihilar cholangiocarcinoma - 03/21/2021-biopsy/pathology, ERCP-common hepatic duct cytology and brushings performed.  EUS-1 enlarged lymph node versus tumor implant in the gastrohepatic ligament-negative for malignancy.  Single biliary plastic stent removed and replaced with the plastic stent in the right and left ducts. - 04/17/2021-right portal vein embolization to promote left hepatic hypertrophy. - 07/02/2021-diagnostic laparoscopy with multiple areas concerning for metastatic blocks identified in the abdomen and peritoneal surfaces.  6 separate biopsies obtained. - Pathology consistent with metastatic carcinoma. - 37 pound weight loss in the last 5 months. - Cycle 1 of gemcitabine, cisplatin, durvalumab on 07/22/2021.  2.  Social history/family: - Lives at home with his wife.  He worked as a Development worker, community. - Current active smoker, 1 pack/day since age 38. - Maternal grandmother had cancer.  Maternal uncle had brain tumor.    Plan: Metastatic cholangiocarcinoma: - CT CAP on 08/28/2022 consistent with development of peritoneal carcinomatosis.  Liver lesions stable. - He had port removed due to skin breakdown.  He is having dressing changes.  He has PICC line placed on the right  side. - Recent cystoscopy on 09/21/2022 showed 1.5 cm bladder stone, contributing to his suprapubic pain.  He is scheduled for lithotripsy on 11/19/2022. - I reviewed labs today which showed AST and ALT elevated at 75 and 53 respectively.  These have improved from last time.  Creatinine is 1.44.  Platelet count improved to 105.  Total bilirubin improved to 2.4 from 3.5.  He will proceed with chemoimmunotherapy today.  We will give gemcitabine at 400 mg/m dose and cisplatin 25 mg/m. - I will also send NGS testing. - RTC 2 weeks for follow-up for day 15. - I have encouraged him to increase water intake.   2.  Weight loss: - He is eating well and continuing to gain weight.  3.  Left ventricular thrombus (Dx 06/04/2022):: - Coumadin discontinued by cardiology on 07/18/2023 as repeat echo did not show LV thrombus.  Continue aspirin 81 mg daily.  4.  Hypotension: - Blood pressure is 90/59.  He is off of midodrine.  Orders Placed This Encounter  Procedures   CBC with Differential    Standing Status:   Future    Standing Expiration Date:   11/03/2023   Comprehensive metabolic panel    Standing Status:   Future    Standing Expiration Date:   11/03/2023   Magnesium    Standing Status:   Future    Standing Expiration Date:   11/03/2023   CBC with Differential    Standing Status:   Future    Standing Expiration Date:   11/17/2023   Comprehensive metabolic panel    Standing Status:   Future    Standing Expiration Date:   11/17/2023   Magnesium    Standing Status:   Future    Standing Expiration Date:   11/17/2023  CBC with Differential    Standing Status:   Future    Standing Expiration Date:   12/01/2023   Comprehensive metabolic panel    Standing Status:   Future    Standing Expiration Date:   12/01/2023   Magnesium    Standing Status:   Future    Standing Expiration Date:   12/01/2023   CBC with Differential    Standing Status:   Future    Standing Expiration Date:   12/15/2023    Comprehensive metabolic panel    Standing Status:   Future    Standing Expiration Date:   12/15/2023   Magnesium    Standing Status:   Future    Standing Expiration Date:   12/15/2023        Derek Jack, MD   2/26/20248:48 AM  CHIEF COMPLAINT:   Diagnosis: intrahepatic cholangiocarcinoma    Cancer Staging  Cholangiocarcinoma [facility]) Staging form: Perihilar Bile Ducts, AJCC 8th Edition - Clinical stage from 07/17/2021: Stage IVB (cTX, cNX, pM1) - Signed by Derek Jack, MD on 07/17/2021    Prior Therapy: None  Current Therapy:  Cisplatin + Gemcitabine D1, D15 q 28d    HISTORY OF PRESENT ILLNESS:   Oncology History  Cholangiocarcinoma ([place])  02/28/2021 Initial Diagnosis   Cholangiocarcinoma ([place])   07/17/2021 Cancer Staging   Staging form: Perihilar Bile Ducts, AJCC 8th Edition - Clinical stage from 07/17/2021: Stage IVB (cTX, cNX, pM1) - Signed by Derek Jack, MD on 07/17/2021 Stage prefix: Initial diagnosis   07/22/2021 - 04/01/2022 Chemotherapy   Patient is on Treatment Plan : BILIARY TRACT Cisplatin + Gemcitabine D1,15 + Imfinzi day 1 q28d     07/22/2021 -  Chemotherapy   Patient is on Treatment Plan : BILIARY TRACT IMFINZI D1  +  Cisplatin + Gemcitabine  D1, 15 q28d        INTERVAL HISTORY:   Jonathan Keith is a 67 y.o. male seen for follow-up of cholangiocarcinoma and toxicity management prior to chemotherapy.  Reports energy levels of 40% and appetite are 75%.  Has some urinary problems from bladder stone.  Chronic dry cough is stable.  He is drinking about 3 bottles of water daily.   PAST MEDICAL HISTORY:   Past Medical History: Past Medical History:  Diagnosis Date   Cancer ([place])    CHF (congestive heart failure) (HCC)    Chronic pain    neck, back, knees   Class 1 obesity due to excess calories with body mass index (BMI) of 31.0 to 31.9 in adult    Claudication [facility])    Coronary artery disease    DDD (degenerative disc disease), cervical     Diabetes mellitus ([place])    Dyslipidemia    Hypertension    Port-A-Cath in place 07/20/2021   Tobacco dependence     Surgical History: Past Surgical History:  Procedure Laterality Date   BILIARY BRUSHING N/A 02/25/2021   Procedure: BILIARY BRUSHING;  Surgeon: Rogene Houston, MD;  Location: [facility];  Service: Gastroenterology;  Laterality: N/A;   BILIARY STENT PLACEMENT N/A 02/25/2021   Procedure: BILIARY STENT PLACEMENT;  Surgeon: Rogene Houston, MD;  Location: [facility];  Service: Gastroenterology;  Laterality: N/A;   BILIARY STENT PLACEMENT  02/27/2021   Procedure: BILIARY STENT PLACEMENT (10FR x 9cm) IN THE RIGHT SYSTEM;  Surgeon: Rogene Houston, MD;  Location: [facility];  Service: Gastroenterology;;   BREAST SURGERY Left    benign lump- in his 89s.   CARDIAC CATHETERIZATION  ERCP N/A 02/25/2021   Procedure: ENDOSCOPIC RETROGRADE CHOLANGIOPANCREATOGRAPHY (ERCP);  Surgeon: Rogene Houston, MD;  Location: [facility];  Service: Gastroenterology;  Laterality: N/A;   ERCP N/A 02/27/2021   Procedure: ENDOSCOPIC RETROGRADE CHOLANGIOPANCREATOGRAPHY (ERCP);  Surgeon: Rogene Houston, MD;  Location: [facility];  Service: Gastroenterology;  Laterality: N/A;   IR IMAGING GUIDED PORT INSERTION  07/21/2021   IR REMOVAL TUN ACCESS W/ PORT W/O FL MOD SED  09/30/2022   KNEE SURGERY Left    x2   LEFT HEART CATH AND CORONARY ANGIOGRAPHY N/A 09/08/2021   Procedure: LEFT HEART CATH AND CORONARY ANGIOGRAPHY;  Surgeon: Early Osmond, MD;  Location: [place] [facility];  Service: Cardiovascular;  Laterality: N/A;   SPHINCTEROTOMY N/A 02/25/2021   Procedure: SPHINCTEROTOMY;  Surgeon: Rogene Houston, MD;  Location: [facility];  Service: Gastroenterology;  Laterality: N/A;   STENT REMOVAL  02/27/2021   Procedure: STENT REMOVAL (8.5Fr x 9cm);  Surgeon: Rogene Houston, MD;  Location: [facility];  Service: Gastroenterology;;    Social History: Social History   Socioeconomic History   Marital status: Married     Spouse name: Not on file   Number of children: Not on file   Years of education: Not on file   Highest education level: Not on file  Occupational History   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  Tobacco Use   Smoking status: Every Day    Packs/day: 0.50    Years: 56.00    Total pack years: 28.00    Types: Cigarettes   Smokeless tobacco: Never  Vaping Use   Vaping Use: Never used  Substance and Sexual Activity   Alcohol use: Not Currently    Comment: stopped 2.5 years ago 03/11/21   Drug use: Never   Sexual activity: Not on file  Other Topics Concern   Not on file  Social History Narrative   Not on file   Social Determinants of Health   Financial Resource Strain: Medium Risk (03/07/2021)   Overall Financial Resource Strain (CARDIA)    Difficulty of Paying Living Expenses: Somewhat hard  Food Insecurity: No Food Insecurity (03/07/2021)   Hunger Vital Sign    Worried About Running Out of Food in the Last Year: Never true    [place] in the Last Year: Never true  Transportation Needs: No Transportation Needs (03/07/2021)   PRAPARE - Hydrologist (Medical): No    Lack of Transportation (Non-Medical): No  Physical Activity: Insufficiently Active (03/07/2021)   Exercise Vital Sign    Days of Exercise per Week: 2 days    Minutes of Exercise per Session: 20 min  Stress: No Stress Concern Present (03/07/2021)   [place]    Feeling of Stress : Only a little  Social Connections: Moderately Integrated (03/07/2021)   Social Connection and Isolation Panel [NHANES]    Frequency of Communication with Friends and Family: More than three times a week    Frequency of Social Gatherings with Friends and Family: More than three times a week    Attends Religious Services: More than 4 times per year    Active Member of [employer] or Organizations: No    Attends Archivist Meetings: Never     Marital Status: Married  Human resources officer Violence: Not At Risk (03/07/2021)   Humiliation, Afraid, Rape, and Kick questionnaire    Fear of Current or Ex-Partner: No    Emotionally  Abused: No    Physically Abused: No    Sexually Abused: No    Family History: Family History  Problem Relation Age of Onset   CAD Mother    Cancer Neg Hx     Current Medications:  Current Outpatient Medications:    acetaminophen (TYLENOL) 500 MG tablet, Take 500 mg by mouth every 6 (six) hours as needed for moderate pain or headache., Disp: , Rfl:    albuterol (VENTOLIN HFA) 108 (90 Base) MCG/ACT inhaler, Inhale 2 puffs into the lungs every 6 (six) hours as needed for wheezing or shortness of breath., Disp: 6.7 g, Rfl: 1   alfuzosin (UROXATRAL) 10 MG 24 hr tablet, Take 1 tablet (10 mg total) by mouth at bedtime., Disp: 30 tablet, Rfl: 11   atorvastatin (LIPITOR) 40 MG tablet, Take 1 tablet (40 mg total) by mouth daily at 6 PM., Disp: 90 tablet, Rfl: 3   carvedilol (COREG) 6.25 MG tablet, Take 1 tablet (6.25 mg total) by mouth 2 (two) times daily., Disp: 180 tablet, Rfl: 3   CISPLATIN IV, Inject into the vein once a week. Days 1, 8 every 21 days, Disp: , Rfl:    Durvalumab (IMFINZI IV), Inject into the vein every 21 ( twenty-one) days., Disp: , Rfl:    famotidine (PEPCID) 20 MG tablet, Take 20 mg by mouth 2 (two) times daily., Disp: , Rfl:    ferrous sulfate (FERROUSUL) 325 (65 FE) MG tablet, Take 1 tablet (325 mg total) by mouth daily with breakfast., Disp: , Rfl:    Gemcitabine HCl (GEMZAR IV), Inject into the vein once a week. Days 1, 8 every 21 days, Disp: , Rfl:    Ginseng 100 MG CAPS, Take 1 capsule by mouth daily., Disp: , Rfl:    lidocaine-prilocaine (EMLA) cream, , Disp: , Rfl:    lisinopril (ZESTRIL) 2.5 MG tablet, Take 1 tablet (2.5 mg total) by mouth daily., Disp: 90 tablet, Rfl: 3   loratadine (CLARITIN) 10 MG tablet, Take 10 mg by mouth daily. Takes for a few days after shot to boost WBC.  About every 2nd treatment, Disp: , Rfl:    pantoprazole (PROTONIX) 40 MG tablet, Take 1 tablet (40 mg total) by mouth daily., Disp: 90 tablet, Rfl: 3   prochlorperazine (COMPAZINE) 10 MG tablet, TAKE (1) TABLET BY MOUTH EVERY (6) HOURS AS NEEDED., Disp: 90 tablet, Rfl: 0   tamsulosin (FLOMAX) 0.4 MG CAPS capsule, TAKE (2) CAPSULES BY MOUTH AT BEDTIME., Disp: 60 capsule, Rfl: 6   Vitamin D, Ergocalciferol, (DRISDOL) 1.25 MG (50000 UNIT) CAPS capsule, Take 1 capsule by mouth once a week., Disp: , Rfl:    Wound Dressings (INTRASITE GEL APPLIPAK) GEL, Apply 1 Units topically daily., Disp: 30 g, Rfl: 0   metFORMIN (GLUCOPHAGE) 500 MG tablet, Take 1 tablet (500 mg total) by mouth 2 (two) times daily with a meal., Disp: 60 tablet, Rfl: 0   nitroGLYCERIN (NITROSTAT) 0.4 MG SL tablet, Place 1 tablet (0.4 mg total) under the tongue every 5 (five) minutes as needed for chest pain., Disp: 25 tablet, Rfl: 3   Allergies: No Known Allergies  REVIEW OF SYSTEMS:   Review of Systems  Constitutional:  Negative for chills, fatigue and fever.  Respiratory:  Positive for cough.   Genitourinary:  Positive for difficulty urinating (Bladder Stone). Negative for dysuria and frequency.   All other systems reviewed and are negative.    VITALS:   There were no vitals taken for this visit.  Wt Readings  from Last 3 Encounters:  10/05/22 160 lb 1.6 oz (72.6 kg)  09/30/22 157 lb (71.2 kg)  09/28/22 158 lb 12.8 oz (72 kg)    There is no height or weight on file to calculate BMI.  Performance status (ECOG): 1 - Symptomatic but completely ambulatory  PHYSICAL EXAM:   Physical Exam Vitals and nursing note reviewed. Exam conducted with a chaperone present.  Constitutional:      Appearance: Normal appearance.  Cardiovascular:     Rate and Rhythm: Normal rate and regular rhythm.     Pulses: Normal pulses.     Heart sounds: Normal heart sounds.  Pulmonary:     Effort: Pulmonary effort is normal.     Breath  sounds: Normal breath sounds.  Abdominal:     Palpations: Abdomen is soft. There is no hepatomegaly, splenomegaly or mass.     Tenderness: There is no abdominal tenderness.  Musculoskeletal:     Right lower leg: No edema.     Left lower leg: No edema.  Lymphadenopathy:     Cervical: No cervical adenopathy.     Right cervical: No superficial, deep or posterior cervical adenopathy.    Left cervical: No superficial, deep or posterior cervical adenopathy.     Upper Body:     Right upper body: No supraclavicular or axillary adenopathy.     Left upper body: No supraclavicular or axillary adenopathy.  Neurological:     General: No focal deficit present.     Mental Status: He is alert and oriented to person, place, and time.  Psychiatric:        Mood and Affect: Mood normal.        Behavior: Behavior normal.     LABS:      Latest Ref Rng & Units 10/05/2022    7:49 AM 09/28/2022   10:04 AM 09/01/2022    9:15 AM  CBC  WBC 4.0 - 10.5 K/uL 6.1  5.9  5.0   Hemoglobin 13.0 - 17.0 g/dL 10.7  11.4  12.0   Hematocrit 39.0 - 52.0 % 31.8  33.2  35.7   Platelets 150 - 400 K/uL 105  85  83       Latest Ref Rng & Units 09/28/2022   10:04 AM 09/01/2022    9:15 AM 08/05/2022    8:10 AM  CMP  Glucose 70 - 99 mg/dL 149  135  144   BUN 8 - 23 mg/dL '18  18  12   '$ Creatinine 0.61 - 1.24 mg/dL 1.39  1.38  1.25   Sodium 135 - 145 mmol/L 128  134  133   Potassium 3.5 - 5.1 mmol/L 4.4  4.2  4.3   Chloride 98 - 111 mmol/L 98  103  103   CO2 22 - 32 mmol/L '21  23  23   '$ Calcium 8.9 - 10.3 mg/dL 8.5  8.6  8.3   Total Protein 6.5 - 8.1 g/dL 6.6  6.9  6.5   Total Bilirubin 0.3 - 1.2 mg/dL 3.5  1.4  1.3   Alkaline Phos 38 - 126 U/L 268  263  252   AST 15 - 41 U/L 91  58  65   ALT 0 - 44 U/L 69  42  47      Lab Results  Component Value Date   CEA1 3.9 07/17/2021   /  CEA  Date Value Ref Range Status  07/17/2021 3.9 0.0 - 4.7 ng/mL Final    Comment:    (  NOTE)                             Nonsmokers           <3.9                             Smokers             <5.6 Roche [employer]ostics Electrochemiluminescence Immunoassay (ECLIA) Values obtained with different assay methods or kits cannot be used interchangeably.  Results cannot be interpreted as absolute evidence of the presence or absence of malignant disease. Performed At: [facility] [place], [place] [id number] Rush Farmer MD [id number]    No results found for: "PSA1" Lab Results  Component Value Date   [id number] (H) 09/28/2022   No results found for: "CAN125"  No results found for: "TOTALPROTELP", "ALBUMINELP", "A1GS", "A2GS", "BETS", "BETA2SER", "GAMS", "MSPIKE", "SPEI" Lab Results  Component Value Date   TIBC 139 (L) 09/12/2021   TIBC 154 (L) 03/13/2021   FERRITIN 1,132 (H) 09/12/2021   FERRITIN 708 (H) 03/13/2021   IRONPCTSAT 16 (L) 09/12/2021   IRONPCTSAT 17 (L) 03/13/2021   No results found for: "LDH"   STUDIES:   IR PICC PLACEMENT RIGHT >5 YRS INC IMG GUIDE  Result Date: 09/30/2022 INDICATION: Cholangiocarcinoma, receiving chemotherapy, exposed eroded port catheter had to be removed. PICC line placed for continued chemotherapy. EXAM: ULTRASOUND AND FLUOROSCOPIC GUIDED PICC LINE INSERTION MEDICATIONS: 1% lidocaine local CONTRAST:  None FLUOROSCOPY TIME:  Six seconds (1 mGy) COMPLICATIONS: None immediate. TECHNIQUE: The procedure, risks, benefits, and alternatives were explained to the patient and informed written consent was obtained. A timeout was performed prior to the initiation of the procedure. The right upper extremity was prepped with chlorhexidine in a sterile fashion, and a sterile drape was applied covering the operative field. Maximum barrier sterile technique with sterile gowns and gloves were used for the procedure. A timeout was performed prior to the initiation of the procedure. Local anesthesia was provided with 1% lidocaine. Under direct ultrasound guidance, the right  basilic vein was accessed with a micropuncture kit after the overlying soft tissues were anesthetized with 1% lidocaine. An ultrasound image was saved for documentation purposes. A guidewire was advanced to the level of the superior caval-atrial junction for measurement purposes and the PICC line was cut to length. A peel-away sheath was placed and a 39 cm, 5 [place], single lumen was inserted to level of the superior caval-atrial junction. A post procedure spot fluoroscopic was obtained. The catheter easily aspirated and flushed and was sutured in place. A dressing was placed. The patient tolerated the procedure well without immediate post procedural complication. FINDINGS: After catheter placement, the tip lies within the superior cavoatrial junction. The catheter aspirates and flushes normally and is ready for immediate use. IMPRESSION: Successful ultrasound and fluoroscopic guided placement of a right basilic vein approach, 39 cm, 5 French, single lumen PICC with tip at the superior caval-atrial junction. The PICC line is ready for immediate use. Electronically Signed   By: Jerilynn Mages.  Shick M.D.   On: 09/30/2022 15:23   IR REMOVAL TUN ACCESS W/ PORT W/O FL MOD SED  Result Date: 09/30/2022 CLINICAL DATA:  Eroded exposed port site at the skin EXAM: REMOVAL OF IMPLANTED TUNNELED PORT-A-CATH MEDICATIONS: 1% lidocaine local with epinephrine. ANESTHESIA/SEDATION: Moderate (conscious) sedation was employed during this procedure. A total of Versed  1.5 mg and Fentanyl 75 mcg was administered intravenously. Moderate Sedation Time: 17 minutes. The patient's level of consciousness and vital signs were monitored continuously by radiology nursing throughout the procedure under my direct supervision. FLUOROSCOPY TIME:  None PROCEDURE: Informed written consent was obtained from the patient after a discussion of the risk, benefits and alternatives to the procedure. The patient was positioned supine on the fluoroscopy table and the  right chest Port-A-Cath site was prepped with chlorhexidine. A sterile gown and gloves were worn during the procedure. Local anesthesia was provided with 1% lidocaine with epinephrine. A timeout was performed prior to the initiation of the procedure. An incision was made overlying the eroded exposed Port-A-Cath with a #15 scalpel. Utilizing sharp and blunt dissection, the Port-A-Cath was removed completely. The pocked was irrigated with sterile saline. Hemostasis obtained. Wound was left open and packed with iodoform gauze to heal by secondary intention. The patient tolerated the procedure well without immediate post procedural complication. FINDINGS: Successful removal of implant Port-A-Cath without immediate post procedural complication. IMPRESSION: Successful removal of implanted Port-A-Cath. Wound was left open and packed with iodoform gauze to heal by secondary intention. Electronically Signed   By: Jerilynn Mages.  Shick M.D.   On: 09/30/2022 15:22   ECHOCARDIOGRAM LIMITED  Result Date: 09/14/2022    ECHOCARDIOGRAM LIMITED REPORT   Patient Name:   Jonathan Keith Date of Exam: 09/14/2022 Medical Rec #:  [id number]       Height:       71.0 in Accession #:    [id number]      Weight:       158.0 lb Date of Birth:  09-27-1955       BSA:          1.908 m Patient Age:    32 years        BP:           118/70 mmHg Patient Gender: M               HR:           76 bpm. Exam Location:  [facility] Procedure: Limited Echo and Intracardiac Opacification Agent Indications:    Thrombus in heart chamber [id number]  History:        Patient has prior history of Echocardiogram examinations, most                 recent 09/09/2022. Cardiomyopathy, Thrombus and CHF, CAD and                 NSTEMI; Risk Factors:Hypertension, Current Smoker, Dyslipidemia                 and Diabetes.  Sonographer:    Greer Pickerel Referring Phys: [id number] VISHNU P MALLIPEDDI  Sonographer Comments: Image acquisition challenging due to respiratory motion. IMPRESSIONS   1. 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 The very distal apex is akinetic. There is dense slow moving circulating blood flow in the apex without clear apical thrombus. Left ventricular ejection fraction, by estimation, is 45 to 50%.  The left ventricle has mildly decreased function. The left ventricle demonstrates regional wall motion abnormalities (see scoring diagram/findings for description).  2. 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 The very distal apex is akinetic. There is dense slow moving circulating blood flow in the apex without clear apical thrombus. Left ventricular ejection fraction, by estimation, is  45 to 50%. The left ventricle has mildly decreased function. The left ventricle demonstrates regional wall motion abnormalities. Definity contrast agent was given IV to delineate the left ventricular endocardial borders. LEFT VENTRICLE PLAX 2D LVIDd:         5.20 cm LVIDs:         3.90 cm LV PW:         1.00 cm LV IVS:        1.00 cm  Carlyle Dolly MD Electronically signed by Carlyle Dolly MD Signature Date/Time: 09/14/2022/10:16:14 AM    Final    ECHOCARDIOGRAM COMPLETE  Result Date: 09/09/2022    ECHOCARDIOGRAM REPORT   Patient Name:   Jonathan Keith Date of Exam: 09/09/2022 Medical Rec #:  [id number]       Height:       69.0 in Accession #:    [id number]      Weight:       157.4 lb Date of Birth:  20-Sep-1955       BSA:          1.866 m Patient Age:    35 years        BP:           114/73 mmHg Patient Gender: M               HR:           74 bpm. Exam Location:  [facility] Procedure: 2D Echo, Cardiac Doppler and Color Doppler Indications:    I50.22 (ICD-10-CM) - Chronic systolic heart failure  History:        Patient has prior history of Echocardiogram examinations, most                 recent 06/12/2022. CHF, Previous Myocardial Infarction; Risk                 Factors:Hypertension, Diabetes,  Dyslipidemia and Current Smoker.                 Hx of Left ventricular thrombus, Port-A-Cath in place,                 Cholangiocarcinoma.  Sonographer:    Alvino Chapel RCS Referring Phys: [id number] [place]  1. Left ventricular ejection fraction, by estimation, is 45 to 50%. The left ventricle has mildly decreased function. Left ventricular endocardial border not optimally defined to evaluate regional wall motion. Left ventricular diastolic parameters are consistent with Grade I diastolic dysfunction (impaired relaxation).  2. Right ventricular systolic function was not well visualized. The right ventricular size is normal. Tricuspid regurgitation signal is inadequate for assessing PA pressure.  3. The mitral valve is normal in structure. Trivial mitral valve regurgitation. No evidence of mitral stenosis.  4. The aortic valve is tricuspid. There is mild calcification of the aortic valve. Aortic valve regurgitation is not visualized. No aortic stenosis is present.  5. The inferior vena cava is normal in size with greater than 50% respiratory variability, suggesting right atrial pressure of 3 mmHg. Comparison(s): No significant change from prior study. FINDINGS  Left Ventricle: Left ventricular ejection fraction, by estimation, is 45 to 50%. The left ventricle has mildly decreased function. Left ventricular endocardial border not optimally defined to evaluate regional wall motion. The left ventricular internal cavity size was normal in size. There is no left ventricular hypertrophy. Left ventricular diastolic parameters are consistent with Grade I diastolic dysfunction (impaired relaxation). Right Ventricle: The right ventricular size is  normal. No increase in right ventricular wall thickness. Right ventricular systolic function was not well visualized. Tricuspid regurgitation signal is inadequate for assessing PA pressure. Left Atrium: Left atrial size was normal in size. Right Atrium: Right  atrial size was normal in size. Pericardium: Trivial pericardial effusion is present. Mitral Valve: The mitral valve is normal in structure. Trivial mitral valve regurgitation. No evidence of mitral valve stenosis. Tricuspid Valve: The tricuspid valve is not well visualized. Tricuspid valve regurgitation is not demonstrated. No evidence of tricuspid stenosis. Aortic Valve: The aortic valve is tricuspid. There is mild calcification of the aortic valve. Aortic valve regurgitation is not visualized. No aortic stenosis is present. Pulmonic Valve: The pulmonic valve was not well visualized. Pulmonic valve regurgitation is not visualized. No evidence of pulmonic stenosis. Aorta: The aortic root is normal in size and structure. Venous: The inferior vena cava is normal in size with greater than 50% respiratory variability, suggesting right atrial pressure of 3 mmHg. IAS/Shunts: No atrial level shunt detected by color flow Doppler.  LEFT VENTRICLE PLAX 2D LVIDd:         5.25 cm   Diastology LVIDs:         4.05 cm   LV e' medial:    4.46 cm/s LV PW:         1.05 cm   LV E/e' medial:  9.0 LV IVS:        0.95 cm   LV e' lateral:   8.16 cm/s LVOT diam:     2.20 cm   LV E/e' lateral: 4.9 LV SV:         63 LV SV Index:   34 LVOT Area:     3.80 cm  RIGHT VENTRICLE RV S prime:     6.96 cm/s TAPSE (M-mode): 1.5 cm LEFT ATRIUM             Index        RIGHT ATRIUM           Index LA diam:        3.40 cm 1.82 cm/m   RA Area:     16.50 cm LA Vol (A2C):   49.4 ml 26.47 ml/m  RA Volume:   42.10 ml  22.56 ml/m LA Vol (A4C):   44.5 ml 23.85 ml/m LA Biplane Vol: 50.3 ml 26.96 ml/m  AORTIC VALVE LVOT Vmax:   72.30 cm/s LVOT Vmean:  47.300 cm/s LVOT VTI:    0.166 m  AORTA Ao Root diam: 3.60 cm MITRAL VALVE MV Area (PHT): 2.22 cm    SHUNTS MV Decel Time: 342 msec    Systemic VTI:  0.17 m MV E velocity: 40.10 cm/s  Systemic Diam: 2.20 cm MV A velocity: 64.60 cm/s MV E/A ratio:  0.62 Vishnu Priya Mallipeddi Electronically signed by Lorelee Cover Mallipeddi Signature Date/Time: 09/09/2022/5:35:23 PM    Final

## 2022-10-05 NOTE — Progress Notes (Signed)
Patient has been examined by Dr. Delton Coombes. Vital signs and labs have been reviewed by MD - ANC, Creatinine, LFTs, hemoglobin, and platelets are within treatment parameters per M.D. - pt may proceed with treatment.  Primary RN and pharmacy notified.

## 2022-10-05 NOTE — Patient Instructions (Signed)
[facility]  Discharge Instructions: Thank you for choosing [facility] to provide your oncology and hematology care.  If you have a lab appointment with the [facility], please come in thru the Main Entrance and check in at the main information desk.  Wear comfortable clothing and clothing appropriate for easy access to any Portacath or PICC line.   We strive to give you quality time with your provider. You may need to reschedule your appointment if you arrive late (15 or more minutes).  Arriving late affects you and other patients whose appointments are after yours.  Also, if you miss three or more appointments without notifying the office, you may be dismissed from the clinic at the provider's discretion.      For prescription refill requests, have your pharmacy contact our office and allow 72 hours for refills to be completed.    Today you received the following chemotherapy and/or immunotherapy agents Imfinzi/Gemzar/Cisplatin.  Durvalumab Injection What is this medication? DURVALUMAB (dur VAL ue mab) treats some types of cancer. It works by helping your immune system slow or stop the spread of cancer cells. It is a monoclonal antibody. This medicine may be used for other purposes; ask your health care provider or pharmacist if you have questions. COMMON BRAND NAME(S): IMFINZI What should I tell my care team before I take this medication? They need to know if you have any of these conditions: Allogeneic stem cell transplant (uses someone else's stem cells) Autoimmune diseases, such as Crohn disease, ulcerative colitis, lupus History of chest radiation Nervous system problems, such as Guillain-Barre syndrome, myasthenia gravis Organ transplant An unusual or allergic reaction to durvalumab, other medications, foods, dyes, or preservatives Pregnant or trying to get pregnant Breast-feeding How should I use this medication? This medication is infused  into a vein. It is given by your care team in a hospital or clinic setting. A special MedGuide will be given to you before each treatment. Be sure to read this information carefully each time. Talk to your care team about the use of this medication in children. Special care may be needed. Overdosage: If you think you have taken too much of this medicine contact a poison control center or emergency room at once. NOTE: This medicine is only for you. Do not share this medicine with others. What if I miss a dose? Keep appointments for follow-up doses. It is important not to miss your dose. Call your care team if you are unable to keep an appointment. What may interact with this medication? Interactions have not been studied. This list may not describe all possible interactions. Give your health care provider a list of all the medicines, herbs, non-prescription drugs, or dietary supplements you use. Also tell them if you smoke, drink alcohol, or use illegal drugs. Some items may interact with your medicine. What should I watch for while using this medication? Your condition will be monitored carefully while you are receiving this medication. You may need blood work while taking this medication. This medication may cause serious skin reactions. They can happen weeks to months after starting the medication. Contact your care team right away if you notice fevers or flu-like symptoms with a rash. The rash may be red or purple and then turn into blisters or peeling of the skin. You may also notice a red rash with swelling of the face, lips, or lymph nodes in your neck or under your arms. Tell your care team right away  if you have any change in your eyesight. Talk to your care team if you may be pregnant. Serious birth defects can occur if you take this medication during pregnancy and for 3 months after the last dose. You will need a negative pregnancy test before starting this medication. Contraception is  recommended while taking this medication and for 3 months after the last dose. Your care team can help you find the option that works for you. Do not breastfeed while taking this medication and for 3 months after the last dose. What side effects may I notice from receiving this medication? Side effects that you should report to your care team as soon as possible: Allergic reactions--skin rash, itching, hives, swelling of the face, lips, tongue, or throat Dry cough, shortness of breath or trouble breathing Eye pain, redness, irritation, or discharge with blurry or decreased vision Heart muscle inflammation--unusual weakness or fatigue, shortness of breath, chest pain, fast or irregular heartbeat, dizziness, swelling of the ankles, feet, or hands Hormone gland problems--headache, sensitivity to light, unusual weakness or fatigue, dizziness, fast or irregular heartbeat, increased sensitivity to cold or heat, excessive sweating, constipation, hair loss, increased thirst or amount of urine, tremors or shaking, irritability Infusion reactions--chest pain, shortness of breath or trouble breathing, feeling faint or lightheaded Kidney injury (glomerulonephritis)--decrease in the amount of urine, red or dark Jonathan Keith urine, foamy or bubbly urine, swelling of the ankles, hands, or feet Liver injury--right upper belly pain, loss of appetite, nausea, light-colored stool, dark yellow or Jonathan Keith urine, yellowing skin or eyes, unusual weakness or fatigue Pain, tingling, or numbness in the hands or feet, muscle weakness, change in vision, confusion or trouble speaking, loss of balance or coordination, trouble walking, seizures Rash, fever, and swollen lymph nodes Redness, blistering, peeling, or loosening of the skin, including inside the mouth Sudden or severe stomach pain, bloody diarrhea, fever, nausea, vomiting Side effects that usually do not require medical attention (report these to your care team if they continue  or are bothersome): Bone, joint, or muscle pain Diarrhea Fatigue Loss of appetite Nausea Skin rash This list may not describe all possible side effects. Call your doctor for medical advice about side effects. You may report side effects to FDA at 1-800-FDA-1088. Where should I keep my medication? This medication is given in a hospital or clinic. It will not be stored at home. NOTE: This sheet is a summary. It may not cover all possible information. If you have questions about this medicine, talk to your doctor, pharmacist, or health care provider.  2023 Elsevier/Gold Standard (2021-11-28 00:00:00)    Gemcitabine Injection What is this medication? GEMCITABINE (jem SYE ta been) treats some types of cancer. It works by slowing down the growth of cancer cells. This medicine may be used for other purposes; ask your health care provider or pharmacist if you have questions. COMMON BRAND NAME(S): Gemzar, Infugem What should I tell my care team before I take this medication? They need to know if you have any of these conditions: Blood disorders Infection Kidney disease Liver disease Lung or breathing disease, such as asthma or COPD Recent or ongoing radiation therapy An unusual or allergic reaction to gemcitabine, other medications, foods, dyes, or preservatives If you or your partner are pregnant or trying to get pregnant Breast-feeding How should I use this medication? This medication is injected into a vein. It is given by your care team in a hospital or clinic setting. Talk to your care team about the use of  this medication in children. Special care may be needed. Overdosage: If you think you have taken too much of this medicine contact a poison control center or emergency room at once. NOTE: This medicine is only for you. Do not share this medicine with others. What if I miss a dose? Keep appointments for follow-up doses. It is important not to miss your dose. Call your care team if  you are unable to keep an appointment. What may interact with this medication? Interactions have not been studied. This list may not describe all possible interactions. Give your health care provider a list of all the medicines, herbs, non-prescription drugs, or dietary supplements you use. Also tell them if you smoke, drink alcohol, or use illegal drugs. Some items may interact with your medicine. What should I watch for while using this medication? Your condition will be monitored carefully while you are receiving this medication. This medication may make you feel generally unwell. This is not uncommon, as chemotherapy can affect healthy cells as well as cancer cells. Report any side effects. Continue your course of treatment even though you feel ill unless your care team tells you to stop. In some cases, you may be given additional medications to help with side effects. Follow all directions for their use. This medication may increase your risk of getting an infection. Call your care team for advice if you get a fever, chills, sore throat, or other symptoms of a cold or flu. Do not treat yourself. Try to avoid being around people who are sick. This medication may increase your risk to bruise or bleed. Call your care team if you notice any unusual bleeding. Be careful brushing or flossing your teeth or using a toothpick because you may get an infection or bleed more easily. If you have any dental work done, tell your dentist you are receiving this medication. Avoid taking medications that contain aspirin, acetaminophen, ibuprofen, naproxen, or ketoprofen unless instructed by your care team. These medications may hide a fever. Talk to your care team if you or your partner wish to become pregnant or think you might be pregnant. This medication can cause serious birth defects if taken during pregnancy and for 6 months after the last dose. A negative pregnancy test is required before starting this  medication. A reliable form of contraception is recommended while taking this medication and for 6 months after the last dose. Talk to your care team about effective forms of contraception. Do not father a child while taking this medication and for 3 months after the last dose. Use a condom while having sex during this time period. Do not breastfeed while taking this medication and for at least 1 week after the last dose. This medication may cause infertility. Talk to your care team if you are concerned about your fertility. What side effects may I notice from receiving this medication? Side effects that you should report to your care team as soon as possible: Allergic reactions--skin rash, itching, hives, swelling of the face, lips, tongue, or throat Capillary leak syndrome--stomach or muscle pain, unusual weakness or fatigue, feeling faint or lightheaded, decrease in the amount of urine, swelling of the ankles, hands, or feet, trouble breathing Infection--fever, chills, cough, sore throat, wounds that don't heal, pain or trouble when passing urine, general feeling of discomfort or being unwell Liver injury--right upper belly pain, loss of appetite, nausea, light-colored stool, dark yellow or Jonathan Keith urine, yellowing skin or eyes, unusual weakness or fatigue Low red blood cell  level--unusual weakness or fatigue, dizziness, headache, trouble breathing Lung injury--shortness of breath or trouble breathing, cough, spitting up blood, chest pain, fever Stomach pain, bloody diarrhea, pale skin, unusual weakness or fatigue, decrease in the amount of urine, which may be signs of hemolytic uremic syndrome Sudden and severe headache, confusion, change in vision, seizures, which may be signs of posterior reversible encephalopathy syndrome (PRES) Unusual bruising or bleeding Side effects that usually do not require medical attention (report to your care team if they continue or are  bothersome): Diarrhea Drowsiness Hair loss Nausea Pain, redness, or swelling with sores inside the mouth or throat Vomiting This list may not describe all possible side effects. Call your doctor for medical advice about side effects. You may report side effects to FDA at 1-800-FDA-1088. Where should I keep my medication? This medication is given in a hospital or clinic. It will not be stored at home. NOTE: This sheet is a summary. It may not cover all possible information. If you have questions about this medicine, talk to your doctor, pharmacist, or health care provider.  2023 Elsevier/Gold Standard (2021-12-02 00:00:00)    Cisplatin Injection What is this medication? CISPLATIN (SIS pla tin) treats some types of cancer. It works by slowing down the growth of cancer cells. This medicine may be used for other purposes; ask your health care provider or pharmacist if you have questions. COMMON BRAND NAME(S): Platinol, Platinol -AQ What should I tell my care team before I take this medication? They need to know if you have any of these conditions: Eye disease, vision problems Hearing problems Kidney disease Low blood counts, such as low white cells, platelets, or red blood cells Tingling of the fingers or toes, or other nerve disorder An unusual or allergic reaction to cisplatin, carboplatin, oxaliplatin, other medications, foods, dyes, or preservatives If you or your partner are pregnant or trying to get pregnant Breast-feeding How should I use this medication? This medication is injected into a vein. It is given by your care team in a hospital or clinic setting. Talk to your care team about the use of this medication in children. Special care may be needed. Overdosage: If you think you have taken too much of this medicine contact a poison control center or emergency room at once. NOTE: This medicine is only for you. Do not share this medicine with others. What if I miss a  dose? Keep appointments for follow-up doses. It is important not to miss your dose. Call your care team if you are unable to keep an appointment. What may interact with this medication? Do not take this medication with any of the following: Live virus vaccines This medication may also interact with the following: Certain antibiotics, such as amikacin, gentamicin, neomycin, polymyxin B, streptomycin, tobramycin, vancomycin Foscarnet This list may not describe all possible interactions. Give your health care provider a list of all the medicines, herbs, non-prescription drugs, or dietary supplements you use. Also tell them if you smoke, drink alcohol, or use illegal drugs. Some items may interact with your medicine. What should I watch for while using this medication? Your condition will be monitored carefully while you are receiving this medication. You may need blood work done while taking this medication. This medication may make you feel generally unwell. This is not uncommon, as chemotherapy can affect healthy cells as well as cancer cells. Report any side effects. Continue your course of treatment even though you feel ill unless your care team tells you to stop.  This medication may increase your risk of getting an infection. Call your care team for advice if you get a fever, chills, sore throat, or other symptoms of a cold or flu. Do not treat yourself. Try to avoid being around people who are sick. Avoid taking medications that contain aspirin, acetaminophen, ibuprofen, naproxen, or ketoprofen unless instructed by your care team. These medications may hide a fever. This medication may increase your risk to bruise or bleed. Call your care team if you notice any unusual bleeding. Be careful brushing or flossing your teeth or using a toothpick because you may get an infection or bleed more easily. If you have any dental work done, tell your dentist you are receiving this medication. Drink fluids as  directed while you are taking this medication. This will help protect your kidneys. Call your care team if you get diarrhea. Do not treat yourself. Talk to your care team if you or your partner wish to become pregnant or think you might be pregnant. This medication can cause serious birth defects if taken during pregnancy and for 14 months after the last dose. A negative pregnancy test is required before starting this medication. A reliable form of contraception is recommended while taking this medication and for 14 months after the last dose. Talk to your care team about effective forms of contraception. Do not father a child while taking this medication and for 11 months after the last dose. Use a condom during sex during this time period. Do not breast-feed while taking this medication. This medication may cause infertility. Talk to your care team if you are concerned about your fertility. What side effects may I notice from receiving this medication? Side effects that you should report to your care team as soon as possible: Allergic reactions--skin rash, itching, hives, swelling of the face, lips, tongue, or throat Eye pain, change in vision, vision loss Hearing loss, ringing in ears Infection--fever, chills, cough, sore throat, wounds that don't heal, pain or trouble when passing urine, general feeling of discomfort or being unwell Kidney injury--decrease in the amount of urine, swelling of the ankles, hands, or feet Low red blood cell level--unusual weakness or fatigue, dizziness, headache, trouble breathing Painful swelling, warmth, or redness of the skin, blisters or sores at the infusion site Pain, tingling, or numbness in the hands or feet Unusual bruising or bleeding Side effects that usually do not require medical attention (report to your care team if they continue or are bothersome): Hair loss Nausea Vomiting This list may not describe all possible side effects. Call your doctor for  medical advice about side effects. You may report side effects to FDA at 1-800-FDA-1088. Where should I keep my medication? This medication is given in a hospital or clinic. It will not be stored at home. NOTE: This sheet is a summary. It may not cover all possible information. If you have questions about this medicine, talk to your doctor, pharmacist, or health care provider.  2023 Elsevier/Gold Standard (2021-11-21 00:00:00)        To help prevent nausea and vomiting after your treatment, we encourage you to take your nausea medication as directed.  BELOW ARE SYMPTOMS THAT SHOULD BE REPORTED IMMEDIATELY: *FEVER GREATER THAN 100.4 F (38 C) OR HIGHER *CHILLS OR SWEATING *NAUSEA AND VOMITING THAT IS NOT CONTROLLED WITH YOUR NAUSEA MEDICATION *UNUSUAL SHORTNESS OF BREATH *UNUSUAL BRUISING OR BLEEDING *URINARY PROBLEMS (pain or burning when urinating, or frequent urination) *BOWEL PROBLEMS (unusual diarrhea, constipation, pain near the anus)  TENDERNESS IN MOUTH AND THROAT WITH OR WITHOUT PRESENCE OF ULCERS (sore throat, sores in mouth, or a toothache) UNUSUAL RASH, SWELLING OR PAIN  UNUSUAL VAGINAL DISCHARGE OR ITCHING   Items with * indicate a potential emergency and should be followed up as soon as possible or go to the Emergency Department if any problems should occur.  Please show the CHEMOTHERAPY ALERT CARD or IMMUNOTHERAPY ALERT CARD at check-in to the Emergency Department and triage nurse.  Should you have questions after your visit or need to cancel or reschedule your appointment, please contact [facility] [phone]  and follow the prompts.  Office hours are 8:00 a.m. to 4:30 p.m. Monday - Friday. Please note that voicemails left after 4:00 p.m. may not be returned until the following business day.  We are closed weekends and major holidays. You have access to a nurse at all times for urgent questions. Please call the main number to the clinic [phone]  and follow the prompts.  For any non-urgent questions, you may also contact your provider using MyChart. We now offer e-Visits for anyone 62 and older to request care online for non-urgent symptoms. For details visit [email or web address].   Also download the MyChart app! Go to the app store, search "MyChart", open the app, select [facility], and log in with your MyChart username and password.

## 2022-10-05 NOTE — Progress Notes (Signed)
Patient presents today for chemotherapy infusion.  Patient is in satisfactory condition with no complaints voiced.  Vital signs are stable.  Labs reviewed by Dr. Delton Coombes during his office visit.  Bilirubin is 2.4 today.  MD aware.  All other labs are within treatment parameters.   We will proceed with treatment per MD orders.    Patient tolerated treatment well with no complaints voiced.  Patient left via wheelchair with wife in stable condition.  Vital signs stable at discharge.  Follow up as scheduled.

## 2022-10-07 ENCOUNTER — Encounter (HOSPITAL_COMMUNITY): Payer: Self-pay | Admitting: Physical Therapy

## 2022-10-07 ENCOUNTER — Ambulatory Visit (HOSPITAL_COMMUNITY): Payer: Medicare Other | Admitting: Physical Therapy

## 2022-10-07 DIAGNOSIS — S21101A Unspecified open wound of right front wall of thorax without penetration into thoracic cavity, initial encounter: Secondary | ICD-10-CM | POA: Diagnosis not present

## 2022-10-07 NOTE — Therapy (Signed)
OUTPATIENT PHYSICAL THERAPY wound treatment   Patient Name: Jonathan Keith MRN: [id number] DOB:1955/09/25, 67 y.o., male Today's Date: 10/07/2022   PCP: [facility] REFERRING PROVIDER: Ferd Hibbs  PHYSICAL THERAPY DISCHARGE SUMMARY  Visits from Start of Care: 3  Current functional level related to goals / functional outcomes: See below   Remaining deficits: See below   Education / Equipment: See below   Patient agrees to discharge. Patient goals were met. Patient is being discharged due to meeting the stated rehab goals. Patient's wife independent in dressing changes.   END OF SESSION:  PT End of Session - 10/07/22 1029     Visit Number 3    Number of Visits 3    Date for PT Re-Evaluation 10/08/22    Authorization Type medicare A    PT Start Time 1030    PT Stop Time 1052    PT Time Calculation (min) 22 min    Activity Tolerance Patient tolerated treatment well    Behavior During Therapy WFL for tasks assessed/performed             Past Medical History:  Diagnosis Date   Cancer ([place])    CHF (congestive heart failure) (HCC)    Chronic pain    neck, back, knees   Class 1 obesity due to excess calories with body mass index (BMI) of 31.0 to 31.9 in adult    Claudication [facility])    Coronary artery disease    DDD (degenerative disc disease), cervical    Diabetes mellitus ([place])    Dyslipidemia    Hypertension    Port-A-Cath in place 07/20/2021   Tobacco dependence    Past Surgical History:  Procedure Laterality Date   BILIARY BRUSHING N/A 02/25/2021   Procedure: BILIARY BRUSHING;  Surgeon: Rogene Houston, MD;  Location: [facility];  Service: Gastroenterology;  Laterality: N/A;   BILIARY STENT PLACEMENT N/A 02/25/2021   Procedure: BILIARY STENT PLACEMENT;  Surgeon: Rogene Houston, MD;  Location: [facility];  Service: Gastroenterology;  Laterality: N/A;   BILIARY STENT PLACEMENT  02/27/2021   Procedure: BILIARY STENT PLACEMENT (10FR x 9cm) IN THE RIGHT  SYSTEM;  Surgeon: Rogene Houston, MD;  Location: [facility];  Service: Gastroenterology;;   BREAST SURGERY Left    benign lump- in his 58s.   CARDIAC CATHETERIZATION     ERCP N/A 02/25/2021   Procedure: ENDOSCOPIC RETROGRADE CHOLANGIOPANCREATOGRAPHY (ERCP);  Surgeon: Rogene Houston, MD;  Location: [facility];  Service: Gastroenterology;  Laterality: N/A;   ERCP N/A 02/27/2021   Procedure: ENDOSCOPIC RETROGRADE CHOLANGIOPANCREATOGRAPHY (ERCP);  Surgeon: Rogene Houston, MD;  Location: [facility];  Service: Gastroenterology;  Laterality: N/A;   IR IMAGING GUIDED PORT INSERTION  07/21/2021   IR REMOVAL TUN ACCESS W/ PORT W/O FL MOD SED  09/30/2022   KNEE SURGERY Left    x2   LEFT HEART CATH AND CORONARY ANGIOGRAPHY N/A 09/08/2021   Procedure: LEFT HEART CATH AND CORONARY ANGIOGRAPHY;  Surgeon: Early Osmond, MD;  Location: [facility];  Service: Cardiovascular;  Laterality: N/A;   SPHINCTEROTOMY N/A 02/25/2021   Procedure: SPHINCTEROTOMY;  Surgeon: Rogene Houston, MD;  Location: [facility];  Service: Gastroenterology;  Laterality: N/A;   STENT REMOVAL  02/27/2021   Procedure: STENT REMOVAL (8.5Fr x 9cm);  Surgeon: Rogene Houston, MD;  Location: [facility];  Service: Gastroenterology;;   Patient Active Problem List   Diagnosis Date Noted   Ischemic cardiomyopathy 09/11/2022   AKI (acute kidney  injury) ([place]) 09/11/2022   Thrombus in heart chamber 06/09/2022   Encounter for therapeutic drug monitoring 06/09/2022   Lobar pneumonia ([place]) 11/28/2021   Acute on chronic systolic CHF (congestive heart failure) ([place]) 11/27/2021   Coronary artery disease 11/27/2021   Tobacco abuse 11/27/2021   Acute respiratory failure with hypoxia (HCC) 11/27/2021   Elevated troponin 11/27/2021   NSTEMI (non-ST elevated myocardial infarction) ([place]) 09/08/2021   ACS (acute coronary syndrome) ([place]) 09/08/2021   Nausea without vomiting 09/01/2021   Dehydration 07/23/2021   Port-A-Cath in place 07/20/2021    Atherosclerosis of native arteries of extremities with intermittent claudication, bilateral legs ([place]) 06/30/2021   Abnormal levels of other serum enzymes 06/30/2021   Nicotine dependence, cigarettes, uncomplicated 0000000   Biliary tract cancer ([place]) 06/12/2021   Hyperbilirubinemia    Sepsis ([place]) 05/25/2021   SIRS (systemic inflammatory response syndrome) ([place]) 04/28/2021   Cholangiocarcinoma ([place]) 04/10/2021   Calculus of bile duct without cholangitis or cholecystitis without obstruction 04/10/2021   Acute febrile illness 03/12/2021   Acute cholecystitis 03/12/2021   Diverticulitis 03/12/2021   Transaminitis 03/12/2021   Leukocytosis 03/12/2021   Hyponatremia 03/12/2021   Hyperglycemia due to diabetes mellitus ([place]) 03/12/2021   GERD (gastroesophageal reflux disease) 03/12/2021   Hypoalbuminemia due to protein-calorie malnutrition ([place]) 03/12/2021   Type 2 diabetes mellitus without complications ([place]) [id number]   Gastroesophageal reflux disease 03/12/2021   Elevated LFTs    Choledocholithiasis    Obstructive jaundice 02/24/2021   Class 1 obesity due to excess calories with body mass index (BMI) of 31.0 to 31.9 in adult 02/24/2021   Controlled diabetes mellitus type 2 with complications ([place]) 99991111   HLD (hyperlipidemia) 02/24/2021   Hypertension 02/24/2021   Tobacco dependence 02/24/2021   Elevated blood-pressure reading, without diagnosis of hypertension 02/24/2021    ONSET DATE: 09/30/2022  REFERRING DIAG: Per Dr. Greggory Keen, "since the port had eroded thru the skin, have to leave it open and packed to heal by secondary intention. will need wet to dry dressing changes starting tomorrow, daily for 7-10days."  THERAPY DIAG:  Rt chest wound   Rationale for Evaluation and Treatment: Rehabilitation    Wound Therapy - 10/07/22 0001     Subjective Pt states no pain. no dressing changes    Patient and Family Stated Goals wound to heal    Date of Onset 10/01/22     Prior Treatments irrigated after port dislodged at MD office yesterday.    Evaluation and Treatment Procedures Explained to Patient/Family Yes    Evaluation and Treatment Procedures agreed to    Wound Properties Date First Assessed: 10/01/22 Time First Assessed: 1440 Wound Type: Incision - Open Location: Chest Location Orientation: Right;Anterior;Upper Present on Admission: Yes   Dressing Type Gauze (Comment)    Dressing Status Old drainage    Dressing Change Frequency Daily    Site / Wound Assessment Clean;Pale    % Wound base Red or Granulating 100%    Drainage Amount Minimal    Drainage Description Serous    Treatment Cleansed;Irrigation   Wife mostly performs with PT instruction and intermittent demonstration   Wound Therapy - Clinical Statement Patient's wife performs most of treatment under PT instruction and intermittent demonstration for packing. Used sterile water to irrigate and to soak gauze for packing. dry 2x2 placed on top with medipore tape to secure. Gave patient some extra gauze along with remaining sterile water. Patient's wife is independent in changing dressings and patient does not require additional skilled PT care  at this time.    Wound Therapy - Functional Problem List bathing/ dressing    Factors Delaying/Impairing Wound Healing Diabetes Mellitus;Infection - systemic/local;Polypharmacy;Multiple medical problems    Hydrotherapy Plan Dressing change;Patient/family education;Other (comment)   may consider wound vac   Wound Therapy - Frequency Other (comment)    Wound Therapy - Current Recommendations --   MD requests daily, PT will educate wife on dressing changes   Wound Therapy - Follow Up Recommendations Home health RN    Dressing  sterile water impregnated 2x2 followed by dry 2x2 and medipore                PATIENT EDUCATION: Education details: keep dressing dry/how to pak wound  Person educated: Patient and Spouse Education method: Explanation Education  comprehension: needs further education     GOALS: Goals reviewed with patient? No  SHORT TERM GOALS: Target date: 10/05/2022  PT wife to be I in dressing changes to keep wound from becoming infected.  Baseline: Goal status: MET  ASSESSMENT:  CLINICAL IMPRESSION:  See above  OBJECTIVE IMPAIRMENTS:  skin integrity .   ACTIVITY LIMITATIONS: bathing and dressing   PERSONAL FACTORS: 1-2 comorbidities: DM, CA  are also affecting patient's functional outcome.   REHAB POTENTIAL: Good  CLINICAL DECISION MAKING: Stable/uncomplicated  EVALUATION COMPLEXITY: Low  PLAN: PT FREQUENCY: 2x/week  PT DURATION: 2 weeks  PLANNED INTERVENTIONS: Patient/Family education and Self Care  PLAN FOR NEXT SESSION: n/a   11:00 AM, 10/07/22 Mearl Latin PT, DPT Physical Therapist at [facility]

## 2022-10-08 ENCOUNTER — Inpatient Hospital Stay: Payer: Medicare Other

## 2022-10-08 VITALS — BP 96/61 | HR 89 | Temp 98.8°F | Resp 18

## 2022-10-08 DIAGNOSIS — Z5112 Encounter for antineoplastic immunotherapy: Secondary | ICD-10-CM | POA: Diagnosis not present

## 2022-10-08 DIAGNOSIS — Z452 Encounter for adjustment and management of vascular access device: Secondary | ICD-10-CM

## 2022-10-08 DIAGNOSIS — Z95828 Presence of other vascular implants and grafts: Secondary | ICD-10-CM

## 2022-10-08 MED ORDER — SODIUM CHLORIDE 0.9% FLUSH
10.0000 mL | Freq: Once | INTRAVENOUS | Status: AC
  Administered 2022-10-08: 10 mL via INTRAVENOUS

## 2022-10-08 MED ORDER — HEPARIN SOD (PORK) LOCK FLUSH 100 UNIT/ML IV SOLN
250.0000 [IU] | Freq: Once | INTRAVENOUS | Status: AC
  Administered 2022-10-08: 250 [IU] via INTRAVENOUS

## 2022-10-08 NOTE — Progress Notes (Signed)
PICC flushed with good blood return noted. Some small bruising, no swelling at site. Patient's wife reports low grade fever last night, cough and right sided chest pain, Dr. Delton Coombes made aware, advised patient to monitor symptoms.  patient discharged in satisfactory condition. VVS stable with no signs or symptoms of distressed noted.

## 2022-10-12 ENCOUNTER — Ambulatory Visit: Payer: Medicare Other | Admitting: Hematology

## 2022-10-12 ENCOUNTER — Inpatient Hospital Stay: Payer: Medicare Other | Attending: Hematology

## 2022-10-12 ENCOUNTER — Ambulatory Visit: Payer: Medicare Other

## 2022-10-12 ENCOUNTER — Other Ambulatory Visit: Payer: Medicare Other

## 2022-10-12 VITALS — BP 100/69 | HR 78 | Temp 97.4°F | Resp 18

## 2022-10-12 DIAGNOSIS — Z5986 Financial insecurity: Secondary | ICD-10-CM | POA: Diagnosis not present

## 2022-10-12 DIAGNOSIS — I2510 Atherosclerotic heart disease of native coronary artery without angina pectoris: Secondary | ICD-10-CM | POA: Insufficient documentation

## 2022-10-12 DIAGNOSIS — F17210 Nicotine dependence, cigarettes, uncomplicated: Secondary | ICD-10-CM | POA: Diagnosis not present

## 2022-10-12 DIAGNOSIS — R180 Malignant ascites: Secondary | ICD-10-CM | POA: Insufficient documentation

## 2022-10-12 DIAGNOSIS — Z8249 Family history of ischemic heart disease and other diseases of the circulatory system: Secondary | ICD-10-CM | POA: Diagnosis not present

## 2022-10-12 DIAGNOSIS — R140 Abdominal distension (gaseous): Secondary | ICD-10-CM | POA: Diagnosis not present

## 2022-10-12 DIAGNOSIS — Z79899 Other long term (current) drug therapy: Secondary | ICD-10-CM | POA: Insufficient documentation

## 2022-10-12 DIAGNOSIS — C221 Intrahepatic bile duct carcinoma: Secondary | ICD-10-CM | POA: Insufficient documentation

## 2022-10-12 DIAGNOSIS — Z452 Encounter for adjustment and management of vascular access device: Secondary | ICD-10-CM

## 2022-10-12 DIAGNOSIS — I959 Hypotension, unspecified: Secondary | ICD-10-CM | POA: Diagnosis not present

## 2022-10-12 DIAGNOSIS — C786 Secondary malignant neoplasm of retroperitoneum and peritoneum: Secondary | ICD-10-CM | POA: Insufficient documentation

## 2022-10-12 MED ORDER — HEPARIN SOD (PORK) LOCK FLUSH 100 UNIT/ML IV SOLN
500.0000 [IU] | Freq: Once | INTRAVENOUS | Status: AC
  Administered 2022-10-12: 500 [IU] via INTRAVENOUS

## 2022-10-12 MED ORDER — SODIUM CHLORIDE 0.9% FLUSH
10.0000 mL | INTRAVENOUS | Status: DC | PRN
  Administered 2022-10-12: 10 mL via INTRAVENOUS

## 2022-10-12 NOTE — Progress Notes (Signed)
Jonathan Keith presented for PICC line flush. PICC line located right arm . Good blood return present. PICC line flushed with 39m NS and 300U/342mHeparin. Procedure without incident. Patient tolerated procedure well.  Dressing changed , see flowsheet.   Vitals stable and discharged home from clinic via wheelchair. Follow up as scheduled.

## 2022-10-15 ENCOUNTER — Inpatient Hospital Stay: Payer: Medicare Other

## 2022-10-15 VITALS — BP 92/63 | HR 85 | Temp 98.1°F | Resp 18

## 2022-10-15 DIAGNOSIS — E860 Dehydration: Secondary | ICD-10-CM

## 2022-10-15 DIAGNOSIS — C249 Malignant neoplasm of biliary tract, unspecified: Secondary | ICD-10-CM

## 2022-10-15 DIAGNOSIS — C221 Intrahepatic bile duct carcinoma: Secondary | ICD-10-CM | POA: Diagnosis not present

## 2022-10-15 MED ORDER — HEPARIN SOD (PORK) LOCK FLUSH 100 UNIT/ML IV SOLN
250.0000 [IU] | Freq: Once | INTRAVENOUS | Status: AC | PRN
  Administered 2022-10-15: 250 [IU]

## 2022-10-15 MED ORDER — SODIUM CHLORIDE 0.9% FLUSH
10.0000 mL | Freq: Once | INTRAVENOUS | Status: AC | PRN
  Administered 2022-10-15: 10 mL

## 2022-10-19 ENCOUNTER — Inpatient Hospital Stay: Payer: Medicare Other

## 2022-10-19 ENCOUNTER — Ambulatory Visit (HOSPITAL_COMMUNITY)
Admission: RE | Admit: 2022-10-19 | Discharge: 2022-10-19 | Disposition: A | Discharge status: Home / Self Care | Payer: Medicare Other | Source: Ambulatory Visit | Attending: Hematology | Admitting: Hematology

## 2022-10-19 ENCOUNTER — Encounter (HOSPITAL_COMMUNITY): Payer: Self-pay

## 2022-10-19 ENCOUNTER — Other Ambulatory Visit: Payer: Self-pay | Admitting: Hematology

## 2022-10-19 ENCOUNTER — Inpatient Hospital Stay (HOSPITAL_BASED_OUTPATIENT_CLINIC_OR_DEPARTMENT_OTHER): Payer: Medicare Other | Admitting: Hematology

## 2022-10-19 DIAGNOSIS — C221 Intrahepatic bile duct carcinoma: Secondary | ICD-10-CM

## 2022-10-19 DIAGNOSIS — R180 Malignant ascites: Secondary | ICD-10-CM

## 2022-10-19 DIAGNOSIS — Z79899 Other long term (current) drug therapy: Secondary | ICD-10-CM

## 2022-10-19 DIAGNOSIS — R17 Unspecified jaundice: Secondary | ICD-10-CM

## 2022-10-19 DIAGNOSIS — K831 Obstruction of bile duct: Secondary | ICD-10-CM | POA: Diagnosis not present

## 2022-10-19 DIAGNOSIS — Z95828 Presence of other vascular implants and grafts: Secondary | ICD-10-CM

## 2022-10-19 LAB — CBC WITH DIFFERENTIAL/PLATELET
Abs Immature Granulocytes: 0.03 10*3/uL (ref 0.00–0.07)
Basophils Absolute: 0 10*3/uL (ref 0.0–0.1)
Basophils Relative: 0 %
Eosinophils Absolute: 0.1 10*3/uL (ref 0.0–0.5)
Eosinophils Relative: 2 %
HCT: 28.7 % — ABNORMAL LOW (ref 39.0–52.0)
Hemoglobin: 9.8 g/dL — ABNORMAL LOW (ref 13.0–17.0)
Immature Granulocytes: 1 %
Lymphocytes Relative: 10 %
Lymphs Abs: 0.7 10*3/uL (ref 0.7–4.0)
MCH: 32.6 pg (ref 26.0–34.0)
MCHC: 34.1 g/dL (ref 30.0–36.0)
MCV: 95.3 fL (ref 80.0–100.0)
Monocytes Absolute: 0.9 10*3/uL (ref 0.1–1.0)
Monocytes Relative: 15 %
Neutro Abs: 4.6 10*3/uL (ref 1.7–7.7)
Neutrophils Relative %: 72 %
Platelets: 101 10*3/uL — ABNORMAL LOW (ref 150–400)
RBC: 3.01 MIL/uL — ABNORMAL LOW (ref 4.22–5.81)
RDW: 14.2 % (ref 11.5–15.5)
WBC: 6.3 10*3/uL (ref 4.0–10.5)
nRBC: 0 % (ref 0.0–0.2)

## 2022-10-19 LAB — BILIRUBIN, DIRECT: Bilirubin, Direct: 2.5 mg/dL — ABNORMAL HIGH (ref 0.0–0.2)

## 2022-10-19 LAB — COMPREHENSIVE METABOLIC PANEL
ALT: 101 U/L — ABNORMAL HIGH (ref 0–44)
AST: 129 U/L — ABNORMAL HIGH (ref 15–41)
Albumin: 2.4 g/dL — ABNORMAL LOW (ref 3.5–5.0)
Alkaline Phosphatase: 318 U/L — ABNORMAL HIGH (ref 38–126)
Anion gap: 7 (ref 5–15)
BUN: 21 mg/dL (ref 8–23)
CO2: 23 mmol/L (ref 22–32)
Calcium: 7.9 mg/dL — ABNORMAL LOW (ref 8.9–10.3)
Chloride: 97 mmol/L — ABNORMAL LOW (ref 98–111)
Creatinine, Ser: 1.33 mg/dL — ABNORMAL HIGH (ref 0.61–1.24)
GFR, Estimated: 59 mL/min — ABNORMAL LOW (ref >60)
Glucose, Bld: 143 mg/dL — ABNORMAL HIGH (ref 70–99)
Potassium: 4 mmol/L (ref 3.5–5.1)
Sodium: 127 mmol/L — ABNORMAL LOW (ref 135–145)
Total Bilirubin: 4.3 mg/dL — ABNORMAL HIGH (ref 0.3–1.2)
Total Protein: 6.1 g/dL — ABNORMAL LOW (ref 6.5–8.1)

## 2022-10-19 LAB — MAGNESIUM: Magnesium: 1.9 mg/dL (ref 1.7–2.4)

## 2022-10-19 LAB — TSH: TSH: 1.126 u[IU]/mL (ref 0.350–4.500)

## 2022-10-19 MED ORDER — ALBUMIN HUMAN 25 % IV SOLN: 50.0000 g | Freq: Once | INTRAVENOUS | Status: AC

## 2022-10-19 MED ORDER — GADOBUTROL 1 MMOL/ML IV SOLN
7.0000 mL | Freq: Once | INTRAVENOUS | Status: AC | PRN
  Administered 2022-10-19: 7 mL via INTRAVENOUS

## 2022-10-19 MED ORDER — SODIUM CHLORIDE 0.9% FLUSH: 10.0000 mL | INTRAVENOUS | Status: DC | PRN

## 2022-10-19 MED ORDER — ALBUMIN HUMAN 25 % IV SOLN
INTRAVENOUS | Status: AC
  Administered 2022-10-19: 50 g via INTRAVENOUS
  Filled 2022-10-19: qty 200

## 2022-10-19 NOTE — Progress Notes (Signed)
Patient tolerated right sided paracentesis procedure and 50G of IV albumin well today and 3.5 Liters of cloudy yellow ascites removed. Patient verbalized understanding of post procedure instructions, peripheral IV kept in place for MRI after paracentesis today. Patient returned back to radiology waiting room at this time and waiting on MRI next with no acute distress noted.

## 2022-10-19 NOTE — Progress Notes (Signed)
Patient presents today for Gemzar/Cisplatin and follow up visit with Dr. Delton Coombes. AST129, ALT 101, Bilirubin 4.3, Albumin 2.4. Vital signs within parameters for treatment.   Message received from A.Anderson RN / Dr. Delton Coombes No tx today d/t elevated bilirubin. Tx on hold until after MRI liver MRCP (which will be scheduled today).

## 2022-10-19 NOTE — Progress Notes (Signed)
Patients PICC flushed without difficulty.  Good blood return noted with no bruising or swelling noted at site.  PICC dressing and caps changed.  Safety lock in place.

## 2022-10-19 NOTE — Procedures (Signed)
PreOperative Dx: Cholangiocarcinoma, Malignant ascites Postoperative Dx: Cholangiocarcinoma, Malignant ascites Procedure:   US guided paracentesis Radiologist:  Thornton Papas Anesthesia:  10 ml of1% lidocaine Specimen:  3.5 L of yellow ascitic fluid EBL:   < 1 ml Complications:  None

## 2022-10-19 NOTE — Patient Instructions (Addendum)
[place] at [facility] Discharge Instructions   You were seen and examined today by Dr. Delton Coombes.  He reviewed the results of your lab work which are normal/stable.   We will hold your treatment today due to your bilirubin being elevated. We will also arrange for you to have the fluid removed from your belly and an MRI of your liver.   Return as scheduled.    Thank you for choosing [place] at [facility] to provide your oncology and hematology care.  To afford each patient quality time with our provider, please arrive at least 15 minutes before your scheduled appointment time.   If you have a lab appointment with the [facility] please come in thru the Main Entrance and check in at the main information desk.  You need to re-schedule your appointment should you arrive 10 or more minutes late.  We strive to give you quality time with our providers, and arriving late affects you and other patients whose appointments are after yours.  Also, if you no show three or more times for appointments you may be dismissed from the clinic at the providers discretion.     Again, thank you for choosing [facility].  Our hope is that these requests will decrease the amount of time that you wait before being seen by our physicians.       _____________________________________________________________  Should you have questions after your visit to [facility], please contact our office at [phone] and follow the prompts.  Our office hours are 8:00 a.m. and 4:30 p.m. Monday - Friday.  Please note that voicemails left after 4:00 p.m. may not be returned until the following business day.  We are closed weekends and major holidays.  You do have access to a nurse 24-7, just call the main number to the clinic [phone] and do not press any options, hold on the line and a nurse will answer the phone.    For prescription refill  requests, have your pharmacy contact our office and allow 72 hours.    Due to Covid, you will need to wear a mask upon entering the hospital. If you do not have a mask, a mask will be given to you at the Main Entrance upon arrival. For doctor visits, patients may have 1 support person age 43 or older with them. For treatment visits, patients can not have anyone with them due to social distancing guidelines and our immunocompromised population.

## 2022-10-19 NOTE — Progress Notes (Signed)
[facility] [address], [place] [address]    Clinic Day:  10/19/2022  Referring physician: Redmond School, MD  Patient Care Team: Redmond School, MD as PCP - General (Internal Medicine) Chalmers Guest, MD as PCP - Cardiology (Cardiology) Derek Jack, MD as Medical Oncologist (Medical Oncology)   ASSESSMENT & PLAN:   Assessment: Metastatic cholangiocarcinoma: - 02/2021 initial diagnosis presentation with painless jaundice - ERCP with sphincterotomy, subsequent ERCP with plastic biliary stent placement. - MRI-2 cm mass involving the common hepatic duct with upstream biliary dilatation. - 03/2021 consult at [facility] with GI and surgery-confirm diagnosis of perihilar cholangiocarcinoma - 03/21/2021-biopsy/pathology, ERCP-common hepatic duct cytology and brushings performed.  EUS-1 enlarged lymph node versus tumor implant in the gastrohepatic ligament-negative for malignancy.  Single biliary plastic stent removed and replaced with the plastic stent in the right and left ducts. - 04/17/2021-right portal vein embolization to promote left hepatic hypertrophy. - 07/02/2021-diagnostic laparoscopy with multiple areas concerning for metastatic blocks identified in the abdomen and peritoneal surfaces.  6 separate biopsies obtained. - Pathology consistent with metastatic carcinoma. - 37 pound weight loss in the last 5 months. - Cycle 1 of gemcitabine, cisplatin, durvalumab on 07/22/2021.  2.  Social history/family: - Lives at home with his wife.  He worked as a Development worker, community. - Current active smoker, 1 pack/day since age 10. - Maternal grandmother had cancer.  Maternal uncle had brain tumor.    Plan: Metastatic cholangiocarcinoma: - Chemotherapy with dose reduced gemcitabine and cisplatin started back on 10/05/2022 as his recent scan showed progression. - Labs today showed bilirubin increased to 4.3, direct bilirubin 2.5.  LFTs also increased with  AST 129 and ALT 101. - He had previous plastic stent across the right hepatic system placed in 2022. - I will reach out to our GI colleagues Dr. Rush Landmark to see if stent needs to be changed. - Will also order MRI/MRCP as soon as possible. - Will hold his treatment today. - I would also recommend biopsy of liver lesion for NGS testing as cholangiocarcinoma is highly likely to harbor targetable mutations.  We have tried NGS testing on previous biopsy, tissue sample was not adequate.   2.  Abdominal distention: - He has developed ascites from peritoneal carcinomatosis. - We will arrange for therapeutic paracentesis.  Albumin is 2.4.  I have recommended 50 g of albumin at the time of paracentesis. - Continue Lasix 20 mg daily.  3.  Left ventricular thrombus (Dx 06/04/2022):: - Coumadin discontinued by cardiology on 09/14/2022 as repeat echo did not show LV thrombus.  Continue aspirin 81 mg daily.  4.  Hypotension: - He is off of midodrine.  Orders Placed This Encounter  Procedures   CT Biopsy    Standing Status:   Future    Standing Expiration Date:   10/19/2023    Order Specific Question:   Lab orders requested (DO NOT place separate lab orders, these will be automatically ordered during procedure specimen collection):    Answer:   Surgical Pathology    Order Specific Question:   Reason for Exam (SYMPTOM  OR DIAGNOSIS REQUIRED)    Answer:   bx liver lesion - need tissue for NGS testing    Order Specific Question:   Preferred location?    Answer:   [facility]   MR ABDOMEN MRCP W WO CONTAST    Standing Status:   Future    Number of Occurrences:   1  Standing Expiration Date:   10/19/2023    Order Specific Question:   If indicated for the ordered procedure, I authorize the administration of contrast media per Radiology protocol    Answer:   Yes    Order Specific Question:   What is the patient's sedation requirement?    Answer:   No Sedation    Order Specific Question:   Does  the patient have a pacemaker or implanted devices?    Answer:   No    Order Specific Question:   Preferred imaging location?    Answer:   [facility] (table limit [id number])   US Paracentesis    Standing Status:   Future    Number of Occurrences:   1    Standing Expiration Date:   10/19/2023    Order Specific Question:   If therapeutic, is there a maximum amount of fluid to be removed?    Answer:   Yes    Order Specific Question:   What is the maximum amount of fluide to be removed?    Answer:   5 L    Order Specific Question:   Are labs required for specimen collection?    Answer:   No    Order Specific Question:   Is Albumin medication needed?    Answer:   Yes    Order Specific Question:   A seperate Albumin medication order is required.    Answer:   I understand I need to place a separate order for albumin    Order Specific Question:   Reason for Exam (SYMPTOM  OR DIAGNOSIS REQUIRED)    Answer:   maliganat ascites    Order Specific Question:   Preferred imaging location?    Answer:   [facility]   Bilirubin, direct    I,Alexis Herring,acting as a scribe for Derek Jack, MD.,have documented all relevant documentation on the behalf of Derek Jack, MD,as directed by  Derek Jack, MD while in the presence of Derek Jack, MD.  I, Derek Jack MD, have reviewed the above documentation for accuracy and completeness, and I agree with the above.    Derek Jack, MD   3/11/20245:38 PM  CHIEF COMPLAINT:   Diagnosis: intrahepatic cholangiocarcinoma    Cancer Staging  Cholangiocarcinoma [facility]) Staging form: Perihilar Bile Ducts, AJCC 8th Edition - Clinical stage from 07/17/2021: Stage IVB (cTX, cNX, pM1) - Signed by Derek Jack, MD on 07/17/2021    Prior Therapy: None  Current Therapy:  Cisplatin + Gemcitabine D1, D15 q 28d    HISTORY OF PRESENT ILLNESS:   Oncology History  Cholangiocarcinoma ([place])  02/28/2021  Initial Diagnosis   Cholangiocarcinoma ([place])   07/17/2021 Cancer Staging   Staging form: Perihilar Bile Ducts, AJCC 8th Edition - Clinical stage from 07/17/2021: Stage IVB (cTX, cNX, pM1) - Signed by Derek Jack, MD on 07/17/2021 Stage prefix: Initial diagnosis   07/22/2021 - 04/01/2022 Chemotherapy   Patient is on Treatment Plan : BILIARY TRACT Cisplatin + Gemcitabine D1,15 + Imfinzi day 1 q28d     07/22/2021 -  Chemotherapy   Patient is on Treatment Plan : BILIARY TRACT IMFINZI D1  +  Cisplatin + Gemcitabine  D1, 15 q28d        INTERVAL HISTORY:   Jonathan Keith seen for follow-up of cholangiocarcinoma and toxicity management prior to chemotherapy. He was last seen by me on 10/05/22.  Today, he states that he is doing well overall. His appetite  level is at 40%. His energy level is at 0%.  He was started back on chemotherapy and has noticed more fatigue.  He also reported abdominal distention which started since Friday.  He has taken Lasix for the last 3 days.  PAST MEDICAL HISTORY:   Past Medical History: Past Medical History:  Diagnosis Date   Cancer ([place])    CHF (congestive heart failure) (HCC)    Chronic pain    neck, back, knees   Class 1 obesity due to excess calories with body mass index (BMI) of 31.0 to 31.9 in adult    Claudication [facility])    Coronary artery disease    DDD (degenerative disc disease), cervical    Diabetes mellitus ([place])    Dyslipidemia    Hypertension    Port-A-Cath in place 07/20/2021   Tobacco dependence     Surgical History: Past Surgical History:  Procedure Laterality Date   BILIARY BRUSHING N/A 02/25/2021   Procedure: BILIARY BRUSHING;  Surgeon: Rogene Houston, MD;  Location: [facility];  Service: Gastroenterology;  Laterality: N/A;   BILIARY STENT PLACEMENT N/A 02/25/2021   Procedure: BILIARY STENT PLACEMENT;  Surgeon: Rogene Houston, MD;  Location: [facility];  Service: Gastroenterology;  Laterality: N/A;   BILIARY STENT  PLACEMENT  02/27/2021   Procedure: BILIARY STENT PLACEMENT (10FR x 9cm) IN THE RIGHT SYSTEM;  Surgeon: Rogene Houston, MD;  Location: [facility];  Service: Gastroenterology;;   BREAST SURGERY Left    benign lump- in his 11s.   CARDIAC CATHETERIZATION     ERCP N/A 02/25/2021   Procedure: ENDOSCOPIC RETROGRADE CHOLANGIOPANCREATOGRAPHY (ERCP);  Surgeon: Rogene Houston, MD;  Location: [facility];  Service: Gastroenterology;  Laterality: N/A;   ERCP N/A 02/27/2021   Procedure: ENDOSCOPIC RETROGRADE CHOLANGIOPANCREATOGRAPHY (ERCP);  Surgeon: Rogene Houston, MD;  Location: [facility];  Service: Gastroenterology;  Laterality: N/A;   IR IMAGING GUIDED PORT INSERTION  07/21/2021   IR REMOVAL TUN ACCESS W/ PORT W/O FL MOD SED  09/30/2022   KNEE SURGERY Left    x2   LEFT HEART CATH AND CORONARY ANGIOGRAPHY N/A 09/08/2021   Procedure: LEFT HEART CATH AND CORONARY ANGIOGRAPHY;  Surgeon: Early Osmond, MD;  Location: [facility];  Service: Cardiovascular;  Laterality: N/A;   SPHINCTEROTOMY N/A 02/25/2021   Procedure: SPHINCTEROTOMY;  Surgeon: Rogene Houston, MD;  Location: [facility];  Service: Gastroenterology;  Laterality: N/A;   STENT REMOVAL  02/27/2021   Procedure: STENT REMOVAL (8.5Fr x 9cm);  Surgeon: Rogene Houston, MD;  Location: [facility];  Service: Gastroenterology;;    Social History: Social History   Socioeconomic History   Marital status: Married    Spouse name: Not on file   Number of children: Not on file   Years of education: Not on file   Highest education level: Not on file  Occupational History   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  Tobacco Use   Smoking status: Every Day    Packs/day: 0.50    Years: 56.00    Total pack years: 28.00    Types: Cigarettes   Smokeless tobacco: Never  Vaping Use   Vaping Use: Never used  Substance and Sexual Activity   Alcohol use: Not Currently    Comment: stopped 2.5 years ago 03/11/21   Drug use: Never   Sexual activity: Not on file   Other Topics Concern   Not on file  Social History Narrative   Not on file   Social Determinants  of Health   Financial Resource Strain: Medium Risk (03/07/2021)   Overall Financial Resource Strain (CARDIA)    Difficulty of Paying Living Expenses: Somewhat hard  Food Insecurity: No Food Insecurity (03/07/2021)   Hunger Vital Sign    Worried About Running Out of Food in the Last Year: Never true    Ran Out of Food in the Last Year: Never true  Transportation Needs: No Transportation Needs (03/07/2021)   PRAPARE - Hydrologist (Medical): No    Lack of Transportation (Non-Medical): No  Physical Activity: Insufficiently Active (03/07/2021)   Exercise Vital Sign    Days of Exercise per Week: 2 days    Minutes of Exercise per Session: 20 min  Stress: No Stress Concern Present (03/07/2021)   [place]    Feeling of Stress : Only a little  Social Connections: Moderately Integrated (03/07/2021)   Social Connection and Isolation Panel [NHANES]    Frequency of Communication with Friends and Family: More than three times a week    Frequency of Social Gatherings with Friends and Family: More than three times a week    Attends Religious Services: More than 4 times per year    Active Member of [employer] or Organizations: No    Attends Archivist Meetings: Never    Marital Status: Married  Human resources officer Violence: Not At Risk (03/07/2021)   Humiliation, Afraid, Rape, and Kick questionnaire    Fear of Current or Ex-Partner: No    Emotionally Abused: No    Physically Abused: No    Sexually Abused: No    Family History: Family History  Problem Relation Age of Onset   CAD Mother    Cancer Neg Hx     Current Medications:  Current Outpatient Medications:    acetaminophen (TYLENOL) 500 MG tablet, Take 500 mg by mouth every 6 (six) hours as needed for moderate pain or headache., Disp: , Rfl:     albuterol (VENTOLIN HFA) 108 (90 Base) MCG/ACT inhaler, Inhale 2 puffs into the lungs every 6 (six) hours as needed for wheezing or shortness of breath., Disp: 6.7 g, Rfl: 1   alfuzosin (UROXATRAL) 10 MG 24 hr tablet, Take 1 tablet (10 mg total) by mouth at bedtime., Disp: 30 tablet, Rfl: 11   atorvastatin (LIPITOR) 40 MG tablet, Take 1 tablet (40 mg total) by mouth daily at 6 PM., Disp: 90 tablet, Rfl: 3   carvedilol (COREG) 6.25 MG tablet, Take 1 tablet (6.25 mg total) by mouth 2 (two) times daily., Disp: 180 tablet, Rfl: 3   CISPLATIN IV, Inject into the vein once a week. Days 1, 8 every 21 days, Disp: , Rfl:    Durvalumab (IMFINZI IV), Inject into the vein every 21 ( twenty-one) days., Disp: , Rfl:    famotidine (PEPCID) 20 MG tablet, Take 20 mg by mouth 2 (two) times daily., Disp: , Rfl:    ferrous sulfate (FERROUSUL) 325 (65 FE) MG tablet, Take 1 tablet (325 mg total) by mouth daily with breakfast., Disp: , Rfl:    Gemcitabine HCl (GEMZAR IV), Inject into the vein once a week. Days 1, 8 every 21 days, Disp: , Rfl:    Ginseng 100 MG CAPS, Take 1 capsule by mouth daily., Disp: , Rfl:    lidocaine-prilocaine (EMLA) cream, , Disp: , Rfl:    lisinopril (ZESTRIL) 2.5 MG tablet, Take 1 tablet (2.5 mg total) by mouth daily., Disp: 90 tablet,  Rfl: 3   loratadine (CLARITIN) 10 MG tablet, Take 10 mg by mouth daily. Takes for a few days after shot to boost WBC. About every 2nd treatment, Disp: , Rfl:    pantoprazole (PROTONIX) 40 MG tablet, Take 1 tablet (40 mg total) by mouth daily., Disp: 90 tablet, Rfl: 3   prochlorperazine (COMPAZINE) 10 MG tablet, TAKE (1) TABLET BY MOUTH EVERY (6) HOURS AS NEEDED., Disp: 90 tablet, Rfl: 0   tamsulosin (FLOMAX) 0.4 MG CAPS capsule, TAKE (2) CAPSULES BY MOUTH AT BEDTIME., Disp: 60 capsule, Rfl: 6   Vitamin D, Ergocalciferol, (DRISDOL) 1.25 MG (50000 UNIT) CAPS capsule, Take 1 capsule by mouth once a week., Disp: , Rfl:    Wound Dressings (INTRASITE GEL APPLIPAK) GEL,  Apply 1 Units topically daily., Disp: 30 g, Rfl: 0   metFORMIN (GLUCOPHAGE) 500 MG tablet, Take 1 tablet (500 mg total) by mouth 2 (two) times daily with a meal., Disp: 60 tablet, Rfl: 0   nitroGLYCERIN (NITROSTAT) 0.4 MG SL tablet, Place 1 tablet (0.4 mg total) under the tongue every 5 (five) minutes as needed for chest pain., Disp: 25 tablet, Rfl: 3   Allergies: No Known Allergies  REVIEW OF SYSTEMS:   Review of Systems  Constitutional:  Negative for chills, fatigue and fever.  HENT:   Negative for lump/mass, mouth sores, nosebleeds, sore throat and trouble swallowing.   Eyes:  Negative for eye problems.  Respiratory:  Positive for cough. Negative for shortness of breath.   Cardiovascular:  Negative for chest pain, leg swelling and palpitations.  Gastrointestinal:  Positive for abdominal distention, nausea and vomiting. Negative for abdominal pain, constipation and diarrhea.  Genitourinary:  Negative for bladder incontinence, difficulty urinating, dysuria, frequency, hematuria and nocturia.   Musculoskeletal:  Negative for arthralgias, back pain, flank pain, myalgias and neck pain.  Skin:  Negative for itching and rash.  Neurological:  Negative for dizziness, headaches and numbness.  Hematological:  Does not bruise/bleed easily.  Psychiatric/Behavioral:  Negative for depression, sleep disturbance and suicidal ideas. The patient is not nervous/anxious.   All other systems reviewed and are negative.    VITALS:   There were no vitals taken for this visit.  Wt Readings from Last 3 Encounters:  10/19/22 161 lb 4.8 oz (73.2 kg)  10/05/22 160 lb 1.6 oz (72.6 kg)  09/30/22 157 lb (71.2 kg)    There is no height or weight on file to calculate BMI.  Performance status (ECOG): 1 - Symptomatic but completely ambulatory  PHYSICAL EXAM:   Physical Exam Vitals and nursing note reviewed. Exam conducted with a chaperone present.  Constitutional:      Appearance: Normal appearance.   Cardiovascular:     Rate and Rhythm: Normal rate and regular rhythm.     Pulses: Normal pulses.     Heart sounds: Normal heart sounds.  Pulmonary:     Effort: Pulmonary effort is normal.     Breath sounds: Normal breath sounds.  Abdominal:     General: There is distension.     Palpations: Abdomen is soft. There is no hepatomegaly, splenomegaly or mass.     Tenderness: There is no abdominal tenderness.  Musculoskeletal:     Right lower leg: No edema.     Left lower leg: No edema.  Lymphadenopathy:     Cervical: No cervical adenopathy.     Right cervical: No superficial, deep or posterior cervical adenopathy.    Left cervical: No superficial, deep or posterior cervical adenopathy.  Upper Body:     Right upper body: No supraclavicular or axillary adenopathy.     Left upper body: No supraclavicular or axillary adenopathy.  Neurological:     General: No focal deficit present.     Mental Status: He is alert and oriented to person, place, and time.  Psychiatric:        Mood and Affect: Mood normal.        Behavior: Behavior normal.     LABS:      Latest Ref Rng & Units 10/19/2022    8:11 AM 10/05/2022    7:49 AM 09/28/2022   10:04 AM  CBC  WBC 4.0 - 10.5 K/uL 6.3  6.1  5.9   Hemoglobin 13.0 - 17.0 g/dL 9.8  10.7  11.4   Hematocrit 39.0 - 52.0 % 28.7  31.8  33.2   Platelets 150 - 400 K/uL 101  105  85       Latest Ref Rng & Units 10/19/2022    8:11 AM 10/05/2022    7:49 AM 09/28/2022   10:04 AM  CMP  Glucose 70 - 99 mg/dL 143  163  149   BUN 8 - 23 mg/dL '21  21  18   '$ Creatinine 0.61 - 1.24 mg/dL 1.33  1.44  1.39   Sodium 135 - 145 mmol/L 127  130  128   Potassium 3.5 - 5.1 mmol/L 4.0  4.6  4.4   Chloride 98 - 111 mmol/L 97  99  98   CO2 22 - 32 mmol/L '23  21  21   '$ Calcium 8.9 - 10.3 mg/dL 7.9  8.5  8.5   Total Protein 6.5 - 8.1 g/dL 6.1  6.5  6.6   Total Bilirubin 0.3 - 1.2 mg/dL 4.3  2.4  3.5   Alkaline Phos 38 - 126 U/L 318  297  268   AST 15 - 41 U/L 129  75  91    ALT 0 - 44 U/L 101  53  69      Lab Results  Component Value Date   CEA1 3.9 07/17/2021   /  CEA  Date Value Ref Range Status  07/17/2021 3.9 0.0 - 4.7 ng/mL Final    Comment:    (NOTE)                             Nonsmokers          <3.9                             Smokers             <5.6 Roche [employer]ostics Electrochemiluminescence Immunoassay (ECLIA) Values obtained with different assay methods or kits cannot be used interchangeably.  Results cannot be interpreted as absolute evidence of the presence or absence of malignant disease. Performed At: [facility] [place], [place] [id number] Rush Farmer MD [id number]    No results found for: "PSA1" Lab Results  Component Value Date   [id number] (H) 09/28/2022   No results found for: "CAN125"  No results found for: "TOTALPROTELP", "ALBUMINELP", "A1GS", "A2GS", "BETS", "BETA2SER", "GAMS", "MSPIKE", "SPEI" Lab Results  Component Value Date   TIBC 139 (L) 09/12/2021   TIBC 154 (L) 03/13/2021   FERRITIN 1,132 (H) 09/12/2021   FERRITIN 708 (H) 03/13/2021   IRONPCTSAT 16 (L) 09/12/2021   IRONPCTSAT 17 (  L) 03/13/2021   No results found for: "LDH"   STUDIES:   US Paracentesis  Result Date: 10/19/2022 Lavonia Dana, MD     10/19/2022 11:41 AM PreOperative Dx: Cholangiocarcinoma, Malignant ascites Postoperative Dx: Cholangiocarcinoma, Malignant ascites Procedure:   US guided paracentesis Radiologist:  Thornton Papas Anesthesia:  10 ml of1% lidocaine Specimen:  3.5 L of yellow ascitic fluid EBL:   < 1 ml Complications:  None  IR PICC PLACEMENT RIGHT >5 YRS INC IMG GUIDE  Result Date: 09/30/2022 INDICATION: Cholangiocarcinoma, receiving chemotherapy, exposed eroded port catheter had to be removed. PICC line placed for continued chemotherapy. EXAM: ULTRASOUND AND FLUOROSCOPIC GUIDED PICC LINE INSERTION MEDICATIONS: 1% lidocaine local CONTRAST:  None FLUOROSCOPY TIME:  Six seconds (1 mGy) COMPLICATIONS: None  immediate. TECHNIQUE: The procedure, risks, benefits, and alternatives were explained to the patient and informed written consent was obtained. A timeout was performed prior to the initiation of the procedure. The right upper extremity was prepped with chlorhexidine in a sterile fashion, and a sterile drape was applied covering the operative field. Maximum barrier sterile technique with sterile gowns and gloves were used for the procedure. A timeout was performed prior to the initiation of the procedure. Local anesthesia was provided with 1% lidocaine. Under direct ultrasound guidance, the right basilic vein was accessed with a micropuncture kit after the overlying soft tissues were anesthetized with 1% lidocaine. An ultrasound image was saved for documentation purposes. A guidewire was advanced to the level of the superior caval-atrial junction for measurement purposes and the PICC line was cut to length. A peel-away sheath was placed and a 39 cm, 5 [place], single lumen was inserted to level of the superior caval-atrial junction. A post procedure spot fluoroscopic was obtained. The catheter easily aspirated and flushed and was sutured in place. A dressing was placed. The patient tolerated the procedure well without immediate post procedural complication. FINDINGS: After catheter placement, the tip lies within the superior cavoatrial junction. The catheter aspirates and flushes normally and is ready for immediate use. IMPRESSION: Successful ultrasound and fluoroscopic guided placement of a right basilic vein approach, 39 cm, 5 French, single lumen PICC with tip at the superior caval-atrial junction. The PICC line is ready for immediate use. Electronically Signed   By: Jerilynn Mages.  Shick M.D.   On: 09/30/2022 15:23   IR REMOVAL TUN ACCESS W/ PORT W/O FL MOD SED  Result Date: 09/30/2022 CLINICAL DATA:  Eroded exposed port site at the skin EXAM: REMOVAL OF IMPLANTED TUNNELED PORT-A-CATH MEDICATIONS: 1% lidocaine local with  epinephrine. ANESTHESIA/SEDATION: Moderate (conscious) sedation was employed during this procedure. A total of Versed 1.5 mg and Fentanyl 75 mcg was administered intravenously. Moderate Sedation Time: 17 minutes. The patient's level of consciousness and vital signs were monitored continuously by radiology nursing throughout the procedure under my direct supervision. FLUOROSCOPY TIME:  None PROCEDURE: Informed written consent was obtained from the patient after a discussion of the risk, benefits and alternatives to the procedure. The patient was positioned supine on the fluoroscopy table and the right chest Port-A-Cath site was prepped with chlorhexidine. A sterile gown and gloves were worn during the procedure. Local anesthesia was provided with 1% lidocaine with epinephrine. A timeout was performed prior to the initiation of the procedure. An incision was made overlying the eroded exposed Port-A-Cath with a #15 scalpel. Utilizing sharp and blunt dissection, the Port-A-Cath was removed completely. The pocked was irrigated with sterile saline. Hemostasis obtained. Wound was left open and packed with iodoform gauze to heal  by secondary intention. The patient tolerated the procedure well without immediate post procedural complication. FINDINGS: Successful removal of implant Port-A-Cath without immediate post procedural complication. IMPRESSION: Successful removal of implanted Port-A-Cath. Wound was left open and packed with iodoform gauze to heal by secondary intention. Electronically Signed   By: Jerilynn Mages.  Shick M.D.   On: 09/30/2022 15:22

## 2022-10-20 ENCOUNTER — Other Ambulatory Visit: Payer: Self-pay | Admitting: *Deleted

## 2022-10-20 DIAGNOSIS — Z452 Encounter for adjustment and management of vascular access device: Secondary | ICD-10-CM

## 2022-10-20 MED ORDER — SODIUM CHLORIDE 0.9% FLUSH: 10.0000 mL | INTRAVENOUS | 3 refills | Status: DC

## 2022-10-20 MED ORDER — MISC. DEVICES KIT: 1.0000 | PACK | 0 refills | Status: DC

## 2022-10-21 ENCOUNTER — Inpatient Hospital Stay (HOSPITAL_COMMUNITY)
Admission: EM | Admit: 2022-10-21 | Discharge: 2022-10-25 | DRG: 445 | Disposition: A | Discharge status: Home Health | Payer: Medicare Other | Attending: Family Medicine | Admitting: Family Medicine

## 2022-10-21 ENCOUNTER — Other Ambulatory Visit: Payer: Self-pay

## 2022-10-21 ENCOUNTER — Encounter (HOSPITAL_COMMUNITY): Payer: Self-pay

## 2022-10-21 DIAGNOSIS — R180 Malignant ascites: Secondary | ICD-10-CM | POA: Diagnosis present

## 2022-10-21 DIAGNOSIS — K831 Obstruction of bile duct: Principal | ICD-10-CM | POA: Diagnosis present

## 2022-10-21 DIAGNOSIS — E871 Hypo-osmolality and hyponatremia: Secondary | ICD-10-CM | POA: Diagnosis present

## 2022-10-21 DIAGNOSIS — N1831 Chronic kidney disease, stage 3a: Secondary | ICD-10-CM | POA: Diagnosis present

## 2022-10-21 DIAGNOSIS — D630 Anemia in neoplastic disease: Secondary | ICD-10-CM | POA: Diagnosis present

## 2022-10-21 DIAGNOSIS — E118 Type 2 diabetes mellitus with unspecified complications: Secondary | ICD-10-CM | POA: Diagnosis present

## 2022-10-21 DIAGNOSIS — I2510 Atherosclerotic heart disease of native coronary artery without angina pectoris: Secondary | ICD-10-CM | POA: Diagnosis present

## 2022-10-21 DIAGNOSIS — I129 Hypertensive chronic kidney disease with stage 1 through stage 4 chronic kidney disease, or unspecified chronic kidney disease: Secondary | ICD-10-CM | POA: Diagnosis not present

## 2022-10-21 DIAGNOSIS — I700 Atherosclerosis of aorta: Secondary | ICD-10-CM | POA: Diagnosis present

## 2022-10-21 DIAGNOSIS — C799 Secondary malignant neoplasm of unspecified site: Secondary | ICD-10-CM | POA: Diagnosis present

## 2022-10-21 DIAGNOSIS — Z5986 Financial insecurity: Secondary | ICD-10-CM | POA: Diagnosis not present

## 2022-10-21 DIAGNOSIS — Z79899 Other long term (current) drug therapy: Secondary | ICD-10-CM | POA: Diagnosis not present

## 2022-10-21 DIAGNOSIS — E1151 Type 2 diabetes mellitus with diabetic peripheral angiopathy without gangrene: Secondary | ICD-10-CM | POA: Diagnosis present

## 2022-10-21 DIAGNOSIS — D638 Anemia in other chronic diseases classified elsewhere: Secondary | ICD-10-CM

## 2022-10-21 DIAGNOSIS — C221 Intrahepatic bile duct carcinoma: Secondary | ICD-10-CM | POA: Diagnosis present

## 2022-10-21 DIAGNOSIS — R7989 Other specified abnormal findings of blood chemistry: Secondary | ICD-10-CM | POA: Diagnosis present

## 2022-10-21 DIAGNOSIS — F17210 Nicotine dependence, cigarettes, uncomplicated: Secondary | ICD-10-CM | POA: Diagnosis present

## 2022-10-21 DIAGNOSIS — I5022 Chronic systolic (congestive) heart failure: Secondary | ICD-10-CM | POA: Diagnosis present

## 2022-10-21 DIAGNOSIS — C801 Malignant (primary) neoplasm, unspecified: Secondary | ICD-10-CM | POA: Diagnosis not present

## 2022-10-21 DIAGNOSIS — Z8249 Family history of ischemic heart disease and other diseases of the circulatory system: Secondary | ICD-10-CM

## 2022-10-21 DIAGNOSIS — E782 Mixed hyperlipidemia: Secondary | ICD-10-CM | POA: Diagnosis present

## 2022-10-21 DIAGNOSIS — Z8505 Personal history of malignant neoplasm of liver: Secondary | ICD-10-CM

## 2022-10-21 DIAGNOSIS — E785 Hyperlipidemia, unspecified: Secondary | ICD-10-CM | POA: Diagnosis present

## 2022-10-21 DIAGNOSIS — Z7901 Long term (current) use of anticoagulants: Secondary | ICD-10-CM

## 2022-10-21 DIAGNOSIS — I255 Ischemic cardiomyopathy: Secondary | ICD-10-CM | POA: Diagnosis present

## 2022-10-21 DIAGNOSIS — C240 Malignant neoplasm of extrahepatic bile duct: Secondary | ICD-10-CM | POA: Diagnosis not present

## 2022-10-21 DIAGNOSIS — Z9889 Other specified postprocedural states: Secondary | ICD-10-CM

## 2022-10-21 DIAGNOSIS — I130 Hypertensive heart and chronic kidney disease with heart failure and stage 1 through stage 4 chronic kidney disease, or unspecified chronic kidney disease: Secondary | ICD-10-CM | POA: Diagnosis present

## 2022-10-21 DIAGNOSIS — K838 Other specified diseases of biliary tract: Secondary | ICD-10-CM | POA: Diagnosis not present

## 2022-10-21 DIAGNOSIS — E1122 Type 2 diabetes mellitus with diabetic chronic kidney disease: Secondary | ICD-10-CM | POA: Diagnosis present

## 2022-10-21 DIAGNOSIS — C248 Malignant neoplasm of overlapping sites of biliary tract: Secondary | ICD-10-CM | POA: Diagnosis not present

## 2022-10-21 DIAGNOSIS — Z7984 Long term (current) use of oral hypoglycemic drugs: Secondary | ICD-10-CM | POA: Diagnosis not present

## 2022-10-21 DIAGNOSIS — E119 Type 2 diabetes mellitus without complications: Secondary | ICD-10-CM | POA: Diagnosis not present

## 2022-10-21 DIAGNOSIS — N400 Enlarged prostate without lower urinary tract symptoms: Secondary | ICD-10-CM | POA: Diagnosis present

## 2022-10-21 LAB — CBC WITH DIFFERENTIAL/PLATELET
Abs Immature Granulocytes: 0.02 10*3/uL (ref 0.00–0.07)
Basophils Absolute: 0 10*3/uL (ref 0.0–0.1)
Basophils Relative: 1 %
Eosinophils Absolute: 0.1 10*3/uL (ref 0.0–0.5)
Eosinophils Relative: 3 %
HCT: 27.7 % — ABNORMAL LOW (ref 39.0–52.0)
Hemoglobin: 9.8 g/dL — ABNORMAL LOW (ref 13.0–17.0)
Immature Granulocytes: 0 %
Lymphocytes Relative: 19 %
Lymphs Abs: 0.9 10*3/uL (ref 0.7–4.0)
MCH: 33.3 pg (ref 26.0–34.0)
MCHC: 35.4 g/dL (ref 30.0–36.0)
MCV: 94.2 fL (ref 80.0–100.0)
Monocytes Absolute: 1 10*3/uL (ref 0.1–1.0)
Monocytes Relative: 21 %
Neutro Abs: 2.8 10*3/uL (ref 1.7–7.7)
Neutrophils Relative %: 56 %
Platelets: 133 10*3/uL — ABNORMAL LOW (ref 150–400)
RBC: 2.94 MIL/uL — ABNORMAL LOW (ref 4.22–5.81)
RDW: 14.3 % (ref 11.5–15.5)
WBC: 4.9 10*3/uL (ref 4.0–10.5)
nRBC: 0 % (ref 0.0–0.2)

## 2022-10-21 LAB — COMPREHENSIVE METABOLIC PANEL
ALT: 68 U/L — ABNORMAL HIGH (ref 0–44)
AST: 70 U/L — ABNORMAL HIGH (ref 15–41)
Albumin: 2.7 g/dL — ABNORMAL LOW (ref 3.5–5.0)
Alkaline Phosphatase: 257 U/L — ABNORMAL HIGH (ref 38–126)
Anion gap: 9 (ref 5–15)
BUN: 21 mg/dL (ref 8–23)
CO2: 23 mmol/L (ref 22–32)
Calcium: 8.7 mg/dL — ABNORMAL LOW (ref 8.9–10.3)
Chloride: 96 mmol/L — ABNORMAL LOW (ref 98–111)
Creatinine, Ser: 1.51 mg/dL — ABNORMAL HIGH (ref 0.61–1.24)
GFR, Estimated: 51 mL/min — ABNORMAL LOW (ref >60)
Glucose, Bld: 121 mg/dL — ABNORMAL HIGH (ref 70–99)
Potassium: 4.3 mmol/L (ref 3.5–5.1)
Sodium: 128 mmol/L — ABNORMAL LOW (ref 135–145)
Total Bilirubin: 2.4 mg/dL — ABNORMAL HIGH (ref 0.3–1.2)
Total Protein: 6.1 g/dL — ABNORMAL LOW (ref 6.5–8.1)

## 2022-10-21 LAB — LACTIC ACID, PLASMA
Lactic Acid, Venous: 1.5 mmol/L (ref 0.5–1.9)
Lactic Acid, Venous: 1.7 mmol/L (ref 0.5–1.9)

## 2022-10-21 NOTE — H&P (Signed)
History and Physical    Jonathan Keith [id number] DOB: 1956-08-06 DOA: 10/21/2022  PCP: Redmond School, MD  Patient coming from: Home  I have personally briefly reviewed patient's old medical records in [place]  Chief Complaint: Biliary obstruction  HPI: Jonathan Keith is a 67 y.o. male with medical history significant for metastatic cholangiocarcinoma with malignant ascites, CAD with ischemic cardiomyopathy (EF 45-50%), recent LV thrombus treated with Coumadin (now on aspirin alone), CKD stage IIIa, anemia of chronic disease, HLD who presented to the ED due to concern for biliary obstruction.  Patient has known metastatic cholangiocarcinoma with malignant ascites and prior sphincterotomy and plastic biliary stent placement 02/2021.  He is following with oncology, Dr. Delton Coombes, in [place] and is on active treatment with gemcitabine and cisplatin.  MRCP on 10/19/2022 showed similar appearing moderate intrahepatic biliary ductal dilatation with CBD stent in unchanged position.  No visualized mass seen.  Single large gallstone contracted in the central gallbladder near the gallbladder neck was also noted.  He underwent paracentesis same day 3/11 with removal of 3.5 L ascitic fluid.  His oncologist discussed the case with GI, Dr. Rush Landmark.  Due to no outpatient ERCP availability but decision was made for patient to present to the ED with plan for admission and anticipated ERCP and possible stenting on 3/15.***  ED Course  Labs/Imaging on admission: I have personally reviewed following labs and imaging studies.  Initial vitals showed BP 96/60, pulse 86, RR 17, temp 97.8 F, SpO2 97% on room air.  Labs showed WBC 4.9, hemoglobin 9.8, platelets 133,000, sodium 128, potassium 4.3, bicarb 23, BUN 21, creatinine 1.51, serum glucose 121, AST 70, ALT 68, alk phos 257, total bilirubin 2.4, lactic acid 1.5.  EDP discussed with GI, Dr. Rush Landmark, who recommended medical admission  with anticipation for ERCP and possible stenting on Friday 3/15.  The hospitalist service was consulted to admit for further evaluation and management.  Review of Systems: All systems reviewed and are negative except as documented in history of present illness above.   Past Medical History:  Diagnosis Date   Cancer ([place])    CHF (congestive heart failure) (HCC)    Chronic pain    neck, back, knees   Class 1 obesity due to excess calories with body mass index (BMI) of 31.0 to 31.9 in adult    Claudication [facility])    Coronary artery disease    DDD (degenerative disc disease), cervical    Diabetes mellitus ([place])    Dyslipidemia    Hypertension    Port-A-Cath in place 07/20/2021   Tobacco dependence     Past Surgical History:  Procedure Laterality Date   BILIARY BRUSHING N/A 02/25/2021   Procedure: BILIARY BRUSHING;  Surgeon: Rogene Houston, MD;  Location: [facility];  Service: Gastroenterology;  Laterality: N/A;   BILIARY STENT PLACEMENT N/A 02/25/2021   Procedure: BILIARY STENT PLACEMENT;  Surgeon: Rogene Houston, MD;  Location: [facility];  Service: Gastroenterology;  Laterality: N/A;   BILIARY STENT PLACEMENT  02/27/2021   Procedure: BILIARY STENT PLACEMENT (10FR x 9cm) IN THE RIGHT SYSTEM;  Surgeon: Rogene Houston, MD;  Location: [facility];  Service: Gastroenterology;;   BREAST SURGERY Left    benign lump- in his 78s.   CARDIAC CATHETERIZATION     ERCP N/A 02/25/2021   Procedure: ENDOSCOPIC RETROGRADE CHOLANGIOPANCREATOGRAPHY (ERCP);  Surgeon: Rogene Houston, MD;  Location: [facility];  Service: Gastroenterology;  Laterality: N/A;   ERCP N/A 02/27/2021  Procedure: ENDOSCOPIC RETROGRADE CHOLANGIOPANCREATOGRAPHY (ERCP);  Surgeon: Rogene Houston, MD;  Location: [facility];  Service: Gastroenterology;  Laterality: N/A;   IR IMAGING GUIDED PORT INSERTION  07/21/2021   IR REMOVAL TUN ACCESS W/ PORT W/O FL MOD SED  09/30/2022   KNEE SURGERY Left    x2   LEFT HEART CATH AND CORONARY ANGIOGRAPHY  N/A 09/08/2021   Procedure: LEFT HEART CATH AND CORONARY ANGIOGRAPHY;  Surgeon: Early Osmond, MD;  Location: [facility];  Service: Cardiovascular;  Laterality: N/A;   SPHINCTEROTOMY N/A 02/25/2021   Procedure: SPHINCTEROTOMY;  Surgeon: Rogene Houston, MD;  Location: [facility];  Service: Gastroenterology;  Laterality: N/A;   STENT REMOVAL  02/27/2021   Procedure: STENT REMOVAL (8.5Fr x 9cm);  Surgeon: Rogene Houston, MD;  Location: [facility];  Service: Gastroenterology;;    Social History:  reports that he has been smoking cigarettes. He has a 28.00 pack-year smoking history. He has never used smokeless tobacco. He reports that he does not currently use alcohol. He reports that he does not use drugs.  No Known Allergies  Family History  Problem Relation Age of Onset   CAD Mother    Cancer Neg Hx      Prior to Admission medications   Medication Sig Start Date End Date Taking? Authorizing Provider  acetaminophen (TYLENOL) 500 MG tablet Take 500 mg by mouth every 6 (six) hours as needed for moderate pain or headache.    [provider]  albuterol (VENTOLIN HFA) 108 (90 Base) MCG/ACT inhaler Inhale 2 puffs into the lungs every 6 (six) hours as needed for wheezing or shortness of breath. 11/29/21   Tat, Shanon Brow, MD  alfuzosin (UROXATRAL) 10 MG 24 hr tablet Take 1 tablet (10 mg total) by mouth at bedtime. 08/21/22   McKenzie, Candee Furbish, MD  atorvastatin (LIPITOR) 40 MG tablet Take 1 tablet (40 mg total) by mouth daily at 6 PM. 12/05/21   O'Neal, Cassie Freer, MD  carvedilol (COREG) 6.25 MG tablet Take 1 tablet (6.25 mg total) by mouth 2 (two) times daily. 06/12/22   O'NealCassie Freer, MD  CISPLATIN IV Inject into the vein once a week. Days 1, 8 every 21 days 07/22/21   [provider]  Durvalumab (IMFINZI IV) Inject into the vein every 21 ( twenty-one) days. 07/22/21   [provider]  famotidine (PEPCID) 20 MG tablet Take 20 mg by mouth 2 (two) times daily.     [provider]  ferrous sulfate (FERROUSUL) 325 (65 FE) MG tablet Take 1 tablet (325 mg total) by mouth daily with breakfast. 03/18/21   Rehman, Mechele Dawley, MD  Gemcitabine HCl (GEMZAR IV) Inject into the vein once a week. Days 1, 8 every 21 days 07/22/21   [provider]  Ginseng 100 MG CAPS Take 1 capsule by mouth daily.    [provider]  lidocaine-prilocaine (EMLA) cream  05/27/22   [provider]  lisinopril (ZESTRIL) 2.5 MG tablet Take 1 tablet (2.5 mg total) by mouth daily. 09/11/22 09/06/23  Mallipeddi, Vishnu P, MD  loratadine (CLARITIN) 10 MG tablet Take 10 mg by mouth daily. Takes for a few days after shot to boost WBC. About every 2nd treatment    [provider]  metFORMIN (GLUCOPHAGE) 500 MG tablet Take 1 tablet (500 mg total) by mouth 2 (two) times daily with a meal. 05/29/21 01/13/22  Dessa Phi, DO  Misc. Devices KIT 1 Box by Does not apply route every  7 (seven) days. 10/20/22   Derek Jack, MD  nitroGLYCERIN (NITROSTAT) 0.4 MG SL tablet Place 1 tablet (0.4 mg total) under the tongue every 5 (five) minutes as needed for chest pain. 09/12/21 09/12/22  Ledora Bottcher, PA  pantoprazole (PROTONIX) 40 MG tablet Take 1 tablet (40 mg total) by mouth daily. 12/05/21   O'Neal, Cassie Freer, MD  prochlorperazine (COMPAZINE) 10 MG tablet TAKE (1) TABLET BY MOUTH EVERY (6) HOURS AS NEEDED. 09/28/22   Derek Jack, MD  sodium chloride flush (NS) 0.9 % SOLN 10 mLs by Intracatheter route every 7 (seven) days. 10/20/22   Derek Jack, MD  tamsulosin (FLOMAX) 0.4 MG CAPS capsule TAKE (2) CAPSULES BY MOUTH AT BEDTIME. 05/08/22   Derek Jack, MD  Vitamin D, Ergocalciferol, (DRISDOL) 1.25 MG (50000 UNIT) CAPS capsule Take 1 capsule by mouth once a week. 06/29/22   [provider]  Wound Dressings (INTRASITE GEL APPLIPAK) GEL Apply 1 Units topically daily. 10/05/22   Derek Jack, MD    Physical Exam: Vitals:    10/21/22 2206 10/21/22 2233 10/21/22 2300 10/21/22 2330  BP:  110/66 107/67 110/76  Pulse:  84 82 91  Resp:  '14 16 14  '$ Temp: 97.9 F (36.6 C)     TempSrc: Oral     SpO2:  97% 96% 98%  Weight:      Height:       *** Constitutional: NAD, calm, comfortable Eyes: PERRL, lids and conjunctivae normal ENMT: Mucous membranes are moist. Posterior pharynx clear of any exudate or lesions.Normal dentition.  Neck: normal, supple, no masses. Respiratory: clear to auscultation bilaterally, no wheezing, no crackles. Normal respiratory effort. No accessory muscle use.  Cardiovascular: Regular rate and rhythm, no murmurs / rubs / gallops. No extremity edema. 2+ pedal pulses. Abdomen: no tenderness, no masses palpated. No hepatosplenomegaly. Bowel sounds positive.  Musculoskeletal: no clubbing / cyanosis. No joint deformity upper and lower extremities. Good ROM, no contractures. Normal muscle tone.  Skin: no rashes, lesions, ulcers. No induration Neurologic: CN 2-12 grossly intact. Sensation intact. Strength 5/5 in all 4.  Psychiatric: Normal judgment and insight. Alert and oriented x 3. Normal mood.   EKG: Not performed  Assessment/Plan Principal Problem:   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 [facility]) Active Problems:   HLD (hyperlipidemia)   Elevated LFTs   Cholangiocarcinoma (HCC)   Hyponatremia   Coronary artery disease   Chronic kidney disease, stage 3a (HCC)   Anemia of chronic disease   *** No notes on file *** Assessment and Plan: No notes have been filed under this hospital service. Service: Hospitalist  Biliary obstruction in setting of metastatic cholangiocarcinoma s/p prior CBD stent: ***  Malignant ascites: S/p paracentesis on 3/11 with 3.5 L fluid pulled off.  He was given 50 g IV albumin with procedure.***  Coronary artery disease with ischemic cardiomyopathy (EF 45-50%): ***  History of LV thrombus: ***  Anemia of chronic disease: ***  Hyponatremia: ***  CKD  stage IIIa: ***  Hyperlipidemia: ***   DVT prophylaxis: ***  Code Status: ***  Family Communication: ***  Disposition Plan: ***  Consults called: GI, Dr. Rush Landmark Severity of Illness: The appropriate patient status for this patient is INPATIENT. Inpatient status is judged to be reasonable and necessary in order to provide the required intensity of service to ensure the patient's safety. The patient's presenting symptoms, physical exam findings, and initial radiographic and laboratory data in the context of their chronic comorbidities is felt to place them at high  risk for further clinical deterioration. Furthermore, it is not anticipated that the patient will be medically stable for discharge from the hospital within 2 midnights of admission.   * I certify that at the point of admission it is my clinical judgment that the patient will require inpatient hospital care spanning beyond 2 midnights from the point of admission due to high intensity of service, high risk for further deterioration and high frequency of surveillance required.Zada Finders MD [facility]  If 7PM-7AM, please contact night-coverage [email or web address]  10/21/2022, 11:36 PM

## 2022-10-21 NOTE — ED Notes (Signed)
Called for vitals. No response. 

## 2022-10-21 NOTE — ED Provider Notes (Signed)
[facility] Provider Note  CSN: [id number] Arrival date & time: 10/21/22 1134  Chief Complaint(s) Emesis  HPI Jonathan Keith is a 67 y.o. male history of CHF, hyperlipidemia, hypertension, cholangiocarcinoma presenting to the emergency department at the request of his oncologist.  He does report mild right upper quadrant pain which is chronic.  He reports 1 episode of nausea and vomiting yesterday.  He denies any current nausea and vomiting.  Denies any hematemesis, melena.  Denies any fevers or chills.  Denies any chest pain or shortness of breath.  He reports that he saw his oncologist recently, his oncologist was concerned about progression of his cholangiocarcinoma and biliary obstruction, discussed with interventional GI physician Dr. Rush Landmark, who has no outpatient availability to perform this procedure.  He recommended the patient come to the emergency department for admission.   Past Medical History Past Medical History:  Diagnosis Date   Cancer ([place])    CHF (congestive heart failure) (HCC)    Chronic pain    neck, back, knees   Class 1 obesity due to excess calories with body mass index (BMI) of 31.0 to 31.9 in adult    Claudication [facility])    Coronary artery disease    DDD (degenerative disc disease), cervical    Diabetes mellitus ([place])    Dyslipidemia    Hypertension    Port-A-Cath in place 07/20/2021   Tobacco dependence    Patient Active Problem List   Diagnosis Date Noted   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 ([place]) 10/21/2022   Chronic kidney disease, stage 3a ([place]) 10/21/2022   Anemia of chronic disease 10/21/2022   Ischemic cardiomyopathy 09/11/2022   AKI (acute kidney injury) ([place]) 09/11/2022   Thrombus in heart chamber 06/09/2022   Encounter for therapeutic drug monitoring 06/09/2022   Lobar pneumonia ([place]) 11/28/2021   Acute on chronic systolic CHF (congestive heart failure) ([place]) 11/27/2021   Coronary artery  disease 11/27/2021   Tobacco abuse 11/27/2021   Acute respiratory failure with hypoxia ([place]) 11/27/2021   Elevated troponin 11/27/2021   NSTEMI (non-ST elevated myocardial infarction) ([place]) 09/08/2021   ACS (acute coronary syndrome) ([place]) 09/08/2021   Nausea without vomiting 09/01/2021   Dehydration 07/23/2021   Port-A-Cath in place 07/20/2021   Atherosclerosis of native arteries of extremities with intermittent claudication, bilateral legs ([place]) 06/30/2021   Abnormal levels of other serum enzymes 06/30/2021   Nicotine dependence, cigarettes, uncomplicated 0000000   Biliary tract cancer ([place]) 06/12/2021   Hyperbilirubinemia    Sepsis ([place]) 05/25/2021   SIRS (systemic inflammatory response syndrome) ([place]) 04/28/2021   Cholangiocarcinoma ([place]) 04/10/2021   Calculus of bile duct without cholangitis or cholecystitis without obstruction 04/10/2021   Acute febrile illness 03/12/2021   Acute cholecystitis 03/12/2021   Diverticulitis 03/12/2021   Transaminitis 03/12/2021   Leukocytosis 03/12/2021   Hyponatremia 03/12/2021   Hyperglycemia due to diabetes mellitus ([place]) 03/12/2021   GERD (gastroesophageal reflux disease) 03/12/2021   Hypoalbuminemia due to protein-calorie malnutrition ([place]) 03/12/2021   Type 2 diabetes mellitus without complications ([place]) [id number]   Gastroesophageal reflux disease 03/12/2021   Elevated LFTs    Choledocholithiasis    Obstructive jaundice 02/24/2021   Class 1 obesity due to excess calories with body mass index (BMI) of 31.0 to 31.9 in adult 02/24/2021   Controlled diabetes mellitus type 2 with complications ([place]) 99991111   HLD (hyperlipidemia) 02/24/2021   Hypertension 02/24/2021   Tobacco dependence 02/24/2021   Elevated blood-pressure reading, without diagnosis of hypertension  02/24/2021   Home Medication(s) Prior to Admission medications   Medication Sig Start Date End Date Taking? Authorizing Provider  acetaminophen (TYLENOL) 500 MG tablet  Take 500 mg by mouth every 6 (six) hours as needed for moderate pain or headache.    [provider]  albuterol (VENTOLIN HFA) 108 (90 Base) MCG/ACT inhaler Inhale 2 puffs into the lungs every 6 (six) hours as needed for wheezing or shortness of breath. 11/29/21   Tat, Shanon Brow, MD  alfuzosin (UROXATRAL) 10 MG 24 hr tablet Take 1 tablet (10 mg total) by mouth at bedtime. 08/21/22   McKenzie, Candee Furbish, MD  atorvastatin (LIPITOR) 40 MG tablet Take 1 tablet (40 mg total) by mouth daily at 6 PM. 12/05/21   O'Neal, Cassie Freer, MD  carvedilol (COREG) 6.25 MG tablet Take 1 tablet (6.25 mg total) by mouth 2 (two) times daily. 06/12/22   O'NealCassie Freer, MD  CISPLATIN IV Inject into the vein once a week. Days 1, 8 every 21 days 07/22/21   [provider]  Durvalumab (IMFINZI IV) Inject into the vein every 21 ( twenty-one) days. 07/22/21   [provider]  famotidine (PEPCID) 20 MG tablet Take 20 mg by mouth 2 (two) times daily.    [provider]  ferrous sulfate (FERROUSUL) 325 (65 FE) MG tablet Take 1 tablet (325 mg total) by mouth daily with breakfast. 03/18/21   Rehman, Mechele Dawley, MD  Gemcitabine HCl (GEMZAR IV) Inject into the vein once a week. Days 1, 8 every 21 days 07/22/21   [provider]  Ginseng 100 MG CAPS Take 1 capsule by mouth daily.    [provider]  lidocaine-prilocaine (EMLA) cream  05/27/22   [provider]  lisinopril (ZESTRIL) 2.5 MG tablet Take 1 tablet (2.5 mg total) by mouth daily. 09/11/22 09/06/23  Mallipeddi, Vishnu P, MD  loratadine (CLARITIN) 10 MG tablet Take 10 mg by mouth daily. Takes for a few days after shot to boost WBC. About every 2nd treatment    [provider]  metFORMIN (GLUCOPHAGE) 500 MG tablet Take 1 tablet (500 mg total) by mouth 2 (two) times daily with a meal. 05/29/21 01/13/22  Dessa Phi, DO  Misc. Devices KIT 1 Box by Does not apply route every 7 (seven) days. 10/20/22   Derek Jack, MD  nitroGLYCERIN (NITROSTAT) 0.4 MG SL tablet Place 1 tablet (0.4 mg total) under the tongue every 5 (five) minutes as needed for chest pain. 09/12/21 09/12/22  Ledora Bottcher, PA  pantoprazole (PROTONIX) 40 MG tablet Take 1 tablet (40 mg total) by mouth daily. 12/05/21   O'Neal, Cassie Freer, MD  prochlorperazine (COMPAZINE) 10 MG tablet TAKE (1) TABLET BY MOUTH EVERY (6) HOURS AS NEEDED. 09/28/22   Derek Jack, MD  sodium chloride flush (NS) 0.9 % SOLN 10 mLs by Intracatheter route every 7 (seven) days. 10/20/22   Derek Jack, MD  tamsulosin (FLOMAX) 0.4 MG CAPS capsule TAKE (2) CAPSULES BY MOUTH AT BEDTIME. 05/08/22   Derek Jack, MD  Vitamin D, Ergocalciferol, (DRISDOL) 1.25 MG (50000 UNIT) CAPS capsule Take 1 capsule by mouth once a week. 06/29/22   [provider]  Wound Dressings (INTRASITE GEL APPLIPAK) GEL Apply 1 Units topically daily. 10/05/22   Derek Jack, MD  Past Surgical History Past Surgical History:  Procedure Laterality Date   BILIARY BRUSHING N/A 02/25/2021   Procedure: BILIARY BRUSHING;  Surgeon: Rogene Houston, MD;  Location: [facility];  Service: Gastroenterology;  Laterality: N/A;   BILIARY STENT PLACEMENT N/A 02/25/2021   Procedure: BILIARY STENT PLACEMENT;  Surgeon: Rogene Houston, MD;  Location: [facility];  Service: Gastroenterology;  Laterality: N/A;   BILIARY STENT PLACEMENT  02/27/2021   Procedure: BILIARY STENT PLACEMENT (10FR x 9cm) IN THE RIGHT SYSTEM;  Surgeon: Rogene Houston, MD;  Location: [facility];  Service: Gastroenterology;;   BREAST SURGERY Left    benign lump- in his 54s.   CARDIAC CATHETERIZATION     ERCP N/A 02/25/2021   Procedure: ENDOSCOPIC RETROGRADE CHOLANGIOPANCREATOGRAPHY (ERCP);  Surgeon: Rogene Houston, MD;  Location: [facility];  Service: Gastroenterology;  Laterality:  N/A;   ERCP N/A 02/27/2021   Procedure: ENDOSCOPIC RETROGRADE CHOLANGIOPANCREATOGRAPHY (ERCP);  Surgeon: Rogene Houston, MD;  Location: [facility];  Service: Gastroenterology;  Laterality: N/A;   IR IMAGING GUIDED PORT INSERTION  07/21/2021   IR REMOVAL TUN ACCESS W/ PORT W/O FL MOD SED  09/30/2022   KNEE SURGERY Left    x2   LEFT HEART CATH AND CORONARY ANGIOGRAPHY N/A 09/08/2021   Procedure: LEFT HEART CATH AND CORONARY ANGIOGRAPHY;  Surgeon: Early Osmond, MD;  Location: [facility];  Service: Cardiovascular;  Laterality: N/A;   SPHINCTEROTOMY N/A 02/25/2021   Procedure: SPHINCTEROTOMY;  Surgeon: Rogene Houston, MD;  Location: [facility];  Service: Gastroenterology;  Laterality: N/A;   STENT REMOVAL  02/27/2021   Procedure: STENT REMOVAL (8.5Fr x 9cm);  Surgeon: Rogene Houston, MD;  Location: [facility];  Service: Gastroenterology;;   Family History Family History  Problem Relation Age of Onset   CAD Mother    Cancer Neg Hx     Social History Social History   Tobacco Use   Smoking status: Every Day    Packs/day: 0.50    Years: 56.00    Additional pack years: 0.00    Total pack years: 28.00    Types: Cigarettes   Smokeless tobacco: Never  Vaping Use   Vaping Use: Never used  Substance Use Topics   Alcohol use: Not Currently    Comment: stopped 2.5 years ago 03/11/21   Drug use: Never   Allergies Patient has no known allergies.  Review of Systems Review of Systems  All other systems reviewed and are negative.   Physical Exam Vital Signs  I have reviewed the triage vital signs BP 110/76   Pulse 91   Temp 97.9 F (36.6 C) (Oral)   Resp 14   Ht '5\' 11"'$  (1.803 m)   Wt 73 kg   SpO2 98%   BMI 22.45 kg/m  Physical Exam Vitals and nursing note reviewed.  Constitutional:      General: He is not in acute distress.    Appearance: Normal appearance.  HENT:     Mouth/Throat:     Mouth: Mucous membranes are moist.  Eyes:     General: Scleral icterus present.      Conjunctiva/sclera: Conjunctivae normal.  Cardiovascular:     Rate and Rhythm: Normal rate and regular rhythm.  Pulmonary:     Effort: Pulmonary effort is normal. No respiratory distress.     Breath sounds: Normal breath sounds.  Abdominal:     General: Abdomen is flat. There is distension (mild).     Palpations: Abdomen is soft.  Tenderness: There is abdominal tenderness (minimal RUQ. no peritonitic signs).  Musculoskeletal:     Right lower leg: No edema.     Left lower leg: No edema.  Skin:    General: Skin is warm and dry.     Capillary Refill: Capillary refill takes less than 2 seconds.     Coloration: Skin is jaundiced.  Neurological:     Mental Status: He is alert and oriented to person, place, and time. Mental status is at baseline.  Psychiatric:        Mood and Affect: Mood normal.        Behavior: Behavior normal.     ED Results and Treatments Labs (all labs ordered are listed, but only abnormal results are displayed) Labs Reviewed  CBC WITH DIFFERENTIAL/PLATELET - Abnormal; Notable for the following components:      Result Value   RBC 2.94 (*)    Hemoglobin 9.8 (*)    HCT 27.7 (*)    Platelets 133 (*)    All other components within normal limits  COMPREHENSIVE METABOLIC PANEL - Abnormal; Notable for the following components:   Sodium 128 (*)    Chloride 96 (*)    Glucose, Bld 121 (*)    Creatinine, Ser 1.51 (*)    Calcium 8.7 (*)    Total Protein 6.1 (*)    Albumin 2.7 (*)    AST 70 (*)    ALT 68 (*)    Alkaline Phosphatase 257 (*)    Total Bilirubin 2.4 (*)    GFR, Estimated 51 (*)    All other components within normal limits  LACTIC ACID, PLASMA  LACTIC ACID, PLASMA  CANCER ANTIGEN 19-9                                                                                                                          Radiology No results found.  Pertinent labs & imaging results that were available during my care of the patient were reviewed by me and  considered in my medical decision making (see MDM for details).  Medications Ordered in ED Medications - No data to display                                                                                                                                   Procedures Procedures  (including critical care time)  Medical Decision Making / ED  Course   MDM:  67 year old male presenting to the emergency department for possible biliary procedure.  Patient is overall well-appearing, vital signs reassuring.  No fever.  Discussed with Dr. Rush Landmark who confirms that he will be able to perform the procedure.  He anticipates procedure will be performed on Friday.  Recommends checking labs, holding anticoagulation, GI team will see him tomorrow.  He will be n.p.o. Thursday night for likely procedure Friday.  Low concern for other acute process.  Patient reports his abdominal pain is chronic.  He has no fevers or chills to suggest cholangitis.  He has no ongoing nausea or vomiting.  His labs show some chronic hyponatremia. Had recent MRCP without acute process and he denies change in his chronic symptoms. Does have gallstone but doubt acute cholecystitis. Discussed with the medicine physician Dr. Posey Pronto who will admit the patient for further preprocedural workup.      Additional history obtained: -Additional history obtained from spouse -External records from outside source obtained and reviewed including: Chart review including previous notes, labs, imaging, consultation notes including oncology note, MRCP few days ago   Lab Tests: -I ordered, reviewed, and interpreted labs.   The pertinent results include:   Labs Reviewed  CBC WITH DIFFERENTIAL/PLATELET - Abnormal; Notable for the following components:      Result Value   RBC 2.94 (*)    Hemoglobin 9.8 (*)    HCT 27.7 (*)    Platelets 133 (*)    All other components within normal limits  COMPREHENSIVE METABOLIC PANEL - Abnormal; Notable for  the following components:   Sodium 128 (*)    Chloride 96 (*)    Glucose, Bld 121 (*)    Creatinine, Ser 1.51 (*)    Calcium 8.7 (*)    Total Protein 6.1 (*)    Albumin 2.7 (*)    AST 70 (*)    ALT 68 (*)    Alkaline Phosphatase 257 (*)    Total Bilirubin 2.4 (*)    GFR, Estimated 51 (*)    All other components within normal limits  LACTIC ACID, PLASMA  LACTIC ACID, PLASMA  CANCER ANTIGEN 19-9    Notable for hyponatremia, CKD   Medicines ordered and prescription drug management: No orders of the defined types were placed in this encounter.   -I have reviewed the patients home medicines and have made adjustments as needed   Consultations Obtained: I requested consultation with the GI physician Dr. Rush Landmark,  and discussed lab and imaging findings as well as pertinent plan - they recommend: admit for procedure   Cardiac Monitoring: The patient was maintained on a cardiac monitor.  I personally viewed and interpreted the cardiac monitored which showed an underlying rhythm of: NSR  Social Determinants of Health:  Diagnosis or treatment significantly limited by social determinants of health: current smoker   Reevaluation: After the interventions noted above, I reevaluated the patient and found that their symptoms have improved  Co morbidities that complicate the patient evaluation  Past Medical History:  Diagnosis Date   Cancer ([place])    CHF (congestive heart failure) (HCC)    Chronic pain    neck, back, knees   Class 1 obesity due to excess calories with body mass index (BMI) of 31.0 to 31.9 in adult    Claudication [facility])    Coronary artery disease    DDD (degenerative disc disease), cervical    Diabetes mellitus ([place])    Dyslipidemia    Hypertension  Port-A-Cath in place 07/20/2021   Tobacco dependence       Dispostion: Disposition decision including need for hospitalization was considered, and patient admitted to the hospital.    Final Clinical  Impression(s) / ED Diagnoses Final diagnoses:  Biliary obstruction     This chart was dictated using voice recognition software.  Despite best efforts to proofread,  errors can occur which can change the documentation meaning.    Cristie Hem, MD 10/21/22 [phone]

## 2022-10-21 NOTE — ED Triage Notes (Signed)
Pt sent d/t bile duct blockage.  Pt's wife reports the Surgeon is aware and expecting the Pt.  Pt recently had fluid removed from abdomen.  Denies pain.

## 2022-10-21 NOTE — Progress Notes (Signed)
Arne Cleveland, MD  Derek Jack, MD Cc: Donita Brooks D DECLINED No discreet mass to target for biopsy on most recent MR 10/19/22 DDH

## 2022-10-21 NOTE — ED Provider Triage Note (Signed)
Emergency Medicine Provider Triage Evaluation Note  Jonathan Keith , a 67 y.o. male  was evaluated in triage.  Pt sent here from [place] in order to see Jonathan Keith due to a blockage of his bile duct.  Ongoing history of cholangiocarcinoma, unable to have this procedure done in [place], therefore sent here for it.  Patient was told not to come as a preadmission, but rather go through the ED for admission due to significant elevated bilirubin. Did have one episode of vomiting yesterday.  Review of Systems  Positive: Vomiting, abdominal distention Negative: Fever,   Physical Exam  BP 96/60   Pulse 86   Temp 97.8 F (36.6 C) (Oral)   Resp 17   SpO2 97%  Gen:   Awake, no distress   Resp:  Normal effort  MSK:   Moves extremities without difficulty  Other:  Abdomen is distended with decreased bowel sounds.   Medical Decision Making  Medically screening exam initiated at 1:00 PM.  Appropriate orders placed.  Jonathan Keith was informed that the remainder of the evaluation will be completed by another provider, this initial triage assessment does not replace that evaluation, and the importance of remaining in the ED until their evaluation is complete.     Jonathan Fitting, PA-C 10/21/22 1304

## 2022-10-22 ENCOUNTER — Inpatient Hospital Stay: Payer: Medicare Other

## 2022-10-22 ENCOUNTER — Ambulatory Visit: Payer: Medicare Other | Admitting: Hematology

## 2022-10-22 DIAGNOSIS — K831 Obstruction of bile duct: Secondary | ICD-10-CM | POA: Diagnosis not present

## 2022-10-22 DIAGNOSIS — E871 Hypo-osmolality and hyponatremia: Secondary | ICD-10-CM

## 2022-10-22 DIAGNOSIS — C248 Malignant neoplasm of overlapping sites of biliary tract: Secondary | ICD-10-CM

## 2022-10-22 DIAGNOSIS — I129 Hypertensive chronic kidney disease with stage 1 through stage 4 chronic kidney disease, or unspecified chronic kidney disease: Secondary | ICD-10-CM

## 2022-10-22 DIAGNOSIS — D638 Anemia in other chronic diseases classified elsewhere: Secondary | ICD-10-CM

## 2022-10-22 DIAGNOSIS — I2510 Atherosclerotic heart disease of native coronary artery without angina pectoris: Secondary | ICD-10-CM

## 2022-10-22 DIAGNOSIS — N1831 Chronic kidney disease, stage 3a: Secondary | ICD-10-CM

## 2022-10-22 DIAGNOSIS — I255 Ischemic cardiomyopathy: Secondary | ICD-10-CM

## 2022-10-22 DIAGNOSIS — E119 Type 2 diabetes mellitus without complications: Secondary | ICD-10-CM

## 2022-10-22 DIAGNOSIS — C801 Malignant (primary) neoplasm, unspecified: Secondary | ICD-10-CM | POA: Diagnosis not present

## 2022-10-22 LAB — COMPREHENSIVE METABOLIC PANEL
ALT: 61 U/L — ABNORMAL HIGH (ref 0–44)
AST: 69 U/L — ABNORMAL HIGH (ref 15–41)
Albumin: 2.2 g/dL — ABNORMAL LOW (ref 3.5–5.0)
Alkaline Phosphatase: 238 U/L — ABNORMAL HIGH (ref 38–126)
Anion gap: 9 (ref 5–15)
BUN: 21 mg/dL (ref 8–23)
CO2: 24 mmol/L (ref 22–32)
Calcium: 8.4 mg/dL — ABNORMAL LOW (ref 8.9–10.3)
Chloride: 100 mmol/L (ref 98–111)
Creatinine, Ser: 1.4 mg/dL — ABNORMAL HIGH (ref 0.61–1.24)
GFR, Estimated: 55 mL/min — ABNORMAL LOW (ref >60)
Glucose, Bld: 110 mg/dL — ABNORMAL HIGH (ref 70–99)
Potassium: 4.3 mmol/L (ref 3.5–5.1)
Sodium: 133 mmol/L — ABNORMAL LOW (ref 135–145)
Total Bilirubin: 2.4 mg/dL — ABNORMAL HIGH (ref 0.3–1.2)
Total Protein: 5.3 g/dL — ABNORMAL LOW (ref 6.5–8.1)

## 2022-10-22 LAB — CBC
HCT: 26.3 % — ABNORMAL LOW (ref 39.0–52.0)
Hemoglobin: 8.9 g/dL — ABNORMAL LOW (ref 13.0–17.0)
MCH: 32.2 pg (ref 26.0–34.0)
MCHC: 33.8 g/dL (ref 30.0–36.0)
MCV: 95.3 fL (ref 80.0–100.0)
Platelets: 129 10*3/uL — ABNORMAL LOW (ref 150–400)
RBC: 2.76 MIL/uL — ABNORMAL LOW (ref 4.22–5.81)
RDW: 14.6 % (ref 11.5–15.5)
WBC: 4.4 10*3/uL (ref 4.0–10.5)
nRBC: 0 % (ref 0.0–0.2)

## 2022-10-22 LAB — URINALYSIS, ROUTINE W REFLEX MICROSCOPIC
Bilirubin Urine: NEGATIVE
Glucose, UA: NEGATIVE mg/dL
Hgb urine dipstick: NEGATIVE
Ketones, ur: NEGATIVE mg/dL
Leukocytes,Ua: NEGATIVE
Nitrite: NEGATIVE
Protein, ur: 30 mg/dL — AB
Specific Gravity, Urine: 1.017 (ref 1.005–1.030)
pH: 5 (ref 5.0–8.0)

## 2022-10-22 LAB — GLUCOSE, CAPILLARY
Glucose-Capillary: 152 mg/dL — ABNORMAL HIGH (ref 70–99)
Glucose-Capillary: 120 mg/dL — ABNORMAL HIGH (ref 70–99)
Glucose-Capillary: 117 mg/dL — ABNORMAL HIGH (ref 70–99)
Glucose-Capillary: 116 mg/dL — ABNORMAL HIGH (ref 70–99)
Glucose-Capillary: 168 mg/dL — ABNORMAL HIGH (ref 70–99)

## 2022-10-22 LAB — PROTIME-INR
INR: 1.2 (ref 0.8–1.2)
Prothrombin Time: 15.4 seconds — ABNORMAL HIGH (ref 11.4–15.2)

## 2022-10-22 LAB — HIV ANTIBODY (ROUTINE TESTING W REFLEX): HIV Screen 4th Generation wRfx: NONREACTIVE

## 2022-10-22 LAB — OSMOLALITY: Osmolality: 289 mOsm/kg (ref 275–295)

## 2022-10-22 LAB — OSMOLALITY, URINE: Osmolality, Ur: 526 mOsm/kg (ref 300–900)

## 2022-10-22 LAB — SODIUM, URINE, RANDOM: Sodium, Ur: 27 mmol/L

## 2022-10-22 MED ORDER — ONDANSETRON HCL 4 MG/2ML IJ SOLN: 4.0000 mg | Freq: Four times a day (QID) | INTRAMUSCULAR | Status: DC | PRN

## 2022-10-22 MED ORDER — INSULIN ASPART 100 UNIT/ML IJ SOLN
0.0000 [IU] | Freq: Three times a day (TID) | INTRAMUSCULAR | Status: DC
  Administered 2022-10-23: 2 [IU] via SUBCUTANEOUS
  Administered 2022-10-23: 1 [IU] via SUBCUTANEOUS

## 2022-10-22 MED ORDER — CHLORHEXIDINE GLUCONATE CLOTH 2 % EX PADS
6.0000 | MEDICATED_PAD | Freq: Every day | CUTANEOUS | Status: DC
  Administered 2022-10-22 – 2022-10-25 (×4): 6 via TOPICAL

## 2022-10-22 MED ORDER — PANTOPRAZOLE SODIUM 40 MG PO TBEC
40.0000 mg | DELAYED_RELEASE_TABLET | Freq: Every day | ORAL | Status: DC
  Administered 2022-10-22 – 2022-10-25 (×4): 40 mg via ORAL
  Filled 2022-10-22 (×4): qty 1

## 2022-10-22 MED ORDER — SENNOSIDES-DOCUSATE SODIUM 8.6-50 MG PO TABS: 1.0000 | ORAL_TABLET | Freq: Every evening | ORAL | Status: DC | PRN

## 2022-10-22 MED ORDER — LISINOPRIL 2.5 MG PO TABS
2.5000 mg | ORAL_TABLET | Freq: Every day | ORAL | Status: DC
  Administered 2022-10-23 – 2022-10-25 (×2): 2.5 mg via ORAL
  Filled 2022-10-22 (×4): qty 1

## 2022-10-22 MED ORDER — CARVEDILOL 6.25 MG PO TABS
6.2500 mg | ORAL_TABLET | Freq: Two times a day (BID) | ORAL | Status: DC
  Administered 2022-10-22 (×3): 6.25 mg via ORAL
  Filled 2022-10-22: qty 1
  Filled 2022-10-22: qty 2
  Filled 2022-10-22: qty 1

## 2022-10-22 MED ORDER — ALFUZOSIN HCL ER 10 MG PO TB24
10.0000 mg | ORAL_TABLET | Freq: Every day | ORAL | Status: DC
  Administered 2022-10-22 – 2022-10-24 (×3): 10 mg via ORAL
  Filled 2022-10-22 (×4): qty 1

## 2022-10-22 MED ORDER — SODIUM CHLORIDE 0.9 % IV SOLN: INTRAVENOUS | Status: DC

## 2022-10-22 MED ORDER — ONDANSETRON HCL 4 MG PO TABS: 4.0000 mg | ORAL_TABLET | Freq: Four times a day (QID) | ORAL | Status: DC | PRN

## 2022-10-22 NOTE — Consult Note (Signed)
Referring Provider:  ? Primary Care Physician:  Redmond School, MD Primary Gastroenterologist:  Dr. Laural Golden, Dr. Jenetta Downer  Reason for Consultation:  Biliary obstruction with cholangiocarcinoma  HPI: Jonathan Keith is a 66 y.o. male with complicated history of HTN, DM, Cholangiocarcinoma s/p biliary stenting and portal vein embolization at [facility] clinic in [place] in Nov [id number], complicated by recurrent Pseudomonas bacteremia and Enterococcus bacteremia requiring IV abx and exchange of biliary stents at [facility] on 05/27/21.  [facility] in November 2023 and was told that they could not proceed with hepatectomy as his cholangiocarcinoma had progressed too much for surgical intervention, wife reports they did change out his biliary stent to a metal stent at that time.  Dr. Rush Landmark was contacted by patient's oncologist, Dr. Tera Helper, about recent increasing LFTs/total bili.  Plan was for the patient to come to the ED at [facility] hospital for ERCP to see what further stenting/intervention could be done.  MRI abdomen/MRCP:   IMPRESSION: 1. Moderate intrahepatic biliary ductal dilatation, similar in appearance to prior examination. No directly visualized mass. Common bile duct stent, although poorly visualized by MRI is in unchanged positioned within the central common bile duct. 2. Small volume ascites similar to prior examination. 3. Single large gallstone contracted in the central gallbladder near the gallbladder neck. 4. Severe aortic atherosclerosis.   Aortic Atherosclerosis (ICD10-I70.0).  LFTs actually downtrending again today with total bilirubin 2.4, alk phos 257, ALT 68, AST 70.  Other PMH includes CAD with ischemic cardiomyopathy (EF 45-50%), recent LV thrombus s/p treatment with Coumadin (off of this now), CKD stage IIIa, anemia of chronic disease, HLD.  Past Medical History:  Diagnosis Date   Cancer ([place])    CHF (congestive heart failure) (HCC)    Chronic pain     neck, back, knees   Class 1 obesity due to excess calories with body mass index (BMI) of 31.0 to 31.9 in adult    Claudication [facility])    Coronary artery disease    DDD (degenerative disc disease), cervical    Diabetes mellitus ([place])    Dyslipidemia    Hypertension    Port-A-Cath in place 07/20/2021   Tobacco dependence     Past Surgical History:  Procedure Laterality Date   BILIARY BRUSHING N/A 02/25/2021   Procedure: BILIARY BRUSHING;  Surgeon: Rogene Houston, MD;  Location: [facility];  Service: Gastroenterology;  Laterality: N/A;   BILIARY STENT PLACEMENT N/A 02/25/2021   Procedure: BILIARY STENT PLACEMENT;  Surgeon: Rogene Houston, MD;  Location: [facility];  Service: Gastroenterology;  Laterality: N/A;   BILIARY STENT PLACEMENT  02/27/2021   Procedure: BILIARY STENT PLACEMENT (10FR x 9cm) IN THE RIGHT SYSTEM;  Surgeon: Rogene Houston, MD;  Location: [facility];  Service: Gastroenterology;;   BREAST SURGERY Left    benign lump- in his 5s.   CARDIAC CATHETERIZATION     ERCP N/A 02/25/2021   Procedure: ENDOSCOPIC RETROGRADE CHOLANGIOPANCREATOGRAPHY (ERCP);  Surgeon: Rogene Houston, MD;  Location: [facility];  Service: Gastroenterology;  Laterality: N/A;   ERCP N/A 02/27/2021   Procedure: ENDOSCOPIC RETROGRADE CHOLANGIOPANCREATOGRAPHY (ERCP);  Surgeon: Rogene Houston, MD;  Location: [facility];  Service: Gastroenterology;  Laterality: N/A;   IR IMAGING GUIDED PORT INSERTION  07/21/2021   IR REMOVAL TUN ACCESS W/ PORT W/O FL MOD SED  09/30/2022   KNEE SURGERY Left    x2   LEFT HEART CATH AND CORONARY ANGIOGRAPHY N/A 09/08/2021   Procedure: LEFT HEART CATH AND CORONARY  ANGIOGRAPHY;  Surgeon: Early Osmond, MD;  Location: [place] [facility];  Service: Cardiovascular;  Laterality: N/A;   SPHINCTEROTOMY N/A 02/25/2021   Procedure: SPHINCTEROTOMY;  Surgeon: Rogene Houston, MD;  Location: [facility];  Service: Gastroenterology;  Laterality: N/A;   STENT REMOVAL  02/27/2021   Procedure: STENT  REMOVAL (8.5Fr x 9cm);  Surgeon: Rogene Houston, MD;  Location: [facility];  Service: Gastroenterology;;    Prior to Admission medications   Medication Sig Start Date End Date Taking? Authorizing Provider  acetaminophen (TYLENOL) 500 MG tablet Take 500 mg by mouth every 6 (six) hours as needed for moderate pain or headache.   Yes [provider]  atorvastatin (LIPITOR) 40 MG tablet Take 1 tablet (40 mg total) by mouth daily at 6 PM. 12/05/21  Yes O'Neal, Cassie Freer, MD  carvedilol (COREG) 6.25 MG tablet Take 1 tablet (6.25 mg total) by mouth 2 (two) times daily. 06/12/22  Yes O'Neal, Cassie Freer, MD  famotidine (PEPCID) 20 MG tablet Take 20 mg by mouth 2 (two) times daily.   Yes [provider]  ferrous sulfate (FERROUSUL) 325 (65 FE) MG tablet Take 1 tablet (325 mg total) by mouth daily with breakfast. 03/18/21  Yes Rehman, Mechele Dawley, MD  furosemide (LASIX) 20 MG tablet Take 20 mg by mouth. If he gains weight five pounds when taking chemo, take one tab a day till he is back to normal weight or within 2 pounds for fluid check before surgery.   Yes [provider]  lidocaine-prilocaine (EMLA) cream  05/27/22  Yes [provider]  lisinopril (ZESTRIL) 2.5 MG tablet Take 1 tablet (2.5 mg total) by mouth daily. 09/11/22 09/06/23 Yes Mallipeddi, Vishnu P, MD  metFORMIN (GLUCOPHAGE) 500 MG tablet Take 500 mg by mouth 2 (two) times daily with a meal.   Yes [provider]  pantoprazole (PROTONIX) 40 MG tablet Take 1 tablet (40 mg total) by mouth daily. 12/05/21  Yes O'Neal, Cassie Freer, MD  prochlorperazine (COMPAZINE) 10 MG tablet TAKE (1) TABLET BY MOUTH EVERY (6) HOURS AS NEEDED. 09/28/22  Yes Derek Jack, MD  sodium chloride flush (NS) 0.9 % SOLN 10 mLs by Intracatheter route every 7 (seven) days. 10/20/22  Yes Derek Jack, MD  tamsulosin (FLOMAX) 0.4 MG CAPS capsule TAKE (2) CAPSULES BY MOUTH AT BEDTIME. 05/08/22  Yes Derek Jack, MD   Vitamin D, Ergocalciferol, (DRISDOL) 1.25 MG (50000 UNIT) CAPS capsule Take 1 capsule by mouth once a week. 06/29/22  Yes [provider]  warfarin (COUMADIN) 5 MG tablet Take 5 mg by mouth daily at 4 PM.   Yes [provider]  albuterol (VENTOLIN HFA) 108 (90 Base) MCG/ACT inhaler Inhale 2 puffs into the lungs every 6 (six) hours as needed for wheezing or shortness of breath. 11/29/21   Tat, Shanon Brow, MD  alfuzosin (UROXATRAL) 10 MG 24 hr tablet Take 1 tablet (10 mg total) by mouth at bedtime. 08/21/22   McKenzie, Candee Furbish, MD  CISPLATIN IV Inject into the vein once a week. Days 1, 8 every 21 days 07/22/21   [provider]  Durvalumab (IMFINZI IV) Inject into the vein every 21 ( twenty-one) days. 07/22/21   [provider]  Gemcitabine HCl (GEMZAR IV) Inject into the vein once a week. Days 1, 8 every 21 days 07/22/21   [provider]  Ginseng 100 MG CAPS Take 1 capsule by mouth daily.    [provider]  loratadine (CLARITIN) 10 MG tablet  Take 10 mg by mouth daily. Takes for a few days after shot to boost WBC. About every 2nd treatment    [provider]  metFORMIN (GLUCOPHAGE) 500 MG tablet Take 1 tablet (500 mg total) by mouth 2 (two) times daily with a meal. 05/29/21 01/13/22  Dessa Phi, DO  Misc. Devices KIT 1 Box by Does not apply route every 7 (seven) days. 10/20/22   Derek Jack, MD  nitroGLYCERIN (NITROSTAT) 0.4 MG SL tablet Place 1 tablet (0.4 mg total) under the tongue every 5 (five) minutes as needed for chest pain. 09/12/21 09/12/22  Ledora Bottcher, PA  Wound Dressings (INTRASITE GEL APPLIPAK) GEL Apply 1 Units topically daily. 10/05/22   Derek Jack, MD    Current Facility-Administered Medications  Medication Dose Route Frequency Provider Last Rate Last Admin   alfuzosin (UROXATRAL) 24 hr tablet 10 mg  10 mg Oral QHS Zada Finders R, MD   10 mg at 10/22/22 0100   carvedilol (COREG) tablet 6.25 mg  6.25  mg Oral BID Zada Finders R, MD   6.25 mg at 10/22/22 0057   Chlorhexidine Gluconate Cloth 2 % PADS 6 each  6 each Topical Q0600 Lenore Cordia, MD   6 each at 10/22/22 0538   insulin aspart (novoLOG) injection 0-9 Units  0-9 Units Subcutaneous TID WC Patel, Vishal R, MD       lisinopril (ZESTRIL) tablet 2.5 mg  2.5 mg Oral Daily Lenore Cordia, MD       ondansetron (ZOFRAN) tablet 4 mg  4 mg Oral Q6H PRN Lenore Cordia, MD       Or   ondansetron (ZOFRAN) injection 4 mg  4 mg Intravenous Q6H PRN Lenore Cordia, MD       pantoprazole (PROTONIX) EC tablet 40 mg  40 mg Oral Daily Lenore Cordia, MD       senna-docusate (Senokot-S) tablet 1 tablet  1 tablet Oral QHS PRN Lenore Cordia, MD        Allergies as of 10/21/2022   (No Known Allergies)    Family History  Problem Relation Age of Onset   CAD Mother    Cancer Neg Hx     Social History   Socioeconomic History   Marital status: Married    Spouse name: Not on file   Number of children: Not on file   Years of education: Not on file   Highest education level: Not on file  Occupational History   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  Tobacco Use   Smoking status: Every Day    Packs/day: 0.50    Years: 56.00    Additional pack years: 0.00    Total pack years: 28.00    Types: Cigarettes   Smokeless tobacco: Never  Vaping Use   Vaping Use: Never used  Substance and Sexual Activity   Alcohol use: Not Currently    Comment: stopped 2.5 years ago 03/11/21   Drug use: Never   Sexual activity: Not on file  Other Topics Concern   Not on file  Social History Narrative   Not on file   Social Determinants of Health   Financial Resource Strain: Medium Risk (03/07/2021)   Overall Financial Resource Strain (CARDIA)    Difficulty of Paying Living Expenses: Somewhat hard  Food Insecurity: No Food Insecurity (10/22/2022)   Hunger Vital Sign    Worried About Running Out of Food in the Last Year: Never true    Ran Out of Food  in  the Last Year: Never true  Transportation Needs: No Transportation Needs (10/22/2022)   PRAPARE - Hydrologist (Medical): No    Lack of Transportation (Non-Medical): No  Physical Activity: Insufficiently Active (03/07/2021)   Exercise Vital Sign    Days of Exercise per Week: 2 days    Minutes of Exercise per Session: 20 min  Stress: No Stress Concern Present (03/07/2021)   [place]    Feeling of Stress : Only a little  Social Connections: Moderately Integrated (03/07/2021)   Social Connection and Isolation Panel [NHANES]    Frequency of Communication with Friends and Family: More than three times a week    Frequency of Social Gatherings with Friends and Family: More than three times a week    Attends Religious Services: More than 4 times per year    Active Member of [employer] or Organizations: No    Attends Archivist Meetings: Never    Marital Status: Married  Human resources officer Violence: Not At Risk (10/22/2022)   Humiliation, Afraid, Rape, and Kick questionnaire    Fear of Current or Ex-Partner: No    Emotionally Abused: No    Physically Abused: No    Sexually Abused: No    Review of Systems: ROS is O/W negative except as mentioned in HPI.  Physical Exam: Vital signs in last 24 hours: Temp:  [97.8 F (36.6 C)-98.4 F (36.9 C)] 98.4 F (36.9 C) (03/14 0748) Pulse Rate:  [79-92] 81 (03/14 0748) Resp:  [14-18] 14 (03/14 0748) BP: (96-127)/(60-76) 98/61 (03/14 0748) SpO2:  [96 %-100 %] 97 % (03/14 0748) Weight:  [70.1 kg-73 kg] 70.1 kg (03/14 0156) Last BM Date :  (PTA) General:  Alert, Well-developed, well-nourished, pleasant and cooperative in NAD Head:  Normocephalic and atraumatic. Eyes:  Scleral icterus noted. Ears:  Normal auditory acuity. Mouth:  No deformity or lesions.   Lungs:  Clear throughout to auscultation.  No wheezes, crackles, or rhonchi.  Heart:  Regular  rate and rhythm; no murmurs, clicks, rubs, or gallops. Abdomen:  Soft, non-distended.  BS present.  Non-tender.    Msk:  Symmetrical without gross deformities. Pulses:  Normal pulses noted. Extremities:  Without clubbing or edema. Neurologic:  Alert and oriented x 4;  grossly normal neurologically. Skin:  Intact without significant lesions or rashes. Psych:  Alert and cooperative. Normal mood and affect.  Intake/Output from previous day: 03/13 0701 - 03/14 0700 In: -  Out: 100 [Urine:100]  Lab Results: Recent Labs    10/21/22 1305  WBC 4.9  HGB 9.8*  HCT 27.7*  PLT 133*   BMET Recent Labs    10/21/22 1305  NA 128*  K 4.3  CL 96*  CO2 23  GLUCOSE 121*  BUN 21  CREATININE 1.51*  CALCIUM 8.7*   LFT Recent Labs    10/21/22 1305  PROT 6.1*  ALBUMIN 2.7*  AST 70*  ALT 68*  ALKPHOS 257*  BILITOT 2.4*   IMPRESSION:  *67 year old with past medical history complicated by cholangiocarcinoma with multiple biliary stent replacements r/t cholangitis with Psuedomonoas and Enterococcus, currently on palliative Chemotherapy.   Stent placement across the right hepatic system by Dr. Laural Golden in 2022 then another metal stent at [facility] in 06/2022.  LFTs were up higher, but this AM back down again. *Recent LV thrombus s/p treatment with Coumadin, however has been off of that for a little while now per cardiology. *  Hyponatremia:  Na+ 128  PLAN: -ERCP with attempt at further stenting 3/15 per Dr. Rush Landmark. -Correction of low Na+ per hospitalist team prior to ERCP. -Trend labs/LFTs.   Laban Emperor. Satin Boal  10/22/2022, 9:36 AM

## 2022-10-22 NOTE — Hospital Course (Addendum)
Jonathan Keith is a 67 y.o. male with medical history significant for metastatic cholangiocarcinoma with malignant ascites, CAD with ischemic cardiomyopathy (EF 45-50%), recent LV thrombus treated with Coumadin, CKD stage IIIa, anemia of chronic disease, HLD who is admitted due to concern for biliary obstruction with plan for potential stenting by gastroenterology on 10/23/2022.

## 2022-10-22 NOTE — Progress Notes (Signed)
PROGRESS NOTE    Jonathan Keith  [id number] DOB: [birth year] DOA: 10/21/2022 PCP: Redmond School, MD   Brief Narrative:  HPI: Jonathan Keith is a 67 y.o. male with medical history significant for metastatic cholangiocarcinoma with malignant ascites, CAD with ischemic cardiomyopathy (EF 45-50%), recent LV thrombus s/p treatment with Coumadin, CKD stage IIIa, anemia of chronic disease, HLD who presented to the ED due to concern for biliary obstruction.   Patient has known metastatic cholangiocarcinoma with malignant ascites and prior sphincterotomy and plastic biliary stent placement 02/2021.  He is following with oncology, Dr. Delton Coombes, in [place] and is on active treatment with gemcitabine and cisplatin.   MRCP on 10/19/2022 showed similar appearing moderate intrahepatic biliary ductal dilatation with CBD stent in unchanged position.  No visualized mass seen.  Single large gallstone contracted in the central gallbladder near the gallbladder neck was also noted.  He underwent paracentesis same day 3/11 with removal of 3.5 L ascitic fluid.   His oncologist discussed the case with GI, Dr. Rush Landmark.  Due to no outpatient ERCP availability but decision was made for patient to present to the ED with plan for admission and anticipated ERCP and possible stenting on 3/15.   Patient states that he has continued right upper quadrant abdominal pain not really changed from his baseline.  Abdominal swelling has gone down with paracentesis.  He has occasional night sweats but denies fevers.   ED Course  Labs/Imaging on admission: I have personally reviewed following labs and imaging studies.   Initial vitals showed BP 96/60, pulse 86, RR 17, temp 97.8 F, SpO2 97% on room air.   Labs showed WBC 4.9, hemoglobin 9.8, platelets 133,000, sodium 128, potassium 4.3, bicarb 23, BUN 21, creatinine 1.51, serum glucose 121, AST 70, ALT 68, alk phos 257, total bilirubin 2.4, lactic acid 1.5.   EDP  discussed with GI, Dr. Rush Landmark, who recommended medical admission with anticipation for ERCP and possible stenting on Friday 3/15.  The hospitalist service was consulted to admit for further evaluation and management.  Assessment & Plan:   Principal Problem:   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 [facility]) Active Problems:   Controlled diabetes mellitus type 2 with complications (HCC)   HLD (hyperlipidemia)   Elevated LFTs   Cholangiocarcinoma ([place])   Hyponatremia   Coronary artery disease   Chronic kidney disease, stage 3a (HCC)   Anemia of chronic disease  Biliary obstruction in setting of metastatic cholangiocarcinoma s/p prior CBD stent 2022: Admitted due to concern for biliary obstruction.  MRCP 3/11 showed CBD stent within the central common bile duct and intrahepatic biliary ductal dilatation.  LFTs remain persistently elevated but improved from 2 days ago. -GI, Dr. Rush Landmark has been consulted and plan for ERCP on 10/23/2022.   Malignant ascites: S/p paracentesis on 3/11 with 3.5 L fluid pulled off.  He was given 50 g IV albumin with procedure.  Currently very minimal ascites.  No need for paracentesis.   Coronary artery disease with ischemic cardiomyopathy (EF 45-50%): Denies chest pain.  Continue Coreg and lisinopril.   History of LV thrombus: Now off Coumadin after repeat echo 09/14/2022 did not show evidence of persistent LV thrombus.   Anemia of chronic disease: Hemoglobin stable, continue to monitor.   Chronic hyponatremia: Baseline between 127-133.  Currently around baseline.   CKD stage IIIa: At baseline.  Monitor.   Type 2 diabetes: Holding metformin and placed on SSI.   Hyperlipidemia: Hold statin for now due to elevated LFTs.  BPH: Continue alfuzosin.  He has chronic difficulty urinating due to BPH and known bladder calculi.  He follows with urology, Dr. Alyson Ingles, and is planned for cystoscopy with litholapaxy on 11/19/22.  DVT prophylaxis: SCDs Start:  10/22/22 0025   Code Status: Full Code  Family Communication: Wife present at bedside.  Plan of care discussed with patient in length and he/she verbalized understanding and agreed with it.  Status is: Inpatient Remains inpatient appropriate because: Scheduled for ERCP 10/23/2022   Estimated body mass index is 21.56 kg/m as calculated from the following:   Height as of this encounter: '5\' 11"'$  (1.803 m).   Weight as of this encounter: 70.1 kg.    Nutritional Assessment: Body mass index is 21.56 kg/m.Marland Kitchen Seen by dietician.  I agree with the assessment and plan as outlined below: Nutrition Status:        . Skin Assessment: I have examined the patient's skin and I agree with the wound assessment as performed by the wound care RN as outlined below:    Consultants:  GI and oncology  Procedures:  As above  Antimicrobials:  Anti-infectives (From admission, onward)    None         Subjective: Patient seen and examined.  Wife at the bedside.  Patient has no complaints.  Denies any abdominal pain.  Objective: Vitals:   10/22/22 0120 10/22/22 0156 10/22/22 0501 10/22/22 0748  BP: 127/60  104/62 98/61  Pulse: 84  92 81  Resp: '17  17 14  '$ Temp: 98.2 F (36.8 C)  98.3 F (36.8 C) 98.4 F (36.9 C)  TempSrc: Oral  Oral Oral  SpO2: 100%  97% 97%  Weight:  70.1 kg    Height:        Intake/Output Summary (Last 24 hours) at 10/22/2022 1018 Last data filed at 10/22/2022 0600 Gross per 24 hour  Intake --  Output 100 ml  Net -100 ml   Filed Weights   10/21/22 1304 10/22/22 0156  Weight: 73 kg 70.1 kg    Examination:  General exam: Appears calm and comfortable, appears icteric Respiratory system: Clear to auscultation. Respiratory effort normal. Cardiovascular system: S1 & S2 heard, RRR. No JVD, murmurs, rubs, gallops or clicks. No pedal edema. Gastrointestinal system: Abdomen is nondistended, soft and nontender. No organomegaly or masses felt. Normal bowel sounds  heard. Central nervous system: Alert and oriented. No focal neurological deficits. Extremities: Symmetric 5 x 5 power. Skin: No rashes, lesions or ulcers Psychiatry: Judgement and insight appear normal. Mood & affect appropriate.    Data Reviewed: I have personally reviewed following labs and imaging studies  CBC: Recent Labs  Lab 10/19/22 0811 10/21/22 1305  WBC 6.3 4.9  NEUTROABS 4.6 2.8  HGB 9.8* 9.8*  HCT 28.7* 27.7*  MCV 95.3 94.2  PLT 101* [id number]*   Basic Metabolic Panel: Recent Labs  Lab 10/19/22 0811 10/21/22 1305  NA 127* 128*  K 4.0 4.3  CL 97* 96*  CO2 23 23  GLUCOSE 143* 121*  BUN 21 21  CREATININE 1.33* 1.51*  CALCIUM 7.9* 8.7*  MG 1.9  --    GFR: Estimated Creatinine Clearance: 47.7 mL/min (A) (by C-G formula based on SCr of 1.51 mg/dL (H)). Liver Function Tests: Recent Labs  Lab 10/19/22 0811 10/21/22 1305  AST 129* 70*  ALT 101* 68*  ALKPHOS 318* 257*  BILITOT 4.3* 2.4*  PROT 6.1* 6.1*  ALBUMIN 2.4* 2.7*   No results for input(s): "LIPASE", "AMYLASE" in the last 168  hours. No results for input(s): "AMMONIA" in the last 168 hours. Coagulation Profile: No results for input(s): "INR", "PROTIME" in the last 168 hours. Cardiac Enzymes: No results for input(s): "CKTOTAL", "CKMB", "CKMBINDEX", "TROPONINI" in the last 168 hours. BNP (last 3 results) No results for input(s): "PROBNP" in the last 8760 hours. HbA1C: No results for input(s): "HGBA1C" in the last 72 hours. CBG: Recent Labs  Lab 10/22/22 0118 10/22/22 0756  GLUCAP 152* 120*   Lipid Profile: No results for input(s): "CHOL", "HDL", "LDLCALC", "TRIG", "CHOLHDL", "LDLDIRECT" in the last 72 hours. Thyroid Function Tests: No results for input(s): "TSH", "T4TOTAL", "FREET4", "T3FREE", "THYROIDAB" in the last 72 hours. Anemia Panel: No results for input(s): "VITAMINB12", "FOLATE", "FERRITIN", "TIBC", "IRON", "RETICCTPCT" in the last 72 hours. Sepsis Labs: Recent Labs  Lab  10/21/22 1305 10/21/22 2205  LATICACIDVEN 1.5 1.7    No results found for this or any previous visit (from the past 240 hour(s)).   Radiology Studies: No results found.  Scheduled Meds:  alfuzosin  10 mg Oral QHS   carvedilol  6.25 mg Oral BID   Chlorhexidine Gluconate Cloth  6 each Topical Q0600   insulin aspart  0-9 Units Subcutaneous TID WC   lisinopril  2.5 mg Oral Daily   pantoprazole  40 mg Oral Daily   Continuous Infusions:   LOS: 1 day   Darliss Cheney, MD [facility]  10/22/2022, 10:18 AM   *Please note that this is a verbal dictation therefore any spelling or grammatical errors are due to the "[place] [facility]" system interpretation.  Please page via [place] and do not message via secure chat for urgent patient care matters. Secure chat can be used for non urgent patient care matters.  How to contact the [facility] Attending or Consulting provider [place] or covering provider during after hours [place], for this patient?  Check the care team in [facility] and look for a) attending/consulting [facility] provider listed and b) the [facility] team listed. Page or secure chat [phone]. Log into [email or web address] and use [place]'s universal password to access. If you do not have the password, please contact the hospital operator. Locate the [facility] provider you are looking for under [facility] and page to a number that you can be directly reached. If you still have difficulty reaching the provider, please page the [facility] (Director on Call) for the Hospitalists listed on amion for assistance.

## 2022-10-22 NOTE — Plan of Care (Signed)
Patient Jonathan Keith, VSS throughout shift.  Diminished lungs, IS encouraged.  All meds given on time as ordered.  Denied pain and SOB.  Voided in urinal.  All admission documentation done.  Pt's wife remains at bedside.  POC maintained, will continue to monitor.  Problem: Education: Goal: Ability to describe self-care measures that may prevent or decrease complications (Diabetes Survival Skills Education) will improve Outcome: Progressing Goal: Individualized Educational Video(s) Outcome: Progressing   Problem: Coping: Goal: Ability to adjust to condition or change in health will improve Outcome: Progressing   Problem: Fluid Volume: Goal: Ability to maintain a balanced intake and output will improve Outcome: Progressing   Problem: Health Behavior/Discharge Planning: Goal: Ability to identify and utilize available resources and services will improve Outcome: Progressing Goal: Ability to manage health-related needs will improve Outcome: Progressing   Problem: Metabolic: Goal: Ability to maintain appropriate glucose levels will improve Outcome: Progressing   Problem: Nutritional: Goal: Maintenance of adequate nutrition will improve Outcome: Progressing Goal: Progress toward achieving an optimal weight will improve Outcome: Progressing   Problem: Skin Integrity: Goal: Risk for impaired skin integrity will decrease Outcome: Progressing   Problem: Tissue Perfusion: Goal: Adequacy of tissue perfusion will improve Outcome: Progressing   Problem: Education: Goal: Knowledge of General Education information will improve Description: Including pain rating scale, medication(s)/side effects and non-pharmacologic comfort measures Outcome: Progressing   Problem: Health Behavior/Discharge Planning: Goal: Ability to manage health-related needs will improve Outcome: Progressing   Problem: Clinical Measurements: Goal: Ability to maintain clinical measurements within normal limits will  improve Outcome: Progressing Goal: Will remain free from infection Outcome: Progressing Goal: Diagnostic test results will improve Outcome: Progressing Goal: Respiratory complications will improve Outcome: Progressing Goal: Cardiovascular complication will be avoided Outcome: Progressing   Problem: Activity: Goal: Risk for activity intolerance will decrease Outcome: Progressing   Problem: Nutrition: Goal: Adequate nutrition will be maintained Outcome: Progressing   Problem: Coping: Goal: Level of anxiety will decrease Outcome: Progressing   Problem: Elimination: Goal: Will not experience complications related to bowel motility Outcome: Progressing Goal: Will not experience complications related to urinary retention Outcome: Progressing   Problem: Pain Managment: Goal: General experience of comfort will improve Outcome: Progressing   Problem: Safety: Goal: Ability to remain free from injury will improve Outcome: Progressing   Problem: Skin Integrity: Goal: Risk for impaired skin integrity will decrease Outcome: Progressing

## 2022-10-22 NOTE — H&P (View-Only) (Signed)
 Referring Provider:  ? Primary Care Physician:  Fusco, Lawrence, MD Primary Gastroenterologist:  Dr. Rehman, Dr. Castaneda  Reason for Consultation:  Biliary obstruction with cholangiocarcinoma  HPI: Jonathan Keith is a 66 y.o. male with complicated history of HTN, DM, Cholangiocarcinoma s/p biliary stenting and portal vein embolization at [facility] in [place] in Nov 2022, complicated by recurrent Pseudomonas bacteremia and Enterococcus bacteremia requiring IV abx and exchange of biliary stents at [facility] on 05/27/21.  [facility] in November 2023 and was told that they could not proceed with hepatectomy as his cholangiocarcinoma had progressed too much for surgical intervention, wife reports they did change out his biliary stent to a metal stent at that time.  Dr. Mansouraty was contacted by patient's oncologist, Dr. Katragada, about recent increasing LFTs/total bili.  Plan was for the patient to come to the ED at [facility] for ERCP to see what further stenting/intervention could be done.  MRI abdomen/MRCP:   IMPRESSION: 1. Moderate intrahepatic biliary ductal dilatation, similar in appearance to prior examination. No directly visualized mass. Common bile duct stent, although poorly visualized by MRI is in unchanged positioned within the central common bile duct. 2. Small volume ascites similar to prior examination. 3. Single large gallstone contracted in the central gallbladder near the gallbladder neck. 4. Severe aortic atherosclerosis.   Aortic Atherosclerosis (ICD10-I70.0).  LFTs actually downtrending again today with total bilirubin 2.4, alk phos 257, ALT 68, AST 70.  Other PMH includes CAD with ischemic cardiomyopathy (EF 45-50%), recent LV thrombus s/p treatment with Coumadin (off of this now), CKD stage IIIa, anemia of chronic disease, HLD.  Past Medical History:  Diagnosis Date   Cancer (HCC)    CHF (congestive heart failure) (HCC)    Chronic pain     neck, back, knees   Class 1 obesity due to excess calories with body mass index (BMI) of 31.0 to 31.9 in adult    Claudication (HCC)    Coronary artery disease    DDD (degenerative disc disease), cervical    Diabetes mellitus (HCC)    Dyslipidemia    Hypertension    Port-A-Cath in place 07/20/2021   Tobacco dependence     Past Surgical History:  Procedure Laterality Date   BILIARY BRUSHING N/A 02/25/2021   Procedure: BILIARY BRUSHING;  Surgeon: Rehman, Najeeb U, MD;  Location: [facility];  Service: Gastroenterology;  Laterality: N/A;   BILIARY STENT PLACEMENT N/A 02/25/2021   Procedure: BILIARY STENT PLACEMENT;  Surgeon: Rehman, Najeeb U, MD;  Location: [facility];  Service: Gastroenterology;  Laterality: N/A;   BILIARY STENT PLACEMENT  02/27/2021   Procedure: BILIARY STENT PLACEMENT (10FR x 9cm) IN THE RIGHT SYSTEM;  Surgeon: Rehman, Najeeb U, MD;  Location: [facility];  Service: Gastroenterology;;   BREAST SURGERY Left    benign lump- in his 20s.   CARDIAC CATHETERIZATION     ERCP N/A 02/25/2021   Procedure: ENDOSCOPIC RETROGRADE CHOLANGIOPANCREATOGRAPHY (ERCP);  Surgeon: Rehman, Najeeb U, MD;  Location: [facility];  Service: Gastroenterology;  Laterality: N/A;   ERCP N/A 02/27/2021   Procedure: ENDOSCOPIC RETROGRADE CHOLANGIOPANCREATOGRAPHY (ERCP);  Surgeon: Rehman, Najeeb U, MD;  Location: [facility];  Service: Gastroenterology;  Laterality: N/A;   IR IMAGING GUIDED PORT INSERTION  07/21/2021   IR REMOVAL TUN ACCESS W/ PORT W/O FL MOD SED  09/30/2022   KNEE SURGERY Left    x2   LEFT HEART CATH AND CORONARY ANGIOGRAPHY N/A 09/08/2021   Procedure: LEFT HEART CATH AND CORONARY   ANGIOGRAPHY;  Surgeon: Thukkani, Arun K, MD;  Location: [facility];  Service: Cardiovascular;  Laterality: N/A;   SPHINCTEROTOMY N/A 02/25/2021   Procedure: SPHINCTEROTOMY;  Surgeon: Rehman, Najeeb U, MD;  Location: [facility];  Service: Gastroenterology;  Laterality: N/A;   STENT REMOVAL  02/27/2021   Procedure: STENT  REMOVAL (8.5Fr x 9cm);  Surgeon: Rehman, Najeeb U, MD;  Location: [facility];  Service: Gastroenterology;;    Prior to Admission medications   Medication Sig Start Date End Date Taking? Authorizing Provider  acetaminophen (TYLENOL) 500 MG tablet Take 500 mg by mouth every 6 (six) hours as needed for moderate pain or headache.   Yes [provider]  atorvastatin (LIPITOR) 40 MG tablet Take 1 tablet (40 mg total) by mouth daily at 6 PM. 12/05/21  Yes O'Neal, [place] Thomas, MD  carvedilol (COREG) 6.25 MG tablet Take 1 tablet (6.25 mg total) by mouth 2 (two) times daily. 06/12/22  Yes O'Neal, [place] Thomas, MD  famotidine (PEPCID) 20 MG tablet Take 20 mg by mouth 2 (two) times daily.   Yes [provider]  ferrous sulfate (FERROUSUL) 325 (65 FE) MG tablet Take 1 tablet (325 mg total) by mouth daily with breakfast. 03/18/21  Yes Rehman, Najeeb U, MD  furosemide (LASIX) 20 MG tablet Take 20 mg by mouth. If he gains weight five pounds when taking chemo, take one tab a day till he is back to normal weight or within 2 pounds for fluid check before surgery.   Yes [provider]  lidocaine-prilocaine (EMLA) cream  05/27/22  Yes [provider]  lisinopril (ZESTRIL) 2.5 MG tablet Take 1 tablet (2.5 mg total) by mouth daily. 09/11/22 09/06/23 Yes Mallipeddi, Vishnu P, MD  metFORMIN (GLUCOPHAGE) 500 MG tablet Take 500 mg by mouth 2 (two) times daily with a meal.   Yes [provider]  pantoprazole (PROTONIX) 40 MG tablet Take 1 tablet (40 mg total) by mouth daily. 12/05/21  Yes O'Neal, [place] Thomas, MD  prochlorperazine (COMPAZINE) 10 MG tablet TAKE (1) TABLET BY MOUTH EVERY (6) HOURS AS NEEDED. 09/28/22  Yes Katragadda, Sreedhar, MD  sodium chloride flush (NS) 0.9 % SOLN 10 mLs by Intracatheter route every 7 (seven) days. 10/20/22  Yes Katragadda, Sreedhar, MD  tamsulosin (FLOMAX) 0.4 MG CAPS capsule TAKE (2) CAPSULES BY MOUTH AT BEDTIME. 05/08/22  Yes Katragadda, Sreedhar, MD   Vitamin D, Ergocalciferol, (DRISDOL) 1.25 MG (50000 UNIT) CAPS capsule Take 1 capsule by mouth once a week. 06/29/22  Yes [provider]  warfarin (COUMADIN) 5 MG tablet Take 5 mg by mouth daily at 4 PM.   Yes [provider]  albuterol (VENTOLIN HFA) 108 (90 Base) MCG/ACT inhaler Inhale 2 puffs into the lungs every 6 (six) hours as needed for wheezing or shortness of breath. 11/29/21   Tat, David, MD  alfuzosin (UROXATRAL) 10 MG 24 hr tablet Take 1 tablet (10 mg total) by mouth at bedtime. 08/21/22   McKenzie, Patrick L, MD  CISPLATIN IV Inject into the vein once a week. Days 1, 8 every 21 days 07/22/21   [provider]  Durvalumab (IMFINZI IV) Inject into the vein every 21 ( twenty-one) days. 07/22/21   [provider]  Gemcitabine HCl (GEMZAR IV) Inject into the vein once a week. Days 1, 8 every 21 days 07/22/21   [provider]  Ginseng 100 MG CAPS Take 1 capsule by mouth daily.    [provider]  loratadine (CLARITIN) 10 MG tablet   Take 10 mg by mouth daily. Takes for a few days after shot to boost WBC. About every 2nd treatment    [provider]  metFORMIN (GLUCOPHAGE) 500 MG tablet Take 1 tablet (500 mg total) by mouth 2 (two) times daily with a meal. 05/29/21 01/13/22  Choi, Jennifer, DO  Misc. Devices KIT 1 Box by Does not apply route every 7 (seven) days. 10/20/22   Katragadda, Sreedhar, MD  nitroGLYCERIN (NITROSTAT) 0.4 MG SL tablet Place 1 tablet (0.4 mg total) under the tongue every 5 (five) minutes as needed for chest pain. 09/12/21 09/12/22  Duke, Angela Nicole, PA  Wound Dressings (INTRASITE GEL APPLIPAK) GEL Apply 1 Units topically daily. 10/05/22   Katragadda, Sreedhar, MD    Current Facility-Administered Medications  Medication Dose Route Frequency Provider Last Rate Last Admin   alfuzosin (UROXATRAL) 24 hr tablet 10 mg  10 mg Oral QHS Patel, Vishal R, MD   10 mg at 10/22/22 0100   carvedilol (COREG) tablet 6.25 mg  6.25  mg Oral BID Patel, Vishal R, MD   6.25 mg at 10/22/22 0057   Chlorhexidine Gluconate Cloth 2 % PADS 6 each  6 each Topical Q0600 Patel, Vishal R, MD   6 each at 10/22/22 0538   insulin aspart (novoLOG) injection 0-9 Units  0-9 Units Subcutaneous TID WC Patel, Vishal R, MD       lisinopril (ZESTRIL) tablet 2.5 mg  2.5 mg Oral Daily Patel, Vishal R, MD       ondansetron (ZOFRAN) tablet 4 mg  4 mg Oral Q6H PRN Patel, Vishal R, MD       Or   ondansetron (ZOFRAN) injection 4 mg  4 mg Intravenous Q6H PRN Patel, Vishal R, MD       pantoprazole (PROTONIX) EC tablet 40 mg  40 mg Oral Daily Patel, Vishal R, MD       senna-docusate (Senokot-S) tablet 1 tablet  1 tablet Oral QHS PRN Patel, Vishal R, MD        Allergies as of 10/21/2022   (No Known Allergies)    Family History  Problem Relation Age of Onset   CAD Mother    Cancer Neg Hx     Social History   Socioeconomic History   Marital status: Married    Spouse name: Not on file   Number of children: Not on file   Years of education: Not on file   Highest education level: Not on file  Occupational History   Occupation: Heavy equipment operator  Tobacco Use   Smoking status: Every Day    Packs/day: 0.50    Years: 56.00    Additional pack years: 0.00    Total pack years: 28.00    Types: Cigarettes   Smokeless tobacco: Never  Vaping Use   Vaping Use: Never used  Substance and Sexual Activity   Alcohol use: Not Currently    Comment: stopped 2.5 years ago 03/11/21   Drug use: Never   Sexual activity: Not on file  Other Topics Concern   Not on file  Social History Narrative   Not on file   Social Determinants of Health   Financial Resource Strain: Medium Risk (03/07/2021)   Overall Financial Resource Strain (CARDIA)    Difficulty of Paying Living Expenses: Somewhat hard  Food Insecurity: No Food Insecurity (10/22/2022)   Hunger Vital Sign    Worried About Running Out of Food in the Last Year: Never true    Ran Out of Food   in  the Last Year: Never true  Transportation Needs: No Transportation Needs (10/22/2022)   PRAPARE - Transportation    Lack of Transportation (Medical): No    Lack of Transportation (Non-Medical): No  Physical Activity: Insufficiently Active (03/07/2021)   Exercise Vital Sign    Days of Exercise per Week: 2 days    Minutes of Exercise per Session: 20 min  Stress: No Stress Concern Present (03/07/2021)   [employer] - Occupational Stress Questionnaire    Feeling of Stress : Only a little  Social Connections: Moderately Integrated (03/07/2021)   Social Connection and Isolation Panel [NHANES]    Frequency of Communication with Friends and Family: More than three times a week    Frequency of Social Gatherings with Friends and Family: More than three times a week    Attends Religious Services: More than 4 times per year    Active Member of Clubs or Organizations: No    Attends Club or Organization Meetings: Never    Marital Status: Married  Intimate Partner Violence: Not At Risk (10/22/2022)   Humiliation, Afraid, Rape, and Kick questionnaire    Fear of Current or Ex-Partner: No    Emotionally Abused: No    Physically Abused: No    Sexually Abused: No    Review of Systems: ROS is O/W negative except as mentioned in HPI.  Physical Exam: Vital signs in last 24 hours: Temp:  [97.8 F (36.6 C)-98.4 F (36.9 C)] 98.4 F (36.9 C) (03/14 0748) Pulse Rate:  [79-92] 81 (03/14 0748) Resp:  [14-18] 14 (03/14 0748) BP: (96-127)/(60-76) 98/61 (03/14 0748) SpO2:  [96 %-100 %] 97 % (03/14 0748) Weight:  [70.1 kg-73 kg] 70.1 kg (03/14 0156) Last BM Date :  (PTA) General:  Alert, Well-developed, well-nourished, pleasant and cooperative in NAD Head:  Normocephalic and atraumatic. Eyes:  Scleral icterus noted. Ears:  Normal auditory acuity. Mouth:  No deformity or lesions.   Lungs:  Clear throughout to auscultation.  No wheezes, crackles, or rhonchi.  Heart:  Regular  rate and rhythm; no murmurs, clicks, rubs, or gallops. Abdomen:  Soft, non-distended.  BS present.  Non-tender.    Msk:  Symmetrical without gross deformities. Pulses:  Normal pulses noted. Extremities:  Without clubbing or edema. Neurologic:  Alert and oriented x 4;  grossly normal neurologically. Skin:  Intact without significant lesions or rashes. Psych:  Alert and cooperative. Normal mood and affect.  Intake/Output from previous day: 03/13 0701 - 03/14 0700 In: -  Out: 100 [Urine:100]  Lab Results: Recent Labs    10/21/22 1305  WBC 4.9  HGB 9.8*  HCT 27.7*  PLT 133*   BMET Recent Labs    10/21/22 1305  NA 128*  K 4.3  CL 96*  CO2 23  GLUCOSE 121*  BUN 21  CREATININE 1.51*  CALCIUM 8.7*   LFT Recent Labs    10/21/22 1305  PROT 6.1*  ALBUMIN 2.7*  AST 70*  ALT 68*  ALKPHOS 257*  BILITOT 2.4*   IMPRESSION:  *67 year old with past medical history complicated by cholangiocarcinoma with multiple biliary stent replacements r/t cholangitis with Psuedomonoas and Enterococcus, currently on palliative Chemotherapy.   Stent placement across the right hepatic system by Dr. Rehman in 2022 then another metal stent at [facility] in 06/2022.  LFTs were up higher, but this AM back down again. *Recent LV thrombus s/p treatment with Coumadin, however has been off of that for a little while now per cardiology. *  Hyponatremia:  Na+ 128  PLAN: -ERCP with attempt at further stenting 3/15 per Dr. Mansouraty. -Correction of low Na+ per hospitalist team prior to ERCP. -Trend labs/LFTs.   Mihira Tozzi D. Kedar Sedano  10/22/2022, 9:36 AM      

## 2022-10-22 NOTE — ED Notes (Signed)
ED TO INPATIENT HANDOFF REPORT  ED Nurse Name and Phone #: Rock Nephew [phone]  S Name/Age/Gender Jonathan Keith 67 y.o. male Room/Bed: 003C/003C  Code Status   Code Status: Full Code  Home/SNF/Other Home Patient oriented to: self, place, time, and situation Is this baseline? Yes   Triage Complete: Triage complete  Chief Complaint 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 ([place]) [K83.1, C80.1]  Triage Note Pt sent d/t bile duct blockage.  Pt's wife reports the Surgeon is aware and expecting the Pt.  Pt recently had fluid removed from abdomen.  Denies pain.     Allergies No Known Allergies  Level of Care/Admitting Diagnosis ED Disposition     ED Disposition  Admit   Condition  --   Comment  Hospital Area: [facility] [[id number]]  Level of Care: Med-Surg [16]  May admit patient to [facility] or [facility] if equivalent level of care is available:: No  Covid Evaluation: Asymptomatic - no recent exposure (last 10 days) testing not required  Diagnosis: 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 [facility]) [id number]  Admitting Physician: Lenore Cordia [id number]  Attending Physician: Lenore Cordia [id number]  Certification:: I certify this patient will need inpatient services for at least 2 midnights  Estimated Length of Stay: 3          B Medical/Surgery History Past Medical History:  Diagnosis Date   Cancer ([place])    CHF (congestive heart failure) (HCC)    Chronic pain    neck, back, knees   Class 1 obesity due to excess calories with body mass index (BMI) of 31.0 to 31.9 in adult    Claudication [facility])    Coronary artery disease    DDD (degenerative disc disease), cervical    Diabetes mellitus ([place])    Dyslipidemia    Hypertension    Port-A-Cath in place 07/20/2021   Tobacco dependence    Past Surgical History:  Procedure Laterality Date   BILIARY BRUSHING N/A 02/25/2021   Procedure: BILIARY BRUSHING;  Surgeon: Rogene Houston, MD;  Location: [facility];   Service: Gastroenterology;  Laterality: N/A;   BILIARY STENT PLACEMENT N/A 02/25/2021   Procedure: BILIARY STENT PLACEMENT;  Surgeon: Rogene Houston, MD;  Location: [facility];  Service: Gastroenterology;  Laterality: N/A;   BILIARY STENT PLACEMENT  02/27/2021   Procedure: BILIARY STENT PLACEMENT (10FR x 9cm) IN THE RIGHT SYSTEM;  Surgeon: Rogene Houston, MD;  Location: [facility];  Service: Gastroenterology;;   BREAST SURGERY Left    benign lump- in his 48s.   CARDIAC CATHETERIZATION     ERCP N/A 02/25/2021   Procedure: ENDOSCOPIC RETROGRADE CHOLANGIOPANCREATOGRAPHY (ERCP);  Surgeon: Rogene Houston, MD;  Location: [facility];  Service: Gastroenterology;  Laterality: N/A;   ERCP N/A 02/27/2021   Procedure: ENDOSCOPIC RETROGRADE CHOLANGIOPANCREATOGRAPHY (ERCP);  Surgeon: Rogene Houston, MD;  Location: [facility];  Service: Gastroenterology;  Laterality: N/A;   IR IMAGING GUIDED PORT INSERTION  07/21/2021   IR REMOVAL TUN ACCESS W/ PORT W/O FL MOD SED  09/30/2022   KNEE SURGERY Left    x2   LEFT HEART CATH AND CORONARY ANGIOGRAPHY N/A 09/08/2021   Procedure: LEFT HEART CATH AND CORONARY ANGIOGRAPHY;  Surgeon: Early Osmond, MD;  Location: [place] [facility];  Service: Cardiovascular;  Laterality: N/A;   SPHINCTEROTOMY N/A 02/25/2021   Procedure: SPHINCTEROTOMY;  Surgeon: Rogene Houston, MD;  Location: [facility];  Service: Gastroenterology;  Laterality: N/A;   STENT REMOVAL  02/27/2021  Procedure: STENT REMOVAL (8.5Fr x 9cm);  Surgeon: Rogene Houston, MD;  Location: [facility];  Service: Gastroenterology;;     A IV Location/Drains/Wounds Patient Lines/Drains/Airways Status     Active Line/Drains/Airways     Name Placement date Placement time Site Days   PICC Single Lumen 09/30/22 Right Brachial 37 cm 09/30/22  1445  Brachial  22   GI Stent 10 Fr. 02/27/21  1326  --  602   Wound / Incision (Open or Dehisced) 11/28/21 Skin tear Arm Lower;Posterior;Proximal;Right 11/28/21  1101  Arm  328   Wound  / Incision (Open or Dehisced) 09/30/22 Skin tear;(MARSI) Medical Adhesive-Related Skin Injury Arm Anterior;Right;Upper small skin tear with skin 09/30/22  1514  Arm  22   Wound / Incision (Open or Dehisced) 10/01/22 Incision - Open Chest Right;Anterior;Upper 10/01/22  1440  Chest  21            Intake/Output Last 24 hours No intake or output data in the 24 hours ending 10/22/22 0046  Labs/Imaging Results for orders placed or performed during the hospital encounter of 10/21/22 (from the past 48 hour(s))  CBC with Differential     Status: Abnormal   Collection Time: 10/21/22  1:05 PM  Result Value Ref Range   WBC 4.9 4.0 - 10.5 K/uL   RBC 2.94 (L) 4.22 - 5.81 MIL/uL   Hemoglobin 9.8 (L) 13.0 - 17.0 g/dL   HCT 27.7 (L) 39.0 - 52.0 %   MCV 94.2 80.0 - 100.0 fL   MCH 33.3 26.0 - 34.0 pg   MCHC 35.4 30.0 - 36.0 g/dL   RDW 14.3 11.5 - 15.5 %   Platelets 133 (L) 150 - 400 K/uL   nRBC 0.0 0.0 - 0.2 %   Neutrophils Relative % 56 %   Neutro Abs 2.8 1.7 - 7.7 K/uL   Lymphocytes Relative 19 %   Lymphs Abs 0.9 0.7 - 4.0 K/uL   Monocytes Relative 21 %   Monocytes Absolute 1.0 0.1 - 1.0 K/uL   Eosinophils Relative 3 %   Eosinophils Absolute 0.1 0.0 - 0.5 K/uL   Basophils Relative 1 %   Basophils Absolute 0.0 0.0 - 0.1 K/uL   Immature Granulocytes 0 %   Abs Immature Granulocytes 0.02 0.00 - 0.07 K/uL    Comment: Performed at [facility], [address] [place],  [address]  Comprehensive metabolic panel     Status: Abnormal   Collection Time: 10/21/22  1:05 PM  Result Value Ref Range   Sodium 128 (L) 135 - 145 mmol/L   Potassium 4.3 3.5 - 5.1 mmol/L   Chloride 96 (L) 98 - 111 mmol/L   CO2 23 22 - 32 mmol/L   Glucose, Bld 121 (H) 70 - 99 mg/dL    Comment: Glucose reference range applies only to samples taken after fasting for at least 8 hours.   BUN 21 8 - 23 mg/dL   Creatinine, Ser 1.51 (H) 0.61 - 1.24 mg/dL   Calcium 8.7 (L) 8.9 - 10.3 mg/dL   Total Protein 6.1 (L)  6.5 - 8.1 g/dL   Albumin 2.7 (L) 3.5 - 5.0 g/dL   AST 70 (H) 15 - 41 U/L   ALT 68 (H) 0 - 44 U/L   Alkaline Phosphatase 257 (H) 38 - 126 U/L   Total Bilirubin 2.4 (H) 0.3 - 1.2 mg/dL   GFR, Estimated 51 (L) >60 mL/min    Comment: (NOTE) Calculated using the CKD-EPI Creatinine Equation (2021)  Anion gap 9 5 - 15    Comment: Performed at [facility] [address] [place], [place] [address]  Lactic acid, plasma     Status: None   Collection Time: 10/21/22  1:05 PM  Result Value Ref Range   Lactic Acid, Venous 1.5 0.5 - 1.9 mmol/L    Comment: Performed at [facility] [address], [place], [place] [address]  Lactic acid, plasma     Status: None   Collection Time: 10/21/22 10:05 PM  Result Value Ref Range   Lactic Acid, Venous 1.7 0.5 - 1.9 mmol/L    Comment: Performed at [facility] [address] [place], [place] [address]   No results found.  Pending Labs Unresulted Labs (From admission, onward)     Start     Ordered   10/22/22 0500  HIV Antibody (routine testing w rflx)  (HIV Antibody (Routine testing w reflex) panel)  Tomorrow morning,   R        10/22/22 0025   10/22/22 0500  Comprehensive metabolic panel  Tomorrow morning,   R        10/22/22 0025   10/22/22 0500  CBC  Tomorrow morning,   R        10/22/22 0025   10/22/22 0500  Protime-INR  Tomorrow morning,   R        10/22/22 0025   10/21/22 2248  Cancer antigen 19-9  Once,   URGENT        10/21/22 2247            Vitals/Pain Today's Vitals   10/21/22 2300 10/21/22 2330 10/22/22 0000 10/22/22 0030  BP: 107/67 110/76 119/68 117/72  Pulse: 82 91 92 85  Resp: '16 14 15 18  '$ Temp:      TempSrc:      SpO2: 96% 98% 97% 97%  Weight:      Height:      PainSc:        Isolation Precautions No active isolations  Medications Medications  ondansetron (ZOFRAN) tablet 4 mg (has no administration in time range)    Or  ondansetron (ZOFRAN) injection 4 mg (has no administration in time  range)  senna-docusate (Senokot-S) tablet 1 tablet (has no administration in time range)  carvedilol (COREG) tablet 6.25 mg (has no administration in time range)  lisinopril (ZESTRIL) tablet 2.5 mg (has no administration in time range)  pantoprazole (PROTONIX) EC tablet 40 mg (has no administration in time range)  alfuzosin (UROXATRAL) 24 hr tablet 10 mg (has no administration in time range)  insulin aspart (novoLOG) injection 0-9 Units (has no administration in time range)    Mobility walks     Focused Assessments Bile duct blockage.    R Recommendations: See Admitting Provider Note  Report given to:   Additional Notes: Patient here reference bile duct blockage. He has a patent PICC in right arm. Wife at bedside.

## 2022-10-23 ENCOUNTER — Inpatient Hospital Stay (HOSPITAL_COMMUNITY): Payer: Medicare Other | Admitting: Anesthesiology

## 2022-10-23 ENCOUNTER — Inpatient Hospital Stay (HOSPITAL_COMMUNITY): Payer: Medicare Other

## 2022-10-23 ENCOUNTER — Encounter (HOSPITAL_COMMUNITY)
Admission: EM | Disposition: A | Discharge status: Home / Self Care | Payer: Self-pay | Source: Home / Self Care | Attending: Family Medicine

## 2022-10-23 ENCOUNTER — Encounter (HOSPITAL_COMMUNITY): Payer: Self-pay | Admitting: Internal Medicine

## 2022-10-23 DIAGNOSIS — K831 Obstruction of bile duct: Secondary | ICD-10-CM | POA: Diagnosis not present

## 2022-10-23 DIAGNOSIS — K838 Other specified diseases of biliary tract: Secondary | ICD-10-CM

## 2022-10-23 DIAGNOSIS — E1151 Type 2 diabetes mellitus with diabetic peripheral angiopathy without gangrene: Secondary | ICD-10-CM

## 2022-10-23 DIAGNOSIS — Z7984 Long term (current) use of oral hypoglycemic drugs: Secondary | ICD-10-CM

## 2022-10-23 DIAGNOSIS — C240 Malignant neoplasm of extrahepatic bile duct: Secondary | ICD-10-CM

## 2022-10-23 DIAGNOSIS — D630 Anemia in neoplastic disease: Secondary | ICD-10-CM

## 2022-10-23 DIAGNOSIS — C221 Intrahepatic bile duct carcinoma: Secondary | ICD-10-CM

## 2022-10-23 DIAGNOSIS — C801 Malignant (primary) neoplasm, unspecified: Secondary | ICD-10-CM | POA: Diagnosis not present

## 2022-10-23 HISTORY — PX: REMOVAL OF STONES: SHX5545

## 2022-10-23 HISTORY — PX: BILIARY DILATION: SHX6850

## 2022-10-23 HISTORY — PX: ERCP: SHX5425

## 2022-10-23 HISTORY — PX: BILIARY STENT PLACEMENT: SHX5538

## 2022-10-23 LAB — GLUCOSE, CAPILLARY
Glucose-Capillary: 117 mg/dL — ABNORMAL HIGH (ref 70–99)
Glucose-Capillary: 135 mg/dL — ABNORMAL HIGH (ref 70–99)
Glucose-Capillary: 156 mg/dL — ABNORMAL HIGH (ref 70–99)
Glucose-Capillary: 168 mg/dL — ABNORMAL HIGH (ref 70–99)

## 2022-10-23 LAB — CBC WITH DIFFERENTIAL/PLATELET
Abs Immature Granulocytes: 0.01 10*3/uL (ref 0.00–0.07)
Basophils Absolute: 0 10*3/uL (ref 0.0–0.1)
Basophils Relative: 1 %
Eosinophils Absolute: 0.2 10*3/uL (ref 0.0–0.5)
Eosinophils Relative: 4 %
HCT: 27.5 % — ABNORMAL LOW (ref 39.0–52.0)
Hemoglobin: 9.2 g/dL — ABNORMAL LOW (ref 13.0–17.0)
Immature Granulocytes: 0 %
Lymphocytes Relative: 19 %
Lymphs Abs: 0.8 10*3/uL (ref 0.7–4.0)
MCH: 32.6 pg (ref 26.0–34.0)
MCHC: 33.5 g/dL (ref 30.0–36.0)
MCV: 97.5 fL (ref 80.0–100.0)
Monocytes Absolute: 1 10*3/uL (ref 0.1–1.0)
Monocytes Relative: 23 %
Neutro Abs: 2.3 10*3/uL (ref 1.7–7.7)
Neutrophils Relative %: 53 %
Platelets: 136 10*3/uL — ABNORMAL LOW (ref 150–400)
RBC: 2.82 MIL/uL — ABNORMAL LOW (ref 4.22–5.81)
RDW: 14.6 % (ref 11.5–15.5)
WBC: 4.3 10*3/uL (ref 4.0–10.5)
nRBC: 0 % (ref 0.0–0.2)

## 2022-10-23 LAB — COMPREHENSIVE METABOLIC PANEL
ALT: 66 U/L — ABNORMAL HIGH (ref 0–44)
AST: 75 U/L — ABNORMAL HIGH (ref 15–41)
Albumin: 2.3 g/dL — ABNORMAL LOW (ref 3.5–5.0)
Alkaline Phosphatase: 255 U/L — ABNORMAL HIGH (ref 38–126)
Anion gap: 6 (ref 5–15)
BUN: 19 mg/dL (ref 8–23)
CO2: 23 mmol/L (ref 22–32)
Calcium: 8.3 mg/dL — ABNORMAL LOW (ref 8.9–10.3)
Chloride: 104 mmol/L (ref 98–111)
Creatinine, Ser: 1.36 mg/dL — ABNORMAL HIGH (ref 0.61–1.24)
GFR, Estimated: 57 mL/min — ABNORMAL LOW (ref >60)
Glucose, Bld: 109 mg/dL — ABNORMAL HIGH (ref 70–99)
Potassium: 4.2 mmol/L (ref 3.5–5.1)
Sodium: 133 mmol/L — ABNORMAL LOW (ref 135–145)
Total Bilirubin: 2.3 mg/dL — ABNORMAL HIGH (ref 0.3–1.2)
Total Protein: 5.6 g/dL — ABNORMAL LOW (ref 6.5–8.1)

## 2022-10-23 LAB — CANCER ANTIGEN 19-9: CA 19-9: 792 U/mL — ABNORMAL HIGH (ref 0–35)

## 2022-10-23 SURGERY — ERCP, WITH INTERVENTION IF INDICATED
Anesthesia: General

## 2022-10-23 MED ORDER — ROCURONIUM BROMIDE 10 MG/ML (PF) SYRINGE
PREFILLED_SYRINGE | INTRAVENOUS | Status: DC | PRN
  Administered 2022-10-23: 50 mg via INTRAVENOUS

## 2022-10-23 MED ORDER — GLUCAGON HCL RDNA (DIAGNOSTIC) 1 MG IJ SOLR
INTRAMUSCULAR | Status: AC
  Filled 2022-10-23: qty 1

## 2022-10-23 MED ORDER — PROPOFOL 10 MG/ML IV BOLUS
INTRAVENOUS | Status: DC | PRN
  Administered 2022-10-23: 110 mg via INTRAVENOUS

## 2022-10-23 MED ORDER — SUGAMMADEX SODIUM 200 MG/2ML IV SOLN
INTRAVENOUS | Status: DC | PRN
  Administered 2022-10-23: 200 mg via INTRAVENOUS

## 2022-10-23 MED ORDER — GLUCAGON HCL RDNA (DIAGNOSTIC) 1 MG IJ SOLR
INTRAMUSCULAR | Status: DC | PRN
  Administered 2022-10-23 (×3): .25 mg via INTRAVENOUS

## 2022-10-23 MED ORDER — INDOMETHACIN 50 MG RE SUPP
RECTAL | Status: AC
  Filled 2022-10-23: qty 2

## 2022-10-23 MED ORDER — INDOMETHACIN 50 MG RE SUPP
RECTAL | Status: DC | PRN
  Administered 2022-10-23: 100 mg via RECTAL

## 2022-10-23 MED ORDER — LACTATED RINGERS IV SOLN
INTRAVENOUS | Status: AC | PRN
  Administered 2022-10-23: 1000 mL via INTRAVENOUS

## 2022-10-23 MED ORDER — SODIUM CHLORIDE 0.9 % IV BOLUS
500.0000 mL | Freq: Once | INTRAVENOUS | Status: AC
  Administered 2022-10-23: 500 mL via INTRAVENOUS

## 2022-10-23 MED ORDER — ONDANSETRON HCL 4 MG/2ML IJ SOLN
INTRAMUSCULAR | Status: DC | PRN
  Administered 2022-10-23: 4 mg via INTRAVENOUS

## 2022-10-23 MED ORDER — CIPROFLOXACIN IN D5W 400 MG/200ML IV SOLN
INTRAVENOUS | Status: DC | PRN
  Administered 2022-10-23: 400 mg via INTRAVENOUS

## 2022-10-23 MED ORDER — CARVEDILOL 3.125 MG PO TABS
3.1250 mg | ORAL_TABLET | Freq: Two times a day (BID) | ORAL | Status: DC
  Administered 2022-10-23 – 2022-10-25 (×2): 3.125 mg via ORAL
  Filled 2022-10-23 (×2): qty 1

## 2022-10-23 MED ORDER — FENTANYL CITRATE (PF) 250 MCG/5ML IJ SOLN
INTRAMUSCULAR | Status: DC | PRN
  Administered 2022-10-23 (×2): 50 ug via INTRAVENOUS

## 2022-10-23 MED ORDER — CIPROFLOXACIN HCL 500 MG PO TABS: 500.0000 mg | ORAL_TABLET | Freq: Two times a day (BID) | ORAL | Status: DC

## 2022-10-23 MED ORDER — LIDOCAINE 2% (20 MG/ML) 5 ML SYRINGE
INTRAMUSCULAR | Status: DC | PRN
  Administered 2022-10-23: 40 mg via INTRAVENOUS

## 2022-10-23 MED ORDER — SODIUM CHLORIDE 0.9 % IV SOLN
INTRAVENOUS | Status: DC | PRN
  Administered 2022-10-23: 30 mL

## 2022-10-23 MED ORDER — CIPROFLOXACIN IN D5W 400 MG/200ML IV SOLN
INTRAVENOUS | Status: AC
  Filled 2022-10-23: qty 200

## 2022-10-23 MED ORDER — FENTANYL CITRATE (PF) 100 MCG/2ML IJ SOLN
INTRAMUSCULAR | Status: AC
  Filled 2022-10-23: qty 2

## 2022-10-23 MED ORDER — CIPROFLOXACIN HCL 500 MG PO TABS
500.0000 mg | ORAL_TABLET | Freq: Two times a day (BID) | ORAL | Status: DC
  Administered 2022-10-23 – 2022-10-25 (×4): 500 mg via ORAL
  Filled 2022-10-23 (×4): qty 1

## 2022-10-23 MED ORDER — PHENYLEPHRINE HCL-NACL 20-0.9 MG/250ML-% IV SOLN
INTRAVENOUS | Status: DC | PRN
  Administered 2022-10-23: 50 ug/min via INTRAVENOUS

## 2022-10-23 MED ORDER — SODIUM CHLORIDE 0.9 % IV BOLUS
250.0000 mL | Freq: Once | INTRAVENOUS | Status: AC
  Administered 2022-10-23: 250 mL via INTRAVENOUS

## 2022-10-23 MED ORDER — SODIUM CHLORIDE 0.9 % IV SOLN: INTRAVENOUS | Status: DC

## 2022-10-23 NOTE — Anesthesia Procedure Notes (Signed)
Procedure Name: Intubation Date/Time: 10/23/2022 9:43 AM  Performed by: Terrence Dupont, CRNAPre-anesthesia Checklist: Patient identified, Emergency Drugs available, Suction available and Patient being monitored Patient Re-evaluated:Patient Re-evaluated prior to induction Oxygen Delivery Method: Circle system utilized Preoxygenation: Pre-oxygenation with 100% oxygen Induction Type: IV induction Ventilation: Mask ventilation without difficulty Laryngoscope Size: Mac and 4 Grade View: Grade II Tube type: Oral Tube size: 7.5 mm Number of attempts: 1 Airway Equipment and Method: Stylet and Oral airway Placement Confirmation: ETT inserted through vocal cords under direct vision, positive ETCO2 and breath sounds checked- equal and bilateral Secured at: 22 cm Tube secured with: Tape Dental Injury: Teeth and Oropharynx as per pre-operative assessment

## 2022-10-23 NOTE — TOC Initial Note (Signed)
Transition of Care [facility]) - Initial/Assessment Note    Patient Details  Name: Jonathan Keith MRN: [id number] Date of Birth: [birth year]  Transition of [facility]) CM/SW Contact:    Curlene Labrum, RN Phone Number: 10/23/2022, 2:36 PM  Clinical Narrative:                 CM met with the patient and spouse at the bedside to discuss TOC needs.  The patient admitted to the hospital with a hepatobiliary blockage  and returned from GI procedure today to have biliary stent replaced.  The patient's wife hopes patient will discharge home this afternoon or in the morning after the procedure.  The patient has PICC line intact to right arm that is present to receive IV chemotherapy at the cancer center in [place], [place].  The patient has old right chest port that become infected and was removed in the past - tegaderm dressing noted to right chest.    The patient is active with home health RN through [facility].  I placed renewed home health order to have attending co-sign.  The patient's family will provide transportation to home when medically ready for discharge today or tomorrow.  Expected Discharge Plan: [place] Barriers to Discharge: Continued Medical Work up   Patient Goals and CMS Choice Patient states their goals for this hospitalization and ongoing recovery are:: To get better and go home with family CMS Medicare.gov Compare Post Acute Care list provided to:: Patient Represenative (must comment) (wife present at bedside) Choice offered to / list presented to : [place] ownership interest in [facility].provided to:: Spouse    Expected Discharge Plan and Services   Discharge Planning Services: CM Consult Post Acute Care Choice: Home Health Living arrangements for the past 2 months: [place]: RN [place] Agency: [place]) Date [place]: 10/23/22 Time [place]: 47 Representative spoke with at [place]: [place], [place] with [facility]  Prior Living Arrangements/Services Living arrangements for the past 2 months: [place] with:: Spouse Patient language and need for interpreter reviewed:: Yes Do you feel safe going back to the place where you live?: Yes      Need for Family Participation in Patient Care: Yes (Comment) Care giver support system in place?: Yes (comment) Current home services: DME (Patient does not use DME at the home but wife has availability to DME from family member if patient needs in the future) Criminal Activity/Legal Involvement Pertinent to Current Situation/Hospitalization: No - Comment as needed  Activities of Daily Living Home Assistive Devices/Equipment: None ADL Screening (condition at time of admission) Patient's cognitive ability adequate to safely complete daily activities?: Yes Is the patient deaf or have difficulty hearing?: No Does the patient have difficulty seeing, even when wearing glasses/contacts?: No Does the patient have difficulty concentrating, remembering, or making decisions?: No Patient able to express need for assistance with ADLs?: Yes Does the patient have difficulty dressing or bathing?: Yes Independently performs ADLs?: No Communication: Independent Dressing (OT): Needs assistance Is this a change from baseline?: Pre-admission baseline Grooming: Independent Feeding: Independent Bathing: Needs assistance Is this a change from baseline?: Pre-admission baseline Toileting: Independent In/Out Bed: Needs assistance Is this a change from baseline?: Pre-admission baseline Walks in Home: Needs assistance Is this  a change from baseline?: Pre-admission baseline Does the patient have difficulty walking or climbing stairs?: Yes Weakness of Legs: Both Weakness of Arms/Hands: None  Permission Sought/Granted Permission sought to share information with : Case Manager, Family  Supports       Permission granted to share info w AGENCY: Patient active with [facility] - renewal orders placed  Permission granted to share info w Relationship: spouse     Emotional Assessment Appearance:: Appears stated age Attitude/Demeanor/Rapport: Gracious Affect (typically observed): Accepting Orientation: : Oriented to Self, Oriented to Place, Oriented to  Time, Oriented to Situation Alcohol / Substance Use: Not Applicable Psych Involvement: No (comment)  Admission diagnosis:  Biliary obstruction [K83.1] 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 ([place]) [K83.1, C80.1] Patient Active Problem List   Diagnosis Date Noted   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 ([place]) 10/21/2022   Chronic kidney disease, stage 3a ([place]) 10/21/2022   Anemia of chronic disease 10/21/2022   Ischemic cardiomyopathy 09/11/2022   AKI (acute kidney injury) ([place]) 09/11/2022   Thrombus in heart chamber 06/09/2022   Encounter for therapeutic drug monitoring 06/09/2022   Lobar pneumonia ([place]) 11/28/2021   Acute on chronic systolic CHF (congestive heart failure) ([place]) 11/27/2021   Coronary artery disease 11/27/2021   Tobacco abuse 11/27/2021   Acute respiratory failure with hypoxia ([place]) 11/27/2021   Elevated troponin 11/27/2021   NSTEMI (non-ST elevated myocardial infarction) ([place]) 09/08/2021   ACS (acute coronary syndrome) ([place]) 09/08/2021   Nausea without vomiting 09/01/2021   Dehydration 07/23/2021   Port-A-Cath in place 07/20/2021   Atherosclerosis of native arteries of extremities with intermittent claudication, bilateral legs ([place]) 06/30/2021   Abnormal levels of other serum enzymes 06/30/2021   Nicotine dependence, cigarettes, uncomplicated 0000000   Biliary tract cancer ([place]) 06/12/2021   Hyperbilirubinemia    Sepsis ([place]) 05/25/2021   SIRS (systemic inflammatory response syndrome) ([place]) 04/28/2021   Cholangiocarcinoma ([place]) 04/10/2021   Calculus of bile duct without cholangitis or cholecystitis without  obstruction 04/10/2021   Acute febrile illness 03/12/2021   Acute cholecystitis 03/12/2021   Diverticulitis 03/12/2021   Transaminitis 03/12/2021   Leukocytosis 03/12/2021   Hyponatremia 03/12/2021   Hyperglycemia due to diabetes mellitus ([place]) 03/12/2021   GERD (gastroesophageal reflux disease) 03/12/2021   Hypoalbuminemia due to protein-calorie malnutrition ([place]) 03/12/2021   Type 2 diabetes mellitus without complications ([place]) [id number]   Gastroesophageal reflux disease 03/12/2021   Elevated LFTs    Choledocholithiasis    Obstructive jaundice 02/24/2021   Class 1 obesity due to excess calories with body mass index (BMI) of 31.0 to 31.9 in adult 02/24/2021   Controlled diabetes mellitus type 2 with complications ([place]) [id number]   HLD (hyperlipidemia) 02/24/2021   Hypertension 02/24/2021   Tobacco dependence 02/24/2021   Elevated blood-pressure reading, without diagnosis of hypertension 02/24/2021   PCP:  Redmond School, MD Pharmacy:   [facility], [address] [place] [address] Phone: [phone] Fax: [phone]  [id number] [place], [address] [place] [address] Phone: [phone] Fax: [phone]     Social Determinants of Health (SDOH) Social History: SDOH Screenings   Food Insecurity: No Food Insecurity (10/22/2022)  Housing: Low Risk  (10/22/2022)  Transportation Needs: No Transportation Needs (10/22/2022)  Utilities: Not At Risk (10/22/2022)  Alcohol Screen: Low Risk  (03/07/2021)  Depression (PHQ2-9): Low Risk  (03/07/2021)  Financial Resource Strain: Medium Risk (03/07/2021)  Physical Activity: Insufficiently Active (03/07/2021)  Social  Connections: Moderately Integrated (03/07/2021)  Stress: No Stress Concern Present (03/07/2021)  Tobacco Use: High Risk (10/23/2022)   SDOH Interventions:     Readmission Risk Interventions    10/23/2022    2:35 PM  11/28/2021    2:06 PM 05/27/2021   12:06 PM  Readmission Risk Prevention Plan  Transportation Screening Complete Complete Complete  PCP or Specialist Appt within 3-5 Days Complete    Home Care Screening   Complete  Medication Review (RN CM)   Complete  HRI or Home Care Consult Complete    Social Work Consult for [place] Planning/Counseling Complete    Palliative Care Screening Complete    Medication Review Press photographer) Complete Complete   PCP or Specialist appointment within 3-5 days of discharge  Not Complete   HRI or Home Care Consult  Complete   SW Recovery Care/Counseling Consult  Complete   Palliative Care Screening  Not Complete   [place]  Not Complete

## 2022-10-23 NOTE — Progress Notes (Signed)
PROGRESS NOTE    Jonathan Keith  [id number] DOB: [birth year] DOA: 10/21/2022 PCP: Redmond School, MD   Brief Narrative:  HPI: Jonathan Keith is a 67 y.o. male with medical history significant for metastatic cholangiocarcinoma with malignant ascites, CAD with ischemic cardiomyopathy (EF 45-50%), recent LV thrombus s/p treatment with Coumadin, CKD stage IIIa, anemia of chronic disease, HLD who presented to the ED due to concern for biliary obstruction.   Patient has known metastatic cholangiocarcinoma with malignant ascites and prior sphincterotomy and plastic biliary stent placement 02/2021.  He is following with oncology, Dr. Delton Coombes, in [place] and is on active treatment with gemcitabine and cisplatin.   MRCP on 10/19/2022 showed similar appearing moderate intrahepatic biliary ductal dilatation with CBD stent in unchanged position.  No visualized mass seen.  Single large gallstone contracted in the central gallbladder near the gallbladder neck was also noted.  He underwent paracentesis same day 3/11 with removal of 3.5 L ascitic fluid.   His oncologist discussed the case with GI, Dr. Rush Landmark.  Due to no outpatient ERCP availability but decision was made for patient to present to the ED with plan for admission and anticipated ERCP and possible stenting on 3/15.   Patient states that he has continued right upper quadrant abdominal pain not really changed from his baseline.  Abdominal swelling has gone down with paracentesis.  He has occasional night sweats but denies fevers.   ED Course  Labs/Imaging on admission: I have personally reviewed following labs and imaging studies.   Initial vitals showed BP 96/60, pulse 86, RR 17, temp 97.8 F, SpO2 97% on room air.   Labs showed WBC 4.9, hemoglobin 9.8, platelets 133,000, sodium 128, potassium 4.3, bicarb 23, BUN 21, creatinine 1.51, serum glucose 121, AST 70, ALT 68, alk phos 257, total bilirubin 2.4, lactic acid 1.5.   EDP  discussed with GI, Dr. Rush Landmark, who recommended medical admission with anticipation for ERCP and possible stenting on Friday 3/15.  The hospitalist service was consulted to admit for further evaluation and management.  Assessment & Plan:   Principal Problem:   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 [facility]) Active Problems:   Controlled diabetes mellitus type 2 with complications (HCC)   HLD (hyperlipidemia)   Elevated LFTs   Cholangiocarcinoma ([place])   Hyponatremia   Coronary artery disease   Chronic kidney disease, stage 3a (HCC)   Anemia of chronic disease  Biliary obstruction in setting of metastatic cholangiocarcinoma s/p prior CBD stent 2022: Admitted due to concern for biliary obstruction.  MRCP 3/11 showed CBD stent within the central common bile duct and intrahepatic biliary ductal dilatation.  LFTs remain persistently elevated but improved from 2 days ago. -GI, Dr. Rush Landmark has been consulted and plan for ERCP on 10/23/2022.   Malignant ascites: S/p paracentesis on 3/11 with 3.5 L fluid pulled off.  He was given 50 g IV albumin with procedure.  Currently very minimal ascites.  No need for paracentesis.   Coronary artery disease with ischemic cardiomyopathy (EF 45-50%): Denies chest pain.  Continue Coreg and lisinopril.   History of LV thrombus: Now off Coumadin after repeat echo 09/14/2022 did not show evidence of persistent LV thrombus.   Anemia of chronic disease: Hemoglobin stable, continue to monitor.   Chronic hyponatremia: Baseline between 127-133.  Currently around baseline.   CKD stage IIIa: At baseline.  Monitor.   Type 2 diabetes: Holding metformin and placed on SSI.   Hyperlipidemia: Hold statin for now due to elevated LFTs.  BPH: Continue alfuzosin.  He has chronic difficulty urinating due to BPH and known bladder calculi.  He follows with urology, Dr. Alyson Ingles, and is planned for cystoscopy with litholapaxy on 11/19/22.  DVT prophylaxis: SCDs Start:  10/22/22 0025   Code Status: Full Code  Family Communication: Wife and son present at bedside.  Plan of care discussed with patient in length and he/she verbalized understanding and agreed with it.  Status is: Inpatient Remains inpatient appropriate because: Scheduled for ERCP 10/23/2022   Estimated body mass index is 21.56 kg/m as calculated from the following:   Height as of this encounter: 5\' 11"  (1.803 m).   Weight as of this encounter: 70.1 kg.    Nutritional Assessment: Body mass index is 21.56 kg/m.Marland Kitchen Seen by dietician.  I agree with the assessment and plan as outlined below: Nutrition Status:        . Skin Assessment: I have examined the patient's skin and I agree with the wound assessment as performed by the wound care RN as outlined below:    Consultants:  GI and oncology  Procedures:  As above  Antimicrobials:  Anti-infectives (From admission, onward)    None         Subjective: Patient seen and examined.  Wife and son at the bedside.  Patient has no complaints.  Denied abdominal pain.  Objective: Vitals:   10/22/22 2026 10/23/22 0502 10/23/22 0754 10/23/22 0925  BP: 109/67 100/64 106/64 122/75  Pulse: 86 86 80 81  Resp: 17 17 15 18   Temp: 98.8 F (37.1 C) 97.9 F (36.6 C) 98.1 F (36.7 C) (!) 97.2 F (36.2 C)  TempSrc: Oral  Oral Temporal  SpO2: 98% 96% 95% 96%  Weight:      Height:        Intake/Output Summary (Last 24 hours) at 10/23/2022 1009 Last data filed at 10/23/2022 0958 Gross per 24 hour  Intake 211.67 ml  Output --  Net 211.67 ml    Filed Weights   10/21/22 1304 10/22/22 0156  Weight: 73 kg 70.1 kg    Examination:  General exam: Appears calm and comfortable, appears icteric Respiratory system: Clear to auscultation. Respiratory effort normal. Cardiovascular system: S1 & S2 heard, RRR. No JVD, murmurs, rubs, gallops or clicks. No pedal edema. Gastrointestinal system: Abdomen is nondistended, soft and nontender. No  organomegaly or masses felt. Normal bowel sounds heard. Central nervous system: Alert and oriented. No focal neurological deficits. Extremities: Symmetric 5 x 5 power. Skin: No rashes, lesions or ulcers.  Psychiatry: Judgement and insight appear normal. Mood & affect appropriate.    Data Reviewed: I have personally reviewed following labs and imaging studies  CBC: Recent Labs  Lab 10/19/22 0811 10/21/22 1305 10/22/22 1147 10/23/22 0900  WBC 6.3 4.9 4.4 4.3  NEUTROABS 4.6 2.8  --  2.3  HGB 9.8* 9.8* 8.9* 9.2*  HCT 28.7* 27.7* 26.3* 27.5*  MCV 95.3 94.2 95.3 97.5  PLT 101* 133* 129* 136*    Basic Metabolic Panel: Recent Labs  Lab 10/19/22 0811 10/21/22 1305 10/22/22 1147 10/23/22 0900  NA 127* 128* 133* 133*  K 4.0 4.3 4.3 4.2  CL 97* 96* 100 104  CO2 23 23 24 23   GLUCOSE 143* 121* 110* 109*  BUN 21 21 21 19   CREATININE 1.33* 1.51* 1.40* 1.36*  CALCIUM 7.9* 8.7* 8.4* 8.3*  MG 1.9  --   --   --     GFR: Estimated Creatinine Clearance: 53 mL/min (A) (by C-G formula  based on SCr of 1.36 mg/dL (H)). Liver Function Tests: Recent Labs  Lab 10/19/22 0811 10/21/22 1305 10/22/22 1147 10/23/22 0900  AST 129* 70* 69* 75*  ALT 101* 68* 61* 66*  ALKPHOS 318* 257* 238* 255*  BILITOT 4.3* 2.4* 2.4* 2.3*  PROT 6.1* 6.1* 5.3* 5.6*  ALBUMIN 2.4* 2.7* 2.2* 2.3*    No results for input(s): "LIPASE", "AMYLASE" in the last 168 hours. No results for input(s): "AMMONIA" in the last 168 hours. Coagulation Profile: Recent Labs  Lab 10/22/22 1147  INR 1.2   Cardiac Enzymes: No results for input(s): "CKTOTAL", "CKMB", "CKMBINDEX", "TROPONINI" in the last 168 hours. BNP (last 3 results) No results for input(s): "PROBNP" in the last 8760 hours. HbA1C: No results for input(s): "HGBA1C" in the last 72 hours. CBG: Recent Labs  Lab 10/22/22 0756 10/22/22 1132 10/22/22 1645 10/22/22 2029 10/23/22 0756  GLUCAP 120* 117* 116* 168* 117*    Lipid Profile: No results for  input(s): "CHOL", "HDL", "LDLCALC", "TRIG", "CHOLHDL", "LDLDIRECT" in the last 72 hours. Thyroid Function Tests: No results for input(s): "TSH", "T4TOTAL", "FREET4", "T3FREE", "THYROIDAB" in the last 72 hours. Anemia Panel: No results for input(s): "VITAMINB12", "FOLATE", "FERRITIN", "TIBC", "IRON", "RETICCTPCT" in the last 72 hours. Sepsis Labs: Recent Labs  Lab 10/21/22 1305 10/21/22 2205  LATICACIDVEN 1.5 1.7     No results found for this or any previous visit (from the past 240 hour(s)).   Radiology Studies: No results found.  Scheduled Meds:  [MAR Hold] alfuzosin  10 mg Oral QHS   [MAR Hold] carvedilol  3.125 mg Oral BID WC   [MAR Hold] Chlorhexidine Gluconate Cloth  6 each Topical Q0600   [MAR Hold] insulin aspart  0-9 Units Subcutaneous TID WC   [MAR Hold] lisinopril  2.5 mg Oral Daily   [MAR Hold] pantoprazole  40 mg Oral Daily   Continuous Infusions:  sodium chloride 20 mL/hr at 10/22/22 2000   lactated ringers       LOS: 2 days   Darliss Cheney, MD [facility]  10/23/2022, 10:09 AM   *Please note that this is a verbal dictation therefore any spelling or grammatical errors are due to the "[place] [facility]" system interpretation.  Please page via [place] and do not message via secure chat for urgent patient care matters. Secure chat can be used for non urgent patient care matters.  How to contact the [facility] Attending or Consulting provider [place] or covering provider during after hours [place], for this patient?  Check the care team in [facility] and look for a) attending/consulting [facility] provider listed and b) the [facility] team listed. Page or secure chat [phone]. Log into [email or web address] and use [place] universal password to access. If you do not have the password, please contact the hospital operator. Locate the [facility] provider you are looking for under [facility] and page to a number that you can be directly reached. If you still have difficulty reaching the  provider, please page the [facility] (Director on Call) for the Hospitalists listed on amion for assistance.

## 2022-10-23 NOTE — Anesthesia Postprocedure Evaluation (Signed)
Anesthesia Post Note  Patient: Jonathan Keith  Procedure(s) Performed: ENDOSCOPIC RETROGRADE CHOLANGIOPANCREATOGRAPHY (ERCP) BILIARY DILATION REMOVAL OF STONES BILIARY STENT PLACEMENT     Patient location during evaluation: PACU Anesthesia Type: General Level of consciousness: awake and alert Pain management: pain level controlled Vital Signs Assessment: post-procedure vital signs reviewed and stable Respiratory status: spontaneous breathing, nonlabored ventilation, respiratory function stable and patient connected to nasal cannula oxygen Cardiovascular status: blood pressure returned to baseline and stable Postop Assessment: no apparent nausea or vomiting Anesthetic complications: no  No notable events documented.  Last Vitals:  Vitals:   10/23/22 1120 10/23/22 1130  BP: 125/71 125/84  Pulse: 74 75  Resp: 17 11  Temp:    SpO2: 96% 95%    Last Pain:  Vitals:   10/23/22 1130  TempSrc:   PainSc: 0-No pain                 Tiajuana Amass

## 2022-10-23 NOTE — Interval H&P Note (Signed)
History and Physical Interval Note:  10/23/2022 9:35 AM  Jonathan Keith  has presented today for surgery, with the diagnosis of Cholangiocarcinoma, abnormal LFTs.  The various methods of treatment have been discussed with the patient and family. After consideration of risks, benefits and other options for treatment, the patient has consented to  Procedure(s): ENDOSCOPIC RETROGRADE CHOLANGIOPANCREATOGRAPHY (ERCP) (N/A) as a surgical intervention.  The patient's history has been reviewed, patient examined, no change in status, stable for surgery.  I have reviewed the patient's chart and labs.  Questions were answered to the patient's satisfaction.     The risks of an ERCP were discussed at length, including but not limited to the risk of perforation, bleeding, abdominal pain, post-ERCP pancreatitis (while usually mild can be severe and even life threatening).   [employer]

## 2022-10-23 NOTE — Anesthesia Preprocedure Evaluation (Signed)
Anesthesia Evaluation  Patient identified by MRN, date of birth, ID band Patient awake    Reviewed: Allergy & Precautions, NPO status , Patient's Chart, lab work & pertinent test results  Airway Mallampati: III  TM Distance: >3 FB Neck ROM: Limited    Dental  (+) Dental Advisory Given   Pulmonary Current Smoker and Patient abstained from smoking.   breath sounds clear to auscultation       Cardiovascular hypertension, Pt. on medications + CAD, + Past MI, + Peripheral Vascular Disease and +CHF   Rhythm:Regular Rate:Normal     Neuro/Psych negative neurological ROS     GI/Hepatic ,GERD  ,,CholangioCA with elevated LFTs Cholangiocarcinoma    Endo/Other  diabetes, Type 2, Oral Hypoglycemic Agents    Renal/GU CRFRenal disease     Musculoskeletal  (+) Arthritis ,    Abdominal   Peds  Hematology  (+) Blood dyscrasia, anemia   Anesthesia Other Findings   Reproductive/Obstetrics                             Anesthesia Physical Anesthesia Plan  ASA: 3  Anesthesia Plan: General   Post-op Pain Management: Minimal or no pain anticipated   Induction: Intravenous  PONV Risk Score and Plan: 1 and Dexamethasone, Ondansetron and Treatment may vary due to age or medical condition  Airway Management Planned: Oral ETT  Additional Equipment:   Intra-op Plan:   Post-operative Plan: Extubation in OR  Informed Consent: I have reviewed the patients History and Physical, chart, labs and discussed the procedure including the risks, benefits and alternatives for the proposed anesthesia with the patient or authorized representative who has indicated his/her understanding and acceptance.       Plan Discussed with:   Anesthesia Plan Comments:        Anesthesia Quick Evaluation

## 2022-10-23 NOTE — Transfer of Care (Signed)
Immediate Anesthesia Transfer of Care Note  Patient: Jonathan Keith  Procedure(s) Performed: ENDOSCOPIC RETROGRADE CHOLANGIOPANCREATOGRAPHY (ERCP) BILIARY DILATION REMOVAL OF STONES BILIARY STENT PLACEMENT  Patient Location: PACU  Anesthesia Type:General  Level of Consciousness: awake and alert   Airway & Oxygen Therapy: Patient Spontanous Breathing  Post-op Assessment: Report given to RN and Post -op Vital signs reviewed and stable  Post vital signs: Reviewed and stable  Last Vitals:  Vitals Value Taken Time  BP    Temp    Pulse    Resp    SpO2      Last Pain:  Vitals:   10/23/22 0925  TempSrc: Temporal  PainSc: 0-No pain         Complications: No notable events documented.

## 2022-10-23 NOTE — Progress Notes (Signed)
Dr. Sidney Ace on call made aware of pt's VS.  Pt denies any dizziness or pain and remains alert and oriented appropriately.  Pt's significant other at bedside.  Will give 258ml bolus as ordered.    10/23/22 2011 10/23/22 2043  Vitals  Temp 98.5 F (36.9 C)  --   Temp Source Oral  --   BP (!) 87/53 (!) 85/48  MAP (mmHg) (!) 63 (!) 59  BP Location Left Arm Left Arm  BP Method Automatic Automatic  Patient Position (if appropriate) Lying Lying  Pulse Rate 81 79  Pulse Rate Source  --  Monitor  Resp 16 18  MEWS COLOR  MEWS Score Color Green Green  Oxygen Therapy  SpO2 96 % 96 %  O2 Device Room Air Room Air  Pain Assessment  Pain Scale  --  0-10  Pain Score  --  0  POSS Scale (Pasero Opioid Sedation Scale)  POSS *See Group Information*  --  1-Acceptable,Awake and alert   Ayesha Mohair BSN RN [facility] 10/23/2022, 8:56 PM

## 2022-10-23 NOTE — Progress Notes (Signed)
VS as below after 546ml NS bolus.  Pt ambulated to BR to void without issues.  Dr. Sidney Ace made aware of VS.    10/23/22 2313  Vitals  Temp 98.2 F (36.8 C)  Temp Source Oral  BP (!) 95/55  MAP (mmHg) 66  BP Location Left Arm  BP Method Automatic  Patient Position (if appropriate) Lying  Pulse Rate 85  Pulse Rate Source Monitor  Resp 18  MEWS COLOR  MEWS Score Color Green  Oxygen Therapy  SpO2 95 %  O2 Device Room Air  Pain Assessment  Pain Scale 0-10  Pain Score 0  POSS Scale (Pasero Opioid Sedation Scale)  POSS *See Group Information* 1-Acceptable,Awake and alert   Ayesha Mohair BSN RN [facility] 10/23/2022, 11:26 PM

## 2022-10-23 NOTE — Op Note (Addendum)
[facility] Patient Name: Jonathan Keith Procedure Date : 10/23/2022 MRN: [id number] Attending MD: Justice Britain , MD, [id number] Date of Birth: 04-29-1956 CSN: [id number] Age: 67 Admit Type: Inpatient Procedure:                ERCP Indications:              Abnormal MRCP, Jaundice, Abnormal liver function                            test, Malignant liver duct tumor, Cholangiocarcinoma Providers:                Justice Britain, MD, Doristine Johns, RN, Darliss Cheney, Technician Referring MD:             Derek Jack, MD, [employer], MD Medicines:                General Anesthesia, Indomethacin 100 mg PR, Cipro                            400 mg IV, Glucagon [id number] mg IV Complications:            No immediate complications. Estimated Blood Loss:     Estimated blood loss was minimal. Procedure:                Pre-Anesthesia Assessment:                           - Prior to the procedure, a History and Physical                            was performed, and patient medications and                            allergies were reviewed. The patient's tolerance of                            previous anesthesia was also reviewed. The risks                            and benefits of the procedure and the sedation                            options and risks were discussed with the patient.                            All questions were answered, and informed consent                            was obtained. Prior Anticoagulants: The patient has                            taken no anticoagulant or antiplatelet agents. ASA  Grade Assessment: III - A patient with severe                            systemic disease. After reviewing the risks and                            benefits, the patient was deemed in satisfactory                            condition to undergo the procedure.                           After obtaining  informed consent, the scope was                            passed under direct vision. Throughout the                            procedure, the patient's blood pressure, pulse, and                            oxygen saturations were monitored continuously. The                            [id number]) Olympus duodenoscope was                            introduced through the mouth, and used to inject                            contrast into and used to inject contrast into the                            bile duct. The ERCP was accomplished without                            difficulty. The patient tolerated the procedure. Scope In: Scope Out: Findings:      A scout film of the abdomen was obtained. One stent ending in the main       bile duct was seen. Multiple coils were seen in the right upper quadrant       of the abdomen.      The esophagus was successfully intubated under direct vision without       detailed examination of the pharynx, larynx, and associated structures,       and upper GI tract. Passage of the duodena scope into the duodenum       required the patient in extreme left lateral positioning. A biliary       sphincterotomy had been performed. The sphincterotomy appeared open.      A short 0.035 inch Soft Jagwire was passed into the biliary tree. The       Hydratome sphincterotome was passed over the guidewire and the bile duct       was then deeply cannulated. Contrast was injected. I personally       interpreted the bile  duct images. Ductal flow of contrast was adequate.       Image quality was adequate. Contrast extended to the hepatic ducts.       Opacification of the hepatic duct bifurcation, and what appears to be       just left main hepatic duct and left intrahepatic branches was       successful. I could not truly visualize a right hepatic system. The       middle third of the main bile duct contained one uncovered metal stent       which looked to be below  the bifurcation slightly. The hepatic duct       bifurcation and left main hepatic duct contained a single severe       stenosis 20 mm in length. The left intrahepatic branches were mildly       dilated. The largest diameter was 5 mm. The biliary tree was swept with       a retrieval balloon. Sludge was swept from the duct. An occlusion       cholangiogram was performed that showed the biliary stenosis. The       hepatic duct bifurcation and the left main hepatic duct were       successfully dilated with a Hurricaneaine 4 mm balloon dilator. The       biliary tree was then reswept with a retrieval balloon. More thick       sludge as well as a small amount of oozing was swept from the duct.       Although the patient's LFTs have down trended somewhat, decision was       made to attempt stent placement to see if they would further improve.       One 8.5 Fr by 9 cm transpapillary plastic biliary stent with a single       external flap and a single internal flap was placed through the       uncovered self-expanding metal stent and above the stricturing into the       left hepatic duct. The stent was felt to be in good position.      A pancreatogram was not performed.      The duodenoscope was withdrawn from the patient. Impression:               - Extreme left lateral positioning was required to                            intubate the duodenum.                           - Prior biliary sphincterotomy appeared open.                           - Presence of UCSEMS within the biliary tree.                           - A single severe biliary stricture was found in                            the left main hepatic duct. The stricture was  malignant appearing.                           -It is difficult to discern as a result of the                            coils, but I do not see a great right hepatic                            system. I suspect the patient really has just  the                            left hepatic system currently because of the                            cholangiocarcinoma. A single severe biliary                            stricture was noted in the left hepatic duct area                            and bifurcation. This led to notation of what was                            felt to be left intrahepatic branches that were                            mildly dilated.                           - The stricture was successfully dilated.                           - The biliary tree was swept and sludge was found.                           - One plastic biliary stent was placed through the                            UCSEMS into the left hepatic duct to traverse the                            stricture. Recommendation:           - The patient will be observed post-procedure,                            until all discharge criteria are met.                           - Return patient to hospital ward for ongoing care.                           - Advance diet as tolerated.                           -  Observe patient's clinical course.                           - Check liver enzymes (AST, ALT, alkaline                            phosphatase, bilirubin) in the morning.                           - Watch for pancreatitis, bleeding, perforation,                            and cholangitis.                           - Pending that the patient does well with improving                            LFTs, would plan to exchange the biliary stent in 3                            to 5 months to a metal stent (will need to order                            epic uncovered SEMS 6 mm / 8 mm stents to be                            available for this procedure).                           - If patient's LFTs do not improve, query asking                            interventional radiology if any region that is not                            visualized today on our  cholangiogram may end up                            being accessible via percutaneous drainage.                           - Continue ciprofloxacin for 5 days total to                            decrease post interventional ERCP infectious                            complications due to history of cholangiocarcinoma.                           - With the patient's INR being subtherapeutic, can  restart Coumadin tonight. If heparin is required as                            an inpatient, then may start at 6 PM without bolus                            and monitor.                           - If patient is doing quite well, he may be able to                            be potentially discharge later today or tomorrow                            morning early.                           - The findings and recommendations were discussed                            with the patient.                           - The findings and recommendations were discussed                            with the referring physician. Procedure Code(s):        --- Professional ---                           [phone], Endoscopic retrograde                            cholangiopancreatography (ERCP); with placement of                            endoscopic stent into biliary or pancreatic duct,                            including pre- and post-dilation and guide wire                            passage, when performed, including sphincterotomy,                            when performed, each stent                           [id number], Endoscopic retrograde                            cholangiopancreatography (ERCP); with removal of                            calculi/debris from biliary/pancreatic duct(s)  [id number], 18, Endoscopic catheterization of the                            biliary ductal system, radiological supervision and                            interpretation Diagnosis  Code(s):        --- Professional ---                           K83.1, Obstruction of bile duct                           R17, Unspecified jaundice                           R79.89, Other specified abnormal findings of blood                            chemistry                           C24.0, Malignant neoplasm of extrahepatic bile duct                           C22.1, Intrahepatic bile duct carcinoma                           K83.8, Other specified diseases of biliary tract                           R93.2, Abnormal findings on diagnostic imaging of                            liver and biliary tract CPT copyright 2022 [employer]. All rights reserved. The codes documented in this report are preliminary and upon coder review may  be revised to meet current compliance requirements. Justice Britain, MD 10/23/2022 11:34:35 AM Number of Addenda: 0

## 2022-10-23 NOTE — Plan of Care (Signed)

## 2022-10-23 NOTE — Progress Notes (Signed)
257ml bolus given as ordered.  VS as below.  Pt remains asymptomatic.  Dr. Sidney Ace made aware.    10/23/22 2137  Vitals  BP (!) 88/47  MAP (mmHg) (!) 59  BP Location Left Arm  BP Method Automatic  Patient Position (if appropriate) Lying  Pulse Rate 78  Pulse Rate Source Monitor  Resp 18  MEWS COLOR  MEWS Score Color Green  Oxygen Therapy  SpO2 96 %  O2 Device Room Air  MEWS Score  MEWS Temp 0  MEWS Systolic 1  MEWS Pulse 0  MEWS RR 0  MEWS LOC 0  MEWS Score 1   Ayesha Mohair BSN RN [facility] 10/23/2022, 9:41 PM

## 2022-10-23 NOTE — Plan of Care (Signed)
Patient AOX4, VSS throughout shift. Diminished lungs, IS encouraged. All meds given on time as ordered. Denied pain and SOB. Voided in urinal. Pt's wife remains at bedside. POC maintained, will continue to monitor.  Problem: Education: Goal: Ability to describe self-care measures that may prevent or decrease complications (Diabetes Survival Skills Education) will improve Outcome: Progressing Goal: Individualized Educational Video(s) Outcome: Progressing   Problem: Coping: Goal: Ability to adjust to condition or change in health will improve Outcome: Progressing   Problem: Fluid Volume: Goal: Ability to maintain a balanced intake and output will improve Outcome: Progressing   Problem: Health Behavior/Discharge Planning: Goal: Ability to identify and utilize available resources and services will improve Outcome: Progressing Goal: Ability to manage health-related needs will improve Outcome: Progressing   Problem: Metabolic: Goal: Ability to maintain appropriate glucose levels will improve Outcome: Progressing   Problem: Nutritional: Goal: Maintenance of adequate nutrition will improve Outcome: Progressing Goal: Progress toward achieving an optimal weight will improve Outcome: Progressing   Problem: Skin Integrity: Goal: Risk for impaired skin integrity will decrease Outcome: Progressing   Problem: Tissue Perfusion: Goal: Adequacy of tissue perfusion will improve Outcome: Progressing   Problem: Education: Goal: Knowledge of General Education information will improve Description: Including pain rating scale, medication(s)/side effects and non-pharmacologic comfort measures Outcome: Progressing   Problem: Health Behavior/Discharge Planning: Goal: Ability to manage health-related needs will improve Outcome: Progressing   Problem: Clinical Measurements: Goal: Ability to maintain clinical measurements within normal limits will improve Outcome: Progressing Goal: Will remain  free from infection Outcome: Progressing Goal: Diagnostic test results will improve Outcome: Progressing Goal: Respiratory complications will improve Outcome: Progressing Goal: Cardiovascular complication will be avoided Outcome: Progressing   Problem: Activity: Goal: Risk for activity intolerance will decrease Outcome: Progressing   Problem: Nutrition: Goal: Adequate nutrition will be maintained Outcome: Progressing   Problem: Coping: Goal: Level of anxiety will decrease Outcome: Progressing   Problem: Elimination: Goal: Will not experience complications related to bowel motility Outcome: Progressing Goal: Will not experience complications related to urinary retention Outcome: Progressing   Problem: Pain Managment: Goal: General experience of comfort will improve Outcome: Progressing   Problem: Safety: Goal: Ability to remain free from injury will improve Outcome: Progressing   Problem: Skin Integrity: Goal: Risk for impaired skin integrity will decrease Outcome: Progressing

## 2022-10-24 ENCOUNTER — Encounter (HOSPITAL_COMMUNITY): Payer: Self-pay | Admitting: Gastroenterology

## 2022-10-24 DIAGNOSIS — Z9889 Other specified postprocedural states: Secondary | ICD-10-CM

## 2022-10-24 DIAGNOSIS — K831 Obstruction of bile duct: Principal | ICD-10-CM

## 2022-10-24 DIAGNOSIS — E785 Hyperlipidemia, unspecified: Secondary | ICD-10-CM

## 2022-10-24 DIAGNOSIS — C801 Malignant (primary) neoplasm, unspecified: Secondary | ICD-10-CM | POA: Diagnosis not present

## 2022-10-24 DIAGNOSIS — R7989 Other specified abnormal findings of blood chemistry: Secondary | ICD-10-CM

## 2022-10-24 LAB — COMPREHENSIVE METABOLIC PANEL
ALT: 69 U/L — ABNORMAL HIGH (ref 0–44)
AST: 89 U/L — ABNORMAL HIGH (ref 15–41)
Albumin: 1.9 g/dL — ABNORMAL LOW (ref 3.5–5.0)
Alkaline Phosphatase: 221 U/L — ABNORMAL HIGH (ref 38–126)
Anion gap: 6 (ref 5–15)
BUN: 23 mg/dL (ref 8–23)
CO2: 22 mmol/L (ref 22–32)
Calcium: 8 mg/dL — ABNORMAL LOW (ref 8.9–10.3)
Chloride: 105 mmol/L (ref 98–111)
Creatinine, Ser: 1.6 mg/dL — ABNORMAL HIGH (ref 0.61–1.24)
GFR, Estimated: 47 mL/min — ABNORMAL LOW (ref >60)
Glucose, Bld: 98 mg/dL (ref 70–99)
Potassium: 4.2 mmol/L (ref 3.5–5.1)
Sodium: 133 mmol/L — ABNORMAL LOW (ref 135–145)
Total Bilirubin: 2.8 mg/dL — ABNORMAL HIGH (ref 0.3–1.2)
Total Protein: 5 g/dL — ABNORMAL LOW (ref 6.5–8.1)

## 2022-10-24 LAB — GLUCOSE, CAPILLARY
Glucose-Capillary: 98 mg/dL (ref 70–99)
Glucose-Capillary: 116 mg/dL — ABNORMAL HIGH (ref 70–99)
Glucose-Capillary: 120 mg/dL — ABNORMAL HIGH (ref 70–99)
Glucose-Capillary: 115 mg/dL — ABNORMAL HIGH (ref 70–99)

## 2022-10-24 LAB — CBC WITH DIFFERENTIAL/PLATELET
Abs Immature Granulocytes: 0.02 10*3/uL (ref 0.00–0.07)
Basophils Absolute: 0 10*3/uL (ref 0.0–0.1)
Basophils Relative: 0 %
Eosinophils Absolute: 0.2 10*3/uL (ref 0.0–0.5)
Eosinophils Relative: 3 %
HCT: 25.2 % — ABNORMAL LOW (ref 39.0–52.0)
Hemoglobin: 8.7 g/dL — ABNORMAL LOW (ref 13.0–17.0)
Immature Granulocytes: 0 %
Lymphocytes Relative: 12 %
Lymphs Abs: 0.6 10*3/uL — ABNORMAL LOW (ref 0.7–4.0)
MCH: 33.2 pg (ref 26.0–34.0)
MCHC: 34.5 g/dL (ref 30.0–36.0)
MCV: 96.2 fL (ref 80.0–100.0)
Monocytes Absolute: 1.2 10*3/uL — ABNORMAL HIGH (ref 0.1–1.0)
Monocytes Relative: 24 %
Neutro Abs: 3 10*3/uL (ref 1.7–7.7)
Neutrophils Relative %: 61 %
Platelets: 115 10*3/uL — ABNORMAL LOW (ref 150–400)
RBC: 2.62 MIL/uL — ABNORMAL LOW (ref 4.22–5.81)
RDW: 14.6 % (ref 11.5–15.5)
WBC: 4.9 10*3/uL (ref 4.0–10.5)
nRBC: 0 % (ref 0.0–0.2)

## 2022-10-24 LAB — LIPASE, BLOOD: Lipase: 58 U/L — ABNORMAL HIGH (ref 11–51)

## 2022-10-24 LAB — LACTIC ACID, PLASMA: Lactic Acid, Venous: 1.2 mmol/L (ref 0.5–1.9)

## 2022-10-24 MED ORDER — ACETAMINOPHEN 325 MG PO TABS
650.0000 mg | ORAL_TABLET | Freq: Once | ORAL | Status: AC
  Administered 2022-10-24: 650 mg via ORAL
  Filled 2022-10-24: qty 2

## 2022-10-24 MED ORDER — SODIUM CHLORIDE 0.9 % IV BOLUS
250.0000 mL | Freq: Once | INTRAVENOUS | Status: AC
  Administered 2022-10-24: 250 mL via INTRAVENOUS

## 2022-10-24 NOTE — Plan of Care (Signed)

## 2022-10-24 NOTE — Progress Notes (Signed)
Dr. Sidney Ace made aware of VS after last 224ml bolus.  Blood culture x 1 ordered and a one time dose of Tylenol as well.    10/24/22 0627  Vitals  Temp (!) 100.7 F (38.2 C)  Temp Source Oral  BP 102/60  MAP (mmHg) 72  BP Location Left Arm  BP Method Automatic  Patient Position (if appropriate) Lying  Pulse Rate 92  Pulse Rate Source Monitor  Resp 18  MEWS COLOR  MEWS Score Color Green  Oxygen Therapy  SpO2 94 %  O2 Device Room Air  Pain Assessment  Pain Scale 0-10  Pain Score 0  POSS Scale (Pasero Opioid Sedation Scale)  POSS *See Group Information* 1-Acceptable,Awake and alert   Ayesha Mohair BSN RN [facility] 10/24/2022, 6:55 AM

## 2022-10-24 NOTE — Progress Notes (Signed)
Jonathan Keith Progress Note  CC:  Biliary obstruction with cholangiocarcinoma   Subjective:  Had temp of 100.7 early this AM.  Feels good.  No new abdominal pain.  Is eating ok.  Objective:  Vital signs in last 24 hours: Temp:  [97.8 F (36.6 C)-100.7 F (38.2 C)] 98.3 F (36.8 C) (03/16 0801) Pulse Rate:  [71-92] 86 (03/16 0801) Resp:  [14-18] 15 (03/16 0801) BP: (85-102)/(47-60) 92/56 (03/16 0801) SpO2:  [94 %-98 %] 95 % (03/16 0801) Weight:  [74.2 kg] 74.2 kg (03/16 0500) Last BM Date : 10/23/22 General:  Alert, Well-developed, in NAD; scleral icterus noted. Heart:  Regular rate and rhythm; no murmurs Pulm:  CTAB.  No W/R/R. Abdomen:  Soft, non-distended.  BS present.  Non-tender. Extremities:  Without edema. Neurologic:  Alert and oriented x 4;  grossly normal neurologically. Psych:  Alert and cooperative. Normal mood and affect.  Intake/Output from previous day: 03/15 0701 - 03/16 0700 In: 1658.3 [I.V.:1208.3; IV Piggyback:450] Out: [id number] [Urine:1060]  Lab Results: Recent Labs    10/22/22 1147 10/23/22 0900 10/24/22 0430  WBC 4.4 4.3 4.9  HGB 8.9* 9.2* 8.7*  HCT 26.3* 27.5* 25.2*  PLT 129* 136* 115*   BMET Recent Labs    10/22/22 1147 10/23/22 0900 10/24/22 0430  NA 133* 133* 133*  K 4.3 4.2 4.2  CL 100 104 105  CO2 24 23 22   GLUCOSE [id number]* 109* 98  BUN 21 19 23   CREATININE 1.40* 1.36* 1.60*  CALCIUM 8.4* 8.3* 8.0*   LFT Recent Labs    10/24/22 0430  PROT 5.0*  ALBUMIN 1.9*  AST 89*  ALT 69*  ALKPHOS 221*  BILITOT 2.8*   PT/INR Recent Labs    10/22/22 1147  LABPROT 15.4*  INR 1.2   DG ERCP  Result Date: 10/23/2022 CLINICAL DATA:  [id number] Surgery, elective [id number] EXAM: ERCP COMPARISON:  MRCP, 10/19/2022.  CT AP, 08/28/2022. FLUOROSCOPY: Exposure Index (as provided by the fluoroscopic device): 88.4 mGy Kerma FINDINGS: Multiple, limited oblique planar images of the RIGHT upper quadrant obtained C-arm. Multiple embolization  coils within liver and pre-existing metallic biliary stent are present. Images demonstrating flexible endoscopy, biliary duct cannulation, retrograde cholangiogram and balloon dilatation of biliary ductal stricture and plastic biliary stent placement. A short segment stricture at the level of the hepatic hilum and proximal common bile duct is present. No overt intrahepatic biliary ductal dilation. IMPRESSION: Fluoroscopic imaging for ERCP. Balloon angioplasty of biliary stricture and plastic biliary stent placement. For complete description of intra procedural findings, please see performing service dictation. Electronically Signed   By: Michaelle Birks M.D.   On: 10/23/2022 11:55   DG C-Arm 1-60 Min-No Report  Result Date: 10/23/2022 Fluoroscopy was utilized by the requesting physician.  No radiographic interpretation.    Assessment / Plan: *67 year old with past medical history complicated by cholangiocarcinoma with multiple biliary stent replacements r/t cholangitis with Psuedomonoas and Enterococcus, currently on palliative Chemotherapy.   Stent placement across the right hepatic system by Dr. Laural Golden in 2022 then another metal stent at [facility] in 06/2022.   ERCP 10/23/22: - A single severe biliary stricture was found in the left main hepatic duct. The stricture was malignant appearing. -It is difficult to discern as a result of the coils, but I do not see a great right hepatic system. I suspect the patient really has just the left hepatic system currently because of the cholangiocarcinoma. A single severe biliary stricture was noted in the  left hepatic duct area and bifurcation. This led to notation of what was felt to be left intrahepatic branches that were mildly dilated. - The stricture was successfully dilated. - The biliary tree was swept and sludge was found. - One plastic biliary stent was placed through the UCSEMS into the left hepatic duct to traverse the stricture.  LFTs only about stable today,  have not down-trended yet.  *Recent LV thrombus s/p treatment with Coumadin, however, has been off of that for a little while now per cardiology.  *Hyponatremia:  Na+ 133, appears to be chronically low  -Abx per Dr. Rush Landmark outline in his procedure note. -Trend LFTs. -Hopefully if LFTs going down and afebrile then home 3/17.    LOS: 3 days   Jonathan Keith. Jonathan Keith  10/24/2022, 2:18 PM

## 2022-10-24 NOTE — Progress Notes (Signed)
Dr. Sidney Ace made aware of BP 90/52.  Additional bolus of 241ml NS ordered.  Pt continues to deny any pain, dizziness or any other discomfort.      10/24/22 0513  Vitals  Temp 100.1 F (37.8 C)  Temp Source Oral  BP (!) 90/52  MAP (mmHg) 65  BP Location Left Arm  BP Method Automatic  Patient Position (if appropriate) Lying  Pulse Rate 91  Pulse Rate Source Monitor  Resp 14  MEWS COLOR  MEWS Score Color Green  Oxygen Therapy  SpO2 96 %  O2 Device Room Air  Pain Assessment  Pain Scale 0-10  Pain Score 0  POSS Scale (Pasero Opioid Sedation Scale)  POSS *See Group Information* 1-Acceptable,Awake and alert   Ayesha Mohair BSN RN [facility] 10/24/2022, 5:59 AM

## 2022-10-24 NOTE — Progress Notes (Signed)
PROGRESS NOTE    CYSON COGAN  [id number] DOB: [birth year] DOA: 10/21/2022 PCP: Redmond School, MD   Brief Narrative:  HPI: Jonathan Keith is a 67 y.o. male with medical history significant for metastatic cholangiocarcinoma with malignant ascites, CAD with ischemic cardiomyopathy (EF 45-50%), recent LV thrombus s/p treatment with Coumadin, CKD stage IIIa, anemia of chronic disease, HLD who presented to the ED due to concern for biliary obstruction.   Patient has known metastatic cholangiocarcinoma with malignant ascites and prior sphincterotomy and plastic biliary stent placement 02/2021.  He is following with oncology, Dr. Delton Coombes, in [place] and is on active treatment with gemcitabine and cisplatin.   MRCP on 10/19/2022 showed similar appearing moderate intrahepatic biliary ductal dilatation with CBD stent in unchanged position.  No visualized mass seen.  Single large gallstone contracted in the central gallbladder near the gallbladder neck was also noted.  He underwent paracentesis same day 3/11 with removal of 3.5 L ascitic fluid.   His oncologist discussed the case with GI, Dr. Rush Landmark.  Due to no outpatient ERCP availability but decision was made for patient to present to the ED with plan for admission and anticipated ERCP and possible stenting on 3/15.   Patient states that he has continued right upper quadrant abdominal pain not really changed from his baseline.  Abdominal swelling has gone down with paracentesis.  He has occasional night sweats but denies fevers.   ED Course  Labs/Imaging on admission: I have personally reviewed following labs and imaging studies.   Initial vitals showed BP 96/60, pulse 86, RR 17, temp 97.8 F, SpO2 97% on room air.   Labs showed WBC 4.9, hemoglobin 9.8, platelets 133,000, sodium 128, potassium 4.3, bicarb 23, BUN 21, creatinine 1.51, serum glucose 121, AST 70, ALT 68, alk phos 257, total bilirubin 2.4, lactic acid 1.5.   EDP  discussed with GI, Dr. Rush Landmark, who recommended medical admission with anticipation for ERCP and possible stenting on Friday 3/15.  The hospitalist service was consulted to admit for further evaluation and management.  Assessment & Plan:   Principal Problem:   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 [facility]) Active Problems:   Controlled diabetes mellitus type 2 with complications (HCC)   HLD (hyperlipidemia)   Elevated LFTs   Cholangiocarcinoma ([place])   Hyponatremia   Coronary artery disease   Chronic kidney disease, stage 3a (HCC)   Anemia of chronic disease  Biliary obstruction in setting of metastatic cholangiocarcinoma s/p prior CBD stent 2022: Admitted due to concern for biliary obstruction.  MRCP 3/11 showed CBD stent within the central common bile duct and intrahepatic biliary ductal dilatation.  Patient underwent ERCP by Dr. Rush Landmark on 10/23/2022.  Was found to have Presence of UCSEMS within the biliary tree. A single severe biliary stricture was found in the left main hepatic duct. The stricture was malignant appearing. A single severe biliary stricture was noted in the left hepatic duct area and bifurcation. The stricture was successfully dilated. The biliary tree was swept and sludge was found. One plastic biliary stent was placed through the UCSEMS into the left hepatic duct to traverse the stricture.  Patient was cleared from GI for discharge however this morning patient has developed low-grade fever 100.7 at around 6:30 AM.  LFTs are slightly elevated including bilirubin as well.  Patient is asymptomatic though.  I discussed with Dr. Rush Landmark and we have decided to observe the patient overnight and repeat labs in the morning.  Discussed with patient and his wife and  daughter at the bedside and they are in agreement.  Malignant ascites: S/p paracentesis on 3/11 with 3.5 L fluid pulled off.  He was given 50 g IV albumin with procedure.  Currently very minimal ascites.  No need for  paracentesis.   Coronary artery disease with ischemic cardiomyopathy (EF 45-50%): Denies chest pain.  Continue Coreg and lisinopril.   History of LV thrombus: Now off Coumadin after repeat echo 09/14/2022 did not show evidence of persistent LV thrombus.   Anemia of chronic disease: Hemoglobin stable, continue to monitor.   Chronic hyponatremia: Baseline between 127-133.  Currently around baseline.   CKD stage IIIa: At baseline.  Monitor.   Type 2 diabetes: Holding metformin and placed on SSI.  Blood sugar controlled.   Hyperlipidemia: Hold statin for now due to elevated LFTs.   BPH: Continue alfuzosin.  He has chronic difficulty urinating due to BPH and known bladder calculi.  He follows with urology, Dr. Alyson Ingles, and is planned for cystoscopy with litholapaxy on 11/19/22.  DVT prophylaxis: SCDs Start: 10/22/22 0025   Code Status: Full Code  Family Communication: Wife and daughter present at bedside.  Plan of care discussed with patient in length and he/she verbalized understanding and agreed with it.  Status is: Inpatient Remains inpatient appropriate because: Fever developed post ERCP, needs monitoring overnight.   Estimated body mass index is 22.81 kg/m as calculated from the following:   Height as of this encounter: 5\' 11"  (1.803 m).   Weight as of this encounter: 74.2 kg.    Nutritional Assessment: Body mass index is 22.81 kg/m.Marland Kitchen Seen by dietician.  I agree with the assessment and plan as outlined below: Nutrition Status:        . Skin Assessment: I have examined the patient's skin and I agree with the wound assessment as performed by the wound care RN as outlined below:    Consultants:  GI and oncology  Procedures:  As above  Antimicrobials:  Anti-infectives (From admission, onward)    Start     Dose/Rate Route Frequency Ordered Stop   10/23/22 2000  ciprofloxacin (CIPRO) tablet 500 mg  Status:  Discontinued        500 mg Oral 2 times daily  10/23/22 1136 10/23/22 1137   10/23/22 2000  ciprofloxacin (CIPRO) tablet 500 mg        500 mg Oral 2 times daily 10/23/22 1137 10/28/22 1959         Subjective: Patient seen and examined.  He has no complaints.  He is eager to go home.  Objective: Vitals:   10/24/22 0500 10/24/22 0513 10/24/22 0627 10/24/22 0801  BP:  (!) 90/52 102/60 (!) 92/56  Pulse:  91 92 86  Resp:  14 18 15   Temp:  100.1 F (37.8 C) (!) 100.7 F (38.2 C) 98.3 F (36.8 C)  TempSrc:  Oral Oral Oral  SpO2:  96% 94% 95%  Weight: 74.2 kg     Height:        Intake/Output Summary (Last 24 hours) at 10/24/2022 1052 Last data filed at 10/24/2022 0551 Gross per 24 hour  Intake 1458.33 ml  Output 1060 ml  Net 398.33 ml    Filed Weights   10/21/22 1304 10/22/22 0156 10/24/22 0500  Weight: 73 kg 70.1 kg 74.2 kg    Examination:  General exam: Appears calm and comfortable  Respiratory system: Clear to auscultation. Respiratory effort normal. Cardiovascular system: S1 & S2 heard, RRR. No JVD, murmurs, rubs, gallops or clicks.  No pedal edema. Gastrointestinal system: Abdomen is nondistended, soft and nontender. No organomegaly or masses felt. Normal bowel sounds heard. Central nervous system: Alert and oriented. No focal neurological deficits. Extremities: Symmetric 5 x 5 power. Skin: No rashes, lesions or ulcers.  Psychiatry: Judgement and insight appear normal. Mood & affect appropriate.    Data Reviewed: I have personally reviewed following labs and imaging studies  CBC: Recent Labs  Lab 10/19/22 0811 10/21/22 1305 10/22/22 1147 10/23/22 0900 10/24/22 0430  WBC 6.3 4.9 4.4 4.3 4.9  NEUTROABS 4.6 2.8  --  2.3 3.0  HGB 9.8* 9.8* 8.9* 9.2* 8.7*  HCT 28.7* 27.7* 26.3* 27.5* 25.2*  MCV 95.3 94.2 95.3 97.5 96.2  PLT 101* 133* 129* 136* 115*    Basic Metabolic Panel: Recent Labs  Lab 10/19/22 0811 10/21/22 1305 10/22/22 1147 10/23/22 0900 10/24/22 0430  NA 127* 128* 133* 133* 133*  K 4.0  4.3 4.3 4.2 4.2  CL 97* 96* 100 104 105  CO2 23 23 24 23 22   GLUCOSE 143* 121* 110* 109* 98  BUN 21 21 21 19 23   CREATININE 1.33* 1.51* 1.40* 1.36* 1.60*  CALCIUM 7.9* 8.7* 8.4* 8.3* 8.0*  MG 1.9  --   --   --   --     GFR: Estimated Creatinine Clearance: 47.7 mL/min (A) (by C-G formula based on SCr of 1.6 mg/dL (H)). Liver Function Tests: Recent Labs  Lab 10/19/22 0811 10/21/22 1305 10/22/22 1147 10/23/22 0900 10/24/22 0430  AST 129* 70* 69* 75* 89*  ALT 101* 68* 61* 66* 69*  ALKPHOS 318* 257* 238* 255* 221*  BILITOT 4.3* 2.4* 2.4* 2.3* 2.8*  PROT 6.1* 6.1* 5.3* 5.6* 5.0*  ALBUMIN 2.4* 2.7* 2.2* 2.3* 1.9*    Recent Labs  Lab 10/24/22 0430  LIPASE 58*   No results for input(s): "AMMONIA" in the last 168 hours. Coagulation Profile: Recent Labs  Lab 10/22/22 1147  INR 1.2    Cardiac Enzymes: No results for input(s): "CKTOTAL", "CKMB", "CKMBINDEX", "TROPONINI" in the last 168 hours. BNP (last 3 results) No results for input(s): "PROBNP" in the last 8760 hours. HbA1C: No results for input(s): "HGBA1C" in the last 72 hours. CBG: Recent Labs  Lab 10/23/22 0756 10/23/22 1235 10/23/22 1720 10/23/22 2126 10/24/22 0759  GLUCAP 117* 135* 156* 168* 98    Lipid Profile: No results for input(s): "CHOL", "HDL", "LDLCALC", "TRIG", "CHOLHDL", "LDLDIRECT" in the last 72 hours. Thyroid Function Tests: No results for input(s): "TSH", "T4TOTAL", "FREET4", "T3FREE", "THYROIDAB" in the last 72 hours. Anemia Panel: No results for input(s): "VITAMINB12", "FOLATE", "FERRITIN", "TIBC", "IRON", "RETICCTPCT" in the last 72 hours. Sepsis Labs: Recent Labs  Lab 10/21/22 1305 10/21/22 2205  LATICACIDVEN 1.5 1.7     Recent Results (from the past 240 hour(s))  Culture, blood (single) w Reflex to ID Panel     Status: None (Preliminary result)   Collection Time: 10/24/22  7:38 AM   Specimen: BLOOD RIGHT HAND  Result Value Ref Range Status   Specimen Description BLOOD RIGHT  HAND  Final   Special Requests   Final    BOTTLES DRAWN AEROBIC AND ANAEROBIC Blood Culture adequate volume   Culture   Final    NO GROWTH <12 HOURS Performed at [facility], [place] [address] [place], [place] [address]    Report Status PENDING  Incomplete     Radiology Studies: DG ERCP  Result Date: 10/23/2022 CLINICAL DATA:  [id number] Surgery, elective [id number] EXAM: ERCP COMPARISON:  MRCP, 10/19/2022.  CT AP, 08/28/2022. FLUOROSCOPY: Exposure Index (as provided by the fluoroscopic device): 88.4 mGy Kerma FINDINGS: Multiple, limited oblique planar images of the RIGHT upper quadrant obtained C-arm. Multiple embolization coils within liver and pre-existing metallic biliary stent are present. Images demonstrating flexible endoscopy, biliary duct cannulation, retrograde cholangiogram and balloon dilatation of biliary ductal stricture and plastic biliary stent placement. A short segment stricture at the level of the hepatic hilum and proximal common bile duct is present. No overt intrahepatic biliary ductal dilation. IMPRESSION: Fluoroscopic imaging for ERCP. Balloon angioplasty of biliary stricture and plastic biliary stent placement. For complete description of intra procedural findings, please see performing service dictation. Electronically Signed   By: Michaelle Birks M.D.   On: 10/23/2022 11:55   DG C-Arm 1-60 Min-No Report  Result Date: 10/23/2022 Fluoroscopy was utilized by the requesting physician.  No radiographic interpretation.    Scheduled Meds:  alfuzosin  10 mg Oral QHS   carvedilol  3.125 mg Oral BID WC   Chlorhexidine Gluconate Cloth  6 each Topical Q0600   ciprofloxacin  500 mg Oral BID   insulin aspart  0-9 Units Subcutaneous TID WC   lisinopril  2.5 mg Oral Daily   pantoprazole  40 mg Oral Daily   Continuous Infusions:    LOS: 3 days   Darliss Cheney, MD [facility]  10/24/2022, 10:52 AM   *Please note that this is a verbal dictation therefore any spelling or  grammatical errors are due to the "[place] [employer]" system interpretation.  Please page via [place] and do not message via secure chat for urgent patient care matters. Secure chat can be used for non urgent patient care matters.  How to contact the [facility] Attending or Consulting provider [place] or covering provider during after hours [place], for this patient?  Check the care team in [facility] and look for a) attending/consulting [facility] provider listed and b) the [facility] team listed. Page or secure chat [phone]. Log into [email or web address] and use 's universal password to access. If you do not have the password, please contact the hospital operator. Locate the [facility] provider you are looking for under [facility] and page to a number that you can be directly reached. If you still have difficulty reaching the provider, please page the [facility] (Director on Call) for the Hospitalists listed on amion for assistance.

## 2022-10-25 DIAGNOSIS — K831 Obstruction of bile duct: Secondary | ICD-10-CM | POA: Diagnosis not present

## 2022-10-25 DIAGNOSIS — C801 Malignant (primary) neoplasm, unspecified: Secondary | ICD-10-CM | POA: Diagnosis not present

## 2022-10-25 LAB — COMPREHENSIVE METABOLIC PANEL
ALT: 70 U/L — ABNORMAL HIGH (ref 0–44)
AST: 80 U/L — ABNORMAL HIGH (ref 15–41)
Albumin: 1.9 g/dL — ABNORMAL LOW (ref 3.5–5.0)
Alkaline Phosphatase: 212 U/L — ABNORMAL HIGH (ref 38–126)
Anion gap: 5 (ref 5–15)
BUN: 18 mg/dL (ref 8–23)
CO2: 22 mmol/L (ref 22–32)
Calcium: 8.2 mg/dL — ABNORMAL LOW (ref 8.9–10.3)
Chloride: 107 mmol/L (ref 98–111)
Creatinine, Ser: 1.49 mg/dL — ABNORMAL HIGH (ref 0.61–1.24)
GFR, Estimated: 51 mL/min — ABNORMAL LOW (ref >60)
Glucose, Bld: 104 mg/dL — ABNORMAL HIGH (ref 70–99)
Potassium: 4.2 mmol/L (ref 3.5–5.1)
Sodium: 134 mmol/L — ABNORMAL LOW (ref 135–145)
Total Bilirubin: 2.7 mg/dL — ABNORMAL HIGH (ref 0.3–1.2)
Total Protein: 5.1 g/dL — ABNORMAL LOW (ref 6.5–8.1)

## 2022-10-25 LAB — CBC WITH DIFFERENTIAL/PLATELET
Abs Immature Granulocytes: 0.02 10*3/uL (ref 0.00–0.07)
Basophils Absolute: 0 10*3/uL (ref 0.0–0.1)
Basophils Relative: 1 %
Eosinophils Absolute: 0.2 10*3/uL (ref 0.0–0.5)
Eosinophils Relative: 4 %
HCT: 25.6 % — ABNORMAL LOW (ref 39.0–52.0)
Hemoglobin: 8.5 g/dL — ABNORMAL LOW (ref 13.0–17.0)
Immature Granulocytes: 1 %
Lymphocytes Relative: 19 %
Lymphs Abs: 0.9 10*3/uL (ref 0.7–4.0)
MCH: 32.7 pg (ref 26.0–34.0)
MCHC: 33.2 g/dL (ref 30.0–36.0)
MCV: 98.5 fL (ref 80.0–100.0)
Monocytes Absolute: 1 10*3/uL (ref 0.1–1.0)
Monocytes Relative: 24 %
Neutro Abs: 2.3 10*3/uL (ref 1.7–7.7)
Neutrophils Relative %: 51 %
Platelets: 111 10*3/uL — ABNORMAL LOW (ref 150–400)
RBC: 2.6 MIL/uL — ABNORMAL LOW (ref 4.22–5.81)
RDW: 15.2 % (ref 11.5–15.5)
WBC: 4.4 10*3/uL (ref 4.0–10.5)
nRBC: 0 % (ref 0.0–0.2)

## 2022-10-25 LAB — GLUCOSE, CAPILLARY: Glucose-Capillary: 93 mg/dL (ref 70–99)

## 2022-10-25 MED ORDER — CARVEDILOL 3.125 MG PO TABS: 3.1250 mg | ORAL_TABLET | Freq: Two times a day (BID) | ORAL | 0 refills | Status: DC

## 2022-10-25 MED ORDER — CIPROFLOXACIN HCL 500 MG PO TABS: 500.0000 mg | ORAL_TABLET | Freq: Two times a day (BID) | ORAL | 0 refills | Status: AC

## 2022-10-25 NOTE — Discharge Summary (Addendum)
Physician Discharge Summary  ESAM GOECKNER [id number] DOB: 1956/05/24 DOA: 10/21/2022  PCP: Redmond School, MD  Admit date: 10/21/2022 Discharge date: 10/25/2022 30 Day Unplanned Readmission Risk Score    Flowsheet Row ED to Hosp-Admission (Current) from 10/21/2022 in [facility] [facility]  30 Day Unplanned Readmission Risk Score (%) 27.33 Filed at 10/25/2022 0801       This score is the patient's risk of an unplanned readmission within 30 days of being discharged (0 -100%). The score is based on dignosis, age, lab data, medications, orders, and past utilization.   Low:  0-14.9   Medium: 15-21.9   High: 22-29.9   Extreme: 30 and above          Admitted From: Home Disposition: Home  Recommendations for Outpatient Follow-up:  Follow up with PCP in 1-2 weeks Please obtain BMP/CBC in one week Follow-up with Dr. Rush Landmark in 6 weeks Please follow up with your PCP on the following pending results: Unresulted Labs (From admission, onward)    None         Home Health: Resume home health Equipment/Devices: None  Discharge Condition: Stable CODE STATUS: Full code Diet recommendation: Cardiac  HPI: Jonathan Keith is a 67 y.o. male with medical history significant for metastatic cholangiocarcinoma with malignant ascites, CAD with ischemic cardiomyopathy (EF 45-50%), recent LV thrombus s/p treatment with Coumadin, CKD stage IIIa, anemia of chronic disease, HLD who presented to the ED due to concern for biliary obstruction.   Patient has known metastatic cholangiocarcinoma with malignant ascites and prior sphincterotomy and plastic biliary stent placement 02/2021.  He is following with oncology, Dr. Delton Coombes, in [place] and is on active treatment with gemcitabine and cisplatin.   MRCP on 10/19/2022 showed similar appearing moderate intrahepatic biliary ductal dilatation with CBD stent in unchanged position.  No visualized mass seen.  Single large gallstone  contracted in the central gallbladder near the gallbladder neck was also noted.  He underwent paracentesis same day 3/11 with removal of 3.5 L ascitic fluid.   His oncologist discussed the case with GI, Dr. Rush Landmark.  Due to no outpatient ERCP availability but decision was made for patient to present to the ED with plan for admission and anticipated ERCP and possible stenting on 3/15.   Patient states that he has continued right upper quadrant abdominal pain not really changed from his baseline.  Abdominal swelling has gone down with paracentesis.  He has occasional night sweats but denies fevers.   ED Course  Labs/Imaging on admission: I have personally reviewed following labs and imaging studies.   Initial vitals showed BP 96/60, pulse 86, RR 17, temp 97.8 F, SpO2 97% on room air.   Labs showed WBC 4.9, hemoglobin 9.8, platelets 133,000, sodium 128, potassium 4.3, bicarb 23, BUN 21, creatinine 1.51, serum glucose 121, AST 70, ALT 68, alk phos 257, total bilirubin 2.4, lactic acid 1.5.   EDP discussed with GI, Dr. Rush Landmark, who recommended medical admission with anticipation for ERCP and possible stenting on Friday 3/15.  The hospitalist service was consulted to admit for further evaluation and management. Subjective: Seen and examined.  He has no complaints.  He is excited to go home today.  Daughter at the bedside.  Brief/Interim Summary: Patient was admitted to the hospital service, GI consulted.  Details below.  Biliary obstruction in setting of metastatic cholangiocarcinoma s/p prior CBD stent 2022: Admitted due to concern for biliary obstruction.  MRCP 3/11 showed CBD stent within the central common  bile duct and intrahepatic biliary ductal dilatation.  Patient underwent ERCP by Dr. Rush Landmark on 10/23/2022.  Was found to have Presence of UCSEMS within the biliary tree. A single severe biliary stricture was found in the left main hepatic duct. The stricture was malignant appearing. A  single severe biliary stricture was noted in the left hepatic duct area and bifurcation. The stricture was successfully dilated. The biliary tree was swept and sludge was found. One plastic biliary stent was placed through the UCSEMS into the left hepatic duct to traverse the stricture.  Patient was cleared from GI for discharge however he developed low-grade fever 100.7 at around 6:30 AM on 10/24/2022 and LFTs were slightly rising as well so after discussion with the GI, we decided to keep him in the hospital for overnight observation without antibiotics.  Patient has remained afebrile for 24 hours now and his LFTs are improving today so he is going to be discharged home.  Dr. Rush Landmark will follow-up with him in 6 weeks in the clinic.  GI recommended total of 5 days of ciprofloxacin twice daily.  He received 4 doses, will prescribe him 6 more doses.   Malignant ascites: S/p paracentesis on 3/11 with 3.5 L fluid pulled off.  He was given 50 g IV albumin with procedure.  Currently very minimal ascites.  No need for paracentesis.   Coronary artery disease with ischemic cardiomyopathy (EF 45-50%): Denies chest pain.  Continue Coreg and lisinopril.   History of LV thrombus: Now off Coumadin after repeat echo 09/14/2022 did not show evidence of persistent LV thrombus.   Anemia of chronic disease: Hemoglobin stable, continue to monitor.   Chronic hyponatremia: Baseline between 127-133.  Currently around baseline.   CKD stage IIIa: At baseline.  Monitor.   Type 2 diabetes: Resume home medications.   Hyperlipidemia: Hold statin for now due to elevated LFTs.   BPH: Continue alfuzosin.  He has chronic difficulty urinating due to BPH and known bladder calculi.  He follows with urology, Dr. Alyson Ingles, and is planned for cystoscopy with litholapaxy on 11/19/22.  Essential hypertension: Patient was on Coreg 6.5 mg p.o. twice daily and lisinopril.  His blood pressure remained on low normal side although  he was asymptomatic and lactic acid was normal.  I reduced Coreg to 3.125 mg and I am discharging him on that.  Discharge plan was discussed with patient and/or family member and they verbalized understanding and agreed with it.  Discharge Diagnoses:  Principal Problem:   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 [facility]) Active Problems:   Controlled diabetes mellitus type 2 with complications (HCC)   HLD (hyperlipidemia)   Elevated LFTs   Cholangiocarcinoma ([place])   Hyponatremia   Coronary artery disease   Chronic kidney disease, stage 3a (HCC)   Anemia of chronic disease   History of ERCP   History of biliary duct stent placement   Abnormal LFTs   Biliary obstruction    Discharge Instructions   Allergies as of 10/25/2022   No Known Allergies      Medication List     TAKE these medications    acetaminophen 500 MG tablet Commonly known as: TYLENOL Take 500 mg by mouth every 6 (six) hours as needed for moderate pain or headache.   albuterol 108 (90 Base) MCG/ACT inhaler Commonly known as: VENTOLIN HFA Inhale 2 puffs into the lungs every 6 (six) hours as needed for wheezing or shortness of breath.   alfuzosin 10 MG 24 hr tablet Commonly known as:  UROXATRAL Take 1 tablet (10 mg total) by mouth at bedtime.   atorvastatin 40 MG tablet Commonly known as: LIPITOR Take 1 tablet (40 mg total) by mouth daily at 6 PM.   carvedilol 3.125 MG tablet Commonly known as: COREG Take 1 tablet (3.125 mg total) by mouth 2 (two) times daily with a meal. What changed:  medication strength how much to take when to take this   ciprofloxacin 500 MG tablet Commonly known as: CIPRO Take 1 tablet (500 mg total) by mouth 2 (two) times daily for 6 doses.   CISPLATIN IV Inject into the vein once a week. Days 1, 8 every 21 days   famotidine 20 MG tablet Commonly known as: PEPCID Take 20 mg by mouth 2 (two) times daily.   ferrous sulfate 325 (65 FE) MG tablet Commonly known as:  FerrouSul Take 1 tablet (325 mg total) by mouth daily with breakfast.   furosemide 20 MG tablet Commonly known as: LASIX Take 20 mg by mouth as needed for fluid or edema. If he gains weight five pounds when taking chemo, take one tab a day till he is back to normal weight or within 2 pounds for fluid check before surgery.   GEMZAR IV Inject into the vein once a week. Days 1, 8 every 21 days   Ginseng 100 MG Caps Take 1 capsule by mouth daily.   IMFINZI IV Inject into the vein every 21 ( twenty-one) days.   Intrasite Gel Applipak Gel Apply 1 Units topically daily.   lisinopril 2.5 MG tablet Commonly known as: ZESTRIL Take 1 tablet (2.5 mg total) by mouth daily.   metFORMIN 500 MG tablet Commonly known as: GLUCOPHAGE Take 500 mg by mouth 2 (two) times daily with a meal.   metFORMIN 500 MG tablet Commonly known as: GLUCOPHAGE Take 1 tablet (500 mg total) by mouth 2 (two) times daily with a meal.   Misc. Devices Kit 1 Box by Does not apply route every 7 (seven) days.   nitroGLYCERIN 0.4 MG SL tablet Commonly known as: Nitrostat Place 1 tablet (0.4 mg total) under the tongue every 5 (five) minutes as needed for chest pain.   pantoprazole 40 MG tablet Commonly known as: PROTONIX Take 1 tablet (40 mg total) by mouth daily.   prochlorperazine 10 MG tablet Commonly known as: COMPAZINE TAKE (1) TABLET BY MOUTH EVERY (6) HOURS AS NEEDED. What changed: See the new instructions.   sodium chloride flush 0.9 % Soln Commonly known as: NS 10 mLs by Intracatheter route every 7 (seven) days. What changed: when to take this   tamsulosin 0.4 MG Caps capsule Commonly known as: FLOMAX TAKE (2) CAPSULES BY MOUTH AT BEDTIME. What changed: See the new instructions.   Vitamin D (Ergocalciferol) 1.25 MG (50000 UNIT) Caps capsule Commonly known as: DRISDOL Take 1 capsule by mouth once a week.        Follow-up [place], [facility] Follow up.    Why: [facility] will follow up with you in the next 24-48 hours to renew services for home health RN. Contact information: [address] [place] [address] [phone]         Redmond School, MD Follow up in 1 week(s).   Specialty: Internal Medicine Contact information: [address] [place] [id number] [phone]                No Known Allergies  Consultations: GI   Procedures/Studies:  DG ERCP  Result Date: 10/23/2022 CLINICAL DATA:  [id number] Surgery, elective [id number] EXAM: ERCP COMPARISON:  MRCP, 10/19/2022.  CT AP, 08/28/2022. FLUOROSCOPY: Exposure Index (as provided by the fluoroscopic device): 88.4 mGy Kerma FINDINGS: Multiple, limited oblique planar images of the RIGHT upper quadrant obtained C-arm. Multiple embolization coils within liver and pre-existing metallic biliary stent are present. Images demonstrating flexible endoscopy, biliary duct cannulation, retrograde cholangiogram and balloon dilatation of biliary ductal stricture and plastic biliary stent placement. A short segment stricture at the level of the hepatic hilum and proximal common bile duct is present. No overt intrahepatic biliary ductal dilation. IMPRESSION: Fluoroscopic imaging for ERCP. Balloon angioplasty of biliary stricture and plastic biliary stent placement. For complete description of intra procedural findings, please see performing service dictation. Electronically Signed   By: Michaelle Birks M.D.   On: 10/23/2022 11:55   DG C-Arm 1-60 Min-No Report  Result Date: 10/23/2022 Fluoroscopy was utilized by the requesting physician.  No radiographic interpretation.   MR ABDOMEN MRCP W WO CONTAST  Result Date: 10/20/2022 CLINICAL DATA:  Intrahepatic cholangiocarcinoma, elevated bilirubin EXAM: MRI ABDOMEN WITHOUT AND WITH CONTRAST (INCLUDING MRCP) TECHNIQUE: Multiplanar multisequence MR imaging of the abdomen was performed both before and after the administration of intravenous  contrast. Heavily T2-weighted images of the biliary and pancreatic ducts were obtained, and three-dimensional MRCP images were rendered by post processing. CONTRAST:  35mL GADAVIST GADOBUTROL 1 MMOL/ML IV SOLN COMPARISON:  CT chest abdomen pelvis, 08/28/2022 MR abdomen, 02/25/2021 FINDINGS: Lower chest: No acute abnormality. Hepatobiliary: No directly visualized mass or suspicious contrast enhancement in the liver. Susceptibility artifact related to coiling in the right lobe of the liver (series 23, image 37, 45). Single large gallstone contracted in the central gallbladder near the gallbladder neck (series 4, image 21). Moderate intrahepatic biliary ductal dilatation, similar in appearance to prior examination. No directly visualized mass. Common bile duct stent, although poorly visualized by MRI is an unchanged positioned within the central common bile duct. Pancreas: Unremarkable. No pancreatic ductal dilatation or surrounding inflammatory changes. Spleen: Normal in size without significant abnormality. Adrenals/Urinary Tract: Adrenal glands are unremarkable. Kidneys are normal, without renal calculi, solid lesion, or hydronephrosis. Stomach/Bowel: Stomach is within normal limits. No evidence of bowel wall thickening, distention, or inflammatory changes. Vascular/Lymphatic: Severe aortic atherosclerosis. No enlarged abdominal lymph nodes. Other: No abdominal wall hernia or abnormality. Small volume ascites throughout the upper abdomen, similar in volume to prior examination. Musculoskeletal: No acute or significant osseous findings. IMPRESSION: 1. Moderate intrahepatic biliary ductal dilatation, similar in appearance to prior examination. No directly visualized mass. Common bile duct stent, although poorly visualized by MRI is in unchanged positioned within the central common bile duct. 2. Small volume ascites similar to prior examination. 3. Single large gallstone contracted in the central gallbladder near the  gallbladder neck. 4. Severe aortic atherosclerosis. Aortic Atherosclerosis (ICD10-I70.0). Electronically Signed   By: Delanna Ahmadi M.D.   On: 10/20/2022 09:30   MR 3D Recon At Scanner  Result Date: 10/20/2022 CLINICAL DATA:  Intrahepatic cholangiocarcinoma, elevated bilirubin EXAM: MRI ABDOMEN WITHOUT AND WITH CONTRAST (INCLUDING MRCP) TECHNIQUE: Multiplanar multisequence MR imaging of the abdomen was performed both before and after the administration of intravenous contrast. Heavily T2-weighted images of the biliary and pancreatic ducts were obtained, and three-dimensional MRCP images were rendered by post processing. CONTRAST:  20mL GADAVIST GADOBUTROL 1 MMOL/ML IV SOLN COMPARISON:  CT chest abdomen pelvis, 08/28/2022 MR abdomen, 02/25/2021 FINDINGS: Lower chest: No acute abnormality. Hepatobiliary: No directly visualized mass  or suspicious contrast enhancement in the liver. Susceptibility artifact related to coiling in the right lobe of the liver (series 23, image 37, 45). Single large gallstone contracted in the central gallbladder near the gallbladder neck (series 4, image 21). Moderate intrahepatic biliary ductal dilatation, similar in appearance to prior examination. No directly visualized mass. Common bile duct stent, although poorly visualized by MRI is an unchanged positioned within the central common bile duct. Pancreas: Unremarkable. No pancreatic ductal dilatation or surrounding inflammatory changes. Spleen: Normal in size without significant abnormality. Adrenals/Urinary Tract: Adrenal glands are unremarkable. Kidneys are normal, without renal calculi, solid lesion, or hydronephrosis. Stomach/Bowel: Stomach is within normal limits. No evidence of bowel wall thickening, distention, or inflammatory changes. Vascular/Lymphatic: Severe aortic atherosclerosis. No enlarged abdominal lymph nodes. Other: No abdominal wall hernia or abnormality. Small volume ascites throughout the upper abdomen, similar in  volume to prior examination. Musculoskeletal: No acute or significant osseous findings. IMPRESSION: 1. Moderate intrahepatic biliary ductal dilatation, similar in appearance to prior examination. No directly visualized mass. Common bile duct stent, although poorly visualized by MRI is in unchanged positioned within the central common bile duct. 2. Small volume ascites similar to prior examination. 3. Single large gallstone contracted in the central gallbladder near the gallbladder neck. 4. Severe aortic atherosclerosis. Aortic Atherosclerosis (ICD10-I70.0). Electronically Signed   By: Delanna Ahmadi M.D.   On: 10/20/2022 09:30   US Paracentesis  Result Date: 10/19/2022 INDICATION: Malignant ascites, cholangiocarcinoma EXAM: ULTRASOUND GUIDED THERAPEUTIC PARACENTESIS MEDICATIONS: None. COMPLICATIONS: None immediate. PROCEDURE: Informed written consent was obtained from the patient after a discussion of the risks, benefits and alternatives to treatment. A timeout was performed prior to the initiation of the procedure. Initial ultrasound scanning demonstrates a large amount of ascites within the right lower abdominal quadrant. The right lower abdomen was prepped and draped in the usual sterile fashion. 1% lidocaine was used for local anesthesia. Following this, a 19 gauge, 7-cm, Yueh catheter was introduced. An ultrasound image was saved for documentation purposes. The paracentesis was performed. The catheter was removed and a dressing was applied. The patient tolerated the procedure well without immediate post procedural complication. Patient received post-procedure intravenous albumin; see nursing notes for details. FINDINGS: A total of approximately 3.5 L of yellow ascitic fluid was removed. IMPRESSION: Successful ultrasound-guided paracentesis yielding 3.5 liters of peritoneal fluid. Electronically Signed   By: Lavonia Dana M.D.   On: 10/19/2022 11:42   IR PICC PLACEMENT RIGHT >5 YRS INC IMG GUIDE  Result  Date: 09/30/2022 INDICATION: Cholangiocarcinoma, receiving chemotherapy, exposed eroded port catheter had to be removed. PICC line placed for continued chemotherapy. EXAM: ULTRASOUND AND FLUOROSCOPIC GUIDED PICC LINE INSERTION MEDICATIONS: 1% lidocaine local CONTRAST:  None FLUOROSCOPY TIME:  Six seconds (1 mGy) COMPLICATIONS: None immediate. TECHNIQUE: The procedure, risks, benefits, and alternatives were explained to the patient and informed written consent was obtained. A timeout was performed prior to the initiation of the procedure. The right upper extremity was prepped with chlorhexidine in a sterile fashion, and a sterile drape was applied covering the operative field. Maximum barrier sterile technique with sterile gowns and gloves were used for the procedure. A timeout was performed prior to the initiation of the procedure. Local anesthesia was provided with 1% lidocaine. Under direct ultrasound guidance, the right basilic vein was accessed with a micropuncture kit after the overlying soft tissues were anesthetized with 1% lidocaine. An ultrasound image was saved for documentation purposes. A guidewire was advanced to the level of the superior caval-atrial  junction for measurement purposes and the PICC line was cut to length. A peel-away sheath was placed and a 39 cm, 5 [place], single lumen was inserted to level of the superior caval-atrial junction. A post procedure spot fluoroscopic was obtained. The catheter easily aspirated and flushed and was sutured in place. A dressing was placed. The patient tolerated the procedure well without immediate post procedural complication. FINDINGS: After catheter placement, the tip lies within the superior cavoatrial junction. The catheter aspirates and flushes normally and is ready for immediate use. IMPRESSION: Successful ultrasound and fluoroscopic guided placement of a right basilic vein approach, 39 cm, 5 French, single lumen PICC with tip at the superior  caval-atrial junction. The PICC line is ready for immediate use. Electronically Signed   By: Jerilynn Mages.  Shick M.D.   On: 09/30/2022 15:23   IR REMOVAL TUN ACCESS W/ PORT W/O FL MOD SED  Result Date: 09/30/2022 CLINICAL DATA:  Eroded exposed port site at the skin EXAM: REMOVAL OF IMPLANTED TUNNELED PORT-A-CATH MEDICATIONS: 1% lidocaine local with epinephrine. ANESTHESIA/SEDATION: Moderate (conscious) sedation was employed during this procedure. A total of Versed 1.5 mg and Fentanyl 75 mcg was administered intravenously. Moderate Sedation Time: 17 minutes. The patient's level of consciousness and vital signs were monitored continuously by radiology nursing throughout the procedure under my direct supervision. FLUOROSCOPY TIME:  None PROCEDURE: Informed written consent was obtained from the patient after a discussion of the risk, benefits and alternatives to the procedure. The patient was positioned supine on the fluoroscopy table and the right chest Port-A-Cath site was prepped with chlorhexidine. A sterile gown and gloves were worn during the procedure. Local anesthesia was provided with 1% lidocaine with epinephrine. A timeout was performed prior to the initiation of the procedure. An incision was made overlying the eroded exposed Port-A-Cath with a #15 scalpel. Utilizing sharp and blunt dissection, the Port-A-Cath was removed completely. The pocked was irrigated with sterile saline. Hemostasis obtained. Wound was left open and packed with iodoform gauze to heal by secondary intention. The patient tolerated the procedure well without immediate post procedural complication. FINDINGS: Successful removal of implant Port-A-Cath without immediate post procedural complication. IMPRESSION: Successful removal of implanted Port-A-Cath. Wound was left open and packed with iodoform gauze to heal by secondary intention. Electronically Signed   By: Jerilynn Mages.  Shick M.D.   On: 09/30/2022 15:22     Discharge Exam: Vitals:   10/25/22  0431 10/25/22 0748  BP: 101/64 109/63  Pulse: 88 84  Resp: 17 16  Temp: 98.3 F (36.8 C) 98.6 F (37 C)  SpO2: 97% 96%   Vitals:   10/24/22 1932 10/25/22 0431 10/25/22 0500 10/25/22 0748  BP: 114/65 101/64  109/63  Pulse: 82 88  84  Resp: 18 17  16   Temp: 98.4 F (36.9 C) 98.3 F (36.8 C)  98.6 F (37 C)  TempSrc: Oral   Oral  SpO2: 97% 97%  96%  Weight:   75.1 kg   Height:        General: Pt is alert, awake, not in acute distress Cardiovascular: RRR, S1/S2 +, no rubs, no gallops Respiratory: CTA bilaterally, no wheezing, no rhonchi Abdominal: Soft, NT, ND, bowel sounds + Extremities: no edema, no cyanosis    The results of significant diagnostics from this hospitalization (including imaging, microbiology, ancillary and laboratory) are listed below for reference.     Microbiology: Recent Results (from the past 240 hour(s))  Culture, blood (single) w Reflex to ID Panel  Status: None (Preliminary result)   Collection Time: 10/24/22  7:38 AM   Specimen: BLOOD RIGHT HAND  Result Value Ref Range Status   Specimen Description BLOOD RIGHT HAND  Final   Special Requests   Final    BOTTLES DRAWN AEROBIC AND ANAEROBIC Blood Culture adequate volume   Culture   Final    NO GROWTH < 24 HOURS Performed at [facility], [address] [place], [place] [address]    Report Status PENDING  Incomplete     Labs: BNP (last 3 results) Recent Labs    11/26/21 2132  BNP [id number]*   Basic Metabolic Panel: Recent Labs  Lab 10/19/22 0811 10/21/22 1305 10/22/22 1147 10/23/22 0900 10/24/22 0430 10/25/22 0346  NA 127* 128* 133* 133* 133* 134*  K 4.0 4.3 4.3 4.2 4.2 4.2  CL 97* 96* 100 104 105 107  CO2 23 23 24 23 22 22   GLUCOSE 143* 121* 110* 109* 98 104*  BUN 21 21 21 19 23 18   CREATININE 1.33* 1.51* 1.40* 1.36* 1.60* 1.49*  CALCIUM 7.9* 8.7* 8.4* 8.3* 8.0* 8.2*  MG 1.9  --   --   --   --   --    Liver Function Tests: Recent Labs  Lab 10/21/22 1305  10/22/22 1147 10/23/22 0900 10/24/22 0430 10/25/22 0346  AST 70* 69* 75* 89* 80*  ALT 68* 61* 66* 69* 70*  ALKPHOS 257* 238* 255* 221* 212*  BILITOT 2.4* 2.4* 2.3* 2.8* 2.7*  PROT 6.1* 5.3* 5.6* 5.0* 5.1*  ALBUMIN 2.7* 2.2* 2.3* 1.9* 1.9*   Recent Labs  Lab 10/24/22 0430  LIPASE 58*   No results for input(s): "AMMONIA" in the last 168 hours. CBC: Recent Labs  Lab 10/19/22 0811 10/21/22 1305 10/22/22 1147 10/23/22 0900 10/24/22 0430 10/25/22 0346  WBC 6.3 4.9 4.4 4.3 4.9 4.4  NEUTROABS 4.6 2.8  --  2.3 3.0 2.3  HGB 9.8* 9.8* 8.9* 9.2* 8.7* 8.5*  HCT 28.7* 27.7* 26.3* 27.5* 25.2* 25.6*  MCV 95.3 94.2 95.3 97.5 96.2 98.5  PLT 101* 133* 129* 136* 115* 111*   Cardiac Enzymes: No results for input(s): "CKTOTAL", "CKMB", "CKMBINDEX", "TROPONINI" in the last 168 hours. BNP: Invalid input(s): "POCBNP" CBG: Recent Labs  Lab 10/24/22 0759 10/24/22 1141 10/24/22 1659 10/24/22 1951 10/25/22 0747  GLUCAP 98 116* 120* 115* 93   D-Dimer No results for input(s): "DDIMER" in the last 72 hours. Hgb A1c No results for input(s): "HGBA1C" in the last 72 hours. Lipid Profile No results for input(s): "CHOL", "HDL", "LDLCALC", "TRIG", "CHOLHDL", "LDLDIRECT" in the last 72 hours. Thyroid function studies No results for input(s): "TSH", "T4TOTAL", "T3FREE", "THYROIDAB" in the last 72 hours.  Invalid input(s): "FREET3" Anemia work up No results for input(s): "VITAMINB12", "FOLATE", "FERRITIN", "TIBC", "IRON", "RETICCTPCT" in the last 72 hours. Urinalysis    Component Value Date/Time   COLORURINE AMBER (A) 10/22/2022 0049   APPEARANCEUR CLEAR 10/22/2022 0049   APPEARANCEUR Clear 09/21/2022 1306   LABSPEC 1.017 10/22/2022 0049   PHURINE 5.0 10/22/2022 0049   GLUCOSEU NEGATIVE 10/22/2022 0049   HGBUR NEGATIVE 10/22/2022 0049   BILIRUBINUR NEGATIVE 10/22/2022 0049   BILIRUBINUR Negative 09/21/2022 1306   KETONESUR NEGATIVE 10/22/2022 0049   PROTEINUR 30 (A) 10/22/2022 0049    NITRITE NEGATIVE 10/22/2022 0049   LEUKOCYTESUR NEGATIVE 10/22/2022 0049   Sepsis Labs Recent Labs  Lab 10/22/22 1147 10/23/22 0900 10/24/22 0430 10/25/22 0346  WBC 4.4 4.3 4.9 4.4   Microbiology Recent Results (from  the past 240 hour(s))  Culture, blood (single) w Reflex to ID Panel     Status: None (Preliminary result)   Collection Time: 10/24/22  7:38 AM   Specimen: BLOOD RIGHT HAND  Result Value Ref Range Status   Specimen Description BLOOD RIGHT HAND  Final   Special Requests   Final    BOTTLES DRAWN AEROBIC AND ANAEROBIC Blood Culture adequate volume   Culture   Final    NO GROWTH < 24 HOURS Performed at [facility], [place] [address] [place], [place] [address]    Report Status PENDING  Incomplete     Time coordinating discharge: Over 30 minutes  SIGNED:   Darliss Cheney, MD  [facility] 10/25/2022, 10:48 AM *Please note that this is a verbal dictation therefore any spelling or grammatical errors are due to the "[place] [employer]" system interpretation. If 7PM-7AM, please contact night-coverage [email or web address]

## 2022-10-25 NOTE — TOC Transition Note (Signed)
Transition of Care [facility]) - CM/SW Discharge Note   Patient Details  Name: Jonathan Keith MRN: [id number] Date of Birth: 06-14-1956  [facility]) CM/SW Contact:  Carles Collet, RN Phone Number: 10/25/2022, 10:34 AM   Clinical Narrative:     Notified [facility] that will DC today and will resume home health services established prior to admission. No other TOC needs identified.   Final next level of care: Home w Home Health Services Barriers to Discharge: No Barriers Identified   Patient Goals and CMS Choice CMS Medicare.gov Compare Post Acute Care list provided to:: Patient Represenative (must comment) (wife present at bedside) Choice offered to / list presented to : Spouse  Discharge Placement                         Discharge Plan and Services Additional resources added to the After Visit Summary for     Discharge Planning Services: CM Consult Post Acute Care Choice: Home Health          DME Arranged: N/A         HH Arranged: RN [place] Agency: [place] ([facility]) Date HH Agency Contacted: 10/25/22 Time [place]: 1034 Representative spoke with at [place]: [place] ([place]) Interventions SDOH Screenings   Food Insecurity: No Food Insecurity (10/22/2022)  Housing: Low Risk  (10/22/2022)  Transportation Needs: No Transportation Needs (10/22/2022)  Utilities: Not At Risk (10/22/2022)  Alcohol Screen: Low Risk  (03/07/2021)  Depression (PHQ2-9): Low Risk  (03/07/2021)  Financial Resource Strain: Medium Risk (03/07/2021)  Physical Activity: Insufficiently Active (03/07/2021)  Social Connections: Moderately Integrated (03/07/2021)  Stress: No Stress Concern Present (03/07/2021)  Tobacco Use: High Risk (10/24/2022)     Readmission Risk Interventions    10/23/2022    2:35 PM 11/28/2021    2:06 PM 05/27/2021   12:06 PM  Readmission Risk Prevention Plan  Transportation Screening Complete Complete Complete  PCP  or Specialist Appt within 3-5 Days Complete    Home Care Screening   Complete  Medication Review (RN CM)   Complete  HRI or Home Care Consult Complete    Social Work Consult for [place] Planning/Counseling Complete    Palliative Care Screening Complete    Medication Review Press photographer) Complete Complete   PCP or Specialist appointment within 3-5 days of discharge  Not Complete   HRI or Home Care Consult  Complete   SW Recovery Care/Counseling Consult  Complete   Palliative Care Screening  Not Complete   [place]  Not Complete

## 2022-10-25 NOTE — Plan of Care (Signed)

## 2022-10-26 ENCOUNTER — Telehealth: Payer: Self-pay

## 2022-10-26 ENCOUNTER — Inpatient Hospital Stay: Payer: Medicare Other

## 2022-10-26 VITALS — BP 93/61 | HR 84 | Temp 96.2°F | Resp 20

## 2022-10-26 DIAGNOSIS — R140 Abdominal distension (gaseous): Secondary | ICD-10-CM | POA: Diagnosis not present

## 2022-10-26 DIAGNOSIS — Z5986 Financial insecurity: Secondary | ICD-10-CM | POA: Diagnosis not present

## 2022-10-26 DIAGNOSIS — C221 Intrahepatic bile duct carcinoma: Secondary | ICD-10-CM | POA: Diagnosis present

## 2022-10-26 DIAGNOSIS — F17210 Nicotine dependence, cigarettes, uncomplicated: Secondary | ICD-10-CM | POA: Diagnosis not present

## 2022-10-26 DIAGNOSIS — Z8249 Family history of ischemic heart disease and other diseases of the circulatory system: Secondary | ICD-10-CM | POA: Diagnosis not present

## 2022-10-26 DIAGNOSIS — C786 Secondary malignant neoplasm of retroperitoneum and peritoneum: Secondary | ICD-10-CM | POA: Diagnosis not present

## 2022-10-26 DIAGNOSIS — Z452 Encounter for adjustment and management of vascular access device: Secondary | ICD-10-CM

## 2022-10-26 DIAGNOSIS — Z79899 Other long term (current) drug therapy: Secondary | ICD-10-CM | POA: Diagnosis not present

## 2022-10-26 DIAGNOSIS — R180 Malignant ascites: Secondary | ICD-10-CM | POA: Diagnosis not present

## 2022-10-26 DIAGNOSIS — I2510 Atherosclerotic heart disease of native coronary artery without angina pectoris: Secondary | ICD-10-CM | POA: Diagnosis not present

## 2022-10-26 DIAGNOSIS — I959 Hypotension, unspecified: Secondary | ICD-10-CM | POA: Diagnosis not present

## 2022-10-26 MED ORDER — HEPARIN SOD (PORK) LOCK FLUSH 100 UNIT/ML IV SOLN
500.0000 [IU] | Freq: Once | INTRAVENOUS | Status: AC
  Administered 2022-10-26: 500 [IU] via INTRAVENOUS

## 2022-10-26 MED ORDER — SODIUM CHLORIDE 0.9% FLUSH
10.0000 mL | INTRAVENOUS | Status: DC | PRN
  Administered 2022-10-26: 10 mL via INTRAVENOUS

## 2022-10-26 NOTE — Progress Notes (Signed)
Jonathan Keith presented for PICC line flush. PICC line located right upper arm . Good blood return present. PICC line flushed with 54ml NS and 250U/Heparin. Procedure without incident. Patient tolerated procedure well.  Dressing changed per orders. See flowsheet for details.   Vitals stable and discharged home from clinic via wheelchair. Follow up as scheduled.

## 2022-10-26 NOTE — Telephone Encounter (Signed)
Return call to patient with no answer, left vm for return call 

## 2022-10-26 NOTE — Patient Instructions (Signed)
[facility]  Discharge Instructions: Thank you for choosing [facility] to provide your oncology and hematology care.  If you have a lab appointment with the [facility], please come in thru the Main Entrance and check in at the main information desk.  Wear comfortable clothing and clothing appropriate for easy access to any Portacath or PICC line.   We strive to give you quality time with your provider. You may need to reschedule your appointment if you arrive late (15 or more minutes).  Arriving late affects you and other patients whose appointments are after yours.  Also, if you miss three or more appointments without notifying the office, you may be dismissed from the clinic at the provider's discretion.      For prescription refill requests, have your pharmacy contact our office and allow 72 hours for refills to be completed.    Today you had your picc line flushed and dressing changed.    To help prevent nausea and vomiting after your treatment, we encourage you to take your nausea medication as directed.  BELOW ARE SYMPTOMS THAT SHOULD BE REPORTED IMMEDIATELY: *FEVER GREATER THAN 100.4 F (38 C) OR HIGHER *CHILLS OR SWEATING *NAUSEA AND VOMITING THAT IS NOT CONTROLLED WITH YOUR NAUSEA MEDICATION *UNUSUAL SHORTNESS OF BREATH *UNUSUAL BRUISING OR BLEEDING *URINARY PROBLEMS (pain or burning when urinating, or frequent urination) *BOWEL PROBLEMS (unusual diarrhea, constipation, pain near the anus) TENDERNESS IN MOUTH AND THROAT WITH OR WITHOUT PRESENCE OF ULCERS (sore throat, sores in mouth, or a toothache) UNUSUAL RASH, SWELLING OR PAIN  UNUSUAL VAGINAL DISCHARGE OR ITCHING   Items with * indicate a potential emergency and should be followed up as soon as possible or go to the Emergency Department if any problems should occur.  Please show the CHEMOTHERAPY ALERT CARD or IMMUNOTHERAPY ALERT CARD at check-in to the Emergency Department and triage  nurse.  Should you have questions after your visit or need to cancel or reschedule your appointment, please contact [facility] [phone]  and follow the prompts.  Office hours are 8:00 a.m. to 4:30 p.m. Monday - Friday. Please note that voicemails left after 4:00 p.m. may not be returned until the following business day.  We are closed weekends and major holidays. You have access to a nurse at all times for urgent questions. Please call the main number to the clinic [phone] and follow the prompts.  For any non-urgent questions, you may also contact your provider using MyChart. We now offer e-Visits for anyone 28 and older to request care online for non-urgent symptoms. For details visit [email or web address].   Also download the MyChart app! Go to the app store, search "MyChart", open the app, select [facility], and log in with your MyChart username and password.

## 2022-10-27 ENCOUNTER — Ambulatory Visit: Payer: Self-pay | Admitting: *Deleted

## 2022-10-28 ENCOUNTER — Ambulatory Visit (INDEPENDENT_AMBULATORY_CARE_PROVIDER_SITE_OTHER): Payer: Medicare Other | Admitting: Urology

## 2022-10-28 VITALS — BP 91/56 | HR 97

## 2022-10-28 DIAGNOSIS — N210 Calculus in bladder: Secondary | ICD-10-CM

## 2022-10-28 DIAGNOSIS — N401 Enlarged prostate with lower urinary tract symptoms: Secondary | ICD-10-CM | POA: Diagnosis not present

## 2022-10-28 DIAGNOSIS — N138 Other obstructive and reflux uropathy: Secondary | ICD-10-CM

## 2022-10-28 DIAGNOSIS — R310 Gross hematuria: Secondary | ICD-10-CM

## 2022-10-28 LAB — URINALYSIS, ROUTINE W REFLEX MICROSCOPIC
Bilirubin, UA: NEGATIVE
Glucose, UA: NEGATIVE
Ketones, UA: NEGATIVE
Leukocytes,UA: NEGATIVE
Nitrite, UA: NEGATIVE
Protein,UA: NEGATIVE
RBC, UA: NEGATIVE
Specific Gravity, UA: 1.015 (ref 1.005–1.030)
Urobilinogen, Ur: 1 mg/dL (ref 0.2–1.0)
pH, UA: 5.5 (ref 5.0–7.5)

## 2022-10-28 NOTE — H&P (View-Only) (Signed)
 10/28/2022 11:24 AM   Jonathan Keith 04/06/1956 [id number]  Referring provider: Fusco, Lawrence, MD [address] ,  [place] [address]  Followup bladder calculus   HPI: Mr Jonathan Keith is a 66yo here for followup for BPH with a bladder calculus. He is currently on the schedule for cystolithalopaxy on 4/11. IPSS 26 QOL 5 on uroxatral 10mg. He has straining to urinate and a weak urinary stream. He has decreased urine output even with taking lasix. PVR 300cc.    PMH:\ Past Medical History:  Diagnosis Date   Cancer (HCC)    CHF (congestive heart failure) (HCC)    Chronic pain    neck, back, knees   Class 1 obesity due to excess calories with body mass index (BMI) of 31.0 to 31.9 in adult    Claudication (HCC)    Coronary artery disease    DDD (degenerative disc disease), cervical    Diabetes mellitus (HCC)    Dyslipidemia    Hypertension    Port-A-Cath in place 07/20/2021   Tobacco dependence     Surgical History: Past Surgical History:  Procedure Laterality Date   BILIARY BRUSHING N/A 02/25/2021   Procedure: BILIARY BRUSHING;  Surgeon: Rehman, Najeeb U, MD;  Location: [facility];  Service: Gastroenterology;  Laterality: N/A;   BILIARY DILATION  10/23/2022   Procedure: BILIARY DILATION;  Surgeon: Mansouraty, Gabriel Jr., MD;  Location: [facility];  Service: Gastroenterology;;   BILIARY STENT PLACEMENT N/A 02/25/2021   Procedure: BILIARY STENT PLACEMENT;  Surgeon: Rehman, Najeeb U, MD;  Location: [facility];  Service: Gastroenterology;  Laterality: N/A;   BILIARY STENT PLACEMENT  02/27/2021   Procedure: BILIARY STENT PLACEMENT (10FR x 9cm) IN THE RIGHT SYSTEM;  Surgeon: Rehman, Najeeb U, MD;  Location: [facility];  Service: Gastroenterology;;   BILIARY STENT PLACEMENT  10/23/2022   Procedure: BILIARY STENT PLACEMENT;  Surgeon: Mansouraty, Gabriel Jr., MD;  Location: [facility];  Service: Gastroenterology;;   BREAST SURGERY Left    benign lump- in his 20s.   CARDIAC  CATHETERIZATION     ERCP N/A 02/25/2021   Procedure: ENDOSCOPIC RETROGRADE CHOLANGIOPANCREATOGRAPHY (ERCP);  Surgeon: Rehman, Najeeb U, MD;  Location: [facility];  Service: Gastroenterology;  Laterality: N/A;   ERCP N/A 02/27/2021   Procedure: ENDOSCOPIC RETROGRADE CHOLANGIOPANCREATOGRAPHY (ERCP);  Surgeon: Rehman, Najeeb U, MD;  Location: [facility];  Service: Gastroenterology;  Laterality: N/A;   ERCP N/A 10/23/2022   Procedure: ENDOSCOPIC RETROGRADE CHOLANGIOPANCREATOGRAPHY (ERCP);  Surgeon: Mansouraty, Gabriel Jr., MD;  Location: [facility];  Service: Gastroenterology;  Laterality: N/A;   IR IMAGING GUIDED PORT INSERTION  07/21/2021   IR REMOVAL TUN ACCESS W/ PORT W/O FL MOD SED  09/30/2022   KNEE SURGERY Left    x2   LEFT HEART CATH AND CORONARY ANGIOGRAPHY N/A 09/08/2021   Procedure: LEFT HEART CATH AND CORONARY ANGIOGRAPHY;  Surgeon: Thukkani, Arun K, MD;  Location: [facility];  Service: Cardiovascular;  Laterality: N/A;   REMOVAL OF STONES  10/23/2022   Procedure: REMOVAL OF STONES;  Surgeon: Mansouraty, Gabriel Jr., MD;  Location: [facility];  Service: Gastroenterology;;   SPHINCTEROTOMY N/A 02/25/2021   Procedure: SPHINCTEROTOMY;  Surgeon: Rehman, Najeeb U, MD;  Location: [facility];  Service: Gastroenterology;  Laterality: N/A;   STENT REMOVAL  02/27/2021   Procedure: STENT REMOVAL (8.5Fr x 9cm);  Surgeon: Rehman, Najeeb U, MD;  Location: [facility];  Service: Gastroenterology;;    Home Medications:  Allergies as of 10/28/2022   No Known Allergies        Medication List        Accurate as of October 28, 2022 11:24 AM. If you have any questions, ask your nurse or doctor.          acetaminophen 500 MG tablet Commonly known as: TYLENOL Take 500 mg by mouth every 6 (six) hours as needed for moderate pain or headache.   albuterol 108 (90 Base) MCG/ACT inhaler Commonly known as: VENTOLIN HFA Inhale 2 puffs into the lungs every 6 (six) hours as needed for wheezing or shortness of  breath.   alfuzosin 10 MG 24 hr tablet Commonly known as: UROXATRAL Take 1 tablet (10 mg total) by mouth at bedtime.   atorvastatin 40 MG tablet Commonly known as: LIPITOR Take 1 tablet (40 mg total) by mouth daily at 6 PM.   carvedilol 3.125 MG tablet Commonly known as: COREG Take 1 tablet (3.125 mg total) by mouth 2 (two) times daily with a meal.   ciprofloxacin 500 MG tablet Commonly known as: CIPRO Take 1 tablet (500 mg total) by mouth 2 (two) times daily for 6 doses.   CISPLATIN IV Inject into the vein once a week. Days 1, 8 every 21 days   famotidine 20 MG tablet Commonly known as: PEPCID Take 20 mg by mouth 2 (two) times daily.   ferrous sulfate 325 (65 FE) MG tablet Commonly known as: FerrouSul Take 1 tablet (325 mg total) by mouth daily with breakfast.   furosemide 20 MG tablet Commonly known as: LASIX Take 20 mg by mouth as needed for fluid or edema. If he gains weight five pounds when taking chemo, take one tab a day till he is back to normal weight or within 2 pounds for fluid check before surgery.   GEMZAR IV Inject into the vein once a week. Days 1, 8 every 21 days   Ginseng 100 MG Caps Take 1 capsule by mouth daily.   IMFINZI IV Inject into the vein every 21 ( twenty-one) days.   Intrasite Gel Applipak Gel Apply 1 Units topically daily.   lisinopril 2.5 MG tablet Commonly known as: ZESTRIL Take 1 tablet (2.5 mg total) by mouth daily.   metFORMIN 500 MG tablet Commonly known as: GLUCOPHAGE Take 500 mg by mouth 2 (two) times daily with a meal.   metFORMIN 500 MG tablet Commonly known as: GLUCOPHAGE Take 1 tablet (500 mg total) by mouth 2 (two) times daily with a meal.   Misc. Devices Kit 1 Box by Does not apply route every 7 (seven) days.   nitroGLYCERIN 0.4 MG SL tablet Commonly known as: Nitrostat Place 1 tablet (0.4 mg total) under the tongue every 5 (five) minutes as needed for chest pain.   pantoprazole 40 MG tablet Commonly known as:  PROTONIX Take 1 tablet (40 mg total) by mouth daily.   prochlorperazine 10 MG tablet Commonly known as: COMPAZINE TAKE (1) TABLET BY MOUTH EVERY (6) HOURS AS NEEDED. What changed: See the new instructions.   sodium chloride flush 0.9 % Soln Commonly known as: NS 10 mLs by Intracatheter route every 7 (seven) days. What changed: when to take this   tamsulosin 0.4 MG Caps capsule Commonly known as: FLOMAX TAKE (2) CAPSULES BY MOUTH AT BEDTIME. What changed: See the new instructions.   Vitamin D (Ergocalciferol) 1.25 MG (50000 UNIT) Caps capsule Commonly known as: DRISDOL Take 1 capsule by mouth once a week.        Allergies: No Known Allergies  Family History: Family History  Problem Relation   Age of Onset   CAD Mother    Cancer Neg Hx     Social History:  reports that he has been smoking cigarettes. He has a 28.00 pack-year smoking history. He has never used smokeless tobacco. He reports that he does not currently use alcohol. He reports that he does not use drugs.  ROS: All other review of systems were reviewed and are negative except what is noted above in HPI  Physical Exam: BP (!) 91/56   Pulse 97   Constitutional:  Alert and oriented, No acute distress. HEENT: [place] AT, moist mucus membranes.  Trachea midline, no masses. Cardiovascular: No clubbing, cyanosis, or edema. Respiratory: Normal respiratory effort, no increased work of breathing. GI: Abdomen is soft, nontender, nondistended, no abdominal masses GU: No CVA tenderness.  Lymph: No cervical or inguinal lymphadenopathy. Skin: No rashes, bruises or suspicious lesions. Neurologic: Grossly intact, no focal deficits, moving all 4 extremities. Psychiatric: Normal mood and affect.  Laboratory Data: Lab Results  Component Value Date   WBC 4.4 10/25/2022   HGB 8.5 (L) 10/25/2022   HCT 25.6 (L) 10/25/2022   MCV 98.5 10/25/2022   PLT 111 (L) 10/25/2022    Lab Results  Component Value Date   CREATININE 1.49  (H) 10/25/2022    No results found for: "PSA"  No results found for: "TESTOSTERONE"  Lab Results  Component Value Date   HGBA1C 5.8 (H) 09/08/2021    Urinalysis    Component Value Date/Time   COLORURINE AMBER (A) 10/22/2022 0049   APPEARANCEUR CLEAR 10/22/2022 0049   APPEARANCEUR Clear 09/21/2022 1306   LABSPEC 1.017 10/22/2022 0049   PHURINE 5.0 10/22/2022 0049   GLUCOSEU NEGATIVE 10/22/2022 0049   HGBUR NEGATIVE 10/22/2022 0049   BILIRUBINUR NEGATIVE 10/22/2022 0049   BILIRUBINUR Negative 09/21/2022 1306   KETONESUR NEGATIVE 10/22/2022 0049   PROTEINUR 30 (A) 10/22/2022 0049   NITRITE NEGATIVE 10/22/2022 0049   LEUKOCYTESUR NEGATIVE 10/22/2022 0049    Lab Results  Component Value Date   LABMICR Comment 09/21/2022   WBCUA None seen 08/21/2022   LABEPIT None seen 08/21/2022   BACTERIA RARE (A) 10/22/2022    Pertinent Imaging: *** No results found for this or any previous visit.  Results for orders placed during the hospital encounter of 10/10/20  US Venous Img Lower Bilateral (DVT)  Narrative CLINICAL DATA:  Bilateral lower extremity pain. History of smoking. Evaluate for DVT.  EXAM: BILATERAL LOWER EXTREMITY VENOUS DOPPLER ULTRASOUND  TECHNIQUE: Gray-scale sonography with graded compression, as well as color Doppler and duplex ultrasound were performed to evaluate the lower extremity deep venous systems from the level of the common femoral vein and including the common femoral, femoral, profunda femoral, popliteal and calf veins including the posterior tibial, peroneal and gastrocnemius veins when visible. The superficial great saphenous vein was also interrogated. Spectral Doppler was utilized to evaluate flow at rest and with distal augmentation maneuvers in the common femoral, femoral and popliteal veins.  COMPARISON:  None.  FINDINGS: RIGHT LOWER EXTREMITY  Common Femoral Vein: No evidence of thrombus. Normal compressibility, respiratory  phasicity and response to augmentation.  Saphenofemoral Junction: No evidence of thrombus. Normal compressibility and flow on color Doppler imaging.  Profunda Femoral Vein: No evidence of thrombus. Normal compressibility and flow on color Doppler imaging.  Femoral Vein: No evidence of thrombus. Normal compressibility, respiratory phasicity and response to augmentation.  Popliteal Vein: No evidence of thrombus. Normal compressibility, respiratory phasicity and response to augmentation.  Calf Veins: No evidence of   thrombus. Normal compressibility and flow on color Doppler imaging.  Superficial Great Saphenous Vein: No evidence of thrombus. Normal compressibility.  Venous Reflux:  None.  Other Findings:  None.  LEFT LOWER EXTREMITY  Common Femoral Vein: No evidence of thrombus. Normal compressibility, respiratory phasicity and response to augmentation.  Saphenofemoral Junction: No evidence of thrombus. Normal compressibility and flow on color Doppler imaging.  Profunda Femoral Vein: No evidence of thrombus. Normal compressibility and flow on color Doppler imaging.  Femoral Vein: No evidence of thrombus. Normal compressibility, respiratory phasicity and response to augmentation.  Popliteal Vein: No evidence of thrombus. Normal compressibility, respiratory phasicity and response to augmentation.  Calf Veins: No evidence of thrombus. Normal compressibility and flow on color Doppler imaging.  Superficial Great Saphenous Vein: No evidence of thrombus. Normal compressibility.  Venous Reflux:  None.  Other Findings:  None.  IMPRESSION: No evidence of DVT within either lower extremity.   Electronically Signed By: John  Watts M.D. On: 10/10/2020 10:45  No results found for this or any previous visit.  No results found for this or any previous visit.  No results found for this or any previous visit.  No valid procedures specified. No results found for this or any  previous visit.  No results found for this or any previous visit.   Assessment & Plan:    1. Gross hematuria *** - Urinalysis, Routine w reflex microscopic  2. Benign prostatic hyperplasia with urinary obstruction ***  3. Calculus of bladder ***   No follow-ups on file.  Vaeda Westall, MD  [facility] [place]   

## 2022-10-28 NOTE — Progress Notes (Signed)
10/28/2022 11:24 AM   Jonathan Keith 02/19/1956 [id number]  Referring provider: Elfredia Nevins, MD [address] [place],  [place] [address]  Followup bladder calculus   HPI: Jonathan Keith is a 19JY here for followup for BPH with a bladder calculus. He is currently on the schedule for cystolithalopaxy on 4/11. IPSS 26 QOL 5 on uroxatral 10mg . He has straining to urinate and a weak urinary stream. He has decreased urine output even with taking lasix. PVR 300cc.    PMH:\ Past Medical History:  Diagnosis Date   Cancer (HCC)    CHF (congestive heart failure) (HCC)    Chronic pain    neck, back, knees   Class 1 obesity due to excess calories with body mass index (BMI) of 31.0 to 31.9 in adult    Claudication [facility])    Coronary artery disease    DDD (degenerative disc disease), cervical    Diabetes mellitus (HCC)    Dyslipidemia    Hypertension    Port-A-Cath in place 07/20/2021   Tobacco dependence     Surgical History: Past Surgical History:  Procedure Laterality Date   BILIARY BRUSHING N/A 02/25/2021   Procedure: BILIARY BRUSHING;  Surgeon: Malissa Hippo, MD;  Location: [facility];  Service: Gastroenterology;  Laterality: N/A;   BILIARY DILATION  10/23/2022   Procedure: BILIARY DILATION;  Surgeon: Meridee Score Netty Starring., MD;  Location: [facility] ENDOSCOPY;  Service: Gastroenterology;;   BILIARY STENT PLACEMENT N/A 02/25/2021   Procedure: BILIARY STENT PLACEMENT;  Surgeon: Malissa Hippo, MD;  Location: [facility];  Service: Gastroenterology;  Laterality: N/A;   BILIARY STENT PLACEMENT  02/27/2021   Procedure: BILIARY STENT PLACEMENT (10FR x 9cm) IN THE RIGHT SYSTEM;  Surgeon: Malissa Hippo, MD;  Location: [facility];  Service: Gastroenterology;;   BILIARY STENT PLACEMENT  10/23/2022   Procedure: BILIARY STENT PLACEMENT;  Surgeon: Lemar Lofty., MD;  Location: [facility]COPY;  Service: Gastroenterology;;   BREAST SURGERY Left    benign lump- in his 55s.   CARDIAC  CATHETERIZATION     ERCP N/A 02/25/2021   Procedure: ENDOSCOPIC RETROGRADE CHOLANGIOPANCREATOGRAPHY (ERCP);  Surgeon: Malissa Hippo, MD;  Location: [facility];  Service: Gastroenterology;  Laterality: N/A;   ERCP N/A 02/27/2021   Procedure: ENDOSCOPIC RETROGRADE CHOLANGIOPANCREATOGRAPHY (ERCP);  Surgeon: Malissa Hippo, MD;  Location: [facility];  Service: Gastroenterology;  Laterality: N/A;   ERCP N/A 10/23/2022   Procedure: ENDOSCOPIC RETROGRADE CHOLANGIOPANCREATOGRAPHY (ERCP);  Surgeon: Lemar Lofty., MD;  Location: [facility] ENDOSCOPY;  Service: Gastroenterology;  Laterality: N/A;   IR IMAGING GUIDED PORT INSERTION  07/21/2021   IR REMOVAL TUN ACCESS W/ PORT W/O FL MOD SED  09/30/2022   KNEE SURGERY Left    x2   LEFT HEART CATH AND CORONARY ANGIOGRAPHY N/A 09/08/2021   Procedure: LEFT HEART CATH AND CORONARY ANGIOGRAPHY;  Surgeon: Orbie Pyo, MD;  Location: [facility] INVASIVE CV LAB;  Service: Cardiovascular;  Laterality: N/A;   REMOVAL OF STONES  10/23/2022   Procedure: REMOVAL OF STONES;  Surgeon: Meridee Score Netty Starring., MD;  Location: [facility] ENDOSCOPY;  Service: Gastroenterology;;   SPHINCTEROTOMY N/A 02/25/2021   Procedure: Dennison Mascot;  Surgeon: Malissa Hippo, MD;  Location: [facility];  Service: Gastroenterology;  Laterality: N/A;   STENT REMOVAL  02/27/2021   Procedure: STENT REMOVAL (8.5Fr x 9cm);  Surgeon: Malissa Hippo, MD;  Location: [facility];  Service: Gastroenterology;;    Home Medications:  Allergies as of 10/28/2022   No Known Allergies  Medication List        Accurate as of October 28, 2022 11:24 AM. If you have any questions, ask your nurse or doctor.          acetaminophen 500 MG tablet Commonly known as: TYLENOL Take 500 mg by mouth every 6 (six) hours as needed for moderate pain or headache.   albuterol 108 (90 Base) MCG/ACT inhaler Commonly known as: VENTOLIN HFA Inhale 2 puffs into the lungs every 6 (six) hours as needed for wheezing or shortness of  breath.   alfuzosin 10 MG 24 hr tablet Commonly known as: UROXATRAL Take 1 tablet (10 mg total) by mouth at bedtime.   atorvastatin 40 MG tablet Commonly known as: LIPITOR Take 1 tablet (40 mg total) by mouth daily at 6 PM.   carvedilol 3.125 MG tablet Commonly known as: COREG Take 1 tablet (3.125 mg total) by mouth 2 (two) times daily with a meal.   ciprofloxacin 500 MG tablet Commonly known as: CIPRO Take 1 tablet (500 mg total) by mouth 2 (two) times daily for 6 doses.   CISPLATIN IV Inject into the vein once a week. Days 1, 8 every 21 days   famotidine 20 MG tablet Commonly known as: PEPCID Take 20 mg by mouth 2 (two) times daily.   ferrous sulfate 325 (65 FE) MG tablet Commonly known as: FerrouSul Take 1 tablet (325 mg total) by mouth daily with breakfast.   furosemide 20 MG tablet Commonly known as: LASIX Take 20 mg by mouth as needed for fluid or edema. If he gains weight five pounds when taking chemo, take one tab a day till he is back to normal weight or within 2 pounds for fluid check before surgery.   GEMZAR IV Inject into the vein once a week. Days 1, 8 every 21 days   Ginseng 100 MG Caps Take 1 capsule by mouth daily.   IMFINZI IV Inject into the vein every 21 ( twenty-one) days.   Intrasite Gel Applipak Gel Apply 1 Units topically daily.   lisinopril 2.5 MG tablet Commonly known as: ZESTRIL Take 1 tablet (2.5 mg total) by mouth daily.   metFORMIN 500 MG tablet Commonly known as: GLUCOPHAGE Take 500 mg by mouth 2 (two) times daily with a meal.   metFORMIN 500 MG tablet Commonly known as: GLUCOPHAGE Take 1 tablet (500 mg total) by mouth 2 (two) times daily with a meal.   Misc. Devices Kit 1 Box by Does not apply route every 7 (seven) days.   nitroGLYCERIN 0.4 MG SL tablet Commonly known as: Nitrostat Place 1 tablet (0.4 mg total) under the tongue every 5 (five) minutes as needed for chest pain.   pantoprazole 40 MG tablet Commonly known as:  PROTONIX Take 1 tablet (40 mg total) by mouth daily.   prochlorperazine 10 MG tablet Commonly known as: COMPAZINE TAKE (1) TABLET BY MOUTH EVERY (6) HOURS AS NEEDED. What changed: See the new instructions.   sodium chloride flush 0.9 % Soln Commonly known as: NS 10 mLs by Intracatheter route every 7 (seven) days. What changed: when to take this   tamsulosin 0.4 MG Caps capsule Commonly known as: FLOMAX TAKE (2) CAPSULES BY MOUTH AT BEDTIME. What changed: See the new instructions.   Vitamin D (Ergocalciferol) 1.25 MG (50000 UNIT) Caps capsule Commonly known as: DRISDOL Take 1 capsule by mouth once a week.        Allergies: No Known Allergies  Family History: Family History  Problem Relation  Age of Onset   CAD Mother    Cancer Neg Hx     Social History:  reports that he has been smoking cigarettes. He has a 28.00 pack-year smoking history. He has never used smokeless tobacco. He reports that he does not currently use alcohol. He reports that he does not use drugs.  ROS: All other review of systems were reviewed and are negative except what is noted above in HPI  Physical Exam: BP (!) 91/56   Pulse 97   Constitutional:  Alert and oriented, No acute distress. HEENT: [place] AT, moist mucus membranes.  Trachea midline, no masses. Cardiovascular: No clubbing, cyanosis, or edema. Respiratory: Normal respiratory effort, no increased work of breathing. GI: Abdomen is soft, nontender, nondistended, no abdominal masses GU: No CVA tenderness.  Lymph: No cervical or inguinal lymphadenopathy. Skin: No rashes, bruises or suspicious lesions. Neurologic: Grossly intact, no focal deficits, moving all 4 extremities. Psychiatric: Normal mood and affect.  Laboratory Data: Lab Results  Component Value Date   WBC 4.4 10/25/2022   HGB 8.5 (L) 10/25/2022   HCT 25.6 (L) 10/25/2022   MCV 98.5 10/25/2022   PLT 111 (L) 10/25/2022    Lab Results  Component Value Date   CREATININE 1.49  (H) 10/25/2022    No results found for: "PSA"  No results found for: "TESTOSTERONE"  Lab Results  Component Value Date   HGBA1C 5.8 (H) 09/08/2021    Urinalysis    Component Value Date/Time   COLORURINE AMBER (A) 10/22/2022 0049   APPEARANCEUR CLEAR 10/22/2022 0049   APPEARANCEUR Clear 09/21/2022 1306   LABSPEC 1.017 10/22/2022 0049   PHURINE 5.0 10/22/2022 0049   GLUCOSEU NEGATIVE 10/22/2022 0049   HGBUR NEGATIVE 10/22/2022 0049   BILIRUBINUR NEGATIVE 10/22/2022 0049   BILIRUBINUR Negative 09/21/2022 1306   KETONESUR NEGATIVE 10/22/2022 0049   PROTEINUR 30 (A) 10/22/2022 0049   NITRITE NEGATIVE 10/22/2022 0049   LEUKOCYTESUR NEGATIVE 10/22/2022 0049    Lab Results  Component Value Date   LABMICR Comment 09/21/2022   WBCUA None seen 08/21/2022   LABEPIT None seen 08/21/2022   BACTERIA RARE (A) 10/22/2022    Pertinent Imaging: *** No results found for this or any previous visit.  Results for orders placed during the hospital encounter of 10/10/20  US Venous Img Lower Bilateral (DVT)  Narrative CLINICAL DATA:  Bilateral lower extremity pain. History of smoking. Evaluate for DVT.  EXAM: BILATERAL LOWER EXTREMITY VENOUS DOPPLER ULTRASOUND  TECHNIQUE: Gray-scale sonography with graded compression, as well as color Doppler and duplex ultrasound were performed to evaluate the lower extremity deep venous systems from the level of the common femoral vein and including the common femoral, femoral, profunda femoral, popliteal and calf veins including the posterior tibial, peroneal and gastrocnemius veins when visible. The superficial great saphenous vein was also interrogated. Spectral Doppler was utilized to evaluate flow at rest and with distal augmentation maneuvers in the common femoral, femoral and popliteal veins.  COMPARISON:  None.  FINDINGS: RIGHT LOWER EXTREMITY  Common Femoral Vein: No evidence of thrombus. Normal compressibility, respiratory  phasicity and response to augmentation.  Saphenofemoral Junction: No evidence of thrombus. Normal compressibility and flow on color Doppler imaging.  Profunda Femoral Vein: No evidence of thrombus. Normal compressibility and flow on color Doppler imaging.  Femoral Vein: No evidence of thrombus. Normal compressibility, respiratory phasicity and response to augmentation.  Popliteal Vein: No evidence of thrombus. Normal compressibility, respiratory phasicity and response to augmentation.  Calf Veins: No evidence of  thrombus. Normal compressibility and flow on color Doppler imaging.  Superficial Great Saphenous Vein: No evidence of thrombus. Normal compressibility.  Venous Reflux:  None.  Other Findings:  None.  LEFT LOWER EXTREMITY  Common Femoral Vein: No evidence of thrombus. Normal compressibility, respiratory phasicity and response to augmentation.  Saphenofemoral Junction: No evidence of thrombus. Normal compressibility and flow on color Doppler imaging.  Profunda Femoral Vein: No evidence of thrombus. Normal compressibility and flow on color Doppler imaging.  Femoral Vein: No evidence of thrombus. Normal compressibility, respiratory phasicity and response to augmentation.  Popliteal Vein: No evidence of thrombus. Normal compressibility, respiratory phasicity and response to augmentation.  Calf Veins: No evidence of thrombus. Normal compressibility and flow on color Doppler imaging.  Superficial Great Saphenous Vein: No evidence of thrombus. Normal compressibility.  Venous Reflux:  None.  Other Findings:  None.  IMPRESSION: No evidence of DVT within either lower extremity.   Electronically Signed By: Simonne Come M.D. On: 10/10/2020 10:45  No results found for this or any previous visit.  No results found for this or any previous visit.  No results found for this or any previous visit.  No valid procedures specified. No results found for this or any  previous visit.  No results found for this or any previous visit.   Assessment & Plan:    1. Gross hematuria *** - Urinalysis, Routine w reflex microscopic  2. Benign prostatic hyperplasia with urinary obstruction ***  3. Calculus of bladder ***   No follow-ups on file.  Wilkie Aye, MD  [facility] [place]

## 2022-10-28 NOTE — Progress Notes (Addendum)
Simple Catheter Placement  Due to urinary retention patient is present today for a foley cath placement.  Patient was cleaned and prepped in a sterile fashion with betadine. A 16 FR foley catheter was inserted, urine return was noted  200 ml, urine was dark yellow in color.  The balloon was filled with 10cc of sterile water.  A leg bag was attached for drainage. Patient was also given a night bag to take home and was given instruction on how to change from one bag to another.  Patient was given instruction on proper catheter care.  Patient tolerated well, no complications were noted   Performed by: Marisue Brooklyn, CMA  Additional notes/ Follow up: Keep scheduled appt

## 2022-10-29 ENCOUNTER — Encounter: Payer: Self-pay | Admitting: Urology

## 2022-10-29 LAB — CULTURE, BLOOD (SINGLE)
Culture: NO GROWTH
Special Requests: ADEQUATE

## 2022-10-29 NOTE — Patient Instructions (Signed)

## 2022-11-02 ENCOUNTER — Ambulatory Visit (HOSPITAL_COMMUNITY)
Admission: RE | Admit: 2022-11-02 | Discharge: 2022-11-02 | Disposition: A | Discharge status: Home / Self Care | Payer: Medicare Other | Source: Ambulatory Visit | Attending: Hematology | Admitting: Hematology

## 2022-11-02 ENCOUNTER — Inpatient Hospital Stay: Payer: Medicare Other

## 2022-11-02 ENCOUNTER — Inpatient Hospital Stay (HOSPITAL_BASED_OUTPATIENT_CLINIC_OR_DEPARTMENT_OTHER): Payer: Medicare Other | Admitting: Hematology

## 2022-11-02 ENCOUNTER — Encounter (HOSPITAL_COMMUNITY): Payer: Self-pay

## 2022-11-02 DIAGNOSIS — C221 Intrahepatic bile duct carcinoma: Secondary | ICD-10-CM | POA: Diagnosis not present

## 2022-11-02 DIAGNOSIS — Z95828 Presence of other vascular implants and grafts: Secondary | ICD-10-CM

## 2022-11-02 DIAGNOSIS — R180 Malignant ascites: Secondary | ICD-10-CM | POA: Insufficient documentation

## 2022-11-02 LAB — COMPREHENSIVE METABOLIC PANEL
ALT: 82 U/L — ABNORMAL HIGH (ref 0–44)
AST: 136 U/L — ABNORMAL HIGH (ref 15–41)
Albumin: 2.2 g/dL — ABNORMAL LOW (ref 3.5–5.0)
Alkaline Phosphatase: 353 U/L — ABNORMAL HIGH (ref 38–126)
Anion gap: 10 (ref 5–15)
BUN: 22 mg/dL (ref 8–23)
CO2: 21 mmol/L — ABNORMAL LOW (ref 22–32)
Calcium: 8 mg/dL — ABNORMAL LOW (ref 8.9–10.3)
Chloride: 98 mmol/L (ref 98–111)
Creatinine, Ser: 1.5 mg/dL — ABNORMAL HIGH (ref 0.61–1.24)
GFR, Estimated: 51 mL/min — ABNORMAL LOW (ref >60)
Glucose, Bld: 153 mg/dL — ABNORMAL HIGH (ref 70–99)
Potassium: 4.3 mmol/L (ref 3.5–5.1)
Sodium: 129 mmol/L — ABNORMAL LOW (ref 135–145)
Total Bilirubin: 2.9 mg/dL — ABNORMAL HIGH (ref 0.3–1.2)
Total Protein: 5.7 g/dL — ABNORMAL LOW (ref 6.5–8.1)

## 2022-11-02 LAB — CBC WITH DIFFERENTIAL/PLATELET
Abs Immature Granulocytes: 0.04 10*3/uL (ref 0.00–0.07)
Basophils Absolute: 0 10*3/uL (ref 0.0–0.1)
Basophils Relative: 1 %
Eosinophils Absolute: 0.2 10*3/uL (ref 0.0–0.5)
Eosinophils Relative: 3 %
HCT: 28 % — ABNORMAL LOW (ref 39.0–52.0)
Hemoglobin: 9.6 g/dL — ABNORMAL LOW (ref 13.0–17.0)
Immature Granulocytes: 1 %
Lymphocytes Relative: 12 %
Lymphs Abs: 0.9 10*3/uL (ref 0.7–4.0)
MCH: 33.4 pg (ref 26.0–34.0)
MCHC: 34.3 g/dL (ref 30.0–36.0)
MCV: 97.6 fL (ref 80.0–100.0)
Monocytes Absolute: 1 10*3/uL (ref 0.1–1.0)
Monocytes Relative: 13 %
Neutro Abs: 5.3 10*3/uL (ref 1.7–7.7)
Neutrophils Relative %: 70 %
Platelets: 122 10*3/uL — ABNORMAL LOW (ref 150–400)
RBC: 2.87 MIL/uL — ABNORMAL LOW (ref 4.22–5.81)
RDW: 15.5 % (ref 11.5–15.5)
WBC: 7.4 10*3/uL (ref 4.0–10.5)
nRBC: 0 % (ref 0.0–0.2)

## 2022-11-02 LAB — BILIRUBIN, DIRECT: Bilirubin, Direct: 1.6 mg/dL — ABNORMAL HIGH (ref 0.0–0.2)

## 2022-11-02 LAB — MAGNESIUM: Magnesium: 1.7 mg/dL (ref 1.7–2.4)

## 2022-11-02 MED ORDER — SODIUM CHLORIDE 0.9% FLUSH
10.0000 mL | INTRAVENOUS | Status: DC | PRN
  Administered 2022-11-02: 10 mL via INTRAVENOUS

## 2022-11-02 MED ORDER — HEPARIN SOD (PORK) LOCK FLUSH 100 UNIT/ML IV SOLN
250.0000 [IU] | Freq: Once | INTRAVENOUS | Status: AC
  Administered 2022-11-02: 250 [IU] via INTRAVENOUS

## 2022-11-02 NOTE — Patient Instructions (Addendum)
[facility] Discharge Instructions   You were seen and examined today by Dr. Delton Coombes.  He reviewed the results of your lab work. Your bilirubin is 2.9 today. We want it to come down further before restarting treatment.   We will try to restart treatment next week.   Return as scheduled.    Thank you for choosing [place] at [facility] to provide your oncology and hematology care.  To afford each patient quality time with our provider, please arrive at least 15 minutes before your scheduled appointment time.   If you have a lab appointment with the [facility] please come in thru the Main Entrance and check in at the main information desk.  You need to re-schedule your appointment should you arrive 10 or more minutes late.  We strive to give you quality time with our providers, and arriving late affects you and other patients whose appointments are after yours.  Also, if you no show three or more times for appointments you may be dismissed from the clinic at the providers discretion.     Again, thank you for choosing [facility].  Our hope is that these requests will decrease the amount of time that you wait before being seen by our physicians.       _____________________________________________________________  Should you have questions after your visit to [facility], please contact our office at [phone] and follow the prompts.  Our office hours are 8:00 a.m. and 4:30 p.m. Monday - Friday.  Please note that voicemails left after 4:00 p.m. may not be returned until the following business day.  We are closed weekends and major holidays.  You do have access to a nurse 24-7, just call the main number to the clinic [phone] and do not press any options, hold on the line and a nurse will answer the phone.    For prescription refill requests, have your pharmacy contact our office and allow 72  hours.    Due to Covid, you will need to wear a mask upon entering the hospital. If you do not have a mask, a mask will be given to you at the Main Entrance upon arrival. For doctor visits, patients may have 1 support person age 30 or older with them. For treatment visits, patients can not have anyone with them due to social distancing guidelines and our immunocompromised population.

## 2022-11-02 NOTE — Progress Notes (Signed)
[facility] [place] [address] [place] [address]    Clinic Day:  11/02/2022  Referring physician: Redmond School, MD  Patient Care Team: Redmond School, MD as PCP - General (Internal Medicine) Chalmers Guest, MD as PCP - Cardiology (Cardiology) Derek Jack, MD as Medical Oncologist (Medical Oncology)   ASSESSMENT & PLAN:   Assessment: Metastatic cholangiocarcinoma: - 02/2021 initial diagnosis presentation with painless jaundice - ERCP with sphincterotomy, subsequent ERCP with plastic biliary stent placement. - MRI-2 cm mass involving the common hepatic duct with upstream biliary dilatation. - 03/2021 consult at [facility] with GI and surgery-confirm diagnosis of perihilar cholangiocarcinoma - 03/21/2021-biopsy/pathology, ERCP-common hepatic duct cytology and brushings performed.  EUS-1 enlarged lymph node versus tumor implant in the gastrohepatic ligament-negative for malignancy.  Single biliary plastic stent removed and replaced with the plastic stent in the right and left ducts. - 04/17/2021-right portal vein embolization to promote left hepatic hypertrophy. - 07/02/2021-diagnostic laparoscopy with multiple areas concerning for metastatic blocks identified in the abdomen and peritoneal surfaces.  6 separate biopsies obtained. - Pathology consistent with metastatic carcinoma. - 37 pound weight loss in the last 5 months. - Cycle 1 of gemcitabine, cisplatin, durvalumab on 07/22/2021. - ERCP (10/23/2022): Prior sphincterotomy open.  Single severe biliary stricture found in the left main hepatic duct, malignant appearing.  Stricture successfully dilated and a plastic biliary stent was placed into the left hepatic duct. - CARIS Assure: T p53 pathogenic variant.  TMB-low, MSI-not detected  2.  Social history/family: - Lives at home with his wife.  He worked as a Development worker, community. - Current active smoker, 1 pack/day since age 32. - Maternal grandmother had  cancer.  Maternal uncle had brain tumor.    Plan: Metastatic cholangiocarcinoma: - He had recent hospitalization due to elevated bilirubin.  He underwent ERCP on 10/23/2022 with stent placement in the left hepatic duct. - He reports abdominal distention over the weekend. - He also had a Foley placed with leg bag on 10/28/2022. - Labs today reviewed by me shows elevated AST of 136, ALT 82, total bilirubin 2.9 and direct bilirubin 1.6. - Will hold his treatment today.  Will reevaluate him next week.   2.  Abdominal distention: - Last paracentesis on 10/19/2022 with 3.5 L removed. - Reports abdominal distention over the weekend.  Will recommend another paracentesis. - Continue Lasix 20 mg daily.  Reluctant to increase the dose because of elevated creatinine of 1.5.  3.  Left ventricular thrombus (Dx 06/04/2022):: - Coumadin discontinued by cardiology on 09/14/2022 as repeat echo did not show LV thrombus.  Continue aspirin 81 mg daily.  4.  Hypotension: - Blood pressure today is 101/62.  He is off of midodrine.  Orders Placed This Encounter  Procedures   US Paracentesis    Standing Status:   Future    Number of Occurrences:   1    Standing Expiration Date:   11/02/2023    Order Specific Question:   If therapeutic, is there a maximum amount of fluid to be removed?    Answer:   Yes    Order Specific Question:   What is the maximum amount of fluide to be removed?    Answer:   5 L    Order Specific Question:   Are labs required for specimen collection?    Answer:   No    Order Specific Question:   Is Albumin medication needed?    Answer:   Yes  Order Specific Question:   A seperate Albumin medication order is required.    Answer:   I understand I need to place a separate order for albumin    Order Specific Question:   Reason for Exam (SYMPTOM  OR DIAGNOSIS REQUIRED)    Answer:   malignant ascites    Order Specific Question:   Preferred imaging location?    Answer:   [facility]    Bilirubin, direct    I,Alexis Herring,acting as a scribe for Derek Jack, MD.,have documented all relevant documentation on the behalf of Derek Jack, MD,as directed by  Derek Jack, MD while in the presence of Derek Jack, MD.  I, Derek Jack MD, have reviewed the above documentation for accuracy and completeness, and I agree with the above.    Derek Jack, MD   3/25/202412:48 PM  CHIEF COMPLAINT:   Diagnosis: intrahepatic cholangiocarcinoma    Cancer Staging  Cholangiocarcinoma [facility]) Staging form: Perihilar Bile Ducts, AJCC 8th Edition - Clinical stage from 07/17/2021: Stage IVB (cTX, cNX, pM1) - Signed by Derek Jack, MD on 07/17/2021    Prior Therapy: None  Current Therapy:  Cisplatin + Gemcitabine D1, D15 q 28d   HISTORY OF PRESENT ILLNESS:   Oncology History  Cholangiocarcinoma ([place])  02/28/2021 Initial Diagnosis   Cholangiocarcinoma ([place])   07/17/2021 Cancer Staging   Staging form: Perihilar Bile Ducts, AJCC 8th Edition - Clinical stage from 07/17/2021: Stage IVB (cTX, cNX, pM1) - Signed by Derek Jack, MD on 07/17/2021 Stage prefix: Initial diagnosis   07/22/2021 - 04/01/2022 Chemotherapy   Patient is on Treatment Plan : BILIARY TRACT Cisplatin + Gemcitabine D1,15 + Imfinzi day 1 q28d     07/22/2021 -  Chemotherapy   Patient is on Treatment Plan : BILIARY TRACT IMFINZI D1  +  Cisplatin + Gemcitabine  D1, 15 q28d        INTERVAL HISTORY:   Jonathan Keith is a 67 y.o. male seen for follow-up of cholangiocarcinoma and toxicity management prior to chemotherapy. He was last seen by me on 10/19/22.  Patient was admitted to the hospital on 10/22/22 for x4 days for biliary obstruction. He had an ERCP on 10/23/22.  He was seen by urology Dr. Nicolette Bang on 10/28/22. He reports abdominal distension at that time with accompanying difficulty urinating. Dr. Alyson Ingles placed a foley catheter at that time which is still  in place and functioning well. His wife states that she empties the bag x4-5 times per day but it is not full when she empties it. He is still using Lasix 20mg  daily.  He states that his abdominal distension began after receiving IV fluids in the hospital. He has not had a paracentesis since 10/19/22 with 3.L removed at that time. His bilirubin is 2.9 today. His PICC is still in place. His wife states that his home health nurse has been flushing his port, with the last flush done on 10/29/22.  His appetite level is at 0%. He states that he cannot eat much when his abdominal distension worsens. He is using Compazine TID. He reports nausea/vomiting with eating some foods despite Compazine use. His energy level is at 0%.  PAST MEDICAL HISTORY:   Past Medical History: Past Medical History:  Diagnosis Date   Cancer ([place])    CHF (congestive heart failure) (HCC)    Chronic pain    neck, back, knees   Class 1 obesity due to excess calories with body mass index (BMI) of 31.0 to 31.9 in adult  Claudication [facility])    Coronary artery disease    DDD (degenerative disc disease), cervical    Diabetes mellitus ([place])    Dyslipidemia    Hypertension    Port-A-Cath in place 07/20/2021   Tobacco dependence     Surgical History: Past Surgical History:  Procedure Laterality Date   BILIARY BRUSHING N/A 02/25/2021   Procedure: BILIARY BRUSHING;  Surgeon: Rogene Houston, MD;  Location: [facility];  Service: Gastroenterology;  Laterality: N/A;   BILIARY DILATION  10/23/2022   Procedure: BILIARY DILATION;  Surgeon: Rush Landmark Telford Nab., MD;  Location: [place];  Service: Gastroenterology;;   BILIARY STENT PLACEMENT N/A 02/25/2021   Procedure: BILIARY STENT PLACEMENT;  Surgeon: Rogene Houston, MD;  Location: [facility];  Service: Gastroenterology;  Laterality: N/A;   BILIARY STENT PLACEMENT  02/27/2021   Procedure: BILIARY STENT PLACEMENT (10FR x 9cm) IN THE RIGHT SYSTEM;  Surgeon: Rogene Houston, MD;   Location: [facility];  Service: Gastroenterology;;   BILIARY STENT PLACEMENT  10/23/2022   Procedure: BILIARY STENT PLACEMENT;  Surgeon: Irving Copas., MD;  Location: [place];  Service: Gastroenterology;;   BREAST SURGERY Left    benign lump- in his 37s.   CARDIAC CATHETERIZATION     ERCP N/A 02/25/2021   Procedure: ENDOSCOPIC RETROGRADE CHOLANGIOPANCREATOGRAPHY (ERCP);  Surgeon: Rogene Houston, MD;  Location: [facility];  Service: Gastroenterology;  Laterality: N/A;   ERCP N/A 02/27/2021   Procedure: ENDOSCOPIC RETROGRADE CHOLANGIOPANCREATOGRAPHY (ERCP);  Surgeon: Rogene Houston, MD;  Location: [facility];  Service: Gastroenterology;  Laterality: N/A;   ERCP N/A 10/23/2022   Procedure: ENDOSCOPIC RETROGRADE CHOLANGIOPANCREATOGRAPHY (ERCP);  Surgeon: Irving Copas., MD;  Location: [place];  Service: Gastroenterology;  Laterality: N/A;   IR IMAGING GUIDED PORT INSERTION  07/21/2021   IR REMOVAL TUN ACCESS W/ PORT W/O FL MOD SED  09/30/2022   KNEE SURGERY Left    x2   LEFT HEART CATH AND CORONARY ANGIOGRAPHY N/A 09/08/2021   Procedure: LEFT HEART CATH AND CORONARY ANGIOGRAPHY;  Surgeon: Early Osmond, MD;  Location: [place] [facility];  Service: Cardiovascular;  Laterality: N/A;   REMOVAL OF STONES  10/23/2022   Procedure: REMOVAL OF STONES;  Surgeon: Rush Landmark Telford Nab., MD;  Location: [place];  Service: Gastroenterology;;   SPHINCTEROTOMY N/A 02/25/2021   Procedure: Joan Mayans;  Surgeon: Rogene Houston, MD;  [facility];  Service: Gastroenterology;  Laterality: N/A;   STENT REMOVAL  02/27/2021   Procedure: STENT REMOVAL (8.5Fr x 9cm);  Surgeon: Rogene Houston, MD;  Location[facility];  Service: Gastroenterology;;    Social History: Social History   Socioeconomic History   Marital status: Married    Spouse name: Not on file   Number of children: Not on file   Years of education: Not on file   Highest education level: Not on file  Occupational  History   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  Tobacco Use   Smoking status: Every Day    Packs/day: 0.50    Years: 56.00    Additional pack years: 0.00    Total pack years: 28.00    Types: Cigarettes   Smokeless tobacco: Never  Vaping Use   Vaping Use: Never used  Substance and Sexual Activity   Alcohol use: Not Currently    Comment: stopped 2.5 years ago 03/11/21   Drug use: Never   Sexual activity: Not on file  Other Topics Concern   Not on file  Social History Narrative  Not on file   Social Determinants of Health   Financial Resource Strain: Medium Risk (03/07/2021)   Overall Financial Resource Strain (CARDIA)    Difficulty of Paying Living Expenses: Somewhat hard  Food Insecurity: No Food Insecurity (10/22/2022)   Hunger Vital Sign    Worried About Running Out of Food in the Last Year: Never true    Ran Out of Food in the Last Year: Never true  Transportation Needs: No Transportation Needs (10/22/2022)   PRAPARE - Hydrologist (Medical): No    Lack of Transportation (Non-Medical): No  Physical Activity: Insufficiently Active (03/07/2021)   Exercise Vital Sign    Days of Exercise per Week: 2 days    Minutes of Exercise per Session: 20 min  Stress: No Stress Concern Present (03/07/2021)   [place]    Feeling of Stress : Only a little  Social Connections: Moderately Integrated (03/07/2021)   Social Connection and Isolation Panel [NHANES]    Frequency of Communication with Friends and Family: More than three times a week    Frequency of Social Gatherings with Friends and Family: More than three times a week    Attends Religious Services: More than 4 times per year    Active Member of [employer] or Organizations: No    Attends Archivist Meetings: Never    Marital Status: Married  Human resources officer Violence: Not At Risk (10/22/2022)   Humiliation, Afraid, Rape, and Kick  questionnaire    Fear of Current or Ex-Partner: No    Emotionally Abused: No    Physically Abused: No    Sexually Abused: No    Family History: Family History  Problem Relation Age of Onset   CAD Mother    Cancer Neg Hx     Current Medications:  Current Outpatient Medications:    acetaminophen (TYLENOL) 500 MG tablet, Take 500 mg by mouth every 6 (six) hours as needed for moderate pain or headache., Disp: , Rfl:    albuterol (VENTOLIN HFA) 108 (90 Base) MCG/ACT inhaler, Inhale 2 puffs into the lungs every 6 (six) hours as needed for wheezing or shortness of breath., Disp: 6.7 g, Rfl: 1   alfuzosin (UROXATRAL) 10 MG 24 hr tablet, Take 1 tablet (10 mg total) by mouth at bedtime., Disp: 30 tablet, Rfl: 11   atorvastatin (LIPITOR) 40 MG tablet, Take 1 tablet (40 mg total) by mouth daily at 6 PM., Disp: 90 tablet, Rfl: 3   carvedilol (COREG) 3.125 MG tablet, Take 1 tablet (3.125 mg total) by mouth 2 (two) times daily with a meal., Disp: 60 tablet, Rfl: 0   CISPLATIN IV, Inject into the vein once a week. Days 1, 8 every 21 days, Disp: , Rfl:    Durvalumab (IMFINZI IV), Inject into the vein every 21 ( twenty-one) days., Disp: , Rfl:    famotidine (PEPCID) 20 MG tablet, Take 20 mg by mouth 2 (two) times daily., Disp: , Rfl:    ferrous sulfate (FERROUSUL) 325 (65 FE) MG tablet, Take 1 tablet (325 mg total) by mouth daily with breakfast., Disp: , Rfl:    furosemide (LASIX) 20 MG tablet, Take 20 mg by mouth as needed for fluid or edema. If he gains weight five pounds when taking chemo, take one tab a day till he is back to normal weight or within 2 pounds for fluid check before surgery., Disp: , Rfl:    Gemcitabine HCl (GEMZAR IV),  Inject into the vein once a week. Days 1, 8 every 21 days, Disp: , Rfl:    Ginseng 100 MG CAPS, Take 1 capsule by mouth daily., Disp: , Rfl:    lisinopril (ZESTRIL) 2.5 MG tablet, Take 1 tablet (2.5 mg total) by mouth daily., Disp: 90 tablet, Rfl: 3   metFORMIN  (GLUCOPHAGE) 500 MG tablet, Take 500 mg by mouth 2 (two) times daily with a meal., Disp: , Rfl:    Misc. Devices KIT, 1 Box by Does not apply route every 7 (seven) days., Disp: 1 kit, Rfl: 0   pantoprazole (PROTONIX) 40 MG tablet, Take 1 tablet (40 mg total) by mouth daily., Disp: 90 tablet, Rfl: 3   prochlorperazine (COMPAZINE) 10 MG tablet, TAKE (1) TABLET BY MOUTH EVERY (6) HOURS AS NEEDED. (Patient taking differently: Take 10 mg by mouth every 6 (six) hours as needed for nausea or vomiting. TAKE (1) TABLET BY MOUTH EVERY (6) HOURS AS NEEDED.), Disp: 90 tablet, Rfl: 0   sodium chloride flush (NS) 0.9 % SOLN, 10 mLs by Intracatheter route every 7 (seven) days. (Patient taking differently: 10 mLs by Intracatheter route daily.), Disp: 200 mL, Rfl: 3   tamsulosin (FLOMAX) 0.4 MG CAPS capsule, TAKE (2) CAPSULES BY MOUTH AT BEDTIME. (Patient taking differently: Take 0.8 mg by mouth at bedtime. TAKE (2) CAPSULES BY MOUTH AT BEDTIME.), Disp: 60 capsule, Rfl: 6   Vitamin D, Ergocalciferol, (DRISDOL) 1.25 MG (50000 UNIT) CAPS capsule, Take 1 capsule by mouth once a week., Disp: , Rfl:    Wound Dressings (INTRASITE GEL APPLIPAK) GEL, Apply 1 Units topically daily., Disp: 30 g, Rfl: 0   metFORMIN (GLUCOPHAGE) 500 MG tablet, Take 1 tablet (500 mg total) by mouth 2 (two) times daily with a meal., Disp: 60 tablet, Rfl: 0   nitroGLYCERIN (NITROSTAT) 0.4 MG SL tablet, Place 1 tablet (0.4 mg total) under the tongue every 5 (five) minutes as needed for chest pain., Disp: 25 tablet, Rfl: 3   Allergies: No Known Allergies  REVIEW OF SYSTEMS:   Review of Systems  Constitutional:  Positive for appetite change. Negative for chills, fatigue and fever.  HENT:   Negative for lump/mass, mouth sores, nosebleeds, sore throat and trouble swallowing.   Eyes:  Negative for eye problems.  Respiratory:  Positive for cough. Negative for shortness of breath.   Cardiovascular:  Negative for chest pain, leg swelling and  palpitations.  Gastrointestinal:  Positive for abdominal distention, nausea and vomiting. Negative for abdominal pain, constipation and diarrhea.  Genitourinary:  Positive for difficulty urinating. Negative for bladder incontinence, dysuria, frequency, hematuria and nocturia.   Musculoskeletal:  Negative for arthralgias, back pain, flank pain, myalgias and neck pain.  Skin:  Negative for itching and rash.  Neurological:  Negative for dizziness, headaches and numbness.  Hematological:  Does not bruise/bleed easily.  Psychiatric/Behavioral:  Positive for sleep disturbance. Negative for depression and suicidal ideas. The patient is not nervous/anxious.   All other systems reviewed and are negative.    VITALS:   There were no vitals taken for this visit.  Wt Readings from Last 3 Encounters:  11/02/22 163 lb 9.6 oz (74.2 kg)  10/25/22 165 lb 9.1 oz (75.1 kg)  10/19/22 161 lb 4.8 oz (73.2 kg)    There is no height or weight on file to calculate BMI.  Performance status (ECOG): 1 - Symptomatic but completely ambulatory  PHYSICAL EXAM:   Physical Exam Vitals and nursing note reviewed. Exam conducted with a chaperone present.  Constitutional:      Appearance: Normal appearance.  Cardiovascular:     Rate and Rhythm: Normal rate and regular rhythm.     Pulses: Normal pulses.     Heart sounds: Normal heart sounds.  Pulmonary:     Effort: Pulmonary effort is normal.     Breath sounds: Normal breath sounds.  Abdominal:     General: There is distension.     Palpations: Abdomen is soft. There is no hepatomegaly, splenomegaly or mass.     Tenderness: There is no abdominal tenderness.  Musculoskeletal:     Right lower leg: No edema.     Left lower leg: No edema.  Lymphadenopathy:     Cervical: No cervical adenopathy.     Right cervical: No superficial, deep or posterior cervical adenopathy.    Left cervical: No superficial, deep or posterior cervical adenopathy.     Upper Body:      Right upper body: No supraclavicular or axillary adenopathy.     Left upper body: No supraclavicular or axillary adenopathy.  Neurological:     General: No focal deficit present.     Mental Status: He is alert and oriented to person, place, and time.  Psychiatric:        Mood and Affect: Mood normal.        Behavior: Behavior normal.     LABS:      Latest Ref Rng & Units 11/02/2022    7:48 AM 10/25/2022    3:46 AM 10/24/2022    4:30 AM  CBC  WBC 4.0 - 10.5 K/uL 7.4  4.4  4.9   Hemoglobin 13.0 - 17.0 g/dL 9.6  8.5  8.7   Hematocrit 39.0 - 52.0 % 28.0  25.6  25.2   Platelets 150 - 400 K/uL 122  111  115       Latest Ref Rng & Units 11/02/2022    7:48 AM 10/25/2022    3:46 AM 10/24/2022    4:30 AM  CMP  Glucose 70 - 99 mg/dL 153  104  98   BUN 8 - 23 mg/dL 22  18  23    Creatinine 0.61 - 1.24 mg/dL 1.50  1.49  1.60   Sodium 135 - 145 mmol/L 129  134  133   Potassium 3.5 - 5.1 mmol/L 4.3  4.2  4.2   Chloride 98 - 111 mmol/L 98  107  105   CO2 22 - 32 mmol/L 21  22  22    Calcium 8.9 - 10.3 mg/dL 8.0  8.2  8.0   Total Protein 6.5 - 8.1 g/dL 5.7  5.1  5.0   Total Bilirubin 0.3 - 1.2 mg/dL 2.9  2.7  2.8   Alkaline Phos 38 - 126 U/L 353  212  221   AST 15 - 41 U/L 136  80  89   ALT 0 - 44 U/L 82  70  69    Bilirubin     Component Value Date/Time   BILITOT 2.9 (H) 11/02/2022 0748   BILITOT 1.7 (H) 06/16/2021 0925   BILIDIR 1.6 (H) 11/02/2022 0905   BILIDIR 2.29 (H) 06/04/2021 1000   IBILI 0.6 11/26/2021 2132    Lab Results  Component Value Date   CEA1 3.9 07/17/2021   /  CEA  Date Value Ref Range Status  07/17/2021 3.9 0.0 - 4.7 ng/mL Final    Comment:    (NOTE)  Nonsmokers          <3.9                             Smokers             <5.6 Roche [employer] Electrochemiluminescence Immunoassay (ECLIA) Values obtained with different assay methods or kits cannot be used interchangeably.  Results cannot be interpreted as absolute evidence of  the presence or absence of malignant disease. Performed At: [facility] [place], [place] [id number] Rush Farmer MD [id number]    No results found for: "PSA1" Lab Results  Component Value Date   [phone] (H) 10/21/2022   No results found for: "CAN125"  No results found for: "TOTALPROTELP", "ALBUMINELP", "A1GS", "A2GS", "BETS", "BETA2SER", "GAMS", "MSPIKE", "SPEI" Lab Results  Component Value Date   TIBC 139 (L) 09/12/2021   TIBC 154 (L) 03/13/2021   FERRITIN 1,132 (H) 09/12/2021   FERRITIN 708 (H) 03/13/2021   IRONPCTSAT 16 (L) 09/12/2021   IRONPCTSAT 17 (L) 03/13/2021   No results found for: "LDH"  ERCP 10/23/22:  - Extreme left lateral positioning was required to                            intubate the duodenum.                           - Prior biliary sphincterotomy appeared open.                           - Presence of UCSEMS within the biliary tree.                           - A single severe biliary stricture was found in                            the left main hepatic duct. The stricture was                            malignant appearing.                           -It is difficult to discern as a result of the                            coils, but I do not see a great right hepatic                            system. I suspect the patient really has just the                            left hepatic system currently because of the                            cholangiocarcinoma. A single severe biliary  stricture was noted in the left hepatic duct area                            and bifurcation. This led to notation of what was                            felt to be left intrahepatic branches that were                            mildly dilated.                           - The stricture was successfully dilated.                           - The biliary tree was swept and sludge was found.                           -  One plastic biliary stent was placed through the                            UCSEMS into the left hepatic duct to traverse the                            stricture.  STUDIES:   US Paracentesis  Result Date: 11/02/2022 INDICATION: Malignant ascites. EXAM: ULTRASOUND GUIDED  PARACENTESIS MEDICATIONS: None. COMPLICATIONS: None immediate. PROCEDURE: Informed written consent was obtained from the patient after a discussion of the risks, benefits and alternatives to treatment. A timeout was performed prior to the initiation of the procedure. Initial ultrasound scanning demonstrates a large amount of ascites within the right lower abdominal quadrant. The right lower abdomen was prepped and draped in the usual sterile fashion. 1% lidocaine was used for local anesthesia. Following this, a 8 Fr Safe-T-Centesis catheter was introduced. An ultrasound image was saved for documentation purposes. The paracentesis was performed. The catheter was removed and a dressing was applied. The patient tolerated the procedure well without immediate post procedural complication. FINDINGS: A total of approximately 3.3 L of yellowish fluid was removed. IMPRESSION: Successful ultrasound-guided paracentesis yielding 3.3 liters of peritoneal fluid. Electronically Signed   By: Franki Cabot M.D.   On: 11/02/2022 12:22   DG ERCP  Result Date: 10/23/2022 CLINICAL DATA:  [id number] Surgery, elective [id number] EXAM: ERCP COMPARISON:  MRCP, 10/19/2022.  CT [facility], 08/28/2022. FLUOROSCOPY: Exposure Index (as provided by the fluoroscopic device): 88.4 mGy Kerma FINDINGS: Multiple, limited oblique planar images of the RIGHT upper quadrant obtained C-arm. Multiple embolization coils within liver and pre-existing metallic biliary stent are present. Images demonstrating flexible endoscopy, biliary duct cannulation, retrograde cholangiogram and balloon dilatation of biliary ductal stricture and plastic biliary stent placement. A short segment stricture at the  level of the hepatic hilum and proximal common bile duct is present. No overt intrahepatic biliary ductal dilation. IMPRESSION: Fluoroscopic imaging for ERCP. Balloon angioplasty of biliary stricture and plastic biliary stent placement. For complete description of intra procedural findings, please see performing service dictation. Electronically Signed   By: Michaelle Birks M.D.   On: 10/23/2022 11:55   DG C-Arm 1-60 Min-No Report  Result Date: 10/23/2022 Fluoroscopy was utilized by the requesting physician.  No radiographic interpretation.   MR ABDOMEN MRCP W WO CONTAST  Result Date: 10/20/2022 CLINICAL DATA:  Intrahepatic cholangiocarcinoma, elevated bilirubin EXAM: MRI ABDOMEN WITHOUT AND WITH CONTRAST (INCLUDING MRCP) TECHNIQUE: Multiplanar multisequence MR imaging of the abdomen was performed both before and after the administration of intravenous contrast. Heavily T2-weighted images of the biliary and pancreatic ducts were obtained, and three-dimensional MRCP images were rendered by post processing. CONTRAST:  36mL GADAVIST GADOBUTROL 1 MMOL/ML IV SOLN COMPARISON:  CT chest abdomen pelvis, 08/28/2022 MR abdomen, 02/25/2021 FINDINGS: Lower chest: No acute abnormality. Hepatobiliary: No directly visualized mass or suspicious contrast enhancement in the liver. Susceptibility artifact related to coiling in the right lobe of the liver (series 23, image 37, 45). Single large gallstone contracted in the central gallbladder near the gallbladder neck (series 4, image 21). Moderate intrahepatic biliary ductal dilatation, similar in appearance to prior examination. No directly visualized mass. Common bile duct stent, although poorly visualized by MRI is an unchanged positioned within the central common bile duct. Pancreas: Unremarkable. No pancreatic ductal dilatation or surrounding inflammatory changes. Spleen: Normal in size without significant abnormality. Adrenals/Urinary Tract: Adrenal glands are unremarkable.  Kidneys are normal, without renal calculi, solid lesion, or hydronephrosis. Stomach/Bowel: Stomach is within normal limits. No evidence of bowel wall thickening, distention, or inflammatory changes. Vascular/Lymphatic: Severe aortic atherosclerosis. No enlarged abdominal lymph nodes. Other: No abdominal wall hernia or abnormality. Small volume ascites throughout the upper abdomen, similar in volume to prior examination. Musculoskeletal: No acute or significant osseous findings. IMPRESSION: 1. Moderate intrahepatic biliary ductal dilatation, similar in appearance to prior examination. No directly visualized mass. Common bile duct stent, although poorly visualized by MRI is in unchanged positioned within the central common bile duct. 2. Small volume ascites similar to prior examination. 3. Single large gallstone contracted in the central gallbladder near the gallbladder neck. 4. Severe aortic atherosclerosis. Aortic Atherosclerosis (ICD10-I70.0). Electronically Signed   By: Delanna Ahmadi M.D.   On: 10/20/2022 09:30   MR 3D Recon At Scanner  Result Date: 10/20/2022 CLINICAL DATA:  Intrahepatic cholangiocarcinoma, elevated bilirubin EXAM: MRI ABDOMEN WITHOUT AND WITH CONTRAST (INCLUDING MRCP) TECHNIQUE: Multiplanar multisequence MR imaging of the abdomen was performed both before and after the administration of intravenous contrast. Heavily T2-weighted images of the biliary and pancreatic ducts were obtained, and three-dimensional MRCP images were rendered by post processing. CONTRAST:  68mL GADAVIST GADOBUTROL 1 MMOL/ML IV SOLN COMPARISON:  CT chest abdomen pelvis, 08/28/2022 MR abdomen, 02/25/2021 FINDINGS: Lower chest: No acute abnormality. Hepatobiliary: No directly visualized mass or suspicious contrast enhancement in the liver. Susceptibility artifact related to coiling in the right lobe of the liver (series 23, image 37, 45). Single large gallstone contracted in the central gallbladder near the gallbladder  neck (series 4, image 21). Moderate intrahepatic biliary ductal dilatation, similar in appearance to prior examination. No directly visualized mass. Common bile duct stent, although poorly visualized by MRI is an unchanged positioned within the central common bile duct. Pancreas: Unremarkable. No pancreatic ductal dilatation or surrounding inflammatory changes. Spleen: Normal in size without significant abnormality. Adrenals/Urinary Tract: Adrenal glands are unremarkable. Kidneys are normal, without renal calculi, solid lesion, or hydronephrosis. Stomach/Bowel: Stomach is within normal limits. No evidence of bowel wall thickening, distention, or inflammatory changes. Vascular/Lymphatic: Severe aortic atherosclerosis. No enlarged abdominal lymph nodes. Other: No abdominal wall hernia or abnormality. Small volume ascites throughout the upper abdomen, similar in volume to prior examination. Musculoskeletal: No  acute or significant osseous findings. IMPRESSION: 1. Moderate intrahepatic biliary ductal dilatation, similar in appearance to prior examination. No directly visualized mass. Common bile duct stent, although poorly visualized by MRI is in unchanged positioned within the central common bile duct. 2. Small volume ascites similar to prior examination. 3. Single large gallstone contracted in the central gallbladder near the gallbladder neck. 4. Severe aortic atherosclerosis. Aortic Atherosclerosis (ICD10-I70.0). Electronically Signed   By: Delanna Ahmadi M.D.   On: 10/20/2022 09:30   US Paracentesis  Result Date: 10/19/2022 INDICATION: Malignant ascites, cholangiocarcinoma EXAM: ULTRASOUND GUIDED THERAPEUTIC PARACENTESIS MEDICATIONS: None. COMPLICATIONS: None immediate. PROCEDURE: Informed written consent was obtained from the patient after a discussion of the risks, benefits and alternatives to treatment. A timeout was performed prior to the initiation of the procedure. Initial ultrasound scanning demonstrates a  large amount of ascites within the right lower abdominal quadrant. The right lower abdomen was prepped and draped in the usual sterile fashion. 1% lidocaine was used for local anesthesia. Following this, a 19 gauge, 7-cm, Yueh catheter was introduced. An ultrasound image was saved for documentation purposes. The paracentesis was performed. The catheter was removed and a dressing was applied. The patient tolerated the procedure well without immediate post procedural complication. Patient received post-procedure intravenous albumin; see nursing notes for details. FINDINGS: A total of approximately 3.5 L of yellow ascitic fluid was removed. IMPRESSION: Successful ultrasound-guided paracentesis yielding 3.5 liters of peritoneal fluid. Electronically Signed   By: Lavonia Dana M.D.   On: 10/19/2022 11:42

## 2022-11-02 NOTE — Progress Notes (Signed)
We will hold treatment today due to elevated bilirubin. We will try to restart treatment next week per Dr.K.

## 2022-11-02 NOTE — Progress Notes (Signed)
Paracentesis complete no signs of distress. 3233ml ascites removed.

## 2022-11-05 ENCOUNTER — Inpatient Hospital Stay: Payer: Medicare Other

## 2022-11-07 ENCOUNTER — Other Ambulatory Visit: Payer: Self-pay

## 2022-11-07 ENCOUNTER — Emergency Department (HOSPITAL_COMMUNITY): Payer: Medicare Other

## 2022-11-07 ENCOUNTER — Inpatient Hospital Stay (HOSPITAL_COMMUNITY)
Admission: EM | Admit: 2022-11-07 | Discharge: 2022-11-16 | DRG: 871 | Disposition: A | Discharge status: Home Health | Payer: Medicare Other | Attending: Internal Medicine | Admitting: Internal Medicine

## 2022-11-07 ENCOUNTER — Encounter (HOSPITAL_COMMUNITY): Payer: Self-pay | Admitting: *Deleted

## 2022-11-07 DIAGNOSIS — D631 Anemia in chronic kidney disease: Secondary | ICD-10-CM | POA: Diagnosis present

## 2022-11-07 DIAGNOSIS — I700 Atherosclerosis of aorta: Secondary | ICD-10-CM | POA: Diagnosis present

## 2022-11-07 DIAGNOSIS — R6520 Severe sepsis without septic shock: Secondary | ICD-10-CM | POA: Diagnosis present

## 2022-11-07 DIAGNOSIS — R911 Solitary pulmonary nodule: Secondary | ICD-10-CM | POA: Diagnosis present

## 2022-11-07 DIAGNOSIS — C221 Intrahepatic bile duct carcinoma: Secondary | ICD-10-CM | POA: Diagnosis not present

## 2022-11-07 DIAGNOSIS — R6884 Jaw pain: Secondary | ICD-10-CM | POA: Diagnosis present

## 2022-11-07 DIAGNOSIS — M549 Dorsalgia, unspecified: Secondary | ICD-10-CM | POA: Diagnosis present

## 2022-11-07 DIAGNOSIS — R7401 Elevation of levels of liver transaminase levels: Secondary | ICD-10-CM | POA: Diagnosis present

## 2022-11-07 DIAGNOSIS — K8021 Calculus of gallbladder without cholecystitis with obstruction: Secondary | ICD-10-CM | POA: Diagnosis present

## 2022-11-07 DIAGNOSIS — R112 Nausea with vomiting, unspecified: Secondary | ICD-10-CM | POA: Diagnosis not present

## 2022-11-07 DIAGNOSIS — N210 Calculus in bladder: Secondary | ICD-10-CM | POA: Diagnosis present

## 2022-11-07 DIAGNOSIS — I2510 Atherosclerotic heart disease of native coronary artery without angina pectoris: Secondary | ICD-10-CM | POA: Diagnosis present

## 2022-11-07 DIAGNOSIS — Z8249 Family history of ischemic heart disease and other diseases of the circulatory system: Secondary | ICD-10-CM

## 2022-11-07 DIAGNOSIS — E782 Mixed hyperlipidemia: Secondary | ICD-10-CM | POA: Diagnosis not present

## 2022-11-07 DIAGNOSIS — R748 Abnormal levels of other serum enzymes: Secondary | ICD-10-CM | POA: Diagnosis present

## 2022-11-07 DIAGNOSIS — K219 Gastro-esophageal reflux disease without esophagitis: Secondary | ICD-10-CM | POA: Diagnosis not present

## 2022-11-07 DIAGNOSIS — R749 Abnormal serum enzyme level, unspecified: Secondary | ICD-10-CM | POA: Diagnosis not present

## 2022-11-07 DIAGNOSIS — E861 Hypovolemia: Secondary | ICD-10-CM | POA: Diagnosis present

## 2022-11-07 DIAGNOSIS — Z6823 Body mass index (BMI) 23.0-23.9, adult: Secondary | ICD-10-CM

## 2022-11-07 DIAGNOSIS — N189 Chronic kidney disease, unspecified: Secondary | ICD-10-CM | POA: Diagnosis not present

## 2022-11-07 DIAGNOSIS — R6521 Severe sepsis with septic shock: Secondary | ICD-10-CM | POA: Diagnosis present

## 2022-11-07 DIAGNOSIS — D696 Thrombocytopenia, unspecified: Secondary | ICD-10-CM | POA: Diagnosis present

## 2022-11-07 DIAGNOSIS — R7881 Bacteremia: Secondary | ICD-10-CM | POA: Diagnosis not present

## 2022-11-07 DIAGNOSIS — F17210 Nicotine dependence, cigarettes, uncomplicated: Secondary | ICD-10-CM | POA: Diagnosis present

## 2022-11-07 DIAGNOSIS — D630 Anemia in neoplastic disease: Secondary | ICD-10-CM | POA: Diagnosis present

## 2022-11-07 DIAGNOSIS — Z1152 Encounter for screening for COVID-19: Secondary | ICD-10-CM | POA: Diagnosis not present

## 2022-11-07 DIAGNOSIS — C799 Secondary malignant neoplasm of unspecified site: Secondary | ICD-10-CM | POA: Diagnosis present

## 2022-11-07 DIAGNOSIS — A4181 Sepsis due to Enterococcus: Secondary | ICD-10-CM | POA: Diagnosis not present

## 2022-11-07 DIAGNOSIS — I130 Hypertensive heart and chronic kidney disease with heart failure and stage 1 through stage 4 chronic kidney disease, or unspecified chronic kidney disease: Secondary | ICD-10-CM | POA: Diagnosis present

## 2022-11-07 DIAGNOSIS — E871 Hypo-osmolality and hyponatremia: Secondary | ICD-10-CM | POA: Diagnosis present

## 2022-11-07 DIAGNOSIS — E1122 Type 2 diabetes mellitus with diabetic chronic kidney disease: Secondary | ICD-10-CM | POA: Diagnosis present

## 2022-11-07 DIAGNOSIS — E46 Unspecified protein-calorie malnutrition: Secondary | ICD-10-CM | POA: Diagnosis not present

## 2022-11-07 DIAGNOSIS — N179 Acute kidney failure, unspecified: Secondary | ICD-10-CM | POA: Diagnosis present

## 2022-11-07 DIAGNOSIS — C7889 Secondary malignant neoplasm of other digestive organs: Secondary | ICD-10-CM

## 2022-11-07 DIAGNOSIS — K831 Obstruction of bile duct: Secondary | ICD-10-CM

## 2022-11-07 DIAGNOSIS — Z7984 Long term (current) use of oral hypoglycemic drugs: Secondary | ICD-10-CM

## 2022-11-07 DIAGNOSIS — E860 Dehydration: Secondary | ICD-10-CM | POA: Diagnosis not present

## 2022-11-07 DIAGNOSIS — A4150 Gram-negative sepsis, unspecified: Secondary | ICD-10-CM | POA: Diagnosis not present

## 2022-11-07 DIAGNOSIS — N184 Chronic kidney disease, stage 4 (severe): Secondary | ICD-10-CM | POA: Diagnosis present

## 2022-11-07 DIAGNOSIS — R7303 Prediabetes: Secondary | ICD-10-CM | POA: Diagnosis not present

## 2022-11-07 DIAGNOSIS — A419 Sepsis, unspecified organism: Secondary | ICD-10-CM | POA: Diagnosis not present

## 2022-11-07 DIAGNOSIS — D72829 Elevated white blood cell count, unspecified: Secondary | ICD-10-CM | POA: Diagnosis present

## 2022-11-07 DIAGNOSIS — E872 Acidosis, unspecified: Secondary | ICD-10-CM | POA: Insufficient documentation

## 2022-11-07 DIAGNOSIS — E1165 Type 2 diabetes mellitus with hyperglycemia: Secondary | ICD-10-CM | POA: Diagnosis present

## 2022-11-07 DIAGNOSIS — R180 Malignant ascites: Secondary | ICD-10-CM | POA: Diagnosis not present

## 2022-11-07 DIAGNOSIS — N170 Acute kidney failure with tubular necrosis: Secondary | ICD-10-CM | POA: Diagnosis present

## 2022-11-07 DIAGNOSIS — K8309 Other cholangitis: Secondary | ICD-10-CM

## 2022-11-07 DIAGNOSIS — R140 Abdominal distension (gaseous): Secondary | ICD-10-CM | POA: Diagnosis not present

## 2022-11-07 DIAGNOSIS — I5022 Chronic systolic (congestive) heart failure: Secondary | ICD-10-CM | POA: Diagnosis present

## 2022-11-07 DIAGNOSIS — E8809 Other disorders of plasma-protein metabolism, not elsewhere classified: Secondary | ICD-10-CM | POA: Diagnosis not present

## 2022-11-07 DIAGNOSIS — Z4659 Encounter for fitting and adjustment of other gastrointestinal appliance and device: Secondary | ICD-10-CM | POA: Diagnosis not present

## 2022-11-07 DIAGNOSIS — I255 Ischemic cardiomyopathy: Secondary | ICD-10-CM | POA: Diagnosis present

## 2022-11-07 DIAGNOSIS — Z79899 Other long term (current) drug therapy: Secondary | ICD-10-CM

## 2022-11-07 DIAGNOSIS — R188 Other ascites: Secondary | ICD-10-CM | POA: Diagnosis not present

## 2022-11-07 DIAGNOSIS — G8929 Other chronic pain: Secondary | ICD-10-CM | POA: Diagnosis present

## 2022-11-07 DIAGNOSIS — E1151 Type 2 diabetes mellitus with diabetic peripheral angiopathy without gangrene: Secondary | ICD-10-CM | POA: Diagnosis present

## 2022-11-07 DIAGNOSIS — Z5986 Financial insecurity: Secondary | ICD-10-CM

## 2022-11-07 DIAGNOSIS — I10 Essential (primary) hypertension: Secondary | ICD-10-CM | POA: Diagnosis not present

## 2022-11-07 DIAGNOSIS — I252 Old myocardial infarction: Secondary | ICD-10-CM | POA: Diagnosis not present

## 2022-11-07 LAB — CBC WITH DIFFERENTIAL/PLATELET
Abs Immature Granulocytes: 0.27 10*3/uL — ABNORMAL HIGH (ref 0.00–0.07)
Basophils Absolute: 0 10*3/uL (ref 0.0–0.1)
Basophils Relative: 0 %
Eosinophils Absolute: 0 10*3/uL (ref 0.0–0.5)
Eosinophils Relative: 0 %
HCT: 27.2 % — ABNORMAL LOW (ref 39.0–52.0)
Hemoglobin: 9.3 g/dL — ABNORMAL LOW (ref 13.0–17.0)
Immature Granulocytes: 1 %
Lymphocytes Relative: 2 %
Lymphs Abs: 0.6 10*3/uL — ABNORMAL LOW (ref 0.7–4.0)
MCH: 33.6 pg (ref 26.0–34.0)
MCHC: 34.2 g/dL (ref 30.0–36.0)
MCV: 98.2 fL (ref 80.0–100.0)
Monocytes Absolute: 1.9 10*3/uL — ABNORMAL HIGH (ref 0.1–1.0)
Monocytes Relative: 8 %
Neutro Abs: 21.6 10*3/uL — ABNORMAL HIGH (ref 1.7–7.7)
Neutrophils Relative %: 89 %
Platelets: 92 10*3/uL — ABNORMAL LOW (ref 150–400)
RBC: 2.77 MIL/uL — ABNORMAL LOW (ref 4.22–5.81)
RDW: 15.9 % — ABNORMAL HIGH (ref 11.5–15.5)
WBC: 24.3 10*3/uL — ABNORMAL HIGH (ref 4.0–10.5)
nRBC: 0 % (ref 0.0–0.2)

## 2022-11-07 LAB — COMPREHENSIVE METABOLIC PANEL
ALT: 59 U/L — ABNORMAL HIGH (ref 0–44)
AST: 92 U/L — ABNORMAL HIGH (ref 15–41)
Albumin: 1.9 g/dL — ABNORMAL LOW (ref 3.5–5.0)
Alkaline Phosphatase: 365 U/L — ABNORMAL HIGH (ref 38–126)
Anion gap: 8 (ref 5–15)
BUN: 37 mg/dL — ABNORMAL HIGH (ref 8–23)
CO2: 20 mmol/L — ABNORMAL LOW (ref 22–32)
Calcium: 7.9 mg/dL — ABNORMAL LOW (ref 8.9–10.3)
Chloride: 96 mmol/L — ABNORMAL LOW (ref 98–111)
Creatinine, Ser: 2.51 mg/dL — ABNORMAL HIGH (ref 0.61–1.24)
GFR, Estimated: 28 mL/min — ABNORMAL LOW (ref >60)
Glucose, Bld: 161 mg/dL — ABNORMAL HIGH (ref 70–99)
Potassium: 4.5 mmol/L (ref 3.5–5.1)
Sodium: 124 mmol/L — ABNORMAL LOW (ref 135–145)
Total Bilirubin: 4 mg/dL — ABNORMAL HIGH (ref 0.3–1.2)
Total Protein: 5.6 g/dL — ABNORMAL LOW (ref 6.5–8.1)

## 2022-11-07 LAB — URINALYSIS, ROUTINE W REFLEX MICROSCOPIC
Bilirubin Urine: NEGATIVE
Glucose, UA: NEGATIVE mg/dL
Ketones, ur: NEGATIVE mg/dL
Leukocytes,Ua: NEGATIVE
Nitrite: NEGATIVE
Protein, ur: 30 mg/dL — AB
Specific Gravity, Urine: 1.017 (ref 1.005–1.030)
pH: 5 (ref 5.0–8.0)

## 2022-11-07 LAB — I-STAT CHEM 8, ED
BUN: 32 mg/dL — ABNORMAL HIGH (ref 8–23)
Calcium, Ion: 1.13 mmol/L — ABNORMAL LOW (ref 1.15–1.40)
Chloride: 96 mmol/L — ABNORMAL LOW (ref 98–111)
Creatinine, Ser: 2.9 mg/dL — ABNORMAL HIGH (ref 0.61–1.24)
Glucose, Bld: 155 mg/dL — ABNORMAL HIGH (ref 70–99)
HCT: 27 % — ABNORMAL LOW (ref 39.0–52.0)
Hemoglobin: 9.2 g/dL — ABNORMAL LOW (ref 13.0–17.0)
Potassium: 4.6 mmol/L (ref 3.5–5.1)
Sodium: 128 mmol/L — ABNORMAL LOW (ref 135–145)
TCO2: 21 mmol/L — ABNORMAL LOW (ref 22–32)

## 2022-11-07 LAB — PROTIME-INR
INR: 1.3 — ABNORMAL HIGH (ref 0.8–1.2)
Prothrombin Time: 16.2 seconds — ABNORMAL HIGH (ref 11.4–15.2)

## 2022-11-07 LAB — LACTIC ACID, PLASMA
Lactic Acid, Venous: 3.3 mmol/L (ref 0.5–1.9)
Lactic Acid, Venous: 3.8 mmol/L (ref 0.5–1.9)
Lactic Acid, Venous: 1.8 mmol/L (ref 0.5–1.9)

## 2022-11-07 LAB — APTT: aPTT: 39 seconds — ABNORMAL HIGH (ref 24–36)

## 2022-11-07 LAB — RESP PANEL BY RT-PCR (RSV, FLU A&B, COVID)  RVPGX2
Influenza A by PCR: NEGATIVE
Influenza B by PCR: NEGATIVE
Resp Syncytial Virus by PCR: NEGATIVE
SARS Coronavirus 2 by RT PCR: NEGATIVE

## 2022-11-07 MED ORDER — LACTATED RINGERS IV SOLN: INTRAVENOUS | Status: DC

## 2022-11-07 MED ORDER — ALBUMIN HUMAN 25 % IV SOLN
25.0000 g | Freq: Once | INTRAVENOUS | Status: DC
  Filled 2022-11-07: qty 100

## 2022-11-07 MED ORDER — ONDANSETRON HCL 4 MG PO TABS: 4.0000 mg | ORAL_TABLET | Freq: Four times a day (QID) | ORAL | Status: DC | PRN

## 2022-11-07 MED ORDER — SODIUM CHLORIDE 0.9 % IV SOLN: INTRAVENOUS | Status: AC

## 2022-11-07 MED ORDER — LACTATED RINGERS IV BOLUS: 1000.0000 mL | Freq: Once | INTRAVENOUS | Status: DC

## 2022-11-07 MED ORDER — GLUCERNA SHAKE PO LIQD
237.0000 mL | Freq: Three times a day (TID) | ORAL | Status: DC
  Administered 2022-11-07 – 2022-11-08 (×2): 237 mL via ORAL

## 2022-11-07 MED ORDER — SODIUM CHLORIDE 0.9 % IV SOLN
2.0000 g | INTRAVENOUS | Status: DC
  Administered 2022-11-08: 2 g via INTRAVENOUS
  Filled 2022-11-07: qty 20

## 2022-11-07 MED ORDER — ONDANSETRON HCL 4 MG/2ML IJ SOLN: 4.0000 mg | Freq: Four times a day (QID) | INTRAMUSCULAR | Status: DC | PRN

## 2022-11-07 MED ORDER — PANTOPRAZOLE SODIUM 40 MG IV SOLR
40.0000 mg | INTRAVENOUS | Status: DC
  Administered 2022-11-07 – 2022-11-11 (×5): 40 mg via INTRAVENOUS
  Filled 2022-11-07 (×5): qty 10

## 2022-11-07 MED ORDER — VANCOMYCIN HCL 1500 MG/300ML IV SOLN
1500.0000 mg | Freq: Once | INTRAVENOUS | Status: AC
  Administered 2022-11-07: 1500 mg via INTRAVENOUS
  Filled 2022-11-07: qty 300

## 2022-11-07 MED ORDER — METRONIDAZOLE 500 MG/100ML IV SOLN
500.0000 mg | Freq: Once | INTRAVENOUS | Status: AC
  Administered 2022-11-07: 500 mg via INTRAVENOUS
  Filled 2022-11-07: qty 100

## 2022-11-07 MED ORDER — VANCOMYCIN HCL 750 MG/150ML IV SOLN: 750.0000 mg | INTRAVENOUS | Status: DC

## 2022-11-07 MED ORDER — CHLORHEXIDINE GLUCONATE CLOTH 2 % EX PADS
6.0000 | MEDICATED_PAD | Freq: Every day | CUTANEOUS | Status: DC
  Administered 2022-11-07 – 2022-11-16 (×10): 6 via TOPICAL

## 2022-11-07 MED ORDER — SODIUM CHLORIDE 0.9% FLUSH
10.0000 mL | Freq: Two times a day (BID) | INTRAVENOUS | Status: DC
  Administered 2022-11-08 – 2022-11-15 (×15): 10 mL

## 2022-11-07 MED ORDER — SODIUM CHLORIDE 0.9 % IV SOLN
2.0000 g | Freq: Once | INTRAVENOUS | Status: AC
  Administered 2022-11-07: 2 g via INTRAVENOUS
  Filled 2022-11-07: qty 12.5

## 2022-11-07 MED ORDER — LACTATED RINGERS IV BOLUS: 30.0000 mL/kg | Freq: Once | INTRAVENOUS | Status: DC

## 2022-11-07 MED ORDER — LACTATED RINGERS IV BOLUS
1000.0000 mL | Freq: Once | INTRAVENOUS | Status: AC
  Administered 2022-11-07: 1000 mL via INTRAVENOUS

## 2022-11-07 MED ORDER — LIDOCAINE HCL URETHRAL/MUCOSAL 2 % EX GEL
1.0000 | Freq: Once | CUTANEOUS | Status: AC
  Administered 2022-11-07: 1 via URETHRAL
  Filled 2022-11-07: qty 10

## 2022-11-07 MED ORDER — VANCOMYCIN HCL IN DEXTROSE 1-5 GM/200ML-% IV SOLN: 1000.0000 mg | Freq: Once | INTRAVENOUS | Status: DC

## 2022-11-07 NOTE — Progress Notes (Signed)
[facility] following for sepsis protocol. 

## 2022-11-07 NOTE — ED Provider Notes (Signed)
  Provider Note MRN:  [id number]  Arrival date & time: 11/07/22    ED Course and Medical Decision Making  Assumed care from Dr Truett Mainland at shift change.  See note from prior team for complete details, in brief:  67 yo male Cholangiocarcinoma, carcinomatosis  Dr Rush [facility]mark gi ERCP 3/14 Fever/weakness last few days, worsened abd pain Wbc 24, Cr 2.5, LA 3.8 RUQ ttp +biliary dilation on CTAP concern for stent dysfunction Spoke w/ GI here Dr Jenetta Downer, recommend to [facility] vs [facility] ?ERCP rpt [facility] Pending [facility] consult  Vanc/cefep/flagyl in process, IVF in process  Plan per prior physician gi consult   Will give more LR given +LA, HR elev. MAP stable  D/w [facility] PA [place], recommends send to [facility], ERCP in AM  Admitted to [facility], plan send to [facility] for GI eval in AM and likely ERCP, NPO @ [facility].   Severe sepsis, ?cholangitis although stent in place but dysfunctional  .Critical Care  Performed by: Jeanell Sparrow, DO Authorized by: Jeanell Sparrow, DO   Critical care provider statement:    Critical care time (minutes):  30   Critical care time was exclusive of:  Separately billable procedures and treating other patients   Critical care was necessary to treat or prevent imminent or life-threatening deterioration of the following conditions:  Sepsis   Critical care was time spent personally by me on the following activities:  Development of treatment plan with patient or surrogate, discussions with consultants, evaluation of patient's response to treatment, examination of patient, ordering and review of laboratory studies, ordering and review of radiographic studies, ordering and performing treatments and interventions, pulse oximetry, re-evaluation of patient's condition, review of old charts and obtaining history from patient or surrogate   Care discussed with: admitting provider     Final Clinical Impressions(s) / ED Diagnoses     ICD-10-CM   1. Severe sepsis ([place])  A41.9    R65.20     2.  Cholangitis  K83.09       ED Discharge Orders     None       Discharge Instructions   None        Jeanell Sparrow, DO 11/07/22 1602

## 2022-11-07 NOTE — H&P (Addendum)
History and Physical    Patient: Jonathan Keith [id number] DOB: 1955-11-15 DOA: 11/07/2022 DOS: the patient was seen and examined on 11/07/2022 PCP: Redmond School, MD  Patient coming from: Home  Chief Complaint:  Chief Complaint  Patient presents with   Fever   HPI: Jonathan Keith is a 67 y.o. male with medical history significant of metastatic cholangiocarcinoma with biliary obstruction and recent stent placement, hyperlipidemia, CAD with ischemic cardiomyopathy (LVEF 45 to 50%;  +RWMA - 09/14/2022), AKI on CKD 4, recent LV thrombosis s/p treatment with Coumadin, AOCD who presents to the emergency department due to 3-day onset of fever and generalized weakness.  Patient complained of generalized abdominal pain which was worse in the right upper quadrant, this was associated with nonbloody vomiting with decreased oral intake due to vomiting anything he eats, last vomiting was last night.  Wife at bedside states that fever has been up to 101F (last night), last Tylenol was this morning, he denies chest pain, shortness of breath, headache, diarrhea. Patient was admitted from 3/13 to 3/17 at [facility] due to biliary obstruction in setting of metastatic adenocarcinoma during which ERCP was done on 10/23/2022 s/p biliary stent placement in the left hepatic duct.  ED Course:  In the emergency department, temperature was 98.5, respiratory rate 17/min, pulse 97 bpm, blood pressure 86/56, O2 sat was 98% on room air.  Workup in the ED showed WBC 24.3, hemoglobin 9.3, hematocrit 27.2, MCV 98.2, platelets 92.  BMP showed sodium 124, potassium 4.5, chloride 96, bicarb 20, blood glucose 164, BUN/creatinine 37/2.51 (baseline creatinine at 1.4 -1.6), albumin 1.9, AST 92, ALT 59, ALP 365.  Total bilirubin 4.0, EGFR 28 lactic acid 3.3 > 3.8.  Urinalysis was unimpressive for UTI influenza A, B, SARS coronavirus 2, RSV was negative.  Blood culture was pending. Chest x-ray showed no acute cardiopulmonary  disease CT abdomen and pelvis without contrast showed 1. Interval increased volume of ascites with mild nodularity throughout the omental and mesenteric fat, suspicious for carcinomatosis. 2. Interval placement of a plastic biliary stent through the metallic wall stent which appears well positioned. However, there is increased intrahepatic biliary dilatation and no pneumobilia, suspicious for stent dysfunction. Correlate with liver function studies. 3. Previous embolization procedure for intrahepatic cholangiocarcinoma. No recurrent focal mass lesion identified. 4. No evidence of bowel obstruction or perforation. Possible mild small bowel wall thickening in the mid abdomen. 5. Stable 4 mm right middle lobe pulmonary nodule. 6. Stable calcified bladder calculus. 7. Interval increased diffuse subcutaneous edema, asymmetric to the right. 8.  Aortic Atherosclerosis Patient was empirically treated with IV vancomycin, metronidazole and cefepime.  IV LR was given. Gastroenterology at [facility] gastroenterology) was consulted and recommended admitting patient to [facility]  Hospitalist was asked admit patient for further evaluation and management.  Review of Systems: Review of systems as noted in the HPI. All other systems reviewed and are negative.   Past Medical History:  Diagnosis Date   Cancer ([place])    CHF (congestive heart failure) (HCC)    Chronic pain    neck, back, knees   Class 1 obesity due to excess calories with body mass index (BMI) of 31.0 to 31.9 in adult    Claudication [facility])    Coronary artery disease    DDD (degenerative disc disease), cervical    Diabetes mellitus ([place])    Dyslipidemia    Hypertension    Port-A-Cath in place 07/20/2021   Tobacco dependence    Past Surgical  History:  Procedure Laterality Date   BILIARY BRUSHING N/A 02/25/2021   Procedure: BILIARY BRUSHING;  Surgeon: Rogene Houston, MD;  Location: [facility];  Service: Gastroenterology;  Laterality:  N/A;   BILIARY DILATION  10/23/2022   Procedure: BILIARY DILATION;  Surgeon: Rush Landmark Telford Nab., MD;  Location: [place];  Service: Gastroenterology;;   BILIARY STENT PLACEMENT N/A 02/25/2021   Procedure: BILIARY STENT PLACEMENT;  Surgeon: Rogene Houston, MD;  Location: [facility];  Service: Gastroenterology;  Laterality: N/A;   BILIARY STENT PLACEMENT  02/27/2021   Procedure: BILIARY STENT PLACEMENT (10FR x 9cm) IN THE RIGHT SYSTEM;  Surgeon: Rogene Houston, MD;  Location: [facility];  Service: Gastroenterology;;   BILIARY STENT PLACEMENT  10/23/2022   Procedure: BILIARY STENT PLACEMENT;  Surgeon: Irving Copas., MD;  Location: [place];  Service: Gastroenterology;;   BREAST SURGERY Left    benign lump- in his 24s.   CARDIAC CATHETERIZATION     ERCP N/A 02/25/2021   Procedure: ENDOSCOPIC RETROGRADE CHOLANGIOPANCREATOGRAPHY (ERCP);  Surgeon: Rogene Houston, MD;  Location: [facility];  Service: Gastroenterology;  Laterality: N/A;   ERCP N/A 02/27/2021   Procedure: ENDOSCOPIC RETROGRADE CHOLANGIOPANCREATOGRAPHY (ERCP);  Surgeon: Rogene Houston, MD;  Location: [facility];  Service: Gastroenterology;  Laterality: N/A;   ERCP N/A 10/23/2022   Procedure: ENDOSCOPIC RETROGRADE CHOLANGIOPANCREATOGRAPHY (ERCP);  Surgeon: Irving Copas., MD;  Location: [place];  Service: Gastroenterology;  Laterality: N/A;   IR IMAGING GUIDED PORT INSERTION  07/21/2021   IR REMOVAL TUN ACCESS W/ PORT W/O FL MOD SED  09/30/2022   KNEE SURGERY Left    x2   LEFT HEART CATH AND CORONARY ANGIOGRAPHY N/A 09/08/2021   Procedure: LEFT HEART CATH AND CORONARY ANGIOGRAPHY;  Surgeon: Early Osmond, MD;  Location: [place] [facility];  Service: Cardiovascular;  Laterality: N/A;   REMOVAL OF STONES  10/23/2022   Procedure: REMOVAL OF STONES;  Surgeon: Rush Landmark Telford Nab., MD;  Location: [place];  Service: Gastroenterology;;   SPHINCTEROTOMY N/A 02/25/2021   Procedure: Joan Mayans;  Surgeon:  Rogene Houston, MD;  Location: [facility];  Service: Gastroenterology;  Laterality: N/A;   STENT REMOVAL  02/27/2021   Procedure: STENT REMOVAL (8.5Fr x 9cm);  Surgeon: Rogene Houston, MD;  Location: [facility];  Service: Gastroenterology;;    Social History:  reports that he has been smoking cigarettes. He has a 28.00 pack-year smoking history. He has never used smokeless tobacco. He reports that he does not currently use alcohol. He reports that he does not use drugs.   No Known Allergies  Family History  Problem Relation Age of Onset   CAD Mother    Cancer Neg Hx      Prior to Admission medications   Medication Sig Start Date End Date Taking? Authorizing Provider  acetaminophen (TYLENOL) 500 MG tablet Take 500 mg by mouth every 6 (six) hours as needed for moderate pain or headache.   Yes [provider]  albuterol (VENTOLIN HFA) 108 (90 Base) MCG/ACT inhaler Inhale 2 puffs into the lungs every 6 (six) hours as needed for wheezing or shortness of breath. 11/29/21  Yes Tat, Shanon Brow, MD  alfuzosin (UROXATRAL) 10 MG 24 hr tablet Take 1 tablet (10 mg total) by mouth at bedtime. 08/21/22  Yes McKenzie, Candee Furbish, MD  atorvastatin (LIPITOR) 40 MG tablet Take 1 tablet (40 mg total) by mouth daily at 6 PM. 12/05/21  Yes O'Neal, Cassie Freer, MD  carvedilol (COREG) 3.125 MG tablet  Take 1 tablet (3.125 mg total) by mouth 2 (two) times daily with a meal. 10/25/22 11/24/22 Yes Pahwani, Einar Grad, MD  CISplatin (PLATINOL) 50 MG/50ML SOLN Inject 50 mg into the vein once a week. On days 1 and 8, every 21 days   Yes [provider]  durvalumab 1,500 mg in sodium chloride 0.9 % 100 mL Inject 1,500 mg into the vein every 21 ( twenty-one) days.   Yes [provider]  famotidine (PEPCID) 20 MG tablet Take 20 mg by mouth 2 (two) times daily.   Yes [provider]  ferrous sulfate (FERROUSUL) 325 (65 FE) MG tablet Take 1 tablet (325 mg total) by mouth daily with breakfast. 03/18/21  Yes  Rehman, Mechele Dawley, MD  furosemide (LASIX) 20 MG tablet Take 20 mg by mouth as needed for fluid or edema. If he gains weight five pounds when taking chemo, take one tab a day till he is back to normal weight or within 2 pounds for fluid check before surgery.   Yes [provider]  gemcitabine 760 mg/m2 in sodium chloride 0.9 % 100 mL Inject 760 mg/m2 into the vein once a week. On days 1 and 8 every 21 days   Yes [provider]  guaifenesin (ROBITUSSIN) 100 MG/5ML syrup Take 200 mg by mouth 3 (three) times daily as needed for cough.   Yes [provider]  lisinopril (ZESTRIL) 2.5 MG tablet Take 1 tablet (2.5 mg total) by mouth daily. 09/11/22 09/06/23 Yes Mallipeddi, Vishnu P, MD  metFORMIN (GLUCOPHAGE) 500 MG tablet Take 500 mg by mouth 2 (two) times daily with a meal.   Yes [provider]  Misc. Devices KIT 1 Box by Does not apply route every 7 (seven) days. 10/20/22  Yes Derek Jack, MD  nitroGLYCERIN (NITROSTAT) 0.4 MG SL tablet Place 1 tablet (0.4 mg total) under the tongue every 5 (five) minutes as needed for chest pain. 09/12/21 11/07/22 Yes Duke, Tami Lin, PA  pantoprazole (PROTONIX) 40 MG tablet Take 1 tablet (40 mg total) by mouth daily. 12/05/21  Yes O'Neal, Cassie Freer, MD  prochlorperazine (COMPAZINE) 10 MG tablet TAKE (1) TABLET BY MOUTH EVERY (6) HOURS AS NEEDED. Patient taking differently: Take 10 mg by mouth every 6 (six) hours as needed for nausea or vomiting. TAKE (1) TABLET BY MOUTH EVERY (6) HOURS AS NEEDED. 09/28/22  Yes Derek Jack, MD  sodium chloride flush (NS) 0.9 % SOLN 10 mLs by Intracatheter route every 7 (seven) days. Patient taking differently: 10 mLs by Intracatheter route daily. 10/20/22  Yes Derek Jack, MD  Wound Dressings (INTRASITE GEL APPLIPAK) GEL Apply 1 Units topically daily. 10/05/22   Derek Jack, MD    Physical Exam: BP (!) 88/52 (BP Location: Left Arm)   Pulse (!) 113   Temp 98.6 F (37  C) (Oral)   Resp 20   Ht 5\' 11"  (1.803 m)   Wt 75.8 kg   SpO2 96%   BMI 23.29 kg/m   General: 67 y.o. year-old male ill appearing, but in no acute distress.  Alert and oriented x3. HEENT: NCAT, EOMI Neck: Supple, trachea medial, dry mucous membrane. Cardiovascular: Regular rate and rhythm with no rubs or gallops.  No thyromegaly or JVD noted.  No lower extremity edema. 2/4 pulses in all 4 extremities. Respiratory: Clear to auscultation with no wheezes or rales. Good inspiratory effort. Abdomen: Soft, mild tenderness to palpation, distended with normal bowel sounds x4 quadrants. Muskuloskeletal: No cyanosis, clubbing or edema noted bilaterally  Neuro: CN II-XII intact, strength 5/5 x 4, sensation, reflexes intact Skin: Jaundiced.  No ulcerative lesions noted  Psychiatry: Mood is appropriate for condition and setting          Labs on Admission:  Basic Metabolic Panel: Recent Labs  Lab 11/02/22 0748 11/07/22 1213  NA 129* 124*  128*  K 4.3 4.5  4.6  CL 98 96*  96*  CO2 21* 20*  GLUCOSE 153* 161*  155*  BUN 22 37*  32*  CREATININE 1.50* 2.51*  2.90*  CALCIUM 8.0* 7.9*  MG 1.7  --    Liver Function Tests: Recent Labs  Lab 11/02/22 0748 11/07/22 1213  AST 136* 92*  ALT 82* 59*  ALKPHOS 353* 365*  BILITOT 2.9* 4.0*  PROT 5.7* 5.6*  ALBUMIN 2.2* 1.9*   No results for input(s): "LIPASE", "AMYLASE" in the last 168 hours. No results for input(s): "AMMONIA" in the last 168 hours. CBC: Recent Labs  Lab 11/02/22 0748 11/07/22 1213  WBC 7.4 24.3*  NEUTROABS 5.3 21.6*  HGB 9.6* 9.3*  9.2*  HCT 28.0* 27.2*  27.0*  MCV 97.6 98.2  PLT 122* 92*   Cardiac Enzymes: No results for input(s): "CKTOTAL", "CKMB", "CKMBINDEX", "TROPONINI" in the last 168 hours.  BNP (last 3 results) Recent Labs    11/26/21 2132  BNP 518.0*    ProBNP (last 3 results) No results for input(s): "PROBNP" in the last 8760 hours.  CBG: No results for input(s): "GLUCAP" in the last  168 hours.  Radiological Exams on Admission: CT ABDOMEN PELVIS WO CONTRAST  Result Date: 11/07/2022 CLINICAL DATA:  Abdominal pain, acute, nonlocalized. Fever with weakness and dizziness for 3 days. Jaundice. History of intrahepatic cholangiocarcinoma. * Tracking Code: BO * EXAM: CT ABDOMEN AND PELVIS WITHOUT CONTRAST TECHNIQUE: Multidetector CT imaging of the abdomen and pelvis was performed following the standard protocol without IV contrast. RADIATION DOSE REDUCTION: This exam was performed according to the departmental dose-optimization program which includes automated exposure control, adjustment of the mA and/or kV according to patient size and/or use of iterative reconstruction technique. COMPARISON:  Abdominopelvic CT 08/28/2022. Abdominal MRI 10/19/2022. FINDINGS: Lower chest: 4 mm right middle lobe nodule on image 4/4 is unchanged from the prior chest CT. There is central airway thickening at both lung bases with mild dependent right lower lobe atelectasis. No consolidation or enlarging nodule identified. Aortic and coronary artery atherosclerosis noted. 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 Hepatobiliary: Previous embolization procedure for intrahepatic cholangiocarcinoma noted centrally in the liver, with associated beam hardening artifact. No recurrent focal mass lesion identified. Previously demonstrated known large gallstone is not well visualized. The gallbladder appears unchanged with mild wall thickening. A metallic biliary stent is in place with interval placement of a plastic biliary stent extending superiorly into the hepatic hilum and inferiorly into the ampulla. There is no pneumobilia, and the intrahepatic biliary dilatation appears slightly increased. Pancreas: Unremarkable. No pancreatic ductal dilatation or surrounding inflammatory changes. Spleen: Normal in size without focal abnormality. Adrenals/Urinary Tract: Both adrenal glands  appear normal. No evidence of renal or ureteral calculus, hydronephrosis or perinephric soft tissue stranding. Both kidneys appear unremarkable. A calcified bladder calculus is again noted. Foley catheter extends into the bladder lumen. The bladder is decompressed. Stomach/Bowel: No enteric contrast administered. The stomach appears unremarkable for its degree of distention. There is possible mild small bowel wall thickening in the mid abdomen, but no significant bowel distension. Mild diverticulosis of the distal  colon. The colon and appendix otherwise appear unremarkable. Vascular/Lymphatic: There are no enlarged abdominal or pelvic lymph nodes. Diffuse aortic and branch vessel atherosclerosis with chronically displaced intimal calcifications within the infrarenal abdominal aorta. No focal aneurysm. Reproductive: The prostate gland and seminal vesicles appear unremarkable. Other: Interval increased volume of ascites with mild nodularity throughout the omental and mesenteric fat, suspicious for carcinomatosis. No free intraperitoneal air. Musculoskeletal: No acute or significant osseous findings. Interval increased diffuse subcutaneous edema, asymmetric to the right. Stable mild lumbar spondylosis. IMPRESSION: 1. Interval increased volume of ascites with mild nodularity throughout the omental and mesenteric fat, suspicious for carcinomatosis. 2. Interval placement of a plastic biliary stent through the metallic wall stent which appears well positioned. However, there is increased intrahepatic biliary dilatation and no pneumobilia, suspicious for stent dysfunction. Correlate with liver function studies. 3. Previous embolization procedure for intrahepatic cholangiocarcinoma. No recurrent focal mass lesion identified. 4. No evidence of bowel obstruction or perforation. Possible mild small bowel wall thickening in the mid abdomen. 5. Stable 4 mm right middle lobe pulmonary nodule. 6. Stable calcified bladder calculus.  7. Interval increased diffuse subcutaneous edema, asymmetric to the right. 8.  Aortic Atherosclerosis (ICD10-I70.0). Electronically Signed   By: Richardean Sale M.D.   On: 11/07/2022 14:05   DG Chest Port 1 View  Result Date: 11/07/2022 CLINICAL DATA:  Questionable sepsis - evaluate for abnormality EXAM: PORTABLE CHEST - 1 VIEW COMPARISON:  04/09/2022 FINDINGS: Right arm PICC line has been placed to the distal SVC, the right IJ port removed. Lungs are clear. Heart size and mediastinal contours are within normal limits. No effusion. Visualized bones unremarkable. IMPRESSION: No acute cardiopulmonary disease. Electronically Signed   By: Lucrezia Europe M.D.   On: 11/07/2022 12:03    EKG: I independently viewed the EKG done and my findings are as followed: Normal sinus rhythm at a rate of 98 bpm  Assessment/Plan Present on Admission:  Biliary obstruction  Transaminitis  Leukocytosis  Ischemic cardiomyopathy  Hypoalbuminemia due to protein-calorie malnutrition (HCC)  Hyperbilirubinemia  Dehydration  Acute kidney injury superimposed on chronic kidney disease (HCC)  Nausea and vomiting  Hyponatremia  Mixed hyperlipidemia  Sepsis (HCC)  GERD (gastroesophageal reflux disease)  Principal Problem:   Metastatic cholangiocarcinoma to bile duct (HCC) Active Problems:   Mixed hyperlipidemia   Transaminitis   Leukocytosis   Hyponatremia   GERD (gastroesophageal reflux disease)   Hypoalbuminemia due to protein-calorie malnutrition (HCC)   Sepsis (HCC)   Hyperbilirubinemia   Dehydration   Nausea and vomiting   Ischemic cardiomyopathy   Acute kidney injury superimposed on chronic kidney disease (HCC)   Biliary obstruction   Lactic acidosis   Prediabetes   Malignant ascites   Abdominal distention  Sepsis secondary to biliary obstruction in setting of metastatic cholangiocarcinoma suspected to be due to biliary stent dysfunction Patient met sepsis criteria due to tachycardia and leukocytosis  (met SIRS criteria), abdomen/, bile duct suspected to be source of infection, lactic acid was elevated. IR will be consulted for possible paracentesis and to rule out for SBP Empiric antibiotics with cefepime, metronidazole and vancomycin was provided, we shall continue vancomycin and cefepime with plan to de-escalate/discontinue based on blood culture and ascitic fluid findings Gastroenterology was consulted and recommended admitting patient to [facility] with plan to consult on patient on arrival to [facility].  Transaminasemia in the setting of above AST 92 (decreased from 136 on 11/02/2022), ALT 59 (decreased from 82  on 11/02/2022) ALP 365 (increased from 353 on  11/02/2022).  Total bilirubin 4.0 (increased from 2.9 on 11/02/2022) Patient with history of metastatic cholangiocarcinoma and recent stent placement status post ERCP. Gastroenterology already consulted as described above  Lactic acidosis in the setting of sepsis due to biliary obstruction Lactic acid 3.3 > 3.8; continue IV hydration and continue to trend lactic acid  Nausea and vomiting Continue Zofran as needed  Acute kidney injury on CKD 4 Dehydration BUN/creatinine 37/2.51 (baseline creatinine at 1.4 -1.6) Continue gentle hydration Renally adjust medications, avoid nephrotoxic agents/dehydration/hypotension  Hyponatremia possibly secondary to dehydration Sodium 124; baseline between 127 to 133 Continue to monitor sodium with serial BMPs Urine osmolality, serum osmolality and urine sodium will be checked  Hyperglycemia possibly due to prediabetes Blood glucose was 161, hemoglobin A1c was 5.8 on 09/08/2021 Continue diet modification at this time  Thrombocytopenia possibly due to liver dysfunction in the setting of cholangiocarcinoma Platelets 92, continue to monitor platelet levels  Hypoalbuminemia possibly secondary to severe calorie malnutrition Albumin 1.9, protein supplement will be provided Patient may require albumin  infusion during paracentesis Dietitian will be consulted, shall await further recommendation  GERD Continue Protonix  Mixed hyperlipidemia Statin will be held at this time due to elevated liver enzymes  CAD with ischemic cardiomyopathy (EF 45-50%) Patient denies chest pain.  Coreg and lisinopril will be temporarily held at this time due to soft BP  Malignant ascites/abdominal distention Patient had paracentesis on 3/11 with 3.5 L fluid pulled off, he received 50 g IV albumin with procedure. Last paracentesis was done on 3/25 and 3.3 L of peritoneal fluid was removed IR consulted for possible paracentesis  Metastatic cholangiocarcinoma Patient follows with Dr. Delton Coombes and last visit was on 11/02/2022  DVT prophylaxis: SCDs  Code Status: Full code  Consults: Gastroenterology, dietitian, IR  Family Communication: Wife at bedside  Severity of Illness: The appropriate patient status for this patient is INPATIENT. Inpatient status is judged to be reasonable and necessary in order to provide the required intensity of service to ensure the patient's safety. The patient's presenting symptoms, physical exam findings, and initial radiographic and laboratory data in the context of their chronic comorbidities is felt to place them at high risk for further clinical deterioration. Furthermore, it is not anticipated that the patient will be medically stable for discharge from the hospital within 2 midnights of admission.   * I certify that at the point of admission it is my clinical judgment that the patient will require inpatient hospital care spanning beyond 2 midnights from the point of admission due to high intensity of service, high risk for further deterioration and high frequency of surveillance required.*  Author: Bernadette Hoit, DO 11/07/2022 7:17 PM  For on call review [email or web address].

## 2022-11-07 NOTE — Progress Notes (Signed)
Pharmacy Antibiotic Note  Jonathan Keith is a 67 y.o. male admitted on 11/07/2022 with  intra-abdominal infection .  Pharmacy has been consulted for Vancomycin dosing. Patient with Metastatic cholangiocarcinoma. He had recent hospitalization due to elevated bilirubin.  He underwent ERCP on 10/23/2022 with stent placement in the left hepatic duct. Now presents with fever and abdominal pain  Plan: Vancomycin 1500mg  IV loading dose then 750 mg IV Q 24 hrs. Goal AUC 400-550. Expected AUC: 460 SCr used: 2.51 F/u cxs and clinical progress Monitor V/S, labs and levels as indicated   Height: 5\' 11"  (180.3 cm) Weight: 75.8 kg (167 lb) IBW/kg (Calculated) : 75.3  Temp (24hrs), Avg:98.5 F (36.9 C), Min:98.5 F (36.9 C), Max:98.5 F (36.9 C)  Recent Labs  Lab 11/02/22 0748 11/07/22 1213  WBC 7.4 24.3*  CREATININE 1.50* 2.51*  2.90*  LATICACIDVEN  --  3.3*    Estimated Creatinine Clearance: 30.8 mL/min (A) (by C-G formula based on SCr of 2.51 mg/dL (H)).    No Known Allergies  Antimicrobials this admission: Vancomycin 3/30 >>  Cefepime/flagyl in ED 3/30  Microbiology results: 3/30 BCx: pending 3/30 UCx: pending   MRSA PCR:   Thank you for allowing pharmacy to be a part of this patient's care.  Isac Sarna, BS Pharm D, BCPS Clinical Pharmacist 11/07/2022 2:13 PM

## 2022-11-07 NOTE — ED Notes (Addendum)
Callled to [facility]

## 2022-11-07 NOTE — ED Provider Notes (Signed)
[facility] Provider Note  CSN: [id number] Arrival date & time: 11/07/22 1020  Chief Complaint(s) Fever  HPI Jonathan Keith is a 67 y.o. male history of diabetes, CHF, cholangiocarcinoma with bili obstruction, recent stent placement presenting with fevers.  Patient's wife reports that he has had fevers and generalized weakness for the past 3 days.  He also recently had a Foley catheter placed and a paracentesis.  He reports mild generalized abdominal pain, worse in the right upper quadrant.  He does have some chronic right upper quadrant pain.  Wife notes that he has had some intermittent coughing as well over the past few days, nonproductive.  He has had generalized weakness with no focal weakness.  Highest fever was 101, received Tylenol last this morning.  No chest pain, diarrhea.   Past Medical History Past Medical History:  Diagnosis Date   Cancer ([place])    CHF (congestive heart failure) (HCC)    Chronic pain    neck, back, knees   Class 1 obesity due to excess calories with body mass index (BMI) of 31.0 to 31.9 in adult    Claudication [facility])    Coronary artery disease    DDD (degenerative disc disease), cervical    Diabetes mellitus ([place])    Dyslipidemia    Hypertension    Port-A-Cath in place 07/20/2021   Tobacco dependence    Patient Active Problem List   Diagnosis Date Noted   History of ERCP 10/24/2022   History of biliary duct stent placement 10/24/2022   Abnormal LFTs 10/24/2022   Biliary obstruction 10/24/2022   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 ([place]) 10/21/2022   Chronic kidney disease, stage 3a ([place]) 10/21/2022   Anemia of chronic disease 10/21/2022   Ischemic cardiomyopathy 09/11/2022   AKI (acute kidney injury) ([place]) 09/11/2022   Lobar pneumonia ([place]) 11/28/2021   Acute on chronic systolic CHF (congestive heart failure) ([place]) 11/27/2021   Coronary artery disease 11/27/2021   Tobacco abuse 11/27/2021   Acute  respiratory failure with hypoxia ([place]) 11/27/2021   Elevated troponin 11/27/2021   NSTEMI (non-ST elevated myocardial infarction) ([place]) 09/08/2021   ACS (acute coronary syndrome) ([place]) 09/08/2021   Nausea without vomiting 09/01/2021   Dehydration 07/23/2021   Port-A-Cath in place 07/20/2021   Atherosclerosis of native arteries of extremities with intermittent claudication, bilateral legs ([place]) 06/30/2021   Abnormal levels of other serum enzymes 06/30/2021   Nicotine dependence, cigarettes, uncomplicated 0000000   Biliary tract cancer ([place]) 06/12/2021   Hyperbilirubinemia    Sepsis ([place]) 05/25/2021   SIRS (systemic inflammatory response syndrome) ([place]) 04/28/2021   Cholangiocarcinoma ([place]) 04/10/2021   Calculus of bile duct without cholangitis or cholecystitis without obstruction 04/10/2021   Acute febrile illness 03/12/2021   Acute cholecystitis 03/12/2021   Diverticulitis 03/12/2021   Transaminitis 03/12/2021   Leukocytosis 03/12/2021   Hyponatremia 03/12/2021   Hyperglycemia due to diabetes mellitus ([place]) 03/12/2021   GERD (gastroesophageal reflux disease) 03/12/2021   Hypoalbuminemia due to protein-calorie malnutrition ([place]) 03/12/2021   Type 2 diabetes mellitus without complications ([place]) [id number]   Gastroesophageal reflux disease 03/12/2021   Elevated LFTs    Choledocholithiasis    Obstructive jaundice 02/24/2021   Class 1 obesity due to excess calories with body mass index (BMI) of 31.0 to 31.9 in adult 02/24/2021   Controlled diabetes mellitus type 2 with complications ([place]) [id number]   HLD (hyperlipidemia) 02/24/2021   Hypertension 02/24/2021   Tobacco dependence 02/24/2021   Elevated blood-pressure reading, without  diagnosis of hypertension 02/24/2021   Home Medication(s) Prior to Admission medications   Medication Sig Start Date End Date Taking? Authorizing Provider  acetaminophen (TYLENOL) 500 MG tablet Take 500 mg by mouth every 6 (six) hours as needed for  moderate pain or headache.    [provider]  albuterol (VENTOLIN HFA) 108 (90 Base) MCG/ACT inhaler Inhale 2 puffs into the lungs every 6 (six) hours as needed for wheezing or shortness of breath. 11/29/21   Tat, Shanon Brow, MD  alfuzosin (UROXATRAL) 10 MG 24 hr tablet Take 1 tablet (10 mg total) by mouth at bedtime. 08/21/22   McKenzie, Candee Furbish, MD  atorvastatin (LIPITOR) 40 MG tablet Take 1 tablet (40 mg total) by mouth daily at 6 PM. 12/05/21   O'Neal, Cassie Freer, MD  carvedilol (COREG) 3.125 MG tablet Take 1 tablet (3.125 mg total) by mouth 2 (two) times daily with a meal. 10/25/22 11/24/22  Darliss Cheney, MD  CISPLATIN IV Inject into the vein once a week. Days 1, 8 every 21 days 07/22/21   [provider]  Durvalumab (IMFINZI IV) Inject into the vein every 21 ( twenty-one) days. 07/22/21   [provider]  famotidine (PEPCID) 20 MG tablet Take 20 mg by mouth 2 (two) times daily.    [provider]  ferrous sulfate (FERROUSUL) 325 (65 FE) MG tablet Take 1 tablet (325 mg total) by mouth daily with breakfast. 03/18/21   Rehman, Mechele Dawley, MD  furosemide (LASIX) 20 MG tablet Take 20 mg by mouth as needed for fluid or edema. If he gains weight five pounds when taking chemo, take one tab a day till he is back to normal weight or within 2 pounds for fluid check before surgery.    [provider]  Gemcitabine HCl (GEMZAR IV) Inject into the vein once a week. Days 1, 8 every 21 days 07/22/21   [provider]  Ginseng 100 MG CAPS Take 1 capsule by mouth daily.    [provider]  lisinopril (ZESTRIL) 2.5 MG tablet Take 1 tablet (2.5 mg total) by mouth daily. 09/11/22 09/06/23  Mallipeddi, Vishnu P, MD  metFORMIN (GLUCOPHAGE) 500 MG tablet Take 1 tablet (500 mg total) by mouth 2 (two) times daily with a meal. 05/29/21 01/13/22  Dessa Phi, DO  metFORMIN (GLUCOPHAGE) 500 MG tablet Take 500 mg by mouth 2 (two) times daily with a meal.    [provider]  Misc. Devices KIT 1 Box by Does not apply route every 7 (seven) days. 10/20/22   Derek Jack, MD  nitroGLYCERIN (NITROSTAT) 0.4 MG SL tablet Place 1 tablet (0.4 mg total) under the tongue every 5 (five) minutes as needed for chest pain. 09/12/21 10/22/22  Ledora Bottcher, PA  pantoprazole (PROTONIX) 40 MG tablet Take 1 tablet (40 mg total) by mouth daily. 12/05/21   O'Neal, Cassie Freer, MD  prochlorperazine (COMPAZINE) 10 MG tablet TAKE (1) TABLET BY MOUTH EVERY (6) HOURS AS NEEDED. Patient taking differently: Take 10 mg by mouth every 6 (six) hours as needed for nausea or vomiting. TAKE (1) TABLET BY MOUTH EVERY (6) HOURS AS NEEDED. 09/28/22   Derek Jack, MD  sodium chloride flush (NS) 0.9 % SOLN 10 mLs by Intracatheter route every 7 (seven) days. Patient taking differently: 10 mLs by Intracatheter route daily. 10/20/22   Derek Jack, MD  tamsulosin (FLOMAX) 0.4 MG CAPS capsule TAKE (2) CAPSULES BY MOUTH AT BEDTIME. Patient taking differently: Take 0.8 mg by mouth at bedtime.  TAKE (2) CAPSULES BY MOUTH AT BEDTIME. 05/08/22   Derek Jack, MD  Vitamin D, Ergocalciferol, (DRISDOL) 1.25 MG (50000 UNIT) CAPS capsule Take 1 capsule by mouth once a week. 06/29/22   [provider]  Wound Dressings (INTRASITE GEL APPLIPAK) GEL Apply 1 Units topically daily. 10/05/22   Derek Jack, MD                                                                                                                                    Past Surgical History Past Surgical History:  Procedure Laterality Date   BILIARY BRUSHING N/A 02/25/2021   Procedure: BILIARY BRUSHING;  Surgeon: Rogene Houston, MD;  Location: [facility];  Service: Gastroenterology;  Laterality: N/A;   BILIARY DILATION  10/23/2022   Procedure: BILIARY DILATION;  Surgeon: Rush Landmark Telford Nab., MD;  Location: [facility];  Service: Gastroenterology;;   BILIARY STENT PLACEMENT N/A 02/25/2021    Procedure: BILIARY STENT PLACEMENT;  Surgeon: Rogene Houston, MD;  Location: [facility];  Service: Gastroenterology;  Laterality: N/A;   BILIARY STENT PLACEMENT  02/27/2021   Procedure: BILIARY STENT PLACEMENT (10FR x 9cm) IN THE RIGHT SYSTEM;  Surgeon: Rogene Houston, MD;  Location: [facility];  Service: Gastroenterology;;   BILIARY STENT PLACEMENT  10/23/2022   Procedure: BILIARY STENT PLACEMENT;  Surgeon: Irving Copas., MD;  Location: [facility];  Service: Gastroenterology;;   BREAST SURGERY Left    benign lump- in his 52s.   CARDIAC CATHETERIZATION     ERCP N/A 02/25/2021   Procedure: ENDOSCOPIC RETROGRADE CHOLANGIOPANCREATOGRAPHY (ERCP);  Surgeon: Rogene Houston, MD;  Location: [facility];  Service: Gastroenterology;  Laterality: N/A;   ERCP N/A 02/27/2021   Procedure: ENDOSCOPIC RETROGRADE CHOLANGIOPANCREATOGRAPHY (ERCP);  Surgeon: Rogene Houston, MD;  Location: [facility];  Service: Gastroenterology;  Laterality: N/A;   ERCP N/A 10/23/2022   Procedure: ENDOSCOPIC RETROGRADE CHOLANGIOPANCREATOGRAPHY (ERCP);  Surgeon: Irving Copas., MD;  Location: [facility];  Service: Gastroenterology;  Laterality: N/A;   IR IMAGING GUIDED PORT INSERTION  07/21/2021   IR REMOVAL TUN ACCESS W/ PORT W/O FL MOD SED  09/30/2022   KNEE SURGERY Left    x2   LEFT HEART CATH AND CORONARY ANGIOGRAPHY N/A 09/08/2021   Procedure: LEFT HEART CATH AND CORONARY ANGIOGRAPHY;  Surgeon: Early Osmond, MD;  Location: [facility];  Service: Cardiovascular;  Laterality: N/A;   REMOVAL OF STONES  10/23/2022   Procedure: REMOVAL OF STONES;  Surgeon: Rush Landmark Telford Nab., MD;  Location: [facility];  Service: Gastroenterology;;   SPHINCTEROTOMY N/A 02/25/2021   Procedure: Joan Mayans;  Surgeon: Rogene Houston, MD;  Location[facility];  Service: Gastroenterology;  Laterality: N/A;   STENT REMOVAL  02/27/2021   Procedure: STENT REMOVAL (8.5Fr x 9cm);  Surgeon: Rogene Houston, MD;  Location: [facility];  Service: Gastroenterology;;   Family History Family History  Problem Relation Age of Onset  CAD Mother    Cancer Neg Hx     Social History Social History   Tobacco Use   Smoking status: Every Day    Packs/day: 0.50    Years: 56.00    Additional pack years: 0.00    Total pack years: 28.00    Types: Cigarettes   Smokeless tobacco: Never  Vaping Use   Vaping Use: Never used  Substance Use Topics   Alcohol use: Not Currently    Comment: stopped 2.5 years ago 03/11/21   Drug use: Never   Allergies Patient has no known allergies.  Review of Systems Review of Systems  All other systems reviewed and are negative.   Physical Exam Vital Signs  I have reviewed the triage vital signs BP 117/76 (BP Location: Right Arm)   Pulse (!) 120   Temp 98.4 F (36.9 C) (Oral)   Resp 18   Ht 5\' 11"  (1.803 m)   Wt 75.8 kg   SpO2 98%   BMI 23.29 kg/m  Physical Exam Vitals and nursing note reviewed.  Constitutional:      General: He is not in acute distress.    Appearance: Normal appearance. He is ill-appearing.  HENT:     Mouth/Throat:     Mouth: Mucous membranes are dry.  Eyes:     General: Scleral icterus present.     Conjunctiva/sclera: Conjunctivae normal.  Cardiovascular:     Rate and Rhythm: Normal rate and regular rhythm.  Pulmonary:     Effort: Pulmonary effort is normal. No respiratory distress.     Breath sounds: Normal breath sounds.  Abdominal:     General: Abdomen is flat. There is distension.     Palpations: Abdomen is soft.     Tenderness: There is abdominal tenderness (mild RUQ and diffuse ttp).  Musculoskeletal:     Right lower leg: No edema.     Left lower leg: No edema.  Skin:    General: Skin is warm and dry.     Capillary Refill: Capillary refill takes less than 2 seconds.     Coloration: Skin is jaundiced.  Neurological:     Mental Status: He is alert and oriented to person, place, and time. Mental status is at baseline.  Psychiatric:         Mood and Affect: Mood normal.        Behavior: Behavior normal.     ED Results and Treatments Labs (all labs ordered are listed, but only abnormal results are displayed) Labs Reviewed  LACTIC ACID, PLASMA - Abnormal; Notable for the following components:      Result Value   Lactic Acid, Venous 3.3 (*)    All other components within normal limits  LACTIC ACID, PLASMA - Abnormal; Notable for the following components:   Lactic Acid, Venous 3.8 (*)    All other components within normal limits  COMPREHENSIVE METABOLIC PANEL - Abnormal; Notable for the following components:   Sodium 124 (*)    Chloride 96 (*)    CO2 20 (*)    Glucose, Bld 161 (*)    BUN 37 (*)    Creatinine, Ser 2.51 (*)    Calcium 7.9 (*)    Total Protein 5.6 (*)    Albumin 1.9 (*)    AST 92 (*)    ALT 59 (*)    Alkaline Phosphatase 365 (*)    Total Bilirubin 4.0 (*)    GFR, Estimated 28 (*)    All other components within  normal limits  CBC WITH DIFFERENTIAL/PLATELET - Abnormal; Notable for the following components:   WBC 24.3 (*)    RBC 2.77 (*)    Hemoglobin 9.3 (*)    HCT 27.2 (*)    RDW 15.9 (*)    Platelets 92 (*)    Neutro Abs 21.6 (*)    Lymphs Abs 0.6 (*)    Monocytes Absolute 1.9 (*)    Abs Immature Granulocytes 0.27 (*)    All other components within normal limits  PROTIME-INR - Abnormal; Notable for the following components:   Prothrombin Time 16.2 (*)    INR 1.3 (*)    All other components within normal limits  APTT - Abnormal; Notable for the following components:   aPTT 39 (*)    All other components within normal limits  URINALYSIS, ROUTINE W REFLEX MICROSCOPIC - Abnormal; Notable for the following components:   Color, Urine AMBER (*)    APPearance CLOUDY (*)    Hgb urine dipstick SMALL (*)    Protein, ur 30 (*)    Bacteria, UA RARE (*)    All other components within normal limits  I-STAT CHEM 8, ED - Abnormal; Notable for the following components:   Sodium 128 (*)    Chloride  96 (*)    BUN 32 (*)    Creatinine, Ser 2.90 (*)    Glucose, Bld 155 (*)    Calcium, Ion 1.13 (*)    TCO2 21 (*)    Hemoglobin 9.2 (*)    HCT 27.0 (*)    All other components within normal limits  RESP PANEL BY RT-PCR (RSV, FLU A&B, COVID)  [id number]  CULTURE, BLOOD (ROUTINE X 2)  CULTURE, BLOOD (ROUTINE X 2)                                                                                                                          Radiology CT ABDOMEN PELVIS WO CONTRAST  Result Date: 11/07/2022 CLINICAL DATA:  Abdominal pain, acute, nonlocalized. Fever with weakness and dizziness for 3 days. Jaundice. History of intrahepatic cholangiocarcinoma. * Tracking Code: BO * EXAM: CT ABDOMEN AND PELVIS WITHOUT CONTRAST TECHNIQUE: Multidetector CT imaging of the abdomen and pelvis was performed following the standard protocol without IV contrast. RADIATION DOSE REDUCTION: This exam was performed according to the departmental dose-optimization program which includes automated exposure control, adjustment of the mA and/or kV according to patient size and/or use of iterative reconstruction technique. COMPARISON:  Abdominopelvic CT 08/28/2022. Abdominal MRI 10/19/2022. FINDINGS: Lower chest: 4 mm right middle lobe nodule on image 4/4 is unchanged from the prior chest CT. There is central airway thickening at both lung bases with mild dependent right lower lobe atelectasis. No consolidation or enlarging nodule identified. Aortic and coronary artery atherosclerosis noted. 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 Hepatobiliary: Previous embolization procedure for intrahepatic cholangiocarcinoma noted centrally in the liver, with associated beam hardening artifact. No recurrent focal mass  lesion identified. Previously demonstrated known large gallstone is not well visualized. The gallbladder appears unchanged with mild wall thickening. A metallic biliary stent is in place with  interval placement of a plastic biliary stent extending superiorly into the hepatic hilum and inferiorly into the ampulla. There is no pneumobilia, and the intrahepatic biliary dilatation appears slightly increased. Pancreas: Unremarkable. No pancreatic ductal dilatation or surrounding inflammatory changes. Spleen: Normal in size without focal abnormality. Adrenals/Urinary Tract: Both adrenal glands appear normal. No evidence of renal or ureteral calculus, hydronephrosis or perinephric soft tissue stranding. Both kidneys appear unremarkable. A calcified bladder calculus is again noted. Foley catheter extends into the bladder lumen. The bladder is decompressed. Stomach/Bowel: No enteric contrast administered. The stomach appears unremarkable for its degree of distention. There is possible mild small bowel wall thickening in the mid abdomen, but no significant bowel distension. Mild diverticulosis of the distal colon. The colon and appendix otherwise appear unremarkable. Vascular/Lymphatic: There are no enlarged abdominal or pelvic lymph nodes. Diffuse aortic and branch vessel atherosclerosis with chronically displaced intimal calcifications within the infrarenal abdominal aorta. No focal aneurysm. Reproductive: The prostate gland and seminal vesicles appear unremarkable. Other: Interval increased volume of ascites with mild nodularity throughout the omental and mesenteric fat, suspicious for carcinomatosis. No free intraperitoneal air. Musculoskeletal: No acute or significant osseous findings. Interval increased diffuse subcutaneous edema, asymmetric to the right. Stable mild lumbar spondylosis. IMPRESSION: 1. Interval increased volume of ascites with mild nodularity throughout the omental and mesenteric fat, suspicious for carcinomatosis. 2. Interval placement of a plastic biliary stent through the metallic wall stent which appears well positioned. However, there is increased intrahepatic biliary dilatation and no  pneumobilia, suspicious for stent dysfunction. Correlate with liver function studies. 3. Previous embolization procedure for intrahepatic cholangiocarcinoma. No recurrent focal mass lesion identified. 4. No evidence of bowel obstruction or perforation. Possible mild small bowel wall thickening in the mid abdomen. 5. Stable 4 mm right middle lobe pulmonary nodule. 6. Stable calcified bladder calculus. 7. Interval increased diffuse subcutaneous edema, asymmetric to the right. 8.  Aortic Atherosclerosis (ICD10-I70.0). Electronically Signed   By: Richardean Sale M.D.   On: 11/07/2022 14:05   DG Chest Port 1 View  Result Date: 11/07/2022 CLINICAL DATA:  Questionable sepsis - evaluate for abnormality EXAM: PORTABLE CHEST - 1 VIEW COMPARISON:  04/09/2022 FINDINGS: Right arm PICC line has been placed to the distal SVC, the right IJ port removed. Lungs are clear. Heart size and mediastinal contours are within normal limits. No effusion. Visualized bones unremarkable. IMPRESSION: No acute cardiopulmonary disease. Electronically Signed   By: Lucrezia Europe M.D.   On: 11/07/2022 12:03    Pertinent labs & imaging results that were available during my care of the patient were reviewed by me and considered in my medical decision making (see MDM for details).  Medications Ordered in ED Medications  lactated ringers infusion ( Intravenous New Bag/Given 11/07/22 1246)  vancomycin (VANCOREADY) IVPB 750 mg/150 mL (has no administration in time range)  ceFEPIme (MAXIPIME) 2 g in sodium chloride 0.9 % 100 mL IVPB (0 g Intravenous Stopped 11/07/22 1306)  metroNIDAZOLE (FLAGYL) IVPB 500 mg (0 mg Intravenous Stopped 11/07/22 1403)  lactated ringers bolus 1,000 mL (0 mLs Intravenous Stopped 11/07/22 1502)  vancomycin (VANCOREADY) IVPB 1500 mg/300 mL (0 mg Intravenous Stopped 11/07/22 1456)  lidocaine (XYLOCAINE) 2 % jelly 1 Application (1 Application Urethral Given 11/07/22 1259)  Procedures .Critical Care  Performed by: Cristie Hem, MD Authorized by: Cristie Hem, MD   Critical care provider statement:    Critical care time (minutes):  30   Critical care was necessary to treat or prevent imminent or life-threatening deterioration of the following conditions:  Sepsis   Critical care was time spent personally by me on the following activities:  Development of treatment plan with patient or surrogate, discussions with consultants, evaluation of patient's response to treatment, examination of patient, ordering and review of laboratory studies, ordering and review of radiographic studies, ordering and performing treatments and interventions, pulse oximetry, re-evaluation of patient's condition and review of old charts   (including critical care time)  Medical Decision Making / ED Course   MDM:  67 year old male presenting to the emergency department with weakness.  Patient appears chronically ill but also acutely ill.  He feels warm but oral temperature was normal.  Would not be surprised if he has an elevated core temp, asked nursing to obtain core temp.  He also has low blood pressure.  He has had some low blood pressure on outpatient checks, but with fever, concerning for sepsis.  Source could be multiple causes.  He recently had a biliary stent placed, could represent cholangitis, will obtain CT abdomen to evaluate for biliary dilatation, also obtain LFTs.  Could also be pneumonia given cough but less likely with no focal breath sounds on exam.  He also recently had a Foley catheter placed, will obtain urinalysis after replacing catheter to evaluate for urinary infection.  Further he also had a paracentesis recently, will evaluate for diagnostic paracentesis as he is at high risk for SBP.  Will defer entire sepsis fluid bolus rather will give smaller bolus as patient has CHF.   Will cover with antibiotics.  Will reassess closely.  Clinical Course as of 11/07/22 1512  Sat Nov 07, 2022  1423 CT ABDOMEN PELVIS WO CONTRAST [WS]  [id number] CT scan shows increased biliary dilatation concerning for stent malfunction.  Bedside ultrasound without significant ascites to perform paracentesis with.  Discussed with GI on-call Dr. Jenetta Downer who agrees that patient may need repeat ERCP and recommends transfer to [facility] given that this is not available at [facility].  Will page [facility] [WS]  1509 Signed out to Dr. Pearline Cables pending [facility] consult, anticipate admission to [facility] given need for ERCP. [WS]    Clinical Course User Index [WS] Cristie Hem, MD     Additional history obtained: -Additional history obtained from spouse -External records from outside source obtained and reviewed including: Chart review including previous notes, labs, imaging, consultation notes including recent admission for stent placement   Lab Tests: -I ordered, reviewed, and interpreted labs.   The pertinent results include:   Labs Reviewed  LACTIC ACID, PLASMA - Abnormal; Notable for the following components:      Result Value   Lactic Acid, Venous 3.3 (*)    All other components within normal limits  LACTIC ACID, PLASMA - Abnormal; Notable for the following components:   Lactic Acid, Venous 3.8 (*)    All other components within normal limits  COMPREHENSIVE METABOLIC PANEL - Abnormal; Notable for the following components:   Sodium 124 (*)    Chloride 96 (*)    CO2 20 (*)    Glucose, Bld 161 (*)    BUN 37 (*)    Creatinine, Ser 2.51 (*)    Calcium 7.9 (*)    Total Protein 5.6 (*)  Albumin 1.9 (*)    AST 92 (*)    ALT 59 (*)    Alkaline Phosphatase 365 (*)    Total Bilirubin 4.0 (*)    GFR, Estimated 28 (*)    All other components within normal limits  CBC WITH DIFFERENTIAL/PLATELET - Abnormal; Notable for the following components:   WBC 24.3 (*)    RBC 2.77 (*)    Hemoglobin  9.3 (*)    HCT 27.2 (*)    RDW 15.9 (*)    Platelets 92 (*)    Neutro Abs 21.6 (*)    Lymphs Abs 0.6 (*)    Monocytes Absolute 1.9 (*)    Abs Immature Granulocytes 0.27 (*)    All other components within normal limits  PROTIME-INR - Abnormal; Notable for the following components:   Prothrombin Time 16.2 (*)    INR 1.3 (*)    All other components within normal limits  APTT - Abnormal; Notable for the following components:   aPTT 39 (*)    All other components within normal limits  URINALYSIS, ROUTINE W REFLEX MICROSCOPIC - Abnormal; Notable for the following components:   Color, Urine AMBER (*)    APPearance CLOUDY (*)    Hgb urine dipstick SMALL (*)    Protein, ur 30 (*)    Bacteria, UA RARE (*)    All other components within normal limits  I-STAT CHEM 8, ED - Abnormal; Notable for the following components:   Sodium 128 (*)    Chloride 96 (*)    BUN 32 (*)    Creatinine, Ser 2.90 (*)    Glucose, Bld 155 (*)    Calcium, Ion 1.13 (*)    TCO2 21 (*)    Hemoglobin 9.2 (*)    HCT 27.0 (*)    All other components within normal limits  RESP PANEL BY RT-PCR (RSV, FLU A&B, COVID)  [id number]  CULTURE, BLOOD (ROUTINE X 2)  CULTURE, BLOOD (ROUTINE X 2)    Notable for increased bilirubin,. AKI, leukocytosis  EKG   EKG Interpretation  Date/Time:  Saturday November 07 2022 12:34:38 EDT Ventricular Rate:  97 PR Interval:  176 QRS Duration: 115 QT Interval:  382 QTC Calculation: 486 R Axis:   211 Text Interpretation: Sinus rhythm Abnormal lateral and inferior Q waves No significant change since last tracing Baseline wander in lead(s) I II aVR Confirmed by Garnette Gunner [phone]) on 11/07/2022 1:42:01 PM         Imaging Studies ordered: I ordered imaging studies including CT Abd pelvic On my interpretation imaging demonstrates increased biliary dilation  I independently visualized and interpreted imaging. I agree with the radiologist interpretation   Medicines ordered and  prescription drug management: Meds ordered this encounter  Medications   lactated ringers infusion   ceFEPIme (MAXIPIME) 2 g in sodium chloride 0.9 % 100 mL IVPB    Order Specific Question:   Antibiotic Indication:    Answer:   Other Indication (list below)    Order Specific Question:   Other Indication:    Answer:   Intra-abdominal   metroNIDAZOLE (FLAGYL) IVPB 500 mg    Order Specific Question:   Antibiotic Indication:    Answer:   Intra-abdominal Infection   DISCONTD: vancomycin (VANCOCIN) IVPB 1000 mg/200 mL premix    Order Specific Question:   Indication:    Answer:   Other Indication (list below)    Order Specific Question:   Other Indication:    Answer:   Intra-abdominal  DISCONTD: lactated ringers bolus 2,226 mL   lactated ringers bolus 1,000 mL   vancomycin (VANCOREADY) IVPB 1500 mg/300 mL    Order Specific Question:   Indication:    Answer:   Other Indication (list below)    Order Specific Question:   Other Indication:    Answer:   Intra-abdominal   lidocaine (XYLOCAINE) 2 % jelly 1 Application   vancomycin (VANCOREADY) IVPB 750 mg/150 mL    Order Specific Question:   Indication:    Answer:   Other Indication (list below)    Order Specific Question:   Other Indication:    Answer:   intra-abdominal    -I have reviewed the patients home medicines and have made adjustments as needed   Consultations Obtained: I requested consultation with the GI Dr. Jenetta Downer,  and discussed lab and imaging findings as well as pertinent plan - they recommend: needs higher level of care for possible ERCP   Cardiac Monitoring: The patient was maintained on a cardiac monitor.  I personally viewed and interpreted the cardiac monitored which showed an underlying rhythm of: sinus tachycardia  Social Determinants of Health:  Diagnosis or treatment significantly limited by social determinants of health: current smoker   Reevaluation: After the interventions noted above, I reevaluated the  patient and found that their symptoms have improved  Co morbidities that complicate the patient evaluation  Past Medical History:  Diagnosis Date   Cancer ([place])    CHF (congestive heart failure) (HCC)    Chronic pain    neck, back, knees   Class 1 obesity due to excess calories with body mass index (BMI) of 31.0 to 31.9 in adult    Claudication [facility])    Coronary artery disease    DDD (degenerative disc disease), cervical    Diabetes mellitus ([place])    Dyslipidemia    Hypertension    Port-A-Cath in place 07/20/2021   Tobacco dependence       Dispostion: Disposition decision including need for hospitalization was considered, and patient disposition pending at time of sign out.    Final Clinical Impression(s) / ED Diagnoses Final diagnoses:  Severe sepsis ([place])  Cholangitis     This chart was dictated using voice recognition software.  Despite best efforts to proofread,  errors can occur which can change the documentation meaning.    Cristie Hem, MD 11/07/22 [phone]

## 2022-11-07 NOTE — ED Notes (Signed)
[facility] called for transport. 

## 2022-11-07 NOTE — ED Triage Notes (Signed)
Pt c/o fever, weakness and dizziness for the last 3 days  Pt is jaundice in color and was seen last week at [facility] to put in a urinary stent and catheter due to stones

## 2022-11-08 ENCOUNTER — Encounter (HOSPITAL_COMMUNITY): Payer: Self-pay | Admitting: Internal Medicine

## 2022-11-08 ENCOUNTER — Inpatient Hospital Stay (HOSPITAL_COMMUNITY): Payer: Medicare Other

## 2022-11-08 DIAGNOSIS — K8309 Other cholangitis: Secondary | ICD-10-CM

## 2022-11-08 DIAGNOSIS — I255 Ischemic cardiomyopathy: Secondary | ICD-10-CM

## 2022-11-08 DIAGNOSIS — R7881 Bacteremia: Secondary | ICD-10-CM

## 2022-11-08 DIAGNOSIS — K831 Obstruction of bile duct: Secondary | ICD-10-CM | POA: Diagnosis not present

## 2022-11-08 DIAGNOSIS — C221 Intrahepatic bile duct carcinoma: Secondary | ICD-10-CM | POA: Diagnosis not present

## 2022-11-08 DIAGNOSIS — N179 Acute kidney failure, unspecified: Secondary | ICD-10-CM | POA: Diagnosis not present

## 2022-11-08 DIAGNOSIS — A4181 Sepsis due to Enterococcus: Secondary | ICD-10-CM

## 2022-11-08 DIAGNOSIS — C7889 Secondary malignant neoplasm of other digestive organs: Secondary | ICD-10-CM | POA: Diagnosis not present

## 2022-11-08 LAB — COMPREHENSIVE METABOLIC PANEL
ALT: 57 U/L — ABNORMAL HIGH (ref 0–44)
AST: 107 U/L — ABNORMAL HIGH (ref 15–41)
Albumin: 1.5 g/dL — ABNORMAL LOW (ref 3.5–5.0)
Alkaline Phosphatase: 278 U/L — ABNORMAL HIGH (ref 38–126)
Anion gap: 11 (ref 5–15)
BUN: 36 mg/dL — ABNORMAL HIGH (ref 8–23)
CO2: 19 mmol/L — ABNORMAL LOW (ref 22–32)
Calcium: 7.7 mg/dL — ABNORMAL LOW (ref 8.9–10.3)
Chloride: 97 mmol/L — ABNORMAL LOW (ref 98–111)
Creatinine, Ser: 2.44 mg/dL — ABNORMAL HIGH (ref 0.61–1.24)
GFR, Estimated: 28 mL/min — ABNORMAL LOW (ref >60)
Glucose, Bld: 98 mg/dL (ref 70–99)
Potassium: 3.8 mmol/L (ref 3.5–5.1)
Sodium: 127 mmol/L — ABNORMAL LOW (ref 135–145)
Total Bilirubin: 3.3 mg/dL — ABNORMAL HIGH (ref 0.3–1.2)
Total Protein: 4.8 g/dL — ABNORMAL LOW (ref 6.5–8.1)

## 2022-11-08 LAB — BODY FLUID CELL COUNT WITH DIFFERENTIAL
Eos, Fluid: 0 %
Lymphs, Fluid: 45 %
Monocyte-Macrophage-Serous Fluid: 15 % — ABNORMAL LOW (ref 50–90)
Neutrophil Count, Fluid: 40 % — ABNORMAL HIGH (ref 0–25)
Total Nucleated Cell Count, Fluid: 438 cu mm (ref 0–1000)

## 2022-11-08 LAB — BLOOD CULTURE ID PANEL (REFLEXED) - BCID2

## 2022-11-08 LAB — CBC
HCT: 22.1 % — ABNORMAL LOW (ref 39.0–52.0)
Hemoglobin: 7.9 g/dL — ABNORMAL LOW (ref 13.0–17.0)
MCH: 33.8 pg (ref 26.0–34.0)
MCHC: 35.7 g/dL (ref 30.0–36.0)
MCV: 94.4 fL (ref 80.0–100.0)
Platelets: 79 10*3/uL — ABNORMAL LOW (ref 150–400)
RBC: 2.34 MIL/uL — ABNORMAL LOW (ref 4.22–5.81)
RDW: 16.3 % — ABNORMAL HIGH (ref 11.5–15.5)
WBC: 12.6 10*3/uL — ABNORMAL HIGH (ref 4.0–10.5)
nRBC: 0 % (ref 0.0–0.2)

## 2022-11-08 LAB — GRAM STAIN

## 2022-11-08 LAB — OSMOLALITY: Osmolality: 280 mOsm/kg (ref 275–295)

## 2022-11-08 LAB — MAGNESIUM: Magnesium: 1.5 mg/dL — ABNORMAL LOW (ref 1.7–2.4)

## 2022-11-08 LAB — LACTATE DEHYDROGENASE, PLEURAL OR PERITONEAL FLUID: LD, Fluid: 46 U/L — ABNORMAL HIGH (ref 3–23)

## 2022-11-08 LAB — PHOSPHORUS: Phosphorus: 4 mg/dL (ref 2.5–4.6)

## 2022-11-08 LAB — ALBUMIN, PLEURAL OR PERITONEAL FLUID: Albumin, Fluid: <1.5 g/dL

## 2022-11-08 LAB — PROTEIN, PLEURAL OR PERITONEAL FLUID: Total protein, fluid: <3 g/dL

## 2022-11-08 MED ORDER — LIDOCAINE HCL (PF) 1 % IJ SOLN
5.0000 mL | Freq: Once | INTRAMUSCULAR | Status: AC
  Administered 2022-11-08: 5 mL via INTRADERMAL

## 2022-11-08 MED ORDER — MAGNESIUM SULFATE 2 GM/50ML IV SOLN
2.0000 g | Freq: Once | INTRAVENOUS | Status: AC
  Administered 2022-11-08: 2 g via INTRAVENOUS
  Filled 2022-11-08: qty 50

## 2022-11-08 MED ORDER — ENSURE ENLIVE PO LIQD
237.0000 mL | Freq: Three times a day (TID) | ORAL | Status: DC
  Administered 2022-11-09: 237 mL via ORAL

## 2022-11-08 MED ORDER — SODIUM CHLORIDE 0.9 % IV SOLN
3.0000 g | Freq: Three times a day (TID) | INTRAVENOUS | Status: DC
  Administered 2022-11-08 – 2022-11-11 (×9): 3 g via INTRAVENOUS
  Filled 2022-11-08 (×9): qty 8

## 2022-11-08 MED ORDER — ADULT MULTIVITAMIN W/MINERALS CH
1.0000 | ORAL_TABLET | Freq: Every day | ORAL | Status: DC
  Administered 2022-11-08 – 2022-11-16 (×8): 1 via ORAL
  Filled 2022-11-08 (×9): qty 1

## 2022-11-08 NOTE — H&P (View-Only) (Signed)
Consultation  Referring Provider:  [facility]/ [place] Primary Care Physician:  Redmond School, MD Primary Gastroenterologist:  Dr.Rehman/ [place]our[place]  Reason for Consultation: Unresectable cholangiocarcinoma biliary obstruction, recent stent placement, presenting with fever and weakness x 3 days  HPI: Jonathan Keith is a 67 y.o. male known to Dr. Rush Landmark with history of unresectable cholangiocarcinoma.  He also has history of hypertension, diabetes mellitus. He had undergone biliary stenting and a portal vein embolization at the [facility] in [place] in November [id number], course complicated by Pseudomonas/coccus bacteremia.  He had exchange of biliary stents at [facility] in October 2022.  In follow-up at the [facility] in November 2023 and was told unfortunately he could not undergo a hepatectomy as his cholangiocarcinoma had progressed.  They changed the biliary stent to a metal stent at that time.  Dr. Stefani Dama Roddy was consulted earlier this month after his oncologist noted increase in LFTs and total bili.  RI/MRCP at that time showed moderate intrahepatic biliary ductal dilation common bile duct stent, although poorly visualized by MRI unchanged in the central common bile duct, there was a small volume of ascites and a single large gallstone contracted in the gallbladder, severe aortic atherosclerosis. Patient underwent repeat ERCP per Dr. Stefani Dama Roddy on 10/23/2022 with finding of a single severe biliary stricture in the left main hepatic duct, this was malignant appearing, stricture was dilated biliary tree was swept and a plastic biliary stent was placed through the pre-existing metal stent into the left hepatic duct to traverse the stricture.  Plan was to exchange the stent in 3 to 5 months if he did well to a metal stent, if he did not have improvement then likely would need IR intervention.  Patient presented to the emergency room at [facility] yesterday with 3-day history of progressive  generalized weakness and intermittent fevers.  He was also complaining of generalized abdominal pain worse in the right upper abdomen, he had had some nausea and vomiting.  According to notes Tmax was 101.  CT of the abdomen and pelvis was done noting interval increase volume of ascites with mild nodularity throughout the omentum and mesenteric fat suspicion for carcinomatosis, interval placement of plastic biliary stent well-positioned however there was increase in intrahepatic biliary dilation and no pneumobilia raising concern for stent dysfunction Evidence of prior embolization for intrahepatic cholangiocarcinoma Able 4 mm right middle lobe pulmonary nodule stable calcified bladder calculus and interval increase in diffuse subcutaneous edema asymmetric to the right.  Patient has been started on Maxipime, Rocephin and vancomycin  Blood cultures already returning positive for Enterococcus.  Labs on admission WBC of 24.3/hemoglobin 9.3 T. bili 4.0/alk phos 365/AST 92/ALT 59 Lactate 3.3 Pro time 16.2/INR 1.3  Today-WBC 12.6 improved/hemoglobin 7.9/hematocrit 22.1/platelets 79 Sodium 127/potassium 3.8/BUN 36/creatinine 2.44 T. bili 3.3/AST 107/ALT 57/alk phos 278  Temp 100.2 at 4 AM Blood pressure 95/50 pulse 110   Patient says he definitely feels better than he did yesterday in general still having abdominal discomfort but no severe pain no nausea.  No shaking chills.  Patient had undergone paracentesis on 311 2024-3.5 L removed     Past Medical History:  Diagnosis Date   Cancer ([place])    CHF (congestive heart failure) (HCC)    Chronic pain    neck, back, knees   Class 1 obesity due to excess calories with body mass index (BMI) of 31.0 to 31.9 in adult    Claudication [facility])    Coronary artery disease  DDD (degenerative disc disease), cervical    Diabetes mellitus ([place])    Dyslipidemia    Hypertension    Port-A-Cath in place 07/20/2021   Tobacco dependence     Past  Surgical History:  Procedure Laterality Date   BILIARY BRUSHING N/A 02/25/2021   Procedure: BILIARY BRUSHING;  Surgeon: Rogene Houston, MD;  Location: [facility];  Service: Gastroenterology;  Laterality: N/A;   BILIARY DILATION  10/23/2022   Procedure: BILIARY DILATION;  Surgeon: Rush Landmark Telford Nab., MD;  Location: [place];  Service: Gastroenterology;;   BILIARY STENT PLACEMENT N/A 02/25/2021   Procedure: BILIARY STENT PLACEMENT;  Surgeon: Rogene Houston, MD;  Location: [facility];  Service: Gastroenterology;  Laterality: N/A;   BILIARY STENT PLACEMENT  02/27/2021   Procedure: BILIARY STENT PLACEMENT (10FR x 9cm) IN THE RIGHT SYSTEM;  Surgeon: Rogene Houston, MD;  Location: [facility];  Service: Gastroenterology;;   BILIARY STENT PLACEMENT  10/23/2022   Procedure: BILIARY STENT PLACEMENT;  Surgeon: Irving Copas., MD;  Location: [place];  Service: Gastroenterology;;   BREAST SURGERY Left    benign lump- in his 8s.   CARDIAC CATHETERIZATION     ERCP N/A 02/25/2021   Procedure: ENDOSCOPIC RETROGRADE CHOLANGIOPANCREATOGRAPHY (ERCP);  Surgeon: Rogene Houston, MD;  Location: [facility];  Service: Gastroenterology;  Laterality: N/A;   ERCP N/A 02/27/2021   Procedure: ENDOSCOPIC RETROGRADE CHOLANGIOPANCREATOGRAPHY (ERCP);  Surgeon: Rogene Houston, MD;  Location: [facility];  Service: Gastroenterology;  Laterality: N/A;   ERCP N/A 10/23/2022   Procedure: ENDOSCOPIC RETROGRADE CHOLANGIOPANCREATOGRAPHY (ERCP);  Surgeon: Irving Copas., MD;  Location: [place];  Service: Gastroenterology;  Laterality: N/A;   IR IMAGING GUIDED PORT INSERTION  07/21/2021   IR REMOVAL TUN ACCESS W/ PORT W/O FL MOD SED  09/30/2022   KNEE SURGERY Left    x2   LEFT HEART CATH AND CORONARY ANGIOGRAPHY N/A 09/08/2021   Procedure: LEFT HEART CATH AND CORONARY ANGIOGRAPHY;  Surgeon: Early Osmond, MD;  Location: [place] [facility];  Service: Cardiovascular;  Laterality: N/A;   REMOVAL OF STONES   10/23/2022   Procedure: REMOVAL OF STONES;  Surgeon: Rush Landmark Telford Nab., MD;  Location: [place];  Service: Gastroenterology;;   SPHINCTEROTOMY N/A 02/25/2021   Procedure: Joan Mayans;  Surgeon: Rogene Houston, MD;  [facility];  Service: Gastroenterology;  Laterality: N/A;   STENT REMOVAL  02/27/2021   Procedure: STENT REMOVAL (8.5Fr x 9cm);  Surgeon: Rogene Houston, MD;  Location: [facility];  Service: Gastroenterology;;    Prior to Admission medications   Medication Sig Start Date End Date Taking? Authorizing Provider  acetaminophen (TYLENOL) 500 MG tablet Take 500 mg by mouth every 6 (six) hours as needed for moderate pain or headache.   Yes [provider]  albuterol (VENTOLIN HFA) 108 (90 Base) MCG/ACT inhaler Inhale 2 puffs into the lungs every 6 (six) hours as needed for wheezing or shortness of breath. 11/29/21  Yes Tat, Shanon Brow, MD  alfuzosin (UROXATRAL) 10 MG 24 hr tablet Take 1 tablet (10 mg total) by mouth at bedtime. 08/21/22  Yes McKenzie, Candee Furbish, MD  atorvastatin (LIPITOR) 40 MG tablet Take 1 tablet (40 mg total) by mouth daily at 6 PM. 12/05/21  Yes O'Neal, Cassie Freer, MD  carvedilol (COREG) 3.125 MG tablet Take 1 tablet (3.125 mg total) by mouth 2 (two) times daily with a meal. 10/25/22 11/24/22 Yes Pahwani, Einar Grad, MD  CISplatin (PLATINOL) 50 MG/50ML SOLN Inject 50 mg into the vein  once a week. On days 1 and 8, every 21 days   Yes [provider]  durvalumab 1,500 mg in sodium chloride 0.9 % 100 mL Inject 1,500 mg into the vein every 21 ( twenty-one) days.   Yes [provider]  famotidine (PEPCID) 20 MG tablet Take 20 mg by mouth 2 (two) times daily.   Yes [provider]  ferrous sulfate (FERROUSUL) 325 (65 FE) MG tablet Take 1 tablet (325 mg total) by mouth daily with breakfast. 03/18/21  Yes Rehman, Mechele Dawley, MD  furosemide (LASIX) 20 MG tablet Take 20 mg by mouth as needed for fluid or edema. If he gains weight five pounds when  taking chemo, take one tab a day till he is back to normal weight or within 2 pounds for fluid check before surgery.   Yes [provider]  gemcitabine 760 mg/m2 in sodium chloride 0.9 % 100 mL Inject 760 mg/m2 into the vein once a week. On days 1 and 8 every 21 days   Yes [provider]  guaifenesin (ROBITUSSIN) 100 MG/5ML syrup Take 200 mg by mouth 3 (three) times daily as needed for cough.   Yes [provider]  lisinopril (ZESTRIL) 2.5 MG tablet Take 1 tablet (2.5 mg total) by mouth daily. 09/11/22 09/06/23 Yes Mallipeddi, Vishnu P, MD  metFORMIN (GLUCOPHAGE) 500 MG tablet Take 500 mg by mouth 2 (two) times daily with a meal.   Yes [provider]  Misc. Devices KIT 1 Box by Does not apply route every 7 (seven) days. 10/20/22  Yes Derek Jack, MD  nitroGLYCERIN (NITROSTAT) 0.4 MG SL tablet Place 1 tablet (0.4 mg total) under the tongue every 5 (five) minutes as needed for chest pain. 09/12/21 11/07/22 Yes Duke, Tami Lin, PA  pantoprazole (PROTONIX) 40 MG tablet Take 1 tablet (40 mg total) by mouth daily. 12/05/21  Yes O'Neal, Cassie Freer, MD  prochlorperazine (COMPAZINE) 10 MG tablet TAKE (1) TABLET BY MOUTH EVERY (6) HOURS AS NEEDED. Patient taking differently: Take 10 mg by mouth every 6 (six) hours as needed for nausea or vomiting. TAKE (1) TABLET BY MOUTH EVERY (6) HOURS AS NEEDED. 09/28/22  Yes Derek Jack, MD  sodium chloride flush (NS) 0.9 % SOLN 10 mLs by Intracatheter route every 7 (seven) days. Patient taking differently: 10 mLs by Intracatheter route daily. 10/20/22  Yes Derek Jack, MD  Wound Dressings (INTRASITE GEL APPLIPAK) GEL Apply 1 Units topically daily. 10/05/22   Derek Jack, MD    Current Facility-Administered Medications  Medication Dose Route Frequency Provider Last Rate Last Admin   albumin human 25 % solution 25 g  25 g Intravenous Once Bernadette Hoit, DO   Held at 11/07/22 1958   cefTRIAXone  (ROCEPHIN) 2 g in sodium chloride 0.9 % 100 mL IVPB  2 g Intravenous Q24H Adefeso, Oladapo, DO       Chlorhexidine Gluconate Cloth 2 % PADS 6 each  6 each Topical Daily Adefeso, Oladapo, DO   6 each at 11/07/22 2245   [START ON 11/09/2022] feeding supplement (ENSURE ENLIVE / ENSURE PLUS) liquid 237 mL  237 mL Oral TID BM Mariel Aloe, MD       lactated ringers bolus 1,000 mL  1,000 mL Intravenous Once Wynona Dove A, DO       multivitamin with minerals tablet 1 tablet  1 tablet Oral Daily Mariel Aloe, MD   1 tablet at 11/08/22 0954   ondansetron (ZOFRAN) tablet 4 mg  4  mg Oral Q6H PRN Bernadette Hoit, DO       Or   ondansetron (ZOFRAN) injection 4 mg  4 mg Intravenous Q6H PRN Adefeso, Oladapo, DO       pantoprazole (PROTONIX) injection 40 mg  40 mg Intravenous Q24H Adefeso, Oladapo, DO   40 mg at 11/07/22 2028   sodium chloride flush (NS) 0.9 % injection 10-40 mL  10-40 mL Intracatheter Q12H Adefeso, Oladapo, DO   10 mL at 11/08/22 0846   vancomycin (VANCOREADY) IVPB 750 mg/150 mL  750 mg Intravenous Q24H Cristie Hem, MD        Allergies as of 11/07/2022   (No Known Allergies)    Family History  Problem Relation Age of Onset   CAD Mother    Cancer Neg Hx     Social History   Socioeconomic History   Marital status: Married    Spouse name: Not on file   Number of children: Not on file   Years of education: Not on file   Highest education level: Not on file  Occupational History   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  Tobacco Use   Smoking status: Every Day    Packs/day: 0.50    Years: 56.00    Additional pack years: 0.00    Total pack years: 28.00    Types: Cigarettes   Smokeless tobacco: Never  Vaping Use   Vaping Use: Never used  Substance and Sexual Activity   Alcohol use: Not Currently    Comment: stopped 2.5 years ago 03/11/21   Drug use: Never   Sexual activity: Not on file  Other Topics Concern   Not on file  Social History Narrative   Not on file    Social Determinants of Health   Financial Resource Strain: Medium Risk (03/07/2021)   Overall Financial Resource Strain (CARDIA)    Difficulty of Paying Living Expenses: Somewhat hard  Food Insecurity: No Food Insecurity (10/22/2022)   Hunger Vital Sign    Worried About Running Out of Food in the Last Year: Never true    Ran Out of Food in the Last Year: Never true  Transportation Needs: No Transportation Needs (10/22/2022)   PRAPARE - Hydrologist (Medical): No    Lack of Transportation (Non-Medical): No  Physical Activity: Insufficiently Active (03/07/2021)   Exercise Vital Sign    Days of Exercise per Week: 2 days    Minutes of Exercise per Session: 20 min  Stress: No Stress Concern Present (03/07/2021)   [place]    Feeling of Stress : Only a little  Social Connections: Moderately Integrated (03/07/2021)   Social Connection and Isolation Panel [NHANES]    Frequency of Communication with Friends and Family: More than three times a week    Frequency of Social Gatherings with Friends and Family: More than three times a week    Attends Religious Services: More than 4 times per year    Active Member of [employer] or Organizations: No    Attends Archivist Meetings: Never    Marital Status: Married  Human resources officer Violence: Not At Risk (10/22/2022)   Humiliation, Afraid, Rape, and Kick questionnaire    Fear of Current or Ex-Partner: No    Emotionally Abused: No    Physically Abused: No    Sexually Abused: No    Review of Systems: Pertinent positive and negative review of systems were noted in the above HPI section.  All other review of systems was otherwise negative.   Physical Exam: Vital signs in last 24 hours: Temp:  [98.4 F (36.9 C)-100.2 F (37.9 C)] 98.9 F (37.2 C) (03/31 0803) Pulse Rate:  [97-122] 100 (03/31 0803) Resp:  [17-20] 17 (03/31 0803) BP:  (86-133)/(52-76) 91/55 (03/31 0803) SpO2:  [87 %-98 %] 92 % (03/31 0803) Weight:  [75.8 kg] 75.8 kg (03/30 1203) Last BM Date : 11/06/22 General:   Alert,  Well-developed, well-nourished, chronically ill-appearing white male pleasant and cooperative in NAD  -blood pressure 91/50/pulse 100 Head:  Normocephalic and atraumatic. Eyes:  Sclera early icterus, no icterus.   Conjunctiva pink. Ears:  Normal auditory acuity. Nose:  No deformity, discharge,  or lesions. Mouth:  No deformity or lesions.   Neck:  Supple; no masses or thyromegaly. Lungs:  Clear throughout to auscultation.   No wheezes, crackles, or rhonchi.  Heart:  Regular rate and rhythm; no murmurs, clicks, rubs,  or gallops. Abdomen:  Soft, he is tender in the upper abdomen, no rebound no palpable mass, BS active, abdomen  full feeling diffusely-no diffuse tenderness Rectal: Not done Msk:  Symmetrical without gross deformities. . Pulses:  Normal pulses noted. Extremities:  Without clubbing or edema. Neurologic:  Alert and  oriented x4;  grossly normal neurologically. Skin:  Intact without significant lesions or rashes.. Psych:  Alert and cooperative. Normal mood and affect.  Intake/Output from previous day: 03/30 0701 - 03/31 0700 In: 296.8 [IV Piggyback:296.8] Out: 700 [Urine:700] Intake/Output this shift: Total I/O In: 120 [P.O.:120] Out: -   Lab Results: Recent Labs    11/07/22 1213 11/08/22 0357  WBC 24.3* 12.6*  HGB 9.3*  9.2* 7.9*  HCT 27.2*  27.0* 22.1*  PLT 92* 79*   BMET Recent Labs    11/07/22 1213 11/08/22 0357  NA 124*  128* 127*  K 4.5  4.6 3.8  CL 96*  96* 97*  CO2 20* 19*  GLUCOSE 161*  155* 98  BUN 37*  32* 36*  CREATININE 2.51*  2.90* 2.44*  CALCIUM 7.9* 7.7*   LFT Recent Labs    11/08/22 0357  PROT 4.8*  ALBUMIN 1.5*  AST 107*  ALT 57*  ALKPHOS 278*  BILITOT 3.3*   PT/INR Recent Labs    11/07/22 1213  LABPROT 16.2*  INR 1.3*   Hepatitis Panel No results for  input(s): "HEPBSAG", "HCVAB", "HEPAIGM", "HEPBIGM" in the last 72 hours.    IMPRESSION:  #32 67 year old white male with unresectable cholangiocarcinoma, status post prior coil embolization November 2022 and initial biliary stenting done at that time/[facility]. He has had exchange of biliary stents in October 2022, and then again in November 2023 at which time a metal stent was placed at the [facility].  He developed recurrent biliary obstruction earlier this month, and underwent ERCP, with finding of a severe biliary stricture in the left main hepatic duct the stricture was dilated, then with placement of a plastic biliary stent inside the existing metal stent, into the left hepatic duct to traverse the stricture It was noted that patient probably just has the left hepatic system at this time unable to visualize the right hepatic system  Patient represented yesterday with 3-day history of progressive weakness and intermittent fevers to 101.  Diagnosed with sepsis/bacteremia, elevated lactate, WBC of 24.3 febrile.  Imaging shows what appears to be a nonfunctioning/dysfunctional plastic biliary stent in good position within the metal stent but without any evidence of pneumobilia.  Also noted to  have ascites and increased nodularity in the mesentery concerning for pneumatosis.  Patient currently on vancomycin Maxipime and Rocephin he is clinically improved, white count down to 12.6, LFTs relatively stable  Blood cultures positive for Enterococcus    PLAN: Patient did eat some breakfast/fruit this morning He will need ERCP, with removal of the plastic biliary stent, and cleanout of the bile duct.  Dr. Stefani Dama Roddy does not feel that placing another plastic stent will benefit him much as he did not have any good benefit from this last stent.  Will need IR involvement, for possible additional percutaneous biliary drainage  Continue broad-spectrum antibiotics with Enterococcus coverage-has  been changed to Unasyn  Patient is also scheduled for paracentesis today will need cell counts, cytology culture  Will keep him n.p.o. in a.m. tomorrow, and plan to get him scheduled for ERCP with stent removal with Dr. Jethro Bastos PM.  Plans will be discussed in detail with the patient and his wife.   Zuleica Seith PA-C  11/08/2022, 10:04 AM

## 2022-11-08 NOTE — Progress Notes (Signed)
Initial Nutrition Assessment  DOCUMENTATION CODES:   Not applicable  INTERVENTION:   -Once diet is advanced, add:   -Ensure Enlive po TID, each supplement provides 350 kcal and 20 grams of protein -MVI with minerals daily  NUTRITION DIAGNOSIS:   Increased nutrient needs related to cancer and cancer related treatments as evidenced by estimated needs.  GOAL:   Patient will meet greater than or equal to 90% of their needs  MONITOR:   PO intake, Supplement acceptance, Diet advancement  REASON FOR ASSESSMENT:   Consult Assessment of nutrition requirement/status  ASSESSMENT:   Pt with medical history significant of metastatic cholangiocarcinoma with biliary obstruction and recent stent placement, hyperlipidemia, CAD with ischemic cardiomyopathy (LVEF 45 to 50%;  +RWMA - 09/14/2022), AKI on CKD 4, recent LV thrombosis s/p treatment with Coumadin, AOCD who presents due to 3-day onset of fever and generalized weakness.  Pt admitted with sepsis secondary to biliary obstruction in setting of metastatic cholangiocarcinoma suspected to be due to biliary stent dysfunction.   Reviewed I/O's: -403 ml x 24 hours  UOP: 700 ml x 24 hours   Pt unavailable at time of visit. Attempted to speak with pt via call to hospital room phone, however, unable to reach. RD unable to obtain further nutrition-related history or complete nutrition-focused physical exam at this time.    Pt is currently NPO; he is awaiting GI consult. IR has also been consulted for potential need for paracentesis.   Pt has been followed by [place] RD, however, has not been seen since 04/2022. Pt is a selective eater at baseline and likes to snack of fruit between meals. Pt has complained of altered taste during nutrition visits. He drinks [employer], however, has not done so consistently. Per RN notes, pt with nausea to food odors.   Reviewed wt hx; wt has been stable over the past 3 months.   Pt at  high risk for malnutrition, however, unable to identify at this time. Pt would greatly benefit from addition of oral nutrition supplements once diet is advanced.   Medications reviewed and include magnesium sulfate.   Lab Results  Component Value Date   HGBA1C 5.8 (H) 09/08/2021   PTA DM medications are 500 mg metformin BID.   Labs reviewed: Na: 127, Mg: 1.5 (on IV supplementation). K and Phos WDL. CBGS: 93 (inpatient orders for glycemic control are none).    Diet Order:   Diet Order             Diet NPO time specified  Diet effective now                   EDUCATION NEEDS:   No education needs have been identified at this time  Skin:  Skin Assessment: Skin Integrity Issues: Skin Integrity Issues:: Other (Comment), Incisions Incisions: rt chest Other: skin tear to lt arm, skin tear to rt upper arm  Last BM:  11/06/22  Height:   Ht Readings from Last 1 Encounters:  11/07/22 5\' 11"  (1.803 m)    Weight:   Wt Readings from Last 1 Encounters:  11/07/22 75.8 kg    Ideal Body Weight:  78.2 kg  BMI:  Body mass index is 23.29 kg/m.  Estimated Nutritional Needs:   Kcal:  [id number]  Protein:  115-130 grams  Fluid:  > 2 L    Loistine Chance, RD, LDN, Portal Registered Dietitian II Certified Diabetes Care and Education Specialist Please refer to [facility] for RD and/or RD  on-call/weekend/after hours pager

## 2022-11-08 NOTE — Consult Note (Addendum)
Chief Complaint: Biliary obstruction  Referring Physician(s): Amy New Castle  Supervising Physician: Aletta Edouard  Patient Status: Jonathan Keith - In-pt  History of Present Illness: Jonathan Keith is a 67 y.o. male  unresectable cholangiocarcinoma followed by dr Rush Landmark.   He is known to our service. He is seen by me today during his paracentesis.  In Nov 2023, biliary stent changed to metal stent.   Earlier this month he was found to have worsening transaminitis and hyperbilirubinemia.   RI/MCRP showed moderate intrahepatic biliary ductal dilatation.   He underwent ERCP on 3/15 where plastic biliary stent placed for severe stricture in the left main hepatic duct for plan to exchange in 3-5 months.   He presented to [facility] with 3 days of fevers and weakness associated with worsening abdominal pain.   He had fever of 101. WBC 24K.  CT showed worsening intrahepatic biliary dilation despite recent stent and increased ascites.   We are asked to evaluate for image guided placement of a percutaneous biliary drain.  Past Medical History:  Diagnosis Date   Cancer ([place])    CHF (congestive heart failure) (HCC)    Chronic pain    neck, back, knees   Class 1 obesity due to excess calories with body mass index (BMI) of 31.0 to 31.9 in adult    Claudication [facility])    Coronary artery disease    DDD (degenerative disc disease), cervical    Diabetes mellitus ([place])    Dyslipidemia    Hypertension    Port-A-Cath in place 07/20/2021   Tobacco dependence     Past Surgical History:  Procedure Laterality Date   BILIARY BRUSHING N/A 02/25/2021   Procedure: BILIARY BRUSHING;  Surgeon: Rogene Houston, MD;  Location: [facility];  Service: Gastroenterology;  Laterality: N/A;   BILIARY DILATION  10/23/2022   Procedure: BILIARY DILATION;  Surgeon: Rush Landmark Telford Nab., MD;  Location: [place];  Service: Gastroenterology;;   BILIARY STENT PLACEMENT N/A 02/25/2021   Procedure: BILIARY  STENT PLACEMENT;  Surgeon: Rogene Houston, MD;  Location: [facility];  Service: Gastroenterology;  Laterality: N/A;   BILIARY STENT PLACEMENT  02/27/2021   Procedure: BILIARY STENT PLACEMENT (10FR x 9cm) IN THE RIGHT SYSTEM;  Surgeon: Rogene Houston, MD;  Location: [facility];  Service: Gastroenterology;;   BILIARY STENT PLACEMENT  10/23/2022   Procedure: BILIARY STENT PLACEMENT;  Surgeon: Irving Copas., MD;  Location: [employer];  Service: Gastroenterology;;   BREAST SURGERY Left    benign lump- in his 80s.   CARDIAC CATHETERIZATION     ERCP N/A 02/25/2021   Procedure: ENDOSCOPIC RETROGRADE CHOLANGIOPANCREATOGRAPHY (ERCP);  Surgeon: Rogene Houston, MD;  Location: [facility];  Service: Gastroenterology;  Laterality: N/A;   ERCP N/A 02/27/2021   Procedure: ENDOSCOPIC RETROGRADE CHOLANGIOPANCREATOGRAPHY (ERCP);  Surgeon: Rogene Houston, MD;  Location: [facility];  Service: Gastroenterology;  Laterality: N/A;   ERCP N/A 10/23/2022   Procedure: ENDOSCOPIC RETROGRADE CHOLANGIOPANCREATOGRAPHY (ERCP);  Surgeon: Irving Copas., MD;  Location: [place];  Service: Gastroenterology;  Laterality: N/A;   IR IMAGING GUIDED PORT INSERTION  07/21/2021   IR REMOVAL TUN ACCESS W/ PORT W/O FL MOD SED  09/30/2022   KNEE SURGERY Left    x2   LEFT HEART CATH AND CORONARY ANGIOGRAPHY N/A 09/08/2021   Procedure: LEFT HEART CATH AND CORONARY ANGIOGRAPHY;  Surgeon: Early Osmond, MD;  Location: [facility];  Service: Cardiovascular;  Laterality: N/A;   REMOVAL OF STONES  10/23/2022   Procedure: REMOVAL OF STONES;  Surgeon: Rush Landmark Telford Nab., MD;  Location: [place];  Service: Gastroenterology;;   SPHINCTEROTOMY N/A 02/25/2021   Procedure: Joan Mayans;  Surgeon: Rogene Houston, MD;  Location: [facility];  Service: Gastroenterology;  Laterality: N/A;   STENT REMOVAL  02/27/2021   Procedure: STENT REMOVAL (8.5Fr x 9cm);  Surgeon: Rogene Houston, MD;  Location[facility];  Service:  Gastroenterology;;    Allergies: Patient has no known allergies.  Medications: Prior to Admission medications   Medication Sig Start Date End Date Taking? Authorizing Provider  acetaminophen (TYLENOL) 500 MG tablet Take 500 mg by mouth every 6 (six) hours as needed for moderate pain or headache.   Yes [provider]  albuterol (VENTOLIN HFA) 108 (90 Base) MCG/ACT inhaler Inhale 2 puffs into the lungs every 6 (six) hours as needed for wheezing or shortness of breath. 11/29/21  Yes Tat, Shanon Brow, MD  alfuzosin (UROXATRAL) 10 MG 24 hr tablet Take 1 tablet (10 mg total) by mouth at bedtime. 08/21/22  Yes McKenzie, Candee Furbish, MD  atorvastatin (LIPITOR) 40 MG tablet Take 1 tablet (40 mg total) by mouth daily at 6 PM. 12/05/21  Yes O'Neal, Cassie Freer, MD  carvedilol (COREG) 3.125 MG tablet Take 1 tablet (3.125 mg total) by mouth 2 (two) times daily with a meal. 10/25/22 11/24/22 Yes Pahwani, Einar Grad, MD  CISplatin (PLATINOL) 50 MG/50ML SOLN Inject 50 mg into the vein once a week. On days 1 and 8, every 21 days   Yes [provider]  durvalumab 1,500 mg in sodium chloride 0.9 % 100 mL Inject 1,500 mg into the vein every 21 ( twenty-one) days.   Yes [provider]  famotidine (PEPCID) 20 MG tablet Take 20 mg by mouth 2 (two) times daily.   Yes [provider]  ferrous sulfate (FERROUSUL) 325 (65 FE) MG tablet Take 1 tablet (325 mg total) by mouth daily with breakfast. 03/18/21  Yes Rehman, Mechele Dawley, MD  furosemide (LASIX) 20 MG tablet Take 20 mg by mouth as needed for fluid or edema. If he gains weight five pounds when taking chemo, take one tab a day till he is back to normal weight or within 2 pounds for fluid check before surgery.   Yes [provider]  gemcitabine 760 mg/m2 in sodium chloride 0.9 % 100 mL Inject 760 mg/m2 into the vein once a week. On days 1 and 8 every 21 days   Yes [provider]  guaifenesin (ROBITUSSIN) 100 MG/5ML syrup Take 200 mg by  mouth 3 (three) times daily as needed for cough.   Yes [provider]  lisinopril (ZESTRIL) 2.5 MG tablet Take 1 tablet (2.5 mg total) by mouth daily. 09/11/22 09/06/23 Yes Mallipeddi, Vishnu P, MD  metFORMIN (GLUCOPHAGE) 500 MG tablet Take 500 mg by mouth 2 (two) times daily with a meal.   Yes [provider]  Misc. Devices KIT 1 Box by Does not apply route every 7 (seven) days. 10/20/22  Yes Derek Jack, MD  nitroGLYCERIN (NITROSTAT) 0.4 MG SL tablet Place 1 tablet (0.4 mg total) under the tongue every 5 (five) minutes as needed for chest pain. 09/12/21 11/07/22 Yes Duke, Tami Lin, PA  pantoprazole (PROTONIX) 40 MG tablet Take 1 tablet (40 mg total) by mouth daily. 12/05/21  Yes O'Neal, Cassie Freer, MD  prochlorperazine (COMPAZINE) 10 MG tablet TAKE (1) TABLET BY MOUTH EVERY (6) HOURS AS NEEDED. Patient taking differently: Take 10 mg by mouth  every 6 (six) hours as needed for nausea or vomiting. TAKE (1) TABLET BY MOUTH EVERY (6) HOURS AS NEEDED. 09/28/22  Yes Derek Jack, MD  sodium chloride flush (NS) 0.9 % SOLN 10 mLs by Intracatheter route every 7 (seven) days. Patient taking differently: 10 mLs by Intracatheter route daily. 10/20/22  Yes Derek Jack, MD  Wound Dressings (INTRASITE GEL APPLIPAK) GEL Apply 1 Units topically daily. 10/05/22   Derek Jack, MD     Family History  Problem Relation Age of Onset   CAD Mother    Cancer Neg Hx     Social History   Socioeconomic History   Marital status: Married    Spouse name: Not on file   Number of children: Not on file   Years of education: Not on file   Highest education level: Not on file  Occupational History   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  Tobacco Use   Smoking status: Every Day    Packs/day: 0.50    Years: 56.00    Additional pack years: 0.00    Total pack years: 28.00    Types: Cigarettes   Smokeless tobacco: Never  Vaping Use   Vaping Use: Never used  Substance  and Sexual Activity   Alcohol use: Not Currently    Comment: stopped 2.5 years ago 03/11/21   Drug use: Never   Sexual activity: Not on file  Other Topics Concern   Not on file  Social History Narrative   Not on file   Social Determinants of Health   Financial Resource Strain: Medium Risk (03/07/2021)   Overall Financial Resource Strain (CARDIA)    Difficulty of Paying Living Expenses: Somewhat hard  Food Insecurity: No Food Insecurity (10/22/2022)   Hunger Vital Sign    Worried About Running Out of Food in the Last Year: Never true    Ran Out of Food in the Last Year: Never true  Transportation Needs: No Transportation Needs (10/22/2022)   PRAPARE - Hydrologist (Medical): No    Lack of Transportation (Non-Medical): No  Physical Activity: Insufficiently Active (03/07/2021)   Exercise Vital Sign    Days of Exercise per Week: 2 days    Minutes of Exercise per Session: 20 min  Stress: No Stress Concern Present (03/07/2021)   [place]    Feeling of Stress : Only a little  Social Connections: Moderately Integrated (03/07/2021)   Social Connection and Isolation Panel [NHANES]    Frequency of Communication with Friends and Family: More than three times a week    Frequency of Social Gatherings with Friends and Family: More than three times a week    Attends Religious Services: More than 4 times per year    Active Member of [employer] or Organizations: No    Attends Archivist Meetings: Never    Marital Status: Married     Review of Systems: A 12 point ROS discussed and pertinent positives are indicated in the HPI above.  All other systems are negative.  Review of Systems  Vital Signs: BP (!) 90/56 (BP Location: Left Arm)   Pulse 88   Temp 98.2 F (36.8 C) (Oral)   Resp 14   Ht 5\' 11"  (1.803 m)   Wt 167 lb (75.8 kg)   SpO2 97%   BMI 23.29 kg/m   Physical Exam Constitutional:       Appearance: Normal appearance.  HENT:     Head:  Normocephalic and atraumatic.  Cardiovascular:     Rate and Rhythm: Normal rate and regular rhythm.  Pulmonary:     Effort: Pulmonary effort is normal. No respiratory distress.     Breath sounds: Normal breath sounds.  Abdominal:     General: There is distension.     Palpations: Abdomen is soft.  Skin:    General: Skin is warm and dry.  Neurological:     General: No focal deficit present.     Mental Status: He is alert and oriented to person, place, and time.  Psychiatric:        Mood and Affect: Mood normal.        Behavior: Behavior normal.        Thought Content: Thought content normal.        Judgment: Judgment normal.     Imaging: CT ABDOMEN PELVIS WO CONTRAST  Result Date: 11/07/2022 CLINICAL DATA:  Abdominal pain, acute, nonlocalized. Fever with weakness and dizziness for 3 days. Jaundice. History of intrahepatic cholangiocarcinoma. * Tracking Code: BO * EXAM: CT ABDOMEN AND PELVIS WITHOUT CONTRAST TECHNIQUE: Multidetector CT imaging of the abdomen and pelvis was performed following the standard protocol without IV contrast. RADIATION DOSE REDUCTION: This exam was performed according to the departmental dose-optimization program which includes automated exposure control, adjustment of the mA and/or kV according to patient size and/or use of iterative reconstruction technique. COMPARISON:  Abdominopelvic CT 08/28/2022. Abdominal MRI 10/19/2022. FINDINGS: Lower chest: 4 mm right middle lobe nodule on image 4/4 is unchanged from the prior chest CT. There is central airway thickening at both lung bases with mild dependent right lower lobe atelectasis. No consolidation or enlarging nodule identified. Aortic and coronary artery atherosclerosis noted. 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 Hepatobiliary: Previous embolization procedure for intrahepatic cholangiocarcinoma noted centrally in  the liver, with associated beam hardening artifact. No recurrent focal mass lesion identified. Previously demonstrated known large gallstone is not well visualized. The gallbladder appears unchanged with mild wall thickening. A metallic biliary stent is in place with interval placement of a plastic biliary stent extending superiorly into the hepatic hilum and inferiorly into the ampulla. There is no pneumobilia, and the intrahepatic biliary dilatation appears slightly increased. Pancreas: Unremarkable. No pancreatic ductal dilatation or surrounding inflammatory changes. Spleen: Normal in size without focal abnormality. Adrenals/Urinary Tract: Both adrenal glands appear normal. No evidence of renal or ureteral calculus, hydronephrosis or perinephric soft tissue stranding. Both kidneys appear unremarkable. A calcified bladder calculus is again noted. Foley catheter extends into the bladder lumen. The bladder is decompressed. Stomach/Bowel: No enteric contrast administered. The stomach appears unremarkable for its degree of distention. There is possible mild small bowel wall thickening in the mid abdomen, but no significant bowel distension. Mild diverticulosis of the distal colon. The colon and appendix otherwise appear unremarkable. Vascular/Lymphatic: There are no enlarged abdominal or pelvic lymph nodes. Diffuse aortic and branch vessel atherosclerosis with chronically displaced intimal calcifications within the infrarenal abdominal aorta. No focal aneurysm. Reproductive: The prostate gland and seminal vesicles appear unremarkable. Other: Interval increased volume of ascites with mild nodularity throughout the omental and mesenteric fat, suspicious for carcinomatosis. No free intraperitoneal air. Musculoskeletal: No acute or significant osseous findings. Interval increased diffuse subcutaneous edema, asymmetric to the right. Stable mild lumbar spondylosis. IMPRESSION: 1. Interval increased volume of ascites with  mild nodularity throughout the omental and mesenteric fat, suspicious for carcinomatosis. 2. Interval placement of a plastic biliary stent through the  metallic wall stent which appears well positioned. However, there is increased intrahepatic biliary dilatation and no pneumobilia, suspicious for stent dysfunction. Correlate with liver function studies. 3. Previous embolization procedure for intrahepatic cholangiocarcinoma. No recurrent focal mass lesion identified. 4. No evidence of bowel obstruction or perforation. Possible mild small bowel wall thickening in the mid abdomen. 5. Stable 4 mm right middle lobe pulmonary nodule. 6. Stable calcified bladder calculus. 7. Interval increased diffuse subcutaneous edema, asymmetric to the right. 8.  Aortic Atherosclerosis (ICD10-I70.0). Electronically Signed   By: Richardean Sale M.D.   On: 11/07/2022 14:05   DG Chest Port 1 View  Result Date: 11/07/2022 CLINICAL DATA:  Questionable sepsis - evaluate for abnormality EXAM: PORTABLE CHEST - 1 VIEW COMPARISON:  04/09/2022 FINDINGS: Right arm PICC line has been placed to the distal SVC, the right IJ port removed. Lungs are clear. Heart size and mediastinal contours are within normal limits. No effusion. Visualized bones unremarkable. IMPRESSION: No acute cardiopulmonary disease. Electronically Signed   By: Lucrezia Europe M.D.   On: 11/07/2022 12:03   US Paracentesis  Result Date: 11/02/2022 INDICATION: Malignant ascites. EXAM: ULTRASOUND GUIDED  PARACENTESIS MEDICATIONS: None. COMPLICATIONS: None immediate. PROCEDURE: Informed written consent was obtained from the patient after a discussion of the risks, benefits and alternatives to treatment. A timeout was performed prior to the initiation of the procedure. Initial ultrasound scanning demonstrates a large amount of ascites within the right lower abdominal quadrant. The right lower abdomen was prepped and draped in the usual sterile fashion. 1% lidocaine was used for local  anesthesia. Following this, a 8 Fr Safe-T-Centesis catheter was introduced. An ultrasound image was saved for documentation purposes. The paracentesis was performed. The catheter was removed and a dressing was applied. The patient tolerated the procedure well without immediate post procedural complication. FINDINGS: A total of approximately 3.3 L of yellowish fluid was removed. IMPRESSION: Successful ultrasound-guided paracentesis yielding 3.3 liters of peritoneal fluid. Electronically Signed   By: Franki Cabot M.D.   On: 11/02/2022 12:22   DG ERCP  Result Date: 10/23/2022 CLINICAL DATA:  [id number] Surgery, elective [id number] EXAM: ERCP COMPARISON:  MRCP, 10/19/2022.  CT [facility], 08/28/2022. FLUOROSCOPY: Exposure Index (as provided by the fluoroscopic device): 88.4 mGy Kerma FINDINGS: Multiple, limited oblique planar images of the RIGHT upper quadrant obtained C-arm. Multiple embolization coils within liver and pre-existing metallic biliary stent are present. Images demonstrating flexible endoscopy, biliary duct cannulation, retrograde cholangiogram and balloon dilatation of biliary ductal stricture and plastic biliary stent placement. A short segment stricture at the level of the hepatic hilum and proximal common bile duct is present. No overt intrahepatic biliary ductal dilation. IMPRESSION: Fluoroscopic imaging for ERCP. Balloon angioplasty of biliary stricture and plastic biliary stent placement. For complete description of intra procedural findings, please see performing service dictation. Electronically Signed   By: Michaelle Birks M.D.   On: 10/23/2022 11:55   DG C-Arm 1-60 Min-No Report  Result Date: 10/23/2022 Fluoroscopy was utilized by the requesting physician.  No radiographic interpretation.   MR ABDOMEN MRCP W WO CONTAST  Result Date: 10/20/2022 CLINICAL DATA:  Intrahepatic cholangiocarcinoma, elevated bilirubin EXAM: MRI ABDOMEN WITHOUT AND WITH CONTRAST (INCLUDING MRCP) TECHNIQUE: Multiplanar  multisequence MR imaging of the abdomen was performed both before and after the administration of intravenous contrast. Heavily T2-weighted images of the biliary and pancreatic ducts were obtained, and three-dimensional MRCP images were rendered by post processing. CONTRAST:  19mL GADAVIST GADOBUTROL 1 MMOL/ML IV SOLN COMPARISON:  CT chest abdomen pelvis,  08/28/2022 MR abdomen, 02/25/2021 FINDINGS: Lower chest: No acute abnormality. Hepatobiliary: No directly visualized mass or suspicious contrast enhancement in the liver. Susceptibility artifact related to coiling in the right lobe of the liver (series 23, image 37, 45). Single large gallstone contracted in the central gallbladder near the gallbladder neck (series 4, image 21). Moderate intrahepatic biliary ductal dilatation, similar in appearance to prior examination. No directly visualized mass. Common bile duct stent, although poorly visualized by MRI is an unchanged positioned within the central common bile duct. Pancreas: Unremarkable. No pancreatic ductal dilatation or surrounding inflammatory changes. Spleen: Normal in size without significant abnormality. Adrenals/Urinary Tract: Adrenal glands are unremarkable. Kidneys are normal, without renal calculi, solid lesion, or hydronephrosis. Stomach/Bowel: Stomach is within normal limits. No evidence of bowel wall thickening, distention, or inflammatory changes. Vascular/Lymphatic: Severe aortic atherosclerosis. No enlarged abdominal lymph nodes. Other: No abdominal wall hernia or abnormality. Small volume ascites throughout the upper abdomen, similar in volume to prior examination. Musculoskeletal: No acute or significant osseous findings. IMPRESSION: 1. Moderate intrahepatic biliary ductal dilatation, similar in appearance to prior examination. No directly visualized mass. Common bile duct stent, although poorly visualized by MRI is in unchanged positioned within the central common bile duct. 2. Small volume  ascites similar to prior examination. 3. Single large gallstone contracted in the central gallbladder near the gallbladder neck. 4. Severe aortic atherosclerosis. Aortic Atherosclerosis (ICD10-I70.0). Electronically Signed   By: Delanna Ahmadi M.D.   On: 10/20/2022 09:30   MR 3D Recon At Scanner  Result Date: 10/20/2022 CLINICAL DATA:  Intrahepatic cholangiocarcinoma, elevated bilirubin EXAM: MRI ABDOMEN WITHOUT AND WITH CONTRAST (INCLUDING MRCP) TECHNIQUE: Multiplanar multisequence MR imaging of the abdomen was performed both before and after the administration of intravenous contrast. Heavily T2-weighted images of the biliary and pancreatic ducts were obtained, and three-dimensional MRCP images were rendered by post processing. CONTRAST:  94mL GADAVIST GADOBUTROL 1 MMOL/ML IV SOLN COMPARISON:  CT chest abdomen pelvis, 08/28/2022 MR abdomen, 02/25/2021 FINDINGS: Lower chest: No acute abnormality. Hepatobiliary: No directly visualized mass or suspicious contrast enhancement in the liver. Susceptibility artifact related to coiling in the right lobe of the liver (series 23, image 37, 45). Single large gallstone contracted in the central gallbladder near the gallbladder neck (series 4, image 21). Moderate intrahepatic biliary ductal dilatation, similar in appearance to prior examination. No directly visualized mass. Common bile duct stent, although poorly visualized by MRI is an unchanged positioned within the central common bile duct. Pancreas: Unremarkable. No pancreatic ductal dilatation or surrounding inflammatory changes. Spleen: Normal in size without significant abnormality. Adrenals/Urinary Tract: Adrenal glands are unremarkable. Kidneys are normal, without renal calculi, solid lesion, or hydronephrosis. Stomach/Bowel: Stomach is within normal limits. No evidence of bowel wall thickening, distention, or inflammatory changes. Vascular/Lymphatic: Severe aortic atherosclerosis. No enlarged abdominal lymph nodes.  Other: No abdominal wall hernia or abnormality. Small volume ascites throughout the upper abdomen, similar in volume to prior examination. Musculoskeletal: No acute or significant osseous findings. IMPRESSION: 1. Moderate intrahepatic biliary ductal dilatation, similar in appearance to prior examination. No directly visualized mass. Common bile duct stent, although poorly visualized by MRI is in unchanged positioned within the central common bile duct. 2. Small volume ascites similar to prior examination. 3. Single large gallstone contracted in the central gallbladder near the gallbladder neck. 4. Severe aortic atherosclerosis. Aortic Atherosclerosis (ICD10-I70.0). Electronically Signed   By: Delanna Ahmadi M.D.   On: 10/20/2022 09:30   US Paracentesis  Result Date: 10/19/2022 INDICATION: Malignant ascites, cholangiocarcinoma EXAM:  ULTRASOUND GUIDED THERAPEUTIC PARACENTESIS MEDICATIONS: None. COMPLICATIONS: None immediate. PROCEDURE: Informed written consent was obtained from the patient after a discussion of the risks, benefits and alternatives to treatment. A timeout was performed prior to the initiation of the procedure. Initial ultrasound scanning demonstrates a large amount of ascites within the right lower abdominal quadrant. The right lower abdomen was prepped and draped in the usual sterile fashion. 1% lidocaine was used for local anesthesia. Following this, a 19 gauge, 7-cm, Yueh catheter was introduced. An ultrasound image was saved for documentation purposes. The paracentesis was performed. The catheter was removed and a dressing was applied. The patient tolerated the procedure well without immediate post procedural complication. Patient received post-procedure intravenous albumin; see nursing notes for details. FINDINGS: A total of approximately 3.5 L of yellow ascitic fluid was removed. IMPRESSION: Successful ultrasound-guided paracentesis yielding 3.5 liters of peritoneal fluid. Electronically  Signed   By: Lavonia Dana M.D.   On: 10/19/2022 11:42    Labs:  CBC: Recent Labs    10/25/22 0346 11/02/22 0748 11/07/22 1213 11/08/22 0357  WBC 4.4 7.4 24.3* 12.6*  HGB 8.5* 9.6* 9.3*  9.2* 7.9*  HCT 25.6* 28.0* 27.2*  27.0* 22.1*  PLT 111* 122* 92* 79*    COAGS: Recent Labs    08/19/22 1341 09/02/22 1359 10/22/22 1147 11/07/22 1213  INR 3.2* 3.0 1.2 1.3*  APTT  --   --   --  39*    BMP: Recent Labs    10/25/22 0346 11/02/22 0748 11/07/22 1213 11/08/22 0357  NA 134* 129* 124*  128* 127*  K 4.2 4.3 4.5  4.6 3.8  CL 107 98 96*  96* 97*  CO2 22 21* 20* 19*  GLUCOSE 104* 153* 161*  155* 98  BUN 18 22 37*  32* 36*  CALCIUM 8.2* 8.0* 7.9* 7.7*  CREATININE 1.49* 1.50* 2.51*  2.90* 2.44*  GFRNONAA 51* 51* 28* 28*    LIVER FUNCTION TESTS: Recent Labs    10/25/22 0346 11/02/22 0748 11/07/22 1213 11/08/22 0357  BILITOT 2.7* 2.9* 4.0* 3.3*  AST 80* 136* 92* 107*  ALT 70* 82* 59* 57*  ALKPHOS 212* 353* 365* 278*  PROT 5.1* 5.7* 5.6* 4.8*  ALBUMIN 1.9* 2.2* 1.9* 1.5*    TUMOR MARKERS: No results for input(s): "AFPTM", "CEA", "CA199", "CHROMGRNA" in the last 8760 hours.  Assessment and Plan:  Intrahepatic biliary duct dilation. Plans to remove existing stent tomorrow.  Will proceed with image guided biliary drain placement and cholangiography on Tuesday.  Risks and benefits of biliary drain placement discussed with the patient including, but not limited to bleeding, infection which may lead to sepsis or even death and damage to adjacent structures.  This interventional procedure involves the use of X-rays and because of the nature of the planned procedure, it is possible that we will have prolonged use of X-ray fluoroscopy.  Potential radiation risks to you include (but are not limited to) the following: - A slightly elevated risk for cancer  several years later in life. This risk is typically less than 0.5% percent. This risk is low in comparison  to the normal incidence of human cancer, which is 33% for women and 50% for men according to the [place]. - Radiation induced injury can include skin redness, resembling a rash, tissue breakdown / ulcers and hair loss (which can be temporary or permanent).   The likelihood of either of these occurring depends on the difficulty of the procedure and whether you  are sensitive to radiation due to previous procedures, disease, or genetic conditions.   IF your procedure requires a prolonged use of radiation, you will be notified and given written instructions for further action.  It is your responsibility to monitor the irradiated area for the 2 weeks following the procedure and to notify your physician if you are concerned that you have suffered a radiation induced injury.    All of the patient's questions were answered, patient is agreeable to proceed.  Consent signed and in IR  Thank you for allowing our service to participate in AIMEN SCHWEERS 's care.  Electronically Signed: Murrell Redden, PA-C   11/08/2022, 1:59 PM      I spent a total of    25 Minutes in face to face in clinical consultation, greater than 50% of which was counseling/coordinating care for biliary drain.

## 2022-11-08 NOTE — Progress Notes (Signed)
PHARMACY - PHYSICIAN COMMUNICATION CRITICAL VALUE ALERT - BLOOD CULTURE IDENTIFICATION (BCID)  Jonathan Keith is an 67 y.o. male who presented to [facility] on 11/07/2022 with a chief complaint of fever, weakness, and upper quadrant pain. Patient with h/o metastatic cholangiocarcinoma.   Assessment:  Enterococcus (no resistance) in 1/4 blood culture bottles  Name of physician (or Provider) Contacted: [place]  Current antibiotics: Vancomycin/CTX  Changes to prescribed antibiotics recommended:   D/W Dr. Lonny Prude, no change for now pending ID consult due to concern for concurrent SBP  Results for orders placed or performed during the hospital encounter of 11/07/22  Blood Culture ID Panel (Reflexed) (Collected: 11/07/2022 12:13 PM)  Result Value Ref Range   Enterococcus faecalis DETECTED (A) NOT DETECTED   Enterococcus Faecium NOT DETECTED NOT DETECTED   Listeria monocytogenes NOT DETECTED NOT DETECTED   Staphylococcus species NOT DETECTED NOT DETECTED   Staphylococcus aureus (BCID) NOT DETECTED NOT DETECTED   Staphylococcus epidermidis NOT DETECTED NOT DETECTED   Staphylococcus lugdunensis NOT DETECTED NOT DETECTED   Streptococcus species NOT DETECTED NOT DETECTED   Streptococcus agalactiae NOT DETECTED NOT DETECTED   Streptococcus pneumoniae NOT DETECTED NOT DETECTED   Streptococcus pyogenes NOT DETECTED NOT DETECTED   A.calcoaceticus-baumannii NOT DETECTED NOT DETECTED   Bacteroides fragilis NOT DETECTED NOT DETECTED   Enterobacterales NOT DETECTED NOT DETECTED   Enterobacter cloacae complex NOT DETECTED NOT DETECTED   Escherichia coli NOT DETECTED NOT DETECTED   Klebsiella aerogenes NOT DETECTED NOT DETECTED   Klebsiella oxytoca NOT DETECTED NOT DETECTED   Klebsiella pneumoniae NOT DETECTED NOT DETECTED   Proteus species NOT DETECTED NOT DETECTED   Salmonella species NOT DETECTED NOT DETECTED   Serratia marcescens NOT DETECTED NOT DETECTED   Haemophilus influenzae NOT DETECTED  NOT DETECTED   Neisseria meningitidis NOT DETECTED NOT DETECTED   Pseudomonas aeruginosa NOT DETECTED NOT DETECTED   Stenotrophomonas maltophilia NOT DETECTED NOT DETECTED   Candida albicans NOT DETECTED NOT DETECTED   Candida auris NOT DETECTED NOT DETECTED   Candida glabrata NOT DETECTED NOT DETECTED   Candida krusei NOT DETECTED NOT DETECTED   Candida parapsilosis NOT DETECTED NOT DETECTED   Candida tropicalis NOT DETECTED NOT DETECTED   Cryptococcus neoformans/gattii NOT DETECTED NOT DETECTED   Vancomycin resistance NOT DETECTED NOT DETECTED    Tranesha Lessner A. Levada Dy, PharmD, BCPS, FNKF Clinical Pharmacist [place] Please utilize Amion for appropriate phone number to reach the unit pharmacist ([place])  11/08/2022  9:07 AM

## 2022-11-08 NOTE — Consult Note (Signed)
[place] for Infectious Disease  Total days of antibiotics 2       Reason for Consult: enterococcal bacteremia in of setting of cholangitis   Referring Physician: nettey  Principal Problem:   Metastatic cholangiocarcinoma to bile duct ([place]) Active Problems:   Mixed hyperlipidemia   Transaminitis   Leukocytosis   Hyponatremia   GERD (gastroesophageal reflux disease)   Hypoalbuminemia due to protein-calorie malnutrition (HCC)   Sepsis (HCC)   Hyperbilirubinemia   Dehydration   Nausea and vomiting   Ischemic cardiomyopathy   Acute kidney injury superimposed on chronic kidney disease (HCC)   Biliary obstruction   Lactic acidosis   Prediabetes   Malignant ascites   Abdominal distention    HPI: Jonathan Keith is a 67 y.o. male with hx of unresectable cholangiocarcinoma followed by dr Rush Landmark. Most recently in Nov 2023, biliary stent changed to metal stent. Earlier in march found to have worsening transaminitis and hyperbilirubinemia. RI/MCRP showed moderate intrahepatic biliary ductal dilatation. He underwent ERCP on 3/15 where plastic biliary stent placed for severe stricture in the left main hepatic duct for plan to exchange in 3-55m onths. He presented to [facility] with 3 days of fevers and weakness associated with worsening abdominal pain. Temp noted to be 101F. Labs showing leukocytosis with wbc of 24K, abd CT showing worsening intrahepatic biliary dilation despite recent stent and increased ascites. He was started on broad spectrum abtx but infectious work up revealing + blood cx for enterococcus species. He is slightly improved since starting abtx and hospitalized  ROS: bilateral jaw pain with eating  GI evaluating patient for further management to remove obstructive stent and have IR place exteran biliary drain.  Past Medical History:  Diagnosis Date   Cancer ([place])    CHF (congestive heart failure) (HCC)    Chronic pain    neck, back, knees   Class 1 obesity due to  excess calories with body mass index (BMI) of 31.0 to 31.9 in adult    Claudication [facility])    Coronary artery disease    DDD (degenerative disc disease), cervical    Diabetes mellitus ([place])    Dyslipidemia    Hypertension    Port-A-Cath in place 07/20/2021   Tobacco dependence     Allergies: No Known Allergies  Current antibiotics:   MEDICATIONS:  Chlorhexidine Gluconate Cloth  6 each Topical Daily   [START ON 11/09/2022] feeding supplement  237 mL Oral TID BM   multivitamin with minerals  1 tablet Oral Daily   pantoprazole (PROTONIX) IV  40 mg Intravenous Q24H   sodium chloride flush  10-40 mL Intracatheter Q12H    Social History   Tobacco Use   Smoking status: Every Day    Packs/day: 0.50    Years: 56.00    Additional pack years: 0.00    Total pack years: 28.00    Types: Cigarettes   Smokeless tobacco: Never  Vaping Use   Vaping Use: Never used  Substance Use Topics   Alcohol use: Not Currently    Comment: stopped 2.5 years ago 03/11/21   Drug use: Never    Family History  Problem Relation Age of Onset   CAD Mother    Cancer Neg Hx     Review of Systems -  12 point ros except what is mentioned above  OBJECTIVE: Temp:  [98.4 F (36.9 C)-100.2 F (37.9 C)] 98.9 F (37.2 C) (03/31 0803) Pulse Rate:  [99-122] 100 (03/31 0803) Resp:  [17-20] 17 (  03/31 0803) BP: (88-133)/(52-76) 91/55 (03/31 0803) SpO2:  [87 %-98 %] 92 % (03/31 0803) Weight:  [75.8 kg] 75.8 kg (03/30 1203) Physical Exam  Constitutional: He is oriented to person, place, and time. He appears thin and No distress.  HENT: missing a few teeth but no overt abnormality Mouth/Throat: Oropharynx is clear and moist. No oropharyngeal exudate.  Cardiovascular: Normal rate, regular rhythm and normal heart sounds. Exam reveals no gallop and no friction rub.  No murmur heard.  Pulmonary/Chest: Effort normal and breath sounds normal. No respiratory distress. He has no wheezes.  Abdominal: Soft. Bowel sounds  are normal. He exhibits no distension. There is no tenderness.  Lymphadenopathy:  He has no cervical adenopathy.  Neurological: He is alert and oriented to person, place, and time.  Skin: Skin is warm and dry. No rash noted. No erythema.  Psychiatric: He has a normal mood and affect. His behavior is normal.    LABS: Results for orders placed or performed during the hospital encounter of 11/07/22 (from the past 48 hour(s))  Lactic acid, plasma     Status: Abnormal   Collection Time: 11/07/22 12:13 PM  Result Value Ref Range   Lactic Acid, Venous 3.3 (HH) 0.5 - 1.9 mmol/L    Comment: CRITICAL RESULT CALLED TO, READ BACK BY AND VERIFIED WITH TALBET, S AT 1312 ON 11/07/22 BY SMN. Performed at [facility], [address] [place], [place] [address]   Comprehensive metabolic panel     Status: Abnormal   Collection Time: 11/07/22 12:13 PM  Result Value Ref Range   Sodium 124 (L) 135 - 145 mmol/L   Potassium 4.5 3.5 - 5.1 mmol/L   Chloride 96 (L) 98 - 111 mmol/L   CO2 20 (L) 22 - 32 mmol/L   Glucose, Bld 161 (H) 70 - 99 mg/dL    Comment: Glucose reference range applies only to samples taken after fasting for at least 8 hours.   BUN 37 (H) 8 - 23 mg/dL   Creatinine, Ser 2.51 (H) 0.61 - 1.24 mg/dL   Calcium 7.9 (L) 8.9 - 10.3 mg/dL   Total Protein 5.6 (L) 6.5 - 8.1 g/dL   Albumin 1.9 (L) 3.5 - 5.0 g/dL   AST 92 (H) 15 - 41 U/L   ALT 59 (H) 0 - 44 U/L   Alkaline Phosphatase 365 (H) 38 - 126 U/L   Total Bilirubin 4.0 (H) 0.3 - 1.2 mg/dL   GFR, Estimated 28 (L) >60 mL/min    Comment: (NOTE) Calculated using the CKD-EPI Creatinine Equation (2021)    Anion gap 8 5 - 15    Comment: Performed at [facility], [address] [place], [place] [address]  CBC with Differential     Status: Abnormal   Collection Time: 11/07/22 12:13 PM  Result Value Ref Range   WBC 24.3 (H) 4.0 - 10.5 K/uL   RBC 2.77 (L) 4.22 - 5.81 MIL/uL   Hemoglobin 9.3 (L) 13.0 - 17.0 g/dL   HCT 27.2 (L) 39.0 - 52.0 %    MCV 98.2 80.0 - 100.0 fL   MCH 33.6 26.0 - 34.0 pg   MCHC 34.2 30.0 - 36.0 g/dL   RDW 15.9 (H) 11.5 - 15.5 %   Platelets 92 (L) 150 - 400 K/uL    Comment: Immature Platelet Fraction may be clinically indicated, consider ordering this additional test [id number]    nRBC 0.0 0.0 - 0.2 %   Neutrophils Relative % 89 %   Neutro Abs  21.6 (H) 1.7 - 7.7 K/uL   Lymphocytes Relative 2 %   Lymphs Abs 0.6 (L) 0.7 - 4.0 K/uL   Monocytes Relative 8 %   Monocytes Absolute 1.9 (H) 0.1 - 1.0 K/uL   Eosinophils Relative 0 %   Eosinophils Absolute 0.0 0.0 - 0.5 K/uL   Basophils Relative 0 %   Basophils Absolute 0.0 0.0 - 0.1 K/uL   Immature Granulocytes 1 %   Abs Immature Granulocytes 0.27 (H) 0.00 - 0.07 K/uL    Comment: Performed at [facility], [address], [place], [place] [address]  Protime-INR     Status: Abnormal   Collection Time: 11/07/22 12:13 PM  Result Value Ref Range   Prothrombin Time 16.2 (H) 11.4 - 15.2 seconds   INR 1.3 (H) 0.8 - 1.2    Comment: (NOTE) INR goal varies based on device and disease states. Performed at [facility], [address] [place], [place] [address]   APTT     Status: Abnormal   Collection Time: 11/07/22 12:13 PM  Result Value Ref Range   aPTT 39 (H) 24 - 36 seconds    Comment:        IF BASELINE aPTT IS ELEVATED, SUGGEST PATIENT RISK ASSESSMENT BE USED TO DETERMINE APPROPRIATE ANTICOAGULANT THERAPY. Performed at [facility], [address], [place], [place] [address]   Blood Culture (routine x 2)     Status: None (Preliminary result)   Collection Time: 11/07/22 12:13 PM   Specimen: Left Antecubital; Blood  Result Value Ref Range   Specimen Description      LEFT ANTECUBITAL BOTTLES DRAWN AEROBIC AND ANAEROBIC Performed at [facility], [address], [place], [place] [address]    Special Requests      Blood Culture adequate volume Performed at [facility], [address] [place], [place] [address]    Culture  Setup Time      GRAM POSITIVE  COCCI Gram Stain Report Called to,Read Back By and Verified With: LOPEZ,V @ [id number] ON 11/08/22 BY JUW AEROBIC BOTTLE ONLY GS DONE @ [facility] CRITICAL RESULT CALLED TO, READ BACK BY AND VERIFIED WITH: [place]:[id number] AT [id number] BY EC  ANAB REVIEWED BY A. LAFRANCE Performed at [facility], [place] [address], [place], [place] [address]    Culture GRAM POSITIVE COCCI    Report Status PENDING   Blood Culture (routine x 2)     Status: None (Preliminary result)   Collection Time: 11/07/22 12:13 PM   Specimen: BLOOD LEFT HAND  Result Value Ref Range   Specimen Description      BLOOD LEFT HAND BOTTLES DRAWN AEROBIC AND ANAEROBIC Performed at [facility], [address], [place], [place] [address]    Special Requests      Blood Culture adequate volume Performed at [facility], [address], [place], [place] [address]    Culture  Setup Time      GRAM POSITIVE COCCI Gram Stain Report Called to,Read Back By and Verified With: B.BARNES @ 0841 BY STEPHTR 11/08/22  REVIEWED BY A. LAFRANCE CRITICAL VALUE NOTED.  VALUE IS CONSISTENT WITH PREVIOUSLY REPORTED AND CALLED VALUE. Performed at [facility], [place] [address], [place],  [address]    Culture GRAM POSITIVE COCCI    Report Status PENDING   I-stat chem 8, ED     Status: Abnormal   Collection Time: 11/07/22 12:13 PM  Result Value Ref Range   Sodium 128 (L) 135 - 145 mmol/L  Potassium 4.6 3.5 - 5.1 mmol/L   Chloride 96 (L) 98 - 111 mmol/L   BUN 32 (H) 8 - 23 mg/dL   Creatinine, Ser 2.90 (H) 0.61 - 1.24 mg/dL   Glucose, Bld 155 (H) 70 - 99 mg/dL    Comment: Glucose reference range applies only to samples taken after fasting for at least 8 hours.   Calcium, Ion 1.13 (L) 1.15 - 1.40 mmol/L   TCO2 21 (L) 22 - 32 mmol/L   Hemoglobin 9.2 (L) 13.0 - 17.0 g/dL   HCT 27.0 (L) 39.0 - 52.0 %  Blood Culture ID Panel (Reflexed)     Status: Abnormal   Collection Time: 11/07/22 12:13 PM  Result Value Ref Range   Enterococcus  faecalis DETECTED (A) NOT DETECTED    Comment: CRITICAL RESULT CALLED TO, READ BACK BY AND VERIFIED WITH: PHARMD CATHY PIERCE OZ:[id number] AT 0846 BY EC    Enterococcus Faecium NOT DETECTED NOT DETECTED   Listeria monocytogenes NOT DETECTED NOT DETECTED   Staphylococcus species NOT DETECTED NOT DETECTED   Staphylococcus aureus (BCID) NOT DETECTED NOT DETECTED   Staphylococcus epidermidis NOT DETECTED NOT DETECTED   Staphylococcus lugdunensis NOT DETECTED NOT DETECTED   Streptococcus species NOT DETECTED NOT DETECTED   Streptococcus agalactiae NOT DETECTED NOT DETECTED   Streptococcus pneumoniae NOT DETECTED NOT DETECTED   Streptococcus pyogenes NOT DETECTED NOT DETECTED   A.calcoaceticus-baumannii NOT DETECTED NOT DETECTED   Bacteroides fragilis NOT DETECTED NOT DETECTED   Enterobacterales NOT DETECTED NOT DETECTED   Enterobacter cloacae complex NOT DETECTED NOT DETECTED   Escherichia coli NOT DETECTED NOT DETECTED   Klebsiella aerogenes NOT DETECTED NOT DETECTED   Klebsiella oxytoca NOT DETECTED NOT DETECTED   Klebsiella pneumoniae NOT DETECTED NOT DETECTED   Proteus species NOT DETECTED NOT DETECTED   Salmonella species NOT DETECTED NOT DETECTED   Serratia marcescens NOT DETECTED NOT DETECTED   Haemophilus influenzae NOT DETECTED NOT DETECTED   Neisseria meningitidis NOT DETECTED NOT DETECTED   Pseudomonas aeruginosa NOT DETECTED NOT DETECTED   Stenotrophomonas maltophilia NOT DETECTED NOT DETECTED   Candida albicans NOT DETECTED NOT DETECTED   Candida auris NOT DETECTED NOT DETECTED   Candida glabrata NOT DETECTED NOT DETECTED   Candida krusei NOT DETECTED NOT DETECTED   Candida parapsilosis NOT DETECTED NOT DETECTED   Candida tropicalis NOT DETECTED NOT DETECTED   Cryptococcus neoformans/gattii NOT DETECTED NOT DETECTED   Vancomycin resistance NOT DETECTED NOT DETECTED    Comment: Performed at [facility], [address] [place], [place] [address]  Resp panel by RT-PCR  (RSV, Flu A&B, Covid) Anterior Nasal Swab     Status: None   Collection Time: 11/07/22  1:00 PM   Specimen: Anterior Nasal Swab  Result Value Ref Range   SARS Coronavirus 2 by RT PCR NEGATIVE NEGATIVE    Comment: (NOTE) SARS-CoV-2 target nucleic acids are NOT DETECTED.  The SARS-CoV-2 RNA is generally detectable in upper respiratory specimens during the acute phase of infection. The lowest concentration of SARS-CoV-2 viral copies this assay can detect is 138 copies/mL. A negative result does not preclude SARS-Cov-2 infection and should not be used as the sole basis for treatment or other patient management decisions. A negative result may occur with  improper specimen collection/handling, submission of specimen other than nasopharyngeal swab, presence of viral mutation(s) within the areas targeted by this assay, and inadequate number of viral copies(<138 copies/mL). A negative result must be combined with clinical observations, patient history, and epidemiological  information. The expected result is Negative.  Fact Sheet for Patients:  EntrepreneurPulse.com.au  Fact Sheet for Healthcare Providers:  IncredibleEmployment.be  This test is no t yet approved or cleared by the [place] FDA and  has been authorized for detection and/or diagnosis of SARS-CoV-2 by FDA under an Emergency Use Authorization (EUA). This EUA will remain  in effect (meaning this test can be used) for the duration of the COVID-19 declaration under Section 564(b)(1) of the Act, 21 U.S.C.section 360bbb-3(b)(1), unless the authorization is terminated  or revoked sooner.       Influenza A by PCR NEGATIVE NEGATIVE   Influenza B by PCR NEGATIVE NEGATIVE    Comment: (NOTE) The Xpert Xpress SARS-CoV-2/FLU/RSV plus assay is intended as an aid in the diagnosis of influenza from Nasopharyngeal swab specimens and should not be used as a sole basis for treatment. Nasal washings  and aspirates are unacceptable for Xpert Xpress SARS-CoV-2/FLU/RSV testing.  Fact Sheet for Patients: EntrepreneurPulse.com.au  Fact Sheet for Healthcare Providers: IncredibleEmployment.be  This test is not yet approved or cleared by the [place] FDA and has been authorized for detection and/or diagnosis of SARS-CoV-2 by FDA under an Emergency Use Authorization (EUA). This EUA will remain in effect (meaning this test can be used) for the duration of the COVID-19 declaration under Section 564(b)(1) of the Act, 21 U.S.C. section 360bbb-3(b)(1), unless the authorization is terminated or revoked.     Resp Syncytial Virus by PCR NEGATIVE NEGATIVE    Comment: (NOTE) Fact Sheet for Patients: EntrepreneurPulse.com.au  Fact Sheet for Healthcare Providers: IncredibleEmployment.be  This test is not yet approved or cleared by the [place] FDA and has been authorized for detection and/or diagnosis of SARS-CoV-2 by FDA under an Emergency Use Authorization (EUA). This EUA will remain in effect (meaning this test can be used) for the duration of the COVID-19 declaration under Section 564(b)(1) of the Act, 21 U.S.C. section 360bbb-3(b)(1), unless the authorization is terminated or revoked.  Performed at [facility], [address], [place], [place] [address]   Lactic acid, plasma     Status: Abnormal   Collection Time: 11/07/22  2:02 PM  Result Value Ref Range   Lactic Acid, Venous 3.8 (HH) 0.5 - 1.9 mmol/L    Comment: CRITICAL VALUE NOTED. VALUE IS CONSISTENT WITH PREVIOUSLY CRITICAL REPORTED/CALLED VALUE Performed at [facility], [address], [place], [place] [address]   Urinalysis, Routine w reflex microscopic -Urine, Catheterized     Status: Abnormal   Collection Time: 11/07/22  2:44 PM  Result Value Ref Range   Color, Urine AMBER (A) YELLOW    Comment: BIOCHEMICALS MAY BE AFFECTED BY COLOR    APPearance CLOUDY (A) CLEAR   Specific Gravity, Urine 1.017 1.005 - 1.030   pH 5.0 5.0 - 8.0   Glucose, UA NEGATIVE NEGATIVE mg/dL   Hgb urine dipstick SMALL (A) NEGATIVE   Bilirubin Urine NEGATIVE NEGATIVE   Ketones, ur NEGATIVE NEGATIVE mg/dL   Protein, ur 30 (A) NEGATIVE mg/dL   Nitrite NEGATIVE NEGATIVE   Leukocytes,Ua NEGATIVE NEGATIVE   RBC / HPF 6-10 0 - 5 RBC/hpf   WBC, UA 0-5 0 - 5 WBC/hpf   Bacteria, UA RARE (A) NONE SEEN   Squamous Epithelial / HPF 0-5 0 - 5 /HPF   Hyaline Casts, UA PRESENT    Granular Casts, UA PRESENT    Amorphous Crystal PRESENT     Comment: Performed at [facility], [address], [place], [place] [address]  Osmolality  Status: None   Collection Time: 11/08/22  3:57 AM  Result Value Ref Range   Osmolality 280 275 - 295 mOsm/kg    Comment: Performed at [facility], [place] [address] [place], [place] [address]  Comprehensive metabolic panel     Status: Abnormal   Collection Time: 11/08/22  3:57 AM  Result Value Ref Range   Sodium 127 (L) 135 - 145 mmol/L   Potassium 3.8 3.5 - 5.1 mmol/L   Chloride 97 (L) 98 - 111 mmol/L   CO2 19 (L) 22 - 32 mmol/L   Glucose, Bld 98 70 - 99 mg/dL    Comment: Glucose reference range applies only to samples taken after fasting for at least 8 hours.   BUN 36 (H) 8 - 23 mg/dL   Creatinine, Ser 2.44 (H) 0.61 - 1.24 mg/dL   Calcium 7.7 (L) 8.9 - 10.3 mg/dL   Total Protein 4.8 (L) 6.5 - 8.1 g/dL   Albumin 1.5 (L) 3.5 - 5.0 g/dL   AST 107 (H) 15 - 41 U/L   ALT 57 (H) 0 - 44 U/L   Alkaline Phosphatase 278 (H) 38 - 126 U/L   Total Bilirubin 3.3 (H) 0.3 - 1.2 mg/dL   GFR, Estimated 28 (L) >60 mL/min    Comment: (NOTE) Calculated using the CKD-EPI Creatinine Equation (2021)    Anion gap 11 5 - 15    Comment: Performed at [facility], [place] [address] [place], [place] [address]  CBC     Status: Abnormal   Collection Time: 11/08/22  3:57 AM  Result Value Ref Range   WBC 12.6 (H) 4.0 - 10.5 K/uL   RBC  2.34 (L) 4.22 - 5.81 MIL/uL   Hemoglobin 7.9 (L) 13.0 - 17.0 g/dL   HCT 22.1 (L) 39.0 - 52.0 %   MCV 94.4 80.0 - 100.0 fL   MCH 33.8 26.0 - 34.0 pg   MCHC 35.7 30.0 - 36.0 g/dL   RDW 16.3 (H) 11.5 - 15.5 %   Platelets 79 (L) 150 - 400 K/uL    Comment: Immature Platelet Fraction may be clinically indicated, consider ordering this additional test [id number] REPEATED TO VERIFY    nRBC 0.0 0.0 - 0.2 %    Comment: Performed at [facility], [place] [address], [place], [place] [address]  Magnesium     Status: Abnormal   Collection Time: 11/08/22  3:57 AM  Result Value Ref Range   Magnesium 1.5 (L) 1.7 - 2.4 mg/dL    Comment: Performed at [facility] [address] [place], [place] [address]  Phosphorus     Status: None   Collection Time: 11/08/22  3:57 AM  Result Value Ref Range   Phosphorus 4.0 2.5 - 4.6 mg/dL    Comment: Performed at [facility] [address], [place], [place] [address]    MICRO:  IMAGING: CT ABDOMEN PELVIS WO CONTRAST  Result Date: 11/07/2022 CLINICAL DATA:  Abdominal pain, acute, nonlocalized. Fever with weakness and dizziness for 3 days. Jaundice. History of intrahepatic cholangiocarcinoma. * Tracking Code: BO * EXAM: CT ABDOMEN AND PELVIS WITHOUT CONTRAST TECHNIQUE: Multidetector CT imaging of the abdomen and pelvis was performed following the standard protocol without IV contrast. RADIATION DOSE REDUCTION: This exam was performed according to the departmental dose-optimization program which includes automated exposure control, adjustment of the mA and/or kV according to patient size and/or use of iterative reconstruction technique. COMPARISON:  Abdominopelvic CT 08/28/2022.  Abdominal MRI 10/19/2022. FINDINGS: Lower chest: 4 mm right middle lobe nodule on image 4/4 is unchanged from the prior chest CT. There is central airway thickening at both lung bases with mild dependent right lower lobe atelectasis. No consolidation or enlarging nodule  identified. Aortic and coronary artery atherosclerosis noted. 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 Hepatobiliary: Previous embolization procedure for intrahepatic cholangiocarcinoma noted centrally in the liver, with associated beam hardening artifact. No recurrent focal mass lesion identified. Previously demonstrated known large gallstone is not well visualized. The gallbladder appears unchanged with mild wall thickening. A metallic biliary stent is in place with interval placement of a plastic biliary stent extending superiorly into the hepatic hilum and inferiorly into the ampulla. There is no pneumobilia, and the intrahepatic biliary dilatation appears slightly increased. Pancreas: Unremarkable. No pancreatic ductal dilatation or surrounding inflammatory changes. Spleen: Normal in size without focal abnormality. Adrenals/Urinary Tract: Both adrenal glands appear normal. No evidence of renal or ureteral calculus, hydronephrosis or perinephric soft tissue stranding. Both kidneys appear unremarkable. A calcified bladder calculus is again noted. Foley catheter extends into the bladder lumen. The bladder is decompressed. Stomach/Bowel: No enteric contrast administered. The stomach appears unremarkable for its degree of distention. There is possible mild small bowel wall thickening in the mid abdomen, but no significant bowel distension. Mild diverticulosis of the distal colon. The colon and appendix otherwise appear unremarkable. Vascular/Lymphatic: There are no enlarged abdominal or pelvic lymph nodes. Diffuse aortic and branch vessel atherosclerosis with chronically displaced intimal calcifications within the infrarenal abdominal aorta. No focal aneurysm. Reproductive: The prostate gland and seminal vesicles appear unremarkable. Other: Interval increased volume of ascites with mild nodularity throughout the omental and mesenteric fat, suspicious for carcinomatosis.  No free intraperitoneal air. Musculoskeletal: No acute or significant osseous findings. Interval increased diffuse subcutaneous edema, asymmetric to the right. Stable mild lumbar spondylosis. IMPRESSION: 1. Interval increased volume of ascites with mild nodularity throughout the omental and mesenteric fat, suspicious for carcinomatosis. 2. Interval placement of a plastic biliary stent through the metallic wall stent which appears well positioned. However, there is increased intrahepatic biliary dilatation and no pneumobilia, suspicious for stent dysfunction. Correlate with liver function studies. 3. Previous embolization procedure for intrahepatic cholangiocarcinoma. No recurrent focal mass lesion identified. 4. No evidence of bowel obstruction or perforation. Possible mild small bowel wall thickening in the mid abdomen. 5. Stable 4 mm right middle lobe pulmonary nodule. 6. Stable calcified bladder calculus. 7. Interval increased diffuse subcutaneous edema, asymmetric to the right. 8.  Aortic Atherosclerosis (ICD10-I70.0). Electronically Signed   By: Richardean Sale M.D.   On: 11/07/2022 14:05   DG Chest Port 1 View  Result Date: 11/07/2022 CLINICAL DATA:  Questionable sepsis - evaluate for abnormality EXAM: PORTABLE CHEST - 1 VIEW COMPARISON:  04/09/2022 FINDINGS: Right arm PICC line has been placed to the distal SVC, the right IJ port removed. Lungs are clear. Heart size and mediastinal contours are within normal limits. No effusion. Visualized bones unremarkable. IMPRESSION: No acute cardiopulmonary disease. Electronically Signed   By: Lucrezia Europe M.D.   On: 11/07/2022 12:03     Assessment/Plan:  [id number] M with metastatic cholangiocarcinoma s/p biliary stents admited for fever, abdominal pina, possible biliary obstruction with secondary enterococcal bacteremia.   Enterococcal bacteremia = will change to amp/sub for the time being Recommend repeat blood cx tomorrow to see that his bacteremia is clearing.  Please get TTE to eval for endocarditis. Acute onset of symptoms  suggest that all related to biliary obstruction  Jaw pain with mastication = possible dental abscess/caries not related to bacteremia. Recommend panorex after procedure tomorrow   Leukocytosis = to improve as infection is treated.

## 2022-11-08 NOTE — Progress Notes (Addendum)
PROGRESS NOTE    Jonathan Keith  [id number] DOB: August 18, 1955 DOA: 11/07/2022 PCP: Redmond School, MD   Brief Narrative: Jonathan Keith is a 67 y.o. male with a history of metastatic cholangiocarcinoma with biliary obstruction s/p stent placement, hyperlipidemia, CAD, ischemic cardiomyopathy, CHF, AKI on CKD stage IV, anemia of chronic disease. Patient presented secondary to fever and weakness with evidence of sepsis presumed secondary to biliary obstruction and SBP. Workup revealed associated enterococcus bacteremia. GI and ID consulted. Plan for percutaneous bile duct drain per IR and ERCP for removal of plastic biliary stent. Patient managed on antibiotics for bacteremia and SBP prophylaxis.   Assessment and Plan:  Sepsis Secondary to biliary obstruction and bacteremia. Concern for possible SBP. -Follow-up blood and ascites cultures  Biliary obstruction Patient with known biliary obstruction secondary to known cholangiocarcinoma with recent biliary stent placement. Concern for biliary stent malfunction. GI consulted with plan for percutaneous bile duct drain per IR and ERCP for removal of plastic biliary stent.  Malignant ascites Metastatic cholangiocarcinoma Associated concern for SBP on admission. -Paracentesis with labs  Nausea/vomiting Secondary to above. -Continue Zofran PRN  AKI on CKD stage IV Baseline creatinine of 1.4-1.6. Creatinine of 2.90 on admission. Presumed secondary to hypovolemia. Some improvement with IV fluid resuscitation. -BMP in AM  Hyponatremia Patient with chronic hyponatremia. Sodium down to a low of 124. Presumed secondary to hypovolemia. Improvement with IV fluids. Sodium of 127 this morning. -BMP in AM  CAD Ischemic cardiomyopathy Home Coreg and lisinopril held secondary to low-normal blood pressure.  Prediabetes Hemoglobin A1C of 5.8%.  GERD -Continue Protonix  Increased nutrient needs Noted by dietitian. No diagnosis of  malnutrition. -Dietician recommendations (3/31): Ensure Enlive po TID, each supplement provides 350 kcal and 20 grams of protein MVI with minerals daily   DVT prophylaxis: SCDs Code Status:   Code Status: Full Code Family Communication: Wife at bedside Disposition Plan: Discharge home likely in 3-5 days pending specialist recommendations/management and outpatient antibiotic regimen   Consultants:  [facility] Infectious disease Interventional radiology  Procedures:  None  Antimicrobials: Vancomycin Flagyl Cefepime Ceftriaxone Unasyn    Subjective: Patient reports no abdominal pain. Feeling better than on admission.  Objective: BP (!) 90/56 (BP Location: Left Arm)   Pulse 88   Temp 98.2 F (36.8 C) (Oral)   Resp 14   Ht 5\' 11"  (1.803 m)   Wt 75.8 kg   SpO2 97%   BMI 23.29 kg/m   Examination:  General exam: Appears calm and comfortable Respiratory system: Clear to auscultation. Respiratory effort normal. Cardiovascular system: S1 & S2 heard, RRR Gastrointestinal system: Abdomen is distended, soft and non-tender. Normal bowel sounds heard. Central nervous system: Alert and oriented. No focal neurological deficits. Musculoskeletal: No calf tenderness Skin: No cyanosis. No rashes Psychiatry: Judgement and insight appear normal. Mood & affect appropriate.    Data Reviewed: I have personally reviewed following labs and imaging studies  CBC Lab Results  Component Value Date   WBC 12.6 (H) 11/08/2022   RBC 2.34 (L) 11/08/2022   HGB 7.9 (L) 11/08/2022   HCT 22.1 (L) 11/08/2022   MCV 94.4 11/08/2022   MCH 33.8 11/08/2022   PLT 79 (L) 11/08/2022   MCHC 35.7 11/08/2022   RDW 16.3 (H) 11/08/2022   LYMPHSABS 0.6 (L) 11/07/2022   MONOABS 1.9 (H) 11/07/2022   EOSABS 0.0 11/07/2022   BASOSABS 0.0 [id number]     Last metabolic panel Lab Results  Component Value Date   NA 127 (  L) 11/08/2022   K 3.8 11/08/2022   CL 97 (L) 11/08/2022   CO2 19  (L) 11/08/2022   BUN 36 (H) 11/08/2022   CREATININE 2.44 (H) 11/08/2022   GLUCOSE 98 11/08/2022   GFRNONAA 28 (L) 11/08/2022   CALCIUM 7.7 (L) 11/08/2022   PHOS 4.0 11/08/2022   PROT 4.8 (L) 11/08/2022   ALBUMIN 1.5 (L) 11/08/2022   LABGLOB 3.1 06/16/2021   AGRATIO 1.2 06/16/2021   BILITOT 3.3 (H) 11/08/2022   ALKPHOS 278 (H) 11/08/2022   AST 107 (H) 11/08/2022   ALT 57 (H) 11/08/2022   ANIONGAP 11 11/08/2022    GFR: Estimated Creatinine Clearance: 31.7 mL/min (A) (by C-G formula based on SCr of 2.44 mg/dL (H)).  Recent Results (from the past 240 hour(s))  Blood Culture (routine x 2)     Status: None (Preliminary result)   Collection Time: 11/07/22 12:13 PM   Specimen: Left Antecubital; Blood  Result Value Ref Range Status   Specimen Description   Final    LEFT ANTECUBITAL BOTTLES DRAWN AEROBIC AND ANAEROBIC Performed at [facility], [address], [place], [place] [address]    Special Requests   Final    Blood Culture adequate volume Performed at [facility], [address], [place], [place] [address]    Culture  Setup Time   Final    GRAM POSITIVE COCCI Gram Stain Report Called to,Read Back By and Verified With: LOPEZ,V @ [id number] ON 11/08/22 BY JUW AEROBIC BOTTLE ONLY GS DONE @ [facility] CRITICAL RESULT CALLED TO, READ BACK BY AND VERIFIED WITH: [place]:[id number] AT 0846 BY EC  ANAB REVIEWED BY A. LAFRANCE Performed at [facility], [place] [address], [place], [place] [address]    Culture GRAM POSITIVE COCCI  Final   Report Status PENDING  Incomplete  Blood Culture (routine x 2)     Status: None (Preliminary result)   Collection Time: 11/07/22 12:13 PM   Specimen: BLOOD LEFT HAND  Result Value Ref Range Status   Specimen Description   Final    BLOOD LEFT HAND BOTTLES DRAWN AEROBIC AND ANAEROBIC Performed at [facility], [address], [place], [place] [address]    Special Requests   Final    Blood Culture adequate volume Performed at [facility],  [address], [place], [place] [address]    Culture  Setup Time   Final    GRAM POSITIVE COCCI Gram Stain Report Called to,Read Back By and Verified With: B.BARNES @ 0841 BY STEPHTR 11/08/22  REVIEWED BY A. LAFRANCE CRITICAL VALUE NOTED.  VALUE IS CONSISTENT WITH PREVIOUSLY REPORTED AND CALLED VALUE. Performed at [facility], [place] [address], [place], [place] [address]    Culture GRAM POSITIVE COCCI  Final   Report Status PENDING  Incomplete  Blood Culture ID Panel (Reflexed)     Status: Abnormal   Collection Time: 11/07/22 12:13 PM  Result Value Ref Range Status   Enterococcus faecalis DETECTED (A) NOT DETECTED Final    Comment: CRITICAL RESULT CALLED TO, READ BACK BY AND VERIFIED WITH: PHARMD CATHY PIERCE :[id number] AT 0846 BY EC    Enterococcus Faecium NOT DETECTED NOT DETECTED Final   Listeria monocytogenes NOT DETECTED NOT DETECTED Final   Staphylococcus species NOT DETECTED NOT DETECTED Final   Staphylococcus aureus (BCID) NOT DETECTED NOT DETECTED Final   Staphylococcus epidermidis NOT DETECTED NOT DETECTED Final   Staphylococcus lugdunensis NOT DETECTED NOT DETECTED Final   Streptococcus species NOT DETECTED NOT  DETECTED Final   Streptococcus agalactiae NOT DETECTED NOT DETECTED Final   Streptococcus pneumoniae NOT DETECTED NOT DETECTED Final   Streptococcus pyogenes NOT DETECTED NOT DETECTED Final   A.calcoaceticus-baumannii NOT DETECTED NOT DETECTED Final   Bacteroides fragilis NOT DETECTED NOT DETECTED Final   Enterobacterales NOT DETECTED NOT DETECTED Final   Enterobacter cloacae complex NOT DETECTED NOT DETECTED Final   Escherichia coli NOT DETECTED NOT DETECTED Final   Klebsiella aerogenes NOT DETECTED NOT DETECTED Final   Klebsiella oxytoca NOT DETECTED NOT DETECTED Final   Klebsiella pneumoniae NOT DETECTED NOT DETECTED Final   Proteus species NOT DETECTED NOT DETECTED Final   Salmonella species NOT DETECTED NOT DETECTED Final   Serratia marcescens NOT DETECTED NOT  DETECTED Final   Haemophilus influenzae NOT DETECTED NOT DETECTED Final   Neisseria meningitidis NOT DETECTED NOT DETECTED Final   Pseudomonas aeruginosa NOT DETECTED NOT DETECTED Final   Stenotrophomonas maltophilia NOT DETECTED NOT DETECTED Final   Candida albicans NOT DETECTED NOT DETECTED Final   Candida auris NOT DETECTED NOT DETECTED Final   Candida glabrata NOT DETECTED NOT DETECTED Final   Candida krusei NOT DETECTED NOT DETECTED Final   Candida parapsilosis NOT DETECTED NOT DETECTED Final   Candida tropicalis NOT DETECTED NOT DETECTED Final   Cryptococcus neoformans/gattii NOT DETECTED NOT DETECTED Final   Vancomycin resistance NOT DETECTED NOT DETECTED Final    Comment: Performed at [facility], [address], [place], [place] [address]  Resp panel by RT-PCR (RSV, Flu A&B, Covid) Anterior Nasal Swab     Status: None   Collection Time: 11/07/22  1:00 PM   Specimen: Anterior Nasal Swab  Result Value Ref Range Status   SARS Coronavirus 2 by RT PCR NEGATIVE NEGATIVE Final    Comment: (NOTE) SARS-CoV-2 target nucleic acids are NOT DETECTED.  The SARS-CoV-2 RNA is generally detectable in upper respiratory specimens during the acute phase of infection. The lowest concentration of SARS-CoV-2 viral copies this assay can detect is 138 copies/mL. A negative result does not preclude SARS-Cov-2 infection and should not be used as the sole basis for treatment or other patient management decisions. A negative result may occur with  improper specimen collection/handling, submission of specimen other than nasopharyngeal swab, presence of viral mutation(s) within the areas targeted by this assay, and inadequate number of viral copies(<138 copies/mL). A negative result must be combined with clinical observations, patient history, and epidemiological information. The expected result is Negative.  Fact Sheet for Patients:  EntrepreneurPulse.com.au  Fact Sheet for  Healthcare Providers:  IncredibleEmployment.be  This test is no t yet approved or cleared by the [place] FDA and  has been authorized for detection and/or diagnosis of SARS-CoV-2 by FDA under an Emergency Use Authorization (EUA). This EUA will remain  in effect (meaning this test can be used) for the duration of the COVID-19 declaration under Section 564(b)(1) of the Act, 21 U.S.C.section 360bbb-3(b)(1), unless the authorization is terminated  or revoked sooner.       Influenza A by PCR NEGATIVE NEGATIVE Final   Influenza B by PCR NEGATIVE NEGATIVE Final    Comment: (NOTE) The Xpert Xpress SARS-CoV-2/FLU/RSV plus assay is intended as an aid in the diagnosis of influenza from Nasopharyngeal swab specimens and should not be used as a sole basis for treatment. Nasal washings and aspirates are unacceptable for Xpert Xpress SARS-CoV-2/FLU/RSV testing.  Fact Sheet for Patients: EntrepreneurPulse.com.au  Fact Sheet for Healthcare Providers: IncredibleEmployment.be  This test is not yet approved  or cleared by the [place] and has been authorized for detection and/or diagnosis of SARS-CoV-2 by FDA under an Emergency Use Authorization (EUA). This EUA will remain in effect (meaning this test can be used) for the duration of the COVID-19 declaration under Section 564(b)(1) of the Act, 21 U.S.C. section 360bbb-3(b)(1), unless the authorization is terminated or revoked.     Resp Syncytial Virus by PCR NEGATIVE NEGATIVE Final    Comment: (NOTE) Fact Sheet for Patients: EntrepreneurPulse.com.au  Fact Sheet for Healthcare Providers: IncredibleEmployment.be  This test is not yet approved or cleared by the [place] FDA and has been authorized for detection and/or diagnosis of SARS-CoV-2 by FDA under an Emergency Use Authorization (EUA). This EUA will remain in effect (meaning this  test can be used) for the duration of the COVID-19 declaration under Section 564(b)(1) of the Act, 21 U.S.C. section 360bbb-3(b)(1), unless the authorization is terminated or revoked.  Performed at [facility], [address] [place], [place] [address]       Radiology Studies: CT ABDOMEN PELVIS WO CONTRAST  Result Date: 11/07/2022 CLINICAL DATA:  Abdominal pain, acute, nonlocalized. Fever with weakness and dizziness for 3 days. Jaundice. History of intrahepatic cholangiocarcinoma. * Tracking Code: BO * EXAM: CT ABDOMEN AND PELVIS WITHOUT CONTRAST TECHNIQUE: Multidetector CT imaging of the abdomen and pelvis was performed following the standard protocol without IV contrast. RADIATION DOSE REDUCTION: This exam was performed according to the departmental dose-optimization program which includes automated exposure control, adjustment of the mA and/or kV according to patient size and/or use of iterative reconstruction technique. COMPARISON:  Abdominopelvic CT 08/28/2022. Abdominal MRI 10/19/2022. FINDINGS: Lower chest: 4 mm right middle lobe nodule on image 4/4 is unchanged from the prior chest CT. There is central airway thickening at both lung bases with mild dependent right lower lobe atelectasis. No consolidation or enlarging nodule identified. Aortic and coronary artery atherosclerosis noted. 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 Hepatobiliary: Previous embolization procedure for intrahepatic cholangiocarcinoma noted centrally in the liver, with associated beam hardening artifact. No recurrent focal mass lesion identified. Previously demonstrated known large gallstone is not well visualized. The gallbladder appears unchanged with mild wall thickening. A metallic biliary stent is in place with interval placement of a plastic biliary stent extending superiorly into the hepatic hilum and inferiorly into the ampulla. There is no pneumobilia, and the  intrahepatic biliary dilatation appears slightly increased. Pancreas: Unremarkable. No pancreatic ductal dilatation or surrounding inflammatory changes. Spleen: Normal in size without focal abnormality. Adrenals/Urinary Tract: Both adrenal glands appear normal. No evidence of renal or ureteral calculus, hydronephrosis or perinephric soft tissue stranding. Both kidneys appear unremarkable. A calcified bladder calculus is again noted. Foley catheter extends into the bladder lumen. The bladder is decompressed. Stomach/Bowel: No enteric contrast administered. The stomach appears unremarkable for its degree of distention. There is possible mild small bowel wall thickening in the mid abdomen, but no significant bowel distension. Mild diverticulosis of the distal colon. The colon and appendix otherwise appear unremarkable. Vascular/Lymphatic: There are no enlarged abdominal or pelvic lymph nodes. Diffuse aortic and branch vessel atherosclerosis with chronically displaced intimal calcifications within the infrarenal abdominal aorta. No focal aneurysm. Reproductive: The prostate gland and seminal vesicles appear unremarkable. Other: Interval increased volume of ascites with mild nodularity throughout the omental and mesenteric fat, suspicious for carcinomatosis. No free intraperitoneal air. Musculoskeletal: No acute or significant osseous findings. Interval increased diffuse subcutaneous edema, asymmetric to the right. Stable mild  lumbar spondylosis. IMPRESSION: 1. Interval increased volume of ascites with mild nodularity throughout the omental and mesenteric fat, suspicious for carcinomatosis. 2. Interval placement of a plastic biliary stent through the metallic wall stent which appears well positioned. However, there is increased intrahepatic biliary dilatation and no pneumobilia, suspicious for stent dysfunction. Correlate with liver function studies. 3. Previous embolization procedure for intrahepatic cholangiocarcinoma.  No recurrent focal mass lesion identified. 4. No evidence of bowel obstruction or perforation. Possible mild small bowel wall thickening in the mid abdomen. 5. Stable 4 mm right middle lobe pulmonary nodule. 6. Stable calcified bladder calculus. 7. Interval increased diffuse subcutaneous edema, asymmetric to the right. 8.  Aortic Atherosclerosis (ICD10-I70.0). Electronically Signed   By: Richardean Sale M.D.   On: 11/07/2022 14:05   DG Chest Port 1 View  Result Date: 11/07/2022 CLINICAL DATA:  Questionable sepsis - evaluate for abnormality EXAM: PORTABLE CHEST - 1 VIEW COMPARISON:  04/09/2022 FINDINGS: Right arm PICC line has been placed to the distal SVC, the right IJ port removed. Lungs are clear. Heart size and mediastinal contours are within normal limits. No effusion. Visualized bones unremarkable. IMPRESSION: No acute cardiopulmonary disease. Electronically Signed   By: Lucrezia Europe M.D.   On: 11/07/2022 12:03      LOS: 1 day    Cordelia Poche, MD [facility] 11/08/2022, 1:59 PM   If 7PM-7AM, please contact night-coverage [email or web address]

## 2022-11-08 NOTE — Progress Notes (Signed)
   11/08/22 0744  Assess: MEWS Score  Temp 98.4 F (36.9 C)  BP (!) 99/56  Pulse Rate (!) 104  ECG Heart Rate (!) 106  Resp 17  Level of Consciousness Alert  SpO2 94 %  O2 Device Room Air  Assess: MEWS Score  MEWS Temp 0  MEWS Systolic 1  MEWS Pulse 1  MEWS RR 0  MEWS LOC 0  MEWS Score 2  MEWS Score Color Yellow  Assess: if the MEWS score is Yellow or Red  Were vital signs taken at a resting state? Yes  Focused Assessment No change from prior assessment  Does the patient meet 2 or more of the SIRS criteria? Yes  Does the patient have a confirmed or suspected source of infection? Yes  Provider and Rapid Response Notified? No  MEWS guidelines implemented  No, previously yellow, continue vital signs every 4 hours  Assess: SIRS CRITERIA  SIRS Temperature  0  SIRS Pulse 1  SIRS Respirations  0  SIRS WBC 1  SIRS Score Sum  2

## 2022-11-08 NOTE — Consult Note (Addendum)
Consultation  Referring Provider:  TRH/ [place] Primary Care Physician:  Redmond School, MD Primary Gastroenterologist:  Dr.Rehman/ Mansouraty  Reason for Consultation: Unresectable cholangiocarcinoma biliary obstruction, recent stent placement, presenting with fever and weakness x 3 days  HPI: Jonathan Keith is a 67 y.o. male known to Dr. Rush Landmark with history of unresectable cholangiocarcinoma.  He also has history of hypertension, diabetes mellitus. He had undergone biliary stenting and a portal vein embolization at the [facility] in [place] in November [id number], course complicated by Pseudomonas/coccus bacteremia.  He had exchange of biliary stents at [facility] in October 2022.  In follow-up at the [facility] in November 2023 and was told unfortunately he could not undergo a hepatectomy as his cholangiocarcinoma had progressed.  They changed the biliary stent to a metal stent at that time.  Dr. Stefani Dama Roddy was consulted earlier this month after his oncologist noted increase in LFTs and total bili.  RI/MRCP at that time showed moderate intrahepatic biliary ductal dilation common bile duct stent, although poorly visualized by MRI unchanged in the central common bile duct, there was a small volume of ascites and a single large gallstone contracted in the gallbladder, severe aortic atherosclerosis. Patient underwent repeat ERCP per Dr. Stefani Dama Roddy on 10/23/2022 with finding of a single severe biliary stricture in the left main hepatic duct, this was malignant appearing, stricture was dilated biliary tree was swept and a plastic biliary stent was placed through the pre-existing metal stent into the left hepatic duct to traverse the stricture.  Plan was to exchange the stent in 3 to 5 months if he did well to a metal stent, if he did not have improvement then likely would need IR intervention.  Patient presented to the emergency room at [facility] yesterday with 3-day history of progressive  generalized weakness and intermittent fevers.  He was also complaining of generalized abdominal pain worse in the right upper abdomen, he had had some nausea and vomiting.  According to notes Tmax was 101.  CT of the abdomen and pelvis was done noting interval increase volume of ascites with mild nodularity throughout the omentum and mesenteric fat suspicion for carcinomatosis, interval placement of plastic biliary stent well-positioned however there was increase in intrahepatic biliary dilation and no pneumobilia raising concern for stent dysfunction Evidence of prior embolization for intrahepatic cholangiocarcinoma Able 4 mm right middle lobe pulmonary nodule stable calcified bladder calculus and interval increase in diffuse subcutaneous edema asymmetric to the right.  Patient has been started on Maxipime, Rocephin and vancomycin  Blood cultures already returning positive for Enterococcus.  Labs on admission WBC of 24.3/hemoglobin 9.3 T. bili 4.0/alk phos 365/AST 92/ALT 59 Lactate 3.3 Pro time 16.2/INR 1.3  Today-WBC 12.6 improved/hemoglobin 7.9/hematocrit 22.1/platelets 79 Sodium 127/potassium 3.8/BUN 36/creatinine 2.44 T. bili 3.3/AST 107/ALT 57/alk phos 278  Temp 100.2 at 4 AM Blood pressure 95/50 pulse 110   Patient says he definitely feels better than he did yesterday in general still having abdominal discomfort but no severe pain no nausea.  No shaking chills.  Patient had undergone paracentesis on 311 2024-3.5 L removed     Past Medical History:  Diagnosis Date   Cancer ([place])    CHF (congestive heart failure) (HCC)    Chronic pain    neck, back, knees   Class 1 obesity due to excess calories with body mass index (BMI) of 31.0 to 31.9 in adult    Claudication [facility])    Coronary artery disease  DDD (degenerative disc disease), cervical    Diabetes mellitus ([place])    Dyslipidemia    Hypertension    Port-A-Cath in place 07/20/2021   Tobacco dependence     Past  Surgical History:  Procedure Laterality Date   BILIARY BRUSHING N/A 02/25/2021   Procedure: BILIARY BRUSHING;  Surgeon: Rogene Houston, MD;  Location: [facility];  Service: Gastroenterology;  Laterality: N/A;   BILIARY DILATION  10/23/2022   Procedure: BILIARY DILATION;  Surgeon: Rush Landmark Telford Nab., MD;  Location: [place];  Service: Gastroenterology;;   BILIARY STENT PLACEMENT N/A 02/25/2021   Procedure: BILIARY STENT PLACEMENT;  Surgeon: Rogene Houston, MD;  Location: [facility];  Service: Gastroenterology;  Laterality: N/A;   BILIARY STENT PLACEMENT  02/27/2021   Procedure: BILIARY STENT PLACEMENT (10FR x 9cm) IN THE RIGHT SYSTEM;  Surgeon: Rogene Houston, MD;  Location: [facility];  Service: Gastroenterology;;   BILIARY STENT PLACEMENT  10/23/2022   Procedure: BILIARY STENT PLACEMENT;  Surgeon: Irving Copas., MD;  Location: [place];  Service: Gastroenterology;;   BREAST SURGERY Left    benign lump- in his 38s.   CARDIAC CATHETERIZATION     ERCP N/A 02/25/2021   Procedure: ENDOSCOPIC RETROGRADE CHOLANGIOPANCREATOGRAPHY (ERCP);  Surgeon: Rogene Houston, MD;  Location: [facility];  Service: Gastroenterology;  Laterality: N/A;   ERCP N/A 02/27/2021   Procedure: ENDOSCOPIC RETROGRADE CHOLANGIOPANCREATOGRAPHY (ERCP);  Surgeon: Rogene Houston, MD;  Location: [facility];  Service: Gastroenterology;  Laterality: N/A;   ERCP N/A 10/23/2022   Procedure: ENDOSCOPIC RETROGRADE CHOLANGIOPANCREATOGRAPHY (ERCP);  Surgeon: Irving Copas., MD;  Location: [place];  Service: Gastroenterology;  Laterality: N/A;   IR IMAGING GUIDED PORT INSERTION  07/21/2021   IR REMOVAL TUN ACCESS W/ PORT W/O FL MOD SED  09/30/2022   KNEE SURGERY Left    x2   LEFT HEART CATH AND CORONARY ANGIOGRAPHY N/A 09/08/2021   Procedure: LEFT HEART CATH AND CORONARY ANGIOGRAPHY;  Surgeon: Early Osmond, MD;  Location: [place] CV [facility];  Service: Cardiovascular;  Laterality: N/A;   REMOVAL OF STONES   10/23/2022   Procedure: REMOVAL OF STONES;  Surgeon: Rush Landmark Telford Nab., MD;  Location: [place];  Service: Gastroenterology;;   SPHINCTEROTOMY N/A 02/25/2021   Procedure: Joan Mayans;  Surgeon: Rogene Houston, MD;  Location: [facility];  Service: Gastroenterology;  Laterality: N/A;   STENT REMOVAL  02/27/2021   Procedure: STENT REMOVAL (8.5Fr x 9cm);  Surgeon: Rogene Houston, MD;  Location: [facility];  Service: Gastroenterology;;    Prior to Admission medications   Medication Sig Start Date End Date Taking? Authorizing Provider  acetaminophen (TYLENOL) 500 MG tablet Take 500 mg by mouth every 6 (six) hours as needed for moderate pain or headache.   Yes [provider]  albuterol (VENTOLIN HFA) 108 (90 Base) MCG/ACT inhaler Inhale 2 puffs into the lungs every 6 (six) hours as needed for wheezing or shortness of breath. 11/29/21  Yes Tat, Shanon Brow, MD  alfuzosin (UROXATRAL) 10 MG 24 hr tablet Take 1 tablet (10 mg total) by mouth at bedtime. 08/21/22  Yes McKenzie, Candee Furbish, MD  atorvastatin (LIPITOR) 40 MG tablet Take 1 tablet (40 mg total) by mouth daily at 6 PM. 12/05/21  Yes O'Neal, Cassie Freer, MD  carvedilol (COREG) 3.125 MG tablet Take 1 tablet (3.125 mg total) by mouth 2 (two) times daily with a meal. 10/25/22 11/24/22 Yes Pahwani, Einar Grad, MD  CISplatin (PLATINOL) 50 MG/50ML SOLN Inject 50 mg into the vein  once a week. On days 1 and 8, every 21 days   Yes [provider]  durvalumab 1,500 mg in sodium chloride 0.9 % 100 mL Inject 1,500 mg into the vein every 21 ( twenty-one) days.   Yes [provider]  famotidine (PEPCID) 20 MG tablet Take 20 mg by mouth 2 (two) times daily.   Yes [provider]  ferrous sulfate (FERROUSUL) 325 (65 FE) MG tablet Take 1 tablet (325 mg total) by mouth daily with breakfast. 03/18/21  Yes Rehman, Mechele Dawley, MD  furosemide (LASIX) 20 MG tablet Take 20 mg by mouth as needed for fluid or edema. If he gains weight five pounds when  taking chemo, take one tab a day till he is back to normal weight or within 2 pounds for fluid check before surgery.   Yes [provider]  gemcitabine 760 mg/m2 in sodium chloride 0.9 % 100 mL Inject 760 mg/m2 into the vein once a week. On days 1 and 8 every 21 days   Yes [provider]  guaifenesin (ROBITUSSIN) 100 MG/5ML syrup Take 200 mg by mouth 3 (three) times daily as needed for cough.   Yes [provider]  lisinopril (ZESTRIL) 2.5 MG tablet Take 1 tablet (2.5 mg total) by mouth daily. 09/11/22 09/06/23 Yes Mallipeddi, Vishnu P, MD  metFORMIN (GLUCOPHAGE) 500 MG tablet Take 500 mg by mouth 2 (two) times daily with a meal.   Yes [provider]  Misc. Devices KIT 1 Box by Does not apply route every 7 (seven) days. 10/20/22  Yes Derek Jack, MD  nitroGLYCERIN (NITROSTAT) 0.4 MG SL tablet Place 1 tablet (0.4 mg total) under the tongue every 5 (five) minutes as needed for chest pain. 09/12/21 11/07/22 Yes Duke, Tami Lin, PA  pantoprazole (PROTONIX) 40 MG tablet Take 1 tablet (40 mg total) by mouth daily. 12/05/21  Yes O'Neal, Cassie Freer, MD  prochlorperazine (COMPAZINE) 10 MG tablet TAKE (1) TABLET BY MOUTH EVERY (6) HOURS AS NEEDED. Patient taking differently: Take 10 mg by mouth every 6 (six) hours as needed for nausea or vomiting. TAKE (1) TABLET BY MOUTH EVERY (6) HOURS AS NEEDED. 09/28/22  Yes Derek Jack, MD  sodium chloride flush (NS) 0.9 % SOLN 10 mLs by Intracatheter route every 7 (seven) days. Patient taking differently: 10 mLs by Intracatheter route daily. 10/20/22  Yes Derek Jack, MD  Wound Dressings (INTRASITE GEL APPLIPAK) GEL Apply 1 Units topically daily. 10/05/22   Derek Jack, MD    Current Facility-Administered Medications  Medication Dose Route Frequency Provider Last Rate Last Admin   albumin human 25 % solution 25 g  25 g Intravenous Once Bernadette Hoit, DO   Held at 11/07/22 1958   cefTRIAXone  (ROCEPHIN) 2 g in sodium chloride 0.9 % 100 mL IVPB  2 g Intravenous Q24H Adefeso, Oladapo, DO       Chlorhexidine Gluconate Cloth 2 % PADS 6 each  6 each Topical Daily Adefeso, Oladapo, DO   6 each at 11/07/22 2245   [START ON 11/09/2022] feeding supplement (ENSURE ENLIVE / ENSURE PLUS) liquid 237 mL  237 mL Oral TID BM Mariel Aloe, MD       lactated ringers bolus 1,000 mL  1,000 mL Intravenous Once Wynona Dove A, DO       multivitamin with minerals tablet 1 tablet  1 tablet Oral Daily Mariel Aloe, MD   1 tablet at 11/08/22 0954   ondansetron (ZOFRAN) tablet 4 mg  4  mg Oral Q6H PRN Bernadette Hoit, DO       Or   ondansetron (ZOFRAN) injection 4 mg  4 mg Intravenous Q6H PRN Adefeso, Oladapo, DO       pantoprazole (PROTONIX) injection 40 mg  40 mg Intravenous Q24H Adefeso, Oladapo, DO   40 mg at 11/07/22 2028   sodium chloride flush (NS) 0.9 % injection 10-40 mL  10-40 mL Intracatheter Q12H Adefeso, Oladapo, DO   10 mL at 11/08/22 0846   vancomycin (VANCOREADY) IVPB 750 mg/150 mL  750 mg Intravenous Q24H Cristie Hem, MD        Allergies as of 11/07/2022   (No Known Allergies)    Family History  Problem Relation Age of Onset   CAD Mother    Cancer Neg Hx     Social History   Socioeconomic History   Marital status: Married    Spouse name: Not on file   Number of children: Not on file   Years of education: Not on file   Highest education level: Not on file  Occupational History   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  Tobacco Use   Smoking status: Every Day    Packs/day: 0.50    Years: 56.00    Additional pack years: 0.00    Total pack years: 28.00    Types: Cigarettes   Smokeless tobacco: Never  Vaping Use   Vaping Use: Never used  Substance and Sexual Activity   Alcohol use: Not Currently    Comment: stopped 2.5 years ago 03/11/21   Drug use: Never   Sexual activity: Not on file  Other Topics Concern   Not on file  Social History Narrative   Not on file    Social Determinants of Health   Financial Resource Strain: Medium Risk (03/07/2021)   Overall Financial Resource Strain (CARDIA)    Difficulty of Paying Living Expenses: Somewhat hard  Food Insecurity: No Food Insecurity (10/22/2022)   Hunger Vital Sign    Worried About Running Out of Food in the Last Year: Never true    Ran Out of Food in the Last Year: Never true  Transportation Needs: No Transportation Needs (10/22/2022)   PRAPARE - Hydrologist (Medical): No    Lack of Transportation (Non-Medical): No  Physical Activity: Insufficiently Active (03/07/2021)   Exercise Vital Sign    Days of Exercise per Week: 2 days    Minutes of Exercise per Session: 20 min  Stress: No Stress Concern Present (03/07/2021)   [place]    Feeling of Stress : Only a little  Social Connections: Moderately Integrated (03/07/2021)   Social Connection and Isolation Panel [NHANES]    Frequency of Communication with Friends and Family: More than three times a week    Frequency of Social Gatherings with Friends and Family: More than three times a week    Attends Religious Services: More than 4 times per year    Active Member of [employer] or Organizations: No    Attends Archivist Meetings: Never    Marital Status: Married  Human resources officer Violence: Not At Risk (10/22/2022)   Humiliation, Afraid, Rape, and Kick questionnaire    Fear of Current or Ex-Partner: No    Emotionally Abused: No    Physically Abused: No    Sexually Abused: No    Review of Systems: Pertinent positive and negative review of systems were noted in the above HPI section.  All other review of systems was otherwise negative.   Physical Exam: Vital signs in last 24 hours: Temp:  [98.4 F (36.9 C)-100.2 F (37.9 C)] 98.9 F (37.2 C) (03/31 0803) Pulse Rate:  [97-122] 100 (03/31 0803) Resp:  [17-20] 17 (03/31 0803) BP:  (86-133)/(52-76) 91/55 (03/31 0803) SpO2:  [87 %-98 %] 92 % (03/31 0803) Weight:  [75.8 kg] 75.8 kg (03/30 1203) Last BM Date : 11/06/22 General:   Alert,  Well-developed, well-nourished, chronically ill-appearing white male pleasant and cooperative in NAD  -blood pressure 91/50/pulse 100 Head:  Normocephalic and atraumatic. Eyes:  Sclera early icterus, no icterus.   Conjunctiva pink. Ears:  Normal auditory acuity. Nose:  No deformity, discharge,  or lesions. Mouth:  No deformity or lesions.   Neck:  Supple; no masses or thyromegaly. Lungs:  Clear throughout to auscultation.   No wheezes, crackles, or rhonchi.  Heart:  Regular rate and rhythm; no murmurs, clicks, rubs,  or gallops. Abdomen:  Soft, he is tender in the upper abdomen, no rebound no palpable mass, BS active, abdomen  full feeling diffusely-no diffuse tenderness Rectal: Not done Msk:  Symmetrical without gross deformities. . Pulses:  Normal pulses noted. Extremities:  Without clubbing or edema. Neurologic:  Alert and  oriented x4;  grossly normal neurologically. Skin:  Intact without significant lesions or rashes.. Psych:  Alert and cooperative. Normal mood and affect.  Intake/Output from previous day: 03/30 0701 - 03/31 0700 In: 296.8 [IV Piggyback:296.8] Out: 700 [Urine:700] Intake/Output this shift: Total I/O In: 120 [P.O.:120] Out: -   Lab Results: Recent Labs    11/07/22 1213 11/08/22 0357  WBC 24.3* 12.6*  HGB 9.3*  9.2* 7.9*  HCT 27.2*  27.0* 22.1*  PLT 92* 79*   BMET Recent Labs    11/07/22 1213 11/08/22 0357  NA 124*  128* 127*  K 4.5  4.6 3.8  CL 96*  96* 97*  CO2 20* 19*  GLUCOSE 161*  155* 98  BUN 37*  32* 36*  CREATININE 2.51*  2.90* 2.44*  CALCIUM 7.9* 7.7*   LFT Recent Labs    11/08/22 0357  PROT 4.8*  ALBUMIN 1.5*  AST 107*  ALT 57*  ALKPHOS 278*  BILITOT 3.3*   PT/INR Recent Labs    11/07/22 1213  LABPROT 16.2*  INR 1.3*   Hepatitis Panel No results for  input(s): "HEPBSAG", "HCVAB", "HEPAIGM", "HEPBIGM" in the last 72 hours.    IMPRESSION:  #72 67 year old white male with unresectable cholangiocarcinoma, status post prior coil embolization November 2022 and initial biliary stenting done at that time/[facility]. He has had exchange of biliary stents in October 2022, and then again in November 2023 at which time a metal stent was [facility] at the [facility].  He developed recurrent biliary obstruction earlier this month, and underwent ERCP, with finding of a severe biliary stricture in the left main hepatic duct the stricture was dilated, then with placement of a plastic biliary stent inside the existing metal stent, into the left hepatic duct to traverse the stricture It was noted that patient probably just has the left hepatic system at this time unable to visualize the right hepatic system  Patient represented yesterday with 3-day history of progressive weakness and intermittent fevers to 101.  Diagnosed with sepsis/bacteremia, elevated lactate, WBC of 24.3 febrile.  Imaging shows what appears to be a nonfunctioning/dysfunctional plastic biliary stent in good position within the metal stent but without any evidence of pneumobilia.  Also noted to  have ascites and increased nodularity in the mesentery concerning for pneumatosis.  Patient currently on vancomycin Maxipime and Rocephin he is clinically improved, white count down to 12.6, LFTs relatively stable  Blood cultures positive for Enterococcus    PLAN: Patient did eat some breakfast/fruit this morning He will need ERCP, with removal of the plastic biliary stent, and cleanout of the bile duct.  Dr. Stefani Dama Roddy does not feel that placing another plastic stent will benefit him much as he did not have any good benefit from this last stent.  Will need IR involvement, for possible additional percutaneous biliary drainage  Continue broad-spectrum antibiotics with Enterococcus coverage-has  been changed to Unasyn  Patient is also scheduled for paracentesis today will need cell counts, cytology culture  Will keep him n.p.o. in a.m. tomorrow, and plan to get him scheduled for ERCP with stent removal with Dr. Jethro Bastos PM.  Plans will be discussed in detail with the patient and his wife.   Summit Borchardt PA-C  11/08/2022, 10:04 AM

## 2022-11-09 ENCOUNTER — Inpatient Hospital Stay (HOSPITAL_COMMUNITY): Payer: Medicare Other | Admitting: Anesthesiology

## 2022-11-09 ENCOUNTER — Inpatient Hospital Stay (HOSPITAL_COMMUNITY): Payer: Medicare Other

## 2022-11-09 ENCOUNTER — Encounter (HOSPITAL_COMMUNITY)
Admission: EM | Disposition: A | Discharge status: Home Health | Payer: Self-pay | Source: Home / Self Care | Attending: Internal Medicine

## 2022-11-09 ENCOUNTER — Encounter (HOSPITAL_COMMUNITY): Payer: Self-pay | Admitting: Internal Medicine

## 2022-11-09 DIAGNOSIS — A419 Sepsis, unspecified organism: Secondary | ICD-10-CM

## 2022-11-09 DIAGNOSIS — Z4659 Encounter for fitting and adjustment of other gastrointestinal appliance and device: Secondary | ICD-10-CM | POA: Diagnosis not present

## 2022-11-09 DIAGNOSIS — I255 Ischemic cardiomyopathy: Secondary | ICD-10-CM | POA: Diagnosis not present

## 2022-11-09 DIAGNOSIS — E871 Hypo-osmolality and hyponatremia: Secondary | ICD-10-CM | POA: Diagnosis not present

## 2022-11-09 DIAGNOSIS — I252 Old myocardial infarction: Secondary | ICD-10-CM

## 2022-11-09 DIAGNOSIS — K831 Obstruction of bile duct: Secondary | ICD-10-CM | POA: Diagnosis not present

## 2022-11-09 DIAGNOSIS — F17210 Nicotine dependence, cigarettes, uncomplicated: Secondary | ICD-10-CM

## 2022-11-09 DIAGNOSIS — C221 Intrahepatic bile duct carcinoma: Secondary | ICD-10-CM | POA: Diagnosis not present

## 2022-11-09 DIAGNOSIS — I2510 Atherosclerotic heart disease of native coronary artery without angina pectoris: Secondary | ICD-10-CM

## 2022-11-09 DIAGNOSIS — I10 Essential (primary) hypertension: Secondary | ICD-10-CM

## 2022-11-09 DIAGNOSIS — C7889 Secondary malignant neoplasm of other digestive organs: Secondary | ICD-10-CM | POA: Diagnosis not present

## 2022-11-09 HISTORY — PX: ERCP: SHX5425

## 2022-11-09 HISTORY — PX: REMOVAL OF STONES: SHX5545

## 2022-11-09 HISTORY — PX: STENT REMOVAL: SHX6421

## 2022-11-09 LAB — COMPREHENSIVE METABOLIC PANEL
ALT: 62 U/L — ABNORMAL HIGH (ref 0–44)
AST: 108 U/L — ABNORMAL HIGH (ref 15–41)
Albumin: <1.5 g/dL — ABNORMAL LOW (ref 3.5–5.0)
Alkaline Phosphatase: 298 U/L — ABNORMAL HIGH (ref 38–126)
Anion gap: 9 (ref 5–15)
BUN: 34 mg/dL — ABNORMAL HIGH (ref 8–23)
CO2: 21 mmol/L — ABNORMAL LOW (ref 22–32)
Calcium: 7.7 mg/dL — ABNORMAL LOW (ref 8.9–10.3)
Chloride: 99 mmol/L (ref 98–111)
Creatinine, Ser: 2.28 mg/dL — ABNORMAL HIGH (ref 0.61–1.24)
GFR, Estimated: 31 mL/min — ABNORMAL LOW (ref >60)
Glucose, Bld: 153 mg/dL — ABNORMAL HIGH (ref 70–99)
Potassium: 3.6 mmol/L (ref 3.5–5.1)
Sodium: 129 mmol/L — ABNORMAL LOW (ref 135–145)
Total Bilirubin: 2.6 mg/dL — ABNORMAL HIGH (ref 0.3–1.2)
Total Protein: 4.9 g/dL — ABNORMAL LOW (ref 6.5–8.1)

## 2022-11-09 LAB — CBC
HCT: 23.8 % — ABNORMAL LOW (ref 39.0–52.0)
Hemoglobin: 8.3 g/dL — ABNORMAL LOW (ref 13.0–17.0)
MCH: 33.3 pg (ref 26.0–34.0)
MCHC: 34.9 g/dL (ref 30.0–36.0)
MCV: 95.6 fL (ref 80.0–100.0)
Platelets: 74 10*3/uL — ABNORMAL LOW (ref 150–400)
RBC: 2.49 MIL/uL — ABNORMAL LOW (ref 4.22–5.81)
RDW: 16.7 % — ABNORMAL HIGH (ref 11.5–15.5)
WBC: 7.6 10*3/uL (ref 4.0–10.5)
nRBC: 0 % (ref 0.0–0.2)

## 2022-11-09 LAB — SURGICAL PCR SCREEN
MRSA, PCR: NEGATIVE
Staphylococcus aureus: NEGATIVE

## 2022-11-09 SURGERY — ERCP, WITH INTERVENTION IF INDICATED
Anesthesia: General

## 2022-11-09 MED ORDER — FENTANYL CITRATE (PF) 250 MCG/5ML IJ SOLN
INTRAMUSCULAR | Status: DC | PRN
  Administered 2022-11-09: 50 ug via INTRAVENOUS

## 2022-11-09 MED ORDER — SUGAMMADEX SODIUM 200 MG/2ML IV SOLN
INTRAVENOUS | Status: DC | PRN
  Administered 2022-11-09: 300 mg via INTRAVENOUS

## 2022-11-09 MED ORDER — DEXAMETHASONE SODIUM PHOSPHATE 10 MG/ML IJ SOLN
INTRAMUSCULAR | Status: DC | PRN
  Administered 2022-11-09: 5 mg via INTRAVENOUS

## 2022-11-09 MED ORDER — PROPOFOL 10 MG/ML IV BOLUS
INTRAVENOUS | Status: DC | PRN
  Administered 2022-11-09: 140 mg via INTRAVENOUS

## 2022-11-09 MED ORDER — LACTATED RINGERS IV SOLN: INTRAVENOUS | Status: DC | PRN

## 2022-11-09 MED ORDER — PHENYLEPHRINE HCL-NACL 20-0.9 MG/250ML-% IV SOLN
INTRAVENOUS | Status: DC | PRN
  Administered 2022-11-09: 100 ug/min via INTRAVENOUS

## 2022-11-09 MED ORDER — SODIUM CHLORIDE 0.9 % IV SOLN
INTRAVENOUS | Status: DC | PRN
  Administered 2022-11-09: 21 mL

## 2022-11-09 MED ORDER — DICLOFENAC SUPPOSITORY 100 MG
100.0000 mg | Freq: Once | RECTAL | Status: DC
  Filled 2022-11-09: qty 1

## 2022-11-09 MED ORDER — GLUCAGON HCL RDNA (DIAGNOSTIC) 1 MG IJ SOLR
INTRAMUSCULAR | Status: DC | PRN
  Administered 2022-11-09 (×2): .25 mg via INTRAVENOUS

## 2022-11-09 MED ORDER — PHENYLEPHRINE HCL (PRESSORS) 10 MG/ML IV SOLN
INTRAVENOUS | Status: DC | PRN
  Administered 2022-11-09: 80 ug via INTRAVENOUS

## 2022-11-09 MED ORDER — GLUCAGON HCL RDNA (DIAGNOSTIC) 1 MG IJ SOLR
INTRAMUSCULAR | Status: AC
  Filled 2022-11-09: qty 1

## 2022-11-09 MED ORDER — ONDANSETRON HCL 4 MG/2ML IJ SOLN
INTRAMUSCULAR | Status: DC | PRN
  Administered 2022-11-09: 4 mg via INTRAVENOUS

## 2022-11-09 MED ORDER — DICLOFENAC SUPPOSITORY 100 MG
RECTAL | Status: AC
  Filled 2022-11-09: qty 1

## 2022-11-09 MED ORDER — DICLOFENAC SUPPOSITORY 100 MG
RECTAL | Status: DC | PRN
  Administered 2022-11-09: 100 mg via RECTAL

## 2022-11-09 MED ORDER — ROCURONIUM BROMIDE 10 MG/ML (PF) SYRINGE
PREFILLED_SYRINGE | INTRAVENOUS | Status: DC | PRN
  Administered 2022-11-09: 35 mg via INTRAVENOUS
  Administered 2022-11-09: 10 mg via INTRAVENOUS

## 2022-11-09 MED ORDER — LACTATED RINGERS IV SOLN: INTRAVENOUS | Status: DC

## 2022-11-09 MED ORDER — FENTANYL CITRATE (PF) 100 MCG/2ML IJ SOLN
INTRAMUSCULAR | Status: AC
  Filled 2022-11-09: qty 2

## 2022-11-09 NOTE — Transfer of Care (Signed)
Immediate Anesthesia Transfer of Care Note  Patient: Jonathan Keith  Procedure(s) Performed: ENDOSCOPIC RETROGRADE CHOLANGIOPANCREATOGRAPHY (ERCP) STENT REMOVAL REMOVAL OF STONES  Patient Location: PACU  Anesthesia Type:General  Level of Consciousness: awake, alert , and oriented  Airway & Oxygen Therapy: Patient Spontanous Breathing  Post-op Assessment: Post -op Vital signs reviewed and stable  Post vital signs: stable  Last Vitals:  Vitals Value Taken Time  BP    Temp    Pulse    Resp    SpO2      Last Pain:  Vitals:   11/09/22 1207  TempSrc: Temporal  PainSc: 0-No pain         Complications: No notable events documented.

## 2022-11-09 NOTE — Progress Notes (Signed)
[facility] for Infectious Disease    Date of Admission:  11/07/2022   Total days of antibiotics 3/day 2 of amp/sub   ID: Jonathan Keith is a 67 y.o. male with   Principal Problem:   Metastatic cholangiocarcinoma to bile duct Active Problems:   Mixed hyperlipidemia   Transaminitis   Leukocytosis   Hyponatremia   GERD (gastroesophageal reflux disease)   Hypoalbuminemia due to protein-calorie malnutrition   Sepsis   Hyperbilirubinemia   Dehydration   Nausea and vomiting   Ischemic cardiomyopathy   Acute kidney injury superimposed on chronic kidney disease   Biliary obstruction   Lactic acidosis   Prediabetes   Malignant ascites   Abdominal distention   Gram-negative bacteremia   Cholangitis   Bile duct stricture    Subjective: Thirsty after ERCP procedure today, that went well  Medications:   Chlorhexidine Gluconate Cloth  6 each Topical Daily   diclofenac  100 mg Rectal Once   feeding supplement  237 mL Oral TID BM   multivitamin with minerals  1 tablet Oral Daily   pantoprazole (PROTONIX) IV  40 mg Intravenous Q24H   sodium chloride flush  10-40 mL Intracatheter Q12H    Objective: Vital signs in last 24 hours: Temp:  [97.3 F (36.3 C)-98.6 F (37 C)] 97.3 F (36.3 C) (04/01 1540) Pulse Rate:  [78-97] 78 (04/01 1540) Resp:  [12-17] 17 (04/01 1540) BP: (95-109)/(59-66) 97/61 (04/01 1540) SpO2:  [2 %-99 %] 96 % (04/01 1540) Weight:  [75.8 kg] 75.8 kg (04/01 1207)  Physical Exam  Constitutional: He is oriented to person, place, and time. He appears well-developed and well-nourished. No distress.  HENT:  Mouth/Throat: Oropharynx is clear and moist. No oropharyngeal exudate.  Cardiovascular: Normal rate, regular rhythm and normal heart sounds. Exam reveals no gallop and no friction rub.  No murmur heard.  Pulmonary/Chest: Effort normal and breath sounds normal. No respiratory distress. He has no wheezes.  Abdominal: Soft. Bowel sounds are normal. He  exhibits no distension. There is no tenderness.  Lymphadenopathy:  He has no cervical adenopathy.  Neurological: He is alert and oriented to person, place, and time.  Skin: Skin is warm and dry. No rash noted. No erythema.  Psychiatric: He has a normal mood and affect. His behavior is normal.    Lab Results Recent Labs    11/08/22 0357 11/09/22 0343  WBC 12.6* 7.6  HGB 7.9* 8.3*  HCT 22.1* 23.8*  NA 127* 129*  K 3.8 3.6  CL 97* 99  CO2 19* 21*  BUN 36* 34*  CREATININE 2.44* 2.28*   Liver Panel Recent Labs    11/08/22 0357 11/09/22 0343  PROT 4.8* 4.9*  ALBUMIN 1.5* <1.5*  AST 107* 108*  ALT 57* 62*  ALKPHOS 278* 298*  BILITOT 3.3* 2.6*   Sedimentation Rate No results for input(s): "ESRSEDRATE" in the last 72 hours. C-Reactive Protein No results for input(s): "CRP" in the last 72 hours.  Microbiology: Blood cx - e.faecalis Studies/Results: DG ERCP  Result Date: 11/09/2022 CLINICAL DATA:  ERCP EXAM: ERCP TECHNIQUE: Multiple spot images obtained with the fluoroscopic device and submitted for interpretation post-procedure. FLUOROSCOPY: Refer to separate report COMPARISON:  None Available. FINDINGS: A total of 13 fluoroscopic spot images taken during ERCP are submitted for review. Initial images demonstrate a scope overlying the upper abdomen. A plastic biliary stent appears to course through a metallic biliary stent. The plastic biliary stent is removed. Wire catheterization of the common bile  duct is performed. Contrast injection opacifies the intrahepatic and extrahepatic biliary tree. The intrahepatic biliary ducts appear normal in caliber. Balloon sweeps are performed through the common bile duct. Final images demonstrate decompression of the intrahepatic biliary tree with passage of contrast material into the small bowel. IMPRESSION: ERCP images as described. Refer to separate procedure report for full details. These images were submitted for radiologic interpretation  only. Please see the procedural report for the amount of contrast and the fluoroscopy time utilized. Electronically Signed   By: Albin Felling M.D.   On: 11/09/2022 14:54   US Paracentesis  Result Date: 11/08/2022 INDICATION: Cholangiocarcinoma with small volume ascites request for therapeutic paracentesis. EXAM: ULTRASOUND GUIDED PARACENTESIS MEDICATIONS: 1% lidocaine 10 mL COMPLICATIONS: None immediate. PROCEDURE: Informed written consent was obtained from the patient after a discussion of the risks, benefits and alternatives to treatment. A timeout was performed prior to the initiation of the procedure. Initial ultrasound scanning demonstrates a small amount of ascites within the right upper abdomen. The right upper abdomen was prepped and draped in the usual sterile fashion. 1% lidocaine was used for local anesthesia. Following this, a 19 gauge, 7-cm, Yueh catheter was introduced. An ultrasound image was saved for documentation purposes. The paracentesis was performed. The catheter was removed and a dressing was applied. The patient tolerated the procedure well without immediate post procedural complication. Scratch FINDINGS: A total of approximately 1.2 L of clear yellow fluid was removed. Samples were sent to the laboratory as requested by the clinical team. IMPRESSION: Successful ultrasound-guided paracentesis yielding 1.2 liters of peritoneal fluid. Procedure performed by: Gareth Eagle, PA-C Electronically Signed   By: Aletta Edouard M.D.   On: 11/08/2022 14:08     Assessment/Plan: E.faecalis bacteremia = continue on amp/sub for the time being.  Please get TTE to evaluate heart valves but due to acute nature of onset, and recovery, unlikely to need TEE.  Cholangitis = getting ex. Biliary drain tomorrow per IR  Leukocytosis = improving since being treated with iv abtx and getting source control tomorrow.  [facility] for Infectious Diseases Pager:  [phone]  11/09/2022, 4:27 PM

## 2022-11-09 NOTE — Progress Notes (Addendum)
PROGRESS NOTE    Jonathan Keith  [id number] DOB: 1956/04/25 DOA: 11/07/2022 PCP: Redmond School, MD   Brief Narrative: Jonathan Keith is a 67 y.o. male with a history of metastatic cholangiocarcinoma with biliary obstruction s/p stent placement, hyperlipidemia, CAD, ischemic cardiomyopathy, CHF, AKI on CKD stage IV, anemia of chronic disease. Patient presented secondary to fever and weakness with evidence of sepsis presumed secondary to biliary obstruction and SBP. Workup revealed associated enterococcus bacteremia. GI and ID consulted. Plan for percutaneous bile duct drain per IR and ERCP for removal of plastic biliary stent. Patient managed on antibiotics for bacteremia and SBP prophylaxis.   Assessment and Plan:  Sepsis Secondary to biliary obstruction and bacteremia. Concern for possible SBP. ID consulted and have deescalated antibiotics to Unasyn monotherapy.  Biliary obstruction Patient with known biliary obstruction secondary to known cholangiocarcinoma with recent biliary stent placement. Concern for biliary stent malfunction. GI consulted with plan for percutaneous bile duct drain (4/2) per IR and ERCP (4/1) for removal of plastic biliary stent.  Enterococcus faecalis bacteremia ID consulted. Patient transitioned to Unasyn. -ID recommendations: Unasyn, repeat blood cultures (4/1), Transthoracic Echocardiogram  Malignant ascites Metastatic cholangiocarcinoma Associated concern for SBP on admission, however patient is without abdominal pain or confusion. Abdomen is getting distended. May need paracentesis soon.  Nausea/vomiting Secondary to above. -Continue Zofran PRN  AKI on CKD stage IV Baseline creatinine of 1.4-1.6. Creatinine of 2.90 on admission. Presumed secondary to hypovolemia. Some improvement with initial IV fluid resuscitation and creatinine continues to improve -BMP in AM  Hyponatremia Patient with chronic hyponatremia. Sodium down to a low of 124.  Presumed secondary to hypovolemia. Improvement with initial IV fluids. Sodium of 129 this morning. -BMP in AM  CAD Ischemic cardiomyopathy Home Coreg and lisinopril held secondary to low-normal blood pressure.  Prediabetes Hemoglobin A1C of 5.8%.  GERD -Continue Protonix  Increased nutrient needs Noted by dietitian. No diagnosis of malnutrition. -Dietician recommendations (3/31): Ensure Enlive po TID, each supplement provides 350 kcal and 20 grams of protein MVI with minerals daily   DVT prophylaxis: SCDs Code Status:   Code Status: Full Code Family Communication: Wife at bedside Disposition Plan: Discharge home likely in 2-5 days pending specialist recommendations/management and outpatient antibiotic regimen   Consultants:  [facility] Infectious disease Interventional radiology  Procedures:  None  Antimicrobials: Vancomycin Flagyl Cefepime Ceftriaxone Unasyn    Subjective: Patient reports no specific concerns.   Objective: BP (!) 95/59 (BP Location: Left Arm)   Pulse 94   Temp 98.1 F (36.7 C) (Oral)   Resp 15   Ht 5\' 11"  (1.803 m)   Wt 75.8 kg   SpO2 97%   BMI 23.29 kg/m   Examination:  General exam: Appears calm and comfortable Respiratory system: Clear to auscultation. Respiratory effort normal. Cardiovascular system: S1 & S2 heard, RRR. Gastrointestinal system: Abdomen is distended, soft and nontender. Normal bowel sounds heard. Central nervous system: Alert and oriented. No focal neurological deficits. Musculoskeletal: No edema. No calf tenderness Psychiatry: Judgement and insight appear normal. Blunt affect   Data Reviewed: I have personally reviewed following labs and imaging studies  CBC Lab Results  Component Value Date   WBC 7.6 11/09/2022   RBC 2.49 (L) 11/09/2022   HGB 8.3 (L) 11/09/2022   HCT 23.8 (L) 11/09/2022   MCV 95.6 11/09/2022   MCH 33.3 11/09/2022   PLT 74 (L) 11/09/2022   MCHC 34.9 11/09/2022   RDW  16.7 (H) 11/09/2022   LYMPHSABS 0.6 (  L) 11/07/2022   MONOABS 1.9 (H) 11/07/2022   EOSABS 0.0 11/07/2022   BASOSABS 0.0 [id number]     Last metabolic panel Lab Results  Component Value Date   NA 129 (L) 11/09/2022   K 3.6 11/09/2022   CL 99 11/09/2022   CO2 21 (L) 11/09/2022   BUN 34 (H) 11/09/2022   CREATININE 2.28 (H) 11/09/2022   GLUCOSE 153 (H) 11/09/2022   GFRNONAA 31 (L) 11/09/2022   CALCIUM 7.7 (L) 11/09/2022   PHOS 4.0 11/08/2022   PROT 4.9 (L) 11/09/2022   ALBUMIN <1.5 (L) 11/09/2022   LABGLOB 3.1 06/16/2021   AGRATIO 1.2 06/16/2021   BILITOT 2.6 (H) 11/09/2022   ALKPHOS 298 (H) 11/09/2022   AST 108 (H) 11/09/2022   ALT 62 (H) 11/09/2022   ANIONGAP 9 11/09/2022    GFR: Estimated Creatinine Clearance: 33.9 mL/min (A) (by C-G formula based on SCr of 2.28 mg/dL (H)).  Recent Results (from the past 240 hour(s))  Blood Culture (routine x 2)     Status: Abnormal (Preliminary result)   Collection Time: 11/07/22 12:13 PM   Specimen: Left Antecubital; Blood  Result Value Ref Range Status   Specimen Description   Final    LEFT ANTECUBITAL BOTTLES DRAWN AEROBIC AND ANAEROBIC Performed at [facility], [address], [place], [place] [address]    Special Requests   Final    Blood Culture adequate volume Performed at [facility], [address], [place], [place] [address]    Culture  Setup Time   Final    GRAM POSITIVE COCCI Gram Stain Report Called to,Read Back By and Verified With: LOPEZ,V @ [id number] ON 11/08/22 BY JUW AEROBIC BOTTLE ONLY GS DONE @ [facility] CRITICAL RESULT CALLED TO, READ BACK BY AND VERIFIED WITH: Oxly Frederika:[id number] AT 0846 BY EC  ANAB REVIEWED BY A. LAFRANCE    Culture (A)  Final    ENTEROCOCCUS FAECALIS SUSCEPTIBILITIES TO FOLLOW Performed at [facility], [place] [address] [place], [place] [address]    Report Status PENDING  Incomplete  Blood Culture (routine x 2)     Status: None (Preliminary result)   Collection Time: 11/07/22  12:13 PM   Specimen: BLOOD LEFT HAND  Result Value Ref Range Status   Specimen Description   Final    BLOOD LEFT HAND BOTTLES DRAWN AEROBIC AND ANAEROBIC Performed at [facility], [address], [place], [place] [address]    Special Requests   Final    Blood Culture adequate volume Performed at [facility], [address] [place], [place] [address]    Culture  Setup Time   Final    GRAM POSITIVE COCCI Gram Stain Report Called to,Read Back By and Verified With: B.BARNES @ 0841 BY STEPHTR 11/08/22  REVIEWED BY A. LAFRANCE CRITICAL VALUE NOTED.  VALUE IS CONSISTENT WITH PREVIOUSLY REPORTED AND CALLED VALUE. IN BOTH AEROBIC AND ANAEROBIC BOTTLES Performed at [facility], [place] [address], [place], [place] [address]    Culture [facility] POSITIVE COCCI  Final   Report Status PENDING  Incomplete  Blood Culture ID Panel (Reflexed)     Status: Abnormal   Collection Time: 11/07/22 12:13 PM  Result Value Ref Range Status   Enterococcus faecalis DETECTED (A) NOT DETECTED Final    Comment: CRITICAL RESULT CALLED TO, READ BACK BY AND VERIFIED WITH: PHARMD CATHY PIERCE :[id number] AT 0846 BY EC    Enterococcus Faecium NOT DETECTED NOT DETECTED Final   Listeria monocytogenes NOT DETECTED  NOT DETECTED Final   Staphylococcus species NOT DETECTED NOT DETECTED Final   Staphylococcus aureus (BCID) NOT DETECTED NOT DETECTED Final   Staphylococcus epidermidis NOT DETECTED NOT DETECTED Final   Staphylococcus lugdunensis NOT DETECTED NOT DETECTED Final   Streptococcus species NOT DETECTED NOT DETECTED Final   Streptococcus agalactiae NOT DETECTED NOT DETECTED Final   Streptococcus pneumoniae NOT DETECTED NOT DETECTED Final   Streptococcus pyogenes NOT DETECTED NOT DETECTED Final   A.calcoaceticus-baumannii NOT DETECTED NOT DETECTED Final   Bacteroides fragilis NOT DETECTED NOT DETECTED Final   Enterobacterales NOT DETECTED NOT DETECTED Final   Enterobacter cloacae complex NOT DETECTED NOT DETECTED  Final   Escherichia coli NOT DETECTED NOT DETECTED Final   Klebsiella aerogenes NOT DETECTED NOT DETECTED Final   Klebsiella oxytoca NOT DETECTED NOT DETECTED Final   Klebsiella pneumoniae NOT DETECTED NOT DETECTED Final   Proteus species NOT DETECTED NOT DETECTED Final   Salmonella species NOT DETECTED NOT DETECTED Final   Serratia marcescens NOT DETECTED NOT DETECTED Final   Haemophilus influenzae NOT DETECTED NOT DETECTED Final   Neisseria meningitidis NOT DETECTED NOT DETECTED Final   Pseudomonas aeruginosa NOT DETECTED NOT DETECTED Final   Stenotrophomonas maltophilia NOT DETECTED NOT DETECTED Final   Candida albicans NOT DETECTED NOT DETECTED Final   Candida auris NOT DETECTED NOT DETECTED Final   Candida glabrata NOT DETECTED NOT DETECTED Final   Candida krusei NOT DETECTED NOT DETECTED Final   Candida parapsilosis NOT DETECTED NOT DETECTED Final   Candida tropicalis NOT DETECTED NOT DETECTED Final   Cryptococcus neoformans/gattii NOT DETECTED NOT DETECTED Final   Vancomycin resistance NOT DETECTED NOT DETECTED Final    Comment: Performed at [facility], [address], [place], [place] [address]  Resp panel by RT-PCR (RSV, Flu A&B, Covid) Anterior Nasal Swab     Status: None   Collection Time: 11/07/22  1:00 PM   Specimen: Anterior Nasal Swab  Result Value Ref Range Status   SARS Coronavirus 2 by RT PCR NEGATIVE NEGATIVE Final    Comment: (NOTE) SARS-CoV-2 target nucleic acids are NOT DETECTED.  The SARS-CoV-2 RNA is generally detectable in upper respiratory specimens during the acute phase of infection. The lowest concentration of SARS-CoV-2 viral copies this assay can detect is 138 copies/mL. A negative result does not preclude SARS-Cov-2 infection and should not be used as the sole basis for treatment or other patient management decisions. A negative result may occur with  improper specimen collection/handling, submission of specimen other than nasopharyngeal  swab, presence of viral mutation(s) within the areas targeted by this assay, and inadequate number of viral copies(<138 copies/mL). A negative result must be combined with clinical observations, patient history, and epidemiological information. The expected result is Negative.  Fact Sheet for Patients:  EntrepreneurPulse.com.au  Fact Sheet for Healthcare Providers:  IncredibleEmployment.be  This test is no t yet approved or cleared by the [place] FDA and  has been authorized for detection and/or diagnosis of SARS-CoV-2 by FDA under an Emergency Use Authorization (EUA). This EUA will remain  in effect (meaning this test can be used) for the duration of the COVID-19 declaration under Section 564(b)(1) of the Act, 21 U.S.C.section 360bbb-3(b)(1), unless the authorization is terminated  or revoked sooner.       Influenza A by PCR NEGATIVE NEGATIVE Final   Influenza B by PCR NEGATIVE NEGATIVE Final    Comment: (NOTE) The Xpert Xpress SARS-CoV-2/FLU/RSV plus assay is intended as an aid in the diagnosis of  influenza from Nasopharyngeal swab specimens and should not be used as a sole basis for treatment. Nasal washings and aspirates are unacceptable for Xpert Xpress SARS-CoV-2/FLU/RSV testing.  Fact Sheet for Patients: EntrepreneurPulse.com.au  Fact Sheet for Healthcare Providers: IncredibleEmployment.be  This test is not yet approved or cleared by the [place] FDA and has been authorized for detection and/or diagnosis of SARS-CoV-2 by FDA under an Emergency Use Authorization (EUA). This EUA will remain in effect (meaning this test can be used) for the duration of the COVID-19 declaration under Section 564(b)(1) of the Act, 21 U.S.C. section 360bbb-3(b)(1), unless the authorization is terminated or revoked.     Resp Syncytial Virus by PCR NEGATIVE NEGATIVE Final    Comment: (NOTE) Fact Sheet for  Patients: EntrepreneurPulse.com.au  Fact Sheet for Healthcare Providers: IncredibleEmployment.be  This test is not yet approved or cleared by the [place] FDA and has been authorized for detection and/or diagnosis of SARS-CoV-2 by FDA under an Emergency Use Authorization (EUA). This EUA will remain in effect (meaning this test can be used) for the duration of the COVID-19 declaration under Section 564(b)(1) of the Act, 21 U.S.C. section 360bbb-3(b)(1), unless the authorization is terminated or revoked.  Performed at [facility], [address] [place], [place] [address]   Gram stain     Status: None   Collection Time: 11/08/22  1:34 PM   Specimen: PATH Cytology Peritoneal fluid  Result Value Ref Range Status   Specimen Description PERITONEAL  Final   Special Requests NONE  Final   Gram Stain   Final    RARE WBC PRESENT, PREDOMINANTLY PMN NO ORGANISMS SEEN Performed at [facility], [place] [address] [place], [place] [address]    Report Status 11/08/2022 FINAL  Final  Culture, body fluid w Gram Stain-bottle     Status: None (Preliminary result)   Collection Time: 11/08/22  1:34 PM   Specimen: Peritoneal Washings  Result Value Ref Range Status   Specimen Description PERITONEAL  Final   Special Requests NONE  Final   Culture   Final    NO GROWTH < 24 HOURS Performed at [facility], [place] [address], [place], [place] [address]    Report Status PENDING  Incomplete      Radiology Studies: US Paracentesis  Result Date: 11/08/2022 INDICATION: Cholangiocarcinoma with small volume ascites request for therapeutic paracentesis. EXAM: ULTRASOUND GUIDED PARACENTESIS MEDICATIONS: 1% lidocaine 10 mL COMPLICATIONS: None immediate. PROCEDURE: Informed written consent was obtained from the patient after a discussion of the risks, benefits and alternatives to treatment. A timeout was performed prior to the initiation of the procedure. Initial  ultrasound scanning demonstrates a small amount of ascites within the right upper abdomen. The right upper abdomen was prepped and draped in the usual sterile fashion. 1% lidocaine was used for local anesthesia. Following this, a 19 gauge, 7-cm, Yueh catheter was introduced. An ultrasound image was saved for documentation purposes. The paracentesis was performed. The catheter was removed and a dressing was applied. The patient tolerated the procedure well without immediate post procedural complication. Scratch FINDINGS: A total of approximately 1.2 L of clear yellow fluid was removed. Samples were sent to the laboratory as requested by the clinical team. IMPRESSION: Successful ultrasound-guided paracentesis yielding 1.2 liters of peritoneal fluid. Procedure performed by: Gareth Eagle, PA-C Electronically Signed   By: Aletta Edouard M.D.   On: 11/08/2022 14:08   CT ABDOMEN PELVIS WO CONTRAST  Result Date: 11/07/2022 CLINICAL DATA:  Abdominal  pain, acute, nonlocalized. Fever with weakness and dizziness for 3 days. Jaundice. History of intrahepatic cholangiocarcinoma. * Tracking Code: BO * EXAM: CT ABDOMEN AND PELVIS WITHOUT CONTRAST TECHNIQUE: Multidetector CT imaging of the abdomen and pelvis was performed following the standard protocol without IV contrast. RADIATION DOSE REDUCTION: This exam was performed according to the departmental dose-optimization program which includes automated exposure control, adjustment of the mA and/or kV according to patient size and/or use of iterative reconstruction technique. COMPARISON:  Abdominopelvic CT 08/28/2022. Abdominal MRI 10/19/2022. FINDINGS: Lower chest: 4 mm right middle lobe nodule on image 4/4 is unchanged from the prior chest CT. There is central airway thickening at both lung bases with mild dependent right lower lobe atelectasis. No consolidation or enlarging nodule identified. Aortic and coronary artery atherosclerosis noted. 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 Hepatobiliary: Previous embolization procedure for intrahepatic cholangiocarcinoma noted centrally in the liver, with associated beam hardening artifact. No recurrent focal mass lesion identified. Previously demonstrated known large gallstone is not well visualized. The gallbladder appears unchanged with mild wall thickening. A metallic biliary stent is in place with interval placement of a plastic biliary stent extending superiorly into the hepatic hilum and inferiorly into the ampulla. There is no pneumobilia, and the intrahepatic biliary dilatation appears slightly increased. Pancreas: Unremarkable. No pancreatic ductal dilatation or surrounding inflammatory changes. Spleen: Normal in size without focal abnormality. Adrenals/Urinary Tract: Both adrenal glands appear normal. No evidence of renal or ureteral calculus, hydronephrosis or perinephric soft tissue stranding. Both kidneys appear unremarkable. A calcified bladder calculus is again noted. Foley catheter extends into the bladder lumen. The bladder is decompressed. Stomach/Bowel: No enteric contrast administered. The stomach appears unremarkable for its degree of distention. There is possible mild small bowel wall thickening in the mid abdomen, but no significant bowel distension. Mild diverticulosis of the distal colon. The colon and appendix otherwise appear unremarkable. Vascular/Lymphatic: There are no enlarged abdominal or pelvic lymph nodes. Diffuse aortic and branch vessel atherosclerosis with chronically displaced intimal calcifications within the infrarenal abdominal aorta. No focal aneurysm. Reproductive: The prostate gland and seminal vesicles appear unremarkable. Other: Interval increased volume of ascites with mild nodularity throughout the omental and mesenteric fat, suspicious for carcinomatosis. No free intraperitoneal air. Musculoskeletal: No acute or significant osseous findings. Interval  increased diffuse subcutaneous edema, asymmetric to the right. Stable mild lumbar spondylosis. IMPRESSION: 1. Interval increased volume of ascites with mild nodularity throughout the omental and mesenteric fat, suspicious for carcinomatosis. 2. Interval placement of a plastic biliary stent through the metallic wall stent which appears well positioned. However, there is increased intrahepatic biliary dilatation and no pneumobilia, suspicious for stent dysfunction. Correlate with liver function studies. 3. Previous embolization procedure for intrahepatic cholangiocarcinoma. No recurrent focal mass lesion identified. 4. No evidence of bowel obstruction or perforation. Possible mild small bowel wall thickening in the mid abdomen. 5. Stable 4 mm right middle lobe pulmonary nodule. 6. Stable calcified bladder calculus. 7. Interval increased diffuse subcutaneous edema, asymmetric to the right. 8.  Aortic Atherosclerosis (ICD10-I70.0). Electronically Signed   By: Richardean Sale M.D.   On: 11/07/2022 14:05   DG Chest Port 1 View  Result Date: 11/07/2022 CLINICAL DATA:  Questionable sepsis - evaluate for abnormality EXAM: PORTABLE CHEST - 1 VIEW COMPARISON:  04/09/2022 FINDINGS: Right arm PICC line has been placed to the distal SVC, the right IJ port removed. Lungs are clear. Heart size and mediastinal contours are within normal limits. No effusion.  Visualized bones unremarkable. IMPRESSION: No acute cardiopulmonary disease. Electronically Signed   By: Lucrezia Europe M.D.   On: 11/07/2022 12:03      LOS: 2 days    Cordelia Poche, MD [facility] 11/09/2022, 10:33 AM   If 7PM-7AM, please contact night-coverage [email or web address]

## 2022-11-09 NOTE — Progress Notes (Signed)
Interventional Radiology Brief Note:  Plan in place for patient to proceed with IR biliary drain placement tomorrow p stent removal today with GI.  Order in place for NPO p MN.  Not currently on blood thinners.   Brynda Greathouse, MS RD PA-C

## 2022-11-09 NOTE — Anesthesia Preprocedure Evaluation (Signed)
Anesthesia Evaluation  Patient identified by MRN, date of birth, ID band Patient awake    Reviewed: Allergy & Precautions, H&P , NPO status , Patient's Chart, lab work & pertinent test results  Airway Mallampati: II  TM Distance: >3 FB Neck ROM: Full    Dental no notable dental hx.    Pulmonary Current Smoker and Patient abstained from smoking.   Pulmonary exam normal breath sounds clear to auscultation       Cardiovascular hypertension, + CAD, + Past MI and + Peripheral Vascular Disease  Normal cardiovascular exam Rhythm:Regular Rate:Normal   Left Ventricle: The mid to basal inferior wall is akinetic and  aneurysmal. The very distal apex is akinetic. There is dense slow moving  circulating blood flow in the apex without clear apical thrombus. Left  ventricular ejection fraction, by estimation,  is 45 to 50%. The left ventricle has mildly decreased function. The left  ventricle demonstrates regional wall motion abnormalities. Definity  contrast agent was given IV to delineate the left ventricular end     Neuro/Psych negative neurological ROS  negative psych ROS   GI/Hepatic Neg liver ROS,,,history of metastatic cholangiocarcinoma   Endo/Other  diabetes    Renal/GU Renal InsufficiencyRenal disease  negative genitourinary   Musculoskeletal negative musculoskeletal ROS (+)    Abdominal   Peds negative pediatric ROS (+)  Hematology  (+) Blood dyscrasia, anemia thrombocytopenia   Anesthesia Other Findings   Reproductive/Obstetrics negative OB ROS                             Anesthesia Physical Anesthesia Plan  ASA: 4  Anesthesia Plan: General   Post-op Pain Management: Minimal or no pain anticipated   Induction: Intravenous  PONV Risk Score and Plan: 1 and Ondansetron, Dexamethasone and Treatment may vary due to age or medical condition  Airway Management Planned: Oral  ETT  Additional Equipment:   Intra-op Plan:   Post-operative Plan: Extubation in OR  Informed Consent: I have reviewed the patients History and Physical, chart, labs and discussed the procedure including the risks, benefits and alternatives for the proposed anesthesia with the patient or authorized representative who has indicated his/her understanding and acceptance.     Dental advisory given  Plan Discussed with: CRNA and Surgeon  Anesthesia Plan Comments:         Anesthesia Quick Evaluation

## 2022-11-09 NOTE — Op Note (Signed)
[facility] Patient Name: Jonathan Keith Procedure Date : 11/09/2022 MRN: [id number] Attending MD: Ladene Artist , MD, [id number] Date of Birth: [birth year] CSN: [id number] Age: 67 Admit Type: Inpatient Procedure:                ERCP Indications:              Malignant stricture of the common bile duct,                            Suspected ascending cholangitis, Elevated LFTs,                            Cholangiocarcinoma Providers:                Pricilla Riffle. Fuller Plan, MD, Dulcy Fanny, Janee Morn, Technician Referring MD:             [facility] Medicines:                General Anesthesia Complications:            No immediate complications. Estimated Blood Loss:     Estimated blood loss was minimal. Procedure:                Pre-Anesthesia Assessment:                           - Prior to the procedure, a History and Physical                            was performed, and patient medications and                            allergies were reviewed. The patient's tolerance of                            previous anesthesia was also reviewed. The risks                            and benefits of the procedure and the sedation                            options and risks were discussed with the patient.                            All questions were answered, and informed consent                            was obtained. Prior Anticoagulants: The patient has                            taken no anticoagulant or antiplatelet agents. ASA                            Grade Assessment: III -  A patient with severe                            systemic disease. After reviewing the risks and                            benefits, the patient was deemed in satisfactory                            condition to undergo the procedure.                           After obtaining informed consent, the scope was                            passed under direct vision. Throughout  the                            procedure, the patient's blood pressure, pulse, and                            oxygen saturations were monitored continuously. The                            [id number] Olympus duodenoscope was                            introduced through the mouth, and used to inject                            contrast into and used to cannulate the bile duct.                            The ERCP was accomplished without difficulty. The                            patient tolerated the procedure well. ERCP                            performed in the left lateral decubitus position                            per prior ERCP. Scope In: Scope Out: Findings:      Biliary stents were visible on the scout film. The esophagus was       successfully intubated under direct vision. The scope was advanced to       the major papilla in the descending duodenum without detailed       examination of the pharynx, larynx and associated structures, and upper       GI tract. Prior sphincterotomy seen. Plastic stent at the papilla seen.       The upper GI tract was otherwise grossly normal on a limited exam. A       straight Roadrunner wire was passed into the biliary tree. The       short-nosed traction  sphincterotome was passed over the guidewire and       the bile duct was then deeply cannulated. Contrast was injected. I       personally interpreted the bile duct images. Ductal flow of contrast was       adequate. The common hepatic duct and left main hepatic duct contained a       single segmental stenosis. The right hepatic system did not appear to       fill. The common bile duct to left main duct contained one plastic       stent. The stent was partially occluded. The common bile duct contained       one uncovered metal stent. The stent was visibly patent. One plastic       stent was removed from using a rat-toothed forceps and snare. The       biliary tree was swept with a 12  mm balloon starting at the bifurcation.       A small volume of sludge was swept from the duct. The PD was not       cannulated or injected by intention. Impression:               - A single segmental biliary stricture was found in                            the common hepatic duct and left main hepatic duct.                            The stricture was malignant appearing.                           - Presence of biliary stent in the left main duct.                            This was found to be partially occluded.                           - Presence of UCSEMS biliary stent in the common                            bile duct. This was found to be patent.                           - Prior sphincterotomy seen.                           - The right hepatic system did not appear to fill.                           - One plastic stent was removed from the common                            bile duct, left main duct.                           - The biliary tree was swept and a small volume  sludge was found. Recommendation:           - Return patient to hospital ward for ongoing care.                           - Clear liquid diet today, then advance as                            tolerated to full liquid diet later today.                           - Continue current antibiotics. ID following.                           - IR following for further mgmt of biliary                            obstruction. Procedure Code(s):        --- Professional ---                           [phone], Endoscopic retrograde                            cholangiopancreatography (ERCP); with removal of                            foreign body(s) or stent(s) from biliary/pancreatic                            duct(s)                           [id number], Endoscopic retrograde                            cholangiopancreatography (ERCP); with removal of                            calculi/debris from  biliary/pancreatic duct(s) Diagnosis Code(s):        --- Professional ---                           K83.1, Obstruction of bile duct                           Z46.59, Encounter for fitting and adjustment of                            other gastrointestinal appliance and device CPT copyright 2022 [employer]. All rights reserved. The codes documented in this report are preliminary and upon coder review may  be revised to meet current compliance requirements. Ladene Artist, MD 11/09/2022 2:37:18 PM This report has been signed electronically. Number of Addenda: 0

## 2022-11-09 NOTE — Plan of Care (Signed)

## 2022-11-09 NOTE — Anesthesia Procedure Notes (Signed)
Procedure Name: Intubation Date/Time: 11/09/2022 1:46 PM  Performed by: Lavell Luster, CRNAPre-anesthesia Checklist: Patient identified, Emergency Drugs available, Suction available, Patient being monitored and Timeout performed Patient Re-evaluated:Patient Re-evaluated prior to induction Oxygen Delivery Method: Circle system utilized Preoxygenation: Pre-oxygenation with 100% oxygen Induction Type: IV induction Ventilation: Mask ventilation without difficulty Laryngoscope Size: Mac and 4 Grade View: Grade I Tube type: Oral Tube size: 7.5 mm Number of attempts: 1 Airway Equipment and Method: Stylet Placement Confirmation: ETT inserted through vocal cords under direct vision, positive ETCO2 and breath sounds checked- equal and bilateral Secured at: 21 cm Tube secured with: Tape Dental Injury: Teeth and Oropharynx as per pre-operative assessment

## 2022-11-09 NOTE — Interval H&P Note (Signed)
History and Physical Interval Note:  11/09/2022 12:51 PM  Jonathan Keith  has presented today for surgery, with the diagnosis of Cholangiocarcinoma, biliary obstruction, bacteremia.  The various methods of treatment have been discussed with the patient and family. After consideration of risks, benefits and other options for treatment, the patient has consented to  Procedure(s) with comments: ENDOSCOPIC RETROGRADE CHOLANGIOPANCREATOGRAPHY (ERCP) (N/A) - Stent removal as a surgical intervention.  The patient's history has been reviewed, patient examined, no change in status, stable for surgery.  I have reviewed the patient's chart and labs.  Questions were answered to the patient's satisfaction.     Pricilla Riffle. Fuller Plan

## 2022-11-09 NOTE — Anesthesia Postprocedure Evaluation (Signed)
Anesthesia Post Note  Patient: Jonathan Keith  Procedure(s) Performed: ENDOSCOPIC RETROGRADE CHOLANGIOPANCREATOGRAPHY (ERCP) STENT REMOVAL REMOVAL OF STONES     Patient location during evaluation: PACU Anesthesia Type: General Level of consciousness: awake Pain management: pain level controlled Vital Signs Assessment: post-procedure vital signs reviewed and stable Respiratory status: spontaneous breathing, nonlabored ventilation and respiratory function stable Cardiovascular status: blood pressure returned to baseline and stable Postop Assessment: no apparent nausea or vomiting Anesthetic complications: no  No notable events documented.  Last Vitals:  Vitals:   11/09/22 1540 11/09/22 1943  BP: 97/61 (!) 89/62  Pulse: 78 80  Resp: 17 15  Temp: (!) 36.3 C 36.6 C  SpO2: 96% 96%    Last Pain:  Vitals:   11/09/22 1943  TempSrc: Oral  PainSc:                  Nilda Simmer

## 2022-11-10 ENCOUNTER — Other Ambulatory Visit (HOSPITAL_COMMUNITY): Payer: Medicare Other

## 2022-11-10 ENCOUNTER — Encounter (HOSPITAL_COMMUNITY): Payer: Self-pay | Admitting: Gastroenterology

## 2022-11-10 ENCOUNTER — Inpatient Hospital Stay (HOSPITAL_COMMUNITY): Payer: Medicare Other

## 2022-11-10 DIAGNOSIS — A4150 Gram-negative sepsis, unspecified: Secondary | ICD-10-CM | POA: Diagnosis not present

## 2022-11-10 DIAGNOSIS — E871 Hypo-osmolality and hyponatremia: Secondary | ICD-10-CM | POA: Diagnosis not present

## 2022-11-10 DIAGNOSIS — I255 Ischemic cardiomyopathy: Secondary | ICD-10-CM | POA: Diagnosis not present

## 2022-11-10 DIAGNOSIS — R7881 Bacteremia: Secondary | ICD-10-CM

## 2022-11-10 DIAGNOSIS — C221 Intrahepatic bile duct carcinoma: Secondary | ICD-10-CM | POA: Diagnosis not present

## 2022-11-10 DIAGNOSIS — K831 Obstruction of bile duct: Secondary | ICD-10-CM | POA: Diagnosis not present

## 2022-11-10 DIAGNOSIS — R749 Abnormal serum enzyme level, unspecified: Secondary | ICD-10-CM

## 2022-11-10 DIAGNOSIS — R112 Nausea with vomiting, unspecified: Secondary | ICD-10-CM

## 2022-11-10 DIAGNOSIS — C7889 Secondary malignant neoplasm of other digestive organs: Secondary | ICD-10-CM | POA: Diagnosis not present

## 2022-11-10 DIAGNOSIS — A419 Sepsis, unspecified organism: Secondary | ICD-10-CM | POA: Diagnosis not present

## 2022-11-10 LAB — CBC
HCT: 23.5 % — ABNORMAL LOW (ref 39.0–52.0)
Hemoglobin: 8.3 g/dL — ABNORMAL LOW (ref 13.0–17.0)
MCH: 33.6 pg (ref 26.0–34.0)
MCHC: 35.3 g/dL (ref 30.0–36.0)
MCV: 95.1 fL (ref 80.0–100.0)
Platelets: 67 10*3/uL — ABNORMAL LOW (ref 150–400)
RBC: 2.47 MIL/uL — ABNORMAL LOW (ref 4.22–5.81)
RDW: 16.6 % — ABNORMAL HIGH (ref 11.5–15.5)
WBC: 8.3 10*3/uL (ref 4.0–10.5)
nRBC: 0 % (ref 0.0–0.2)

## 2022-11-10 LAB — COMPREHENSIVE METABOLIC PANEL
ALT: 68 U/L — ABNORMAL HIGH (ref 0–44)
AST: 93 U/L — ABNORMAL HIGH (ref 15–41)
Albumin: <1.5 g/dL — ABNORMAL LOW (ref 3.5–5.0)
Alkaline Phosphatase: 348 U/L — ABNORMAL HIGH (ref 38–126)
Anion gap: 9 (ref 5–15)
BUN: 44 mg/dL — ABNORMAL HIGH (ref 8–23)
CO2: 20 mmol/L — ABNORMAL LOW (ref 22–32)
Calcium: 7.9 mg/dL — ABNORMAL LOW (ref 8.9–10.3)
Chloride: 102 mmol/L (ref 98–111)
Creatinine, Ser: 2.92 mg/dL — ABNORMAL HIGH (ref 0.61–1.24)
GFR, Estimated: 23 mL/min — ABNORMAL LOW (ref >60)
Glucose, Bld: 139 mg/dL — ABNORMAL HIGH (ref 70–99)
Potassium: 4.2 mmol/L (ref 3.5–5.1)
Sodium: 131 mmol/L — ABNORMAL LOW (ref 135–145)
Total Bilirubin: 2.1 mg/dL — ABNORMAL HIGH (ref 0.3–1.2)
Total Protein: 4.7 g/dL — ABNORMAL LOW (ref 6.5–8.1)

## 2022-11-10 LAB — CULTURE, BLOOD (ROUTINE X 2)
Special Requests: ADEQUATE
Special Requests: ADEQUATE

## 2022-11-10 LAB — ECHOCARDIOGRAM COMPLETE
Area-P 1/2: 4.77 cm2
Calc EF: 44.9 %
Height: 71 in
Single Plane A2C EF: 37.7 %
Single Plane A4C EF: 50.9 %
Weight: 2673.74 oz

## 2022-11-10 LAB — PH, BODY FLUID: pH, Body Fluid: 7.6

## 2022-11-10 LAB — CYTOLOGY - NON PAP

## 2022-11-10 MED ORDER — PERFLUTREN LIPID MICROSPHERE
1.0000 mL | INTRAVENOUS | Status: AC | PRN
  Administered 2022-11-10: 4 mL via INTRAVENOUS

## 2022-11-10 NOTE — Progress Notes (Signed)
PROGRESS NOTE    Jonathan Keith  [id number] DOB: 05-Aug-1956 DOA: 11/07/2022 PCP: Redmond School, MD   Brief Narrative: Jonathan Keith is a 67 y.o. male with a history of metastatic cholangiocarcinoma with biliary obstruction s/p stent placement, hyperlipidemia, CAD, ischemic cardiomyopathy, CHF, AKI on CKD stage IV, anemia of chronic disease. Patient presented secondary to fever and weakness with evidence of sepsis presumed secondary to biliary obstruction and SBP. Workup revealed associated enterococcus bacteremia. GI and ID consulted. Plan for percutaneous bile duct drain per IR and ERCP for removal of plastic biliary stent. Patient managed on antibiotics for bacteremia. ERCP performed on 4/1 with biliary stent removal.   Assessment and Plan:  Sepsis Secondary to biliary obstruction and bacteremia. Concern for possible SBP. ID consulted and have deescalated antibiotics to Unasyn monotherapy.  Biliary obstruction Patient with known biliary obstruction secondary to known cholangiocarcinoma with recent biliary stent placement. Concern for biliary stent malfunction. GI consulted and performed ERCP on 4/1, removing plastic stent with biliary tree sweep and removal of sludge. IR plan for percutaneous bile duct drain (4/2). -Diet per GI recommendations -Follow-up IR for drain placement  Enterococcus faecalis bacteremia ID consulted. Patient transitioned to Unasyn. Repeat blood cultures (4/1) with no growth to date. -ID recommendations: Unasyn, repeat blood cultures (4/1), Transthoracic Echocardiogram -Transthoracic Echocardiogram pending  Malignant ascites Metastatic cholangiocarcinoma Associated concern for SBP on admission, however patient is without abdominal pain or confusion. Abdomen is getting distended. May need paracentesis soon.  Nausea/vomiting Secondary to above. -Continue Zofran PRN  AKI on CKD stage IV Baseline creatinine of 1.4-1.6. Creatinine of 2.90 on  admission. Presumed secondary to hypovolemia. Some improvement with initial IV fluid resuscitation and creatinine continues to improve -CMP pending  Hyponatremia Patient with chronic hyponatremia. Sodium down to a low of 124. Presumed secondary to hypovolemia. Improvement with initial IV fluids. Sodium of 129 this morning. -CMP pending  CAD Ischemic cardiomyopathy Home Coreg and lisinopril held secondary to low-normal blood pressure.  Prediabetes Hemoglobin A1C of 5.8%.  GERD -Continue Protonix  Increased nutrient needs Noted by dietitian. No diagnosis of malnutrition. -Dietician recommendations (3/31): Ensure Enlive po TID, each supplement provides 350 kcal and 20 grams of protein MVI with minerals daily   DVT prophylaxis: SCDs Code Status:   Code Status: Full Code Family Communication: Wife at bedside Disposition Plan: Discharge home likely in 2-3 days pending specialist recommendations/management and outpatient antibiotic regimen   Consultants:  [facility] Infectious disease Interventional radiology  Procedures:  4/1: ERCP  Antimicrobials: Vancomycin Flagyl Cefepime Ceftriaxone Unasyn    Subjective: No concerns/issues today per patient.  Objective: BP 101/72 (BP Location: Left Arm)   Pulse 74   Temp 97.6 F (36.4 C) (Oral)   Resp 18   Ht 5\' 11"  (1.803 m)   Wt 75.8 kg   SpO2 97%   BMI 23.31 kg/m   Examination:  General exam: Appears calm and comfortable Respiratory system: Clear to auscultation. Respiratory effort normal. Cardiovascular system: S1 & S2 heard, RRR. Gastrointestinal system: Abdomen is distended, soft and nontender. Diminished bowel sounds heard. Central nervous system: Alert and oriented. No focal neurological deficits. Musculoskeletal: No edema. No calf tenderness Skin: No cyanosis. No rashes Psychiatry: Judgement and insight appear normal. Blunt affect   Data Reviewed: I have personally reviewed following labs  and imaging studies  CBC Lab Results  Component Value Date   WBC 7.6 11/09/2022   RBC 2.49 (L) 11/09/2022   HGB 8.3 (L) 11/09/2022   HCT 23.8 (  L) 11/09/2022   MCV 95.6 11/09/2022   MCH 33.3 11/09/2022   PLT 74 (L) 11/09/2022   MCHC 34.9 11/09/2022   RDW 16.7 (H) 11/09/2022   LYMPHSABS 0.6 (L) 11/07/2022   MONOABS 1.9 (H) 11/07/2022   EOSABS 0.0 11/07/2022   BASOSABS 0.0 [id number]     Last metabolic panel Lab Results  Component Value Date   NA 129 (L) 11/09/2022   K 3.6 11/09/2022   CL 99 11/09/2022   CO2 21 (L) 11/09/2022   BUN 34 (H) 11/09/2022   CREATININE 2.28 (H) 11/09/2022   GLUCOSE 153 (H) 11/09/2022   GFRNONAA 31 (L) 11/09/2022   CALCIUM 7.7 (L) 11/09/2022   PHOS 4.0 11/08/2022   PROT 4.9 (L) 11/09/2022   ALBUMIN <1.5 (L) 11/09/2022   LABGLOB 3.1 06/16/2021   AGRATIO 1.2 06/16/2021   BILITOT 2.6 (H) 11/09/2022   ALKPHOS 298 (H) 11/09/2022   AST 108 (H) 11/09/2022   ALT 62 (H) 11/09/2022   ANIONGAP 9 11/09/2022    GFR: Estimated Creatinine Clearance: 33.9 mL/min (A) (by C-G formula based on SCr of 2.28 mg/dL (H)).  Recent Results (from the past 240 hour(s))  Blood Culture (routine x 2)     Status: Abnormal   Collection Time: 11/07/22 12:13 PM   Specimen: Left Antecubital; Blood  Result Value Ref Range Status   Specimen Description   Final    LEFT ANTECUBITAL BOTTLES DRAWN AEROBIC AND ANAEROBIC Performed at [facility], [address] [place], [place] [address]    Special Requests   Final    Blood Culture adequate volume Performed at [facility], [address], [place], [place] [address]    Culture  Setup Time   Final    GRAM POSITIVE COCCI Gram Stain Report Called to,Read Back By and Verified With: LOPEZ,V @ [id number] ON 11/08/22 BY JUW AEROBIC BOTTLE ONLY GS DONE @ [facility] CRITICAL RESULT CALLED TO, READ BACK BY AND VERIFIED WITH: [place] OZ:[id number] AT 0846 BY EC  ANAB REVIEWED BY A. LAFRANCE Performed at [facility], [place]  [address], [place], [place] [address]    Culture ENTEROCOCCUS FAECALIS (A)  Final   Report Status 11/10/2022 FINAL  Final   Organism ID, Bacteria ENTEROCOCCUS FAECALIS  Final      Susceptibility   Enterococcus faecalis - MIC*    AMPICILLIN <=2 SENSITIVE Sensitive     VANCOMYCIN 2 SENSITIVE Sensitive     GENTAMICIN SYNERGY SENSITIVE Sensitive     * ENTEROCOCCUS FAECALIS  Blood Culture (routine x 2)     Status: Abnormal   Collection Time: 11/07/22 12:13 PM   Specimen: BLOOD LEFT HAND  Result Value Ref Range Status   Specimen Description   Final    BLOOD LEFT HAND BOTTLES DRAWN AEROBIC AND ANAEROBIC Performed at [facility], [address], [place], [place] [address]    Special Requests   Final    Blood Culture adequate volume Performed at [facility], [address], [place],  [address]    Culture  Setup Time   Final    GRAM POSITIVE COCCI Gram Stain Report Called to,Read Back By and Verified With: B.BARNES @ 0841 BY STEPHTR 11/08/22  REVIEWED BY A. LAFRANCE CRITICAL VALUE NOTED.  VALUE IS CONSISTENT WITH PREVIOUSLY REPORTED AND CALLED VALUE. IN BOTH AEROBIC AND ANAEROBIC BOTTLES    Culture (A)  Final    ENTEROCOCCUS FAECALIS SUSCEPTIBILITIES PERFORMED ON PREVIOUS CULTURE WITHIN THE LAST 5 DAYS. Performed at [facility], [address], [place], [place] [address]    Report Status 11/10/2022 FINAL  Final  Blood Culture ID Panel (Reflexed)     Status: Abnormal   Collection Time: 11/07/22 12:13 PM  Result Value Ref Range Status   Enterococcus faecalis DETECTED (A) NOT DETECTED Final    Comment: CRITICAL RESULT CALLED TO, READ BACK BY AND VERIFIED WITH: PHARMD CATHY PIERCE OZ:[id number] AT 0846 BY EC    Enterococcus Faecium NOT DETECTED NOT DETECTED Final   Listeria monocytogenes NOT DETECTED NOT DETECTED Final   Staphylococcus species NOT DETECTED NOT DETECTED Final   Staphylococcus aureus (BCID) NOT DETECTED NOT DETECTED Final   Staphylococcus epidermidis NOT DETECTED NOT  DETECTED Final   Staphylococcus lugdunensis NOT DETECTED NOT DETECTED Final   Streptococcus species NOT DETECTED NOT DETECTED Final   Streptococcus agalactiae NOT DETECTED NOT DETECTED Final   Streptococcus pneumoniae NOT DETECTED NOT DETECTED Final   Streptococcus pyogenes NOT DETECTED NOT DETECTED Final   A.calcoaceticus-baumannii NOT DETECTED NOT DETECTED Final   Bacteroides fragilis NOT DETECTED NOT DETECTED Final   Enterobacterales NOT DETECTED NOT DETECTED Final   Enterobacter cloacae complex NOT DETECTED NOT DETECTED Final   Escherichia coli NOT DETECTED NOT DETECTED Final   Klebsiella aerogenes NOT DETECTED NOT DETECTED Final   Klebsiella oxytoca NOT DETECTED NOT DETECTED Final   Klebsiella pneumoniae NOT DETECTED NOT DETECTED Final   Proteus species NOT DETECTED NOT DETECTED Final   Salmonella species NOT DETECTED NOT DETECTED Final   Serratia marcescens NOT DETECTED NOT DETECTED Final   Haemophilus influenzae NOT DETECTED NOT DETECTED Final   Neisseria meningitidis NOT DETECTED NOT DETECTED Final   Pseudomonas aeruginosa NOT DETECTED NOT DETECTED Final   Stenotrophomonas maltophilia NOT DETECTED NOT DETECTED Final   Candida albicans NOT DETECTED NOT DETECTED Final   Candida auris NOT DETECTED NOT DETECTED Final   Candida glabrata NOT DETECTED NOT DETECTED Final   Candida krusei NOT DETECTED NOT DETECTED Final   Candida parapsilosis NOT DETECTED NOT DETECTED Final   Candida tropicalis NOT DETECTED NOT DETECTED Final   Cryptococcus neoformans/gattii NOT DETECTED NOT DETECTED Final   Vancomycin resistance NOT DETECTED NOT DETECTED Final    Comment: Performed at [facility], [address], [place], [place] [address]  Resp panel by RT-PCR (RSV, Flu A&B, Covid) Anterior Nasal Swab     Status: None   Collection Time: 11/07/22  1:00 PM   Specimen: Anterior Nasal Swab  Result Value Ref Range Status   SARS Coronavirus 2 by RT PCR NEGATIVE NEGATIVE Final    Comment:  (NOTE) SARS-CoV-2 target nucleic acids are NOT DETECTED.  The SARS-CoV-2 RNA is generally detectable in upper respiratory specimens during the acute phase of infection. The lowest concentration of SARS-CoV-2 viral copies this assay can detect is 138 copies/mL. A negative result does not preclude SARS-Cov-2 infection and should not be used as the sole basis for treatment or other patient management decisions. A negative result may occur with  improper specimen collection/handling, submission of specimen other than nasopharyngeal swab, presence of viral mutation(s) within the areas targeted by this assay, and inadequate number of viral copies(<138 copies/mL). A negative result must be combined with clinical observations, patient history, and epidemiological information. The expected result is Negative.  Fact Sheet for Patients:  EntrepreneurPulse.com.au  Fact Sheet for Healthcare Providers:  IncredibleEmployment.be  This test is no t yet approved or cleared by the [place] FDA and  has been authorized for detection and/or diagnosis of  SARS-CoV-2 by FDA under an Emergency Use Authorization (EUA). This EUA will remain  in effect (meaning this test can be used) for the duration of the COVID-19 declaration under Section 564(b)(1) of the Act, 21 U.S.C.section 360bbb-3(b)(1), unless the authorization is terminated  or revoked sooner.       Influenza A by PCR NEGATIVE NEGATIVE Final   Influenza B by PCR NEGATIVE NEGATIVE Final    Comment: (NOTE) The Xpert Xpress SARS-CoV-2/FLU/RSV plus assay is intended as an aid in the diagnosis of influenza from Nasopharyngeal swab specimens and should not be used as a sole basis for treatment. Nasal washings and aspirates are unacceptable for Xpert Xpress SARS-CoV-2/FLU/RSV testing.  Fact Sheet for Patients: EntrepreneurPulse.com.au  Fact Sheet for Healthcare  Providers: IncredibleEmployment.be  This test is not yet approved or cleared by the [place] FDA and has been authorized for detection and/or diagnosis of SARS-CoV-2 by FDA under an Emergency Use Authorization (EUA). This EUA will remain in effect (meaning this test can be used) for the duration of the COVID-19 declaration under Section 564(b)(1) of the Act, 21 U.S.C. section 360bbb-3(b)(1), unless the authorization is terminated or revoked.     Resp Syncytial Virus by PCR NEGATIVE NEGATIVE Final    Comment: (NOTE) Fact Sheet for Patients: EntrepreneurPulse.com.au  Fact Sheet for Healthcare Providers: IncredibleEmployment.be  This test is not yet approved or cleared by the [place] FDA and has been authorized for detection and/or diagnosis of SARS-CoV-2 by FDA under an Emergency Use Authorization (EUA). This EUA will remain in effect (meaning this test can be used) for the duration of the COVID-19 declaration under Section 564(b)(1) of the Act, 21 U.S.C. section 360bbb-3(b)(1), unless the authorization is terminated or revoked.  Performed at [facility], [address] [place], [place] [address]   Gram stain     Status: None   Collection Time: 11/08/22  1:34 PM   Specimen: PATH Cytology Peritoneal fluid  Result Value Ref Range Status   Specimen Description PERITONEAL  Final   Special Requests NONE  Final   Gram Stain   Final    RARE WBC PRESENT, PREDOMINANTLY PMN NO ORGANISMS SEEN Performed at [facility], [place] [address], [place], [place] [address]    Report Status 11/08/2022 FINAL  Final  Culture, body fluid w Gram Stain-bottle     Status: None (Preliminary result)   Collection Time: 11/08/22  1:34 PM   Specimen: Peritoneal Washings  Result Value Ref Range Status   Specimen Description PERITONEAL  Final   Special Requests NONE  Final   Culture   Final    NO GROWTH 2 DAYS Performed at [place] [address] [place], [place] [address]    Report Status PENDING  Incomplete  Surgical pcr screen     Status: None   Collection Time: 11/09/22  7:09 AM   Specimen: Nasal Mucosa; Nasal Swab  Result Value Ref Range Status   MRSA, PCR NEGATIVE NEGATIVE Final   Staphylococcus aureus NEGATIVE NEGATIVE Final    Comment: (NOTE) The Xpert SA Assay (FDA approved for NASAL specimens in patients 65 years of age and older), is one component of a comprehensive surveillance program. It is not intended to diagnose infection nor to guide or monitor treatment. Performed at [facility], [place] [address] [place], [place] [address]   Culture, blood (Routine X 2) w Reflex to ID Panel     Status: None (Preliminary result)   Collection Time:  11/09/22 10:06 AM   Specimen: BLOOD LEFT ARM  Result Value Ref Range Status   Specimen Description BLOOD LEFT ARM  Final   Special Requests   Final    BOTTLES DRAWN AEROBIC AND ANAEROBIC Blood Culture adequate volume   Culture   Final    NO GROWTH < 24 HOURS Performed at [facility], [address], [place], [place] [address]    Report Status PENDING  Incomplete  Culture, blood (Routine X 2) w Reflex to ID Panel     Status: None (Preliminary result)   Collection Time: 11/09/22 10:10 AM   Specimen: BLOOD LEFT HAND  Result Value Ref Range Status   Specimen Description BLOOD LEFT HAND  Final   Special Requests   Final    BOTTLES DRAWN AEROBIC AND ANAEROBIC Blood Culture adequate volume   Culture   Final    NO GROWTH < 24 HOURS Performed at [facility], [place] [address], [place], [place] [address]    Report Status PENDING  Incomplete      Radiology Studies: DG ERCP  Result Date: 11/09/2022 CLINICAL DATA:  ERCP EXAM: ERCP TECHNIQUE: Multiple spot images obtained with the fluoroscopic device and submitted for interpretation post-procedure. FLUOROSCOPY: Refer to separate report COMPARISON:  None Available. FINDINGS: A total of 13  fluoroscopic spot images taken during ERCP are submitted for review. Initial images demonstrate a scope overlying the upper abdomen. A plastic biliary stent appears to course through a metallic biliary stent. The plastic biliary stent is removed. Wire catheterization of the common bile duct is performed. Contrast injection opacifies the intrahepatic and extrahepatic biliary tree. The intrahepatic biliary ducts appear normal in caliber. Balloon sweeps are performed through the common bile duct. Final images demonstrate decompression of the intrahepatic biliary tree with passage of contrast material into the small bowel. IMPRESSION: ERCP images as described. Refer to separate procedure report for full details. These images were submitted for radiologic interpretation only. Please see the procedural report for the amount of contrast and the fluoroscopy time utilized. Electronically Signed   By: Albin Felling M.D.   On: 11/09/2022 14:54   US Paracentesis  Result Date: 11/08/2022 INDICATION: Cholangiocarcinoma with small volume ascites request for therapeutic paracentesis. EXAM: ULTRASOUND GUIDED PARACENTESIS MEDICATIONS: 1% lidocaine 10 mL COMPLICATIONS: None immediate. PROCEDURE: Informed written consent was obtained from the patient after a discussion of the risks, benefits and alternatives to treatment. A timeout was performed prior to the initiation of the procedure. Initial ultrasound scanning demonstrates a small amount of ascites within the right upper abdomen. The right upper abdomen was prepped and draped in the usual sterile fashion. 1% lidocaine was used for local anesthesia. Following this, a 19 gauge, 7-cm, Yueh catheter was introduced. An ultrasound image was saved for documentation purposes. The paracentesis was performed. The catheter was removed and a dressing was applied. The patient tolerated the procedure well without immediate post procedural complication. Scratch FINDINGS: A total of  approximately 1.2 L of clear yellow fluid was removed. Samples were sent to the laboratory as requested by the clinical team. IMPRESSION: Successful ultrasound-guided paracentesis yielding 1.2 liters of peritoneal fluid. Procedure performed by: Gareth Eagle, PA-C Electronically Signed   By: Aletta Edouard M.D.   On: 11/08/2022 14:08      LOS: 3 days    Cordelia Poche, MD [facility] 11/10/2022, 8:59 AM   If 7PM-7AM, please contact night-coverage [email or web address]

## 2022-11-10 NOTE — Progress Notes (Signed)
Gastroenterology Inpatient Follow Up    Subjective: Patient feels well. Eager to eat. Denies abdominal pain. Has not had a stool today yet.  Objective: Vital signs in last 24 hours: Temp:  [97.3 F (36.3 C)-98 F (36.7 C)] 97.6 F (36.4 C) (04/02 0744) Pulse Rate:  [74-97] 74 (04/02 0744) Resp:  [12-18] 18 (04/02 0744) BP: (89-109)/(60-72) 101/72 (04/02 0744) SpO2:  [2 %-99 %] 97 % (04/02 0744) Weight:  [75.8 kg] 75.8 kg (04/01 1207) Last BM Date : 11/09/22  Intake/Output from previous day: 04/01 0701 - 04/02 0700 In: 1200 [I.V.:600; IV Piggyback:600] Out: 850 [Urine:850] Intake/Output this shift: No intake/output data recorded.  General appearance: alert and cooperative Resp: no increased WOB Cardio: regular rate GI: non-tender, non-distended Extremities: no BLE edema  Lab Results: Recent Labs    11/08/22 0357 11/09/22 0343 11/10/22 0855  WBC 12.6* 7.6 8.3  HGB 7.9* 8.3* 8.3*  HCT 22.1* 23.8* 23.5*  PLT 79* 74* 67*   BMET Recent Labs    11/08/22 0357 11/09/22 0343 11/10/22 0855  NA 127* 129* 131*  K 3.8 3.6 4.2  CL 97* 99 102  CO2 19* 21* 20*  GLUCOSE 98 153* 139*  BUN 36* 34* 44*  CREATININE 2.44* 2.28* 2.92*  CALCIUM 7.7* 7.7* 7.9*   LFT Recent Labs    11/10/22 0855  PROT 4.7*  ALBUMIN <1.5*  AST 93*  ALT 68*  ALKPHOS 348*  BILITOT 2.1*   PT/INR Recent Labs    11/07/22 1213  LABPROT 16.2*  INR 1.3*   Hepatitis Panel No results for input(s): "HEPBSAG", "HCVAB", "HEPAIGM", "HEPBIGM" in the last 72 hours. C-Diff No results for input(s): "CDIFFTOX" in the last 72 hours.  Studies/Results: DG ERCP  Result Date: 11/09/2022 CLINICAL DATA:  ERCP EXAM: ERCP TECHNIQUE: Multiple spot images obtained with the fluoroscopic device and submitted for interpretation post-procedure. FLUOROSCOPY: Refer to separate report COMPARISON:  None Available. FINDINGS: A total of 13 fluoroscopic spot images taken during ERCP are submitted for review.  Initial images demonstrate a scope overlying the upper abdomen. A plastic biliary stent appears to course through a metallic biliary stent. The plastic biliary stent is removed. Wire catheterization of the common bile duct is performed. Contrast injection opacifies the intrahepatic and extrahepatic biliary tree. The intrahepatic biliary ducts appear normal in caliber. Balloon sweeps are performed through the common bile duct. Final images demonstrate decompression of the intrahepatic biliary tree with passage of contrast material into the small bowel. IMPRESSION: ERCP images as described. Refer to separate procedure report for full details. These images were submitted for radiologic interpretation only. Please see the procedural report for the amount of contrast and the fluoroscopy time utilized. Electronically Signed   By: Albin Felling M.D.   On: 11/09/2022 14:54   US Paracentesis  Result Date: 11/08/2022 INDICATION: Cholangiocarcinoma with small volume ascites request for therapeutic paracentesis. EXAM: ULTRASOUND GUIDED PARACENTESIS MEDICATIONS: 1% lidocaine 10 mL COMPLICATIONS: None immediate. PROCEDURE: Informed written consent was obtained from the patient after a discussion of the risks, benefits and alternatives to treatment. A timeout was performed prior to the initiation of the procedure. Initial ultrasound scanning demonstrates a small amount of ascites within the right upper abdomen. The right upper abdomen was prepped and draped in the usual sterile fashion. 1% lidocaine was used for local anesthesia. Following this, a 19 gauge, 7-cm, Yueh catheter was introduced. An ultrasound image was saved for documentation purposes. The paracentesis was performed. The catheter was removed and a  dressing was applied. The patient tolerated the procedure well without immediate post procedural complication. Scratch FINDINGS: A total of approximately 1.2 L of clear yellow fluid was removed. Samples were sent to  the laboratory as requested by the clinical team. IMPRESSION: Successful ultrasound-guided paracentesis yielding 1.2 liters of peritoneal fluid. Procedure performed by: Gareth Eagle, PA-C Electronically Signed   By: Aletta Edouard M.D.   On: 11/08/2022 14:08    Medications: Scheduled:  Chlorhexidine Gluconate Cloth  6 each Topical Daily   diclofenac  100 mg Rectal Once   feeding supplement  237 mL Oral TID BM   multivitamin with minerals  1 tablet Oral Daily   pantoprazole (PROTONIX) IV  40 mg Intravenous Q24H   sodium chloride flush  10-40 mL Intracatheter Q12H   Continuous:  ampicillin-sulbactam (UNASYN) IV 3 g (11/10/22 1007)   [id number] **OR** ondansetron (ZOFRAN) IV  Assessment/Plan: 67 year old male with history of unresectable cholangiocarcinoma s/p portal vein embolization in 06/2021 with recurrent biliary obstruction and cholangitis s/p stent placement in 05/2021 and 10/2022, DM, and HTN presented with fever and weakness. LFTs were increased from prior upon arrival. CT 3/30 showed increase in ascites and intrahepatic biliary dilation and no pneumobilia raising concern for stent dysfunction. ERCP 4/1 showed partial occlusion of plastic biliary stent that went from the CBD to the left main hepatic duct, which was removed. His previously placed uncovered metal stent was stent. Biliary tree was swept of sludge. Patient is doing well after his procedure. LFTs have decreased. Although original plan was placement of biliary drain by IR, due to improvement in his LFTs we will continue to monitor his LFTs for now and reconsider PTC if LFTs do not improve. This case was discussed with IR, Dr. Rush Landmark, and Dr. Fuller Plan who agree with this plan. No signs of post-ERCP complications at this time.   - Advance to heart healthy diet today - Continue to trend LFTs - Antibiotics per ID   LOS: 3 days   Sharyn Creamer 11/10/2022, 10:28 AM

## 2022-11-10 NOTE — Evaluation (Signed)
Occupational Therapy Evaluation and Discharge Patient Details Name: Jonathan Keith MRN: [id number] DOB: 1955-08-25 Today's Date: 11/10/2022   History of Present Illness 67 y.o. male presents to [facility] hospital on 11/07/2022 with 3 day onset of fever, abdominal pain and weakness. Pt admitted for management of sepsis 2.2 biliary obstruction. Pt underwent ERCP with removal of one stent on 4/1. Plan for biliary drain placement 4/2. PMH includes metastatic cholangiocarcinoma, HLD,CAD< CKD4, LV thrombosis, AOCD.   Clinical Impression   This 67 yo male admitted for above presents to acute OT with all education completed, pt has needed DME at home and all needed A from family prn Acute OT will sign off.      Recommendations for follow up therapy are one component of a multi-disciplinary discharge planning process, led by the attending physician.  Recommendations may be updated based on patient status, additional functional criteria and insurance authorization.      Patient can return home with the following A little help with bathing/dressing/bathroom;Assistance with cooking/housework;Help with stairs or ramp for entrance;Assist for transportation    Functional Status Assessment  Patient has had a recent decline in their functional status and demonstrates the ability to make significant improvements in function in a reasonable and predictable amount of time. (without further need for skilled OT intervention, all education completed.)  Equipment Recommendations  None recommended by OT       Precautions / Restrictions Precautions Precautions: Fall Restrictions Weight Bearing Restrictions: No      Mobility Bed Mobility Overal bed mobility: Modified Independent                  Transfers Overall transfer level: Needs assistance Equipment used: Rolling walker (2 wheels) Transfers: Sit to/from Stand Sit to Stand: Min guard           General transfer comment: no LOB this PM with OT  with sit<>stand x4      Balance Overall balance assessment: Mild deficits observed, not formally tested                                         ADL either performed or assessed with clinical judgement   ADL                                         General ADL Comments: set up with intermittent S with minguard A whenever he is up on his feet with RW. Spoke to pt and family (wife and dtr) in room that even though pt does not typically use the shower seat to sit on to shower he may want to consider actually using it to begin with when first home due to it takes less energy to do something sitting than standing. Wife and dtr mentioned that they didn't realize this but when they thought about standing to do something period of time they did realize it was harder on their bodies (espcially bad backs). Recommended to them that they get a bar type stool to prop on when standing at sink to wash dishes or at stove cooking.     Vision Patient Visual Report: No change from baseline              Pertinent Vitals/Pain Pain Assessment Pain Assessment: No/denies pain     Hand Dominance  Right   Extremity/Trunk Assessment Upper Extremity Assessment Upper Extremity Assessment: Overall WFL for tasks assessed           Communication Communication Communication: HOH   Cognition Arousal/Alertness: Awake/alert Behavior During Therapy: WFL for tasks assessed/performed Overall Cognitive Status: Within Functional Limits for tasks assessed                                                  Home Living Family/patient expects to be discharged to:: Private residence Living Arrangements: Spouse/significant other Available Help at Discharge: Family;Available 24 hours/day Type of Home: House Home Access: Ramped entrance     Home Layout: One level     Bathroom Shower/Tub: Occupational psychologist: Handicapped height     Home  Equipment: Conservation officer, nature (2 wheels);Cane - single point;BSC/3in1;Wheelchair - manual;Shower seat;Hand held shower head;Grab bars - tub/shower;Grab bars - toilet   Additional Comments: hospital bed out in the building      Prior Functioning/Environment Prior Level of Function : Independent/Modified Independent               ADLs Comments: wife does report she assists pt with his socks at home but otherwise he does his on BADLs with use of AD or AE        OT Problem List: Impaired balance (sitting and/or standing)         OT Goals(Current goals can be found in the care plan section) Acute Rehab OT Goals Patient Stated Goal: to go home sooner than later         AM-PAC OT "6 Clicks" Daily Activity     Outcome Measure Help from another person eating meals?: None Help from another person taking care of personal grooming?: A Little Help from another person toileting, which includes using toliet, bedpan, or urinal?: A Little Help from another person bathing (including washing, rinsing, drying)?: A Little Help from another person to put on and taking off regular upper body clothing?: A Little Help from another person to put on and taking off regular lower body clothing?: A Little 6 Click Score: 19   End of Session Equipment Utilized During Treatment: Rolling walker (2 wheels)  Activity Tolerance: Patient tolerated treatment well Patient left: in bed;with call bell/phone within reach;with bed alarm set  OT Visit Diagnosis: Unsteadiness on feet (R26.81);Other abnormalities of gait and mobility (R26.89);Muscle weakness (generalized) (M62.81)                Time: [id number] OT Time Calculation (min): 19 min Charges:  OT General Charges $OT Visit: 1 Visit OT Evaluation $OT Eval Moderate Complexity: [place] Office [phone]    Almon Register 11/10/2022, 2:12 PM

## 2022-11-10 NOTE — Progress Notes (Signed)
[facility]    Date of Admission:  11/07/2022   Total days of antibiotics 4/day 3 of unasyn         ID: Jonathan Keith is a 67 y.o. male with   Principal Problem:   Metastatic cholangiocarcinoma to bile duct Active Problems:   Mixed hyperlipidemia   Transaminitis   Leukocytosis   Hyponatremia   GERD (gastroesophageal reflux disease)   Hypoalbuminemia due to protein-calorie malnutrition   Sepsis   Hyperbilirubinemia   Dehydration   Nausea and vomiting   Ischemic cardiomyopathy   Acute kidney injury superimposed on chronic kidney disease   Biliary obstruction   Lactic acidosis   Prediabetes   Malignant ascites   Abdominal distention   Gram-negative bacteremia   Cholangitis   Bile duct stricture    Subjective: He reports improvement with abdominal discomfort. No difficulty with eating  On labwork: trending down slightly  Medications:   Chlorhexidine Gluconate Cloth  6 each Topical Daily   diclofenac  100 mg Rectal Once   feeding supplement  237 mL Oral TID BM   multivitamin with minerals  1 tablet Oral Daily   pantoprazole (PROTONIX) IV  40 mg Intravenous Q24H   sodium chloride flush  10-40 mL Intracatheter Q12H    Objective: Vital signs in last 24 hours: Temp:  [97.6 F (36.4 C)-98 F (36.7 C)] 97.6 F (36.4 C) (04/02 0744) Pulse Rate:  [74-80] 74 (04/02 0744) Resp:  [14-18] 18 (04/02 0744) BP: (89-101)/(61-72) 101/72 (04/02 0744) SpO2:  [96 %-99 %] 97 % (04/02 0744)  Physical Exam  Constitutional: He is oriented to person, place, and time. He appears well-developed and well-nourished. No distress.  HENT:  Mouth/Throat: Oropharynx is clear and moist. No oropharyngeal exudate.  Cardiovascular: Normal rate, regular rhythm and normal heart sounds. Exam reveals no gallop and no friction rub.  No murmur heard.  Pulmonary/Chest: Effort normal and breath sounds normal. No respiratory distress. He has no wheezes.  Abdominal: Soft.  Bowel sounds are normal. He exhibits no distension. There is no tenderness.  Lymphadenopathy:  He has no cervical adenopathy.  Neurological: He is alert and oriented to person, place, and time.  Skin: Skin is warm and dry. No rash noted. No erythema.  Psychiatric: He has a normal mood and affect. His behavior is normal.    Lab Results Recent Labs    11/09/22 0343 11/10/22 0855  WBC 7.6 8.3  HGB 8.3* 8.3*  HCT 23.8* 23.5*  NA 129* 131*  K 3.6 4.2  CL 99 102  CO2 21* 20*  BUN 34* 44*  CREATININE 2.28* 2.92*   Liver Panel Recent Labs    11/09/22 0343 11/10/22 0855  PROT 4.9* 4.7*  ALBUMIN <1.5* <1.5*  AST 108* 93*  ALT 62* 68*  ALKPHOS 298* 348*  BILITOT 2.6* 2.1*   Sedimentation Rate No results for input(s): "ESRSEDRATE" in the last 72 hours. C-Reactive Protein No results for input(s): "CRP" in the last 72 hours.  Microbiology:  Studies/Results: DG ERCP  Result Date: 11/09/2022 CLINICAL DATA:  ERCP EXAM: ERCP TECHNIQUE: Multiple spot images obtained with the fluoroscopic device and submitted for interpretation post-procedure. FLUOROSCOPY: Refer to separate report COMPARISON:  None Available. FINDINGS: A total of 13 fluoroscopic spot images taken during ERCP are submitted for review. Initial images demonstrate a scope overlying the upper abdomen. A plastic biliary stent appears to course through a metallic biliary stent. The plastic biliary stent is removed. Wire catheterization of  the common bile duct is performed. Contrast injection opacifies the intrahepatic and extrahepatic biliary tree. The intrahepatic biliary ducts appear normal in caliber. Balloon sweeps are performed through the common bile duct. Final images demonstrate decompression of the intrahepatic biliary tree with passage of contrast material into the small bowel. IMPRESSION: ERCP images as described. Refer to separate procedure report for full details. These images were submitted for radiologic  interpretation only. Please see the procedural report for the amount of contrast and the fluoroscopy time utilized. Electronically Signed   By: Albin Felling M.D.   On: 11/09/2022 14:54     Assessment/Plan:  Enterococcal bacteremia related to cholangitis = continue on amp/sub. Will start with getting TTE to evaluated for endocarditis and repeat blood cx to ensure bacteremia is clearing.thus far repeat blood cx NGTD at 24hrs.  Cholangitis = in the watch and wait stage as tbili slowly decreasing  Acute on chronic kidney disease= was npo majority of the day yesterday. Would reassess tomorrow's cr to see if any other improvement.  [facility] Pager: [phone]  11/10/2022, 4:36 PM

## 2022-11-10 NOTE — Progress Notes (Signed)
Echocardiogram 2D Echocardiogram has been performed.  [name] RDCS 11/10/2022, 3:14 PM

## 2022-11-10 NOTE — Progress Notes (Addendum)
  Unresectable cholangiocarcinoma Pt was scheduled today for Biliary drain placement Secondary biliary obstruction ERCP with stent removal 4/1  TBili trending down today per Dr Annamaria Boots He discussed with GI about possible need to HOLD Bili drain at this time Watch LFTS  Per Dr  Fuller Plan and Dr Rush Landmark:  monitor LFTs and symptoms and reconsider PTC if LFTs do not continue to improve.   IR will hold for now Please watch labs Let us know if you have need for IR

## 2022-11-10 NOTE — Care Management Important Message (Signed)
Important Message  Patient Details  Name: Jonathan Keith MRN: [id number] Date of Birth: 07-Jun-1956   Medicare Important Message Given:  Yes     Hannah Beat 11/10/2022, 11:33 AM

## 2022-11-10 NOTE — Plan of Care (Signed)
  Problem: Education: Goal: Knowledge of General Education information will improve Description: Including pain rating scale, medication(s)/side effects and non-pharmacologic comfort measures Outcome: Not Progressing   Problem: Health Behavior/Discharge Planning: Goal: Ability to manage health-related needs will improve Outcome: Not Progressing   Problem: Clinical Measurements: Goal: Ability to maintain clinical measurements within normal limits will improve Outcome: Not Progressing Goal: Will remain free from infection Outcome: Not Progressing Goal: Diagnostic test results will improve Outcome: Not Progressing Goal: Respiratory complications will improve Outcome: Not Progressing Goal: Cardiovascular complication will be avoided Outcome: Not Progressing   Problem: Coping: Goal: Level of anxiety will decrease Outcome: Not Progressing   Problem: Elimination: Goal: Will not experience complications related to bowel motility Outcome: Not Progressing Goal: Will not experience complications related to urinary retention Outcome: Not Progressing   Problem: Pain Managment: Goal: General experience of comfort will improve Outcome: Not Progressing   

## 2022-11-10 NOTE — Evaluation (Signed)
Physical Therapy Evaluation Patient Details Name: Jonathan Keith MRN: [id number] DOB: July 31, 1956 Today's Date: 11/10/2022  History of Present Illness  67 y.o. male presents to [facility] hospital on 11/07/2022 with 3 day onset of fever, abdominal pain and weakness. Pt admitted for management of sepsis 2.2 biliary obstruction. Pt underwent ERCP with removal of one stent on 4/1. Plan for biliary drain placement 4/2. PMH includes metastatic cholangiocarcinoma, HLD,CAD< CKD4, LV thrombosis, AOCD.  Clinical Impression  Pt presents to PT with deficits in strength, power, endurance, gait, balance. Pt is limited by generalized weakness, benefiting from UE support of RW to improve stability in standing and when ambulating. Pt reports fatiguing quickly at this time. PT encourages frequent mobilization with staff assistance in an effort to improve strength and endurance.       Recommendations for follow up therapy are one component of a multi-disciplinary discharge planning process, led by the attending physician.  Recommendations may be updated based on patient status, additional functional criteria and insurance authorization.  Follow Up Recommendations       Assistance Recommended at Discharge PRN  Patient can return home with the following  A little help with bathing/dressing/bathroom;Assistance with cooking/housework;Assist for transportation;Help with stairs or ramp for entrance    Equipment Recommendations None recommended by PT (owns necessary DME)  Recommendations for Other Services       Functional Status Assessment Patient has had a recent decline in their functional status and demonstrates the ability to make significant improvements in function in a reasonable and predictable amount of time.     Precautions / Restrictions Precautions Precautions: Fall Restrictions Weight Bearing Restrictions: No      Mobility  Bed Mobility Overal bed mobility: Modified Independent              General bed mobility comments: increased time    Transfers Overall transfer level: Needs assistance Equipment used: Rolling walker (2 wheels) Transfers: Sit to/from Stand Sit to Stand: Min guard           General transfer comment: pt stands without DME but quickly loses balance posteriorly and returns to sitting. Benefits from  UE support at this time    Ambulation/Gait Ambulation/Gait assistance: Supervision Gait Distance (Feet): 250 Feet Assistive device: Rolling walker (2 wheels) Gait Pattern/deviations: Step-through pattern Gait velocity: functional Gait velocity interpretation: 1.31 - 2.62 ft/sec, indicative of limited community ambulator   General Gait Details: slowed step-through gait with UE support of RW  Stairs            Wheelchair Mobility    Modified Rankin (Stroke Patients Only)       Balance Overall balance assessment: Needs assistance Sitting-balance support: No upper extremity supported, Feet supported Sitting balance-Leahy Scale: Good     Standing balance support: Single extremity supported, Reliant on assistive device for balance Standing balance-Leahy Scale: Poor                               Pertinent Vitals/Pain Pain Assessment Pain Assessment: No/denies pain    Home Living Family/patient expects to be discharged to:: Private residence Living Arrangements: Spouse/significant other Available Help at Discharge: Family;Available 24 hours/day Type of Home: House Home Access: Ramped entrance       Home Layout: One level Home Equipment: Conservation officer, nature (2 wheels);Cane - single point;BSC/3in1;Wheelchair - manual      Prior Function Prior Level of Function : Independent/Modified Independent  Hand Dominance        Extremity/Trunk Assessment   Upper Extremity Assessment Upper Extremity Assessment: Overall WFL for tasks assessed    Lower Extremity Assessment Lower Extremity Assessment:  Generalized weakness    Cervical / Trunk Assessment Cervical / Trunk Assessment: Normal  Communication   Communication: No difficulties  Cognition Arousal/Alertness: Awake/alert Behavior During Therapy: WFL for tasks assessed/performed Overall Cognitive Status: Within Functional Limits for tasks assessed                                          General Comments General comments (skin integrity, edema, etc.): VSS on RA    Exercises     Assessment/Plan    PT Assessment Patient needs continued PT services  PT Problem List Decreased strength;Decreased activity tolerance;Decreased balance;Decreased mobility;Decreased knowledge of use of DME       PT Treatment Interventions DME instruction;Gait training;Functional mobility training;Therapeutic activities;Therapeutic exercise;Balance training;Neuromuscular re-education;Patient/family education    PT Goals (Current goals can be found in the Care Plan section)  Acute Rehab PT Goals Patient Stated Goal: to return to independence PT Goal Formulation: With patient Time For Goal Achievement: 11/24/22 Potential to Achieve Goals: Good Additional Goals Additional Goal #1: Pt will score >19/24 on the DGI to indicate a reduced risk for falls    Frequency Min 3X/week     Co-evaluation               AM-PAC PT "6 Clicks" Mobility  Outcome Measure Help needed turning from your back to your side while in a flat bed without using bedrails?: None Help needed moving from lying on your back to sitting on the side of a flat bed without using bedrails?: None Help needed moving to and from a bed to a chair (including a wheelchair)?: A Little Help needed standing up from a chair using your arms (e.g., wheelchair or bedside chair)?: A Little Help needed to walk in hospital room?: A Little Help needed climbing 3-5 steps with a railing? : A Little 6 Click Score: 20    End of Session   Activity Tolerance: Patient tolerated  treatment well Patient left: in chair;with call bell/phone within reach;with chair alarm set Nurse Communication: Mobility status PT Visit Diagnosis: Other abnormalities of gait and mobility (R26.89);Muscle weakness (generalized) (M62.81)    Time: [id number] PT Time Calculation (min) (ACUTE ONLY): 20 min   Charges:   PT Evaluation $PT Eval Low Complexity: Outlook, PT, DPT Acute Rehabilitation Office [phone]   Zenaida Niece 11/10/2022, 10:01 AM

## 2022-11-11 ENCOUNTER — Ambulatory Visit: Payer: Medicare Other

## 2022-11-11 ENCOUNTER — Ambulatory Visit: Payer: Medicare Other | Admitting: Hematology

## 2022-11-11 ENCOUNTER — Other Ambulatory Visit: Payer: Medicare Other

## 2022-11-11 DIAGNOSIS — K831 Obstruction of bile duct: Secondary | ICD-10-CM | POA: Diagnosis not present

## 2022-11-11 DIAGNOSIS — C7889 Secondary malignant neoplasm of other digestive organs: Secondary | ICD-10-CM | POA: Diagnosis not present

## 2022-11-11 DIAGNOSIS — E46 Unspecified protein-calorie malnutrition: Secondary | ICD-10-CM

## 2022-11-11 DIAGNOSIS — N179 Acute kidney failure, unspecified: Secondary | ICD-10-CM | POA: Diagnosis not present

## 2022-11-11 DIAGNOSIS — A4150 Gram-negative sepsis, unspecified: Secondary | ICD-10-CM | POA: Diagnosis not present

## 2022-11-11 DIAGNOSIS — R140 Abdominal distension (gaseous): Secondary | ICD-10-CM | POA: Diagnosis not present

## 2022-11-11 DIAGNOSIS — C221 Intrahepatic bile duct carcinoma: Secondary | ICD-10-CM | POA: Diagnosis not present

## 2022-11-11 DIAGNOSIS — A419 Sepsis, unspecified organism: Secondary | ICD-10-CM | POA: Diagnosis not present

## 2022-11-11 DIAGNOSIS — E8809 Other disorders of plasma-protein metabolism, not elsewhere classified: Secondary | ICD-10-CM

## 2022-11-11 LAB — CBC
HCT: 24.9 % — ABNORMAL LOW (ref 39.0–52.0)
Hemoglobin: 8.7 g/dL — ABNORMAL LOW (ref 13.0–17.0)
MCH: 33.5 pg (ref 26.0–34.0)
MCHC: 34.9 g/dL (ref 30.0–36.0)
MCV: 95.8 fL (ref 80.0–100.0)
Platelets: 74 10*3/uL — ABNORMAL LOW (ref 150–400)
RBC: 2.6 MIL/uL — ABNORMAL LOW (ref 4.22–5.81)
RDW: 16.8 % — ABNORMAL HIGH (ref 11.5–15.5)
WBC: 12 10*3/uL — ABNORMAL HIGH (ref 4.0–10.5)
nRBC: 0 % (ref 0.0–0.2)

## 2022-11-11 LAB — URINALYSIS, ROUTINE W REFLEX MICROSCOPIC
Bilirubin Urine: NEGATIVE
Glucose, UA: NEGATIVE mg/dL
Hgb urine dipstick: NEGATIVE
Ketones, ur: NEGATIVE mg/dL
Nitrite: NEGATIVE
Protein, ur: 30 mg/dL — AB
Specific Gravity, Urine: 1.023 (ref 1.005–1.030)
pH: 5 (ref 5.0–8.0)

## 2022-11-11 LAB — COMPREHENSIVE METABOLIC PANEL
ALT: 86 U/L — ABNORMAL HIGH (ref 0–44)
AST: 112 U/L — ABNORMAL HIGH (ref 15–41)
Albumin: <1.5 g/dL — ABNORMAL LOW (ref 3.5–5.0)
Alkaline Phosphatase: 387 U/L — ABNORMAL HIGH (ref 38–126)
Anion gap: 11 (ref 5–15)
BUN: 51 mg/dL — ABNORMAL HIGH (ref 8–23)
CO2: 20 mmol/L — ABNORMAL LOW (ref 22–32)
Calcium: 8 mg/dL — ABNORMAL LOW (ref 8.9–10.3)
Chloride: 101 mmol/L (ref 98–111)
Creatinine, Ser: 3.61 mg/dL — ABNORMAL HIGH (ref 0.61–1.24)
GFR, Estimated: 18 mL/min — ABNORMAL LOW (ref >60)
Glucose, Bld: 167 mg/dL — ABNORMAL HIGH (ref 70–99)
Potassium: 4.1 mmol/L (ref 3.5–5.1)
Sodium: 132 mmol/L — ABNORMAL LOW (ref 135–145)
Total Bilirubin: 2 mg/dL — ABNORMAL HIGH (ref 0.3–1.2)
Total Protein: 4.8 g/dL — ABNORMAL LOW (ref 6.5–8.1)

## 2022-11-11 LAB — SODIUM, URINE, RANDOM: Sodium, Ur: 20 mmol/L

## 2022-11-11 LAB — CREATININE, URINE, RANDOM: Creatinine, Urine: 151 mg/dL

## 2022-11-11 LAB — TOTAL BILIRUBIN, BODY FLUID: Total bilirubin, fluid: 0.5 mg/dL

## 2022-11-11 MED ORDER — SODIUM CHLORIDE 0.9 % IV SOLN
3.0000 g | Freq: Two times a day (BID) | INTRAVENOUS | Status: DC
  Administered 2022-11-11 – 2022-11-15 (×10): 3 g via INTRAVENOUS
  Filled 2022-11-11 (×10): qty 8

## 2022-11-11 NOTE — Progress Notes (Signed)
PHARMACY NOTE:  ANTIMICROBIAL RENAL DOSAGE ADJUSTMENT  Current antimicrobial regimen includes a mismatch between antimicrobial dosage and estimated renal function.  As per policy approved by the Pharmacy & Therapeutics and Medical Executive Committees, the antimicrobial dosage will be adjusted accordingly.  Current antimicrobial dosage:  Unasyn 3g IV q8h  Indication: enterococcal bacteremia / cholangitis  Renal Function:  Estimated Creatinine Clearance: 21.4 mL/min (A) (by C-G formula based on SCr of 3.61 mg/dL (H)). []      On intermittent HD, scheduled: []      On CRRT    Antimicrobial dosage has been changed to:  Unasyn 3g IV q12h   Thank you for allowing pharmacy to be a part of this patient's care.  Candie Mile, [facility] 11/11/2022 8:09 AM

## 2022-11-11 NOTE — Progress Notes (Signed)
Interventional Radiology Brief Note:  IR following for possible biliary drain placement.   Labs reviewed today.  Tbili remains stable at 2.0.  LFTs overall stable, slightly increase today.  GI following. No plans for intervention today.   Hospitalist has consulted Nephrology due to worsening Cr/BUN.   Continue to monitor.   Brynda Greathouse, MS RD PA-C

## 2022-11-11 NOTE — Plan of Care (Signed)
  Problem: Education: Goal: Knowledge of General Education information will improve Description Including pain rating scale, medication(s)/side effects and non-pharmacologic comfort measures Outcome: Progressing   Problem: Nutrition: Goal: Adequate nutrition will be maintained Outcome: Progressing   Problem: Pain Managment: Goal: General experience of comfort will improve Outcome: Progressing   

## 2022-11-11 NOTE — Progress Notes (Signed)
[facility]    Date of Admission:  11/07/2022   Total days of antibiotics 5          ID: Jonathan Keith is a 67 y.o. male with  enterococcal bacteremia 2/2 cholangitis Principal Problem:   Metastatic cholangiocarcinoma to bile duct Active Problems:   Mixed hyperlipidemia   Transaminitis   Leukocytosis   Hyponatremia   GERD (gastroesophageal reflux disease)   Hypoalbuminemia due to protein-calorie malnutrition   Sepsis   Hyperbilirubinemia   Dehydration   Nausea and vomiting   Ischemic cardiomyopathy   Acute kidney injury superimposed on chronic kidney disease   Biliary obstruction   Lactic acidosis   Prediabetes   Malignant ascites   Abdominal distention   Gram-negative bacteremia   Cholangitis   Bile duct stricture    Subjective: Afebrile, continues to tolerating eating  Labs: showing worsening cr  Medications:   Chlorhexidine Gluconate Cloth  6 each Topical Daily   diclofenac  100 mg Rectal Once   feeding supplement  237 mL Oral TID BM   multivitamin with minerals  1 tablet Oral Daily   pantoprazole (PROTONIX) IV  40 mg Intravenous Q24H   sodium chloride flush  10-40 mL Intracatheter Q12H    Objective: Vital signs in last 24 hours: Temp:  [97.7 F (36.5 C)-98 F (36.7 C)] 97.7 F (36.5 C) (04/03 1443) Pulse Rate:  [75-82] 78 (04/03 1443) Resp:  [14-17] 16 (04/03 1443) BP: (96-113)/(61-70) 113/67 (04/03 1443) SpO2:  [97 %-100 %] 99 % (04/03 1443)  Physical Exam  Constitutional: He is oriented to person, place, and time. He appears well-developed and well-nourished. No distress.  HENT:  Mouth/Throat: Oropharynx is clear and moist. No oropharyngeal exudate.  Cardiovascular: Normal rate, regular rhythm and normal heart sounds. Exam reveals no gallop and no friction rub.  No murmur heard.  Pulmonary/Chest: Effort normal and breath sounds normal. No respiratory distress. He has no wheezes.  Abdominal: Soft. Bowel sounds are  normal. He exhibits no distension. There is no tenderness.  Lymphadenopathy:  He has no cervical adenopathy.  Neurological: He is alert and oriented to person, place, and time.  Skin: Skin is warm and dry. No rash noted. No erythema.  Psychiatric: He has a normal mood and affect. His behavior is normal.    Lab Results Recent Labs    11/10/22 0855 11/11/22 0550  WBC 8.3 12.0*  HGB 8.3* 8.7*  HCT 23.5* 24.9*  NA 131* 132*  K 4.2 4.1  CL 102 101  CO2 20* 20*  BUN 44* 51*  CREATININE 2.92* 3.61*   Liver Panel Recent Labs    11/10/22 0855 11/11/22 0550  PROT 4.7* 4.8*  ALBUMIN <1.5* <1.5*  AST 93* 112*  ALT 68* 86*  ALKPHOS 348* 387*  BILITOT 2.1* 2.0*    Microbiology: 3/30 blood cx enterococcus faecalis 4/1 blood cx ngtd Studies/Results: ECHOCARDIOGRAM COMPLETE  Result Date: 11/10/2022    ECHOCARDIOGRAM REPORT   Patient Name:   Jonathan Keith Date of Exam: 11/10/2022 Medical Rec #:  [id number]       Height:       71.0 in Accession #:    [id number]      Weight:       167.1 lb Date of Birth:  [birth year]       BSA:          1.954 m Patient Age:    18 years  BP:           101/72 mmHg Patient Gender: M               HR:           74 bpm. Exam Location:  Inpatient Procedure: 2D Echo, Color Doppler, Cardiac Doppler and Intracardiac            Opacification Agent Indications:    Bacteremia  History:        Patient has prior history of Echocardiogram examinations, most                 recent 09/14/2022. CHF, CAD; Risk Factors:Hypertension, Diabetes                 and Dyslipidemia.  Sonographer:    Raquel Sarna Senior RDCS Referring Phys: [phone] RALPH A NETTEY  Sonographer Comments: Technically difficult due to significantly more lung interference than prior studies. IMPRESSIONS  1. Left ventricular ejection fraction, by estimation, is 40 to 45%. The left ventricle has mildly decreased function. The left ventricle demonstrates regional wall motion abnormalities (see scoring diagram/findings for  description). Left ventricular diastolic parameters are consistent with Grade I diastolic dysfunction (impaired relaxation).  2. Right ventricular systolic function is normal. The right ventricular size is normal.  3. The mitral valve is grossly normal. Trivial mitral valve regurgitation.  4. The aortic valve is grossly normal. Aortic valve regurgitation is not visualized. Comparison(s): No significant change from prior study. Conclusion(s)/Recommendation(s): No evidence of valvular vegetations on this transthoracic echocardiogram. Consider a transesophageal echocardiogram to exclude infective endocarditis if clinically indicated. FINDINGS  Left Ventricle: Left ventricular ejection fraction, by estimation, is 40 to 45%. The left ventricle has mildly decreased function. The left ventricle demonstrates regional wall motion abnormalities. Definity contrast agent was given IV to delineate the left ventricular endocardial borders. The left ventricular internal cavity size was normal in size. There is no left ventricular hypertrophy. Left ventricular diastolic parameters are consistent with Grade I diastolic dysfunction (impaired relaxation).  LV Wall Scoring: The basal inferior segment is aneurysmal. The basal inferolateral segment is dyskinetic. The mid inferolateral segment, mid inferoseptal segment, mid inferior segment, and basal inferoseptal segment are hypokinetic. The entire anterior wall, antero-lateral wall, entire anterior septum, and entire apex are normal. Right Ventricle: The right ventricular size is normal. Right vetricular wall thickness was not well visualized. Right ventricular systolic function is normal. Left Atrium: Left atrial size was normal in size. Right Atrium: Right atrial size was normal in size. Pericardium: There is no evidence of pericardial effusion. Mitral Valve: The mitral valve is grossly normal. Trivial mitral valve regurgitation. Tricuspid Valve: The tricuspid valve is not well  visualized. Tricuspid valve regurgitation is not demonstrated. Aortic Valve: The aortic valve is grossly normal. Aortic valve regurgitation is not visualized. Pulmonic Valve: The pulmonic valve was not well visualized. Pulmonic valve regurgitation is not visualized. Aorta: The aortic root was not well visualized, the ascending aorta was not well visualized and the aortic arch was not well visualized. IAS/Shunts: No atrial level shunt detected by color flow Doppler.   LV Volumes (MOD) LV vol d, MOD A2C: 191.0 ml Diastology LV vol d, MOD A4C: 143.0 ml LV e' medial:    4.35 cm/s LV vol s, MOD A2C: 119.0 ml LV E/e' medial:  14.5 LV vol s, MOD A4C: 70.2 ml  LV e' lateral:   4.24 cm/s LV SV MOD A2C:     72.0 ml  LV E/e' lateral: 14.9  LV SV MOD A4C:     143.0 ml LV SV MOD BP:      73.5 ml RIGHT VENTRICLE RV S prime:     12.90 cm/s TAPSE (M-mode): 2.8 cm LEFT ATRIUM             Index        RIGHT ATRIUM           Index LA Vol (A2C):   47.1 ml 24.11 ml/m  RA Area:     11.20 cm LA Vol (A4C):   41.9 ml 21.45 ml/m  RA Volume:   21.60 ml  11.06 ml/m LA Biplane Vol: 48.1 ml 24.62 ml/m  AORTIC VALVE LVOT Vmax:   81.60 cm/s LVOT Vmean:  67.000 cm/s LVOT VTI:    0.179 m MITRAL VALVE MV Area (PHT): 4.77 cm    SHUNTS MV Decel Time: 159 msec    Systemic VTI: 0.18 m MV E velocity: 63.00 cm/s MV A velocity: 73.70 cm/s MV E/A ratio:  0.85 Rudean Haskell MD Electronically signed by Rudean Haskell MD Signature Date/Time: 11/10/2022/6:02:05 PM    Final      Assessment/Plan: Enterococcal bacteremia = continue with amp/sub and will discuss how to transition to orals when ready for discharge.  TTE did not show any signs of vegetation.  Acute on chronic kidney disease = unclear if due to being intravascularly depletion. Recommend to evaluate for ATN and see if nephrology can make recommendations   Transaminitis = slighly increased. Continue to monitor  [facility] Pager:  [phone]  11/11/2022, 2:47 PM

## 2022-11-11 NOTE — Progress Notes (Signed)
PROGRESS NOTE    Jonathan Keith  [id number] DOB: 13-Apr-1956 DOA: 11/07/2022 PCP: Redmond School, MD   Brief Narrative: Jonathan Keith is a 67 y.o. male with a history of metastatic cholangiocarcinoma with biliary obstruction s/p stent placement, hyperlipidemia, CAD, ischemic cardiomyopathy, CHF, AKI on CKD stage IV, anemia of chronic disease presented hospital with fever and weakness and was noted to have sepsis secondary to biliary obstruction and SBP.  Workup revealed enterococcal bacteremia.  GI and ID on board.  ERCP was done for removal of plastic biliary stent by GI 11/09/2022. Plan for percutaneous biliary drain as per IR   Assessment and Plan:  Sepsis likely secondary to biliary obstruction and cholangitis. Currently on IV antibiotic with Unasyn.  Had undergone paracentesis on 11/08/2022 with removal of 1.2 L of clear yellow fluid.Patient with known biliary obstruction secondary to known cholangiocarcinoma with recent biliary stent placement. Concern for biliary stent malfunction. GI was consulted and performed ERCP on 4/1, patient underwent removal of plastic stent with biliary tree sweep and removal of sludge. IR consulted for  percutaneous bile duct drain placement while getting antibiotics.  Ascetic fluid Gram stain, culture negative.  Absolute PMN count  not upto the criteria for SBP.  Enterococcus faecalis bacteremia ID was consulted and patient is on Unasyn. Repeat blood cultures (4/1) with no growth to date.  2D echocardiogram on 11/10/2022 shows LV ejection fraction of 40 to 45% with regional wall motion abnormality and grade 1 diastolic dysfunction.  Malignant ascites/Metastatic cholangiocarcinoma On Unasyn.   Status post paracentesis on 11/08/2022 with removal of 1.2 L of fluid.  Nausea/vomiting Improved.  Continue Zofran..  AKI on CKD stage IV Baseline creatinine of 1.4-1.6. Creatinine of 2.90 on admission.  Creatinine today at 3.6.  Continue to monitor closely.   Strict intake and output charting, Daily weights.  Will get urinary sodium and creatinine levels.  Patient has had total negative balance of 1713 mL with a 1500 mL total output in 24 hours yesterday.  Poor oral intake.  Will consult nephrology for further opinion.  Will get Fena levels.  Might benefit from hydration but will be cautious in the context of ascites.  Acute on chronic hyponatremia Sodium was as low at 124 on presentation.  Currently at 132.  CAD with history of Ischemic cardiomyopathy Borderline low.  Coreg and lisinopril on hold.  2D echocardiogram on 11/10/2022 showed LV ejection fraction of 40 to 45%.  Not on diuretic at this time.  Prediabetes Latest Hemoglobin A1C of 5.8%.  GERD Continue Protonix  Increased nutrient needs Noted by dietitian. No diagnosis of malnutrition.  Continue Ensure and multivitamins.   DVT prophylaxis: SCDs  Code Status:   Code Status: Full Code  Family Communication:   Spoke with the patient's wife at bedside.  Disposition Plan: Home likely in 2-3 days, follow ID recommendations on antibiotic.  Rising creatinine levels.  Will get nephrology evaluation.   Consultants:  [facility] gastro[facility]ology Infectious disease Interventional radiology Nephrology.  Procedures:  4/1: ERCP  Antimicrobials: Unasyn IV.  Renally dosed   Subjective: Today, patient was seen and examined at bedside.  Patient denies any nausea vomiting or abdominal pain reported as poor oral intake..  States that he has been movement is okay.  Spoke with the patient's wife at bedside.  Noted to have rising creatinine levels.  Objective: BP 103/67 (BP Location: Left Arm)   Pulse 82   Temp 97.7 F (36.5 C) (Oral)   Resp 17   Ht  5\' 11"  (1.803 m)   Wt 75.8 kg   SpO2 97%   BMI 23.31 kg/m   Physical examination:  General:  Average built, not in obvious distress HENT:   No scleral pallor or icterus noted. Oral mucosa is moist.  Chest:  Clear breath sounds.  No  crackles or wheezes.  CVS: S1 &S2 heard. No murmur.  Regular rate and rhythm. Abdomen: Soft, nontender, distended with ascites bowel sounds are heard.  Foley catheter in place. Extremities: No cyanosis, clubbing or edema.  Peripheral pulses are palpable. Psych: Alert, awake and Communicative, flat affect. CNS:  No cranial nerve deficits.  Moves all extremities. Skin: Warm and dry.  No rashes noted.  Data Reviewed: I have reviewed the following labs and imaging studies.     CBC Lab Results  Component Value Date   WBC 12.0 (H) 11/11/2022   RBC 2.60 (L) 11/11/2022   HGB 8.7 (L) 11/11/2022   HCT 24.9 (L) 11/11/2022   MCV 95.8 11/11/2022   MCH 33.5 11/11/2022   PLT 74 (L) 11/11/2022   MCHC 34.9 11/11/2022   RDW 16.8 (H) 11/11/2022   LYMPHSABS 0.6 (L) 11/07/2022   MONOABS 1.9 (H) 11/07/2022   EOSABS 0.0 11/07/2022   BASOSABS 0.0 [id number]     Last metabolic panel Lab Results  Component Value Date   NA 132 (L) 11/11/2022   K 4.1 11/11/2022   CL 101 11/11/2022   CO2 20 (L) 11/11/2022   BUN 51 (H) 11/11/2022   CREATININE 3.61 (H) 11/11/2022   GLUCOSE 167 (H) 11/11/2022   GFRNONAA 18 (L) 11/11/2022   CALCIUM 8.0 (L) 11/11/2022   PHOS 4.0 11/08/2022   PROT 4.8 (L) 11/11/2022   ALBUMIN <1.5 (L) 11/11/2022   LABGLOB 3.1 06/16/2021   AGRATIO 1.2 06/16/2021   BILITOT 2.0 (H) 11/11/2022   ALKPHOS 387 (H) 11/11/2022   AST 112 (H) 11/11/2022   ALT 86 (H) 11/11/2022   ANIONGAP 11 11/11/2022    GFR: Estimated Creatinine Clearance: 21.4 mL/min (A) (by C-G formula based on SCr of 3.61 mg/dL (H)).  Recent Results (from the past 240 hour(s))  Blood Culture (routine x 2)     Status: Abnormal   Collection Time: 11/07/22 12:13 PM   Specimen: Left Antecubital; Blood  Result Value Ref Range Status   Specimen Description   Final    LEFT ANTECUBITAL BOTTLES DRAWN AEROBIC AND ANAEROBIC Performed at [facility], [address] [place], [place] [address]    Special Requests   Final     Blood Culture adequate volume Performed at [facility], [address] [place], [place] [address]    Culture  Setup Time   Final    GRAM POSITIVE COCCI Gram Stain Report Called to,Read Back By and Verified With: LOPEZ,V @ [id number] ON 11/08/22 BY JUW AEROBIC BOTTLE ONLY GS DONE @ [facility] CRITICAL RESULT CALLED TO, READ BACK BY AND VERIFIED WITH: [place] OZ:[id number] AT 0846 BY EC  ANAB REVIEWED BY A. LAFRANCE Performed at [facility], [place] [address] [place], [place] [address]    Culture ENTEROCOCCUS FAECALIS (A)  Final   Report Status 11/10/2022 FINAL  Final   Organism ID, Bacteria ENTEROCOCCUS FAECALIS  Final      Susceptibility   Enterococcus faecalis - MIC*    AMPICILLIN <=2 SENSITIVE Sensitive     VANCOMYCIN 2 SENSITIVE Sensitive     GENTAMICIN SYNERGY SENSITIVE Sensitive     * ENTEROCOCCUS FAECALIS  Blood Culture (routine x  2)     Status: Abnormal   Collection Time: 11/07/22 12:13 PM   Specimen: BLOOD LEFT HAND  Result Value Ref Range Status   Specimen Description   Final    BLOOD LEFT HAND BOTTLES DRAWN AEROBIC AND ANAEROBIC Performed at [facility], [address], [place], [place] [address]    Special Requests   Final    Blood Culture adequate volume Performed at [facility], [address], [place], [place] [address]    Culture  Setup Time   Final    GRAM POSITIVE COCCI Gram Stain Report Called to,Read Back By and Verified With: B.BARNES @ 0841 BY STEPHTR 11/08/22  REVIEWED BY A. LAFRANCE CRITICAL VALUE NOTED.  VALUE IS CONSISTENT WITH PREVIOUSLY REPORTED AND CALLED VALUE. IN BOTH AEROBIC AND ANAEROBIC BOTTLES    Culture (A)  Final    ENTEROCOCCUS FAECALIS SUSCEPTIBILITIES PERFORMED ON PREVIOUS CULTURE WITHIN THE LAST 5 DAYS. Performed at [facility], [place] [address], [place], [place] [address]    Report Status 11/10/2022 FINAL  Final  Blood Culture ID Panel (Reflexed)     Status: Abnormal   Collection Time: 11/07/22 12:13 PM  Result Value Ref  Range Status   Enterococcus faecalis DETECTED (A) NOT DETECTED Final    Comment: CRITICAL RESULT CALLED TO, READ BACK BY AND VERIFIED WITH: PHARMD CATHY PIERCE OZ:[id number] AT 0846 BY EC    Enterococcus Faecium NOT DETECTED NOT DETECTED Final   Listeria monocytogenes NOT DETECTED NOT DETECTED Final   Staphylococcus species NOT DETECTED NOT DETECTED Final   Staphylococcus aureus (BCID) NOT DETECTED NOT DETECTED Final   Staphylococcus epidermidis NOT DETECTED NOT DETECTED Final   Staphylococcus lugdunensis NOT DETECTED NOT DETECTED Final   Streptococcus species NOT DETECTED NOT DETECTED Final   Streptococcus agalactiae NOT DETECTED NOT DETECTED Final   Streptococcus pneumoniae NOT DETECTED NOT DETECTED Final   Streptococcus pyogenes NOT DETECTED NOT DETECTED Final   A.calcoaceticus-baumannii NOT DETECTED NOT DETECTED Final   Bacteroides fragilis NOT DETECTED NOT DETECTED Final   Enterobacterales NOT DETECTED NOT DETECTED Final   Enterobacter cloacae complex NOT DETECTED NOT DETECTED Final   Escherichia coli NOT DETECTED NOT DETECTED Final   Klebsiella aerogenes NOT DETECTED NOT DETECTED Final   Klebsiella oxytoca NOT DETECTED NOT DETECTED Final   Klebsiella pneumoniae NOT DETECTED NOT DETECTED Final   Proteus species NOT DETECTED NOT DETECTED Final   Salmonella species NOT DETECTED NOT DETECTED Final   Serratia marcescens NOT DETECTED NOT DETECTED Final   Haemophilus influenzae NOT DETECTED NOT DETECTED Final   Neisseria meningitidis NOT DETECTED NOT DETECTED Final   Pseudomonas aeruginosa NOT DETECTED NOT DETECTED Final   Stenotrophomonas maltophilia NOT DETECTED NOT DETECTED Final   Candida albicans NOT DETECTED NOT DETECTED Final   Candida auris NOT DETECTED NOT DETECTED Final   Candida glabrata NOT DETECTED NOT DETECTED Final   Candida krusei NOT DETECTED NOT DETECTED Final   Candida parapsilosis NOT DETECTED NOT DETECTED Final   Candida tropicalis NOT DETECTED NOT DETECTED Final    Cryptococcus neoformans/gattii NOT DETECTED NOT DETECTED Final   Vancomycin resistance NOT DETECTED NOT DETECTED Final    Comment: Performed at [facility], [address] [place], [place] [address]  Resp panel by RT-PCR (RSV, Flu A&B, Covid) Anterior Nasal Swab     Status: None   Collection Time: 11/07/22  1:00 PM   Specimen: Anterior Nasal Swab  Result Value Ref Range Status   SARS Coronavirus 2 by RT PCR  NEGATIVE NEGATIVE Final    Comment: (NOTE) SARS-CoV-2 target nucleic acids are NOT DETECTED.  The SARS-CoV-2 RNA is generally detectable in upper respiratory specimens during the acute phase of infection. The lowest concentration of SARS-CoV-2 viral copies this assay can detect is 138 copies/mL. A negative result does not preclude SARS-Cov-2 infection and should not be used as the sole basis for treatment or other patient management decisions. A negative result may occur with  improper specimen collection/handling, submission of specimen other than nasopharyngeal swab, presence of viral mutation(s) within the areas targeted by this assay, and inadequate number of viral copies(<138 copies/mL). A negative result must be combined with clinical observations, patient history, and epidemiological information. The expected result is Negative.  Fact Sheet for Patients:  EntrepreneurPulse.com.au  Fact Sheet for Healthcare Providers:  IncredibleEmployment.be  This test is no t yet approved or cleared by the [place] FDA and  has been authorized for detection and/or diagnosis of SARS-CoV-2 by FDA under an Emergency Use Authorization (EUA). This EUA will remain  in effect (meaning this test can be used) for the duration of the COVID-19 declaration under Section 564(b)(1) of the Act, 21 U.S.C.section 360bbb-3(b)(1), unless the authorization is terminated  or revoked sooner.       Influenza A by PCR NEGATIVE NEGATIVE Final   Influenza B by  PCR NEGATIVE NEGATIVE Final    Comment: (NOTE) The Xpert Xpress SARS-CoV-2/FLU/RSV plus assay is intended as an aid in the diagnosis of influenza from Nasopharyngeal swab specimens and should not be used as a sole basis for treatment. Nasal washings and aspirates are unacceptable for Xpert Xpress SARS-CoV-2/FLU/RSV testing.  Fact Sheet for Patients: EntrepreneurPulse.com.au  Fact Sheet for Healthcare Providers: IncredibleEmployment.be  This test is not yet approved or cleared by the [place] FDA and has been authorized for detection and/or diagnosis of SARS-CoV-2 by FDA under an Emergency Use Authorization (EUA). This EUA will remain in effect (meaning this test can be used) for the duration of the COVID-19 declaration under Section 564(b)(1) of the Act, 21 U.S.C. section 360bbb-3(b)(1), unless the authorization is terminated or revoked.     Resp Syncytial Virus by PCR NEGATIVE NEGATIVE Final    Comment: (NOTE) Fact Sheet for Patients: EntrepreneurPulse.com.au  Fact Sheet for Healthcare Providers: IncredibleEmployment.be  This test is not yet approved or cleared by the [place] FDA and has been authorized for detection and/or diagnosis of SARS-CoV-2 by FDA under an Emergency Use Authorization (EUA). This EUA will remain in effect (meaning this test can be used) for the duration of the COVID-19 declaration under Section 564(b)(1) of the Act, 21 U.S.C. section 360bbb-3(b)(1), unless the authorization is terminated or revoked.  Performed at [facility], [address], [place], [place] [address]   Gram stain     Status: None   Collection Time: 11/08/22  1:34 PM   Specimen: PATH Cytology Peritoneal fluid  Result Value Ref Range Status   Specimen Description PERITONEAL  Final   Special Requests NONE  Final   Gram Stain   Final    RARE WBC PRESENT, PREDOMINANTLY PMN NO ORGANISMS SEEN Performed  at [facility], [place] [address] [place], [place] [address]    Report Status 11/08/2022 FINAL  Final  Culture, body fluid w Gram Stain-bottle     Status: None (Preliminary result)   Collection Time: 11/08/22  1:34 PM   Specimen: Peritoneal Washings  Result Value Ref Range Status   Specimen Description PERITONEAL  Final  Special Requests NONE  Final   Culture   Final    NO GROWTH 3 DAYS Performed at [facility], [place] [address] [place], [place] [address]    Report Status PENDING  Incomplete  Surgical pcr screen     Status: None   Collection Time: 11/09/22  7:09 AM   Specimen: Nasal Mucosa; Nasal Swab  Result Value Ref Range Status   MRSA, PCR NEGATIVE NEGATIVE Final   Staphylococcus aureus NEGATIVE NEGATIVE Final    Comment: (NOTE) The Xpert SA Assay (FDA approved for NASAL specimens in patients 65 years of age and older), is one component of a comprehensive surveillance program. It is not intended to diagnose infection nor to guide or monitor treatment. Performed at [facility], [place] [address], [place], [place] [address]   Culture, blood (Routine X 2) w Reflex to ID Panel     Status: None (Preliminary result)   Collection Time: 11/09/22 10:06 AM   Specimen: BLOOD LEFT ARM  Result Value Ref Range Status   Specimen Description BLOOD LEFT ARM  Final   Special Requests   Final    BOTTLES DRAWN AEROBIC AND ANAEROBIC Blood Culture adequate volume   Culture   Final    NO GROWTH 2 DAYS Performed at [facility], [place] [address], [place], [place] [address]    Report Status PENDING  Incomplete  Culture, blood (Routine X 2) w Reflex to ID Panel     Status: None (Preliminary result)   Collection Time: 11/09/22 10:10 AM   Specimen: BLOOD LEFT HAND  Result Value Ref Range Status   Specimen Description BLOOD LEFT HAND  Final   Special Requests   Final    BOTTLES DRAWN AEROBIC AND ANAEROBIC Blood Culture adequate volume   Culture   Final    NO GROWTH 2  DAYS Performed at [facility], [place] [address] [place], [place] [address]    Report Status PENDING  Incomplete      Radiology Studies: ECHOCARDIOGRAM COMPLETE  Result Date: 11/10/2022    ECHOCARDIOGRAM REPORT   Patient Name:   [name] Date of Exam: 11/10/2022 Medical Rec #:  [id number]       Height:       71.0 in Accession #:    [id number]      Weight:       167.1 lb Date of Birth:  [birth year]       BSA:          1.954 m Patient Age:    37 years        BP:           101/72 mmHg Patient Gender: M               HR:           74 bpm. Exam Location:  Inpatient Procedure: 2D Echo, Color Doppler, Cardiac Doppler and Intracardiac            Opacification Agent Indications:    Bacteremia  History:        Patient has prior history of Echocardiogram examinations, most                 recent 09/14/2022. CHF, CAD; Risk Factors:Hypertension, Diabetes                 and Dyslipidemia.  Sonographer:    Raquel Sarna Senior RDCS Referring Phys: [phone] RALPH A NETTEY  Sonographer Comments: Technically  difficult due to significantly more lung interference than prior studies. IMPRESSIONS  1. Left ventricular ejection fraction, by estimation, is 40 to 45%. The left ventricle has mildly decreased function. The left ventricle demonstrates regional wall motion abnormalities (see scoring diagram/findings for description). Left ventricular diastolic parameters are consistent with Grade I diastolic dysfunction (impaired relaxation).  2. Right ventricular systolic function is normal. The right ventricular size is normal.  3. The mitral valve is grossly normal. Trivial mitral valve regurgitation.  4. The aortic valve is grossly normal. Aortic valve regurgitation is not visualized. Comparison(s): No significant change from prior study. Conclusion(s)/Recommendation(s): No evidence of valvular vegetations on this transthoracic echocardiogram. Consider a transesophageal echocardiogram to exclude infective endocarditis if clinically  indicated. FINDINGS  Left Ventricle: Left ventricular ejection fraction, by estimation, is 40 to 45%. The left ventricle has mildly decreased function. The left ventricle demonstrates regional wall motion abnormalities. Definity contrast agent was given IV to delineate the left ventricular endocardial borders. The left ventricular internal cavity size was normal in size. There is no left ventricular hypertrophy. Left ventricular diastolic parameters are consistent with Grade I diastolic dysfunction (impaired relaxation).  LV Wall Scoring: The basal inferior segment is aneurysmal. The basal inferolateral segment is dyskinetic. The mid inferolateral segment, mid inferoseptal segment, mid inferior segment, and basal inferoseptal segment are hypokinetic. The entire anterior wall, antero-lateral wall, entire anterior septum, and entire apex are normal. Right Ventricle: The right ventricular size is normal. Right vetricular wall thickness was not well visualized. Right ventricular systolic function is normal. Left Atrium: Left atrial size was normal in size. Right Atrium: Right atrial size was normal in size. Pericardium: There is no evidence of pericardial effusion. Mitral Valve: The mitral valve is grossly normal. Trivial mitral valve regurgitation. Tricuspid Valve: The tricuspid valve is not well visualized. Tricuspid valve regurgitation is not demonstrated. Aortic Valve: The aortic valve is grossly normal. Aortic valve regurgitation is not visualized. Pulmonic Valve: The pulmonic valve was not well visualized. Pulmonic valve regurgitation is not visualized. Aorta: The aortic root was not well visualized, the ascending aorta was not well visualized and the aortic arch was not well visualized. IAS/Shunts: No atrial level shunt detected by color flow Doppler.   LV Volumes (MOD) LV vol d, MOD A2C: 191.0 ml Diastology LV vol d, MOD A4C: 143.0 ml LV e' medial:    4.35 cm/s LV vol s, MOD A2C: 119.0 ml LV E/e' medial:  14.5  LV vol s, MOD A4C: 70.2 ml  LV e' lateral:   4.24 cm/s LV SV MOD A2C:     72.0 ml  LV E/e' lateral: 14.9 LV SV MOD A4C:     143.0 ml LV SV MOD BP:      73.5 ml RIGHT VENTRICLE RV S prime:     12.90 cm/s TAPSE (M-mode): 2.8 cm LEFT ATRIUM             Index        RIGHT ATRIUM           Index LA Vol (A2C):   47.1 ml 24.11 ml/m  RA Area:     11.20 cm LA Vol (A4C):   41.9 ml 21.45 ml/m  RA Volume:   21.60 ml  11.06 ml/m LA Biplane Vol: 48.1 ml 24.62 ml/m  AORTIC VALVE LVOT Vmax:   81.60 cm/s LVOT Vmean:  67.000 cm/s LVOT VTI:    0.179 m MITRAL VALVE MV Area (PHT): 4.77 cm    SHUNTS MV Decel Time:  159 msec    Systemic VTI: 0.18 m MV E velocity: 63.00 cm/s MV A velocity: 73.70 cm/s MV E/A ratio:  0.85 Rudean Haskell MD Electronically signed by Rudean Haskell MD Signature Date/Time: 11/10/2022/6:02:05 PM    Final    DG ERCP  Result Date: 11/09/2022 CLINICAL DATA:  ERCP EXAM: ERCP TECHNIQUE: Multiple spot images obtained with the fluoroscopic device and submitted for interpretation post-procedure. FLUOROSCOPY: Refer to separate report COMPARISON:  None Available. FINDINGS: A total of 13 fluoroscopic spot images taken during ERCP are submitted for review. Initial images demonstrate a scope overlying the upper abdomen. A plastic biliary stent appears to course through a metallic biliary stent. The plastic biliary stent is removed. Wire catheterization of the common bile duct is performed. Contrast injection opacifies the intrahepatic and extrahepatic biliary tree. The intrahepatic biliary ducts appear normal in caliber. Balloon sweeps are performed through the common bile duct. Final images demonstrate decompression of the intrahepatic biliary tree with passage of contrast material into the small bowel. IMPRESSION: ERCP images as described. Refer to separate procedure report for full details. These images were submitted for radiologic interpretation only. Please see the procedural report for the amount of  contrast and the fluoroscopy time utilized. Electronically Signed   By: Albin Felling M.D.   On: 11/09/2022 14:54      LOS: 4 days    Flora Lipps, MD [facility] 11/11/2022, 9:10 AM If 7PM-7AM, please contact night-coverage [email or web address]

## 2022-11-11 NOTE — Progress Notes (Signed)
Daily Progress Note  DOA: 11/07/2022 Hospital Day: 5 Chief Complaint: Recurrent biliary obstruction   ASSESSMENT   1)unresectable cholangiocarcinoma s/p portal vein embolization in 06/2021 with recurrent biliary obstruction and cholangitis s/p stent placement in 05/2021 and 10/2022  -CT 3/33 showed increased ascites and intrahepatic biliary dilation and no pneumobilia raising concern for stent dysfunction -ERCP 4/1 showed partial occlusion of plastic biliary stent that went from CBD to left main hepatic duct, removed.  Previously placed uncovered metal stent was patent.  Biliary tree swept of sludge. -LFTs currently stable  2) Sepsis -Elevated white count (12)   PLAN   -Continue to trend LFTs -If LFTs do not improve reconsider PTC -Antibiotics per ID -Continue heart healthy diet  Subjective / New events:  Patient reports he is doing well today.  Tolerating diet without difficulty.  Reports that he would like to go home if possible.  Wife is at bedside.  Normal bowel movements without bleeding.  Denies abdominal pain.  Denies nausea/vomiting.   Lab Results: Recent Labs    11/09/22 0343 11/10/22 0855 11/11/22 0550  WBC 7.6 8.3 12.0*  HGB 8.3* 8.3* 8.7*  HCT 23.8* 23.5* 24.9*  PLT 74* 67* 74*   BMET Recent Labs    11/09/22 0343 11/10/22 0855 11/11/22 0550  NA 129* 131* 132*  K 3.6 4.2 4.1  CL 99 102 101  CO2 21* 20* 20*  GLUCOSE 153* 139* 167*  BUN 34* 44* 51*  CREATININE 2.28* 2.92* 3.61*  CALCIUM 7.7* 7.9* 8.0*   LFT Recent Labs    11/11/22 0550  PROT 4.8*  ALBUMIN <1.5*  AST 112*  ALT 86*  ALKPHOS 387*  BILITOT 2.0*   PT/INR No results for input(s): "LABPROT", "INR" in the last 72 hours.   Scheduled inpatient medications:   Chlorhexidine Gluconate Cloth  6 each Topical Daily   diclofenac  100 mg Rectal Once   feeding supplement  237 mL Oral TID BM   multivitamin with minerals  1 tablet Oral Daily   pantoprazole (PROTONIX) IV  40 mg  Intravenous Q24H   sodium chloride flush  10-40 mL Intracatheter Q12H   Continuous inpatient infusions:   ampicillin-sulbactam (UNASYN) IV     PRN inpatient medications: ondansetron **OR** ondansetron (ZOFRAN) IV  Vital signs in last 24 hours: Temp:  [97.7 F (36.5 C)-98 F (36.7 C)] 97.7 F (36.5 C) (04/03 0727) Pulse Rate:  [75-82] 82 (04/03 0727) Resp:  [14-17] 17 (04/03 0727) BP: (96-103)/(61-70) 103/67 (04/03 0727) SpO2:  [97 %-100 %] 97 % (04/03 0727) Last BM Date : 11/09/22  Intake/Output Summary (Last 24 hours) at 11/11/2022 0924 Last data filed at 11/11/2022 0548 Gross per 24 hour  Intake 950 ml  Output 1500 ml  Net -550 ml    Intake/Output from previous day: 04/02 0701 - 04/03 0700 In: 950 [P.O.:840; I.V.:10; IV Piggyback:100] Out: 1500 [Urine:1500] Intake/Output this shift: No intake/output data recorded.   Physical Exam:  General: Alert male in NAD Heart:  Regular rate and rhythm.  Pulmonary: Normal respiratory effort Abdomen: Soft, nondistended, nontender. Normal bowel sounds. Neurologic: Alert and oriented Psych: Pleasant. Cooperative. Insight appears normal.    Principal Problem:   Metastatic cholangiocarcinoma to bile duct Active Problems:   Mixed hyperlipidemia   Transaminitis   Leukocytosis   Hyponatremia   GERD (gastroesophageal reflux disease)   Hypoalbuminemia due to protein-calorie malnutrition   Sepsis   Hyperbilirubinemia   Dehydration   Nausea and vomiting   Ischemic cardiomyopathy  Acute kidney injury superimposed on chronic kidney disease   Biliary obstruction   Lactic acidosis   Prediabetes   Malignant ascites   Abdominal distention   Gram-negative bacteremia   Cholangitis   Bile duct stricture     LOS: 4 days   Shaunte Weissinger M Jamey Demchak ,PA-C 11/11/2022, 9:24 AM

## 2022-11-11 NOTE — Consult Note (Signed)
Reason for Consult: Renal failure Referring Physician:  Dr. Louanne Belton  Chief Complaint:  Fevers  Assessment/Plan: Acute renal failure with creatinine in the 1-1.2 range in 2023, as late as 07/08/22 and since then has been slowly rising to 1.3-1.44 09/2022 and then 1.3-1.6 in 11/02/2022. Since then creatinine was has steadily risen since hospitalization with a presenting creatinine that initially improved from 2.9 -> 2.28 (4/1). Patient's SBP has bee in the 80-100's range.  Patient is neg 1751mL during this hospitalization. Patient likely in ATN from hypotension and infection with Enterococcus faecalis bacteremia + contrast which can enter the venous system through trauma or biliary circulation.  TB peaked at 3.3 and certainly possible the patient may also have bile acid nephropathy but TB has decreased since peaking at 3.3.  On microscopy which I performed there is not an active sediment but there are pigmented casts c/w ATN. - At this time no indication for dialysis  and will follow closely with you.  -Maintain MAP>65 for optimal renal perfusion.  - Avoid nephrotoxic medications including NSAIDs and iodinated intravenous contrast exposure unless the latter is absolutely indicated.   - Preferred narcotic agents for pain control are hydromorphone, fentanyl, and methadone. Morphine should not be used.  - Avoid Baclofen and avoid oral sodium phosphate and magnesium citrate based laxatives / bowel preps.  - Continue strict Input and Output monitoring. Will monitor the patient closely with you and intervene or adjust therapy as indicated by changes in clinical status/labs  Unresectable cholangiocarcinoma s/p portal vein embolization 08/2020 and subsequent stent placement in 20/2022 and 10/2022.  Last chemotherapy was in late 2023.  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   HPI: Jonathan Keith is an 67 y.o. male metastatic  cholangiocarcinoma with biliary obstruction and recent stent placement, HLD, CASHD w/ ischemic cardiomyopathy HFrEF EF 45-50%, recent LV thrombosis treated with Coumadin, presenting with a 3 day history of fever and generalized weakness in the setting of abdominal pain worse in the RUQ. He has had vomiting with decreased PO intake as well vomiting anything he eats. Temperature was noted to be 101 deg night before admission but he denied shortness of breath, diarrhea. He had a ERCP done 10/23/2022 with placement of  a biliary stent in the left hepatic duct. WBC in the ED was 24.3 with a cr of 2.5 + CT A/P suspicious for carcinomatosis with intrahepatic biliary dilatation. Patient was subsequently treated with Vancomycin, Flagyl and Cefepime with transition to Unasyn seen by ID. Patient with E. Faecalis bacteremia. Patient had a repeat ERCP on 4/1 demonstrating that the right hepatic system did not fill, the stent in the left main duct was partially occluded, left main + common hepatic duct with stricture, patent stent in the common bile duct. Patient also had a paracentesis 11/08/2022 with removal of 1.2L clear yellow fluid with negative for Gram stain and culture as well.   ROS Pertinent items are noted in HPI.  Chemistry and CBC: Creatinine, Ser  Date/Time Value Ref Range Status  11/11/2022 05:50 AM 3.61 (H) 0.61 - 1.24 mg/dL Final  11/10/2022 08:55 AM 2.92 (H) 0.61 - 1.24 mg/dL Final  11/09/2022 03:43 AM 2.28 (H) 0.61 - 1.24 mg/dL Final  11/08/2022 03:57 AM 2.44 (H) 0.61 - 1.24 mg/dL Final  11/07/2022 12:13 PM 2.51 (H) 0.61 - 1.24 mg/dL Final  11/07/2022 12:13 PM 2.90 (H) 0.61 - 1.24 mg/dL Final  11/02/2022 07:48 AM 1.50 (H) 0.61 -  1.24 mg/dL Final  10/25/2022 03:46 AM 1.49 (H) 0.61 - 1.24 mg/dL Final  10/24/2022 04:30 AM 1.60 (H) 0.61 - 1.24 mg/dL Final  10/23/2022 09:00 AM 1.36 (H) 0.61 - 1.24 mg/dL Final  10/22/2022 11:47 AM 1.40 (H) 0.61 - 1.24 mg/dL Final  10/21/2022 01:05 PM 1.51 (H) 0.61 -  1.24 mg/dL Final  10/19/2022 08:11 AM 1.33 (H) 0.61 - 1.24 mg/dL Final  10/05/2022 07:49 AM 1.44 (H) 0.61 - 1.24 mg/dL Final  09/28/2022 10:04 AM 1.39 (H) 0.61 - 1.24 mg/dL Final  09/01/2022 09:15 AM 1.38 (H) 0.61 - 1.24 mg/dL Final  08/05/2022 08:10 AM 1.25 (H) 0.61 - 1.24 mg/dL Final  07/08/2022 07:50 AM 1.27 (H) 0.61 - 1.24 mg/dL Final  06/10/2022 07:59 AM 1.16 0.61 - 1.24 mg/dL Final  06/08/2022 03:51 PM 1.15 0.61 - 1.24 mg/dL Final  05/27/2022 08:07 AM 1.17 0.61 - 1.24 mg/dL Final  05/13/2022 07:57 AM 1.08 0.61 - 1.24 mg/dL Final  04/27/2022 07:54 AM 1.20 0.61 - 1.24 mg/dL Final  04/09/2022 09:31 PM 1.43 (H) 0.61 - 1.24 mg/dL Final  04/01/2022 07:49 AM 1.28 (H) 0.61 - 1.24 mg/dL Final  03/17/2022 08:10 AM 1.22 0.61 - 1.24 mg/dL Final  03/03/2022 08:06 AM 1.07 0.61 - 1.24 mg/dL Final  02/11/2022 07:53 AM 1.03 0.61 - 1.24 mg/dL Final  01/27/2022 07:42 AM 1.05 0.61 - 1.24 mg/dL Final  01/13/2022 07:53 AM 0.97 0.61 - 1.24 mg/dL Final  12/30/2021 07:21 AM 1.03 0.61 - 1.24 mg/dL Final  11/29/2021 06:47 AM 0.94 0.61 - 1.24 mg/dL Final  11/28/2021 04:33 AM 1.03 0.61 - 1.24 mg/dL Final  11/26/2021 09:32 PM 1.00 0.61 - 1.24 mg/dL Final  11/25/2021 07:49 AM 0.97 0.61 - 1.24 mg/dL Final  11/11/2021 07:45 AM 0.90 0.61 - 1.24 mg/dL Final  11/04/2021 07:52 AM 1.01 0.61 - 1.24 mg/dL Final  10/21/2021 07:46 AM 0.88 0.61 - 1.24 mg/dL Final  10/14/2021 08:00 AM 0.97 0.61 - 1.24 mg/dL Final  09/30/2021 07:58 AM 0.89 0.61 - 1.24 mg/dL Final  09/23/2021 07:55 AM 0.89 0.61 - 1.24 mg/dL Final  09/10/2021 12:04 AM 1.00 0.61 - 1.24 mg/dL Final  09/09/2021 01:11 AM 0.97 0.61 - 1.24 mg/dL Final  09/08/2021 12:19 PM 0.85 0.61 - 1.24 mg/dL Final  09/02/2021 08:05 AM 0.84 0.61 - 1.24 mg/dL Final  08/19/2021 08:23 AM 0.87 0.61 - 1.24 mg/dL Final  08/12/2021 08:19 AM 0.78 0.61 - 1.24 mg/dL Final  07/29/2021 10:12 AM 0.90 0.61 - 1.24 mg/dL Final  07/22/2021 08:35 AM 1.05 0.61 - 1.24 mg/dL Final  07/17/2021  02:32 PM 0.99 0.61 - 1.24 mg/dL Final  06/16/2021 09:25 AM 0.69 (L) 0.76 - 1.27 mg/dL Final  05/29/2021 05:18 AM 0.63 0.61 - 1.24 mg/dL Final   Recent Labs  Lab 11/07/22 1213 11/08/22 0357 11/09/22 0343 11/10/22 0855 11/11/22 0550  NA 124*  128* 127* 129* 131* 132*  K 4.5  4.6 3.8 3.6 4.2 4.1  CL 96*  96* 97* 99 102 101  CO2 20* 19* 21* 20* 20*  GLUCOSE 161*  155* 98 153* 139* 167*  BUN 37*  32* 36* 34* 44* 51*  CREATININE 2.51*  2.90* 2.44* 2.28* 2.92* 3.61*  CALCIUM 7.9* 7.7* 7.7* 7.9* 8.0*  PHOS  --  4.0  --   --   --    Recent Labs  Lab 11/07/22 1213 11/08/22 0357 11/09/22 0343 11/10/22 0855 11/11/22 0550  WBC 24.3* 12.6* 7.6 8.3 12.0*  NEUTROABS 21.6*  --   --   --   --  HGB 9.3*  9.2* 7.9* 8.3* 8.3* 8.7*  HCT 27.2*  27.0* 22.1* 23.8* 23.5* 24.9*  MCV 98.2 94.4 95.6 95.1 95.8  PLT 92* 79* 74* 67* 74*   Liver Function Tests: Recent Labs  Lab 11/09/22 0343 11/10/22 0855 11/11/22 0550  AST 108* 93* 112*  ALT 62* 68* 86*  ALKPHOS 298* 348* 387*  BILITOT 2.6* 2.1* 2.0*  PROT 4.9* 4.7* 4.8*  ALBUMIN <1.5* <1.5* <1.5*   No results for input(s): "LIPASE", "AMYLASE" in the last 168 hours. No results for input(s): "AMMONIA" in the last 168 hours. Cardiac Enzymes: No results for input(s): "CKTOTAL", "CKMB", "CKMBINDEX", "TROPONINI" in the last 168 hours. Iron Studies: No results for input(s): "IRON", "TIBC", "TRANSFERRIN", "FERRITIN" in the last 72 hours. PT/INR: @LABRCNTIP (inr:5)  Xrays/Other Studies: ) Results for orders placed or performed during the hospital encounter of 11/07/22 (from the past 48 hour(s))  CBC     Status: Abnormal   Collection Time: 11/10/22  8:55 AM  Result Value Ref Range   WBC 8.3 4.0 - 10.5 K/uL   RBC 2.47 (L) 4.22 - 5.81 MIL/uL   Hemoglobin 8.3 (L) 13.0 - 17.0 g/dL   HCT 23.5 (L) 39.0 - 52.0 %   MCV 95.1 80.0 - 100.0 fL   MCH 33.6 26.0 - 34.0 pg   MCHC 35.3 30.0 - 36.0 g/dL   RDW 16.6 (H) 11.5 - 15.5 %   Platelets 67  (L) 150 - 400 K/uL    Comment: Immature Platelet Fraction may be clinically indicated, consider ordering this additional test [id number] CONSISTENT WITH PREVIOUS RESULT REPEATED TO VERIFY    nRBC 0.0 0.0 - 0.2 %    Comment: Performed at [facility], [place] [address], [place], [place] [address]  Comprehensive metabolic panel     Status: Abnormal   Collection Time: 11/10/22  8:55 AM  Result Value Ref Range   Sodium 131 (L) 135 - 145 mmol/L   Potassium 4.2 3.5 - 5.1 mmol/L   Chloride 102 98 - 111 mmol/L   CO2 20 (L) 22 - 32 mmol/L   Glucose, Bld 139 (H) 70 - 99 mg/dL    Comment: Glucose reference range applies only to samples taken after fasting for at least 8 hours.   BUN 44 (H) 8 - 23 mg/dL   Creatinine, Ser 2.92 (H) 0.61 - 1.24 mg/dL   Calcium 7.9 (L) 8.9 - 10.3 mg/dL   Total Protein 4.7 (L) 6.5 - 8.1 g/dL   Albumin <1.5 (L) 3.5 - 5.0 g/dL   AST 93 (H) 15 - 41 U/L   ALT 68 (H) 0 - 44 U/L   Alkaline Phosphatase 348 (H) 38 - 126 U/L   Total Bilirubin 2.1 (H) 0.3 - 1.2 mg/dL   GFR, Estimated 23 (L) >60 mL/min    Comment: (NOTE) Calculated using the CKD-EPI Creatinine Equation (2021)    Anion gap 9 5 - 15    Comment: Performed at [facility], [place] [address] [place], [place] [address]  Comprehensive metabolic panel     Status: Abnormal   Collection Time: 11/11/22  5:50 AM  Result Value Ref Range   Sodium 132 (L) 135 - 145 mmol/L   Potassium 4.1 3.5 - 5.1 mmol/L   Chloride 101 98 - 111 mmol/L   CO2 20 (L) 22 - 32 mmol/L   Glucose, Bld 167 (H) 70 - 99 mg/dL    Comment: Glucose reference range applies only to samples taken after fasting for  at least 8 hours.   BUN 51 (H) 8 - 23 mg/dL   Creatinine, Ser 3.61 (H) 0.61 - 1.24 mg/dL   Calcium 8.0 (L) 8.9 - 10.3 mg/dL   Total Protein 4.8 (L) 6.5 - 8.1 g/dL   Albumin <1.5 (L) 3.5 - 5.0 g/dL   AST 112 (H) 15 - 41 U/L   ALT 86 (H) 0 - 44 U/L   Alkaline Phosphatase 387 (H) 38 - 126 U/L   Total Bilirubin 2.0 (H) 0.3 - 1.2  mg/dL   GFR, Estimated 18 (L) >60 mL/min    Comment: (NOTE) Calculated using the CKD-EPI Creatinine Equation (2021)    Anion gap 11 5 - 15    Comment: Performed at [facility], [place] [address], [place], [place] [address]  CBC     Status: Abnormal   Collection Time: 11/11/22  5:50 AM  Result Value Ref Range   WBC 12.0 (H) 4.0 - 10.5 K/uL   RBC 2.60 (L) 4.22 - 5.81 MIL/uL   Hemoglobin 8.7 (L) 13.0 - 17.0 g/dL   HCT 24.9 (L) 39.0 - 52.0 %   MCV 95.8 80.0 - 100.0 fL   MCH 33.5 26.0 - 34.0 pg   MCHC 34.9 30.0 - 36.0 g/dL   RDW 16.8 (H) 11.5 - 15.5 %   Platelets 74 (L) 150 - 400 K/uL    Comment: Immature Platelet Fraction may be clinically indicated, consider ordering this additional test [id number] REPEATED TO VERIFY    nRBC 0.0 0.0 - 0.2 %    Comment: Performed at [facility], [place] [address] [place], [place] [address]  Urinalysis, Routine w reflex microscopic -Urine, Clean Catch     Status: Abnormal   Collection Time: 11/11/22 11:35 AM  Result Value Ref Range   Color, Urine AMBER (A) YELLOW    Comment: BIOCHEMICALS MAY BE AFFECTED BY COLOR   APPearance CLOUDY (A) CLEAR   Specific Gravity, Urine 1.023 1.005 - 1.030   pH 5.0 5.0 - 8.0   Glucose, UA NEGATIVE NEGATIVE mg/dL   Hgb urine dipstick NEGATIVE NEGATIVE   Bilirubin Urine NEGATIVE NEGATIVE   Ketones, ur NEGATIVE NEGATIVE mg/dL   Protein, ur 30 (A) NEGATIVE mg/dL   Nitrite NEGATIVE NEGATIVE   Leukocytes,Ua TRACE (A) NEGATIVE   RBC / HPF 0-5 0 - 5 RBC/hpf   WBC, UA 11-20 0 - 5 WBC/hpf   Bacteria, UA RARE (A) NONE SEEN   Squamous Epithelial / HPF 0-5 0 - 5 /HPF   Mucus PRESENT     Comment: Performed at [facility], [address] [place], [place] [address]  Sodium, urine, random     Status: None   Collection Time: 11/11/22 11:36 AM  Result Value Ref Range   Sodium, Ur 20 mmol/L    Comment: Performed at [facility] [address] [place], [place] [address]  Creatinine, urine, random      Status: None   Collection Time: 11/11/22 11:36 AM  Result Value Ref Range   Creatinine, Urine 151 mg/dL    Comment: Performed at [facility] [address] [place], [place] [address]   ECHOCARDIOGRAM COMPLETE  Result Date: 11/10/2022    ECHOCARDIOGRAM REPORT   Patient Name:   Jonathan Keith Date of Exam: 11/10/2022 Medical Rec #:  [id number]       Height:       71.0 in Accession #:    [id number]  Weight:       167.1 lb Date of Birth:  [birth year]       BSA:          1.954 m Patient Age:    67 years        BP:           101/72 mmHg Patient Gender: M               HR:           74 bpm. Exam Location:  Inpatient Procedure: 2D Echo, Color Doppler, Cardiac Doppler and Intracardiac            Opacification Agent Indications:    Bacteremia  History:        Patient has prior history of Echocardiogram examinations, most                 recent 09/14/2022. CHF, CAD; Risk Factors:Hypertension, Diabetes                 and Dyslipidemia.  Sonographer:    Raquel Sarna Senior RDCS Referring Phys: [phone] RALPH A NETTEY  Sonographer Comments: Technically difficult due to significantly more lung interference than prior studies. IMPRESSIONS  1. Left ventricular ejection fraction, by estimation, is 40 to 45%. The left ventricle has mildly decreased function. The left ventricle demonstrates regional wall motion abnormalities (see scoring diagram/findings for description). Left ventricular diastolic parameters are consistent with Grade I diastolic dysfunction (impaired relaxation).  2. Right ventricular systolic function is normal. The right ventricular size is normal.  3. The mitral valve is grossly normal. Trivial mitral valve regurgitation.  4. The aortic valve is grossly normal. Aortic valve regurgitation is not visualized. Comparison(s): No significant change from prior study. Conclusion(s)/Recommendation(s): No evidence of valvular vegetations on this transthoracic echocardiogram. Consider a transesophageal echocardiogram to  exclude infective endocarditis if clinically indicated. FINDINGS  Left Ventricle: Left ventricular ejection fraction, by estimation, is 40 to 45%. The left ventricle has mildly decreased function. The left ventricle demonstrates regional wall motion abnormalities. Definity contrast agent was given IV to delineate the left ventricular endocardial borders. The left ventricular internal cavity size was normal in size. There is no left ventricular hypertrophy. Left ventricular diastolic parameters are consistent with Grade I diastolic dysfunction (impaired relaxation).  LV Wall Scoring: The basal inferior segment is aneurysmal. The basal inferolateral segment is dyskinetic. The mid inferolateral segment, mid inferoseptal segment, mid inferior segment, and basal inferoseptal segment are hypokinetic. The entire anterior wall, antero-lateral wall, entire anterior septum, and entire apex are normal. Right Ventricle: The right ventricular size is normal. Right vetricular wall thickness was not well visualized. Right ventricular systolic function is normal. Left Atrium: Left atrial size was normal in size. Right Atrium: Right atrial size was normal in size. Pericardium: There is no evidence of pericardial effusion. Mitral Valve: The mitral valve is grossly normal. Trivial mitral valve regurgitation. Tricuspid Valve: The tricuspid valve is not well visualized. Tricuspid valve regurgitation is not demonstrated. Aortic Valve: The aortic valve is grossly normal. Aortic valve regurgitation is not visualized. Pulmonic Valve: The pulmonic valve was not well visualized. Pulmonic valve regurgitation is not visualized. Aorta: The aortic root was not well visualized, the ascending aorta was not well visualized and the aortic arch was not well visualized. IAS/Shunts: No atrial level shunt detected by color flow Doppler.   LV Volumes (MOD) LV vol d, MOD A2C: 191.0 ml Diastology LV vol d, MOD A4C: 143.0 ml LV e' medial:  4.35 cm/s LV vol  s, MOD A2C: 119.0 ml LV E/e' medial:  14.5 LV vol s, MOD A4C: 70.2 ml  LV e' lateral:   4.24 cm/s LV SV MOD A2C:     72.0 ml  LV E/e' lateral: 14.9 LV SV MOD A4C:     143.0 ml LV SV MOD BP:      73.5 ml RIGHT VENTRICLE RV S prime:     12.90 cm/s TAPSE (M-mode): 2.8 cm LEFT ATRIUM             Index        RIGHT ATRIUM           Index LA Vol (A2C):   47.1 ml 24.11 ml/m  RA Area:     11.20 cm LA Vol (A4C):   41.9 ml 21.45 ml/m  RA Volume:   21.60 ml  11.06 ml/m LA Biplane Vol: 48.1 ml 24.62 ml/m  AORTIC VALVE LVOT Vmax:   81.60 cm/s LVOT Vmean:  67.000 cm/s LVOT VTI:    0.179 m MITRAL VALVE MV Area (PHT): 4.77 cm    SHUNTS MV Decel Time: 159 msec    Systemic VTI: 0.18 m MV E velocity: 63.00 cm/s MV A velocity: 73.70 cm/s MV E/A ratio:  0.85 Rudean Haskell MD Electronically signed by Rudean Haskell MD Signature Date/Time: 11/10/2022/6:02:05 PM    Final    DG ERCP  Result Date: 11/09/2022 CLINICAL DATA:  ERCP EXAM: ERCP TECHNIQUE: Multiple spot images obtained with the fluoroscopic device and submitted for interpretation post-procedure. FLUOROSCOPY: Refer to separate report COMPARISON:  None Available. FINDINGS: A total of 13 fluoroscopic spot images taken during ERCP are submitted for review. Initial images demonstrate a scope overlying the upper abdomen. A plastic biliary stent appears to course through a metallic biliary stent. The plastic biliary stent is removed. Wire catheterization of the common bile duct is performed. Contrast injection opacifies the intrahepatic and extrahepatic biliary tree. The intrahepatic biliary ducts appear normal in caliber. Balloon sweeps are performed through the common bile duct. Final images demonstrate decompression of the intrahepatic biliary tree with passage of contrast material into the small bowel. IMPRESSION: ERCP images as described. Refer to separate procedure report for full details. These images were submitted for radiologic interpretation only. Please  see the procedural report for the amount of contrast and the fluoroscopy time utilized. Electronically Signed   By: Albin Felling M.D.   On: 11/09/2022 14:54    PMH:   Past Medical History:  Diagnosis Date   Cancer    CHF (congestive heart failure)    Chronic pain    neck, back, knees   Class 1 obesity due to excess calories with body mass index (BMI) of 31.0 to 31.9 in adult    Claudication    Coronary artery disease    DDD (degenerative disc disease), cervical    Diabetes mellitus    Dyslipidemia    Hypertension    Port-A-Cath in place 07/20/2021   Tobacco dependence     PSH:   Past Surgical History:  Procedure Laterality Date   BILIARY BRUSHING N/A 02/25/2021   Procedure: BILIARY BRUSHING;  Surgeon: Rogene Houston, MD;  Location: [facility];  Service: Gastroenterology;  Laterality: N/A;   BILIARY DILATION  10/23/2022   Procedure: BILIARY DILATION;  Surgeon: Rush Landmark Telford Nab., MD;  Location: [facility];  Service: Gastroenterology;;   BILIARY STENT PLACEMENT N/A 02/25/2021   Procedure: BILIARY STENT PLACEMENT;  Surgeon: Rogene Houston, MD;  Location: [facility];  Service: Gastroenterology;  Laterality: N/A;   BILIARY STENT PLACEMENT  02/27/2021   Procedure: BILIARY STENT PLACEMENT (10FR x 9cm) IN THE RIGHT SYSTEM;  Surgeon: Rogene Houston, MD;  Location: [facility];  Service: Gastroenterology;;   BILIARY STENT PLACEMENT  10/23/2022   Procedure: BILIARY STENT PLACEMENT;  Surgeon: Irving Copas., MD;  Location: [place];  Service: Gastroenterology;;   BREAST SURGERY Left    benign lump- in his 45s.   CARDIAC CATHETERIZATION     ERCP N/A 02/25/2021   Procedure: ENDOSCOPIC RETROGRADE CHOLANGIOPANCREATOGRAPHY (ERCP);  Surgeon: Rogene Houston, MD;  Location: [facility];  Service: Gastroenterology;  Laterality: N/A;   ERCP N/A 02/27/2021   Procedure: ENDOSCOPIC RETROGRADE CHOLANGIOPANCREATOGRAPHY (ERCP);  Surgeon: Rogene Houston, MD;  Location: [facility];  Service:  Gastroenterology;  Laterality: N/A;   ERCP N/A 10/23/2022   Procedure: ENDOSCOPIC RETROGRADE CHOLANGIOPANCREATOGRAPHY (ERCP);  Surgeon: Irving Copas., MD;  Location: [place];  Service: Gastroenterology;  Laterality: N/A;   ERCP N/A 11/09/2022   Procedure: ENDOSCOPIC RETROGRADE CHOLANGIOPANCREATOGRAPHY (ERCP);  Surgeon: Ladene Artist, MD;  Location: [place] [facility];  Service: Gastroenterology;  Laterality: N/A;  Stent removal   IR IMAGING GUIDED PORT INSERTION  07/21/2021   IR REMOVAL TUN ACCESS W/ PORT W/O FL MOD SED  09/30/2022   KNEE SURGERY Left    x2   LEFT HEART CATH AND CORONARY ANGIOGRAPHY N/A 09/08/2021   Procedure: LEFT HEART CATH AND CORONARY ANGIOGRAPHY;  Surgeon: Early Osmond, MD;  Location: [facility];  Service: Cardiovascular;  Laterality: N/A;   REMOVAL OF STONES  10/23/2022   Procedure: REMOVAL OF STONES;  Surgeon: Rush Landmark Telford Nab., MD;  Location: [place];  Service: Gastroenterology;;   REMOVAL OF STONES  11/09/2022   Procedure: REMOVAL OF STONES;  Surgeon: Ladene Artist, MD;  Location: [place];  Service: Gastroenterology;;   SPHINCTEROTOMY N/A 02/25/2021   Procedure: Joan Mayans;  Surgeon: Rogene Houston, MD;  Location: [facility];  Service: Gastroenterology;  Laterality: N/A;   STENT REMOVAL  02/27/2021   Procedure: STENT REMOVAL (8.5Fr x 9cm);  Surgeon: Rogene Houston, MD;  Location: [facility];  Service: Gastroenterology;;   Lavell Islam REMOVAL  11/09/2022   Procedure: STENT REMOVAL;  Surgeon: Ladene Artist, MD;  Location: [facility];  Service: Gastroenterology;;    Allergies: No Known Allergies  Medications:   Prior to Admission medications   Medication Sig Start Date End Date Taking? Authorizing Provider  acetaminophen (TYLENOL) 500 MG tablet Take 500 mg by mouth every 6 (six) hours as needed for moderate pain or headache.   Yes [provider]  albuterol (VENTOLIN HFA) 108 (90 Base) MCG/ACT inhaler Inhale 2 puffs into  the lungs every 6 (six) hours as needed for wheezing or shortness of breath. 11/29/21  Yes Tat, Shanon Brow, MD  alfuzosin (UROXATRAL) 10 MG 24 hr tablet Take 1 tablet (10 mg total) by mouth at bedtime. 08/21/22  Yes McKenzie, Candee Furbish, MD  atorvastatin (LIPITOR) 40 MG tablet Take 1 tablet (40 mg total) by mouth daily at 6 PM. 12/05/21  Yes O'Neal, Cassie Freer, MD  carvedilol (COREG) 3.125 MG tablet Take 1 tablet (3.125 mg total) by mouth 2 (two) times daily with a meal. 10/25/22 11/24/22 Yes Pahwani, Einar Grad, MD  CISplatin (PLATINOL) 50 MG/50ML SOLN Inject 50 mg into the vein once a week. On days 1 and 8, every 21 days   Yes [provider]  durvalumab 1,500 mg in sodium  chloride 0.9 % 100 mL Inject 1,500 mg into the vein every 21 ( twenty-one) days.   Yes [provider]  famotidine (PEPCID) 20 MG tablet Take 20 mg by mouth 2 (two) times daily.   Yes [provider]  ferrous sulfate (FERROUSUL) 325 (65 FE) MG tablet Take 1 tablet (325 mg total) by mouth daily with breakfast. 03/18/21  Yes Rehman, Mechele Dawley, MD  furosemide (LASIX) 20 MG tablet Take 20 mg by mouth as needed for fluid or edema. If he gains weight five pounds when taking chemo, take one tab a day till he is back to normal weight or within 2 pounds for fluid check before surgery.   Yes [provider]  gemcitabine 760 mg/m2 in sodium chloride 0.9 % 100 mL Inject 760 mg/m2 into the vein once a week. On days 1 and 8 every 21 days   Yes [provider]  guaifenesin (ROBITUSSIN) 100 MG/5ML syrup Take 200 mg by mouth 3 (three) times daily as needed for cough.   Yes [provider]  lisinopril (ZESTRIL) 2.5 MG tablet Take 1 tablet (2.5 mg total) by mouth daily. 09/11/22 09/06/23 Yes Mallipeddi, Vishnu P, MD  metFORMIN (GLUCOPHAGE) 500 MG tablet Take 500 mg by mouth 2 (two) times daily with a meal.   Yes [provider]  Misc. Devices KIT 1 Box by Does not apply route every 7 (seven) days. 10/20/22   Yes Derek Jack, MD  nitroGLYCERIN (NITROSTAT) 0.4 MG SL tablet Place 1 tablet (0.4 mg total) under the tongue every 5 (five) minutes as needed for chest pain. 09/12/21 11/07/22 Yes Duke, Tami Doyl Bitting, PA  pantoprazole (PROTONIX) 40 MG tablet Take 1 tablet (40 mg total) by mouth daily. 12/05/21  Yes O'Neal, Cassie Freer, MD  prochlorperazine (COMPAZINE) 10 MG tablet TAKE (1) TABLET BY MOUTH EVERY (6) HOURS AS NEEDED. Patient taking differently: Take 10 mg by mouth every 6 (six) hours as needed for nausea or vomiting. TAKE (1) TABLET BY MOUTH EVERY (6) HOURS AS NEEDED. 09/28/22  Yes Derek Jack, MD  sodium chloride flush (NS) 0.9 % SOLN 10 mLs by Intracatheter route every 7 (seven) days. Patient taking differently: 10 mLs by Intracatheter route daily. 10/20/22  Yes Derek Jack, MD  Wound Dressings (INTRASITE GEL APPLIPAK) GEL Apply 1 Units topically daily. 10/05/22   Derek Jack, MD    Discontinued Meds:   Medications Discontinued During This Encounter  Medication Reason   lactated ringers bolus 2,226 mL    vancomycin (VANCOCIN) IVPB 1000 mg/200 mL premix Dose change   Vitamin D, Ergocalciferol, (DRISDOL) 1.25 MG (50000 UNIT) CAPS capsule Discontinued by provider   Ginseng 100 MG CAPS Patient Preference   tamsulosin (FLOMAX) 0.4 MG CAPS capsule Change in therapy   metFORMIN (GLUCOPHAGE) [id number] MG tablet Duplicate   Gemcitabine HCl (GEMZAR IV) Entry Error   Durvalumab (IMFINZI IV) Duplicate   CISPLATIN IV Duplicate   lactated ringers infusion    feeding supplement (GLUCERNA SHAKE) (GLUCERNA SHAKE) liquid 237 mL    vancomycin (VANCOREADY) IVPB 750 mg/150 mL    cefTRIAXone (ROCEPHIN) 2 g in sodium chloride 0.9 % 100 mL IVPB    lactated ringers bolus 1,000 mL One time medication   albumin human 25 % solution 25 g One time medication   lactated ringers infusion Patient Transfer   Omnipaque 300 mg/mL in 0.9% normal saline Patient Discharge   diclofenac suppository  Patient Discharge   Ampicillin-Sulbactam (UNASYN) 3 g in sodium chloride 0.9 % 100  mL IVPB     Social History:  reports that he has been smoking cigarettes. He has a 28.00 pack-year smoking history. He has never used smokeless tobacco. He reports that he does not currently use alcohol. He reports that he does not use drugs.  Family History:   Family History  Problem Relation Age of Onset   CAD Mother    Cancer Neg Hx     Blood pressure 103/67, pulse 82, temperature 97.7 F (36.5 C), temperature source Oral, resp. rate 17, height 5\' 11"  (1.803 m), weight 75.8 kg, SpO2 97 %. General appearance: alert, cooperative, and appears stated age Head: Normocephalic, without obvious abnormality, atraumatic Eyes: negative Neck: no adenopathy, no carotid bruit, supple, symmetrical, trachea midline, and thyroid not enlarged, symmetric, no tenderness/mass/nodules Back: symmetric, no curvature. ROM normal. No CVA tenderness. Resp: clear to auscultation bilaterally Cardio: regular rate and rhythm GI: distended by no rebound and not tender Extremities: extremities normal, atraumatic, no cyanosis or edema Pulses: 2+ and symmetric Skin: Skin color, texture, turgor normal. No rashes or lesions       Hooper Petteway, Hunt Oris, MD 11/11/2022, 1:15 PM

## 2022-11-11 NOTE — Progress Notes (Signed)
Mobility Specialist Progress Note   11/11/22 1127  Mobility  Activity Refused mobility   Declined for unspecified reasons. Will continue to follow.  [place] Marcellina Jonsson, BS EXP Mobility Specialist Please contact via SecureChat or Rehab office at [phone]

## 2022-11-12 ENCOUNTER — Inpatient Hospital Stay (HOSPITAL_COMMUNITY): Payer: Medicare Other

## 2022-11-12 DIAGNOSIS — N179 Acute kidney failure, unspecified: Secondary | ICD-10-CM | POA: Diagnosis not present

## 2022-11-12 DIAGNOSIS — C221 Intrahepatic bile duct carcinoma: Secondary | ICD-10-CM | POA: Diagnosis not present

## 2022-11-12 DIAGNOSIS — K831 Obstruction of bile duct: Secondary | ICD-10-CM | POA: Diagnosis not present

## 2022-11-12 DIAGNOSIS — E782 Mixed hyperlipidemia: Secondary | ICD-10-CM

## 2022-11-12 DIAGNOSIS — R7881 Bacteremia: Secondary | ICD-10-CM

## 2022-11-12 DIAGNOSIS — R140 Abdominal distension (gaseous): Secondary | ICD-10-CM | POA: Diagnosis not present

## 2022-11-12 DIAGNOSIS — C7889 Secondary malignant neoplasm of other digestive organs: Secondary | ICD-10-CM | POA: Diagnosis not present

## 2022-11-12 DIAGNOSIS — A4150 Gram-negative sepsis, unspecified: Secondary | ICD-10-CM | POA: Diagnosis not present

## 2022-11-12 LAB — COMPREHENSIVE METABOLIC PANEL
ALT: 102 U/L — ABNORMAL HIGH (ref 0–44)
AST: 110 U/L — ABNORMAL HIGH (ref 15–41)
Albumin: <1.5 g/dL — ABNORMAL LOW (ref 3.5–5.0)
Alkaline Phosphatase: 399 U/L — ABNORMAL HIGH (ref 38–126)
Anion gap: 9 (ref 5–15)
BUN: 53 mg/dL — ABNORMAL HIGH (ref 8–23)
CO2: 21 mmol/L — ABNORMAL LOW (ref 22–32)
Calcium: 7.9 mg/dL — ABNORMAL LOW (ref 8.9–10.3)
Chloride: 103 mmol/L (ref 98–111)
Creatinine, Ser: 3.77 mg/dL — ABNORMAL HIGH (ref 0.61–1.24)
GFR, Estimated: 17 mL/min — ABNORMAL LOW (ref >60)
Glucose, Bld: 157 mg/dL — ABNORMAL HIGH (ref 70–99)
Potassium: 4.1 mmol/L (ref 3.5–5.1)
Sodium: 133 mmol/L — ABNORMAL LOW (ref 135–145)
Total Bilirubin: 2.6 mg/dL — ABNORMAL HIGH (ref 0.3–1.2)
Total Protein: 5 g/dL — ABNORMAL LOW (ref 6.5–8.1)

## 2022-11-12 LAB — CBC WITH DIFFERENTIAL/PLATELET
Abs Immature Granulocytes: 0.06 10*3/uL (ref 0.00–0.07)
Basophils Absolute: 0 10*3/uL (ref 0.0–0.1)
Basophils Relative: 0 %
Eosinophils Absolute: 0.3 10*3/uL (ref 0.0–0.5)
Eosinophils Relative: 3 %
HCT: 27.5 % — ABNORMAL LOW (ref 39.0–52.0)
Hemoglobin: 9.3 g/dL — ABNORMAL LOW (ref 13.0–17.0)
Immature Granulocytes: 1 %
Lymphocytes Relative: 11 %
Lymphs Abs: 1.1 10*3/uL (ref 0.7–4.0)
MCH: 33 pg (ref 26.0–34.0)
MCHC: 33.8 g/dL (ref 30.0–36.0)
MCV: 97.5 fL (ref 80.0–100.0)
Monocytes Absolute: 1.2 10*3/uL — ABNORMAL HIGH (ref 0.1–1.0)
Monocytes Relative: 12 %
Neutro Abs: 7.2 10*3/uL (ref 1.7–7.7)
Neutrophils Relative %: 73 %
Platelets: 80 10*3/uL — ABNORMAL LOW (ref 150–400)
RBC: 2.82 MIL/uL — ABNORMAL LOW (ref 4.22–5.81)
RDW: 17 % — ABNORMAL HIGH (ref 11.5–15.5)
WBC: 10 10*3/uL (ref 4.0–10.5)
nRBC: 0 % (ref 0.0–0.2)

## 2022-11-12 MED ORDER — SODIUM CHLORIDE 0.9 % IV SOLN: INTRAVENOUS | Status: DC

## 2022-11-12 MED ORDER — PANTOPRAZOLE SODIUM 40 MG PO TBEC
40.0000 mg | DELAYED_RELEASE_TABLET | Freq: Every day | ORAL | Status: DC
  Administered 2022-11-12 – 2022-11-15 (×4): 40 mg via ORAL
  Filled 2022-11-12 (×4): qty 1

## 2022-11-12 NOTE — Progress Notes (Signed)
[facility] Progress Note   67 y.o. 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 Patient also had a paracentesis 11/08/2022 with removal of 1.2L clear yellow fluid with negative for Gram stain and culture as well.  Fever present with hypotension and E. Faecalis bacteremia.   Assessment/ Plan:   Acute renal failure with creatinine in the 1-1.2 range in 2023, as late as 07/08/22 and since then has been slowly rising to 1.3-1.44 09/2022 and then 1.3-1.6 in 11/02/2022. Since then creatinine was has steadily risen since hospitalization with a presenting creatinine that initially improved from 2.9 -> 2.28 (4/1). Patient's SBP has bee in the 80-100's range.  Patient is neg 1719mL during this hospitalization. Patient likely in ATN from hypotension and infection with Enterococcus faecalis bacteremia + contrast which can enter the venous system through trauma or biliary circulation.  TB peaked at 3.3 and certainly possible the patient may also have bile acid nephropathy but TB has decreased since peaking at 3.3.    - At this time no indication for dialysis  and will follow closely with you. No labs this AM but pt is in ATN based on history and urine microscopic exam. - I updated the pt and spouse who was bedside; no absolute indication for dialysis at this time. Hopefully his function will turn around before having to make that decision. - Will also give a liter of NS; he appears on the dry side. Do not want to give more than a liter as certainly at  risk for developing ascites.   -Maintain MAP>65 for optimal renal perfusion.  - Avoid nephrotoxic medications including NSAIDs and iodinated intravenous contrast exposure unless the latter is absolutely indicated.   - Preferred narcotic agents for pain control are hydromorphone, fentanyl, and methadone. Morphine should not be used.  - Avoid Baclofen and avoid oral sodium phosphate and magnesium citrate based laxatives / bowel preps.  - Continue strict Input and Output monitoring. Will monitor the patient closely with you and intervene or adjust therapy as indicated by changes in clinical status/labs   Unresectable cholangiocarcinoma s/p portal vein embolization 08/2020 and subsequent stent placement in 20/2022 and 10/2022.  Last chemotherapy was in late 2023.  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  Subjective:   Feeling better but appetite still poor. Denies f/c/n/v/sob.   Objective:   BP 101/67 (BP Location: Left Arm)   Pulse 83   Temp 97.6 F (36.4 C) (Oral)   Resp 17   Ht 5\' 11"  (1.803 m)   Wt 75.8 kg   SpO2 98%   BMI 23.31 kg/m   Intake/Output Summary (Last 24 hours) at 11/12/2022 0927 Last data filed at 11/12/2022 0300 Gross per 24 hour  Intake 942.22 ml  Output 250 ml  Net 692.22 ml   Weight change:   Physical Exam: GEN: NAD, A&Ox3, NCAT HEENT: No conjunctival pallor, EOMI NECK: Supple, no thyromegaly LUNGS: CTA B/L no rales, rhonchi or wheezing CV: RRR, No M/R/G ABD: SNT +BS, distended but no rebound  EXT: No lower extremity edema GU: Foley in place   Imaging: ECHOCARDIOGRAM COMPLETE  Result Date: 11/10/2022    ECHOCARDIOGRAM REPORT   Patient Name:   Jonathan Keith Date of Exam: 11/10/2022 Medical Rec #:  [id number]       Height:       71.0 in Accession #:    [id number]      Weight:       167.1 lb Date of Birth:  11/30/1955       BSA:          1.954 m Patient Age:    60 years        BP:            101/72 mmHg Patient Gender: M               HR:           74 bpm. Exam Location:  Inpatient Procedure: 2D Echo, Color Doppler, Cardiac Doppler and Intracardiac            Opacification Agent Indications:    Bacteremia  History:        Patient has prior history of Echocardiogram examinations, most                 recent 09/14/2022. CHF, CAD; Risk Factors:Hypertension, Diabetes                 and Dyslipidemia.  Sonographer:    Raquel Sarna Senior RDCS Referring Phys: [phone] RALPH A NETTEY  Sonographer Comments: Technically difficult due to significantly more lung interference than prior studies. IMPRESSIONS  1. Left ventricular ejection fraction, by estimation, is 40 to 45%. The left ventricle has mildly decreased function. The left ventricle demonstrates regional wall motion abnormalities (see scoring diagram/findings for description). Left ventricular diastolic parameters are consistent with Grade I diastolic dysfunction (impaired relaxation).  2. Right ventricular systolic function is normal. The right ventricular size is normal.  3. The mitral valve is grossly normal. Trivial mitral valve regurgitation.  4. The aortic valve is grossly normal. Aortic valve regurgitation is not visualized. Comparison(s): No significant change from prior study. Conclusion(s)/Recommendation(s): No evidence of valvular vegetations on this transthoracic echocardiogram. Consider a transesophageal echocardiogram to exclude infective endocarditis if clinically indicated. FINDINGS  Left Ventricle: Left ventricular ejection fraction, by estimation, is 40 to 45%. The left ventricle has mildly decreased function. The left ventricle demonstrates regional wall motion abnormalities. Definity contrast agent was given IV to delineate the left ventricular endocardial borders. The left ventricular internal cavity size was normal in size. There is no left ventricular hypertrophy. Left ventricular diastolic parameters are consistent with Grade I diastolic  dysfunction (impaired relaxation).  LV Wall Scoring: The basal inferior segment is aneurysmal. The basal inferolateral segment is dyskinetic. The mid inferolateral segment, mid inferoseptal segment, mid inferior segment, and basal inferoseptal segment are hypokinetic. The entire anterior wall, antero-lateral wall, entire anterior septum, and entire apex are normal. Right Ventricle: The right ventricular size is normal. Right vetricular wall thickness was not well visualized. Right ventricular systolic function is normal. Left Atrium: Left atrial size was normal in size. Right Atrium: Right atrial size was normal in size. Pericardium: There is no evidence of pericardial effusion. Mitral Valve: The mitral valve is grossly normal. Trivial mitral valve regurgitation. Tricuspid Valve: The tricuspid valve is not well visualized. Tricuspid valve regurgitation is not demonstrated. Aortic Valve: The aortic valve is grossly normal. Aortic valve regurgitation is not visualized. Pulmonic Valve: The pulmonic valve  was not well visualized. Pulmonic valve regurgitation is not visualized. Aorta: The aortic root was not well visualized, the ascending aorta was not well visualized and the aortic arch was not well visualized. IAS/Shunts: No atrial level shunt detected by color flow Doppler.   LV Volumes (MOD) LV vol d, MOD A2C: 191.0 ml Diastology LV vol d, MOD A4C: 143.0 ml LV e' medial:    4.35 cm/s LV vol s, MOD A2C: 119.0 ml LV E/e' medial:  14.5 LV vol s, MOD A4C: 70.2 ml  LV e' lateral:   4.24 cm/s LV SV MOD A2C:     72.0 ml  LV E/e' lateral: 14.9 LV SV MOD A4C:     143.0 ml LV SV MOD BP:      73.5 ml RIGHT VENTRICLE RV S prime:     12.90 cm/s TAPSE (M-mode): 2.8 cm LEFT ATRIUM             Index        RIGHT ATRIUM           Index LA Vol (A2C):   47.1 ml 24.11 ml/m  RA Area:     11.20 cm LA Vol (A4C):   41.9 ml 21.45 ml/m  RA Volume:   21.60 ml  11.06 ml/m LA Biplane Vol: 48.1 ml 24.62 ml/m  AORTIC VALVE LVOT Vmax:   81.60  cm/s LVOT Vmean:  67.000 cm/s LVOT VTI:    0.179 m MITRAL VALVE MV Area (PHT): 4.77 cm    SHUNTS MV Decel Time: 159 msec    Systemic VTI: 0.18 m MV E velocity: 63.00 cm/s MV A velocity: 73.70 cm/s MV E/A ratio:  0.85 Rudean Haskell MD Electronically signed by Rudean Haskell MD Signature Date/Time: 11/10/2022/6:02:05 PM    Final     Labs: BMET Recent Labs  Lab 11/07/22 1213 11/08/22 0357 11/09/22 0343 11/10/22 0855 11/11/22 0550 11/12/22 0819  NA 124*  128* 127* 129* 131* 132* 133*  K 4.5  4.6 3.8 3.6 4.2 4.1 4.1  CL 96*  96* 97* 99 102 101 103  CO2 20* 19* 21* 20* 20* 21*  GLUCOSE 161*  155* 98 153* 139* 167* 157*  BUN 37*  32* 36* 34* 44* 51* 53*  CREATININE 2.51*  2.90* 2.44* 2.28* 2.92* 3.61* 3.77*  CALCIUM 7.9* 7.7* 7.7* 7.9* 8.0* 7.9*  PHOS  --  4.0  --   --   --   --    CBC Recent Labs  Lab 11/07/22 1213 11/08/22 0357 11/09/22 0343 11/10/22 0855 11/11/22 0550 11/12/22 0819  WBC 24.3*   < > 7.6 8.3 12.0* 10.0  NEUTROABS 21.6*  --   --   --   --  7.2  HGB 9.3*  9.2*   < > 8.3* 8.3* 8.7* 9.3*  HCT 27.2*  27.0*   < > 23.8* 23.5* 24.9* 27.5*  MCV 98.2   < > 95.6 95.1 95.8 97.5  PLT 92*   < > 74* 67* 74* 80*   < > = values in this interval not displayed.    Medications:     Chlorhexidine Gluconate Cloth  6 each Topical Daily   diclofenac  100 mg Rectal Once   feeding supplement  237 mL Oral TID BM   multivitamin with minerals  1 tablet Oral Daily   pantoprazole (PROTONIX) IV  40 mg Intravenous Q24H   sodium chloride flush  10-40 mL Intracatheter Q12H      Otelia Santee, MD 11/12/2022, 9:27  AM

## 2022-11-12 NOTE — Progress Notes (Signed)
PROGRESS NOTE    Jonathan Keith  [id number] DOB: May 16, 1956 DOA: 11/07/2022 PCP: Redmond School, MD   Brief Narrative: Jonathan Keith is a 67 y.o. male with a history of metastatic cholangiocarcinoma with biliary obstruction s/p stent placement, hyperlipidemia, CAD, ischemic cardiomyopathy, CHF, CKD stage IV, anemia of chronic disease presented hospital with fever and weakness and was noted to have sepsis secondary to biliary obstruction. Workup revealed enterococcal bacteremia.  GI and ID on board.  ERCP was done for removal of plastic biliary stent by GI 11/09/2022. Plan for percutaneous biliary drain as per IR pending.  Assessment and Plan:  Sepsis likely secondary to biliary obstruction and cholangitis. Currently on IV antibiotic with Unasyn.  Had undergone paracentesis on 11/08/2022 with removal of 1.2 L of clear yellow fluid.  Asctic fluid Gram stain, culture negative.  Absolute PMN count  not upto the criteria for SBP.Patient with known biliary obstruction secondary to known cholangiocarcinoma with recent biliary stent placement. Concern for biliary stent malfunction. GI was consulted and performed ERCP on 4/1, patient underwent removal of plastic stent with biliary tree sweep and removal of sludge. IR consulted for  percutaneous bile duct drain placement while getting antibiotics.   Enterococcus faecalis bacteremia ID was consulted and patient is on Unasyn. Repeat blood cultures (4/1) with no growth to date.    Malignant ascites/Metastatic cholangiocarcinoma On Unasyn.   Status post paracentesis on 11/08/2022 with removal of 1.2 L of fluid.  Nausea/vomiting Improved.  Continue Zofran..  AKI on CKD stage IV Baseline creatinine of 1.4-1.6. Creatinine of 2.90 on admission.  Creatinine today at 3.7 from 3.6 yesterday.  Continue to monitor closely.  Strict intake and output charting, Daily weights.  Fena suggestive of possible prerenal etiology.  Nephrology has recommended 1 L of IV  fluid today.  Patient has had total negative balance of 1020 mL.  Closely monitor renal function.  Acute on chronic hyponatremia Sodium was as low at 124 on presentation.  Currently at 133.  CAD with history of Ischemic cardiomyopathy Borderline low.  Coreg and lisinopril on hold.  2D echocardiogram on 11/10/2022 showed LV ejection fraction of 40 to 45%.  Not on diuretic at this time.  Prediabetes Latest Hemoglobin A1C of 5.8%.  GERD Continue Protonix  Increased nutrient needs Noted by dietitian. No diagnosis of malnutrition.  Continue Ensure and multivitamins.  DVT prophylaxis: SCDs  Code Status:   Code Status: Full Code  Family Communication:   I again spoke with the patient's wife at bedside.  Disposition Plan:  Home likely in 2-3 days, follow ID recommendations on antibiotic, nephrology follow-up for AKI.  Consultants:  [facility] Infectious disease Interventional radiology Nephrology.  Procedures:  4/1: ERCP  Antimicrobials: Unasyn IV.  Renally dosed   Subjective: Today, patient was seen and examined at bedside.  Patient denies interval complaints but patient's wife at bedside denying any nausea vomiting abdominal pain.    Objective: BP 101/67 (BP Location: Left Arm)   Pulse 83   Temp 97.6 F (36.4 C) (Oral)   Resp 17   Ht 5\' 11"  (1.803 m)   Wt 75.8 kg   SpO2 98%   BMI 23.31 kg/m   Physical examination:  General:  Average built, not in obvious distress, appears chronically ill, HENT:   No scleral pallor or icterus noted. Oral mucosa is moist.  Chest:  Clear breath sounds.  No crackles or wheezes.  CVS: S1 &S2 heard. No murmur.  Regular rate and rhythm. Abdomen: Soft,  nontender, distended with ascites, bowel sounds are heard.  Foley catheter in place.   Extremities: No cyanosis, clubbing or edema.  Peripheral pulses are palpable. Psych: Alert, awake and Communicative, flat affect. CNS:  No cranial nerve deficits.  Moves all  extremities. Skin: Warm and dry.  No rashes noted.  Data Reviewed: I have reviewed the following labs and imaging studies.     CBC Lab Results  Component Value Date   WBC 10.0 11/12/2022   RBC 2.82 (L) 11/12/2022   HGB 9.3 (L) 11/12/2022   HCT 27.5 (L) 11/12/2022   MCV 97.5 11/12/2022   MCH 33.0 11/12/2022   PLT 80 (L) 11/12/2022   MCHC 33.8 11/12/2022   RDW 17.0 (H) 11/12/2022   LYMPHSABS 1.1 11/12/2022   MONOABS 1.2 (H) 11/12/2022   EOSABS 0.3 11/12/2022   BASOSABS 0.0 [id number]     Last metabolic panel Lab Results  Component Value Date   NA 133 (L) 11/12/2022   K 4.1 11/12/2022   CL 103 11/12/2022   CO2 21 (L) 11/12/2022   BUN 53 (H) 11/12/2022   CREATININE 3.77 (H) 11/12/2022   GLUCOSE 157 (H) 11/12/2022   GFRNONAA 17 (L) 11/12/2022   CALCIUM 7.9 (L) 11/12/2022   PHOS 4.0 11/08/2022   PROT 5.0 (L) 11/12/2022   ALBUMIN <1.5 (L) 11/12/2022   LABGLOB 3.1 06/16/2021   AGRATIO 1.2 06/16/2021   BILITOT 2.6 (H) 11/12/2022   ALKPHOS 399 (H) 11/12/2022   AST 110 (H) 11/12/2022   ALT 102 (H) 11/12/2022   ANIONGAP 9 11/12/2022    GFR: Estimated Creatinine Clearance: 20.5 mL/min (A) (by C-G formula based on SCr of 3.77 mg/dL (H)).  Recent Results (from the past 240 hour(s))  Blood Culture (routine x 2)     Status: Abnormal   Collection Time: 11/07/22 12:13 PM   Specimen: Left Antecubital; Blood  Result Value Ref Range Status   Specimen Description   Final    LEFT ANTECUBITAL BOTTLES DRAWN AEROBIC AND ANAEROBIC Performed at [facility], [address], [place], [place] [address]    Special Requests   Final    Blood Culture adequate volume Performed at [facility], [address] [place], [place] [address]    Culture  Setup Time   Final    GRAM POSITIVE COCCI Gram Stain Report Called to,Read Back By and Verified With: LOPEZ,V @ [id number] ON 11/08/22 BY JUW AEROBIC BOTTLE ONLY GS DONE @ [facility] CRITICAL RESULT CALLED TO, READ BACK BY AND VERIFIED WITH: [place] OZ:[id number] AT 0846 BY EC  ANAB REVIEWED BY A. LAFRANCE Performed at [facility], [place] [address] [place], [place] [address]    Culture ENTEROCOCCUS FAECALIS (A)  Final   Report Status 11/10/2022 FINAL  Final   Organism ID, Bacteria ENTEROCOCCUS FAECALIS  Final      Susceptibility   Enterococcus faecalis - MIC*    AMPICILLIN <=2 SENSITIVE Sensitive     VANCOMYCIN 2 SENSITIVE Sensitive     GENTAMICIN SYNERGY SENSITIVE Sensitive     * ENTEROCOCCUS FAECALIS  Blood Culture (routine x 2)     Status: Abnormal   Collection Time: 11/07/22 12:13 PM   Specimen: BLOOD LEFT HAND  Result Value Ref Range Status   Specimen Description   Final    BLOOD LEFT HAND BOTTLES DRAWN AEROBIC AND ANAEROBIC Performed at [facility], [address], [place], [place] [address]    Special Requests   Final    Blood Culture adequate  volume Performed at [facility], [address], [place], [place] [address]    Culture  Setup Time   Final    GRAM POSITIVE COCCI Gram Stain Report Called to,Read Back By and Verified With: B.BARNES @ 0841 BY STEPHTR 11/08/22  REVIEWED BY A. LAFRANCE CRITICAL VALUE NOTED.  VALUE IS CONSISTENT WITH PREVIOUSLY REPORTED AND CALLED VALUE. IN BOTH AEROBIC AND ANAEROBIC BOTTLES    Culture (A)  Final    ENTEROCOCCUS FAECALIS SUSCEPTIBILITIES PERFORMED ON PREVIOUS CULTURE WITHIN THE LAST 5 DAYS. Performed at [facility], [place] [address] [place], [place] [address]    Report Status 11/10/2022 FINAL  Final  Blood Culture ID Panel (Reflexed)     Status: Abnormal   Collection Time: 11/07/22 12:13 PM  Result Value Ref Range Status   Enterococcus faecalis DETECTED (A) NOT DETECTED Final    Comment: CRITICAL RESULT CALLED TO, READ BACK BY AND VERIFIED WITH: PHARMD CATHY PIERCE OZ:[id number] AT 0846 BY EC    Enterococcus Faecium NOT DETECTED NOT DETECTED Final   Listeria monocytogenes NOT DETECTED NOT DETECTED Final   Staphylococcus species NOT DETECTED NOT DETECTED Final    Staphylococcus aureus (BCID) NOT DETECTED NOT DETECTED Final   Staphylococcus epidermidis NOT DETECTED NOT DETECTED Final   Staphylococcus lugdunensis NOT DETECTED NOT DETECTED Final   Streptococcus species NOT DETECTED NOT DETECTED Final   Streptococcus agalactiae NOT DETECTED NOT DETECTED Final   Streptococcus pneumoniae NOT DETECTED NOT DETECTED Final   Streptococcus pyogenes NOT DETECTED NOT DETECTED Final   A.calcoaceticus-baumannii NOT DETECTED NOT DETECTED Final   Bacteroides fragilis NOT DETECTED NOT DETECTED Final   Enterobacterales NOT DETECTED NOT DETECTED Final   Enterobacter cloacae complex NOT DETECTED NOT DETECTED Final   Escherichia coli NOT DETECTED NOT DETECTED Final   Klebsiella aerogenes NOT DETECTED NOT DETECTED Final   Klebsiella oxytoca NOT DETECTED NOT DETECTED Final   Klebsiella pneumoniae NOT DETECTED NOT DETECTED Final   Proteus species NOT DETECTED NOT DETECTED Final   Salmonella species NOT DETECTED NOT DETECTED Final   Serratia marcescens NOT DETECTED NOT DETECTED Final   Haemophilus influenzae NOT DETECTED NOT DETECTED Final   Neisseria meningitidis NOT DETECTED NOT DETECTED Final   Pseudomonas aeruginosa NOT DETECTED NOT DETECTED Final   Stenotrophomonas maltophilia NOT DETECTED NOT DETECTED Final   Candida albicans NOT DETECTED NOT DETECTED Final   Candida auris NOT DETECTED NOT DETECTED Final   Candida glabrata NOT DETECTED NOT DETECTED Final   Candida krusei NOT DETECTED NOT DETECTED Final   Candida parapsilosis NOT DETECTED NOT DETECTED Final   Candida tropicalis NOT DETECTED NOT DETECTED Final   Cryptococcus neoformans/gattii NOT DETECTED NOT DETECTED Final   Vancomycin resistance NOT DETECTED NOT DETECTED Final    Comment: Performed at [facility], [address] [place], [place] [address]  Resp panel by RT-PCR (RSV, Flu A&B, Covid) Anterior Nasal Swab     Status: None   Collection Time: 11/07/22  1:00 PM   Specimen: Anterior Nasal Swab   Result Value Ref Range Status   SARS Coronavirus 2 by RT PCR NEGATIVE NEGATIVE Final    Comment: (NOTE) SARS-CoV-2 target nucleic acids are NOT DETECTED.  The SARS-CoV-2 RNA is generally detectable in upper respiratory specimens during the acute phase of infection. The lowest concentration of SARS-CoV-2 viral copies this assay can detect is 138 copies/mL. A negative result does not preclude SARS-Cov-2 infection and should not be used as the sole basis for treatment or other patient management  decisions. A negative result may occur with  improper specimen collection/handling, submission of specimen other than nasopharyngeal swab, presence of viral mutation(s) within the areas targeted by this assay, and inadequate number of viral copies(<138 copies/mL). A negative result must be combined with clinical observations, patient history, and epidemiological information. The expected result is Negative.  Fact Sheet for Patients:  EntrepreneurPulse.com.au  Fact Sheet for Healthcare Providers:  IncredibleEmployment.be  This test is no t yet approved or cleared by the [place] FDA and  has been authorized for detection and/or diagnosis of SARS-CoV-2 by FDA under an Emergency Use Authorization (EUA). This EUA will remain  in effect (meaning this test can be used) for the duration of the COVID-19 declaration under Section 564(b)(1) of the Act, 21 U.S.C.section 360bbb-3(b)(1), unless the authorization is terminated  or revoked sooner.       Influenza A by PCR NEGATIVE NEGATIVE Final   Influenza B by PCR NEGATIVE NEGATIVE Final    Comment: (NOTE) The Xpert Xpress SARS-CoV-2/FLU/RSV plus assay is intended as an aid in the diagnosis of influenza from Nasopharyngeal swab specimens and should not be used as a sole basis for treatment. Nasal washings and aspirates are unacceptable for Xpert Xpress SARS-CoV-2/FLU/RSV testing.  Fact Sheet for  Patients: EntrepreneurPulse.com.au  Fact Sheet for Healthcare Providers: IncredibleEmployment.be  This test is not yet approved or cleared by the [place] FDA and has been authorized for detection and/or diagnosis of SARS-CoV-2 by FDA under an Emergency Use Authorization (EUA). This EUA will remain in effect (meaning this test can be used) for the duration of the COVID-19 declaration under Section 564(b)(1) of the Act, 21 U.S.C. section 360bbb-3(b)(1), unless the authorization is terminated or revoked.     Resp Syncytial Virus by PCR NEGATIVE NEGATIVE Final    Comment: (NOTE) Fact Sheet for Patients: EntrepreneurPulse.com.au  Fact Sheet for Healthcare Providers: IncredibleEmployment.be  This test is not yet approved or cleared by the [place] FDA and has been authorized for detection and/or diagnosis of SARS-CoV-2 by FDA under an Emergency Use Authorization (EUA). This EUA will remain in effect (meaning this test can be used) for the duration of the COVID-19 declaration under Section 564(b)(1) of the Act, 21 U.S.C. section 360bbb-3(b)(1), unless the authorization is terminated or revoked.  Performed at [facility], [address] [place], [place] [address]   Gram stain     Status: None   Collection Time: 11/08/22  1:34 PM   Specimen: PATH Cytology Peritoneal fluid  Result Value Ref Range Status   Specimen Description PERITONEAL  Final   Special Requests NONE  Final   Gram Stain   Final    RARE WBC PRESENT, PREDOMINANTLY PMN NO ORGANISMS SEEN Performed at [facility], [place] [address], [place], [place] [address]    Report Status 11/08/2022 FINAL  Final  Culture, body fluid w Gram Stain-bottle     Status: None (Preliminary result)   Collection Time: 11/08/22  1:34 PM   Specimen: Peritoneal Washings  Result Value Ref Range Status   Specimen Description PERITONEAL  Final   Special  Requests NONE  Final   Culture   Final    NO GROWTH 4 DAYS Performed at [place] [address] [place],  [address]    Report Status PENDING  Incomplete  Surgical pcr screen     Status: None   Collection Time: 11/09/22  7:09 AM   Specimen: Nasal Mucosa; Nasal Swab  Result Value Ref Range  Status   MRSA, PCR NEGATIVE NEGATIVE Final   Staphylococcus aureus NEGATIVE NEGATIVE Final    Comment: (NOTE) The Xpert SA Assay (FDA approved for NASAL specimens in patients 69 years of age and older), is one component of a comprehensive surveillance program. It is not intended to diagnose infection nor to guide or monitor treatment. Performed at [facility], [place] [address], [place], [place] [address]   Culture, blood (Routine X 2) w Reflex to ID Panel     Status: None (Preliminary result)   Collection Time: 11/09/22 10:06 AM   Specimen: BLOOD LEFT ARM  Result Value Ref Range Status   Specimen Description BLOOD LEFT ARM  Final   Special Requests   Final    BOTTLES DRAWN AEROBIC AND ANAEROBIC Blood Culture adequate volume   Culture   Final    NO GROWTH 3 DAYS Performed at [facility], [address] [place], [place] [address]    Report Status PENDING  Incomplete  Culture, blood (Routine X 2) w Reflex to ID Panel     Status: None (Preliminary result)   Collection Time: 11/09/22 10:10 AM   Specimen: BLOOD LEFT HAND  Result Value Ref Range Status   Specimen Description BLOOD LEFT HAND  Final   Special Requests   Final    BOTTLES DRAWN AEROBIC AND ANAEROBIC Blood Culture adequate volume   Culture   Final    NO GROWTH 3 DAYS Performed at [facility], [place] [address] [place],  [address]    Report Status PENDING  Incomplete      Radiology Studies: ECHOCARDIOGRAM COMPLETE  Result Date: 11/10/2022    ECHOCARDIOGRAM REPORT   Patient Name:   [name] Date of Exam: 11/10/2022 Medical Rec #:  [id number]       Height:       71.0 in Accession #:     [id number]      Weight:       167.1 lb Date of Birth:  [birth year]       BSA:          1.954 m Patient Age:    60 years        BP:           101/72 mmHg Patient Gender: M               HR:           74 bpm. Exam Location:  Inpatient Procedure: 2D Echo, Color Doppler, Cardiac Doppler and Intracardiac            Opacification Agent Indications:    Bacteremia  History:        Patient has prior history of Echocardiogram examinations, most                 recent 09/14/2022. CHF, CAD; Risk Factors:Hypertension, Diabetes                 and Dyslipidemia.  Sonographer:    Raquel Sarna Senior RDCS Referring Phys: [phone] RALPH A NETTEY  Sonographer Comments: Technically difficult due to significantly more lung interference than prior studies. IMPRESSIONS  1. Left ventricular ejection fraction, by estimation, is 40 to 45%. The left ventricle has mildly decreased function. The left ventricle demonstrates regional wall motion abnormalities (see scoring diagram/findings for description). Left ventricular diastolic parameters are consistent with Grade I diastolic dysfunction (impaired relaxation).  2. Right ventricular systolic function is normal. The right ventricular size is  normal.  3. The mitral valve is grossly normal. Trivial mitral valve regurgitation.  4. The aortic valve is grossly normal. Aortic valve regurgitation is not visualized. Comparison(s): No significant change from prior study. Conclusion(s)/Recommendation(s): No evidence of valvular vegetations on this transthoracic echocardiogram. Consider a transesophageal echocardiogram to exclude infective endocarditis if clinically indicated. FINDINGS  Left Ventricle: Left ventricular ejection fraction, by estimation, is 40 to 45%. The left ventricle has mildly decreased function. The left ventricle demonstrates regional wall motion abnormalities. Definity contrast agent was given IV to delineate the left ventricular endocardial borders. The left ventricular internal cavity size was  normal in size. There is no left ventricular hypertrophy. Left ventricular diastolic parameters are consistent with Grade I diastolic dysfunction (impaired relaxation).  LV Wall Scoring: The basal inferior segment is aneurysmal. The basal inferolateral segment is dyskinetic. The mid inferolateral segment, mid inferoseptal segment, mid inferior segment, and basal inferoseptal segment are hypokinetic. The entire anterior wall, antero-lateral wall, entire anterior septum, and entire apex are normal. Right Ventricle: The right ventricular size is normal. Right vetricular wall thickness was not well visualized. Right ventricular systolic function is normal. Left Atrium: Left atrial size was normal in size. Right Atrium: Right atrial size was normal in size. Pericardium: There is no evidence of pericardial effusion. Mitral Valve: The mitral valve is grossly normal. Trivial mitral valve regurgitation. Tricuspid Valve: The tricuspid valve is not well visualized. Tricuspid valve regurgitation is not demonstrated. Aortic Valve: The aortic valve is grossly normal. Aortic valve regurgitation is not visualized. Pulmonic Valve: The pulmonic valve was not well visualized. Pulmonic valve regurgitation is not visualized. Aorta: The aortic root was not well visualized, the ascending aorta was not well visualized and the aortic arch was not well visualized. IAS/Shunts: No atrial level shunt detected by color flow Doppler.   LV Volumes (MOD) LV vol d, MOD A2C: 191.0 ml Diastology LV vol d, MOD A4C: 143.0 ml LV e' medial:    4.35 cm/s LV vol s, MOD A2C: 119.0 ml LV E/e' medial:  14.5 LV vol s, MOD A4C: 70.2 ml  LV e' lateral:   4.24 cm/s LV SV MOD A2C:     72.0 ml  LV E/e' lateral: 14.9 LV SV MOD A4C:     143.0 ml LV SV MOD BP:      73.5 ml RIGHT VENTRICLE RV S prime:     12.90 cm/s TAPSE (M-mode): 2.8 cm LEFT ATRIUM             Index        RIGHT ATRIUM           Index LA Vol (A2C):   47.1 ml 24.11 ml/m  RA Area:     11.20 cm LA Vol  (A4C):   41.9 ml 21.45 ml/m  RA Volume:   21.60 ml  11.06 ml/m LA Biplane Vol: 48.1 ml 24.62 ml/m  AORTIC VALVE LVOT Vmax:   81.60 cm/s LVOT Vmean:  67.000 cm/s LVOT VTI:    0.179 m MITRAL VALVE MV Area (PHT): 4.77 cm    SHUNTS MV Decel Time: 159 msec    Systemic VTI: 0.18 m MV E velocity: 63.00 cm/s MV A velocity: 73.70 cm/s MV E/A ratio:  0.85 Rudean Haskell MD Electronically signed by Rudean Haskell MD Signature Date/Time: 11/10/2022/6:02:05 PM    Final       LOS: 5 days    Flora Lipps, MD [facility] 11/12/2022, 10:56 AM If 7PM-7AM, please contact night-coverage [email or web address]

## 2022-11-12 NOTE — Progress Notes (Signed)
Physical Therapy Treatment Patient Details Name: Jonathan Keith MRN: [id number] DOB: [birth year] Today's Date: 11/12/2022   History of Present Illness 67 y.o. male presents to [facility] hospital on 11/07/2022 with 3 day onset of fever, abdominal pain and weakness. Pt admitted for management of sepsis 2.2 biliary obstruction. Pt underwent ERCP with removal of one stent on 4/1. PMH includes metastatic cholangiocarcinoma, HLD,CAD< CKD4, LV thrombosis, AOCD.    PT Comments    Tolerated treatment well, progressing with functional goals. Ambulating 200 feet with RW today, prefers device at this time for support and confidence. LEs fatigue with gait but no LOB or buckling with RW for light support. Reviewed LE exercises and encouraged regularly perform while admitted. Patient will continue to benefit from skilled physical therapy services to further improve independence with functional mobility.    Recommendations for follow up therapy are one component of a multi-disciplinary discharge planning process, led by the attending physician.  Recommendations may be updated based on patient status, additional functional criteria and insurance authorization.     Assistance Recommended at Discharge PRN  Patient can return home with the following A little help with bathing/dressing/bathroom;Assistance with cooking/housework;Assist for transportation;Help with stairs or ramp for entrance   Equipment Recommendations  None recommended by PT (owns necessary DME)    Recommendations for Other Services       Precautions / Restrictions Precautions Precautions: Fall Restrictions Weight Bearing Restrictions: No     Mobility  Bed Mobility Overal bed mobility: Modified Independent             General bed mobility comments: increased time    Transfers Overall transfer level: Needs assistance Equipment used: Rolling walker (2 wheels) Transfers: Sit to/from Stand Sit to Stand: Min guard           General  transfer comment: [place] [employer], cues for technique. RW to stabilize once upright    Ambulation/Gait Ambulation/Gait assistance: Supervision Gait Distance (Feet): 200 Feet Assistive device: Rolling walker (2 wheels) Gait Pattern/deviations: Step-through pattern Gait velocity: functional Gait velocity interpretation: <1.8 ft/sec, indicate of risk for recurrent falls   General Gait Details: RW for light support, prefers device use today. Reports LE fatigue. VSS throughout. No LOB with this device. Supervision for safety.   Stairs             Wheelchair Mobility    Modified Rankin (Stroke Patients Only)       Balance Overall balance assessment: Mild deficits observed, not formally tested Sitting-balance support: No upper extremity supported, Feet supported Sitting balance-Leahy Scale: Good     Standing balance support: Single extremity supported, Reliant on assistive device for balance Standing balance-Leahy Scale: Poor                              Cognition Arousal/Alertness: Awake/alert Behavior During Therapy: WFL for tasks assessed/performed Overall Cognitive Status: Within Functional Limits for tasks assessed                                          Exercises General Exercises - Lower Extremity Ankle Circles/Pumps: AROM, Both, 15 reps, Supine Quad Sets: Strengthening, Both, 10 reps, Seated Gluteal Sets: Strengthening, Both, 10 reps, Seated    General Comments General comments (skin integrity, edema, etc.): VSS      Pertinent Vitals/Pain Pain Assessment Pain Assessment: No/denies pain  Home Living                          Prior Function            PT Goals (current goals can now be found in the care plan section) Acute Rehab PT Goals Patient Stated Goal: to return to independence PT Goal Formulation: With patient Time For Goal Achievement: 11/24/22 Potential to Achieve Goals: Good Progress towards  PT goals: Progressing toward goals    Frequency    Min 3X/week      PT Plan Current plan remains appropriate    Co-evaluation              AM-PAC PT "6 Clicks" Mobility   Outcome Measure  Help needed turning from your back to your side while in a flat bed without using bedrails?: None Help needed moving from lying on your back to sitting on the side of a flat bed without using bedrails?: None Help needed moving to and from a bed to a chair (including a wheelchair)?: A Little Help needed standing up from a chair using your arms (e.g., wheelchair or bedside chair)?: A Little Help needed to walk in hospital room?: A Little Help needed climbing 3-5 steps with a railing? : A Little 6 Click Score: 20    End of Session Equipment Utilized During Treatment: Gait belt Activity Tolerance: Patient tolerated treatment well Patient left: in chair;with call bell/phone within reach;with chair alarm set;with family/visitor present Nurse Communication: Mobility status PT Visit Diagnosis: Other abnormalities of gait and mobility (R26.89);Muscle weakness (generalized) (M62.81)     Time: [id number] PT Time Calculation (min) (ACUTE ONLY): 17 min  Charges:  $Gait Training: 8-22 mins                     Candie Mile, PT, DPT Physical Therapist Acute Rehabilitation Services [place]    Ellouise Newer 11/12/2022, 4:25 PM

## 2022-11-12 NOTE — Progress Notes (Addendum)
Daily Progress Note  DOA: 11/07/2022 Hospital Day: 6 Chief Complaint: Recurrent biliary obstruction   ASSESSMENT   1)unresectable cholangiocarcinoma s/p portal vein embolization in 06/2021 with recurrent biliary obstruction and cholangitis s/p stent placement in 05/2021 and 10/2022  -CT 3/33 showed increased ascites and intrahepatic biliary dilation and no pneumobilia raising concern for stent dysfunction -ERCP 4/1 showed partial occlusion of plastic biliary stent that went from CBD to left main hepatic duct, removed.  Previously placed uncovered metal stent was patent.  Biliary tree swept of sludge. -LFTs increased slightly. Discussed this case with Dr. Kathlene Cote from IR who recommended interval imaging and continuing to trend LFTs for now. Patient anatomy for drain placement will be difficult due to presence of ascites and prior portal vein embolization  PLAN   -Continue to trend LFTs -CT A/P w/o contrast today -Antibiotics per ID  Subjective / New events:  Patient denies ab pain or N&V. He is still able to eat   Lab Results: Recent Labs    11/10/22 0855 11/11/22 0550 11/12/22 0819  WBC 8.3 12.0* 10.0  HGB 8.3* 8.7* 9.3*  HCT 23.5* 24.9* 27.5*  PLT 67* 74* 80*   BMET Recent Labs    11/10/22 0855 11/11/22 0550 11/12/22 0819  NA 131* 132* 133*  K 4.2 4.1 4.1  CL 102 101 103  CO2 20* 20* 21*  GLUCOSE 139* 167* 157*  BUN 44* 51* 53*  CREATININE 2.92* 3.61* 3.77*  CALCIUM 7.9* 8.0* 7.9*   LFT Recent Labs    11/12/22 0819  PROT 5.0*  ALBUMIN <1.5*  AST 110*  ALT 102*  ALKPHOS 399*  BILITOT 2.6*   PT/INR No results for input(s): "LABPROT", "INR" in the last 72 hours.   Scheduled inpatient medications:   Chlorhexidine Gluconate Cloth  6 each Topical Daily   diclofenac  100 mg Rectal Once   feeding supplement  237 mL Oral TID BM   multivitamin with minerals  1 tablet Oral Daily   pantoprazole (PROTONIX) IV  40 mg Intravenous Q24H   sodium chloride  flush  10-40 mL Intracatheter Q12H   Continuous inpatient infusions:   sodium chloride 100 mL/hr at 11/12/22 1005   ampicillin-sulbactam (UNASYN) IV 3 g (11/12/22 1124)   PRN inpatient medications: ondansetron **OR** ondansetron (ZOFRAN) IV  Vital signs in last 24 hours: Temp:  [97.6 F (36.4 C)-97.9 F (36.6 C)] 97.6 F (36.4 C) (04/04 0820) Pulse Rate:  [77-83] 83 (04/04 0820) Resp:  [16-17] 17 (04/04 0228) BP: (101-117)/(67-72) 101/67 (04/04 0820) SpO2:  [97 %-99 %] 98 % (04/04 0820) Last BM Date : 11/11/22  Intake/Output Summary (Last 24 hours) at 11/12/2022 1127 Last data filed at 11/12/2022 0300 Gross per 24 hour  Intake 702.22 ml  Output 250 ml  Net 452.22 ml    Intake/Output from previous day: 04/03 0701 - 04/04 0700 In: 942.2 [P.O.:717; I.V.:10; IV Piggyback:215.2] Out: 250 [Urine:250] Intake/Output this shift: No intake/output data recorded.   Physical Exam:  General: Alert male in NAD Heart:  Regular rate and rhythm.  Pulmonary: Normal respiratory effort Abdomen: Soft, nondistended, nontender. Normal bowel sounds. Neurologic: Alert and oriented Psych: Pleasant. Cooperative. Insight appears normal.    Principal Problem:   Metastatic cholangiocarcinoma to bile duct Active Problems:   Mixed hyperlipidemia   Transaminitis   Leukocytosis   Hyponatremia   GERD (gastroesophageal reflux disease)   Hypoalbuminemia due to protein-calorie malnutrition   Sepsis   Hyperbilirubinemia   Dehydration   Nausea and  vomiting   Ischemic cardiomyopathy   Acute kidney injury superimposed on chronic kidney disease   Biliary obstruction   Lactic acidosis   Prediabetes   Malignant ascites   Abdominal distention   Gram-negative bacteremia   Cholangitis   Bile duct stricture     LOS: 5 days   Sharyn Creamer, MD 11/12/2022, 11:27 AM

## 2022-11-12 NOTE — Progress Notes (Signed)
PHARMACIST - PHYSICIAN COMMUNICATION  DR:   Louanne Belton  CONCERNING: IV to Oral Route Change Policy  RECOMMENDATION: This patient is receiving Protonix by the intravenous route.  Based on criteria approved by the Pharmacy and Therapeutics Committee, the intravenous medication(s) is/are being converted to the equivalent oral dose form(s).   DESCRIPTION: These criteria include: The patient is eating (either orally or via tube) and/or has been taking other orally administered medications for a least 24 hours The patient has no evidence of active gastrointestinal bleeding or impaired GI absorption (gastrectomy, short bowel, patient on TNA or NPO).  If you have questions about this conversion, please contact the Pharmacy Department  []   [phone] )  [facility] []   [phone] )  [facility] [x]   [phone] )  [facility] []   [phone] )  [facility] []   [phone] )  [facility]

## 2022-11-12 NOTE — TOC Initial Note (Addendum)
Transition of Care [facility]) - Initial/Assessment Note    Patient Details  Name: Jonathan Keith MRN: [id number] Date of Birth: January 28, 1956  [facility] of [facility]) CM/SW Contact:    Sharin Mons, RN Phone Number: 11/12/2022, 8:23 AM  Clinical Narrative:                 Readmitted with fever and weakness and was noted to have sepsis secondary to biliary obstruction and SBP/ enterococcal bacteremia.     -S/P removal of plastic biliary stent by GI 11/09/2022  From home with wife. Wife is pt's primary caregiver. PTA independent with ADL's, min assist . Pt without DME need. Active with [facility]  (RN). Will need home health order from MD for resumption of services. Pt without transportation issues or medication affordability concerns.  TOC team following and will assist with needs...  Expected Discharge Plan: [place] Barriers to Discharge: Continued Medical Work up   Patient Goals and CMS Choice            Expected Discharge Plan and Services   Discharge Planning Services: CM Consult Post Acute Care Choice: Home Health (States active with [place]) Living arrangements for the past 2 months: [place]: RN [place] Agency: [place] ([place]) Date HH Agency Contacted: 11/12/22 Time [place]: 0822    Prior Living Arrangements/Services Living arrangements for the past 2 months: [place] with:: Spouse Patient language and need for interpreter reviewed:: Yes Do you feel safe going back to the place where you live?: Yes      Need for Family Participation in Patient Care: Yes (Comment) Care giver support system in place?: Yes (comment)   Criminal Activity/Legal Involvement Pertinent to Current Situation/Hospitalization: No - Comment as needed  Activities of Daily Living Home Assistive Devices/Equipment: None ADL Screening (condition at time of  admission) Patient's cognitive ability adequate to safely complete daily activities?: Yes Is the patient deaf or have difficulty hearing?: No Does the patient have difficulty seeing, even when wearing glasses/contacts?: No Does the patient have difficulty concentrating, remembering, or making decisions?: No Patient able to express need for assistance with ADLs?: Yes Does the patient have difficulty dressing or bathing?: Yes Independently performs ADLs?: No Does the patient have difficulty walking or climbing stairs?: Yes Weakness of Legs: Both Weakness of Arms/Hands: Both  Permission Sought/Granted   Permission granted to share information with : Yes, Verbal Permission Granted  Share Information with NAME: Berlie Gayle  Spouse  [id number]           Emotional Assessment Appearance:: Appears stated age Attitude/Demeanor/Rapport: Gracious Affect (typically observed): Accepting Orientation: : Oriented to Self, Oriented to Place, Oriented to  Time, Oriented to Situation Alcohol / Substance Use: Not Applicable Psych Involvement: No (comment)  Admission diagnosis:  Cholangitis [K83.09] Severe sepsis [A41.9, R65.20] Patient Active Problem List   Diagnosis Date Noted   Bile duct stricture 11/09/2022   Gram-negative bacteremia 11/08/2022   Cholangitis 11/08/2022   Metastatic cholangiocarcinoma to bile duct 11/07/2022   Lactic acidosis 11/07/2022   Prediabetes 11/07/2022   Malignant ascites 11/07/2022   Abdominal distention 11/07/2022   History of ERCP 10/24/2022   History of biliary duct stent placement 10/24/2022   Abnormal LFTs 10/24/2022   Biliary obstruction 10/24/2022   Biliary obstruction  due to cancer 10/21/2022   Chronic kidney disease, stage 3a 10/21/2022   Anemia of chronic disease 10/21/2022   Ischemic cardiomyopathy 09/11/2022   Acute kidney injury superimposed on chronic kidney disease 09/11/2022   Lobar pneumonia 11/28/2021   Acute on chronic systolic CHF  (congestive heart failure) 11/27/2021   Coronary artery disease 11/27/2021   Tobacco abuse 11/27/2021   Acute respiratory failure with hypoxia 11/27/2021   Elevated troponin 11/27/2021   NSTEMI (non-ST elevated myocardial infarction) 09/08/2021   ACS (acute coronary syndrome) 09/08/2021   Nausea and vomiting 09/01/2021   Dehydration 07/23/2021   Port-A-Cath in place 07/20/2021   Atherosclerosis of native arteries of extremities with intermittent claudication, bilateral legs 06/30/2021   Abnormal levels of other serum enzymes 06/30/2021   Nicotine dependence, cigarettes, uncomplicated 0000000   Biliary tract cancer 06/12/2021   Hyperbilirubinemia    Sepsis 05/25/2021   SIRS (systemic inflammatory response syndrome) 04/28/2021   Cholangiocarcinoma 04/10/2021   Calculus of bile duct without cholangitis or cholecystitis without obstruction 04/10/2021   Acute febrile illness 03/12/2021   Acute cholecystitis 03/12/2021   Diverticulitis 03/12/2021   Transaminitis 03/12/2021   Leukocytosis 03/12/2021   Hyponatremia 03/12/2021   Hyperglycemia due to diabetes mellitus 03/12/2021   GERD (gastroesophageal reflux disease) 03/12/2021   Hypoalbuminemia due to protein-calorie malnutrition 03/12/2021   Type 2 diabetes mellitus without complications [id number]   Gastroesophageal reflux disease 03/12/2021   Elevated LFTs    Choledocholithiasis    Obstructive jaundice 02/24/2021   Class 1 obesity due to excess calories with body mass index (BMI) of 31.0 to 31.9 in adult 02/24/2021   Controlled diabetes mellitus type 2 with complications [id number]   Mixed hyperlipidemia 02/24/2021   Hypertension 02/24/2021   Tobacco dependence 02/24/2021   Elevated blood-pressure reading, without diagnosis of hypertension 02/24/2021   PCP:  Redmond School, MD Pharmacy:   [facility] [place] [address] Phone: [phone] Fax:  [phone]  [id number] [place], [place] [address] [place] [address] Phone: [phone] Fax: [phone]     Social Determinants of Health (SDOH) Social History: SDOH Screenings   Food Insecurity: No Food Insecurity (10/22/2022)  Housing: Low Risk  (10/22/2022)  Transportation Needs: No Transportation Needs (10/22/2022)  Utilities: Not At Risk (10/22/2022)  Alcohol Screen: Low Risk  (03/07/2021)  Depression (PHQ2-9): Low Risk  (03/07/2021)  Financial Resource Strain: Medium Risk (03/07/2021)  Physical Activity: Insufficiently Active (03/07/2021)  Social Connections: Moderately Integrated (03/07/2021)  Stress: No Stress Concern Present (03/07/2021)  Tobacco Use: High Risk (11/10/2022)   SDOH Interventions:     Readmission Risk Interventions    10/23/2022    2:35 PM 11/28/2021    2:06 PM 05/27/2021   12:06 PM  Readmission Risk Prevention Plan  Transportation Screening Complete Complete Complete  PCP or Specialist Appt within 3-5 Days Complete    Home Care Screening   Complete  Medication Review (RN CM)   Complete  HRI or Home Care Consult Complete    Social Work Consult for [place] Planning/Counseling Complete    Palliative Care Screening Complete    Medication Review Press photographer) Complete Complete   PCP or Specialist appointment within 3-5 days of discharge  Not Complete   HRI or [place]  Complete   SW Recovery Care/Counseling Consult  Complete   Palliative Care Screening  Not [place]  Not Complete

## 2022-11-12 NOTE — Progress Notes (Signed)
Interventional Radiology Brief Note:  IR continues to follow peripherally for potential need for PTC/D.  Case again reviewed by Dr. Kathlene Cote who notes his Tbili remains lower than prior to stent removal. Patient does have challenging anatomy and increased risk with biliary drain placement given his ascites and prior right PV embolization.  Patient to undergo CT today.   Continue to monitor and follow.   Brynda Greathouse, MS RD PA-C

## 2022-11-13 ENCOUNTER — Inpatient Hospital Stay (HOSPITAL_COMMUNITY): Payer: Medicare Other

## 2022-11-13 DIAGNOSIS — A4150 Gram-negative sepsis, unspecified: Secondary | ICD-10-CM | POA: Diagnosis not present

## 2022-11-13 DIAGNOSIS — N179 Acute kidney failure, unspecified: Secondary | ICD-10-CM | POA: Diagnosis not present

## 2022-11-13 DIAGNOSIS — C7889 Secondary malignant neoplasm of other digestive organs: Secondary | ICD-10-CM | POA: Diagnosis not present

## 2022-11-13 DIAGNOSIS — C221 Intrahepatic bile duct carcinoma: Secondary | ICD-10-CM | POA: Diagnosis not present

## 2022-11-13 DIAGNOSIS — R140 Abdominal distension (gaseous): Secondary | ICD-10-CM | POA: Diagnosis not present

## 2022-11-13 DIAGNOSIS — K831 Obstruction of bile duct: Secondary | ICD-10-CM | POA: Diagnosis not present

## 2022-11-13 HISTORY — PX: IR PARACENTESIS: IMG2679

## 2022-11-13 LAB — CBC
HCT: 25.3 % — ABNORMAL LOW (ref 39.0–52.0)
Hemoglobin: 8.8 g/dL — ABNORMAL LOW (ref 13.0–17.0)
MCH: 33.5 pg (ref 26.0–34.0)
MCHC: 34.8 g/dL (ref 30.0–36.0)
MCV: 96.2 fL (ref 80.0–100.0)
Platelets: 75 10*3/uL — ABNORMAL LOW (ref 150–400)
RBC: 2.63 MIL/uL — ABNORMAL LOW (ref 4.22–5.81)
RDW: 16.9 % — ABNORMAL HIGH (ref 11.5–15.5)
WBC: 10.6 10*3/uL — ABNORMAL HIGH (ref 4.0–10.5)
nRBC: 0 % (ref 0.0–0.2)

## 2022-11-13 LAB — COMPREHENSIVE METABOLIC PANEL
ALT: 96 U/L — ABNORMAL HIGH (ref 0–44)
AST: 97 U/L — ABNORMAL HIGH (ref 15–41)
Albumin: <1.5 g/dL — ABNORMAL LOW (ref 3.5–5.0)
Alkaline Phosphatase: 341 U/L — ABNORMAL HIGH (ref 38–126)
Anion gap: 7 (ref 5–15)
BUN: 53 mg/dL — ABNORMAL HIGH (ref 8–23)
CO2: 21 mmol/L — ABNORMAL LOW (ref 22–32)
Calcium: 7.8 mg/dL — ABNORMAL LOW (ref 8.9–10.3)
Chloride: 103 mmol/L (ref 98–111)
Creatinine, Ser: 3.59 mg/dL — ABNORMAL HIGH (ref 0.61–1.24)
GFR, Estimated: 18 mL/min — ABNORMAL LOW (ref >60)
Glucose, Bld: 104 mg/dL — ABNORMAL HIGH (ref 70–99)
Potassium: 4.1 mmol/L (ref 3.5–5.1)
Sodium: 131 mmol/L — ABNORMAL LOW (ref 135–145)
Total Bilirubin: 3.1 mg/dL — ABNORMAL HIGH (ref 0.3–1.2)
Total Protein: 5 g/dL — ABNORMAL LOW (ref 6.5–8.1)

## 2022-11-13 LAB — CULTURE, BODY FLUID W GRAM STAIN -BOTTLE: Culture: NO GROWTH

## 2022-11-13 LAB — MAGNESIUM: Magnesium: 2.4 mg/dL (ref 1.7–2.4)

## 2022-11-13 MED ORDER — ALBUMIN HUMAN 25 % IV SOLN
50.0000 g | Freq: Once | INTRAVENOUS | Status: AC
  Administered 2022-11-13: 50 g via INTRAVENOUS
  Filled 2022-11-13: qty 200

## 2022-11-13 MED ORDER — ORAL CARE MOUTH RINSE: 15.0000 mL | OROMUCOSAL | Status: DC | PRN

## 2022-11-13 MED ORDER — BOOST / RESOURCE BREEZE PO LIQD CUSTOM
1.0000 | Freq: Three times a day (TID) | ORAL | Status: DC
  Administered 2022-11-13 – 2022-11-15 (×5): 1 via ORAL

## 2022-11-13 MED ORDER — LIDOCAINE HCL 1 % IJ SOLN
INTRAMUSCULAR | Status: AC
  Filled 2022-11-13: qty 20

## 2022-11-13 NOTE — Progress Notes (Addendum)
[facility]    Date of Admission:  11/07/2022   Total days of antibiotics 7           ID: Jonathan Keith is a 67 y.o. male with  enterococcal bacteremia Principal Problem:   Metastatic cholangiocarcinoma to bile duct Active Problems:   Mixed hyperlipidemia   Transaminitis   Leukocytosis   Hyponatremia   GERD (gastroesophageal reflux disease)   Hypoalbuminemia due to protein-calorie malnutrition   Sepsis   Hyperbilirubinemia   Dehydration   Nausea and vomiting   Ischemic cardiomyopathy   Acute kidney injury superimposed on chronic kidney disease   Biliary obstruction   Lactic acidosis   Prediabetes   Malignant ascites   Abdominal distention   Gram-negative bacteremia   Cholangitis   Bile duct stricture    Subjective: Afebrile. Less abdominal pain. Looking forward to going home  Medications:   Chlorhexidine Gluconate Cloth  6 each Topical Daily   diclofenac  100 mg Rectal Once   feeding supplement  237 mL Oral TID BM   multivitamin with minerals  1 tablet Oral Daily   pantoprazole  40 mg Oral Q supper   sodium chloride flush  10-40 mL Intracatheter Q12H    Objective: Vital signs in last 24 hours: Temp:  [97.6 F (36.4 C)-98.2 F (36.8 C)] 98.1 F (36.7 C) (04/05 0941) Pulse Rate:  [80-94] 89 (04/05 0835) Resp:  [15-17] 16 (04/05 0835) BP: (91-120)/(65-77) 109/69 (04/05 0835) SpO2:  [97 %-100 %] 98 % (04/05 0835)  Physical Exam  Constitutional: He is oriented to person, place, and time. He appears well-developed and well-nourished. No distress.  HENT:  Mouth/Throat: Oropharynx is clear and moist. No oropharyngeal exudate.  Cardiovascular: Normal rate, regular rhythm and normal heart sounds. Exam reveals no gallop and no friction rub.  No murmur heard.  Pulmonary/Chest: Effort normal and breath sounds normal. No respiratory distress. He has no wheezes.  Abdominal: Soft. Bowel sounds are normal. He exhibits no distension. There  is no tenderness.  Lymphadenopathy:  He has no cervical adenopathy.  Neurological: He is alert and oriented to person, place, and time.  Skin: Skin is warm and dry. No rash noted. No erythema.  Psychiatric: He has a normal mood and affect. His behavior is normal.    Lab Results Recent Labs    11/12/22 0819 11/13/22 0337  WBC 10.0 10.6*  HGB 9.3* 8.8*  HCT 27.5* 25.3*  NA 133* 131*  K 4.1 4.1  CL 103 103  CO2 21* 21*  BUN 53* 53*  CREATININE 3.77* 3.59*   Liver Panel Recent Labs    11/12/22 0819 11/13/22 0337  PROT 5.0* 5.0*  ALBUMIN <1.5* <1.5*  AST 110* 97*  ALT 102* 96*  ALKPHOS 399* 341*  BILITOT 2.6* 3.1*    Microbiology: 3/30 blood cx e.faecalis 4/01 blood cx NGTD Studies/Results: CT ABDOMEN PELVIS WO CONTRAST  Result Date: 11/12/2022 CLINICAL DATA:  History of unresectable cholangiocarcinoma status post portal vein embolization 06/2021 and stent placement on 05/2021 and 10/2022, most recently status post ERCP 11/09/2022 with elevated liver function tests EXAM: CT ABDOMEN AND PELVIS WITHOUT CONTRAST TECHNIQUE: Multidetector CT imaging of the abdomen and pelvis was performed following the standard protocol without IV contrast. RADIATION DOSE REDUCTION: This exam was performed according to the departmental dose-optimization program which includes automated exposure control, adjustment of the mA and/or kV according to patient size and/or use of iterative reconstruction technique. COMPARISON:  CT abdomen pelvis dated  11/07/2022 FINDINGS: Lower chest: Unchanged subpleural 3 mm right middle lobe nodule (5:3). New small right pleural effusion. Partially imaged heart size is normal. Coronary artery calcifications. Hepatobiliary: Prior embolization of known intrahepatic cholangiocarcinoma. No new focal hypoattenuating lesions. Similar intrahepatic bile duct dilation with metallic biliary stent in-situ in unchanged position. Interval removal of plastic stent. Normal gallbladder.  Pancreas: No focal lesions or main ductal dilation. Spleen: Normal in size without focal abnormality. Adrenals/Urinary Tract: No adrenal nodules. No suspicious renal mass, calculi or hydronephrosis. Mildly distended urinary bladder with catheter in intraluminal gas. Mobile calcified stones within the bladder. Stomach/Bowel: Normal appearance of the stomach. No evidence of bowel wall thickening, distention, or inflammatory changes. Normal appendix. Vascular/Lymphatic: Aortic atherosclerosis. No enlarged abdominal or pelvic lymph nodes. Reproductive: Prostate is unremarkable. Other: Increased moderate volume ascites. No free air. Previously noted omental and mesenteric nodularity is less conspicuous. Musculoskeletal: No acute or abnormal lytic or blastic osseous lesions. Multilevel degenerative changes of the partially imaged thoracic and lumbar spine. Increased diffuse body wall edema. IMPRESSION: 1. Prior embolization of known intrahepatic cholangiocarcinoma with similar intrahepatic bile duct dilation with metallic biliary stent in-situ in unchanged position. Interval removal of plastic stent. 2. Increased moderate volume ascites and diffuse body wall edema. 3. New small right pleural effusion. 4. Decreased conspicuity of previously noted omental and mesenteric nodularity. Attention on follow-up. 5.  Aortic Atherosclerosis (ICD10-I70.0). Electronically Signed   By: Agustin Cree M.D.   On: 11/12/2022 12:57     Assessment/Plan: Enterococcal bacteremia- thought to be related to gi source. continue on amp/sub while hospitalized. Then convert to oral amoxicillin 1gm TID at discharge. Can discharge to take oral abtx through 11/22/2022 to complete treatment for enterococcal bacteremia.   Discussed treatment plan with family and primary provider  Will sign off  I have personally spent 32 minutes involved in face-to-face and non-face-to-face activities for this patient on the day of the visit. Professional time spent  includes the following activities: Preparing to see the patient (review of tests), Obtaining and/or reviewing separately obtained history (admission/discharge record), Performing a medically appropriate examination and/or evaluation , Ordering medications/tests/procedures, referring and communicating with other health care professionals, Documenting clinical information in the EMR, Independently interpreting results (not separately reported), Communicating results to the patient/family/caregiver, Counseling and educating the patient/family/caregiver and Care coordination (not separately reported).    [facility] Pager: [phone]  11/13/2022, 10:35 AM

## 2022-11-13 NOTE — Progress Notes (Signed)
Nutrition Follow-up  DOCUMENTATION CODES:   Not applicable  INTERVENTION:  Liberalize diet- Hospitalist changed diet to regular and then GI changed it back **after paracentesis, please consider changing patient's diet back to regular** Boost Breeze po TID, each supplement provides 250 kcal and 9 grams of protein Encouraged po intake  Education on adequate nutrition   NUTRITION DIAGNOSIS:   Increased nutrient needs related to cancer and cancer related treatments as evidenced by estimated needs.  GOAL:   Patient will meet greater than or equal to 90% of their needs -ongoing   MONITOR:   PO intake, Supplement acceptance, Diet advancement  REASON FOR ASSESSMENT:   Consult Assessment of nutrition requirement/status  ASSESSMENT:   Pt with medical history significant of metastatic cholangiocarcinoma with biliary obstruction and recent stent placement, hyperlipidemia, CAD with ischemic cardiomyopathy (LVEF 45 to 50%;  +RWMA - 09/14/2022), AKI on CKD 4, recent LV thrombosis s/p treatment with Coumadin, AOCD who presents due to 3-day onset of fever and generalized weakness.   Labs:  Na 131, glu 104, BUN 53, Cr 3.59, alk phos 341 Meds: unasyn, MVI with minerals, protonix, NS  Wt: admit wt- 167#; no CBW documented  -patient's weight was stable PTA  PO: 43% avg meal intake x last 7 documented meals  I/O's:  +373.5 mL   Visited patient whose family was present. They gave all the history while he ate lunch (an orange ice cream cup). Patient had a chef salad and fruit cup in front of him but he could not get thousand [place] which is the only way he would eat his salad. Patient is missing several teeth but denies chewing issues unless it's peanuts.   Family reports he has had significant taste changes and does not like to eat meat these days. He does not like the Ensure and prefers the boost breeze, RD to change order.   Patient's wife reports he keeps an emesis bag next to him due to  intermittent nausea. She also reports after he eats, he has a bowel movement shortly afterwards.   Plans for paracentesis today.   NUTRITION - FOCUSED PHYSICAL EXAM:  Flowsheet Row Most Recent Value  Orbital Region Severe depletion  Upper Arm Region Severe depletion  Thoracic and Lumbar Region Unable to assess  Buccal Region Severe depletion  Temple Region Severe depletion  Clavicle Bone Region Severe depletion  Clavicle and Acromion Bone Region Severe depletion  Scapular Bone Region Unable to assess  Dorsal Hand Severe depletion  Patellar Region Moderate depletion  Anterior Thigh Region Moderate depletion  Posterior Calf Region Moderate depletion  Edema (RD Assessment) None  Hair Reviewed  Eyes Reviewed  Mouth Reviewed  Skin Reviewed  Nails Reviewed       Diet Order:   Diet Order             Diet Heart Room service appropriate? Yes; Fluid consistency: Thin  Diet effective now                   EDUCATION NEEDS:   No education needs have been identified at this time  Skin:  Skin Assessment: Skin Integrity Issues: Skin Integrity Issues:: Other (Comment), Incisions Incisions: rt chest Other: skin tear to lt arm, skin tear to rt upper arm  Last BM:  11/06/22  Height:   Ht Readings from Last 1 Encounters:  11/09/22 5\' 11"  (1.803 m)    Weight:   Wt Readings from Last 1 Encounters:  11/09/22 75.8 kg  Ideal Body Weight:  78.2 kg  BMI:  Body mass index is 23.31 kg/m.  Estimated Nutritional Needs:   Kcal:  2250-2450  Protein:  115-130 grams  Fluid:  > 2 L    Leodis Rains, RDN, LDN  Clinical Nutrition

## 2022-11-13 NOTE — Consult Note (Signed)
   [facility] CM Inpatient Consult   11/13/2022  Jonathan Keith Aug 29, 1955 [id number]  [facility] [[facility]]  Accountable Care Organization [ACO] Patient: Medicare ACO REACH  Primary Care Provider:  Elfredia Nevins, MD   Patient screened for hospitalization with noted extreme high risk score for unplanned readmission risk length of stay and to assess for potential [facility]  [[facility]] Care Management service needs for post hospital transition for care coordination.   12:04 pm Met with patient wife, and daughter at the bedside.  Patient getting set up for lunch.  Explained reason for visit and they confirm follow up with PCP and no current needs. Given a reminder appointment card with the 24 hour nurse advise line.  Plan:  Continue to follow progress and disposition to assess for post hospital community care coordination/management needs.  Referral request for community care coordination:  patient has good follow up with oncology team and PCP. No additional follow up assessed at this time.  Of note, [facility] Care Management/Population Health does not replace or interfere with any arrangements made by the Inpatient Transition of Care team.  For questions contact:   Charlesetta Shanks, RN BSN CCM [facility]  [phone] business mobile phone Toll free office [phone]  *Concierge Line  [phone] Fax number: [phone] [email or web address]

## 2022-11-13 NOTE — Progress Notes (Signed)
Referring Physician(s): * No referring provider recorded for this case *  Supervising Physician: Marliss Coots  Patient Status:  [place] - In-pt  Chief Complaint: Cholangiocarcinoma with biliary obstruction  Subjective: Patient resting comfortably in bed.  Assessed in Radiology prior to paracentesis.  States he would like to go home, but would also prefer procedure to discuss with his wife.    Allergies: Patient has no known allergies.  Medications: Prior to Admission medications   Medication Sig Start Date End Date Taking? Authorizing Provider  acetaminophen (TYLENOL) 500 MG tablet Take 500 mg by mouth every 6 (six) hours as needed for moderate pain or headache.   Yes [provider]  albuterol (VENTOLIN HFA) 108 (90 Base) MCG/ACT inhaler Inhale 2 puffs into the lungs every 6 (six) hours as needed for wheezing or shortness of breath. 11/29/21  Yes Tat, Onalee Hua, MD  alfuzosin (UROXATRAL) 10 MG 24 hr tablet Take 1 tablet (10 mg total) by mouth at bedtime. 08/21/22  Yes McKenzie, Mardene Celeste, MD  atorvastatin (LIPITOR) 40 MG tablet Take 1 tablet (40 mg total) by mouth daily at 6 PM. 12/05/21  Yes O'Neal, Ronnald Ramp, MD  carvedilol (COREG) 3.125 MG tablet Take 1 tablet (3.125 mg total) by mouth 2 (two) times daily with a meal. 10/25/22 11/24/22 Yes Pahwani, Daleen Bo, MD  CISplatin (PLATINOL) 50 MG/50ML SOLN Inject 50 mg into the vein once a week. On days 1 and 8, every 21 days   Yes [provider]  durvalumab 1,500 mg in sodium chloride 0.9 % 100 mL Inject 1,500 mg into the vein every 21 ( twenty-one) days.   Yes [provider]  famotidine (PEPCID) 20 MG tablet Take 20 mg by mouth 2 (two) times daily.   Yes [provider]  ferrous sulfate (FERROUSUL) 325 (65 FE) MG tablet Take 1 tablet (325 mg total) by mouth daily with breakfast. 03/18/21  Yes Rehman, Joline Maxcy, MD  furosemide (LASIX) 20 MG tablet Take 20 mg by mouth as needed for fluid or edema. If he gains  weight five pounds when taking chemo, take one tab a day till he is back to normal weight or within 2 pounds for fluid check before surgery.   Yes [provider]  gemcitabine 760 mg/m2 in sodium chloride 0.9 % 100 mL Inject 760 mg/m2 into the vein once a week. On days 1 and 8 every 21 days   Yes [provider]  guaifenesin (ROBITUSSIN) 100 MG/5ML syrup Take 200 mg by mouth 3 (three) times daily as needed for cough.   Yes [provider]  lisinopril (ZESTRIL) 2.5 MG tablet Take 1 tablet (2.5 mg total) by mouth daily. 09/11/22 09/06/23 Yes Mallipeddi, Vishnu P, MD  metFORMIN (GLUCOPHAGE) 500 MG tablet Take 500 mg by mouth 2 (two) times daily with a meal.   Yes [provider]  Misc. Devices KIT 1 Box by Does not apply route every 7 (seven) days. 10/20/22  Yes Doreatha Massed, MD  nitroGLYCERIN (NITROSTAT) 0.4 MG SL tablet Place 1 tablet (0.4 mg total) under the tongue every 5 (five) minutes as needed for chest pain. 09/12/21 11/07/22 Yes Duke, Roe Rutherford, PA  pantoprazole (PROTONIX) 40 MG tablet Take 1 tablet (40 mg total) by mouth daily. 12/05/21  Yes O'Neal, Ronnald Ramp, MD  prochlorperazine (COMPAZINE) 10 MG tablet TAKE (1) TABLET BY MOUTH EVERY (6) HOURS AS NEEDED. Patient taking differently: Take 10 mg by mouth every 6 (six) hours as needed for nausea or  vomiting. TAKE (1) TABLET BY MOUTH EVERY (6) HOURS AS NEEDED. 09/28/22  Yes Doreatha Massed, MD  sodium chloride flush (NS) 0.9 % SOLN 10 mLs by Intracatheter route every 7 (seven) days. Patient taking differently: 10 mLs by Intracatheter route daily. 10/20/22  Yes Doreatha Massed, MD  Wound Dressings (INTRASITE GEL APPLIPAK) GEL Apply 1 Units topically daily. 10/05/22   Doreatha Massed, MD     Vital Signs: BP 123/66 (BP Location: Left Arm) Comment: pre-paracentesis  Pulse 83   Temp 98.2 F (36.8 C) (Oral)   Resp 16   Ht 5\' 11"  (1.803 m)   Wt 167 lb 1.7 oz (75.8 kg)   SpO2 98%   BMI  23.31 kg/m   Physical Exam  Imaging: CT ABDOMEN PELVIS WO CONTRAST  Result Date: 11/12/2022 CLINICAL DATA:  History of unresectable cholangiocarcinoma status post portal vein embolization 06/2021 and stent placement on 05/2021 and 10/2022, most recently status post ERCP 11/09/2022 with elevated liver function tests EXAM: CT ABDOMEN AND PELVIS WITHOUT CONTRAST TECHNIQUE: Multidetector CT imaging of the abdomen and pelvis was performed following the standard protocol without IV contrast. RADIATION DOSE REDUCTION: This exam was performed according to the departmental dose-optimization program which includes automated exposure control, adjustment of the mA and/or kV according to patient size and/or use of iterative reconstruction technique. COMPARISON:  CT abdomen pelvis dated 11/07/2022 FINDINGS: Lower chest: Unchanged subpleural 3 mm right middle lobe nodule (5:3). New small right pleural effusion. Partially imaged heart size is normal. Coronary artery calcifications. Hepatobiliary: Prior embolization of known intrahepatic cholangiocarcinoma. No new focal hypoattenuating lesions. Similar intrahepatic bile duct dilation with metallic biliary stent in-situ in unchanged position. Interval removal of plastic stent. Normal gallbladder. Pancreas: No focal lesions or main ductal dilation. Spleen: Normal in size without focal abnormality. Adrenals/Urinary Tract: No adrenal nodules. No suspicious renal mass, calculi or hydronephrosis. Mildly distended urinary bladder with catheter in intraluminal gas. Mobile calcified stones within the bladder. Stomach/Bowel: Normal appearance of the stomach. No evidence of bowel wall thickening, distention, or inflammatory changes. Normal appendix. Vascular/Lymphatic: Aortic atherosclerosis. No enlarged abdominal or pelvic lymph nodes. Reproductive: Prostate is unremarkable. Other: Increased moderate volume ascites. No free air. Previously noted omental and mesenteric nodularity is  less conspicuous. Musculoskeletal: No acute or abnormal lytic or blastic osseous lesions. Multilevel degenerative changes of the partially imaged thoracic and lumbar spine. Increased diffuse body wall edema. IMPRESSION: 1. Prior embolization of known intrahepatic cholangiocarcinoma with similar intrahepatic bile duct dilation with metallic biliary stent in-situ in unchanged position. Interval removal of plastic stent. 2. Increased moderate volume ascites and diffuse body wall edema. 3. New small right pleural effusion. 4. Decreased conspicuity of previously noted omental and mesenteric nodularity. Attention on follow-up. 5.  Aortic Atherosclerosis (ICD10-I70.0). Electronically Signed   By: Agustin Cree M.D.   On: 11/12/2022 12:57   ECHOCARDIOGRAM COMPLETE  Result Date: 11/10/2022    ECHOCARDIOGRAM REPORT   Patient Name:   HARUTYUN ALVERSON Date of Exam: 11/10/2022 Medical Rec #:  [id number]       Height:       71.0 in Accession #:    [id number]      Weight:       167.1 lb Date of Birth:  23-Sep-1955       BSA:          1.954 m Patient Age:    66 years        BP:           101/72  mmHg Patient Gender: M               HR:           74 bpm. Exam Location:  Inpatient Procedure: 2D Echo, Color Doppler, Cardiac Doppler and Intracardiac            Opacification Agent Indications:    Bacteremia  History:        Patient has prior history of Echocardiogram examinations, most                 recent 09/14/2022. CHF, CAD; Risk Factors:Hypertension, Diabetes                 and Dyslipidemia.  Sonographer:    Irving Burton Senior RDCS Referring Phys: [phone] RALPH A NETTEY  Sonographer Comments: Technically difficult due to significantly more lung interference than prior studies. IMPRESSIONS  1. Left ventricular ejection fraction, by estimation, is 40 to 45%. The left ventricle has mildly decreased function. The left ventricle demonstrates regional wall motion abnormalities (see scoring diagram/findings for description). Left ventricular diastolic  parameters are consistent with Grade I diastolic dysfunction (impaired relaxation).  2. Right ventricular systolic function is normal. The right ventricular size is normal.  3. The mitral valve is grossly normal. Trivial mitral valve regurgitation.  4. The aortic valve is grossly normal. Aortic valve regurgitation is not visualized. Comparison(s): No significant change from prior study. Conclusion(s)/Recommendation(s): No evidence of valvular vegetations on this transthoracic echocardiogram. Consider a transesophageal echocardiogram to exclude infective endocarditis if clinically indicated. FINDINGS  Left Ventricle: Left ventricular ejection fraction, by estimation, is 40 to 45%. The left ventricle has mildly decreased function. The left ventricle demonstrates regional wall motion abnormalities. Definity contrast agent was given IV to delineate the left ventricular endocardial borders. The left ventricular internal cavity size was normal in size. There is no left ventricular hypertrophy. Left ventricular diastolic parameters are consistent with Grade I diastolic dysfunction (impaired relaxation).  LV Wall Scoring: The basal inferior segment is aneurysmal. The basal inferolateral segment is dyskinetic. The mid inferolateral segment, mid inferoseptal segment, mid inferior segment, and basal inferoseptal segment are hypokinetic. The entire anterior wall, antero-lateral wall, entire anterior septum, and entire apex are normal. Right Ventricle: The right ventricular size is normal. Right vetricular wall thickness was not well visualized. Right ventricular systolic function is normal. Left Atrium: Left atrial size was normal in size. Right Atrium: Right atrial size was normal in size. Pericardium: There is no evidence of pericardial effusion. Mitral Valve: The mitral valve is grossly normal. Trivial mitral valve regurgitation. Tricuspid Valve: The tricuspid valve is not well visualized. Tricuspid valve regurgitation is  not demonstrated. Aortic Valve: The aortic valve is grossly normal. Aortic valve regurgitation is not visualized. Pulmonic Valve: The pulmonic valve was not well visualized. Pulmonic valve regurgitation is not visualized. Aorta: The aortic root was not well visualized, the ascending aorta was not well visualized and the aortic arch was not well visualized. IAS/Shunts: No atrial level shunt detected by color flow Doppler.   LV Volumes (MOD) LV vol d, MOD A2C: 191.0 ml Diastology LV vol d, MOD A4C: 143.0 ml LV e' medial:    4.35 cm/s LV vol s, MOD A2C: 119.0 ml LV E/e' medial:  14.5 LV vol s, MOD A4C: 70.2 ml  LV e' lateral:   4.24 cm/s LV SV MOD A2C:     72.0 ml  LV E/e' lateral: 14.9 LV SV MOD A4C:     143.0 ml LV SV  MOD BP:      73.5 ml RIGHT VENTRICLE RV S prime:     12.90 cm/s TAPSE (M-mode): 2.8 cm LEFT ATRIUM             Index        RIGHT ATRIUM           Index LA Vol (A2C):   47.1 ml 24.11 ml/m  RA Area:     11.20 cm LA Vol (A4C):   41.9 ml 21.45 ml/m  RA Volume:   21.60 ml  11.06 ml/m LA Biplane Vol: 48.1 ml 24.62 ml/m  AORTIC VALVE LVOT Vmax:   81.60 cm/s LVOT Vmean:  67.000 cm/s LVOT VTI:    0.179 m MITRAL VALVE MV Area (PHT): 4.77 cm    SHUNTS MV Decel Time: 159 msec    Systemic VTI: 0.18 m MV E velocity: 63.00 cm/s MV A velocity: 73.70 cm/s MV E/A ratio:  0.85 Riley LamMahesh Chandrasekhar MD Electronically signed by Riley LamMahesh Chandrasekhar MD Signature Date/Time: 11/10/2022/6:02:05 PM    Final     Labs:  CBC: Recent Labs    11/10/22 0855 11/11/22 0550 11/12/22 0819 11/13/22 0337  WBC 8.3 12.0* 10.0 10.6*  HGB 8.3* 8.7* 9.3* 8.8*  HCT 23.5* 24.9* 27.5* 25.3*  PLT 67* 74* 80* 75*    COAGS: Recent Labs    08/19/22 1341 09/02/22 1359 10/22/22 1147 11/07/22 1213  INR 3.2* 3.0 1.2 1.3*  APTT  --   --   --  39*    BMP: Recent Labs    11/10/22 0855 11/11/22 0550 11/12/22 0819 11/13/22 0337  NA 131* 132* 133* 131*  K 4.2 4.1 4.1 4.1  CL 102 101 103 103  CO2 20* 20* 21* 21*  GLUCOSE  139* 167* 157* 104*  BUN 44* 51* 53* 53*  CALCIUM 7.9* 8.0* 7.9* 7.8*  CREATININE 2.92* 3.61* 3.77* 3.59*  GFRNONAA 23* 18* 17* 18*    LIVER FUNCTION TESTS: Recent Labs    11/10/22 0855 11/11/22 0550 11/12/22 0819 11/13/22 0337  BILITOT 2.1* 2.0* 2.6* 3.1*  AST 93* 112* 110* 97*  ALT 68* 86* 102* 96*  ALKPHOS 348* 387* 399* 341*  PROT 4.7* 4.8* 5.0* 5.0*  ALBUMIN <1.5* <1.5* <1.5* <1.5*    Assessment and Plan: Biliary obstruction in the setting of metastatic cholangiocarcinoma  IR has been following peripherally for the potential need for PTC/D.  CT Abdomen Pelvis obtained yesterday and reviewed by Dr. Elby ShowersSuttle who notes mild ductal dilation on the CT.  Clinically he remains stable without signs of cholangitis.  WBC stable at 10.0.  Afebrile.   Long discussion with patient and his wife (as well as daughter) held this afternoon.  They were updated on how his case has been approached this week as it related to IR as well the idea of an outpatient appointment for anticipated biliary drain placement next week. After discussion, patient and family would like to remain inpatient. They are aware that if they stay, our approach remains the same-- watchful waiting until proceeding with percutaneous drain placement becomes best treatment. IR will continue to monitor through the weekend and into next week for lab work or changes in clinical status.  GI updated on patient's current decision.   Electronically Signed: Hoyt KochKacie Sue-Ellen Ximena Todaro, PA 11/13/2022, 4:16 PM   I spent a total of 15 Minutes at the the patient's bedside AND on the patient's hospital floor or unit, greater than 50% of which was counseling/coordinating care for cholangiocarinoma.

## 2022-11-13 NOTE — Progress Notes (Signed)
Daily Progress Note  DOA: 11/07/2022 Hospital Day: 7 Chief Complaint: Recurrent biliary obstruction   ASSESSMENT   1)unresectable cholangiocarcinoma s/p portal vein embolization in 06/2021 with recurrent biliary obstruction and cholangitis s/p stent placement in 05/2021 and 10/2022  -CT 3/30 showed increased ascites and intrahepatic biliary dilation and no pneumobilia raising concern for stent dysfunction -ERCP 4/1 showed partial occlusion of plastic biliary stent that went from CBD to left main hepatic duct, removed.  Previously placed uncovered metal stent was patent.  Biliary tree swept of sludge. -Repeat CT 4/4 shows similar intrahepatic bile duct dilation with metal biliary stent in place, moderate volume ascites and diffuse body wall edema -Total bilirubin continues to increase today.  Discussed this case with Dr. Joanne Gavel from IR who recommended doing to trend LFTs for now.  Per IR, delaying his drain placement by a few days may allow for improved visualization of the dilated bile ducts.  IR is willing to offer a percutaneous biliary drain placement early next week.    PLAN   -Continue to trend LFTs -Plan for ultrasound guided paracentesis today -Will place on a low-sodium diet to try to prevent ascites accumulation -Plan for percutaneous biliary drain placement with IR early next week -Antibiotics per ID -Will give a dose of albumin today to see if this helps with his kidney function -Nephrology following for management of AKI  Subjective / New events:  Patient denies ab pain or N&V.  He has been eating some.   Lab Results: Recent Labs    11/11/22 0550 11/12/22 0819 11/13/22 0337  WBC 12.0* 10.0 10.6*  HGB 8.7* 9.3* 8.8*  HCT 24.9* 27.5* 25.3*  PLT 74* 80* 75*   BMET Recent Labs    11/11/22 0550 11/12/22 0819 11/13/22 0337  NA 132* 133* 131*  K 4.1 4.1 4.1  CL 101 103 103  CO2 20* 21* 21*  GLUCOSE 167* 157* 104*  BUN 51* 53* 53*  CREATININE 3.61* 3.77*  3.59*  CALCIUM 8.0* 7.9* 7.8*   LFT Recent Labs    11/13/22 0337  PROT 5.0*  ALBUMIN <1.5*  AST 97*  ALT 96*  ALKPHOS 341*  BILITOT 3.1*   PT/INR No results for input(s): "LABPROT", "INR" in the last 72 hours.   Scheduled inpatient medications:   Chlorhexidine Gluconate Cloth  6 each Topical Daily   diclofenac  100 mg Rectal Once   feeding supplement  1 Container Oral TID BM   multivitamin with minerals  1 tablet Oral Daily   pantoprazole  40 mg Oral Q supper   sodium chloride flush  10-40 mL Intracatheter Q12H   Continuous inpatient infusions:   sodium chloride 100 mL/hr at 11/13/22 1315   ampicillin-sulbactam (UNASYN) IV 3 g (11/13/22 1318)   PRN inpatient medications: ondansetron **OR** ondansetron (ZOFRAN) IV  Vital signs in last 24 hours: Temp:  [97.6 F (36.4 C)-98.2 F (36.8 C)] 98.2 F (36.8 C) (04/05 1350) Pulse Rate:  [83-94] 83 (04/05 1350) Resp:  [15-17] 16 (04/05 0835) BP: (91-109)/(65-70) 108/69 (04/05 1350) SpO2:  [97 %-98 %] 98 % (04/05 1350) Last BM Date : 11/12/22  Intake/Output Summary (Last 24 hours) at 11/13/2022 1439 Last data filed at 11/13/2022 1100 Gross per 24 hour  Intake 2331.34 ml  Output 850 ml  Net 1481.34 ml    Intake/Output from previous day: 04/04 0701 - 04/05 0700 In: 2091.3 [P.O.:360; I.V.:1531.3; IV Piggyback:200] Out: 750 [Urine:750] Intake/Output this shift: Total I/O In: 480 [P.O.:480] Out: 400 [  Urine:400]   Physical Exam:  General: Alert male in NAD Heart:  Regular rate and rhythm.  Pulmonary: Normal respiratory effort Abdomen: Soft, distended, nontender. Normal bowel sounds. Neurologic: Alert and oriented Psych: Pleasant. Cooperative. Insight appears normal.    Principal Problem:   Metastatic cholangiocarcinoma to bile duct Active Problems:   Mixed hyperlipidemia   Transaminitis   Leukocytosis   Hyponatremia   GERD (gastroesophageal reflux disease)   Hypoalbuminemia due to protein-calorie  malnutrition   Sepsis   Hyperbilirubinemia   Dehydration   Nausea and vomiting   Ischemic cardiomyopathy   Acute kidney injury superimposed on chronic kidney disease   Biliary obstruction   Lactic acidosis   Prediabetes   Malignant ascites   Abdominal distention   Gram-negative bacteremia   Cholangitis   Bile duct stricture     LOS: 6 days   Imogene Burn, MD 11/13/2022, 2:39 PM

## 2022-11-13 NOTE — Care Management Important Message (Signed)
Important Message  Patient Details  Name: Jonathan Keith MRN: [id number] Date of Birth: [birth year]   Medicare Important Message Given:  Yes     Sherilyn Banker 11/13/2022, 12:08 PM

## 2022-11-13 NOTE — Progress Notes (Signed)
[facility] Progress Note   67 y.o. 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 Patient also had a paracentesis 11/08/2022 with removal of 1.2L clear yellow fluid with negative for Gram stain and culture as well.  Fever present with hypotension and E. Faecalis bacteremia.   Assessment/ Plan:   Acute renal failure with creatinine in the 1-1.2 range in 2023, as late as 07/08/22 and since then has been slowly rising to 1.3-1.44 09/2022 and then 1.3-1.6 in 11/02/2022. Since then creatinine was has steadily risen since hospitalization with a presenting creatinine that initially improved from 2.9 -> 2.28 (4/1). Patient's SBP has bee in the 80-100's range.  Patient is neg 1713mL during this hospitalization. Patient likely in ATN from hypotension and infection with Enterococcus faecalis bacteremia + contrast which can enter the venous system through trauma or biliary circulation.  TB peaked at 3.3 and certainly possible the patient may also have bile acid nephropathy but TB has decreased since peaking at 3.3.   H/O bladder stone and has an appt to see urology next week on 4/11. - At this time no indication for dialysis  and will follow closely with you. Renal function may be plateauing and hopefully will start to improve in next few days. From renal standpoint we can follow up in a week with labs. Will continue to follow while he's in the hospital as well as get him a f/u appt in [place] with Dr. Gaye Pollackola in a week with labs. - I updated the pt and  spouse who was bedside; no absolute indication for dialysis at this time. Hopefully his function will start improving tomorrow.   -Maintain MAP>65 for optimal renal perfusion.  - Avoid nephrotoxic medications including NSAIDs and iodinated intravenous contrast exposure unless the latter is absolutely indicated.   - Preferred narcotic agents for pain control are hydromorphone, fentanyl, and methadone. Morphine should not be used.  - Avoid Baclofen and avoid oral sodium phosphate and magnesium citrate based laxatives / bowel preps.  - Continue strict Input and Output monitoring. Will monitor the patient closely with you and intervene or adjust therapy as indicated by changes in clinical status/labs   Unresectable cholangiocarcinoma s/p portal vein embolization 08/2020 and subsequent stent placement in 20/2022 and 10/2022.  Last chemotherapy was in late 2023.  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  Subjective:   Feeling better but appetite still poor. Denies f/c/n/v/sob.   Objective:   BP 91/65 (BP Location: Left Arm)   Pulse 94   Temp 97.7 F (36.5 C)   Resp 15   Ht 5\' 11"  (1.803 m)   Wt 75.8 kg   SpO2 97%   BMI 23.31 kg/m   Intake/Output Summary (Last 24 hours) at 11/13/2022 0742 Last data filed at 11/13/2022 0300 Gross per 24 hour  Intake 2091.34 ml  Output 750 ml  Net 1341.34 ml   Weight change:   Physical Exam: GEN: NAD, A&Ox3, NCAT HEENT: No conjunctival pallor, EOMI NECK: Supple, no thyromegaly LUNGS: CTA B/L no  rales, rhonchi or wheezing CV: RRR, No M/R/G ABD: SNT +BS, distended but no rebound EXT: No lower extremity edema GU: Foley in place   Imaging: CT ABDOMEN PELVIS WO CONTRAST  Result Date: 11/12/2022 CLINICAL DATA:  History of unresectable cholangiocarcinoma status post portal vein embolization 06/2021 and stent placement on 05/2021 and 10/2022, most recently status post ERCP  11/09/2022 with elevated liver function tests EXAM: CT ABDOMEN AND PELVIS WITHOUT CONTRAST TECHNIQUE: Multidetector CT imaging of the abdomen and pelvis was performed following the standard protocol without IV contrast. RADIATION DOSE REDUCTION: This exam was performed according to the departmental dose-optimization program which includes automated exposure control, adjustment of the mA and/or kV according to patient size and/or use of iterative reconstruction technique. COMPARISON:  CT abdomen pelvis dated 11/07/2022 FINDINGS: Lower chest: Unchanged subpleural 3 mm right middle lobe nodule (5:3). New small right pleural effusion. Partially imaged heart size is normal. Coronary artery calcifications. Hepatobiliary: Prior embolization of known intrahepatic cholangiocarcinoma. No new focal hypoattenuating lesions. Similar intrahepatic bile duct dilation with metallic biliary stent in-situ in unchanged position. Interval removal of plastic stent. Normal gallbladder. Pancreas: No focal lesions or main ductal dilation. Spleen: Normal in size without focal abnormality. Adrenals/Urinary Tract: No adrenal nodules. No suspicious renal mass, calculi or hydronephrosis. Mildly distended urinary bladder with catheter in intraluminal gas. Mobile calcified stones within the bladder. Stomach/Bowel: Normal appearance of the stomach. No evidence of bowel wall thickening, distention, or inflammatory changes. Normal appendix. Vascular/Lymphatic: Aortic atherosclerosis. No enlarged abdominal or pelvic lymph nodes. Reproductive: Prostate is unremarkable. Other: Increased moderate volume ascites. No free air. Previously noted omental and mesenteric nodularity is less conspicuous. Musculoskeletal: No acute or abnormal lytic or blastic osseous lesions. Multilevel degenerative changes of the partially imaged thoracic and lumbar spine. Increased diffuse body wall edema. IMPRESSION: 1. Prior embolization of known intrahepatic  cholangiocarcinoma with similar intrahepatic bile duct dilation with metallic biliary stent in-situ in unchanged position. Interval removal of plastic stent. 2. Increased moderate volume ascites and diffuse body wall edema. 3. New small right pleural effusion. 4. Decreased conspicuity of previously noted omental and mesenteric nodularity. Attention on follow-up. 5.  Aortic Atherosclerosis (ICD10-I70.0). Electronically Signed   By: Agustin Cree M.D.   On: 11/12/2022 12:57    Labs: BMET Recent Labs  Lab 11/07/22 1213 11/08/22 0357 11/09/22 0343 11/10/22 0855 11/11/22 0550 11/12/22 0819 11/13/22 0337  NA 124*  128* 127* 129* 131* 132* 133* 131*  K 4.5  4.6 3.8 3.6 4.2 4.1 4.1 4.1  CL 96*  96* 97* 99 102 101 103 103  CO2 20* 19* 21* 20* 20* 21* 21*  GLUCOSE 161*  155* 98 153* 139* 167* 157* 104*  BUN 37*  32* 36* 34* 44* 51* 53* 53*  CREATININE 2.51*  2.90* 2.44* 2.28* 2.92* 3.61* 3.77* 3.59*  CALCIUM 7.9* 7.7* 7.7* 7.9* 8.0* 7.9* 7.8*  PHOS  --  4.0  --   --   --   --   --    CBC Recent Labs  Lab 11/07/22 1213 11/08/22 0357 11/10/22 0855 11/11/22 0550 11/12/22 0819 11/13/22 0337  WBC 24.3*   < > 8.3 12.0* 10.0 10.6*  NEUTROABS 21.6*  --   --   --  7.2  --   HGB 9.3*  9.2*   < > 8.3* 8.7* 9.3* 8.8*  HCT 27.2*  27.0*   < > 23.5* 24.9* 27.5* 25.3*  MCV 98.2   < > 95.1 95.8 97.5 96.2  PLT 92*   < >  67* 74* 80* 75*   < > = values in this interval not displayed.    Medications:     Chlorhexidine Gluconate Cloth  6 each Topical Daily   diclofenac  100 mg Rectal Once   feeding supplement  237 mL Oral TID BM   multivitamin with minerals  1 tablet Oral Daily   pantoprazole  40 mg Oral Q supper   sodium chloride flush  10-40 mL Intracatheter Q12H      Paulene Floor, MD 11/13/2022, 7:42 AM

## 2022-11-13 NOTE — Progress Notes (Signed)
PROGRESS NOTE    Jonathan Keith  JGO:[id number] DOB: 04-20-1956 DOA: 11/07/2022 PCP: Elfredia Nevins, MD   Brief Narrative: Jonathan Keith is a 67 y.o. male with a history of metastatic cholangiocarcinoma with biliary obstruction s/p stent placement, hyperlipidemia, CAD, ischemic cardiomyopathy, CHF, CKD stage IV, anemia of chronic disease presented hospital with fever and weakness and was noted to have sepsis secondary to biliary obstruction. Workup revealed enterococcal bacteremia.  GI and ID on board.  ERCP was done for removal of plastic biliary stent by GI 11/09/2022. Plan for percutaneous biliary drain as per IR pending.  Assessment and Plan:  Sepsis likely secondary to biliary obstruction and cholangitis. Currently on IV antibiotic with Unasyn.  Had undergone paracentesis on 11/08/2022 with removal of 1.2 L of clear yellow fluid.  Asctic fluid Gram stain, culture negative.  Absolute PMN count  not upto the criteria for SBP. Patient with known biliary obstruction secondary to known cholangiocarcinoma with recent biliary stent placement. Concern for biliary stent malfunction. GI was consulted and performed ERCP on 4/1, patient underwent removal of plastic stent with biliary tree sweep and removal of sludge. IR consulted for  percutaneous bile duct drain placement while getting antibiotics.  GI and IR following with daily LFT monitoring.  Enterococcus faecalis bacteremia ID was consulted and patient is on Unasyn. Repeat blood cultures (4/1) with no growth to date.  Will communicate with ID regarding plan for antibiotic choice and duration on discharge.  Malignant ascites/Metastatic cholangiocarcinoma On Unasyn.   Status post paracentesis on 11/08/2022 with removal of 1.2 L of fluid.  Patient continues to have ongoing ascites.  Nausea/vomiting Improved.  Continue Zofran  AKI on CKD stage IV Baseline creatinine of 1.4-1.6. Creatinine of 2.90 on admission.  Creatinine today at 3.5 from 3.7   Continue to monitor closely.  Strict intake and output charting, Daily weights.  Fena suggestive of possible prerenal etiology.  Received 1 L of IV fluid yesterday.  Overall positive balance for 800 mL.  Closely monitor renal function.  Nephrology has seen the patient and recommend outpatient follow-up with nephrology at [facility] with Dr. Ardelle Lesches in a week with labs.  Renal function has stabilized as per nephrology.  Acute on chronic hyponatremia Sodium was as low at 124 on presentation.  Currently at 131.  CAD with history of Ischemic cardiomyopathy Borderline low blood pressures.  Coreg and lisinopril on hold.  2D echocardiogram on 11/10/2022 showed LV ejection fraction of 40 to 45%.  Not on diuretic at this time.  Prediabetes Latest Hemoglobin A1C of 5.8%.  GERD Continue Protonix  Increased nutrient needs Noted by dietitian. No diagnosis of malnutrition.  Continue Ensure and multivitamins.  DVT prophylaxis: SCDs  Code Status:   Code Status: Full Code  Family Communication:   I spoke with the patient's wife at bedside.  Disposition Plan:  Home likely in 1 to 2 days.  Follow ID recommendations on antibiotic, IR GI recommendations on drain tube.  Consultants:  [facility] gastro[facility]ology Infectious disease Interventional radiology Nephrology.  Procedures:  4/1: ERCP  Antimicrobials: Unasyn IV.  Renally dosed   Subjective: Today, patient was seen and examined at bedside.  Patient states that he wishes to go home.  Patient's family at bedside.  Denies any nausea vomiting fever chills or rigor.    Objective: BP 109/69 (BP Location: Left Arm)   Pulse 89   Temp 98.1 F (36.7 C) Comment: Retook temperature after breakfast.  Resp 16   Ht 5\' 11"  (1.803 m)  Wt 75.8 kg   SpO2 98%   BMI 23.31 kg/m   Physical examination:  General:  Average built, not in obvious distress, appears chronically ill, deconditioned, HENT:   No scleral pallor or icterus noted. Oral mucosa is  moist.  Chest:  Clear breath sounds.  No crackles or wheezes.  CVS: S1 &S2 heard. No murmur.  Regular rate and rhythm. Abdomen: Soft, distended with ascites but nontender.  Bowel sounds are heard.  Foley catheter in place.   Extremities: No cyanosis, clubbing or edema.  Peripheral pulses are palpable. Psych: Alert, awake and Communicative, flat affect. CNS:  No cranial nerve deficits.  Moves all extremities. Skin: Warm and dry.  No rashes noted.  Data Reviewed: I have reviewed the following labs and imaging studies.     CBC Lab Results  Component Value Date   WBC 10.6 (H) 11/13/2022   RBC 2.63 (L) 11/13/2022   HGB 8.8 (L) 11/13/2022   HCT 25.3 (L) 11/13/2022   MCV 96.2 11/13/2022   MCH 33.5 11/13/2022   PLT 75 (L) 11/13/2022   MCHC 34.8 11/13/2022   RDW 16.9 (H) 11/13/2022   LYMPHSABS 1.1 11/12/2022   MONOABS 1.2 (H) 11/12/2022   EOSABS 0.3 11/12/2022   BASOSABS 0.0 11/12/2022     Last metabolic panel Lab Results  Component Value Date   NA 131 (L) 11/13/2022   K 4.1 11/13/2022   CL 103 11/13/2022   CO2 21 (L) 11/13/2022   BUN 53 (H) 11/13/2022   CREATININE 3.59 (H) 11/13/2022   GLUCOSE 104 (H) 11/13/2022   GFRNONAA 18 (L) 11/13/2022   CALCIUM 7.8 (L) 11/13/2022   PHOS 4.0 11/08/2022   PROT 5.0 (L) 11/13/2022   ALBUMIN <1.5 (L) 11/13/2022   LABGLOB 3.1 06/16/2021   AGRATIO 1.2 06/16/2021   BILITOT 3.1 (H) 11/13/2022   ALKPHOS 341 (H) 11/13/2022   AST 97 (H) 11/13/2022   ALT 96 (H) 11/13/2022   ANIONGAP 7 11/13/2022    GFR: Estimated Creatinine Clearance: 21.6 mL/min (A) (by C-G formula based on SCr of 3.59 mg/dL (H)).  Recent Results (from the past 240 hour(s))  Blood Culture (routine x 2)     Status: Abnormal   Collection Time: 11/07/22 12:13 PM   Specimen: Left Antecubital; Blood  Result Value Ref Range Status   Specimen Description   Final    LEFT ANTECUBITAL BOTTLES DRAWN AEROBIC AND ANAEROBIC Performed at [facility], [address]  [place], [place] [address]    Special Requests   Final    Blood Culture adequate volume Performed at [facility], [address] [place], [place] [address]    Culture  Setup Time   Final    GRAM POSITIVE COCCI Gram Stain Report Called to,Read Back By and Verified With: LOPEZ,V @ 0504 ON 11/08/22 BY JUW AEROBIC BOTTLE ONLY GS DONE @ [facility] CRITICAL RESULT CALLED TO, READ BACK BY AND VERIFIED WITH: PHARMD CATHY PIERCE [phone] AT 0846 BY EC  ANAB REVIEWED BY A. LAFRANCE Performed at [facility], [address] [place], [place] [address]    Culture ENTEROCOCCUS FAECALIS (A)  Final   Report Status 11/10/2022 FINAL  Final   Organism ID, Bacteria ENTEROCOCCUS FAECALIS  Final      Susceptibility   Enterococcus faecalis - MIC*    AMPICILLIN <=2 SENSITIVE Sensitive     VANCOMYCIN 2 SENSITIVE Sensitive     GENTAMICIN SYNERGY SENSITIVE Sensitive     * ENTEROCOCCUS FAECALIS  Blood Culture (routine  x 2)     Status: Abnormal   Collection Time: 11/07/22 12:13 PM   Specimen: BLOOD LEFT HAND  Result Value Ref Range Status   Specimen Description   Final    BLOOD LEFT HAND BOTTLES DRAWN AEROBIC AND ANAEROBIC Performed at [facility], [address] [place], [place] [address]    Special Requests   Final    Blood Culture adequate volume Performed at [facility], [address] [place], [place] [address]    Culture  Setup Time   Final    GRAM POSITIVE COCCI Gram Stain Report Called to,Read Back By and Verified With: B.BARNES @ 0841 BY STEPHTR 11/08/22  REVIEWED BY A. LAFRANCE CRITICAL VALUE NOTED.  VALUE IS CONSISTENT WITH PREVIOUSLY REPORTED AND CALLED VALUE. IN BOTH AEROBIC AND ANAEROBIC BOTTLES    Culture (A)  Final    ENTEROCOCCUS FAECALIS SUSCEPTIBILITIES PERFORMED ON PREVIOUS CULTURE WITHIN THE LAST 5 DAYS. Performed at [facility], [address] [place], [place] [address]    Report Status 11/10/2022 FINAL  Final  Blood Culture ID Panel (Reflexed)     Status: Abnormal    Collection Time: 11/07/22 12:13 PM  Result Value Ref Range Status   Enterococcus faecalis DETECTED (A) NOT DETECTED Final    Comment: CRITICAL RESULT CALLED TO, READ BACK BY AND VERIFIED WITH: PHARMD CATHY PIERCE [id number] AT 0846 BY EC    Enterococcus Faecium NOT DETECTED NOT DETECTED Final   Listeria monocytogenes NOT DETECTED NOT DETECTED Final   Staphylococcus species NOT DETECTED NOT DETECTED Final   Staphylococcus aureus (BCID) NOT DETECTED NOT DETECTED Final   Staphylococcus epidermidis NOT DETECTED NOT DETECTED Final   Staphylococcus lugdunensis NOT DETECTED NOT DETECTED Final   Streptococcus species NOT DETECTED NOT DETECTED Final   Streptococcus agalactiae NOT DETECTED NOT DETECTED Final   Streptococcus pneumoniae NOT DETECTED NOT DETECTED Final   Streptococcus pyogenes NOT DETECTED NOT DETECTED Final   A.calcoaceticus-baumannii NOT DETECTED NOT DETECTED Final   Bacteroides fragilis NOT DETECTED NOT DETECTED Final   Enterobacterales NOT DETECTED NOT DETECTED Final   Enterobacter cloacae complex NOT DETECTED NOT DETECTED Final   Escherichia coli NOT DETECTED NOT DETECTED Final   Klebsiella aerogenes NOT DETECTED NOT DETECTED Final   Klebsiella oxytoca NOT DETECTED NOT DETECTED Final   Klebsiella pneumoniae NOT DETECTED NOT DETECTED Final   Proteus species NOT DETECTED NOT DETECTED Final   Salmonella species NOT DETECTED NOT DETECTED Final   Serratia marcescens NOT DETECTED NOT DETECTED Final   Haemophilus influenzae NOT DETECTED NOT DETECTED Final   Neisseria meningitidis NOT DETECTED NOT DETECTED Final   Pseudomonas aeruginosa NOT DETECTED NOT DETECTED Final   Stenotrophomonas maltophilia NOT DETECTED NOT DETECTED Final   Candida albicans NOT DETECTED NOT DETECTED Final   Candida auris NOT DETECTED NOT DETECTED Final   Candida glabrata NOT DETECTED NOT DETECTED Final   Candida krusei NOT DETECTED NOT DETECTED Final   Candida parapsilosis NOT DETECTED NOT DETECTED Final    Candida tropicalis NOT DETECTED NOT DETECTED Final   Cryptococcus neoformans/gattii NOT DETECTED NOT DETECTED Final   Vancomycin resistance NOT DETECTED NOT DETECTED Final    Comment: Performed at [facility], [address] [place], [place] [address]  Resp panel by RT-PCR (RSV, Flu A&B, Covid) Anterior Nasal Swab     Status: None   Collection Time: 11/07/22  1:00 PM   Specimen: Anterior Nasal Swab  Result Value Ref Range Status   SARS Coronavirus 2 by RT  PCR NEGATIVE NEGATIVE Final    Comment: (NOTE) SARS-CoV-2 target nucleic acids are NOT DETECTED.  The SARS-CoV-2 RNA is generally detectable in upper respiratory specimens during the acute phase of infection. The lowest concentration of SARS-CoV-2 viral copies this assay can detect is 138 copies/mL. A negative result does not preclude SARS-Cov-2 infection and should not be used as the sole basis for treatment or other patient management decisions. A negative result may occur with  improper specimen collection/handling, submission of specimen other than nasopharyngeal swab, presence of viral mutation(s) within the areas targeted by this assay, and inadequate number of viral copies(<138 copies/mL). A negative result must be combined with clinical observations, patient history, and epidemiological information. The expected result is Negative.  Fact Sheet for Patients:  BloggerCourse.com  Fact Sheet for Healthcare Providers:  SeriousBroker.it  This test is no t yet approved or cleared by the [place] FDA and  has been authorized for detection and/or diagnosis of SARS-CoV-2 by FDA under an Emergency Use Authorization (EUA). This EUA will remain  in effect (meaning this test can be used) for the duration of the COVID-19 declaration under Section 564(b)(1) of the Act, 21 U.S.C.section 360bbb-3(b)(1), unless the authorization is terminated  or revoked sooner.        Influenza A by PCR NEGATIVE NEGATIVE Final   Influenza B by PCR NEGATIVE NEGATIVE Final    Comment: (NOTE) The Xpert Xpress SARS-CoV-2/FLU/RSV plus assay is intended as an aid in the diagnosis of influenza from Nasopharyngeal swab specimens and should not be used as a sole basis for treatment. Nasal washings and aspirates are unacceptable for Xpert Xpress SARS-CoV-2/FLU/RSV testing.  Fact Sheet for Patients: BloggerCourse.com  Fact Sheet for Healthcare Providers: SeriousBroker.it  This test is not yet approved or cleared by the [place] FDA and has been authorized for detection and/or diagnosis of SARS-CoV-2 by FDA under an Emergency Use Authorization (EUA). This EUA will remain in effect (meaning this test can be used) for the duration of the COVID-19 declaration under Section 564(b)(1) of the Act, 21 U.S.C. section 360bbb-3(b)(1), unless the authorization is terminated or revoked.     Resp Syncytial Virus by PCR NEGATIVE NEGATIVE Final    Comment: (NOTE) Fact Sheet for Patients: BloggerCourse.com  Fact Sheet for Healthcare Providers: SeriousBroker.it  This test is not yet approved or cleared by the [place] FDA and has been authorized for detection and/or diagnosis of SARS-CoV-2 by FDA under an Emergency Use Authorization (EUA). This EUA will remain in effect (meaning this test can be used) for the duration of the COVID-19 declaration under Section 564(b)(1) of the Act, 21 U.S.C. section 360bbb-3(b)(1), unless the authorization is terminated or revoked.  Performed at [facility], [address], [place], [place] [address]   Gram stain     Status: None   Collection Time: 11/08/22  1:34 PM   Specimen: PATH Cytology Peritoneal fluid  Result Value Ref Range Status   Specimen Description PERITONEAL  Final   Special Requests NONE  Final   Gram Stain   Final    RARE  WBC PRESENT, PREDOMINANTLY PMN NO ORGANISMS SEEN Performed at [facility], [address] [place], [place] [address]    Report Status 11/08/2022 FINAL  Final  Culture, body fluid w Gram Stain-bottle     Status: None   Collection Time: 11/08/22  1:34 PM   Specimen: Peritoneal Washings  Result Value Ref Range Status   Specimen Description PERITONEAL  Final   Special  Requests NONE  Final   Culture   Final    NO GROWTH 5 DAYS Performed at [facility], [address], [place], [place] [address]    Report Status 11/13/2022 FINAL  Final  Surgical pcr screen     Status: None   Collection Time: 11/09/22  7:09 AM   Specimen: Nasal Mucosa; Nasal Swab  Result Value Ref Range Status   MRSA, PCR NEGATIVE NEGATIVE Final   Staphylococcus aureus NEGATIVE NEGATIVE Final    Comment: (NOTE) The Xpert SA Assay (FDA approved for NASAL specimens in patients 47 years of age and older), is one component of a comprehensive surveillance program. It is not intended to diagnose infection nor to guide or monitor treatment. Performed at [facility], [address] [place], [place] [address]   Culture, blood (Routine X 2) w Reflex to ID Panel     Status: None (Preliminary result)   Collection Time: 11/09/22 10:06 AM   Specimen: BLOOD LEFT ARM  Result Value Ref Range Status   Specimen Description BLOOD LEFT ARM  Final   Special Requests   Final    BOTTLES DRAWN AEROBIC AND ANAEROBIC Blood Culture adequate volume   Culture   Final    NO GROWTH 4 DAYS Performed at [facility], [address], [place], [place] [address]    Report Status PENDING  Incomplete  Culture, blood (Routine X 2) w Reflex to ID Panel     Status: None (Preliminary result)   Collection Time: 11/09/22 10:10 AM   Specimen: BLOOD LEFT HAND  Result Value Ref Range Status   Specimen Description BLOOD LEFT HAND  Final   Special Requests   Final    BOTTLES DRAWN AEROBIC AND ANAEROBIC Blood Culture adequate  volume   Culture   Final    NO GROWTH 4 DAYS Performed at [facility], [address], [place], [place] [address]    Report Status PENDING  Incomplete      Radiology Studies: CT ABDOMEN PELVIS WO CONTRAST  Result Date: 11/12/2022 CLINICAL DATA:  History of unresectable cholangiocarcinoma status post portal vein embolization 06/2021 and stent placement on 05/2021 and 10/2022, most recently status post ERCP 11/09/2022 with elevated liver function tests EXAM: CT ABDOMEN AND PELVIS WITHOUT CONTRAST TECHNIQUE: Multidetector CT imaging of the abdomen and pelvis was performed following the standard protocol without IV contrast. RADIATION DOSE REDUCTION: This exam was performed according to the departmental dose-optimization program which includes automated exposure control, adjustment of the mA and/or kV according to patient size and/or use of iterative reconstruction technique. COMPARISON:  CT abdomen pelvis dated 11/07/2022 FINDINGS: Lower chest: Unchanged subpleural 3 mm right middle lobe nodule (5:3). New small right pleural effusion. Partially imaged heart size is normal. Coronary artery calcifications. Hepatobiliary: Prior embolization of known intrahepatic cholangiocarcinoma. No new focal hypoattenuating lesions. Similar intrahepatic bile duct dilation with metallic biliary stent in-situ in unchanged position. Interval removal of plastic stent. Normal gallbladder. Pancreas: No focal lesions or main ductal dilation. Spleen: Normal in size without focal abnormality. Adrenals/Urinary Tract: No adrenal nodules. No suspicious renal mass, calculi or hydronephrosis. Mildly distended urinary bladder with catheter in intraluminal gas. Mobile calcified stones within the bladder. Stomach/Bowel: Normal appearance of the stomach. No evidence of bowel wall thickening, distention, or inflammatory changes. Normal appendix. Vascular/Lymphatic: Aortic atherosclerosis. No enlarged abdominal or pelvic lymph nodes.  Reproductive: Prostate is unremarkable. Other: Increased moderate volume ascites. No free air. Previously noted omental and  mesenteric nodularity is less conspicuous. Musculoskeletal: No acute or abnormal lytic or blastic osseous lesions. Multilevel degenerative changes of the partially imaged thoracic and lumbar spine. Increased diffuse body wall edema. IMPRESSION: 1. Prior embolization of known intrahepatic cholangiocarcinoma with similar intrahepatic bile duct dilation with metallic biliary stent in-situ in unchanged position. Interval removal of plastic stent. 2. Increased moderate volume ascites and diffuse body wall edema. 3. New small right pleural effusion. 4. Decreased conspicuity of previously noted omental and mesenteric nodularity. Attention on follow-up. 5.  Aortic Atherosclerosis (ICD10-I70.0). Electronically Signed   By: Agustin Cree M.D.   On: 11/12/2022 12:57      LOS: 6 days    Joycelyn Das, MD [facility] 11/13/2022, 1:04 PM If 7PM-7AM, please contact night-coverage [email or web address]

## 2022-11-14 DIAGNOSIS — R188 Other ascites: Secondary | ICD-10-CM

## 2022-11-14 DIAGNOSIS — K831 Obstruction of bile duct: Secondary | ICD-10-CM | POA: Diagnosis not present

## 2022-11-14 DIAGNOSIS — C221 Intrahepatic bile duct carcinoma: Secondary | ICD-10-CM | POA: Diagnosis not present

## 2022-11-14 DIAGNOSIS — C7889 Secondary malignant neoplasm of other digestive organs: Secondary | ICD-10-CM | POA: Diagnosis not present

## 2022-11-14 DIAGNOSIS — R140 Abdominal distension (gaseous): Secondary | ICD-10-CM | POA: Diagnosis not present

## 2022-11-14 DIAGNOSIS — N179 Acute kidney failure, unspecified: Secondary | ICD-10-CM | POA: Diagnosis not present

## 2022-11-14 LAB — CBC
HCT: 21.9 % — ABNORMAL LOW (ref 39.0–52.0)
Hemoglobin: 7.4 g/dL — ABNORMAL LOW (ref 13.0–17.0)
MCH: 33 pg (ref 26.0–34.0)
MCHC: 33.8 g/dL (ref 30.0–36.0)
MCV: 97.8 fL (ref 80.0–100.0)
Platelets: 74 10*3/uL — ABNORMAL LOW (ref 150–400)
RBC: 2.24 MIL/uL — ABNORMAL LOW (ref 4.22–5.81)
RDW: 16.6 % — ABNORMAL HIGH (ref 11.5–15.5)
WBC: 7.2 10*3/uL (ref 4.0–10.5)
nRBC: 0 % (ref 0.0–0.2)

## 2022-11-14 LAB — COMPREHENSIVE METABOLIC PANEL
ALT: 63 U/L — ABNORMAL HIGH (ref 0–44)
AST: 61 U/L — ABNORMAL HIGH (ref 15–41)
Albumin: 2.2 g/dL — ABNORMAL LOW (ref 3.5–5.0)
Alkaline Phosphatase: 266 U/L — ABNORMAL HIGH (ref 38–126)
Anion gap: 8 (ref 5–15)
BUN: 52 mg/dL — ABNORMAL HIGH (ref 8–23)
CO2: 21 mmol/L — ABNORMAL LOW (ref 22–32)
Calcium: 8 mg/dL — ABNORMAL LOW (ref 8.9–10.3)
Chloride: 104 mmol/L (ref 98–111)
Creatinine, Ser: 3.22 mg/dL — ABNORMAL HIGH (ref 0.61–1.24)
GFR, Estimated: 20 mL/min — ABNORMAL LOW (ref >60)
Glucose, Bld: 102 mg/dL — ABNORMAL HIGH (ref 70–99)
Potassium: 3.9 mmol/L (ref 3.5–5.1)
Sodium: 133 mmol/L — ABNORMAL LOW (ref 135–145)
Total Bilirubin: 3.3 mg/dL — ABNORMAL HIGH (ref 0.3–1.2)
Total Protein: 5 g/dL — ABNORMAL LOW (ref 6.5–8.1)

## 2022-11-14 LAB — CULTURE, BLOOD (ROUTINE X 2)
Culture: NO GROWTH
Culture: NO GROWTH
Special Requests: ADEQUATE
Special Requests: ADEQUATE

## 2022-11-14 LAB — MAGNESIUM: Magnesium: 2.4 mg/dL (ref 1.7–2.4)

## 2022-11-14 MED ORDER — ALBUMIN HUMAN 25 % IV SOLN
50.0000 g | Freq: Once | INTRAVENOUS | Status: AC
  Administered 2022-11-14: 50 g via INTRAVENOUS
  Filled 2022-11-14: qty 200

## 2022-11-14 NOTE — Progress Notes (Signed)
PROGRESS NOTE    [name]  ZOX:[id number] DOB: 07-25-1956 DOA: 11/07/2022 PCP: Elfredia Nevins, MD   Brief Narrative: Jonathan Keith is a 67 y.o. male with a history of metastatic cholangiocarcinoma with biliary obstruction s/p stent placement, hyperlipidemia, CAD, ischemic cardiomyopathy, CHF, CKD stage IV, anemia of chronic disease presented hospital with fever and weakness and was noted to have sepsis secondary to biliary obstruction. Workup revealed enterococcal bacteremia.  GI and ID on board.  ERCP was done for removal of plastic biliary stent by GI 11/09/2022. Plan for percutaneous biliary drain as per IR pending.  Assessment and Plan:  Sepsis likely secondary to biliary obstruction and cholangitis. Currently on IV antibiotic with Unasyn.  Had undergone paracentesis on 11/08/2022 with removal of 1.2 L of clear yellow fluid.  Asctic fluid Gram stain, culture negative.  Absolute PMN count  not upto the criteria for SBP. Patient with known biliary obstruction secondary to known cholangiocarcinoma with recent biliary stent placement. Concern for biliary stent malfunction. GI was consulted and performed ERCP on 4/1, patient underwent removal of plastic stent with biliary tree sweep and removal of sludge. GI and IR following with daily LFT monitoring.  AST ALT trending down but the bilirubin trending up.IR consulted for  percutaneous bile duct drain placement while getting antibiotics.    Enterococcus faecalis bacteremia ID was consulted and patient is on Unasyn. Repeat blood cultures (4/1) with no growth to date.  Communicated with infectious disease on 11/13/2022.  Plan for amoxicillin 1 g 3 times daily on discharge.  Malignant ascites/Metastatic cholangiocarcinoma On Unasyn.   Status post paracentesis on 11/08/2022 with removal of 1.2 L of fluid.  Patient underwent repeat ultrasound-guided paracentesis on 11/13/2022 with removal of 2.5 L of yellow fluid.  Nausea/vomiting Improved.   Continue Zofran  AKI on CKD stage IV Baseline creatinine of 1.4-1.6. Creatinine of 2.90 on admission.  Creatinine today at 3.2 from 3.7, creatinine has been stable and slightly trended down.Strict intake and output charting, Daily weights.  Nephrology has seen the patient and recommend outpatient follow-up with nephrology at [facility] with Dr. Ardelle Lesches in a week with labs.  Renal function has stabilized as per nephrology.  Acute on chronic hyponatremia Sodium was as low at 124 on presentation.  Currently at 133.  CAD with history of Ischemic cardiomyopathy Borderline low blood pressures.  Coreg and lisinopril on hold.  2D echocardiogram on 11/10/2022 showed LV ejection fraction of 40 to 45%.  Not on diuretic at this time.  Prediabetes Latest Hemoglobin A1C of 5.8%.  GERD Continue Protonix  Increased nutrient needs Continue Ensure and multivitamins.  DVT prophylaxis: SCDs  Code Status:   Code Status: Full Code  Family Communication:   I again spoke with the patient's wife at bedside.  Disposition Plan:  Home likely in 2 to 3 days.  Plan for PTC/D if necessary as per IR.  Consultants:  [facility] Infectious disease Interventional radiology Nephrology.  Procedures:  4/1: ERCP  Antimicrobials: Unasyn IV.  Renally dosed   Subjective: Today, patient was seen and examined at bedside.  Patient's wife at bedside as well.  Patient denies any nausea vomiting fever chills or rigor.    Objective: BP 99/61 (BP Location: Left Arm)   Pulse 84   Temp 97.8 F (36.6 C) (Oral)   Resp 18   Ht  (1.803 m)   Wt 75.8 kg   SpO2 97%   BMI 23.31 kg/m   Physical examination:  General:  Average  built, not in obvious distress, appears chronically ill and deconditioned, HENT:   No scleral pallor or icterus noted. Oral mucosa is moist.  Chest:  Clear breath sounds.  Diminished breath sounds bilaterally. No crackles or wheezes.  CVS: S1 &S2 heard. No murmur.  Regular rate  and rhythm. Abdomen: Soft, distended abdomen, nontender,   Bowel sounds are heard.  Urethral catheter in place Extremities: No cyanosis, clubbing or edema.  Peripheral pulses are palpable. Psych: Alert, awake and oriented, flat affect CNS:  No cranial nerve deficits.  Moves all extremities. Skin: Warm and dry.  No rashes noted.   Data Reviewed: I have reviewed the following labs and imaging studies.     CBC Lab Results  Component Value Date   WBC 7.2 11/14/2022   RBC 2.24 (L) 11/14/2022   HGB 7.4 (L) 11/14/2022   HCT 21.9 (L) 11/14/2022   MCV 97.8 11/14/2022   MCH 33.0 11/14/2022   PLT 74 (L) 11/14/2022   MCHC 33.8 11/14/2022   RDW 16.6 (H) 11/14/2022   LYMPHSABS 1.1 11/12/2022   MONOABS 1.2 (H) 11/12/2022   EOSABS 0.3 11/12/2022   BASOSABS 0.0 11/12/2022     Last metabolic panel Lab Results  Component Value Date   NA 133 (L) 11/14/2022   K 3.9 11/14/2022   CL 104 11/14/2022   CO2 21 (L) 11/14/2022   BUN 52 (H) 11/14/2022   CREATININE 3.22 (H) 11/14/2022   GLUCOSE 102 (H) 11/14/2022   GFRNONAA 20 (L) 11/14/2022   CALCIUM 8.0 (L) 11/14/2022   PHOS 4.0 11/08/2022   PROT 5.0 (L) 11/14/2022   ALBUMIN 2.2 (L) 11/14/2022   LABGLOB 3.1 06/16/2021   AGRATIO 1.2 06/16/2021   BILITOT 3.3 (H) 11/14/2022   ALKPHOS 266 (H) 11/14/2022   AST 61 (H) 11/14/2022   ALT 63 (H) 11/14/2022   ANIONGAP 8 11/14/2022    GFR: Estimated Creatinine Clearance: 24 mL/min (A) (by C-G formula based on SCr of 3.22 mg/dL (H)).  Recent Results (from the past 240 hour(s))  Blood Culture (routine x 2)     Status: Abnormal   Collection Time: 11/07/22 12:13 PM   Specimen: Left Antecubital; Blood  Result Value Ref Range Status   Specimen Description   Final    LEFT ANTECUBITAL BOTTLES DRAWN AEROBIC AND ANAEROBIC Performed at [facility], [address] [place], [place] [address]    Special Requests   Final    Blood Culture adequate volume Performed at [facility], [address]  [place], [place] [address]    Culture  Setup Time   Final    GRAM POSITIVE COCCI Gram Stain Report Called to,Read Back By and Verified With: LOPEZ,V @ 0504 ON 11/08/22 BY JUW AEROBIC BOTTLE ONLY GS DONE @ [facility] CRITICAL RESULT CALLED TO, READ BACK BY AND VERIFIED WITH: PHARMD CATHY PIERCE [id number] AT 0846 BY EC  ANAB REVIEWED BY A. LAFRANCE Performed at [facility], [address] [place], [place] [address]    Culture ENTEROCOCCUS FAECALIS (A)  Final   Report Status 11/10/2022 FINAL  Final   Organism ID, Bacteria ENTEROCOCCUS FAECALIS  Final      Susceptibility   Enterococcus faecalis - MIC*    AMPICILLIN <=2 SENSITIVE Sensitive     VANCOMYCIN 2 SENSITIVE Sensitive     GENTAMICIN SYNERGY SENSITIVE Sensitive     * ENTEROCOCCUS FAECALIS  Blood Culture (routine x 2)     Status: Abnormal   Collection Time: 11/07/22 12:13 PM   Specimen:  BLOOD LEFT HAND  Result Value Ref Range Status   Specimen Description   Final    BLOOD LEFT HAND BOTTLES DRAWN AEROBIC AND ANAEROBIC Performed at [facility], [address], [place], [place] [address]    Special Requests   Final    Blood Culture adequate volume Performed at [facility], [address], [place], [place] [address]    Culture  Setup Time   Final    GRAM POSITIVE COCCI Gram Stain Report Called to,Read Back By and Verified With: B.BARNES @ 0841 BY STEPHTR 11/08/22  REVIEWED BY A. LAFRANCE CRITICAL VALUE NOTED.  VALUE IS CONSISTENT WITH PREVIOUSLY REPORTED AND CALLED VALUE. IN BOTH AEROBIC AND ANAEROBIC BOTTLES    Culture (A)  Final    ENTEROCOCCUS FAECALIS SUSCEPTIBILITIES PERFORMED ON PREVIOUS CULTURE WITHIN THE LAST 5 DAYS. Performed at [facility], [address] [place], [place] [address]    Report Status 11/10/2022 FINAL  Final  Blood Culture ID Panel (Reflexed)     Status: Abnormal   Collection Time: 11/07/22 12:13 PM  Result Value Ref Range Status   Enterococcus faecalis DETECTED (A) NOT DETECTED Final    Comment:  CRITICAL RESULT CALLED TO, READ BACK BY AND VERIFIED WITH: PHARMD CATHY PIERCE [id number] AT 0846 BY EC    Enterococcus Faecium NOT DETECTED NOT DETECTED Final   Listeria monocytogenes NOT DETECTED NOT DETECTED Final   Staphylococcus species NOT DETECTED NOT DETECTED Final   Staphylococcus aureus (BCID) NOT DETECTED NOT DETECTED Final   Staphylococcus epidermidis NOT DETECTED NOT DETECTED Final   Staphylococcus lugdunensis NOT DETECTED NOT DETECTED Final   Streptococcus species NOT DETECTED NOT DETECTED Final   Streptococcus agalactiae NOT DETECTED NOT DETECTED Final   Streptococcus pneumoniae NOT DETECTED NOT DETECTED Final   Streptococcus pyogenes NOT DETECTED NOT DETECTED Final   A.calcoaceticus-baumannii NOT DETECTED NOT DETECTED Final   Bacteroides fragilis NOT DETECTED NOT DETECTED Final   Enterobacterales NOT DETECTED NOT DETECTED Final   Enterobacter cloacae complex NOT DETECTED NOT DETECTED Final   Escherichia coli NOT DETECTED NOT DETECTED Final   Klebsiella aerogenes NOT DETECTED NOT DETECTED Final   Klebsiella oxytoca NOT DETECTED NOT DETECTED Final   Klebsiella pneumoniae NOT DETECTED NOT DETECTED Final   Proteus species NOT DETECTED NOT DETECTED Final   Salmonella species NOT DETECTED NOT DETECTED Final   Serratia marcescens NOT DETECTED NOT DETECTED Final   Haemophilus influenzae NOT DETECTED NOT DETECTED Final   Neisseria meningitidis NOT DETECTED NOT DETECTED Final   Pseudomonas aeruginosa NOT DETECTED NOT DETECTED Final   Stenotrophomonas maltophilia NOT DETECTED NOT DETECTED Final   Candida albicans NOT DETECTED NOT DETECTED Final   Candida auris NOT DETECTED NOT DETECTED Final   Candida glabrata NOT DETECTED NOT DETECTED Final   Candida krusei NOT DETECTED NOT DETECTED Final   Candida parapsilosis NOT DETECTED NOT DETECTED Final   Candida tropicalis NOT DETECTED NOT DETECTED Final   Cryptococcus neoformans/gattii NOT DETECTED NOT DETECTED Final   Vancomycin  resistance NOT DETECTED NOT DETECTED Final    Comment: Performed at [facility], [address] [place], [place] [address]  Resp panel by RT-PCR (RSV, Flu A&B, Covid) Anterior Nasal Swab     Status: None   Collection Time: 11/07/22  1:00 PM   Specimen: Anterior Nasal Swab  Result Value Ref Range Status   SARS Coronavirus 2 by RT PCR NEGATIVE NEGATIVE Final    Comment: (NOTE) SARS-CoV-2 target nucleic acids are NOT DETECTED.  The  SARS-CoV-2 RNA is generally detectable in upper respiratory specimens during the acute phase of infection. The lowest concentration of SARS-CoV-2 viral copies this assay can detect is 138 copies/mL. A negative result does not preclude SARS-Cov-2 infection and should not be used as the sole basis for treatment or other patient management decisions. A negative result may occur with  improper specimen collection/handling, submission of specimen other than nasopharyngeal swab, presence of viral mutation(s) within the areas targeted by this assay, and inadequate number of viral copies(<138 copies/mL). A negative result must be combined with clinical observations, patient history, and epidemiological information. The expected result is Negative.  Fact Sheet for Patients:  BloggerCourse.com  Fact Sheet for Healthcare Providers:  SeriousBroker.it  This test is no t yet approved or cleared by the [place] FDA and  has been authorized for detection and/or diagnosis of SARS-CoV-2 by FDA under an Emergency Use Authorization (EUA). This EUA will remain  in effect (meaning this test can be used) for the duration of the COVID-19 declaration under Section 564(b)(1) of the Act, 21 U.S.C.section 360bbb-3(b)(1), unless the authorization is terminated  or revoked sooner.       Influenza A by PCR NEGATIVE NEGATIVE Final   Influenza B by PCR NEGATIVE NEGATIVE Final    Comment: (NOTE) The Xpert Xpress  SARS-CoV-2/FLU/RSV plus assay is intended as an aid in the diagnosis of influenza from Nasopharyngeal swab specimens and should not be used as a sole basis for treatment. Nasal washings and aspirates are unacceptable for Xpert Xpress SARS-CoV-2/FLU/RSV testing.  Fact Sheet for Patients: BloggerCourse.com  Fact Sheet for Healthcare Providers: SeriousBroker.it  This test is not yet approved or cleared by the [place] FDA and has been authorized for detection and/or diagnosis of SARS-CoV-2 by FDA under an Emergency Use Authorization (EUA). This EUA will remain in effect (meaning this test can be used) for the duration of the COVID-19 declaration under Section 564(b)(1) of the Act, 21 U.S.C. section 360bbb-3(b)(1), unless the authorization is terminated or revoked.     Resp Syncytial Virus by PCR NEGATIVE NEGATIVE Final    Comment: (NOTE) Fact Sheet for Patients: BloggerCourse.com  Fact Sheet for Healthcare Providers: SeriousBroker.it  This test is not yet approved or cleared by the [place] FDA and has been authorized for detection and/or diagnosis of SARS-CoV-2 by FDA under an Emergency Use Authorization (EUA). This EUA will remain in effect (meaning this test can be used) for the duration of the COVID-19 declaration under Section 564(b)(1) of the Act, 21 U.S.C. section 360bbb-3(b)(1), unless the authorization is terminated or revoked.  Performed at [facility], [address], [place], [place] [address]   Gram stain     Status: None   Collection Time: 11/08/22  1:34 PM   Specimen: PATH Cytology Peritoneal fluid  Result Value Ref Range Status   Specimen Description PERITONEAL  Final   Special Requests NONE  Final   Gram Stain   Final    RARE WBC PRESENT, PREDOMINANTLY PMN NO ORGANISMS SEEN Performed at [facility], [address] [place], [place]  [address]    Report Status 11/08/2022 FINAL  Final  Culture, body fluid w Gram Stain-bottle     Status: None   Collection Time: 11/08/22  1:34 PM   Specimen: Peritoneal Washings  Result Value Ref Range Status   Specimen Description PERITONEAL  Final   Special Requests NONE  Final   Culture   Final    NO GROWTH 5 DAYS Performed  at [facility], [address] [place], [place] [address]    Report Status 11/13/2022 FINAL  Final  Surgical pcr screen     Status: None   Collection Time: 11/09/22  7:09 AM   Specimen: Nasal Mucosa; Nasal Swab  Result Value Ref Range Status   MRSA, PCR NEGATIVE NEGATIVE Final   Staphylococcus aureus NEGATIVE NEGATIVE Final    Comment: (NOTE) The Xpert SA Assay (FDA approved for NASAL specimens in patients 56 years of age and older), is one component of a comprehensive surveillance program. It is not intended to diagnose infection nor to guide or monitor treatment. Performed at [facility], [address], [place], [place] [address]   Culture, blood (Routine X 2) w Reflex to ID Panel     Status: None (Preliminary result)   Collection Time: 11/09/22 10:06 AM   Specimen: BLOOD LEFT ARM  Result Value Ref Range Status   Specimen Description BLOOD LEFT ARM  Final   Special Requests   Final    BOTTLES DRAWN AEROBIC AND ANAEROBIC Blood Culture adequate volume   Culture   Final    NO GROWTH 4 DAYS Performed at [facility], [address] [place], [place] [address]    Report Status PENDING  Incomplete  Culture, blood (Routine X 2) w Reflex to ID Panel     Status: None (Preliminary result)   Collection Time: 11/09/22 10:10 AM   Specimen: BLOOD LEFT HAND  Result Value Ref Range Status   Specimen Description BLOOD LEFT HAND  Final   Special Requests   Final    BOTTLES DRAWN AEROBIC AND ANAEROBIC Blood Culture adequate volume   Culture   Final    NO GROWTH 4 DAYS Performed at [facility], [address], [place], [place] [address]     Report Status PENDING  Incomplete      Radiology Studies: IR Paracentesis  Result Date: 11/14/2022 INDICATION: Patient with history of cholangiocarcinoma, recurrent ascites. Request is made for therapeutic paracentesis of up to 2.5 L maximum. EXAM: ULTRASOUND GUIDED THERAPEUTIC PARACENTESIS MEDICATIONS: 10 mL 1% lidocaine COMPLICATIONS: None immediate. PROCEDURE: Informed written consent was obtained from the patient after a discussion of the risks, benefits and alternatives to treatment. A timeout was performed prior to the initiation of the procedure. Initial ultrasound scanning demonstrates a moderate amount of ascites within the right lower abdominal quadrant. The right lower abdomen was prepped and draped in the usual sterile fashion. 1% lidocaine was used for local anesthesia. Following this, a 19 gauge, 7-cm, Yueh catheter was introduced. An ultrasound image was saved for documentation purposes. The paracentesis was performed. The catheter was removed and a dressing was applied. The patient tolerated the procedure well without immediate post procedural complication. FINDINGS: A total of approximately 2.5 liters of yellow fluid was removed. IMPRESSION: Successful ultrasound-guided paracentesis yielding 2.5 liters of peritoneal fluid. Read by: Loyce Dys PA-C Electronically Signed   By: Marliss Coots M.D.   On: 11/14/2022 09:47   CT ABDOMEN PELVIS WO CONTRAST  Result Date: 11/12/2022 CLINICAL DATA:  History of unresectable cholangiocarcinoma status post portal vein embolization 06/2021 and stent placement on 05/2021 and 10/2022, most recently status post ERCP 11/09/2022 with elevated liver function tests EXAM: CT ABDOMEN AND PELVIS WITHOUT CONTRAST TECHNIQUE: Multidetector CT imaging of the abdomen and pelvis was performed following the standard protocol without IV contrast. RADIATION DOSE REDUCTION: This exam was performed according to the departmental dose-optimization program which  includes  automated exposure control, adjustment of the mA and/or kV according to patient size and/or use of iterative reconstruction technique. COMPARISON:  CT abdomen pelvis dated 11/07/2022 FINDINGS: Lower chest: Unchanged subpleural 3 mm right middle lobe nodule (5:3). New small right pleural effusion. Partially imaged heart size is normal. Coronary artery calcifications. Hepatobiliary: Prior embolization of known intrahepatic cholangiocarcinoma. No new focal hypoattenuating lesions. Similar intrahepatic bile duct dilation with metallic biliary stent in-situ in unchanged position. Interval removal of plastic stent. Normal gallbladder. Pancreas: No focal lesions or main ductal dilation. Spleen: Normal in size without focal abnormality. Adrenals/Urinary Tract: No adrenal nodules. No suspicious renal mass, calculi or hydronephrosis. Mildly distended urinary bladder with catheter in intraluminal gas. Mobile calcified stones within the bladder. Stomach/Bowel: Normal appearance of the stomach. No evidence of bowel wall thickening, distention, or inflammatory changes. Normal appendix. Vascular/Lymphatic: Aortic atherosclerosis. No enlarged abdominal or pelvic lymph nodes. Reproductive: Prostate is unremarkable. Other: Increased moderate volume ascites. No free air. Previously noted omental and mesenteric nodularity is less conspicuous. Musculoskeletal: No acute or abnormal lytic or blastic osseous lesions. Multilevel degenerative changes of the partially imaged thoracic and lumbar spine. Increased diffuse body wall edema. IMPRESSION: 1. Prior embolization of known intrahepatic cholangiocarcinoma with similar intrahepatic bile duct dilation with metallic biliary stent in-situ in unchanged position. Interval removal of plastic stent. 2. Increased moderate volume ascites and diffuse body wall edema. 3. New small right pleural effusion. 4. Decreased conspicuity of previously noted omental and mesenteric nodularity. Attention on  follow-up. 5.  Aortic Atherosclerosis (ICD10-I70.0). Electronically Signed   By: Agustin Cree M.D.   On: 11/12/2022 12:57      LOS: 7 days    Joycelyn Das, MD [facility] 11/14/2022, 10:18 AM If 7PM-7AM, please contact night-coverage [email or web address]

## 2022-11-14 NOTE — Progress Notes (Signed)
Daily Progress Note  DOA: 11/07/2022 Hospital Day: 8 Chief Complaint: Recurrent biliary obstruction   ASSESSMENT   1)unresectable cholangiocarcinoma s/p portal vein embolization in 06/2021 with recurrent biliary obstruction and cholangitis s/p stent placement in 05/2021 and 10/2022  -CT 3/30 showed increased ascites and intrahepatic biliary dilation and no pneumobilia raising concern for stent dysfunction -ERCP 4/1 showed partial occlusion of plastic biliary stent that went from CBD to left main hepatic duct, removed.  Previously placed uncovered metal stent was patent.  Biliary tree swept of sludge. -Repeat CT 4/4 shows similar intrahepatic bile duct dilation with metal biliary stent in place, moderate volume ascites and diffuse body wall edema -Total bilirubin continues to increase today.  IR is willing to offer a percutaneous biliary drain placement early next week.    2) AKI, ascites -Patient has intermittently received LVPs for removal of ascites -Not able to start diuretics due to AKI -Creatinine appears to be improving on albumin therapy  PLAN   -Continue to trend LFTs -Low sodium diet <2 grams per day -Plan for percutaneous biliary drain placement with IR early next week -Antibiotics per ID -Will give another dose of albumin today -Nephrology following for management of AKI  Subjective / New events:  Patient feels better after getting 2.5 L of ascites fluid removed via paracentesis yesterday.   Lab Results: Recent Labs    11/12/22 0819 11/13/22 0337 11/14/22 0303  WBC 10.0 10.6* 7.2  HGB 9.3* 8.8* 7.4*  HCT 27.5* 25.3* 21.9*  PLT 80* 75* 74*   BMET Recent Labs    11/12/22 0819 11/13/22 0337 11/14/22 0303  NA 133* 131* 133*  K 4.1 4.1 3.9  CL 103 103 104  CO2 21* 21* 21*  GLUCOSE 157* 104* 102*  BUN 53* 53* 52*  CREATININE 3.77* 3.59* 3.22*  CALCIUM 7.9* 7.8* 8.0*   LFT Recent Labs    11/14/22 0303  PROT 5.0*  ALBUMIN 2.2*  AST 61*  ALT  63*  ALKPHOS 266*  BILITOT 3.3*   PT/INR No results for input(s): "LABPROT", "INR" in the last 72 hours.   Scheduled inpatient medications:   Chlorhexidine Gluconate Cloth  6 each Topical Daily   diclofenac  100 mg Rectal Once   feeding supplement  1 Container Oral TID BM   multivitamin with minerals  1 tablet Oral Daily   pantoprazole  40 mg Oral Q supper   sodium chloride flush  10-40 mL Intracatheter Q12H   Continuous inpatient infusions:   sodium chloride 100 mL/hr at 11/13/22 1315   ampicillin-sulbactam (UNASYN) IV 3 g (11/14/22 1302)   PRN inpatient medications: ondansetron **OR** ondansetron (ZOFRAN) IV, mouth rinse  Vital signs in last 24 hours: Temp:  [97.8 F (36.6 C)-98.1 F (36.7 C)] 97.8 F (36.6 C) (04/06 0406) Pulse Rate:  [81-84] 84 (04/06 0406) Resp:  [18] 18 (04/06 0406) BP: (99-123)/(61-66) 99/61 (04/06 0406) SpO2:  [97 %-98 %] 97 % (04/06 0406) Last BM Date : 11/12/22  Intake/Output Summary (Last 24 hours) at 11/14/2022 1453 Last data filed at 11/13/2022 2040 Gross per 24 hour  Intake 10 ml  Output --  Net 10 ml    Intake/Output from previous day: 04/05 0701 - 04/06 0700 In: 490 [P.O.:480; I.V.:10] Out: 800 [Urine:800] Intake/Output this shift: No intake/output data recorded.   Physical Exam:  General: Alert male in NAD Heart:  Regular rate and rhythm.  Pulmonary: Normal respiratory effort Abdomen: Soft, distended, nontender. Normal bowel sounds. Neurologic: Alert and  oriented Psych: Pleasant. Cooperative. Insight appears normal.    Principal Problem:   Metastatic cholangiocarcinoma to bile duct Active Problems:   Mixed hyperlipidemia   Transaminitis   Leukocytosis   Hyponatremia   GERD (gastroesophageal reflux disease)   Hypoalbuminemia due to protein-calorie malnutrition   Sepsis   Hyperbilirubinemia   Dehydration   Nausea and vomiting   Ischemic cardiomyopathy   Acute kidney injury superimposed on chronic kidney disease    Biliary obstruction   Lactic acidosis   Prediabetes   Malignant ascites   Abdominal distention   Gram-negative bacteremia   Cholangitis   Bile duct stricture     LOS: 7 days   Imogene Burn, MD 11/14/2022, 2:53 PM

## 2022-11-14 NOTE — Progress Notes (Signed)
[facility] Progress Note   67 y.o. 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 Patient also had a paracentesis 11/08/2022 with removal of 1.2L clear yellow fluid with negative for Gram stain and culture as well.  Fever present with hypotension and E. Faecalis bacteremia.   Assessment/ Plan:   Acute renal failure with creatinine in the 1-1.2 range in 2023, as late as 07/08/22 and since then has been slowly rising to 1.3-1.44 09/2022 and then 1.3-1.6 in 11/02/2022. Since then creatinine was has steadily risen since hospitalization with a presenting creatinine that initially improved from 2.9 -> 2.28 (4/1). Patient's SBP has bee in the 80-100's range.  Patient is neg 1713mL during this hospitalization. Patient likely in ATN from hypotension and infection with Enterococcus faecalis bacteremia + contrast which can enter the venous system through trauma or biliary circulation.  TB peaked at 3.3 and certainly possible the patient may also have bile acid nephropathy but TB has decreased since peaking at 3.3.   H/O bladder stone and has an appt to see urology next week on 4/11. - At this time no indication for dialysis  and will follow closely with you. Renal function appears to have plateaued and function improving. I've already contacted [facility] on 4/5 and they will reach out to the family for an appt in [place] in a week with labs with Dr. Gaye Pollackola. - I updated the pt and spouse who was bedside; no absolute indication for dialysis at this time.     -Maintain MAP>65 for optimal renal perfusion.  - Avoid nephrotoxic medications including NSAIDs and iodinated intravenous contrast exposure unless the latter is absolutely indicated.   - Preferred narcotic agents for pain control are hydromorphone, fentanyl, and methadone. Morphine should not be used.  - Avoid Baclofen and avoid oral sodium phosphate and magnesium citrate based laxatives / bowel preps.  - Continue strict Input and Output monitoring. Will monitor the patient closely with you and intervene or adjust therapy as indicated by changes in clinical status/labs   Unresectable cholangiocarcinoma s/p portal vein embolization 08/2020 and subsequent stent placement in 20/2022 and 10/2022.  Last chemotherapy was in late 2023.  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  Subjective:   Feeling better but appetite still poor. Denies f/c/n/v/sob. He really wanted to go home and had to be convinced to stay by IR, evaluating to see if perc drain placement becomes necessary..   Objective:   BP 99/61 (BP Location: Left Arm)   Pulse 84   Temp 97.8 F (36.6 C) (Oral)   Resp 18   Ht 5\' 11"  (1.803 m)   Wt 75.8 kg   SpO2 97%   BMI 23.31 kg/m   Intake/Output Summary (Last 24 hours) at 11/14/2022 1109 Last data filed at 11/13/2022 2040 Gross per 24 hour  Intake 10 ml  Output 400 ml  Net -390 ml   Weight change:   Physical Exam: GEN: NAD, A&Ox3, NCAT HEENT: No conjunctival pallor, EOMI NECK: Supple, no thyromegaly LUNGS: CTA B/L  no rales, rhonchi or wheezing CV: RRR, No M/R/G ABD: SNT +BS, distended but no rebound EXT: No lower extremity edema GU: Foley in place   Imaging: IR Paracentesis  Result Date: 11/14/2022 INDICATION: Patient with history of cholangiocarcinoma, recurrent ascites. Request is made for therapeutic paracentesis of up to 2.5 L maximum. EXAM: ULTRASOUND GUIDED THERAPEUTIC PARACENTESIS  MEDICATIONS: 10 mL 1% lidocaine COMPLICATIONS: None immediate. PROCEDURE: Informed written consent was obtained from the patient after a discussion of the risks, benefits and alternatives to treatment. A timeout was performed prior to the initiation of the procedure. Initial ultrasound scanning demonstrates a moderate amount of ascites within the right lower abdominal quadrant. The right lower abdomen was prepped and draped in the usual sterile fashion. 1% lidocaine was used for local anesthesia. Following this, a 19 gauge, 7-cm, Yueh catheter was introduced. An ultrasound image was saved for documentation purposes. The paracentesis was performed. The catheter was removed and a dressing was applied. The patient tolerated the procedure well without immediate post procedural complication. FINDINGS: A total of approximately 2.5 liters of yellow fluid was removed. IMPRESSION: Successful ultrasound-guided paracentesis yielding 2.5 liters of peritoneal fluid. Read by: Loyce Dys PA-C Electronically Signed   By: Marliss Coots M.D.   On: 11/14/2022 09:47   CT ABDOMEN PELVIS WO CONTRAST  Result Date: 11/12/2022 CLINICAL DATA:  History of unresectable cholangiocarcinoma status post portal vein embolization 06/2021 and stent placement on 05/2021 and 10/2022, most recently status post ERCP 11/09/2022 with elevated liver function tests EXAM: CT ABDOMEN AND PELVIS WITHOUT CONTRAST TECHNIQUE: Multidetector CT imaging of the abdomen and pelvis was performed following the standard protocol without IV contrast. RADIATION DOSE REDUCTION: This exam was performed according to the departmental dose-optimization program which includes automated exposure control, adjustment of the mA and/or kV according to patient size and/or use of iterative reconstruction technique. COMPARISON:  CT abdomen pelvis dated 11/07/2022 FINDINGS: Lower chest: Unchanged subpleural 3 mm right middle lobe nodule (5:3). New small right pleural effusion.  Partially imaged heart size is normal. Coronary artery calcifications. Hepatobiliary: Prior embolization of known intrahepatic cholangiocarcinoma. No new focal hypoattenuating lesions. Similar intrahepatic bile duct dilation with metallic biliary stent in-situ in unchanged position. Interval removal of plastic stent. Normal gallbladder. Pancreas: No focal lesions or main ductal dilation. Spleen: Normal in size without focal abnormality. Adrenals/Urinary Tract: No adrenal nodules. No suspicious renal mass, calculi or hydronephrosis. Mildly distended urinary bladder with catheter in intraluminal gas. Mobile calcified stones within the bladder. Stomach/Bowel: Normal appearance of the stomach. No evidence of bowel wall thickening, distention, or inflammatory changes. Normal appendix. Vascular/Lymphatic: Aortic atherosclerosis. No enlarged abdominal or pelvic lymph nodes. Reproductive: Prostate is unremarkable. Other: Increased moderate volume ascites. No free air. Previously noted omental and mesenteric nodularity is less conspicuous. Musculoskeletal: No acute or abnormal lytic or blastic osseous lesions. Multilevel degenerative changes of the partially imaged thoracic and lumbar spine. Increased diffuse body wall edema. IMPRESSION: 1. Prior embolization of known intrahepatic cholangiocarcinoma with similar intrahepatic bile duct dilation with metallic biliary stent in-situ in unchanged position. Interval removal of plastic stent. 2. Increased moderate volume ascites and diffuse body wall edema. 3. New small right pleural effusion. 4. Decreased conspicuity of previously noted omental and mesenteric nodularity. Attention on follow-up. 5.  Aortic Atherosclerosis (ICD10-I70.0). Electronically Signed   By: Agustin Cree M.D.   On: 11/12/2022 12:57    Labs: [employer] Recent [employer] 11/08/22 5189 11/09/22 8421 11/10/22 0312 11/11/22 0550 11/12/22 8118 11/13/22 8677 11/14/22 0303  NA 127* 129* 131* 132* 133* 131* 133*   K 3.8 3.6 4.2 4.1 4.1 4.1 3.9  CL 97* 99 102 101 103 103 104  CO2 19* 21* 20* 20* 21* 21* 21*  GLUCOSE 98 153* 139* 167* 157* 104* 102*  BUN 36* 34* 44* 51* 53* 53* 52*  CREATININE 2.44* 2.28* 2.92* 3.61* 3.77* 3.59* 3.22*  CALCIUM 7.7* 7.7* 7.9* 8.0* 7.9* 7.8* 8.0*  PHOS 4.0  --   --   --   --   --   --    CBC Recent Labs  Lab 11/07/22 1213 11/08/22 0357 11/11/22 0550 11/12/22 0819 11/13/22 0337 11/14/22 0303  WBC 24.3*   < > 12.0* 10.0 10.6* 7.2  NEUTROABS 21.6*  --   --  7.2  --   --   HGB 9.3*  9.2*   < > 8.7* 9.3* 8.8* 7.4*  HCT 27.2*  27.0*   < > 24.9* 27.5* 25.3* 21.9*  MCV 98.2   < > 95.8 97.5 96.2 97.8  PLT 92*   < > 74* 80* 75* 74*   < > = values in this interval not displayed.    Medications:     Chlorhexidine Gluconate Cloth  6 each Topical Daily   diclofenac  100 mg Rectal Once   feeding supplement  1 Container Oral TID BM   multivitamin with minerals  1 tablet Oral Daily   pantoprazole  40 mg Oral Q supper   sodium chloride flush  10-40 mL Intracatheter Q12H      Paulene Floor, MD 11/14/2022, 11:09 AM

## 2022-11-14 NOTE — Progress Notes (Signed)
Physical Therapy Treatment Patient Details Name: Jonathan Keith MRN: [id number] DOB: 09/21/1955 Today's Date: 11/14/2022   History of Present Illness 67 y.o. male presents to [facility] on [facility]07/2022 with 3 day onset of fever, abdominal pain and weakness. Pt admitted for management of sepsis 2.2 biliary obstruction. Pt underwent ERCP with removal of one stent on 4/1. PMH includes metastatic cholangiocarcinoma, HLD,CAD< CKD4, LV thrombosis, AOCD.    PT Comments    Pt has improved currently at supervision to mod I for mobility. Pt was able to increase gait distance this session. Pt family was present and very supportive throughout session. Due to pt current functional status, home set up and available assistance recommending physical therapy at a lower frequency at 3x/week on discharge to improve overall strength and endurance.  Short session due to pt fatigue and wanting to take a nap. Pt demonstrated no signs/symptoms of cardiac/respiratory distress throughout session.   Recommendations for follow up therapy are one component of a multi-disciplinary discharge planning process, led by the attending physician.  Recommendations may be updated based on patient status, additional functional criteria and insurance authorization.  Follow Up Recommendations       Assistance Recommended at Discharge PRN  Patient can return home with the following A little help with bathing/dressing/bathroom;Assistance with cooking/housework;Assist for transportation;Help with stairs or ramp for entrance   Equipment Recommendations  None recommended by PT (owns necessary DME)    Recommendations for Other Services       Precautions / Restrictions Precautions Precautions: Fall Restrictions Weight Bearing Restrictions: No     Mobility  Bed Mobility Overal bed mobility: Modified Independent             General bed mobility comments: increased time Patient Response: Cooperative  Transfers Overall  transfer level: Needs assistance Equipment used: Rolling walker (2 wheels) Transfers: Sit to/from Stand Sit to Stand: Supervision           General transfer comment: RW for stabilizationin standing.    Ambulation/Gait Ambulation/Gait assistance: Supervision Gait Distance (Feet): 250 Feet Assistive device: Rolling walker (2 wheels) Gait Pattern/deviations: Step-through pattern, Decreased stride length Gait velocity: Decreased cadence. Pt states he is fatigued. Gait velocity interpretation: <1.8 ft/sec, indicate of risk for recurrent falls   General Gait Details: Short stride length and movement.       Balance Overall balance assessment: Mild deficits observed, not formally tested Sitting-balance support: No upper extremity supported, Feet supported Sitting balance-Leahy Scale: Good     Standing balance support: Reliant on assistive device for balance, Bilateral upper extremity supported, No upper extremity supported Standing balance-Leahy Scale: Fair          Cognition Arousal/Alertness: Awake/alert Behavior During Therapy: WFL for tasks assessed/performed Overall Cognitive Status: Within Functional Limits for tasks assessed         General Comments General comments (skin integrity, edema, etc.): Pt was fatigued but agreed to participate in skilled physical therapy services.      Pertinent Vitals/Pain Pain Assessment Pain Assessment: No/denies pain     PT Goals (current goals can now be found in the care plan section) Acute Rehab PT Goals Patient Stated Goal: to return to independence PT Goal Formulation: With patient Time For Goal Achievement: 11/24/22 Potential to Achieve Goals: Good Additional Goals Additional Goal #1: Pt will score >19/24 on the DGI to indicate a reduced risk for falls Progress towards PT goals: Progressing toward goals    Frequency    Min 3X/week  PT Plan Current plan remains appropriate    AM-PAC PT "6 Clicks"  Mobility   Outcome Measure  Help needed turning from your back to your side while in a flat bed without using bedrails?: None Help needed moving from lying on your back to sitting on the side of a flat bed without using bedrails?: None Help needed moving to and from a bed to a chair (including a wheelchair)?: A Little Help needed standing up from a chair using your arms (e.g., wheelchair or bedside chair)?: A Little Help needed to walk in hospital room?: A Little Help needed climbing 3-5 steps with a railing? : A Little 6 Click Score: 20    End of Session Equipment Utilized During Treatment: Gait belt Activity Tolerance: Patient tolerated treatment well Patient left: in chair;with call bell/phone within reach;with chair alarm set;with family/visitor present Nurse Communication: Mobility status PT Visit Diagnosis: Other abnormalities of gait and mobility (R26.89);Muscle weakness (generalized) (M62.81)     Time: 2111-5520 PT Time Calculation (min) (ACUTE ONLY): 10 min  Charges:  $Therapeutic Activity: 8-22 mins                    Harrel Carina, DPT, CLT  Acute Rehabilitation Services Office: [phone] (Secure chat preferred)    Claudia Desanctis 11/14/2022, 3:30 PM

## 2022-11-14 NOTE — Procedures (Addendum)
PROCEDURE SUMMARY:  Successful US guided paracentesis from right lateral abdomen.  Yielded 2.5 liters of yellow fluid.  No immediate complications.  Pt tolerated well.   Specimen was not sent for labs.  EBL < 57mL  Hoyt Koch PA-C 11/14/2022 9:40 AM

## 2022-11-15 DIAGNOSIS — R188 Other ascites: Secondary | ICD-10-CM | POA: Diagnosis not present

## 2022-11-15 DIAGNOSIS — K831 Obstruction of bile duct: Secondary | ICD-10-CM | POA: Diagnosis not present

## 2022-11-15 DIAGNOSIS — N179 Acute kidney failure, unspecified: Secondary | ICD-10-CM | POA: Diagnosis not present

## 2022-11-15 DIAGNOSIS — C221 Intrahepatic bile duct carcinoma: Secondary | ICD-10-CM | POA: Diagnosis not present

## 2022-11-15 DIAGNOSIS — C7889 Secondary malignant neoplasm of other digestive organs: Secondary | ICD-10-CM | POA: Diagnosis not present

## 2022-11-15 DIAGNOSIS — R140 Abdominal distension (gaseous): Secondary | ICD-10-CM | POA: Diagnosis not present

## 2022-11-15 LAB — COMPREHENSIVE METABOLIC PANEL
ALT: 57 U/L — ABNORMAL HIGH (ref 0–44)
AST: 59 U/L — ABNORMAL HIGH (ref 15–41)
Albumin: 2.7 g/dL — ABNORMAL LOW (ref 3.5–5.0)
Alkaline Phosphatase: 255 U/L — ABNORMAL HIGH (ref 38–126)
Anion gap: 11 (ref 5–15)
BUN: 43 mg/dL — ABNORMAL HIGH (ref 8–23)
CO2: 21 mmol/L — ABNORMAL LOW (ref 22–32)
Calcium: 8.4 mg/dL — ABNORMAL LOW (ref 8.9–10.3)
Chloride: 103 mmol/L (ref 98–111)
Creatinine, Ser: 2.56 mg/dL — ABNORMAL HIGH (ref 0.61–1.24)
GFR, Estimated: 27 mL/min — ABNORMAL LOW (ref >60)
Glucose, Bld: 137 mg/dL — ABNORMAL HIGH (ref 70–99)
Potassium: 3.8 mmol/L (ref 3.5–5.1)
Sodium: 135 mmol/L (ref 135–145)
Total Bilirubin: 3 mg/dL — ABNORMAL HIGH (ref 0.3–1.2)
Total Protein: 5.3 g/dL — ABNORMAL LOW (ref 6.5–8.1)

## 2022-11-15 LAB — CBC
HCT: 22.2 % — ABNORMAL LOW (ref 39.0–52.0)
Hemoglobin: 7.5 g/dL — ABNORMAL LOW (ref 13.0–17.0)
MCH: 33.5 pg (ref 26.0–34.0)
MCHC: 33.8 g/dL (ref 30.0–36.0)
MCV: 99.1 fL (ref 80.0–100.0)
Platelets: 81 10*3/uL — ABNORMAL LOW (ref 150–400)
RBC: 2.24 MIL/uL — ABNORMAL LOW (ref 4.22–5.81)
RDW: 16.7 % — ABNORMAL HIGH (ref 11.5–15.5)
WBC: 6.3 10*3/uL (ref 4.0–10.5)
nRBC: 0 % (ref 0.0–0.2)

## 2022-11-15 MED ORDER — ALBUMIN HUMAN 25 % IV SOLN
50.0000 g | Freq: Once | INTRAVENOUS | Status: AC
  Administered 2022-11-15: 50 g via INTRAVENOUS
  Filled 2022-11-15: qty 200

## 2022-11-15 MED ORDER — LIDOCAINE 5 % EX PTCH: 1.0000 | MEDICATED_PATCH | Freq: Every day | CUTANEOUS | Status: DC

## 2022-11-15 MED ORDER — LIDOCAINE 5 % EX PTCH
1.0000 | MEDICATED_PATCH | Freq: Every day | CUTANEOUS | Status: DC
  Administered 2022-11-16: 1 via TRANSDERMAL
  Filled 2022-11-15: qty 1

## 2022-11-15 MED ORDER — ACETAMINOPHEN 325 MG PO TABS: 650.0000 mg | ORAL_TABLET | ORAL | Status: AC | PRN

## 2022-11-15 MED ORDER — LIDOCAINE 5 % EX PTCH
1.0000 | MEDICATED_PATCH | Freq: Every day | CUTANEOUS | Status: DC
  Administered 2022-11-15: 1 via TRANSDERMAL
  Filled 2022-11-15 (×2): qty 1

## 2022-11-15 NOTE — Progress Notes (Signed)
Mobility Specialist Progress Note    11/15/22 1230  Mobility  Activity Ambulated with assistance in hallway  Level of Assistance Standby assist, set-up cues, supervision of patient - no hands on  Assistive Device Front wheel walker  Distance Ambulated (ft) 150 ft  Activity Response Tolerated well  Mobility Referral Yes  $Mobility charge 1 Mobility   Pre-Mobility: 75 HR During Mobility: 113 HR Post-Mobility: 86 HR  Pt received in bed and agreeable. No complaints on walk. Returned to bed with call bell in reach.   [place] Mobility Specialist  Please Neurosurgeon or Rehab Office at [phone]

## 2022-11-15 NOTE — Progress Notes (Signed)
Daily Progress Note  DOA: 11/07/2022 Hospital Day: 9 Chief Complaint: Recurrent biliary obstruction  ASSESSMENT   1)unresectable cholangiocarcinoma s/p portal vein embolization in 06/2021 with recurrent biliary obstruction and cholangitis s/p stent placement in 05/2021 and 10/2022  -CT 3/30 showed increased ascites and intrahepatic biliary dilation and no pneumobilia raising concern for stent dysfunction -ERCP 4/1 showed partial occlusion of plastic biliary stent that went from CBD to left main hepatic duct, removed.  Previously placed uncovered metal stent was patent.  Biliary tree swept of sludge. -Repeat CT 4/4 shows similar intrahepatic bile duct dilation with metal biliary stent in place, moderate volume ascites and diffuse body wall edema -LFTs have decreased slightly today.  Depending on what the LFTs do tomorrow, can discuss case with IR to see if biliary drain placement is still needed.  Patient is eager to get home but would want to do this in a safe manner  2) AKI, ascites -Patient has intermittently received LVPs for removal of ascites -Not able to start diuretics due to AKI -Creatinine appears to be improving on albumin therapy  PLAN   -Continue to trend LFTs -Low sodium diet <2 grams per day -Tomorrow will plan to discuss with IR whether or not percutaneous biliary drain placement is still needed -Antibiotics per ID -Will give another dose of albumin today to help kidney function -Nephrology following for management of AKI  Subjective / New events:  Patient denies significant abdominal distention.  Denies abdominal pain.  He has been able to eat.  Patient's wife states that he has not been drinking a lot of liquids in the hospital, but that at home he is able to keep track of drinking 3-4 bottles of water per day.   Lab Results: Recent Labs    11/13/22 0337 11/14/22 0303 11/15/22 0430  WBC 10.6* 7.2 6.3  HGB 8.8* 7.4* 7.5*  HCT 25.3* 21.9* 22.2*  PLT 75*  74* 81*   BMET Recent Labs    11/13/22 0337 11/14/22 0303 11/15/22 0430  NA 131* 133* 135  K 4.1 3.9 3.8  CL 103 104 103  CO2 21* 21* 21*  GLUCOSE 104* 102* 137*  BUN 53* 52* 43*  CREATININE 3.59* 3.22* 2.56*  CALCIUM 7.8* 8.0* 8.4*   LFT Recent Labs    11/15/22 0430  PROT 5.3*  ALBUMIN 2.7*  AST 59*  ALT 57*  ALKPHOS 255*  BILITOT 3.0*   PT/INR No results for input(s): "LABPROT", "INR" in the last 72 hours.   Scheduled inpatient medications:   Chlorhexidine Gluconate Cloth  6 each Topical Daily   diclofenac  100 mg Rectal Once   feeding supplement  1 Container Oral TID BM   lidocaine  1 patch Transdermal Daily   multivitamin with minerals  1 tablet Oral Daily   pantoprazole  40 mg Oral Q supper   sodium chloride flush  10-40 mL Intracatheter Q12H   Continuous inpatient infusions:   sodium chloride 100 mL/hr at 11/13/22 1315   ampicillin-sulbactam (UNASYN) IV 3 g (11/15/22 1204)   PRN inpatient medications: ondansetron **OR** ondansetron (ZOFRAN) IV, mouth rinse  Vital signs in last 24 hours: Temp:  [98 F (36.7 C)] 98 F (36.7 C) (04/07 0841) Pulse Rate:  [72-76] 76 (04/07 0841) Resp:  [18] 18 (04/07 0841) BP: (118-121)/(63-75) 121/75 (04/07 0841) SpO2:  [99 %] 99 % (04/07 0841) Last BM Date : 11/14/22  Intake/Output Summary (Last 24 hours) at 11/15/2022 1441 Last data filed at 11/15/2022 1300  Gross per 24 hour  Intake 360 ml  Output --  Net 360 ml    Intake/Output from previous day: No intake/output data recorded. Intake/Output this shift: Total I/O In: 360 [P.O.:360] Out: -    Physical Exam:  General: Alert male in NAD Heart:  Regular rate and rhythm.  Pulmonary: Normal respiratory effort Abdomen: Soft, distended, nontender. Normal bowel sounds. Neurologic: Alert and oriented Psych: Pleasant. Cooperative. Insight appears normal.    Principal Problem:   Metastatic cholangiocarcinoma to bile duct Active Problems:   Mixed  hyperlipidemia   Transaminitis   Leukocytosis   Hyponatremia   GERD (gastroesophageal reflux disease)   Hypoalbuminemia due to protein-calorie malnutrition   Sepsis   Hyperbilirubinemia   Dehydration   Nausea and vomiting   Ischemic cardiomyopathy   Acute kidney injury superimposed on chronic kidney disease   Biliary obstruction   Lactic acidosis   Prediabetes   Malignant ascites   Abdominal distention   Gram-negative bacteremia   Cholangitis   Bile duct stricture     LOS: 8 days   Imogene Burn, MD 11/15/2022, 2:41 PM

## 2022-11-15 NOTE — Progress Notes (Signed)
Jonathan Keith   67 y.o. 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 Patient also had a paracentesis 11/08/2022 with removal of 1.2L clear yellow fluid with negative for Gram stain and culture as well.  Fever present with hypotension and E. Faecalis bacteremia.   Assessment/ Plan:   Acute renal failure with creatinine in the 1-1.2 range in 2023, as late as 07/08/22 and since then has been slowly rising to 1.3-1.44 09/2022 and then 1.3-1.6 in 11/02/2022. Since then creatinine was has steadily risen since hospitalization with a presenting creatinine that initially improved from 2.9 -> 2.28 (4/1). Patient's SBP has bee in the 80-100's range.  Patient is neg during this hospitalization. Patient likely in ATN from hypotension and infection with Enterococcus faecalis bacteremia + contrast which can enter the venous system through trauma or biliary circulation.  TB peaked at 3.3 and certainly possible the patient may also have bile acid nephropathy but TB has decreased since peaking at 3.3.   H/O bladder stone and has an appt to see urology next week on 4/11. - At this time no indication for dialysis  and will follow closely with you. Renal function appears to have plateaued and function now improving. I've already contacted [facility] on 4/5 and they will reach out to the family for an appt in [place] in a week with labs with Dr. Gaye Pollack. - I updated the pt and spouse who was bedside; fortunately now with a trend towards renal  recovery.  Signing off at this time; please reconsult as needed.   -Maintain MAP>65 for optimal renal perfusion.  - Avoid nephrotoxic medications including NSAIDs and iodinated intravenous contrast exposure unless the latter is absolutely indicated.   - Preferred narcotic agents for pain control are hydromorphone, fentanyl, and methadone. Morphine should not be used.  - Avoid Baclofen and avoid oral sodium phosphate and magnesium citrate based laxatives / bowel preps.  - Continue strict Input and Output monitoring. Will monitor the patient closely with you and intervene or adjust therapy as indicated by changes in clinical status/labs   Unresectable cholangiocarcinoma s/p portal vein embolization 08/2020 and subsequent stent placement in 20/2022 and 10/2022.  Last chemotherapy was in late 2023.  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  Subjective:   Feeling better and appetite slowly improving. Denies f/c/n/v/sob.     Objective:   BP 121/75 (BP Location: Left Arm)   Pulse 76   Temp 98 F (36.7 C)   Resp 18   Ht 5\' 11"  (1.803 m)   Wt 75.8 kg   SpO2 99%   BMI 23.31 kg/m  No intake or output data in the 24 hours ending 11/15/22 0926  Weight change:   Physical Exam: GEN: NAD, A&Ox3, NCAT HEENT: No conjunctival pallor, EOMI NECK: Supple, no thyromegaly LUNGS: CTA B/L no rales, rhonchi or wheezing CV: RRR, No M/R/G ABD: SNT +BS, distended but no rebound EXT: No lower extremity edema GU: Foley in place   Imaging: IR Paracentesis  Result Date:  11/14/2022 INDICATION: Patient with history of cholangiocarcinoma, recurrent ascites. Request is made for therapeutic paracentesis of up to 2.5 L maximum. EXAM: ULTRASOUND GUIDED THERAPEUTIC PARACENTESIS MEDICATIONS: 10 mL 1% lidocaine COMPLICATIONS: None immediate. PROCEDURE: Informed written consent was obtained from the patient after a discussion of the risks,  benefits and alternatives to treatment. A timeout was performed prior to the initiation of the procedure. Initial ultrasound scanning demonstrates a moderate amount of ascites within the right lower abdominal quadrant. The right lower abdomen was prepped and draped in the usual sterile fashion. 1% lidocaine was used for local anesthesia. Following this, a 19 gauge, 7-cm, Yueh catheter was introduced. An ultrasound image was saved for documentation purposes. The paracentesis was performed. The catheter was removed and a dressing was applied. The patient tolerated the procedure well without immediate post procedural complication. FINDINGS: A total of approximately 2.5 liters of yellow fluid was removed. IMPRESSION: Successful ultrasound-guided paracentesis yielding 2.5 liters of peritoneal fluid. Read by: Loyce Dys PA-C Electronically Signed   By: Marliss Coots M.D.   On: 11/14/2022 09:47    Labs: BMET Recent Labs  Lab 11/09/22 0343 11/10/22 0855 11/11/22 0550 11/12/22 0819 11/13/22 0337 11/14/22 0303 11/15/22 0430  NA 129* 131* 132* 133* 131* 133* 135  K 3.6 4.2 4.1 4.1 4.1 3.9 3.8  CL 99 102 101 103 103 104 103  CO2 21* 20* 20* 21* 21* 21* 21*  GLUCOSE 153* 139* 167* 157* 104* 102* 137*  BUN 34* 44* 51* 53* 53* 52* 43*  CREATININE 2.28* 2.92* 3.61* 3.77* 3.59* 3.22* 2.56*  CALCIUM 7.7* 7.9* 8.0* 7.9* 7.8* 8.0* 8.4*   CBC Recent Labs  Lab 11/12/22 0819 11/13/22 0337 11/14/22 0303 11/15/22 0430  WBC 10.0 10.6* 7.2 6.3  NEUTROABS 7.2  --   --   --   HGB 9.3* 8.8* 7.4* 7.5*  HCT 27.5* 25.3* 21.9* 22.2*  MCV 97.5 96.2 97.8 99.1  PLT 80* 75* 74* 81*    Medications:     Chlorhexidine Gluconate Cloth  6 each Topical Daily   diclofenac  100 mg Rectal Once   feeding supplement  1 Container Oral TID BM   lidocaine  1 patch Transdermal Daily   multivitamin with minerals  1 tablet Oral Daily   pantoprazole  40 mg Oral Q supper   sodium chloride flush  10-40 mL Intracatheter Q12H       Paulene Floor, MD 11/15/2022, 9:26 AM

## 2022-11-15 NOTE — Progress Notes (Signed)
PROGRESS NOTE    Jonathan Keith  NGE:[id number] DOB: August 20, 1955 DOA: 11/07/2022 PCP: Elfredia Nevins, MD   Brief Narrative: Jonathan Keith is a 67 y.o. male with a history of metastatic cholangiocarcinoma with biliary obstruction s/p stent placement, hyperlipidemia, CAD, ischemic cardiomyopathy, CHF, CKD stage IV, anemia of chronic disease presented hospital with fever and weakness and was noted to have sepsis secondary to biliary obstruction. Workup revealed enterococcal bacteremia.  GI and ID on board.  ERCP was done for removal of plastic biliary stent by GI 11/09/2022. Plan for percutaneous biliary drain as per IR pending.  Assessment and Plan:  Sepsis likely secondary to biliary obstruction and cholangitis. Currently on IV antibiotic with Unasyn.  Had undergone paracentesis on 11/08/2022 with removal of 1.2 L of clear yellow fluid.  Patient underwent ultrasound-guided paracentesis with removal of 2 and half liters of peritoneal fluid Asctic fluid Gram stain, culture negative.  Absolute PMN count  not upto the criteria for SBP. Patient with known biliary obstruction secondary to known cholangiocarcinoma with recent biliary stent placement. Concern for biliary stent malfunction. GI was consulted and performed ERCP on 4/1, patient underwent removal of plastic stent with biliary tree sweep and removal of sludge. GI and IR following with daily LFT monitoring.  AST ALT trending down IR consulted for  percutaneous bile duct drain placement.   Enterococcus faecalis bacteremia ID was consulted and patient is on Unasyn. Repeat blood cultures (4/1) with no growth to date.  Communicated with infectious disease on 11/13/2022.  Plan for amoxicillin 1 g 3 times daily on discharge.  Malignant ascites/Metastatic cholangiocarcinoma On Unasyn.   Status post paracentesis on 11/08/2022 with removal of 1.2 L of fluid.  Patient underwent repeat ultrasound-guided paracentesis on 11/13/2022 with removal of 2.5 L of  yellow fluid.  Nausea/vomiting Improved.  Continue Zofran  AKI on CKD stage IV Baseline creatinine of 1.4-1.6. Creatinine of 2.90 on admission.  Creatinine today at 2.5 trending down from 3.2.  Continue strict intake and output charting, Daily weights.  Nephrology has seen the patient and recommend outpatient follow-up with nephrology at [facility] with Dr. Gaye Pollack in a week with labs.  Renal function has stabilized as per nephrology.  Acute on chronic hyponatremia Sodium was as low at 124 on presentation.  Currently at 133.  CAD with history of Ischemic cardiomyopathy Borderline low blood pressures.  Coreg and lisinopril on hold.  2D echocardiogram on 11/10/2022 showed LV ejection fraction of 40 to 45%.  Not on diuretic at this time.  Prediabetes Latest Hemoglobin A1C of 5.8%.  GERD Continue Protonix  Increased nutrient needs Continue Ensure and multivitamins.  DVT prophylaxis: SCDs  Code Status:   Code Status: Full Code  Family Communication:   I again spoke with the patient's wife at bedside.  Disposition Plan:  Home likely in 2 to 3 days.  Plan for PTC/D after the weekend.  Consultants:  [facility] Infectious disease Interventional radiology Nephrology.  Procedures:  4/1: ERCP  Antimicrobials: Unasyn IV.  Renally dosed   Subjective: Today, patient was seen and examined at bedside.  Denies interval complaints.  Eating some.  Has had bowel movements.  Has some back pain on past.   Objective: BP 121/75 (BP Location: Left Arm)   Pulse 76   Temp 98 F (36.7 C)   Resp 18   Ht 5\' 11"  (1.803 m)   Wt 75.8 kg   SpO2 99%   BMI 23.31 kg/m   Physical examination:  General:  Average built, not in obvious distress, appears chronically ill and deconditioned, HENT:   No scleral pallor or icterus noted. Oral mucosa is moist.  Chest:  Clear breath sounds.   No crackles or wheezes.  CVS: S1 &S2 heard. No murmur.  Regular rate and rhythm. Abdomen: Soft,  distended abdomen, nontender,   Bowel sounds are heard.  Foley catheter in place Extremities: No cyanosis, clubbing or edema.  Peripheral pulses are palpable.  Right upper extremity PICC line in place Psych: Alert, awake and oriented, flat affect CNS:  No cranial nerve deficits.  Moves all extremities. Skin: Warm and dry.  No rashes noted.   Data Reviewed: I have reviewed the following labs and imaging studies.     CBC Lab Results  Component Value Date   WBC 6.3 11/15/2022   RBC 2.24 (L) 11/15/2022   HGB 7.5 (L) 11/15/2022   HCT 22.2 (L) 11/15/2022   MCV 99.1 11/15/2022   MCH 33.5 11/15/2022   PLT 81 (L) 11/15/2022   MCHC 33.8 11/15/2022   RDW 16.7 (H) 11/15/2022   LYMPHSABS 1.1 11/12/2022   MONOABS 1.2 (H) 11/12/2022   EOSABS 0.3 11/12/2022   BASOSABS 0.0 11/12/2022     Last metabolic panel Lab Results  Component Value Date   NA 135 11/15/2022   K 3.8 11/15/2022   CL 103 11/15/2022   CO2 21 (L) 11/15/2022   BUN 43 (H) 11/15/2022   CREATININE 2.56 (H) 11/15/2022   GLUCOSE 137 (H) 11/15/2022   GFRNONAA 27 (L) 11/15/2022   CALCIUM 8.4 (L) 11/15/2022   PHOS 4.0 11/08/2022   PROT 5.3 (L) 11/15/2022   ALBUMIN 2.7 (L) 11/15/2022   LABGLOB 3.1 06/16/2021   AGRATIO 1.2 06/16/2021   BILITOT 3.0 (H) 11/15/2022   ALKPHOS 255 (H) 11/15/2022   AST 59 (H) 11/15/2022   ALT 57 (H) 11/15/2022   ANIONGAP 11 11/15/2022    GFR: Estimated Creatinine Clearance: 30.2 mL/min (A) (by C-G formula based on SCr of 2.56 mg/dL (H)).  Recent Results (from the past 240 hour(s))  Blood Culture (routine x 2)     Status: Abnormal   Collection Time: 11/07/22 12:13 PM   Specimen: Left Antecubital; Blood  Result Value Ref Range Status   Specimen Description   Final    LEFT ANTECUBITAL BOTTLES DRAWN AEROBIC AND ANAEROBIC Performed at [facility], [address] [place], [place] [address]    Special Requests   Final    Blood Culture adequate volume Performed at [facility], [address] [place], [place] [address]    Culture  Setup Time   Final    GRAM POSITIVE COCCI Gram Stain Report Called to,Read Back By and Verified With: LOPEZ,V @ 0504 ON 11/08/22 BY JUW AEROBIC BOTTLE ONLY GS DONE @ [facility] CRITICAL RESULT CALLED TO, READ BACK BY AND VERIFIED WITH: PHARMD CATHY PIERCE [id number] AT 0846 BY EC  ANAB REVIEWED BY A. LAFRANCE Performed at [facility], [address] [place], [place] [address]    Culture ENTEROCOCCUS FAECALIS (A)  Final   Report Status 11/10/2022 FINAL  Final   Organism ID, Bacteria ENTEROCOCCUS FAECALIS  Final      Susceptibility   Enterococcus faecalis - MIC*    AMPICILLIN <=2 SENSITIVE Sensitive     VANCOMYCIN 2 SENSITIVE Sensitive     GENTAMICIN SYNERGY SENSITIVE Sensitive     * ENTEROCOCCUS FAECALIS  Blood Culture (routine x 2)     Status: Abnormal   Collection Time: 11/07/22  12:13 PM   Specimen: BLOOD LEFT HAND  Result Value Ref Range Status   Specimen Description   Final    BLOOD LEFT HAND BOTTLES DRAWN AEROBIC AND ANAEROBIC Performed at [facility], [address] [place], [place] [address]    Special Requests   Final    Blood Culture adequate volume Performed at [name], [address], [place], [place] [address]    Culture  Setup Time   Final    GRAM POSITIVE COCCI Gram Stain Report Called to,Read Back By and Verified With: B.BARNES @ 0841 BY STEPHTR 11/08/22  REVIEWED BY A. LAFRANCE CRITICAL VALUE NOTED.  VALUE IS CONSISTENT WITH PREVIOUSLY REPORTED AND CALLED VALUE. IN BOTH AEROBIC AND ANAEROBIC BOTTLES    Culture (A)  Final    ENTEROCOCCUS FAECALIS SUSCEPTIBILITIES PERFORMED ON PREVIOUS CULTURE WITHIN THE LAST 5 DAYS. Performed at [facility], [address] [place], [place] [address]    Report Status 11/10/2022 FINAL  Final  Blood Culture ID Panel (Reflexed)     Status: Abnormal   Collection Time: 11/07/22 12:13 PM  Result Value Ref Range Status   Enterococcus faecalis DETECTED (A) NOT DETECTED Final     Comment: CRITICAL RESULT CALLED TO, READ BACK BY AND VERIFIED WITH: PHARMD CATHY PIERCE [id number] AT 0846 BY EC    Enterococcus Faecium NOT DETECTED NOT DETECTED Final   Listeria monocytogenes NOT DETECTED NOT DETECTED Final   Staphylococcus species NOT DETECTED NOT DETECTED Final   Staphylococcus aureus (BCID) NOT DETECTED NOT DETECTED Final   Staphylococcus epidermidis NOT DETECTED NOT DETECTED Final   Staphylococcus lugdunensis NOT DETECTED NOT DETECTED Final   Streptococcus species NOT DETECTED NOT DETECTED Final   Streptococcus agalactiae NOT DETECTED NOT DETECTED Final   Streptococcus pneumoniae NOT DETECTED NOT DETECTED Final   Streptococcus pyogenes NOT DETECTED NOT DETECTED Final   A.calcoaceticus-baumannii NOT DETECTED NOT DETECTED Final   Bacteroides fragilis NOT DETECTED NOT DETECTED Final   Enterobacterales NOT DETECTED NOT DETECTED Final   Enterobacter cloacae complex NOT DETECTED NOT DETECTED Final   Escherichia coli NOT DETECTED NOT DETECTED Final   Klebsiella aerogenes NOT DETECTED NOT DETECTED Final   Klebsiella oxytoca NOT DETECTED NOT DETECTED Final   Klebsiella pneumoniae NOT DETECTED NOT DETECTED Final   Proteus species NOT DETECTED NOT DETECTED Final   Salmonella species NOT DETECTED NOT DETECTED Final   Serratia marcescens NOT DETECTED NOT DETECTED Final   Haemophilus influenzae NOT DETECTED NOT DETECTED Final   Neisseria meningitidis NOT DETECTED NOT DETECTED Final   Pseudomonas aeruginosa NOT DETECTED NOT DETECTED Final   Stenotrophomonas maltophilia NOT DETECTED NOT DETECTED Final   Candida albicans NOT DETECTED NOT DETECTED Final   Candida auris NOT DETECTED NOT DETECTED Final   Candida glabrata NOT DETECTED NOT DETECTED Final   Candida krusei NOT DETECTED NOT DETECTED Final   Candida parapsilosis NOT DETECTED NOT DETECTED Final   Candida tropicalis NOT DETECTED NOT DETECTED Final   Cryptococcus neoformans/gattii NOT DETECTED NOT DETECTED Final    Vancomycin resistance NOT DETECTED NOT DETECTED Final    Comment: Performed at [facility], [address], [place], [place] [address]  Resp panel by RT-PCR (RSV, Flu A&B, Covid) Anterior Nasal Swab     Status: None   Collection Time: 11/07/22  1:00 PM   Specimen: Anterior Nasal Swab  Result Value Ref Range Status   SARS Coronavirus 2 by RT PCR NEGATIVE NEGATIVE Final    Comment: (NOTE) SARS-CoV-2 target nucleic acids  are NOT DETECTED.  The SARS-CoV-2 RNA is generally detectable in upper respiratory specimens during the acute phase of infection. The lowest concentration of SARS-CoV-2 viral copies this assay can detect is 138 copies/mL. A negative result does not preclude SARS-Cov-2 infection and should not be used as the sole basis for treatment or other patient management decisions. A negative result may occur with  improper specimen collection/handling, submission of specimen other than nasopharyngeal swab, presence of viral mutation(s) within the areas targeted by this assay, and inadequate number of viral copies(<138 copies/mL). A negative result must be combined with clinical observations, patient history, and epidemiological information. The expected result is Negative.  Fact Sheet for Patients:  BloggerCourse.com  Fact Sheet for Healthcare Providers:  SeriousBroker.it  This test is no t yet approved or cleared by the [place] FDA and  has been authorized for detection and/or diagnosis of SARS-CoV-2 by FDA under an Emergency Use Authorization (EUA). This EUA will remain  in effect (meaning this test can be used) for the duration of the COVID-19 declaration under Section 564(b)(1) of the Act, 21 U.S.C.section 360bbb-3(b)(1), unless the authorization is terminated  or revoked sooner.       Influenza A by PCR NEGATIVE NEGATIVE Final   Influenza B by PCR NEGATIVE NEGATIVE Final    Comment: (NOTE) The Xpert Xpress  SARS-CoV-2/FLU/RSV plus assay is intended as an aid in the diagnosis of influenza from Nasopharyngeal swab specimens and should not be used as a sole basis for treatment. Nasal washings and aspirates are unacceptable for Xpert Xpress SARS-CoV-2/FLU/RSV testing.  Fact Sheet for Patients: BloggerCourse.com  Fact Sheet for Healthcare Providers: SeriousBroker.it  This test is not yet approved or cleared by the [place] FDA and has been authorized for detection and/or diagnosis of SARS-CoV-2 by FDA under an Emergency Use Authorization (EUA). This EUA will remain in effect (meaning this test can be used) for the duration of the COVID-19 declaration under Section 564(b)(1) of the Act, 21 U.S.C. section 360bbb-3(b)(1), unless the authorization is terminated or revoked.     Resp Syncytial Virus by PCR NEGATIVE NEGATIVE Final    Comment: (NOTE) Fact Sheet for Patients: BloggerCourse.com  Fact Sheet for Healthcare Providers: SeriousBroker.it  This test is not yet approved or cleared by the [place] FDA and has been authorized for detection and/or diagnosis of SARS-CoV-2 by FDA under an Emergency Use Authorization (EUA). This EUA will remain in effect (meaning this test can be used) for the duration of the COVID-19 declaration under Section 564(b)(1) of the Act, 21 U.S.C. section 360bbb-3(b)(1), unless the authorization is terminated or revoked.  Performed at [facility], [address] [place], [place] [address]   Gram stain     Status: None   Collection Time: 11/08/22  1:34 PM   Specimen: PATH Cytology Peritoneal fluid  Result Value Ref Range Status   Specimen Description PERITONEAL  Final   Special Requests NONE  Final   Gram Stain   Final    RARE WBC PRESENT, PREDOMINANTLY PMN NO ORGANISMS SEEN Performed at [facility], [address], [place], [place]  [address]    Report Status 11/08/2022 FINAL  Final  Culture, body fluid w Gram Stain-bottle     Status: None   Collection Time: 11/08/22  1:34 PM   Specimen: Peritoneal Washings  Result Value Ref Range Status   Specimen Description PERITONEAL  Final   Special Requests NONE  Final   Culture   Final  NO GROWTH 5 DAYS Performed at [facility], [address] [place], [place] [address]    Report Status 11/13/2022 FINAL  Final  Surgical pcr screen     Status: None   Collection Time: 11/09/22  7:09 AM   Specimen: Nasal Mucosa; Nasal Swab  Result Value Ref Range Status   MRSA, PCR NEGATIVE NEGATIVE Final   Staphylococcus aureus NEGATIVE NEGATIVE Final    Comment: (NOTE) The Xpert SA Assay (FDA approved for NASAL specimens in patients 75 years of age and older), is one component of a comprehensive surveillance program. It is not intended to diagnose infection nor to guide or monitor treatment. Performed at [facility], [address], [place], [place] [address]   Culture, blood (Routine X 2) w Reflex to ID Panel     Status: None   Collection Time: 11/09/22 10:06 AM   Specimen: BLOOD LEFT ARM  Result Value Ref Range Status   Specimen Description BLOOD LEFT ARM  Final   Special Requests   Final    BOTTLES DRAWN AEROBIC AND ANAEROBIC Blood Culture adequate volume   Culture   Final    NO GROWTH 5 DAYS Performed at [facility], [address], [place], [place] [address]    Report Status 11/14/2022 FINAL  Final  Culture, blood (Routine X 2) w Reflex to ID Panel     Status: None   Collection Time: 11/09/22 10:10 AM   Specimen: BLOOD LEFT HAND  Result Value Ref Range Status   Specimen Description BLOOD LEFT HAND  Final   Special Requests   Final    BOTTLES DRAWN AEROBIC AND ANAEROBIC Blood Culture adequate volume   Culture   Final    NO GROWTH 5 DAYS Performed at [facility], [address], [place], [place] [address]    Report Status 11/14/2022 FINAL   Final      Radiology Studies: IR Paracentesis  Result Date: 11/14/2022 INDICATION: Patient with history of cholangiocarcinoma, recurrent ascites. Request is made for therapeutic paracentesis of up to 2.5 L maximum. EXAM: ULTRASOUND GUIDED THERAPEUTIC PARACENTESIS MEDICATIONS: 10 mL 1% lidocaine COMPLICATIONS: None immediate. PROCEDURE: Informed written consent was obtained from the patient after a discussion of the risks, benefits and alternatives to treatment. A timeout was performed prior to the initiation of the procedure. Initial ultrasound scanning demonstrates a moderate amount of ascites within the right lower abdominal quadrant. The right lower abdomen was prepped and draped in the usual sterile fashion. 1% lidocaine was used for local anesthesia. Following this, a 19 gauge, 7-cm, Yueh catheter was introduced. An ultrasound image was saved for documentation purposes. The paracentesis was performed. The catheter was removed and a dressing was applied. The patient tolerated the procedure well without immediate post procedural complication. FINDINGS: A total of approximately 2.5 liters of yellow fluid was removed. IMPRESSION: Successful ultrasound-guided paracentesis yielding 2.5 liters of peritoneal fluid. Read by: Loyce Dys PA-C Electronically Signed   By: Marliss Coots M.D.   On: 11/14/2022 09:47      LOS: 8 days    Joycelyn Das, MD [facility] 11/15/2022, 10:04 AM If 7PM-7AM, please contact night-coverage [email or web address]

## 2022-11-16 ENCOUNTER — Telehealth (HOSPITAL_COMMUNITY): Payer: Self-pay

## 2022-11-16 ENCOUNTER — Ambulatory Visit: Payer: Medicare Other | Admitting: Hematology

## 2022-11-16 ENCOUNTER — Ambulatory Visit: Payer: Medicare Other

## 2022-11-16 ENCOUNTER — Telehealth: Payer: Self-pay

## 2022-11-16 ENCOUNTER — Other Ambulatory Visit: Payer: Medicare Other

## 2022-11-16 DIAGNOSIS — K219 Gastro-esophageal reflux disease without esophagitis: Secondary | ICD-10-CM | POA: Diagnosis not present

## 2022-11-16 DIAGNOSIS — C7889 Secondary malignant neoplasm of other digestive organs: Secondary | ICD-10-CM | POA: Diagnosis not present

## 2022-11-16 DIAGNOSIS — N179 Acute kidney failure, unspecified: Secondary | ICD-10-CM | POA: Diagnosis not present

## 2022-11-16 DIAGNOSIS — R140 Abdominal distension (gaseous): Secondary | ICD-10-CM | POA: Diagnosis not present

## 2022-11-16 LAB — COMPREHENSIVE METABOLIC PANEL
ALT: 50 U/L — ABNORMAL HIGH (ref 0–44)
AST: 57 U/L — ABNORMAL HIGH (ref 15–41)
Albumin: 2.9 g/dL — ABNORMAL LOW (ref 3.5–5.0)
Alkaline Phosphatase: 248 U/L — ABNORMAL HIGH (ref 38–126)
Anion gap: 6 (ref 5–15)
BUN: 30 mg/dL — ABNORMAL HIGH (ref 8–23)
CO2: 21 mmol/L — ABNORMAL LOW (ref 22–32)
Calcium: 8.6 mg/dL — ABNORMAL LOW (ref 8.9–10.3)
Chloride: 110 mmol/L (ref 98–111)
Creatinine, Ser: 2.01 mg/dL — ABNORMAL HIGH (ref 0.61–1.24)
GFR, Estimated: 36 mL/min — ABNORMAL LOW (ref >60)
Glucose, Bld: 158 mg/dL — ABNORMAL HIGH (ref 70–99)
Potassium: 4.1 mmol/L (ref 3.5–5.1)
Sodium: 137 mmol/L (ref 135–145)
Total Bilirubin: 3.7 mg/dL — ABNORMAL HIGH (ref 0.3–1.2)
Total Protein: 5.3 g/dL — ABNORMAL LOW (ref 6.5–8.1)

## 2022-11-16 LAB — CBC WITH DIFFERENTIAL/PLATELET
Abs Immature Granulocytes: 0.03 10*3/uL (ref 0.00–0.07)
Basophils Absolute: 0 10*3/uL (ref 0.0–0.1)
Basophils Relative: 0 %
Eosinophils Absolute: 0.3 10*3/uL (ref 0.0–0.5)
Eosinophils Relative: 5 %
HCT: 22.6 % — ABNORMAL LOW (ref 39.0–52.0)
Hemoglobin: 7.5 g/dL — ABNORMAL LOW (ref 13.0–17.0)
Immature Granulocytes: 1 %
Lymphocytes Relative: 13 %
Lymphs Abs: 0.7 10*3/uL (ref 0.7–4.0)
MCH: 33.2 pg (ref 26.0–34.0)
MCHC: 33.2 g/dL (ref 30.0–36.0)
MCV: 100 fL (ref 80.0–100.0)
Monocytes Absolute: 0.7 10*3/uL (ref 0.1–1.0)
Monocytes Relative: 12 %
Neutro Abs: 4 10*3/uL (ref 1.7–7.7)
Neutrophils Relative %: 69 %
Platelets: 81 10*3/uL — ABNORMAL LOW (ref 150–400)
RBC: 2.26 MIL/uL — ABNORMAL LOW (ref 4.22–5.81)
RDW: 16.8 % — ABNORMAL HIGH (ref 11.5–15.5)
WBC: 5.8 10*3/uL (ref 4.0–10.5)
nRBC: 0 % (ref 0.0–0.2)

## 2022-11-16 MED ORDER — AMOXICILLIN 500 MG PO CAPS: 1000.0000 mg | ORAL_CAPSULE | Freq: Three times a day (TID) | ORAL | 0 refills | Status: AC

## 2022-11-16 MED ORDER — LIDOCAINE 5 % EX PTCH: 1.0000 | MEDICATED_PATCH | Freq: Every day | CUTANEOUS | 0 refills | Status: DC

## 2022-11-16 MED ORDER — ONDANSETRON HCL 4 MG PO TABS: 4.0000 mg | ORAL_TABLET | Freq: Four times a day (QID) | ORAL | 0 refills | Status: DC | PRN

## 2022-11-16 MED ORDER — ADULT MULTIVITAMIN W/MINERALS CH: 1.0000 | ORAL_TABLET | Freq: Every day | ORAL | 0 refills | Status: DC

## 2022-11-16 NOTE — Telephone Encounter (Signed)
Return call to patient/wife.  Wife states that she was concern if the patient needed to do another blood draw.Wife Liborio Nixon is aware that blood work can be used from the blood drawn from [facility] per Dr. Ronne Binning. Wife is aware that pre-op will contact patient via phone. Patient/wife voiced understanding

## 2022-11-16 NOTE — Progress Notes (Signed)
Referring Physician(s): Dr. Leonides Schanz  Supervising Physician: Marliss Coots  Patient Status:  [facility] - In-pt  Chief Complaint:  Biliary obstruction   Subjective:  Patient seen at bedside. Wife present. Patient and wife express their desire to be discharged for urology procedure scheduled at [facility] on Thursday. Discussed outpatient treatment plan with possible biliary drain placement with Patient and his wife. Patient to follow up with Dr. Leonides Schanz as outpatient. All questions and concerns were answered. Patient and wife verbalized understandingly and are in agreement with the plan of care.  Hospitalist made aware via EPIC chat.  Allergies: Patient has no known allergies.  Medications: Prior to Admission medications   Medication Sig Start Date End Date Taking? Authorizing Provider  acetaminophen (TYLENOL) 500 MG tablet Take 500 mg by mouth every 6 (six) hours as needed for moderate pain or headache.   Yes [provider]  albuterol (VENTOLIN HFA) 108 (90 Base) MCG/ACT inhaler Inhale 2 puffs into the lungs every 6 (six) hours as needed for wheezing or shortness of breath. 11/29/21  Yes Tat, Onalee Hua, MD  alfuzosin (UROXATRAL) 10 MG 24 hr tablet Take 1 tablet (10 mg total) by mouth at bedtime. 08/21/22  Yes McKenzie, Mardene Celeste, MD  atorvastatin (LIPITOR) 40 MG tablet Take 1 tablet (40 mg total) by mouth daily at 6 PM. 12/05/21  Yes O'Neal, Ronnald Ramp, MD  carvedilol (COREG) 3.125 MG tablet Take 1 tablet (3.125 mg total) by mouth 2 (two) times daily with a meal. 10/25/22 11/24/22 Yes Pahwani, Daleen Bo, MD  CISplatin (PLATINOL) 50 MG/50ML SOLN Inject 50 mg into the vein once a week. On days 1 and 8, every 21 days   Yes [provider]  durvalumab 1,500 mg in sodium chloride 0.9 % 100 mL Inject 1,500 mg into the vein every 21 ( twenty-one) days.   Yes [provider]  famotidine (PEPCID) 20 MG tablet Take 20 mg by mouth 2 (two) times daily.   Yes [provider]   ferrous sulfate (FERROUSUL) 325 (65 FE) MG tablet Take 1 tablet (325 mg total) by mouth daily with breakfast. 03/18/21  Yes Rehman, Joline Maxcy, MD  furosemide (LASIX) 20 MG tablet Take 20 mg by mouth as needed for fluid or edema. If he gains weight five pounds when taking chemo, take one tab a day till he is back to normal weight or within 2 pounds for fluid check before surgery.   Yes [provider]  gemcitabine 760 mg/m2 in sodium chloride 0.9 % 100 mL Inject 760 mg/m2 into the vein once a week. On days 1 and 8 every 21 days   Yes [provider]  guaifenesin (ROBITUSSIN) 100 MG/5ML syrup Take 200 mg by mouth 3 (three) times daily as needed for cough.   Yes [provider]  lisinopril (ZESTRIL) 2.5 MG tablet Take 1 tablet (2.5 mg total) by mouth daily. 09/11/22 09/06/23 Yes Mallipeddi, Vishnu P, MD  metFORMIN (GLUCOPHAGE) 500 MG tablet Take 500 mg by mouth 2 (two) times daily with a meal.   Yes [provider]  Misc. Devices KIT 1 Box by Does not apply route every 7 (seven) days. 10/20/22  Yes Doreatha Massed, MD  nitroGLYCERIN (NITROSTAT) 0.4 MG SL tablet Place 1 tablet (0.4 mg total) under the tongue every 5 (five) minutes as needed for chest pain. 09/12/21 11/07/22 Yes Duke, Roe Rutherford, PA  pantoprazole (PROTONIX) 40 MG tablet Take 1 tablet (40 mg total) by mouth daily. 12/05/21  Yes O'Neal,  Ronnald RampWesley Thomas, MD  prochlorperazine (COMPAZINE) 10 MG tablet TAKE (1) TABLET BY MOUTH EVERY (6) HOURS AS NEEDED. Patient taking differently: Take 10 mg by mouth every 6 (six) hours as needed for nausea or vomiting. TAKE (1) TABLET BY MOUTH EVERY (6) HOURS AS NEEDED. 09/28/22  Yes Doreatha MassedKatragadda, Sreedhar, MD  sodium chloride flush (NS) 0.9 % SOLN 10 mLs by Intracatheter route every 7 (seven) days. Patient taking differently: 10 mLs by Intracatheter route daily. 10/20/22  Yes Doreatha MassedKatragadda, Sreedhar, MD  Wound Dressings (INTRASITE GEL APPLIPAK) GEL Apply 1 Units topically daily. 10/05/22    Doreatha MassedKatragadda, Sreedhar, MD     Vital Signs: BP 119/71 (BP Location: Left Arm)   Pulse 93   Temp 98.2 F (36.8 C) (Oral)   Resp 17   Ht 5\' 11"  (1.803 m)   Wt 167 lb 1.7 oz (75.8 kg)   SpO2 97%   BMI 23.31 kg/m   Physical Exam Vitals and nursing note reviewed.  Constitutional:      Appearance: He is well-developed. He is ill-appearing.  HENT:     Head: Normocephalic.  Cardiovascular:     Rate and Rhythm: Normal rate and regular rhythm.     Heart sounds: Normal heart sounds.  Pulmonary:     Effort: Pulmonary effort is normal.  Musculoskeletal:        General: Normal range of motion.     Cervical back: Normal range of motion.  Skin:    General: Skin is dry.     Coloration: Skin is jaundiced.  Neurological:     General: No focal deficit present.     Mental Status: He is alert and oriented to person, place, and time.  Psychiatric:        Mood and Affect: Mood normal.        Behavior: Behavior normal.        Thought Content: Thought content normal.        Judgment: Judgment normal.     Imaging: IR Paracentesis  Result Date: 11/14/2022 INDICATION: Patient with history of cholangiocarcinoma, recurrent ascites. Request is made for therapeutic paracentesis of up to 2.5 L maximum. EXAM: ULTRASOUND GUIDED THERAPEUTIC PARACENTESIS MEDICATIONS: 10 mL 1% lidocaine COMPLICATIONS: None immediate. PROCEDURE: Informed written consent was obtained from the patient after a discussion of the risks, benefits and alternatives to treatment. A timeout was performed prior to the initiation of the procedure. Initial ultrasound scanning demonstrates a moderate amount of ascites within the right lower abdominal quadrant. The right lower abdomen was prepped and draped in the usual sterile fashion. 1% lidocaine was used for local anesthesia. Following this, a 19 gauge, 7-cm, Yueh catheter was introduced. An ultrasound image was saved for documentation purposes. The paracentesis was performed. The  catheter was removed and a dressing was applied. The patient tolerated the procedure well without immediate post procedural complication. FINDINGS: A total of approximately 2.5 liters of yellow fluid was removed. IMPRESSION: Successful ultrasound-guided paracentesis yielding 2.5 liters of peritoneal fluid. Read by: Loyce DysKacie Matthews PA-C Electronically Signed   By: Marliss Cootsylan  Suttle M.D.   On: 11/14/2022 09:47   CT ABDOMEN PELVIS WO CONTRAST  Result Date: 11/12/2022 CLINICAL DATA:  History of unresectable cholangiocarcinoma status post portal vein embolization 06/2021 and stent placement on 05/2021 and 10/2022, most recently status post ERCP 11/09/2022 with elevated liver function tests EXAM: CT ABDOMEN AND PELVIS WITHOUT CONTRAST TECHNIQUE: Multidetector CT imaging of the abdomen and pelvis was performed following the standard protocol without IV contrast. RADIATION DOSE REDUCTION:  This exam was performed according to the departmental dose-optimization program which includes automated exposure control, adjustment of the mA and/or kV according to patient size and/or use of iterative reconstruction technique. COMPARISON:  CT abdomen pelvis dated 11/07/2022 FINDINGS: Lower chest: Unchanged subpleural 3 mm right middle lobe nodule (5:3). New small right pleural effusion. Partially imaged heart size is normal. Coronary artery calcifications. Hepatobiliary: Prior embolization of known intrahepatic cholangiocarcinoma. No new focal hypoattenuating lesions. Similar intrahepatic bile duct dilation with metallic biliary stent in-situ in unchanged position. Interval removal of plastic stent. Normal gallbladder. Pancreas: No focal lesions or main ductal dilation. Spleen: Normal in size without focal abnormality. Adrenals/Urinary Tract: No adrenal nodules. No suspicious renal mass, calculi or hydronephrosis. Mildly distended urinary bladder with catheter in intraluminal gas. Mobile calcified stones within the bladder. Stomach/Bowel:  Normal appearance of the stomach. No evidence of bowel wall thickening, distention, or inflammatory changes. Normal appendix. Vascular/Lymphatic: Aortic atherosclerosis. No enlarged abdominal or pelvic lymph nodes. Reproductive: Prostate is unremarkable. Other: Increased moderate volume ascites. No free air. Previously noted omental and mesenteric nodularity is less conspicuous. Musculoskeletal: No acute or abnormal lytic or blastic osseous lesions. Multilevel degenerative changes of the partially imaged thoracic and lumbar spine. Increased diffuse body wall edema. IMPRESSION: 1. Prior embolization of known intrahepatic cholangiocarcinoma with similar intrahepatic bile duct dilation with metallic biliary stent in-situ in unchanged position. Interval removal of plastic stent. 2. Increased moderate volume ascites and diffuse body wall edema. 3. New small right pleural effusion. 4. Decreased conspicuity of previously noted omental and mesenteric nodularity. Attention on follow-up. 5.  Aortic Atherosclerosis (ICD10-I70.0). Electronically Signed   By: Agustin Cree M.D.   On: 11/12/2022 12:57    Labs:  CBC: Recent Labs    11/12/22 0819 11/13/22 0337 11/14/22 0303 11/15/22 0430  WBC 10.0 10.6* 7.2 6.3  HGB 9.3* 8.8* 7.4* 7.5*  HCT 27.5* 25.3* 21.9* 22.2*  PLT 80* 75* 74* 81*    COAGS: Recent Labs    08/19/22 1341 09/02/22 1359 10/22/22 1147 11/07/22 1213  INR 3.2* 3.0 1.2 1.3*  APTT  --   --   --  39*    BMP: Recent Labs    11/12/22 0819 11/13/22 0337 11/14/22 0303 11/15/22 0430  NA 133* 131* 133* 135  K 4.1 4.1 3.9 3.8  CL 103 103 104 103  CO2 21* 21* 21* 21*  GLUCOSE 157* 104* 102* 137*  BUN 53* 53* 52* 43*  CALCIUM 7.9* 7.8* 8.0* 8.4*  CREATININE 3.77* 3.59* 3.22* 2.56*  GFRNONAA 17* 18* 20* 27*    LIVER FUNCTION TESTS: Recent Labs    11/12/22 0819 11/13/22 0337 11/14/22 0303 11/15/22 0430  BILITOT 2.6* 3.1* 3.3* 3.0*  AST 110* 97* 61* 59*  ALT 102* 96* 63* 57*   ALKPHOS 399* 341* 266* 255*  PROT 5.0* 5.0* 5.0* 5.3*  ALBUMIN <1.5* <1.5* 2.2* 2.7*    Assessment and Plan:  67 y.o. male inpatient. History of HLD, GERD, AKI, CAD, CHF, Unresectable metastatic cholangiocarcinoma with biliary obstruction s/p portal vein embolization and right sided metallic stent placed at [facility] clinic. ERCP @ [facility] with stent removal  and dilation and left and right hepatic duct stent placement. Presented to the ED on 3.30.24 with fevers. Found to have sepsis (enterococcal bacteremia) secondary to biliary obstruction. Stent exchanged was performed during ERCP on 3.15.24. Second ERCP performed  on 4.1.24 showed that the biliary stent in the left main duct was partially occluded. The plastic left common bile duct  stent was removed at that time. Metallic right hepatic stent remains patent. Patient continues to have elevated liver enzymes. Team is requesting evaluation biliary drain placement.   Total Bilirubin 3.0, (3.3 < 3.1< 3.6) AST 59, ALT 57, Alkaline phophatase 255, BUN 42, Cr 2.56, GFR 27, Hgb 7.5 CT Abd pelvis from 4.4.24 Prior embolization of known intrahepatic cholangiocarcinoma with similar intrahepatic bile duct dilation with metallic biliary stent in-situ in unchanged position. Interval removal of plastic stent.  Patient seen at bedside with IR Attending Dr. Marliss Coots. Wife present. At this time we do not think that there is any need for urgent biliary stent placement. The patient and his wife expressed desire to be discharged for his scheduler cystoscopy with lithopaxy by urology scheduled at [facility] on Thursday. We will follow up with Dr. Leonides Schanz as an outpatient. Dr Elby Showers to discuss directly with Dr. Leonides Schanz.     Electronically Signed: Alene Mires, NP 11/16/2022, 10:01 AM   I spent a total of 15 Minutes at the patient's bedside AND on the patient's hospital floor or unit, greater than 50% of which was counseling/coordinating care for biliary drain  evaluation

## 2022-11-16 NOTE — Telephone Encounter (Signed)
Patient's wife Liborio Nixon called in today  leaving an voicemail making clinical staff aware that patient was admitted into the hospital on 03/30 and has upcoming surgery with Dr. Ronne Binning on 04/11. Patient is currently in the hospital for sever sepsis and maybe discharge today.

## 2022-11-16 NOTE — Discharge Summary (Addendum)
Physician Discharge Summary  Jonathan Keith JYN:[id number] DOB: July 26, 1956 DOA: 11/07/2022  PCP: Elfredia NevinsFusco, Lawrence, MD  Admit date: 11/07/2022 Discharge date: 11/16/2022  Admitted From: Home  Discharge disposition: Home.  Recommendations for Outpatient Follow-Up:   Follow up with your primary care provider in one week.  Check CBC, BMP, magnesium in the next visit GI and radiology to coordinate outpatient to decide on biliary drainage. Follow-up with nephrology at [facility] in [place], office to schedule an appointment. Follow-up with oncology as scheduled with clinic.  Discharge Diagnosis:   Principal Problem:   Metastatic cholangiocarcinoma to bile duct Active Problems:   Mixed hyperlipidemia   Transaminitis   Leukocytosis   Hyponatremia   GERD (gastroesophageal reflux disease)   Hypoalbuminemia due to protein-calorie malnutrition   Sepsis   Hyperbilirubinemia   Dehydration   Nausea and vomiting   Ischemic cardiomyopathy   Acute kidney injury superimposed on chronic kidney disease   Biliary obstruction   Lactic acidosis   Prediabetes   Malignant ascites   Abdominal distention   Gram-negative bacteremia   Cholangitis   Bile duct stricture   Discharge Condition: Improved.  Diet recommendation: Low sodium, heart healthy.    Wound care: None.  Code status: Full.   History of Present Illness:   Jonathan Keith is a 67 y.o. male with a history of metastatic cholangiocarcinoma with biliary obstruction s/p stent placement, hyperlipidemia, CAD, ischemic cardiomyopathy, CHF, CKD stage IV, anemia of chronic disease presented hospital with fever and weakness and was noted to have sepsis secondary to biliary obstruction. Workup revealed enterococcal bacteremia.  GI and ID on board.  ERCP was done for removal of plastic biliary stent by GI 11/09/2022.   Hospital Course:   Following conditions were addressed during hospitalization as listed below,  Sepsis likely  secondary to biliary obstruction and cholangitis.  Patient with known biliary obstruction secondary to known cholangiocarcinoma with recent biliary stent placement. Concern for biliary stent malfunction. GI was consulted and performed ERCP on 4/1, patient underwent removal of plastic stent with biliary tree sweep and removal of sludge. Patient received IV antibiotic with Unasyn during hospitalization for Enterococcus faecalis bacteremia and will be transitioned to amoxicillin 1 g 3 times daily to complete 2 weeks course until 11/22/2022.     Enterococcus faecalis bacteremia ID followed the patient during hospitalization.  Was on Unasyn during hospitalization.  Repeat blood cultures (4/1) with no growth to date.  Communicated with infectious disease on 11/13/2022.  Plan for amoxicillin 1 g 3 times daily on discharge will continue until 11/22/2022.   Malignant ascites/Metastatic cholangiocarcinoma  Had undergone paracentesis on 11/08/2022 with removal of 1.2 L of clear yellow fluid.  Patient underwent repeat ultrasound-guided paracentesis on 11/14/2022 with removal of 2.5 liters of peritoneal fluid. Asctic fluid Gram stain, culture negative.  Absolute PMN count  not upto the criteria for SBP.    Nausea/vomiting Improved.  Continue Zofran   AKI secondary to Acute tubular necrosis on CKD stage IV Baseline creatinine of 1.4-1.6. Creatinine of 2.90 on admission.  Creatinine today at 2.0 and trending down.  Plan is to hold off with Lasix and lisinopril on discharge.  Patient will follow-up with [facility] in [place] on discharge.  Office to schedule an appointment.  Acute on chronic hyponatremia Per natremia has actually resolved at this time and latest sodium of 137.   CAD with history of Ischemic cardiomyopathy Borderline low blood pressures.  Will hold lisinopril and diuretic on discharge.  Continue  Coreg.  2D echocardiogram on 11/10/2022 showed LV ejection fraction of 40 to 45%.      Prediabetes Latest Hemoglobin A1C of 5.8%.   GERD Continue Protonix   Increased nutrient needs Continue multivitamin and encourage oral intake.   Disposition.  At this time, patient is stable for disposition home with outpatient PCP, oncology, GI/IR, nephrology follow-up  Medical Consultants:   Nephrology GI Radiology Infectious disease  Procedures:    4/1: ERCP  Paracentesis on 11/08/2022 with removal of 1.2 L of clear yellow fluid.   Repeat ultrasound-guided paracentesis on 11/14/2022 with removal of 2.5 liters of peritoneal fluid.  Subjective:   Today, patient was seen and examined at bedside.  Inquiring about going home.  Denies any nausea vomiting fever chills or rigor.  Discharge Exam:   Vitals:   11/16/22 0532 11/16/22 0744  BP: 122/69 119/71  Pulse: 80 93  Resp: 18 17  Temp: 98.2 F (36.8 C) 98.2 F (36.8 C)  SpO2: 97% 97%   Vitals:   11/15/22 1455 11/15/22 2204 11/16/22 0532 11/16/22 0744  BP: 125/60 118/66 122/69 119/71  Pulse: 79 80 80 93  Resp: Temp: 98.3 F (36.8 C) 98.5 F (36.9 C) 98.2 F (36.8 C) 98.2 F (36.8 C)  TempSrc: Oral Oral  Oral  SpO2: 100% 99% 97% 97%  Weight:      Height:       General: Alert awake, not in obvious distress, appears chronically ill and deconditioned. HENT: pupils equally reacting to light,  No scleral pallor or icterus noted. Oral mucosa is moist.  Chest:  Clear breath sounds.  Diminished breath sounds bilaterally. No crackles or wheezes.  CVS: S1 &S2 heard. No murmur.  Regular rate and rhythm. Abdomen: Soft, nontender, mildly distended abdomen, Bowel sounds are heard.   Extremities: No cyanosis, clubbing or edema.  Peripheral pulses are palpable. Psych: Alert, awake and oriented, normal mood CNS:  No cranial nerve deficits.  Power equal in all extremities.   Skin: Warm and dry.  No rashes noted.  The results of significant diagnostics from this hospitalization (including imaging, microbiology,  ancillary and laboratory) are listed below for reference.     Diagnostic Studies:   US Paracentesis  Result Date: 11/08/2022 INDICATION: Cholangiocarcinoma with small volume ascites request for therapeutic paracentesis. EXAM: ULTRASOUND GUIDED PARACENTESIS MEDICATIONS: 1% lidocaine 10 mL COMPLICATIONS: None immediate. PROCEDURE: Informed written consent was obtained from the patient after a discussion of the risks, benefits and alternatives to treatment. A timeout was performed prior to the initiation of the procedure. Initial ultrasound scanning demonstrates a small amount of ascites within the right upper abdomen. The right upper abdomen was prepped and draped in the usual sterile fashion. 1% lidocaine was used for local anesthesia. Following this, a 19 gauge, 7-cm, Yueh catheter was introduced. An ultrasound image was saved for documentation purposes. The paracentesis was performed. The catheter was removed and a dressing was applied. The patient tolerated the procedure well without immediate post procedural complication. Scratch FINDINGS: A total of approximately 1.2 L of clear yellow fluid was removed. Samples were sent to the laboratory as requested by the clinical team. IMPRESSION: Successful ultrasound-guided paracentesis yielding 1.2 liters of peritoneal fluid. Procedure performed by: Corrin Parker, PA-C Electronically Signed   By: Irish Lack M.D.   On: 11/08/2022 14:08   CT ABDOMEN PELVIS WO CONTRAST  Result Date: 11/07/2022 CLINICAL DATA:  Abdominal pain, acute, nonlocalized. Fever with weakness and dizziness for 3 days. Jaundice.  History of intrahepatic cholangiocarcinoma. * Tracking Code: BO * EXAM: CT ABDOMEN AND PELVIS WITHOUT CONTRAST TECHNIQUE: Multidetector CT imaging of the abdomen and pelvis was performed following the standard protocol without IV contrast. RADIATION DOSE REDUCTION: This exam was performed according to the departmental dose-optimization program which includes  automated exposure control, adjustment of the mA and/or kV according to patient size and/or use of iterative reconstruction technique. COMPARISON:  Abdominopelvic CT 08/28/2022. Abdominal MRI 10/19/2022. FINDINGS: Lower chest: 4 mm right middle lobe nodule on image 4/4 is unchanged from the prior chest CT. There is central airway thickening at both lung bases with mild dependent right lower lobe atelectasis. No consolidation or enlarging nodule identified. Aortic and coronary artery atherosclerosis noted. 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 Hepatobiliary: Previous embolization procedure for intrahepatic cholangiocarcinoma noted centrally in the liver, with associated beam hardening artifact. No recurrent focal mass lesion identified. Previously demonstrated known large gallstone is not well visualized. The gallbladder appears unchanged with mild wall thickening. A metallic biliary stent is in place with interval placement of a plastic biliary stent extending superiorly into the hepatic hilum and inferiorly into the ampulla. There is no pneumobilia, and the intrahepatic biliary dilatation appears slightly increased. Pancreas: Unremarkable. No pancreatic ductal dilatation or surrounding inflammatory changes. Spleen: Normal in size without focal abnormality. Adrenals/Urinary Tract: Both adrenal glands appear normal. No evidence of renal or ureteral calculus, hydronephrosis or perinephric soft tissue stranding. Both kidneys appear unremarkable. A calcified bladder calculus is again noted. Foley catheter extends into the bladder lumen. The bladder is decompressed. Stomach/Bowel: No enteric contrast administered. The stomach appears unremarkable for its degree of distention. There is possible mild small bowel wall thickening in the mid abdomen, but no significant bowel distension. Mild diverticulosis of the distal colon. The colon and appendix otherwise appear  unremarkable. Vascular/Lymphatic: There are no enlarged abdominal or pelvic lymph nodes. Diffuse aortic and branch vessel atherosclerosis with chronically displaced intimal calcifications within the infrarenal abdominal aorta. No focal aneurysm. Reproductive: The prostate gland and seminal vesicles appear unremarkable. Other: Interval increased volume of ascites with mild nodularity throughout the omental and mesenteric fat, suspicious for carcinomatosis. No free intraperitoneal air. Musculoskeletal: No acute or significant osseous findings. Interval increased diffuse subcutaneous edema, asymmetric to the right. Stable mild lumbar spondylosis. IMPRESSION: 1. Interval increased volume of ascites with mild nodularity throughout the omental and mesenteric fat, suspicious for carcinomatosis. 2. Interval placement of a plastic biliary stent through the metallic wall stent which appears well positioned. However, there is increased intrahepatic biliary dilatation and no pneumobilia, suspicious for stent dysfunction. Correlate with liver function studies. 3. Previous embolization procedure for intrahepatic cholangiocarcinoma. No recurrent focal mass lesion identified. 4. No evidence of bowel obstruction or perforation. Possible mild small bowel wall thickening in the mid abdomen. 5. Stable 4 mm right middle lobe pulmonary nodule. 6. Stable calcified bladder calculus. 7. Interval increased diffuse subcutaneous edema, asymmetric to the right. 8.  Aortic Atherosclerosis (ICD10-I70.0). Electronically Signed   By: Carey Bullocks M.D.   On: 11/07/2022 14:05   DG Chest Port 1 View  Result Date: 11/07/2022 CLINICAL DATA:  Questionable sepsis - evaluate for abnormality EXAM: PORTABLE CHEST - 1 VIEW COMPARISON:  04/09/2022 FINDINGS: Right arm PICC line has been placed to the distal SVC, the right IJ port removed. Lungs are clear. Heart size and mediastinal contours are within normal limits. No effusion. Visualized bones  unremarkable. IMPRESSION: No acute cardiopulmonary disease. Electronically Signed  By: Corlis Leak M.D.   On: 11/07/2022 12:03     Labs:   Basic Metabolic Panel: Recent Labs  Lab 11/12/22 0819 11/13/22 0337 11/14/22 0303 11/15/22 0430 11/16/22 1009  NA 133* 131* 133* 135 137  K 4.1 4.1 3.9 3.8 4.1  CL 103 103 104 103 110  CO2 21* 21* 21* 21* 21*  GLUCOSE 157* 104* 102* 137* 158*  BUN 53* 53* 52* 43* 30*  CREATININE 3.77* 3.59* 3.22* 2.56* 2.01*  CALCIUM 7.9* 7.8* 8.0* 8.4* 8.6*  MG  --  2.4 2.4  --   --    GFR Estimated Creatinine Clearance: 38.5 mL/min (A) (by C-G formula based on SCr of 2.01 mg/dL (H)). Liver Function Tests: Recent Labs  Lab 11/12/22 0819 11/13/22 0337 11/14/22 0303 11/15/22 0430 11/16/22 1009  AST 110* 97* 61* 59* 57*  ALT 102* 96* 63* 57* 50*  ALKPHOS 399* 341* 266* 255* 248*  BILITOT 2.6* 3.1* 3.3* 3.0* 3.7*  PROT 5.0* 5.0* 5.0* 5.3* 5.3*  ALBUMIN <1.5* <1.5* 2.2* 2.7* 2.9*   No results for input(s): "LIPASE", "AMYLASE" in the last 168 hours. No results for input(s): "AMMONIA" in the last 168 hours. Coagulation profile No results for input(s): "INR", "PROTIME" in the last 168 hours.  CBC: Recent Labs  Lab 11/12/22 0819 11/13/22 0337 11/14/22 0303 11/15/22 0430 11/16/22 1009  WBC 10.0 10.6* 7.2 6.3 5.8  NEUTROABS 7.2  --   --   --  4.0  HGB 9.3* 8.8* 7.4* 7.5* 7.5*  HCT 27.5* 25.3* 21.9* 22.2* 22.6*  MCV 97.5 96.2 97.8 99.1 100.0  PLT 80* 75* 74* 81* 81*   Cardiac Enzymes: No results for input(s): "CKTOTAL", "CKMB", "CKMBINDEX", "TROPONINI" in the last 168 hours. BNP: Invalid input(s): "POCBNP" CBG: No results for input(s): "GLUCAP" in the last 168 hours. D-Dimer No results for input(s): "DDIMER" in the last 72 hours. Hgb A1c No results for input(s): "HGBA1C" in the last 72 hours. Lipid Profile No results for input(s): "CHOL", "HDL", "LDLCALC", "TRIG", "CHOLHDL", "LDLDIRECT" in the last 72 hours. Thyroid function  studies No results for input(s): "TSH", "T4TOTAL", "T3FREE", "THYROIDAB" in the last 72 hours.  Invalid input(s): "FREET3" Anemia work up No results for input(s): "VITAMINB12", "FOLATE", "FERRITIN", "TIBC", "IRON", "RETICCTPCT" in the last 72 hours. Microbiology Recent Results (from the past 240 hour(s))  Blood Culture (routine x 2)     Status: Abnormal   Collection Time: 11/07/22 12:13 PM   Specimen: Left Antecubital; Blood  Result Value Ref Range Status   Specimen Description   Final    LEFT ANTECUBITAL BOTTLES DRAWN AEROBIC AND ANAEROBIC Performed at [facility], [address] [place], [place] [address]    Special Requests   Final    Blood Culture adequate volume Performed at [facility], [address] [place], [place] [address]    Culture  Setup Time   Final    GRAM POSITIVE COCCI Gram Stain Report Called to,Read Back By and Verified With: LOPEZ,V @ 0504 ON 11/08/22 BY JUW AEROBIC BOTTLE ONLY GS DONE @ [facility] CRITICAL RESULT CALLED TO, READ BACK BY AND VERIFIED WITH: PHARMD CATHY PIERCE [id number] AT 0846 BY EC  ANAB REVIEWED BY A. LAFRANCE Performed at [facility], [address], [place], [place] [address]    Culture ENTEROCOCCUS FAECALIS (A)  Final   Report Status 11/10/2022 FINAL  Final   Organism ID, Bacteria ENTEROCOCCUS FAECALIS  Final      Susceptibility   Enterococcus faecalis - MIC*  AMPICILLIN <=2 SENSITIVE Sensitive     VANCOMYCIN 2 SENSITIVE Sensitive     GENTAMICIN SYNERGY SENSITIVE Sensitive     * ENTEROCOCCUS FAECALIS  Blood Culture (routine x 2)     Status: Abnormal   Collection Time: 11/07/22 12:13 PM   Specimen: BLOOD LEFT HAND  Result Value Ref Range Status   Specimen Description   Final    BLOOD LEFT HAND BOTTLES DRAWN AEROBIC AND ANAEROBIC Performed at [facility], [address], [place], [place] [address]    Special Requests   Final    Blood Culture adequate volume Performed at [facility], [address] [place], [place] [address]     Culture  Setup Time   Final    GRAM POSITIVE COCCI Gram Stain Report Called to,Read Back By and Verified With: B.BARNES @ 0841 BY STEPHTR 11/08/22  REVIEWED BY A. LAFRANCE CRITICAL VALUE NOTED.  VALUE IS CONSISTENT WITH PREVIOUSLY REPORTED AND CALLED VALUE. IN BOTH AEROBIC AND ANAEROBIC BOTTLES    Culture (A)  Final    ENTEROCOCCUS FAECALIS SUSCEPTIBILITIES PERFORMED ON PREVIOUS CULTURE WITHIN THE LAST 5 DAYS. Performed at [facility], [address], [place], [place] [address]    Report Status 11/10/2022 FINAL  Final  Blood Culture ID Panel (Reflexed)     Status: Abnormal   Collection Time: 11/07/22 12:13 PM  Result Value Ref Range Status   Enterococcus faecalis DETECTED (A) NOT DETECTED Final    Comment: CRITICAL RESULT CALLED TO, READ BACK BY AND VERIFIED WITH: PHARMD CATHY PIERCE [id number] AT 0846 BY EC    Enterococcus Faecium NOT DETECTED NOT DETECTED Final   Listeria monocytogenes NOT DETECTED NOT DETECTED Final   Staphylococcus species NOT DETECTED NOT DETECTED Final   Staphylococcus aureus (BCID) NOT DETECTED NOT DETECTED Final   Staphylococcus epidermidis NOT DETECTED NOT DETECTED Final   Staphylococcus lugdunensis NOT DETECTED NOT DETECTED Final   Streptococcus species NOT DETECTED NOT DETECTED Final   Streptococcus agalactiae NOT DETECTED NOT DETECTED Final   Streptococcus pneumoniae NOT DETECTED NOT DETECTED Final   Streptococcus pyogenes NOT DETECTED NOT DETECTED Final   A.calcoaceticus-baumannii NOT DETECTED NOT DETECTED Final   Bacteroides fragilis NOT DETECTED NOT DETECTED Final   Enterobacterales NOT DETECTED NOT DETECTED Final   Enterobacter cloacae complex NOT DETECTED NOT DETECTED Final   Escherichia coli NOT DETECTED NOT DETECTED Final   Klebsiella aerogenes NOT DETECTED NOT DETECTED Final   Klebsiella oxytoca NOT DETECTED NOT DETECTED Final   Klebsiella pneumoniae NOT DETECTED NOT DETECTED Final   Proteus species NOT DETECTED NOT DETECTED Final    Salmonella species NOT DETECTED NOT DETECTED Final   Serratia marcescens NOT DETECTED NOT DETECTED Final   Haemophilus influenzae NOT DETECTED NOT DETECTED Final   Neisseria meningitidis NOT DETECTED NOT DETECTED Final   Pseudomonas aeruginosa NOT DETECTED NOT DETECTED Final   Stenotrophomonas maltophilia NOT DETECTED NOT DETECTED Final   Candida albicans NOT DETECTED NOT DETECTED Final   Candida auris NOT DETECTED NOT DETECTED Final   Candida glabrata NOT DETECTED NOT DETECTED Final   Candida krusei NOT DETECTED NOT DETECTED Final   Candida parapsilosis NOT DETECTED NOT DETECTED Final   Candida tropicalis NOT DETECTED NOT DETECTED Final   Cryptococcus neoformans/gattii NOT DETECTED NOT DETECTED Final   Vancomycin resistance NOT DETECTED NOT DETECTED Final    Comment: Performed at [facility], [address], [place], [place] [address]  Resp panel by RT-PCR (RSV, Flu A&B, Covid) Anterior Nasal Swab  Status: None   Collection Time: 11/07/22  1:00 PM   Specimen: Anterior Nasal Swab  Result Value Ref Range Status   SARS Coronavirus 2 by RT PCR NEGATIVE NEGATIVE Final    Comment: (NOTE) SARS-CoV-2 target nucleic acids are NOT DETECTED.  The SARS-CoV-2 RNA is generally detectable in upper respiratory specimens during the acute phase of infection. The lowest concentration of SARS-CoV-2 viral copies this assay can detect is 138 copies/mL. A negative result does not preclude SARS-Cov-2 infection and should not be used as the sole basis for treatment or other patient management decisions. A negative result may occur with  improper specimen collection/handling, submission of specimen other than nasopharyngeal swab, presence of viral mutation(s) within the areas targeted by this assay, and inadequate number of viral copies(<138 copies/mL). A negative result must be combined with clinical observations, patient history, and epidemiological information. The expected result is  Negative.  Fact Sheet for Patients:  BloggerCourse.com  Fact Sheet for Healthcare Providers:  SeriousBroker.it  This test is no t yet approved or cleared by the [place] FDA and  has been authorized for detection and/or diagnosis of SARS-CoV-2 by FDA under an Emergency Use Authorization (EUA). This EUA will remain  in effect (meaning this test can be used) for the duration of the COVID-19 declaration under Section 564(b)(1) of the Act, 21 U.S.C.section 360bbb-3(b)(1), unless the authorization is terminated  or revoked sooner.       Influenza A by PCR NEGATIVE NEGATIVE Final   Influenza B by PCR NEGATIVE NEGATIVE Final    Comment: (NOTE) The Xpert Xpress SARS-CoV-2/FLU/RSV plus assay is intended as an aid in the diagnosis of influenza from Nasopharyngeal swab specimens and should not be used as a sole basis for treatment. Nasal washings and aspirates are unacceptable for Xpert Xpress SARS-CoV-2/FLU/RSV testing.  Fact Sheet for Patients: BloggerCourse.com  Fact Sheet for Healthcare Providers: SeriousBroker.it  This test is not yet approved or cleared by the [place] FDA and has been authorized for detection and/or diagnosis of SARS-CoV-2 by FDA under an Emergency Use Authorization (EUA). This EUA will remain in effect (meaning this test can be used) for the duration of the COVID-19 declaration under Section 564(b)(1) of the Act, 21 U.S.C. section 360bbb-3(b)(1), unless the authorization is terminated or revoked.     Resp Syncytial Virus by PCR NEGATIVE NEGATIVE Final    Comment: (NOTE) Fact Sheet for Patients: BloggerCourse.com  Fact Sheet for Healthcare Providers: SeriousBroker.it  This test is not yet approved or cleared by the [place] FDA and has been authorized for detection and/or diagnosis of  SARS-CoV-2 by FDA under an Emergency Use Authorization (EUA). This EUA will remain in effect (meaning this test can be used) for the duration of the COVID-19 declaration under Section 564(b)(1) of the Act, 21 U.S.C. section 360bbb-3(b)(1), unless the authorization is terminated or revoked.  Performed at [facility], [address], [place], [place] [address]   Gram stain     Status: None   Collection Time: 11/08/22  1:34 PM   Specimen: PATH Cytology Peritoneal fluid  Result Value Ref Range Status   Specimen Description PERITONEAL  Final   Special Requests NONE  Final   Gram Stain   Final    RARE WBC PRESENT, PREDOMINANTLY PMN NO ORGANISMS SEEN Performed at [facility], [address], [place], [place] [address]    Report Status 11/08/2022 FINAL  Final  Culture, body fluid w Gram Stain-bottle     Status: None  Collection Time: 11/08/22  1:34 PM   Specimen: Peritoneal Washings  Result Value Ref Range Status   Specimen Description PERITONEAL  Final   Special Requests NONE  Final   Culture   Final    NO GROWTH 5 DAYS Performed at [facility], [address] [place], [place] [address]    Report Status 11/13/2022 FINAL  Final  Surgical pcr screen     Status: None   Collection Time: 11/09/22  7:09 AM   Specimen: Nasal Mucosa; Nasal Swab  Result Value Ref Range Status   MRSA, PCR NEGATIVE NEGATIVE Final   Staphylococcus aureus NEGATIVE NEGATIVE Final    Comment: (NOTE) The Xpert SA Assay (FDA approved for NASAL specimens in patients 47 years of age and older), is one component of a comprehensive surveillance program. It is not intended to diagnose infection nor to guide or monitor treatment. Performed at [facility], [address], [place], [place] [address]   Culture, blood (Routine X 2) w Reflex to ID Panel     Status: None   Collection Time: 11/09/22 10:06 AM   Specimen: BLOOD LEFT ARM  Result Value Ref Range Status   Specimen Description BLOOD  LEFT ARM  Final   Special Requests   Final    BOTTLES DRAWN AEROBIC AND ANAEROBIC Blood Culture adequate volume   Culture   Final    NO GROWTH 5 DAYS Performed at [facility], [address] [place], [place] [address]    Report Status 11/14/2022 FINAL  Final  Culture, blood (Routine X 2) w Reflex to ID Panel     Status: None   Collection Time: 11/09/22 10:10 AM   Specimen: BLOOD LEFT HAND  Result Value Ref Range Status   Specimen Description BLOOD LEFT HAND  Final   Special Requests   Final    BOTTLES DRAWN AEROBIC AND ANAEROBIC Blood Culture adequate volume   Culture   Final    NO GROWTH 5 DAYS Performed at [facility], [address] [place], [place] [address]    Report Status 11/14/2022 FINAL  Final     Discharge Instructions:   Discharge Instructions     Call MD for:  persistant nausea and vomiting   Complete by: As directed    Call MD for:  severe uncontrolled pain   Complete by: As directed    Call MD for:  temperature >100.4   Complete by: As directed    Diet - low sodium heart healthy   Complete by: As directed    Discharge instructions   Complete by: As directed    Follow-up with your primary care patient in 1 week and check blood work at that time.  Follow-up with radiology and GI as outpatient( GI and radiology to coordinate with you)  Follow-up with urology as has been scheduled for surgical intervention.  Follow-up with your oncologist as outpatient as scheduled by the clinic.  Follow-up with nephrology  in [place] as scheduled by the clinic. Seek medical attention for worsening symptoms.  Complete the course of antibiotic.   Increase activity slowly   Complete by: As directed    No wound care   Complete by: As directed       Allergies as of 11/16/2022   No Known Allergies      Medication List     STOP taking these medications    furosemide 20 MG tablet Commonly known as: LASIX   lisinopril  2.5 MG tablet Commonly known as:  ZESTRIL       TAKE these medications    acetaminophen 500 MG tablet Commonly known as: TYLENOL Take 500 mg by mouth every 6 (six) hours as needed for moderate pain or headache.   albuterol 108 (90 Base) MCG/ACT inhaler Commonly known as: VENTOLIN HFA Inhale 2 puffs into the lungs every 6 (six) hours as needed for wheezing or shortness of breath.   alfuzosin 10 MG 24 hr tablet Commonly known as: UROXATRAL Take 1 tablet (10 mg total) by mouth at bedtime.   amoxicillin 500 MG capsule Commonly known as: AMOXIL Take 2 capsules (1,000 mg total) by mouth 3 (three) times daily for 6 days.   atorvastatin 40 MG tablet Commonly known as: LIPITOR Take 1 tablet (40 mg total) by mouth daily at 6 PM.   carvedilol 3.125 MG tablet Commonly known as: COREG Take 1 tablet (3.125 mg total) by mouth 2 (two) times daily with a meal.   CISplatin 50 MG/50ML Soln Commonly known as: PLATINOL Inject 50 mg into the vein once a week. On days 1 and 8, every 21 days   durvalumab 1,500 mg in sodium chloride 0.9 % 100 mL Inject 1,500 mg into the vein every 21 ( twenty-one) days.   famotidine 20 MG tablet Commonly known as: PEPCID Take 20 mg by mouth 2 (two) times daily.   ferrous sulfate 325 (65 FE) MG tablet Commonly known as: FerrouSul Take 1 tablet (325 mg total) by mouth daily with breakfast.   gemcitabine 760 mg/m2 in sodium chloride 0.9 % 100 mL Inject 760 mg/m2 into the vein once a week. On days 1 and 8 every 21 days   guaifenesin 100 MG/5ML syrup Commonly known as: ROBITUSSIN Take 200 mg by mouth 3 (three) times daily as needed for cough.   Intrasite Gel Applipak Gel Apply 1 Units topically daily.   lidocaine 5 % Commonly known as: LIDODERM Place 1 patch onto the skin daily. Remove & Discard patch within 12 hours or as directed by MD   metFORMIN 500 MG tablet Commonly known as: GLUCOPHAGE Take 500 mg by mouth 2 (two) times daily with a meal.   Misc. Devices Kit 1 Box by Does  not apply route every 7 (seven) days.   multivitamin with minerals Tabs tablet Take 1 tablet by mouth daily. Start taking on: November 17, 2022   nitroGLYCERIN 0.4 MG SL tablet Commonly known as: Nitrostat Place 1 tablet (0.4 mg total) under the tongue every 5 (five) minutes as needed for chest pain.   ondansetron 4 MG tablet Commonly known as: ZOFRAN Take 1 tablet (4 mg total) by mouth every 6 (six) hours as needed for nausea.   pantoprazole 40 MG tablet Commonly known as: PROTONIX Take 1 tablet (40 mg total) by mouth daily.   prochlorperazine 10 MG tablet Commonly known as: COMPAZINE TAKE (1) TABLET BY MOUTH EVERY (6) HOURS AS NEEDED. What changed: See the new instructions.   sodium chloride flush 0.9 % Soln Commonly known as: NS 10 mLs by Intracatheter route every 7 (seven) days. What changed: when to take this        Follow-up Information     Elfredia Nevins, MD Follow up in 1 week(s).   Specialty: Internal Medicine Contact information: [address] [place] [address] [phone]                  Time coordinating discharge: 39 minutes  Signed:  Rebekah Chesterfield Wali Reinheimer  [facility] 11/16/2022, 11:33 AM

## 2022-11-16 NOTE — Telephone Encounter (Signed)
Called to schedule biliary stent placement. It was decided that there is not an urgent need for the stent and pt will f/u with Dr. Leonides Schanz. Will call back if this needs to be scheduled. Pt's wife said that the PA came up and spoke with them this morning. AB

## 2022-11-17 ENCOUNTER — Encounter (HOSPITAL_COMMUNITY)
Admission: RE | Admit: 2022-11-17 | Discharge: 2022-11-17 | Disposition: A | Discharge status: Home / Self Care | Payer: Medicare Other | Source: Ambulatory Visit | Attending: Urology | Admitting: Urology

## 2022-11-17 ENCOUNTER — Encounter (HOSPITAL_COMMUNITY): Payer: Self-pay

## 2022-11-17 NOTE — Pre-Procedure Instructions (Signed)
Dr Ronne Binning and Dr Johnnette Litter have reviewed chart and want to proceed with surgery. No new orders given.

## 2022-11-18 ENCOUNTER — Ambulatory Visit (HOSPITAL_COMMUNITY)
Admission: RE | Admit: 2022-11-18 | Discharge: 2022-11-18 | Disposition: A | Discharge status: Home / Self Care | Payer: Medicare Other | Source: Ambulatory Visit | Attending: Hematology | Admitting: Hematology

## 2022-11-18 ENCOUNTER — Telehealth (HOSPITAL_COMMUNITY): Payer: Self-pay

## 2022-11-18 ENCOUNTER — Encounter (HOSPITAL_COMMUNITY): Payer: Self-pay

## 2022-11-18 ENCOUNTER — Telehealth: Payer: Self-pay | Admitting: *Deleted

## 2022-11-18 ENCOUNTER — Telehealth: Payer: Self-pay

## 2022-11-18 ENCOUNTER — Other Ambulatory Visit: Payer: Self-pay | Admitting: *Deleted

## 2022-11-18 DIAGNOSIS — C221 Intrahepatic bile duct carcinoma: Secondary | ICD-10-CM | POA: Diagnosis present

## 2022-11-18 DIAGNOSIS — C249 Malignant neoplasm of biliary tract, unspecified: Secondary | ICD-10-CM

## 2022-11-18 NOTE — Progress Notes (Signed)
Patient tolerated left sided paracentesis procedure well today and 5.5 Liters of cloudy yellow ascites removed. Patient verbalized understanding of discharge instructions and left via wheelchair with wife with no acute distress noted.

## 2022-11-18 NOTE — Telephone Encounter (Signed)
-----   Message from Bennie Dallas, MD sent at 11/16/2022 12:56 PM EDT ----- Please hold on this.  No need to schedule anything at this time.  We'll let you know if this changes.  Thanks, Dylan ----- Message ----- From: Sharee Pimple Sent: 11/16/2022  12:08 PM EDT To: Hoyt Koch, PA; #  I can do Friday at 11am.   ----- Message ----- From: Hoyt Koch, PA Sent: 11/13/2022  12:53 PM EDT To: Bennie Dallas, MD; Shirlyn Goltz; #  Hi ladies,   Could one of you confirm that this patient-- Jonathan Keith-- could be scheduled next week for a biliary drain placement at either [facility] or [facility] with any IR MD?  His potential discharge may hinge on our ability to get him scheduled within the week for the drain placement.  Please note he is scheduled for lithotripsy 4/9 so potentially Wed-Friday to avoid conflict with that procedure?  Lannette Donath

## 2022-11-18 NOTE — Telephone Encounter (Signed)
Patient has recently been discharged from hospital.  Per wife, patient has gained 4 pounds of fluid and is extremely distended.  He has a lithotripsy scheduled for tomorrow.  Per Dr. Ellin Saba, patient to have STAT paracentesis today and has been scheduled.  Patient aware.

## 2022-11-18 NOTE — Telephone Encounter (Signed)
Return call to Patient's wife Jonathan Keith. Patient's wife Jonathan Keith is concern about patient having surgery and voiced that the patient may have fluid buildup in his abdomen. Patient's wife Jonathan Keith voiced that patient was in the hospital and he is weak but would like to proceed with surgery. Patient's wife is aware that patient can proceed with surgery. Jonathan Keith voiced understanding

## 2022-11-18 NOTE — Procedures (Signed)
   Hx Cholangiocarcinoma Recurrent ascites  Request for paracentesis per Oncology Dr Ellin Saba No limit per orders  US guided LLQ paracentesis 5.5 L cloudy yellow fluid No labs per MD  Tolerated well  EBL:  scant

## 2022-11-19 ENCOUNTER — Ambulatory Visit (HOSPITAL_COMMUNITY)
Admission: RE | Admit: 2022-11-19 | Discharge: 2022-11-19 | Disposition: A | Discharge status: Home / Self Care | Payer: Medicare Other | Attending: Urology | Admitting: Urology

## 2022-11-19 ENCOUNTER — Ambulatory Visit (HOSPITAL_COMMUNITY): Payer: Medicare Other | Admitting: Anesthesiology

## 2022-11-19 ENCOUNTER — Encounter (HOSPITAL_COMMUNITY)
Admission: RE | Disposition: A | Discharge status: Home / Self Care | Payer: Self-pay | Source: Home / Self Care | Attending: Urology

## 2022-11-19 ENCOUNTER — Inpatient Hospital Stay: Payer: Medicare Other

## 2022-11-19 ENCOUNTER — Encounter (HOSPITAL_COMMUNITY): Payer: Self-pay | Admitting: Urology

## 2022-11-19 ENCOUNTER — Ambulatory Visit (HOSPITAL_BASED_OUTPATIENT_CLINIC_OR_DEPARTMENT_OTHER): Payer: Medicare Other | Admitting: Anesthesiology

## 2022-11-19 DIAGNOSIS — F17210 Nicotine dependence, cigarettes, uncomplicated: Secondary | ICD-10-CM | POA: Insufficient documentation

## 2022-11-19 DIAGNOSIS — N401 Enlarged prostate with lower urinary tract symptoms: Secondary | ICD-10-CM | POA: Diagnosis not present

## 2022-11-19 DIAGNOSIS — I252 Old myocardial infarction: Secondary | ICD-10-CM | POA: Insufficient documentation

## 2022-11-19 DIAGNOSIS — R3916 Straining to void: Secondary | ICD-10-CM | POA: Insufficient documentation

## 2022-11-19 DIAGNOSIS — I110 Hypertensive heart disease with heart failure: Secondary | ICD-10-CM | POA: Diagnosis not present

## 2022-11-19 DIAGNOSIS — I509 Heart failure, unspecified: Secondary | ICD-10-CM | POA: Insufficient documentation

## 2022-11-19 DIAGNOSIS — E1151 Type 2 diabetes mellitus with diabetic peripheral angiopathy without gangrene: Secondary | ICD-10-CM | POA: Diagnosis not present

## 2022-11-19 DIAGNOSIS — N319 Neuromuscular dysfunction of bladder, unspecified: Secondary | ICD-10-CM

## 2022-11-19 DIAGNOSIS — N210 Calculus in bladder: Secondary | ICD-10-CM

## 2022-11-19 DIAGNOSIS — Z79899 Other long term (current) drug therapy: Secondary | ICD-10-CM | POA: Diagnosis not present

## 2022-11-19 DIAGNOSIS — D649 Anemia, unspecified: Secondary | ICD-10-CM | POA: Diagnosis not present

## 2022-11-19 DIAGNOSIS — D759 Disease of blood and blood-forming organs, unspecified: Secondary | ICD-10-CM | POA: Diagnosis not present

## 2022-11-19 DIAGNOSIS — R3912 Poor urinary stream: Secondary | ICD-10-CM | POA: Insufficient documentation

## 2022-11-19 HISTORY — PX: CYSTOSCOPY WITH LITHOLAPAXY: SHX1425

## 2022-11-19 HISTORY — PX: HOLMIUM LASER APPLICATION: SHX5852

## 2022-11-19 LAB — GLUCOSE, CAPILLARY: Glucose-Capillary: 93 mg/dL (ref 70–99)

## 2022-11-19 SURGERY — CYSTOSCOPY, WITH BLADDER CALCULUS LITHOLAPAXY
Anesthesia: General

## 2022-11-19 MED ORDER — ONDANSETRON HCL 4 MG/2ML IJ SOLN
INTRAMUSCULAR | Status: AC
  Filled 2022-11-19: qty 2

## 2022-11-19 MED ORDER — SODIUM CHLORIDE 0.9 % IR SOLN
Status: DC | PRN
  Administered 2022-11-19: 3000 mL

## 2022-11-19 MED ORDER — PHENYLEPHRINE HCL (PRESSORS) 10 MG/ML IV SOLN
INTRAVENOUS | Status: DC | PRN
  Administered 2022-11-19: 160 ug via INTRAVENOUS
  Administered 2022-11-19 (×4): 240 ug via INTRAVENOUS

## 2022-11-19 MED ORDER — MIDAZOLAM HCL 2 MG/2ML IJ SOLN
INTRAMUSCULAR | Status: AC
  Filled 2022-11-19: qty 2

## 2022-11-19 MED ORDER — PROPOFOL 10 MG/ML IV BOLUS
INTRAVENOUS | Status: DC | PRN
  Administered 2022-11-19: 100 mg via INTRAVENOUS

## 2022-11-19 MED ORDER — WATER FOR IRRIGATION, STERILE IR SOLN
Status: DC | PRN
  Administered 2022-11-19: 1000 mL

## 2022-11-19 MED ORDER — FENTANYL CITRATE (PF) 100 MCG/2ML IJ SOLN
INTRAMUSCULAR | Status: DC | PRN
  Administered 2022-11-19: 50 ug via INTRAVENOUS

## 2022-11-19 MED ORDER — ORAL CARE MOUTH RINSE: 15.0000 mL | Freq: Once | OROMUCOSAL | Status: DC

## 2022-11-19 MED ORDER — DEXAMETHASONE SODIUM PHOSPHATE 10 MG/ML IJ SOLN
INTRAMUSCULAR | Status: DC | PRN
  Administered 2022-11-19: 4 mg via INTRAVENOUS

## 2022-11-19 MED ORDER — CHLORHEXIDINE GLUCONATE 0.12 % MT SOLN: 15.0000 mL | Freq: Once | OROMUCOSAL | Status: DC

## 2022-11-19 MED ORDER — ONDANSETRON HCL 4 MG/2ML IJ SOLN
INTRAMUSCULAR | Status: DC | PRN
  Administered 2022-11-19: 4 mg via INTRAVENOUS

## 2022-11-19 MED ORDER — PHENYLEPHRINE 80 MCG/ML (10ML) SYRINGE FOR IV PUSH (FOR BLOOD PRESSURE SUPPORT)
PREFILLED_SYRINGE | INTRAVENOUS | Status: AC
  Filled 2022-11-19: qty 20

## 2022-11-19 MED ORDER — CHLORHEXIDINE GLUCONATE 0.12 % MT SOLN
OROMUCOSAL | Status: AC
  Filled 2022-11-19: qty 15

## 2022-11-19 MED ORDER — FENTANYL CITRATE (PF) 100 MCG/2ML IJ SOLN
INTRAMUSCULAR | Status: AC
  Filled 2022-11-19: qty 2

## 2022-11-19 MED ORDER — LACTATED RINGERS IV SOLN: INTRAVENOUS | Status: DC | PRN

## 2022-11-19 MED ORDER — PROPOFOL 10 MG/ML IV BOLUS
INTRAVENOUS | Status: AC
  Filled 2022-11-19: qty 20

## 2022-11-19 MED ORDER — LIDOCAINE HCL (PF) 2 % IJ SOLN
INTRAMUSCULAR | Status: AC
  Filled 2022-11-19: qty 5

## 2022-11-19 MED ORDER — LIDOCAINE HCL (CARDIAC) PF 100 MG/5ML IV SOSY
PREFILLED_SYRINGE | INTRAVENOUS | Status: DC | PRN
  Administered 2022-11-19: 100 mg via INTRAVENOUS

## 2022-11-19 MED ORDER — DEXAMETHASONE SODIUM PHOSPHATE 10 MG/ML IJ SOLN
INTRAMUSCULAR | Status: AC
  Filled 2022-11-19: qty 1

## 2022-11-19 MED ORDER — MIDAZOLAM HCL 5 MG/5ML IJ SOLN
INTRAMUSCULAR | Status: DC | PRN
  Administered 2022-11-19 (×2): 1 mg via INTRAVENOUS

## 2022-11-19 MED ORDER — LACTATED RINGERS IV SOLN: INTRAVENOUS | Status: DC

## 2022-11-19 MED ORDER — OXYCODONE-ACETAMINOPHEN 5-325 MG PO TABS: 1.0000 | ORAL_TABLET | ORAL | 0 refills | Status: DC | PRN

## 2022-11-19 MED ORDER — CEFAZOLIN SODIUM-DEXTROSE 2-4 GM/100ML-% IV SOLN
2.0000 g | INTRAVENOUS | Status: AC
  Administered 2022-11-19: 2 g via INTRAVENOUS
  Filled 2022-11-19: qty 100

## 2022-11-19 SURGICAL SUPPLY — 19 items
BAG DRAIN URO TABLE W/ADPT NS (BAG) ×1 IMPLANT
BAG DRN 8 ADPR NS SKTRN CSTL (BAG) ×1
BAG DRN RND TRDRP ANRFLXCHMBR (UROLOGICAL SUPPLIES) ×1
BAG HAMPER (MISCELLANEOUS) ×1 IMPLANT
BAG URINE DRAIN 2000ML AR STRL (UROLOGICAL SUPPLIES) ×1 IMPLANT
CATH FOLEY 2WAY SLVR  5CC 18FR (CATHETERS) ×1
CATH FOLEY 2WAY SLVR 5CC 18FR (CATHETERS) ×1 IMPLANT
GLOVE BIO SURGEON STRL SZ8 (GLOVE) ×1 IMPLANT
GLOVE BIOGEL PI IND STRL 7.0 (GLOVE) ×2 IMPLANT
GOWN STRL REUS W/TWL LRG LVL3 (GOWN DISPOSABLE) ×1 IMPLANT
GOWN STRL REUS W/TWL XL LVL3 (GOWN DISPOSABLE) ×1 IMPLANT
IV NS IRRIG 3000ML ARTHROMATIC (IV SOLUTION) ×1 IMPLANT
KIT TURNOVER CYSTO (KITS) ×1 IMPLANT
LASER FIBER DISP 1000U (UROLOGICAL SUPPLIES) IMPLANT
PACK CYSTO (CUSTOM PROCEDURE TRAY) ×1 IMPLANT
PAD ARMBOARD 7.5X6 YLW CONV (MISCELLANEOUS) ×1 IMPLANT
TRACTIP FLEXIVA PULS ID 200XHI (Laser) IMPLANT
TRACTIP FLEXIVA PULSE ID 200 (Laser) ×1
WATER STERILE IRR 500ML POUR (IV SOLUTION) ×1 IMPLANT

## 2022-11-19 NOTE — Op Note (Signed)
Preoperative diagnosis: neurogenic bladder, bladder calculus  Postoperative diagnosis: same  Procedure: 1 cystoscopy 2. cystolithalopaxy for a stone less than 2.5cm  Attending: Wilkie Aye  Anesthesia: General  Estimated blood loss: Minimal  Drains: 18 french foley  Specimens: 1. Bladder calculus  Antibiotics: ancef  Findings:  Ureteral orifices in normal anatomic location.  1.5cm bladder calculus  Indications: Patient is a 67 year old male with a history of BPH and bladder calculus.  After discussing treatment options, they decided proceed with cystolithalopaxy.  Procedure in detail: The patient was brought to the operating room and a brief timeout was done to ensure correct patient, correct procedure, correct site.  General anesthesia was administered patient was placed in dorsal lithotomy position.  Their genitalia was then prepped and draped in usual sterile fashion.  A rigid 22 French cystoscope was passed in the urethra and the bladder.  Bladder was inspected and we noted no masses or lesions.  the ureteral orifices were in the normal orthotopic locations. Using the 1000nm laser fiber the bladder calculus was fragmented and the fragments were then irrigated from the bladder. The stone fragments were the sent for analysis. The bladder was then drained and this concluded the procedure which was well tolerated by patient.  Complications: None  Condition: Stable, extubated, transferred to PACU  Plan: Patient is to be discharge home. He will followup in 1 week for a voiding trial

## 2022-11-19 NOTE — Anesthesia Procedure Notes (Signed)
Procedure Name: LMA Insertion Date/Time: 11/19/2022 9:20 AM  Performed by: Shanon Payor, CRNAPre-anesthesia Checklist: Patient identified, Emergency Drugs available, Suction available, Patient being monitored and Timeout performed Patient Re-evaluated:Patient Re-evaluated prior to induction Oxygen Delivery Method: Circle system utilized Preoxygenation: Pre-oxygenation with 100% oxygen Induction Type: IV induction LMA: LMA inserted LMA Size: 5.0 Number of attempts: 1 Placement Confirmation: positive ETCO2, CO2 detector and breath sounds checked- equal and bilateral Tube secured with: Tape Dental Injury: Teeth and Oropharynx as per pre-operative assessment

## 2022-11-19 NOTE — Interval H&P Note (Signed)
History and Physical Interval Note:  11/19/2022 8:51 AM  Jonathan Keith  has presented today for surgery, with the diagnosis of bladder calculus.  The various methods of treatment have been discussed with the patient and family. After consideration of risks, benefits and other options for treatment, the patient has consented to  Procedure(s): CYSTOSCOPY WITH LITHOLAPAXY (N/A) HOLMIUM LASER APPLICATION (N/A) as a surgical intervention.  The patient's history has been reviewed, patient examined, no change in status, stable for surgery.  I have reviewed the patient's chart and labs.  Questions were answered to the patient's satisfaction.     Wilkie Aye

## 2022-11-19 NOTE — Transfer of Care (Signed)
Immediate Anesthesia Transfer of Care Note  Patient: Jonathan Keith  Procedure(s) Performed: CYSTOSCOPY WITH LITHOLAPAXY HOLMIUM LASER APPLICATION  Patient Location: PACU  Anesthesia Type:General  Level of Consciousness: awake, drowsy, and patient cooperative  Airway & Oxygen Therapy: Patient Spontanous Breathing and Patient connected to face mask oxygen  Post-op Assessment: Report given to RN, Post -op Vital signs reviewed and stable, and Patient moving all extremities X 4  Post vital signs: Reviewed and stable  Last Vitals:  Vitals Value Taken Time  BP    Temp    Pulse    Resp    SpO2      Last Pain:  Vitals:   11/19/22 0824  TempSrc: Oral  PainSc: 0-No pain      Patients Stated Pain Goal: 6 (11/19/22 0824)  Complications: No notable events documented.

## 2022-11-19 NOTE — Anesthesia Preprocedure Evaluation (Signed)
Anesthesia Evaluation  Patient identified by MRN, date of birth, ID band Patient awake    Reviewed: Allergy & Precautions, H&P , NPO status , Patient's Chart, lab work & pertinent test results, reviewed documented beta blocker date and time   Airway Mallampati: II  TM Distance: >3 FB Neck ROM: full    Dental no notable dental hx.    Pulmonary neg pulmonary ROS, pneumonia, Current Smoker and Patient abstained from smoking.   Pulmonary exam normal breath sounds clear to auscultation       Cardiovascular Exercise Tolerance: Good hypertension, + CAD, + Past MI, + Peripheral Vascular Disease and +CHF  negative cardio ROS  Rhythm:regular Rate:Normal     Neuro/Psych negative neurological ROS  negative psych ROS   GI/Hepatic negative GI ROS, Neg liver ROS,GERD  ,,  Endo/Other  negative endocrine ROSdiabetes    Renal/GU Renal diseasenegative Renal ROS  negative genitourinary   Musculoskeletal   Abdominal   Peds  Hematology negative hematology ROS (+) Blood dyscrasia, anemia   Anesthesia Other Findings   Reproductive/Obstetrics negative OB ROS                             Anesthesia Physical Anesthesia Plan  ASA: 2  Anesthesia Plan: General   Post-op Pain Management:    Induction:   PONV Risk Score and Plan: Propofol infusion  Airway Management Planned:   Additional Equipment:   Intra-op Plan:   Post-operative Plan:   Informed Consent: I have reviewed the patients History and Physical, chart, labs and discussed the procedure including the risks, benefits and alternatives for the proposed anesthesia with the patient or authorized representative who has indicated his/her understanding and acceptance.     Dental Advisory Given  Plan Discussed with: CRNA  Anesthesia Plan Comments:        Anesthesia Quick Evaluation

## 2022-11-19 NOTE — Progress Notes (Signed)
Foley care explained to patients wife.  Gave patient a leg bag and explained how to put on.  Encouraged glove use and proper hand hygiene.  Encouraged using leg bag as a last resort due to risk of infection with switching between bags.  Wife informed to call office for 1 week appointment for voiding trial.

## 2022-11-20 ENCOUNTER — Ambulatory Visit (INDEPENDENT_AMBULATORY_CARE_PROVIDER_SITE_OTHER): Payer: Medicare Other | Admitting: Urology

## 2022-11-20 DIAGNOSIS — R339 Retention of urine, unspecified: Secondary | ICD-10-CM

## 2022-11-20 NOTE — Anesthesia Postprocedure Evaluation (Signed)
Anesthesia Post Note  Patient: Jonathan Keith  Procedure(s) Performed: CYSTOSCOPY WITH LITHOLAPAXY HOLMIUM LASER APPLICATION  Patient location during evaluation: Phase II Anesthesia Type: General Level of consciousness: awake Pain management: pain level controlled Vital Signs Assessment: post-procedure vital signs reviewed and stable Respiratory status: spontaneous breathing and respiratory function stable Cardiovascular status: blood pressure returned to baseline and stable Postop Assessment: no headache and no apparent nausea or vomiting Anesthetic complications: no Comments: Late entry   No notable events documented.   Last Vitals:  Vitals:   11/19/22 1025 11/19/22 1030  BP:  (!) 97/59  Pulse: 83 82  Resp: 12 16  Temp:  36.6 C  SpO2: 99% 97%    Last Pain:  Vitals:   11/19/22 1029  TempSrc:   PainSc: 0-No pain                 Windell Norfolk

## 2022-11-20 NOTE — Progress Notes (Signed)
Bladder Irrigation  Due to urinary retention patient is present today for a bladder irrigation. Patient was cleaned and prepped in a sterile fashion. 50 mL of saline/sterile water was instilled into the bladder with a 56mL [place]y syringe through the catheter in place.  50 mL of urine return was cleared from the bladder. Upon completion, the catheter was draining well and was reattached to the leg bag for drainage. Patient tolerated well. Catheter bag was leaking upon visit and new catheter bag placed  Performed by: Kennyth Lose, CMA  Additional notes/ Follow up: Keep scheduled appt.

## 2022-11-23 ENCOUNTER — Inpatient Hospital Stay: Payer: Medicare Other | Attending: Hematology

## 2022-11-23 VITALS — BP 92/63 | HR 95 | Temp 97.9°F | Resp 19

## 2022-11-23 DIAGNOSIS — R42 Dizziness and giddiness: Secondary | ICD-10-CM | POA: Diagnosis not present

## 2022-11-23 DIAGNOSIS — R7881 Bacteremia: Secondary | ICD-10-CM | POA: Insufficient documentation

## 2022-11-23 DIAGNOSIS — I959 Hypotension, unspecified: Secondary | ICD-10-CM | POA: Diagnosis not present

## 2022-11-23 DIAGNOSIS — I700 Atherosclerosis of aorta: Secondary | ICD-10-CM | POA: Diagnosis not present

## 2022-11-23 DIAGNOSIS — M47816 Spondylosis without myelopathy or radiculopathy, lumbar region: Secondary | ICD-10-CM | POA: Insufficient documentation

## 2022-11-23 DIAGNOSIS — C221 Intrahepatic bile duct carcinoma: Secondary | ICD-10-CM | POA: Diagnosis present

## 2022-11-23 DIAGNOSIS — I2510 Atherosclerotic heart disease of native coronary artery without angina pectoris: Secondary | ICD-10-CM | POA: Insufficient documentation

## 2022-11-23 DIAGNOSIS — K8021 Calculus of gallbladder without cholecystitis with obstruction: Secondary | ICD-10-CM | POA: Insufficient documentation

## 2022-11-23 DIAGNOSIS — K5730 Diverticulosis of large intestine without perforation or abscess without bleeding: Secondary | ICD-10-CM | POA: Insufficient documentation

## 2022-11-23 DIAGNOSIS — Z5986 Financial insecurity: Secondary | ICD-10-CM | POA: Diagnosis not present

## 2022-11-23 DIAGNOSIS — F17210 Nicotine dependence, cigarettes, uncomplicated: Secondary | ICD-10-CM | POA: Diagnosis not present

## 2022-11-23 DIAGNOSIS — R531 Weakness: Secondary | ICD-10-CM | POA: Insufficient documentation

## 2022-11-23 DIAGNOSIS — R180 Malignant ascites: Secondary | ICD-10-CM | POA: Diagnosis not present

## 2022-11-23 DIAGNOSIS — I509 Heart failure, unspecified: Secondary | ICD-10-CM | POA: Diagnosis not present

## 2022-11-23 DIAGNOSIS — N210 Calculus in bladder: Secondary | ICD-10-CM | POA: Diagnosis not present

## 2022-11-23 DIAGNOSIS — Z79899 Other long term (current) drug therapy: Secondary | ICD-10-CM | POA: Diagnosis not present

## 2022-11-23 DIAGNOSIS — M47814 Spondylosis without myelopathy or radiculopathy, thoracic region: Secondary | ICD-10-CM | POA: Diagnosis not present

## 2022-11-23 MED ORDER — SODIUM CHLORIDE 0.9% FLUSH
10.0000 mL | Freq: Once | INTRAVENOUS | Status: AC
  Administered 2022-11-23: 10 mL via INTRAVENOUS

## 2022-11-23 MED ORDER — HEPARIN SOD (PORK) LOCK FLUSH 100 UNIT/ML IV SOLN
500.0000 [IU] | Freq: Once | INTRAVENOUS | Status: AC
  Administered 2022-11-23: 500 [IU] via INTRAVENOUS

## 2022-11-23 NOTE — Progress Notes (Signed)
Jonathan Keith presented for PICC line flush. PICC line located right upper arm . Good blood return present. PICC line flushed with 65ml NS and 250U/Heparin. Procedure without incident. Patient tolerated procedure well.   Dressing changed per orders. See MAR for details.   Vitals stable and discharged home from clinic via wheelchair.  No complaints voiced and no s/s of distress noted.

## 2022-11-23 NOTE — Patient Instructions (Signed)
[facility]  Discharge Instructions: Thank you for choosing [facility] to provide your oncology and hematology care.  If you have a lab appointment with the Cancer Center - please note that after April 8th, 2024, all labs will be drawn in the cancer center.  You do not have to check in or register with the main entrance as you have in the past but will complete your check-in in the cancer center.  Wear comfortable clothing and clothing appropriate for easy access to any Portacath or PICC line.   We strive to give you quality time with your provider. You may need to reschedule your appointment if you arrive late (15 or more minutes).  Arriving late affects you and other patients whose appointments are after yours.  Also, if you miss three or more appointments without notifying the office, you may be dismissed from the clinic at the provider's discretion.      For prescription refill requests, have your pharmacy contact our office and allow 72 hours for refills to be completed.  To help prevent nausea and vomiting after your treatment, we encourage you to take your nausea medication as directed.  BELOW ARE SYMPTOMS THAT SHOULD BE REPORTED IMMEDIATELY: *FEVER GREATER THAN 100.4 F (38 C) OR HIGHER *CHILLS OR SWEATING *NAUSEA AND VOMITING THAT IS NOT CONTROLLED WITH YOUR NAUSEA MEDICATION *UNUSUAL SHORTNESS OF BREATH *UNUSUAL BRUISING OR BLEEDING *URINARY PROBLEMS (pain or burning when urinating, or frequent urination) *BOWEL PROBLEMS (unusual diarrhea, constipation, pain near the anus) TENDERNESS IN MOUTH AND THROAT WITH OR WITHOUT PRESENCE OF ULCERS (sore throat, sores in mouth, or a toothache) UNUSUAL RASH, SWELLING OR PAIN  UNUSUAL VAGINAL DISCHARGE OR ITCHING   Items with * indicate a potential emergency and should be followed up as soon as possible or go to the Emergency Department if any problems should occur.  Please show the CHEMOTHERAPY ALERT CARD or  IMMUNOTHERAPY ALERT CARD at check-in to the Emergency Department and triage nurse.  Should you have questions after your visit or need to cancel or reschedule your appointment, please contact [facility] [phone]  and follow the prompts.  Office hours are 8:00 a.m. to 4:30 p.m. Monday - Friday. Please note that voicemails left after 4:00 p.m. may not be returned until the following business day.  We are closed weekends and major holidays. You have access to a nurse at all times for urgent questions. Please call the main number to the clinic [phone] and follow the prompts.  For any non-urgent questions, you may also contact your provider using MyChart. We now offer e-Visits for anyone 18 and older to request care online for non-urgent symptoms. For details visit [email or web address].   Also download the MyChart app! Go to the app store, search "MyChart", open the app, select [facility], and log in with your MyChart username and password.   

## 2022-11-24 ENCOUNTER — Ambulatory Visit (HOSPITAL_COMMUNITY)
Admission: RE | Admit: 2022-11-24 | Discharge: 2022-11-24 | Disposition: A | Discharge status: Home / Self Care | Payer: Medicare Other | Source: Ambulatory Visit | Attending: Hematology | Admitting: Hematology

## 2022-11-24 ENCOUNTER — Other Ambulatory Visit: Payer: Self-pay | Admitting: *Deleted

## 2022-11-24 ENCOUNTER — Ambulatory Visit (INDEPENDENT_AMBULATORY_CARE_PROVIDER_SITE_OTHER): Payer: Medicare Other | Admitting: Urology

## 2022-11-24 ENCOUNTER — Encounter (HOSPITAL_COMMUNITY): Payer: Self-pay

## 2022-11-24 DIAGNOSIS — R180 Malignant ascites: Secondary | ICD-10-CM

## 2022-11-24 DIAGNOSIS — C221 Intrahepatic bile duct carcinoma: Secondary | ICD-10-CM | POA: Diagnosis present

## 2022-11-24 DIAGNOSIS — R339 Retention of urine, unspecified: Secondary | ICD-10-CM | POA: Diagnosis not present

## 2022-11-24 NOTE — Procedures (Signed)
PROCEDURE SUMMARY:  Successful image-guided paracentesis from the left lower abdomen.  Yielded 5.5 liters of clear yellow fluid.  No immediate complications.  EBL = trace. Patient tolerated well.   Specimen was not sent for labs.  Please see imaging section of Epic for full dictation.   Kennieth Francois PA-C 11/24/2022 12:30 PM

## 2022-11-24 NOTE — Progress Notes (Addendum)
Patient present in office today with concerns about foley catheter not draining properly.  Foley catheter flushed with 60 cc's of sterile water without difficulty. Return noted at 60 cc's.  Wife states that patient has paracentesis and just had fluid removed last week. She will contact radiology when leaving the office. A new leg bag applied. Foley catheter noted to be draining without difficulty.

## 2022-11-24 NOTE — Progress Notes (Signed)
Patient tolerated left sided paracentesis procedure well today and 5.5 Liters of cloudy yellow ascites removed. Patient verbalized understanding of discharge instructions and left via wheelchair with wife with no acute distress noted.  

## 2022-11-26 ENCOUNTER — Ambulatory Visit (HOSPITAL_COMMUNITY): Admission: RE | Admit: 2022-11-26 | Payer: Medicare Other | Source: Ambulatory Visit

## 2022-11-26 ENCOUNTER — Encounter (HOSPITAL_COMMUNITY): Payer: Self-pay | Admitting: Urology

## 2022-11-26 LAB — CALCULI, WITH PHOTOGRAPH (CLINICAL LAB)
Calcium Oxalate Dihydrate: 20 %
Calcium Oxalate Monohydrate: 80 %
Weight Calculi: 256 mg

## 2022-11-27 ENCOUNTER — Other Ambulatory Visit: Payer: Self-pay | Admitting: *Deleted

## 2022-11-27 ENCOUNTER — Encounter: Payer: Self-pay | Admitting: *Deleted

## 2022-11-27 DIAGNOSIS — C221 Intrahepatic bile duct carcinoma: Secondary | ICD-10-CM

## 2022-11-27 DIAGNOSIS — Z95828 Presence of other vascular implants and grafts: Secondary | ICD-10-CM

## 2022-11-27 DIAGNOSIS — R180 Malignant ascites: Secondary | ICD-10-CM

## 2022-11-27 MED ORDER — MIDODRINE HCL 5 MG PO TABS: 5.0000 mg | ORAL_TABLET | Freq: Three times a day (TID) | ORAL | 3 refills | Status: DC

## 2022-11-27 MED ORDER — PROCHLORPERAZINE MALEATE 10 MG PO TABS
10.0000 mg | ORAL_TABLET | Freq: Four times a day (QID) | ORAL | 2 refills | Status: DC | PRN
Start: 2022-11-27 — End: 2022-12-28

## 2022-11-27 NOTE — Progress Notes (Signed)
Dr. Ellin Saba aware of patients low blood pressure.  He wants patient to resume his midodrine.  I will refill per last dosing instructions.    Patient's wife is aware.  She also asks for a refill on nausea medication.  Will send that as well. She is aware to call us back next week if these medications don't work.

## 2022-11-27 NOTE — Progress Notes (Signed)
Orders placed for radiology to give Albumin with paracentesis per Dr. Ellin Saba.

## 2022-11-27 NOTE — Progress Notes (Signed)
Patient's wife called stating that he had a paracentesis two days ago where they removed 5.5 L he is now hypotensive. She reports his BP is low once he stands and that he is dizzy.   Wife was advised for him to practice safe movements when changing positions. Not to stand quickly. Advised his wife if she notices any change in level of consciousness to take him to the ER immediately for evaluation. Patient's wife is agreeable with plan and patient already has appointment scheduled for Monday 11/30/2022 @ 8:00 am. Patient will keep follow up with Dr. Ellin Saba at that time.

## 2022-11-29 NOTE — Progress Notes (Signed)
[facility] [address] [place] [address]    Clinic Day:  11/30/2022  Referring physician: Elfredia Nevins, MD  Patient Care Team: Elfredia Nevins, MD as PCP - General (Internal Medicine) Mallipeddi, Orion Modest, MD as PCP - Cardiology (Cardiology) Doreatha Massed, MD as Medical Oncologist (Medical Oncology)   ASSESSMENT & PLAN:   Assessment: 1. Metastatic cholangiocarcinoma: - 02/2021 initial diagnosis presentation with painless jaundice - ERCP with sphincterotomy, subsequent ERCP with plastic biliary stent placement. - MRI-2 cm mass involving the common hepatic duct with upstream biliary dilatation. - 03/2021 consult at [facility] with GI and surgery-confirm diagnosis of perihilar cholangiocarcinoma - 03/21/2021-biopsy/pathology, ERCP-common hepatic duct cytology and brushings performed.  EUS-1 enlarged lymph node versus tumor implant in the gastrohepatic ligament-negative for malignancy.  Single biliary plastic stent removed and replaced with the plastic stent in the right and left ducts. - 04/17/2021-right portal vein embolization to promote left hepatic hypertrophy. - 07/02/2021-diagnostic laparoscopy with multiple areas concerning for metastatic blocks identified in the abdomen and peritoneal surfaces.  6 separate biopsies obtained. - Pathology consistent with metastatic carcinoma. - 37 pound weight loss in the last 5 months. - Cycle 1 of gemcitabine, cisplatin, durvalumab on 07/22/2021. - ERCP (10/23/2022): Prior sphincterotomy open.  Single severe biliary stricture found in the left main hepatic duct, malignant appearing.  Stricture successfully dilated and a plastic biliary stent was placed into the left hepatic duct. - CARIS Assure: T p53 pathogenic variant.  TMB-low, MSI-not detected  2.  Social history/family: - Lives at home with his wife.  He worked as a Psychologist, forensic. - Current active smoker, 1 pack/day since age 51. - Maternal grandmother  had cancer.  Maternal uncle had brain tumor.   Plan: 1. Metastatic cholangiocarcinoma: - We held treatment at last visit due to hyperbilirubinemia. - He was hospitalized and had ERCP done on 11/09/2022 which showed stricture in the common hepatic duct and left hepatic duct and a partially occluded stent which was removed. - He is still feeling weak.  He is eating less due to nausea.  He is drinking Carnation instant breakfast and boost. - Reviewed lab today: Creatinine 2.98, potassium 6.4.  LFTs are also slightly worse.  Total bilirubin is 3.7.  CBC shows leukocytosis. - Hold his treatment today.  I will send him to the ER for correction of hyperkalemia. - As his bilirubin is still staying high, he needs evaluation by IR for PTCA.  2.  Abdominal distention: - Last paracentesis on 11/24/2022, 5.5 L drained.  Will hold Lasix due to hypotension.  3.  Left ventricular thrombus (Dx 06/04/2022):: - Coumadin discontinued by cardiology on 09/14/2022 as repeat echo did not show LV thrombus.  Continue aspirin 81 mg daily. - Echocardiogram (11/10/2022): EF 40-45%.  4.  Hypotension: - Blood pressure today 76/44.  He is on Coreg 3.25 mg twice daily.  Midodrine 5 mg 3 times daily was started last week.  He was told to hold Coreg as he is feeling dizzy.  No orders of the defined types were placed in this encounter.     I,Katie Daubenspeck,acting as a Neurosurgeon for Doreatha Massed, MD.,have documented all relevant documentation on the behalf of Doreatha Massed, MD,as directed by  Doreatha Massed, MD while in the presence of Doreatha Massed, MD.   I, Doreatha Massed MD, have reviewed the above documentation for accuracy and completeness, and I agree with the above.   Doreatha Massed, MD   4/22/20245:36 PM  CHIEF COMPLAINT:  Diagnosis: intrahepatic cholangiocarcinoma    Cancer Staging  Cholangiocarcinoma Staging form: Perihilar Bile Ducts, AJCC 8th Edition - Clinical stage  from 07/17/2021: Stage IVB (cTX, cNX, pM1) - Signed by Doreatha Massed, MD on 07/17/2021    Prior Therapy: none  Current Therapy:  Cisplatin + Gemcitabine D1, D15 q 28d    HISTORY OF PRESENT ILLNESS:   Oncology History  Cholangiocarcinoma  02/28/2021 Initial Diagnosis   Cholangiocarcinoma (HCC)   07/17/2021 Cancer Staging   Staging form: Perihilar Bile Ducts, AJCC 8th Edition - Clinical stage from 07/17/2021: Stage IVB (cTX, cNX, pM1) - Signed by Doreatha Massed, MD on 07/17/2021 Stage prefix: Initial diagnosis   07/22/2021 - 04/01/2022 Chemotherapy   Patient is on Treatment Plan : BILIARY TRACT Cisplatin + Gemcitabine D1,15 + Imfinzi day 1 q28d     07/22/2021 -  Chemotherapy   Patient is on Treatment Plan : BILIARY TRACT IMFINZI D1  +  Cisplatin + Gemcitabine  D1, 15 q28d        INTERVAL HISTORY:   Jonathan Keith is a 67 y.o. male presenting to clinic today for follow up of intrahepatic cholangiocarcinoma. He was last seen by me on 11/02/22.  Following his last visit, he underwent paracentesis yielding 3.3 L of fluid. He underwent repeat procedures on 11/08/22 and 11/13/22 while in the hospital and on 11/18/22 and 11/24/22. Of note, cytology was performed on fluid obtained on 11/08/22 and showed suspicion for malignancy.  He presented to the ED on 11/07/22 with a fever and generalized weakness. CT A/P showed: interval increased volume of ascites with mild nodularity throughout omental and mesenteric fat, suspicious for carcinomatosis; interval placement of biliary stent appears well positioned, however there is increased intrahepatic biliary dilatation; no recurrent focal intrahepatic cholangiocarcinoma mass lesion; no evidence of bowel obstruction or perforation. He was taken for ERCP on 11/09/22 under Dr. Russella Dar. He was discharged on 11/16/22.  He also underwent cystoscopy with litholapaxy on 11/19/22 under Dr. Ronne Binning.  Today, he states that he is doing well overall. His appetite level is at  10% %. His energy level is at 10%.  PAST MEDICAL HISTORY:   Past Medical History: Past Medical History:  Diagnosis Date   Cancer    CHF (congestive heart failure)    Chronic pain    neck, back, knees   Class 1 obesity due to excess calories with body mass index (BMI) of 31.0 to 31.9 in adult    Claudication    Coronary artery disease    DDD (degenerative disc disease), cervical    Diabetes mellitus    Dyslipidemia    Hypertension    Port-A-Cath in place 07/20/2021   Tobacco dependence     Surgical History: Past Surgical History:  Procedure Laterality Date   BILIARY BRUSHING N/A 02/25/2021   Procedure: BILIARY BRUSHING;  Surgeon: Malissa Hippo, MD;  Location: [facility]S;  Service: Gastroenterology;  Laterality: N/A;   BILIARY DILATION  10/23/2022   Procedure: BILIARY DILATION;  Surgeon: Meridee Score Netty Starring., MD;  Location: [facility] ENDOSCOPY;  Service: Gastroenterology;;   BILIARY STENT PLACEMENT N/A 02/25/2021   Procedure: BILIARY STENT PLACEMENT;  Surgeon: Malissa Hippo, MD;  Location: [facility]S;  Service: Gastroenterology;  Laterality: N/A;   BILIARY STENT PLACEMENT  02/27/2021   Procedure: BILIARY STENT PLACEMENT (10FR x 9cm) IN THE RIGHT SYSTEM;  Surgeon: Malissa Hippo, MD;  Location: [facility];  Service: Gastroenterology;;   BILIARY STENT PLACEMENT  10/23/2022   Procedure: BILIARY STENT PLACEMENT;  Surgeon: Meridee Score,  Netty Starring., MD;  Location: [facility] ENDOSCOPY;  Service: Gastroenterology;;   BREAST SURGERY Left    benign lump- in his 20s.   CARDIAC CATHETERIZATION     CYSTOSCOPY WITH LITHOLAPAXY N/A 11/19/2022   Procedure: CYSTOSCOPY WITH LITHOLAPAXY;  Surgeon: Malen Gauze, MD;  Location: [facility];  Service: Urology;  Laterality: N/A;   ERCP N/A 02/25/2021   Procedure: ENDOSCOPIC RETROGRADE CHOLANGIOPANCREATOGRAPHY (ERCP);  Surgeon: Malissa Hippo, MD;  Location: [facility];  Service: Gastroenterology;  Laterality: N/A;   ERCP N/A 02/27/2021   Procedure: ENDOSCOPIC  RETROGRADE CHOLANGIOPANCREATOGRAPHY (ERCP);  Surgeon: Malissa Hippo, MD;  Location: [facility];  Service: Gastroenterology;  Laterality: N/A;   ERCP N/A 10/23/2022   Procedure: ENDOSCOPIC RETROGRADE CHOLANGIOPANCREATOGRAPHY (ERCP);  Surgeon: Lemar Lofty., MD;  Location: [facility] ENDOSCOPY;  Service: Gastroenterology;  Laterality: N/A;   ERCP N/A 11/09/2022   Procedure: ENDOSCOPIC RETROGRADE CHOLANGIOPANCREATOGRAPHY (ERCP);  Surgeon: Meryl Dare, MD;  Location: [facility] ENDOSCOPY;  Service: Gastroenterology;  Laterality: N/A;  Stent removal   HOLMIUM LASER APPLICATION N/A 11/19/2022   Procedure: HOLMIUM LASER APPLICATION;  Surgeon: Malen Gauze, MD;  Location: [facility];  Service: Urology;  Laterality: N/A;   IR IMAGING GUIDED PORT INSERTION  07/21/2021   IR PARACENTESIS  11/13/2022   IR REMOVAL TUN ACCESS W/ PORT W/O FL MOD SED  09/30/2022   KNEE SURGERY Left    x2   LEFT HEART CATH AND CORONARY ANGIOGRAPHY N/A 09/08/2021   Procedure: LEFT HEART CATH AND CORONARY ANGIOGRAPHY;  Surgeon: Orbie Pyo, MD;  Location: [facility] INVASIVE CV LAB;  Service: Cardiovascular;  Laterality: N/A;   REMOVAL OF STONES  10/23/2022   Procedure: REMOVAL OF STONES;  Surgeon: Meridee Score Netty Starring., MD;  Location: [facility] ENDOSCOPY;  Service: Gastroenterology;;   REMOVAL OF STONES  11/09/2022   Procedure: REMOVAL OF STONES;  Surgeon: Meryl Dare, MD;  Location: [facility] ENDOSCOPY;  Service: Gastroenterology;;   SPHINCTEROTOMY N/A 02/25/2021   Procedure: Dennison Mascot;  Surgeon: Malissa Hippo, MD;  Location: [facility];  Service: Gastroenterology;  Laterality: N/A;   STENT REMOVAL  02/27/2021   Procedure: STENT REMOVAL (8.5Fr x 9cm);  Surgeon: Malissa Hippo, MD;  Location: [facility];  Service: Gastroenterology;;   Francine Graven REMOVAL  11/09/2022   Procedure: STENT REMOVAL;  Surgeon: Meryl Dare, MD;  Location: [facility] ENDOSCOPY;  Service: Gastroenterology;;    Social History: Social History   Socioeconomic History   Marital  status: Married    Spouse name: Not on file   Number of children: Not on file   Years of education: Not on file   Highest education level: Not on file  Occupational History   Occupation: Heavy Arboriculturist  Tobacco Use   Smoking status: Every Day    Packs/day: 0.50    Years: 56.00    Additional pack years: 0.00    Total pack years: 28.00    Types: Cigarettes   Smokeless tobacco: Never  Vaping Use   Vaping Use: Never used  Substance and Sexual Activity   Alcohol use: Not Currently    Comment: stopped 2.5 years ago 03/11/21   Drug use: Never   Sexual activity: Not on file  Other Topics Concern   Not on file  Social History Narrative   Not on file   Social Determinants of Health   Financial Resource Strain: Medium Risk (03/07/2021)   Overall Financial Resource Strain (CARDIA)    Difficulty of Paying Living Expenses: Somewhat hard  Food Insecurity:  No Food Insecurity (11/30/2022)   Hunger Vital Sign    Worried About Running Out of Food in the Last Year: Never true    Ran Out of Food in the Last Year: Never true  Transportation Needs: No Transportation Needs (11/30/2022)   PRAPARE - Administrator, Civil Service (Medical): No    Lack of Transportation (Non-Medical): No  Physical Activity: Insufficiently Active (03/07/2021)   Exercise Vital Sign    Days of Exercise per Week: 2 days    Minutes of Exercise per Session: 20 min  Stress: No Stress Concern Present (03/07/2021)   [employer] of Occupational Health - Occupational Stress Questionnaire    Feeling of Stress : Only a little  Social Connections: Moderately Integrated (03/07/2021)   Social Connection and Isolation Panel [NHANES]    Frequency of Communication with Friends and Family: More than three times a week    Frequency of Social Gatherings with Friends and Family: More than three times a week    Attends Religious Services: More than 4 times per year    Active Member of [employer] or Organizations: No     Attends Banker Meetings: Never    Marital Status: Married  Catering manager Violence: Not At Risk (11/30/2022)   Humiliation, Afraid, Rape, and Kick questionnaire    Fear of Current or Ex-Partner: No    Emotionally Abused: No    Physically Abused: No    Sexually Abused: No    Family History: Family History  Problem Relation Age of Onset   CAD Mother    Cancer Neg Hx     Current Medications: No current facility-administered medications for this visit. No current outpatient medications on file.  Facility-Administered Medications Ordered in Other Visits:    0.9 %  sodium chloride infusion, , Intravenous, Continuous, Tat, David, MD, Last Rate: 75 mL/hr at 11/30/22 1624, Infusion Verify at 11/30/22 1624   acetaminophen (TYLENOL) tablet 650 mg, 650 mg, Oral, Q6H PRN **OR** acetaminophen (TYLENOL) suppository 650 mg, 650 mg, Rectal, Q6H PRN, Tat, David, MD   albumin human 25 % solution 25 g, 25 g, Intravenous, Q6H, Tat, Onalee Hua, MD, Last Rate: 60 mL/hr at 11/30/22 1624, Infusion Verify at 11/30/22 1624   [START ON 12/01/2022] alfuzosin (UROXATRAL) 24 hr tablet 10 mg, 10 mg, Oral, Q breakfast, Tat, David, MD   heparin injection 5,000 Units, 5,000 Units, Subcutaneous, Q8H, Tat, David, MD   midodrine (PROAMATINE) tablet 5 mg, 5 mg, Oral, TID WC, Tat, David, MD, 5 mg at 11/30/22 1709   ondansetron (ZOFRAN) tablet 4 mg, 4 mg, Oral, Q6H PRN **OR** ondansetron (ZOFRAN) injection 4 mg, 4 mg, Intravenous, Q6H PRN, Tat, David, MD   pantoprazole (PROTONIX) injection 40 mg, 40 mg, Intravenous, Q12H, Tat, Onalee Hua, MD, 40 mg at 11/30/22 1709   Allergies: No Known Allergies  REVIEW OF SYSTEMS:   Review of Systems  Constitutional:  Positive for fatigue. Negative for chills and fever.  HENT:   Negative for lump/mass, mouth sores, nosebleeds, sore throat and trouble swallowing.   Eyes:  Negative for eye problems.  Respiratory:  Negative for cough and shortness of breath.   Cardiovascular:   Positive for chest pain. Negative for leg swelling and palpitations.  Gastrointestinal:  Positive for nausea and vomiting. Negative for abdominal pain, constipation and diarrhea.  Genitourinary:  Negative for bladder incontinence, difficulty urinating, dysuria, frequency, hematuria and nocturia.   Musculoskeletal:  Negative for arthralgias, back pain, flank pain, myalgias and neck pain.  Skin:  Negative for itching and rash.  Neurological:  Negative for dizziness, headaches and numbness.  Hematological:  Does not bruise/bleed easily.  Psychiatric/Behavioral:  Positive for sleep disturbance. Negative for depression and suicidal ideas. The patient is not nervous/anxious.   All other systems reviewed and are negative.    VITALS:   There were no vitals taken for this visit.  Wt Readings from Last 3 Encounters:  11/30/22 162 lb 4.1 oz (73.6 kg)  11/30/22 152 lb 3.2 oz (69 kg)  11/17/22 167 lb 1.7 oz (75.8 kg)    There is no height or weight on file to calculate BMI.  Performance status (ECOG): 2 - Symptomatic, <50% confined to bed  PHYSICAL EXAM:   Physical Exam Vitals and nursing note reviewed. Exam conducted with a chaperone present.  Constitutional:      Appearance: Normal appearance.  Cardiovascular:     Rate and Rhythm: Normal rate and regular rhythm.     Pulses: Normal pulses.     Heart sounds: Normal heart sounds.  Pulmonary:     Effort: Pulmonary effort is normal.     Breath sounds: Normal breath sounds.  Abdominal:     Palpations: Abdomen is soft. There is no hepatomegaly, splenomegaly or mass.     Tenderness: There is no abdominal tenderness.  Musculoskeletal:     Right lower leg: No edema.     Left lower leg: No edema.  Lymphadenopathy:     Cervical: No cervical adenopathy.     Right cervical: No superficial, deep or posterior cervical adenopathy.    Left cervical: No superficial, deep or posterior cervical adenopathy.     Upper Body:     Right upper body: No  supraclavicular or axillary adenopathy.     Left upper body: No supraclavicular or axillary adenopathy.  Neurological:     General: No focal deficit present.     Mental Status: He is alert and oriented to person, place, and time.  Psychiatric:        Mood and Affect: Mood normal.        Behavior: Behavior normal.     LABS:      Latest Ref Rng & Units 11/30/2022   10:00 AM 11/30/2022    7:45 AM 11/16/2022   10:09 AM  CBC  WBC 4.0 - 10.5 K/uL 11.3  12.4  5.8   Hemoglobin 13.0 - 17.0 g/dL 9.5  9.9  7.5   Hematocrit 39.0 - 52.0 % 28.3  29.1  22.6   Platelets 150 - 400 K/uL 137  156  81       Latest Ref Rng & Units 11/30/2022   10:00 AM 11/30/2022    7:45 AM 11/16/2022   10:09 AM  CMP  Glucose 70 - 99 mg/dL 784  696  295   BUN 8 - 23 mg/dL 70  69  30   Creatinine 0.61 - 1.24 mg/dL 2.84  1.32  4.40   Sodium 135 - 145 mmol/L 125  124  137   Potassium 3.5 - 5.1 mmol/L 6.8  6.4  4.1   Chloride 98 - 111 mmol/L 100  99  110   CO2 22 - 32 mmol/L 16  16  21    Calcium 8.9 - 10.3 mg/dL 8.3  8.3  8.6   Total Protein 6.5 - 8.1 g/dL 5.7  6.0  5.3   Total Bilirubin 0.3 - 1.2 mg/dL 3.6  3.7  3.7   Alkaline Phos 38 - 126 U/L 518  551  248   AST 15 - 41 U/L 125  133  57   ALT 0 - 44 U/L 76  80  50      Lab Results  Component Value Date   CEA1 3.9 07/17/2021   /  CEA  Date Value Ref Range Status  07/17/2021 3.9 0.0 - 4.7 ng/mL Final    Comment:    (NOTE)                             Nonsmokers          <3.9                             Smokers             <5.6 Roche [employer] Electrochemiluminescence Immunoassay (ECLIA) Values obtained with different assay methods or kits cannot be used interchangeably.  Results cannot be interpreted as absolute evidence of the presence or absence of malignant disease. Performed At: [facility] [address] [place], [place] [id number] Jolene Schimke MD NW:[id number]    No results found for: "PSA1" Lab Results  Component Value Date    [phone] (H) 10/21/2022   No results found for: "CAN125"  No results found for: "TOTALPROTELP", "ALBUMINELP", "A1GS", "A2GS", "BETS", "BETA2SER", "GAMS", "MSPIKE", "SPEI" Lab Results  Component Value Date   TIBC 139 (L) 09/12/2021   TIBC 154 (L) 03/13/2021   FERRITIN 1,132 (H) 09/12/2021   FERRITIN 708 (H) 03/13/2021   IRONPCTSAT 16 (L) 09/12/2021   IRONPCTSAT 17 (L) 03/13/2021   No results found for: "LDH"   STUDIES:   US Paracentesis  Result Date: 11/24/2022 INDICATION: Cholangiocarcinoma with recurrent ascites EXAM: ULTRASOUND GUIDED LEFT LOWER QUADRANT THERAPEUTIC PARACENTESIS MEDICATIONS: 4 cc 1% lidocaine COMPLICATIONS: None immediate. PROCEDURE: Informed written consent was obtained from the patient after a discussion of the risks, benefits and alternatives to treatment. A timeout was performed prior to the initiation of the procedure. Initial ultrasound scanning demonstrates a large amount of ascites within the left lower abdominal quadrant. The left lower abdomen was prepped and draped in the usual sterile fashion. 1% lidocaine was used for local anesthesia. Following this, a 19 gauge, 7-cm, Yueh catheter was introduced. An ultrasound image was saved for documentation purposes. The paracentesis was performed. The catheter was removed and a dressing was applied. The patient tolerated the procedure well without immediate post procedural complication. FINDINGS: A total of approximately 5.5 L of clear yellow fluid was removed. Ordering provider did not request laboratory samples. IMPRESSION: Successful ultrasound-guided therapeutic paracentesis yielding 5.5 liters of peritoneal fluid. Read by: Mina Marble, PA-C Electronically Signed   By: Roanna Banning M.D.   On: 11/24/2022 14:01   US Paracentesis  Result Date: 11/18/2022 INDICATION: Cholangiocarcinoma; recurrent ascites EXAM: ULTRASOUND GUIDED LLQ PARACENTESIS MEDICATIONS: 10 cc 1% lidocaine. COMPLICATIONS: None immediate. PROCEDURE:  Informed written consent was obtained from the patient after a discussion of the risks, benefits and alternatives to treatment. A timeout was performed prior to the initiation of the procedure. Initial ultrasound scanning demonstrates a large amount of ascites within the left lower abdominal quadrant. The left lower abdomen was prepped and draped in the usual sterile fashion. 1% lidocaine was used for local anesthesia. Following this, a 19 gauge, 7-cm, Yueh catheter was introduced. An ultrasound image was saved for documentation purposes. The paracentesis was performed. The catheter was removed and a  dressing was applied. The patient tolerated the procedure well without immediate post procedural complication. Patient received post-procedure intravenous albumin; see nursing notes for details. FINDINGS: A total of approximately 5.5 liters of cloudy yellow fluid was removed. IMPRESSION: Successful ultrasound-guided paracentesis yielding 5.5 liters of peritoneal fluid. Read by Robet Leu PAC. Supervised and interpreted by Jeronimo Greaves, M.D. Electronically Signed   By: Jeronimo Greaves M.D.   On: 11/18/2022 14:54   IR Paracentesis  Result Date: 11/14/2022 INDICATION: Patient with history of cholangiocarcinoma, recurrent ascites. Request is made for therapeutic paracentesis of up to 2.5 L maximum. EXAM: ULTRASOUND GUIDED THERAPEUTIC PARACENTESIS MEDICATIONS: 10 mL 1% lidocaine COMPLICATIONS: None immediate. PROCEDURE: Informed written consent was obtained from the patient after a discussion of the risks, benefits and alternatives to treatment. A timeout was performed prior to the initiation of the procedure. Initial ultrasound scanning demonstrates a moderate amount of ascites within the right lower abdominal quadrant. The right lower abdomen was prepped and draped in the usual sterile fashion. 1% lidocaine was used for local anesthesia. Following this, a 19 gauge, 7-cm, Yueh catheter was introduced. An ultrasound  image was saved for documentation purposes. The paracentesis was performed. The catheter was removed and a dressing was applied. The patient tolerated the procedure well without immediate post procedural complication. FINDINGS: A total of approximately 2.5 liters of yellow fluid was removed. IMPRESSION: Successful ultrasound-guided paracentesis yielding 2.5 liters of peritoneal fluid. Read by: Loyce Dys PA-C Electronically Signed   By: Marliss Coots M.D.   On: 11/14/2022 09:47   CT ABDOMEN PELVIS WO CONTRAST  Result Date: 11/12/2022 CLINICAL DATA:  History of unresectable cholangiocarcinoma status post portal vein embolization 06/2021 and stent placement on 05/2021 and 10/2022, most recently status post ERCP 11/09/2022 with elevated liver function tests EXAM: CT ABDOMEN AND PELVIS WITHOUT CONTRAST TECHNIQUE: Multidetector CT imaging of the abdomen and pelvis was performed following the standard protocol without IV contrast. RADIATION DOSE REDUCTION: This exam was performed according to the departmental dose-optimization program which includes automated exposure control, adjustment of the mA and/or kV according to patient size and/or use of iterative reconstruction technique. COMPARISON:  CT abdomen pelvis dated 11/07/2022 FINDINGS: Lower chest: Unchanged subpleural 3 mm right middle lobe nodule (5:3). New small right pleural effusion. Partially imaged heart size is normal. Coronary artery calcifications. Hepatobiliary: Prior embolization of known intrahepatic cholangiocarcinoma. No new focal hypoattenuating lesions. Similar intrahepatic bile duct dilation with metallic biliary stent in-situ in unchanged position. Interval removal of plastic stent. Normal gallbladder. Pancreas: No focal lesions or main ductal dilation. Spleen: Normal in size without focal abnormality. Adrenals/Urinary Tract: No adrenal nodules. No suspicious renal mass, calculi or hydronephrosis. Mildly distended urinary bladder with catheter  in intraluminal gas. Mobile calcified stones within the bladder. Stomach/Bowel: Normal appearance of the stomach. No evidence of bowel wall thickening, distention, or inflammatory changes. Normal appendix. Vascular/Lymphatic: Aortic atherosclerosis. No enlarged abdominal or pelvic lymph nodes. Reproductive: Prostate is unremarkable. Other: Increased moderate volume ascites. No free air. Previously noted omental and mesenteric nodularity is less conspicuous. Musculoskeletal: No acute or abnormal lytic or blastic osseous lesions. Multilevel degenerative changes of the partially imaged thoracic and lumbar spine. Increased diffuse body wall edema. IMPRESSION: 1. Prior embolization of known intrahepatic cholangiocarcinoma with similar intrahepatic bile duct dilation with metallic biliary stent in-situ in unchanged position. Interval removal of plastic stent. 2. Increased moderate volume ascites and diffuse body wall edema. 3. New small right pleural effusion. 4. Decreased conspicuity of previously noted omental and  mesenteric nodularity. Attention on follow-up. 5.  Aortic Atherosclerosis (ICD10-I70.0). Electronically Signed   By: Agustin Cree M.D.   On: 11/12/2022 12:57   ECHOCARDIOGRAM COMPLETE  Result Date: 11/10/2022    ECHOCARDIOGRAM REPORT   Patient Name:   Jonathan Keith Date of Exam: 11/10/2022 Medical Rec #:  [id number]       Height:       71.0 in Accession #:    [id number]      Weight:       167.1 lb Date of Birth:  1955/11/06       BSA:          1.954 m Patient Age:    66 years        BP:           101/72 mmHg Patient Gender: M               HR:           74 bpm. Exam Location:  Inpatient Procedure: 2D Echo, Color Doppler, Cardiac Doppler and Intracardiac            Opacification Agent Indications:    Bacteremia  History:        Patient has prior history of Echocardiogram examinations, most                 recent 09/14/2022. CHF, CAD; Risk Factors:Hypertension, Diabetes                 and Dyslipidemia.   Sonographer:    Irving Burton Senior RDCS Referring Phys: [phone] RALPH A NETTEY  Sonographer Comments: Technically difficult due to significantly more lung interference than prior studies. IMPRESSIONS  1. Left ventricular ejection fraction, by estimation, is 40 to 45%. The left ventricle has mildly decreased function. The left ventricle demonstrates regional wall motion abnormalities (see scoring diagram/findings for description). Left ventricular diastolic parameters are consistent with Grade I diastolic dysfunction (impaired relaxation).  2. Right ventricular systolic function is normal. The right ventricular size is normal.  3. The mitral valve is grossly normal. Trivial mitral valve regurgitation.  4. The aortic valve is grossly normal. Aortic valve regurgitation is not visualized. Comparison(s): No significant change from prior study. Conclusion(s)/Recommendation(s): No evidence of valvular vegetations on this transthoracic echocardiogram. Consider a transesophageal echocardiogram to exclude infective endocarditis if clinically indicated. FINDINGS  Left Ventricle: Left ventricular ejection fraction, by estimation, is 40 to 45%. The left ventricle has mildly decreased function. The left ventricle demonstrates regional wall motion abnormalities. Definity contrast agent was given IV to delineate the left ventricular endocardial borders. The left ventricular internal cavity size was normal in size. There is no left ventricular hypertrophy. Left ventricular diastolic parameters are consistent with Grade I diastolic dysfunction (impaired relaxation).  LV Wall Scoring: The basal inferior segment is aneurysmal. The basal inferolateral segment is dyskinetic. The mid inferolateral segment, mid inferoseptal segment, mid inferior segment, and basal inferoseptal segment are hypokinetic. The entire anterior wall, antero-lateral wall, entire anterior septum, and entire apex are normal. Right Ventricle: The right ventricular size is  normal. Right vetricular wall thickness was not well visualized. Right ventricular systolic function is normal. Left Atrium: Left atrial size was normal in size. Right Atrium: Right atrial size was normal in size. Pericardium: There is no evidence of pericardial effusion. Mitral Valve: The mitral valve is grossly normal. Trivial mitral valve regurgitation. Tricuspid Valve: The tricuspid valve is not well visualized. Tricuspid valve regurgitation is not demonstrated. Aortic Valve: The aortic valve  is grossly normal. Aortic valve regurgitation is not visualized. Pulmonic Valve: The pulmonic valve was not well visualized. Pulmonic valve regurgitation is not visualized. Aorta: The aortic root was not well visualized, the ascending aorta was not well visualized and the aortic arch was not well visualized. IAS/Shunts: No atrial level shunt detected by color flow Doppler.   LV Volumes (MOD) LV vol d, MOD A2C: 191.0 ml Diastology LV vol d, MOD A4C: 143.0 ml LV e' medial:    4.35 cm/s LV vol s, MOD A2C: 119.0 ml LV E/e' medial:  14.5 LV vol s, MOD A4C: 70.2 ml  LV e' lateral:   4.24 cm/s LV SV MOD A2C:     72.0 ml  LV E/e' lateral: 14.9 LV SV MOD A4C:     143.0 ml LV SV MOD BP:      73.5 ml RIGHT VENTRICLE RV S prime:     12.90 cm/s TAPSE (M-mode): 2.8 cm LEFT ATRIUM             Index        RIGHT ATRIUM           Index LA Vol (A2C):   47.1 ml 24.11 ml/m  RA Area:     11.20 cm LA Vol (A4C):   41.9 ml 21.45 ml/m  RA Volume:   21.60 ml  11.06 ml/m LA Biplane Vol: 48.1 ml 24.62 ml/m  AORTIC VALVE LVOT Vmax:   81.60 cm/s LVOT Vmean:  67.000 cm/s LVOT VTI:    0.179 m MITRAL VALVE MV Area (PHT): 4.77 cm    SHUNTS MV Decel Time: 159 msec    Systemic VTI: 0.18 m MV E velocity: 63.00 cm/s MV A velocity: 73.70 cm/s MV E/A ratio:  0.85 Riley Lam MD Electronically signed by Riley Lam MD Signature Date/Time: 11/10/2022/6:02:05 PM    Final    DG ERCP  Result Date: 11/09/2022 CLINICAL DATA:  ERCP EXAM: ERCP  TECHNIQUE: Multiple spot images obtained with the fluoroscopic device and submitted for interpretation post-procedure. FLUOROSCOPY: Refer to separate report COMPARISON:  None Available. FINDINGS: A total of 13 fluoroscopic spot images taken during ERCP are submitted for review. Initial images demonstrate a scope overlying the upper abdomen. A plastic biliary stent appears to course through a metallic biliary stent. The plastic biliary stent is removed. Wire catheterization of the common bile duct is performed. Contrast injection opacifies the intrahepatic and extrahepatic biliary tree. The intrahepatic biliary ducts appear normal in caliber. Balloon sweeps are performed through the common bile duct. Final images demonstrate decompression of the intrahepatic biliary tree with passage of contrast material into the small bowel. IMPRESSION: ERCP images as described. Refer to separate procedure report for full details. These images were submitted for radiologic interpretation only. Please see the procedural report for the amount of contrast and the fluoroscopy time utilized. Electronically Signed   By: Olive Bass M.D.   On: 11/09/2022 14:54   US Paracentesis  Result Date: 11/08/2022 INDICATION: Cholangiocarcinoma with small volume ascites request for therapeutic paracentesis. EXAM: ULTRASOUND GUIDED PARACENTESIS MEDICATIONS: 1% lidocaine 10 mL COMPLICATIONS: None immediate. PROCEDURE: Informed written consent was obtained from the patient after a discussion of the risks, benefits and alternatives to treatment. A timeout was performed prior to the initiation of the procedure. Initial ultrasound scanning demonstrates a small amount of ascites within the right upper abdomen. The right upper abdomen was prepped and draped in the usual sterile fashion. 1% lidocaine was used for local anesthesia. Following this, a  19 gauge, 7-cm, Yueh catheter was introduced. An ultrasound image was saved for documentation purposes.  The paracentesis was performed. The catheter was removed and a dressing was applied. The patient tolerated the procedure well without immediate post procedural complication. Scratch FINDINGS: A total of approximately 1.2 L of clear yellow fluid was removed. Samples were sent to the laboratory as requested by the clinical team. IMPRESSION: Successful ultrasound-guided paracentesis yielding 1.2 liters of peritoneal fluid. Procedure performed by: Corrin Parker, PA-C Electronically Signed   By: Irish Lack M.D.   On: 11/08/2022 14:08   CT ABDOMEN PELVIS WO CONTRAST  Result Date: 11/07/2022 CLINICAL DATA:  Abdominal pain, acute, nonlocalized. Fever with weakness and dizziness for 3 days. Jaundice. History of intrahepatic cholangiocarcinoma. * Tracking Code: BO * EXAM: CT ABDOMEN AND PELVIS WITHOUT CONTRAST TECHNIQUE: Multidetector CT imaging of the abdomen and pelvis was performed following the standard protocol without IV contrast. RADIATION DOSE REDUCTION: This exam was performed according to the departmental dose-optimization program which includes automated exposure control, adjustment of the mA and/or kV according to patient size and/or use of iterative reconstruction technique. COMPARISON:  Abdominopelvic CT 08/28/2022. Abdominal MRI 10/19/2022. FINDINGS: Lower chest: 4 mm right middle lobe nodule on image 4/4 is unchanged from the prior chest CT. There is central airway thickening at both lung bases with mild dependent right lower lobe atelectasis. No consolidation or enlarging nodule identified. Aortic and coronary artery atherosclerosis noted. 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 Hepatobiliary: Previous embolization procedure for intrahepatic cholangiocarcinoma noted centrally in the liver, with associated beam hardening artifact. No recurrent focal mass lesion identified. Previously demonstrated known large gallstone is not well visualized. The gallbladder  appears unchanged with mild wall thickening. A metallic biliary stent is in place with interval placement of a plastic biliary stent extending superiorly into the hepatic hilum and inferiorly into the ampulla. There is no pneumobilia, and the intrahepatic biliary dilatation appears slightly increased. Pancreas: Unremarkable. No pancreatic ductal dilatation or surrounding inflammatory changes. Spleen: Normal in size without focal abnormality. Adrenals/Urinary Tract: Both adrenal glands appear normal. No evidence of renal or ureteral calculus, hydronephrosis or perinephric soft tissue stranding. Both kidneys appear unremarkable. A calcified bladder calculus is again noted. Foley catheter extends into the bladder lumen. The bladder is decompressed. Stomach/Bowel: No enteric contrast administered. The stomach appears unremarkable for its degree of distention. There is possible mild small bowel wall thickening in the mid abdomen, but no significant bowel distension. Mild diverticulosis of the distal colon. The colon and appendix otherwise appear unremarkable. Vascular/Lymphatic: There are no enlarged abdominal or pelvic lymph nodes. Diffuse aortic and branch vessel atherosclerosis with chronically displaced intimal calcifications within the infrarenal abdominal aorta. No focal aneurysm. Reproductive: The prostate gland and seminal vesicles appear unremarkable. Other: Interval increased volume of ascites with mild nodularity throughout the omental and mesenteric fat, suspicious for carcinomatosis. No free intraperitoneal air. Musculoskeletal: No acute or significant osseous findings. Interval increased diffuse subcutaneous edema, asymmetric to the right. Stable mild lumbar spondylosis. IMPRESSION: 1. Interval increased volume of ascites with mild nodularity throughout the omental and mesenteric fat, suspicious for carcinomatosis. 2. Interval placement of a plastic biliary stent through the metallic wall stent which  appears well positioned. However, there is increased intrahepatic biliary dilatation and no pneumobilia, suspicious for stent dysfunction. Correlate with liver function studies. 3. Previous embolization procedure for intrahepatic cholangiocarcinoma. No recurrent focal mass lesion identified. 4. No evidence of bowel obstruction or perforation. Possible mild  small bowel wall thickening in the mid abdomen. 5. Stable 4 mm right middle lobe pulmonary nodule. 6. Stable calcified bladder calculus. 7. Interval increased diffuse subcutaneous edema, asymmetric to the right. 8.  Aortic Atherosclerosis (ICD10-I70.0). Electronically Signed   By: Carey Bullocks M.D.   On: 11/07/2022 14:05   DG Chest Port 1 View  Result Date: 11/07/2022 CLINICAL DATA:  Questionable sepsis - evaluate for abnormality EXAM: PORTABLE CHEST - 1 VIEW COMPARISON:  04/09/2022 FINDINGS: Right arm PICC line has been placed to the distal SVC, the right IJ port removed. Lungs are clear. Heart size and mediastinal contours are within normal limits. No effusion. Visualized bones unremarkable. IMPRESSION: No acute cardiopulmonary disease. Electronically Signed   By: Corlis Leak M.D.   On: 11/07/2022 12:03   US Paracentesis  Result Date: 11/02/2022 INDICATION: Malignant ascites. EXAM: ULTRASOUND GUIDED  PARACENTESIS MEDICATIONS: None. COMPLICATIONS: None immediate. PROCEDURE: Informed written consent was obtained from the patient after a discussion of the risks, benefits and alternatives to treatment. A timeout was performed prior to the initiation of the procedure. Initial ultrasound scanning demonstrates a large amount of ascites within the right lower abdominal quadrant. The right lower abdomen was prepped and draped in the usual sterile fashion. 1% lidocaine was used for local anesthesia. Following this, a 8 Fr Safe-T-Centesis catheter was introduced. An ultrasound image was saved for documentation purposes. The paracentesis was performed. The  catheter was removed and a dressing was applied. The patient tolerated the procedure well without immediate post procedural complication. FINDINGS: A total of approximately 3.3 L of yellowish fluid was removed. IMPRESSION: Successful ultrasound-guided paracentesis yielding 3.3 liters of peritoneal fluid. Electronically Signed   By: Bary Richard M.D.   On: 11/02/2022 12:22

## 2022-11-30 ENCOUNTER — Other Ambulatory Visit: Payer: Self-pay

## 2022-11-30 ENCOUNTER — Inpatient Hospital Stay: Payer: Medicare Other

## 2022-11-30 ENCOUNTER — Telehealth: Payer: Self-pay | Admitting: Internal Medicine

## 2022-11-30 ENCOUNTER — Encounter (HOSPITAL_COMMUNITY): Payer: Self-pay | Admitting: Emergency Medicine

## 2022-11-30 ENCOUNTER — Inpatient Hospital Stay (HOSPITAL_BASED_OUTPATIENT_CLINIC_OR_DEPARTMENT_OTHER): Payer: Medicare Other | Admitting: Hematology

## 2022-11-30 ENCOUNTER — Inpatient Hospital Stay (HOSPITAL_COMMUNITY)
Admission: EM | Admit: 2022-11-30 | Discharge: 2022-12-02 | DRG: 641 | Disposition: A | Discharge status: Home Health | Payer: Medicare Other | Source: Ambulatory Visit | Attending: Internal Medicine | Admitting: Internal Medicine

## 2022-11-30 VITALS — BP 76/44 | HR 81 | Temp 96.7°F | Resp 18 | Wt 152.2 lb

## 2022-11-30 DIAGNOSIS — E785 Hyperlipidemia, unspecified: Secondary | ICD-10-CM | POA: Diagnosis present

## 2022-11-30 DIAGNOSIS — Z960 Presence of urogenital implants: Secondary | ICD-10-CM | POA: Diagnosis present

## 2022-11-30 DIAGNOSIS — E1151 Type 2 diabetes mellitus with diabetic peripheral angiopathy without gangrene: Secondary | ICD-10-CM | POA: Diagnosis present

## 2022-11-30 DIAGNOSIS — Z95828 Presence of other vascular implants and grafts: Secondary | ICD-10-CM

## 2022-11-30 DIAGNOSIS — Z66 Do not resuscitate: Secondary | ICD-10-CM | POA: Diagnosis present

## 2022-11-30 DIAGNOSIS — J449 Chronic obstructive pulmonary disease, unspecified: Secondary | ICD-10-CM | POA: Diagnosis present

## 2022-11-30 DIAGNOSIS — I2510 Atherosclerotic heart disease of native coronary artery without angina pectoris: Secondary | ICD-10-CM | POA: Diagnosis present

## 2022-11-30 DIAGNOSIS — F419 Anxiety disorder, unspecified: Secondary | ICD-10-CM | POA: Diagnosis present

## 2022-11-30 DIAGNOSIS — Z8249 Family history of ischemic heart disease and other diseases of the circulatory system: Secondary | ICD-10-CM | POA: Diagnosis not present

## 2022-11-30 DIAGNOSIS — E875 Hyperkalemia: Secondary | ICD-10-CM | POA: Diagnosis present

## 2022-11-30 DIAGNOSIS — F17200 Nicotine dependence, unspecified, uncomplicated: Secondary | ICD-10-CM | POA: Diagnosis not present

## 2022-11-30 DIAGNOSIS — E222 Syndrome of inappropriate secretion of antidiuretic hormone: Secondary | ICD-10-CM | POA: Diagnosis present

## 2022-11-30 DIAGNOSIS — R7401 Elevation of levels of liver transaminase levels: Secondary | ICD-10-CM | POA: Diagnosis not present

## 2022-11-30 DIAGNOSIS — C221 Intrahepatic bile duct carcinoma: Secondary | ICD-10-CM

## 2022-11-30 DIAGNOSIS — Z515 Encounter for palliative care: Secondary | ICD-10-CM | POA: Diagnosis not present

## 2022-11-30 DIAGNOSIS — F17211 Nicotine dependence, cigarettes, in remission: Secondary | ICD-10-CM | POA: Diagnosis present

## 2022-11-30 DIAGNOSIS — I959 Hypotension, unspecified: Secondary | ICD-10-CM | POA: Insufficient documentation

## 2022-11-30 DIAGNOSIS — E1122 Type 2 diabetes mellitus with diabetic chronic kidney disease: Secondary | ICD-10-CM | POA: Diagnosis present

## 2022-11-30 DIAGNOSIS — I447 Left bundle-branch block, unspecified: Secondary | ICD-10-CM | POA: Diagnosis present

## 2022-11-30 DIAGNOSIS — Z452 Encounter for adjustment and management of vascular access device: Secondary | ICD-10-CM

## 2022-11-30 DIAGNOSIS — N1832 Chronic kidney disease, stage 3b: Secondary | ICD-10-CM | POA: Diagnosis present

## 2022-11-30 DIAGNOSIS — Z7189 Other specified counseling: Secondary | ICD-10-CM | POA: Diagnosis not present

## 2022-11-30 DIAGNOSIS — I5022 Chronic systolic (congestive) heart failure: Secondary | ICD-10-CM | POA: Diagnosis present

## 2022-11-30 DIAGNOSIS — E869 Volume depletion, unspecified: Secondary | ICD-10-CM | POA: Diagnosis present

## 2022-11-30 DIAGNOSIS — I130 Hypertensive heart and chronic kidney disease with heart failure and stage 1 through stage 4 chronic kidney disease, or unspecified chronic kidney disease: Secondary | ICD-10-CM | POA: Diagnosis present

## 2022-11-30 DIAGNOSIS — N179 Acute kidney failure, unspecified: Principal | ICD-10-CM | POA: Diagnosis present

## 2022-11-30 DIAGNOSIS — C799 Secondary malignant neoplasm of unspecified site: Secondary | ICD-10-CM | POA: Diagnosis present

## 2022-11-30 DIAGNOSIS — Z79899 Other long term (current) drug therapy: Secondary | ICD-10-CM | POA: Diagnosis not present

## 2022-11-30 DIAGNOSIS — R54 Age-related physical debility: Secondary | ICD-10-CM | POA: Diagnosis present

## 2022-11-30 DIAGNOSIS — I252 Old myocardial infarction: Secondary | ICD-10-CM | POA: Diagnosis not present

## 2022-11-30 DIAGNOSIS — Z7984 Long term (current) use of oral hypoglycemic drugs: Secondary | ICD-10-CM

## 2022-11-30 DIAGNOSIS — I9589 Other hypotension: Secondary | ICD-10-CM | POA: Diagnosis present

## 2022-11-30 DIAGNOSIS — Z5986 Financial insecurity: Secondary | ICD-10-CM

## 2022-11-30 LAB — COMPREHENSIVE METABOLIC PANEL
ALT: 80 U/L — ABNORMAL HIGH (ref 0–44)
ALT: 76 U/L — ABNORMAL HIGH (ref 0–44)
AST: 133 U/L — ABNORMAL HIGH (ref 15–41)
AST: 125 U/L — ABNORMAL HIGH (ref 15–41)
Albumin: 2 g/dL — ABNORMAL LOW (ref 3.5–5.0)
Albumin: 1.9 g/dL — ABNORMAL LOW (ref 3.5–5.0)
Alkaline Phosphatase: 551 U/L — ABNORMAL HIGH (ref 38–126)
Alkaline Phosphatase: 518 U/L — ABNORMAL HIGH (ref 38–126)
Anion gap: 9 (ref 5–15)
Anion gap: 9 (ref 5–15)
BUN: 69 mg/dL — ABNORMAL HIGH (ref 8–23)
BUN: 70 mg/dL — ABNORMAL HIGH (ref 8–23)
CO2: 16 mmol/L — ABNORMAL LOW (ref 22–32)
CO2: 16 mmol/L — ABNORMAL LOW (ref 22–32)
Calcium: 8.3 mg/dL — ABNORMAL LOW (ref 8.9–10.3)
Calcium: 8.3 mg/dL — ABNORMAL LOW (ref 8.9–10.3)
Chloride: 99 mmol/L (ref 98–111)
Chloride: 100 mmol/L (ref 98–111)
Creatinine, Ser: 2.98 mg/dL — ABNORMAL HIGH (ref 0.61–1.24)
Creatinine, Ser: 3.04 mg/dL — ABNORMAL HIGH (ref 0.61–1.24)
GFR, Estimated: 22 mL/min — ABNORMAL LOW (ref >60)
GFR, Estimated: 22 mL/min — ABNORMAL LOW (ref >60)
Glucose, Bld: 135 mg/dL — ABNORMAL HIGH (ref 70–99)
Glucose, Bld: 131 mg/dL — ABNORMAL HIGH (ref 70–99)
Potassium: 6.4 mmol/L (ref 3.5–5.1)
Potassium: 6.8 mmol/L (ref 3.5–5.1)
Sodium: 124 mmol/L — ABNORMAL LOW (ref 135–145)
Sodium: 125 mmol/L — ABNORMAL LOW (ref 135–145)
Total Bilirubin: 3.7 mg/dL — ABNORMAL HIGH (ref 0.3–1.2)
Total Bilirubin: 3.6 mg/dL — ABNORMAL HIGH (ref 0.3–1.2)
Total Protein: 6 g/dL — ABNORMAL LOW (ref 6.5–8.1)
Total Protein: 5.7 g/dL — ABNORMAL LOW (ref 6.5–8.1)

## 2022-11-30 LAB — BASIC METABOLIC PANEL
Anion gap: 7 (ref 5–15)
BUN: 69 mg/dL — ABNORMAL HIGH (ref 8–23)
CO2: 17 mmol/L — ABNORMAL LOW (ref 22–32)
Calcium: 8.1 mg/dL — ABNORMAL LOW (ref 8.9–10.3)
Chloride: 102 mmol/L (ref 98–111)
Creatinine, Ser: 3 mg/dL — ABNORMAL HIGH (ref 0.61–1.24)
GFR, Estimated: 22 mL/min — ABNORMAL LOW (ref >60)
Glucose, Bld: 133 mg/dL — ABNORMAL HIGH (ref 70–99)
Potassium: 5.8 mmol/L — ABNORMAL HIGH (ref 3.5–5.1)
Sodium: 126 mmol/L — ABNORMAL LOW (ref 135–145)

## 2022-11-30 LAB — OSMOLALITY: Osmolality: 287 mOsm/kg (ref 275–295)

## 2022-11-30 LAB — CBC WITH DIFFERENTIAL/PLATELET
Abs Immature Granulocytes: 0.06 10*3/uL (ref 0.00–0.07)
Abs Immature Granulocytes: 0.06 10*3/uL (ref 0.00–0.07)
Basophils Absolute: 0 10*3/uL (ref 0.0–0.1)
Basophils Absolute: 0 10*3/uL (ref 0.0–0.1)
Basophils Relative: 0 %
Basophils Relative: 0 %
Eosinophils Absolute: 0.1 10*3/uL (ref 0.0–0.5)
Eosinophils Absolute: 0 10*3/uL (ref 0.0–0.5)
Eosinophils Relative: 1 %
Eosinophils Relative: 0 %
HCT: 29.1 % — ABNORMAL LOW (ref 39.0–52.0)
HCT: 28.3 % — ABNORMAL LOW (ref 39.0–52.0)
Hemoglobin: 9.9 g/dL — ABNORMAL LOW (ref 13.0–17.0)
Hemoglobin: 9.5 g/dL — ABNORMAL LOW (ref 13.0–17.0)
Immature Granulocytes: 1 %
Immature Granulocytes: 1 %
Lymphocytes Relative: 8 %
Lymphocytes Relative: 7 %
Lymphs Abs: 1 10*3/uL (ref 0.7–4.0)
Lymphs Abs: 0.7 10*3/uL (ref 0.7–4.0)
MCH: 33.6 pg (ref 26.0–34.0)
MCH: 33.1 pg (ref 26.0–34.0)
MCHC: 34 g/dL (ref 30.0–36.0)
MCHC: 33.6 g/dL (ref 30.0–36.0)
MCV: 98.6 fL (ref 80.0–100.0)
MCV: 98.6 fL (ref 80.0–100.0)
Monocytes Absolute: 1.8 10*3/uL — ABNORMAL HIGH (ref 0.1–1.0)
Monocytes Absolute: 1.1 10*3/uL — ABNORMAL HIGH (ref 0.1–1.0)
Monocytes Relative: 14 %
Monocytes Relative: 10 %
Neutro Abs: 9.5 10*3/uL — ABNORMAL HIGH (ref 1.7–7.7)
Neutro Abs: 9.3 10*3/uL — ABNORMAL HIGH (ref 1.7–7.7)
Neutrophils Relative %: 76 %
Neutrophils Relative %: 82 %
Platelets: 156 10*3/uL (ref 150–400)
Platelets: 137 10*3/uL — ABNORMAL LOW (ref 150–400)
RBC: 2.95 MIL/uL — ABNORMAL LOW (ref 4.22–5.81)
RBC: 2.87 MIL/uL — ABNORMAL LOW (ref 4.22–5.81)
RDW: 17.2 % — ABNORMAL HIGH (ref 11.5–15.5)
RDW: 17.3 % — ABNORMAL HIGH (ref 11.5–15.5)
WBC: 12.4 10*3/uL — ABNORMAL HIGH (ref 4.0–10.5)
WBC: 11.3 10*3/uL — ABNORMAL HIGH (ref 4.0–10.5)
nRBC: 0 % (ref 0.0–0.2)
nRBC: 0 % (ref 0.0–0.2)

## 2022-11-30 LAB — URINALYSIS, COMPLETE (UACMP) WITH MICROSCOPIC
Bilirubin Urine: NEGATIVE
Glucose, UA: NEGATIVE mg/dL
Ketones, ur: NEGATIVE mg/dL
Leukocytes,Ua: NEGATIVE
Nitrite: NEGATIVE
Protein, ur: 30 mg/dL — AB
RBC / HPF: >50 RBC/hpf (ref 0–5)
Specific Gravity, Urine: 1.016 (ref 1.005–1.030)
pH: 5 (ref 5.0–8.0)

## 2022-11-30 LAB — LACTIC ACID, PLASMA
Lactic Acid, Venous: 1.8 mmol/L (ref 0.5–1.9)
Lactic Acid, Venous: 2.4 mmol/L (ref 0.5–1.9)
Lactic Acid, Venous: 2 mmol/L (ref 0.5–1.9)

## 2022-11-30 LAB — AMMONIA: Ammonia: 78 umol/L — ABNORMAL HIGH (ref 9–35)

## 2022-11-30 LAB — SODIUM, URINE, RANDOM: Sodium, Ur: <10 mmol/L

## 2022-11-30 LAB — PROCALCITONIN: Procalcitonin: 2.88 ng/mL

## 2022-11-30 LAB — CULTURE, BLOOD (ROUTINE X 2)

## 2022-11-30 LAB — OSMOLALITY, URINE: Osmolality, Ur: 475 mOsm/kg (ref 300–900)

## 2022-11-30 LAB — LIPASE, BLOOD: Lipase: 71 U/L — ABNORMAL HIGH (ref 11–51)

## 2022-11-30 LAB — PROTIME-INR
INR: 1.3 — ABNORMAL HIGH (ref 0.8–1.2)
Prothrombin Time: 15.8 seconds — ABNORMAL HIGH (ref 11.4–15.2)

## 2022-11-30 LAB — MAGNESIUM: Magnesium: 2 mg/dL (ref 1.7–2.4)

## 2022-11-30 MED ORDER — ONDANSETRON HCL 4 MG/2ML IJ SOLN
4.0000 mg | Freq: Four times a day (QID) | INTRAMUSCULAR | Status: DC | PRN
  Administered 2022-12-01 (×2): 4 mg via INTRAVENOUS
  Filled 2022-11-30: qty 2

## 2022-11-30 MED ORDER — ALFUZOSIN HCL ER 10 MG PO TB24
10.0000 mg | ORAL_TABLET | Freq: Every day | ORAL | Status: DC
  Administered 2022-12-01 – 2022-12-02 (×2): 10 mg via ORAL
  Filled 2022-11-30 (×2): qty 1

## 2022-11-30 MED ORDER — SODIUM CHLORIDE 0.9% FLUSH
10.0000 mL | INTRAVENOUS | Status: DC | PRN
  Administered 2022-11-30: 10 mL via INTRAVENOUS

## 2022-11-30 MED ORDER — HEPARIN SOD (PORK) LOCK FLUSH 100 UNIT/ML IV SOLN
500.0000 [IU] | Freq: Once | INTRAVENOUS | Status: AC
  Administered 2022-11-30: 500 [IU] via INTRAVENOUS

## 2022-11-30 MED ORDER — CHLORHEXIDINE GLUCONATE CLOTH 2 % EX PADS
6.0000 | MEDICATED_PAD | Freq: Every day | CUTANEOUS | Status: DC
  Administered 2022-11-30 – 2022-12-02 (×3): 6 via TOPICAL

## 2022-11-30 MED ORDER — ALBUTEROL SULFATE (2.5 MG/3ML) 0.083% IN NEBU
10.0000 mg | INHALATION_SOLUTION | Freq: Once | RESPIRATORY_TRACT | Status: AC
  Administered 2022-11-30: 10 mg via RESPIRATORY_TRACT
  Filled 2022-11-30: qty 12

## 2022-11-30 MED ORDER — ONDANSETRON HCL 4 MG PO TABS: 4.0000 mg | ORAL_TABLET | Freq: Four times a day (QID) | ORAL | Status: DC | PRN

## 2022-11-30 MED ORDER — INSULIN ASPART 100 UNIT/ML IV SOLN
5.0000 [IU] | Freq: Once | INTRAVENOUS | Status: AC
  Administered 2022-11-30: 5 [IU] via INTRAVENOUS

## 2022-11-30 MED ORDER — DEXTROSE 50 % IV SOLN
1.0000 | Freq: Once | INTRAVENOUS | Status: AC
  Administered 2022-11-30: 50 mL via INTRAVENOUS

## 2022-11-30 MED ORDER — MIDODRINE HCL 5 MG PO TABS
5.0000 mg | ORAL_TABLET | Freq: Three times a day (TID) | ORAL | Status: DC
  Administered 2022-11-30 – 2022-12-02 (×7): 5 mg via ORAL
  Filled 2022-11-30 (×7): qty 1

## 2022-11-30 MED ORDER — PANTOPRAZOLE SODIUM 40 MG IV SOLR
40.0000 mg | Freq: Two times a day (BID) | INTRAVENOUS | Status: DC
  Administered 2022-11-30 – 2022-12-02 (×4): 40 mg via INTRAVENOUS
  Filled 2022-11-30 (×4): qty 10

## 2022-11-30 MED ORDER — HEPARIN SODIUM (PORCINE) 5000 UNIT/ML IJ SOLN
5000.0000 [IU] | Freq: Three times a day (TID) | INTRAMUSCULAR | Status: DC
  Administered 2022-11-30 – 2022-12-02 (×6): 5000 [IU] via SUBCUTANEOUS
  Filled 2022-11-30 (×6): qty 1

## 2022-11-30 MED ORDER — ALBUMIN HUMAN 25 % IV SOLN
25.0000 g | Freq: Four times a day (QID) | INTRAVENOUS | Status: AC
  Administered 2022-11-30 – 2022-12-01 (×4): 25 g via INTRAVENOUS
  Filled 2022-11-30 (×3): qty 100

## 2022-11-30 MED ORDER — SODIUM CHLORIDE 0.9 % IV SOLN: INTRAVENOUS | Status: AC

## 2022-11-30 MED ORDER — SODIUM CHLORIDE 0.9 % IV BOLUS
500.0000 mL | Freq: Once | INTRAVENOUS | Status: AC
  Administered 2022-11-30: 500 mL via INTRAVENOUS

## 2022-11-30 MED ORDER — SODIUM ZIRCONIUM CYCLOSILICATE 5 G PO PACK
10.0000 g | PACK | Freq: Once | ORAL | Status: AC
  Administered 2022-11-30: 10 g via ORAL
  Filled 2022-11-30: qty 2

## 2022-11-30 MED ORDER — ACETAMINOPHEN 650 MG RE SUPP: 650.0000 mg | Freq: Four times a day (QID) | RECTAL | Status: DC | PRN

## 2022-11-30 MED ORDER — ALBUMIN HUMAN 25 % IV SOLN
25.0000 g | Freq: Once | INTRAVENOUS | Status: DC
  Filled 2022-11-30: qty 100

## 2022-11-30 MED ORDER — ACETAMINOPHEN 325 MG PO TABS: 650.0000 mg | ORAL_TABLET | Freq: Four times a day (QID) | ORAL | Status: DC | PRN

## 2022-11-30 MED ORDER — PIPERACILLIN-TAZOBACTAM 3.375 G IVPB
3.3750 g | Freq: Three times a day (TID) | INTRAVENOUS | Status: DC
  Administered 2022-11-30 – 2022-12-02 (×6): 3.375 g via INTRAVENOUS
  Filled 2022-11-30 (×6): qty 50

## 2022-11-30 NOTE — Progress Notes (Signed)
Pt to be evaluated in ED per Dr. Ellin Saba for critical potassium of 6.4. Report called to Irving Burton, ED Charge RN. Pt transported to ED Rm 6 via wheelchair.

## 2022-11-30 NOTE — ED Provider Notes (Signed)
[place] EMERGENCY DEPARTMENT AT [facility] Provider Note   CSN: [id number] Arrival date & time: 11/30/22  4098     History  Chief Complaint  Patient presents with   Abnormal Labs     Jonathan Keith is a 67 y.o. male history of diabetes, metastatic cholangiocarcinoma, hypertension sent to the emergency department from the cancer center for due to abnormal labs.  Patient got labs drawn this morning, was found to have potassium level of 6.4, white count 12.4, sodium 124, elevated AST, ALT. Per family at bedside patient has been having dizziness in the last week.  They also report low blood pressure at home.  Denies any fever.  Endorses nausea and vomiting.  Denies any abdominal pain, chest pain, shortness of breath, rash.  Per family patient is mentating at his baseline.  Of note patient had recent hospitalization due to elevated bilirubin.  He underwent ERCP on 10/23/2022 with stent placement in the left hepatic duct. Laboratory work and paracentesis was done during last admission on 11/18/2022 removed 5.5 L of yellow fluid.     HPI    Past Medical History:  Diagnosis Date   Cancer    CHF (congestive heart failure)    Chronic pain    neck, back, knees   Class 1 obesity due to excess calories with body mass index (BMI) of 31.0 to 31.9 in adult    Claudication    Coronary artery disease    DDD (degenerative disc disease), cervical    Diabetes mellitus    Dyslipidemia    Hypertension    Port-A-Cath in place 07/20/2021   Tobacco dependence    Past Surgical History:  Procedure Laterality Date   BILIARY BRUSHING N/A 02/25/2021   Procedure: BILIARY BRUSHING;  Surgeon: Malissa Hippo, MD;  Location: [facility];  Service: Gastroenterology;  Laterality: N/A;   BILIARY DILATION  10/23/2022   Procedure: BILIARY DILATION;  Surgeon: Meridee Score Netty Starring., MD;  Location: [facility] ENDOSCOPY;  Service: Gastroenterology;;   BILIARY STENT PLACEMENT N/A 02/25/2021   Procedure: BILIARY  STENT PLACEMENT;  Surgeon: Malissa Hippo, MD;  Location: [facility];  Service: Gastroenterology;  Laterality: N/A;   BILIARY STENT PLACEMENT  02/27/2021   Procedure: BILIARY STENT PLACEMENT (10FR x 9cm) IN THE RIGHT SYSTEM;  Surgeon: Malissa Hippo, MD;  Location: [facility];  Service: Gastroenterology;;   BILIARY STENT PLACEMENT  10/23/2022   Procedure: BILIARY STENT PLACEMENT;  Surgeon: Lemar Lofty., MD;  Location: [facility] ENDOSCOPY;  Service: Gastroenterology;;   BREAST SURGERY Left    benign lump- in his 60s.   CARDIAC CATHETERIZATION     CYSTOSCOPY WITH LITHOLAPAXY N/A 11/19/2022   Procedure: CYSTOSCOPY WITH LITHOLAPAXY;  Surgeon: Malen Gauze, MD;  Location: [facility];  Service: Urology;  Laterality: N/A;   ERCP N/A 02/25/2021   Procedure: ENDOSCOPIC RETROGRADE CHOLANGIOPANCREATOGRAPHY (ERCP);  Surgeon: Malissa Hippo, MD;  Location: [facility];  Service: Gastroenterology;  Laterality: N/A;   ERCP N/A 02/27/2021   Procedure: ENDOSCOPIC RETROGRADE CHOLANGIOPANCREATOGRAPHY (ERCP);  Surgeon: Malissa Hippo, MD;  Location: [facility];  Service: Gastroenterology;  Laterality: N/A;   ERCP N/A 10/23/2022   Procedure: ENDOSCOPIC RETROGRADE CHOLANGIOPANCREATOGRAPHY (ERCP);  Surgeon: Lemar Lofty., MD;  Location: [facility] ENDOSCOPY;  Service: Gastroenterology;  Laterality: N/A;   ERCP N/A 11/09/2022   Procedure: ENDOSCOPIC RETROGRADE CHOLANGIOPANCREATOGRAPHY (ERCP);  Surgeon: Meryl Dare, MD;  Location: [facility] ENDOSCOPY;  Service: Gastroenterology;  Laterality: N/A;  Stent removal   HOLMIUM LASER APPLICATION  N/A 11/19/2022   Procedure: HOLMIUM LASER APPLICATION;  Surgeon: Malen Gauze, MD;  Location: [facility];  Service: Urology;  Laterality: N/A;   IR IMAGING GUIDED PORT INSERTION  07/21/2021   IR PARACENTESIS  11/13/2022   IR REMOVAL TUN ACCESS W/ PORT W/O FL MOD SED  09/30/2022   KNEE SURGERY Left    x2   LEFT HEART CATH AND CORONARY ANGIOGRAPHY N/A 09/08/2021   Procedure: LEFT HEART CATH  AND CORONARY ANGIOGRAPHY;  Surgeon: Orbie Pyo, MD;  Location: [facility] INVASIVE CV LAB;  Service: Cardiovascular;  Laterality: N/A;   REMOVAL OF STONES  10/23/2022   Procedure: REMOVAL OF STONES;  Surgeon: Meridee Score Netty Starring., MD;  Location: [facility] ENDOSCOPY;  Service: Gastroenterology;;   REMOVAL OF STONES  11/09/2022   Procedure: REMOVAL OF STONES;  Surgeon: Meryl Dare, MD;  Location: [facility] ENDOSCOPY;  Service: Gastroenterology;;   SPHINCTEROTOMY N/A 02/25/2021   Procedure: Dennison Mascot;  Surgeon: Malissa Hippo, MD;  Location: [facility];  Service: Gastroenterology;  Laterality: N/A;   STENT REMOVAL  02/27/2021   Procedure: STENT REMOVAL (8.5Fr x 9cm);  Surgeon: Malissa Hippo, MD;  Location: [facility];  Service: Gastroenterology;;   Francine Graven REMOVAL  11/09/2022   Procedure: STENT REMOVAL;  Surgeon: Meryl Dare, MD;  Location: [facility] ENDOSCOPY;  Service: Gastroenterology;;     Home Medications Prior to Admission medications   Medication Sig Start Date End Date Taking? Authorizing Provider  acetaminophen (TYLENOL) 500 MG tablet Take 500 mg by mouth every 6 (six) hours as needed for moderate pain or headache.    [provider]  albuterol (VENTOLIN HFA) 108 (90 Base) MCG/ACT inhaler Inhale 2 puffs into the lungs every 6 (six) hours as needed for wheezing or shortness of breath. 11/29/21   Tat, Onalee Hua, MD  alfuzosin (UROXATRAL) 10 MG 24 hr tablet Take 1 tablet (10 mg total) by mouth at bedtime. 08/21/22   McKenzie, Mardene Celeste, MD  atorvastatin (LIPITOR) 40 MG tablet Take 1 tablet (40 mg total) by mouth daily at 6 PM. 12/05/21   O'Neal, Ronnald Ramp, MD  CISplatin (PLATINOL) 50 MG/50ML SOLN Inject 50 mg into the vein once a week. On days 1 and 8, every 21 days    [provider]  durvalumab 1,500 mg in sodium chloride 0.9 % 100 mL Inject 1,500 mg into the vein every 21 ( twenty-one) days.    [provider]  famotidine (PEPCID) 20 MG tablet Take 20 mg by mouth 2 (two) times  daily.    [provider]  ferrous sulfate (FERROUSUL) 325 (65 FE) MG tablet Take 1 tablet (325 mg total) by mouth daily with breakfast. 03/18/21   Rehman, Joline Maxcy, MD  gemcitabine 760 mg/m2 in sodium chloride 0.9 % 100 mL Inject 760 mg/m2 into the vein once a week. On days 1 and 8 every 21 days    [provider]  guaifenesin (ROBITUSSIN) 100 MG/5ML syrup Take 200 mg by mouth 3 (three) times daily as needed for cough.    [provider]  lidocaine (LIDODERM) 5 % Place 1 patch onto the skin daily. Remove & Discard patch within 12 hours or as directed by MD 11/16/22   Joycelyn Das, MD  metFORMIN (GLUCOPHAGE) 500 MG tablet Take 500 mg by mouth 2 (two) times daily with a meal.    [provider]  midodrine (PROAMATINE) 5 MG tablet Take 1 tablet (5 mg total) by mouth 3 (three) times daily with meals. 11/27/22  Doreatha Massed, MD  Misc. Devices KIT 1 Box by Does not apply route every 7 (seven) days. 10/20/22   Doreatha Massed, MD  Multiple Vitamin (MULTIVITAMIN WITH MINERALS) TABS tablet Take 1 tablet by mouth daily. 11/17/22 02/25/23  Pokhrel, Rebekah Chesterfield, MD  nitroGLYCERIN (NITROSTAT) 0.4 MG SL tablet Place 1 tablet (0.4 mg total) under the tongue every 5 (five) minutes as needed for chest pain. 09/12/21 11/07/22  Marcelino Duster, PA  ondansetron (ZOFRAN) 4 MG tablet Take 1 tablet (4 mg total) by mouth every 6 (six) hours as needed for nausea. 11/16/22   Pokhrel, Rebekah Chesterfield, MD  oxyCODONE-acetaminophen (PERCOCET) 5-325 MG tablet Take 1 tablet by mouth every 4 (four) hours as needed for severe pain. 11/19/22 11/19/23  Malen Gauze, MD  pantoprazole (PROTONIX) 40 MG tablet Take 1 tablet (40 mg total) by mouth daily. 12/05/21   O'Neal, Ronnald Ramp, MD  prochlorperazine (COMPAZINE) 10 MG tablet Take 1 tablet (10 mg total) by mouth every 6 (six) hours as needed for nausea or vomiting. 11/27/22   Doreatha Massed, MD  sodium chloride flush (NS) 0.9 % SOLN 10 mLs by  Intracatheter route every 7 (seven) days. Patient taking differently: 10 mLs by Intracatheter route daily. 10/20/22   Doreatha Massed, MD  Wound Dressings (INTRASITE GEL APPLIPAK) GEL Apply 1 Units topically daily. 10/05/22   Doreatha Massed, MD      Allergies    Patient has no known allergies.    Review of Systems   Review of Systems Negative except as per HPI.  Physical Exam Updated Vital Signs BP 97/70   Pulse 85   Temp 97.7 F (36.5 C) (Oral)   Resp 18   Ht 5\' 11"  (1.803 m)   Wt 69.8 kg   SpO2 99%   BMI 21.46 kg/m  Physical Exam Vitals and nursing note reviewed.  Constitutional:      Appearance: Normal appearance.  HENT:     Head: Normocephalic and atraumatic.     Mouth/Throat:     Mouth: Mucous membranes are moist.  Eyes:     General: No scleral icterus. Cardiovascular:     Rate and Rhythm: Normal rate and regular rhythm.     Pulses: Normal pulses.     Heart sounds: Normal heart sounds.  Pulmonary:     Effort: Pulmonary effort is normal.     Breath sounds: Normal breath sounds.  Abdominal:     General: Abdomen is flat. There is distension.     Palpations: Abdomen is soft.     Tenderness: There is no abdominal tenderness.  Musculoskeletal:        General: No deformity.  Skin:    General: Skin is warm.     Findings: No rash.  Neurological:     General: No focal deficit present.     Mental Status: He is alert.  Psychiatric:        Mood and Affect: Mood normal.     ED Results / Procedures / Treatments   Labs (all labs ordered are listed, but only abnormal results are displayed) Labs Reviewed  CBC WITH DIFFERENTIAL/PLATELET  COMPREHENSIVE METABOLIC PANEL  LIPASE, BLOOD  AMMONIA  LACTIC ACID, PLASMA  LACTIC ACID, PLASMA  PROTIME-INR  I-STAT CHEM 8, ED    EKG None  Radiology No results found.  Procedures .Critical Care  Performed by: Jeanelle Malling, PA Authorized by: Jeanelle Malling, PA   Critical care provider statement:    Critical care  time (minutes):  30  Critical care time was exclusive of:  Separately billable procedures and treating other patients and teaching time   Critical care was necessary to treat or prevent imminent or life-threatening deterioration of the following conditions:  Renal failure   Critical care was time spent personally by me on the following activities:  Development of treatment plan with patient or surrogate, discussions with consultants, evaluation of patient's response to treatment, examination of patient, ordering and review of laboratory studies, ordering and review of radiographic studies, ordering and performing treatments and interventions, pulse oximetry, re-evaluation of patient's condition and review of old charts     Medications Ordered in ED Medications - No data to display  ED Course/ Medical Decision Making/ A&P                             Medical Decision Making Amount and/or Complexity of Data Reviewed Labs: ordered.  Risk OTC drugs. Prescription drug management. Decision regarding hospitalization.   This patient presents to the ED for abnormal labs, this involves an extensive number of treatment options, and is a complaint that carries with a high risk of complications and morbidity.  The differential diagnosis includes hypokalemia, hyponatremia, renal failure.  This is not an exhaustive list.  Lab tests: I ordered and personally interpreted labs.  The pertinent results include: WBC 11.3. Hbg unremarkable compared to baseline. Platelets unremarkable. Electrolytes significant for potassium 6.8, sodium 125. BUN 70, creatinine 3.04.   Imaging studies:  Problem list/ ED course/ Critical interventions/ Medical management: HPI: See above Vital signs within normal range and stable throughout visit. Laboratory/imaging studies significant for: See above. On physical examination, patient is afebrile and appears in no acute distress. Patient found to have asymptomatic  hyperkalemia with no ecg changes likely secondary to ESRD vs tumor lysis syndrome. Patient not taking ACE-I, ARBs, SGLT2 inhibitor, digoxin, no recent burns or trauma to explain hyperkalemia. Doubt drug induced, unlikely secondary to crush or thermal injury. Given CBC and BMP results doubt DKA.  Patient given IV fluid, dextrose, insulin, as well as albuterol and Lokelma to reduce potassium level.  Patient will require admission for further evaluation and management.  I have reviewed the patient home medicines and have made adjustments as needed.  Cardiac monitoring/EKG: The patient was maintained on a cardiac monitor.  I personally reviewed and interpreted the cardiac monitor which showed an underlying rhythm of: sinus rhythm.  Additional history obtained: External records from outside source obtained and reviewed including: Chart review including previous notes, labs, imaging.  Consultations obtained: I requested consultation with Dr. Arbutus Leas, and discussed lab and imaging findings as well as pertinent plan.  He/she recommended admission..  Disposition Admit.  This chart was dictated using voice recognition software.  Despite best efforts to proofread,  errors can occur which can change the documentation meaning.          Final Clinical Impression(s) / ED Diagnoses Final diagnoses:  Acute renal failure, unspecified acute renal failure type    Rx / DC Orders ED Discharge Orders     None         Jeanelle Malling, [place] 12/01/22 2041    Gloris Manchester, MD 12/05/22 1535

## 2022-11-30 NOTE — Progress Notes (Signed)
CRITICAL VALUE ALERT Critical value received:  K+ 6.4 Date of notification:  11-30-22 Time of notification: 0828 Critical value read back:  Yes.   Nurse who received alert:  C. Luther Newhouse RN MD notified time and response:  [phone]. Dr. Ellin Saba, sending pt to the Emergency room per MD.

## 2022-11-30 NOTE — Hospital Course (Addendum)
67 year old male with a history of metastatic cholangiocarcinoma, coronary artery disease with STEMI February 2023, HFrEF, COPD, tobacco abuse, diabetes mellitus type 2, hyperlipidemia, and CKD stage IIIb presenting from [facility] cancer center secondary to abnormal labs.  The patient had his routine appointment for chemotherapy on 11/30/2022.  Labs were done routinely and noted sodium 125, potassium 6.8, serum creatinine 3.00.  AST 125, ALT 76, total bilirubin 3.6.  WBC 12.4, hemoglobin 9.9, platelets 156.  The patient was sent to the emergency department for further evaluation and treatment for his lab abnormalities.  Notably, the patient was recently hospitalized from 11/07/2022 to 11/16/2022 for sepsis secondary to Enterococcus faecalis bacteremia.  He was treated with Unasyn and subsequently transition to oral amoxicillin which he finished on 11/22/2022.  During that hospitalization, the patient had ERCP on 11/09/2022 with removal of his plastic stent. ERCP 4/1 showed partial occlusion of plastic biliary stent that went from CBD to left main hepatic duct, removed.  Previously placed uncovered metal stent was patent.  Biliary tree swept of sludge. -Repeat CT 4/4 shows similar intrahepatic bile duct dilation with metal biliary stent in place, moderate volume ascites and diffuse body wall edema  Since discharge from the hospital, the patient underwent cystoscopy with cystolitholopaxy performed by Dr. Ronne Binning on 11/19/2022.  Foley catheter was placed after the procedure.  The patient's son at the bedside supplements the history.  The patient has had increasing generalized weakness for the past week.  He has had some dizziness with low BPs for the past week.  He is having some intermittent nausea and vomiting primarily with food.  There is no hematemesis, diarrhea, hematochezia, melena.  He has not any fevers or chills.  There is no chest pain, shortness breath, cough, hemoptysis.  He denies any headache or neck pain,  abdominal pain,  In the ED, the patient was afebrile with soft blood pressure.  Oxygen saturation was 100% room air.  Labs are discussed above. Lactic acid 1.8.  AST 125, ALT at 76, alkaline phosphatase 518, total bilirubin 3.6. I have placed orders for blood cultures and urine cultures to be collected in the emergency department.  The patient was start on empiric zosyn and IVF were started.  The patient was given multiple doses of intravenous albumin.  His renal function stabilized.  Palliative medicine was consulted.  Multiple goals of care discussions were held.  The patient's sodium gradually improved and stabilized.  His blood cultures remain negative.  The patient remained afebrile and hemodynamically stable albeit with soft blood pressures. After numerous goals of care discussions, the patient and her family initially were interested in placing a Pleurx catheter for ascites drainage as a palliative measure for hospice care.  However, after period of time the patient and family changed her mind and deferred to Pleurx catheter placement.  Ultimately, the patient and family wanted to go home with home health and continued full scope of care.

## 2022-11-30 NOTE — Telephone Encounter (Signed)
Left a message for patient/spouse to call office back regarding medication changes.

## 2022-11-30 NOTE — H&P (Signed)
History and Physical    Patient: Jonathan Keith UJW:[id number] DOB: [birth year] DOA: 11/30/2022 DOS: the patient was seen and examined on 11/30/2022 PCP: Elfredia Nevins, MD  Patient coming from: Home  Chief Complaint:  Chief Complaint  Patient presents with   Abnormal Labs    HPI: Jonathan Keith is a 67 year old male with a history of metastatic cholangiocarcinoma, coronary artery disease with STEMI February 2023, HFrEF, COPD, tobacco abuse, diabetes mellitus type 2, hyperlipidemia, and CKD stage IIIb presenting from [facility] secondary to abnormal labs.  The patient had his routine appointment for chemotherapy on 11/30/2022.  Labs were done routinely and noted sodium 125, potassium 6.8, serum creatinine 3.00.  AST 125, ALT 76, total bilirubin 3.6.  WBC 12.4, hemoglobin 9.9, platelets 156.  The patient was sent to the emergency department for further evaluation and treatment for his lab abnormalities.  Notably, the patient was recently hospitalized from 11/07/2022 to 11/16/2022 for sepsis secondary to Enterococcus faecalis bacteremia.  He was treated with Unasyn and subsequently transition to oral amoxicillin which he finished on 11/22/2022.  During that hospitalization, the patient had ERCP on 11/09/2022 with removal of his plastic stent. ERCP 4/1 showed partial occlusion of plastic biliary stent that went from CBD to left main hepatic duct, removed.  Previously placed uncovered metal stent was patent.  Biliary tree swept of sludge. -Repeat CT 4/4 shows similar intrahepatic bile duct dilation with metal biliary stent in place, moderate volume ascites and diffuse body wall edema  Since discharge from the hospital, the patient underwent cystoscopy with cystolitholopaxy performed by Dr. Ronne Binning on 11/19/2022.  Foley catheter was placed after the procedure.  The patient's son at the bedside supplements the history.  The patient has had increasing generalized weakness for the past week.  He has  had some dizziness with low BPs for the past week.  He is having some intermittent nausea and vomiting primarily with food.  There is no hematemesis, diarrhea, hematochezia, melena.  He has not any fevers or chills.  There is no chest pain, shortness breath, cough, hemoptysis.  He denies any headache or neck pain, abdominal pain,  In the ED, the patient was afebrile with soft blood pressure.  Oxygen saturation was 100% room air.  Labs are discussed above. Lactic acid 1.8.  AST 125, ALT at 76, alkaline phosphatase 518, total bilirubin 3.6. I have placed orders for blood cultures and urine cultures to be collected in the emergency department.  Review of Systems: As mentioned in the history of present illness. All other systems reviewed and are negative. Past Medical History:  Diagnosis Date   Cancer    CHF (congestive heart failure)    Chronic pain    neck, back, knees   Class 1 obesity due to excess calories with body mass index (BMI) of 31.0 to 31.9 in adult    Claudication    Coronary artery disease    DDD (degenerative disc disease), cervical    Diabetes mellitus    Dyslipidemia    Hypertension    Port-A-Cath in place 07/20/2021   Tobacco dependence    Past Surgical History:  Procedure Laterality Date   BILIARY BRUSHING N/A 02/25/2021   Procedure: BILIARY BRUSHING;  Surgeon: Malissa Hippo, MD;  Location: [facility];  Service: Gastroenterology;  Laterality: N/A;   BILIARY DILATION  10/23/2022   Procedure: BILIARY DILATION;  Surgeon: Meridee Score Netty Starring., MD;  Location: [facility] ENDOSCOPY;  Service: Gastroenterology;;   BILIARY STENT PLACEMENT N/A 02/25/2021  Procedure: BILIARY STENT PLACEMENT;  Surgeon: Malissa Hippo, MD;  Location: [facility];  Service: Gastroenterology;  Laterality: N/A;   BILIARY STENT PLACEMENT  02/27/2021   Procedure: BILIARY STENT PLACEMENT (10FR x 9cm) IN THE RIGHT SYSTEM;  Surgeon: Malissa Hippo, MD;  Location: [facility];  Service: Gastroenterology;;   BILIARY  STENT PLACEMENT  10/23/2022   Procedure: BILIARY STENT PLACEMENT;  Surgeon: Lemar Lofty., MD;  Location: [facility] ENDOSCOPY;  Service: Gastroenterology;;   BREAST SURGERY Left    benign lump- in his 52s.   CARDIAC CATHETERIZATION     CYSTOSCOPY WITH LITHOLAPAXY N/A 11/19/2022   Procedure: CYSTOSCOPY WITH LITHOLAPAXY;  Surgeon: Malen Gauze, MD;  Location: [facility];  Service: Urology;  Laterality: N/A;   ERCP N/A 02/25/2021   Procedure: ENDOSCOPIC RETROGRADE CHOLANGIOPANCREATOGRAPHY (ERCP);  Surgeon: Malissa Hippo, MD;  Location: [facility];  Service: Gastroenterology;  Laterality: N/A;   ERCP N/A 02/27/2021   Procedure: ENDOSCOPIC RETROGRADE CHOLANGIOPANCREATOGRAPHY (ERCP);  Surgeon: Malissa Hippo, MD;  Location: [facility];  Service: Gastroenterology;  Laterality: N/A;   ERCP N/A 10/23/2022   Procedure: ENDOSCOPIC RETROGRADE CHOLANGIOPANCREATOGRAPHY (ERCP);  Surgeon: Lemar Lofty., MD;  Location: [facility] ENDOSCOPY;  Service: Gastroenterology;  Laterality: N/A;   ERCP N/A 11/09/2022   Procedure: ENDOSCOPIC RETROGRADE CHOLANGIOPANCREATOGRAPHY (ERCP);  Surgeon: Meryl Dare, MD;  Location: [facility] ENDOSCOPY;  Service: Gastroenterology;  Laterality: N/A;  Stent removal   HOLMIUM LASER APPLICATION N/A 11/19/2022   Procedure: HOLMIUM LASER APPLICATION;  Surgeon: Malen Gauze, MD;  Location: [facility];  Service: Urology;  Laterality: N/A;   IR IMAGING GUIDED PORT INSERTION  07/21/2021   IR PARACENTESIS  11/13/2022   IR REMOVAL TUN ACCESS W/ PORT W/O FL MOD SED  09/30/2022   KNEE SURGERY Left    x2   LEFT HEART CATH AND CORONARY ANGIOGRAPHY N/A 09/08/2021   Procedure: LEFT HEART CATH AND CORONARY ANGIOGRAPHY;  Surgeon: Orbie Pyo, MD;  Location: [facility]VASIVE CV LAB;  Service: Cardiovascular;  Laterality: N/A;   REMOVAL OF STONES  10/23/2022   Procedure: REMOVAL OF STONES;  Surgeon: Meridee Score Netty Starring., MD;  Location: [facility] ENDOSCOPY;  Service: Gastroenterology;;   REMOVAL OF STONES   11/09/2022   Procedure: REMOVAL OF STONES;  Surgeon: Meryl Dare, MD;  Location: [facility] ENDOSCOPY;  Service: Gastroenterology;;   SPHINCTEROTOMY N/A 02/25/2021   Procedure: Dennison Mascot;  Surgeon: Malissa Hippo, MD;  Location: [facility];  Service: Gastroenterology;  Laterality: N/A;   STENT REMOVAL  02/27/2021   Procedure: STENT REMOVAL (8.5Fr x 9cm);  Surgeon: Malissa Hippo, MD;  Location: [facility];  Service: Gastroenterology;;   Francine Graven REMOVAL  11/09/2022   Procedure: STENT REMOVAL;  Surgeon: Meryl Dare, MD;  Location: [facility] ENDOSCOPY;  Service: Gastroenterology;;   Social History:  reports that he has been smoking cigarettes. He has a 28.00 pack-year smoking history. He has never used smokeless tobacco. He reports that he does not currently use alcohol. He reports that he does not use drugs.  No Known Allergies  Family History  Problem Relation Age of Onset   CAD Mother    Cancer Neg Hx     Prior to Admission medications   Medication Sig Start Date End Date Taking? Authorizing Provider  acetaminophen (TYLENOL) 500 MG tablet Take 500 mg by mouth every 6 (six) hours as needed for moderate pain or headache.   Yes [provider]  albuterol (VENTOLIN HFA) 108 (90 Base) MCG/ACT inhaler Inhale 2 puffs  into the lungs every 6 (six) hours as needed for wheezing or shortness of breath. 11/29/21  Yes Adele Milson, Onalee Hua, MD  alfuzosin (UROXATRAL) 10 MG 24 hr tablet Take 1 tablet (10 mg total) by mouth at bedtime. 08/21/22  Yes McKenzie, Mardene Celeste, MD  carvedilol (COREG) 3.125 MG tablet Take 3.125 mg by mouth 2 (two) times daily with a meal.   Yes [provider]  famotidine (PEPCID) 20 MG tablet Take 20 mg by mouth 2 (two) times daily.   Yes [provider]  ferrous sulfate (FERROUSUL) 325 (65 FE) MG tablet Take 1 tablet (325 mg total) by mouth daily with breakfast. 03/18/21  Yes Rehman, Joline Maxcy, MD  guaifenesin (ROBITUSSIN) 100 MG/5ML syrup Take 200 mg by mouth 3 (three) times  daily as needed for cough.   Yes [provider]  metFORMIN (GLUCOPHAGE) 500 MG tablet Take 500 mg by mouth 2 (two) times daily with a meal.   Yes [provider]  midodrine (PROAMATINE) 5 MG tablet Take 1 tablet (5 mg total) by mouth 3 (three) times daily with meals. 11/27/22  Yes Doreatha Massed, MD  Multiple Vitamin (MULTIVITAMIN WITH MINERALS) TABS tablet Take 1 tablet by mouth daily. 11/17/22 02/25/23 Yes Pokhrel, Rebekah Chesterfield, MD  nitroGLYCERIN (NITROSTAT) 0.4 MG SL tablet Place 1 tablet (0.4 mg total) under the tongue every 5 (five) minutes as needed for chest pain. 09/12/21 11/30/22 Yes Duke, Roe Rutherford, PA  ondansetron (ZOFRAN) 4 MG tablet Take 1 tablet (4 mg total) by mouth every 6 (six) hours as needed for nausea. 11/16/22  Yes Pokhrel, Laxman, MD  pantoprazole (PROTONIX) 40 MG tablet Take 1 tablet (40 mg total) by mouth daily. 12/05/21  Yes O'Neal, Ronnald Ramp, MD  prochlorperazine (COMPAZINE) 10 MG tablet Take 1 tablet (10 mg total) by mouth every 6 (six) hours as needed for nausea or vomiting. 11/27/22  Yes Doreatha Massed, MD  sodium chloride flush (NS) 0.9 % SOLN 10 mLs by Intracatheter route every 7 (seven) days. Patient taking differently: 10 mLs by Intracatheter route daily. 10/20/22  Yes Doreatha Massed, MD  atorvastatin (LIPITOR) 40 MG tablet Take 1 tablet (40 mg total) by mouth daily at 6 PM. Patient not taking: Reported on 11/30/2022 12/05/21   Sande Rives, MD  CISplatin (PLATINOL) 50 MG/50ML SOLN Inject 50 mg into the vein once a week. On days 1 and 8, every 21 days    [provider]  durvalumab 1,500 mg in sodium chloride 0.9 % 100 mL Inject 1,500 mg into the vein every 21 ( twenty-one) days.    [provider]  gemcitabine 760 mg/m2 in sodium chloride 0.9 % 100 mL Inject 760 mg/m2 into the vein once a week. On days 1 and 8 every 21 days    [provider]  lidocaine (LIDODERM) 5 % Place 1 patch onto the skin daily.  Remove & Discard patch within 12 hours or as directed by MD Patient not taking: Reported on 11/30/2022 11/16/22   Joycelyn Das, MD  Misc. Devices KIT 1 Box by Does not apply route every 7 (seven) days. 10/20/22   Doreatha Massed, MD  oxyCODONE-acetaminophen (PERCOCET) 5-325 MG tablet Take 1 tablet by mouth every 4 (four) hours as needed for severe pain. Patient not taking: Reported on 11/30/2022 11/19/22 11/19/23  Malen Gauze, MD  Wound Dressings (INTRASITE GEL APPLIPAK) GEL Apply 1 Units topically daily. 10/05/22   Doreatha Massed, MD    Physical Exam: Vitals:   11/30/22 1032 11/30/22  1100 11/30/22 1130 11/30/22 1200  BP:  (!) 92/52 97/61 (!) 86/58  Pulse:  96 84 89  Resp:  Temp:      TempSrc:      SpO2: 99% 100% 98% 98%  Weight:      Height:       GENERAL:  A&O x 3, NAD, well developed, cooperative, follows commands HEENT: [place]/AT, No thrush, No icterus, No oral ulcers Neck:  No neck mass, No meningismus, soft, supple CV: RRR, no S3, no S4, no rub, no JVD Lungs:  bibasilar rales. No wheeze Abd: soft/NT +BS, nondistended Ext: No edema, no lymphangitis, no cyanosis, no rashes Neuro:  CN II-XII intact, strength 4/5 in RUE, RLE, strength 4/5 LUE, LLE; sensation intact bilateral; no dysmetria; babinski equivocal  Data Reviewed: Data reviewed above in the history  Assessment and Plan: Acute on chronic renal failure--CKD stage IIIb -Primarily due to volume depletion and hemodynamic changes -Patient is chronically hypotensive -Give albumin -Start IV fluids  Hyperkalemia -Lokelma and other temporizing measures given in the ED -Repeat BMP  Hypotension -Continue midodrine -Lactic acid 1.8 -Check procalcitonin -Blood cultures x 2 sets -UA and urine culture  Chronic indwelling Foley catheter -Placed on 11/19/2022 at the time of his cystoscopy and cystolitholapaxy  Metastatic cholangiocarcinoma -Status post portal vein embolization 06/2021 -Patient had a  plastic stent removed 11/09/2022 -He still has indwelling bare-metal stent which was patent on ERCP 11/09/2022 -Continue to follow Dr. Ellin Saba -Last paracentesis 11/24/2022 removing 5.5 L  Hyponatremia -Secondary to volume depletion and poor solute intake -Start IV normal saline  Chronic HFrEF -Clinically euvolemic -Holding diuretics -Holding carvedilol secondary to soft blood pressures -11/10/2022 echo EF 40 to 45%, grade 1 DD,+WMA  COPD -Stable on room air  Tobacco abuse, in remission -Patient has not smoked since discharge from the hospital on 11/16/2022  Coronary artery disease with history of MI -No chest pain presently -Holding carvedilol secondary to soft blood pressures  Impaired glucose tolerance -Allowing for liberal glycemic control at this point    Advance Care Planning: FULL  Consults: palliative  Family Communication: son updated 4/22  Severity of Illness: The appropriate patient status for this patient is INPATIENT. Inpatient status is judged to be reasonable and necessary in order to provide the required intensity of service to ensure the patient's safety. The patient's presenting symptoms, physical exam findings, and initial radiographic and laboratory data in the context of their chronic comorbidities is felt to place them at high risk for further clinical deterioration. Furthermore, it is not anticipated that the patient will be medically stable for discharge from the hospital within 2 midnights of admission.   * I certify that at the point of admission it is my clinical judgment that the patient will require inpatient hospital care spanning beyond 2 midnights from the point of admission due to high intensity of service, high risk for further deterioration and high frequency of surveillance required.*  Author: Catarina Hartshorn, MD 11/30/2022 1:00 PM  For on call review [email or web address].

## 2022-11-30 NOTE — Telephone Encounter (Signed)
Pt c/o BP issue: STAT if pt c/o blurred vision, one-sided weakness or slurred speech  1. What are your last 5 BP readings?  4/22: 77/54 (sitting)  2. Are you having any other symptoms (ex. Dizziness, headache, blurred vision, passed out)?  Dizziness   3. What is your BP issue?  Patient's wife states BP has been low. She also mentions that patient's carvedilol was increased to 3.125 twice daily during recent hospitalization.

## 2022-11-30 NOTE — Progress Notes (Signed)
Hold treatment today due to elevated potassium.  Patient will be transported to the ED per Dr. Ellin Saba.  PICC line dressing changed today.  Antimicrobial disc in place.  Cap changed.  PICC flushed with 10 mL of NS and 2.5 mL of Heparin.  Good blood return noted.  Patient left via wheelchair to the ED with wife and [facility] nurse in stable condition.

## 2022-11-30 NOTE — Telephone Encounter (Signed)
Patient medication changed on 3/17 to Coreg 3.125 BID.   Please advise.

## 2022-11-30 NOTE — Telephone Encounter (Signed)
Noted, patient being evaluated in ED.

## 2022-11-30 NOTE — ED Triage Notes (Signed)
Pt from Cancer center. Pt was admitted recently for sepsis d/t a biliary drain. Pt came in today for labs which came back abnormal. Per cancer center pt has elevated k+ and creatine. PA at bedside for MSE.

## 2022-11-30 NOTE — Patient Instructions (Signed)
[facility] [place]  Discharge Instructions: Thank you for choosing [facility] to provide your oncology and hematology care.  If you have a lab appointment with the Cancer Center - please note that after April 8th, 2024, all labs will be drawn in the cancer center.  You do not have to check in or register with the main entrance as you have in the past but will complete your check-in in the cancer center.  Wear comfortable clothing and clothing appropriate for easy access to any Portacath or PICC line.   We strive to give you quality time with your provider. You may need to reschedule your appointment if you arrive late (15 or more minutes).  Arriving late affects you and other patients whose appointments are after yours.  Also, if you miss three or more appointments without notifying the office, you may be dismissed from the clinic at the provider's discretion.      For prescription refill requests, have your pharmacy contact our office and allow 72 hours for refills to be completed.  To help prevent nausea and vomiting after your treatment, we encourage you to take your nausea medication as directed.  BELOW ARE SYMPTOMS THAT SHOULD BE REPORTED IMMEDIATELY: *FEVER GREATER THAN 100.4 F (38 C) OR HIGHER *CHILLS OR SWEATING *NAUSEA AND VOMITING THAT IS NOT CONTROLLED WITH YOUR NAUSEA MEDICATION *UNUSUAL SHORTNESS OF BREATH *UNUSUAL BRUISING OR BLEEDING *URINARY PROBLEMS (pain or burning when urinating, or frequent urination) *BOWEL PROBLEMS (unusual diarrhea, constipation, pain near the anus) TENDERNESS IN MOUTH AND THROAT WITH OR WITHOUT PRESENCE OF ULCERS (sore throat, sores in mouth, or a toothache) UNUSUAL RASH, SWELLING OR PAIN  UNUSUAL VAGINAL DISCHARGE OR ITCHING   Items with * indicate a potential emergency and should be followed up as soon as possible or go to the Emergency Department if any problems should occur.  Please show the CHEMOTHERAPY ALERT CARD or  IMMUNOTHERAPY ALERT CARD at check-in to the Emergency Department and triage nurse.  Should you have questions after your visit or need to cancel or reschedule your appointment, please contact [facility] [place] [phone]  and follow the prompts.  Office hours are 8:00 a.m. to 4:30 p.m. Monday - Friday. Please note that voicemails left after 4:00 p.m. may not be returned until the following business day.  We are closed weekends and major holidays. You have access to a nurse at all times for urgent questions. Please call the main number to the clinic [phone] and follow the prompts.  For any non-urgent questions, you may also contact your provider using MyChart. We now offer e-Visits for anyone 18 and older to request care online for non-urgent symptoms. For details visit [email or web address].   Also download the MyChart app! Go to the app store, search "MyChart", open the app, select [place], and log in with your MyChart username and password.   

## 2022-11-30 NOTE — Consult Note (Signed)
Consultation Note Date: 11/30/2022   Patient Name: Jonathan Keith  DOB: Jan 27, 1956  MRN: [id number]  Age / Sex: 67 y.o., male  PCP: Jonathan Nevins, MD Referring Physician: Catarina Hartshorn, MD  Reason for Consultation: Establishing goals of care  HPI/Patient Profile: 67 y.o. male  with past medical history of metastatic cholangiocarcinoma, coronary artery disease with STEMI February 2023, HFrEF, COPD, tobacco abuse, diabetes mellitus type 2, hyperlipidemia, and CKD stage IIIb  admitted on 11/30/2022 with acute on chronic renal failure, hyperkalemia, low albumin.   Clinical Assessment and Goals of Care: I have reviewed medical records including EPIC notes, labs and imaging, received report from RN, assessed the patient.  [name] is lying quietly in bed.  He appears acutely/chronically ill and quite frail.  He is alert, making but not keeping eye contact.  He is oriented to person place and situation, able to make his needs known.  His wife of 48 years, Jonathan Keith, is present at bedside.  We meet at the bedside to discuss diagnosis prognosis, GOC, EOL wishes, disposition and options.  I introduced Palliative Medicine as specialized medical care for people living with serious illness. It focuses on providing relief from the symptoms and stress of a serious illness. The goal is to improve quality of life for both the patient and the family.  We discussed a brief life review of the patient.  Mr. Mrs. Trostel have been married for 48 years, October.  They have 2 children, a boy and a girl.  He worked at [employer] until he was forced to retire due to cancer.  We then focused on their current illness.  Mrs. Plotkin is very knowledgeable about Mr. Bordner chronic health burden over the last few years since diagnosed with cancer.  We talk about Mr. Dimperio acute health concerns including, but not  limited to, low albumin and receiving albumin, high potassium, time for outcomes.  The natural disease trajectory and expectations at EOL were discussed.  Advanced directives, concepts specific to code status, artifical feeding and hydration, and rehospitalization were considered and discussed.  We talked about the concept of "treat the treatable, but allowing natural passing".  At this point Mr. Rineer states that he would want patient.  We talk about the attempt for resuscitation.  I encouraged Mr. Suazo to think about what he does and does not want.  I encouraged him to consider how long he would accept life support.  When I introduced myself as a palliative team.  Mrs. Pile states that she knows about palliative medicine.  She shares that her aunt took palliative and hospice care for her uncle.  We talked about the difference in inpatient versus outpatient palliative care palliative Care services outpatient were explained.  Discussed the importance of continued conversation with family and the medical providers regarding overall plan of care and treatment options, ensuring decisions are within the context of the patient's values and GOCs.  Questions and concerns were addressed.  Hard Choices booklet left for review.  The family was encouraged to call with questions or concerns.  PMT will continue to support holistically.  Conference with attending, bedside nursing staff, transition of care team related to patient condition, needs, goals of care, disposition.   HCPOA NEXT OF KIN -wife of 48 years, Jonathan Keith.    SUMMARY OF RECOMMENDATIONS   Continue full scope/full code Time for outcomes. PMT to continue to follow   Code Status/Advance Care Planning: Full code - We talked about the concept of "treat the treatable, but allowing natural passing".  At this point Mr. Shankland states that he would want patient.  We talk about the attempt for resuscitation.  I encouraged Mr. Reither to  think about what he does and does not want.  I encouraged him to consider how long he would accept life support.  Symptom Management:  Per hospitalist, no additional needs at this time.  Palliative Prophylaxis:  Frequent Pain Assessment and Oral Care  Additional Recommendations (Limitations, Scope, Preferences): Full Scope Treatment  Psycho-social/Spiritual:  Desire for further Chaplaincy support:no Additional Recommendations: Caregiving  Support/Resources and Education on Hospice  Prognosis:  Unable to determine, based on outcomes.  Guarded at this point.  Discharge Planning: To Be Determined      Primary Diagnoses: Present on Admission:  Acute renal failure superimposed on stage 3b chronic kidney disease, unspecified acute renal failure type  Tobacco dependence  Transaminitis  Cholangiocarcinoma  Chronic HFrEF (heart failure with reduced ejection fraction)   I have reviewed the medical record, interviewed the patient and family, and examined the patient. The following aspects are pertinent.  Past Medical History:  Diagnosis Date   Cancer    CHF (congestive heart failure)    Chronic pain    neck, back, knees   Class 1 obesity due to excess calories with body mass index (BMI) of 31.0 to 31.9 in adult    Claudication    Coronary artery disease    DDD (degenerative disc disease), cervical    Diabetes mellitus    Dyslipidemia    Hypertension    Port-A-Cath in place 07/20/2021   Tobacco dependence    Social History   Socioeconomic History   Marital status: Married    Spouse name: Not on file   Number of children: Not on file   Years of education: Not on file   Highest education level: Not on file  Occupational History   Occupation: Heavy Arboriculturist  Tobacco Use   Smoking status: Every Day    Packs/day: 0.50    Years: 56.00    Additional pack years: 0.00    Total pack years: 28.00    Types: Cigarettes   Smokeless tobacco: Never  Vaping Use    Vaping Use: Never used  Substance and Sexual Activity   Alcohol use: Not Currently    Comment: stopped 2.5 years ago 03/11/21   Drug use: Never   Sexual activity: Not on file  Other Topics Concern   Not on file  Social History Narrative   Not on file   Social Determinants of Health   Financial Resource Strain: Medium Risk (03/07/2021)   Overall Financial Resource Strain (CARDIA)    Difficulty of Paying Living Expenses: Somewhat hard  Food Insecurity: No Food Insecurity (11/14/2022)   Hunger Vital Sign    Worried About Running Out of Food in the Last Year: Never true    Ran Out of Food in the Last Year: Never true  Transportation Needs: No Transportation Needs (11/14/2022)  PRAPARE - Administrator, Civil Service (Medical): No    Lack of Transportation (Non-Medical): No  Physical Activity: Insufficiently Active (03/07/2021)   Exercise Vital Sign    Days of Exercise per Week: 2 days    Minutes of Exercise per Session: 20 min  Stress: No Stress Concern Present (03/07/2021)   [employer] of Occupational Health - Occupational Stress Questionnaire    Feeling of Stress : Only a little  Social Connections: Moderately Integrated (03/07/2021)   Social Connection and Isolation Panel [NHANES]    Frequency of Communication with Friends and Family: More than three times a week    Frequency of Social Gatherings with Friends and Family: More than three times a week    Attends Religious Services: More than 4 times per year    Active Member of [employer] or Organizations: No    Attends Engineer, structural: Never    Marital Status: Married   Family History  Problem Relation Age of Onset   CAD Mother    Cancer Neg Hx    Scheduled Meds:  [START ON 12/01/2022] alfuzosin  10 mg Oral Q breakfast   heparin  5,000 Units Subcutaneous Q8H   midodrine  5 mg Oral TID WC   pantoprazole (PROTONIX) IV  40 mg Intravenous Q12H   Continuous Infusions:  sodium chloride     albumin human      PRN Meds:.acetaminophen **OR** acetaminophen, ondansetron **OR** ondansetron (ZOFRAN) IV Medications Prior to Admission:  Prior to Admission medications   Medication Sig Start Date End Date Taking? Authorizing Provider  acetaminophen (TYLENOL) 500 MG tablet Take 500 mg by mouth every 6 (six) hours as needed for moderate pain or headache.   Yes [provider]  albuterol (VENTOLIN HFA) 108 (90 Base) MCG/ACT inhaler Inhale 2 puffs into the lungs every 6 (six) hours as needed for wheezing or shortness of breath. 11/29/21  Yes Tat, Onalee Hua, MD  alfuzosin (UROXATRAL) 10 MG 24 hr tablet Take 1 tablet (10 mg total) by mouth at bedtime. 08/21/22  Yes McKenzie, Mardene Celeste, MD  carvedilol (COREG) 3.125 MG tablet Take 3.125 mg by mouth 2 (two) times daily with a meal.   Yes [provider]  famotidine (PEPCID) 20 MG tablet Take 20 mg by mouth 2 (two) times daily.   Yes [provider]  ferrous sulfate (FERROUSUL) 325 (65 FE) MG tablet Take 1 tablet (325 mg total) by mouth daily with breakfast. 03/18/21  Yes Rehman, Joline Maxcy, MD  guaifenesin (ROBITUSSIN) 100 MG/5ML syrup Take 200 mg by mouth 3 (three) times daily as needed for cough.   Yes [provider]  metFORMIN (GLUCOPHAGE) 500 MG tablet Take 500 mg by mouth 2 (two) times daily with a meal.   Yes [provider]  midodrine (PROAMATINE) 5 MG tablet Take 1 tablet (5 mg total) by mouth 3 (three) times daily with meals. 11/27/22  Yes Doreatha Massed, MD  Multiple Vitamin (MULTIVITAMIN WITH MINERALS) TABS tablet Take 1 tablet by mouth daily. 11/17/22 02/25/23 Yes Pokhrel, Rebekah Chesterfield, MD  nitroGLYCERIN (NITROSTAT) 0.4 MG SL tablet Place 1 tablet (0.4 mg total) under the tongue every 5 (five) minutes as needed for chest pain. 09/12/21 11/30/22 Yes Duke, Roe Rutherford, PA  ondansetron (ZOFRAN) 4 MG tablet Take 1 tablet (4 mg total) by mouth every 6 (six) hours as needed for nausea. 11/16/22  Yes Pokhrel, Laxman, MD  pantoprazole  (PROTONIX) 40 MG tablet Take 1 tablet (40 mg total) by mouth  daily. 12/05/21  Yes O'Neal, Ronnald Ramp, MD  prochlorperazine (COMPAZINE) 10 MG tablet Take 1 tablet (10 mg total) by mouth every 6 (six) hours as needed for nausea or vomiting. 11/27/22  Yes Doreatha Massed, MD  sodium chloride flush (NS) 0.9 % SOLN 10 mLs by Intracatheter route every 7 (seven) days. Patient taking differently: 10 mLs by Intracatheter route daily. 10/20/22  Yes Doreatha Massed, MD  atorvastatin (LIPITOR) 40 MG tablet Take 1 tablet (40 mg total) by mouth daily at 6 PM. Patient not taking: Reported on 11/30/2022 12/05/21   Sande Rives, MD  CISplatin (PLATINOL) 50 MG/50ML SOLN Inject 50 mg into the vein once a week. On days 1 and 8, every 21 days    [provider]  durvalumab 1,500 mg in sodium chloride 0.9 % 100 mL Inject 1,500 mg into the vein every 21 ( twenty-one) days.    [provider]  gemcitabine 760 mg/m2 in sodium chloride 0.9 % 100 mL Inject 760 mg/m2 into the vein once a week. On days 1 and 8 every 21 days    [provider]  lidocaine (LIDODERM) 5 % Place 1 patch onto the skin daily. Remove & Discard patch within 12 hours or as directed by MD Patient not taking: Reported on 11/30/2022 11/16/22   Joycelyn Das, MD  Misc. Devices KIT 1 Box by Does not apply route every 7 (seven) days. 10/20/22   Doreatha Massed, MD  oxyCODONE-acetaminophen (PERCOCET) 5-325 MG tablet Take 1 tablet by mouth every 4 (four) hours as needed for severe pain. Patient not taking: Reported on 11/30/2022 11/19/22 11/19/23  Malen Gauze, MD  Wound Dressings (INTRASITE GEL APPLIPAK) GEL Apply 1 Units topically daily. 10/05/22   Doreatha Massed, MD   No Known Allergies Review of Systems  Unable to perform ROS: Acuity of condition    Physical Exam Vitals and nursing note reviewed.  Constitutional:      General: He is not in acute distress.    Appearance: He is ill-appearing.   HENT:     Head:     Comments: Temporal wasting    Mouth/Throat:     Mouth: Mucous membranes are moist.  Pulmonary:     Effort: Pulmonary effort is normal. No respiratory distress.  Abdominal:     General: There is distension.     Tenderness: There is abdominal tenderness.  Musculoskeletal:        General: Swelling present.  Skin:    General: Skin is warm and dry.     Coloration: Skin is jaundiced.  Neurological:     Mental Status: He is alert and oriented to person, place, and time.  Psychiatric:        Mood and Affect: Mood normal.        Behavior: Behavior normal.     Vital Signs: BP 96/64 (BP Location: Left Arm)   Pulse 89   Temp (!) 97.4 F (36.3 C) (Oral)   Resp 14   Ht  (1.803 m)   Wt 73.6 kg   SpO2 100%   BMI 22.63 kg/m  Pain Scale: 0-10   Pain Score: 0-No pain   SpO2: SpO2: 100 % O2 Device:SpO2: 100 % O2 Flow Rate: .   IO: Intake/output summary: No intake or output data in the 24 hours ending 11/30/22 1518  LBM:   Baseline Weight: Weight: 69.9 kg Most recent weight: Weight: 73.6 kg     Palliative Assessment/Data:     Time In: 1430  Time Out: 1545 Time Total: 75 minutes Greater than 50%  of this time was spent counseling and coordinating care related to the above assessment and plan.  Signed by: Katheran Awe, NP   Please contact Palliative Medicine Team phone at [phone] for questions and concerns.  For individual provider: See Loretha Stapler

## 2022-11-30 NOTE — Telephone Encounter (Signed)
Patient wife called to say patient is currently in the emergency room.

## 2022-12-01 ENCOUNTER — Other Ambulatory Visit: Payer: Self-pay

## 2022-12-01 ENCOUNTER — Inpatient Hospital Stay (HOSPITAL_COMMUNITY): Payer: Medicare Other

## 2022-12-01 DIAGNOSIS — R180 Malignant ascites: Secondary | ICD-10-CM

## 2022-12-01 DIAGNOSIS — F17200 Nicotine dependence, unspecified, uncomplicated: Secondary | ICD-10-CM | POA: Diagnosis not present

## 2022-12-01 DIAGNOSIS — R7401 Elevation of levels of liver transaminase levels: Secondary | ICD-10-CM | POA: Diagnosis not present

## 2022-12-01 DIAGNOSIS — I5022 Chronic systolic (congestive) heart failure: Secondary | ICD-10-CM | POA: Diagnosis not present

## 2022-12-01 DIAGNOSIS — C221 Intrahepatic bile duct carcinoma: Secondary | ICD-10-CM

## 2022-12-01 DIAGNOSIS — N179 Acute kidney failure, unspecified: Secondary | ICD-10-CM | POA: Diagnosis not present

## 2022-12-01 LAB — COMPREHENSIVE METABOLIC PANEL
ALT: 66 U/L — ABNORMAL HIGH (ref 0–44)
AST: 101 U/L — ABNORMAL HIGH (ref 15–41)
Albumin: 2.4 g/dL — ABNORMAL LOW (ref 3.5–5.0)
Alkaline Phosphatase: 438 U/L — ABNORMAL HIGH (ref 38–126)
Anion gap: 8 (ref 5–15)
BUN: 69 mg/dL — ABNORMAL HIGH (ref 8–23)
CO2: 16 mmol/L — ABNORMAL LOW (ref 22–32)
Calcium: 8.2 mg/dL — ABNORMAL LOW (ref 8.9–10.3)
Chloride: 103 mmol/L (ref 98–111)
Creatinine, Ser: 2.98 mg/dL — ABNORMAL HIGH (ref 0.61–1.24)
GFR, Estimated: 22 mL/min — ABNORMAL LOW (ref >60)
Glucose, Bld: 118 mg/dL — ABNORMAL HIGH (ref 70–99)
Potassium: 5.3 mmol/L — ABNORMAL HIGH (ref 3.5–5.1)
Sodium: 127 mmol/L — ABNORMAL LOW (ref 135–145)
Total Bilirubin: 3.1 mg/dL — ABNORMAL HIGH (ref 0.3–1.2)
Total Protein: 5.7 g/dL — ABNORMAL LOW (ref 6.5–8.1)

## 2022-12-01 LAB — CBC
HCT: 24.3 % — ABNORMAL LOW (ref 39.0–52.0)
Hemoglobin: 8.1 g/dL — ABNORMAL LOW (ref 13.0–17.0)
MCH: 33.2 pg (ref 26.0–34.0)
MCHC: 33.3 g/dL (ref 30.0–36.0)
MCV: 99.6 fL (ref 80.0–100.0)
Platelets: 105 10*3/uL — ABNORMAL LOW (ref 150–400)
RBC: 2.44 MIL/uL — ABNORMAL LOW (ref 4.22–5.81)
RDW: 17.1 % — ABNORMAL HIGH (ref 11.5–15.5)
WBC: 7.7 10*3/uL (ref 4.0–10.5)
nRBC: 0 % (ref 0.0–0.2)

## 2022-12-01 LAB — URINE CULTURE: Culture: NO GROWTH

## 2022-12-01 LAB — CULTURE, BLOOD (ROUTINE X 2): Culture: NO GROWTH

## 2022-12-01 MED ORDER — ONDANSETRON HCL 4 MG/2ML IJ SOLN
4.0000 mg | Freq: Four times a day (QID) | INTRAMUSCULAR | Status: DC
  Administered 2022-12-02 (×4): 4 mg via INTRAVENOUS
  Filled 2022-12-01 (×5): qty 2

## 2022-12-01 MED ORDER — SODIUM ZIRCONIUM CYCLOSILICATE 10 G PO PACK
10.0000 g | PACK | Freq: Once | ORAL | Status: AC
  Administered 2022-12-01: 10 g via ORAL
  Filled 2022-12-01: qty 1

## 2022-12-01 MED ORDER — SODIUM CHLORIDE 0.9 % IV SOLN: INTRAVENOUS | Status: AC

## 2022-12-01 MED ORDER — METOCLOPRAMIDE HCL 5 MG/ML IJ SOLN
5.0000 mg | Freq: Four times a day (QID) | INTRAMUSCULAR | Status: AC
  Administered 2022-12-01 – 2022-12-02 (×4): 5 mg via INTRAVENOUS
  Filled 2022-12-01 (×4): qty 2

## 2022-12-01 NOTE — Progress Notes (Signed)
Palliative:   Face-to-face conference with bedside nursing staff. I opened the door to find Jonathan Keith surrounded by his loving family with bedside nursing staff present attending to needs.  Family request that we visit at another time.  Conference with attending, bedside nursing staff, transition of care team related to patient condition, needs, goals of care, disposition.  Plan: At this point continue full scope/full code.  Time for outcomes.    No charge Lillia Carmel, NP Palliative medicine team Team phone [phone] Greater than 50% of this time was spent counseling and coordinating care related to the above assessment and plan.

## 2022-12-01 NOTE — TOC Initial Note (Signed)
Transition of Care [facility]) - Initial/Assessment Note    Patient Details  Name: Jonathan Keith MRN: [id number] Date of Birth: January 11, 1956  Transition of Care [facility]) CM/SW Contact:    Karn Cassis, LCSW Phone Number: 12/01/2022, 8:29 AM  Clinical Narrative:   Pt admitted for acute on chronic renal failure. Assessment completed with pt's wife due to high risk readmission score. Pt's wife indicates she helps pt with ADLs as needed. He has been ambulating with a walker recently. Pt is active with [facility] RN. Morrie Sheldon with [facility] notified of admission. Pt's wife or children provide transport to appointments. Plan is to return home when medically stable. Palliative following. No needs reported at this time. TOC will continue to follow.               Expected Discharge Plan: Home w Home Health Services Barriers to Discharge: Continued Medical Work up   Patient Goals and CMS Choice Patient states their goals for this hospitalization and ongoing recovery are:: return home   Choice offered to / list presented to : Spouse [place] ownership interest in [facility].provided to::  (n/a)    Expected Discharge Plan and Services In-house Referral: Clinical Social Work   Post Acute Care Choice: Resumption of Svcs/PTA Provider Living arrangements for the past 2 months: Single Family Home                           HH Arranged: RN HH Agency: [facility] Date HH Agency Contacted: 12/01/22 Time HH Agency Contacted: [phone] Representative spoke with at [facility] Agency: Morrie Sheldon  Prior Living Arrangements/Services Living arrangements for the past 2 months: Single Family Home Lives with:: Spouse Patient language and need for interpreter reviewed:: Yes Do you feel safe going back to the place where you live?: Yes      Need for Family Participation in Patient Care: Yes (Comment) Care giver support system in place?: Yes (comment) Current home services: DME, Home RN (cane,  walker, hospital bed, BSC, shower chair, wheelchair) Criminal Activity/Legal Involvement Pertinent to Current Situation/Hospitalization: No - Comment as needed  Activities of Daily Living Home Assistive Devices/Equipment: Environmental consultant (specify type), Wheelchair, Shower chair with back, Medical laboratory scientific officer (specify quad or straight) ADL Screening (condition at time of admission) Patient's cognitive ability adequate to safely complete daily activities?: Yes Is the patient deaf or have difficulty hearing?: No Does the patient have difficulty seeing, even when wearing glasses/contacts?: No Does the patient have difficulty concentrating, remembering, or making decisions?: No Patient able to express need for assistance with ADLs?: Yes Does the patient have difficulty dressing or bathing?: No Independently performs ADLs?: No Communication: Independent Dressing (OT): Independent Grooming: Needs assistance Is this a change from baseline?: Pre-admission baseline Feeding: Independent Bathing: Needs assistance Is this a change from baseline?: Pre-admission baseline Toileting: Independent In/Out Bed: Independent Walks in Home: Independent Does the patient have difficulty walking or climbing stairs?: No Weakness of Legs: Both Weakness of Arms/Hands: Both  Permission Sought/Granted                  Emotional Assessment       Orientation: : Oriented to Self, Oriented to Place, Oriented to  Time, Oriented to Situation Alcohol / Substance Use: Not Applicable Psych Involvement: No (comment)  Admission diagnosis:  Acute renal failure superimposed on stage 3b chronic kidney disease, unspecified acute renal failure type [N17.9, N18.32] Patient Active Problem List   Diagnosis Date Noted  Acute renal failure superimposed on stage 3b chronic kidney disease, unspecified acute renal failure type 11/30/2022   Hypotension 11/30/2022   Bile duct stricture 11/09/2022   Gram-negative bacteremia 11/08/2022    Cholangitis 11/08/2022   Metastatic cholangiocarcinoma to bile duct 11/07/2022   Lactic acidosis 11/07/2022   Prediabetes 11/07/2022   Malignant ascites 11/07/2022   Abdominal distention 11/07/2022   History of ERCP 10/24/2022   History of biliary duct stent placement 10/24/2022   Abnormal LFTs 10/24/2022   Biliary obstruction 10/24/2022   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 10/21/2022   Chronic kidney disease, stage 3a 10/21/2022   Anemia of chronic disease 10/21/2022   Ischemic cardiomyopathy 09/11/2022   Acute kidney injury superimposed on chronic kidney disease 09/11/2022   Lobar pneumonia 11/28/2021   Chronic HFrEF (heart failure with reduced ejection fraction) 11/27/2021   Coronary artery disease 11/27/2021   Tobacco abuse 11/27/2021   Acute respiratory failure with hypoxia 11/27/2021   Elevated troponin 11/27/2021   NSTEMI (non-ST elevated myocardial infarction) 09/08/2021   ACS (acute coronary syndrome) 09/08/2021   Nausea and vomiting 09/01/2021   Dehydration 07/23/2021   Port-A-Cath in place 07/20/2021   Atherosclerosis of native arteries of extremities with intermittent claudication, bilateral legs 06/30/2021   Abnormal levels of other serum enzymes 06/30/2021   Nicotine dependence, cigarettes, uncomplicated 06/30/2021   Biliary tract cancer 06/12/2021   Hyperbilirubinemia    Sepsis 05/25/2021   SIRS (systemic inflammatory response syndrome) 04/28/2021   Cholangiocarcinoma 04/10/2021   Calculus of bile duct without cholangitis or cholecystitis without obstruction 04/10/2021   Acute febrile illness 03/12/2021   Acute cholecystitis 03/12/2021   Diverticulitis 03/12/2021   Transaminitis 03/12/2021   Leukocytosis 03/12/2021   Hyponatremia 03/12/2021   Hyperglycemia due to diabetes mellitus 03/12/2021   GERD (gastroesophageal reflux disease) 03/12/2021   Hypoalbuminemia due to protein-calorie malnutrition 03/12/2021   Type 2 diabetes mellitus without complications  03/12/2021   Gastroesophageal reflux disease 03/12/2021   Elevated LFTs    Choledocholithiasis    Obstructive jaundice 02/24/2021   Class 1 obesity due to excess calories with body mass index (BMI) of 31.0 to 31.9 in adult 02/24/2021   Controlled diabetes mellitus type 2 with complications 02/24/2021   Mixed hyperlipidemia 02/24/2021   Hypertension 02/24/2021   Tobacco dependence 02/24/2021   Elevated blood-pressure reading, without diagnosis of hypertension 02/24/2021   PCP:  Elfredia Nevins, MD Pharmacy:   [facility] [place], [place] - [address] [place] [address] Phone: [phone] Fax: [phone]  [id number] - [place], [place] - [address] AT [facility] [address] [place] [address] Phone: [phone] Fax: [phone]     Social Determinants of Health (SDOH) Social History: SDOH Screenings   Food Insecurity: No Food Insecurity (11/30/2022)  Housing: Low Risk  (11/30/2022)  Transportation Needs: No Transportation Needs (11/30/2022)  Utilities: Not At Risk (11/30/2022)  Alcohol Screen: Low Risk  (03/07/2021)  Depression (PHQ2-9): Low Risk  (03/07/2021)  Financial Resource Strain: Medium Risk (03/07/2021)  Physical Activity: Insufficiently Active (03/07/2021)  Social Connections: Moderately Integrated (03/07/2021)  Stress: No Stress Concern Present (03/07/2021)  Tobacco Use: High Risk (11/30/2022)   SDOH Interventions:     Readmission Risk Interventions    12/01/2022    8:25 AM 11/12/2022    2:39 PM 10/23/2022    2:35 PM  Readmission Risk Prevention Plan  Transportation Screening Complete Complete Complete  PCP or Specialist Appt within 3-5 Days  Complete Complete  HRI or  Home Care Consult  Complete Complete  Social Work Consult for Recovery Care Planning/Counseling  Complete Complete  Palliative Care Screening  Complete Complete  Medication Review Oceanographer) Complete Complete Complete  HRI or Home Care  Consult Complete    SW Recovery Care/Counseling Consult Complete    Palliative Care Screening Complete    Skilled Nursing Facility Not Applicable

## 2022-12-01 NOTE — Progress Notes (Addendum)
PROGRESS NOTE  MEDHANSH BRINKMEIER ZHY:[id number] DOB: 1956-01-19 DOA: 11/30/2022 PCP: Elfredia Nevins, MD  Brief History:  67 year old male with a history of metastatic cholangiocarcinoma, coronary artery disease with STEMI February 2023, HFrEF, COPD, tobacco abuse, diabetes mellitus type 2, hyperlipidemia, and CKD stage IIIb presenting from [facility] secondary to abnormal labs.  The patient had his routine appointment for chemotherapy on 11/30/2022.  Labs were done routinely and noted sodium 125, potassium 6.8, serum creatinine 3.00.  AST 125, ALT 76, total bilirubin 3.6.  WBC 12.4, hemoglobin 9.9, platelets 156.  The patient was sent to the emergency department for further evaluation and treatment for his lab abnormalities.  Notably, the patient was recently hospitalized from 11/07/2022 to 11/16/2022 for sepsis secondary to Enterococcus faecalis bacteremia.  He was treated with Unasyn and subsequently transition to oral amoxicillin which he finished on 11/22/2022.  During that hospitalization, the patient had ERCP on 11/09/2022 with removal of his plastic stent. ERCP 4/1 showed partial occlusion of plastic biliary stent that went from CBD to left main hepatic duct, removed.  Previously placed uncovered metal stent was patent.  Biliary tree swept of sludge. -Repeat CT 4/4 shows similar intrahepatic bile duct dilation with metal biliary stent in place, moderate volume ascites and diffuse body wall edema  Since discharge from the hospital, the patient underwent cystoscopy with cystolitholopaxy performed by Dr. Ronne Binning on 11/19/2022.  Foley catheter was placed after the procedure.  The patient's son at the bedside supplements the history.  The patient has had increasing generalized weakness for the past week.  He has had some dizziness with low BPs for the past week.  He is having some intermittent nausea and vomiting primarily with food.  There is no hematemesis, diarrhea, hematochezia, melena.   He has not any fevers or chills.  There is no chest pain, shortness breath, cough, hemoptysis.  He denies any headache or neck pain, abdominal pain,  In the ED, the patient was afebrile with soft blood pressure.  Oxygen saturation was 100% room air.  Labs are discussed above. Lactic acid 1.8.  AST 125, ALT at 76, alkaline phosphatase 518, total bilirubin 3.6. I have placed orders for blood cultures and urine cultures to be collected in the emergency department.   Assessment/Plan:  Acute on chronic renal failure--CKD stage IIIb -Primarily due to volume depletion and hemodynamic changes -Patient is chronically hypotensive -Given albumin x 4 -continue IV fluids judiciously -repeat albumin 4/23   Hyperkalemia -Lokelma and other temporizing measures given in the ED -4/23--redose Lokelma -Repeat BMP   Hypotension -Continue midodrine -Lactic acid 1.8 -Check procalcitonin 2.88 -Blood cultures x 2 sets--neg to date -UA --no pyuria -continue empiric zosyn for now -albumin as discussed above -continue judicious IVF  Hyponatremia -serum osm 287 -urine osm 475 -urine Na <10 -volume depletion, poor solute intake and a degree of SIADH   Chronic indwelling Foley catheter -Placed on 11/19/2022 at the time of his cystoscopy and cystolitholapaxy   Metastatic cholangiocarcinoma -Status post portal vein embolization 06/2021 -Patient had a plastic stent removed 11/09/2022 -He still has indwelling bare-metal stent which was patent on ERCP 11/09/2022 -Continue to follow Dr. Ellin Saba -Last paracentesis 11/24/2022 removing 5.5 L -LFTs and bili trending down   Hyponatremia -Secondary to volume depletion and poor solute intake -Start IV normal saline   Chronic HFrEF -Clinically euvolemic -Holding diuretics -Holding carvedilol secondary to soft blood pressures -11/10/2022 echo EF 40 to 45%, grade 1  DD,+WMA   COPD -Stable on room air   Tobacco abuse, in remission -Patient has not smoked  since discharge from the hospital on 11/16/2022   Coronary artery disease with history of MI -No chest pain presently -Holding carvedilol secondary to soft blood pressures   Impaired glucose tolerance -Allowing for liberal glycemic control at this point  Goals of Care -discussed with son, spouse and patient  -continue full scope of care    Family Communication:   spouse and son at bedside 4/23  Consultants:  palliative  Code Status:  FULL  DVT Prophylaxis:  [place] Heparin    Procedures: As Listed in Progress Note Above  Antibiotics: Zosyn 4/23>>     Subjective: Pt feels nauseous.  Denies f/c, cp, sob.  Has some abdominal discomfort  Objective: Vitals:   11/30/22 1811 11/30/22 2045 12/01/22 0433 12/01/22 1353  BP: 99/66 97/64 102/65 93/61  Pulse: 88 83 86 86  Resp:  18 20 14   Temp: 97.7 F (36.5 C) (!) 97.5 F (36.4 C) 98.2 F (36.8 C) 98.1 F (36.7 C)  TempSrc: Oral Oral Oral Oral  SpO2: 98% 99% 99% 98%  Weight:      Height:        Intake/Output Summary (Last 24 hours) at 12/01/2022 1356 Last data filed at 12/01/2022 1610 Gross per 24 hour  Intake 1203.72 ml  Output --  Net 1203.72 ml   Weight change:  Exam:  General:  Pt is alert, follows commands appropriately, not in acute distress HEENT: No icterus, No thrush, No neck mass, [place]/AT Cardiovascular: RRR, S1/S2, no rubs, no gallops Respiratory: bibasilar rales.  Diminished BS Abdomen: Soft/+BS, non tender, non distended, no guarding Extremities: No edema, No lymphangitis, No petechiae, No rashes, no synovitis   Data Reviewed: I have personally reviewed following labs and imaging studies Basic Metabolic Panel: Recent Labs  Lab 11/30/22 0745 11/30/22 1000 11/30/22 1818 12/01/22 0441  NA 124* 125* 126* 127*  K 6.4* 6.8* 5.8* 5.3*  CL 99 100 102 103  CO2 16* 16* 17* 16*  GLUCOSE 135* 131* 133* 118*  BUN 69* 70* 69* 69*  CREATININE 2.98* 3.04* 3.00* 2.98*  CALCIUM 8.3* 8.3* 8.1* 8.2*  MG 2.0   --   --   --    Liver Function Tests: Recent Labs  Lab 11/30/22 0745 11/30/22 1000 12/01/22 0441  AST 133* 125* 101*  ALT 80* 76* 66*  ALKPHOS 551* 518* 438*  BILITOT 3.7* 3.6* 3.1*  PROT 6.0* 5.7* 5.7*  ALBUMIN 2.0* 1.9* 2.4*   Recent Labs  Lab 11/30/22 1000  LIPASE 71*   Recent Labs  Lab 11/30/22 1000  AMMONIA 78*   Coagulation Profile: Recent Labs  Lab 11/30/22 1000  INR 1.3*   CBC: Recent Labs  Lab 11/30/22 0745 11/30/22 1000 12/01/22 0441  WBC 12.4* 11.3* 7.7  NEUTROABS 9.5* 9.3*  --   HGB 9.9* 9.5* 8.1*  HCT 29.1* 28.3* 24.3*  MCV 98.6 98.6 99.6  PLT 156 137* 105*   Cardiac Enzymes: No results for input(s): "CKTOTAL", "CKMB", "CKMBINDEX", "TROPONINI" in the last 168 hours. BNP: Invalid input(s): "POCBNP" CBG: No results for input(s): "GLUCAP" in the last 168 hours. HbA1C: No results for input(s): "HGBA1C" in the last 72 hours. Urine analysis:    Component Value Date/Time   COLORURINE AMBER (A) 11/30/2022 1740   APPEARANCEUR HAZY (A) 11/30/2022 1740   APPEARANCEUR Clear 10/28/2022 1120   LABSPEC 1.016 11/30/2022 1740   PHURINE 5.0 11/30/2022 1740  GLUCOSEU NEGATIVE 11/30/2022 1740   HGBUR MODERATE (A) 11/30/2022 1740   BILIRUBINUR NEGATIVE 11/30/2022 1740   BILIRUBINUR Negative 10/28/2022 1120   KETONESUR NEGATIVE 11/30/2022 1740   PROTEINUR 30 (A) 11/30/2022 1740   NITRITE NEGATIVE 11/30/2022 1740   LEUKOCYTESUR NEGATIVE 11/30/2022 1740   Sepsis Labs: @LABRCNTIP (procalcitonin:4,lacticidven:4) ) Recent Results (from the past 240 hour(s))  Culture, blood (Routine X 2) w Reflex to ID Panel     Status: None (Preliminary result)   Collection Time: 11/30/22  1:00 PM   Specimen: Left Antecubital; Blood  Result Value Ref Range Status   Specimen Description   Final    LEFT ANTECUBITAL BOTTLES DRAWN AEROBIC AND ANAEROBIC   Special Requests   Final    Blood Culture results may not be optimal due to an excessive volume of blood received in  culture bottles   Culture   Final    NO GROWTH < 24 HOURS Performed at [facility], [address], [place], [place] [address]    Report Status PENDING  Incomplete  Culture, blood (Routine X 2) w Reflex to ID Panel     Status: None (Preliminary result)   Collection Time: 11/30/22  1:00 PM   Specimen: BLOOD LEFT ARM  Result Value Ref Range Status   Specimen Description BLOOD LEFT ARM AEROBIC BOTTLE ONLY  Final   Special Requests Blood Culture adequate volume  Final   Culture   Final    NO GROWTH < 24 HOURS Performed at [facility] [address], [place], [place] [address]    Report Status PENDING  Incomplete     Scheduled Meds:  alfuzosin  10 mg Oral Q breakfast   Chlorhexidine Gluconate Cloth  6 each Topical Daily   heparin  5,000 Units Subcutaneous Q8H   midodrine  5 mg Oral TID WC   pantoprazole (PROTONIX) IV  40 mg Intravenous Q12H   Continuous Infusions:  piperacillin-tazobactam (ZOSYN)  IV 3.375 g (12/01/22 1205)    Procedures/Studies: US Paracentesis  Result Date: 11/24/2022 INDICATION: Cholangiocarcinoma with recurrent ascites EXAM: ULTRASOUND GUIDED LEFT LOWER QUADRANT THERAPEUTIC PARACENTESIS MEDICATIONS: 4 cc 1% lidocaine COMPLICATIONS: None immediate. PROCEDURE: Informed written consent was obtained from the patient after a discussion of the risks, benefits and alternatives to treatment. A timeout was performed prior to the initiation of the procedure. Initial ultrasound scanning demonstrates a large amount of ascites within the left lower abdominal quadrant. The left lower abdomen was prepped and draped in the usual sterile fashion. 1% lidocaine was used for local anesthesia. Following this, a 19 gauge, 7-cm, Yueh catheter was introduced. An ultrasound image was saved for documentation purposes. The paracentesis was performed. The catheter was removed and a dressing was applied. The patient tolerated the procedure well without immediate post procedural complication.  FINDINGS: A total of approximately 5.5 L of clear yellow fluid was removed. Ordering provider did not request laboratory samples. IMPRESSION: Successful ultrasound-guided therapeutic paracentesis yielding 5.5 liters of peritoneal fluid. Read by: Mina Marble, PA-C Electronically Signed   By: Roanna Banning M.D.   On: 11/24/2022 14:01   US Paracentesis  Result Date: 11/18/2022 INDICATION: Cholangiocarcinoma; recurrent ascites EXAM: ULTRASOUND GUIDED LLQ PARACENTESIS MEDICATIONS: 10 cc 1% lidocaine. COMPLICATIONS: None immediate. PROCEDURE: Informed written consent was obtained from the patient after a discussion of the risks, benefits and alternatives to treatment. A timeout was performed prior to the initiation of the procedure. Initial ultrasound scanning demonstrates a large amount of ascites within the left lower abdominal quadrant. The left  lower abdomen was prepped and draped in the usual sterile fashion. 1% lidocaine was used for local anesthesia. Following this, a 19 gauge, 7-cm, Yueh catheter was introduced. An ultrasound image was saved for documentation purposes. The paracentesis was performed. The catheter was removed and a dressing was applied. The patient tolerated the procedure well without immediate post procedural complication. Patient received post-procedure intravenous albumin; see nursing notes for details. FINDINGS: A total of approximately 5.5 liters of cloudy yellow fluid was removed. IMPRESSION: Successful ultrasound-guided paracentesis yielding 5.5 liters of peritoneal fluid. Read by Robet Leu PAC. Supervised and interpreted by Jeronimo Greaves, M.D. Electronically Signed   By: Jeronimo Greaves M.D.   On: 11/18/2022 14:54   IR Paracentesis  Result Date: 11/14/2022 INDICATION: Patient with history of cholangiocarcinoma, recurrent ascites. Request is made for therapeutic paracentesis of up to 2.5 L maximum. EXAM: ULTRASOUND GUIDED THERAPEUTIC PARACENTESIS MEDICATIONS: 10 mL 1% lidocaine  COMPLICATIONS: None immediate. PROCEDURE: Informed written consent was obtained from the patient after a discussion of the risks, benefits and alternatives to treatment. A timeout was performed prior to the initiation of the procedure. Initial ultrasound scanning demonstrates a moderate amount of ascites within the right lower abdominal quadrant. The right lower abdomen was prepped and draped in the usual sterile fashion. 1% lidocaine was used for local anesthesia. Following this, a 19 gauge, 7-cm, Yueh catheter was introduced. An ultrasound image was saved for documentation purposes. The paracentesis was performed. The catheter was removed and a dressing was applied. The patient tolerated the procedure well without immediate post procedural complication. FINDINGS: A total of approximately 2.5 liters of yellow fluid was removed. IMPRESSION: Successful ultrasound-guided paracentesis yielding 2.5 liters of peritoneal fluid. Read by: Loyce Dys PA-C Electronically Signed   By: Marliss Coots M.D.   On: 11/14/2022 09:47   CT ABDOMEN PELVIS WO CONTRAST  Result Date: 11/12/2022 CLINICAL DATA:  History of unresectable cholangiocarcinoma status post portal vein embolization 06/2021 and stent placement on 05/2021 and 10/2022, most recently status post ERCP 11/09/2022 with elevated liver function tests EXAM: CT ABDOMEN AND PELVIS WITHOUT CONTRAST TECHNIQUE: Multidetector CT imaging of the abdomen and pelvis was performed following the standard protocol without IV contrast. RADIATION DOSE REDUCTION: This exam was performed according to the departmental dose-optimization program which includes automated exposure control, adjustment of the mA and/or kV according to patient size and/or use of iterative reconstruction technique. COMPARISON:  CT abdomen pelvis dated 11/07/2022 FINDINGS: Lower chest: Unchanged subpleural 3 mm right middle lobe nodule (5:3). New small right pleural effusion. Partially imaged heart size is  normal. Coronary artery calcifications. Hepatobiliary: Prior embolization of known intrahepatic cholangiocarcinoma. No new focal hypoattenuating lesions. Similar intrahepatic bile duct dilation with metallic biliary stent in-situ in unchanged position. Interval removal of plastic stent. Normal gallbladder. Pancreas: No focal lesions or main ductal dilation. Spleen: Normal in size without focal abnormality. Adrenals/Urinary Tract: No adrenal nodules. No suspicious renal mass, calculi or hydronephrosis. Mildly distended urinary bladder with catheter in intraluminal gas. Mobile calcified stones within the bladder. Stomach/Bowel: Normal appearance of the stomach. No evidence of bowel wall thickening, distention, or inflammatory changes. Normal appendix. Vascular/Lymphatic: Aortic atherosclerosis. No enlarged abdominal or pelvic lymph nodes. Reproductive: Prostate is unremarkable. Other: Increased moderate volume ascites. No free air. Previously noted omental and mesenteric nodularity is less conspicuous. Musculoskeletal: No acute or abnormal lytic or blastic osseous lesions. Multilevel degenerative changes of the partially imaged thoracic and lumbar spine. Increased diffuse body wall edema. IMPRESSION: 1. Prior embolization of  known intrahepatic cholangiocarcinoma with similar intrahepatic bile duct dilation with metallic biliary stent in-situ in unchanged position. Interval removal of plastic stent. 2. Increased moderate volume ascites and diffuse body wall edema. 3. New small right pleural effusion. 4. Decreased conspicuity of previously noted omental and mesenteric nodularity. Attention on follow-up. 5.  Aortic Atherosclerosis (ICD10-I70.0). Electronically Signed   By: Agustin Cree M.D.   On: 11/12/2022 12:57   ECHOCARDIOGRAM COMPLETE  Result Date: 11/10/2022    ECHOCARDIOGRAM REPORT   Patient Name:   DELANE WESSINGER Date of Exam: 11/10/2022 Medical Rec #:  [id number]       Height:       71.0 in Accession #:     [id number]      Weight:       167.1 lb Date of Birth:  10-30-1955       BSA:          1.954 m Patient Age:    66 years        BP:           101/72 mmHg Patient Gender: M               HR:           74 bpm. Exam Location:  Inpatient Procedure: 2D Echo, Color Doppler, Cardiac Doppler and Intracardiac            Opacification Agent Indications:    Bacteremia  History:        Patient has prior history of Echocardiogram examinations, most                 recent 09/14/2022. CHF, CAD; Risk Factors:Hypertension, Diabetes                 and Dyslipidemia.  Sonographer:    Irving Burton Senior RDCS Referring Phys: [phone] RALPH A NETTEY  Sonographer Comments: Technically difficult due to significantly more lung interference than prior studies. IMPRESSIONS  1. Left ventricular ejection fraction, by estimation, is 40 to 45%. The left ventricle has mildly decreased function. The left ventricle demonstrates regional wall motion abnormalities (see scoring diagram/findings for description). Left ventricular diastolic parameters are consistent with Grade I diastolic dysfunction (impaired relaxation).  2. Right ventricular systolic function is normal. The right ventricular size is normal.  3. The mitral valve is grossly normal. Trivial mitral valve regurgitation.  4. The aortic valve is grossly normal. Aortic valve regurgitation is not visualized. Comparison(s): No significant change from prior study. Conclusion(s)/Recommendation(s): No evidence of valvular vegetations on this transthoracic echocardiogram. Consider a transesophageal echocardiogram to exclude infective endocarditis if clinically indicated. FINDINGS  Left Ventricle: Left ventricular ejection fraction, by estimation, is 40 to 45%. The left ventricle has mildly decreased function. The left ventricle demonstrates regional wall motion abnormalities. Definity contrast agent was given IV to delineate the left ventricular endocardial borders. The left ventricular internal cavity size was  normal in size. There is no left ventricular hypertrophy. Left ventricular diastolic parameters are consistent with Grade I diastolic dysfunction (impaired relaxation).  LV Wall Scoring: The basal inferior segment is aneurysmal. The basal inferolateral segment is dyskinetic. The mid inferolateral segment, mid inferoseptal segment, mid inferior segment, and basal inferoseptal segment are hypokinetic. The entire anterior wall, antero-lateral wall, entire anterior septum, and entire apex are normal. Right Ventricle: The right ventricular size is normal. Right vetricular wall thickness was not well visualized. Right ventricular systolic function is normal. Left Atrium: Left atrial size was normal in size. Right Atrium: Right  atrial size was normal in size. Pericardium: There is no evidence of pericardial effusion. Mitral Valve: The mitral valve is grossly normal. Trivial mitral valve regurgitation. Tricuspid Valve: The tricuspid valve is not well visualized. Tricuspid valve regurgitation is not demonstrated. Aortic Valve: The aortic valve is grossly normal. Aortic valve regurgitation is not visualized. Pulmonic Valve: The pulmonic valve was not well visualized. Pulmonic valve regurgitation is not visualized. Aorta: The aortic root was not well visualized, the ascending aorta was not well visualized and the aortic arch was not well visualized. IAS/Shunts: No atrial level shunt detected by color flow Doppler.   LV Volumes (MOD) LV vol d, MOD A2C: 191.0 ml Diastology LV vol d, MOD A4C: 143.0 ml LV e' medial:    4.35 cm/s LV vol s, MOD A2C: 119.0 ml LV E/e' medial:  14.5 LV vol s, MOD A4C: 70.2 ml  LV e' lateral:   4.24 cm/s LV SV MOD A2C:     72.0 ml  LV E/e' lateral: 14.9 LV SV MOD A4C:     143.0 ml LV SV MOD BP:      73.5 ml RIGHT VENTRICLE RV S prime:     12.90 cm/s TAPSE (M-mode): 2.8 cm LEFT ATRIUM             Index        RIGHT ATRIUM           Index LA Vol (A2C):   47.1 ml 24.11 ml/m  RA Area:     11.20 cm LA Vol  (A4C):   41.9 ml 21.45 ml/m  RA Volume:   21.60 ml  11.06 ml/m LA Biplane Vol: 48.1 ml 24.62 ml/m  AORTIC VALVE LVOT Vmax:   81.60 cm/s LVOT Vmean:  67.000 cm/s LVOT VTI:    0.179 m MITRAL VALVE MV Area (PHT): 4.77 cm    SHUNTS MV Decel Time: 159 msec    Systemic VTI: 0.18 m MV E velocity: 63.00 cm/s MV A velocity: 73.70 cm/s MV E/A ratio:  0.85 Riley Lam MD Electronically signed by Riley Lam MD Signature Date/Time: 11/10/2022/6:02:05 PM    Final    DG ERCP  Result Date: 11/09/2022 CLINICAL DATA:  ERCP EXAM: ERCP TECHNIQUE: Multiple spot images obtained with the fluoroscopic device and submitted for interpretation post-procedure. FLUOROSCOPY: Refer to separate report COMPARISON:  None Available. FINDINGS: A total of 13 fluoroscopic spot images taken during ERCP are submitted for review. Initial images demonstrate a scope overlying the upper abdomen. A plastic biliary stent appears to course through a metallic biliary stent. The plastic biliary stent is removed. Wire catheterization of the common bile duct is performed. Contrast injection opacifies the intrahepatic and extrahepatic biliary tree. The intrahepatic biliary ducts appear normal in caliber. Balloon sweeps are performed through the common bile duct. Final images demonstrate decompression of the intrahepatic biliary tree with passage of contrast material into the small bowel. IMPRESSION: ERCP images as described. Refer to separate procedure report for full details. These images were submitted for radiologic interpretation only. Please see the procedural report for the amount of contrast and the fluoroscopy time utilized. Electronically Signed   By: Olive Bass M.D.   On: 11/09/2022 14:54   US Paracentesis  Result Date: 11/08/2022 INDICATION: Cholangiocarcinoma with small volume ascites request for therapeutic paracentesis. EXAM: ULTRASOUND GUIDED PARACENTESIS MEDICATIONS: 1% lidocaine 10 mL COMPLICATIONS: None immediate.  PROCEDURE: Informed written consent was obtained from the patient after a discussion of the risks, benefits and alternatives to treatment. A  timeout was performed prior to the initiation of the procedure. Initial ultrasound scanning demonstrates a small amount of ascites within the right upper abdomen. The right upper abdomen was prepped and draped in the usual sterile fashion. 1% lidocaine was used for local anesthesia. Following this, a 19 gauge, 7-cm, Yueh catheter was introduced. An ultrasound image was saved for documentation purposes. The paracentesis was performed. The catheter was removed and a dressing was applied. The patient tolerated the procedure well without immediate post procedural complication. Scratch FINDINGS: A total of approximately 1.2 L of clear yellow fluid was removed. Samples were sent to the laboratory as requested by the clinical team. IMPRESSION: Successful ultrasound-guided paracentesis yielding 1.2 liters of peritoneal fluid. Procedure performed by: Corrin Parker, PA-C Electronically Signed   By: Irish Lack M.D.   On: 11/08/2022 14:08   CT ABDOMEN PELVIS WO CONTRAST  Result Date: 11/07/2022 CLINICAL DATA:  Abdominal pain, acute, nonlocalized. Fever with weakness and dizziness for 3 days. Jaundice. History of intrahepatic cholangiocarcinoma. * Tracking Code: BO * EXAM: CT ABDOMEN AND PELVIS WITHOUT CONTRAST TECHNIQUE: Multidetector CT imaging of the abdomen and pelvis was performed following the standard protocol without IV contrast. RADIATION DOSE REDUCTION: This exam was performed according to the departmental dose-optimization program which includes automated exposure control, adjustment of the mA and/or kV according to patient size and/or use of iterative reconstruction technique. COMPARISON:  Abdominopelvic CT 08/28/2022. Abdominal MRI 10/19/2022. FINDINGS: Lower chest: 4 mm right middle lobe nodule on image 4/4 is unchanged from the prior chest CT. There is central airway  thickening at both lung bases with mild dependent right lower lobe atelectasis. No consolidation or enlarging nodule identified. Aortic and coronary artery atherosclerosis noted. 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 Hepatobiliary: Previous embolization procedure for intrahepatic cholangiocarcinoma noted centrally in the liver, with associated beam hardening artifact. No recurrent focal mass lesion identified. Previously demonstrated known large gallstone is not well visualized. The gallbladder appears unchanged with mild wall thickening. A metallic biliary stent is in place with interval placement of a plastic biliary stent extending superiorly into the hepatic hilum and inferiorly into the ampulla. There is no pneumobilia, and the intrahepatic biliary dilatation appears slightly increased. Pancreas: Unremarkable. No pancreatic ductal dilatation or surrounding inflammatory changes. Spleen: Normal in size without focal abnormality. Adrenals/Urinary Tract: Both adrenal glands appear normal. No evidence of renal or ureteral calculus, hydronephrosis or perinephric soft tissue stranding. Both kidneys appear unremarkable. A calcified bladder calculus is again noted. Foley catheter extends into the bladder lumen. The bladder is decompressed. Stomach/Bowel: No enteric contrast administered. The stomach appears unremarkable for its degree of distention. There is possible mild small bowel wall thickening in the mid abdomen, but no significant bowel distension. Mild diverticulosis of the distal colon. The colon and appendix otherwise appear unremarkable. Vascular/Lymphatic: There are no enlarged abdominal or pelvic lymph nodes. Diffuse aortic and branch vessel atherosclerosis with chronically displaced intimal calcifications within the infrarenal abdominal aorta. No focal aneurysm. Reproductive: The prostate gland and seminal vesicles appear unremarkable. Other: Interval  increased volume of ascites with mild nodularity throughout the omental and mesenteric fat, suspicious for carcinomatosis. No free intraperitoneal air. Musculoskeletal: No acute or significant osseous findings. Interval increased diffuse subcutaneous edema, asymmetric to the right. Stable mild lumbar spondylosis. IMPRESSION: 1. Interval increased volume of ascites with mild nodularity throughout the omental and mesenteric fat, suspicious for carcinomatosis. 2. Interval placement of a plastic biliary stent through the metallic wall stent  which appears well positioned. However, there is increased intrahepatic biliary dilatation and no pneumobilia, suspicious for stent dysfunction. Correlate with liver function studies. 3. Previous embolization procedure for intrahepatic cholangiocarcinoma. No recurrent focal mass lesion identified. 4. No evidence of bowel obstruction or perforation. Possible mild small bowel wall thickening in the mid abdomen. 5. Stable 4 mm right middle lobe pulmonary nodule. 6. Stable calcified bladder calculus. 7. Interval increased diffuse subcutaneous edema, asymmetric to the right. 8.  Aortic Atherosclerosis (ICD10-I70.0). Electronically Signed   By: Carey Bullocks M.D.   On: 11/07/2022 14:05   DG Chest Port 1 View  Result Date: 11/07/2022 CLINICAL DATA:  Questionable sepsis - evaluate for abnormality EXAM: PORTABLE CHEST - 1 VIEW COMPARISON:  04/09/2022 FINDINGS: Right arm PICC line has been placed to the distal SVC, the right IJ port removed. Lungs are clear. Heart size and mediastinal contours are within normal limits. No effusion. Visualized bones unremarkable. IMPRESSION: No acute cardiopulmonary disease. Electronically Signed   By: Corlis Leak M.D.   On: 11/07/2022 12:03   US Paracentesis  Result Date: 11/02/2022 INDICATION: Malignant ascites. EXAM: ULTRASOUND GUIDED  PARACENTESIS MEDICATIONS: None. COMPLICATIONS: None immediate. PROCEDURE: Informed written consent was obtained  from the patient after a discussion of the risks, benefits and alternatives to treatment. A timeout was performed prior to the initiation of the procedure. Initial ultrasound scanning demonstrates a large amount of ascites within the right lower abdominal quadrant. The right lower abdomen was prepped and draped in the usual sterile fashion. 1% lidocaine was used for local anesthesia. Following this, a 8 Fr Safe-T-Centesis catheter was introduced. An ultrasound image was saved for documentation purposes. The paracentesis was performed. The catheter was removed and a dressing was applied. The patient tolerated the procedure well without immediate post procedural complication. FINDINGS: A total of approximately 3.3 L of yellowish fluid was removed. IMPRESSION: Successful ultrasound-guided paracentesis yielding 3.3 liters of peritoneal fluid. Electronically Signed   By: Bary Richard M.D.   On: 11/02/2022 12:22    Catarina Hartshorn, DO  [facility]  If 7PM-7AM, please contact night-coverage [email or web address] Password [email or web address] 12/01/2022, 1:56 PM   LOS: 1 day

## 2022-12-02 ENCOUNTER — Inpatient Hospital Stay (HOSPITAL_COMMUNITY): Payer: Medicare Other

## 2022-12-02 ENCOUNTER — Ambulatory Visit: Payer: Medicare Other | Admitting: Urology

## 2022-12-02 ENCOUNTER — Encounter (HOSPITAL_COMMUNITY): Payer: Self-pay | Admitting: Internal Medicine

## 2022-12-02 DIAGNOSIS — N179 Acute kidney failure, unspecified: Secondary | ICD-10-CM | POA: Diagnosis not present

## 2022-12-02 DIAGNOSIS — C221 Intrahepatic bile duct carcinoma: Secondary | ICD-10-CM | POA: Diagnosis not present

## 2022-12-02 DIAGNOSIS — F17200 Nicotine dependence, unspecified, uncomplicated: Secondary | ICD-10-CM | POA: Diagnosis not present

## 2022-12-02 DIAGNOSIS — Z515 Encounter for palliative care: Secondary | ICD-10-CM | POA: Diagnosis not present

## 2022-12-02 DIAGNOSIS — Z7189 Other specified counseling: Secondary | ICD-10-CM | POA: Diagnosis not present

## 2022-12-02 DIAGNOSIS — I5022 Chronic systolic (congestive) heart failure: Secondary | ICD-10-CM | POA: Diagnosis not present

## 2022-12-02 LAB — CBC
HCT: 22.8 % — ABNORMAL LOW (ref 39.0–52.0)
Hemoglobin: 7.6 g/dL — ABNORMAL LOW (ref 13.0–17.0)
MCH: 33.5 pg (ref 26.0–34.0)
MCHC: 33.3 g/dL (ref 30.0–36.0)
MCV: 100.4 fL — ABNORMAL HIGH (ref 80.0–100.0)
Platelets: 88 10*3/uL — ABNORMAL LOW (ref 150–400)
RBC: 2.27 MIL/uL — ABNORMAL LOW (ref 4.22–5.81)
RDW: 17.5 % — ABNORMAL HIGH (ref 11.5–15.5)
WBC: 6.2 10*3/uL (ref 4.0–10.5)
nRBC: 0 % (ref 0.0–0.2)

## 2022-12-02 LAB — COMPREHENSIVE METABOLIC PANEL
ALT: 66 U/L — ABNORMAL HIGH (ref 0–44)
AST: 101 U/L — ABNORMAL HIGH (ref 15–41)
Albumin: 2.5 g/dL — ABNORMAL LOW (ref 3.5–5.0)
Alkaline Phosphatase: 429 U/L — ABNORMAL HIGH (ref 38–126)
Anion gap: 7 (ref 5–15)
BUN: 60 mg/dL — ABNORMAL HIGH (ref 8–23)
CO2: 17 mmol/L — ABNORMAL LOW (ref 22–32)
Calcium: 8.2 mg/dL — ABNORMAL LOW (ref 8.9–10.3)
Chloride: 105 mmol/L (ref 98–111)
Creatinine, Ser: 2.92 mg/dL — ABNORMAL HIGH (ref 0.61–1.24)
GFR, Estimated: 23 mL/min — ABNORMAL LOW (ref >60)
Glucose, Bld: 128 mg/dL — ABNORMAL HIGH (ref 70–99)
Potassium: 4.6 mmol/L (ref 3.5–5.1)
Sodium: 129 mmol/L — ABNORMAL LOW (ref 135–145)
Total Bilirubin: 3.1 mg/dL — ABNORMAL HIGH (ref 0.3–1.2)
Total Protein: 5.3 g/dL — ABNORMAL LOW (ref 6.5–8.1)

## 2022-12-02 LAB — CULTURE, BLOOD (ROUTINE X 2)
Culture: NO GROWTH
Special Requests: ADEQUATE

## 2022-12-02 MED ORDER — PIPERACILLIN-TAZOBACTAM 3.375 G IVPB: 3.3750 g | Freq: Three times a day (TID) | INTRAVENOUS | Status: DC

## 2022-12-02 MED ORDER — ALBUMIN HUMAN 25 % IV SOLN
25.0000 g | Freq: Four times a day (QID) | INTRAVENOUS | Status: DC
  Administered 2022-12-02 (×2): 25 g via INTRAVENOUS
  Filled 2022-12-02 (×2): qty 100

## 2022-12-02 MED ORDER — PIPERACILLIN-TAZOBACTAM 3.375 G IVPB 30 MIN
3.3750 g | Freq: Three times a day (TID) | INTRAVENOUS | Status: DC
  Filled 2022-12-02 (×2): qty 50

## 2022-12-02 NOTE — Plan of Care (Signed)
  Problem: Acute Rehab PT Goals(only PT should resolve) Goal: Pt Will Ambulate Flowsheets (Taken 12/02/2022 1106) Pt will Ambulate:  > 125 feet  with modified independence  with least restrictive assistive device Goal: Pt/caregiver will Perform Home Exercise Program Flowsheets (Taken 12/02/2022 1106) Pt/caregiver will Perform Home Exercise Program:  For increased strengthening  For improved balance  With Supervision, verbal cues required/provided   11:06 AM, 12/02/22 Georges Lynch PT DPT  Physical Therapist with [facility]  [phone]

## 2022-12-02 NOTE — Progress Notes (Signed)
Palliative: [name] is resting quietly in bed.  He appears acutely/chronically ill and very frail.  He is resting comfortably, but wakes easily.  He does close his eyes during most of the discussion.  His wife of 48 years, Jonathan Keith, is present along with daughter Jonathan Keith and son Jonathan Keith.  Later in our conversation Jonathan Keith's son arrives.  We talk about Jonathan Keith acute health concerns including, but not limited to, low albumin, currently receiving albumin transfusions, electrolyte derangement such as high potassium and low sodium, kidney dysfunction.  Many times during our conversation Jonathan Keith will open his eyes and state, "I want to go home".  I share that if he goes home he would be best served with hospice care.  We talk in detail about what is and is not provided under hospice care including, but not limited to, no IV fluids, no IV antibiotics, stopping medications that are not focused on comfort and dignity.  I share that he would likely be able to receive subcu octreotide and certainly medicines for comfort, anxiety, breathlessness.  Hospice provider choice offered.  Family states that they would want hospice of [facility].  At this point holding referral.  We talk about fluid overload.  At this point patient and family are considering Pleurx drain placement.  Face-to-face conference with attending.   We talk in detail about rehospitalization.  We talk about rehospitalization in light of the fact that he prefers to die at home.   Wife asks several times what she would do "if" he becomes sick again.  I share that he will become sick again, and that hospice service will be able to make sure that he is comfortable.   We talk about preferred place of death.  All family are in agreement that Jonathan Keith has stated that he would prefer to die in his own home.  He also states this today.   Prognosis discussed with permission.  I share that days to weeks (5 to 20) would not be surprising.  We talk  about what is normal and expected as people are nearing end-of-life.  Sleeping more and eating and interacting less.  Family states that they have been seeing this.  We again talk about the detriment of IV fluids at end-of-life.   Multiple face-to-face conference with attending, bedside nursing staff, transition of care team related to patient condition, needs, goals of care, disposition.  I returned later in the afternoon to find chaplain present.  Jonathan Keith is alert, making and mostly keeping eye contact.  Family shares that at this point they want to return home today since Jonathan Keith birthday is tomorrow.  They are requesting home with home health.  Continue all medications and all treatments.  They would like outpatient palliative services.  They are requesting paracentesis today if possible, if not then they will set up paracentesis at a later time.  We talk about do not rehospitalize.  Face-to-face conference with attending and transition of care team.  Plan: Now DNR (goldenrod form/DNR completed and placed on chart).  Home with [facility] already in place.  Outpatient palliative services through hosp[facility].   Prognosis: 2 - 4 weeks would be anticipated.  75 minutes, extended time Lillia Carmel, NP Palliative medicine team Team phone [phone] Greater than 50% of this time was spent counseling and coordinating care related to the above assessment and plan.

## 2022-12-02 NOTE — Progress Notes (Addendum)
Patient has not urinated since foley removal around 1100. Patient has not had much to drink other than sips with meds. Patient does currently have NS going at 75 ml/hr. MD Tat and MD Mckenzie notified.  MD Mckenzie stated to place foley catheter back in. This nurse went in to place foley back and patient had urinated 225 ml. MD Ronne Binning and MD Tat made aware.

## 2022-12-02 NOTE — Evaluation (Signed)
Physical Therapy Evaluation Patient Details Name: Jonathan Keith MRN: [id number] DOB: [birth year] Today's Date: 12/02/2022  History of Present Illness  Jonathan Keith is a 67 year old male with a history of metastatic cholangiocarcinoma, coronary artery disease with STEMI February 2023, HFrEF, COPD, tobacco abuse, diabetes mellitus type 2, hyperlipidemia, and CKD stage IIIb presenting from [facility] secondary to abnormal labs.  The patient had his routine appointment for chemotherapy on 11/30/2022.  Labs were done routinely and noted sodium 125, potassium 6.8, serum creatinine 3.00.  AST 125, ALT 76, total bilirubin 3.6.  WBC 12.4, hemoglobin 9.9, platelets 156.  The patient was sent to the emergency department for further evaluation and treatment for his lab abnormalities.   Clinical Impression  Patient presents supine in bed, with wife and daughter present in room. Patient is lethargic, but awake and cooperative. Patient moves slowly, but able to perform bed mobility and transfers Mod I. Patient requires RW for gait but is able to ambulate 100 feet with CGA in hallway. Patient limited by fatigue and returned to room. Returned to bed, with phone and call bell in reach, bed alarm set, wife and daughter present in room. Patient will benefit from continued physical therapy in hospital and recommended venue below to increase strength, balance, endurance for safe ADLs and gait.       Recommendations for follow up therapy are one component of a multi-disciplinary discharge planning process, led by the attending physician.  Recommendations may be updated based on patient status, additional functional criteria and insurance authorization.  Follow Up Recommendations       Assistance Recommended at Discharge Intermittent Supervision/Assistance  Patient can return home with the following  A little help with bathing/dressing/bathroom;Assistance with cooking/housework;Assist for transportation;Help  with stairs or ramp for entrance;A little help with walking and/or transfers    Equipment Recommendations None recommended by PT  Recommendations for Other Services       Functional Status Assessment Patient has had a recent decline in their functional status and demonstrates the ability to make significant improvements in function in a reasonable and predictable amount of time.     Precautions / Restrictions Precautions Precautions: Fall Restrictions Weight Bearing Restrictions: No      Mobility  Bed Mobility Overal bed mobility: Modified Independent             General bed mobility comments: slow, labored movement    Transfers Overall transfer level: Modified independent Equipment used: Rolling walker (2 wheels) Transfers: Sit to/from Stand Sit to Stand: Supervision           General transfer comment: slow, labored transfers    Ambulation/Gait Ambulation/Gait assistance: Min guard Gait Distance (Feet): 100 Feet Assistive device: Rolling walker (2 wheels) Gait Pattern/deviations: Decreased step length - right, Decreased step length - left, Trunk flexed Gait velocity: decreased     General Gait Details: slow, fatigues quickly  Stairs            Wheelchair Mobility    Modified Rankin (Stroke Patients Only)       Balance Overall balance assessment: Needs assistance Sitting-balance support: No upper extremity supported, Feet supported Sitting balance-Leahy Scale: Good Sitting balance - Comments: sitting at EOB   Standing balance support: Bilateral upper extremity supported, Reliant on assistive device for balance Standing balance-Leahy Scale: Fair Standing balance comment: using RW at bedside  Pertinent Vitals/Pain Pain Assessment Pain Assessment: No/denies pain    Home Living Family/patient expects to be discharged to:: Private residence Living Arrangements: Spouse/significant other Available Help  at Discharge: Family;Available 24 hours/day Type of Home: House Home Access: Ramped entrance       Home Layout: Two level;Able to live on main level with bedroom/bathroom Home Equipment: Rolling Walker (2 wheels);Cane - single point;BSC/3in1;Wheelchair - manual;Shower seat Additional Comments: has hospital bed but says they need a mattress for it    Prior Function Prior Level of Function : Independent/Modified Independent               ADLs Comments: Wife assist PRN     Hand Dominance        Extremity/Trunk Assessment   Upper Extremity Assessment Upper Extremity Assessment: Generalized weakness    Lower Extremity Assessment Lower Extremity Assessment: Generalized weakness    Cervical / Trunk Assessment Cervical / Trunk Assessment: Normal  Communication   Communication: No difficulties  Cognition Arousal/Alertness: Awake/alert Behavior During Therapy: WFL for tasks assessed/performed Overall Cognitive Status: Within Functional Limits for tasks assessed                                          General Comments      Exercises     Assessment/Plan    PT Assessment Patient needs continued PT services  PT Problem List Decreased strength;Decreased activity tolerance;Decreased balance;Decreased mobility;Decreased knowledge of use of DME       PT Treatment Interventions DME instruction;Gait training;Functional mobility training;Therapeutic activities;Therapeutic exercise;Balance training;Neuromuscular re-education;Patient/family education    PT Goals (Current goals can be found in the Care Plan section)  Acute Rehab PT Goals Patient Stated Goal: Return home PT Goal Formulation: With patient/family Time For Goal Achievement: 12/16/22 Potential to Achieve Goals: Good    Frequency Min 2X/week     Co-evaluation               AM-PAC PT "6 Clicks" Mobility  Outcome Measure Help needed turning from your back to your side while in a flat  bed without using bedrails?: A Little Help needed moving from lying on your back to sitting on the side of a flat bed without using bedrails?: A Little Help needed moving to and from a bed to a chair (including a wheelchair)?: A Little Help needed standing up from a chair using your arms (e.g., wheelchair or bedside chair)?: A Little Help needed to walk in hospital room?: A Little Help needed climbing 3-5 steps with a railing? : A Lot 6 Click Score: 17    End of Session Equipment Utilized During Treatment: Gait belt Activity Tolerance: Patient limited by fatigue Patient left: with call bell/phone within reach;with bed alarm set;with family/visitor present;in bed Nurse Communication: Mobility status PT Visit Diagnosis: Other abnormalities of gait and mobility (R26.89);Muscle weakness (generalized) (M62.81);Difficulty in walking, not elsewhere classified (R26.2)    Time: 1610-9604 PT Time Calculation (min) (ACUTE ONLY): 24 min   Charges:   PT Evaluation $PT Eval Low Complexity: 1 Low PT Treatments $Gait Training: 8-22 mins    11:05 AM, 12/02/22 Georges Lynch PT DPT  Physical Therapist with [facility]  [phone]

## 2022-12-02 NOTE — Discharge Summary (Signed)
Physician Discharge Summary   Patient: Jonathan Keith MRN: [id number] DOB: July 21, 1956  Admit date:     11/30/2022  Discharge date: 12/02/22  Discharge Physician: Onalee Hua Lizzie Cokley   PCP: Elfredia Nevins, MD   Recommendations at discharge:   Please follow up with primary care provider within 1-2 weeks  Please repeat BMP and CBC in one week  Please follow up on/with    Discharge Diagnoses: Principal Problem:   Acute renal failure superimposed on stage 3b chronic kidney disease, unspecified acute renal failure type Active Problems:   Chronic HFrEF (heart failure with reduced ejection fraction)   Tobacco dependence   Cholangiocarcinoma   Transaminitis   Hypotension  Resolved Problems:   * No resolved hospital problems. [facility] Course: 67 year old male with a history of metastatic cholangiocarcinoma, coronary artery disease with STEMI February 2023, HFrEF, COPD, tobacco abuse, diabetes mellitus type 2, hyperlipidemia, and CKD stage IIIb presenting from [facility] secondary to abnormal labs.  The patient had his routine appointment for chemotherapy on 11/30/2022.  Labs were done routinely and noted sodium 125, potassium 6.8, serum creatinine 3.00.  AST 125, ALT 76, total bilirubin 3.6.  WBC 12.4, hemoglobin 9.9, platelets 156.  The patient was sent to the emergency department for further evaluation and treatment for his lab abnormalities.  Notably, the patient was recently hospitalized from 11/07/2022 to 11/16/2022 for sepsis secondary to Enterococcus faecalis bacteremia.  He was treated with Unasyn and subsequently transition to oral amoxicillin which he finished on 11/22/2022.  During that hospitalization, the patient had ERCP on 11/09/2022 with removal of his plastic stent. ERCP 4/1 showed partial occlusion of plastic biliary stent that went from CBD to left main hepatic duct, removed.  Previously placed uncovered metal stent was patent.  Biliary tree swept of sludge. -Repeat CT 4/4 shows  similar intrahepatic bile duct dilation with metal biliary stent in place, moderate volume ascites and diffuse body wall edema  Since discharge from the hospital, the patient underwent cystoscopy with cystolitholopaxy performed by Dr. Ronne Binning on 11/19/2022.  Foley catheter was placed after the procedure.  The patient's son at the bedside supplements the history.  The patient has had increasing generalized weakness for the past week.  He has had some dizziness with low BPs for the past week.  He is having some intermittent nausea and vomiting primarily with food.  There is no hematemesis, diarrhea, hematochezia, melena.  He has not any fevers or chills.  There is no chest pain, shortness breath, cough, hemoptysis.  He denies any headache or neck pain, abdominal pain,  In the ED, the patient was afebrile with soft blood pressure.  Oxygen saturation was 100% room air.  Labs are discussed above. Lactic acid 1.8.  AST 125, ALT at 76, alkaline phosphatase 518, total bilirubin 3.6. I have placed orders for blood cultures and urine cultures to be collected in the emergency department.  The patient was start on empiric zosyn and IVF were started.  The patient was given multiple doses of intravenous albumin.  His renal function stabilized.  Palliative medicine was consulted.  Multiple goals of care discussions were held.  The patient's sodium gradually improved and stabilized.  His blood cultures remain negative.  The patient remained afebrile and hemodynamically stable albeit with soft blood pressures. After numerous goals of care discussions, the patient and her family initially were interested in placing a Pleurx catheter for ascites drainage as a palliative measure for hospice care.  However, after period of time  the patient and family changed her mind and deferred to Pleurx catheter placement.  Ultimately, the patient and family wanted to go home with home health and continued full scope of care.  Assessment  and Plan: Acute on chronic renal failure--CKD stage IIIb -Primarily due to volume depletion and hemodynamic changes -Patient is chronically hypotensive -Given albumin x 4 -continue IV fluids judiciously -repeated albumin 4/23 -Serum creatinine stabilized. -Renal ultrasound negative for hydronephrosis -Suspect the patient likely has new baseline creatinine 2.5-3.0   Hyperkalemia -Lokelma and other temporizing measures given in the ED -4/23--redose Lokelma -Resolved   Hypotension -Continue midodrine -Lactic acid 1.8 -Check procalcitonin 2.88 -Blood cultures x 2 sets--neg to date -UA --no pyuria -continue empiric zosyn for now -albumin as discussed above -continue judicious IVF -Overall blood pressure improved albeit remained soft.  SBP in the upper 90s and low 100s   Hyponatremia -serum osm 287 -urine osm 475 -urine Na <10 -volume depletion, poor solute intake and a degree of SIADH -Sodium 129 on the day of discharge   Chronic indwelling Foley catheter -Placed on 11/19/2022 at the time of his cystoscopy and cystolitholapaxy -After discussion with urology, Dr. Ronne Binning, Foley catheter was removed   Metastatic cholangiocarcinoma -Status post portal vein embolization 06/2021 -Patient had a plastic stent removed 11/09/2022 -He still has indwelling bare-metal stent which was patent on ERCP 11/09/2022 -Continue to follow Dr. Ellin Saba -Last paracentesis 11/24/2022 removing 5.5 L -LFTs and bili trending down--overall remained stable throughout the hospitalization -Paracentesis was performed on 12/02/2022 prior to discharge   Hyponatremia -Secondary to volume depletion and poor solute intake -Start IV normal saline -Improved as discussed above   Chronic HFrEF -Clinically euvolemic -Holding diuretics -Holding carvedilol secondary to soft blood pressures -11/10/2022 echo EF 40 to 45%, grade 1 DD,+WMA   COPD -Stable on room air   Tobacco abuse, in remission -Patient has not  smoked since discharge from the hospital on 11/16/2022   Coronary artery disease with history of MI -No chest pain presently -Holding carvedilol secondary to soft blood pressures   Impaired glucose tolerance -Allowing for liberal glycemic control at this point   Goals of Care -discussed with son, spouse and patient  -Numerous goals of care discussions as discussed above -Patient ultimately agreed to DNR -continue full scope of care         Consultants: palliative Procedures performed: paracentesis  Disposition: Home Diet recommendation:  Cardiac diet DISCHARGE MEDICATION: Allergies as of 12/02/2022   No Known Allergies      Medication List     STOP taking these medications    atorvastatin 40 MG tablet Commonly known as: LIPITOR   lidocaine 5 % Commonly known as: LIDODERM   metFORMIN 500 MG tablet Commonly known as: GLUCOPHAGE   nitroGLYCERIN 0.4 MG SL tablet Commonly known as: Nitrostat   oxyCODONE-acetaminophen 5-325 MG tablet Commonly known as: Percocet       TAKE these medications    acetaminophen 500 MG tablet Commonly known as: TYLENOL Take 500 mg by mouth every 6 (six) hours as needed for moderate pain or headache.   albuterol 108 (90 Base) MCG/ACT inhaler Commonly known as: VENTOLIN HFA Inhale 2 puffs into the lungs every 6 (six) hours as needed for wheezing or shortness of breath.   alfuzosin 10 MG 24 hr tablet Commonly known as: UROXATRAL Take 1 tablet (10 mg total) by mouth at bedtime.   carvedilol 3.125 MG tablet Commonly known as: COREG Take 3.125 mg by mouth 2 (two) times daily with  a meal.   CISplatin 50 MG/50ML Soln Commonly known as: PLATINOL Inject 50 mg into the vein once a week. On days 1 and 8, every 21 days   durvalumab 1,500 mg in sodium chloride 0.9 % 100 mL Inject 1,500 mg into the vein every 21 ( twenty-one) days.   famotidine 20 MG tablet Commonly known as: PEPCID Take 20 mg by mouth 2 (two) times daily.    ferrous sulfate 325 (65 FE) MG tablet Commonly known as: FerrouSul Take 1 tablet (325 mg total) by mouth daily with breakfast.   gemcitabine 760 mg/m2 in sodium chloride 0.9 % 100 mL Inject 760 mg/m2 into the vein once a week. On days 1 and 8 every 21 days   guaifenesin 100 MG/5ML syrup Commonly known as: ROBITUSSIN Take 200 mg by mouth 3 (three) times daily as needed for cough.   Intrasite Gel Applipak Gel Apply 1 Units topically daily.   midodrine 5 MG tablet Commonly known as: PROAMATINE Take 1 tablet (5 mg total) by mouth 3 (three) times daily with meals.   Misc. Devices Kit 1 Box by Does not apply route every 7 (seven) days.   multivitamin with minerals Tabs tablet Take 1 tablet by mouth daily.   ondansetron 4 MG tablet Commonly known as: ZOFRAN Take 1 tablet (4 mg total) by mouth every 6 (six) hours as needed for nausea.   pantoprazole 40 MG tablet Commonly known as: PROTONIX Take 1 tablet (40 mg total) by mouth daily.   prochlorperazine 10 MG tablet Commonly known as: COMPAZINE Take 1 tablet (10 mg total) by mouth every 6 (six) hours as needed for nausea or vomiting.   sodium chloride flush 0.9 % Soln Commonly known as: NS 10 mLs by Intracatheter route every 7 (seven) days. What changed: when to take this        Follow-up Information     [facility] Follow up.   Why: Will contact you to schedule home health visits.        [facility] Follow up.   Why: For outpatient palliative follow up. Contact information: [address] [place] [address] [phone]                Discharge Exam: Filed Weights   11/30/22 0903 11/30/22 0933 11/30/22 1444  Weight: 69.9 kg 69.8 kg 73.6 kg   HEENT:  [place]/AT, No thrush, no icterus CV:  RRR, no rub, no S3, no S4 Lung:  fine bibasilar crackles. No wheeze Abd:  soft/+BS, NT Ext:  No edema, no lymphangitis, no synovitis, no rash   Condition at discharge: stable  The results  of significant diagnostics from this hospitalization (including imaging, microbiology, ancillary and laboratory) are listed below for reference.   Imaging Studies: US RENAL  Result Date: 12/01/2022 CLINICAL DATA:  Acute on chronic renal failure. EXAM: RENAL / URINARY TRACT ULTRASOUND COMPLETE COMPARISON:  CT scan of November 12, 2022. FINDINGS: Right Kidney: Renal measurements: 10.5 x 5.6 x 4.5 cm = volume: 138 mL. Increased echogenicity of renal parenchyma is noted suggesting medical renal disease. No mass or hydronephrosis visualized. Left Kidney: Renal measurements: 9.5 x 5.7 x 4.7 cm = volume: 132 mL. Increased echogenicity of renal parenchyma is noted suggesting medical renal disease. No mass or hydronephrosis visualized. Bladder: Decompressed secondary to Foley catheter. Other: Moderate ascites is noted. IMPRESSION: Increased echogenicity of renal parenchyma is noted bilaterally suggesting medical renal disease. No hydronephrosis or renal obstruction is noted. Moderate ascites. Electronically  Signed   By: Lupita Raider M.D.   On: 12/01/2022 17:49   US Paracentesis  Result Date: 11/24/2022 INDICATION: Cholangiocarcinoma with recurrent ascites EXAM: ULTRASOUND GUIDED LEFT LOWER QUADRANT THERAPEUTIC PARACENTESIS MEDICATIONS: 4 cc 1% lidocaine COMPLICATIONS: None immediate. PROCEDURE: Informed written consent was obtained from the patient after a discussion of the risks, benefits and alternatives to treatment. A timeout was performed prior to the initiation of the procedure. Initial ultrasound scanning demonstrates a large amount of ascites within the left lower abdominal quadrant. The left lower abdomen was prepped and draped in the usual sterile fashion. 1% lidocaine was used for local anesthesia. Following this, a 19 gauge, 7-cm, Yueh catheter was introduced. An ultrasound image was saved for documentation purposes. The paracentesis was performed. The catheter was removed and a dressing was applied. The  patient tolerated the procedure well without immediate post procedural complication. FINDINGS: A total of approximately 5.5 L of clear yellow fluid was removed. Ordering provider did not request laboratory samples. IMPRESSION: Successful ultrasound-guided therapeutic paracentesis yielding 5.5 liters of peritoneal fluid. Read by: Mina Marble, PA-C Electronically Signed   By: Roanna Banning M.D.   On: 11/24/2022 14:01   US Paracentesis  Result Date: 11/18/2022 INDICATION: Cholangiocarcinoma; recurrent ascites EXAM: ULTRASOUND GUIDED LLQ PARACENTESIS MEDICATIONS: 10 cc 1% lidocaine. COMPLICATIONS: None immediate. PROCEDURE: Informed written consent was obtained from the patient after a discussion of the risks, benefits and alternatives to treatment. A timeout was performed prior to the initiation of the procedure. Initial ultrasound scanning demonstrates a large amount of ascites within the left lower abdominal quadrant. The left lower abdomen was prepped and draped in the usual sterile fashion. 1% lidocaine was used for local anesthesia. Following this, a 19 gauge, 7-cm, Yueh catheter was introduced. An ultrasound image was saved for documentation purposes. The paracentesis was performed. The catheter was removed and a dressing was applied. The patient tolerated the procedure well without immediate post procedural complication. Patient received post-procedure intravenous albumin; see nursing notes for details. FINDINGS: A total of approximately 5.5 liters of cloudy yellow fluid was removed. IMPRESSION: Successful ultrasound-guided paracentesis yielding 5.5 liters of peritoneal fluid. Read by Robet Leu PAC. Supervised and interpreted by Jeronimo Greaves, M.D. Electronically Signed   By: Jeronimo Greaves M.D.   On: 11/18/2022 14:54   IR Paracentesis  Result Date: 11/14/2022 INDICATION: Patient with history of cholangiocarcinoma, recurrent ascites. Request is made for therapeutic paracentesis of up to 2.5 L maximum.  EXAM: ULTRASOUND GUIDED THERAPEUTIC PARACENTESIS MEDICATIONS: 10 mL 1% lidocaine COMPLICATIONS: None immediate. PROCEDURE: Informed written consent was obtained from the patient after a discussion of the risks, benefits and alternatives to treatment. A timeout was performed prior to the initiation of the procedure. Initial ultrasound scanning demonstrates a moderate amount of ascites within the right lower abdominal quadrant. The right lower abdomen was prepped and draped in the usual sterile fashion. 1% lidocaine was used for local anesthesia. Following this, a 19 gauge, 7-cm, Yueh catheter was introduced. An ultrasound image was saved for documentation purposes. The paracentesis was performed. The catheter was removed and a dressing was applied. The patient tolerated the procedure well without immediate post procedural complication. FINDINGS: A total of approximately 2.5 liters of yellow fluid was removed. IMPRESSION: Successful ultrasound-guided paracentesis yielding 2.5 liters of peritoneal fluid. Read by: Loyce Dys PA-C Electronically Signed   By: Marliss Coots M.D.   On: 11/14/2022 09:47   CT ABDOMEN PELVIS WO CONTRAST  Result Date: 11/12/2022 CLINICAL DATA:  History of unresectable cholangiocarcinoma status post portal vein embolization 06/2021 and stent placement on 05/2021 and 10/2022, most recently status post ERCP 11/09/2022 with elevated liver function tests EXAM: CT ABDOMEN AND PELVIS WITHOUT CONTRAST TECHNIQUE: Multidetector CT imaging of the abdomen and pelvis was performed following the standard protocol without IV contrast. RADIATION DOSE REDUCTION: This exam was performed according to the departmental dose-optimization program which includes automated exposure control, adjustment of the mA and/or kV according to patient size and/or use of iterative reconstruction technique. COMPARISON:  CT abdomen pelvis dated 11/07/2022 FINDINGS: Lower chest: Unchanged subpleural 3 mm right middle lobe  nodule (5:3). New small right pleural effusion. Partially imaged heart size is normal. Coronary artery calcifications. Hepatobiliary: Prior embolization of known intrahepatic cholangiocarcinoma. No new focal hypoattenuating lesions. Similar intrahepatic bile duct dilation with metallic biliary stent in-situ in unchanged position. Interval removal of plastic stent. Normal gallbladder. Pancreas: No focal lesions or main ductal dilation. Spleen: Normal in size without focal abnormality. Adrenals/Urinary Tract: No adrenal nodules. No suspicious renal mass, calculi or hydronephrosis. Mildly distended urinary bladder with catheter in intraluminal gas. Mobile calcified stones within the bladder. Stomach/Bowel: Normal appearance of the stomach. No evidence of bowel wall thickening, distention, or inflammatory changes. Normal appendix. Vascular/Lymphatic: Aortic atherosclerosis. No enlarged abdominal or pelvic lymph nodes. Reproductive: Prostate is unremarkable. Other: Increased moderate volume ascites. No free air. Previously noted omental and mesenteric nodularity is less conspicuous. Musculoskeletal: No acute or abnormal lytic or blastic osseous lesions. Multilevel degenerative changes of the partially imaged thoracic and lumbar spine. Increased diffuse body wall edema. IMPRESSION: 1. Prior embolization of known intrahepatic cholangiocarcinoma with similar intrahepatic bile duct dilation with metallic biliary stent in-situ in unchanged position. Interval removal of plastic stent. 2. Increased moderate volume ascites and diffuse body wall edema. 3. New small right pleural effusion. 4. Decreased conspicuity of previously noted omental and mesenteric nodularity. Attention on follow-up. 5.  Aortic Atherosclerosis (ICD10-I70.0). Electronically Signed   By: Agustin Cree M.D.   On: 11/12/2022 12:57   ECHOCARDIOGRAM COMPLETE  Result Date: 11/10/2022    ECHOCARDIOGRAM REPORT   Patient Name:   SHLOIMA [facility]IN[facility] Date of Exam: 11/10/2022  Medical Rec #:  [id number]       Height:       71.0 in Accession #:    [id number]      Weight:       167.1 lb Date of Birth:  15-Jan-1956       BSA:          1.954 m Patient Age:    66 years        BP:           101/72 mmHg Patient Gender: M               HR:           74 bpm. Exam Location:  Inpatient Procedure: 2D Echo, Color Doppler, Cardiac Doppler and Intracardiac            Opacification Agent Indications:    Bacteremia  History:        Patient has prior history of Echocardiogram examinations, most                 recent 09/14/2022. CHF, CAD; Risk Factors:Hypertension, Diabetes                 and Dyslipidemia.  Sonographer:    Irving Burton Senior RDCS Referring Phys: [phone] RALPH A NETTEY  Sonographer Comments: Technically difficult  due to significantly more lung interference than prior studies. IMPRESSIONS  1. Left ventricular ejection fraction, by estimation, is 40 to 45%. The left ventricle has mildly decreased function. The left ventricle demonstrates regional wall motion abnormalities (see scoring diagram/findings for description). Left ventricular diastolic parameters are consistent with Grade I diastolic dysfunction (impaired relaxation).  2. Right ventricular systolic function is normal. The right ventricular size is normal.  3. The mitral valve is grossly normal. Trivial mitral valve regurgitation.  4. The aortic valve is grossly normal. Aortic valve regurgitation is not visualized. Comparison(s): No significant change from prior study. Conclusion(s)/Recommendation(s): No evidence of valvular vegetations on this transthoracic echocardiogram. Consider a transesophageal echocardiogram to exclude infective endocarditis if clinically indicated. FINDINGS  Left Ventricle: Left ventricular ejection fraction, by estimation, is 40 to 45%. The left ventricle has mildly decreased function. The left ventricle demonstrates regional wall motion abnormalities. Definity contrast agent was given IV to delineate the left  ventricular endocardial borders. The left ventricular internal cavity size was normal in size. There is no left ventricular hypertrophy. Left ventricular diastolic parameters are consistent with Grade I diastolic dysfunction (impaired relaxation).  LV Wall Scoring: The basal inferior segment is aneurysmal. The basal inferolateral segment is dyskinetic. The mid inferolateral segment, mid inferoseptal segment, mid inferior segment, and basal inferoseptal segment are hypokinetic. The entire anterior wall, antero-lateral wall, entire anterior septum, and entire apex are normal. Right Ventricle: The right ventricular size is normal. Right vetricular wall thickness was not well visualized. Right ventricular systolic function is normal. Left Atrium: Left atrial size was normal in size. Right Atrium: Right atrial size was normal in size. Pericardium: There is no evidence of pericardial effusion. Mitral Valve: The mitral valve is grossly normal. Trivial mitral valve regurgitation. Tricuspid Valve: The tricuspid valve is not well visualized. Tricuspid valve regurgitation is not demonstrated. Aortic Valve: The aortic valve is grossly normal. Aortic valve regurgitation is not visualized. Pulmonic Valve: The pulmonic valve was not well visualized. Pulmonic valve regurgitation is not visualized. Aorta: The aortic root was not well visualized, the ascending aorta was not well visualized and the aortic arch was not well visualized. IAS/Shunts: No atrial level shunt detected by color flow Doppler.   LV Volumes (MOD) LV vol d, MOD A2C: 191.0 ml Diastology LV vol d, MOD A4C: 143.0 ml LV e' medial:    4.35 cm/s LV vol s, MOD A2C: 119.0 ml LV E/e' medial:  14.5 LV vol s, MOD A4C: 70.2 ml  LV e' lateral:   4.24 cm/s LV SV MOD A2C:     72.0 ml  LV E/e' lateral: 14.9 LV SV MOD A4C:     143.0 ml LV SV MOD BP:      73.5 ml RIGHT VENTRICLE RV S prime:     12.90 cm/s TAPSE (M-mode): 2.8 cm LEFT ATRIUM             Index        RIGHT ATRIUM            Index LA Vol (A2C):   47.1 ml 24.11 ml/m  RA Area:     11.20 cm LA Vol (A4C):   41.9 ml 21.45 ml/m  RA Volume:   21.60 ml  11.06 ml/m LA Biplane Vol: 48.1 ml 24.62 ml/m  AORTIC VALVE LVOT Vmax:   81.60 cm/s LVOT Vmean:  67.000 cm/s LVOT VTI:    0.179 m MITRAL VALVE MV Area (PHT): 4.77 cm    SHUNTS MV Decel Time: 159  msec    Systemic VTI: 0.18 m MV E velocity: 63.00 cm/s MV A velocity: 73.70 cm/s MV E/A ratio:  0.85 Riley Lam MD Electronically signed by Riley Lam MD Signature Date/Time: 11/10/2022/6:02:05 PM    Final    DG ERCP  Result Date: 11/09/2022 CLINICAL DATA:  ERCP EXAM: ERCP TECHNIQUE: Multiple spot images obtained with the fluoroscopic device and submitted for interpretation post-procedure. FLUOROSCOPY: Refer to separate report COMPARISON:  None Available. FINDINGS: A total of 13 fluoroscopic spot images taken during ERCP are submitted for review. Initial images demonstrate a scope overlying the upper abdomen. A plastic biliary stent appears to course through a metallic biliary stent. The plastic biliary stent is removed. Wire catheterization of the common bile duct is performed. Contrast injection opacifies the intrahepatic and extrahepatic biliary tree. The intrahepatic biliary ducts appear normal in caliber. Balloon sweeps are performed through the common bile duct. Final images demonstrate decompression of the intrahepatic biliary tree with passage of contrast material into the small bowel. IMPRESSION: ERCP images as described. Refer to separate procedure report for full details. These images were submitted for radiologic interpretation only. Please see the procedural report for the amount of contrast and the fluoroscopy time utilized. Electronically Signed   By: Olive Bass M.D.   On: 11/09/2022 14:54   US Paracentesis  Result Date: 11/08/2022 INDICATION: Cholangiocarcinoma with small volume ascites request for therapeutic paracentesis. EXAM: ULTRASOUND GUIDED  PARACENTESIS MEDICATIONS: 1% lidocaine 10 mL COMPLICATIONS: None immediate. PROCEDURE: Informed written consent was obtained from the patient after a discussion of the risks, benefits and alternatives to treatment. A timeout was performed prior to the initiation of the procedure. Initial ultrasound scanning demonstrates a small amount of ascites within the right upper abdomen. The right upper abdomen was prepped and draped in the usual sterile fashion. 1% lidocaine was used for local anesthesia. Following this, a 19 gauge, 7-cm, Yueh catheter was introduced. An ultrasound image was saved for documentation purposes. The paracentesis was performed. The catheter was removed and a dressing was applied. The patient tolerated the procedure well without immediate post procedural complication. Scratch FINDINGS: A total of approximately 1.2 L of clear yellow fluid was removed. Samples were sent to the laboratory as requested by the clinical team. IMPRESSION: Successful ultrasound-guided paracentesis yielding 1.2 liters of peritoneal fluid. Procedure performed by: Corrin Parker, PA-C Electronically Signed   By: Irish Lack M.D.   On: 11/08/2022 14:08   CT ABDOMEN PELVIS WO CONTRAST  Result Date: 11/07/2022 CLINICAL DATA:  Abdominal pain, acute, nonlocalized. Fever with weakness and dizziness for 3 days. Jaundice. History of intrahepatic cholangiocarcinoma. * Tracking Code: BO * EXAM: CT ABDOMEN AND PELVIS WITHOUT CONTRAST TECHNIQUE: Multidetector CT imaging of the abdomen and pelvis was performed following the standard protocol without IV contrast. RADIATION DOSE REDUCTION: This exam was performed according to the departmental dose-optimization program which includes automated exposure control, adjustment of the mA and/or kV according to patient size and/or use of iterative reconstruction technique. COMPARISON:  Abdominopelvic CT 08/28/2022. Abdominal MRI 10/19/2022. FINDINGS: Lower chest: 4 mm right middle lobe nodule  on image 4/4 is unchanged from the prior chest CT. There is central airway thickening at both lung bases with mild dependent right lower lobe atelectasis. No consolidation or enlarging nodule identified. Aortic and coronary artery atherosclerosis noted. 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 Hepatobiliary: Previous embolization procedure for intrahepatic cholangiocarcinoma noted centrally in the liver, with associated beam hardening artifact. No recurrent  focal mass lesion identified. Previously demonstrated known large gallstone is not well visualized. The gallbladder appears unchanged with mild wall thickening. A metallic biliary stent is in place with interval placement of a plastic biliary stent extending superiorly into the hepatic hilum and inferiorly into the ampulla. There is no pneumobilia, and the intrahepatic biliary dilatation appears slightly increased. Pancreas: Unremarkable. No pancreatic ductal dilatation or surrounding inflammatory changes. Spleen: Normal in size without focal abnormality. Adrenals/Urinary Tract: Both adrenal glands appear normal. No evidence of renal or ureteral calculus, hydronephrosis or perinephric soft tissue stranding. Both kidneys appear unremarkable. A calcified bladder calculus is again noted. Foley catheter extends into the bladder lumen. The bladder is decompressed. Stomach/Bowel: No enteric contrast administered. The stomach appears unremarkable for its degree of distention. There is possible mild small bowel wall thickening in the mid abdomen, but no significant bowel distension. Mild diverticulosis of the distal colon. The colon and appendix otherwise appear unremarkable. Vascular/Lymphatic: There are no enlarged abdominal or pelvic lymph nodes. Diffuse aortic and branch vessel atherosclerosis with chronically displaced intimal calcifications within the infrarenal abdominal aorta. No focal aneurysm. Reproductive: The  prostate gland and seminal vesicles appear unremarkable. Other: Interval increased volume of ascites with mild nodularity throughout the omental and mesenteric fat, suspicious for carcinomatosis. No free intraperitoneal air. Musculoskeletal: No acute or significant osseous findings. Interval increased diffuse subcutaneous edema, asymmetric to the right. Stable mild lumbar spondylosis. IMPRESSION: 1. Interval increased volume of ascites with mild nodularity throughout the omental and mesenteric fat, suspicious for carcinomatosis. 2. Interval placement of a plastic biliary stent through the metallic wall stent which appears well positioned. However, there is increased intrahepatic biliary dilatation and no pneumobilia, suspicious for stent dysfunction. Correlate with liver function studies. 3. Previous embolization procedure for intrahepatic cholangiocarcinoma. No recurrent focal mass lesion identified. 4. No evidence of bowel obstruction or perforation. Possible mild small bowel wall thickening in the mid abdomen. 5. Stable 4 mm right middle lobe pulmonary nodule. 6. Stable calcified bladder calculus. 7. Interval increased diffuse subcutaneous edema, asymmetric to the right. 8.  Aortic Atherosclerosis (ICD10-I70.0). Electronically Signed   By: Carey Bullocks M.D.   On: 11/07/2022 14:05   DG Chest Port 1 View  Result Date: 11/07/2022 CLINICAL DATA:  Questionable sepsis - evaluate for abnormality EXAM: PORTABLE CHEST - 1 VIEW COMPARISON:  04/09/2022 FINDINGS: Right arm PICC line has been placed to the distal SVC, the right IJ port removed. Lungs are clear. Heart size and mediastinal contours are within normal limits. No effusion. Visualized bones unremarkable. IMPRESSION: No acute cardiopulmonary disease. Electronically Signed   By: Corlis Leak M.D.   On: 11/07/2022 12:03    Microbiology: Results for orders placed or performed during the hospital encounter of 11/30/22  Culture, blood (Routine X 2) w Reflex to  ID Panel     Status: None (Preliminary result)   Collection Time: 11/30/22  1:00 PM   Specimen: Left Antecubital; Blood  Result Value Ref Range Status   Specimen Description   Final    LEFT ANTECUBITAL BOTTLES DRAWN AEROBIC AND ANAEROBIC   Special Requests   Final    Blood Culture results may not be optimal due to an excessive volume of blood received in culture bottles   Culture   Final    NO GROWTH 2 DAYS Performed at [facility], [address], [place], [place] [address]    Report Status PENDING  Incomplete  Culture, blood (Routine X 2) w Reflex to ID Panel  Status: None (Preliminary result)   Collection Time: 11/30/22  1:00 PM   Specimen: BLOOD LEFT ARM  Result Value Ref Range Status   Specimen Description BLOOD LEFT ARM AEROBIC BOTTLE ONLY  Final   Special Requests Blood Culture adequate volume  Final   Culture   Final    NO GROWTH 2 DAYS Performed at [facility], [address] [place], [place] [address]    Report Status PENDING  Incomplete  Culture, Urine (Do not remove urinary catheter, catheter placed by urology or difficult to place)     Status: None   Collection Time: 11/30/22  5:40 PM   Specimen: Urine, Catheterized  Result Value Ref Range Status   Specimen Description   Final    URINE, CATHETERIZED Performed at [facility], [address], [place], [place] [address]    Special Requests   Final    NONE Performed at [facility], [address] [place], [place] [address]    Culture   Final    NO GROWTH Performed at [facility], [address] [place], [place] [address]    Report Status 12/01/2022 FINAL  Final    Labs: CBC: Recent Labs  Lab 11/30/22 0745 11/30/22 1000 12/01/22 0441 12/02/22 0430  WBC 12.4* 11.3* 7.7 6.2  NEUTROABS 9.5* 9.3*  --   --   HGB 9.9* 9.5* 8.1* 7.6*  HCT 29.1* 28.3* 24.3* 22.8*  MCV 98.6 98.6 99.6 100.4*  PLT 156 137* 105* 88*   Basic Metabolic Panel: Recent Labs  Lab 11/30/22 0745 11/30/22 1000  11/30/22 1818 12/01/22 0441 12/02/22 0430  NA 124* 125* 126* 127* 129*  K 6.4* 6.8* 5.8* 5.3* 4.6  CL 99 100 102 103 105  CO2 16* 16* 17* 16* 17*  GLUCOSE 135* 131* 133* 118* 128*  BUN 69* 70* 69* 69* 60*  CREATININE 2.98* 3.04* 3.00* 2.98* 2.92*  CALCIUM 8.3* 8.3* 8.1* 8.2* 8.2*  MG 2.0  --   --   --   --    Liver Function Tests: Recent Labs  Lab 11/30/22 0745 11/30/22 1000 12/01/22 0441 12/02/22 0430  AST 133* 125* 101* 101*  ALT 80* 76* 66* 66*  ALKPHOS 551* 518* 438* 429*  BILITOT 3.7* 3.6* 3.1* 3.1*  PROT 6.0* 5.7* 5.7* 5.3*  ALBUMIN 2.0* 1.9* 2.4* 2.5*   CBG: No results for input(s): "GLUCAP" in the last 168 hours.  Discharge time spent: greater than 30 minutes.  Signed: Catarina Hartshorn, MD [facility] 12/02/2022

## 2022-12-02 NOTE — Progress Notes (Signed)
Patient tolerated left sided thoracentesis well today and 4 Liters of cloudy yellow ascites removed. Patient verbalized understanding of post procedure instructions and returned to inpatient bed assignment at this time via stretcher with no acute distress noted.

## 2022-12-02 NOTE — Progress Notes (Signed)
Patient came in with picc line. Questioned MD Tat about taking picc line out before discharge. MD Tat states it is up to family. Family agreed to go home with it.

## 2022-12-02 NOTE — Procedures (Signed)
PROCEDURE SUMMARY:  Successful US guided paracentesis from left abdomen.  Yielded 4 L of cloudy yellow fluid.  No immediate complications.  Pt tolerated well.   Specimen not sent for labs.  EBL < 2 mL  Mickie Kay, NP 12/02/2022 3:44 PM

## 2022-12-02 NOTE — TOC Transition Note (Signed)
Transition of Care [facility]) - CM/SW Discharge Note   Patient Details  Name: Jonathan Keith MRN: [id number] Date of Birth: 10-13-1955  Transition of Care [facility]) CM/SW Contact:  Karn Cassis, LCSW Phone Number: 12/02/2022, 2:06 PM   Clinical Narrative:  Pt d/c today. Morrie Sheldon with [facility] notified of d/c. HHPT/RN orders in. Discussed outpatient palliative with pt's wife who requests [facility]. Referral made and Samantha at [facility] aware will d/c today.      Final next level of care: Home w Home Health Services Barriers to Discharge: Barriers Resolved   Patient Goals and CMS Choice   Choice offered to / list presented to : Spouse  Discharge Placement                    Name of family member notified: wife Patient and family notified of of transfer: 12/02/22  Discharge Plan and Services Additional resources added to the After Visit Summary for   In-house Referral: Clinical Social Work   Post Acute Care Choice: Resumption of Svcs/PTA Provider                    HH Arranged: Charity fundraiser, PT [facility] Agency: [facility] Date HH Agency Contacted: 12/02/22 Time HH Agency Contacted: [phone] Representative spoke with at [facility] Agency: Morrie Sheldon  Social Determinants of Health (SDOH) Interventions SDOH Screenings   Food Insecurity: No Food Insecurity (11/30/2022)  Housing: Low Risk  (11/30/2022)  Transportation Needs: No Transportation Needs (11/30/2022)  Utilities: Not At Risk (11/30/2022)  Alcohol Screen: Low Risk  (03/07/2021)  Depression (PHQ2-9): Low Risk  (03/07/2021)  Financial Resource Strain: Medium Risk (03/07/2021)  Physical Activity: Insufficiently Active (03/07/2021)  Social Connections: Moderately Integrated (03/07/2021)  Stress: No Stress Concern Present (03/07/2021)  Tobacco Use: High Risk (11/30/2022)     Readmission Risk Interventions    12/01/2022    8:25 AM 11/12/2022    2:39 PM 10/23/2022    2:35 PM  Readmission Risk Prevention Plan  Transportation  Screening Complete Complete Complete  PCP or Specialist Appt within 3-5 Days  Complete Complete  HRI or Home Care Consult  Complete Complete  Social Work Consult for Recovery Care Planning/Counseling  Complete Complete  Palliative Care Screening  Complete Complete  Medication Review Oceanographer) Complete Complete Complete  HRI or Home Care Consult Complete    SW Recovery Care/Counseling Consult Complete    Palliative Care Screening Complete    Skilled Nursing Facility Not Applicable

## 2022-12-02 NOTE — Progress Notes (Signed)
Patient slept on and off this shift, he did not complain of pain, but did state this bed is not comfortable, repositioned patient with pillows, this morning patient sat up on side of bed with feet on the floor. Continued to monitor.

## 2022-12-03 ENCOUNTER — Inpatient Hospital Stay: Payer: Medicare Other

## 2022-12-03 ENCOUNTER — Other Ambulatory Visit: Payer: Self-pay | Admitting: *Deleted

## 2022-12-03 DIAGNOSIS — R180 Malignant ascites: Secondary | ICD-10-CM

## 2022-12-03 DIAGNOSIS — C221 Intrahepatic bile duct carcinoma: Secondary | ICD-10-CM

## 2022-12-03 LAB — CULTURE, BLOOD (ROUTINE X 2)

## 2022-12-03 NOTE — Progress Notes (Signed)
   12/02/22 1400  Spiritual Encounters  Type of Visit Initial  Care provided to: Patient;Family  Conversation partners present during encounter Nurse  Referral source APP  Reason for visit Goals of care meeting  OnCall Visit No  Spiritual Framework  Presenting Themes Meaning/purpose/sources of inspiration;Impactful experiences and emotions;Values and beliefs;Significant life change  Community/Connection Family;Friend(s)  Patient Stress Factors Major life changes;Loss of control  Family Stress Factors None identified  Goals  Self/Personal Goals he wants to go home and does not want to come back to the hospital as an inpatient. He states that he only would come back to drain fluid outpatient.  Interventions  Spiritual Care Interventions Made Established relationship of care and support;Compassionate presence;Reflective listening;Prayer  Intervention Outcomes  Outcomes Connection to spiritual care;Awareness of support;Reduced anxiety;Autonomy/agency  Spiritual Care Plan  Spiritual Care Issues Still Outstanding No further spiritual care needs at this time (see row info)   Referred to patient via IDT rounds today. Found patient in sitting position in hospital bed with wife and children surrounding him. When Chaplain walked in wife asked about whether he could go home because they have decided NOT to have hospice care at home and to utilize existing home health agency for in home care. Chaplain talked with her about the possibility of adding Palliative Care in addition to this plan and she and patient agreed to this. Chaplain provided support and coordination of care in order to expedite his discharge with Palliative and Physician. No further spiritual care needs noted at this time. Chaplain provided blessing and compassionate presence.   Rev. Jolyn Lent, M.Div. Chaplain

## 2022-12-04 LAB — CULTURE, BLOOD (ROUTINE X 2)

## 2022-12-04 SURGERY — INSERTION, CATHETER, PERITONEAL
Anesthesia: Monitor Anesthesia Care

## 2022-12-07 ENCOUNTER — Inpatient Hospital Stay: Payer: Medicare Other

## 2022-12-07 VITALS — BP 67/48 | HR 75 | Temp 97.2°F | Resp 18

## 2022-12-07 DIAGNOSIS — R531 Weakness: Secondary | ICD-10-CM | POA: Diagnosis not present

## 2022-12-07 DIAGNOSIS — R180 Malignant ascites: Secondary | ICD-10-CM | POA: Diagnosis not present

## 2022-12-07 DIAGNOSIS — K5730 Diverticulosis of large intestine without perforation or abscess without bleeding: Secondary | ICD-10-CM | POA: Diagnosis not present

## 2022-12-07 DIAGNOSIS — K8021 Calculus of gallbladder without cholecystitis with obstruction: Secondary | ICD-10-CM | POA: Diagnosis not present

## 2022-12-07 DIAGNOSIS — I509 Heart failure, unspecified: Secondary | ICD-10-CM | POA: Diagnosis not present

## 2022-12-07 DIAGNOSIS — Z452 Encounter for adjustment and management of vascular access device: Secondary | ICD-10-CM

## 2022-12-07 DIAGNOSIS — R7881 Bacteremia: Secondary | ICD-10-CM | POA: Diagnosis not present

## 2022-12-07 DIAGNOSIS — R42 Dizziness and giddiness: Secondary | ICD-10-CM | POA: Diagnosis not present

## 2022-12-07 DIAGNOSIS — F17210 Nicotine dependence, cigarettes, uncomplicated: Secondary | ICD-10-CM | POA: Diagnosis not present

## 2022-12-07 DIAGNOSIS — Z79899 Other long term (current) drug therapy: Secondary | ICD-10-CM | POA: Diagnosis not present

## 2022-12-07 DIAGNOSIS — N210 Calculus in bladder: Secondary | ICD-10-CM | POA: Diagnosis not present

## 2022-12-07 DIAGNOSIS — I700 Atherosclerosis of aorta: Secondary | ICD-10-CM | POA: Diagnosis not present

## 2022-12-07 DIAGNOSIS — I959 Hypotension, unspecified: Secondary | ICD-10-CM

## 2022-12-07 DIAGNOSIS — M47816 Spondylosis without myelopathy or radiculopathy, lumbar region: Secondary | ICD-10-CM | POA: Diagnosis not present

## 2022-12-07 DIAGNOSIS — I2510 Atherosclerotic heart disease of native coronary artery without angina pectoris: Secondary | ICD-10-CM | POA: Diagnosis not present

## 2022-12-07 DIAGNOSIS — C221 Intrahepatic bile duct carcinoma: Secondary | ICD-10-CM | POA: Diagnosis present

## 2022-12-07 DIAGNOSIS — M47814 Spondylosis without myelopathy or radiculopathy, thoracic region: Secondary | ICD-10-CM | POA: Diagnosis not present

## 2022-12-07 DIAGNOSIS — Z5986 Financial insecurity: Secondary | ICD-10-CM | POA: Diagnosis not present

## 2022-12-07 MED ORDER — SODIUM CHLORIDE 0.9% FLUSH
10.0000 mL | Freq: Once | INTRAVENOUS | Status: AC
  Administered 2022-12-07: 10 mL via INTRAVENOUS

## 2022-12-07 MED ORDER — HEPARIN SOD (PORK) LOCK FLUSH 100 UNIT/ML IV SOLN
250.0000 [IU] | Freq: Once | INTRAVENOUS | Status: AC
  Administered 2022-12-07: 250 [IU] via INTRAVENOUS

## 2022-12-07 MED ORDER — SODIUM CHLORIDE 0.9 % IV SOLN: INTRAVENOUS | Status: DC

## 2022-12-07 NOTE — Progress Notes (Signed)
Hydration fluids given per orders. Patient tolerated it well without problems. Vitals stable and discharged home from clinic via wheelchair. Follow up as scheduled.  

## 2022-12-07 NOTE — Progress Notes (Signed)
PICC flushed with good blood return noted. No bruising or swelling at site. PICC dressing changed. Patient's BP 65/48 & 67/48 patient states he is only dizzy when he stands up. Per Dr. Ellin Saba give over 1 hr.

## 2022-12-07 NOTE — Patient Instructions (Signed)
[facility]  Discharge Instructions: Thank you for choosing [facility] to provide your oncology and hematology care.  If you have a lab appointment with the Cancer Center - please note that after April 8th, 2024, all labs will be drawn in the cancer center.  You do not have to check in or register with the main entrance as you have in the past but will complete your check-in in the cancer center.  Wear comfortable clothing and clothing appropriate for easy access to any Portacath or PICC line.   We strive to give you quality time with your provider. You may need to reschedule your appointment if you arrive late (15 or more minutes).  Arriving late affects you and other patients whose appointments are after yours.  Also, if you miss three or more appointments without notifying the office, you may be dismissed from the clinic at the provider's discretion.      For prescription refill requests, have your pharmacy contact our office and allow 72 hours for refills to be completed.    Today you received the following, saline hydration fluids   To help prevent nausea and vomiting after your treatment, we encourage you to take your nausea medication as directed.  BELOW ARE SYMPTOMS THAT SHOULD BE REPORTED IMMEDIATELY: *FEVER GREATER THAN 100.4 F (38 C) OR HIGHER *CHILLS OR SWEATING *NAUSEA AND VOMITING THAT IS NOT CONTROLLED WITH YOUR NAUSEA MEDICATION *UNUSUAL SHORTNESS OF BREATH *UNUSUAL BRUISING OR BLEEDING *URINARY PROBLEMS (pain or burning when urinating, or frequent urination) *BOWEL PROBLEMS (unusual diarrhea, constipation, pain near the anus) TENDERNESS IN MOUTH AND THROAT WITH OR WITHOUT PRESENCE OF ULCERS (sore throat, sores in mouth, or a toothache) UNUSUAL RASH, SWELLING OR PAIN  UNUSUAL VAGINAL DISCHARGE OR ITCHING   Items with * indicate a potential emergency and should be followed up as soon as possible or go to the Emergency Department if any  problems should occur.  Please show the CHEMOTHERAPY ALERT CARD or IMMUNOTHERAPY ALERT CARD at check-in to the Emergency Department and triage nurse.  Should you have questions after your visit or need to cancel or reschedule your appointment, please contact [facility] [phone]  and follow the prompts.  Office hours are 8:00 a.m. to 4:30 p.m. Monday - Friday. Please note that voicemails left after 4:00 p.m. may not be returned until the following business day.  We are closed weekends and major holidays. You have access to a nurse at all times for urgent questions. Please call the main number to the clinic [phone] and follow the prompts.  For any non-urgent questions, you may also contact your provider using MyChart. We now offer e-Visits for anyone 73 and older to request care online for non-urgent symptoms. For details visit [email or web address].   Also download the MyChart app! Go to the app store, search "MyChart", open the app, select [place], and log in with your MyChart username and password.

## 2022-12-08 ENCOUNTER — Other Ambulatory Visit: Payer: Self-pay | Admitting: Hematology

## 2022-12-08 DIAGNOSIS — C221 Intrahepatic bile duct carcinoma: Secondary | ICD-10-CM

## 2022-12-08 DIAGNOSIS — R180 Malignant ascites: Secondary | ICD-10-CM

## 2022-12-09 ENCOUNTER — Encounter (HOSPITAL_COMMUNITY): Payer: Self-pay

## 2022-12-09 ENCOUNTER — Ambulatory Visit (HOSPITAL_COMMUNITY): Payer: Medicare Other

## 2022-12-09 ENCOUNTER — Ambulatory Visit (HOSPITAL_COMMUNITY): Admission: RE | Admit: 2022-12-09 | Payer: Medicare Other | Source: Ambulatory Visit

## 2022-12-09 ENCOUNTER — Ambulatory Visit (HOSPITAL_COMMUNITY)
Admission: RE | Admit: 2022-12-09 | Discharge: 2022-12-09 | Disposition: A | Discharge status: Home / Self Care | Payer: Medicare Other | Source: Ambulatory Visit | Attending: Hematology | Admitting: Hematology

## 2022-12-09 DIAGNOSIS — R180 Malignant ascites: Secondary | ICD-10-CM

## 2022-12-09 DIAGNOSIS — C221 Intrahepatic bile duct carcinoma: Secondary | ICD-10-CM

## 2022-12-09 MED ORDER — ALBUMIN HUMAN 25 % IV SOLN
INTRAVENOUS | Status: AC
  Filled 2022-12-09: qty 200

## 2022-12-09 MED ORDER — ALBUMIN HUMAN 25 % IV SOLN
50.0000 g | Freq: Once | INTRAVENOUS | Status: AC
  Administered 2022-12-09: 50 g via INTRAVENOUS

## 2022-12-09 NOTE — Progress Notes (Signed)
Patient tolerated left sided paracentesis procedure and 50G of IV albumin well today and 6 Liters of cloudy yellow fluid removed. Patient verbalized understanding of discharge instructions and left with family from waiting area at this time via wheelchair with no acute distress noted.

## 2022-12-09 NOTE — Procedures (Signed)
PROCEDURE SUMMARY:  Successful image-guided paracentesis from the left lower abdomen.  Yielded 6.0 liters of hazy yellow fluid.  No immediate complications.  EBL < 1 mL Patient tolerated well.   Specimen was not sent for labs.  Please see imaging section of Epic for full dictation.  Villa Herb PA-C 12/09/2022 10:34 AM

## 2022-12-13 NOTE — Progress Notes (Signed)
[facility] [address] [place] [address]    Clinic Day:  12/14/2022  Referring physician: Elfredia Nevins, MD  Patient Care Team: Elfredia Nevins, MD as PCP - General (Internal Medicine) Mallipeddi, Jonathan Modest, MD as PCP - Cardiology (Cardiology) Jonathan Massed, MD as Medical Oncologist (Medical Oncology)   ASSESSMENT & PLAN:   Assessment: 1. Metastatic cholangiocarcinoma: - 02/2021 initial diagnosis presentation with painless jaundice - ERCP with sphincterotomy, subsequent ERCP with plastic biliary stent placement. - MRI-2 cm mass involving the common hepatic duct with upstream biliary dilatation. - 03/2021 consult at [facility] with GI and surgery-confirm diagnosis of perihilar cholangiocarcinoma - 03/21/2021-biopsy/pathology, ERCP-common hepatic duct cytology and brushings performed.  EUS-1 enlarged lymph node versus tumor implant in the gastrohepatic ligament-negative for malignancy.  Single biliary plastic stent removed and replaced with the plastic stent in the right and left ducts. - 04/17/2021-right portal vein embolization to promote left hepatic hypertrophy. - 07/02/2021-diagnostic laparoscopy with multiple areas concerning for metastatic blocks identified in the abdomen and peritoneal surfaces.  6 separate biopsies obtained. - Pathology consistent with metastatic carcinoma. - 37 pound weight loss in the last 5 months. - Cycle 1 of gemcitabine, cisplatin, durvalumab on 07/22/2021. - ERCP (10/23/2022): Prior sphincterotomy open.  Single severe biliary stricture found in the left main hepatic duct, malignant appearing.  Stricture successfully dilated and a plastic biliary stent was placed into the left hepatic duct. - CARIS Assure: T p53 pathogenic variant.  TMB-low, MSI-not detected  2.  Social history/family: - Lives at home with his wife.  He worked as a Psychologist, forensic. - Current active smoker, 1 pack/day since age 20. - Maternal grandmother  had cancer.  Maternal uncle had brain tumor.  3.  Left ventricular thrombus (Dx 06/04/2022):: - Coumadin discontinued by cardiology on 09/14/2022 as repeat echo did not show LV thrombus.  Continue aspirin 81 mg daily. - Echocardiogram (11/10/2022): EF 40-45%.  Plan: 1. Metastatic cholangiocarcinoma: - He had a recent hospitalization.  Reviewed the records. - Creatinine today is 2.21 with LFTs improving.  Total bilirubin improved to 2.5. - We discussed options including best supportive care in the form of hospice versus active therapy.  He would like to continue treatment at this time. - Recommend switching to FOLFOX based treatment as we cannot give cisplatin and gemcitabine with his creatinine of 2.21. - We discussed FOLFOX regimen with the side effects in detail. - Tentatively we will start next week with 20% dose reduction with cycle 1. - He has hyperkalemia with potassium of 5.7.  Discussed to stop drinking fruit juices and foods rich in potassium.  He has an appointment to see Dr. Wolfgang Phoenix on 12/21/2022.  We will give Kayexalate 60 g p.o. x 1. - We will also plan for port placement which was removed due to infection.  He has a PICC line at this time.  2.  Abdominal distention: - Paracentesis on 12/02/2022 with 4 L drained. - Paracentesis on 12/09/2022 with 6 L drained. - Lasix on hold due to hypotension.  3.  Diabetes: - Because of worsening creatinine, will hold metformin. - Will start him on glipizide 5 mg daily with breakfast.  4.  Hypotension: - He is taking midodrine 5 mg 3 times daily.  Blood pressure today is 86/58. - Will increase midodrine to 10 mg 3 times daily.    Orders Placed This Encounter  Procedures   IR IMAGING GUIDED PORT INSERTION    Standing Status:   Future  Standing Expiration Date:   12/14/2023    Order Specific Question:   Reason for Exam (SYMPTOM  OR DIAGNOSIS REQUIRED)    Answer:   chemotherapy administration    Order Specific Question:   Preferred Imaging  Location?    Answer:   [facility]      I,Katie Daubenspeck,acting as a scribe for Jonathan Massed, MD.,have documented all relevant documentation on the behalf of Jonathan Massed, MD,as directed by  Jonathan Massed, MD while in the presence of Jonathan Massed, MD.   I, Jonathan Massed MD, have reviewed the above documentation for accuracy and completeness, and I agree with the above.   Jonathan Massed, MD   5/6/20246:42 PM  CHIEF COMPLAINT:   Diagnosis: intrahepatic cholangiocarcinoma    Cancer Staging  Cholangiocarcinoma [facility]) Staging form: Perihilar Bile Ducts, AJCC 8th Edition - Clinical stage from 07/17/2021: Stage IVB (cTX, cNX, pM1) - Signed by Jonathan Massed, MD on 07/17/2021    Prior Therapy: none  Current Therapy:  Cisplatin + Gemcitabine D1, D15 q 28d    HISTORY OF PRESENT ILLNESS:   Oncology History  Cholangiocarcinoma (HCC)  02/28/2021 Initial Diagnosis   Cholangiocarcinoma (HCC)   07/17/2021 Cancer Staging   Staging form: Perihilar Bile Ducts, AJCC 8th Edition - Clinical stage from 07/17/2021: Stage IVB (cTX, cNX, pM1) - Signed by Jonathan Massed, MD on 07/17/2021 Stage prefix: Initial diagnosis   07/22/2021 - 04/01/2022 Chemotherapy   Patient is on Treatment Plan : BILIARY TRACT Cisplatin + Gemcitabine D1,15 + Imfinzi day 1 q28d     07/22/2021 -  Chemotherapy   Patient is on Treatment Plan : BILIARY TRACT IMFINZI D1  +  Cisplatin + Gemcitabine  D1, 15 q28d        INTERVAL HISTORY:   Peirre is a 67 y.o. male presenting to clinic today for follow up of intrahepatic cholangiocarcinoma . He was last seen by me on 11/30/22.  At his last visit, I referred him to the ED for further evaluation and treatment of his hyperkalemia. He was subsequently admitted. Of note, they discussed Pleurex catheter placement, and patient and family ultimately declined.  His most recent paracentesis was on 12/09/22.  Today, he states that  he is doing well overall. His appetite level is at 100%. His energy level is at 10%.  PAST MEDICAL HISTORY:   Past Medical History: Past Medical History:  Diagnosis Date   Cancer (HCC)    CHF (congestive heart failure) (HCC)    Chronic pain    neck, back, knees   Class 1 obesity due to excess calories with body mass index (BMI) of 31.0 to 31.9 in adult    Claudication [facility])    Coronary artery disease    DDD (degenerative disc disease), cervical    Diabetes mellitus (HCC)    Dyslipidemia    Hypertension    Port-A-Cath in place 07/20/2021   Tobacco dependence     Surgical History: Past Surgical History:  Procedure Laterality Date   BILIARY BRUSHING N/A 02/25/2021   Procedure: BILIARY BRUSHING;  Surgeon: Malissa Hippo, MD;  Location: [facility] ORS;  Service: Gastroenterology;  Laterality: N/A;   BILIARY DILATION  10/23/2022   Procedure: BILIARY DILATION;  Surgeon: Meridee Score Netty Starring., MD;  Location: [facility] ENDOSCOPY;  Service: Gastroenterology;;   BILIARY STENT PLACEMENT N/A 02/25/2021   Procedure: BILIARY STENT PLACEMENT;  Surgeon: Malissa Hippo, MD;  Location: [facility] ORS;  Service: Gastroenterology;  Laterality: N/A;   BILIARY STENT PLACEMENT  02/27/2021  Procedure: BILIARY STENT PLACEMENT (10FR x 9cm) IN THE RIGHT SYSTEM;  Surgeon: Malissa Hippo, MD;  Location: [facility];  Service: Gastroenterology;;   BILIARY STENT PLACEMENT  10/23/2022   Procedure: BILIARY STENT PLACEMENT;  Surgeon: Lemar Lofty., MD;  Location: [facility] ENDOSCOPY;  Service: Gastroenterology;;   BREAST SURGERY Left    benign lump- in his 77s.   CARDIAC CATHETERIZATION     CYSTOSCOPY WITH LITHOLAPAXY N/A 11/19/2022   Procedure: CYSTOSCOPY WITH LITHOLAPAXY;  Surgeon: Malen Gauze, MD;  Location: [facility];  Service: Urology;  Laterality: N/A;   ERCP N/A 02/25/2021   Procedure: ENDOSCOPIC RETROGRADE CHOLANGIOPANCREATOGRAPHY (ERCP);  Surgeon: Malissa Hippo, MD;  Location: [facility];  Service: Gastroenterology;   Laterality: N/A;   ERCP N/A 02/27/2021   Procedure: ENDOSCOPIC RETROGRADE CHOLANGIOPANCREATOGRAPHY (ERCP);  Surgeon: Malissa Hippo, MD;  Location: [facility];  Service: Gastroenterology;  Laterality: N/A;   ERCP N/A 10/23/2022   Procedure: ENDOSCOPIC RETROGRADE CHOLANGIOPANCREATOGRAPHY (ERCP);  Surgeon: Lemar Lofty., MD;  Location: [facility] ENDOSCOPY;  Service: Gastroenterology;  Laterality: N/A;   ERCP N/A 11/09/2022   Procedure: ENDOSCOPIC RETROGRADE CHOLANGIOPANCREATOGRAPHY (ERCP);  Surgeon: Meryl Dare, MD;  Location: [facility] ENDOSCOPY;  Service: Gastroenterology;  Laterality: N/A;  Stent removal   HOLMIUM LASER APPLICATION N/A 11/19/2022   Procedure: HOLMIUM LASER APPLICATION;  Surgeon: Malen Gauze, MD;  Location: [facility];  Service: Urology;  Laterality: N/A;   IR IMAGING GUIDED PORT INSERTION  07/21/2021   IR PARACENTESIS  11/13/2022   IR REMOVAL TUN ACCESS W/ PORT W/O FL MOD SED  09/30/2022   KNEE SURGERY Left    x2   LEFT HEART CATH AND CORONARY ANGIOGRAPHY N/A 09/08/2021   Procedure: LEFT HEART CATH AND CORONARY ANGIOGRAPHY;  Surgeon: Orbie Pyo, MD;  Location: [facility] INVASIVE CV LAB;  Service: Cardiovascular;  Laterality: N/A;   REMOVAL OF STONES  10/23/2022   Procedure: REMOVAL OF STONES;  Surgeon: Meridee Score Netty Starring., MD;  Location: [facility] ENDOSCOPY;  Service: Gastroenterology;;   REMOVAL OF STONES  11/09/2022   Procedure: REMOVAL OF STONES;  Surgeon: Meryl Dare, MD;  Location: [facility] ENDOSCOPY;  Service: Gastroenterology;;   SPHINCTEROTOMY N/A 02/25/2021   Procedure: Dennison Mascot;  Surgeon: Malissa Hippo, MD;  Location: [facility];  Service: Gastroenterology;  Laterality: N/A;   STENT REMOVAL  02/27/2021   Procedure: STENT REMOVAL (8.5Fr x 9cm);  Surgeon: Malissa Hippo, MD;  Location: [facility];  Service: Gastroenterology;;   Francine Graven REMOVAL  11/09/2022   Procedure: STENT REMOVAL;  Surgeon: Meryl Dare, MD;  Location: [facility] ENDOSCOPY;  Service: Gastroenterology;;     Social History: Social History   Socioeconomic History   Marital status: Married    Spouse name: Not on file   Number of children: Not on file   Years of education: Not on file   Highest education level: Not on file  Occupational History   Occupation: Heavy Arboriculturist  Tobacco Use   Smoking status: Every Day    Packs/day: 0.50    Years: 56.00    Additional pack years: 0.00    Total pack years: 28.00    Types: Cigarettes   Smokeless tobacco: Never  Vaping Use   Vaping Use: Never used  Substance and Sexual Activity   Alcohol use: Not Currently    Comment: stopped 2.5 years ago 03/11/21   Drug use: Never   Sexual activity: Not on file  Other Topics Concern   Not on file  Social History  Narrative   Not on file   Social Determinants of Health   Financial Resource Strain: Medium Risk (03/07/2021)   Overall Financial Resource Strain (CARDIA)    Difficulty of Paying Living Expenses: Somewhat hard  Food Insecurity: No Food Insecurity (11/30/2022)   Hunger Vital Sign    Worried About Running Out of Food in the Last Year: Never true    Ran Out of Food in the Last Year: Never true  Transportation Needs: No Transportation Needs (11/30/2022)   PRAPARE - Administrator, Civil Service (Medical): No    Lack of Transportation (Non-Medical): No  Physical Activity: Insufficiently Active (03/07/2021)   Exercise Vital Sign    Days of Exercise per Week: 2 days    Minutes of Exercise per Session: 20 min  Stress: No Stress Concern Present (03/07/2021)   [employer] of Occupational Health - Occupational Stress Questionnaire    Feeling of Stress : Only a little  Social Connections: Moderately Integrated (03/07/2021)   Social Connection and Isolation Panel [NHANES]    Frequency of Communication with Friends and Family: More than three times a week    Frequency of Social Gatherings with Friends and Family: More than three times a week    Attends Religious Services:  More than 4 times per year    Active Member of [employer] or Organizations: No    Attends Banker Meetings: Never    Marital Status: Married  Catering manager Violence: Not At Risk (11/30/2022)   Humiliation, Afraid, Rape, and Kick questionnaire    Fear of Current or Ex-Partner: No    Emotionally Abused: No    Physically Abused: No    Sexually Abused: No    Family History: Family History  Problem Relation Age of Onset   CAD Mother    Cancer Neg Hx     Current Medications:  Current Outpatient Medications:    albuterol (VENTOLIN HFA) 108 (90 Base) MCG/ACT inhaler, Inhale 2 puffs into the lungs every 6 (six) hours as needed for wheezing or shortness of breath., Disp: 6.7 g, Rfl: 1   alfuzosin (UROXATRAL) 10 MG 24 hr tablet, Take 1 tablet (10 mg total) by mouth at bedtime., Disp: 30 tablet, Rfl: 11   carvedilol (COREG) 3.125 MG tablet, Take 3.125 mg by mouth 2 (two) times daily with a meal., Disp: , Rfl:    clindamycin (CLEOCIN) 300 MG capsule, Take 300 mg by mouth every 6 (six) hours., Disp: , Rfl:    doxycycline (VIBRA-TABS) 100 MG tablet, Take 100 mg by mouth 2 (two) times daily., Disp: , Rfl:    durvalumab 1,500 mg in sodium chloride 0.9 % 100 mL, Inject 1,500 mg into the vein every 21 ( twenty-one) days., Disp: , Rfl:    famotidine (PEPCID) 20 MG tablet, Take 20 mg by mouth 2 (two) times daily., Disp: , Rfl:    ferrous sulfate (FERROUSUL) 325 (65 FE) MG tablet, Take 1 tablet (325 mg total) by mouth daily with breakfast., Disp: , Rfl:    gemcitabine 760 mg/m2 in sodium chloride 0.9 % 100 mL, Inject 760 mg/m2 into the vein once a week. On days 1 and 8 every 21 days, Disp: , Rfl:    glipiZIDE (GLUCOTROL) 5 MG tablet, Take 1 tablet (5 mg total) by mouth daily before breakfast., Disp: 30 tablet, Rfl: 3   guaifenesin (ROBITUSSIN) 100 MG/5ML syrup, Take 200 mg by mouth 3 (three) times daily as needed for cough., Disp: , Rfl:  midodrine (PROAMATINE) 5 MG tablet, Take 1 tablet (5 mg  total) by mouth 3 (three) times daily with meals., Disp: 270 tablet, Rfl: 3   Misc. Devices KIT, 1 Box by Does not apply route every 7 (seven) days., Disp: 1 kit, Rfl: 0   Multiple Vitamin (MULTIVITAMIN WITH MINERALS) TABS tablet, Take 1 tablet by mouth daily., Disp: 100 tablet, Rfl: 0   pantoprazole (PROTONIX) 40 MG tablet, Take 1 tablet (40 mg total) by mouth daily., Disp: 90 tablet, Rfl: 3   prochlorperazine (COMPAZINE) 10 MG tablet, Take 1 tablet (10 mg total) by mouth every 6 (six) hours as needed for nausea or vomiting., Disp: 90 tablet, Rfl: 2   sodium chloride flush (NS) 0.9 % SOLN, 10 mLs by Intracatheter route every 7 (seven) days. (Patient taking differently: 10 mLs by Intracatheter route daily.), Disp: 200 mL, Rfl: 3   Wound Dressings (INTRASITE GEL APPLIPAK) GEL, Apply 1 Units topically daily., Disp: 30 g, Rfl: 0   ondansetron (ZOFRAN) 4 MG tablet, Take 1 tablet (4 mg total) by mouth every 6 (six) hours as needed for nausea., Disp: 20 tablet, Rfl: 0   Allergies: No Known Allergies  REVIEW OF SYSTEMS:   Review of Systems  Constitutional:  Negative for chills, fatigue and fever.  HENT:   Negative for lump/mass, mouth sores, nosebleeds, sore throat and trouble swallowing.   Eyes:  Negative for eye problems.  Respiratory:  Positive for cough. Negative for shortness of breath.   Cardiovascular:  Negative for chest pain, leg swelling and palpitations.  Gastrointestinal:  Negative for abdominal pain, constipation, diarrhea, nausea and vomiting.  Genitourinary:  Negative for bladder incontinence, difficulty urinating, dysuria, frequency, hematuria and nocturia.   Musculoskeletal:  Negative for arthralgias, back pain, flank pain, myalgias and neck pain.  Skin:  Positive for itching. Negative for rash.  Neurological:  Positive for dizziness. Negative for headaches and numbness.  Hematological:  Bruises/bleeds easily.  Psychiatric/Behavioral:  Positive for sleep disturbance. Negative for  depression and suicidal ideas. The patient is not nervous/anxious.   All other systems reviewed and are negative.    VITALS:   Blood pressure (!) 89/58, pulse (!) 101, temperature 97.7 F (36.5 C), temperature source Oral, resp. rate 16, weight 147 lb 3.2 oz (66.8 kg), SpO2 100 %.  Wt Readings from Last 3 Encounters:  12/14/22 147 lb 3.2 oz (66.8 kg)  11/30/22 162 lb 4.1 oz (73.6 kg)  11/30/22 152 lb 3.2 oz (69 kg)    Body mass index is 20.53 kg/m.  Performance status (ECOG): 1 - Symptomatic but completely ambulatory  PHYSICAL EXAM:   Physical Exam Vitals and nursing note reviewed. Exam conducted with a chaperone present.  Constitutional:      Appearance: Normal appearance.  Cardiovascular:     Rate and Rhythm: Normal rate and regular rhythm.     Pulses: Normal pulses.     Heart sounds: Normal heart sounds.  Pulmonary:     Effort: Pulmonary effort is normal.     Breath sounds: Normal breath sounds.  Abdominal:     General: There is distension.     Palpations: Abdomen is soft. There is no hepatomegaly, splenomegaly or mass.     Tenderness: There is no abdominal tenderness.  Musculoskeletal:     Right lower leg: Edema present.     Left lower leg: Edema present.  Lymphadenopathy:     Cervical: No cervical adenopathy.     Right cervical: No superficial, deep or posterior cervical adenopathy.  Left cervical: No superficial, deep or posterior cervical adenopathy.     Upper Body:     Right upper body: No supraclavicular or axillary adenopathy.     Left upper body: No supraclavicular or axillary adenopathy.  Neurological:     General: No focal deficit present.     Mental Status: He is alert and oriented to person, place, and time.  Psychiatric:        Mood and Affect: Mood normal.        Behavior: Behavior normal.     LABS:      Latest Ref Rng & Units 12/14/2022    7:48 AM 12/02/2022    4:30 AM 12/01/2022    4:41 AM  CBC  WBC 4.0 - 10.5 K/uL 6.2  6.2  7.7    Hemoglobin 13.0 - 17.0 g/dL 8.5  7.6  8.1   Hematocrit 39.0 - 52.0 % 26.4  22.8  24.3   Platelets 150 - 400 K/uL 87  88  105       Latest Ref Rng & Units 12/14/2022    7:48 AM 12/02/2022    4:30 AM 12/01/2022    4:41 AM  CMP  Glucose 70 - 99 mg/dL 098  119  147   BUN 8 - 23 mg/dL 42  60  69   Creatinine 0.61 - 1.24 mg/dL 8.29  5.62  1.30   Sodium 135 - 145 mmol/L 126  129  127   Potassium 3.5 - 5.1 mmol/L 5.7  4.6  5.3   Chloride 98 - 111 mmol/L 107  105  103   CO2 22 - 32 mmol/L 13  17  16    Calcium 8.9 - 10.3 mg/dL 8.6  8.2  8.2   Total Protein 6.5 - 8.1 g/dL 5.6  5.3  5.7   Total Bilirubin 0.3 - 1.2 mg/dL 2.5  3.1  3.1   Alkaline Phos 38 - 126 U/L 400  429  438   AST 15 - 41 U/L 86  101  101   ALT 0 - 44 U/L 67  66  66      Lab Results  Component Value Date   CEA1 3.9 07/17/2021   /  CEA  Date Value Ref Range Status  07/17/2021 3.9 0.0 - 4.7 ng/mL Final    Comment:    (NOTE)                             Nonsmokers          <3.9                             Smokers             <5.6 Roche [employer] Electrochemiluminescence Immunoassay (ECLIA) Values obtained with different assay methods or kits cannot be used interchangeably.  Results cannot be interpreted as absolute evidence of the presence or absence of malignant disease. Performed At: [facility] [address] [place], [place] [id number] Jolene Schimke MD EX:[id number]    No results found for: "PSA1" Lab Results  Component Value Date   [phone] [id number] (H) 10/21/2022   No results found for: "CAN125"  No results found for: "TOTALPROTELP", "ALBUMINELP", "A1GS", "A2GS", "BETS", "BETA2SER", "GAMS", "MSPIKE", "SPEI" Lab Results  Component Value Date   TIBC 139 (L) 09/12/2021   TIBC 154 (L) 03/13/2021   FERRITIN 1,132 (H) 09/12/2021  FERRITIN 708 (H) 03/13/2021   IRONPCTSAT 16 (L) 09/12/2021   IRONPCTSAT 17 (L) 03/13/2021   No results found for: "LDH"   STUDIES:   US Paracentesis  Result Date:  12/09/2022 INDICATION: Patient with history of cholangiocarcinoma, recurrent ascites. Request for therapeutic paracentesis. EXAM: ULTRASOUND GUIDED THERAPEUTIC PARACENTESIS MEDICATIONS: 7 mL 1% lidocaine COMPLICATIONS: None immediate. PROCEDURE: Informed written consent was obtained from the patient after a discussion of the risks, benefits and alternatives to treatment. A timeout was performed prior to the initiation of the procedure. Initial ultrasound scanning demonstrates a large amount of ascites within the left lower abdominal quadrant. The left lower abdomen was prepped and draped in the usual sterile fashion. 1% lidocaine was used for local anesthesia. Following this, a 19 gauge, 7-cm, Yueh catheter was introduced. An ultrasound image was saved for documentation purposes. The paracentesis was performed. The catheter was removed and a dressing was applied. The patient tolerated the procedure well without immediate post procedural complication. Patient received post-procedure intravenous albumin; see nursing notes for details. FINDINGS: A total of approximately 6.0 L of hazy yellow fluid was removed. IMPRESSION: Successful ultrasound-guided paracentesis yielding 6.0 liters of peritoneal fluid. Performed by Lynnette Caffey, PA-C Electronically Signed   By: Ulyses Southward M.D.   On: 12/09/2022 12:51   US Paracentesis  Result Date: 12/02/2022 INDICATION: Patient with a history of cholangiocarcinoma with recurrent ascites. Interventional Radiology asked to perform a therapeutic paracentesis to 4L. EXAM: ULTRASOUND GUIDED PARACENTESIS MEDICATIONS: 1% lidocaine 10 ml COMPLICATIONS: None immediate. PROCEDURE: Informed written consent was obtained from the patient after a discussion of the risks, benefits and alternatives to treatment. A timeout was performed prior to the initiation of the procedure. Initial ultrasound scanning demonstrates a large amount of ascites within the left lower abdominal quadrant. The left  lower abdomen was prepped and draped in the usual sterile fashion. 1% lidocaine was used for local anesthesia. Following this, a 19 gauge, 7-cm, Yueh catheter was introduced. An ultrasound image was saved for documentation purposes. The paracentesis was performed. The catheter was removed and a dressing was applied. The patient tolerated the procedure well without immediate post procedural complication. Patient received post-procedure intravenous albumin; see nursing notes for details. FINDINGS: A total of approximately 4 L of cloudy yellow fluid was removed. IMPRESSION: Successful ultrasound-guided paracentesis yielding 4 liters of peritoneal fluid. Read by: Alwyn Ren, NP Electronically Signed   By: Ulyses Southward M.D.   On: 12/02/2022 16:00   US RENAL  Result Date: 12/01/2022 CLINICAL DATA:  Acute on chronic renal failure. EXAM: RENAL / URINARY TRACT ULTRASOUND COMPLETE COMPARISON:  CT scan of November 12, 2022. FINDINGS: Right Kidney: Renal measurements: 10.5 x 5.6 x 4.5 cm = volume: 138 mL. Increased echogenicity of renal parenchyma is noted suggesting medical renal disease. No mass or hydronephrosis visualized. Left Kidney: Renal measurements: 9.5 x 5.7 x 4.7 cm = volume: 132 mL. Increased echogenicity of renal parenchyma is noted suggesting medical renal disease. No mass or hydronephrosis visualized. Bladder: Decompressed secondary to Foley catheter. Other: Moderate ascites is noted. IMPRESSION: Increased echogenicity of renal parenchyma is noted bilaterally suggesting medical renal disease. No hydronephrosis or renal obstruction is noted. Moderate ascites. Electronically Signed   By: Lupita Raider M.D.   On: 12/01/2022 17:49   US Paracentesis  Result Date: 11/24/2022 INDICATION: Cholangiocarcinoma with recurrent ascites EXAM: ULTRASOUND GUIDED LEFT LOWER QUADRANT THERAPEUTIC PARACENTESIS MEDICATIONS: 4 cc 1% lidocaine COMPLICATIONS: None immediate. PROCEDURE: Informed written consent was obtained  from the patient  after a discussion of the risks, benefits and alternatives to treatment. A timeout was performed prior to the initiation of the procedure. Initial ultrasound scanning demonstrates a large amount of ascites within the left lower abdominal quadrant. The left lower abdomen was prepped and draped in the usual sterile fashion. 1% lidocaine was used for local anesthesia. Following this, a 19 gauge, 7-cm, Yueh catheter was introduced. An ultrasound image was saved for documentation purposes. The paracentesis was performed. The catheter was removed and a dressing was applied. The patient tolerated the procedure well without immediate post procedural complication. FINDINGS: A total of approximately 5.5 L of clear yellow fluid was removed. Ordering provider did not request laboratory samples. IMPRESSION: Successful ultrasound-guided therapeutic paracentesis yielding 5.5 liters of peritoneal fluid. Read by: Mina Marble, PA-C Electronically Signed   By: Roanna Banning M.D.   On: 11/24/2022 14:01   US Paracentesis  Result Date: 11/18/2022 INDICATION: Cholangiocarcinoma; recurrent ascites EXAM: ULTRASOUND GUIDED LLQ PARACENTESIS MEDICATIONS: 10 cc 1% lidocaine. COMPLICATIONS: None immediate. PROCEDURE: Informed written consent was obtained from the patient after a discussion of the risks, benefits and alternatives to treatment. A timeout was performed prior to the initiation of the procedure. Initial ultrasound scanning demonstrates a large amount of ascites within the left lower abdominal quadrant. The left lower abdomen was prepped and draped in the usual sterile fashion. 1% lidocaine was used for local anesthesia. Following this, a 19 gauge, 7-cm, Yueh catheter was introduced. An ultrasound image was saved for documentation purposes. The paracentesis was performed. The catheter was removed and a dressing was applied. The patient tolerated the procedure well without immediate post procedural complication.  Patient received post-procedure intravenous albumin; see nursing notes for details. FINDINGS: A total of approximately 5.5 liters of cloudy yellow fluid was removed. IMPRESSION: Successful ultrasound-guided paracentesis yielding 5.5 liters of peritoneal fluid. Read by Robet Leu PAC. Supervised and interpreted by Jeronimo Greaves, M.D. Electronically Signed   By: Jeronimo Greaves M.D.   On: 11/18/2022 14:54

## 2022-12-14 ENCOUNTER — Other Ambulatory Visit: Payer: Self-pay | Admitting: Hematology

## 2022-12-14 ENCOUNTER — Inpatient Hospital Stay (HOSPITAL_BASED_OUTPATIENT_CLINIC_OR_DEPARTMENT_OTHER): Payer: Medicare Other | Admitting: Hematology

## 2022-12-14 ENCOUNTER — Encounter: Payer: Self-pay | Admitting: Hematology

## 2022-12-14 ENCOUNTER — Inpatient Hospital Stay: Payer: Medicare Other | Admitting: Dietician

## 2022-12-14 ENCOUNTER — Inpatient Hospital Stay: Payer: Medicare Other

## 2022-12-14 ENCOUNTER — Inpatient Hospital Stay: Payer: Medicare Other | Attending: Hematology

## 2022-12-14 VITALS — BP 89/58 | HR 101 | Temp 97.7°F | Resp 16 | Wt 147.2 lb

## 2022-12-14 DIAGNOSIS — I959 Hypotension, unspecified: Secondary | ICD-10-CM | POA: Diagnosis not present

## 2022-12-14 DIAGNOSIS — K831 Obstruction of bile duct: Secondary | ICD-10-CM | POA: Diagnosis not present

## 2022-12-14 DIAGNOSIS — R140 Abdominal distension (gaseous): Secondary | ICD-10-CM | POA: Diagnosis not present

## 2022-12-14 DIAGNOSIS — T85858A Stenosis due to other internal prosthetic devices, implants and grafts, initial encounter: Secondary | ICD-10-CM | POA: Diagnosis not present

## 2022-12-14 DIAGNOSIS — Z95828 Presence of other vascular implants and grafts: Secondary | ICD-10-CM

## 2022-12-14 DIAGNOSIS — G8929 Other chronic pain: Secondary | ICD-10-CM | POA: Insufficient documentation

## 2022-12-14 DIAGNOSIS — I2510 Atherosclerotic heart disease of native coronary artery without angina pectoris: Secondary | ICD-10-CM | POA: Diagnosis not present

## 2022-12-14 DIAGNOSIS — E1122 Type 2 diabetes mellitus with diabetic chronic kidney disease: Secondary | ICD-10-CM | POA: Insufficient documentation

## 2022-12-14 DIAGNOSIS — Z5986 Financial insecurity: Secondary | ICD-10-CM | POA: Insufficient documentation

## 2022-12-14 DIAGNOSIS — E785 Hyperlipidemia, unspecified: Secondary | ICD-10-CM | POA: Diagnosis not present

## 2022-12-14 DIAGNOSIS — C221 Intrahepatic bile duct carcinoma: Secondary | ICD-10-CM | POA: Diagnosis not present

## 2022-12-14 DIAGNOSIS — R188 Other ascites: Secondary | ICD-10-CM | POA: Insufficient documentation

## 2022-12-14 DIAGNOSIS — Z86718 Personal history of other venous thrombosis and embolism: Secondary | ICD-10-CM | POA: Diagnosis not present

## 2022-12-14 DIAGNOSIS — Z79899 Other long term (current) drug therapy: Secondary | ICD-10-CM | POA: Insufficient documentation

## 2022-12-14 DIAGNOSIS — N1832 Chronic kidney disease, stage 3b: Secondary | ICD-10-CM | POA: Diagnosis not present

## 2022-12-14 DIAGNOSIS — F17210 Nicotine dependence, cigarettes, uncomplicated: Secondary | ICD-10-CM | POA: Diagnosis not present

## 2022-12-14 LAB — COMPREHENSIVE METABOLIC PANEL
ALT: 67 U/L — ABNORMAL HIGH (ref 0–44)
AST: 86 U/L — ABNORMAL HIGH (ref 15–41)
Albumin: 2.4 g/dL — ABNORMAL LOW (ref 3.5–5.0)
Alkaline Phosphatase: 400 U/L — ABNORMAL HIGH (ref 38–126)
Anion gap: 6 (ref 5–15)
BUN: 42 mg/dL — ABNORMAL HIGH (ref 8–23)
CO2: 13 mmol/L — ABNORMAL LOW (ref 22–32)
Calcium: 8.6 mg/dL — ABNORMAL LOW (ref 8.9–10.3)
Chloride: 107 mmol/L (ref 98–111)
Creatinine, Ser: 2.21 mg/dL — ABNORMAL HIGH (ref 0.61–1.24)
GFR, Estimated: 32 mL/min — ABNORMAL LOW (ref >60)
Glucose, Bld: 167 mg/dL — ABNORMAL HIGH (ref 70–99)
Potassium: 5.7 mmol/L — ABNORMAL HIGH (ref 3.5–5.1)
Sodium: 126 mmol/L — ABNORMAL LOW (ref 135–145)
Total Bilirubin: 2.5 mg/dL — ABNORMAL HIGH (ref 0.3–1.2)
Total Protein: 5.6 g/dL — ABNORMAL LOW (ref 6.5–8.1)

## 2022-12-14 LAB — CBC WITH DIFFERENTIAL/PLATELET
Abs Immature Granulocytes: 0.01 10*3/uL (ref 0.00–0.07)
Basophils Absolute: 0 10*3/uL (ref 0.0–0.1)
Basophils Relative: 1 %
Eosinophils Absolute: 0.1 10*3/uL (ref 0.0–0.5)
Eosinophils Relative: 2 %
HCT: 26.4 % — ABNORMAL LOW (ref 39.0–52.0)
Hemoglobin: 8.5 g/dL — ABNORMAL LOW (ref 13.0–17.0)
Immature Granulocytes: 0 %
Lymphocytes Relative: 13 %
Lymphs Abs: 0.8 10*3/uL (ref 0.7–4.0)
MCH: 33.3 pg (ref 26.0–34.0)
MCHC: 32.2 g/dL (ref 30.0–36.0)
MCV: 103.5 fL — ABNORMAL HIGH (ref 80.0–100.0)
Monocytes Absolute: 0.6 10*3/uL (ref 0.1–1.0)
Monocytes Relative: 9 %
Neutro Abs: 4.7 10*3/uL (ref 1.7–7.7)
Neutrophils Relative %: 75 %
Platelets: 87 10*3/uL — ABNORMAL LOW (ref 150–400)
RBC: 2.55 MIL/uL — ABNORMAL LOW (ref 4.22–5.81)
RDW: 15.4 % (ref 11.5–15.5)
WBC: 6.2 10*3/uL (ref 4.0–10.5)
nRBC: 0 % (ref 0.0–0.2)

## 2022-12-14 LAB — MAGNESIUM: Magnesium: 1.9 mg/dL (ref 1.7–2.4)

## 2022-12-14 MED ORDER — GLIPIZIDE 5 MG PO TABS: 5.0000 mg | ORAL_TABLET | Freq: Every day | ORAL | 3 refills | Status: DC

## 2022-12-14 MED ORDER — HEPARIN SOD (PORK) LOCK FLUSH 100 UNIT/ML IV SOLN
250.0000 [IU] | Freq: Once | INTRAVENOUS | Status: AC
  Administered 2022-12-14: 250 [IU] via INTRAVENOUS

## 2022-12-14 MED ORDER — SODIUM POLYSTYRENE SULFONATE 15 GM/60ML PO SUSP
60.0000 g | Freq: Once | ORAL | Status: DC
  Administered 2022-12-14: 60 g via RECTAL
  Filled 2022-12-14: qty 240

## 2022-12-14 NOTE — Progress Notes (Signed)
OFF PATHWAY REGIMEN - Other  No Change  Continue With Treatment as Ordered.  Original Decision Date/Time: 07/17/2021 16:12   [id number]:Cisplatin IV D1,8 + Durvalumab 1,500 mg IV D1 + Gemcitabine IV D1,8 q21 Days for up to 8 Cycles Followed by Durvalumab 1,500 mg IV D1 q28 Days:   Cycles 1 through up to 8: A cycle is every 21 days:     Durvalumab      Gemcitabine      Cisplatin    Cycles 9 and beyond: A cycle is every 28 days:     Durvalumab   **Always confirm dose/schedule in your pharmacy ordering system**  Patient Characteristics: Intent of Therapy: Non-Curative / Palliative Intent, Discussed with Patient

## 2022-12-14 NOTE — Patient Instructions (Addendum)
[facility] at [facility] Discharge Instructions   You were seen and examined today by Dr. Ellin Saba.  He reviewed the results of your lab work. Your liver numbers remain high, but are improved. Your bilirubin is 2.5. Your potassium is elevated at 5.7. Your creatinine (kidney number) is elevated at 2.21 today.   We will change your treatment to a regimen called FOLFOX. This consists of 2 chemotherapy drugs - oxaliplatin and fluorouracil (5FU). This is given in the clinic every 2 weeks. This regimen also requires you wear and ambulatory pump for 2 days, then you come back to the clinic to have the pump taken off. You were given literature on these medications.   Return as scheduled.    Thank you for choosing [facility] at [facility] to provide your oncology and hematology care.  To afford each patient quality time with our provider, please arrive at least 15 minutes before your scheduled appointment time.   If you have a lab appointment with the Cancer Center please come in thru the Main Entrance and check in at the main information desk.  You need to re-schedule your appointment should you arrive 10 or more minutes late.  We strive to give you quality time with our providers, and arriving late affects you and other patients whose appointments are after yours.  Also, if you no show three or more times for appointments you may be dismissed from the clinic at the providers discretion.     Again, thank you for choosing [facility].  Our hope is that these requests will decrease the amount of time that you wait before being seen by our physicians.       _____________________________________________________________  Should you have questions after your visit to [facility], please contact our office at [phone] and follow the prompts.  Our office hours are 8:00 a.m. and 4:30 p.m. Monday - Friday.  Please note that  voicemails left after 4:00 p.m. may not be returned until the following business day.  We are closed weekends and major holidays.  You do have access to a nurse 24-7, just call the main number to the clinic [phone] and do not press any options, hold on the line and a nurse will answer the phone.    For prescription refill requests, have your pharmacy contact our office and allow 72 hours.    Due to Covid, you will need to wear a mask upon entering the hospital. If you do not have a mask, a mask will be given to you at the Main Entrance upon arrival. For doctor visits, patients may have 1 support person age 5 or older with them. For treatment visits, patients can not have anyone with them due to social distancing guidelines and our immunocompromised population.

## 2022-12-14 NOTE — Progress Notes (Signed)
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 S/p cystoscopy with Foley placement under the care of Dr. Ronne Binning (4/11). He is currently receiving cisplatin + gemcitabine + imfinzi q28d.   Noted recent hospitalizations: 3/30-4/8 - sepsis secondary to biliary obstruction; enterococcal bacteremia 4/22-4/24 - abnormal labs   Patient did not receive treatment today secondary to labs. Spoke with RN regarding patient. RN reports abdominal appeared distended today. Last paracentesis on 5/1. Per wife, patient has been doing better and eating a bit more. Noted treatment plan changes. Plans to start Folfox on 5/13.   Medications: glipizide  Labs: Na 126, K 5.7 , glucose, BUN 42, Cr 2.21, albumin 2.4  Anthropometrics: Wt 147 lb 3.2 oz today (suspect fluid status masking actual wt - noted multiple paracentesis)  4/22 - 152 lb 3.2 oz  4/1 - 167 lb 1.7 oz 3/16 - 163 lb 9.3 oz    Paracentesis: 4/16 - 5.5 L 4/24 - 4 L 5/1 - 6 L   NUTRITION DIAGNOSIS: Unintended weight loss continues    INTERVENTION:  Continue drinking 2-3 CIB     MONITORING, EVALUATION, GOAL: weight trends, intake    NEXT VISIT: Monday May 13 during infusion

## 2022-12-14 NOTE — Progress Notes (Signed)
DISCONTINUE OFF PATHWAY REGIMEN - Other   [id number]:Cisplatin IV D1,8 + Durvalumab 1,500 mg IV D1 + Gemcitabine IV D1,8 q21 Days for up to 8 Cycles Followed by Durvalumab 1,500 mg IV D1 q28 Days:   Cycles 1 through up to 8: A cycle is every 21 days:     Durvalumab      Gemcitabine      Cisplatin    Cycles 9 and beyond: A cycle is every 28 days:     Durvalumab   **Always confirm dose/schedule in your pharmacy ordering system**  REASON: Toxicities / Adverse Event PRIOR TREATMENT: Cisplatin IV D1,8 + Durvalumab 1,500 mg IV D1 + Gemcitabine IV D1,8 q21 Days for up to 8 Cycles Followed by Durvalumab 1,500 mg IV D1 q28 Days TREATMENT RESPONSE: Stable Disease (SD)  START OFF PATHWAY REGIMEN - Other   [id number]:mFOLFOX6 (Leucovorin IV D1 + Fluorouracil IV D1/CIV D1,2 + Oxaliplatin IV D1) q14 Days:   A cycle is every 14 days:     Oxaliplatin      Leucovorin      Fluorouracil      Fluorouracil   **Always confirm dose/schedule in your pharmacy ordering system**  Patient Characteristics: Intent of Therapy: Non-Curative / Palliative Intent, Discussed with Patient

## 2022-12-14 NOTE — Progress Notes (Signed)
No treatment today per MD. Patient will be changing the type of treatment he is getting per MD.   Picc line dressing changed today per protocol. [name] presented for PICC line flush. Proper placement of PICC confirmed by CXR. PICC line located right arm.  . Good blood return present. PICC line flushed with 20ml NS and 300U/47ml Heparin. Procedure without incident. Patient tolerated procedure well.   Vitals stable and discharged home from clinic via wheelchair.  Follow up as scheduled.

## 2022-12-15 ENCOUNTER — Other Ambulatory Visit: Payer: Self-pay | Admitting: Student

## 2022-12-15 ENCOUNTER — Telehealth: Payer: Self-pay | Admitting: *Deleted

## 2022-12-15 DIAGNOSIS — E1165 Type 2 diabetes mellitus with hyperglycemia: Secondary | ICD-10-CM

## 2022-12-15 NOTE — Telephone Encounter (Signed)
Patient's wife called stating that patient had chills last evening, however was given kayexalate to normalize potassium.  Feeling weak but somewhat better today.  Denies fever. Advised to give [place] a call back this afternoon if he does not improve.  Verbalized understanding.

## 2022-12-16 ENCOUNTER — Other Ambulatory Visit: Payer: Self-pay | Admitting: Hematology

## 2022-12-16 ENCOUNTER — Ambulatory Visit (HOSPITAL_COMMUNITY): Payer: Medicare Other

## 2022-12-16 ENCOUNTER — Encounter: Payer: Self-pay | Admitting: Hematology

## 2022-12-16 ENCOUNTER — Ambulatory Visit (HOSPITAL_COMMUNITY): Admission: RE | Admit: 2022-12-16 | Payer: Medicare Other | Source: Ambulatory Visit

## 2022-12-16 ENCOUNTER — Telehealth: Payer: Self-pay

## 2022-12-16 DIAGNOSIS — R180 Malignant ascites: Secondary | ICD-10-CM

## 2022-12-16 DIAGNOSIS — C221 Intrahepatic bile duct carcinoma: Secondary | ICD-10-CM

## 2022-12-16 NOTE — Telephone Encounter (Signed)
Patient's wife called advising that she had concerns with patient not urinating as frequently as he normally does. She requested an appt with Dr. Ronne Binning and I advised the next available would be in June. I advised her that he could drop off a urine specimen to ensure there is not an infection. She requested to speak to a clinical member prior to scheduling any appt.     Thank you

## 2022-12-16 NOTE — Telephone Encounter (Signed)
Patient scheduled for NV for pvr and ua/uc-  complaints of abdominal pain and trouble voiding.

## 2022-12-17 ENCOUNTER — Telehealth: Payer: Self-pay

## 2022-12-17 ENCOUNTER — Ambulatory Visit (INDEPENDENT_AMBULATORY_CARE_PROVIDER_SITE_OTHER): Payer: Medicare Other

## 2022-12-17 ENCOUNTER — Ambulatory Visit (HOSPITAL_COMMUNITY)
Admission: RE | Admit: 2022-12-17 | Discharge: 2022-12-17 | Disposition: A | Discharge status: Home / Self Care | Payer: Medicare Other | Source: Ambulatory Visit | Attending: Hematology | Admitting: Hematology

## 2022-12-17 ENCOUNTER — Encounter (HOSPITAL_COMMUNITY): Payer: Self-pay

## 2022-12-17 DIAGNOSIS — C221 Intrahepatic bile duct carcinoma: Secondary | ICD-10-CM

## 2022-12-17 DIAGNOSIS — R339 Retention of urine, unspecified: Secondary | ICD-10-CM | POA: Diagnosis not present

## 2022-12-17 DIAGNOSIS — R180 Malignant ascites: Secondary | ICD-10-CM

## 2022-12-17 LAB — URINALYSIS, ROUTINE W REFLEX MICROSCOPIC
Bilirubin, UA: NEGATIVE
Glucose, UA: NEGATIVE
Ketones, UA: NEGATIVE
Leukocytes,UA: NEGATIVE
Nitrite, UA: NEGATIVE
RBC, UA: NEGATIVE
Specific Gravity, UA: 1.025 (ref 1.005–1.030)
Urobilinogen, Ur: 0.2 mg/dL (ref 0.2–1.0)
pH, UA: 5 (ref 5.0–7.5)

## 2022-12-17 LAB — MICROSCOPIC EXAMINATION
Bacteria, UA: NONE SEEN
RBC, Urine: NONE SEEN /hpf (ref 0–2)

## 2022-12-17 LAB — BLADDER SCAN AMB NON-IMAGING: Scan Result: 1724

## 2022-12-17 NOTE — Procedures (Signed)
PROCEDURE SUMMARY:  Successful image-guided paracentesis from the right lower abdomen.  Yielded 4 liters of cloudy yellow fluid.  No immediate complications.  EBL = trace. Patient tolerated well.   Specimen was not sent for labs.  Please see imaging section of Epic for full dictation.   Kennieth Francois PA-C 12/17/2022 11:59 AM

## 2022-12-17 NOTE — Telephone Encounter (Signed)
Patient's stomach was drained to the amount of over 4 liters. Patient feels like he isn't emptying bladder completely. Will he need to leave the catheter in until the beginning of next week.  Please advise wife, Liborio Nixon [phone]  Thank you.

## 2022-12-17 NOTE — Telephone Encounter (Signed)
Return call to patient's wife. Wife is aware to come to office to remove catheter and voiced understanding.

## 2022-12-17 NOTE — Progress Notes (Cosign Needed Addendum)
Patient presents today with complaints of  troubling voiding and abdominal pain.  UA and Culture done today.  Dr. Annabell Howells reviewed results and no treatment needed at this time.  Patient aware of MD recommendations and that we will reach out with culture results.       Pt here today for bladder scan. Bladder was scanned and 1724 was visualized.   Simple Catheter Placement  Due to urinary retention patient is present today for a foley cath placement.  Patient was cleaned and prepped in a sterile fashion with betadine and 2% lidocaine jelly was instilled into the urethra. A 16 FR foley catheter was inserted, urine return was noted  0 ml, urine was 0 in color.  The balloon was filled with 10cc of sterile water.  A night bag was attached for drainage. Patient was also given a night bag to take home and was given instruction on how to change from one bag to another.  Patient was given instruction on proper catheter care.  Patient tolerated well, no complications were noted   Performed by: Kennyth Lose, CMA  Additional notes/ Follow up: No follow-ups on file.    Catheter Removal  Patient is present today for a catheter removal.  10 ml of water was drained from the balloon. A 16 FR foley cath was removed from the bladder, no complications were noted. Patient tolerated well.  Performed by: Kennyth Lose, CMA  Follow up/ Additional notes: No follow-ups on file.

## 2022-12-17 NOTE — Progress Notes (Signed)
Patient tolerated right sided paracentesis procedure well today and 4 Liters of cloudy yellow ascites removed. Patient verbalized understanding of post procedure instructions and left via wheelchair with family with no acute distress noted.

## 2022-12-19 LAB — URINE CULTURE: Organism ID, Bacteria: NO GROWTH

## 2022-12-21 ENCOUNTER — Inpatient Hospital Stay: Payer: Medicare Other | Admitting: Dietician

## 2022-12-21 ENCOUNTER — Inpatient Hospital Stay: Payer: Medicare Other

## 2022-12-21 VITALS — BP 95/65 | HR 82 | Temp 97.1°F | Resp 18 | Wt 146.6 lb

## 2022-12-21 DIAGNOSIS — C221 Intrahepatic bile duct carcinoma: Secondary | ICD-10-CM

## 2022-12-21 DIAGNOSIS — Z95828 Presence of other vascular implants and grafts: Secondary | ICD-10-CM

## 2022-12-21 DIAGNOSIS — E875 Hyperkalemia: Secondary | ICD-10-CM

## 2022-12-21 LAB — CBC WITH DIFFERENTIAL/PLATELET
Abs Immature Granulocytes: 0.03 10*3/uL (ref 0.00–0.07)
Basophils Absolute: 0 10*3/uL (ref 0.0–0.1)
Basophils Relative: 0 %
Eosinophils Absolute: 0.1 10*3/uL (ref 0.0–0.5)
Eosinophils Relative: 2 %
HCT: 28.4 % — ABNORMAL LOW (ref 39.0–52.0)
Hemoglobin: 9.4 g/dL — ABNORMAL LOW (ref 13.0–17.0)
Immature Granulocytes: 0 %
Lymphocytes Relative: 13 %
Lymphs Abs: 0.9 10*3/uL (ref 0.7–4.0)
MCH: 34.1 pg — ABNORMAL HIGH (ref 26.0–34.0)
MCHC: 33.1 g/dL (ref 30.0–36.0)
MCV: 102.9 fL — ABNORMAL HIGH (ref 80.0–100.0)
Monocytes Absolute: 0.8 10*3/uL (ref 0.1–1.0)
Monocytes Relative: 12 %
Neutro Abs: 4.9 10*3/uL (ref 1.7–7.7)
Neutrophils Relative %: 73 %
Platelets: 114 10*3/uL — ABNORMAL LOW (ref 150–400)
RBC: 2.76 MIL/uL — ABNORMAL LOW (ref 4.22–5.81)
RDW: 14.7 % (ref 11.5–15.5)
WBC: 6.7 10*3/uL (ref 4.0–10.5)
nRBC: 0 % (ref 0.0–0.2)

## 2022-12-21 LAB — COMPREHENSIVE METABOLIC PANEL
ALT: 86 U/L — ABNORMAL HIGH (ref 0–44)
AST: 98 U/L — ABNORMAL HIGH (ref 15–41)
Albumin: 2.2 g/dL — ABNORMAL LOW (ref 3.5–5.0)
Alkaline Phosphatase: 461 U/L — ABNORMAL HIGH (ref 38–126)
Anion gap: 6 (ref 5–15)
BUN: 58 mg/dL — ABNORMAL HIGH (ref 8–23)
CO2: 14 mmol/L — ABNORMAL LOW (ref 22–32)
Calcium: 8.3 mg/dL — ABNORMAL LOW (ref 8.9–10.3)
Chloride: 101 mmol/L (ref 98–111)
Creatinine, Ser: 2.59 mg/dL — ABNORMAL HIGH (ref 0.61–1.24)
GFR, Estimated: 26 mL/min — ABNORMAL LOW (ref >60)
Glucose, Bld: 169 mg/dL — ABNORMAL HIGH (ref 70–99)
Potassium: 5.7 mmol/L — ABNORMAL HIGH (ref 3.5–5.1)
Sodium: 121 mmol/L — ABNORMAL LOW (ref 135–145)
Total Bilirubin: 2.3 mg/dL — ABNORMAL HIGH (ref 0.3–1.2)
Total Protein: 5.4 g/dL — ABNORMAL LOW (ref 6.5–8.1)

## 2022-12-21 LAB — MAGNESIUM: Magnesium: 1.9 mg/dL (ref 1.7–2.4)

## 2022-12-21 MED ORDER — PALONOSETRON HCL INJECTION 0.25 MG/5ML
0.2500 mg | Freq: Once | INTRAVENOUS | Status: AC
  Administered 2022-12-21: 0.25 mg via INTRAVENOUS
  Filled 2022-12-21: qty 5

## 2022-12-21 MED ORDER — SODIUM CHLORIDE 0.9% FLUSH: 10.0000 mL | INTRAVENOUS | Status: DC | PRN

## 2022-12-21 MED ORDER — INSULIN REGULAR HUMAN 100 UNIT/ML IJ SOLN
10.0000 [IU] | Freq: Once | INTRAMUSCULAR | Status: AC
  Administered 2022-12-21: 10 [IU] via INTRAVENOUS
  Filled 2022-12-21: qty 0.1

## 2022-12-21 MED ORDER — DEXTROSE 5 % IV SOLN: Freq: Once | INTRAVENOUS | Status: AC

## 2022-12-21 MED ORDER — SODIUM CHLORIDE 0.9 % IV SOLN: INTRAVENOUS | Status: AC

## 2022-12-21 MED ORDER — FLUOROURACIL CHEMO INJECTION 500 MG/10ML
266.6667 mg/m2 | Freq: Once | INTRAVENOUS | Status: AC
  Administered 2022-12-21: 500 mg via INTRAVENOUS
  Filled 2022-12-21: qty 10

## 2022-12-21 MED ORDER — SODIUM CHLORIDE 0.9 % IV SOLN: Freq: Once | INTRAVENOUS | Status: AC

## 2022-12-21 MED ORDER — HEPARIN SOD (PORK) LOCK FLUSH 100 UNIT/ML IV SOLN: 250.0000 [IU] | Freq: Once | INTRAVENOUS | Status: DC | PRN

## 2022-12-21 MED ORDER — LEUCOVORIN CALCIUM INJECTION 350 MG
266.6667 mg/m2 | Freq: Once | INTRAVENOUS | Status: AC
  Administered 2022-12-21: 488 mg via INTRAVENOUS
  Filled 2022-12-21: qty 24.4

## 2022-12-21 MED ORDER — SODIUM CHLORIDE 0.9 % IV SOLN
1600.0000 mg/m2 | INTRAVENOUS | Status: DC
  Administered 2022-12-21: 2950 mg via INTRAVENOUS
  Filled 2022-12-21: qty 59

## 2022-12-21 MED ORDER — OXALIPLATIN CHEMO INJECTION 100 MG/20ML
56.6667 mg/m2 | Freq: Once | INTRAVENOUS | Status: AC
  Administered 2022-12-21: 100 mg via INTRAVENOUS
  Filled 2022-12-21: qty 20

## 2022-12-21 MED ORDER — DEXTROSE 50 % IV SOLN
1.0000 | Freq: Once | INTRAVENOUS | Status: AC
  Administered 2022-12-21: 50 mL via INTRAVENOUS
  Filled 2022-12-21: qty 50

## 2022-12-21 MED ORDER — SODIUM CHLORIDE 0.9% FLUSH
10.0000 mL | INTRAVENOUS | Status: DC | PRN
  Administered 2022-12-21: 10 mL via INTRAVENOUS

## 2022-12-21 MED ORDER — SODIUM CHLORIDE 0.9 % IV SOLN
10.0000 mg | Freq: Once | INTRAVENOUS | Status: AC
  Administered 2022-12-21: 10 mg via INTRAVENOUS
  Filled 2022-12-21: qty 10

## 2022-12-21 NOTE — Progress Notes (Signed)
Patient PICC line flushed well with good blood return noted.  Labs collected.  PICC line dressing change done today.  Dressing intact and antimicrobial disc in place.

## 2022-12-21 NOTE — Patient Instructions (Signed)
[facility]  Discharge Instructions: Thank you for choosing [facility] to provide your oncology and hematology care.  If you have a lab appointment with the Cancer Center - please note that after April 8th, 2024, all labs will be drawn in the cancer center.  You do not have to check in or register with the main entrance as you have in the past but will complete your check-in in the cancer center.  Wear comfortable clothing and clothing appropriate for easy access to any Portacath or PICC line.   We strive to give you quality time with your provider. You may need to reschedule your appointment if you arrive late (15 or more minutes).  Arriving late affects you and other patients whose appointments are after yours.  Also, if you miss three or more appointments without notifying the office, you may be dismissed from the clinic at the provider's discretion.      For prescription refill requests, have your pharmacy contact our office and allow 72 hours for refills to be completed.    Today you received the following chemotherapy and/or immunotherapy agents FOLFOX with 5FU pump start      To help prevent nausea and vomiting after your treatment, we encourage you to take your nausea medication as directed.  BELOW ARE SYMPTOMS THAT SHOULD BE REPORTED IMMEDIATELY: *FEVER GREATER THAN 100.4 F (38 C) OR HIGHER *CHILLS OR SWEATING *NAUSEA AND VOMITING THAT IS NOT CONTROLLED WITH YOUR NAUSEA MEDICATION *UNUSUAL SHORTNESS OF BREATH *UNUSUAL BRUISING OR BLEEDING *URINARY PROBLEMS (pain or burning when urinating, or frequent urination) *BOWEL PROBLEMS (unusual diarrhea, constipation, pain near the anus) TENDERNESS IN MOUTH AND THROAT WITH OR WITHOUT PRESENCE OF ULCERS (sore throat, sores in mouth, or a toothache) UNUSUAL RASH, SWELLING OR PAIN  UNUSUAL VAGINAL DISCHARGE OR ITCHING   Items with * indicate a potential emergency and should be followed up as soon as  possible or go to the Emergency Department if any problems should occur.  Please show the CHEMOTHERAPY ALERT CARD or IMMUNOTHERAPY ALERT CARD at check-in to the Emergency Department and triage nurse.  Should you have questions after your visit or need to cancel or reschedule your appointment, please contact [facility] CENTER AT [facility] [phone]  and follow the prompts.  Office hours are 8:00 a.m. to 4:30 p.m. Monday - Friday. Please note that voicemails left after 4:00 p.m. may not be returned until the following business day.  We are closed weekends and major holidays. You have access to a nurse at all times for urgent questions. Please call the main number to the clinic [phone] and follow the prompts.  For any non-urgent questions, you may also contact your provider using MyChart. We now offer e-Visits for anyone 65 and older to request care online for non-urgent symptoms. For details visit [email or web address].   Also download the MyChart app! Go to the app store, search "MyChart", open the app, select [place], and log in with your MyChart username and password.

## 2022-12-21 NOTE — Progress Notes (Signed)
Patient presents today for D1C1 FOLFOX with 5FU pump start per providers order.  Patient is weak and dizzy can only walk a few steps. His BP is 80/56 and it has been taken several times and is the same. Total Bili 2.3, ALT 86, AST 98, Creatinine 2.59, Na 121, Potassium 5.7. MD notified.  Message received from [place] okay to proceed with treatment.  Treatment given today per MD orders.  Stable during infusion without adverse affects.  Vital signs stable.  Verified RUN on the screen with the patient.  No complaints at this time.  Discharge from clinic via wheelchair  in stable condition.  Alert and oriented X 3.  Follow up with [facility] as scheduled.

## 2022-12-21 NOTE — Progress Notes (Signed)
Pharmacist Chemotherapy Monitoring - Initial Assessment    Anticipated start date: 12/21/22   The following has been reviewed per standard work regarding the patient's treatment regimen: The patient's diagnosis, treatment plan and drug doses, and organ/hematologic function Lab orders and baseline tests specific to treatment regimen  The treatment plan start date, drug sequencing, and pre-medications Prior authorization status  Patient's documented medication list, including drug-drug interaction screen and prescriptions for anti-emetics and supportive care specific to the treatment regimen The drug concentrations, fluid compatibility, administration routes, and timing of the medications to be used The patient's access for treatment and lifetime cumulative dose history, if applicable  The patient's medication allergies and previous infusion related reactions, if applicable   Changes made to treatment plan:  treatment plan date - held so had to move  Follow up needed:  N/A   Stephens Shire, [facility], 12/21/2022  11:25 AM

## 2022-12-21 NOTE — Progress Notes (Signed)
Dr Ellin Saba aware of ECHO 40-45% and low BP readings.  Proceed with treatment.  T.O. Dr Carilyn Goodpasture, PharmD

## 2022-12-21 NOTE — Addendum Note (Signed)
Addended by: Doreatha Massed on: 12/21/2022 11:17 AM   Modules accepted: Orders

## 2022-12-21 NOTE — Progress Notes (Signed)
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 S/p cystoscopy with Foley placement under the care of Dr. Ronne Binning (4/11). He is currently receiving FOLFOX q14d (first 5/13).  Noted recent hospitalizations: 3/30-4/8 - sepsis secondary to biliary obstruction; enterococcal bacteremia 4/22-4/24 - abnormal labs   Met with patient and wife in infusion. Patient ill appearing. He acknowledges presence, but does not interact with RD today. All history provided from wife. Wife reports poor appetite. She does not know what foods pt will tolerate from day to day. He did have cereal with Ensure instead of milk for breakfast. Wife reports he has early satiety secondary to ascites. He eats a few bites at a time. Patient is drinking CIB occasionally. Wife is asking for additional orders for paracentesis as pt has one left.   Paracentesis: 4/16 - 5.5 L 4/24 - 4 L 5/1 - 6 L 5/9 - 4 L   Medications: reviewed   Labs: Na 121, glucose 169, albumin 2.2, K 5.7, BUN 58, Cr 2.59  Anthropometrics: Wt 146 lb 9.6 oz today  5/6 - 147 lb 3.2 oz   NUTRITION DIAGNOSIS: Unintended weight loss - suspect ongoing, however actual weight masked by fluid    INTERVENTION:  Encouraged CIB 3x/day Encouraged high calorie high protein foods, soft moist textures for ease of intake Educated on cold sensitivity side effects of new treatment, offered warm supplement ideas - handout provided    MONITORING, EVALUATION, GOAL: weight trends, intake, fluid status   NEXT VISIT: Thursday May 30 during infusion

## 2022-12-22 ENCOUNTER — Telehealth: Payer: Self-pay

## 2022-12-22 NOTE — Telephone Encounter (Signed)
Letter sent out with results.

## 2022-12-22 NOTE — Telephone Encounter (Signed)
-----   Message from Marcine Matar, MD sent at 12/21/2022  7:42 PM EDT ----- I dont know anything about this pt but you can let them know urine culture was neg ----- Message ----- From: Troy Sine, CMA Sent: 12/21/2022   4:01 PM EDT To: Marcine Matar, MD  Please review

## 2022-12-23 ENCOUNTER — Inpatient Hospital Stay: Payer: Medicare Other

## 2022-12-23 ENCOUNTER — Encounter (HOSPITAL_COMMUNITY): Payer: Self-pay

## 2022-12-23 ENCOUNTER — Other Ambulatory Visit: Payer: Self-pay | Admitting: Hematology

## 2022-12-23 ENCOUNTER — Ambulatory Visit (HOSPITAL_COMMUNITY)
Admission: RE | Admit: 2022-12-23 | Discharge: 2022-12-23 | Disposition: A | Discharge status: Home / Self Care | Payer: Medicare Other | Source: Ambulatory Visit | Attending: Hematology | Admitting: Hematology

## 2022-12-23 ENCOUNTER — Other Ambulatory Visit: Payer: Self-pay | Admitting: Radiology

## 2022-12-23 VITALS — BP 85/46 | HR 92 | Temp 96.2°F | Resp 18

## 2022-12-23 DIAGNOSIS — R180 Malignant ascites: Secondary | ICD-10-CM | POA: Insufficient documentation

## 2022-12-23 DIAGNOSIS — C221 Intrahepatic bile duct carcinoma: Secondary | ICD-10-CM | POA: Diagnosis present

## 2022-12-23 DIAGNOSIS — Z452 Encounter for adjustment and management of vascular access device: Secondary | ICD-10-CM

## 2022-12-23 DIAGNOSIS — Z95828 Presence of other vascular implants and grafts: Secondary | ICD-10-CM

## 2022-12-23 MED ORDER — ALBUMIN HUMAN 25 % IV SOLN
50.0000 g | INTRAVENOUS | Status: DC
  Administered 2022-12-23: 50 g via INTRAVENOUS

## 2022-12-23 MED ORDER — HEPARIN SOD (PORK) LOCK FLUSH 100 UNIT/ML IV SOLN
500.0000 [IU] | Freq: Once | INTRAVENOUS | Status: AC
  Administered 2022-12-23: 500 [IU] via INTRAVENOUS

## 2022-12-23 MED ORDER — ALBUMIN HUMAN 25 % IV SOLN
INTRAVENOUS | Status: AC
  Filled 2022-12-23: qty 200

## 2022-12-23 MED ORDER — SODIUM CHLORIDE 0.9% FLUSH
10.0000 mL | INTRAVENOUS | Status: DC | PRN
  Administered 2022-12-23: 10 mL via INTRAVENOUS

## 2022-12-23 NOTE — Progress Notes (Signed)
Patient arrived for 5FU pump D/C. Patient brought by family via wheelchair. Patient had paracentesis prior to arriving. Patient's blood pressure 85/46 and patient does report dizziness. Dr Ellin Saba made aware and assessed patient. No further orders given by Dr Ellin Saba at this time. Patient 5FU pump removed successfully and PICC line flushed. Patient tolerated well. Patient left via wheelchair with family.

## 2022-12-23 NOTE — Progress Notes (Signed)
Patient tolerated left sided paracentesis procedure and 50G of IV albumin well today. 6 Liters of cloudy/milky yellow ascites removed. Patient verbalized understanding of discharge instructions and left with family via wheelchair with no acute distress noted.

## 2022-12-23 NOTE — Procedures (Signed)
PROCEDURE SUMMARY:  Successful image-guided paracentesis from the left lower abdomen.  Yielded 6.0 liters of cloudy light yellow fluid.  No immediate complications.  EBL < 1 mL Patient tolerated well.   Specimen was not sent for labs.  Please see imaging section of Epic for full dictation.  Villa Herb PA-C 12/23/2022 11:31 AM

## 2022-12-23 NOTE — Patient Instructions (Signed)
[facility] [place]  Discharge Instructions: Thank you for choosing [facility] to provide your oncology and hematology care.  If you have a lab appointment with the Cancer Center - please note that after April 8th, 2024, all labs will be drawn in the cancer center.  You do not have to check in or register with the main entrance as you have in the past but will complete your check-in in the cancer center.  Wear comfortable clothing and clothing appropriate for easy access to any Portacath or PICC line.   We strive to give you quality time with your provider. You may need to reschedule your appointment if you arrive late (15 or more minutes).  Arriving late affects you and other patients whose appointments are after yours.  Also, if you miss three or more appointments without notifying the office, you may be dismissed from the clinic at the provider's discretion.      For prescription refill requests, have your pharmacy contact our office and allow 72 hours for refills to be completed.    Today you received the following 5FU pump D/C.    To help prevent nausea and vomiting after your treatment, we encourage you to take your nausea medication as directed.  BELOW ARE SYMPTOMS THAT SHOULD BE REPORTED IMMEDIATELY: *FEVER GREATER THAN 100.4 F (38 C) OR HIGHER *CHILLS OR SWEATING *NAUSEA AND VOMITING THAT IS NOT CONTROLLED WITH YOUR NAUSEA MEDICATION *UNUSUAL SHORTNESS OF BREATH *UNUSUAL BRUISING OR BLEEDING *URINARY PROBLEMS (pain or burning when urinating, or frequent urination) *BOWEL PROBLEMS (unusual diarrhea, constipation, pain near the anus) TENDERNESS IN MOUTH AND THROAT WITH OR WITHOUT PRESENCE OF ULCERS (sore throat, sores in mouth, or a toothache) UNUSUAL RASH, SWELLING OR PAIN  UNUSUAL VAGINAL DISCHARGE OR ITCHING   Items with * indicate a potential emergency and should be followed up as soon as possible or go to the Emergency Department if any problems  should occur.  Please show the CHEMOTHERAPY ALERT CARD or IMMUNOTHERAPY ALERT CARD at check-in to the Emergency Department and triage nurse.  Should you have questions after your visit or need to cancel or reschedule your appointment, please contact [facility] [place] [phone]  and follow the prompts.  Office hours are 8:00 a.m. to 4:30 p.m. Monday - Friday. Please note that voicemails left after 4:00 p.m. may not be returned until the following business day.  We are closed weekends and major holidays. You have access to a nurse at all times for urgent questions. Please call the main number to the clinic [phone] and follow the prompts.  For any non-urgent questions, you may also contact your provider using MyChart. We now offer e-Visits for anyone 18 and older to request care online for non-urgent symptoms. For details visit [email or web address].   Also download the MyChart app! Go to the app store, search "MyChart", open the app, select [place], and log in with your MyChart username and password.   

## 2022-12-24 ENCOUNTER — Ambulatory Visit (HOSPITAL_COMMUNITY)
Admission: RE | Admit: 2022-12-24 | Discharge: 2022-12-24 | Disposition: A | Discharge status: Home / Self Care | Payer: Medicare Other | Source: Ambulatory Visit | Attending: Hematology | Admitting: Hematology

## 2022-12-24 ENCOUNTER — Other Ambulatory Visit: Payer: Self-pay

## 2022-12-24 ENCOUNTER — Telehealth: Payer: Self-pay

## 2022-12-24 DIAGNOSIS — C221 Intrahepatic bile duct carcinoma: Secondary | ICD-10-CM

## 2022-12-24 DIAGNOSIS — F17210 Nicotine dependence, cigarettes, uncomplicated: Secondary | ICD-10-CM | POA: Insufficient documentation

## 2022-12-24 DIAGNOSIS — E1165 Type 2 diabetes mellitus with hyperglycemia: Secondary | ICD-10-CM

## 2022-12-24 HISTORY — PX: IR IMAGING GUIDED PORT INSERTION: IMG5740

## 2022-12-24 LAB — GLUCOSE, CAPILLARY: Glucose-Capillary: 93 mg/dL (ref 70–99)

## 2022-12-24 MED ORDER — FENTANYL CITRATE (PF) 100 MCG/2ML IJ SOLN
INTRAMUSCULAR | Status: AC | PRN
  Administered 2022-12-24: 12.5 ug via INTRAVENOUS

## 2022-12-24 MED ORDER — LIDOCAINE-EPINEPHRINE 1 %-1:100000 IJ SOLN
INTRAMUSCULAR | Status: AC
  Filled 2022-12-24: qty 1

## 2022-12-24 MED ORDER — LIDOCAINE-EPINEPHRINE (PF) 1 %-1:200000 IJ SOLN
10.0000 mL | Freq: Once | INTRAMUSCULAR | Status: AC
  Administered 2022-12-24: 10 mL

## 2022-12-24 MED ORDER — HEPARIN SOD (PORK) LOCK FLUSH 100 UNIT/ML IV SOLN
500.0000 [IU] | Freq: Once | INTRAVENOUS | Status: AC
  Administered 2022-12-24: 500 [IU] via INTRAVENOUS

## 2022-12-24 MED ORDER — HEPARIN SOD (PORK) LOCK FLUSH 100 UNIT/ML IV SOLN
INTRAVENOUS | Status: AC
  Filled 2022-12-24: qty 5

## 2022-12-24 MED ORDER — SODIUM CHLORIDE 0.9 % IV SOLN: INTRAVENOUS | Status: DC

## 2022-12-24 MED ORDER — FENTANYL CITRATE (PF) 100 MCG/2ML IJ SOLN
INTRAMUSCULAR | Status: AC
  Filled 2022-12-24: qty 2

## 2022-12-24 MED ORDER — MIDAZOLAM HCL 2 MG/2ML IJ SOLN
INTRAMUSCULAR | Status: AC
  Filled 2022-12-24: qty 2

## 2022-12-24 NOTE — H&P (Addendum)
Chief Complaint: Metastatic Cholangiocarcinoma  Referring Provider(s): Ellin Saba  Supervising Physician: Roanna Banning  Patient Status: [facility] - Out-pt  History of Present Illness: [facility] is a 67 y.o. male who is well known to our service for routine paracentesis.  In July of 2022 he presented with jaundice  MRI showed a 2 cm mass involving the common hepatic duct with upstream biliary dilatation.  In August of 2022 he had a consult at [facility] with GI and surgery which confirmed diagnosis of perihilar cholangiocarcinoma.   ERCP done 10/23/2022: Prior sphincterotomy open.  Single severe biliary stricture found in the left main hepatic duct, malignant appearing.  Stricture successfully dilated and a plastic biliary stent was placed into the left hepatic duct.  He is here today for placement of a tunneled catheter with port.  He is NPO. He has had some significant weight loss. He complaints of being a little dizzy today. No nausea/vomiting. No Fever/chills. ROS negative.   Past Medical History:  Diagnosis Date   Cancer (HCC)    CHF (congestive heart failure) (HCC)    Chronic pain    neck, back, knees   Class 1 obesity due to excess calories with body mass index (BMI) of 31.0 to 31.9 in adult    Claudication [facility])    Coronary artery disease    DDD (degenerative disc disease), cervical    Diabetes mellitus (HCC)    Dyslipidemia    Hypertension    Port-A-Cath in place 07/20/2021   Tobacco dependence     Past Surgical History:  Procedure Laterality Date   BILIARY BRUSHING N/A 02/25/2021   Procedure: BILIARY BRUSHING;  Surgeon: Malissa Hippo, MD;  Location: [facility];  Service: Gastroenterology;  Laterality: N/A;   BILIARY DILATION  10/23/2022   Procedure: BILIARY DILATION;  Surgeon: Meridee Score Netty Starring., MD;  Location: [facility] ENDOSCOPY;  Service: Gastroenterology;;   BILIARY STENT PLACEMENT N/A 02/25/2021   Procedure: BILIARY STENT PLACEMENT;  Surgeon: Malissa Hippo, MD;  Location: [facility];  Service: Gastroenterology;  Laterality: N/A;   BILIARY STENT PLACEMENT  02/27/2021   Procedure: BILIARY STENT PLACEMENT (10FR x 9cm) IN THE RIGHT SYSTEM;  Surgeon: Malissa Hippo, MD;  Location: [facility];  Service: Gastroenterology;;   BILIARY STENT PLACEMENT  10/23/2022   Procedure: BILIARY STENT PLACEMENT;  Surgeon: Lemar Lofty., MD;  Location: [facility] ENDOSCOPY;  Service: Gastroenterology;;   BREAST SURGERY Left    benign lump- in his 90s.   CARDIAC CATHETERIZATION     CYSTOSCOPY WITH LITHOLAPAXY N/A 11/19/2022   Procedure: CYSTOSCOPY WITH LITHOLAPAXY;  Surgeon: Malen Gauze, MD;  Location: [facility];  Service: Urology;  Laterality: N/A;   ERCP N/A 02/25/2021   Procedure: ENDOSCOPIC RETROGRADE CHOLANGIOPANCREATOGRAPHY (ERCP);  Surgeon: Malissa Hippo, MD;  Location: [facility];  Service: Gastroenterology;  Laterality: N/A;   ERCP N/A 02/27/2021   Procedure: ENDOSCOPIC RETROGRADE CHOLANGIOPANCREATOGRAPHY (ERCP);  Surgeon: Malissa Hippo, MD;  Location: [facility];  Service: Gastroenterology;  Laterality: N/A;   ERCP N/A 10/23/2022   Procedure: ENDOSCOPIC RETROGRADE CHOLANGIOPANCREATOGRAPHY (ERCP);  Surgeon: Lemar Lofty., MD;  Location: [facility] ENDOSCOPY;  Service: Gastroenterology;  Laterality: N/A;   ERCP N/A 11/09/2022   Procedure: ENDOSCOPIC RETROGRADE CHOLANGIOPANCREATOGRAPHY (ERCP);  Surgeon: Meryl Dare, MD;  Location: [name] ENDOSCOPY;  Service: Gastroenterology;  Laterality: N/A;  Stent removal   HOLMIUM LASER APPLICATION N/A 11/19/2022   Procedure: HOLMIUM LASER APPLICATION;  Surgeon: Malen Gauze, MD;  Location: [facility];  Service: Urology;  Laterality: N/A;   IR IMAGING GUIDED PORT INSERTION  07/21/2021   IR PARACENTESIS  11/13/2022   IR REMOVAL TUN ACCESS W/ PORT W/O FL MOD SED  09/30/2022   KNEE SURGERY Left    x2   LEFT HEART CATH AND CORONARY ANGIOGRAPHY N/A 09/08/2021   Procedure: LEFT HEART CATH AND CORONARY ANGIOGRAPHY;   Surgeon: Orbie Pyo, MD;  Location: [facility];  Service: Cardiovascular;  Laterality: N/A;   REMOVAL OF STONES  10/23/2022   Procedure: REMOVAL OF STONES;  Surgeon: Meridee Score Netty Starring., MD;  Location: [facility] ENDOSCOPY;  Service: Gastroenterology;;   REMOVAL OF STONES  11/09/2022   Procedure: REMOVAL OF STONES;  Surgeon: Meryl Dare, MD;  Location: [facility];  Service: Gastroenterology;;   SPHINCTEROTOMY N/A 02/25/2021   Procedure: Dennison Mascot;  Surgeon: Malissa Hippo, MD;  Location: [facility];  Service: Gastroenterology;  Laterality: N/A;   STENT REMOVAL  02/27/2021   Procedure: STENT REMOVAL (8.5Fr x 9cm);  Surgeon: Malissa Hippo, MD;  Location: [facility];  Service: Gastroenterology;;   Francine Graven REMOVAL  11/09/2022   Procedure: STENT REMOVAL;  Surgeon: Meryl Dare, MD;  Location: [facility] ENDOSCOPY;  Service: Gastroenterology;;    Allergies: Patient has no known allergies.  Medications: Prior to Admission medications   Medication Sig Start Date End Date Taking? Authorizing Provider  alfuzosin (UROXATRAL) 10 MG 24 hr tablet Take 1 tablet (10 mg total) by mouth at bedtime. 08/21/22  Yes McKenzie, Mardene Celeste, MD  famotidine (PEPCID) 20 MG tablet Take 20 mg by mouth 2 (two) times daily.   Yes [provider]  ferrous sulfate (FERROUSUL) 325 (65 FE) MG tablet Take 1 tablet (325 mg total) by mouth daily with breakfast. 03/18/21  Yes Rehman, Joline Maxcy, MD  glipiZIDE (GLUCOTROL) 5 MG tablet Take 1 tablet (5 mg total) by mouth daily before breakfast. 12/14/22  Yes Doreatha Massed, MD  guaifenesin (ROBITUSSIN) 100 MG/5ML syrup Take 200 mg by mouth 3 (three) times daily as needed for cough.   Yes [provider]  midodrine (PROAMATINE) 5 MG tablet Take 1 tablet (5 mg total) by mouth 3 (three) times daily with meals. Patient taking differently: Take 10 mg by mouth 3 (three) times daily with meals. 11/27/22  Yes Doreatha Massed, MD  ondansetron (ZOFRAN) 4 MG tablet  Take 1 tablet (4 mg total) by mouth every 6 (six) hours as needed for nausea. 11/16/22  Yes Pokhrel, Laxman, MD  pantoprazole (PROTONIX) 40 MG tablet Take 1 tablet (40 mg total) by mouth daily. 12/05/21  Yes O'Neal, Ronnald Ramp, MD  prochlorperazine (COMPAZINE) 10 MG tablet Take 1 tablet (10 mg total) by mouth every 6 (six) hours as needed for nausea or vomiting. 11/27/22  Yes Doreatha Massed, MD  albuterol (VENTOLIN HFA) 108 (90 Base) MCG/ACT inhaler Inhale 2 puffs into the lungs every 6 (six) hours as needed for wheezing or shortness of breath. 11/29/21   Tat, Onalee Hua, MD  carvedilol (COREG) 3.125 MG tablet Take 3.125 mg by mouth 2 (two) times daily with a meal.    [provider]  clindamycin (CLEOCIN) 300 MG capsule Take 300 mg by mouth every 6 (six) hours. 12/07/22   [provider]  doxycycline (VIBRA-TABS) 100 MG tablet Take 100 mg by mouth 2 (two) times daily. 12/07/22   [provider]  durvalumab 1,500 mg in sodium chloride 0.9 % 100 mL Inject 1,500 mg into the vein every 21 ( twenty-one) days.  [provider]  gemcitabine 760 mg/m2 in sodium chloride 0.9 % 100 mL Inject 760 mg/m2 into the vein once a week. On days 1 and 8 every 21 days    [provider]  Misc. Devices KIT 1 Box by Does not apply route every 7 (seven) days. 10/20/22   Doreatha Massed, MD  Multiple Vitamin (MULTIVITAMIN WITH MINERALS) TABS tablet Take 1 tablet by mouth daily. 11/17/22 02/25/23  Pokhrel, Rebekah Chesterfield, MD  sodium chloride flush (NS) 0.9 % SOLN 10 mLs by Intracatheter route every 7 (seven) days. Patient taking differently: 10 mLs by Intracatheter route daily. 10/20/22   Doreatha Massed, MD  Wound Dressings (INTRASITE GEL APPLIPAK) GEL Apply 1 Units topically daily. 10/05/22   Doreatha Massed, MD     Family History  Problem Relation Age of Onset   CAD Mother    Cancer Neg Hx     Social History   Socioeconomic History   Marital status: Married    Spouse  name: Not on file   Number of children: Not on file   Years of education: Not on file   Highest education level: Not on file  Occupational History   Occupation: Heavy Arboriculturist  Tobacco Use   Smoking status: Every Day    Packs/day: 0.50    Years: 56.00    Additional pack years: 0.00    Total pack years: 28.00    Types: Cigarettes   Smokeless tobacco: Never  Vaping Use   Vaping Use: Never used  Substance and Sexual Activity   Alcohol use: Not Currently    Comment: stopped 2.5 years ago 03/11/21   Drug use: Never   Sexual activity: Not on file  Other Topics Concern   Not on file  Social History Narrative   Not on file   Social Determinants of Health   Financial Resource Strain: Medium Risk (03/07/2021)   Overall Financial Resource Strain (CARDIA)    Difficulty of Paying Living Expenses: Somewhat hard  Food Insecurity: No Food Insecurity (11/30/2022)   Hunger Vital Sign    Worried About Running Out of Food in the Last Year: Never true    Ran Out of Food in the Last Year: Never true  Transportation Needs: No Transportation Needs (11/30/2022)   PRAPARE - Administrator, Civil Service (Medical): No    Lack of Transportation (Non-Medical): No  Physical Activity: Insufficiently Active (03/07/2021)   Exercise Vital Sign    Days of Exercise per Week: 2 days    Minutes of Exercise per Session: 20 min  Stress: No Stress Concern Present (03/07/2021)   [employer] of Occupational Health - Occupational Stress Questionnaire    Feeling of Stress : Only a little  Social Connections: Moderately Integrated (03/07/2021)   Social Connection and Isolation Panel [NHANES]    Frequency of Communication with Friends and Family: More than three times a week    Frequency of Social Gatherings with Friends and Family: More than three times a week    Attends Religious Services: More than 4 times per year    Active Member of [employer] or Organizations: No    Attends Tax inspector Meetings: Never    Marital Status: Married     Review of Systems: A 12 point ROS discussed and pertinent positives are indicated in the HPI above.  All other systems are negative.  Review of Systems  Vital Signs: BP (!) 83/57   Pulse 95   Temp 97.7 F (36.5 C) (  Oral)   Resp 18   SpO2 95%   Physical Exam Vitals reviewed.  Constitutional:      Appearance: Normal appearance.  HENT:     Head: Normocephalic and atraumatic.  Eyes:     Extraocular Movements: Extraocular movements intact.  Cardiovascular:     Rate and Rhythm: Normal rate and regular rhythm.  Pulmonary:     Effort: Pulmonary effort is normal. No respiratory distress.     Breath sounds: Normal breath sounds.  Abdominal:     General: There is no distension.     Palpations: Abdomen is soft.     Tenderness: There is no abdominal tenderness.  Musculoskeletal:        General: Normal range of motion.     Cervical back: Normal range of motion.  Skin:    General: Skin is warm and dry.  Neurological:     General: No focal deficit present.     Mental Status: He is alert and oriented to person, place, and time.  Psychiatric:        Mood and Affect: Mood normal.        Behavior: Behavior normal.        Thought Content: Thought content normal.        Judgment: Judgment normal.      Labs:  CBC: Recent Labs    12/01/22 0441 12/02/22 0430 12/14/22 0748 12/21/22 1030  WBC 7.7 6.2 6.2 6.7  HGB 8.1* 7.6* 8.5* 9.4*  HCT 24.3* 22.8* 26.4* 28.4*  PLT 105* 88* 87* 114*    COAGS: Recent Labs    09/02/22 1359 10/22/22 1147 11/07/22 1213 11/30/22 1000  INR 3.0 1.2 1.3* 1.3*  APTT  --   --  39*  --     BMP: Recent Labs    12/01/22 0441 12/02/22 0430 12/14/22 0748 12/21/22 1030  NA 127* 129* 126* 121*  K 5.3* 4.6 5.7* 5.7*  CL 103 105 107 101  CO2 16* 17* 13* 14*  GLUCOSE 118* 128* 167* 169*  BUN 69* 60* 42* 58*  CALCIUM 8.2* 8.2* 8.6* 8.3*  CREATININE 2.98* 2.92* 2.21* 2.59*   GFRNONAA 22* 23* 32* 26*    LIVER FUNCTION TESTS: Recent Labs    12/01/22 0441 12/02/22 0430 12/14/22 0748 12/21/22 1030  BILITOT 3.1* 3.1* 2.5* 2.3*  AST 101* 101* 86* 98*  ALT 66* 66* 67* 86*  ALKPHOS 438* 429* 400* 461*  PROT 5.7* 5.3* 5.6* 5.4*  ALBUMIN 2.4* 2.5* 2.4* 2.2*    TUMOR MARKERS: No results for input(s): "AFPTM", "CEA", "CA199", "CHROMGRNA" in the last 8760 hours.  Assessment and Plan:  Metastatic cholangiocarcinoma  Will proceed with image guided placement of a Port A Cath today by Dr. Milford Cage.  Risks and benefits of image guided port-a-catheter placement was discussed with the patient including, but not limited to bleeding, infection, pneumothorax, or fibrin sheath development and need for additional procedures.  All of the patient's questions were answered, patient is agreeable to proceed. Consent signed and in chart.  He currently has a PICC in place. Once we place the port, we will remove the PICC.  Thank you for allowing our service to participate in REYLI DILIBERTO 's care.  Electronically Signed: Gwynneth Macleod, PA-C   12/24/2022, 12:49 PM      I spent a total of  15 Minutes in face to face in clinical consultation, greater than 50% of which was counseling/coordinating care for [facility] A Cath placement.

## 2022-12-24 NOTE — Procedures (Signed)
Vascular and Interventional Radiology Procedure Note  Patient: [facility] DOB: 1955-11-29 Medical Record Number: [id number] Note Date/Time: 12/24/22 2:05 PM   Performing Physician: Roanna Banning, MD Assistant(s): None  Diagnosis:  Met cholangioCA  Procedure:  PORT PLACEMENT RIGHT UPPER EXTREMITY PICC REMOVAL  Anesthesia: Conscious Sedation Complications: None Estimated Blood Loss: Minimal  Findings:  Successful right-sided port placement, with the tip of the catheter in the proximal right atrium. Removal of RUE PICC  Plan: Catheter ready for use.  See detailed procedure note with images in PACS. The patient tolerated the procedure well without incident or complication and was returned to Recovery in stable condition.    Roanna Banning, MD [facility]   Pager. [phone] Clinic. [phone]

## 2022-12-24 NOTE — Progress Notes (Signed)
Patient was given discharge instructions. He verbalized understanding. 

## 2022-12-24 NOTE — Telephone Encounter (Signed)
24 hour follow up call-left message for patient or wife to call back for any questions or concerns.

## 2022-12-25 ENCOUNTER — Other Ambulatory Visit: Payer: Self-pay | Admitting: Hematology

## 2022-12-25 ENCOUNTER — Encounter (HOSPITAL_COMMUNITY): Payer: Self-pay

## 2022-12-25 ENCOUNTER — Other Ambulatory Visit: Payer: Self-pay | Admitting: *Deleted

## 2022-12-25 DIAGNOSIS — C221 Intrahepatic bile duct carcinoma: Secondary | ICD-10-CM

## 2022-12-25 HISTORY — PX: IR PATIENT EVAL TECH 0-60 MINS: IMG5564

## 2022-12-27 NOTE — Progress Notes (Deleted)
[facility] [address], [place] [address] Phone: [phone] Fax: [phone]  SYMPTOM MANAGEMENT CLINIC PROGRESS NOTE   Jonathan Keith [id number] [birth year] 67 y.o.  Jonathan Keith is managed by Dr. Ellin Saba for his metastatic cholangiocarcinoma  Actively treated with chemotherapy/immunotherapy/hormonal therapy: YES  Current therapy: FOLFOX  Last treated: 12/21/2022  INTERVAL HISTORY:  Chief Complaint: Chemotherapy follow-up & symptom management  Jonathan Keith is managed by Dr. Ellin Saba for metastatic cholangiocarcinoma.  He was previously treated with cisplatin-Durvalumab-gemcitabine since 07/22/2021, but was no longer able to tolerate this due to worsening CKD.  Therapy switched to FOLFOX with day #1 / cycle #1 on 12/21/2022 and pump D/C on 12/23/2022.  Since that time, he has been feeling ***. *** Nausea/vomiting/diarrhea *** Mouth sores *** Fatigue - energy ***% *** Appetite ***% *** Oral intake (food) *** Water/liquids *** *** Weight today is ***.  <<OTHER SYMPTOMS>> *** Changes in urine *** Rash or skin changes *** Swelling/fluid buildup *** Chest pain, SOB, DOE *** Neuropathy *** New pain (joint pain, headaches, leg pain) *** Bruising or bleeding *** B symptoms    OXALIPLATIN: *** Neuropathy, *** cold sensitivity, *** fatigue, *** nausea/vomiting, *** diarrhea, *** constipation, *** anorexia, *** fever, *** alopecia (Myelosuppression, increased LFTs)  5-FLUOROURACIL: *** nausea/vomiting, *** mucositis, *** diarrhea, *** palmar/plantar erythrodysesthesia (Myelosuppression)   ASSESSMENT & PLAN:  ## METASTATIC CHOLANGIOCARCINOMA - Primary medical oncologist is Dr. Ellin Saba - Previously treated with cisplatin-Durvalumab-gemcitabine since 07/22/2021, but was no longer able to tolerate this due to worsening CKD. - Therapy switched to FOLFOX with day #1 / cycle #1 on 12/21/2022 and pump D/C on 12/23/2022. - PLAN:  ***  # Abdominal distention - Paracentesis on 12/02/2022 with 4 L drained. - Paracentesis on 12/09/2022 with 6 L drained. - Paracentesis on 12/17/2022 with 4 L drained - *** - Lasix on hold due to hypotension. - *** - PLAN: ***  #  Diabetes: - Because of worsening creatinine, metformin was held and switched to glipizide 5 mg daily with breakfast. - *** - PLAN: ***  # Left ventricular thrombus (Dx 06/04/2022): - Coumadin discontinued by cardiology on 09/14/2022 as repeat echo did not show LV thrombus.  Continue aspirin 81 mg daily. - Echocardiogram (11/10/2022): EF 40-45%. - *** - PLAN: ***  # Hypotension: - He was taking midodrine 5 mg 3 times daily. - Increased to midodrine 10 mg TID due to hypotension (BP 86/58) at last appointment on 12/14/2022  - *** - PLAN: ***  # Hyperkalemia - *** Appointment with Dr. Wolfgang Phoenix on 12/21/2022???  *** - PLAN: ***  # Hyponatremia - *** - PLAN: ***    # CKD stage IV - *** Appointment with Dr. Wolfgang Phoenix on 12/21/2022???  *** - *** - PLAN: ***    #  Social history/family: - Lives at home with his wife.  He worked as a Psychologist, forensic. - Current active smoker, 1 pack/day since age 55. - Maternal grandmother had cancer.  Maternal uncle had brain tumor.   PLAN SUMMARY: >> *** >> *** >> *** >> Next scheduled appointment with main oncologist: ***   REVIEW OF SYSTEMS: ***  Review of Systems  Past Medical History, Surgical history, Social history, and Family history were reviewed as documented elsewhere in chart, and were updated as appropriate.   OBJECTIVE:  Physical Exam: *** There were no vitals taken for this visit. ECOG: ***  Physical Exam  Lab Review:     Component Value Date/Time  NA 121 (L) 12/21/2022 1030   NA 136 06/16/2021 0925   K 5.7 (H) 12/21/2022 1030   CL 101 12/21/2022 1030   CO2 14 (L) 12/21/2022 1030   GLUCOSE 169 (H) 12/21/2022 1030   BUN 58 (H) 12/21/2022 1030   BUN 12 06/16/2021 0925   CREATININE  2.59 (H) 12/21/2022 1030   CALCIUM 8.3 (L) 12/21/2022 1030   PROT 5.4 (L) 12/21/2022 1030   PROT 6.8 06/16/2021 0925   ALBUMIN 2.2 (L) 12/21/2022 1030   ALBUMIN 3.7 (L) 06/16/2021 0925   AST 98 (H) 12/21/2022 1030   ALT 86 (H) 12/21/2022 1030   ALKPHOS 461 (H) 12/21/2022 1030   BILITOT 2.3 (H) 12/21/2022 1030   BILITOT 1.7 (H) 06/16/2021 0925   GFRNONAA 26 (L) 12/21/2022 1030       Component Value Date/Time   WBC 6.7 12/21/2022 1030   RBC 2.76 (L) 12/21/2022 1030   HGB 9.4 (L) 12/21/2022 1030   HGB 12.1 (L) 06/16/2021 0925   HCT 28.4 (L) 12/21/2022 1030   HCT 36.6 (L) 06/16/2021 0925   PLT 114 (L) 12/21/2022 1030   PLT 261 06/16/2021 0925   MCV 102.9 (H) 12/21/2022 1030   MCV 88 06/16/2021 0925   MCH 34.1 (H) 12/21/2022 1030   MCHC 33.1 12/21/2022 1030   RDW 14.7 12/21/2022 1030   RDW 14.2 06/16/2021 0925   LYMPHSABS 0.9 12/21/2022 1030   LYMPHSABS 2.0 06/16/2021 0925   MONOABS 0.8 12/21/2022 1030   EOSABS 0.1 12/21/2022 1030   EOSABS 0.5 (H) 06/16/2021 0925   BASOSABS 0.0 12/21/2022 1030   BASOSABS 0.1 06/16/2021 0925   -------------------------------  Imaging from last 24 hours (if applicable): Radiology interpretation: IR IMAGING GUIDED PORT INSERTION  Result Date: 12/24/2022 INDICATION: chemotherapy administration Briefly, 67 year old male with a history of metastatic cholangiocarcinoma with prior port placement 07/31/2021 complicated by erosion with removal and PICC placement 09/30/2022. New port placement requested. EXAM: IMPLANTED PORT A CATH PLACEMENT WITH ULTRASOUND AND FLUOROSCOPIC GUIDANCE MEDICATIONS: None ANESTHESIA/SEDATION: Local anesthetic and single agent sedation was employed during this procedure. A total of fentanyl 12.5 mcg was administered intravenously. The patient's level of consciousness and vital signs were monitored continuously by radiology nursing throughout the procedure under my direct supervision. FLUOROSCOPY TIME:  Fluoroscopic dose; 1 mGy  COMPLICATIONS: None immediate. PROCEDURE: The procedure, risks, benefits, and alternatives were explained to the patient. Questions regarding the procedure were encouraged and answered. The patient understands and consents to the procedure. The RIGHT neck and chest were prepped with chlorhexidine in a sterile fashion, and a sterile drape was applied covering the operative field. Maximum barrier sterile technique with sterile gowns and gloves were used for the procedure. A timeout was performed prior to the initiation of the procedure. Local anesthesia was provided with 1% lidocaine with epinephrine. After creating a small venotomy incision, a micropuncture kit was utilized to access the internal jugular vein under direct, real-time ultrasound guidance. Ultrasound image documentation was performed. The microwire was kinked to measure appropriate catheter length. A subcutaneous port pocket was then created along the upper chest wall utilizing a combination of sharp and blunt dissection. The pocket was irrigated with sterile saline. A single lumen Non-ISP power injectable port was chosen for placement. The 8 Fr catheter was tunneled from the port pocket site to the venotomy incision. The port was placed in the pocket. The external catheter was trimmed to appropriate length. At the venotomy, an 8 Fr peel-away sheath was placed over a guidewire under  fluoroscopic guidance. The catheter was then placed through the sheath and the sheath was removed. Final catheter positioning was confirmed and documented with a fluoroscopic spot radiograph. The port was accessed with a Huber needle, aspirated and flushed with heparinized saline. The port pocket incision was closed with interrupted 3-0 Vicryl suture then Dermabond was applied, including at the venotomy incision. Dressings were placed. The patient tolerated the procedure well without immediate post procedural complication. IMPRESSION: Successful placement of a RIGHT internal  jugular approach power injectable Port-A-Cath. The tip of the catheter is positioned within the proximal RIGHT atrium. The catheter is ready for immediate use. Roanna Banning, MD [facility] Electronically Signed   By: Roanna Banning M.D.   On: 12/24/2022 15:13   US Paracentesis  Result Date: 12/23/2022 INDICATION: Patient with history of cholangiocarcinoma, recurrent ascites. Request for therapeutic paracentesis. EXAM: ULTRASOUND GUIDED THERAPEUTIC PARACENTESIS MEDICATIONS: 5 mL 1% lidocaine. COMPLICATIONS: None immediate. PROCEDURE: Informed written consent was obtained from the patient after a discussion of the risks, benefits and alternatives to treatment. A timeout was performed prior to the initiation of the procedure. Initial ultrasound scanning demonstrates a large amount of ascites within the left lower abdominal quadrant. The left lower abdomen was prepped and draped in the usual sterile fashion. 1% lidocaine was used for local anesthesia. Following this, a 19 gauge, 7-cm, Yueh catheter was introduced. An ultrasound image was saved for documentation purposes. The paracentesis was performed. The catheter was removed and a dressing was applied. The patient tolerated the procedure well without immediate post procedural complication. Patient received post-procedure intravenous albumin; see nursing notes for details. FINDINGS: A total of approximately 6.0 L of cloudy light yellow fluid was removed. IMPRESSION: Successful ultrasound-guided paracentesis yielding 6.0 liters of peritoneal fluid. Performed by Lynnette Caffey, PA-C Electronically Signed   By: Acquanetta Belling M.D.   On: 12/23/2022 14:26   US Paracentesis  Result Date: 12/17/2022 INDICATION: Patient with history of cholangiocarcinoma, recurrent ascites. Request received for therapeutic paracentesis EXAM: ULTRASOUND GUIDED RLQ PARACENTESIS MEDICATIONS: 8 cc 1% lidocaine. COMPLICATIONS: None  immediate. PROCEDURE: Informed written consent was obtained from the patient after a discussion of the risks, benefits and alternatives to treatment. A timeout was performed prior to the initiation of the procedure. Initial ultrasound scanning demonstrates a large amount of ascites within the right lower abdominal quadrant. The right lower abdomen was prepped and draped in the usual sterile fashion. 1% lidocaine was used for local anesthesia. Following this, a 5 [place] Yueh catheter was introduced. An ultrasound image was saved for documentation purposes. The paracentesis was performed. The catheter was removed and a dressing was applied. The patient tolerated the procedure well without immediate post procedural complication. Patient received post-procedure intravenous albumin; see nursing notes for details. FINDINGS: A total of approximately 4 L of cloudy yellow fluid was removed. Ordering provider did not request laboratory samples. IMPRESSION: Successful ultrasound-guided paracentesis yielding 4 liters of peritoneal fluid. Procedure performed by Mina Marble, PA-C Electronically Signed   By: Ulyses Southward M.D.   On: 12/17/2022 12:53   US Paracentesis  Result Date: 12/09/2022 INDICATION: Patient with history of cholangiocarcinoma, recurrent ascites. Request for therapeutic paracentesis. EXAM: ULTRASOUND GUIDED THERAPEUTIC PARACENTESIS MEDICATIONS: 7 mL 1% lidocaine COMPLICATIONS: None immediate. PROCEDURE: Informed written consent was obtained from the patient after a discussion of the risks, benefits and alternatives to treatment. A timeout was performed prior to the initiation of the procedure. Initial ultrasound scanning demonstrates a large amount of ascites  within the left lower abdominal quadrant. The left lower abdomen was prepped and draped in the usual sterile fashion. 1% lidocaine was used for local anesthesia. Following this, a 19 gauge, 7-cm, Yueh catheter was introduced. An ultrasound image was  saved for documentation purposes. The paracentesis was performed. The catheter was removed and a dressing was applied. The patient tolerated the procedure well without immediate post procedural complication. Patient received post-procedure intravenous albumin; see nursing notes for details. FINDINGS: A total of approximately 6.0 L of hazy yellow fluid was removed. IMPRESSION: Successful ultrasound-guided paracentesis yielding 6.0 liters of peritoneal fluid. Performed by Lynnette Caffey, PA-C Electronically Signed   By: Ulyses Southward M.D.   On: 12/09/2022 12:51   US Paracentesis  Result Date: 12/02/2022 INDICATION: Patient with a history of cholangiocarcinoma with recurrent ascites. Interventional Radiology asked to perform a therapeutic paracentesis to 4L. EXAM: ULTRASOUND GUIDED PARACENTESIS MEDICATIONS: 1% lidocaine 10 ml COMPLICATIONS: None immediate. PROCEDURE: Informed written consent was obtained from the patient after a discussion of the risks, benefits and alternatives to treatment. A timeout was performed prior to the initiation of the procedure. Initial ultrasound scanning demonstrates a large amount of ascites within the left lower abdominal quadrant. The left lower abdomen was prepped and draped in the usual sterile fashion. 1% lidocaine was used for local anesthesia. Following this, a 19 gauge, 7-cm, Yueh catheter was introduced. An ultrasound image was saved for documentation purposes. The paracentesis was performed. The catheter was removed and a dressing was applied. The patient tolerated the procedure well without immediate post procedural complication. Patient received post-procedure intravenous albumin; see nursing notes for details. FINDINGS: A total of approximately 4 L of cloudy yellow fluid was removed. IMPRESSION: Successful ultrasound-guided paracentesis yielding 4 liters of peritoneal fluid. Read by: Alwyn Ren, NP Electronically Signed   By: Ulyses Southward M.D.   On: 12/02/2022 16:00    US RENAL  Result Date: 12/01/2022 CLINICAL DATA:  Acute on chronic renal failure. EXAM: RENAL / URINARY TRACT ULTRASOUND COMPLETE COMPARISON:  CT scan of November 12, 2022. FINDINGS: Right Kidney: Renal measurements: 10.5 x 5.6 x 4.5 cm = volume: 138 mL. Increased echogenicity of renal parenchyma is noted suggesting medical renal disease. No mass or hydronephrosis visualized. Left Kidney: Renal measurements: 9.5 x 5.7 x 4.7 cm = volume: 132 mL. Increased echogenicity of renal parenchyma is noted suggesting medical renal disease. No mass or hydronephrosis visualized. Bladder: Decompressed secondary to Foley catheter. Other: Moderate ascites is noted. IMPRESSION: Increased echogenicity of renal parenchyma is noted bilaterally suggesting medical renal disease. No hydronephrosis or renal obstruction is noted. Moderate ascites. Electronically Signed   By: Lupita Raider M.D.   On: 12/01/2022 17:49      WRAP UP:  All questions were answered. The patient knows to call the clinic with any problems, questions or concerns.  Medical decision making: ***  Time spent on visit: I spent {CHL ONC TIME VISIT - [id number]} counseling the patient face to face. The total time spent in the appointment was {CHL ONC TIME VISIT - [id number]} and more than 50% was on counseling.  Carnella Guadalajara, PA-C  ***

## 2022-12-28 ENCOUNTER — Ambulatory Visit: Payer: Medicare Other

## 2022-12-28 ENCOUNTER — Ambulatory Visit: Payer: Medicare Other | Admitting: Hematology

## 2022-12-28 ENCOUNTER — Inpatient Hospital Stay: Payer: Medicare Other | Admitting: Physician Assistant

## 2022-12-28 ENCOUNTER — Other Ambulatory Visit: Payer: Medicare Other

## 2022-12-28 ENCOUNTER — Inpatient Hospital Stay: Payer: Medicare Other

## 2023-01-05 ENCOUNTER — Inpatient Hospital Stay: Payer: Medicare Other

## 2023-01-05 ENCOUNTER — Inpatient Hospital Stay: Payer: Medicare Other | Admitting: Hematology

## 2023-01-07 ENCOUNTER — Encounter: Payer: Medicare Other | Admitting: Dietician

## 2023-01-07 ENCOUNTER — Inpatient Hospital Stay: Payer: Medicare Other

## 2023-01-09 DEATH — deceased

## 2023-01-12 ENCOUNTER — Other Ambulatory Visit: Payer: Medicare Other

## 2023-01-12 ENCOUNTER — Ambulatory Visit: Payer: Medicare Other | Admitting: Hematology

## 2023-01-12 ENCOUNTER — Ambulatory Visit: Payer: Medicare Other

## 2023-01-19 ENCOUNTER — Inpatient Hospital Stay: Payer: Medicare Other

## 2023-01-19 ENCOUNTER — Inpatient Hospital Stay: Payer: Medicare Other | Admitting: Hematology

## 2023-01-21 ENCOUNTER — Inpatient Hospital Stay: Payer: Medicare Other

## 2023-01-26 ENCOUNTER — Other Ambulatory Visit: Payer: Medicare Other

## 2023-01-26 ENCOUNTER — Ambulatory Visit: Payer: Medicare Other | Admitting: Hematology

## 2023-01-26 ENCOUNTER — Ambulatory Visit: Payer: Medicare Other

## 2023-03-12 ENCOUNTER — Ambulatory Visit: Payer: Medicare Other | Admitting: Internal Medicine

## 2023-07-30 IMAGING — XA IR IMAGING GUIDED PORT INSERTION
1 series · 2 of 2 positions shown · non-contrast
Comparison: none

INDICATION: Cholangiocarcinoma

[Series 1: fl (-) angio · 2 of 2 slices shown]
[im 1/2]
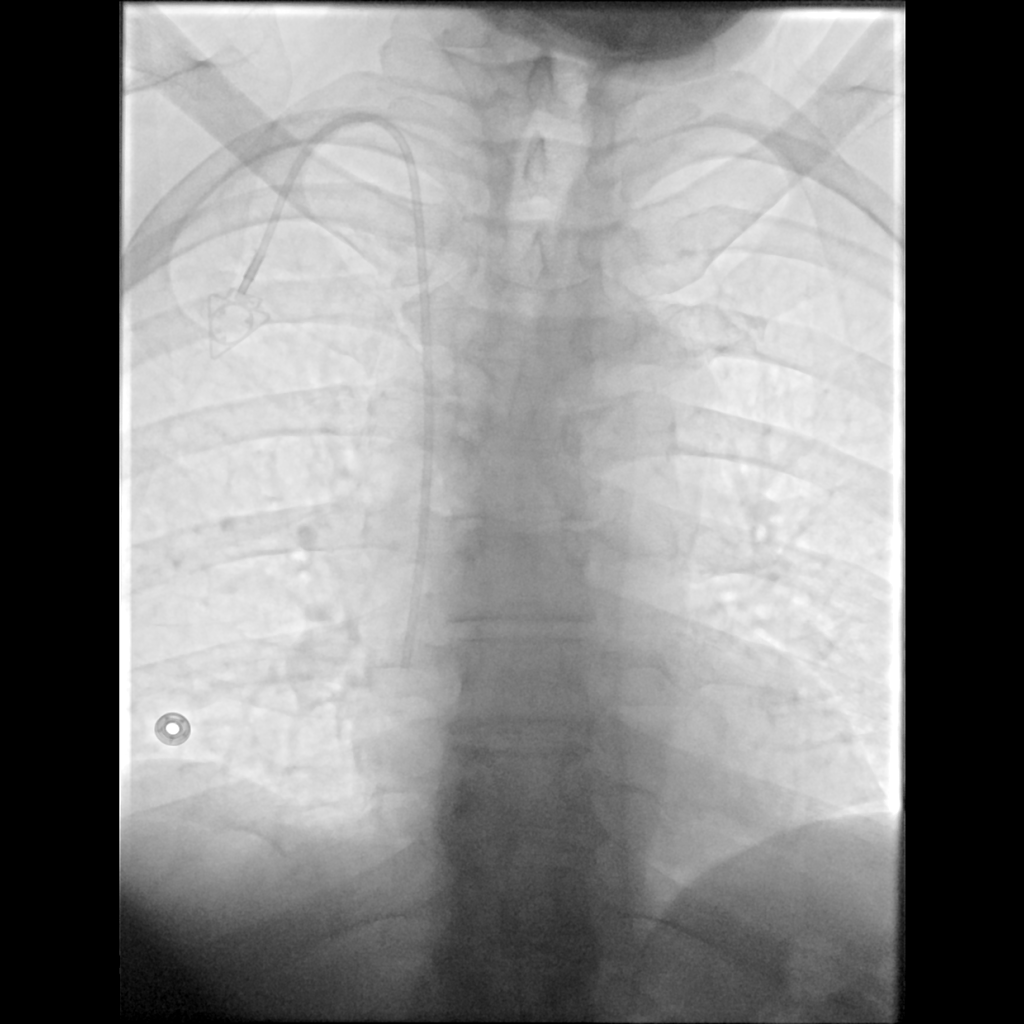
[im 2/2]
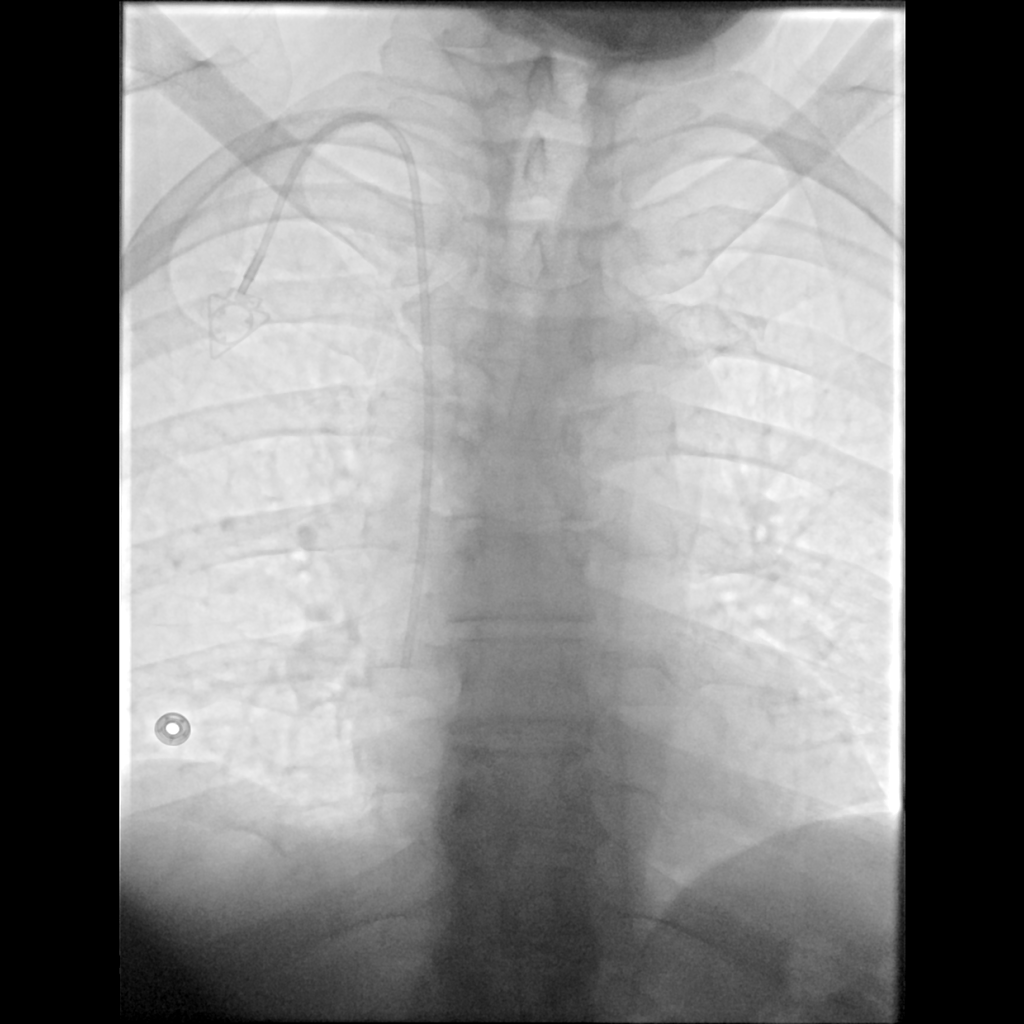

[2 of 2 positions shown; findings below may reference images not displayed]

EXAM:
IMPLANTED PORT A CATH PLACEMENT WITH ULTRASOUND AND FLUOROSCOPIC
GUIDANCE

MEDICATIONS:
None

ANESTHESIA/SEDATION:
Moderate (conscious) sedation was employed during this procedure. A
total of Versed 2 mg and Fentanyl 100 mcg was administered
intravenously.

Moderate Sedation Time: 15 minutes. The patient's level of
consciousness and vital signs were monitored continuously by
radiology nursing throughout the procedure under my direct
supervision.

FLUOROSCOPY TIME:  0.4 minutes (1 mGy)

COMPLICATIONS:
None immediate.

PROCEDURE:
The procedure, risks, benefits, and alternatives were explained to
the patient. Questions regarding the procedure were encouraged and
answered. The patient understands and consents to the procedure.

A timeout was performed prior to the initiation of the procedure.

Patient positioned supine on the angiography table.

Right neck and anterior upper chest prepped and draped in the usual
sterile fashion. All elements of maximal sterile barrier were
utilized including, cap, mask, sterile gown, sterile gloves, large
sterile drape, hand scrubbing and 2% Chlorhexidine for skin
cleaning.

The right internal jugular vein was evaluated with ultrasound and
shown to be patent. A permanent ultrasound image was obtained and
placed in the patient's medical record. Local anesthesia was
provided with 1% lidocaine with epinephrine.

Using sterile gel and a sterile probe cover, the right internal
jugular vein was entered with a 21 ga needle during real time
ultrasound guidance.

0.018 inch guidewire placed and 21 ga needle exchanged for
transitional dilator set. Utilizing fluoroscopy, 0.035 inch
guidewire advanced through the needle without difficulty.

Attention then turned to the right anterior upper chest. Following
local lidocaine administration, a port pocket was created. The
catheter was connected to the port and brought from the pocket to
the venotomy site through a subcutaneous tunnel.

The catheter was cut to size and inserted through the peel-away
sheath. The catheter tip was positioned at the cavoatrial junction
using fluoroscopic guidance.

The port aspirated and flushed well. The port pocket was closed with
deep and superficial absorbable suture. The port pocket incision and
venotomy sites were also sealed with Dermabond.
IMPRESSION: Successful placement of a right internal jugular approach power
injectable Port-A-Cath. The catheter is ready for immediate use.

## 2023-09-17 IMAGING — CT CT ANGIO CHEST
2 of 6 series · 17 of 46 positions shown · IV contrast (Omnipaque or Isovue)
Comparison: Chest CT 03/12/2021. Abdominal CT 05/25/2021.

CLINICAL DATA: Chest pain last night with elevated D-dimer levels.
Concern for pulmonary embolism. History of metastatic Zilamy
carcinoma, hypertension and diabetes.

EXAM:
CT ANGIOGRAPHY CHEST WITH CONTRAST
TECHNIQUE: Multidetector CT imaging of the chest was performed using the
standard protocol during bolus administration of intravenous
contrast. Multiplanar CT image reconstructions and MIPs were
obtained to evaluate the vascular anatomy.

[Series 5: pe axial thins · axial · 0.80mm/px · z∈[-449,-179]mm · 14 of 369 slices shown]
[im 16/369  lung]
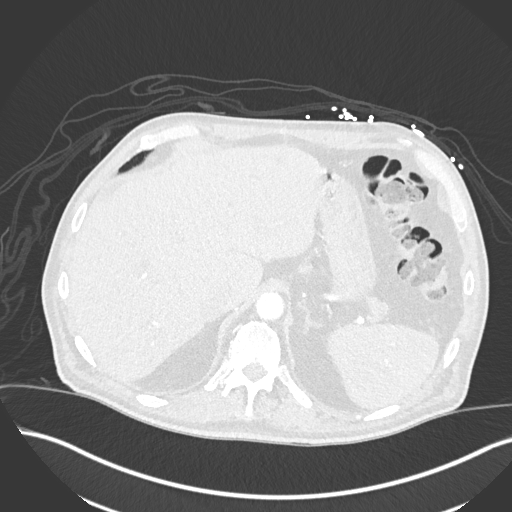
[im 47/369  soft-tissue]
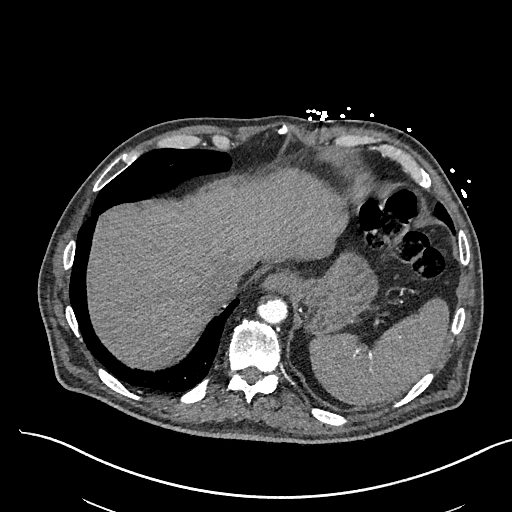
[im 77/369  lung]
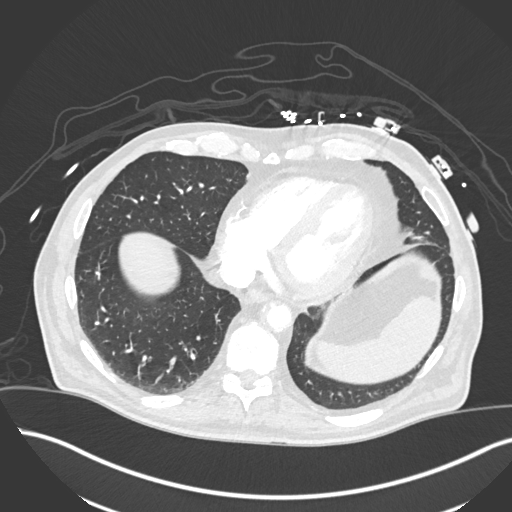
[im 93/369  soft-tissue]
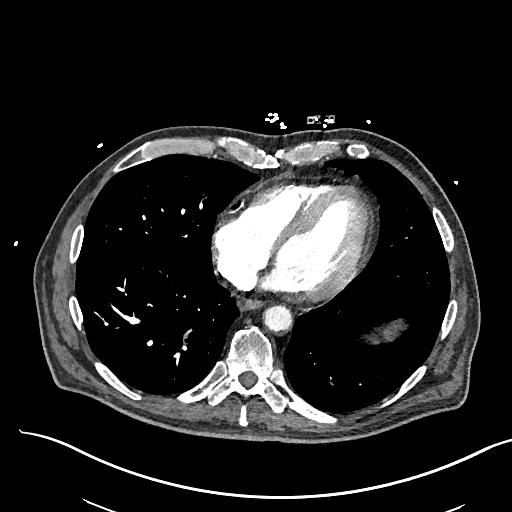
[im 123/369  lung]
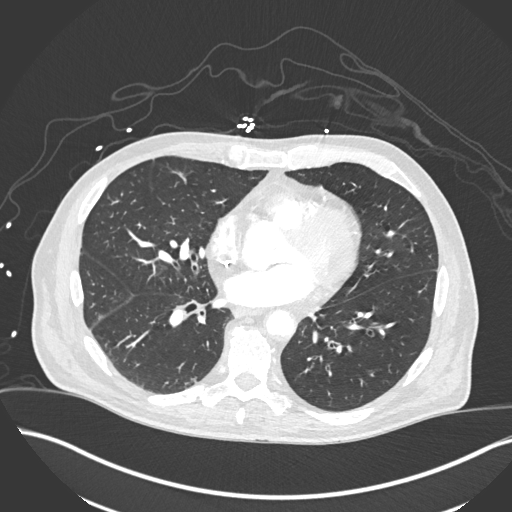
[im 154/369  soft-tissue]
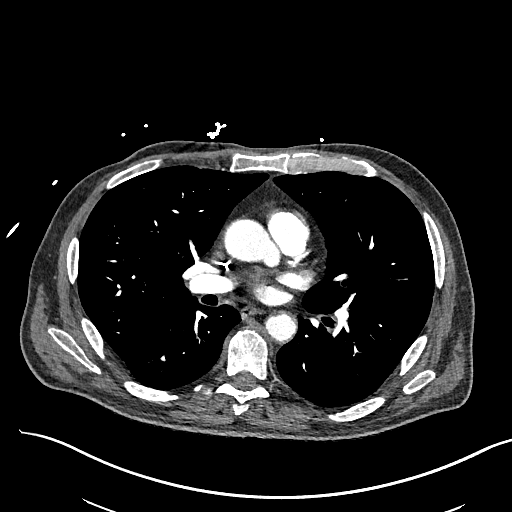
[im 169/369  lung]
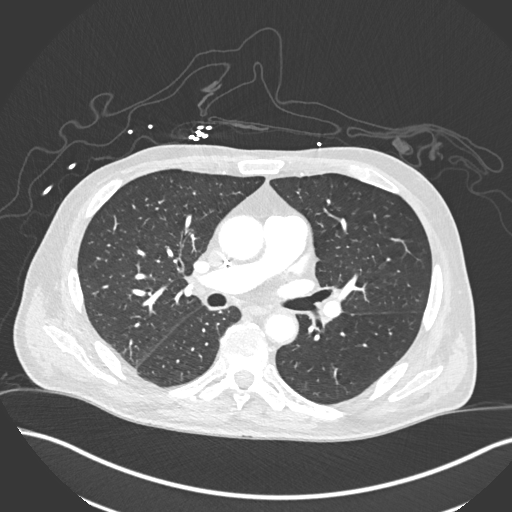
[im 200/369  soft-tissue]
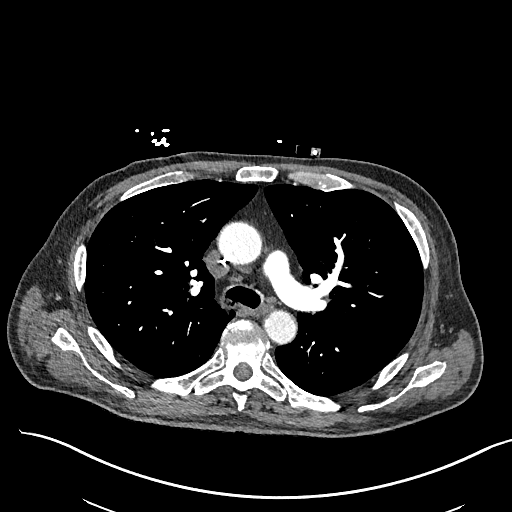
[im 215/369  lung]
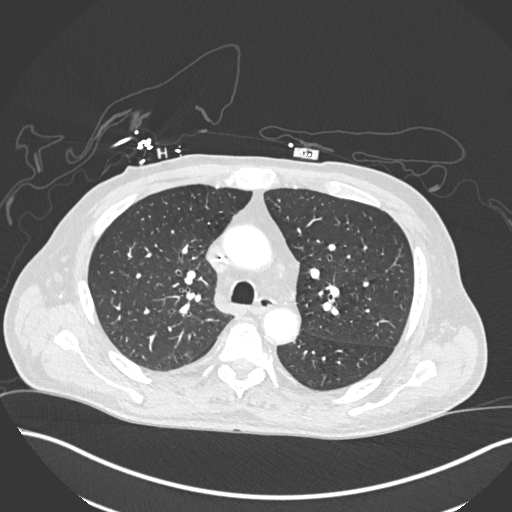
[im 246/369  soft-tissue]
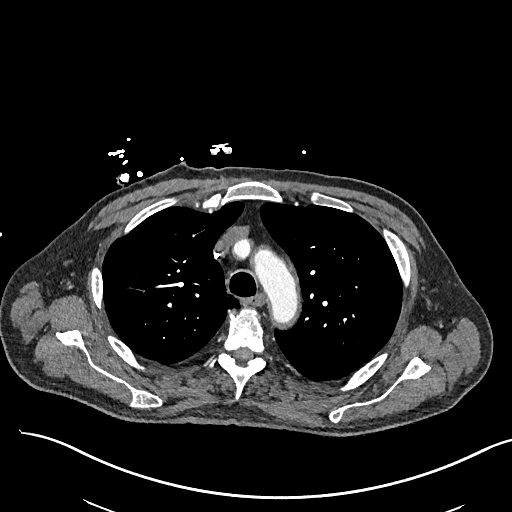
[im 277/369  lung]
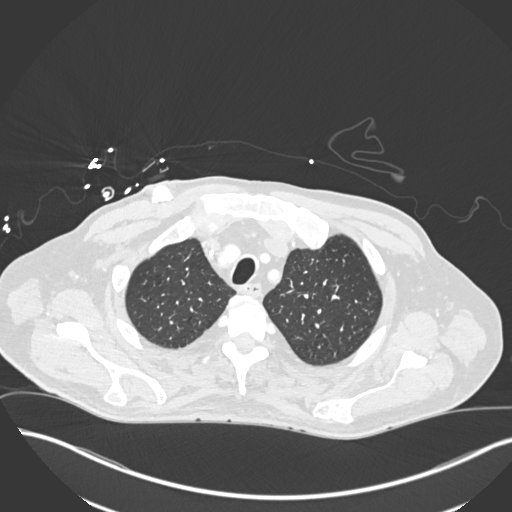
[im 292/369  soft-tissue]
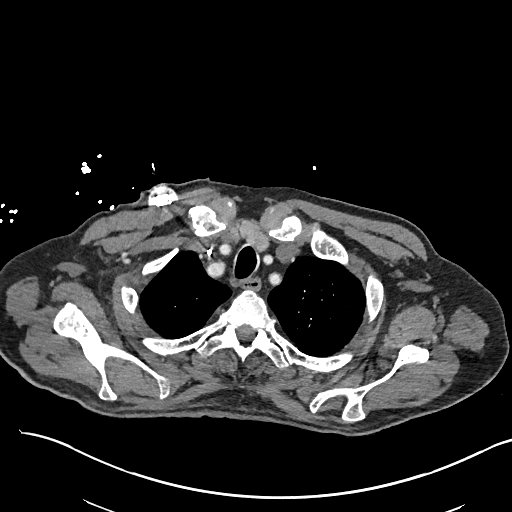
[im 323/369  lung]
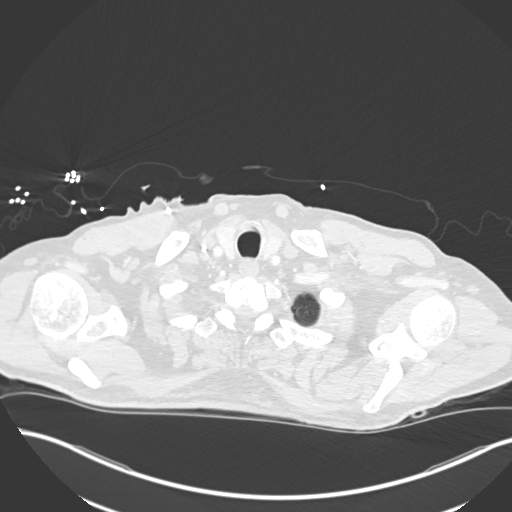
[im 353/369  soft-tissue]
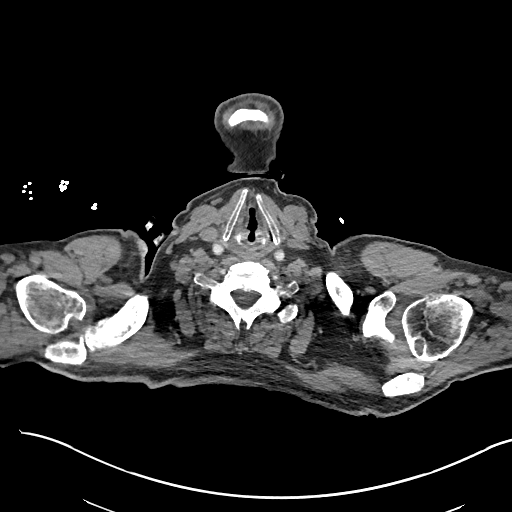

[Series 7: cor soft · coronal · 0.62mm/px · 3 of 140 slices shown]
[im 35/140  soft-tissue]
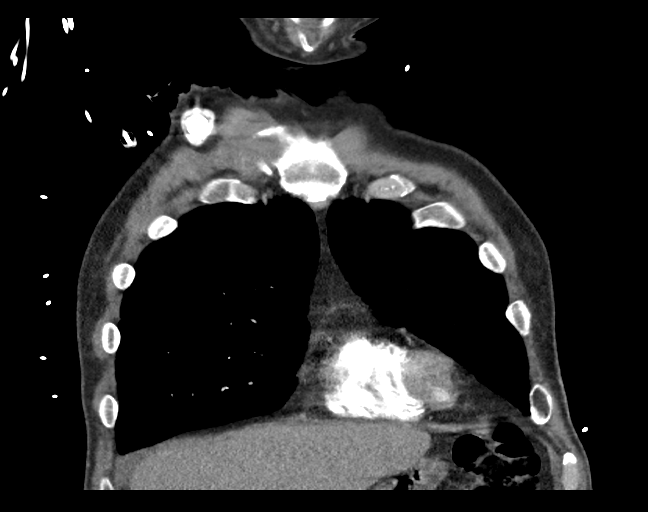
[im 70/140  soft-tissue]
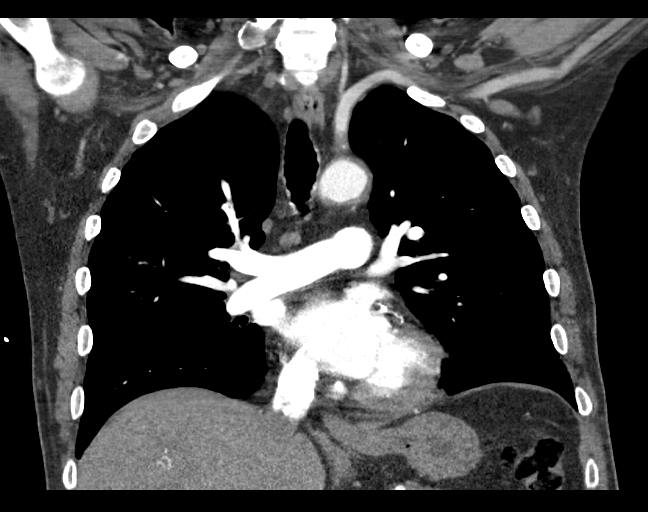
[im 105/140  soft-tissue]
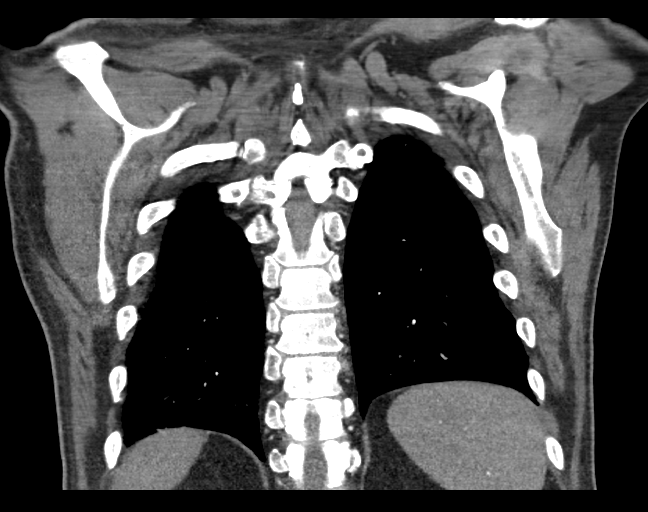

[17 of 46 positions shown; findings below may reference images not displayed]

RADIATION DOSE REDUCTION: This exam was performed according to the
departmental dose-optimization program which includes automated
exposure control, adjustment of the mA and/or kV according to
patient size and/or use of iterative reconstruction technique.

CONTRAST:  80mL OMNIPAQUE IOHEXOL 350 MG/ML SOLN
FINDINGS: Cardiovascular: The pulmonary arteries are well opacified with
contrast to the level of the subsegmental branches. There is no
evidence of acute pulmonary embolism. Right IJ Port-A-Cath extends
to the upper right atrium. Extensive coronary artery atherosclerosis
and intimal irregularity of the thoracic aorta are noted. No acute
systemic vascular findings are seen. The heart size is normal. There
is no pericardial effusion.

Mediastinum/Nodes: There are no enlarged mediastinal, hilar or
axillary lymph nodes.Small mediastinal lymph nodes are unchanged.
The thyroid gland, trachea and esophagus demonstrate no significant
findings.

Lungs/Pleura: No pleural effusion or pneumothorax. Mild diffuse
central airway thickening with scattered linear scarring in the
right lower lobe and lingula. No suspicious pulmonary nodules.
Probable dependent secretions within the right mainstem bronchus.

Upper abdomen: The visualized upper abdomen demonstrates no acute
findings. There is intrahepatic biliary dilatation which appears
similar to the prior study. No visualized pneumobilia within the
superior aspect of the liver.

Musculoskeletal/Chest wall: There is no chest wall mass or
suspicious osseous finding. Thoracic spondylosis.

Review of the MIP images confirms the above findings.
IMPRESSION: 1. No evidence of acute pulmonary embolism or other acute chest
findings.
2. Coronary and Aortic Atherosclerosis ([id number]).
3. Central airway thickening with probable dependent secretions in
the right mainstem bronchus.
4. No evidence of thoracic metastatic disease.
5. Grossly stable intrahepatic biliary dilatation, incompletely
visualized.

## 2023-09-17 IMAGING — DX DG CHEST 2V
2 series · 2 of 2 positions shown · non-contrast
Comparison: Prior chest radiographs 05/25/2021 and earlier. Chest
CT 03/12/2021.

CLINICAL DATA: Provided history: Chest pain. Additional history
provided: Patient reports history of bile duct cancer currently on
chemotherapy.

EXAM:
CHEST - 2 VIEW

[chest pa]
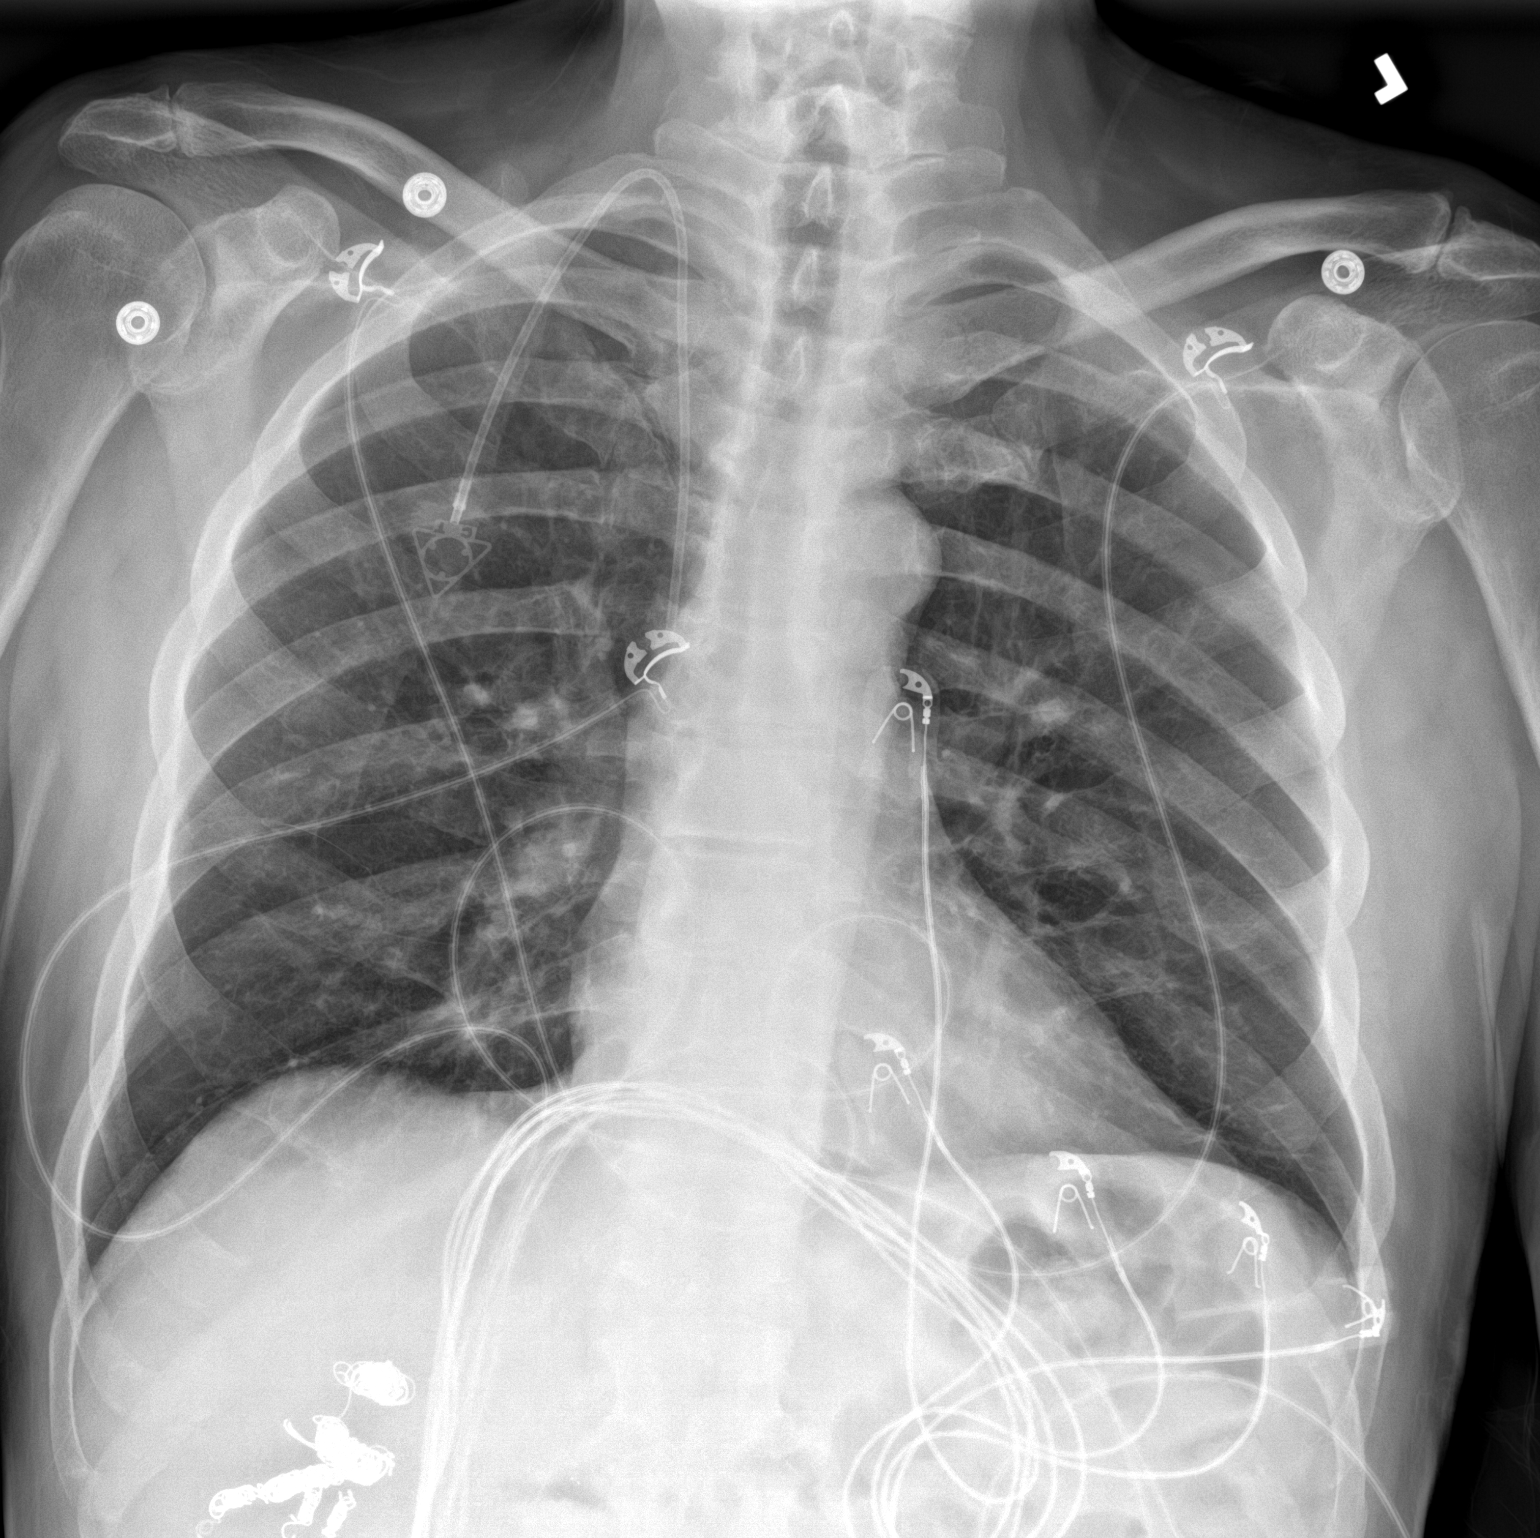

[chest lat]
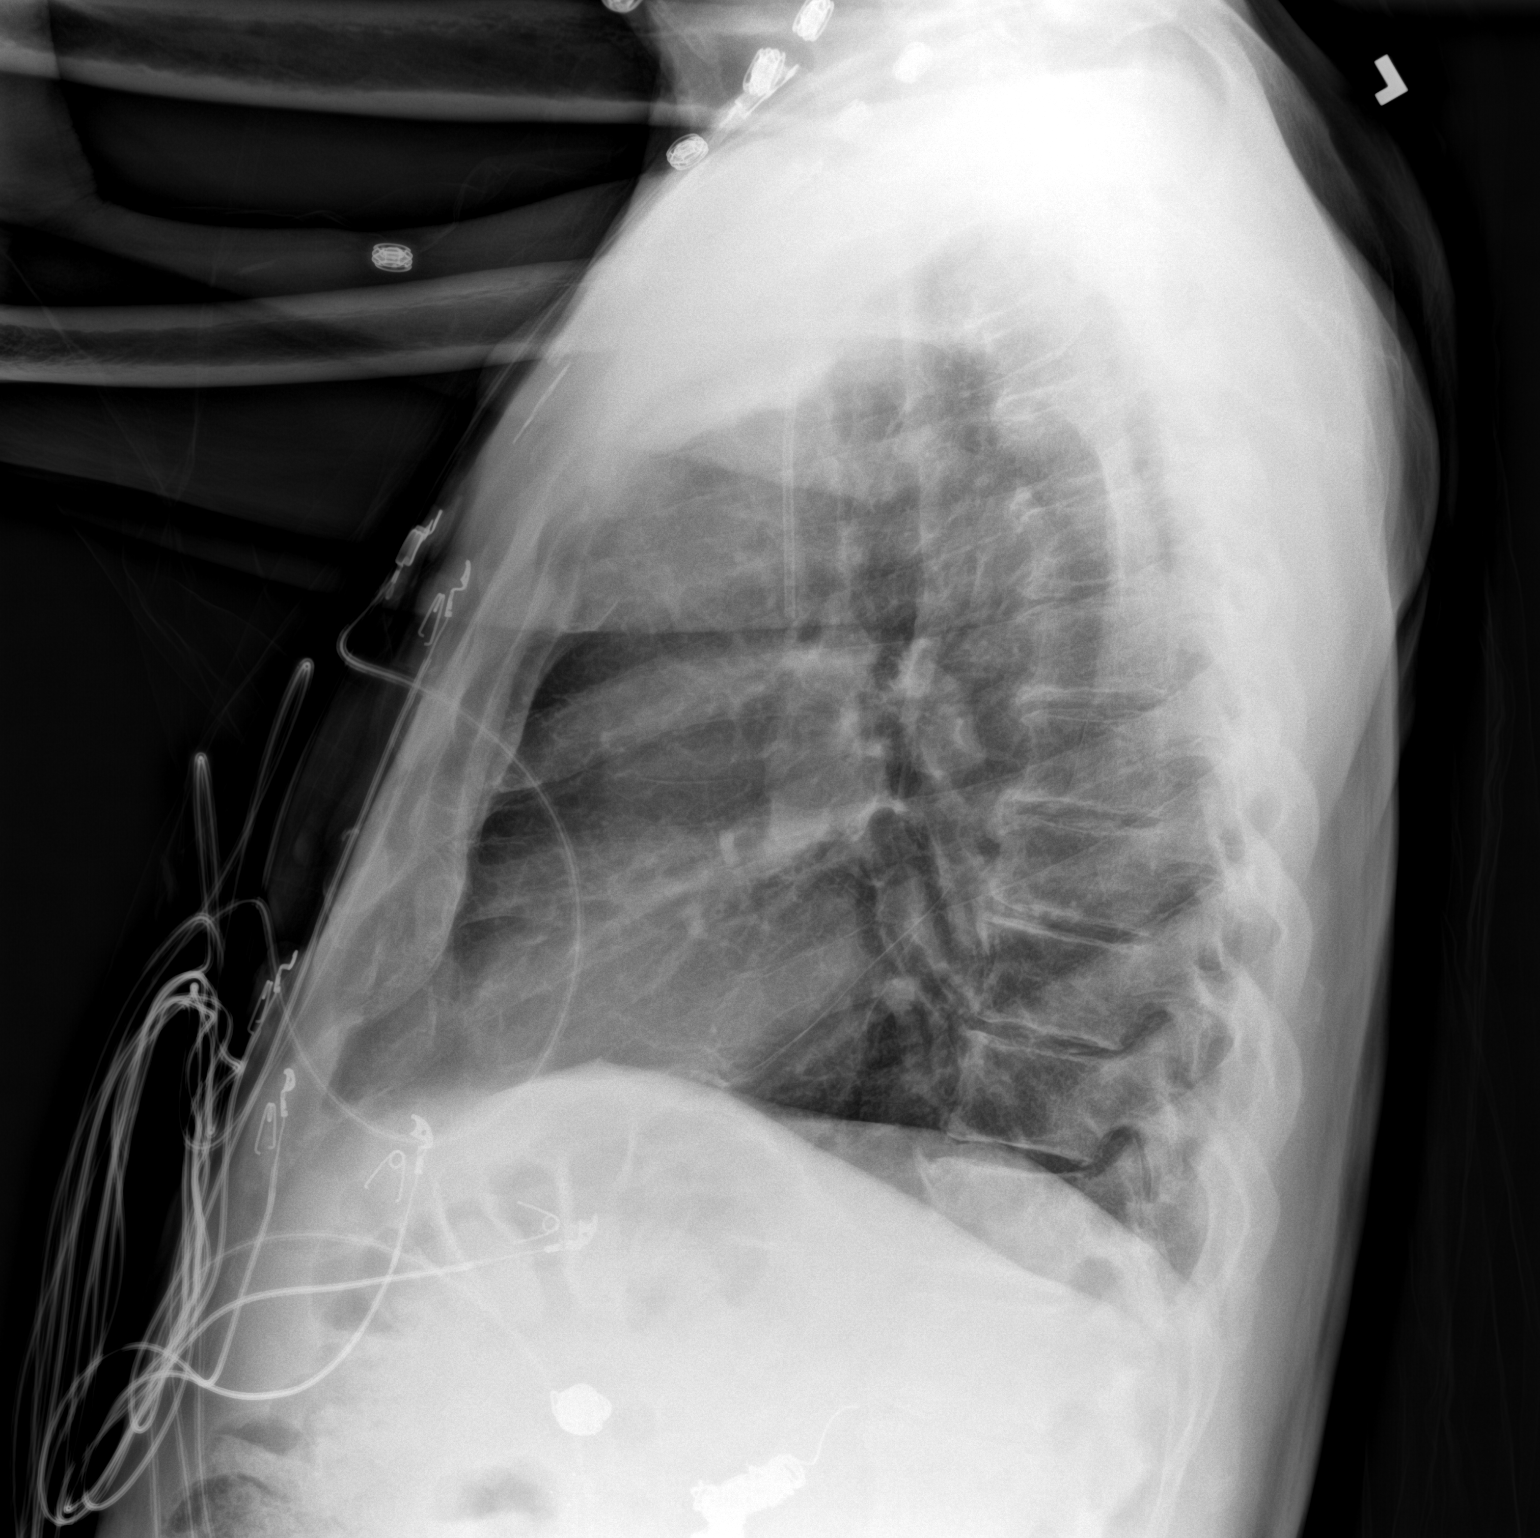

[2 of 2 positions shown; findings below may reference images not displayed]

FINDINGS: A right chest infusion port catheter is present with tip projecting
at the level of the lower SVC. Heart size within normal limits. No
appreciable airspace consolidation or pulmonary edema. No evidence
of pleural effusion or pneumothorax. No acute bony abnormality
identified. Degenerative changes of the spine. Redemonstrated
metallic coils within the right upper abdomen.
IMPRESSION: No evidence of acute cardiopulmonary abnormality.
